# Patient Record
Sex: Male | Born: 1940 | Race: White | Hispanic: No | Marital: Married | Smoking: Former smoker
Health system: Southern US, Community
[De-identification: ages and names within clinical notes are randomized; demographics above are authoritative.]

## PROBLEM LIST (undated history)

## (undated) DIAGNOSIS — E785 Hyperlipidemia, unspecified: Secondary | ICD-10-CM

## (undated) DIAGNOSIS — E039 Hypothyroidism, unspecified: Secondary | ICD-10-CM

## (undated) DIAGNOSIS — J189 Pneumonia, unspecified organism: Secondary | ICD-10-CM

## (undated) DIAGNOSIS — D472 Monoclonal gammopathy: Secondary | ICD-10-CM

## (undated) DIAGNOSIS — R0600 Dyspnea, unspecified: Secondary | ICD-10-CM

## (undated) DIAGNOSIS — K219 Gastro-esophageal reflux disease without esophagitis: Secondary | ICD-10-CM

## (undated) DIAGNOSIS — I10 Essential (primary) hypertension: Secondary | ICD-10-CM

## (undated) DIAGNOSIS — N189 Chronic kidney disease, unspecified: Secondary | ICD-10-CM

## (undated) DIAGNOSIS — I2510 Atherosclerotic heart disease of native coronary artery without angina pectoris: Secondary | ICD-10-CM

## (undated) DIAGNOSIS — I739 Peripheral vascular disease, unspecified: Secondary | ICD-10-CM

## (undated) DIAGNOSIS — K635 Polyp of colon: Secondary | ICD-10-CM

## (undated) DIAGNOSIS — D649 Anemia, unspecified: Secondary | ICD-10-CM

## (undated) DIAGNOSIS — C801 Malignant (primary) neoplasm, unspecified: Secondary | ICD-10-CM

## (undated) DIAGNOSIS — U071 COVID-19: Secondary | ICD-10-CM

## (undated) DIAGNOSIS — I209 Angina pectoris, unspecified: Secondary | ICD-10-CM

## (undated) DIAGNOSIS — M1990 Unspecified osteoarthritis, unspecified site: Secondary | ICD-10-CM

## (undated) HISTORY — DX: Anemia, unspecified: D64.9

## (undated) HISTORY — DX: Peripheral vascular disease, unspecified: I73.9

## (undated) HISTORY — DX: Essential (primary) hypertension: I10

## (undated) HISTORY — DX: Hypercalcemia: E83.52

## (undated) HISTORY — DX: Polyp of colon: K63.5

## (undated) HISTORY — DX: Hyperlipidemia, unspecified: E78.5

## (undated) HISTORY — DX: Unspecified osteoarthritis, unspecified site: M19.90

## (undated) HISTORY — PX: VASCULAR SURGERY: SHX849

## (undated) HISTORY — DX: COVID-19: U07.1

## (undated) HISTORY — PX: BYPASS GRAFT: SHX909

## (undated) HISTORY — DX: Chronic kidney disease, unspecified: N18.9

## (undated) HISTORY — DX: Monoclonal gammopathy: D47.2

## (undated) HISTORY — PX: OTHER SURGICAL HISTORY: SHX169

## (undated) SURGERY — Surgical Case
Anesthesia: *Unknown

---

## 2004-07-13 ENCOUNTER — Ambulatory Visit: Payer: Self-pay

## 2006-03-29 ENCOUNTER — Ambulatory Visit: Payer: Self-pay | Admitting: Gastroenterology

## 2006-05-09 ENCOUNTER — Ambulatory Visit: Payer: Self-pay | Admitting: Gastroenterology

## 2006-06-18 ENCOUNTER — Ambulatory Visit (HOSPITAL_COMMUNITY): Admission: RE | Admit: 2006-06-18 | Discharge: 2006-06-18 | Payer: Self-pay | Admitting: *Deleted

## 2006-07-31 ENCOUNTER — Inpatient Hospital Stay: Payer: Self-pay | Admitting: Vascular Surgery

## 2006-12-25 ENCOUNTER — Inpatient Hospital Stay: Payer: Self-pay | Admitting: Vascular Surgery

## 2007-06-19 ENCOUNTER — Ambulatory Visit: Payer: Self-pay | Admitting: Gastroenterology

## 2007-08-20 ENCOUNTER — Ambulatory Visit: Payer: Self-pay | Admitting: Vascular Surgery

## 2007-11-26 ENCOUNTER — Inpatient Hospital Stay: Payer: Self-pay | Admitting: Vascular Surgery

## 2008-03-18 ENCOUNTER — Ambulatory Visit: Payer: Self-pay | Admitting: Vascular Surgery

## 2008-03-25 ENCOUNTER — Inpatient Hospital Stay: Payer: Self-pay | Admitting: Vascular Surgery

## 2008-05-12 DIAGNOSIS — R351 Nocturia: Secondary | ICD-10-CM | POA: Insufficient documentation

## 2008-05-12 DIAGNOSIS — R3911 Hesitancy of micturition: Secondary | ICD-10-CM | POA: Insufficient documentation

## 2008-05-20 ENCOUNTER — Ambulatory Visit: Payer: Self-pay | Admitting: Gastroenterology

## 2008-05-25 ENCOUNTER — Observation Stay: Payer: Self-pay | Admitting: Gastroenterology

## 2008-06-17 ENCOUNTER — Ambulatory Visit: Payer: Self-pay | Admitting: Gastroenterology

## 2009-06-25 ENCOUNTER — Ambulatory Visit: Payer: Self-pay | Admitting: Family Medicine

## 2009-09-20 ENCOUNTER — Ambulatory Visit: Payer: Self-pay | Admitting: Gastroenterology

## 2010-01-02 ENCOUNTER — Ambulatory Visit: Payer: Self-pay | Admitting: Internal Medicine

## 2010-01-11 ENCOUNTER — Ambulatory Visit: Payer: Self-pay | Admitting: Internal Medicine

## 2010-02-02 ENCOUNTER — Ambulatory Visit: Payer: Self-pay | Admitting: Internal Medicine

## 2010-02-14 DIAGNOSIS — R6882 Decreased libido: Secondary | ICD-10-CM | POA: Insufficient documentation

## 2010-09-09 ENCOUNTER — Ambulatory Visit: Payer: BLUE CROSS/BLUE SHIELD

## 2011-01-26 ENCOUNTER — Ambulatory Visit (INDEPENDENT_AMBULATORY_CARE_PROVIDER_SITE_OTHER): Payer: Medicare Other | Admitting: Family Medicine

## 2011-01-26 ENCOUNTER — Encounter: Payer: Self-pay | Admitting: Family Medicine

## 2011-01-26 VITALS — BP 148/74 | HR 74 | Temp 98.2°F | Ht 68.0 in | Wt 155.0 lb

## 2011-01-26 DIAGNOSIS — E7800 Pure hypercholesterolemia, unspecified: Secondary | ICD-10-CM

## 2011-01-26 DIAGNOSIS — I739 Peripheral vascular disease, unspecified: Secondary | ICD-10-CM

## 2011-01-26 DIAGNOSIS — I10 Essential (primary) hypertension: Secondary | ICD-10-CM

## 2011-01-26 DIAGNOSIS — Z95828 Presence of other vascular implants and grafts: Secondary | ICD-10-CM

## 2011-01-26 DIAGNOSIS — D472 Monoclonal gammopathy: Secondary | ICD-10-CM

## 2011-01-26 DIAGNOSIS — Z9889 Other specified postprocedural states: Secondary | ICD-10-CM

## 2011-01-26 DIAGNOSIS — M549 Dorsalgia, unspecified: Secondary | ICD-10-CM

## 2011-01-26 MED ORDER — TRAMADOL HCL 50 MG PO TABS: 50.0000 mg | ORAL_TABLET | Freq: Three times a day (TID) | ORAL | Status: DC | PRN

## 2011-01-26 MED ORDER — FENOFIBRATE 160 MG PO TABS: 160.0000 mg | ORAL_TABLET | Freq: Every day | ORAL | Status: DC

## 2011-01-26 MED ORDER — LISINOPRIL 40 MG PO TABS: 40.0000 mg | ORAL_TABLET | Freq: Every day | ORAL | Status: DC

## 2011-01-26 MED ORDER — AMLODIPINE BESYLATE 5 MG PO TABS: 5.0000 mg | ORAL_TABLET | Freq: Every day | ORAL | Status: DC

## 2011-01-26 MED ORDER — CLOPIDOGREL BISULFATE 75 MG PO TABS: 75.0000 mg | ORAL_TABLET | Freq: Every day | ORAL | Status: DC

## 2011-01-26 NOTE — Patient Instructions (Signed)
Don't change your meds.  I sent your prescriptions to the pharmacy.  Ask [place] about your previous vaccines.   We'll request your other records.  Schedule a fasting lab visit in the near future.  When you get your labs done, you can get your results through our phone system.  Follow the instructions on the blue card. Take care.

## 2011-01-27 ENCOUNTER — Encounter: Payer: Self-pay | Admitting: Family Medicine

## 2011-01-27 NOTE — Assessment & Plan Note (Signed)
As above.

## 2011-01-27 NOTE — Assessment & Plan Note (Signed)
Stable with tramadol.

## 2011-01-27 NOTE — Progress Notes (Signed)
New pt to est care.    Hypertension:    Using medication without problems or lightheadedness: yes Chest pain with exertion:no Edema:minimal, stable and unchanged Short of breath:no  PVD s/p stent and bypass.  No claudication.  No ulceration.  Minimal edema, chronic.   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  S/p PT with improvement.    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  Thought to be insignificant.   Renal function followed by [facility].   Requesting records from mult clinics.   Meds, vitals, and allergies reviewed.   PMH and SH reviewed  ROS: See HPI.  Otherwise negative.    GEN: nad, alert and oriented HEENT: mucous membranes moist NECK: supple w/o LA CV: rrr. PULM: ctab, no inc wob ABD: soft, +bs EXT: trace edema SKIN: no acute rash

## 2011-01-27 NOTE — Assessment & Plan Note (Signed)
No skin breakdown and 1+ DP pulses.  Requesting records.

## 2011-01-27 NOTE — Assessment & Plan Note (Signed)
Per [facility] clinic.  

## 2011-01-27 NOTE — Assessment & Plan Note (Signed)
Requesting records. Return for labs.  BP okay for now.

## 2011-01-27 NOTE — Assessment & Plan Note (Signed)
Requesting records. Return for labs.

## 2011-02-01 ENCOUNTER — Other Ambulatory Visit (INDEPENDENT_AMBULATORY_CARE_PROVIDER_SITE_OTHER): Payer: Medicare Other | Admitting: Family Medicine

## 2011-02-01 ENCOUNTER — Encounter: Payer: Self-pay | Admitting: Family Medicine

## 2011-02-01 DIAGNOSIS — I10 Essential (primary) hypertension: Secondary | ICD-10-CM

## 2011-02-01 LAB — COMPREHENSIVE METABOLIC PANEL
ALT: 9 U/L (ref 0–53)
AST: 16 U/L (ref 0–37)
Albumin: 3.9 g/dL (ref 3.5–5.2)
BUN: 28 mg/dL — ABNORMAL HIGH (ref 6–23)
Calcium: 9.6 mg/dL (ref 8.4–10.5)
Chloride: 107 mEq/L (ref 96–112)
Potassium: 4.8 mEq/L (ref 3.5–5.1)
Sodium: 137 mEq/L (ref 135–145)
Total Protein: 6.5 g/dL (ref 6.0–8.3)

## 2011-02-01 LAB — LIPID PANEL
LDL Cholesterol: 111 mg/dL — ABNORMAL HIGH (ref 0–99)
Total CHOL/HDL Ratio: 3

## 2011-02-13 ENCOUNTER — Encounter: Payer: Self-pay | Admitting: Family Medicine

## 2011-02-13 DIAGNOSIS — K269 Duodenal ulcer, unspecified as acute or chronic, without hemorrhage or perforation: Secondary | ICD-10-CM | POA: Insufficient documentation

## 2011-02-13 DIAGNOSIS — N529 Male erectile dysfunction, unspecified: Secondary | ICD-10-CM | POA: Insufficient documentation

## 2011-02-13 DIAGNOSIS — M1990 Unspecified osteoarthritis, unspecified site: Secondary | ICD-10-CM | POA: Insufficient documentation

## 2011-02-13 DIAGNOSIS — K5790 Diverticulosis of intestine, part unspecified, without perforation or abscess without bleeding: Secondary | ICD-10-CM | POA: Insufficient documentation

## 2011-02-13 DIAGNOSIS — N183 Chronic kidney disease, stage 3 unspecified: Secondary | ICD-10-CM | POA: Insufficient documentation

## 2011-02-13 DIAGNOSIS — I6529 Occlusion and stenosis of unspecified carotid artery: Secondary | ICD-10-CM | POA: Insufficient documentation

## 2011-03-09 ENCOUNTER — Encounter: Payer: Self-pay | Admitting: Family Medicine

## 2011-03-15 DIAGNOSIS — R350 Frequency of micturition: Secondary | ICD-10-CM | POA: Insufficient documentation

## 2011-04-26 ENCOUNTER — Encounter: Payer: Self-pay | Admitting: Family Medicine

## 2011-04-26 ENCOUNTER — Ambulatory Visit (INDEPENDENT_AMBULATORY_CARE_PROVIDER_SITE_OTHER): Payer: Medicare Other | Admitting: Family Medicine

## 2011-04-26 VITALS — BP 126/60 | HR 84 | Temp 98.1°F | Wt 154.0 lb

## 2011-04-26 DIAGNOSIS — I10 Essential (primary) hypertension: Secondary | ICD-10-CM

## 2011-04-26 DIAGNOSIS — E7800 Pure hypercholesterolemia, unspecified: Secondary | ICD-10-CM

## 2011-04-26 DIAGNOSIS — J069 Acute upper respiratory infection, unspecified: Secondary | ICD-10-CM | POA: Insufficient documentation

## 2011-04-26 MED ORDER — BENZONATATE 200 MG PO CAPS: 200.0000 mg | ORAL_CAPSULE | Freq: Three times a day (TID) | ORAL | Status: AC | PRN

## 2011-04-26 NOTE — Assessment & Plan Note (Signed)
Likely viral and benign exam.  Nontoxic.  Supportive tx and f/u prn.  ddx d/w pt.

## 2011-04-26 NOTE — Assessment & Plan Note (Signed)
Controlled , no change in meds 

## 2011-04-26 NOTE — Progress Notes (Signed)
Hypertension:    Using medication without problems or lightheadedness: yes Chest pain with exertion:no Edema:no Short of breath:no  CKD per renal.  Recent labs noted.   Occ claudication in R calf.  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  He can walk up to , sometimes less. Pain resolved with rest and then he can restart.  No recent change in sx.   URI sx last few days.  Occ aches and cough, but no fevers.  Didn't sleep well last night.  Sx started recently.  Mild throat irritation and tickle in throat.    Meds, vitals, and allergies reviewed.   PMH and SH reviewed  ROS: See HPI.  Otherwise negative.    GEN: nad, alert and oriented HEENT: mucous membranes moist, tm w/o erythema, nasal exam w/o erythema, clear discharge noted,  OP with cobblestoning NECK: supple w/o LA CV: rrr.   PULM: ctab, no inc wob EXT: no edema SKIN: no acute rash

## 2011-04-26 NOTE — Patient Instructions (Signed)
Don't change your regular meds for now.  Drink plenty of fluids, take tylenol as needed, and gargle with warm salt water for your throat.  This should gradually improve.  Take care.  Let us know if you have other concerns.  You can take tessalon for the cough.   30 min visit in 12/12 with labs ahead of time.

## 2011-04-28 ENCOUNTER — Encounter: Payer: Self-pay | Admitting: Family Medicine

## 2011-04-28 DIAGNOSIS — N400 Enlarged prostate without lower urinary tract symptoms: Secondary | ICD-10-CM | POA: Insufficient documentation

## 2011-04-28 DIAGNOSIS — N138 Other obstructive and reflux uropathy: Secondary | ICD-10-CM | POA: Insufficient documentation

## 2011-08-09 ENCOUNTER — Other Ambulatory Visit (INDEPENDENT_AMBULATORY_CARE_PROVIDER_SITE_OTHER): Payer: Medicare Other

## 2011-08-09 DIAGNOSIS — E7800 Pure hypercholesterolemia, unspecified: Secondary | ICD-10-CM

## 2011-08-09 LAB — HEPATIC FUNCTION PANEL
AST: 17 U/L (ref 0–37)
Albumin: 4.2 g/dL (ref 3.5–5.2)
Alkaline Phosphatase: 25 U/L — ABNORMAL LOW (ref 39–117)
Bilirubin, Direct: 0 mg/dL (ref 0.0–0.3)
Total Protein: 7 g/dL (ref 6.0–8.3)

## 2011-08-09 LAB — LIPID PANEL
Cholesterol: 199 mg/dL (ref 0–200)
LDL Cholesterol: 134 mg/dL — ABNORMAL HIGH (ref 0–99)
Triglycerides: 114 mg/dL (ref 0.0–149.0)

## 2011-08-14 ENCOUNTER — Encounter: Payer: Self-pay | Admitting: Family Medicine

## 2011-08-14 ENCOUNTER — Ambulatory Visit (INDEPENDENT_AMBULATORY_CARE_PROVIDER_SITE_OTHER): Payer: Medicare Other | Admitting: Family Medicine

## 2011-08-14 DIAGNOSIS — I10 Essential (primary) hypertension: Secondary | ICD-10-CM

## 2011-08-14 DIAGNOSIS — I739 Peripheral vascular disease, unspecified: Secondary | ICD-10-CM

## 2011-08-14 DIAGNOSIS — E7800 Pure hypercholesterolemia, unspecified: Secondary | ICD-10-CM

## 2011-08-14 NOTE — Assessment & Plan Note (Signed)
Continue current meds.  We talked about options.  He doesn't want to start statin yet.  LDL need addressing.  Will send him to nutrition and then recheck LDL with next next of labs, ie 2013.  He agrees.  He hasn't had adequate diet/lifestyle trial.

## 2011-08-14 NOTE — Progress Notes (Signed)
Elevated Cholesterol: Using medications without problems:yes Muscle aches: no, except as below Diet compliance: no Exercise: yes, some Other complaints:no His wife doesn't cook and he's eating out.  We talked about diet.   Hypertension:    Using medication without problems or lightheadedness: yes Chest pain with exertion:no Edema:no Short of breath:no Average home BPs: similar to today  PVD with L leg bypass.  We talked about labs today.  Not smoking.  Minimal R calf pain/fatigue with prolonged walking.  Has had f/u with vascular clinic.  No skin breakdown in the legs.   Meds, vitals, and allergies reviewed.   PMH and SH reviewed  ROS: See HPI.  Otherwise negative.    GEN: nad, alert and oriented HEENT: mucous membranes moist NECK: supple w/o LA CV: rrr. PULM: ctab, no inc wob ABD: soft, +bs EXT: no edema SKIN: no acute rash 1+ DP pulse on the L foot, slightly decreased on the R compared to L but intact No skin breakdown, no ulceration on feet

## 2011-08-14 NOTE — Assessment & Plan Note (Signed)
D/w pt about PVD in terms of lipid, BP, tobacco and he understood.  Work on Patent attorney.

## 2011-08-14 NOTE — Assessment & Plan Note (Signed)
Continue current meds 

## 2011-08-14 NOTE — Patient Instructions (Signed)
See Shirlee Limerick about your referral before you leave today.  Check with your insurance before you go to the nutrition visit.   Don't change your meds.  I want to recheck your labs in 6 months at a physical.   If you get a cut/blister or other changes on your feet, then you need to notify a doctor.   Take care.

## 2011-08-23 ENCOUNTER — Ambulatory Visit: Payer: BLUE CROSS/BLUE SHIELD | Admitting: Family Medicine

## 2011-09-05 ENCOUNTER — Ambulatory Visit: Payer: Self-pay | Admitting: Family Medicine

## 2011-09-05 DIAGNOSIS — Z713 Dietary counseling and surveillance: Secondary | ICD-10-CM | POA: Diagnosis not present

## 2011-09-05 DIAGNOSIS — E785 Hyperlipidemia, unspecified: Secondary | ICD-10-CM | POA: Diagnosis not present

## 2011-09-05 DIAGNOSIS — Z7182 Exercise counseling: Secondary | ICD-10-CM | POA: Diagnosis not present

## 2011-10-02 DIAGNOSIS — I10 Essential (primary) hypertension: Secondary | ICD-10-CM | POA: Diagnosis not present

## 2011-10-02 DIAGNOSIS — N183 Chronic kidney disease, stage 3 unspecified: Secondary | ICD-10-CM | POA: Diagnosis not present

## 2011-10-06 ENCOUNTER — Ambulatory Visit: Payer: Self-pay | Admitting: Family Medicine

## 2011-10-09 DIAGNOSIS — N183 Chronic kidney disease, stage 3 unspecified: Secondary | ICD-10-CM | POA: Diagnosis not present

## 2011-10-27 DIAGNOSIS — E785 Hyperlipidemia, unspecified: Secondary | ICD-10-CM | POA: Diagnosis not present

## 2011-10-27 DIAGNOSIS — I701 Atherosclerosis of renal artery: Secondary | ICD-10-CM | POA: Diagnosis not present

## 2011-10-27 DIAGNOSIS — I10 Essential (primary) hypertension: Secondary | ICD-10-CM | POA: Diagnosis not present

## 2011-10-27 DIAGNOSIS — I70219 Atherosclerosis of native arteries of extremities with intermittent claudication, unspecified extremity: Secondary | ICD-10-CM | POA: Diagnosis not present

## 2011-10-30 ENCOUNTER — Telehealth: Payer: Self-pay | Admitting: *Deleted

## 2011-10-30 NOTE — Telephone Encounter (Signed)
Ryan Pittman  [phone].  Patient's cell number.  He is going to see UC today because of his BP being so high but would like to establish with you if possible at the next available time.

## 2011-10-30 NOTE — Telephone Encounter (Signed)
Mr. Dillenbeck is a patient of yours now from [facility]. Mr. Kurtenbach wife called saying that their son has HBP and because he has heard his Dad "sing your praises", he would really like to see you as a NP.  It is the understanding that you are no longer taking NP's at this time.  Since this is a family member of one of your patients, would you be willing to make an exception?

## 2011-10-31 NOTE — Telephone Encounter (Signed)
LMOVM of Mom's cell phone.

## 2011-10-31 NOTE — Telephone Encounter (Signed)
Yes, please.  Get him a OV whenever possible.  Thanks.

## 2012-02-06 ENCOUNTER — Other Ambulatory Visit: Payer: Self-pay | Admitting: *Deleted

## 2012-02-06 MED ORDER — CLOPIDOGREL BISULFATE 75 MG PO TABS: 75.0000 mg | ORAL_TABLET | Freq: Every day | ORAL | Status: DC

## 2012-02-09 DIAGNOSIS — N183 Chronic kidney disease, stage 3 unspecified: Secondary | ICD-10-CM | POA: Diagnosis not present

## 2012-02-09 DIAGNOSIS — N039 Chronic nephritic syndrome with unspecified morphologic changes: Secondary | ICD-10-CM | POA: Diagnosis not present

## 2012-02-09 DIAGNOSIS — D631 Anemia in chronic kidney disease: Secondary | ICD-10-CM | POA: Diagnosis not present

## 2012-02-09 DIAGNOSIS — I10 Essential (primary) hypertension: Secondary | ICD-10-CM | POA: Diagnosis not present

## 2012-02-09 DIAGNOSIS — D472 Monoclonal gammopathy: Secondary | ICD-10-CM | POA: Diagnosis not present

## 2012-02-12 ENCOUNTER — Encounter: Payer: Self-pay | Admitting: Family Medicine

## 2012-02-19 ENCOUNTER — Other Ambulatory Visit: Payer: Self-pay | Admitting: Family Medicine

## 2012-02-27 ENCOUNTER — Other Ambulatory Visit: Payer: Self-pay | Admitting: Family Medicine

## 2012-02-27 DIAGNOSIS — Z85828 Personal history of other malignant neoplasm of skin: Secondary | ICD-10-CM | POA: Diagnosis not present

## 2012-02-27 DIAGNOSIS — D239 Other benign neoplasm of skin, unspecified: Secondary | ICD-10-CM | POA: Diagnosis not present

## 2012-03-01 ENCOUNTER — Other Ambulatory Visit: Payer: Self-pay | Admitting: Family Medicine

## 2012-03-01 DIAGNOSIS — I10 Essential (primary) hypertension: Secondary | ICD-10-CM

## 2012-03-04 ENCOUNTER — Other Ambulatory Visit: Payer: Self-pay | Admitting: Family Medicine

## 2012-03-04 NOTE — Telephone Encounter (Signed)
Electronic refill request.  Please advise. 

## 2012-03-04 NOTE — Telephone Encounter (Signed)
Sent!

## 2012-03-05 ENCOUNTER — Other Ambulatory Visit (INDEPENDENT_AMBULATORY_CARE_PROVIDER_SITE_OTHER): Payer: Medicare Other

## 2012-03-05 DIAGNOSIS — I10 Essential (primary) hypertension: Secondary | ICD-10-CM

## 2012-03-05 LAB — LIPID PANEL
Cholesterol: 215 mg/dL — ABNORMAL HIGH (ref 0–200)
HDL: 53.6 mg/dL (ref >39.00)
Triglycerides: 85 mg/dL (ref 0.0–149.0)

## 2012-03-05 LAB — LDL CHOLESTEROL, DIRECT: Direct LDL: 130.1 mg/dL

## 2012-03-05 LAB — COMPREHENSIVE METABOLIC PANEL
Alkaline Phosphatase: 26 U/L — ABNORMAL LOW (ref 39–117)
BUN: 25 mg/dL — ABNORMAL HIGH (ref 6–23)
Creatinine, Ser: 1.4 mg/dL (ref 0.4–1.5)
GFR: 53.24 mL/min — ABNORMAL LOW (ref >60.00)
Glucose, Bld: 88 mg/dL (ref 70–99)
Sodium: 143 mEq/L (ref 135–145)
Total Bilirubin: 0.6 mg/dL (ref 0.3–1.2)
Total Protein: 6.9 g/dL (ref 6.0–8.3)

## 2012-03-12 ENCOUNTER — Encounter: Payer: Self-pay | Admitting: Family Medicine

## 2012-03-12 ENCOUNTER — Ambulatory Visit (INDEPENDENT_AMBULATORY_CARE_PROVIDER_SITE_OTHER): Payer: Medicare Other | Admitting: Family Medicine

## 2012-03-12 VITALS — BP 126/66 | HR 68 | Temp 98.5°F | Ht 65.75 in | Wt 146.8 lb

## 2012-03-12 DIAGNOSIS — Z0000 Encounter for general adult medical examination without abnormal findings: Secondary | ICD-10-CM | POA: Diagnosis not present

## 2012-03-12 DIAGNOSIS — E7800 Pure hypercholesterolemia, unspecified: Secondary | ICD-10-CM | POA: Diagnosis not present

## 2012-03-12 DIAGNOSIS — Z23 Encounter for immunization: Secondary | ICD-10-CM

## 2012-03-12 DIAGNOSIS — I10 Essential (primary) hypertension: Secondary | ICD-10-CM | POA: Diagnosis not present

## 2012-03-12 DIAGNOSIS — I739 Peripheral vascular disease, unspecified: Secondary | ICD-10-CM | POA: Diagnosis not present

## 2012-03-12 MED ORDER — TRAMADOL HCL 50 MG PO TABS: ORAL_TABLET | ORAL | Status: DC

## 2012-03-12 MED ORDER — LISINOPRIL 40 MG PO TABS: 40.0000 mg | ORAL_TABLET | Freq: Every day | ORAL | Status: DC

## 2012-03-12 MED ORDER — CLOPIDOGREL BISULFATE 75 MG PO TABS: 75.0000 mg | ORAL_TABLET | Freq: Every day | ORAL | Status: DC

## 2012-03-12 MED ORDER — AMLODIPINE BESYLATE 5 MG PO TABS: 5.0000 mg | ORAL_TABLET | Freq: Every day | ORAL | Status: DC

## 2012-03-12 MED ORDER — FENOFIBRATE 160 MG PO TABS: 160.0000 mg | ORAL_TABLET | Freq: Every day | ORAL | Status: DC

## 2012-03-12 NOTE — Assessment & Plan Note (Signed)
D/w pt, continue current meds, continue exercise and diet.  

## 2012-03-12 NOTE — Addendum Note (Signed)
Addended by: Sydell Axon C on: 03/12/2012 11:29 AM   Modules accepted: Orders

## 2012-03-12 NOTE — Assessment & Plan Note (Signed)
Dec in claudication, inc in walking distance.  D/w pt, continue current meds, continue exercise and diet.  D/w pt about cessation of tobacco.

## 2012-03-12 NOTE — Assessment & Plan Note (Signed)
D/w pt, continue current meds, continue exercise and diet.

## 2012-03-12 NOTE — Progress Notes (Signed)
I have personally reviewed the Medicare Annual Wellness questionnaire and have noted 1. The patient's medical and social history 2. Their use of alcohol, tobacco or illicit drugs 3. Their current medications and supplements 4. The patient's functional ability including ADL's, fall risks, home safety risks and hearing or visual             impairment. 5. Diet and physical activities 6. Evidence for depression or mood disorders  The patients weight, height, BMI have been recorded in the chart and visual acuity is per eye clinic.  I have made referrals, counseling and provided education to the patient based review of the above and I have provided the pt with a written personalized care plan for preventive services.  See scanned forms.  Routine anticipatory guidance given to patient.  See health maintenance. Tetanus 2013 Flu yearly Shingles 2011 PNA 2011 Colonoscopy 2011 Prostate cancer screening per uro.   Advance directive- he'll check on this.  He thinks he has one.    Hypertension:    Using medication without problems or lightheadedness: yes Chest pain with exertion:no Edema:no Short of breath:no  Elevated Cholesterol: Using medications without problems:yes Muscle aches: no Diet compliance:yes Exercise:yes  PVD.  Still with occ tobacco use.  No leg pain.   Improved claudication- his walking distance is much improved.    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  No FCNAVD.  No rash.    PMH and SH reviewed  Meds, vitals, and allergies reviewed.   ROS: See HPI.  Otherwise negative.    GEN: nad, alert and oriented HEENT: mucous membranes moist NECK: supple w/o LA CV: rrr. PULM: ctab, no inc wob ABD: soft, +bs EXT: no edema, 1+ DP pulses SKIN: no acute rash

## 2012-03-12 NOTE — Patient Instructions (Addendum)
Don't change your meds.  Take care.  Glad to see you.  I would cut out all tobacco use.  I would get a flu shot each fall.

## 2012-04-25 ENCOUNTER — Telehealth: Payer: Self-pay

## 2012-04-25 DIAGNOSIS — M79606 Pain in leg, unspecified: Secondary | ICD-10-CM

## 2012-04-25 NOTE — Telephone Encounter (Signed)
Pt had been seeing Dr Kathi Ludwig about knee pain; no longer practicing. Pt needs referral to [facility] pain clinic Dr Eino Farber for right knee pain.Please advise.

## 2012-04-25 NOTE — Telephone Encounter (Signed)
Referral placed.  I believe that would be Dr. Laban Emperor.

## 2012-04-26 DIAGNOSIS — H2510 Age-related nuclear cataract, unspecified eye: Secondary | ICD-10-CM | POA: Diagnosis not present

## 2012-04-29 ENCOUNTER — Telehealth: Payer: Self-pay | Admitting: *Deleted

## 2012-04-29 NOTE — Telephone Encounter (Signed)
Error

## 2012-04-30 DIAGNOSIS — N401 Enlarged prostate with lower urinary tract symptoms: Secondary | ICD-10-CM | POA: Diagnosis not present

## 2012-04-30 DIAGNOSIS — N138 Other obstructive and reflux uropathy: Secondary | ICD-10-CM | POA: Diagnosis not present

## 2012-04-30 DIAGNOSIS — R351 Nocturia: Secondary | ICD-10-CM | POA: Diagnosis not present

## 2012-04-30 DIAGNOSIS — R350 Frequency of micturition: Secondary | ICD-10-CM | POA: Diagnosis not present

## 2012-05-07 ENCOUNTER — Encounter: Payer: Self-pay | Admitting: Family Medicine

## 2012-05-07 ENCOUNTER — Ambulatory Visit (INDEPENDENT_AMBULATORY_CARE_PROVIDER_SITE_OTHER): Payer: Medicare Other | Admitting: Family Medicine

## 2012-05-07 ENCOUNTER — Telehealth: Payer: Self-pay

## 2012-05-07 VITALS — BP 120/60 | HR 84 | Temp 99.0°F | Wt 149.0 lb

## 2012-05-07 DIAGNOSIS — M1990 Unspecified osteoarthritis, unspecified site: Secondary | ICD-10-CM

## 2012-05-07 MED ORDER — HYDROCODONE-ACETAMINOPHEN 5-325 MG PO TABS: 1.0000 | ORAL_TABLET | Freq: Every evening | ORAL | Status: AC | PRN

## 2012-05-07 NOTE — Telephone Encounter (Signed)
[employer] called status of form; was faxed late 05/03/12.

## 2012-05-07 NOTE — Progress Notes (Signed)
His R knee is continuing to hurt.  Popping with walking and pain at night (with repositioning in the bed).  Prev injected, last injection was months ago.  He's had at least 2, likely 3 injections in his R knee.  He wanted to cancel the pain clinic eval; discussed and he'll cancel this.  H/o GIB on nsaids.  No h/o sig constipation.    Recently with cough and congestion.  No fevers.  Some sputum.    Meds, vitals, and allergies reviewed.   ROS: See HPI.  Otherwise, noncontributory.  GEN: nad, alert and oriented HEENT: mucous membranes moist, tm w/o erythema, nasal exam w/o erythema, clear discharge noted,  OP with cobblestoning NECK: supple w/o LA CV: rrr.   PULM: ctab, no inc wob EXT: no edema SKIN: no acute rash R knee with medial joint line pain but no erythema or effusion. No clicking or locking on ROM.  No laxity on testing.

## 2012-05-07 NOTE — Telephone Encounter (Signed)
pts daughter,Kim left v/m pt in extreme pain and having difficulty walking due to knee pain. Pt has not heard from referral to pain clinic and needs to see someone today; pt is willing to go to [facility] if necessary.Please advise.

## 2012-05-07 NOTE — Telephone Encounter (Signed)
This note was for another pt. Please ignore.

## 2012-05-07 NOTE — Patient Instructions (Addendum)
Go see Shirlee Limerick and get the phone number to call and cancel the pain clinic visit.  Take the vicodin at night.  It can make you drowsy.  If you have any constipation, start taking a stool softener or laxative with it.  Take care.  If not improved, then we'll set you up with ortho.

## 2012-05-07 NOTE — Telephone Encounter (Signed)
Will see pt today.  

## 2012-05-07 NOTE — Telephone Encounter (Signed)
Ryan Pittman advised to send to PCP; Ryan Pittman has spoken previously with pt and explained can take weeks to get appt with pain clinic.Please advise.

## 2012-05-08 NOTE — Assessment & Plan Note (Signed)
With medial knee pain.  D/w pt about options.  Avoid nsaids.  Night pain is bothersome, disrupts sleep.  Use vicodin with sedation/constipation caution at night.  If worsening, we'll set up ortho eval.  He agrees.  Also with benign URI exam that should self resolve,  Likely viral.  Discussed.

## 2012-05-10 DIAGNOSIS — I70229 Atherosclerosis of native arteries of extremities with rest pain, unspecified extremity: Secondary | ICD-10-CM | POA: Diagnosis not present

## 2012-05-10 DIAGNOSIS — I10 Essential (primary) hypertension: Secondary | ICD-10-CM | POA: Diagnosis not present

## 2012-05-10 DIAGNOSIS — I70219 Atherosclerosis of native arteries of extremities with intermittent claudication, unspecified extremity: Secondary | ICD-10-CM | POA: Diagnosis not present

## 2012-05-10 DIAGNOSIS — I701 Atherosclerosis of renal artery: Secondary | ICD-10-CM | POA: Diagnosis not present

## 2012-05-31 DIAGNOSIS — I10 Essential (primary) hypertension: Secondary | ICD-10-CM | POA: Diagnosis not present

## 2012-05-31 DIAGNOSIS — N039 Chronic nephritic syndrome with unspecified morphologic changes: Secondary | ICD-10-CM | POA: Diagnosis not present

## 2012-05-31 DIAGNOSIS — D472 Monoclonal gammopathy: Secondary | ICD-10-CM | POA: Diagnosis not present

## 2012-05-31 DIAGNOSIS — N183 Chronic kidney disease, stage 3 unspecified: Secondary | ICD-10-CM | POA: Diagnosis not present

## 2012-05-31 DIAGNOSIS — D631 Anemia in chronic kidney disease: Secondary | ICD-10-CM | POA: Diagnosis not present

## 2012-06-03 ENCOUNTER — Encounter: Payer: Self-pay | Admitting: Family Medicine

## 2012-06-11 DIAGNOSIS — Z23 Encounter for immunization: Secondary | ICD-10-CM | POA: Diagnosis not present

## 2012-06-12 DIAGNOSIS — N529 Male erectile dysfunction, unspecified: Secondary | ICD-10-CM | POA: Diagnosis not present

## 2012-07-09 ENCOUNTER — Ambulatory Visit: Payer: Self-pay | Admitting: Nephrology

## 2012-08-13 DIAGNOSIS — D472 Monoclonal gammopathy: Secondary | ICD-10-CM | POA: Diagnosis not present

## 2012-10-09 DIAGNOSIS — Z86010 Personal history of colonic polyps: Secondary | ICD-10-CM | POA: Diagnosis not present

## 2012-11-14 DIAGNOSIS — I10 Essential (primary) hypertension: Secondary | ICD-10-CM | POA: Diagnosis not present

## 2012-11-14 DIAGNOSIS — D631 Anemia in chronic kidney disease: Secondary | ICD-10-CM | POA: Diagnosis not present

## 2012-11-14 DIAGNOSIS — N183 Chronic kidney disease, stage 3 unspecified: Secondary | ICD-10-CM | POA: Diagnosis not present

## 2012-11-26 ENCOUNTER — Ambulatory Visit (INDEPENDENT_AMBULATORY_CARE_PROVIDER_SITE_OTHER): Payer: Medicare Other | Admitting: Family Medicine

## 2012-11-26 ENCOUNTER — Encounter: Payer: Self-pay | Admitting: Family Medicine

## 2012-11-26 VITALS — BP 130/60 | HR 96 | Temp 98.8°F | Wt 156.0 lb

## 2012-11-26 DIAGNOSIS — E7800 Pure hypercholesterolemia, unspecified: Secondary | ICD-10-CM | POA: Diagnosis not present

## 2012-11-26 DIAGNOSIS — J069 Acute upper respiratory infection, unspecified: Secondary | ICD-10-CM

## 2012-11-26 MED ORDER — PRAVASTATIN SODIUM 20 MG PO TABS: 20.0000 mg | ORAL_TABLET | Freq: Every day | ORAL | Status: DC

## 2012-11-26 NOTE — Patient Instructions (Addendum)
If the cough gets worse, if you have a lot more sputum, or if you get short of breath, then let me know.  When the cold gets better, add on pravastatin 20mg  a day.  If you aches, cut back to 10mg  a day.  If you can tolerate it, then we'll need to schedule repeat fasting cholesterol testing.

## 2012-11-26 NOTE — Progress Notes (Signed)
Started about 1 week ago with nasal congestion and sneezing.  Taking OTC mucinex w/o any relief.  No fevers.  Some cough, mild.  occ sputum, less now than prev.  B ear pain.  No facial pain.  Overall he thought he was getting better but didn't feel great this AM.  "I think I'm better than I was."  No ST.  Not SOB. No BLE edema.  Mult sick contacts at work.    HLD.  We talked about options.  He would like to try a different statin.  This is reasonable.  See plan.    Meds, vitals, and allergies reviewed.   ROS: See HPI.  Otherwise, noncontributory.  GEN: nad, alert and oriented HEENT: mucous membranes moist, tm w/o erythema, nasal exam w/o erythema, clear discharge noted,  OP with cobblestoning, sinuses not ttp. NECK: supple w/o LA CV: rrr.   PULM: ctab, no inc wob EXT: no edema SKIN: no acute rash

## 2012-11-27 DIAGNOSIS — J069 Acute upper respiratory infection, unspecified: Secondary | ICD-10-CM | POA: Insufficient documentation

## 2012-11-27 NOTE — Assessment & Plan Note (Signed)
Likely viral, nontoxic, fu prn.  Supportive tx.

## 2012-11-27 NOTE — Assessment & Plan Note (Signed)
Will attempt to add on prava.  If aches, but back to 10mg , it still with aches the stop.  If tolerated, he'll notify and we'll set up repeat lipids.

## 2012-11-28 ENCOUNTER — Encounter: Payer: Self-pay | Admitting: Family Medicine

## 2012-11-28 LAB — CHOLESTEROL, TOTAL
Creat: 1.62
Glucose: 92
HDL: 57 mg/dL (ref 35–70)
Triglycerides: 130

## 2012-12-09 ENCOUNTER — Encounter: Payer: Self-pay | Admitting: Family Medicine

## 2012-12-24 DIAGNOSIS — I70219 Atherosclerosis of native arteries of extremities with intermittent claudication, unspecified extremity: Secondary | ICD-10-CM | POA: Diagnosis not present

## 2012-12-24 DIAGNOSIS — I6529 Occlusion and stenosis of unspecified carotid artery: Secondary | ICD-10-CM | POA: Diagnosis not present

## 2012-12-24 DIAGNOSIS — I701 Atherosclerosis of renal artery: Secondary | ICD-10-CM | POA: Diagnosis not present

## 2012-12-24 DIAGNOSIS — I10 Essential (primary) hypertension: Secondary | ICD-10-CM | POA: Diagnosis not present

## 2013-01-02 ENCOUNTER — Encounter: Payer: Self-pay | Admitting: Family Medicine

## 2013-03-06 DIAGNOSIS — I10 Essential (primary) hypertension: Secondary | ICD-10-CM | POA: Diagnosis not present

## 2013-03-06 DIAGNOSIS — N183 Chronic kidney disease, stage 3 unspecified: Secondary | ICD-10-CM | POA: Diagnosis not present

## 2013-03-17 DIAGNOSIS — Z85828 Personal history of other malignant neoplasm of skin: Secondary | ICD-10-CM | POA: Diagnosis not present

## 2013-03-17 DIAGNOSIS — D235 Other benign neoplasm of skin of trunk: Secondary | ICD-10-CM | POA: Diagnosis not present

## 2013-03-17 DIAGNOSIS — L570 Actinic keratosis: Secondary | ICD-10-CM | POA: Diagnosis not present

## 2013-03-28 ENCOUNTER — Other Ambulatory Visit: Payer: Self-pay | Admitting: *Deleted

## 2013-03-28 MED ORDER — FENOFIBRATE 160 MG PO TABS: 160.0000 mg | ORAL_TABLET | Freq: Every day | ORAL | Status: DC

## 2013-03-28 MED ORDER — LISINOPRIL 40 MG PO TABS: 40.0000 mg | ORAL_TABLET | Freq: Every day | ORAL | Status: DC

## 2013-03-28 NOTE — Telephone Encounter (Signed)
Last filled 02/06/13

## 2013-03-30 MED ORDER — TRAMADOL HCL 50 MG PO TABS: ORAL_TABLET | ORAL | Status: DC

## 2013-03-30 NOTE — Telephone Encounter (Signed)
Sent, schedule a physical.  Thanks.

## 2013-03-30 NOTE — Telephone Encounter (Signed)
Correction, please call in tramadol and schedule a physical.  Thanks.

## 2013-03-31 NOTE — Telephone Encounter (Signed)
Medication phoned to pharmacy. Left detailed message on voicemail to schedule CPE. 

## 2013-04-02 ENCOUNTER — Other Ambulatory Visit: Payer: Self-pay | Admitting: Family Medicine

## 2013-04-02 ENCOUNTER — Other Ambulatory Visit (INDEPENDENT_AMBULATORY_CARE_PROVIDER_SITE_OTHER): Payer: Medicare Other

## 2013-04-02 DIAGNOSIS — E7800 Pure hypercholesterolemia, unspecified: Secondary | ICD-10-CM

## 2013-04-02 LAB — COMPREHENSIVE METABOLIC PANEL
ALT: 11 U/L (ref 0–53)
CO2: 25 mEq/L (ref 19–32)
Creatinine, Ser: 1.3 mg/dL (ref 0.4–1.5)
GFR: 57.35 mL/min — ABNORMAL LOW (ref >60.00)
Total Bilirubin: 0.7 mg/dL (ref 0.3–1.2)

## 2013-04-02 LAB — LIPID PANEL
Cholesterol: 216 mg/dL — ABNORMAL HIGH (ref 0–200)
HDL: 44 mg/dL (ref >39.00)
VLDL: 21.6 mg/dL (ref 0.0–40.0)

## 2013-04-08 ENCOUNTER — Encounter: Payer: Medicare Other | Admitting: Family Medicine

## 2013-04-18 ENCOUNTER — Other Ambulatory Visit: Payer: Self-pay | Admitting: Family Medicine

## 2013-04-23 DIAGNOSIS — N401 Enlarged prostate with lower urinary tract symptoms: Secondary | ICD-10-CM | POA: Diagnosis not present

## 2013-04-23 DIAGNOSIS — R351 Nocturia: Secondary | ICD-10-CM | POA: Diagnosis not present

## 2013-04-23 DIAGNOSIS — N138 Other obstructive and reflux uropathy: Secondary | ICD-10-CM | POA: Diagnosis not present

## 2013-04-23 DIAGNOSIS — N529 Male erectile dysfunction, unspecified: Secondary | ICD-10-CM | POA: Diagnosis not present

## 2013-04-28 DIAGNOSIS — H2510 Age-related nuclear cataract, unspecified eye: Secondary | ICD-10-CM | POA: Diagnosis not present

## 2013-04-29 DIAGNOSIS — H5340 Unspecified visual field defects: Secondary | ICD-10-CM | POA: Diagnosis not present

## 2013-04-29 DIAGNOSIS — H02839 Dermatochalasis of unspecified eye, unspecified eyelid: Secondary | ICD-10-CM | POA: Diagnosis not present

## 2013-05-07 ENCOUNTER — Other Ambulatory Visit: Payer: Self-pay | Admitting: Family Medicine

## 2013-05-27 DIAGNOSIS — M79609 Pain in unspecified limb: Secondary | ICD-10-CM | POA: Diagnosis not present

## 2013-05-27 DIAGNOSIS — I70219 Atherosclerosis of native arteries of extremities with intermittent claudication, unspecified extremity: Secondary | ICD-10-CM | POA: Diagnosis not present

## 2013-05-27 DIAGNOSIS — I739 Peripheral vascular disease, unspecified: Secondary | ICD-10-CM | POA: Diagnosis not present

## 2013-05-27 DIAGNOSIS — I701 Atherosclerosis of renal artery: Secondary | ICD-10-CM | POA: Diagnosis not present

## 2013-05-29 DIAGNOSIS — N138 Other obstructive and reflux uropathy: Secondary | ICD-10-CM | POA: Diagnosis not present

## 2013-05-29 DIAGNOSIS — N401 Enlarged prostate with lower urinary tract symptoms: Secondary | ICD-10-CM | POA: Diagnosis not present

## 2013-06-03 DIAGNOSIS — H02419 Mechanical ptosis of unspecified eyelid: Secondary | ICD-10-CM | POA: Diagnosis not present

## 2013-06-17 ENCOUNTER — Encounter: Payer: Self-pay | Admitting: Family Medicine

## 2013-06-17 ENCOUNTER — Ambulatory Visit (INDEPENDENT_AMBULATORY_CARE_PROVIDER_SITE_OTHER): Payer: Medicare Other | Admitting: Family Medicine

## 2013-06-17 VITALS — BP 128/70 | HR 68 | Temp 97.8°F | Ht 65.0 in | Wt 151.0 lb

## 2013-06-17 DIAGNOSIS — Z0000 Encounter for general adult medical examination without abnormal findings: Secondary | ICD-10-CM | POA: Diagnosis not present

## 2013-06-17 DIAGNOSIS — E7800 Pure hypercholesterolemia, unspecified: Secondary | ICD-10-CM

## 2013-06-17 DIAGNOSIS — I10 Essential (primary) hypertension: Secondary | ICD-10-CM

## 2013-06-17 DIAGNOSIS — G4700 Insomnia, unspecified: Secondary | ICD-10-CM

## 2013-06-17 DIAGNOSIS — Z23 Encounter for immunization: Secondary | ICD-10-CM | POA: Diagnosis not present

## 2013-06-17 NOTE — Progress Notes (Signed)
I have personally reviewed the Medicare Annual Wellness questionnaire and have noted 1. The patient's medical and social history 2. Their use of alcohol, tobacco or illicit drugs 3. Their current medications and supplements 4. The patient's functional ability including ADL's, fall risks, home safety risks and hearing or visual             impairment. 5. Diet and physical activities 6. Evidence for depression or mood disorders  The patients weight, height, BMI have been recorded in the chart and visual acuity is per eye clinic.  I have made referrals, counseling and provided education to the patient based review of the above and I have provided the pt with a written personalized care plan for preventive services.  See scanned forms.  Routine anticipatory guidance given to patient.  See health maintenance. Flu to be done at pharmacy Shingles 2013 PNA 2011 Tetanus 2013 Colon 2011 per patient report.  Prostate cancer screening per uro Advance directive encouraged.  Wife designated.  Cognitive function addressed- see scanned forms- and if abnormal then additional documentation follows.   Occ insomnia. Not OTC meds tired.  Caffeine- in AM, 3-4 cups of coffee.  occ in evening.  No sodas.    Elevated Cholesterol: Using medications without problems:t Muscle aches: n Diet compliance:y Exercise:y  Hypertension:    Using medication without problems or lightheadedness: y Chest pain with exertion:n Edema:n Short of breath:n  PMH and SH reviewed  Meds, vitals, and allergies reviewed.   ROS: See HPI.  Otherwise negative.    GEN: nad, alert and oriented HEENT: mucous membranes moist NECK: supple w/o LA CV: rrr. PULM: ctab, no inc wob ABD: soft, +bs EXT: no edema SKIN: no acute rash

## 2013-06-17 NOTE — Patient Instructions (Signed)
Try to cut back on coffee.  If you still have trouble sleeping, try OTC unisom or melatonin.   If not better, then let me know.  Keep taking the pravastatin.  If you have more aches, then let me know.  Glad to see you.  Take care.  Get a flu shot at the pharmacy.

## 2013-06-18 DIAGNOSIS — G4700 Insomnia, unspecified: Secondary | ICD-10-CM | POA: Insufficient documentation

## 2013-06-18 NOTE — Assessment & Plan Note (Signed)
No OTC meds tried yet. Caffeine- in AM, 3-4 cups of coffee. Occ in evening. No sodas. Discussed cutting back caffeine, then trying OTC meds.  Call back as needed.

## 2013-06-18 NOTE — Assessment & Plan Note (Signed)
Labs d/w pt.  Likely on max dose of tolerated statin, continue to work on diet/exercise/weight.

## 2013-06-18 NOTE — Assessment & Plan Note (Signed)
Controlled, continue as is.  

## 2013-06-18 NOTE — Assessment & Plan Note (Signed)
See scanned forms.  Routine anticipatory guidance given to patient.  See health maintenance. Flu to be done at pharmacy Shingles 2013 PNA 2011 Tetanus 2013 Colon 2011 per patient report.  Prostate cancer screening per uro Advance directive encouraged.  Wife designated.  Cognitive function addressed- see scanned forms- and if abnormal then additional documentation follows.

## 2013-06-27 DIAGNOSIS — H5340 Unspecified visual field defects: Secondary | ICD-10-CM | POA: Diagnosis not present

## 2013-06-27 DIAGNOSIS — H02839 Dermatochalasis of unspecified eye, unspecified eyelid: Secondary | ICD-10-CM | POA: Diagnosis not present

## 2013-07-22 DIAGNOSIS — M19049 Primary osteoarthritis, unspecified hand: Secondary | ICD-10-CM | POA: Diagnosis not present

## 2013-07-22 DIAGNOSIS — D472 Monoclonal gammopathy: Secondary | ICD-10-CM | POA: Diagnosis not present

## 2013-07-22 DIAGNOSIS — Z79899 Other long term (current) drug therapy: Secondary | ICD-10-CM | POA: Diagnosis not present

## 2013-07-22 DIAGNOSIS — M479 Spondylosis, unspecified: Secondary | ICD-10-CM | POA: Diagnosis not present

## 2013-07-24 DIAGNOSIS — H2510 Age-related nuclear cataract, unspecified eye: Secondary | ICD-10-CM | POA: Diagnosis not present

## 2013-08-05 ENCOUNTER — Other Ambulatory Visit: Payer: Self-pay | Admitting: Family Medicine

## 2013-08-20 DIAGNOSIS — I10 Essential (primary) hypertension: Secondary | ICD-10-CM | POA: Diagnosis not present

## 2013-08-20 DIAGNOSIS — N183 Chronic kidney disease, stage 3 unspecified: Secondary | ICD-10-CM | POA: Diagnosis not present

## 2013-10-29 ENCOUNTER — Other Ambulatory Visit: Payer: Self-pay | Admitting: Family Medicine

## 2013-10-29 NOTE — Telephone Encounter (Signed)
Last filled 09/17/13

## 2013-10-30 MED ORDER — TRAMADOL HCL 50 MG PO TABS: ORAL_TABLET | ORAL | Status: DC

## 2013-10-30 NOTE — Telephone Encounter (Signed)
Please call in.  Thanks.   

## 2013-10-31 NOTE — Telephone Encounter (Signed)
Medication phoned to pharmacy.  

## 2013-11-03 ENCOUNTER — Other Ambulatory Visit: Payer: Self-pay | Admitting: *Deleted

## 2013-11-03 MED ORDER — TRAMADOL HCL 50 MG PO TABS: ORAL_TABLET | ORAL | Status: DC

## 2013-11-03 NOTE — Telephone Encounter (Signed)
Ok to refill 

## 2013-11-03 NOTE — Telephone Encounter (Signed)
Please call in.  Thanks.   

## 2013-11-04 NOTE — Telephone Encounter (Signed)
Rx called to pharmacy as instructed. 

## 2013-11-14 ENCOUNTER — Encounter: Payer: Self-pay | Admitting: Family Medicine

## 2013-11-14 ENCOUNTER — Ambulatory Visit (INDEPENDENT_AMBULATORY_CARE_PROVIDER_SITE_OTHER): Payer: Medicare Other | Admitting: Family Medicine

## 2013-11-14 VITALS — BP 138/62 | HR 80 | Temp 98.2°F | Wt 153.0 lb

## 2013-11-14 DIAGNOSIS — G2581 Restless legs syndrome: Secondary | ICD-10-CM | POA: Diagnosis not present

## 2013-11-14 DIAGNOSIS — M549 Dorsalgia, unspecified: Secondary | ICD-10-CM

## 2013-11-14 DIAGNOSIS — Z862 Personal history of diseases of the blood and blood-forming organs and certain disorders involving the immune mechanism: Secondary | ICD-10-CM

## 2013-11-14 DIAGNOSIS — E7800 Pure hypercholesterolemia, unspecified: Secondary | ICD-10-CM | POA: Diagnosis not present

## 2013-11-14 DIAGNOSIS — G4700 Insomnia, unspecified: Secondary | ICD-10-CM | POA: Diagnosis not present

## 2013-11-14 LAB — CBC WITH DIFFERENTIAL/PLATELET
BASOS ABS: 0 10*3/uL (ref 0.0–0.1)
Basophils Relative: 0.4 % (ref 0.0–3.0)
EOS ABS: 0.1 10*3/uL (ref 0.0–0.7)
Eosinophils Relative: 0.8 % (ref 0.0–5.0)
HCT: 35.7 % — ABNORMAL LOW (ref 39.0–52.0)
Hemoglobin: 11.9 g/dL — ABNORMAL LOW (ref 13.0–17.0)
LYMPHS PCT: 19.5 % (ref 12.0–46.0)
Lymphs Abs: 2 10*3/uL (ref 0.7–4.0)
MCHC: 33.4 g/dL (ref 30.0–36.0)
MCV: 91.9 fl (ref 78.0–100.0)
Monocytes Absolute: 0.9 10*3/uL (ref 0.1–1.0)
Monocytes Relative: 8.7 % (ref 3.0–12.0)
NEUTROS PCT: 70.6 % (ref 43.0–77.0)
Neutro Abs: 7.3 10*3/uL (ref 1.4–7.7)
PLATELETS: 294 10*3/uL (ref 150.0–400.0)
RBC: 3.89 Mil/uL — ABNORMAL LOW (ref 4.22–5.81)
RDW: 14.9 % — AB (ref 11.5–14.6)
WBC: 10.4 10*3/uL (ref 4.5–10.5)

## 2013-11-14 LAB — COMPREHENSIVE METABOLIC PANEL
ALK PHOS: 27 U/L — AB (ref 39–117)
ALT: 12 U/L (ref 0–53)
AST: 13 U/L (ref 0–37)
Albumin: 4.3 g/dL (ref 3.5–5.2)
BILIRUBIN TOTAL: 0.5 mg/dL (ref 0.3–1.2)
BUN: 24 mg/dL — AB (ref 6–23)
CO2: 26 mEq/L (ref 19–32)
Calcium: 9.5 mg/dL (ref 8.4–10.5)
Chloride: 105 mEq/L (ref 96–112)
Creatinine, Ser: 1.3 mg/dL (ref 0.4–1.5)
GFR: 57.76 mL/min — ABNORMAL LOW (ref >60.00)
GLUCOSE: 89 mg/dL (ref 70–99)
Potassium: 4.4 mEq/L (ref 3.5–5.1)
SODIUM: 137 meq/L (ref 135–145)
TOTAL PROTEIN: 7.1 g/dL (ref 6.0–8.3)

## 2013-11-14 LAB — FERRITIN: Ferritin: 78.7 ng/mL (ref 22.0–322.0)

## 2013-11-14 LAB — LIPID PANEL
CHOLESTEROL: 206 mg/dL — AB (ref 0–200)
HDL: 44.3 mg/dL (ref >39.00)
LDL CALC: 133 mg/dL — AB (ref 0–99)
TRIGLYCERIDES: 142 mg/dL (ref 0.0–149.0)
Total CHOL/HDL Ratio: 5
VLDL: 28.4 mg/dL (ref 0.0–40.0)

## 2013-11-14 LAB — TSH: TSH: 1.43 u[IU]/mL (ref 0.35–5.50)

## 2013-11-14 MED ORDER — HYDROCODONE-ACETAMINOPHEN 5-325 MG PO TABS: 1.0000 | ORAL_TABLET | Freq: Four times a day (QID) | ORAL | Status: DC | PRN

## 2013-11-14 NOTE — Progress Notes (Signed)
Pre visit review using our clinic review tool, if applicable. No additional management support is needed unless otherwise documented below in the visit note.  Back pain worse after consecutive golf rounds.  Tramadol didn't help.  Had used vicodin rarely w/o ADE.  Discussed.  Reasonable to continue prn.  Pain is usually near the B SI joints.   Trouble sleeping, even if tired, legs get jumpy.  No leg pain. This isn't new, worse over the last few years.    Elevated Cholesterol: Using medications without problems:yes Muscle aches: no, other than his back Diet compliance: yes, discussed.  Exercise: limited this winter, he's working on this.   Meds, vitals, and allergies reviewed.   ROS: See HPI.  Otherwise, noncontributory.  GEN: nad, alert and oriented HEENT: mucous membranes moist NECK: supple w/o LA CV: rrr.  PULM: ctab, no inc wob ABD: soft, +bs EXT: no edema SKIN: no acute rash Back not currently ttp, SI joints not ttp

## 2013-11-14 NOTE — Patient Instructions (Signed)
Go to the lab on the way out.  We'll contact you with your lab report.  We'll go from there.  Take care.  Keep stretching your back and use the hydrocodone only if needed.  It can make you drowsy.   Glad to see you.

## 2013-11-16 ENCOUNTER — Encounter: Payer: Self-pay | Admitting: Family Medicine

## 2013-11-16 ENCOUNTER — Other Ambulatory Visit: Payer: Self-pay | Admitting: Family Medicine

## 2013-11-16 DIAGNOSIS — G2581 Restless legs syndrome: Secondary | ICD-10-CM | POA: Insufficient documentation

## 2013-11-16 MED ORDER — ROPINIROLE HCL 0.25 MG PO TABS: 0.2500 mg | ORAL_TABLET | Freq: Every day | ORAL | Status: DC

## 2013-11-16 NOTE — Assessment & Plan Note (Signed)
Is worse after consecutive golf rounds. Tramadol didn't help. Had used vicodin rarely w/o ADE. Discussed. Reasonable to continue prn.

## 2013-11-16 NOTE — Assessment & Plan Note (Signed)
Likely, see notes on labs.

## 2013-11-16 NOTE — Assessment & Plan Note (Signed)
See notes on labs. 

## 2013-11-17 ENCOUNTER — Encounter: Payer: Self-pay | Admitting: *Deleted

## 2013-11-28 DIAGNOSIS — M79609 Pain in unspecified limb: Secondary | ICD-10-CM | POA: Diagnosis not present

## 2013-11-28 DIAGNOSIS — I10 Essential (primary) hypertension: Secondary | ICD-10-CM | POA: Diagnosis not present

## 2013-11-28 DIAGNOSIS — I701 Atherosclerosis of renal artery: Secondary | ICD-10-CM | POA: Diagnosis not present

## 2013-11-28 DIAGNOSIS — I70219 Atherosclerosis of native arteries of extremities with intermittent claudication, unspecified extremity: Secondary | ICD-10-CM | POA: Diagnosis not present

## 2013-12-22 DIAGNOSIS — N039 Chronic nephritic syndrome with unspecified morphologic changes: Secondary | ICD-10-CM | POA: Diagnosis not present

## 2013-12-22 DIAGNOSIS — I10 Essential (primary) hypertension: Secondary | ICD-10-CM | POA: Diagnosis not present

## 2013-12-22 DIAGNOSIS — N182 Chronic kidney disease, stage 2 (mild): Secondary | ICD-10-CM | POA: Diagnosis not present

## 2013-12-22 DIAGNOSIS — D631 Anemia in chronic kidney disease: Secondary | ICD-10-CM | POA: Diagnosis not present

## 2014-01-03 ENCOUNTER — Other Ambulatory Visit: Payer: Self-pay | Admitting: Family Medicine

## 2014-01-05 DIAGNOSIS — I10 Essential (primary) hypertension: Secondary | ICD-10-CM | POA: Diagnosis not present

## 2014-01-05 DIAGNOSIS — I70219 Atherosclerosis of native arteries of extremities with intermittent claudication, unspecified extremity: Secondary | ICD-10-CM | POA: Diagnosis not present

## 2014-01-05 DIAGNOSIS — I6529 Occlusion and stenosis of unspecified carotid artery: Secondary | ICD-10-CM | POA: Diagnosis not present

## 2014-01-05 DIAGNOSIS — M549 Dorsalgia, unspecified: Secondary | ICD-10-CM | POA: Diagnosis not present

## 2014-01-13 DIAGNOSIS — I70219 Atherosclerosis of native arteries of extremities with intermittent claudication, unspecified extremity: Secondary | ICD-10-CM | POA: Diagnosis not present

## 2014-01-13 DIAGNOSIS — I6529 Occlusion and stenosis of unspecified carotid artery: Secondary | ICD-10-CM | POA: Diagnosis not present

## 2014-01-13 DIAGNOSIS — M79609 Pain in unspecified limb: Secondary | ICD-10-CM | POA: Diagnosis not present

## 2014-02-01 DIAGNOSIS — J00 Acute nasopharyngitis [common cold]: Secondary | ICD-10-CM | POA: Diagnosis not present

## 2014-02-01 DIAGNOSIS — J209 Acute bronchitis, unspecified: Secondary | ICD-10-CM | POA: Diagnosis not present

## 2014-02-01 DIAGNOSIS — R059 Cough, unspecified: Secondary | ICD-10-CM | POA: Diagnosis not present

## 2014-02-01 DIAGNOSIS — R05 Cough: Secondary | ICD-10-CM | POA: Diagnosis not present

## 2014-03-11 DIAGNOSIS — Z85828 Personal history of other malignant neoplasm of skin: Secondary | ICD-10-CM | POA: Diagnosis not present

## 2014-03-11 DIAGNOSIS — D235 Other benign neoplasm of skin of trunk: Secondary | ICD-10-CM | POA: Diagnosis not present

## 2014-03-11 DIAGNOSIS — R209 Unspecified disturbances of skin sensation: Secondary | ICD-10-CM | POA: Diagnosis not present

## 2014-04-07 DIAGNOSIS — Z23 Encounter for immunization: Secondary | ICD-10-CM | POA: Diagnosis not present

## 2014-04-08 DIAGNOSIS — M542 Cervicalgia: Secondary | ICD-10-CM | POA: Diagnosis not present

## 2014-04-08 DIAGNOSIS — M5030 Other cervical disc degeneration, unspecified cervical region: Secondary | ICD-10-CM | POA: Diagnosis not present

## 2014-04-08 DIAGNOSIS — M47812 Spondylosis without myelopathy or radiculopathy, cervical region: Secondary | ICD-10-CM | POA: Diagnosis not present

## 2014-04-08 DIAGNOSIS — M159 Polyosteoarthritis, unspecified: Secondary | ICD-10-CM | POA: Diagnosis not present

## 2014-04-10 ENCOUNTER — Other Ambulatory Visit: Payer: Self-pay | Admitting: Family Medicine

## 2014-04-22 DIAGNOSIS — M5030 Other cervical disc degeneration, unspecified cervical region: Secondary | ICD-10-CM | POA: Diagnosis not present

## 2014-04-22 DIAGNOSIS — M542 Cervicalgia: Secondary | ICD-10-CM | POA: Diagnosis not present

## 2014-04-22 DIAGNOSIS — R0602 Shortness of breath: Secondary | ICD-10-CM | POA: Insufficient documentation

## 2014-04-22 DIAGNOSIS — R252 Cramp and spasm: Secondary | ICD-10-CM | POA: Insufficient documentation

## 2014-04-22 DIAGNOSIS — R918 Other nonspecific abnormal finding of lung field: Secondary | ICD-10-CM | POA: Diagnosis not present

## 2014-04-22 DIAGNOSIS — M62838 Other muscle spasm: Secondary | ICD-10-CM | POA: Diagnosis not present

## 2014-04-27 ENCOUNTER — Ambulatory Visit (INDEPENDENT_AMBULATORY_CARE_PROVIDER_SITE_OTHER): Payer: Medicare Other | Admitting: Family Medicine

## 2014-04-27 ENCOUNTER — Encounter: Payer: Self-pay | Admitting: Family Medicine

## 2014-04-27 VITALS — BP 130/64 | HR 79 | Temp 98.1°F | Wt 154.8 lb

## 2014-04-27 DIAGNOSIS — R9389 Abnormal findings on diagnostic imaging of other specified body structures: Secondary | ICD-10-CM | POA: Diagnosis not present

## 2014-04-27 NOTE — Progress Notes (Signed)
Pre visit review using our clinic review tool, if applicable. No additional management support is needed unless otherwise documented below in the visit note.  He had seen Dr. Dossie Der about aches in his upper back and neck.  That is some better now.  His neck ROM is better after injection.  Not from Dr. Dossie Der reviewed.   He denied SOB today but it was listed on his chart from Dr. Dossie Der.  Report but not the films reviewed (Reticulonodular opacities at the lung bases, greater on the left. This may reflect an infectious and/or inflammatory process).  H/o possible PNA a few months ago, seen at [facility].  Not SOB now.  No FCNAVD.  No cough.  No sputum.  Former smoker, quit now.  Weight stable overall, up slightly.    SH noted, former smoker  Meds, vitals, and allergies reviewed.   ROS: See HPI.  Otherwise, noncontributory.  GEN: nad, alert and oriented HEENT: mucous membranes moist NECK: supple w/o LA CV: rrr.  no murmur PULM: ctab, no inc wob SKIN: no acute rash

## 2014-04-27 NOTE — Patient Instructions (Signed)
Ryan Pittman will call about your referral for the CAT scan.  We'll go from there.  Take care.

## 2014-04-27 NOTE — Assessment & Plan Note (Addendum)
New problem.  Needs CT.  Normal exam, former smoker, no sx currently.  Will set up CT chest.  D/w pt.  Unclear if old scar tissue vs o/w.  No sign of active process based on exam.

## 2014-04-28 ENCOUNTER — Ambulatory Visit (INDEPENDENT_AMBULATORY_CARE_PROVIDER_SITE_OTHER)
Admission: RE | Admit: 2014-04-28 | Discharge: 2014-04-28 | Disposition: A | Discharge status: Home / Self Care | Payer: Medicare Other | Source: Ambulatory Visit | Attending: Family Medicine | Admitting: Family Medicine

## 2014-04-28 ENCOUNTER — Other Ambulatory Visit: Payer: Self-pay | Admitting: Family Medicine

## 2014-04-28 DIAGNOSIS — R9389 Abnormal findings on diagnostic imaging of other specified body structures: Secondary | ICD-10-CM

## 2014-04-28 DIAGNOSIS — J438 Other emphysema: Secondary | ICD-10-CM | POA: Diagnosis not present

## 2014-04-28 DIAGNOSIS — R911 Solitary pulmonary nodule: Secondary | ICD-10-CM | POA: Diagnosis not present

## 2014-04-28 NOTE — Telephone Encounter (Signed)
Electronic refill request. Not on patient's current meds list but is on historical list.  Please advise.

## 2014-04-29 ENCOUNTER — Other Ambulatory Visit: Payer: Self-pay | Admitting: Family Medicine

## 2014-04-29 DIAGNOSIS — R9389 Abnormal findings on diagnostic imaging of other specified body structures: Secondary | ICD-10-CM

## 2014-04-29 DIAGNOSIS — E041 Nontoxic single thyroid nodule: Secondary | ICD-10-CM | POA: Insufficient documentation

## 2014-04-29 NOTE — Telephone Encounter (Signed)
Please verify this was requested by patient.  I thought it wasn't helpful prev, based on prev conversations from patient.  Thanks.

## 2014-04-29 NOTE — Telephone Encounter (Signed)
Please verify this was requested by patient. I thought it wasn't helpful prev, based on prev conversations from patient. Thanks.

## 2014-04-29 NOTE — Telephone Encounter (Signed)
Spoke to patient and was advised that he did request the refill. Patient stated that he has been taking 1 to 3 per day and is not sure that it is working or not. Patient stated that he was talking about the medication he was given for his legs jumping at night not helping. Patient stated that if you do not want to refill it he will talk with you about it later.

## 2014-04-29 NOTE — Telephone Encounter (Signed)
Received refill request electronically. Last office visit 04/27/14, last refill 03/12/14. Is it okay to refill medication?

## 2014-04-29 NOTE — Telephone Encounter (Signed)
Please call in the tramadol.   He was on a low dose of requip.  If that didn't help, we can try the higher dose.   Let me know. Thanks.

## 2014-04-30 ENCOUNTER — Other Ambulatory Visit: Payer: Self-pay | Admitting: *Deleted

## 2014-04-30 MED ORDER — ROPINIROLE HCL 0.5 MG PO TABS: 0.5000 mg | ORAL_TABLET | Freq: Every day | ORAL | Status: DC

## 2014-04-30 NOTE — Telephone Encounter (Signed)
Sent. Start with 0.5mg , then increase to 1mg  after 1 week if needed. Thanks.

## 2014-04-30 NOTE — Telephone Encounter (Signed)
Medication phoned to pharmacy.  Patient states he would be willing to try a higher dose of Requip.  [place].

## 2014-05-05 ENCOUNTER — Ambulatory Visit
Admission: RE | Admit: 2014-05-05 | Discharge: 2014-05-05 | Disposition: A | Discharge status: Home / Self Care | Payer: Medicare Other | Source: Ambulatory Visit | Attending: Family Medicine | Admitting: Family Medicine

## 2014-05-05 DIAGNOSIS — E041 Nontoxic single thyroid nodule: Secondary | ICD-10-CM | POA: Diagnosis not present

## 2014-05-06 ENCOUNTER — Other Ambulatory Visit: Payer: Self-pay | Admitting: Family Medicine

## 2014-05-06 DIAGNOSIS — E041 Nontoxic single thyroid nodule: Secondary | ICD-10-CM

## 2014-05-13 ENCOUNTER — Ambulatory Visit (INDEPENDENT_AMBULATORY_CARE_PROVIDER_SITE_OTHER): Payer: Medicare Other | Admitting: Endocrinology

## 2014-05-13 ENCOUNTER — Encounter: Payer: Self-pay | Admitting: Endocrinology

## 2014-05-13 VITALS — BP 124/58 | HR 81 | Temp 98.7°F | Resp 16 | Wt 155.0 lb

## 2014-05-13 DIAGNOSIS — E041 Nontoxic single thyroid nodule: Secondary | ICD-10-CM

## 2014-05-13 NOTE — Progress Notes (Signed)
Reason for  Visit-  Ryan Pittman is a 73 y.o.-year-old male, referred by his PCP,  Tonia Ghent, MD,  for evaluation for Left thyroid nodule.  HPI-  The patient reports being diagnosed with thyroid nodule since Sept 2015. This was an incidental finding based on his CT chest done for evaluation of abnormal CXR.  his thyroid ultrasound was done Sept, 1st, 2015  at [facility].  It showed left sided thyroid nodule measuring 1.6 x 1.2 x 1.3 cm isoechoic, predominantly solid in left mid pole. It has a hypoechoic peripheral halo, hard to discern at places. Also mentioned is a 28mm hypoechoic nodule in left lower pole. Right lobe is without thyroid nodules. No abnormal lymph nodes.    he denies a family history of thyroid disorders in his relatives, but is not sure whether his sister has thyroid problems. The patient denies any family history of thyroid cancer or personal history of XRT to his neck area.    The patient denies prior thyroid dysfunction. I reviewed patient's thyroid tests: Lab Results  Component Value Date   TSH 1.43 11/14/2013       Patient denies feeling nodules in neck, hoarseness, dysphagia/odynophagia, SOB with lying down.  Review of systems: [ x  ] complains of    [  ] denies [  ] weight gain  [  ] constipation  [ x ] fatigue  [  ] dry skin  [  ] cold intolerance  [  ] weight loss, [  ] tremors [  ] palpitations [  ] diarrhea [  ] increased anxiety [  ] muscle weakness [  ] heat intolerance    I have reviewed the patient's past medical history, family and social history, surgical history, medications and allergies.  Independently reviewed his recent thyroid US images on PACS. Findings summarized in relevant sections of the note.  I reviewed his chart and he also has a history of PVD s/p surgery for which is on long term plavix.  Past Medical History  Diagnosis Date  . Hypertension   . Chronic kidney disease   . Hyperlipidemia   . Colon polyps   . Anemia     due to GIB,  s/p transfusion  . Hypercalcemia     h/o, resolved as of 2012, prev due to high amount of calcium intake  . PVD (peripheral vascular disease)     L leg bypass, R leg stented  . IgG gammopathy     stable as of 2012 per [facility]  . Arthritis     back pain, much worse after consecutive golf rounds   Past Surgical History  Procedure Laterality Date  . Bypass graft      L leg   History   Social History  . Marital Status: Married    Spouse Name: N/A    Number of Children: N/A  . Years of Education: N/A   Occupational History  . Not on file.   Social History Main Topics  . Smoking status: Former Smoker    Quit date: 09/04/2008  . Smokeless tobacco: Current User    Types: Chew     Comment: Occasionally chews  . Alcohol Use: Yes     Comment: Occasionally  . Drug Use: No  . Sexual Activity: Not on file   Other Topics Concern  . Not on file   Social History Narrative   Married 50+ years, 2 kids   [place] to  play golf   Current Outpatient Prescriptions on File Prior to Visit  Medication Sig Dispense Refill  . amLODipine (NORVASC) 5 MG tablet TAKE 1 TABLET EVERY DAY  90 tablet  3  . clopidogrel (PLAVIX) 75 MG tablet TAKE 1 TABLET EVERY DAY  90 tablet  1  . Coenzyme Q10 (CO Q 10) 100 MG CAPS Take 1 capsule by mouth daily.        . fenofibrate 160 MG tablet Take 1 tablet (160 mg total) by mouth daily.  90 tablet  3  . Ferrous Sulfate (IRON) 28 MG TABS Take 1 tablet by mouth daily.        [name] lisinopril (PRINIVIL,ZESTRIL) 40 MG tablet TAKE 1 TABLET EVERY DAY  90 tablet  0  . pravastatin (PRAVACHOL) 20 MG tablet TAKE 1 TABLET EVERY DAY  90 tablet  3  . rOPINIRole (REQUIP) 0.5 MG tablet Take 1-2 tablets (0.5-1 mg total) by mouth at bedtime.  60 tablet  3  . traMADol (ULTRAM) 50 MG tablet Take 1 tablet (50 mg total) by mouth 3 (three) times daily as needed (for pain).  100 tablet  1   No current facility-administered medications on file prior to visit.    Allergies  Allergen Reactions  . Penicillins Itching  . Aspirin     H/o GI bleed  . Celebrex [Celecoxib]     GI bleed  . Lipitor [Atorvastatin]     myalgias   Family History  Problem Relation Age of Onset  . Diabetes Mother   . Stroke Father      Review of Systems: [x]  complains of  [  ] denies General:   [  ] Recent weight change [  ] Fatigue  [  ] Loss of appetite Eyes: [  ]  Vision Difficulty [  ]  Eye pain ENT: [  ]  Hearing difficulty [  ]  Difficulty Swallowing CVS: [  ] Chest pain [  ]  Palpitations/Irregular Heart beat [  ]  Shortness of breath lying flat [  ] Swelling of legs Resp: [  ] Frequent Cough [  ] Shortness of Breath  [  ]  Wheezing GI: [ x ] Heartburn  [  ] Nausea or Vomiting  [  ] Diarrhea [  ] Constipation  [  ] Abdominal Pain GU: [  ]  Polyuria  [  ]  nocturia Bones/joints:  [  ]  Muscle aches  [ x ] Joint Pain  [  ] Bone pain Skin/Hair/Nails: [  ]  Rash  [  ] New stretch marks [  ]  Itching [  ] Hair loss [  ]  Excessive hair growth Reproduction: [ x ] Low sexual desire , [  ]  Women: Menstrual cycle problems [  ]  Women: Breast Discharge [  ] Men: Difficulty with erections [  ]  Men: Enlarged Breasts CNS: [  ] Frequent Headaches [  ] Blurry vision [  ] Tremors [  ] Seizures [  ] Loss of consciousness [  ] Localized weakness Endocrine: [  ]  Excess thirst [  ]  Feeling excessively hot [  ]  Feeling excessively cold Heme: [ x ]  Easy bruising [  ]  Enlarged glands or lumps in neck Allergy: [  ]  Food allergies [  ] Environmental allergies  Physical Exam- BP 124/58  Pulse 81  Temp(Src) 98.7 F (37.1 C) (Oral)  Resp 16  Wt 155 lb (70.308 kg)  SpO2 98% Wt Readings from Last 3 Encounters:  05/13/14 155 lb (70.308 kg)  04/27/14 154 lb 12 oz (70.194 kg)  11/14/13 153 lb (69.4 kg)    HEENT: Barren/AT, EOMI, no icterus, no proptosis, no chemosis, no mild lid lag, no retraction, eyes close completely Neck: thyroid gland - smooth, no palpable nodules,  non-tender, no erythema, no tracheal deviation; negative Pemberton's sign; no lympahadenopathy; no bruits Lungs: good air entry, clear bilaterally Heart: S1&S2 normal, regular rate & rhythm; no murmurs, rubs or gallops Abd: soft, NT, ND, no HSM, +BS Ext: no tremor in hands bilaterally, no edema, 2+ DP/PT pulses, good muscle mass Neuro: normal gait, 2+ reflexes bilaterally, normal 5/5 strength, no proximal myopathy  Derm: no pretibial myxoedema/skin dryness  ASSESSMENT- 1. Left thyroid nodule - thyroid U/S Sept 2015: Right thyroid lobe 3.7 x 1.7 x 2cm, without nodules Left thyroid lobe- 4.3 x 1.9 x 1.6 cm, solitary 1.6 x 1.2 x 1.3cm nodule as described above. Another 50mm nodule in left lower pole.  Isthmus 0.3 cm without nodules No Lymphadenopathy  PLAN:  Problem List Items Addressed This Visit     Endocrine   Thyroid nodule - Primary     Reviewed Thyroid US images with the patient.  We discussed that thyroid nodules are common in the population and carry a 5-15% risk of thyroid cancer. he doesn't have the risk factors for thyroid cancer and hence is at standard risk for thyroid cancer. There is no noninvasive test to differentiate between benign and malignant nodules. We discussed about thyroid nodule FNA and possible pathology reports and follow up based on these results.  I recommend FNA of the left dominant nodule given the recent thyroid Ultrasound findings. The patient is agreeable to the plan and will be set up for FNA of the left thyroid nodule. Will check with radiology/PCP regd whether he would have to hold plavix per their protocol.  TSH normal in 11/2013.  RTC 1 year. Sooner if pathology is abnormal.     Relevant Orders      US Thyroid Biopsy     Ryan Pittman [facility] 05/13/2014 12:12 PM

## 2014-05-13 NOTE — Progress Notes (Signed)
Pre visit review using our clinic review tool, if applicable. No additional management support is needed unless otherwise documented below in the visit note. 

## 2014-05-13 NOTE — Patient Instructions (Signed)
Pt to be scheduled for a FNA of his left sided nodule.  May/maynot need to hold the plavix depending on radiology instructions.  Will check with them.   Please return in 1 year.

## 2014-05-13 NOTE — Assessment & Plan Note (Signed)
Reviewed Thyroid US images with the patient.  We discussed that thyroid nodules are common in the population and carry a 5-15% risk of thyroid cancer. he doesn't have the risk factors for thyroid cancer and hence is at standard risk for thyroid cancer. There is no noninvasive test to differentiate between benign and malignant nodules. We discussed about thyroid nodule FNA and possible pathology reports and follow up based on these results.  I recommend FNA of the left dominant nodule given the recent thyroid Ultrasound findings. The patient is agreeable to the plan and will be set up for FNA of the left thyroid nodule. Will check with radiology/PCP regd whether he would have to hold plavix per their protocol.  TSH normal in 11/2013.  RTC 1 year. Sooner if pathology is abnormal.

## 2014-05-20 ENCOUNTER — Other Ambulatory Visit (HOSPITAL_COMMUNITY)
Admission: RE | Admit: 2014-05-20 | Discharge: 2014-05-20 | Disposition: A | Discharge status: Home / Self Care | Payer: Medicare Other | Source: Ambulatory Visit | Attending: Interventional Radiology | Admitting: Interventional Radiology

## 2014-05-20 ENCOUNTER — Ambulatory Visit
Admission: RE | Admit: 2014-05-20 | Discharge: 2014-05-20 | Disposition: A | Discharge status: Home / Self Care | Payer: Medicare Other | Source: Ambulatory Visit | Attending: Endocrinology | Admitting: Endocrinology

## 2014-05-20 DIAGNOSIS — E079 Disorder of thyroid, unspecified: Secondary | ICD-10-CM | POA: Insufficient documentation

## 2014-05-20 DIAGNOSIS — D449 Neoplasm of uncertain behavior of unspecified endocrine gland: Secondary | ICD-10-CM | POA: Diagnosis not present

## 2014-05-20 DIAGNOSIS — E041 Nontoxic single thyroid nodule: Secondary | ICD-10-CM | POA: Diagnosis not present

## 2014-05-25 ENCOUNTER — Telehealth: Payer: Self-pay | Admitting: Endocrinology

## 2014-05-25 DIAGNOSIS — E041 Nontoxic single thyroid nodule: Secondary | ICD-10-CM

## 2014-05-25 NOTE — Telephone Encounter (Signed)
Reviewed recent thyroid nodule FNA results from [facility], done on the left mid pole nodule-  Adequacy Reason Satisfactory For Evaluation. Diagnosis THYROID, FINE NEEDLE ASPIRATION, LMP (SPECIMEN 1 OF 1, COLLECTED ON 05/20/14): ATYPIA. (BETHESDA CLASS III). FOLLICULAR LESION OF UNDETERMINED SIGNIFICANCE. SEE COMMENT. Specimen Clinical Information Followup thyroid nodules seen on CT scan, Solitary lmp mostly solid nodule 1.6 x 1.2 x 1.3cm Source Thyroid, Fine Needle Aspiration, LMP, (Specimen 1 of 1, collected on 05/20/2014) Comment Comment: There is nuclear overlap with microfollicules and rare nuclear grooves. The differential includes a low grade neoplasm.  Discussed results with the patient and his wife over the phone today [phone]). Explained that results are atypical and would recommend repeat FNA using molecular analysis in 3 months time. Risk of malignancy upto 30%. Will ask office to schedule follow up with me after FNA to go over those results in 3 months.

## 2014-05-26 ENCOUNTER — Encounter: Payer: Self-pay | Admitting: *Deleted

## 2014-05-26 NOTE — Telephone Encounter (Signed)
Noted, app help of all involved.  

## 2014-05-27 NOTE — Telephone Encounter (Signed)
Mailed unread my chart message to patient

## 2014-05-27 NOTE — Telephone Encounter (Signed)
Mailed unread message to pt  

## 2014-06-01 ENCOUNTER — Encounter: Payer: Self-pay | Admitting: Family Medicine

## 2014-06-01 ENCOUNTER — Ambulatory Visit (INDEPENDENT_AMBULATORY_CARE_PROVIDER_SITE_OTHER): Payer: Medicare Other | Admitting: Family Medicine

## 2014-06-01 VITALS — BP 136/62 | HR 65 | Temp 98.4°F | Wt 155.5 lb

## 2014-06-01 DIAGNOSIS — E041 Nontoxic single thyroid nodule: Secondary | ICD-10-CM

## 2014-06-01 DIAGNOSIS — G4700 Insomnia, unspecified: Secondary | ICD-10-CM | POA: Diagnosis not present

## 2014-06-01 MED ORDER — TRAZODONE HCL 50 MG PO TABS: 25.0000 mg | ORAL_TABLET | Freq: Every evening | ORAL | Status: DC | PRN

## 2014-06-01 NOTE — Patient Instructions (Signed)
Try taking trazodone at night for sleep and I'll check with endocrine in the meantime about the timing of your follow up biopsy.  Take care.

## 2014-06-01 NOTE — Progress Notes (Signed)
Pre visit review using our clinic review tool, if applicable. No additional management support is needed unless otherwise documented below in the visit note.  To recap- thyroid lesion noted, inconclusive bx, endo proposed 3 month f/u bx.  Patient had questions about that.  He would prefer to get the repeat done sooner and asked what I thought.  Still w/o neck sx. No complications from the prev bx.   Insomnia and anxiety.  He's had a lot of medical testing done and that is likely affecting him. Trouble getting to sleep, asking about options.  Had tried OTC meds prev.    Meds, vitals, and allergies reviewed.   ROS: See HPI.  Otherwise, noncontributory.  nad ncat Neck supple, no LA rrr ctab Affect wnl, speech wnl

## 2014-06-02 NOTE — Assessment & Plan Note (Signed)
He'll try trazodone and notify me prn.  He agrees.

## 2014-06-02 NOTE — Assessment & Plan Note (Addendum)
I'll check with endo about the minimum time suggested between bx.  He greatly appreciated endo help, as do I, and he would like to just get his over with as soon as possible.

## 2014-06-03 ENCOUNTER — Telehealth: Payer: Self-pay | Admitting: Family Medicine

## 2014-06-03 NOTE — Telephone Encounter (Signed)
Please call pt.  I talked with endo.  Generally, the recommendation is to wait atleast 2-3 months before doing a repeat FNA. Doing it on the earlier side may still continue to give an atypical result from the healing of the first FNA. Another atypical result would lead to surgery. There is still a good chance that the repeat FNA with molecular analysis can come back normal, in which case he would just need a repeat ultrasound in 61m-1year.   I think it is reasonable to wait for the 3 months period.  I wish I had a better/immediate option, but I think it is prudent to wait on the 3 months.  I don't think that would be a case of "letting it go too long."   Thanks.

## 2014-06-03 NOTE — Telephone Encounter (Signed)
Patient notified as instructed by telephone. Patient stated that he would go along with whatever Dr. Damita Dunnings thinks.

## 2014-06-04 DIAGNOSIS — Z23 Encounter for immunization: Secondary | ICD-10-CM | POA: Diagnosis not present

## 2014-06-05 DIAGNOSIS — I70219 Atherosclerosis of native arteries of extremities with intermittent claudication, unspecified extremity: Secondary | ICD-10-CM | POA: Diagnosis not present

## 2014-06-05 DIAGNOSIS — I10 Essential (primary) hypertension: Secondary | ICD-10-CM | POA: Diagnosis not present

## 2014-06-05 DIAGNOSIS — E785 Hyperlipidemia, unspecified: Secondary | ICD-10-CM | POA: Diagnosis not present

## 2014-06-05 DIAGNOSIS — I701 Atherosclerosis of renal artery: Secondary | ICD-10-CM | POA: Diagnosis not present

## 2014-06-10 DIAGNOSIS — H2513 Age-related nuclear cataract, bilateral: Secondary | ICD-10-CM | POA: Diagnosis not present

## 2014-07-02 DIAGNOSIS — Z86010 Personal history of colonic polyps: Secondary | ICD-10-CM | POA: Diagnosis not present

## 2014-07-06 DIAGNOSIS — N401 Enlarged prostate with lower urinary tract symptoms: Secondary | ICD-10-CM | POA: Diagnosis not present

## 2014-07-11 ENCOUNTER — Other Ambulatory Visit: Payer: Self-pay | Admitting: Family Medicine

## 2014-07-16 ENCOUNTER — Telehealth: Payer: Self-pay | Admitting: Family Medicine

## 2014-07-16 NOTE — Telephone Encounter (Signed)
Ryan Pittman and [name] came in wondering if his Thyroid Biopsy can be sooner than December. He's been having headaches recently and isn't sure if his thyroid is the cause. He went to another physician regarding the ha's and they told him they can't treat him for that until his thyroid is taken care of. Please call the pt or his wife. Shayaan ph# [phone]   [name] (wife) ph# [phone] Thank you.

## 2014-07-16 NOTE — Telephone Encounter (Signed)
Spoke to wife. She mentioned that PT didn't want to go ahead with exercises for his neck till thyroid issue was resolved. They believe his HAs are coming from that.  I don't see an objection to going ahead with PT, and asked the patient to check with PCP regd this as well ( to clear any objections from his side). Waiting till mid December would be ideal. Pathologists are setting up for the ability to do molecular analysis with Veracyte at [facility] and this should be possible by nov end, as far I know. Thus dec mid would be good timing from that perspective.

## 2014-07-16 NOTE — Telephone Encounter (Signed)
lmom to call back- tried pt's number

## 2014-07-21 DIAGNOSIS — D472 Monoclonal gammopathy: Secondary | ICD-10-CM | POA: Diagnosis not present

## 2014-07-21 DIAGNOSIS — I129 Hypertensive chronic kidney disease with stage 1 through stage 4 chronic kidney disease, or unspecified chronic kidney disease: Secondary | ICD-10-CM | POA: Diagnosis not present

## 2014-07-21 DIAGNOSIS — R5383 Other fatigue: Secondary | ICD-10-CM | POA: Diagnosis not present

## 2014-07-21 DIAGNOSIS — N182 Chronic kidney disease, stage 2 (mild): Secondary | ICD-10-CM | POA: Diagnosis not present

## 2014-07-21 DIAGNOSIS — M545 Low back pain: Secondary | ICD-10-CM | POA: Diagnosis not present

## 2014-07-21 DIAGNOSIS — D649 Anemia, unspecified: Secondary | ICD-10-CM | POA: Diagnosis not present

## 2014-07-21 DIAGNOSIS — D72828 Other elevated white blood cell count: Secondary | ICD-10-CM | POA: Diagnosis not present

## 2014-07-22 DIAGNOSIS — D72829 Elevated white blood cell count, unspecified: Secondary | ICD-10-CM | POA: Insufficient documentation

## 2014-08-12 ENCOUNTER — Other Ambulatory Visit: Payer: Self-pay | Admitting: Family Medicine

## 2014-08-12 ENCOUNTER — Other Ambulatory Visit: Payer: Self-pay | Admitting: Endocrinology

## 2014-08-13 ENCOUNTER — Telehealth: Payer: Self-pay | Admitting: Emergency Medicine

## 2014-08-13 NOTE — Telephone Encounter (Signed)
lmovm for pt to make him aware to stop Plavix on 08-14-14, but to remain on his 81mg  ASA per [facility]

## 2014-08-19 ENCOUNTER — Ambulatory Visit
Admission: RE | Admit: 2014-08-19 | Discharge: 2014-08-19 | Disposition: A | Discharge status: Home / Self Care | Payer: Medicare Other | Source: Ambulatory Visit | Attending: Endocrinology | Admitting: Endocrinology

## 2014-08-19 ENCOUNTER — Other Ambulatory Visit (HOSPITAL_COMMUNITY)
Admission: RE | Admit: 2014-08-19 | Discharge: 2014-08-19 | Disposition: A | Discharge status: Home / Self Care | Payer: Medicare Other | Source: Ambulatory Visit | Attending: Interventional Radiology | Admitting: Interventional Radiology

## 2014-08-19 DIAGNOSIS — E041 Nontoxic single thyroid nodule: Secondary | ICD-10-CM

## 2014-08-25 ENCOUNTER — Telehealth: Payer: Self-pay

## 2014-08-25 NOTE — Telephone Encounter (Signed)
Patient's wife called requesting results from the FNA that was done 08/19/14. I do not see a result note regarding this and no report has been faxed to our office. I do see a report in Epic from 08/19/14. Can you please review and advise me on what to tell the patient? thanks

## 2014-08-26 ENCOUNTER — Telehealth: Payer: Self-pay | Admitting: Family Medicine

## 2014-08-26 NOTE — Telephone Encounter (Addendum)
Please call pt.  I talked to Dr. Cruzita Lederer today at ~1:30.  She stated that she had already tried to contact the patient and would call him again today to go over results and plan.

## 2014-08-26 NOTE — Telephone Encounter (Signed)
pts wife calls to ck on biopsy report; advised [name] pts wife (DPR signed to speak with pts wife) that Dr Damita Dunnings is waiting on cb from endocrine office. [name] request cb [phone] when has results.

## 2014-08-26 NOTE — Telephone Encounter (Signed)
Patient's wife called and said Dr.Phadke's office did a biopsy a week ago.  Patient's wife said they have tried to call for the results, but they're getting no response and was told that Dr. Howell Rucks will be out of the office until the end of the year.  Patient's wife said patient is very upset waiting for the results. Patient's wife is asking if you can give them the results.

## 2014-08-26 NOTE — Telephone Encounter (Signed)
Patient notified as instructed by telephone. Was advised by patient that this has already been taken care and he has the results.

## 2014-08-26 NOTE — Telephone Encounter (Signed)
Call placed to endocrine clinic.  Awaiting call back.

## 2014-08-27 NOTE — Telephone Encounter (Signed)
Dr. Cruzita Lederer spoke with patient yesterday to advise him of the results per phone note from 08/26/14 by Dr. Damita Dunnings.

## 2014-08-30 NOTE — Telephone Encounter (Signed)
Please could you call [facility] pathology and get the [facility] report on his FNA. I don't see that in his chart.  thanks

## 2014-08-31 ENCOUNTER — Telehealth: Payer: Self-pay | Admitting: Internal Medicine

## 2014-08-31 NOTE — Telephone Encounter (Signed)
Called to request the afirma report. Awaiting fax at this time.

## 2014-08-31 NOTE — Telephone Encounter (Signed)
I already talked to the pt on Thursday - we are waiting for molecular markers - I promised them I would call them by Wednesday. I keep looking to see if the markers are back.

## 2014-08-31 NOTE — Telephone Encounter (Signed)
Please read below and advise.

## 2014-08-31 NOTE — Telephone Encounter (Signed)
Patients daughter called regarding his thyroid biopsy  A letter was sent from dr. Cruzita Lederer as Dr. Howell Rucks is out of the [place]   Can you please advise the patients daughter   Call back: [phone]   Thank you

## 2014-08-31 NOTE — Telephone Encounter (Signed)
Called the pt's daughter. She wanted to be sure records are sent to [facility]. Pt to see surgeon at [facility]. Advised her that the [facility] would be sure that all records are sent to the surgeon at [facility] once the results come in. She understood.

## 2014-08-31 NOTE — Telephone Encounter (Signed)
Medical records could not find a record of the [facility] report but this is what she sent me.  Technique: Pre-procedural ultrasound scanning demonstrated unchanged appearance of dominant mixed echogenic partially cystic, predominantly solid approximately 1.6 cm nodule with the mid aspect of the left lobe of the thyroid.  The procedure was planned. The neck was prepped in the usual sterile fashion, and a sterile drape was applied covering the operative field. A timeout was performed prior to the initiation of the procedure. A local anesthesia was provided with 1% lidocaine.  Initially, under direct ultrasound guidance, 2 FNA biopsies were performed of the dominant left-sided thyroid nodule with a 27 gauge needle and placed in the Afirma solution.  Next under direct ultrasound guidance, 4 FNA biopsies were performed of the dominant left-sided thyroid nodule with a a 25 gauge needle. The samples were prepared and submitted to pathology.  Limited post procedural scanning was negative for hematoma or addition complications=. Dressings were placed The patient tolerated the above procedures well without immediate postprocedural complications.  Impression: Technically successful ultrasound guided fine needle aspiration of dominant left sided thyroid nodule with additional samples. Placed in the Afirma solution for potential additional confirmatory testing.

## 2014-09-02 DIAGNOSIS — J069 Acute upper respiratory infection, unspecified: Secondary | ICD-10-CM | POA: Diagnosis not present

## 2014-09-02 NOTE — Telephone Encounter (Signed)
Medical records will not know about this. Could you talk to the path tech/Lab manager at [facility] pathology to ask about when the result will be back. thanks

## 2014-09-03 NOTE — Telephone Encounter (Signed)
Called pt and explained results >> suspicious for PTC, no MTC. Advised to go ahead with visit with surgery. We will call daughter, Kim, to ask for fax nr to send records. 

## 2014-09-03 NOTE — Telephone Encounter (Signed)
Great! Ryan Pittman, can you please fax me the results, too? [phone]. Thank you! CG

## 2014-09-03 NOTE — Telephone Encounter (Signed)
Result Details  Nodule A : Size 1.6cm Location: Middle Left  Afirma GEC Result: Suspicious  Afirma MTC Result: Negative  Afirma MTC comments: A gene expression signature for medullary thyroid carcinoma (MTC) was not identified. A negative Afirma MTC result does not signifiently change the risk of malignancy (ROM) of the Afirma GEC suspicious result.

## 2014-09-03 NOTE — Telephone Encounter (Signed)
Fax has been sent

## 2014-09-03 NOTE — Telephone Encounter (Signed)
Spoke to [place] from [facility] pathology. She stated that the [facility] report just came in today and it usually takes about a week for the report to be scanned into the chart to view. Roselyn Reef stated that she could fax the report to me today. Awaiting fax at this time. Dr. Cruzita Lederer has been in contact with patient and his daughter this week as well.

## 2014-09-04 HISTORY — PX: THYROIDECTOMY, PARTIAL: SHX18

## 2014-09-05 NOTE — Telephone Encounter (Signed)
Thank you Ryan Pittman and Dr Cruzita Lederer for getting the results and explaining them to the patient.Thanks for setting up surgery consult. Please schedule follow up appt with me in about 2 weeks post surgery. I will talk to the patient when I get back.

## 2014-09-07 ENCOUNTER — Encounter (HOSPITAL_COMMUNITY): Payer: Self-pay

## 2014-09-07 DIAGNOSIS — E041 Nontoxic single thyroid nodule: Secondary | ICD-10-CM | POA: Diagnosis not present

## 2014-09-07 DIAGNOSIS — R05 Cough: Secondary | ICD-10-CM | POA: Diagnosis not present

## 2014-09-07 DIAGNOSIS — R011 Cardiac murmur, unspecified: Secondary | ICD-10-CM | POA: Diagnosis not present

## 2014-09-08 DIAGNOSIS — E041 Nontoxic single thyroid nodule: Secondary | ICD-10-CM | POA: Diagnosis not present

## 2014-09-09 DIAGNOSIS — Z01810 Encounter for preprocedural cardiovascular examination: Secondary | ICD-10-CM | POA: Diagnosis not present

## 2014-09-09 DIAGNOSIS — I10 Essential (primary) hypertension: Secondary | ICD-10-CM | POA: Diagnosis not present

## 2014-09-09 DIAGNOSIS — I739 Peripheral vascular disease, unspecified: Secondary | ICD-10-CM | POA: Diagnosis not present

## 2014-09-09 DIAGNOSIS — E785 Hyperlipidemia, unspecified: Secondary | ICD-10-CM | POA: Diagnosis not present

## 2014-09-09 DIAGNOSIS — N182 Chronic kidney disease, stage 2 (mild): Secondary | ICD-10-CM | POA: Diagnosis not present

## 2014-09-09 DIAGNOSIS — R011 Cardiac murmur, unspecified: Secondary | ICD-10-CM | POA: Diagnosis not present

## 2014-09-10 DIAGNOSIS — Z8639 Personal history of other endocrine, nutritional and metabolic disease: Secondary | ICD-10-CM | POA: Diagnosis not present

## 2014-09-10 DIAGNOSIS — R05 Cough: Secondary | ICD-10-CM | POA: Diagnosis not present

## 2014-09-10 DIAGNOSIS — J069 Acute upper respiratory infection, unspecified: Secondary | ICD-10-CM | POA: Diagnosis not present

## 2014-09-14 NOTE — Telephone Encounter (Signed)
Spoke to patient to inquire about his surgery date and appointment with Dr. Howell Rucks. Patient stated that he thinks his surgery date is Feb. 25, 2016. He does not want to schedule his follow-up appointment with Dr. Howell Rucks yet. Patient stated he would like to schedule his appointment with Dr. Howell Rucks closer to his surgery date.

## 2014-09-15 NOTE — Telephone Encounter (Signed)
Spoke to the patient today- he didn't have any more questions. Saw an ENT and other physicians at [place] center. Will call for their records.

## 2014-09-16 ENCOUNTER — Telehealth: Payer: Self-pay | Admitting: Family Medicine

## 2014-09-16 NOTE — Telephone Encounter (Signed)
Please confirm who the email was suppose to be from.

## 2014-09-16 NOTE — Telephone Encounter (Signed)
Pt came in today asking if dr Damita Dunnings has read an email from dr Damita Dunnings.  The email was from pt's ent specialist asking to change cholesterol medication to another type.  Also pt is wanting to know if lisinopril can cause cough and congestion, as pt has read that information online.

## 2014-09-22 NOTE — Telephone Encounter (Signed)
Called pt to find out name of ENT practice and doctor. Pt doesn't know name off hand, but will bring in the name tomorrow 09/23/14

## 2014-09-23 NOTE — Telephone Encounter (Signed)
Please see if he can bring in a copy of the email, so we are all looking at the same thing.  Thanks.

## 2014-09-23 NOTE — Telephone Encounter (Signed)
Patient said [facility] will email a copy of patient's medications today.

## 2014-09-23 NOTE — Telephone Encounter (Signed)
Spoke to patient and was advised that he is trying to get a copy of the e-mail from the ENT and will bring it to Dr. Damita Dunnings.

## 2014-09-24 NOTE — Telephone Encounter (Signed)
Thanks

## 2014-09-27 ENCOUNTER — Telehealth: Payer: Self-pay | Admitting: Family Medicine

## 2014-09-27 NOTE — Telephone Encounter (Signed)
Call pt.  Note reviewed from [facility].  They suggest more aggressive tx of his lipids.  When he is done with his appointments at [facility], we can get him in here to discuss options.  Schedule when possible, when convenient for him. Thanks.

## 2014-09-28 NOTE — Telephone Encounter (Signed)
Patient advised. Patient says the MD at [facility] didn't like the cholesterol medicine that he was on but he never tested his blood.  Patient will make appt here as soon as it is convenient.

## 2014-10-27 DIAGNOSIS — I739 Peripheral vascular disease, unspecified: Secondary | ICD-10-CM | POA: Diagnosis not present

## 2014-10-27 DIAGNOSIS — N182 Chronic kidney disease, stage 2 (mild): Secondary | ICD-10-CM | POA: Diagnosis not present

## 2014-10-27 DIAGNOSIS — I10 Essential (primary) hypertension: Secondary | ICD-10-CM | POA: Diagnosis not present

## 2014-10-27 DIAGNOSIS — D472 Monoclonal gammopathy: Secondary | ICD-10-CM | POA: Diagnosis not present

## 2014-10-27 DIAGNOSIS — E041 Nontoxic single thyroid nodule: Secondary | ICD-10-CM | POA: Diagnosis not present

## 2014-10-29 DIAGNOSIS — E782 Mixed hyperlipidemia: Secondary | ICD-10-CM | POA: Diagnosis not present

## 2014-10-29 DIAGNOSIS — C73 Malignant neoplasm of thyroid gland: Secondary | ICD-10-CM | POA: Diagnosis not present

## 2014-10-29 DIAGNOSIS — Z87891 Personal history of nicotine dependence: Secondary | ICD-10-CM | POA: Diagnosis not present

## 2014-10-29 DIAGNOSIS — I739 Peripheral vascular disease, unspecified: Secondary | ICD-10-CM | POA: Diagnosis not present

## 2014-10-29 DIAGNOSIS — M545 Low back pain: Secondary | ICD-10-CM | POA: Diagnosis not present

## 2014-10-29 DIAGNOSIS — D472 Monoclonal gammopathy: Secondary | ICD-10-CM | POA: Diagnosis not present

## 2014-10-29 DIAGNOSIS — N189 Chronic kidney disease, unspecified: Secondary | ICD-10-CM | POA: Diagnosis not present

## 2014-10-29 DIAGNOSIS — E041 Nontoxic single thyroid nodule: Secondary | ICD-10-CM | POA: Diagnosis not present

## 2014-10-29 DIAGNOSIS — I129 Hypertensive chronic kidney disease with stage 1 through stage 4 chronic kidney disease, or unspecified chronic kidney disease: Secondary | ICD-10-CM | POA: Diagnosis not present

## 2014-11-04 ENCOUNTER — Encounter: Payer: Self-pay | Admitting: Family Medicine

## 2014-11-04 ENCOUNTER — Ambulatory Visit (INDEPENDENT_AMBULATORY_CARE_PROVIDER_SITE_OTHER): Payer: Medicare Other | Admitting: Family Medicine

## 2014-11-04 VITALS — BP 128/60 | HR 86 | Temp 98.8°F | Wt 156.5 lb

## 2014-11-04 DIAGNOSIS — E041 Nontoxic single thyroid nodule: Secondary | ICD-10-CM

## 2014-11-04 DIAGNOSIS — I10 Essential (primary) hypertension: Secondary | ICD-10-CM

## 2014-11-04 DIAGNOSIS — E7800 Pure hypercholesterolemia, unspecified: Secondary | ICD-10-CM

## 2014-11-04 MED ORDER — FENOFIBRATE 160 MG PO TABS: 160.0000 mg | ORAL_TABLET | Freq: Every day | ORAL | Status: DC

## 2014-11-04 MED ORDER — LOSARTAN POTASSIUM 100 MG PO TABS: 100.0000 mg | ORAL_TABLET | Freq: Every day | ORAL | Status: DC

## 2014-11-04 MED ORDER — AMLODIPINE BESYLATE 5 MG PO TABS: 5.0000 mg | ORAL_TABLET | Freq: Every day | ORAL | Status: DC

## 2014-11-04 NOTE — Progress Notes (Signed)
Pre visit review using our clinic review tool, if applicable. No additional management support is needed unless otherwise documented below in the visit note.  Partial thyroidectomy done at [facility] about 2 weeks ago.  Has f/u re: path and post op pending.  BP was elevated at [facility], improved now. Elevation was likely anxiety related, given the surgery.  Has a dry cough, likely ACE cough since he feels well o/w and wanted to talk about that today. No CP, SOB, BLE edema.    HLD.  Had seen cards at [facility] and rec for more intensive statin treatment.  Agreed to recheck lipids and then consider inc in pravastatin.    PMH and SH reviewed  ROS: See HPI, otherwise noncontributory.  Meds, vitals, and allergies reviewed.   GEN: nad, alert and oriented HEENT: mucous membranes moist NECK: supple w/o LA, thyroid surgery incision dressed.   CV: rrr. PULM: ctab, no inc wob ABD: soft, +bs EXT: no edema SKIN: no acute rash

## 2014-11-04 NOTE — Patient Instructions (Signed)
Go to the lab on the way out.  We'll contact you with your lab report. Restart fenofibrate for your triglycerides.   We'll make a decision about your pravastatin dose when I see you labs.  Stop lisinopril for now and change to losartan.  That should get rid of the cough.  If not better, then let me know.  If your BP stays up after the change, then let me know about that too.  Take care. Glad to see you.

## 2014-11-05 LAB — LDL CHOLESTEROL, DIRECT: LDL DIRECT: 135 mg/dL

## 2014-11-06 ENCOUNTER — Telehealth: Payer: Self-pay | Admitting: Family Medicine

## 2014-11-06 MED ORDER — PRAVASTATIN SODIUM 40 MG PO TABS: 40.0000 mg | ORAL_TABLET | Freq: Every day | ORAL | Status: DC

## 2014-11-06 NOTE — Telephone Encounter (Signed)
emmi emailed °

## 2014-11-06 NOTE — Assessment & Plan Note (Signed)
Per [facility].  Await path report.

## 2014-11-06 NOTE — Assessment & Plan Note (Signed)
See labs, reasonable to inc statin and recheck in about 2 months.  rx sent.

## 2014-11-06 NOTE — Assessment & Plan Note (Addendum)
Has a dry cough, likely ACE cough since he feels well o/w.  Change to losartan.  BP at goal today.  He'll notify me if BP not controlled on new med or if cough continues.  Prev [facility] notes from Dr. Edwin Dada reviewed re: BP and lipids. >25 minutes spent in face to face time with patient, >50% spent in counselling or coordination of care.

## 2014-11-09 DIAGNOSIS — Z09 Encounter for follow-up examination after completed treatment for conditions other than malignant neoplasm: Secondary | ICD-10-CM | POA: Diagnosis not present

## 2014-11-09 DIAGNOSIS — E041 Nontoxic single thyroid nodule: Secondary | ICD-10-CM | POA: Diagnosis not present

## 2014-11-09 DIAGNOSIS — C73 Malignant neoplasm of thyroid gland: Secondary | ICD-10-CM | POA: Diagnosis not present

## 2014-11-12 DIAGNOSIS — C73 Malignant neoplasm of thyroid gland: Secondary | ICD-10-CM | POA: Insufficient documentation

## 2014-12-03 DIAGNOSIS — C73 Malignant neoplasm of thyroid gland: Secondary | ICD-10-CM | POA: Diagnosis not present

## 2014-12-11 DIAGNOSIS — I10 Essential (primary) hypertension: Secondary | ICD-10-CM | POA: Diagnosis not present

## 2014-12-11 DIAGNOSIS — E785 Hyperlipidemia, unspecified: Secondary | ICD-10-CM | POA: Diagnosis not present

## 2014-12-11 DIAGNOSIS — I701 Atherosclerosis of renal artery: Secondary | ICD-10-CM | POA: Diagnosis not present

## 2014-12-11 DIAGNOSIS — I739 Peripheral vascular disease, unspecified: Secondary | ICD-10-CM | POA: Diagnosis not present

## 2014-12-11 DIAGNOSIS — I70213 Atherosclerosis of native arteries of extremities with intermittent claudication, bilateral legs: Secondary | ICD-10-CM | POA: Diagnosis not present

## 2014-12-18 ENCOUNTER — Other Ambulatory Visit (INDEPENDENT_AMBULATORY_CARE_PROVIDER_SITE_OTHER): Payer: Medicare Other

## 2014-12-18 DIAGNOSIS — E7800 Pure hypercholesterolemia, unspecified: Secondary | ICD-10-CM

## 2014-12-18 LAB — LIPID PANEL
CHOLESTEROL: 180 mg/dL (ref 0–200)
HDL: 49 mg/dL (ref >39.00)
LDL Cholesterol: 97 mg/dL (ref 0–99)
NonHDL: 131
TRIGLYCERIDES: 172 mg/dL — AB (ref 0.0–149.0)
Total CHOL/HDL Ratio: 4
VLDL: 34.4 mg/dL (ref 0.0–40.0)

## 2014-12-21 ENCOUNTER — Encounter: Payer: Self-pay | Admitting: *Deleted

## 2014-12-28 DIAGNOSIS — E890 Postprocedural hypothyroidism: Secondary | ICD-10-CM | POA: Diagnosis not present

## 2014-12-28 DIAGNOSIS — C73 Malignant neoplasm of thyroid gland: Secondary | ICD-10-CM | POA: Diagnosis not present

## 2014-12-28 DIAGNOSIS — Z79899 Other long term (current) drug therapy: Secondary | ICD-10-CM | POA: Diagnosis not present

## 2015-01-07 DIAGNOSIS — C73 Malignant neoplasm of thyroid gland: Secondary | ICD-10-CM | POA: Diagnosis not present

## 2015-01-07 DIAGNOSIS — E890 Postprocedural hypothyroidism: Secondary | ICD-10-CM | POA: Insufficient documentation

## 2015-01-14 DIAGNOSIS — C73 Malignant neoplasm of thyroid gland: Secondary | ICD-10-CM | POA: Diagnosis not present

## 2015-01-14 DIAGNOSIS — E890 Postprocedural hypothyroidism: Secondary | ICD-10-CM | POA: Diagnosis not present

## 2015-02-02 ENCOUNTER — Emergency Department
Admission: EM | Admit: 2015-02-02 | Discharge: 2015-02-02 | Discharge status: Against Medical Advice | Payer: Medicare Other | Attending: Emergency Medicine | Admitting: Emergency Medicine

## 2015-02-02 DIAGNOSIS — K088 Other specified disorders of teeth and supporting structures: Secondary | ICD-10-CM | POA: Diagnosis not present

## 2015-02-02 DIAGNOSIS — Z87891 Personal history of nicotine dependence: Secondary | ICD-10-CM | POA: Diagnosis not present

## 2015-02-02 DIAGNOSIS — N189 Chronic kidney disease, unspecified: Secondary | ICD-10-CM | POA: Insufficient documentation

## 2015-02-02 DIAGNOSIS — I129 Hypertensive chronic kidney disease with stage 1 through stage 4 chronic kidney disease, or unspecified chronic kidney disease: Secondary | ICD-10-CM | POA: Insufficient documentation

## 2015-02-02 NOTE — ED Notes (Signed)
Pt here with bleeding from his mouth. Pt states that he woke up tonight with excessive bleeding, pt states that this has happened before.  Pt is on blood thinner, Bleeding has stopped at this time

## 2015-02-10 ENCOUNTER — Other Ambulatory Visit: Payer: Self-pay | Admitting: Family Medicine

## 2015-02-18 ENCOUNTER — Other Ambulatory Visit: Payer: Self-pay | Admitting: Family Medicine

## 2015-02-18 NOTE — Telephone Encounter (Signed)
Electronic refill request. Last Filled:    60 tablet 3 RF on 04/30/2014  Please advise.

## 2015-02-19 NOTE — Telephone Encounter (Signed)
Sent. Thanks.   

## 2015-03-02 DIAGNOSIS — I10 Essential (primary) hypertension: Secondary | ICD-10-CM | POA: Diagnosis not present

## 2015-03-02 DIAGNOSIS — I739 Peripheral vascular disease, unspecified: Secondary | ICD-10-CM | POA: Diagnosis not present

## 2015-03-02 DIAGNOSIS — I701 Atherosclerosis of renal artery: Secondary | ICD-10-CM | POA: Diagnosis not present

## 2015-03-02 DIAGNOSIS — E785 Hyperlipidemia, unspecified: Secondary | ICD-10-CM | POA: Diagnosis not present

## 2015-03-02 DIAGNOSIS — I70213 Atherosclerosis of native arteries of extremities with intermittent claudication, bilateral legs: Secondary | ICD-10-CM | POA: Diagnosis not present

## 2015-03-02 DIAGNOSIS — I6529 Occlusion and stenosis of unspecified carotid artery: Secondary | ICD-10-CM | POA: Diagnosis not present

## 2015-03-10 DIAGNOSIS — D2239 Melanocytic nevi of other parts of face: Secondary | ICD-10-CM | POA: Diagnosis not present

## 2015-03-10 DIAGNOSIS — L570 Actinic keratosis: Secondary | ICD-10-CM | POA: Diagnosis not present

## 2015-03-10 DIAGNOSIS — D2262 Melanocytic nevi of left upper limb, including shoulder: Secondary | ICD-10-CM | POA: Diagnosis not present

## 2015-03-10 DIAGNOSIS — Z85828 Personal history of other malignant neoplasm of skin: Secondary | ICD-10-CM | POA: Diagnosis not present

## 2015-03-10 DIAGNOSIS — X32XXXA Exposure to sunlight, initial encounter: Secondary | ICD-10-CM | POA: Diagnosis not present

## 2015-03-10 DIAGNOSIS — D2271 Melanocytic nevi of right lower limb, including hip: Secondary | ICD-10-CM | POA: Diagnosis not present

## 2015-03-15 DIAGNOSIS — E890 Postprocedural hypothyroidism: Secondary | ICD-10-CM | POA: Diagnosis not present

## 2015-04-01 DIAGNOSIS — N182 Chronic kidney disease, stage 2 (mild): Secondary | ICD-10-CM | POA: Diagnosis not present

## 2015-04-01 DIAGNOSIS — I10 Essential (primary) hypertension: Secondary | ICD-10-CM | POA: Diagnosis not present

## 2015-04-02 DIAGNOSIS — Z86010 Personal history of colonic polyps: Secondary | ICD-10-CM | POA: Diagnosis not present

## 2015-05-04 ENCOUNTER — Encounter: Payer: Self-pay | Admitting: *Deleted

## 2015-05-04 ENCOUNTER — Ambulatory Visit: Payer: Medicare Other | Admitting: Anesthesiology

## 2015-05-04 ENCOUNTER — Ambulatory Visit
Admission: RE | Admit: 2015-05-04 | Discharge: 2015-05-04 | Disposition: A | Discharge status: Home / Self Care | Payer: Medicare Other | Source: Ambulatory Visit | Attending: Gastroenterology | Admitting: Gastroenterology

## 2015-05-04 ENCOUNTER — Encounter
Admission: RE | Disposition: A | Discharge status: Home / Self Care | Payer: Self-pay | Source: Ambulatory Visit | Attending: Gastroenterology

## 2015-05-04 DIAGNOSIS — E785 Hyperlipidemia, unspecified: Secondary | ICD-10-CM | POA: Diagnosis not present

## 2015-05-04 DIAGNOSIS — K5790 Diverticulosis of intestine, part unspecified, without perforation or abscess without bleeding: Secondary | ICD-10-CM | POA: Diagnosis not present

## 2015-05-04 DIAGNOSIS — N189 Chronic kidney disease, unspecified: Secondary | ICD-10-CM | POA: Insufficient documentation

## 2015-05-04 DIAGNOSIS — Z886 Allergy status to analgesic agent status: Secondary | ICD-10-CM | POA: Insufficient documentation

## 2015-05-04 DIAGNOSIS — Z79899 Other long term (current) drug therapy: Secondary | ICD-10-CM | POA: Insufficient documentation

## 2015-05-04 DIAGNOSIS — K5730 Diverticulosis of large intestine without perforation or abscess without bleeding: Secondary | ICD-10-CM | POA: Insufficient documentation

## 2015-05-04 DIAGNOSIS — D123 Benign neoplasm of transverse colon: Secondary | ICD-10-CM | POA: Insufficient documentation

## 2015-05-04 DIAGNOSIS — I129 Hypertensive chronic kidney disease with stage 1 through stage 4 chronic kidney disease, or unspecified chronic kidney disease: Secondary | ICD-10-CM | POA: Diagnosis not present

## 2015-05-04 DIAGNOSIS — D125 Benign neoplasm of sigmoid colon: Secondary | ICD-10-CM | POA: Diagnosis not present

## 2015-05-04 DIAGNOSIS — I739 Peripheral vascular disease, unspecified: Secondary | ICD-10-CM | POA: Diagnosis not present

## 2015-05-04 DIAGNOSIS — D122 Benign neoplasm of ascending colon: Secondary | ICD-10-CM | POA: Insufficient documentation

## 2015-05-04 DIAGNOSIS — K635 Polyp of colon: Secondary | ICD-10-CM | POA: Diagnosis not present

## 2015-05-04 DIAGNOSIS — Z880 Allergy status to penicillin: Secondary | ICD-10-CM | POA: Insufficient documentation

## 2015-05-04 DIAGNOSIS — D120 Benign neoplasm of cecum: Secondary | ICD-10-CM | POA: Insufficient documentation

## 2015-05-04 DIAGNOSIS — Z86010 Personal history of colonic polyps: Secondary | ICD-10-CM | POA: Diagnosis not present

## 2015-05-04 HISTORY — PX: COLONOSCOPY WITH PROPOFOL: SHX5780

## 2015-05-04 LAB — HM COLONOSCOPY

## 2015-05-04 SURGERY — COLONOSCOPY WITH PROPOFOL
Anesthesia: General

## 2015-05-04 MED ORDER — LIDOCAINE HCL (CARDIAC) 20 MG/ML IV SOLN
INTRAVENOUS | Status: DC | PRN
  Administered 2015-05-04: 20 mg via INTRAVENOUS

## 2015-05-04 MED ORDER — GLYCOPYRROLATE 0.2 MG/ML IJ SOLN
INTRAMUSCULAR | Status: DC | PRN
  Administered 2015-05-04: 0.1 mg via INTRAVENOUS

## 2015-05-04 MED ORDER — SODIUM CHLORIDE 0.9 % IV SOLN
INTRAVENOUS | Status: DC
  Administered 2015-05-04: 15:00:00 via INTRAVENOUS

## 2015-05-04 MED ORDER — SODIUM CHLORIDE 0.9 % IV SOLN
INTRAVENOUS | Status: DC
  Administered 2015-05-04: 1000 mL via INTRAVENOUS

## 2015-05-04 MED ORDER — PROPOFOL INFUSION 10 MG/ML OPTIME
INTRAVENOUS | Status: DC | PRN
  Administered 2015-05-04: 150 ug/kg/min via INTRAVENOUS

## 2015-05-04 NOTE — Op Note (Signed)
[facility] Gastroenterology Patient Name: Ryan Pittman Procedure Date: 05/04/2015 2:47 PM MRN: [id number] Account #: [id number] Date of Birth: 27-Sep-1940 Admit Type: Outpatient Age: 74 Room: [facility] ENDO ROOM 3 Gender: Male Note Status: Finalized Procedure:         Colonoscopy Indications:       Personal history of colonic polyps Providers:         Lollie Sails, MD Referring MD:      Elveria Rising. Damita Dunnings (Referring MD) Medicines:         Monitored Anesthesia Care Complications:     No immediate complications. Procedure:         Pre-Anesthesia Assessment:                    - ASA Grade Assessment: III - A patient with severe                     systemic disease.                    After obtaining informed consent, the colonoscope was                     passed under direct vision. Throughout the procedure, the                     patient's blood pressure, pulse, and oxygen saturations                     were monitored continuously. The Colonoscope was                     introduced through the anus and advanced to the the cecum,                     identified by appendiceal orifice and ileocecal valve. The                     colonoscopy was performed without difficulty. The patient                     tolerated the procedure well. The quality of the bowel                     preparation was fair. Findings:      Two sessile polyps were found in the sigmoid colon. The polyps were 1 to       2 mm in size. These polyps were removed with a cold biopsy forceps.       Resection and retrieval were complete.      A 3 mm polyp was found in the proximal transverse colon. The polyp was       flat. The polyp was removed with a cold biopsy forceps. Resection and       retrieval were complete.      Three sessile polyps were found in the cecum. The polyps were 2 to 4 mm       in size. These polyps were removed with a cold biopsy forceps. Resection       and retrieval were  complete.      A 3 mm polyp was found at the ileocecal valve. The polyp was sessile.       The polyp was removed with a cold biopsy forceps. Resection and       retrieval were  complete.      A 3 mm polyp was found in the proximal ascending colon. The polyp was       flat. The polyp was removed with a cold biopsy forceps. Resection and       retrieval were complete.      Multiple small to mediuim-mouthed diverticula were found in the sigmoid       colon, in the descending colon, in the transverse colon, in the       ascending colon and in the cecum. Impression:        - Two 1 to 2 mm polyps in the sigmoid colon. Resected and                     retrieved.                    - One 3 mm polyp in the proximal transverse colon.                     Resected and retrieved.                    - Three 2 to 4 mm polyps in the cecum. Resected and                     retrieved.                    - One 3 mm polyp at the ileocecal valve. Resected and                     retrieved.                    - One 3 mm polyp in the proximal ascending colon. Resected                     and retrieved.                    - Diverticulosis in the sigmoid colon, in the descending                     colon, in the transverse colon, in the ascending colon and                     in the cecum. Recommendation:    - Discharge patient to home.                    - Await pathology results.                    - Telephone GI clinic for pathology results in 1 week. Procedure Code(s): --- Professional ---                    [phone], Colonoscopy, flexible; with biopsy, single or                     multiple Diagnosis Code(s): --- Professional ---                    211.3, Benign neoplasm of colon                    V12.72, Personal history of colonic polyps  562.10, Diverticulosis of colon (without mention of                     hemorrhage) CPT copyright 2014 American Medical Association. All rights  reserved. The codes documented in this report are preliminary and upon coder review may  be revised to meet current compliance requirements. Lollie Sails, MD 05/04/2015 3:35:44 PM This report has been signed electronically. Number of Addenda: 0 Note Initiated On: 05/04/2015 2:47 PM Scope Withdrawal Time: 0 hours 17 minutes 55 seconds  Total Procedure Duration: 0 hours 31 minutes 43 seconds       [facility]

## 2015-05-04 NOTE — H&P (Signed)
Outpatient short stay form Pre-procedure 05/04/2015 2:48 PM Ryan Sails MD  Primary Physician: Dr. Elsie Stain  Reason for visit:  Colonoscopy  History of present illness:  Patient is a 74 year old male with a personal history of colon polyps. His last colonoscopy was in 2011. Tolerated his prep well. Does not take any aspirin products. He does take Plavix but stopped that 5-6 days ago.    Current facility-administered medications:  .  0.9 %  sodium chloride infusion, , Intravenous, Continuous, Ryan Sails, MD .  0.9 %  sodium chloride infusion, , Intravenous, Continuous, Ryan Sails, MD, Last Rate: 20 mL/hr at 05/04/15 1356, 1,000 mL at 05/04/15 1356  Prescriptions prior to admission  Medication Sig Dispense Refill Last Dose  . amLODipine (NORVASC) 5 MG tablet Take 1 tablet (5 mg total) by mouth daily. 90 tablet 3 05/03/2015 at Unknown time  . clopidogrel (PLAVIX) 75 MG tablet TAKE 1 TABLET EVERY DAY 90 tablet 1 Past Week at Unknown time  . Coenzyme Q10 (CO Q 10) 100 MG CAPS Take 1 capsule by mouth daily.     05/03/2015 at Unknown time  . losartan (COZAAR) 100 MG tablet Take 1 tablet (100 mg total) by mouth daily. 90 tablet 3 05/03/2015 at Unknown time  . pravastatin (PRAVACHOL) 40 MG tablet Take 1 tablet (40 mg total) by mouth daily. 90 tablet 3 05/03/2015 at Unknown time  . fenofibrate 160 MG tablet Take 1 tablet (160 mg total) by mouth daily. (Patient not taking: Reported on 05/04/2015)   Not Taking at Unknown time  . Ferrous Sulfate (IRON) 28 MG TABS Take 1 tablet by mouth daily.     Taking  . rOPINIRole (REQUIP) 0.25 MG tablet TAKE ONE OR TWO TABLETS AT BEDTIME (Patient not taking: Reported on 05/04/2015) 30 tablet 5 Not Taking at Unknown time  . rOPINIRole (REQUIP) 0.5 MG tablet Take 1-2 tablets (0.5-1 mg total) by mouth at bedtime. (Patient not taking: Reported on 05/04/2015) 60 tablet 3 Not Taking at Unknown time  . traZODone (DESYREL) 50 MG tablet Take 0.5-1 tablets  (25-50 mg total) by mouth at bedtime as needed for sleep. (Patient not taking: Reported on 05/04/2015) 30 tablet 3 Not Taking at Unknown time     Allergies  Allergen Reactions  . Penicillins Itching  . Ace Inhibitors Other (See Comments)    cough  . Aspirin     H/o GI bleed  . Celebrex [Celecoxib]     GI bleed  . Lipitor [Atorvastatin]     myalgias     Past Medical History  Diagnosis Date  . Hypertension   . Chronic kidney disease   . Hyperlipidemia   . Colon polyps   . Anemia     due to GIB, s/p transfusion  . Hypercalcemia     h/o, resolved as of 2012, prev due to high amount of calcium intake  . PVD (peripheral vascular disease)     L leg bypass, R leg stented  . IgG gammopathy     stable as of 2012 per [facility]  . Arthritis     back pain, much worse after consecutive golf rounds    Review of systems:      Physical Exam    Heart and lungs: Regular rate and rhythm without rub or gallop, lungs are bilaterally clear    HEENT: Normocephalic atraumatic eyes are anicteric    Other:     Pertinant exam for procedure: Soft nontender nondistended bowel sounds positive  normoactive    Planned proceedures: Colonoscopy and indicated procedures. I have discussed the risks benefits and complications of procedures to include not limited to bleeding, infection, perforation and the risk of sedation and the patient wishes to proceed.    Ryan Sails, MD Gastroenterology 05/04/2015  2:48 PM

## 2015-05-04 NOTE — Transfer of Care (Signed)
Immediate Anesthesia Transfer of Care Note  Patient: Ryan Pittman  Procedure(s) Performed: Procedure(s): COLONOSCOPY WITH PROPOFOL (N/A)  Patient Location: PACU  Anesthesia Type:General  Level of Consciousness: sedated  Airway & Oxygen Therapy: Patient Spontanous Breathing and Patient connected to face mask oxygen  Post-op Assessment: Report given to RN and Post -op Vital signs reviewed and stable  Post vital signs: Reviewed and stable  Last Vitals:  Filed Vitals:   05/04/15 1337  BP: 144/60  Pulse: 74  Temp: 36.4 C  Resp: 18    Complications: No apparent anesthesia complications

## 2015-05-04 NOTE — Anesthesia Preprocedure Evaluation (Addendum)
Anesthesia Evaluation  Patient identified by MRN, date of birth, ID band Patient awake    Reviewed: Allergy & Precautions, NPO status   Airway Mallampati: II       Dental no notable dental hx.    Pulmonary former smoker,    + decreased breath sounds      Cardiovascular hypertension, Pt. on medications + Peripheral Vascular Disease Normal cardiovascular exam    Neuro/Psych    GI/Hepatic Neg liver ROS, PUD,   Endo/Other  negative endocrine ROS  Renal/GU CRFRenal disease     Musculoskeletal  (+) Arthritis -,   Abdominal Normal abdominal exam  (+)   Peds negative pediatric ROS (+)  Hematology   Anesthesia Other Findings   Reproductive/Obstetrics                            Anesthesia Physical Anesthesia Plan  ASA: II  Anesthesia Plan: General   Post-op Pain Management:    Induction: Intravenous  Airway Management Planned: Nasal Cannula  Additional Equipment:   Intra-op Plan:   Post-operative Plan:   Informed Consent: I have reviewed the patients History and Physical, chart, labs and discussed the procedure including the risks, benefits and alternatives for the proposed anesthesia with the patient or authorized representative who has indicated his/her understanding and acceptance.     Plan Discussed with: CRNA  Anesthesia Plan Comments:         Anesthesia Quick Evaluation

## 2015-05-05 ENCOUNTER — Encounter: Payer: Self-pay | Admitting: Gastroenterology

## 2015-05-05 NOTE — Anesthesia Postprocedure Evaluation (Signed)
  Anesthesia Post-op Note  Patient: Ryan Pittman  Procedure(s) Performed: Procedure(s): COLONOSCOPY WITH PROPOFOL (N/A)  Anesthesia type:General  Patient location: PACU  Post pain: Pain level controlled  Post assessment: Post-op Vital signs reviewed, Patient's Cardiovascular Status Stable, Respiratory Function Stable, Patent Airway and No signs of Nausea or vomiting  Post vital signs: Reviewed and stable  Last Vitals:  Filed Vitals:   05/04/15 1610  BP: 145/55  Pulse: 54  Temp:   Resp: 23    Level of consciousness: awake, alert  and patient cooperative  Complications: No apparent anesthesia complications

## 2015-05-07 LAB — SURGICAL PATHOLOGY

## 2015-05-13 ENCOUNTER — Encounter: Payer: Self-pay | Admitting: Family Medicine

## 2015-05-24 DIAGNOSIS — E890 Postprocedural hypothyroidism: Secondary | ICD-10-CM | POA: Diagnosis not present

## 2015-05-24 DIAGNOSIS — Z79899 Other long term (current) drug therapy: Secondary | ICD-10-CM | POA: Diagnosis not present

## 2015-05-24 DIAGNOSIS — C73 Malignant neoplasm of thyroid gland: Secondary | ICD-10-CM | POA: Diagnosis not present

## 2015-06-01 ENCOUNTER — Telehealth: Payer: Self-pay | Admitting: *Deleted

## 2015-06-01 NOTE — Telephone Encounter (Signed)
Okay to wait until tomorrow if not having uncontrolled bleeding.  Can use a wet cotton ball tucked along the gumline if needed, but don't sleep with it in.  Don't floss in the meantime.  Thanks.

## 2015-06-01 NOTE — Telephone Encounter (Signed)
Patient's wife, [name], called with concerns of bleeding from his gums.  It seems to only happen at night and occasionally during meals.  Patient is on Plavix.  He has been to the ED in the past for this when during one episode he was not able to control bleeding.  He is not actively bleeding at this time.  Patient does not wish to go to urgent care or ED.  Are you okay with waiting until tomorrow for ov here?  Please advise.

## 2015-06-01 NOTE — Telephone Encounter (Signed)
I left message on both patient's home and wife's cell number notifying patient of Dr. Josefine Class comments. Thanks!

## 2015-06-02 ENCOUNTER — Encounter: Payer: Self-pay | Admitting: Family Medicine

## 2015-06-02 ENCOUNTER — Ambulatory Visit (INDEPENDENT_AMBULATORY_CARE_PROVIDER_SITE_OTHER): Payer: Medicare Other | Admitting: Family Medicine

## 2015-06-02 ENCOUNTER — Telehealth: Payer: Self-pay | Admitting: Family Medicine

## 2015-06-02 VITALS — BP 114/50 | HR 75 | Temp 97.8°F | Ht 65.0 in | Wt 156.5 lb

## 2015-06-02 DIAGNOSIS — K055 Other periodontal diseases: Secondary | ICD-10-CM

## 2015-06-02 DIAGNOSIS — R5383 Other fatigue: Secondary | ICD-10-CM

## 2015-06-02 DIAGNOSIS — Z7901 Long term (current) use of anticoagulants: Secondary | ICD-10-CM

## 2015-06-02 DIAGNOSIS — K068 Other specified disorders of gingiva and edentulous alveolar ridge: Secondary | ICD-10-CM

## 2015-06-02 DIAGNOSIS — I10 Essential (primary) hypertension: Secondary | ICD-10-CM | POA: Diagnosis not present

## 2015-06-02 LAB — CBC WITH DIFFERENTIAL/PLATELET
BASOS ABS: 0.1 10*3/uL (ref 0.0–0.1)
Basophils Relative: 0.6 % (ref 0.0–3.0)
Eosinophils Absolute: 0 10*3/uL (ref 0.0–0.7)
Eosinophils Relative: 0 % (ref 0.0–5.0)
HCT: 37.3 % — ABNORMAL LOW (ref 39.0–52.0)
Hemoglobin: 12.6 g/dL — ABNORMAL LOW (ref 13.0–17.0)
LYMPHS ABS: 2.2 10*3/uL (ref 0.7–4.0)
Lymphocytes Relative: 17.7 % (ref 12.0–46.0)
MCHC: 33.6 g/dL (ref 30.0–36.0)
MCV: 92.8 fl (ref 78.0–100.0)
MONO ABS: 1.1 10*3/uL — AB (ref 0.1–1.0)
Monocytes Relative: 8.5 % (ref 3.0–12.0)
NEUTROS PCT: 73.2 % (ref 43.0–77.0)
Neutro Abs: 9.2 10*3/uL — ABNORMAL HIGH (ref 1.4–7.7)
PLATELETS: 258 10*3/uL (ref 150.0–400.0)
RBC: 4.02 Mil/uL — AB (ref 4.22–5.81)
RDW: 14.8 % (ref 11.5–15.5)
WBC: 12.6 10*3/uL — ABNORMAL HIGH (ref 4.0–10.5)

## 2015-06-02 MED ORDER — TRAZODONE HCL 100 MG PO TABS: 100.0000 mg | ORAL_TABLET | Freq: Every evening | ORAL | Status: DC | PRN

## 2015-06-02 NOTE — Telephone Encounter (Signed)
Thanks

## 2015-06-02 NOTE — Telephone Encounter (Signed)
Patient Name: RODRICKUS MIN  DOB: 1941/06/23    Initial Comment Caller states husband is having bleeding from the gums, wants to see if he can get blood work checked and possibly move his appt up   Nurse Assessment  Nurse: Facilities manager, RN, Water quality scientist Date/Time (Eastern Time): 06/02/2015 9:21:17 AM  Confirm and document reason for call. If symptomatic, describe symptoms. ---Caller states husband is having bleeding from the gums off and on for the last 3-4 months., wants to see if he can get blood work checked and possibly move his appt up. He is on plavix. He is feeling weak and tired. He has been to the dentist multiple times, but the were not able to find anything.  Has the patient traveled out of the country within the last 30 days? ---Not Applicable  Does the patient require triage? ---Yes  Related visit to physician within the last 2 weeks? ---No  Does the PT have any chronic conditions? (i.e. diabetes, asthma, etc.) ---Yes  List chronic conditions. ---HTN, kidney disease, anemia     Guidelines    Guideline Title Affirmed Question Affirmed Notes  Mouth Symptoms Bleeding from mouth is a chronic symptom (recurrent or ongoing AND present > 4 weeks)    Final Disposition User   See PCP When Office is Open (within 3 days) [place], RN, Water quality scientist    Comments  Appt made for 10:15 am this morning with Dr. Edilia Bo.   Disagree/Comply: Comply

## 2015-06-02 NOTE — Telephone Encounter (Signed)
Patient seen today by Dr. Lorelei Pont.

## 2015-06-02 NOTE — Progress Notes (Signed)
Dr. Frederico Hamman T. Copland, MD, [facility] [place], [address] Phone: [phone] Fax: [phone]  06/02/2015  Patient: Ryan Pittman, MRN: [id number], DOB: 08-29-1941, 74 y.o.  Primary Physician:  Elsie Stain, MD  Chief Complaint: Gums Bleeding  Subjective:   Ryan Pittman is a 74 y.o. very pleasant male patient who presents with the following:  Still working Seen dentist and Dr. Lucky Cowboy He is had some intermittent gum bleeding, and he is seen his dentist a few times.  There appears to be another on his teeth.  On Plavix for PVD   Has an endocrinologist - on 75 micrograms   Past Medical History, Surgical History, Social History, Family History, Problem List, Medications, and Allergies have been reviewed and updated if relevant.  Patient Active Problem List   Diagnosis Date Noted  . Thyroid nodule 04/29/2014  . Abnormal CXR 04/27/2014  . RLS (restless legs syndrome) 11/16/2013  . Insomnia 06/18/2013  . Medicare annual wellness visit, subsequent 03/12/2012  . HTN (hypertension) 08/14/2011  . BPH (benign prostatic hyperplasia) 04/28/2011  . CKD (chronic kidney disease) stage 3, GFR 30-59 ml/min 02/13/2011  . OA (osteoarthritis) 02/13/2011  . Carotid stenosis 02/13/2011  . ED (erectile dysfunction) 02/13/2011  . Duodenal ulcer 02/13/2011  . Diverticulosis 02/13/2011  . PVD (peripheral vascular disease) 01/26/2011  . S/P bypass graft of extremity 01/26/2011  . Pure hypercholesterolemia 01/26/2011  . Back pain 01/26/2011  . 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    Past Medical History  Diagnosis Date  . Hypertension   . Chronic kidney disease   . Hyperlipidemia   . Colon polyps   . Anemia     due to GIB, s/p transfusion  . Hypercalcemia     h/o, resolved as of 2012, prev due to high amount of calcium intake  . PVD (peripheral vascular disease)     L leg bypass, R leg stented  . IgG gammopathy     stable as  of 2012 per [facility]  . Arthritis     back pain, much worse after consecutive golf rounds    Past Surgical History  Procedure Laterality Date  . Bypass graft      L leg  . Thyroidectomy, partial  2016  . Colonoscopy with propofol N/A 05/04/2015    Procedure: COLONOSCOPY WITH PROPOFOL;  Surgeon: Lollie Sails, MD;  Location: [facility] ENDOSCOPY;  Service: Endoscopy;  Laterality: N/A;    Social History   Social History  . Marital Status: Married    Spouse Name: N/A  . Number of Children: N/A  . Years of Education: N/A   Occupational History  . Not on file.   Social History Main Topics  . Smoking status: Former Smoker    Quit date: 09/04/2008  . Smokeless tobacco: Former Systems developer    Types: Chew  . Alcohol Use: 0.0 oz/week    0 Standard drinks or equivalent per week     Comment: Occasionally  . Drug Use: No  . Sexual Activity: Not on file   Other Topics Concern  . Not on file   Social History Narrative   Married 50+ years, 2 kids   [place] to play golf    Family History  Problem Relation Age of Onset  . Diabetes Mother   . Stroke Father     Allergies  Allergen Reactions  . Penicillins Itching  . Ace Inhibitors Other (See Comments)  cough  . Aspirin     H/o GI bleed  . Celebrex [Celecoxib]     GI bleed  . Lipitor [Atorvastatin]     myalgias    Medication list reviewed and updated in full in [place].   GEN: No acute illnesses, no fevers, chills. GI: No n/v/d, eating normally Pulm: No SOB Interactive and getting along well at home.  Otherwise, ROS is as per the HPI.  Objective:   BP 114/50 mmHg  Pulse 75  Temp(Src) 97.8 F (36.6 C) (Oral)  Ht 5\' 5"  (1.651 m)  Wt 156 lb 8 oz (70.988 kg)  BMI 26.04 kg/m2  GEN: WDWN, NAD, Non-toxic, A & O x 3 HEENT: Atraumatic, Normocephalic. Neck supple. No masses, No LAD. No bleeding.  Ears and Nose: No external deformity. CV: RRR, No M/G/R. No JVD. No thrill. No extra heart  sounds. PULM: CTA B, no wheezes, crackles, rhonchi. No retractions. No resp. distress. No accessory muscle use. EXTR: No c/c/e NEURO Normal gait.  PSYCH: Normally interactive. Conversant. Not depressed or anxious appearing.  Calm demeanor.   Laboratory and Imaging Data:  Assessment and Plan:   Gums, bleeding - Plan: CBC with Differential/Platelet, Protime-INR, CANCELED: Protime-INR  Chronic anticoagulation - Plan: CBC with Differential/Platelet, Protime-INR, CANCELED: Protime-INR  Other fatigue - Plan: CBC with Differential/Platelet, CANCELED: Protime-INR  Check labs to make sure he has not become anemic secondary to bleeding.  I'm not sure exactly what to do in terms of his gum bleeding.  Ultimately, I think that we would need to defer to his vascular surgeon in terms of anticoagulation.  Other anticoagulants would be potentially possible, but they also increase risk of bleeding, too  New Prescriptions   No medications on file   Orders Placed This Encounter  Procedures  . CBC with Differential/Platelet  . Protime-INR    Signed,  Frederico Hamman T. Copland, MD   Patient's Medications  New Prescriptions   No medications on file  Previous Medications   AMLODIPINE (NORVASC) 5 MG TABLET    Take 1 tablet (5 mg total) by mouth daily.   CLOPIDOGREL (PLAVIX) 75 MG TABLET    TAKE 1 TABLET EVERY DAY   COENZYME Q10 (CO Q 10) 100 MG CAPS    Take 1 capsule by mouth daily.     LOSARTAN (COZAAR) 100 MG TABLET    Take 1 tablet (100 mg total) by mouth daily.   PRAVASTATIN (PRAVACHOL) 40 MG TABLET    Take 1 tablet (40 mg total) by mouth daily.  Modified Medications   Modified Medication Previous Medication   TRAZODONE (DESYREL) 100 MG TABLET traZODone (DESYREL) 50 MG tablet      Take 1 tablet (100 mg total) by mouth at bedtime as needed for sleep.    Take 0.5-1 tablets (25-50 mg total) by mouth at bedtime as needed for sleep.  Discontinued Medications   FENOFIBRATE 160 MG TABLET    Take 1 tablet  (160 mg total) by mouth daily.   FERROUS SULFATE (IRON) 28 MG TABS    Take 1 tablet by mouth daily.     ROPINIROLE (REQUIP) 0.25 MG TABLET    TAKE ONE OR TWO TABLETS AT BEDTIME   ROPINIROLE (REQUIP) 0.5 MG TABLET    Take 1-2 tablets (0.5-1 mg total) by mouth at bedtime.

## 2015-06-02 NOTE — Progress Notes (Signed)
Pre visit review using our clinic review tool, if applicable. No additional management support is needed unless otherwise documented below in the visit note. 

## 2015-06-03 LAB — PROTIME-INR
INR: 0.91 (ref <1.50)
Prothrombin Time: 12.4 seconds (ref 11.6–15.2)

## 2015-06-08 DIAGNOSIS — H25093 Other age-related incipient cataract, bilateral: Secondary | ICD-10-CM | POA: Diagnosis not present

## 2015-06-21 DIAGNOSIS — Z23 Encounter for immunization: Secondary | ICD-10-CM | POA: Diagnosis not present

## 2015-06-25 DIAGNOSIS — I70213 Atherosclerosis of native arteries of extremities with intermittent claudication, bilateral legs: Secondary | ICD-10-CM | POA: Diagnosis not present

## 2015-06-25 DIAGNOSIS — I10 Essential (primary) hypertension: Secondary | ICD-10-CM | POA: Diagnosis not present

## 2015-06-25 DIAGNOSIS — I739 Peripheral vascular disease, unspecified: Secondary | ICD-10-CM | POA: Diagnosis not present

## 2015-06-25 DIAGNOSIS — E785 Hyperlipidemia, unspecified: Secondary | ICD-10-CM | POA: Diagnosis not present

## 2015-06-25 DIAGNOSIS — I701 Atherosclerosis of renal artery: Secondary | ICD-10-CM | POA: Diagnosis not present

## 2015-06-25 DIAGNOSIS — I6529 Occlusion and stenosis of unspecified carotid artery: Secondary | ICD-10-CM | POA: Diagnosis not present

## 2015-07-02 DIAGNOSIS — H18413 Arcus senilis, bilateral: Secondary | ICD-10-CM | POA: Diagnosis not present

## 2015-07-02 DIAGNOSIS — I10 Essential (primary) hypertension: Secondary | ICD-10-CM | POA: Diagnosis not present

## 2015-07-02 DIAGNOSIS — H2513 Age-related nuclear cataract, bilateral: Secondary | ICD-10-CM | POA: Diagnosis not present

## 2015-07-02 DIAGNOSIS — H02839 Dermatochalasis of unspecified eye, unspecified eyelid: Secondary | ICD-10-CM | POA: Diagnosis not present

## 2015-07-15 DIAGNOSIS — N182 Chronic kidney disease, stage 2 (mild): Secondary | ICD-10-CM | POA: Diagnosis not present

## 2015-07-15 DIAGNOSIS — I10 Essential (primary) hypertension: Secondary | ICD-10-CM | POA: Diagnosis not present

## 2015-07-26 DIAGNOSIS — Z6822 Body mass index (BMI) 22.0-22.9, adult: Secondary | ICD-10-CM | POA: Diagnosis not present

## 2015-07-26 DIAGNOSIS — N401 Enlarged prostate with lower urinary tract symptoms: Secondary | ICD-10-CM | POA: Diagnosis not present

## 2015-08-03 DIAGNOSIS — D472 Monoclonal gammopathy: Secondary | ICD-10-CM | POA: Diagnosis not present

## 2015-08-03 DIAGNOSIS — C73 Malignant neoplasm of thyroid gland: Secondary | ICD-10-CM | POA: Diagnosis not present

## 2015-08-03 DIAGNOSIS — D72829 Elevated white blood cell count, unspecified: Secondary | ICD-10-CM | POA: Diagnosis not present

## 2015-08-03 DIAGNOSIS — Z9889 Other specified postprocedural states: Secondary | ICD-10-CM | POA: Diagnosis not present

## 2015-08-03 DIAGNOSIS — N182 Chronic kidney disease, stage 2 (mild): Secondary | ICD-10-CM | POA: Diagnosis not present

## 2015-09-02 ENCOUNTER — Encounter: Payer: Self-pay | Admitting: *Deleted

## 2015-09-04 DIAGNOSIS — R0982 Postnasal drip: Secondary | ICD-10-CM | POA: Diagnosis not present

## 2015-09-04 DIAGNOSIS — J0100 Acute maxillary sinusitis, unspecified: Secondary | ICD-10-CM | POA: Diagnosis not present

## 2015-09-04 DIAGNOSIS — J0110 Acute frontal sinusitis, unspecified: Secondary | ICD-10-CM | POA: Diagnosis not present

## 2015-09-04 DIAGNOSIS — I10 Essential (primary) hypertension: Secondary | ICD-10-CM | POA: Diagnosis not present

## 2015-09-13 DIAGNOSIS — H2511 Age-related nuclear cataract, right eye: Secondary | ICD-10-CM | POA: Diagnosis not present

## 2015-09-13 DIAGNOSIS — H25811 Combined forms of age-related cataract, right eye: Secondary | ICD-10-CM | POA: Diagnosis not present

## 2015-09-13 DIAGNOSIS — Z961 Presence of intraocular lens: Secondary | ICD-10-CM | POA: Diagnosis not present

## 2015-09-14 DIAGNOSIS — H2512 Age-related nuclear cataract, left eye: Secondary | ICD-10-CM | POA: Diagnosis not present

## 2015-10-04 DIAGNOSIS — H25812 Combined forms of age-related cataract, left eye: Secondary | ICD-10-CM | POA: Diagnosis not present

## 2015-10-04 DIAGNOSIS — H2512 Age-related nuclear cataract, left eye: Secondary | ICD-10-CM | POA: Diagnosis not present

## 2015-10-04 DIAGNOSIS — Z961 Presence of intraocular lens: Secondary | ICD-10-CM | POA: Diagnosis not present

## 2015-10-29 DIAGNOSIS — E890 Postprocedural hypothyroidism: Secondary | ICD-10-CM | POA: Diagnosis not present

## 2015-10-29 DIAGNOSIS — E039 Hypothyroidism, unspecified: Secondary | ICD-10-CM | POA: Diagnosis not present

## 2015-10-29 DIAGNOSIS — C73 Malignant neoplasm of thyroid gland: Secondary | ICD-10-CM | POA: Diagnosis not present

## 2015-11-05 ENCOUNTER — Other Ambulatory Visit: Payer: Self-pay | Admitting: *Deleted

## 2015-11-05 MED ORDER — LOSARTAN POTASSIUM 100 MG PO TABS: 100.0000 mg | ORAL_TABLET | Freq: Every day | ORAL | Status: DC

## 2015-11-15 DIAGNOSIS — K1379 Other lesions of oral mucosa: Secondary | ICD-10-CM | POA: Diagnosis not present

## 2015-11-17 ENCOUNTER — Telehealth: Payer: Self-pay

## 2015-11-17 ENCOUNTER — Telehealth: Payer: Self-pay | Admitting: Family Medicine

## 2015-11-17 MED ORDER — AMLODIPINE BESYLATE 5 MG PO TABS: 5.0000 mg | ORAL_TABLET | Freq: Every day | ORAL | Status: DC

## 2015-11-17 NOTE — Telephone Encounter (Signed)
Pt wife Izora Gala called stating pt has not been seen since  09/09/2010 and was being seen by another provider.  Pt wife is requesting pt reestablish.  [employer].  Pt wife  is not sure of medications.  CB#[id number]

## 2015-11-17 NOTE — Telephone Encounter (Signed)
Please review

## 2015-11-17 NOTE — Telephone Encounter (Signed)
Pt request refill amlodipine 5 mg to [facility]; pt going out of town today until 11/23/15; pt scheduled appt with Dr Damita Dunnings on 11/24/15; pt advised refill done.

## 2015-11-18 NOTE — Telephone Encounter (Signed)
Advise wife Dr. Damita Dunnings refilled his Amlodipine and set up an appointment on 11-24-15 (as soon as Lael returns from his trip on 11-23-15). Should keep the follow up appointment with Dr. Damita Dunnings since he has a more recent relationship and information about him.

## 2015-11-18 NOTE — Telephone Encounter (Signed)
Patient advised as directed below. Patient verbalized understanding.  

## 2015-11-24 ENCOUNTER — Ambulatory Visit: Payer: Medicare Other | Admitting: Family Medicine

## 2015-11-26 DIAGNOSIS — E039 Hypothyroidism, unspecified: Secondary | ICD-10-CM | POA: Diagnosis not present

## 2015-11-26 DIAGNOSIS — C73 Malignant neoplasm of thyroid gland: Secondary | ICD-10-CM | POA: Diagnosis not present

## 2015-12-16 ENCOUNTER — Other Ambulatory Visit: Payer: Self-pay | Admitting: *Deleted

## 2015-12-16 MED ORDER — CLOPIDOGREL BISULFATE 75 MG PO TABS: 75.0000 mg | ORAL_TABLET | Freq: Every day | ORAL | Status: DC

## 2015-12-19 ENCOUNTER — Other Ambulatory Visit: Payer: Self-pay | Admitting: Family Medicine

## 2015-12-19 DIAGNOSIS — E785 Hyperlipidemia, unspecified: Secondary | ICD-10-CM

## 2015-12-19 DIAGNOSIS — D649 Anemia, unspecified: Secondary | ICD-10-CM

## 2015-12-24 ENCOUNTER — Other Ambulatory Visit (INDEPENDENT_AMBULATORY_CARE_PROVIDER_SITE_OTHER): Payer: Medicare Other

## 2015-12-24 ENCOUNTER — Ambulatory Visit (INDEPENDENT_AMBULATORY_CARE_PROVIDER_SITE_OTHER): Payer: Medicare Other

## 2015-12-24 VITALS — BP 112/54 | HR 69 | Temp 97.7°F | Ht 65.0 in | Wt 152.8 lb

## 2015-12-24 DIAGNOSIS — D649 Anemia, unspecified: Secondary | ICD-10-CM

## 2015-12-24 DIAGNOSIS — Z0000 Encounter for general adult medical examination without abnormal findings: Secondary | ICD-10-CM

## 2015-12-24 DIAGNOSIS — E785 Hyperlipidemia, unspecified: Secondary | ICD-10-CM

## 2015-12-24 LAB — CBC WITH DIFFERENTIAL/PLATELET
BASOS ABS: 0.1 10*3/uL (ref 0.0–0.1)
Basophils Relative: 0.7 % (ref 0.0–3.0)
EOS ABS: 0 10*3/uL (ref 0.0–0.7)
Eosinophils Relative: 0 % (ref 0.0–5.0)
HEMATOCRIT: 35.6 % — AB (ref 39.0–52.0)
Hemoglobin: 12 g/dL — ABNORMAL LOW (ref 13.0–17.0)
LYMPHS PCT: 20.6 % (ref 12.0–46.0)
Lymphs Abs: 2.3 10*3/uL (ref 0.7–4.0)
MCHC: 33.8 g/dL (ref 30.0–36.0)
MCV: 93.4 fl (ref 78.0–100.0)
MONOS PCT: 7.9 % (ref 3.0–12.0)
Monocytes Absolute: 0.9 10*3/uL (ref 0.1–1.0)
NEUTROS ABS: 7.8 10*3/uL — AB (ref 1.4–7.7)
NEUTROS PCT: 70.8 % (ref 43.0–77.0)
PLATELETS: 242 10*3/uL (ref 150.0–400.0)
RBC: 3.81 Mil/uL — AB (ref 4.22–5.81)
RDW: 14.2 % (ref 11.5–15.5)
WBC: 11 10*3/uL — ABNORMAL HIGH (ref 4.0–10.5)

## 2015-12-24 LAB — LIPID PANEL
Cholesterol: 177 mg/dL (ref 0–200)
HDL: 50.5 mg/dL (ref >39.00)
LDL Cholesterol: 95 mg/dL (ref 0–99)
NONHDL: 126.51
Total CHOL/HDL Ratio: 4
Triglycerides: 156 mg/dL — ABNORMAL HIGH (ref 0.0–149.0)
VLDL: 31.2 mg/dL (ref 0.0–40.0)

## 2015-12-24 LAB — COMPREHENSIVE METABOLIC PANEL
ALK PHOS: 34 U/L — AB (ref 39–117)
ALT: 16 U/L (ref 0–53)
AST: 16 U/L (ref 0–37)
Albumin: 4.1 g/dL (ref 3.5–5.2)
BILIRUBIN TOTAL: 0.5 mg/dL (ref 0.2–1.2)
BUN: 31 mg/dL — ABNORMAL HIGH (ref 6–23)
CALCIUM: 9.2 mg/dL (ref 8.4–10.5)
CO2: 26 meq/L (ref 19–32)
CREATININE: 1.2 mg/dL (ref 0.40–1.50)
Chloride: 104 mEq/L (ref 96–112)
GFR: 62.44 mL/min (ref >60.00)
GLUCOSE: 121 mg/dL — AB (ref 70–99)
Potassium: 3.9 mEq/L (ref 3.5–5.1)
Sodium: 137 mEq/L (ref 135–145)
TOTAL PROTEIN: 6.6 g/dL (ref 6.0–8.3)

## 2015-12-24 NOTE — Progress Notes (Signed)
Subjective:   Ryan Pittman is a 75 y.o. male who presents for Medicare Annual/Subsequent preventive examination.   Cardiac Risk Factors include: advanced age (>36men, >59 women);dyslipidemia;hypertension;male gender     Objective:    Vitals: BP 112/54 mmHg  Pulse 69  Temp(Src) 97.7 F (36.5 C) (Oral)  Ht 5\' 5"  (1.651 m)  Wt 152 lb 12 oz (69.287 kg)  BMI 25.42 kg/m2  SpO2 97%  Body mass index is 25.42 kg/(m^2).  Tobacco History  Smoking status  . Former Smoker  . Quit date: 09/04/2008  Smokeless tobacco  . Former Systems developer  . Types: Chew     Counseling given: No   Past Medical History  Diagnosis Date  . Hypertension   . Chronic kidney disease   . Hyperlipidemia   . Colon polyps   . Anemia     due to GIB, s/p transfusion  . Hypercalcemia     h/o, resolved as of 2012, prev due to high amount of calcium intake  . PVD (peripheral vascular disease) ([facility])     L leg bypass, R leg stented  . IgG gammopathy     stable as of 2012 per [facility]  . Arthritis     back pain, much worse after consecutive golf rounds   Past Surgical History  Procedure Laterality Date  . Bypass graft      L leg  . Thyroidectomy, partial  2016  . Colonoscopy with propofol N/A 05/04/2015    Procedure: COLONOSCOPY WITH PROPOFOL;  Surgeon: Lollie Sails, MD;  Location: [facility] ENDOSCOPY;  Service: Endoscopy;  Laterality: N/A;   Family History  Problem Relation Age of Onset  . Diabetes Mother   . Stroke Father    History  Sexual Activity  . Sexual Activity: Yes    Outpatient Encounter Prescriptions as of 12/24/2015  Medication Sig  . amLODipine (NORVASC) 5 MG tablet Take 1 tablet (5 mg total) by mouth daily.  . clopidogrel (PLAVIX) 75 MG tablet Take 1 tablet (75 mg total) by mouth daily.  . Coenzyme Q10 (CO Q 10) 100 MG CAPS Take 1 capsule by mouth daily.    [name] levothyroxine (SYNTHROID, LEVOTHROID) 100 MCG tablet Take 75 mcg by mouth daily before breakfast.  . losartan (COZAAR) 100 MG  tablet Take 1 tablet (100 mg total) by mouth daily.  . pravastatin (PRAVACHOL) 40 MG tablet Take 1 tablet (40 mg total) by mouth daily.  . traZODone (DESYREL) 100 MG tablet Take 1 tablet (100 mg total) by mouth at bedtime as needed for sleep.   No facility-administered encounter medications on file as of 12/24/2015.    Activities of Daily Living In your present state of health, do you have any difficulty performing the following activities: 12/24/2015  Hearing? N  Vision? N  Difficulty concentrating or making decisions? N  Walking or climbing stairs? N  Dressing or bathing? N  Doing errands, shopping? N  Preparing Food and eating ? N  Using the Toilet? N  In the past six months, have you accidently leaked urine? N  Do you have problems with loss of bowel control? N  Managing your Medications? N  Managing your Finances? N  Housekeeping or managing your Housekeeping? N    Patient Care Team: Tonia Ghent, MD as PCP - General (Family Medicine) Crista Curb. Gloriann Loan, MD as Consulting Physician (Ophthalmology) Algernon Huxley, MD as Referring Physician (Vascular Surgery) Dagoberto Ligas, MD as Referring Physician (Internal Medicine) Ronda Fairly. Stoiff, MD as Consulting  Physician (Urology) Macario Carls, MS, CCC-A as Consulting Physician (Audiology)   Assessment:     Hearing Screening   125Hz  250Hz  500Hz  1000Hz  2000Hz  4000Hz  8000Hz   Right ear:   0 0 40 40   Left ear:   0 0 40 0   Vision Screening Comments: Last eye exam in Feb 2017   Exercise Activities and Dietary recommendations Current Exercise Habits: Home exercise routine, Type of exercise: walking;Other - see comments (golf 4 hrs 1-2 days per week), Time (Minutes): 30, Frequency (Times/Week): 7, Weekly Exercise (Minutes/Week): 210, Intensity: Moderate  Goals    . Increase physical activity     Starting 12/24/2015, I will continue to golf for at least 4 hrs 1-2 days per week and limit intake of saturated fat.       Fall Risk Fall  Risk  12/24/2015  Falls in the past year? No   Depression Screen PHQ 2/9 Scores 12/24/2015  PHQ - 2 Score 0    Cognitive Testing MMSE - Mini Mental State Exam 12/24/2015  Orientation to time 5  Orientation to Place 5  Registration 3  Attention/ Calculation 0  Recall 3  Language- name 2 objects 0  Language- repeat 1  Language- follow 3 step command 3  Language- read & follow direction 0  Write a sentence 0  Copy design 0  Total score 20   PLEASE NOTE: A Mini-Cog screen was completed. Maximum score is 20. A value of 0 denotes this part of [place] MMSE was not completed.  Orientation to Time - Max 5 Orientation to Place - Max 5 Registration - Max 3 Recall - Max 3 Language Repeat - Max 1 Language Follow 3 Step Command - Max 3  Immunization History  Administered Date(s) Administered  . Influenza-Unspecified 06/18/2014  . Pneumococcal Conjugate-13 04/07/2014  . Pneumococcal Polysaccharide-23 04/28/2010  . Td 03/12/2012  . Zoster 06/07/2010   Screening Tests Health Maintenance  Topic Date Due  . INFLUENZA VACCINE  04/04/2016  . COLONOSCOPY  05/03/2018  . TETANUS/TDAP  03/12/2022  . ZOSTAVAX  Completed  . PNA vac Low Risk Adult  Completed      Plan:     I have personally reviewed and addressed the Medicare Annual Wellness questionnaire and have noted the following in the patient's chart:  A. Medical and social history B. Use of alcohol, tobacco or illicit drugs  C. Current medications and supplements D. Functional ability and status E.  Nutritional status F.  Physical activity G. Advance directives H. List of other physicians I.  Hospitalizations, surgeries, and ER visits in previous 12 months J.  [place] to include hearing, vision, cognitive, depression L. Referrals and appointments - none  In addition, I have reviewed and discussed with patient certain preventive protocols, quality metrics, and best practice recommendations. A written  personalized care plan for preventive services as well as general preventive health recommendations were provided to patient.  See attached scanned questionnaire for additional information.   Signed,   Lindell Noe, MHA, BS, LPN Health Advisor [phone]  I reviewed health advisor's note, was available for consultation on the day of service listed in this note, and agree with documentation and plan. Elsie Stain, MD.

## 2015-12-24 NOTE — Patient Instructions (Signed)
Mr. Kidder , Thank you for taking time to come for your Medicare Wellness Visit. I appreciate your ongoing commitment to your health goals. Please review the following plan we discussed and let me know if I can assist you in the future.   These are the goals we discussed: Goals    . Increase physical activity     Starting 12/24/2015, I will continue to golf for at least 4 hrs 1-2 days per week and limit intake of saturated fat.        This is a list of the screening recommended for you and due dates:  Health Maintenance  Topic Date Due  . Flu Shot  04/04/2016  . Colon Cancer Screening  05/03/2018  . Tetanus Vaccine  03/12/2022  . Shingles Vaccine  Completed  . Pneumonia vaccines  Completed   Preventive Care for Adults  A healthy lifestyle and preventive care can promote health and wellness. Preventive health guidelines for adults include the following key practices.  . A routine yearly physical is a good way to check with your health care provider about your health and preventive screening. It is a chance to share any concerns and updates on your health and to receive a thorough exam.  . Visit your dentist for a routine exam and preventive care every 6 months. Brush your teeth twice a day and floss once a day. Good oral hygiene prevents tooth decay and gum disease.  . The frequency of eye exams is based on your age, health, family medical history, use  of contact lenses, and other factors. Follow your health care provider's ecommendations for frequency of eye exams.  . Eat a healthy diet. Foods like vegetables, fruits, whole grains, low-fat dairy products, and lean protein foods contain the nutrients you need without too many calories. Decrease your intake of foods high in solid fats, added sugars, and salt. Eat the right amount of calories for you. Get information about a proper diet from your health care provider, if necessary.  . Regular physical exercise is one of the most  important things you can do for your health. Most adults should get at least 150 minutes of moderate-intensity exercise (any activity that increases your heart rate and causes you to sweat) each week. In addition, most adults need muscle-strengthening exercises on 2 or more days a week.  Silver Sneakers may be a benefit available to you. To determine eligibility, you may visit the website: www.silversneakers.com or contact program at 939-315-2433 Mon-Fri between 8AM-8PM.   . Maintain a healthy weight. The body mass index (BMI) is a screening tool to identify possible weight problems. It provides an estimate of body fat based on height and weight. Your health care provider can find your BMI and can help you achieve or maintain a healthy weight.   For adults 20 years and older: ? A BMI below 18.5 is considered underweight. ? A BMI of 18.5 to 24.9 is normal. ? A BMI of 25 to 29.9 is considered overweight. ? A BMI of 30 and above is considered obese.   . Maintain normal blood lipids and cholesterol levels by exercising and minimizing your intake of saturated fat. Eat a balanced diet with plenty of fruit and vegetables. Blood tests for lipids and cholesterol should begin at age 62 and be repeated every 5 years. If your lipid or cholesterol levels are high, you are over 50, or you are at high risk for heart disease, you may need your cholesterol levels checked  more frequently. Ongoing high lipid and cholesterol levels should be treated with medicines if diet and exercise are not working.  . If you smoke, find out from your health care provider how to quit. If you do not use tobacco, please do not start.  . If you choose to drink alcohol, please do not consume more than 2 drinks per day. One drink is considered to be 12 ounces (355 mL) of beer, 5 ounces (148 mL) of wine, or 1.5 ounces (44 mL) of liquor.  . If you are 12-72 years old, ask your health care provider if you should take aspirin to prevent  strokes.  . Use sunscreen. Apply sunscreen liberally and repeatedly throughout the day. You should seek shade when your shadow is shorter than you. Protect yourself by wearing long sleeves, pants, a wide-brimmed hat, and sunglasses year round, whenever you are outdoors.  . Once a month, do a whole body skin exam, using a mirror to look at the skin on your back. Tell your health care provider of new moles, moles that have irregular borders, moles that are larger than a pencil eraser, or moles that have changed in shape or color.

## 2015-12-24 NOTE — Progress Notes (Signed)
Pre visit review using our clinic review tool, if applicable. No additional management support is needed unless otherwise documented below in the visit note. 

## 2015-12-27 ENCOUNTER — Encounter: Payer: Self-pay | Admitting: Family Medicine

## 2015-12-27 ENCOUNTER — Ambulatory Visit (INDEPENDENT_AMBULATORY_CARE_PROVIDER_SITE_OTHER): Payer: Medicare Other | Admitting: Family Medicine

## 2015-12-27 VITALS — BP 138/54 | HR 74 | Temp 98.2°F | Ht 65.0 in | Wt 156.0 lb

## 2015-12-27 DIAGNOSIS — N400 Enlarged prostate without lower urinary tract symptoms: Secondary | ICD-10-CM

## 2015-12-27 DIAGNOSIS — E785 Hyperlipidemia, unspecified: Secondary | ICD-10-CM | POA: Diagnosis not present

## 2015-12-27 DIAGNOSIS — I10 Essential (primary) hypertension: Secondary | ICD-10-CM | POA: Diagnosis not present

## 2015-12-27 DIAGNOSIS — R739 Hyperglycemia, unspecified: Secondary | ICD-10-CM | POA: Diagnosis not present

## 2015-12-27 MED ORDER — TAMSULOSIN HCL 0.4 MG PO CAPS: 0.4000 mg | ORAL_CAPSULE | Freq: Every day | ORAL | Status: DC

## 2015-12-27 MED ORDER — PRAVASTATIN SODIUM 40 MG PO TABS: 40.0000 mg | ORAL_TABLET | Freq: Every day | ORAL | Status: DC

## 2015-12-27 MED ORDER — CLOPIDOGREL BISULFATE 75 MG PO TABS: 75.0000 mg | ORAL_TABLET | Freq: Every day | ORAL | Status: DC

## 2015-12-27 MED ORDER — LOSARTAN POTASSIUM 100 MG PO TABS: 100.0000 mg | ORAL_TABLET | Freq: Every day | ORAL | Status: DC

## 2015-12-27 MED ORDER — AMLODIPINE BESYLATE 5 MG PO TABS: 5.0000 mg | ORAL_TABLET | Freq: Every day | ORAL | Status: DC

## 2015-12-27 NOTE — Patient Instructions (Addendum)
More golf, less sweets.  Recheck labs in about 3 months, before a visit.  Don't change your regular meds but add on flomax.  See if that helps with urination, especially at night.  If you get lightheaded then stop the amlodipine and update me.  Take care.  Glad to see you.

## 2015-12-27 NOTE — Progress Notes (Signed)
Pre visit review using our clinic review tool, if applicable. No additional management support is needed unless otherwise documented below in the visit note.  Hypertension:    Using medication without problems or lightheadedness: yes Chest pain with exertion:no Edema: only after surgery on L leg, stable per patient.  Short of breath:no Labs d/w pt.    Elevated Cholesterol: Using medications without problems:yes Muscle aches: not from statin.   Diet compliance:yes, "Up and down."  Encourged.   Exercise:yes, he wants to play more golf.   Labs d/w pt.    Heme per [facility].  D/w pt.    Thyroid per [facility].   D/w pt.   "I feel bad in the morning, better after I get up."  He doesn't sleep well from LUTS and sx night, nocturia 3-4x/night.  Stream is slower then prev.  No pain with urination.  Sx going like this for several years.  He has seen Dr. Bernardo Heater with uro prev, last year.   Hyperglycemia, d/w pt.  FH DM2 noted.  "Everybody on my mother's side had it (DM2)."  Labs d/w pt.    Meds, vitals, and allergies reviewed.   PMH and SH reviewed  ROS: See HPI.  Otherwise negative.    GEN: nad, alert and oriented HEENT: mucous membranes moist NECK: supple w/o LA CV: rrr. PULM: ctab, no inc wob ABD: soft, +bs EXT: no edema on R leg, minimal on L leg, at baseline.  SKIN: no acute rash

## 2015-12-28 DIAGNOSIS — E785 Hyperlipidemia, unspecified: Secondary | ICD-10-CM | POA: Insufficient documentation

## 2015-12-28 DIAGNOSIS — R739 Hyperglycemia, unspecified: Secondary | ICD-10-CM | POA: Insufficient documentation

## 2015-12-28 NOTE — Assessment & Plan Note (Signed)
Reasonable control, continue as is.  No change in meds.  

## 2015-12-28 NOTE — Assessment & Plan Note (Signed)
Reasonable control, continue as is.  No change in meds.  >25 minutes spent in face to face time with patient, >50% spent in counselling or coordination of care

## 2015-12-28 NOTE — Assessment & Plan Note (Signed)
FH DM2 noted. "Everybody on my mother's side had it (DM2)." Labs d/w pt.  Work on diet and weight and recheck labs later in 2017.  He agrees.

## 2015-12-28 NOTE — Assessment & Plan Note (Signed)
More nocturia, d/w pt.  Add on flomax with routine cautions and he'll update me.  That may help his sleep.

## 2015-12-31 DIAGNOSIS — E785 Hyperlipidemia, unspecified: Secondary | ICD-10-CM | POA: Diagnosis not present

## 2015-12-31 DIAGNOSIS — I10 Essential (primary) hypertension: Secondary | ICD-10-CM | POA: Diagnosis not present

## 2015-12-31 DIAGNOSIS — I70219 Atherosclerosis of native arteries of extremities with intermittent claudication, unspecified extremity: Secondary | ICD-10-CM | POA: Diagnosis not present

## 2015-12-31 DIAGNOSIS — I70213 Atherosclerosis of native arteries of extremities with intermittent claudication, bilateral legs: Secondary | ICD-10-CM | POA: Diagnosis not present

## 2015-12-31 DIAGNOSIS — I6529 Occlusion and stenosis of unspecified carotid artery: Secondary | ICD-10-CM | POA: Diagnosis not present

## 2015-12-31 DIAGNOSIS — I739 Peripheral vascular disease, unspecified: Secondary | ICD-10-CM | POA: Diagnosis not present

## 2015-12-31 DIAGNOSIS — I701 Atherosclerosis of renal artery: Secondary | ICD-10-CM | POA: Diagnosis not present

## 2016-01-19 DIAGNOSIS — R809 Proteinuria, unspecified: Secondary | ICD-10-CM | POA: Diagnosis not present

## 2016-01-19 DIAGNOSIS — N182 Chronic kidney disease, stage 2 (mild): Secondary | ICD-10-CM | POA: Diagnosis not present

## 2016-01-19 DIAGNOSIS — I10 Essential (primary) hypertension: Secondary | ICD-10-CM | POA: Diagnosis not present

## 2016-03-17 ENCOUNTER — Other Ambulatory Visit (INDEPENDENT_AMBULATORY_CARE_PROVIDER_SITE_OTHER): Payer: Medicare Other

## 2016-03-17 DIAGNOSIS — R739 Hyperglycemia, unspecified: Secondary | ICD-10-CM | POA: Diagnosis not present

## 2016-03-17 LAB — HEMOGLOBIN A1C: Hgb A1c MFr Bld: 5.8 % (ref 4.6–6.5)

## 2016-03-17 LAB — GLUCOSE, RANDOM: GLUCOSE: 104 mg/dL — AB (ref 70–99)

## 2016-03-22 ENCOUNTER — Other Ambulatory Visit: Payer: Medicare Other

## 2016-03-27 ENCOUNTER — Ambulatory Visit (INDEPENDENT_AMBULATORY_CARE_PROVIDER_SITE_OTHER): Payer: Medicare Other | Admitting: Family Medicine

## 2016-03-27 ENCOUNTER — Encounter: Payer: Self-pay | Admitting: Family Medicine

## 2016-03-27 DIAGNOSIS — R739 Hyperglycemia, unspecified: Secondary | ICD-10-CM | POA: Diagnosis not present

## 2016-03-27 DIAGNOSIS — G4700 Insomnia, unspecified: Secondary | ICD-10-CM

## 2016-03-27 DIAGNOSIS — I10 Essential (primary) hypertension: Secondary | ICD-10-CM

## 2016-03-27 MED ORDER — AMLODIPINE BESYLATE 5 MG PO TABS: 2.5000 mg | ORAL_TABLET | Freq: Every day | ORAL | Status: DC

## 2016-03-27 MED ORDER — TRAZODONE HCL 100 MG PO TABS: 100.0000 mg | ORAL_TABLET | Freq: Every evening | ORAL | 12 refills | Status: DC | PRN

## 2016-03-27 NOTE — Patient Instructions (Signed)
Recheck in about 6 months.  We can do labs at the visit, if needed.  Cut back on the sweets and potatoes.  Cut the amlodipine back to 1/2 tab a day, down to 2.5mg  a day.  That should help with being lightheaded.  Take care.  Glad to see you.  Update me as needed.

## 2016-03-27 NOTE — Progress Notes (Signed)
Pre visit review using our clinic review tool, if applicable. No additional management support is needed unless otherwise documented below in the visit note.  Hypertension:    Using medication without problems or lightheadedness:  Occ lightheaded.  Chest pain with exertion:no Edema:occ joint swelling from OA at baseline.   Short of breath:no  Sugar elevation d/w pt.  Cutting back on some sweets.  D/w pt about cutting back on some carbs.  A1c controlled at <6.   Insomnia.  Needs refill on trazodone.  It helps.  No ADE on med.  Doesn't sleep well w/o med, occ.    PMH and SH reviewed  ROS: Per HPI unless specifically indicated in ROS section   Meds, vitals, and allergies reviewed.   GEN: nad, alert and oriented HEENT: mucous membranes moist NECK: supple w/o LA CV: rrr. PULM: ctab, no inc wob ABD: soft, +bs EXT: no edema SKIN: no acute rash

## 2016-03-28 ENCOUNTER — Encounter: Payer: Self-pay | Admitting: Family Medicine

## 2016-03-28 NOTE — Assessment & Plan Note (Signed)
Dec amlodpine to 2.5mg  a day and he'll update me as needed.  He agrees.

## 2016-03-28 NOTE — Assessment & Plan Note (Signed)
D/w pt about cutting back on some carbs.  A1c controlled at <6. D/w pt.

## 2016-03-28 NOTE — Assessment & Plan Note (Signed)
Needs refill on trazodone.  It helps.  No ADE on med.  Doesn't sleep well w/o med, occ.   continue med.

## 2016-04-10 DIAGNOSIS — E785 Hyperlipidemia, unspecified: Secondary | ICD-10-CM | POA: Diagnosis not present

## 2016-04-10 DIAGNOSIS — I739 Peripheral vascular disease, unspecified: Secondary | ICD-10-CM | POA: Diagnosis not present

## 2016-04-10 DIAGNOSIS — I6523 Occlusion and stenosis of bilateral carotid arteries: Secondary | ICD-10-CM | POA: Diagnosis not present

## 2016-04-10 DIAGNOSIS — I70219 Atherosclerosis of native arteries of extremities with intermittent claudication, unspecified extremity: Secondary | ICD-10-CM | POA: Diagnosis not present

## 2016-04-10 DIAGNOSIS — I70213 Atherosclerosis of native arteries of extremities with intermittent claudication, bilateral legs: Secondary | ICD-10-CM | POA: Diagnosis not present

## 2016-04-10 DIAGNOSIS — I701 Atherosclerosis of renal artery: Secondary | ICD-10-CM | POA: Diagnosis not present

## 2016-04-10 DIAGNOSIS — I10 Essential (primary) hypertension: Secondary | ICD-10-CM | POA: Diagnosis not present

## 2016-04-18 DIAGNOSIS — C44311 Basal cell carcinoma of skin of nose: Secondary | ICD-10-CM | POA: Diagnosis not present

## 2016-04-18 DIAGNOSIS — D485 Neoplasm of uncertain behavior of skin: Secondary | ICD-10-CM | POA: Diagnosis not present

## 2016-04-18 DIAGNOSIS — Z85828 Personal history of other malignant neoplasm of skin: Secondary | ICD-10-CM | POA: Diagnosis not present

## 2016-04-18 DIAGNOSIS — Z08 Encounter for follow-up examination after completed treatment for malignant neoplasm: Secondary | ICD-10-CM | POA: Diagnosis not present

## 2016-04-18 DIAGNOSIS — X32XXXA Exposure to sunlight, initial encounter: Secondary | ICD-10-CM | POA: Diagnosis not present

## 2016-04-18 DIAGNOSIS — L570 Actinic keratosis: Secondary | ICD-10-CM | POA: Diagnosis not present

## 2016-04-18 DIAGNOSIS — L728 Other follicular cysts of the skin and subcutaneous tissue: Secondary | ICD-10-CM | POA: Diagnosis not present

## 2016-05-16 DIAGNOSIS — Z79899 Other long term (current) drug therapy: Secondary | ICD-10-CM | POA: Diagnosis not present

## 2016-05-16 DIAGNOSIS — E890 Postprocedural hypothyroidism: Secondary | ICD-10-CM | POA: Diagnosis not present

## 2016-05-16 DIAGNOSIS — C73 Malignant neoplasm of thyroid gland: Secondary | ICD-10-CM | POA: Diagnosis not present

## 2016-05-19 DIAGNOSIS — Z23 Encounter for immunization: Secondary | ICD-10-CM | POA: Diagnosis not present

## 2016-06-06 DIAGNOSIS — L814 Other melanin hyperpigmentation: Secondary | ICD-10-CM | POA: Diagnosis not present

## 2016-06-06 DIAGNOSIS — L578 Other skin changes due to chronic exposure to nonionizing radiation: Secondary | ICD-10-CM | POA: Diagnosis not present

## 2016-06-06 DIAGNOSIS — L908 Other atrophic disorders of skin: Secondary | ICD-10-CM | POA: Diagnosis not present

## 2016-06-06 DIAGNOSIS — C44310 Basal cell carcinoma of skin of unspecified parts of face: Secondary | ICD-10-CM | POA: Insufficient documentation

## 2016-06-06 DIAGNOSIS — C44311 Basal cell carcinoma of skin of nose: Secondary | ICD-10-CM | POA: Diagnosis not present

## 2016-07-03 ENCOUNTER — Other Ambulatory Visit (INDEPENDENT_AMBULATORY_CARE_PROVIDER_SITE_OTHER): Payer: Self-pay | Admitting: Vascular Surgery

## 2016-07-03 DIAGNOSIS — I701 Atherosclerosis of renal artery: Secondary | ICD-10-CM

## 2016-07-03 DIAGNOSIS — I70313 Atherosclerosis of unspecified type of bypass graft(s) of the extremities with intermittent claudication, bilateral legs: Secondary | ICD-10-CM

## 2016-07-04 ENCOUNTER — Ambulatory Visit (INDEPENDENT_AMBULATORY_CARE_PROVIDER_SITE_OTHER): Payer: Medicare Other

## 2016-07-04 ENCOUNTER — Encounter (INDEPENDENT_AMBULATORY_CARE_PROVIDER_SITE_OTHER): Payer: Self-pay | Admitting: Vascular Surgery

## 2016-07-04 ENCOUNTER — Ambulatory Visit (INDEPENDENT_AMBULATORY_CARE_PROVIDER_SITE_OTHER): Payer: Medicare Other | Admitting: Vascular Surgery

## 2016-07-04 VITALS — BP 125/57 | HR 59 | Resp 16 | Ht 67.0 in | Wt 158.0 lb

## 2016-07-04 DIAGNOSIS — I701 Atherosclerosis of renal artery: Secondary | ICD-10-CM

## 2016-07-04 DIAGNOSIS — I70313 Atherosclerosis of unspecified type of bypass graft(s) of the extremities with intermittent claudication, bilateral legs: Secondary | ICD-10-CM

## 2016-07-04 DIAGNOSIS — I6523 Occlusion and stenosis of bilateral carotid arteries: Secondary | ICD-10-CM | POA: Diagnosis not present

## 2016-07-04 DIAGNOSIS — I150 Renovascular hypertension: Secondary | ICD-10-CM | POA: Diagnosis not present

## 2016-07-04 DIAGNOSIS — N183 Chronic kidney disease, stage 3 unspecified: Secondary | ICD-10-CM

## 2016-07-04 DIAGNOSIS — I10 Essential (primary) hypertension: Secondary | ICD-10-CM

## 2016-07-04 DIAGNOSIS — I739 Peripheral vascular disease, unspecified: Secondary | ICD-10-CM | POA: Diagnosis not present

## 2016-07-04 DIAGNOSIS — E785 Hyperlipidemia, unspecified: Secondary | ICD-10-CM | POA: Diagnosis not present

## 2016-07-04 NOTE — Assessment & Plan Note (Signed)
He is currently not having lifestyle limiting claudication, ischemic rest pain, or ulceration. His ABIs today remains normal at 1.1 on the right 0.94 on the left with good wave forms. Duplex shows a patent stent in the right SFA, a patent left femoral to popliteal bypass, and mildly elevated velocities in the common femoral artery bilaterally and proximal SFA on the right. His has not worsened from his previous studies. No intervention currently required. Recheck in 6 months.

## 2016-07-04 NOTE — Progress Notes (Signed)
MRN : [id number]  Ryan Pittman is a 75 y.o. (1941/07/10) male who presents with chief complaint of  Chief Complaint  Patient presents with  . Re-evaluation    6 month follow up  .  History of Present Illness: Patient returns today in follow up of Multiple vascular issues. He is doing well without specific complaints today. He reports that his blood pressure control remains very good. His renal function is stable as far as he knows. Duplex today shows borderline greater than 60% stenosis bilaterally with the left being slightly more than the right but neither being particularly high grade. He also follows up for his known peripheral arterial disease. He is currently not having lifestyle limiting claudication, ischemic rest pain, or ulceration. His ABIs today remains normal at 1.1 on the right 0.94 on the left with good wave forms. Duplex shows a patent stent in the right SFA, a patent left femoral to popliteal bypass, and mildly elevated velocities in the common femoral artery bilaterally and proximal SFA on the right. His has not worsened from his previous studies.  Current Outpatient Prescriptions  Medication Sig Dispense Refill  . amLODipine (NORVASC) 5 MG tablet Take 0.5 tablets (2.5 mg total) by mouth daily.    . clopidogrel (PLAVIX) 75 MG tablet Take 1 tablet (75 mg total) by mouth daily. 90 tablet 3  . Coenzyme Q10 (CO Q 10) 100 MG CAPS Take 1 capsule by mouth daily.      [name] HYDROcodone-acetaminophen (NORCO/VICODIN) 5-325 MG tablet Take by mouth.    . levothyroxine (SYNTHROID, LEVOTHROID) 100 MCG tablet Take 75 mcg by mouth daily before breakfast.    . losartan (COZAAR) 100 MG tablet Take 1 tablet (100 mg total) by mouth daily. 90 tablet 3  . pravastatin (PRAVACHOL) 40 MG tablet Take 1 tablet (40 mg total) by mouth daily. 90 tablet 3  . tamsulosin (FLOMAX) 0.4 MG CAPS capsule Take 1 capsule (0.4 mg total) by mouth daily. 90 capsule 3  . traZODone (DESYREL) 100 MG tablet Take 1  tablet (100 mg total) by mouth at bedtime as needed for sleep. 30 tablet 12  . vitamin E 400 UNIT capsule Take by mouth.     No current facility-administered medications for this visit.     Past Medical History:  Diagnosis Date  . Anemia    due to GIB, s/p transfusion  . Arthritis    back pain, much worse after consecutive golf rounds  . Chronic kidney disease   . Colon polyps   . Hypercalcemia    h/o, resolved as of 2012, prev due to high amount of calcium intake  . Hyperlipidemia   . Hypertension   . IgG gammopathy    stable as of 2012 per [facility]  . PVD (peripheral vascular disease) ([place])    L leg bypass, R leg stented    Past Surgical History:  Procedure Laterality Date  . BYPASS GRAFT     L leg  . COLONOSCOPY WITH PROPOFOL N/A 05/04/2015   Procedure: COLONOSCOPY WITH PROPOFOL;  Surgeon: Lollie Sails, MD;  Location: [facility] ENDOSCOPY;  Service: Endoscopy;  Laterality: N/A;  . THYROIDECTOMY, PARTIAL  2016    Social History Social History  Substance Use Topics  . Smoking status: Former Smoker    Quit date: 09/04/2008  . Smokeless tobacco: Former Systems developer    Types: Chew  . Alcohol use 0.0 oz/week     Comment: Occasionally     Family History Family History  Problem Relation Age of Onset  . Diabetes Mother   . Stroke Father   . Colon cancer Neg Hx   . Prostate cancer Neg Hx     Allergies  Allergen Reactions  . Penicillins Itching  . Ace Inhibitors Other (See Comments)    cough  . Aspirin     H/o GI bleed  . Celebrex [Celecoxib]     GI bleed  . Lipitor [Atorvastatin]     myalgias     REVIEW OF SYSTEMS (Negative unless checked)  Constitutional: [] Weight loss  [] Fever  [] Chills Cardiac: [] Chest pain   [] Chest pressure   [] Palpitations   [] Shortness of breath when laying flat   [] Shortness of breath at rest   [] Shortness of breath with exertion. Vascular:  [x] Pain in legs with walking   [] Pain in legs at rest   [] Pain in legs when laying flat    [] Claudication   [] Pain in feet when walking  [] Pain in feet at rest  [] Pain in feet when laying flat   [] History of DVT   [] Phlebitis   [] Swelling in legs   [] Varicose veins   [] Non-healing ulcers Pulmonary:   [] Uses home oxygen   [] Productive cough   [] Hemoptysis   [] Wheeze  [] COPD   [] Asthma Neurologic:  [] Dizziness  [] Blackouts   [] Seizures   [] History of stroke   [] History of TIA  [] Aphasia   [] Temporary blindness   [] Dysphagia   [] Weakness or numbness in arms   [] Weakness or numbness in legs Musculoskeletal:  [] Arthritis   [] Joint swelling   [] Joint pain   [] Low back pain Hematologic:  [] Easy bruising  [] Easy bleeding   [] Hypercoagulable state   [] Anemic   Gastrointestinal:  [] Blood in stool   [] Vomiting blood  [] Gastroesophageal reflux/heartburn   [] Abdominal pain Genitourinary:  [x] Chronic kidney disease   [] Difficult urination  [] Frequent urination  [] Burning with urination   [] Hematuria Skin:  [] Rashes   [] Ulcers   [] Wounds Psychological:  [] History of anxiety   []  History of major depression.  Physical Examination  BP (!) 125/57 (BP Location: Right Arm)   Pulse (!) 59   Resp 16   Ht 5\' 7"  (1.702 m)   Wt 158 lb (71.7 kg)   BMI 24.75 kg/m  Gen:  WD/WN, NAD Head: [place]/AT, No temporalis wasting. Ear/Nose/Throat: Hearing grossly intact, nares w/o erythema or drainage, trachea midline Eyes: Conjunctiva clear. Sclera non-icteric Neck: Supple.  No JVD.  Pulmonary:  Good air movement, no use of accessory muscles.  Cardiac: RRR, normal S1, S2 Vascular:  Vessel Right Left  Radial Palpable Palpable  Ulnar Palpable Palpable  Brachial Palpable Palpable  Carotid Palpable, without bruit Palpable, without bruit  Aorta Not palpable N/A  Femoral Palpable Palpable  Popliteal Palpable Not Palpable  PT Palpable Palpable  DP 1+ Palpable 1+ Palpable   Gastrointestinal: soft, non-tender/non-distended. No guarding/reflex.  Musculoskeletal: M/S 5/5 throughout.  No deformity or atrophy. no  edema. Neurologic: Sensation grossly intact in extremities.  Symmetrical.  Speech is fluent.  Psychiatric: Judgment intact, Mood & affect appropriate for pt's clinical situation. Dermatologic: No rashes or ulcers noted.  No cellulitis or open wounds. Lymph : No Cervical, Axillary, or Inguinal lymphadenopathy.      Labs No results found for this or any previous visit (from the past 2160 hour(s)).  Radiology No results found.   Assessment/Plan  Carotid stenosis Checked earlier this year and stable. To be checked next year.  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 Pressure was stable. No intervention currently be planned. Plan to recheck in 6 months.  PVD (peripheral vascular disease) He is currently not having lifestyle limiting claudication, ischemic rest pain, or ulceration. His ABIs today remains normal at 1.1 on the right 0.94 on the left with good wave forms. Duplex shows a patent stent in the right SFA, a patent left femoral to popliteal bypass, and mildly elevated velocities in the common femoral artery bilaterally and proximal SFA on the right. His has not worsened from his previous studies. No intervention currently required. Recheck in 6 months.    Leotis Pain, MD  07/04/2016 11:10 AM    This note was created with Dragon medical transcription system.  Any errors from dictation are purely unintentional

## 2016-07-04 NOTE — Assessment & Plan Note (Signed)
Duplex today shows borderline greater than 60% stenosis bilaterally with the left being slightly more than the right but neither being particularly high grade. Pressure was stable. No intervention currently be planned. Plan to recheck in 6 months.

## 2016-07-04 NOTE — Assessment & Plan Note (Signed)
BP stable at this point

## 2016-07-04 NOTE — Assessment & Plan Note (Signed)
Checked earlier this year and stable. To be checked next year.

## 2016-07-04 NOTE — Assessment & Plan Note (Signed)
lipid control important in reducing the progression of atherosclerotic disease. Continue statin therapy  

## 2016-07-19 DIAGNOSIS — I10 Essential (primary) hypertension: Secondary | ICD-10-CM | POA: Diagnosis not present

## 2016-07-19 DIAGNOSIS — R809 Proteinuria, unspecified: Secondary | ICD-10-CM | POA: Diagnosis not present

## 2016-07-19 DIAGNOSIS — N182 Chronic kidney disease, stage 2 (mild): Secondary | ICD-10-CM | POA: Diagnosis not present

## 2016-08-02 DIAGNOSIS — D472 Monoclonal gammopathy: Secondary | ICD-10-CM | POA: Diagnosis not present

## 2016-08-02 DIAGNOSIS — D72829 Elevated white blood cell count, unspecified: Secondary | ICD-10-CM | POA: Diagnosis not present

## 2016-08-08 DIAGNOSIS — Z6822 Body mass index (BMI) 22.0-22.9, adult: Secondary | ICD-10-CM | POA: Diagnosis not present

## 2016-08-08 DIAGNOSIS — N401 Enlarged prostate with lower urinary tract symptoms: Secondary | ICD-10-CM | POA: Diagnosis not present

## 2016-09-06 ENCOUNTER — Encounter: Payer: Self-pay | Admitting: Family Medicine

## 2016-09-06 ENCOUNTER — Ambulatory Visit (INDEPENDENT_AMBULATORY_CARE_PROVIDER_SITE_OTHER): Payer: Medicare Other | Admitting: Family Medicine

## 2016-09-06 VITALS — BP 100/60 | HR 76 | Temp 98.3°F | Ht 65.0 in | Wt 162.2 lb

## 2016-09-06 DIAGNOSIS — J208 Acute bronchitis due to other specified organisms: Secondary | ICD-10-CM

## 2016-09-06 DIAGNOSIS — R05 Cough: Secondary | ICD-10-CM | POA: Diagnosis not present

## 2016-09-06 DIAGNOSIS — R059 Cough, unspecified: Secondary | ICD-10-CM

## 2016-09-06 MED ORDER — DOXYCYCLINE HYCLATE 100 MG PO TABS: 100.0000 mg | ORAL_TABLET | Freq: Two times a day (BID) | ORAL | 0 refills | Status: AC

## 2016-09-06 MED ORDER — GUAIFENESIN-CODEINE 100-10 MG/5ML PO SYRP: 5.0000 mL | ORAL_SOLUTION | Freq: Every evening | ORAL | 0 refills | Status: DC | PRN

## 2016-09-06 NOTE — Progress Notes (Signed)
Dr. Frederico Hamman T. Haidynn Almendarez, MD, [facility] [place], [address] Phone: [phone] Fax: [phone]  09/06/2016  Patient: Ryan Pittman, MRN: [id number], DOB: [birth year], 76 y.o.  Primary Physician:  Elsie Stain, MD   Chief Complaint  Patient presents with  . Cough    makes chest hurt  . Nasal Congestion   Subjective:   Ryan Pittman is a 76 y.o. very pleasant male patient who presents with the following:  10 days - chest hurts and waking up 5 or 6 times a night. Nothing will come up.  Chest is hurting pretty bad with coughing.  Some muscle aching Mild nasal congestion Sx at least 1 1/2 weeks.  Trouble getting up in the morning.   Past Medical History, Surgical History, Social History, Family History, Problem List, Medications, and Allergies have been reviewed and updated if relevant.  Patient Active Problem List   Diagnosis Date Noted  . 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 Renal artery stenosis ([place]) 07/04/2016  . Hyperglycemia 12/28/2015  . Hyperlipidemia 12/28/2015  . Thyroid nodule 04/29/2014  . RLS (restless legs syndrome) 11/16/2013  . Insomnia 06/18/2013  . Medicare annual wellness visit, subsequent 03/12/2012  . HTN (hypertension) 08/14/2011  . BPH (benign prostatic hyperplasia) 04/28/2011  . CKD (chronic kidney disease) stage 3, GFR 30-59 ml/min 02/13/2011  . OA (osteoarthritis) 02/13/2011  . Carotid stenosis 02/13/2011  . ED (erectile dysfunction) 02/13/2011  . Duodenal ulcer 02/13/2011  . Diverticulosis 02/13/2011  . PVD (peripheral vascular disease) ([place]) 01/26/2011  . S/P bypass graft of extremity 01/26/2011  . Pure hypercholesterolemia 01/26/2011  . Back pain 01/26/2011  . 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 Anemia    due to GIB, s/p transfusion  . Arthritis    back pain, much worse after consecutive golf rounds  . Chronic kidney disease     . Colon polyps   . Hypercalcemia    h/o, resolved as of 2012, prev due to high amount of calcium intake  . Hyperlipidemia   . Hypertension   . IgG gammopathy    stable as of 2012 per [facility]  . PVD (peripheral vascular disease) ([place])    L leg bypass, R leg stented    Past Surgical History:  Procedure Laterality Date  . BYPASS GRAFT     L leg  . COLONOSCOPY WITH PROPOFOL N/A 05/04/2015   Procedure: COLONOSCOPY WITH PROPOFOL;  Surgeon: Lollie Sails, MD;  Location: [facility] ENDOSCOPY;  Service: Endoscopy;  Laterality: N/A;  . THYROIDECTOMY, PARTIAL  2016    Social History   Social History  . Marital status: Married    Spouse name: N/A  . Number of children: N/A  . Years of education: N/A   Occupational History  . Not on file.   Social History Main Topics  . Smoking status: Former Smoker    Quit date: 09/04/2008  . Smokeless tobacco: Former Systems developer    Types: Chew  . Alcohol use 0.0 oz/week     Comment: Occasionally  . Drug use: No  . Sexual activity: Yes   Other Topics Concern  . Not on file   Social History Narrative   Married 50+ years, 2 kids   [place] to play golf    Family History  Problem Relation Age of Onset  . Diabetes Mother   . Stroke Father   . Colon cancer  Neg Hx   . Prostate cancer Neg Hx     Allergies  Allergen Reactions  . Penicillins Itching  . Ace Inhibitors Other (See Comments)    cough  . Aspirin     H/o GI bleed  . Celebrex [Celecoxib]     GI bleed  . Lipitor [Atorvastatin]     myalgias    Medication list reviewed and updated in full in [facility].  ROS: GEN: Acute illness details above GI: Tolerating PO intake GU: maintaining adequate hydration and urination Pulm: No SOB Interactive and getting along well at home.  Otherwise, ROS is as per the HPI.  Objective:   BP 100/60   Pulse 76   Temp 98.3 F (36.8 C) (Oral)   Ht 5\' 5"  (1.651 m)   Wt 162 lb 4 oz (73.6 kg)   SpO2 95%   BMI 27.00 kg/m     GEN: A and O x 3. WDWN. NAD.    ENT: Nose clear, ext NML.  No LAD.  No JVD.  TM's clear. Oropharynx clear.  PULM: Normal WOB, no distress. No crackles, wheezes, rare rhonchi. CV: RRR, no M/G/R, No rubs, No JVD.   EXT: warm and well-perfused, No c/c/e. PSYCH: Pleasant and conversant.    Laboratory and Imaging Data:  Assessment and Plan:   Acute bronchitis due to other specified organisms  Cough  Given length of time, coverage for atypicals is reasonable Supportive care  Follow-up: No Follow-up on file.  Meds ordered this encounter  Medications  . guaiFENesin-codeine (ROBITUSSIN AC) 100-10 MG/5ML syrup    Sig: Take 5 mLs by mouth at bedtime as needed for cough.    Dispense:  180 mL    Refill:  0  . doxycycline (VIBRA-TABS) 100 MG tablet    Sig: Take 1 tablet (100 mg total) by mouth 2 (two) times daily.    Dispense:  20 tablet    Refill:  0   Medications Discontinued During This Encounter  Medication Reason  . HYDROcodone-acetaminophen (NORCO/VICODIN) 5-325 MG tablet Completed Course   No orders of the defined types were placed in this encounter.   Signed,  Maud Deed. Leighla Chestnutt, MD   Allergies as of 09/06/2016      Reactions   Penicillins Itching   Ace Inhibitors Other (See Comments)   cough   Aspirin    H/o GI bleed   Celebrex [celecoxib]    GI bleed   Lipitor [atorvastatin]    myalgias      Medication List       Accurate as of 09/06/16 10:27 AM. Always use your most recent med list.          amLODipine 5 MG tablet Commonly known as:  NORVASC Take 0.5 tablets (2.5 mg total) by mouth daily.   clopidogrel 75 MG tablet Commonly known as:  PLAVIX Take 1 tablet (75 mg total) by mouth daily.   Co Q 10 100 MG Caps Take 1 capsule by mouth daily.   doxycycline 100 MG tablet Commonly known as:  VIBRA-TABS Take 1 tablet (100 mg total) by mouth 2 (two) times daily.   guaiFENesin-codeine 100-10 MG/5ML syrup Commonly known as:  ROBITUSSIN AC Take 5 mLs  by mouth at bedtime as needed for cough.   levothyroxine 100 MCG tablet Commonly known as:  SYNTHROID, LEVOTHROID Take 75 mcg by mouth daily before breakfast.   losartan 100 MG tablet Commonly known as:  COZAAR Take 1 tablet (100 mg total) by mouth daily.  pravastatin 40 MG tablet Commonly known as:  PRAVACHOL Take 1 tablet (40 mg total) by mouth daily.   tamsulosin 0.4 MG Caps capsule Commonly known as:  FLOMAX Take 1 capsule (0.4 mg total) by mouth daily.   traZODone 100 MG tablet Commonly known as:  DESYREL Take 1 tablet (100 mg total) by mouth at bedtime as needed for sleep.   vitamin E 400 UNIT capsule Take by mouth.

## 2016-09-06 NOTE — Progress Notes (Signed)
Pre visit review using our clinic review tool, if applicable. No additional management support is needed unless otherwise documented below in the visit note. 

## 2016-09-25 DIAGNOSIS — Z85828 Personal history of other malignant neoplasm of skin: Secondary | ICD-10-CM | POA: Diagnosis not present

## 2016-09-25 DIAGNOSIS — X32XXXA Exposure to sunlight, initial encounter: Secondary | ICD-10-CM | POA: Diagnosis not present

## 2016-09-25 DIAGNOSIS — D225 Melanocytic nevi of trunk: Secondary | ICD-10-CM | POA: Diagnosis not present

## 2016-09-25 DIAGNOSIS — L570 Actinic keratosis: Secondary | ICD-10-CM | POA: Diagnosis not present

## 2016-09-25 DIAGNOSIS — Z08 Encounter for follow-up examination after completed treatment for malignant neoplasm: Secondary | ICD-10-CM | POA: Diagnosis not present

## 2016-09-27 ENCOUNTER — Ambulatory Visit (INDEPENDENT_AMBULATORY_CARE_PROVIDER_SITE_OTHER): Payer: Medicare Other | Admitting: Family Medicine

## 2016-09-27 ENCOUNTER — Encounter: Payer: Self-pay | Admitting: Family Medicine

## 2016-09-27 VITALS — BP 124/64 | HR 68 | Temp 99.0°F | Wt 159.0 lb

## 2016-09-27 DIAGNOSIS — R739 Hyperglycemia, unspecified: Secondary | ICD-10-CM | POA: Diagnosis not present

## 2016-09-27 DIAGNOSIS — I10 Essential (primary) hypertension: Secondary | ICD-10-CM

## 2016-09-27 DIAGNOSIS — R05 Cough: Secondary | ICD-10-CM

## 2016-09-27 DIAGNOSIS — R059 Cough, unspecified: Secondary | ICD-10-CM

## 2016-09-27 LAB — HEMOGLOBIN A1C: Hgb A1c MFr Bld: 5.6 % (ref 4.6–6.5)

## 2016-09-27 NOTE — Assessment & Plan Note (Signed)
Recheck A1c today, see AVS. He agrees.

## 2016-09-27 NOTE — Progress Notes (Signed)
Still with cough, some sputum.  Better but not resolved.  Still a little hoarse.  No fevers.  Still with some chest congestion and chest is still sore to touch, mildly, possibly from coughing.    F/u for hyperglycemia.  Due for f/u A1c.  He has been cutting back on sweets in the meantime.    Hypertension:    Using medication without problems or lightheadedness:  Yes, improved with lower dose of CCB Chest pain with exertion: no Edema:no Short of breath:no Other issues: on lower dose of CCB now.   Meds, vitals, and allergies reviewed.   ROS: Per HPI unless specifically indicated in ROS section   GEN: nad, alert and oriented HEENT: mucous membranes moist NECK: supple w/o LA CV: rrr, sternum minimally ttp  PULM: ctab, no inc wob, no wheeze.  ABD: soft, +bs EXT: no edema

## 2016-09-27 NOTE — Assessment & Plan Note (Signed)
Controlled, continue as is.  He agrees.

## 2016-09-27 NOTE — Patient Instructions (Signed)
Go to the lab on the way out.  We'll contact you with your lab report. Ease back on candy.  Recheck in about 6 months at a physical.  Take care.  Glad to see you.

## 2016-09-27 NOTE — Assessment & Plan Note (Signed)
Slowly improving, likely post infecitious cough.  Should slowly resolve, d/w pt.  Chest wall likely sore from coughing.  Update me as needed.

## 2016-09-27 NOTE — Progress Notes (Signed)
Pre visit review using our clinic review tool, if applicable. No additional management support is needed unless otherwise documented below in the visit note. 

## 2016-11-15 DIAGNOSIS — Z79899 Other long term (current) drug therapy: Secondary | ICD-10-CM | POA: Diagnosis not present

## 2016-11-15 DIAGNOSIS — C73 Malignant neoplasm of thyroid gland: Secondary | ICD-10-CM | POA: Diagnosis not present

## 2016-11-15 DIAGNOSIS — E890 Postprocedural hypothyroidism: Secondary | ICD-10-CM | POA: Diagnosis not present

## 2016-12-22 ENCOUNTER — Encounter (INDEPENDENT_AMBULATORY_CARE_PROVIDER_SITE_OTHER): Payer: Medicare Other

## 2016-12-22 ENCOUNTER — Ambulatory Visit (INDEPENDENT_AMBULATORY_CARE_PROVIDER_SITE_OTHER): Payer: Medicare Other | Admitting: Vascular Surgery

## 2016-12-27 ENCOUNTER — Other Ambulatory Visit: Payer: Self-pay | Admitting: Family Medicine

## 2016-12-27 NOTE — Telephone Encounter (Signed)
Received refill electronically Last refill 12/27/15 #90/3 Last office visit 09/27/16 Upcoming appointment scheduled 03/29/17 See allergy/contraindication

## 2016-12-28 NOTE — Telephone Encounter (Signed)
Sent. Thanks.   

## 2017-01-09 ENCOUNTER — Other Ambulatory Visit (INDEPENDENT_AMBULATORY_CARE_PROVIDER_SITE_OTHER): Payer: Medicare Other

## 2017-01-09 ENCOUNTER — Ambulatory Visit (INDEPENDENT_AMBULATORY_CARE_PROVIDER_SITE_OTHER): Payer: Medicare Other | Admitting: Vascular Surgery

## 2017-01-09 ENCOUNTER — Encounter (INDEPENDENT_AMBULATORY_CARE_PROVIDER_SITE_OTHER): Payer: Self-pay | Admitting: Vascular Surgery

## 2017-01-09 VITALS — BP 181/71 | HR 55 | Resp 16 | Ht 67.0 in | Wt 155.0 lb

## 2017-01-09 DIAGNOSIS — E785 Hyperlipidemia, unspecified: Secondary | ICD-10-CM | POA: Diagnosis not present

## 2017-01-09 DIAGNOSIS — I701 Atherosclerosis of renal artery: Secondary | ICD-10-CM

## 2017-01-09 DIAGNOSIS — I739 Peripheral vascular disease, unspecified: Secondary | ICD-10-CM

## 2017-01-09 DIAGNOSIS — I10 Essential (primary) hypertension: Secondary | ICD-10-CM | POA: Diagnosis not present

## 2017-01-09 DIAGNOSIS — N183 Chronic kidney disease, stage 3 (moderate): Secondary | ICD-10-CM | POA: Diagnosis not present

## 2017-01-09 DIAGNOSIS — R809 Proteinuria, unspecified: Secondary | ICD-10-CM | POA: Diagnosis not present

## 2017-01-09 NOTE — Progress Notes (Signed)
Subjective:    Patient ID: Ryan Pittman, male    DOB: [birth year], 76 y.o.   MRN: [id number] Chief Complaint  Patient presents with  . Re-evaluation    6 month ultrasound folow up   Patient presents for a six month follow up for renal and PAD issues. He is without complaint. BP is elevated today however patient informs his BP is not usually elevated. Denies any issues with renal function. He denies any issues with claudication, rest pain or ulceration to the lower extremity. The patient underwent an ABI which showed Right ABI: 0.98 and Left 0.95 with triphasic tibials. Renal artery stenosis <60% bilaterally.    Review of Systems  Constitutional: Negative.   HENT: Negative.   Eyes: Negative.   Respiratory: Negative.   Cardiovascular: Negative.   Gastrointestinal: Negative.   Endocrine: Negative.   Genitourinary: Negative.   Musculoskeletal: Negative.   Skin: Negative.   Allergic/Immunologic: Negative.   Neurological: Negative.   Hematological: Negative.   Psychiatric/Behavioral: Negative.       Objective:   Physical Exam  Constitutional: He is oriented to person, place, and time. He appears well-developed and well-nourished.  HENT:  Head: Normocephalic and atraumatic.  Eyes: Conjunctivae are normal. Pupils are equal, round, and reactive to light.  Neck: Normal range of motion.  Cardiovascular: Normal rate, regular rhythm, normal heart sounds and intact distal pulses.   Pulses:      Radial pulses are 2+ on the right side, and 2+ on the left side.       Dorsalis pedis pulses are 1+ on the right side, and 1+ on the left side.       Posterior tibial pulses are 1+ on the right side, and 1+ on the left side.  Pulmonary/Chest: Effort normal.  Musculoskeletal: Normal range of motion. He exhibits no edema.  Neurological: He is alert and oriented to person, place, and time.  Skin: Skin is warm and dry.  Psychiatric: He has a normal mood and affect. His behavior is normal.  Judgment and thought content normal.  Vitals reviewed.  BP (!) 181/71 (BP Location: Right Arm)   Pulse (!) 55   Resp 16   Ht 5\' 7"  (1.702 m)   Wt 155 lb (70.3 kg)   BMI 24.28 kg/m   Past Medical History:  Diagnosis Date  . Anemia    due to GIB, s/p transfusion  . Arthritis    back pain, much worse after consecutive golf rounds  . Chronic kidney disease   . Colon polyps   . Hypercalcemia    h/o, resolved as of 2012, prev due to high amount of calcium intake  . Hyperlipidemia   . Hypertension   . IgG gammopathy    stable as of 2012 per [facility]  . PVD (peripheral vascular disease) ([facility])    L leg bypass, R leg stented   Social History   Social History  . Marital status: Married    Spouse name: N/A  . Number of children: N/A  . Years of education: N/A   Occupational History  . Not on file.   Social History Main Topics  . Smoking status: Former Smoker    Quit date: 09/04/2008  . Smokeless tobacco: Former Systems developer    Types: Chew  . Alcohol use 0.0 oz/week     Comment: Occasionally  . Drug use: No  . Sexual activity: Yes   Other Topics Concern  . Not on file   Social History Narrative  Married 50+ years, 2 kids   [place] to play golf   Past Surgical History:  Procedure Laterality Date  . BYPASS GRAFT     L leg  . COLONOSCOPY WITH PROPOFOL N/A 05/04/2015   Procedure: COLONOSCOPY WITH PROPOFOL;  Surgeon: Lollie Sails, MD;  Location: [facility] ENDOSCOPY;  Service: Endoscopy;  Laterality: N/A;  . THYROIDECTOMY, PARTIAL  2016   Family History  Problem Relation Age of Onset  . Diabetes Mother   . Stroke Father   . Colon cancer Neg Hx   . Prostate cancer Neg Hx    Allergies  Allergen Reactions  . Penicillins Itching  . Ace Inhibitors Other (See Comments)    cough  . Aspirin     H/o GI bleed  . Celebrex [Celecoxib]     GI bleed  . Lipitor [Atorvastatin]     myalgias      Assessment & Plan:  Patient presents for a six month follow up for  renal and PAD issues. He is without complaint. BP is elevated today however patient informs his BP is not usually elevated. Denies any issues with renal function. He denies any issues with claudication, rest pain or ulceration to the lower extremity. The patient underwent an ABI which showed Right ABI: 0.98 and Left 0.95 with triphasic tibials. Renal artery stenosis <60% bilaterally.   1. PVD (peripheral vascular disease) ([facility]) - Stable Stable ABI. Asymptomatic. No intervention indicated at this time.  I have discussed with the patient at length the risk factors for and pathogenesis of atherosclerotic disease and encouraged a healthy diet, regular exercise regimen and blood pressure / glucose control.  The patient was encouraged to call the office in the interim if he experiences any claudication like symptoms, rest pain or ulcers to his feet / toes. Follow up in six months.  - VAS [place] ABI WITH/WO TBI; Future  2. Renal artery stenosis ([facility]) - Stable <60% stenosis on duplex. Elevated  BP today however patient states not usually this high. Denies any changes in renal function. No intervention indicated at this time. I have discussed with the patient at length the risk factors for and pathogenesis of atherosclerotic disease and encouraged a healthy diet, regular exercise regimen and blood pressure / glucose control.  Patient was instructed to contact our office in the interim with problems such as increasing / uncontrollable hypertension or changes in kidney function. The patient expresses their understanding. Follow up six months  - VAS US RENAL ARTERY DUPLEX; Future  3. Hyperlipidemia, unspecified hyperlipidemia type - Stable Encouraged good control as its slows the progression of atherosclerotic disease  Current Outpatient Prescriptions on File Prior to Visit  Medication Sig Dispense Refill  . amLODipine (NORVASC) 5 MG tablet Take 0.5 tablets (2.5 mg total) by mouth daily.    .  clopidogrel (PLAVIX) 75 MG tablet Take 1 tablet (75 mg total) by mouth daily. 90 tablet 3  . Coenzyme Q10 (CO Q 10) 100 MG CAPS Take 1 capsule by mouth daily.      [name] levothyroxine (SYNTHROID, LEVOTHROID) 100 MCG tablet Take 75 mcg by mouth daily before breakfast.    . losartan (COZAAR) 100 MG tablet TAKE 1 TABLET EVERY DAY 90 tablet 1  . pravastatin (PRAVACHOL) 40 MG tablet Take 1 tablet (40 mg total) by mouth daily. 90 tablet 3  . tamsulosin (FLOMAX) 0.4 MG CAPS capsule Take 1 capsule (0.4 mg total) by mouth daily. 90 capsule 3  . traZODone (  DESYREL) 100 MG tablet Take 1 tablet (100 mg total) by mouth at bedtime as needed for sleep. 30 tablet 12  . vitamin E 400 UNIT capsule Take by mouth.     No current facility-administered medications on file prior to visit.     There are no Patient Instructions on file for this visit. No Follow-up on file.   Darbi Chandran A Parvin Stetzer, PA-C

## 2017-02-05 ENCOUNTER — Other Ambulatory Visit (INDEPENDENT_AMBULATORY_CARE_PROVIDER_SITE_OTHER): Payer: Self-pay | Admitting: Vascular Surgery

## 2017-02-05 DIAGNOSIS — I6523 Occlusion and stenosis of bilateral carotid arteries: Secondary | ICD-10-CM

## 2017-02-06 ENCOUNTER — Encounter (INDEPENDENT_AMBULATORY_CARE_PROVIDER_SITE_OTHER): Payer: Self-pay | Admitting: Vascular Surgery

## 2017-02-06 ENCOUNTER — Ambulatory Visit (INDEPENDENT_AMBULATORY_CARE_PROVIDER_SITE_OTHER): Payer: Medicare Other | Admitting: Vascular Surgery

## 2017-02-06 ENCOUNTER — Other Ambulatory Visit (INDEPENDENT_AMBULATORY_CARE_PROVIDER_SITE_OTHER): Payer: Medicare Other

## 2017-02-06 ENCOUNTER — Ambulatory Visit (INDEPENDENT_AMBULATORY_CARE_PROVIDER_SITE_OTHER): Payer: Medicare Other

## 2017-02-06 VITALS — BP 138/68 | HR 59 | Resp 16 | Ht 67.0 in | Wt 156.0 lb

## 2017-02-06 DIAGNOSIS — I701 Atherosclerosis of renal artery: Secondary | ICD-10-CM | POA: Diagnosis not present

## 2017-02-06 DIAGNOSIS — E785 Hyperlipidemia, unspecified: Secondary | ICD-10-CM | POA: Diagnosis not present

## 2017-02-06 DIAGNOSIS — I739 Peripheral vascular disease, unspecified: Secondary | ICD-10-CM

## 2017-02-06 DIAGNOSIS — I6523 Occlusion and stenosis of bilateral carotid arteries: Secondary | ICD-10-CM | POA: Diagnosis not present

## 2017-02-06 NOTE — Progress Notes (Signed)
Subjective:    Patient ID: Ryan Pittman, male    DOB: [birth year], 76 y.o.   MRN: [id number] Chief Complaint  Patient presents with  . Carotid    1 year follow up ultrasound   Patient presents for 6 month follow-up. The patient presents to review vascular studies. The patient is without complaint today. Patient underwent a bilateral carotid artery duplex exam which was notable for 1-39% left internal carotid artery stenosis and right 40-59% right internal carotid artery stenosis. This is unchanged from the previous exam on 04/10/2016. The patient underwent an ABI. ABI was notable for 0.82 on the right and 0.81 on the left. This is a decrease when compared to ABI on 07/04/2016 which were right 1.16 and left 0.97 the patient underwent a bilateral lower extremity arterial duplex exam which is noted for increasing elevated velocity is throughout the right lower extremity. Left lower extremity has a patent graft. The patient denies any bilateral induction claudication and rest pain or ulcerations to his leg. He continues to be symptom free. He remains active and works daily.   Review of Systems  Constitutional: Negative.   HENT: Negative.   Eyes: Negative.   Respiratory: Negative.   Cardiovascular: Negative.   Gastrointestinal: Negative.   Endocrine: Negative.   Genitourinary: Negative.   Musculoskeletal: Negative.   Skin: Negative.   Allergic/Immunologic: Negative.   Neurological: Negative.   Hematological: Negative.   Psychiatric/Behavioral: Negative.       Objective:   Physical Exam  Constitutional: He is oriented to person, place, and time. He appears well-developed and well-nourished. No distress.  HENT:  Head: Normocephalic and atraumatic.  Eyes: Conjunctivae are normal. Pupils are equal, round, and reactive to light.  Neck: Normal range of motion.  No carotid bruits noted bilaterally  Cardiovascular: Normal rate, regular rhythm, normal heart sounds and intact distal pulses.    Pulses:      Radial pulses are 2+ on the right side, and 2+ on the left side.       Dorsalis pedis pulses are 1+ on the right side, and 1+ on the left side.       Posterior tibial pulses are 1+ on the right side, and 1+ on the left side.  Pulmonary/Chest: Effort normal.  Musculoskeletal: Normal range of motion. He exhibits no edema.  Neurological: He is alert and oriented to person, place, and time.  Skin: Skin is warm and dry. He is not diaphoretic.  Psychiatric: He has a normal mood and affect. His behavior is normal. Judgment and thought content normal.  Vitals reviewed.   BP 138/68   Pulse (!) 59   Resp 16   Ht 5\' 7"  (1.702 m)   Wt 156 lb (70.8 kg)   BMI 24.43 kg/m   Past Medical History:  Diagnosis Date  . Anemia    due to GIB, s/p transfusion  . Arthritis    back pain, much worse after consecutive golf rounds  . Chronic kidney disease   . Colon polyps   . Hypercalcemia    h/o, resolved as of 2012, prev due to high amount of calcium intake  . Hyperlipidemia   . Hypertension   . IgG gammopathy    stable as of 2012 per [facility]  . PVD (peripheral vascular disease) ([facility])    L leg bypass, R leg stented    Social History   Social History  . Marital status: Married    Spouse name: N/A  . Number of children:  N/A  . Years of education: N/A   Occupational History  . Not on file.   Social History Main Topics  . Smoking status: Former Smoker    Quit date: 09/04/2008  . Smokeless tobacco: Former Systems developer    Types: Chew  . Alcohol use 0.0 oz/week     Comment: Occasionally  . Drug use: No  . Sexual activity: Yes   Other Topics Concern  . Not on file   Social History Narrative   Married 50+ years, 2 kids   Runs [employer]   Likes to play golf    Past Surgical History:  Procedure Laterality Date  . BYPASS GRAFT     L leg  . COLONOSCOPY WITH PROPOFOL N/A 05/04/2015   Procedure: COLONOSCOPY WITH PROPOFOL;  Surgeon: Lollie Sails, MD;  [name]  ENDOSCOPY;  Service: Endoscopy;  Laterality: N/A;  . THYROIDECTOMY, PARTIAL  2016    Family History  Problem Relation Age of Onset  . Diabetes Mother   . Stroke Father   . Colon cancer Neg Hx   . Prostate cancer Neg Hx     Allergies  Allergen Reactions  . Penicillins Itching  . Ace Inhibitors Other (See Comments)    cough  . Aspirin     H/o GI bleed  . Celebrex [Celecoxib]     GI bleed  . Lipitor [Atorvastatin]     myalgias       Assessment & Plan:  Patient presents for 6 month follow-up. The patient presents to review vascular studies. The patient is without complaint today. Patient underwent a bilateral carotid artery duplex exam which was notable for 1-39% left internal carotid artery stenosis and right 40-59% right internal carotid artery stenosis. This is unchanged from the previous exam on 04/10/2016. The patient underwent an ABI. ABI was notable for 0.82 on the right and 0.81 on the left. This is a decrease when compared to ABI on 07/04/2016 which were right 1.16 and left 0.97 the patient underwent a bilateral lower extremity arterial duplex exam which is noted for increasing elevated velocity is throughout the right lower extremity. Left lower extremity has a patent graft. The patient denies any bilateral induction claudication and rest pain or ulcerations to his leg. He continues to be symptom free. He remains active and works daily.  1. Bilateral carotid artery stenosis- Stable Studies reviewed with patient. Patient asymptomatic with stable duplex.  No intervention at this time. Patient to return in one year for surveillance carotid duplex. Patient to continue medical optimization with Plavix and dyslipidemia medication. Patient to remain abstinent of tobacco use. I have discussed with the patient at length the risk factors for and pathogenesis of atherosclerotic disease and encouraged a healthy diet, regular exercise regimen and blood pressure / glucose control.    Patient was instructed to contact our office in the interim with problems such as arm / leg weakness or numbness, speech / swallowing difficulty or temporary monocular blindness. The patient expresses their understanding.  - VAS US CAROTID; Future  2. PVD (peripheral vascular disease) ([place]) - Stable Studies reviewed with patient. Patient is asymptomatic at the time There has been a decrease in the patient's ABIs.  There has been an increase in right lower extremity velocities on duplex. Patent left bypass. No intervention indicated at this time however will will bring patient back in 6 months for close follow-up. Patient to continue medical optimization with Plavix and dyslipidemia medication. Patient to remain abstinent of tobacco use. I  have discussed with the patient at length the risk factors for and pathogenesis of atherosclerotic disease and encouraged a healthy diet, regular exercise regimen and blood pressure / glucose control.  The patient was encouraged to call the office in the interim if he experiences any claudication like symptoms, rest pain or ulcers to his feet / toes.  - VAS [place] ABI WITH/WO TBI; Future - VAS [place] LOWER EXTREMITY ARTERIAL DUPLEX; Future  3. Hyperlipidemia, unspecified hyperlipidemia type - Stable Encouraged good control as its slows the progression of atherosclerotic disease  Current Outpatient Prescriptions on File Prior to Visit  Medication Sig Dispense Refill  . amLODipine (NORVASC) 5 MG tablet Take 0.5 tablets (2.5 mg total) by mouth daily.    . clopidogrel (PLAVIX) 75 MG tablet Take 1 tablet (75 mg total) by mouth daily. 90 tablet 3  . Coenzyme Q10 (CO Q 10) 100 MG CAPS Take 1 capsule by mouth daily.      [name] levothyroxine (SYNTHROID, LEVOTHROID) 100 MCG tablet Take 75 mcg by mouth daily before breakfast.    . losartan (COZAAR) 100 MG tablet TAKE 1 TABLET EVERY DAY 90 tablet 1  . pravastatin (PRAVACHOL) 40 MG tablet Take 1 tablet (40 mg total) by mouth  daily. 90 tablet 3  . tamsulosin (FLOMAX) 0.4 MG CAPS capsule Take 1 capsule (0.4 mg total) by mouth daily. (Patient taking differently: Take 0.4 mg by mouth daily. ) 90 capsule 3  . traZODone (DESYREL) 100 MG tablet Take 1 tablet (100 mg total) by mouth at bedtime as needed for sleep. 30 tablet 12  . vitamin E 400 UNIT capsule Take by mouth.     No current facility-administered medications on file prior to visit.     There are no Patient Instructions on file for this visit. No Follow-up on file.   KIMBERLY A STEGMAYER, PA-C

## 2017-03-02 ENCOUNTER — Other Ambulatory Visit: Payer: Self-pay | Admitting: Family Medicine

## 2017-03-09 NOTE — Progress Notes (Signed)
Subjective:   Ryan Pittman is a 76 y.o. male who presents for Medicare Annual/Subsequent preventive examination.  Review of Systems:  No ROS.  Medicare Wellness Visit. Additional risk factors are reflected in the social history.  Cardiac Risk Factors include: advanced age (>80men, >57 women);hypertension;male gender;dyslipidemia     Objective:    Vitals: BP (!) 150/64   Pulse 80   Ht 5\' 5"  (1.651 m)   Wt 157 lb 4 oz (71.3 kg)   SpO2 98%   BMI 26.17 kg/m   Body mass index is 26.17 kg/m.  Tobacco History  Smoking Status  . Former Smoker  . Quit date: 09/04/2008  Smokeless Tobacco  . Former Systems developer  . Types: Chew     Counseling given: Not Answered   Past Medical History:  Diagnosis Date  . Anemia    due to GIB, s/p transfusion  . Arthritis    back pain, much worse after consecutive golf rounds  . Chronic kidney disease   . Colon polyps   . Hypercalcemia    h/o, resolved as of 2012, prev due to high amount of calcium intake  . Hyperlipidemia   . Hypertension   . IgG gammopathy    stable as of 2012 per [facility]  . PVD (peripheral vascular disease) ([place])    L leg bypass, R leg stented   Past Surgical History:  Procedure Laterality Date  . BYPASS GRAFT     L leg  . COLONOSCOPY WITH PROPOFOL N/A 05/04/2015   Procedure: COLONOSCOPY WITH PROPOFOL;  Surgeon: Lollie Sails, MD;  Location: [facility] ENDOSCOPY;  Service: Endoscopy;  Laterality: N/A;  . THYROIDECTOMY, PARTIAL  2016   Family History  Problem Relation Age of Onset  . Diabetes Mother   . Stroke Father   . Colon cancer Neg Hx   . Prostate cancer Neg Hx    History  Sexual Activity  . Sexual activity: Yes    Outpatient Encounter Prescriptions as of 03/22/2017  Medication Sig  . amLODipine (NORVASC) 5 MG tablet Take 0.5 tablets (2.5 mg total) by mouth daily.  [name] amLODipine (NORVASC) 5 MG tablet TAKE 1 TABLET EVERY DAY  . clopidogrel (PLAVIX) 75 MG tablet Take 1 tablet (75 mg total) by mouth daily.  .  Coenzyme Q10 (CO Q 10) 100 MG CAPS Take 1 capsule by mouth daily.    [name] levothyroxine (SYNTHROID, LEVOTHROID) 100 MCG tablet Take 75 mcg by mouth daily before breakfast.  . losartan (COZAAR) 100 MG tablet TAKE 1 TABLET EVERY DAY  . pravastatin (PRAVACHOL) 40 MG tablet TAKE 1 TABLET EVERY DAY  . traZODone (DESYREL) 100 MG tablet Take 1 tablet (100 mg total) by mouth at bedtime as needed for sleep.  . vitamin E 400 UNIT capsule Take by mouth.  . tamsulosin (FLOMAX) 0.4 MG CAPS capsule Take 1 capsule (0.4 mg total) by mouth daily. (Patient not taking: Reported on 03/22/2017)   No facility-administered encounter medications on file as of 03/22/2017.     Activities of Daily Living In your present state of health, do you have any difficulty performing the following activities: 03/22/2017  Hearing? N  Vision? N  Difficulty concentrating or making decisions? N  Walking or climbing stairs? N  Dressing or bathing? N  Doing errands, shopping? N  Preparing Food and eating ? N  Using the Toilet? N  In the past six months, have you accidently leaked urine? N  Do you have problems with loss of bowel control? N  Managing your Medications? N  Managing your Finances? N  Housekeeping or managing your Housekeeping? N  Some recent data might be hidden    Patient Care Team: Tonia Ghent, MD as PCP - General (Family Medicine) Lorelee Cover., MD as Consulting Physician (Ophthalmology) Lucky Cowboy, Erskine Squibb, MD as Referring Physician (Vascular Surgery) Dagoberto Ligas, MD as Referring Physician (Internal Medicine) Ronda Fairly. Elenor Quinones, MD as Consulting Physician (Urology) Macario Carls, [place], [place] as Consulting Physician (Audiology) Lanier Clam, MD (Endocrinology)   Assessment:    Physical assessment deferred to PCP.  Exercise Activities and Dietary recommendations Current Exercise Habits: Home exercise routine, Type of exercise: Other - see comments (Golfing), Time (Minutes): > 60, Frequency (Times/Week):  1, Weekly Exercise (Minutes/Week): 0  Goals    . Increase physical activity (pt-stated)          Starting 03/22/2017, I will continue to golf for at least 4 hrs 1-2 days per week and limit intake of saturated fat.       Fall Risk Fall Risk  03/22/2017 12/24/2015  Falls in the past year? No No   Depression Screen PHQ 2/9 Scores 03/22/2017 12/24/2015  PHQ - 2 Score 0 0    Cognitive Function PLEASE NOTE: A Mini-Cog screen was completed. Maximum score is 20. A value of 0 denotes this part of [place] MMSE was not completed or the patient failed this part of the Mini-Cog screening.   Mini-Cog Screening Orientation to Time - Max 5 pts Orientation to Place - Max 5 pts Registration - Max 3 pts Recall - Max 3 pts Language Repeat - Max 1 pts Language Follow 3 Step Command - Max 3 pts      Mini-Cog - 03/22/17 0853    Normal clock drawing test? no   How many words correct? 1      MMSE - Mini Mental State Exam 03/22/2017 12/24/2015  Orientation to time 5 5  Orientation to Place 5 5  Registration 3 3  Attention/ Calculation 0 0  Recall 3 3  Language- name 2 objects 0 0  Language- repeat 1 1  Language- follow 3 step command 3 3  Language- read & follow direction 0 0  Write a sentence 0 0  Copy design 0 0  Total score 20 20        Immunization History  Administered Date(s) Administered  . Influenza, High Dose Seasonal PF 05/19/2016  . Influenza-Unspecified 06/18/2014  . Pneumococcal Conjugate-13 04/07/2014  . Pneumococcal Polysaccharide-23 04/28/2010  . Td 03/12/2012  . Zoster 06/07/2010   Screening Tests Health Maintenance  Topic Date Due  . INFLUENZA VACCINE  04/04/2017  . COLONOSCOPY  05/03/2018  . TETANUS/TDAP  03/12/2022  . PNA vac Low Risk Adult  Completed      Plan:    Follow-up w/ PCP as scheduled.  I have personally reviewed and noted the following in the patient's chart:   . Medical and social history . Use of alcohol, tobacco or illicit drugs   . Current medications and supplements . Functional ability and status . Nutritional status . Physical activity . Advanced directives . List of other physicians . Vitals . Screenings to include cognitive, depression, and falls . Referrals and appointments  In addition, I have reviewed and discussed with patient certain preventive protocols, quality metrics, and best practice recommendations. A written personalized care plan for preventive services as well as general preventive health recommendations were provided to patient.     Dorrene German, RN  03/22/2017   

## 2017-03-09 NOTE — Progress Notes (Signed)
PCP notes:   Health maintenance: No gaps identified.   Abnormal screenings:  Hearing-failed. Has hearing aids but does not wear them because they're uncomfortable.  Cognitive-failed clock drawing, 1/3 recall.  Patient concerns: None.   Nurse concerns: BP-has not taken medications today.   Next PCP appt: 03/29/2017 @ 2:15pm

## 2017-03-19 ENCOUNTER — Other Ambulatory Visit: Payer: Self-pay | Admitting: Family Medicine

## 2017-03-19 DIAGNOSIS — E7800 Pure hypercholesterolemia, unspecified: Secondary | ICD-10-CM

## 2017-03-22 ENCOUNTER — Other Ambulatory Visit (INDEPENDENT_AMBULATORY_CARE_PROVIDER_SITE_OTHER): Payer: Medicare Other

## 2017-03-22 ENCOUNTER — Ambulatory Visit (INDEPENDENT_AMBULATORY_CARE_PROVIDER_SITE_OTHER): Payer: Medicare Other

## 2017-03-22 VITALS — BP 150/64 | HR 80 | Ht 65.0 in | Wt 157.2 lb

## 2017-03-22 DIAGNOSIS — E7800 Pure hypercholesterolemia, unspecified: Secondary | ICD-10-CM | POA: Diagnosis not present

## 2017-03-22 DIAGNOSIS — Z0000 Encounter for general adult medical examination without abnormal findings: Secondary | ICD-10-CM | POA: Diagnosis not present

## 2017-03-22 LAB — COMPREHENSIVE METABOLIC PANEL
ALK PHOS: 43 U/L (ref 39–117)
ALT: 11 U/L (ref 0–53)
AST: 13 U/L (ref 0–37)
Albumin: 4 g/dL (ref 3.5–5.2)
BUN: 24 mg/dL — ABNORMAL HIGH (ref 6–23)
CALCIUM: 9.5 mg/dL (ref 8.4–10.5)
CO2: 25 meq/L (ref 19–32)
Chloride: 106 mEq/L (ref 96–112)
Creatinine, Ser: 0.97 mg/dL (ref 0.40–1.50)
GFR: 79.55 mL/min (ref >60.00)
GLUCOSE: 111 mg/dL — AB (ref 70–99)
POTASSIUM: 4 meq/L (ref 3.5–5.1)
Sodium: 138 mEq/L (ref 135–145)
TOTAL PROTEIN: 6.7 g/dL (ref 6.0–8.3)
Total Bilirubin: 0.6 mg/dL (ref 0.2–1.2)

## 2017-03-22 LAB — LIPID PANEL
Cholesterol: 215 mg/dL — ABNORMAL HIGH (ref 0–200)
HDL: 53.4 mg/dL (ref >39.00)
NonHDL: 161.73
TRIGLYCERIDES: 230 mg/dL — AB (ref 0.0–149.0)
Total CHOL/HDL Ratio: 4
VLDL: 46 mg/dL — AB (ref 0.0–40.0)

## 2017-03-22 LAB — LDL CHOLESTEROL, DIRECT: LDL DIRECT: 115 mg/dL

## 2017-03-22 NOTE — Progress Notes (Signed)
Medical screening examination/treatment/procedure(s) were performed by registered nurse and as non- physician practitioner I was immediately available for consultation/collaboration.  BAITY, REGINA, NP   

## 2017-03-22 NOTE — Patient Instructions (Addendum)
Ryan Pittman , Thank you for taking time to come for your Medicare Wellness Visit. I appreciate your ongoing commitment to your health goals. Please review the following plan we discussed and let me know if I can assist you in the future.   These are the goals we discussed: Goals    . Increase physical activity (pt-stated)          Starting 03/22/2017, I will continue to golf for at least 4 hrs 1-2 days per week and limit intake of saturated fat.        This is a list of the screening recommended for you and due dates:  Health Maintenance  Topic Date Due  . Flu Shot  04/04/2017  . Colon Cancer Screening  05/03/2018  . Tetanus Vaccine  03/12/2022  . Pneumonia vaccines  Completed   Preventive Care for Adults  A healthy lifestyle and preventive care can promote health and wellness. Preventive health guidelines for adults include the following key practices.  . A routine yearly physical is a good way to check with your health care provider about your health and preventive screening. It is a chance to share any concerns and updates on your health and to receive a thorough exam.  . Visit your dentist for a routine exam and preventive care every 6 months. Brush your teeth twice a day and floss once a day. Good oral hygiene prevents tooth decay and gum disease.  . The frequency of eye exams is based on your age, health, family medical history, use  of contact lenses, and other factors. Follow your health care provider's ecommendations for frequency of eye exams.  . Eat a healthy diet. Foods like vegetables, fruits, whole grains, low-fat dairy products, and lean protein foods contain the nutrients you need without too many calories. Decrease your intake of foods high in solid fats, added sugars, and salt. Eat the right amount of calories for you. Get information about a proper diet from your health care provider, if necessary.  . Regular physical exercise is one of the most important things you  can do for your health. Most adults should get at least 150 minutes of moderate-intensity exercise (any activity that increases your heart rate and causes you to sweat) each week. In addition, most adults need muscle-strengthening exercises on 2 or more days a week.  Silver Sneakers may be a benefit available to you. To determine eligibility, you may visit the website: www.silversneakers.com or contact program at [phone] Mon-Fri between 8AM-8PM.   . Maintain a healthy weight. The body mass index (BMI) is a screening tool to identify possible weight problems. It provides an estimate of body fat based on height and weight. Your health care provider can find your BMI and can help you achieve or maintain a healthy weight.   For adults 20 years and older: ? A BMI below 18.5 is considered underweight. ? A BMI of 18.5 to 24.9 is normal. ? A BMI of 25 to 29.9 is considered overweight. ? A BMI of 30 and above is considered obese.   . Maintain normal blood lipids and cholesterol levels by exercising and minimizing your intake of saturated fat. Eat a balanced diet with plenty of fruit and vegetables. Blood tests for lipids and cholesterol should begin at age 11 and be repeated every 5 years. If your lipid or cholesterol levels are high, you are over 50, or you are at high risk for heart disease, you may need your cholesterol levels checked  more frequently. Ongoing high lipid and cholesterol levels should be treated with medicines if diet and exercise are not working.  . If you smoke, find out from your health care provider how to quit. If you do not use tobacco, please do not start.  . If you choose to drink alcohol, please do not consume more than 2 drinks per day. One drink is considered to be 12 ounces (355 mL) of beer, 5 ounces (148 mL) of wine, or 1.5 ounces (44 mL) of liquor.  . If you are 52-37 years old, ask your health care provider if you should take aspirin to prevent strokes.  . Use  sunscreen. Apply sunscreen liberally and repeatedly throughout the day. You should seek shade when your shadow is shorter than you. Protect yourself by wearing long sleeves, pants, a wide-brimmed hat, and sunglasses year round, whenever you are outdoors.  . Once a month, do a whole body skin exam, using a mirror to look at the skin on your back. Tell your health care provider of new moles, moles that have irregular borders, moles that are larger than a pencil eraser, or moles that have changed in shape or color.

## 2017-03-29 ENCOUNTER — Ambulatory Visit (INDEPENDENT_AMBULATORY_CARE_PROVIDER_SITE_OTHER): Payer: Medicare Other | Admitting: Family Medicine

## 2017-03-29 ENCOUNTER — Encounter: Payer: Self-pay | Admitting: Family Medicine

## 2017-03-29 VITALS — BP 126/52 | HR 80 | Temp 98.5°F | Ht 65.0 in | Wt 157.2 lb

## 2017-03-29 DIAGNOSIS — R739 Hyperglycemia, unspecified: Secondary | ICD-10-CM | POA: Diagnosis not present

## 2017-03-29 DIAGNOSIS — N400 Enlarged prostate without lower urinary tract symptoms: Secondary | ICD-10-CM | POA: Diagnosis not present

## 2017-03-29 DIAGNOSIS — G4700 Insomnia, unspecified: Secondary | ICD-10-CM | POA: Diagnosis not present

## 2017-03-29 DIAGNOSIS — I701 Atherosclerosis of renal artery: Secondary | ICD-10-CM | POA: Diagnosis not present

## 2017-03-29 DIAGNOSIS — I10 Essential (primary) hypertension: Secondary | ICD-10-CM | POA: Diagnosis not present

## 2017-03-29 DIAGNOSIS — D472 Monoclonal gammopathy: Secondary | ICD-10-CM

## 2017-03-29 DIAGNOSIS — E039 Hypothyroidism, unspecified: Secondary | ICD-10-CM

## 2017-03-29 DIAGNOSIS — E785 Hyperlipidemia, unspecified: Secondary | ICD-10-CM | POA: Diagnosis not present

## 2017-03-29 DIAGNOSIS — Z7189 Other specified counseling: Secondary | ICD-10-CM

## 2017-03-29 MED ORDER — LOSARTAN POTASSIUM 100 MG PO TABS: 100.0000 mg | ORAL_TABLET | Freq: Every day | ORAL | 3 refills | Status: DC

## 2017-03-29 MED ORDER — PRAVASTATIN SODIUM 40 MG PO TABS: 40.0000 mg | ORAL_TABLET | Freq: Every day | ORAL | 3 refills | Status: DC

## 2017-03-29 MED ORDER — TRAZODONE HCL 100 MG PO TABS: 50.0000 mg | ORAL_TABLET | Freq: Every evening | ORAL | 12 refills | Status: DC | PRN

## 2017-03-29 MED ORDER — CLOPIDOGREL BISULFATE 75 MG PO TABS: 75.0000 mg | ORAL_TABLET | Freq: Every day | ORAL | 3 refills | Status: DC

## 2017-03-29 MED ORDER — AMLODIPINE BESYLATE 5 MG PO TABS: 5.0000 mg | ORAL_TABLET | Freq: Every day | ORAL | 3 refills | Status: DC

## 2017-03-29 NOTE — Progress Notes (Signed)
Hearing screen-failed. Has hearing aids but does not wear them because they're uncomfortable. D/w pt.  He defers, so I'll defer.    Cognitive-failed clock drawing, 1/3 recall- d/w pt.  Can read a watch today.  He still runs own business.  3/3 recall today.  He doesn't have red flag events.    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  He has yearly f/u.  I'll defer.  He agrees.    Hypertension:    Using medication without problems or lightheadedness: yes Chest pain with exertion:no Edema:no Short of breath:no BP controlled today.   Elevated Cholesterol: Using medications without problems:yes Muscle aches: no Diet compliance: "Up and down"  Encouraged.  Exercise: "Up and down"  Encouraged.  Labs d/w pt.    Hypothyroidism.  No ADE on med.  Compliant.  No neck mass.  No dysphagia.   He has f/u with endo re: h/o thyroid cancer w/o evidence of recurrence. I'll defer.  He agrees.   Insomnia- trazodone helps.  Able to take as needed, not nightly. No ADE.    Advance directive d/w pt.  Wife designated if patient were incapacitated.    LUTS.  He started flomax prev but stopped in the meatnime.  LUTS are not bothersome enough to need restart of med.  Some nocturia, he tolerates.    Meds, vitals, and allergies reviewed.   PMH and SH reviewed  ROS: Per HPI unless specifically indicated in ROS section   GEN: nad, alert and oriented HEENT: mucous membranes moist NECK: supple w/o LA CV: rrr. PULM: ctab, no inc wob ABD: soft, +bs EXT: no edema SKIN: no acute rash

## 2017-03-29 NOTE — Patient Instructions (Signed)
Don't change your meds for now.  Try sugar free candy.  Keep working on your weight.  Take care.  Glad to see you.  Update me as needed.

## 2017-03-30 ENCOUNTER — Other Ambulatory Visit: Payer: Self-pay

## 2017-03-30 DIAGNOSIS — Z7189 Other specified counseling: Secondary | ICD-10-CM | POA: Insufficient documentation

## 2017-03-30 DIAGNOSIS — E039 Hypothyroidism, unspecified: Secondary | ICD-10-CM | POA: Insufficient documentation

## 2017-03-30 NOTE — Assessment & Plan Note (Signed)
Per [facility] clinic.

## 2017-03-30 NOTE — Assessment & Plan Note (Addendum)
Not controlled due to TG elevation, d/w pt about diet and exercise, continue as is with statin.  He agrees.  Labs d/w pt.

## 2017-03-30 NOTE — Assessment & Plan Note (Signed)
LUTS are not bothersome enough to need restart of flomax.  Some nocturia, he tolerates.

## 2017-03-30 NOTE — Assessment & Plan Note (Signed)
Controlled, continue prn trazodone.

## 2017-03-30 NOTE — Assessment & Plan Note (Addendum)
Controlled, d/w pt about diet and exercise, continue as is.  He agrees.  Labs d/w pt.

## 2017-03-30 NOTE — Assessment & Plan Note (Signed)
Advance directive d/w pt.  Wife designated if patient were incapacitated.   

## 2017-03-30 NOTE — Assessment & Plan Note (Signed)
dw pt about cutting carbs, labs d/w pt.

## 2017-04-06 ENCOUNTER — Encounter (INDEPENDENT_AMBULATORY_CARE_PROVIDER_SITE_OTHER): Payer: Medicare Other

## 2017-04-06 ENCOUNTER — Ambulatory Visit (INDEPENDENT_AMBULATORY_CARE_PROVIDER_SITE_OTHER): Payer: Medicare Other | Admitting: Vascular Surgery

## 2017-04-10 ENCOUNTER — Encounter (INDEPENDENT_AMBULATORY_CARE_PROVIDER_SITE_OTHER): Payer: Medicare Other

## 2017-04-10 ENCOUNTER — Ambulatory Visit (INDEPENDENT_AMBULATORY_CARE_PROVIDER_SITE_OTHER): Payer: Medicare Other | Admitting: Vascular Surgery

## 2017-06-04 DIAGNOSIS — E890 Postprocedural hypothyroidism: Secondary | ICD-10-CM | POA: Diagnosis not present

## 2017-06-04 DIAGNOSIS — C73 Malignant neoplasm of thyroid gland: Secondary | ICD-10-CM | POA: Diagnosis not present

## 2017-07-02 DIAGNOSIS — Z23 Encounter for immunization: Secondary | ICD-10-CM | POA: Diagnosis not present

## 2017-07-10 DIAGNOSIS — N182 Chronic kidney disease, stage 2 (mild): Secondary | ICD-10-CM | POA: Diagnosis not present

## 2017-07-10 DIAGNOSIS — I10 Essential (primary) hypertension: Secondary | ICD-10-CM | POA: Diagnosis not present

## 2017-07-10 DIAGNOSIS — R809 Proteinuria, unspecified: Secondary | ICD-10-CM | POA: Diagnosis not present

## 2017-07-20 ENCOUNTER — Ambulatory Visit (INDEPENDENT_AMBULATORY_CARE_PROVIDER_SITE_OTHER): Payer: Medicare Other | Admitting: Vascular Surgery

## 2017-07-20 ENCOUNTER — Ambulatory Visit (INDEPENDENT_AMBULATORY_CARE_PROVIDER_SITE_OTHER): Payer: Medicare Other

## 2017-07-20 ENCOUNTER — Encounter (INDEPENDENT_AMBULATORY_CARE_PROVIDER_SITE_OTHER): Payer: Self-pay

## 2017-07-20 DIAGNOSIS — I701 Atherosclerosis of renal artery: Secondary | ICD-10-CM

## 2017-07-20 DIAGNOSIS — I739 Peripheral vascular disease, unspecified: Secondary | ICD-10-CM

## 2017-07-31 ENCOUNTER — Encounter (INDEPENDENT_AMBULATORY_CARE_PROVIDER_SITE_OTHER): Payer: Self-pay

## 2017-08-01 DIAGNOSIS — D2261 Melanocytic nevi of right upper limb, including shoulder: Secondary | ICD-10-CM | POA: Diagnosis not present

## 2017-08-01 DIAGNOSIS — D2272 Melanocytic nevi of left lower limb, including hip: Secondary | ICD-10-CM | POA: Diagnosis not present

## 2017-08-01 DIAGNOSIS — Z85828 Personal history of other malignant neoplasm of skin: Secondary | ICD-10-CM | POA: Diagnosis not present

## 2017-08-01 DIAGNOSIS — D225 Melanocytic nevi of trunk: Secondary | ICD-10-CM | POA: Diagnosis not present

## 2017-08-01 DIAGNOSIS — D472 Monoclonal gammopathy: Secondary | ICD-10-CM | POA: Diagnosis not present

## 2017-08-01 DIAGNOSIS — L570 Actinic keratosis: Secondary | ICD-10-CM | POA: Diagnosis not present

## 2017-08-01 DIAGNOSIS — X32XXXA Exposure to sunlight, initial encounter: Secondary | ICD-10-CM | POA: Diagnosis not present

## 2017-08-08 ENCOUNTER — Ambulatory Visit (INDEPENDENT_AMBULATORY_CARE_PROVIDER_SITE_OTHER): Payer: Medicare Other | Admitting: Vascular Surgery

## 2017-08-08 ENCOUNTER — Ambulatory Visit (INDEPENDENT_AMBULATORY_CARE_PROVIDER_SITE_OTHER): Payer: Medicare Other

## 2017-08-08 ENCOUNTER — Encounter (INDEPENDENT_AMBULATORY_CARE_PROVIDER_SITE_OTHER): Payer: Self-pay

## 2017-08-09 ENCOUNTER — Ambulatory Visit (INDEPENDENT_AMBULATORY_CARE_PROVIDER_SITE_OTHER): Payer: Medicare Other | Admitting: Urology

## 2017-08-09 ENCOUNTER — Encounter: Payer: Self-pay | Admitting: Urology

## 2017-08-09 VITALS — BP 154/66 | HR 83 | Ht 67.0 in | Wt 150.0 lb

## 2017-08-09 DIAGNOSIS — N401 Enlarged prostate with lower urinary tract symptoms: Secondary | ICD-10-CM | POA: Diagnosis not present

## 2017-08-09 NOTE — Progress Notes (Signed)
08/09/2017 10:21 AM   [name] 1940-11-20 [id number]  Referring provider: Tonia Ghent, MD [address] [place], [place] [address]  Chief Complaint  Patient presents with  . Benign Prostatic Hypertrophy    1year    HPI: 77 y.o. male presents for annual follow-up.  I last saw him at [facility] on 08/08/2016.  He has mild lower urinary tract symptoms which are not bothersome however comes for annual DRE.  He has no bothersome lower urinary tract symptoms.  He has nocturia x1-2.  Denies dysuria or gross hematuria.  Denies flank, abdominal, pelvic or scrotal pain.  He is on no medications for his voiding symptoms.   PMH: Past Medical History:  Diagnosis Date  . Anemia    due to GIB, s/p transfusion  . Arthritis    back pain, much worse after consecutive golf rounds  . Chronic kidney disease   . Colon polyps   . Hypercalcemia    h/o, resolved as of 2012, prev due to high amount of calcium intake  . Hyperlipidemia   . Hypertension   . IgG gammopathy    stable as of 2012 per [facility]  . PVD (peripheral vascular disease) ([place])    L leg bypass, R leg stented    Surgical History: Past Surgical History:  Procedure Laterality Date  . BYPASS GRAFT     L leg  . COLONOSCOPY WITH PROPOFOL N/A 05/04/2015   Procedure: COLONOSCOPY WITH PROPOFOL;  Surgeon: Lollie Sails, MD;  Location: [facility] ENDOSCOPY;  Service: Endoscopy;  Laterality: N/A;  . THYROIDECTOMY, PARTIAL  2016    Home Medications:  Allergies as of 08/09/2017      Reactions   Penicillins Itching   Ace Inhibitors Cough   Aspirin    H/o GI bleed   Celebrex [celecoxib]    GI bleed   Lipitor [atorvastatin]    myalgias      Medication List        Accurate as of 08/09/17 10:21 AM. Always use your most recent med list.          amLODipine 5 MG tablet Commonly known as:  NORVASC Take 1 tablet (5 mg total) by mouth daily.   clopidogrel 75 MG tablet Commonly known as:  PLAVIX Take 1 tablet (75 mg  total) by mouth daily.   levothyroxine 100 MCG tablet Commonly known as:  SYNTHROID, LEVOTHROID Take 75 mcg by mouth daily before breakfast.   losartan 100 MG tablet Commonly known as:  COZAAR Take 1 tablet (100 mg total) by mouth daily.   pravastatin 40 MG tablet Commonly known as:  PRAVACHOL Take 1 tablet (40 mg total) by mouth daily.   vitamin E 400 UNIT capsule Take by mouth.       Allergies:  Allergies  Allergen Reactions  . Penicillins Itching  . Ace Inhibitors Cough  . Aspirin     H/o GI bleed  . Celebrex [Celecoxib]     GI bleed  . Lipitor [Atorvastatin]     myalgias    Family History: Family History  Problem Relation Age of Onset  . Diabetes Mother   . Stroke Father   . Colon cancer Neg Hx   . Prostate cancer Neg Hx     Social History:  reports that he quit smoking about 8 years ago. He has quit using smokeless tobacco. His smokeless tobacco use included chew. He reports that he drinks alcohol. He reports that he does not use drugs.  ROS: UROLOGY Frequent Urination?: No Hard to postpone urination?: No Burning/pain with urination?: No Get up at night to urinate?: Yes Leakage of urine?: Yes Urine stream starts and stops?: No Trouble starting stream?: No Do you have to strain to urinate?: No Blood in urine?: No Urinary tract infection?: No Sexually transmitted disease?: No Injury to kidneys or bladder?: No Painful intercourse?: No Weak stream?: No Erection problems?: No Penile pain?: No  Gastrointestinal Nausea?: No Vomiting?: No Indigestion/heartburn?: Yes Diarrhea?: No Constipation?: No  Constitutional Fever: No Night sweats?: No Weight loss?: No Fatigue?: No  Skin Skin rash/lesions?: No Itching?: No  Eyes Blurred vision?: No Double vision?: No  Ears/Nose/Throat Sore throat?: No Sinus problems?: Yes  Hematologic/Lymphatic Swollen glands?: No Easy bruising?: No  Cardiovascular Leg swelling?: Yes Chest pain?:  No  Respiratory Cough?: No Shortness of breath?: No  Endocrine Excessive thirst?: No  Musculoskeletal Back pain?: Yes Joint pain?: No  Neurological Headaches?: No Dizziness?: No  Psychologic Depression?: No Anxiety?: No  Physical Exam: BP (!) 154/66   Pulse 83   Ht 5\' 7"  (1.702 m)   Wt 150 lb (68 kg)   BMI 23.49 kg/m   Constitutional:  Alert and oriented, No acute distress. HEENT: [place] AT, moist mucus membranes.  Trachea midline, no masses. Cardiovascular: No clubbing, cyanosis, or edema. Respiratory: Normal respiratory effort, no increased work of breathing. GI: Abdomen is soft, nontender, nondistended, no abdominal masses GU: No CVA tenderness.  Prostate 50 g, smooth without nodules Skin: No rashes, bruises or suspicious lesions. Lymph: No cervical or inguinal adenopathy. Neurologic: Grossly intact, no focal deficits, moving all 4 extremities. Psychiatric: Normal mood and affect.  Laboratory Data: Lab Results  Component Value Date   WBC 11.0 (H) 12/24/2015   HGB 12.0 (L) 12/24/2015   HCT 35.6 (L) 12/24/2015   MCV 93.4 12/24/2015   PLT 242.0 12/24/2015    Lab Results  Component Value Date   CREATININE 0.97 03/22/2017    Lab Results  Component Value Date   HGBA1C 5.6 09/27/2016     Assessment & Plan:    1. Benign prostatic hyperplasia with lower urinary tract symptoms, symptom details unspecified Stable lower urinary tract symptoms which are not bothersome.  Benign DRE.  He desired to continue annual follow-up.  Return in about 1 year (around 08/09/2018) for Recheck.   Abbie Sons, [place] [address], [place], [place] [address] [phone]

## 2018-01-14 DIAGNOSIS — I10 Essential (primary) hypertension: Secondary | ICD-10-CM | POA: Diagnosis not present

## 2018-01-14 DIAGNOSIS — R809 Proteinuria, unspecified: Secondary | ICD-10-CM | POA: Diagnosis not present

## 2018-01-14 DIAGNOSIS — N182 Chronic kidney disease, stage 2 (mild): Secondary | ICD-10-CM | POA: Diagnosis not present

## 2018-01-29 ENCOUNTER — Telehealth (INDEPENDENT_AMBULATORY_CARE_PROVIDER_SITE_OTHER): Payer: Self-pay | Admitting: Vascular Surgery

## 2018-01-29 NOTE — Telephone Encounter (Signed)
Patient said that since he had a dental  procedure he still has bleeding from his gums. He said he woke up one morning and his pillow had blood all over it. Wants to know if he is taking to much blood thinner. He can be reached at his home/mobile number.

## 2018-01-30 NOTE — Telephone Encounter (Signed)
Called the patient back to ask him what's going on with his gums bleeding? He states that he had a procedure done and that the bleeding started then, only at night mostly. He tried calling the dentist back to see if it was something that they could do, they told him to contact us concerning his Plavix and they told him that his problem had to be coming from the blood thinner. This patient has since gone to two different dentist offices to see if they could help. He's upset that he has paid out more than $4000 going to the dentist specialties. He still feels that no one has gotten to the bottom of his issue.

## 2018-01-30 NOTE — Telephone Encounter (Signed)
If the patient's medication list is actually correct that means he is taking Plavix.  There is no way for me to tell if he is taking "too much" Plavix.  Should be taking 75 mg once daily.  The patient should touch base with his dentist and see how his dentist would like to handle his bleeding further.

## 2018-02-06 ENCOUNTER — Ambulatory Visit (INDEPENDENT_AMBULATORY_CARE_PROVIDER_SITE_OTHER): Payer: Medicare Other | Admitting: Vascular Surgery

## 2018-02-06 ENCOUNTER — Encounter (INDEPENDENT_AMBULATORY_CARE_PROVIDER_SITE_OTHER): Payer: Medicare Other

## 2018-02-06 ENCOUNTER — Other Ambulatory Visit (INDEPENDENT_AMBULATORY_CARE_PROVIDER_SITE_OTHER): Payer: Self-pay

## 2018-02-06 DIAGNOSIS — I6523 Occlusion and stenosis of bilateral carotid arteries: Secondary | ICD-10-CM

## 2018-02-13 ENCOUNTER — Ambulatory Visit
Admission: RE | Admit: 2018-02-13 | Discharge: 2018-02-13 | Disposition: A | Discharge status: Home / Self Care | Payer: Medicare Other | Source: Ambulatory Visit | Attending: Vascular Surgery | Admitting: Vascular Surgery

## 2018-02-13 DIAGNOSIS — I6529 Occlusion and stenosis of unspecified carotid artery: Secondary | ICD-10-CM | POA: Diagnosis present

## 2018-02-13 DIAGNOSIS — I6523 Occlusion and stenosis of bilateral carotid arteries: Secondary | ICD-10-CM | POA: Diagnosis not present

## 2018-02-20 ENCOUNTER — Encounter (INDEPENDENT_AMBULATORY_CARE_PROVIDER_SITE_OTHER): Payer: Medicare Other

## 2018-02-20 ENCOUNTER — Encounter (INDEPENDENT_AMBULATORY_CARE_PROVIDER_SITE_OTHER): Payer: Self-pay | Admitting: Vascular Surgery

## 2018-02-20 ENCOUNTER — Ambulatory Visit (INDEPENDENT_AMBULATORY_CARE_PROVIDER_SITE_OTHER): Payer: Medicare Other | Admitting: Vascular Surgery

## 2018-02-20 VITALS — BP 136/65 | HR 66 | Ht 67.0 in | Wt 155.8 lb

## 2018-02-20 DIAGNOSIS — E785 Hyperlipidemia, unspecified: Secondary | ICD-10-CM | POA: Diagnosis not present

## 2018-02-20 DIAGNOSIS — I10 Essential (primary) hypertension: Secondary | ICD-10-CM | POA: Diagnosis not present

## 2018-02-20 DIAGNOSIS — I6523 Occlusion and stenosis of bilateral carotid arteries: Secondary | ICD-10-CM | POA: Diagnosis not present

## 2018-02-20 NOTE — Progress Notes (Addendum)
Subjective:    Patient ID: Ryan Pittman, male    DOB: [birth year], 77 y.o.   MRN: [id number] Chief Complaint  Patient presents with  . Follow-up    one year   Patient presents for a yearly carotid artery stenosis follow-up.  The patient underwent a carotid duplex which was conducted at [facility] radiology department which was notable for "Mild bilateral carotid atherosclerosis. No hemodynamically significant ICA stenosis. Degree of narrowing less than 50% bilaterally by ultrasound criteria. Patent antegrade vertebral flow bilaterally."  The patient has been experiencing bleeding from his gums.  The patient notes that he has been to 2 different dentist to say there is "nothing wrong with his gums" and is "probably the Plavix". The patient denies experiencing Amaurosis Fugax, TIA like symptoms or focal motor deficits.  The patient denies any shortness of breath or chest pain.  The patient denies any fever, nausea vomiting.  Review of Systems  Constitutional: Negative.   HENT: Negative.   Eyes: Negative.   Respiratory: Negative.   Cardiovascular:       Carotid artery stenosis  Gastrointestinal: Negative.   Endocrine: Negative.   Genitourinary: Negative.   Musculoskeletal: Negative.   Skin: Negative.   Allergic/Immunologic: Negative.   Neurological: Negative.   Hematological: Negative.   Psychiatric/Behavioral: Negative.       Objective:   Physical Exam  Constitutional: He is oriented to person, place, and time. He appears well-developed and well-nourished. No distress.  HENT:  Head: Normocephalic and atraumatic.  Right Ear: External ear normal.  Left Ear: External ear normal.  Eyes: Pupils are equal, round, and reactive to light. Conjunctivae and EOM are normal.  Neck: Normal range of motion.  No carotid bruits noted on exam  Cardiovascular: Normal rate, regular rhythm, normal heart sounds and intact distal pulses.  Pulses:      Radial pulses are 2+  on the right side, and 2+ on the left side.  Pulmonary/Chest: Effort normal and breath sounds normal.  Musculoskeletal: Normal range of motion. He exhibits no edema.  Neurological: He is alert and oriented to person, place, and time.  Skin: Skin is warm and dry. He is not diaphoretic.  Psychiatric: He has a normal mood and affect. His behavior is normal. Judgment and thought content normal.  Vitals reviewed.  BP 136/65 (BP Location: Right Arm, Patient Position: Sitting)   Pulse 66   Ht 5\' 7"  (1.702 m)   Wt 155 lb 12.8 oz (70.7 kg)   BMI 24.40 kg/m   Past Medical History:  Diagnosis Date  . Anemia    due to GIB, s/p transfusion  . Arthritis    back pain, much worse after consecutive golf rounds  . Chronic kidney disease   . Colon polyps   . Hypercalcemia    h/o, resolved as of 2012, prev due to high amount of calcium intake  . Hyperlipidemia   . Hypertension   . IgG gammopathy    stable as of 2012 per [facility]  . PVD (peripheral vascular disease) ([facility])    L leg bypass, R leg stented   Social History   Socioeconomic History  . Marital status: Married    Spouse name: Not on file  . Number of children: Not on file  . Years of education: Not on file  . Highest education level: Not on file  Occupational History  . Not on file  Social Needs  . Financial resource strain: Not on file  . Food  insecurity:    Worry: Not on file    Inability: Not on file  . Transportation needs:    Medical: Not on file    Non-medical: Not on file  Tobacco Use  . Smoking status: Former Smoker    Last attempt to quit: 09/04/2008    Years since quitting: 9.4  . Smokeless tobacco: Former Systems developer    Types: Chew  Substance and Sexual Activity  . Alcohol use: Yes    Alcohol/week: 0.0 oz    Comment: Occasionally  . Drug use: No  . Sexual activity: Yes  Lifestyle  . Physical activity:    Days per week: Not on file    Minutes per session: Not on file  . Stress: Not on file  Relationships  . Social  connections:    Talks on phone: Not on file    Gets together: Not on file    Attends religious service: Not on file    Active member of club or organization: Not on file    Attends meetings of clubs or organizations: Not on file    Relationship status: Not on file  . Intimate partner violence:    Fear of current or ex partner: Not on file    Emotionally abused: Not on file    Physically abused: Not on file    Forced sexual activity: Not on file  Other Topics Concern  . Not on file  Social History Narrative   Married 50+ years, 2 kids   Runs [employer]   Likes to play golf   Past Surgical History:  Procedure Laterality Date  . BYPASS GRAFT     L leg  . COLONOSCOPY WITH PROPOFOL N/A 05/04/2015   Procedure: COLONOSCOPY WITH PROPOFOL;  Surgeon: Lollie Sails, MD;  Location: [facility] ENDOSCOPY;  Service: Endoscopy;  Laterality: N/A;  . THYROIDECTOMY, PARTIAL  2016   Family History  Problem Relation Age of Onset  . Diabetes Mother   . Stroke Father   . Colon cancer Neg Hx   . Prostate cancer Neg Hx    Allergies  Allergen Reactions  . Penicillins Itching  . Ace Inhibitors Cough  . Aspirin     H/o GI bleed  . Celebrex [Celecoxib]     GI bleed  . Lipitor [Atorvastatin]     myalgias      Assessment & Plan:  Patient presents for a yearly carotid artery stenosis follow-up.  The patient underwent a carotid duplex which was conducted at [facility] radiology department which was notable for "Mild bilateral carotid atherosclerosis. No hemodynamically significant ICA stenosis. Degree of narrowing less than 50% bilaterally by ultrasound criteria. Patent antegrade vertebral flow bilaterally."  The patient has been experiencing bleeding from his gums.  The patient notes that he has been to 2 different dentist to say there is "nothing wrong with his gums" and is "probably the Plavix". The patient denies experiencing Amaurosis Fugax, TIA like symptoms or focal  motor deficits.  The patient denies any shortness of breath or chest pain.  The patient denies any fever, nausea vomiting.  1. Bilateral carotid artery stenosis - Stable Since the patient is experiencing bleeding from his gums which two  different his dentist say are not common disease related I have recommended the patient stop the Plavix and replace it by an enteric-coated 81 mg aspirin daily.  If the patient experiences any abdominal pain, nausea vomiting bright red blood or dark-colored stools he is to stop taking the  aspirin immediately and call our office.  He expresses understanding. Studies reviewed with patient. Patient asymptomatic with stable duplex.  No intervention at this time.  Patient to return in one for surveillance carotid duplex.  I recommended a 2-year follow-up due to the patient's lack of hemodynamically significant stenosis however the patient insisted on undergoing a duplex yearly. Patient to continue medical optimization with ASA and dyslipidemia medication. Patient to remain abstinent of tobacco use. I have discussed with the patient at length the risk factors for and pathogenesis of atherosclerotic disease and encouraged a healthy diet, regular exercise regimen and blood pressure / glucose control.  Patient was instructed to contact our office in the interim with problems such as arm / leg weakness or numbness, speech / swallowing difficulty or temporary monocular blindness. The patient expresses their understanding.   - VAS US CAROTID; Future  2. Hyperlipidemia, unspecified hyperlipidemia type - Stable Encouraged good control as its slows the progression of atherosclerotic disease  3. Essential hypertension - Stable Encouraged good control as its slows the progression of atherosclerotic disease  Current Outpatient Medications on File Prior to Visit  Medication Sig Dispense Refill  . amLODipine (NORVASC) 5 MG tablet Take 1 tablet (5 mg total) by mouth daily. 90  tablet 3  . clopidogrel (PLAVIX) 75 MG tablet Take 1 tablet (75 mg total) by mouth daily. 90 tablet 3  . levothyroxine (SYNTHROID, LEVOTHROID) 100 MCG tablet Take 75 mcg by mouth daily before breakfast.    . losartan (COZAAR) 100 MG tablet Take 1 tablet (100 mg total) by mouth daily. 90 tablet 3  . pravastatin (PRAVACHOL) 40 MG tablet Take 1 tablet (40 mg total) by mouth daily. 90 tablet 3  . vitamin E 400 UNIT capsule Take by mouth.     No current facility-administered medications on file prior to visit.    There are no Patient Instructions on file for this visit. No follow-ups on file.  KIMBERLY A STEGMAYER, PA-C

## 2018-03-15 ENCOUNTER — Other Ambulatory Visit: Payer: Self-pay | Admitting: *Deleted

## 2018-03-15 MED ORDER — LOSARTAN POTASSIUM 100 MG PO TABS: 100.0000 mg | ORAL_TABLET | Freq: Every day | ORAL | 3 refills | Status: DC

## 2018-03-27 ENCOUNTER — Ambulatory Visit (INDEPENDENT_AMBULATORY_CARE_PROVIDER_SITE_OTHER): Payer: Medicare Other

## 2018-03-27 VITALS — BP 140/50 | HR 65 | Temp 97.6°F | Ht 65.0 in | Wt 154.5 lb

## 2018-03-27 DIAGNOSIS — E785 Hyperlipidemia, unspecified: Secondary | ICD-10-CM

## 2018-03-27 DIAGNOSIS — Z0000 Encounter for general adult medical examination without abnormal findings: Secondary | ICD-10-CM

## 2018-03-27 DIAGNOSIS — R739 Hyperglycemia, unspecified: Secondary | ICD-10-CM | POA: Diagnosis not present

## 2018-03-27 DIAGNOSIS — I10 Essential (primary) hypertension: Secondary | ICD-10-CM | POA: Diagnosis not present

## 2018-03-27 LAB — CBC WITH DIFFERENTIAL/PLATELET
BASOS ABS: 0.1 10*3/uL (ref 0.0–0.1)
Basophils Relative: 1 % (ref 0.0–3.0)
EOS ABS: 0.2 10*3/uL (ref 0.0–0.7)
Eosinophils Relative: 2.1 % (ref 0.0–5.0)
HEMATOCRIT: 40.1 % (ref 39.0–52.0)
Hemoglobin: 13.7 g/dL (ref 13.0–17.0)
LYMPHS PCT: 16.7 % (ref 12.0–46.0)
Lymphs Abs: 1.7 10*3/uL (ref 0.7–4.0)
MCHC: 34.1 g/dL (ref 30.0–36.0)
MCV: 92.8 fl (ref 78.0–100.0)
MONOS PCT: 7.8 % (ref 3.0–12.0)
Monocytes Absolute: 0.8 10*3/uL (ref 0.1–1.0)
NEUTROS PCT: 72.4 % (ref 43.0–77.0)
Neutro Abs: 7.4 10*3/uL (ref 1.4–7.7)
Platelets: 257 10*3/uL (ref 150.0–400.0)
RBC: 4.32 Mil/uL (ref 4.22–5.81)
RDW: 15.2 % (ref 11.5–15.5)
WBC: 10.3 10*3/uL (ref 4.0–10.5)

## 2018-03-27 LAB — LIPID PANEL
CHOLESTEROL: 203 mg/dL — AB (ref 0–200)
HDL: 55.9 mg/dL (ref >39.00)
LDL Cholesterol: 108 mg/dL — ABNORMAL HIGH (ref 0–99)
NonHDL: 146.64
TRIGLYCERIDES: 195 mg/dL — AB (ref 0.0–149.0)
Total CHOL/HDL Ratio: 4
VLDL: 39 mg/dL (ref 0.0–40.0)

## 2018-03-27 LAB — COMPREHENSIVE METABOLIC PANEL
ALBUMIN: 4.3 g/dL (ref 3.5–5.2)
ALT: 15 U/L (ref 0–53)
AST: 14 U/L (ref 0–37)
Alkaline Phosphatase: 38 U/L — ABNORMAL LOW (ref 39–117)
BILIRUBIN TOTAL: 0.5 mg/dL (ref 0.2–1.2)
BUN: 16 mg/dL (ref 6–23)
CALCIUM: 9.6 mg/dL (ref 8.4–10.5)
CO2: 28 meq/L (ref 19–32)
CREATININE: 1.06 mg/dL (ref 0.40–1.50)
Chloride: 103 mEq/L (ref 96–112)
GFR: 71.62 mL/min (ref >60.00)
Glucose, Bld: 117 mg/dL — ABNORMAL HIGH (ref 70–99)
Potassium: 4.3 mEq/L (ref 3.5–5.1)
Sodium: 139 mEq/L (ref 135–145)
Total Protein: 7.3 g/dL (ref 6.0–8.3)

## 2018-03-27 LAB — LDL CHOLESTEROL, DIRECT: Direct LDL: 112 mg/dL

## 2018-03-27 LAB — HEMOGLOBIN A1C: HEMOGLOBIN A1C: 5.7 % (ref 4.6–6.5)

## 2018-03-27 NOTE — Patient Instructions (Addendum)
Ryan Pittman , Thank you for taking time to come for your Medicare Wellness Visit. I appreciate your ongoing commitment to your health goals. Please review the following plan we discussed and let me know if I can assist you in the future.   These are the goals we discussed: Goals      Patient Stated   . Increase physical activity (pt-stated)     Starting 03/27/2018, I will continue to golf for at least 4 hrs 1-2 days per week and limit intake of saturated fat.        This is a list of the screening recommended for you and due dates:  Health Maintenance  Topic Date Due  . Flu Shot  04/04/2018  . Colon Cancer Screening  05/03/2018  . Tetanus Vaccine  03/12/2022  . Pneumonia vaccines  Completed   Preventive Care for Adults  A healthy lifestyle and preventive care can promote health and wellness. Preventive health guidelines for adults include the following key practices.  . A routine yearly physical is a good way to check with your health care provider about your health and preventive screening. It is a chance to share any concerns and updates on your health and to receive a thorough exam.  . Visit your dentist for a routine exam and preventive care every 6 months. Brush your teeth twice a day and floss once a day. Good oral hygiene prevents tooth decay and gum disease.  . The frequency of eye exams is based on your age, health, family medical history, use  of contact lenses, and other factors. Follow your health care provider's recommendations for frequency of eye exams.  . Eat a healthy diet. Foods like vegetables, fruits, whole grains, low-fat dairy products, and lean protein foods contain the nutrients you need without too many calories. Decrease your intake of foods high in solid fats, added sugars, and salt. Eat the right amount of calories for you. Get information about a proper diet from your health care provider, if necessary.  . Regular physical exercise is one of the most  important things you can do for your health. Most adults should get at least 150 minutes of moderate-intensity exercise (any activity that increases your heart rate and causes you to sweat) each week. In addition, most adults need muscle-strengthening exercises on 2 or more days a week.  Silver Sneakers may be a benefit available to you. To determine eligibility, you may visit the website: www.silversneakers.com or contact program at (234)205-8606 Mon-Fri between 8AM-8PM.   . Maintain a healthy weight. The body mass index (BMI) is a screening tool to identify possible weight problems. It provides an estimate of body fat based on height and weight. Your health care provider can find your BMI and can help you achieve or maintain a healthy weight.   For adults 20 years and older: ? A BMI below 18.5 is considered underweight. ? A BMI of 18.5 to 24.9 is normal. ? A BMI of 25 to 29.9 is considered overweight. ? A BMI of 30 and above is considered obese.   . Maintain normal blood lipids and cholesterol levels by exercising and minimizing your intake of saturated fat. Eat a balanced diet with plenty of fruit and vegetables. Blood tests for lipids and cholesterol should begin at age 55 and be repeated every 5 years. If your lipid or cholesterol levels are high, you are over 50, or you are at high risk for heart disease, you may need your cholesterol levels  checked more frequently. Ongoing high lipid and cholesterol levels should be treated with medicines if diet and exercise are not working.  . If you smoke, find out from your health care provider how to quit. If you do not use tobacco, please do not start.  . If you choose to drink alcohol, please do not consume more than 2 drinks per day. One drink is considered to be 12 ounces (355 mL) of beer, 5 ounces (148 mL) of wine, or 1.5 ounces (44 mL) of liquor.  . If you are 67-8 years old, ask your health care provider if you should take aspirin to prevent  strokes.  . Use sunscreen. Apply sunscreen liberally and repeatedly throughout the day. You should seek shade when your shadow is shorter than you. Protect yourself by wearing long sleeves, pants, a wide-brimmed hat, and sunglasses year round, whenever you are outdoors.  . Once a month, do a whole body skin exam, using a mirror to look at the skin on your back. Tell your health care provider of new moles, moles that have irregular borders, moles that are larger than a pencil eraser, or moles that have changed in shape or color.

## 2018-03-28 ENCOUNTER — Ambulatory Visit: Payer: Medicare Other

## 2018-03-28 NOTE — Progress Notes (Addendum)
PCP notes:   Health maintenance:  No gaps identified.   Abnormal screenings:   None  Patient concerns:   None  Nurse concerns:  None  Next PCP appt:   04/02/18 @ 0845  I reviewed health advisor's note, was available for consultation, and agree with documentation and plan. Loura Pardon MD

## 2018-03-28 NOTE — Progress Notes (Signed)
Subjective:   Ryan Pittman is a 77 y.o. male who presents for Medicare Annual/Subsequent preventive examination.  Review of Systems:  N/A Cardiac Risk Factors include: advanced age (>83men, >21 women);male gender;hypertension;dyslipidemia     Objective:    Vitals: BP (!) 140/50 (BP Location: Right Arm, Patient Position: Sitting, Cuff Size: Normal)   Pulse 65   Temp 97.6 F (36.4 C) (Oral)   Ht 5\' 5"  (1.651 m) Comment: no shoes  Wt 154 lb 8 oz (70.1 kg)   SpO2 97%   BMI 25.71 kg/m   Body mass index is 25.71 kg/m.  Advanced Directives 03/27/2018 03/22/2017 01/09/2017 07/04/2016 12/24/2015  Does Patient Have a Medical Advance Directive? No Yes No No No  Does patient want to make changes to medical advance directive? - No - Patient declined - - -  Would patient like information on creating a medical advance directive? No - Patient declined - - - Yes - Scientist, clinical (histocompatibility and immunogenetics) given    Tobacco Social History   Tobacco Use  Smoking Status Former Smoker  . Last attempt to quit: 09/04/2008  . Years since quitting: 9.5  Smokeless Tobacco Former Systems developer  . Types: Chew     Counseling given: No   Clinical Intake:  Pre-visit preparation completed: Yes  Pain : No/denies pain Pain Score: 0-No pain     Nutritional Status: BMI 25 -29 Overweight Nutritional Risks: None Diabetes: No  How often do you need to have someone help you when you read instructions, pamphlets, or other written materials from your doctor or pharmacy?: 1 - Never What is the last grade level you completed in school?: 12th grade  Interpreter Needed?: No  Comments: pt lives with spouse Information entered by :: LPinson, LPN  Past Medical History:  Diagnosis Date  . Anemia    due to GIB, s/p transfusion  . Arthritis    back pain, much worse after consecutive golf rounds  . Chronic kidney disease   . Colon polyps   . Hypercalcemia    h/o, resolved as of 2012, prev due to high amount of calcium intake    . Hyperlipidemia   . Hypertension   . IgG gammopathy    stable as of 2012 per [facility]  . PVD (peripheral vascular disease) ([place])    L leg bypass, R leg stented   Past Surgical History:  Procedure Laterality Date  . BYPASS GRAFT     L leg  . COLONOSCOPY WITH PROPOFOL N/A 05/04/2015   Procedure: COLONOSCOPY WITH PROPOFOL;  Surgeon: Lollie Sails, MD;  Location: [facility] ENDOSCOPY;  Service: Endoscopy;  Laterality: N/A;  . THYROIDECTOMY, PARTIAL  2016   Family History  Problem Relation Age of Onset  . Diabetes Mother   . Stroke Father   . Colon cancer Neg Hx   . Prostate cancer Neg Hx    Social History   Socioeconomic History  . Marital status: Married    Spouse name: Not on file  . Number of children: Not on file  . Years of education: Not on file  . Highest education level: Not on file  Occupational History  . Not on file  Social Needs  . Financial resource strain: Not on file  . Food insecurity:    Worry: Not on file    Inability: Not on file  . Transportation needs:    Medical: Not on file    Non-medical: Not on file  Tobacco Use  . Smoking status: Former Smoker  Last attempt to quit: 09/04/2008    Years since quitting: 9.5  . Smokeless tobacco: Former Systems developer    Types: Chew  Substance and Sexual Activity  . Alcohol use: Yes    Alcohol/week: 0.0 oz    Comment: Occasionally  . Drug use: No  . Sexual activity: Yes  Lifestyle  . Physical activity:    Days per week: Not on file    Minutes per session: Not on file  . Stress: Not on file  Relationships  . Social connections:    Talks on phone: Not on file    Gets together: Not on file    Attends religious service: Not on file    Active member of club or organization: Not on file    Attends meetings of clubs or organizations: Not on file    Relationship status: Not on file  Other Topics Concern  . Not on file  Social History Narrative   Married 50+ years, 2 kids   [place] to play golf     Outpatient Encounter Medications as of 03/27/2018  Medication Sig  . amLODipine (NORVASC) 5 MG tablet Take 1 tablet (5 mg total) by mouth daily.  . clopidogrel (PLAVIX) 75 MG tablet Take by mouth.  . levothyroxine (SYNTHROID, LEVOTHROID) 100 MCG tablet Take 75 mcg by mouth daily before breakfast.  . losartan (COZAAR) 100 MG tablet Take 1 tablet (100 mg total) by mouth daily.  . pravastatin (PRAVACHOL) 40 MG tablet Take 1 tablet (40 mg total) by mouth daily.  . traZODone (DESYREL) 100 MG tablet Take by mouth.  . vitamin E 400 UNIT capsule Take by mouth.   No facility-administered encounter medications on file as of 03/27/2018.     Activities of Daily Living In your present state of health, do you have any difficulty performing the following activities: 03/27/2018  Hearing? Y  Vision? N  Difficulty concentrating or making decisions? N  Walking or climbing stairs? N  Dressing or bathing? N  Doing errands, shopping? N  Preparing Food and eating ? N  Using the Toilet? N  In the past six months, have you accidently leaked urine? N  Do you have problems with loss of bowel control? N  Managing your Medications? N  Managing your Finances? N  Housekeeping or managing your Housekeeping? N  Some recent data might be hidden    Patient Care Team: Tonia Ghent, MD as PCP - General (Family Medicine) Lorelee Cover., MD as Consulting Physician (Ophthalmology) Lucky Cowboy, Erskine Squibb, MD as Referring Physician (Vascular Surgery) Dagoberto Ligas, MD as Referring Physician (Internal Medicine) Ronda Fairly. Stoiff, MD as Consulting Physician (Urology) Macario Carls, [place], CCC-A as Consulting Physician (Audiology) Lanier Clam, MD (Endocrinology)   Assessment:   This is a routine wellness examination for [name].  Hearing Screening Comments: Bilateral hearing aids Vision Screening Comments: Vision exam in Jan 2019   Exercise Activities and Dietary recommendations Current Exercise Habits: The  patient does not participate in regular exercise at present(golf 1-3 hours weekly), Exercise limited by: None identified  Goals      Patient Stated   . Increase physical activity (pt-stated)     Starting 03/27/2018, I will continue to golf for at least 4 hrs 1-2 days per week and limit intake of saturated fat.        Fall Risk Fall Risk  03/27/2018 03/30/2017 03/22/2017 12/24/2015  Falls in the past year? No No No No  Comment - Emmi Telephone  Survey: data to providers prior to load - -   Depression Screen PHQ 2/9 Scores 03/27/2018 03/22/2017 12/24/2015  PHQ - 2 Score 0 0 0  PHQ- 9 Score 0 - -    Cognitive Function MMSE - Mini Mental State Exam 03/27/2018 03/22/2017 12/24/2015  Orientation to time 5 5 5   Orientation to Place 5 5 5   Registration 3 3 3   Attention/ Calculation 0 0 0  Recall 3 3 3   Language- name 2 objects 0 0 0  Language- repeat 1 1 1   Language- follow 3 step command 3 3 3   Language- read & follow direction 0 0 0  Write a sentence 0 0 0  Copy design 0 0 0  Total score 20 20 20      PLEASE NOTE: A Mini-Cog screen was completed. Maximum score is 20. A value of 0 denotes this part of [place] MMSE was not completed or the patient failed this part of the Mini-Cog screening.   Mini-Cog Screening Orientation to Time - Max 5 pts Orientation to Place - Max 5 pts Registration - Max 3 pts Recall - Max 3 pts Language Repeat - Max 1 pts Language Follow 3 Step Command - Max 3 pts     Immunization History  Administered Date(s) Administered  . Influenza, High Dose Seasonal PF 05/19/2016  . Influenza-Unspecified 06/18/2014, 07/02/2017  . Pneumococcal Conjugate-13 04/07/2014  . Pneumococcal Polysaccharide-23 04/28/2010  . Td 03/12/2012  . Zoster 06/07/2010   Screening Tests Health Maintenance  Topic Date Due  . INFLUENZA VACCINE  04/04/2018  . COLONOSCOPY  05/03/2018  . TETANUS/TDAP  03/12/2022  . PNA vac Low Risk Adult  Completed      Plan:   I have personally  reviewed, addressed, and noted the following in the patient's chart:  A. Medical and social history B. Use of alcohol, tobacco or illicit drugs  C. Current medications and supplements D. Functional ability and status E.  Nutritional status F.  Physical activity G. Advance directives H. List of other physicians I.  Hospitalizations, surgeries, and ER visits in previous 12 months J.  [place] to include hearing, vision, cognitive, depression L. Referrals and appointments - none  In addition, I have reviewed and discussed with patient certain preventive protocols, quality metrics, and best practice recommendations. A written personalized care plan for preventive services as well as general preventive health recommendations were provided to patient.  See attached scanned questionnaire for additional information.   Signed,   Lindell Noe, MHA, BS, LPN Health Coach

## 2018-03-28 NOTE — Progress Notes (Signed)
Addendum created due to encounter being closed prior to documentation.

## 2018-04-02 ENCOUNTER — Ambulatory Visit (INDEPENDENT_AMBULATORY_CARE_PROVIDER_SITE_OTHER)
Admission: RE | Admit: 2018-04-02 | Discharge: 2018-04-02 | Disposition: A | Discharge status: Home / Self Care | Payer: Medicare Other | Source: Ambulatory Visit | Attending: Family Medicine | Admitting: Family Medicine

## 2018-04-02 ENCOUNTER — Ambulatory Visit (INDEPENDENT_AMBULATORY_CARE_PROVIDER_SITE_OTHER): Payer: Medicare Other | Admitting: Family Medicine

## 2018-04-02 ENCOUNTER — Encounter: Payer: Self-pay | Admitting: Family Medicine

## 2018-04-02 VITALS — BP 140/60 | HR 79 | Temp 97.9°F | Ht 65.0 in | Wt 154.5 lb

## 2018-04-02 DIAGNOSIS — N183 Chronic kidney disease, stage 3 unspecified: Secondary | ICD-10-CM

## 2018-04-02 DIAGNOSIS — I10 Essential (primary) hypertension: Secondary | ICD-10-CM

## 2018-04-02 DIAGNOSIS — R0789 Other chest pain: Secondary | ICD-10-CM

## 2018-04-02 DIAGNOSIS — E039 Hypothyroidism, unspecified: Secondary | ICD-10-CM

## 2018-04-02 DIAGNOSIS — I6523 Occlusion and stenosis of bilateral carotid arteries: Secondary | ICD-10-CM

## 2018-04-02 DIAGNOSIS — E785 Hyperlipidemia, unspecified: Secondary | ICD-10-CM | POA: Diagnosis not present

## 2018-04-02 DIAGNOSIS — G2581 Restless legs syndrome: Secondary | ICD-10-CM | POA: Diagnosis not present

## 2018-04-02 DIAGNOSIS — Z0000 Encounter for general adult medical examination without abnormal findings: Secondary | ICD-10-CM

## 2018-04-02 DIAGNOSIS — Z7189 Other specified counseling: Secondary | ICD-10-CM

## 2018-04-02 DIAGNOSIS — D472 Monoclonal gammopathy: Secondary | ICD-10-CM

## 2018-04-02 DIAGNOSIS — C73 Malignant neoplasm of thyroid gland: Secondary | ICD-10-CM

## 2018-04-02 DIAGNOSIS — R079 Chest pain, unspecified: Secondary | ICD-10-CM | POA: Diagnosis not present

## 2018-04-02 LAB — TSH: TSH: 1.86 u[IU]/mL (ref 0.35–4.50)

## 2018-04-02 MED ORDER — AMLODIPINE BESYLATE 2.5 MG PO TABS: 2.5000 mg | ORAL_TABLET | Freq: Every day | ORAL | 3 refills | Status: DC

## 2018-04-02 MED ORDER — PRAVASTATIN SODIUM 40 MG PO TABS
40.0000 mg | ORAL_TABLET | Freq: Every day | ORAL | 3 refills | Status: DC
Start: 2018-04-02 — End: 2019-06-10

## 2018-04-02 NOTE — Progress Notes (Signed)
Flu to be done at pharmacy or in the clinic.   Shingles 2013 PNA 2011 Tetanus 2013 Colon 2011.  D/w pt about possibly stopping screening at age 77.  I'll defer to GI.   Prostate cancer screening per uro Advance directive encouraged.  Wife designated if patient were incapacitated.    CKD.  Has seen renal, d/w pt.    Hypertension:    Using medication without problems or lightheadedness: occ lightheaded.  He occ has to skip a day and alternate ARB and CCB.   Chest pain with exertion: not clearly exertional, not worse, but he has some difficult to quantify or describe discomfort but not at time of OV.  Edema: occ LLE edema since prev surgery at baseline.   Short of breath:no His BP has usually 120s/60s at home.  He likely has some white coat effect.   Still on plavix w/o known bleeding.  No ADE on med. He has h/o PVD.    Elevated Cholesterol: Using medications without problems: yes Muscle aches: no Diet compliance: encouraged.  "I try to do good." Exercise: mainly walking at work, ~10,000 steps at work.  Some walking at home.    Hypothyroidism.  Per [facility].  I'll defer.  He agrees.    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  He agrees.  He has yearly f/u.    Insomnia.  Taking trazodone prn, it helps with sleep but may not much with RLS sx.  "It's hard to say".  He has some occ RLS sx at night.  It occ keeps him from sleeping.  Not every night but intermittent for years.    He is still working, as is his wife.  "If I wasn't working, then I wouldn't know what to do."    PMH and SH reviewed  Meds, vitals, and allergies reviewed.   ROS: Per HPI unless specifically indicated in ROS section   GEN: nad, alert and oriented HEENT: mucous membranes moist NECK: supple w/o LA CV: rrr. PULM: ctab, no inc wob ABD: soft, +bs EXT: no edema on R leg, but trace LLL edema at baseline.  SKIN: no acute rash

## 2018-04-02 NOTE — Patient Instructions (Addendum)
Go to the lab on the way out.  We'll contact you with your lab and xray report.  Cut the amlodipine back to 2.5mg  a day and see if the lightheadedness gets better.  We will call about your referral.  Rosaria Ferries or Azalee Course will call you if you don't see one of them on the way out.  Take care.  Glad to see you.

## 2018-04-04 DIAGNOSIS — R0789 Other chest pain: Secondary | ICD-10-CM | POA: Insufficient documentation

## 2018-04-04 DIAGNOSIS — Z0000 Encounter for general adult medical examination without abnormal findings: Secondary | ICD-10-CM | POA: Insufficient documentation

## 2018-04-04 NOTE — Assessment & Plan Note (Signed)
History of.  Per [facility] clinic.  I will defer.  He agrees.

## 2018-04-04 NOTE — Assessment & Plan Note (Signed)
Unclear source.  Check TSH.  See notes on labs.

## 2018-04-04 NOTE — Assessment & Plan Note (Signed)
Advance directive encouraged.  Wife designated if patient were incapacitated.

## 2018-04-04 NOTE — Assessment & Plan Note (Addendum)
EKG without acute changes.  He is difficult to quantify atypical chest pain. Still on plavix w/o known bleeding.  No ADE on med. He has h/o PVD.  Refer to cardiology.  Routine cautions given.  He agrees. >25 minutes spent in face to face time with patient, >50% spent in counselling or coordination of care.

## 2018-04-04 NOTE — Assessment & Plan Note (Signed)
Flu to be done at pharmacy or in the clinic.   Shingles 2013 PNA 2011 Tetanus 2013 Colon 2011.  D/w pt about possibly stopping screening at age 77.  I'll defer to GI.   Prostate cancer screening per uro Advance directive encouraged.  Wife designated if patient were incapacitated.

## 2018-04-04 NOTE — Assessment & Plan Note (Signed)
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  He agrees.  He has yearly f/u.

## 2018-04-04 NOTE — Assessment & Plan Note (Signed)
Occasionally lightheaded on home checks.  Discussed with patient about decreasing amlodipine to 2.5 mg a day.  Continue checking blood pressure at home and update me as needed.  He likely has whitecoat component.

## 2018-04-04 NOTE — Assessment & Plan Note (Signed)
Continue statin.  Continue work on diet and exercise.  He agrees. 

## 2018-04-04 NOTE — Assessment & Plan Note (Signed)
He is seen in the renal clinic previously.  I will defer.  He agrees.

## 2018-04-09 ENCOUNTER — Encounter: Payer: Self-pay | Admitting: Cardiovascular Disease

## 2018-04-09 ENCOUNTER — Ambulatory Visit (INDEPENDENT_AMBULATORY_CARE_PROVIDER_SITE_OTHER): Payer: Medicare Other | Admitting: Cardiovascular Disease

## 2018-04-09 VITALS — BP 144/56 | HR 72 | Ht 66.0 in | Wt 153.0 lb

## 2018-04-09 DIAGNOSIS — R0789 Other chest pain: Secondary | ICD-10-CM | POA: Diagnosis not present

## 2018-04-09 DIAGNOSIS — I200 Unstable angina: Secondary | ICD-10-CM

## 2018-04-09 NOTE — Assessment & Plan Note (Signed)
History of atypical chest pain beginning several months ago occurring on a monthly basis lasting for minutes at a time not necessarily brought on by exertion.  We will get a routine GXT to further evaluate.

## 2018-04-09 NOTE — Patient Instructions (Signed)
Medication Instructions:  Your physician recommends that you continue on your current medications as directed. Please refer to the Current Medication list given to you today.   Labwork: none  Testing/Procedures: Your physician has requested that you have an exercise tolerance test. For further information please visit HugeFiesta.[email or web address]. Please also follow instruction sheet, as given.    Follow-Up: Follow up with Dr. Gwenlyn Found as needed.   Any Other Special Instructions Will Be Listed Below (If Applicable).     If you need a refill on your cardiac medications before your next appointment, please call your pharmacy.

## 2018-04-09 NOTE — Assessment & Plan Note (Signed)
History of PAD status post unsuccessful left lower extremity endovascular revascularization with subsequent femoropopliteal bypass grafting by Dr. Lucky Cowboy.  He continues to have some claudication in his right calf.

## 2018-04-09 NOTE — Assessment & Plan Note (Signed)
History of essential hypertension her blood pressure measured at 144/56.  He is on amlodipine and losartan.  Continue current meds at current dosing.

## 2018-04-09 NOTE — Assessment & Plan Note (Signed)
History of hyperlipidemia on statin therapy. 

## 2018-04-09 NOTE — Progress Notes (Signed)
04/09/2018 [name]   Apr 10, 1941  [id number]  Primary Physician Tonia Ghent, MD Primary Cardiologist: [name] MD [name], [name]  HPI:  Ryan Pittman is a 77 y.o. mildly overweight married Caucasian male father of 2, grandfather for grandchildren who is accompanied by his wife Ryan Pittman today.  He is referred by Dr. Elsie Stain for cardiovascular evaluation because of chest pain.  His risk factors include 50 pack years of tobacco abuse having quit 8 years ago as well as treated hypertension and hyperlipidemia.  There is no family history.  He is never had a heart attack or stroke.  He does complain of some chest pain that began several months ago occurring on a monthly basis lasting minutes at a time.  He does have a history of PAD status post left femoropopliteal bypass grafting several years ago by Dr. Lucky Cowboy in [place].   Current Meds  Medication Sig  . amLODipine (NORVASC) 2.5 MG tablet Take 1 tablet (2.5 mg total) by mouth daily.  . clopidogrel (PLAVIX) 75 MG tablet Take by mouth.  . levothyroxine (SYNTHROID, LEVOTHROID) 100 MCG tablet Take 75 mcg by mouth daily before breakfast.  . losartan (COZAAR) 100 MG tablet Take 1 tablet (100 mg total) by mouth daily.  . Omega-3 Fatty Acids (FISH OIL OMEGA-3 PO) Take 1,400 Units by mouth daily.  . pravastatin (PRAVACHOL) 40 MG tablet Take 1 tablet (40 mg total) by mouth daily.  . traZODone (DESYREL) 100 MG tablet Take 100 mg by mouth as needed.   . vitamin E 400 UNIT capsule Take by mouth.     Allergies  Allergen Reactions  . Penicillins Itching  . Ace Inhibitors Cough  . Aspirin     H/o GI bleed  . Celebrex [Celecoxib]     GI bleed  . Lipitor [Atorvastatin]     myalgias    Social History   Socioeconomic History  . Marital status: Married    Spouse name: Not on file  . Number of children: Not on file  . Years of education: Not on file  . Highest education level: Not on file  Occupational  History  . Not on file  Social Needs  . Financial resource strain: Not on file  . Food insecurity:    Worry: Not on file    Inability: Not on file  . Transportation needs:    Medical: Not on file    Non-medical: Not on file  Tobacco Use  . Smoking status: Former Smoker    Last attempt to quit: 09/04/2008    Years since quitting: 9.6  . Smokeless tobacco: Former Systems developer    Types: Chew  Substance and Sexual Activity  . Alcohol use: Yes    Alcohol/week: 0.0 oz    Comment: Occasionally  . Drug use: No  . Sexual activity: Yes  Lifestyle  . Physical activity:    Days per week: Not on file    Minutes per session: Not on file  . Stress: Not on file  Relationships  . Social connections:    Talks on phone: Not on file    Gets together: Not on file    Attends religious service: Not on file    Active member of club or organization: Not on file    Attends meetings of clubs or organizations: Not on file    Relationship status: Not on file  . Intimate partner violence:    Fear of current or ex partner:  Not on file    Emotionally abused: Not on file    Physically abused: Not on file    Forced sexual activity: Not on file  Other Topics Concern  . Not on file  Social History Narrative   Married 50+ years, 2 kids   Runs [employer]   Likes to play golf     Review of Systems: General: negative for chills, fever, night sweats or weight changes.  Cardiovascular: negative for chest pain, dyspnea on exertion, edema, orthopnea, palpitations, paroxysmal nocturnal dyspnea or shortness of breath Dermatological: negative for rash Respiratory: negative for cough or wheezing Urologic: negative for hematuria Abdominal: negative for nausea, vomiting, diarrhea, bright red blood per rectum, melena, or hematemesis Neurologic: negative for visual changes, syncope, or dizziness All other systems reviewed and are otherwise negative except as noted above.    Blood pressure (!) 144/56, pulse 72,  height 5\' 6"  (1.676 m), weight 153 lb (69.4 kg).  General appearance: alert and no distress Neck: no adenopathy, no JVD, supple, symmetrical, trachea midline, thyroid not enlarged, symmetric, no tenderness/mass/nodules and Soft right carotid bruit Lungs: clear to auscultation bilaterally Heart: regular rate and rhythm, S1, S2 normal, no murmur, click, rub or gallop Extremities: extremities normal, atraumatic, no cyanosis or edema Pulses: 2+ and symmetric Skin: Skin color, texture, turgor normal. No rashes or lesions Neurologic: Alert and oriented X 3, normal strength and tone. Normal symmetric reflexes. Normal coordination and gait  EKG not performed today  ASSESSMENT AND PLAN:   PVD (peripheral vascular disease) (HCC) History of PAD status post unsuccessful left lower extremity endovascular revascularization with subsequent femoropopliteal bypass grafting by Dr. Lucky Cowboy.  He continues to have some claudication in his right calf.  HTN (hypertension) History of essential hypertension her blood pressure measured at 144/56.  He is on amlodipine and losartan.  Continue current meds at current dosing.  Hyperlipidemia History of hyperlipidemia on statin therapy.  Atypical chest pain History of atypical chest pain beginning several months ago occurring on a monthly basis lasting for minutes at a time not necessarily brought on by exertion.  We will get a routine GXT to further evaluate.      [name] MD FACP,FACC,FAHA, [facility] 04/09/2018 9:14 AM

## 2018-04-12 ENCOUNTER — Telehealth (HOSPITAL_COMMUNITY): Payer: Self-pay

## 2018-04-12 NOTE — Telephone Encounter (Signed)
Encounter complete. 

## 2018-04-16 ENCOUNTER — Telehealth (HOSPITAL_COMMUNITY): Payer: Self-pay

## 2018-04-16 NOTE — Telephone Encounter (Signed)
Encounter complete. 

## 2018-04-17 ENCOUNTER — Encounter (HOSPITAL_COMMUNITY): Payer: Medicare Other

## 2018-04-17 ENCOUNTER — Inpatient Hospital Stay (HOSPITAL_COMMUNITY): Admission: RE | Admit: 2018-04-17 | Payer: Medicare Other | Source: Ambulatory Visit

## 2018-04-17 ENCOUNTER — Ambulatory Visit (HOSPITAL_COMMUNITY)
Admission: RE | Admit: 2018-04-17 | Discharge: 2018-04-17 | Disposition: A | Discharge status: Home / Self Care | Payer: Medicare Other | Source: Ambulatory Visit | Attending: Cardiology | Admitting: Cardiology

## 2018-04-17 DIAGNOSIS — I200 Unstable angina: Secondary | ICD-10-CM | POA: Insufficient documentation

## 2018-04-18 LAB — EXERCISE TOLERANCE TEST
CHL CUP MPHR: 141 {beats}/min
CHL RATE OF PERCEIVED EXERTION: 18
CSEPEDS: 7 s
CSEPEW: 7 METS
CSEPHR: 92 %
CSEPPHR: 131 {beats}/min
Exercise duration (min): 5 min
Rest HR: 67 {beats}/min

## 2018-04-23 ENCOUNTER — Other Ambulatory Visit: Payer: Self-pay | Admitting: *Deleted

## 2018-04-23 DIAGNOSIS — R072 Precordial pain: Secondary | ICD-10-CM

## 2018-04-23 DIAGNOSIS — R9439 Abnormal result of other cardiovascular function study: Secondary | ICD-10-CM

## 2018-05-27 DIAGNOSIS — Z01812 Encounter for preprocedural laboratory examination: Secondary | ICD-10-CM

## 2018-05-28 DIAGNOSIS — Z01812 Encounter for preprocedural laboratory examination: Secondary | ICD-10-CM | POA: Diagnosis not present

## 2018-05-29 LAB — BASIC METABOLIC PANEL WITH GFR
BUN/Creatinine Ratio: 19 (ref 10–24)
BUN: 19 mg/dL (ref 8–27)
CO2: 23 mmol/L (ref 20–29)
Calcium: 9.7 mg/dL (ref 8.6–10.2)
Chloride: 99 mmol/L (ref 96–106)
Creatinine, Ser: 0.98 mg/dL (ref 0.76–1.27)
GFR calc Af Amer: 84 mL/min/{1.73_m2}
GFR calc non Af Amer: 73 mL/min/{1.73_m2}
Glucose: 138 mg/dL — ABNORMAL HIGH (ref 65–99)
Potassium: 4.3 mmol/L (ref 3.5–5.2)
Sodium: 140 mmol/L (ref 134–144)

## 2018-05-30 ENCOUNTER — Ambulatory Visit (HOSPITAL_COMMUNITY): Admission: RE | Admit: 2018-05-30 | Payer: Medicare Other | Source: Ambulatory Visit

## 2018-05-30 ENCOUNTER — Ambulatory Visit (HOSPITAL_COMMUNITY)
Admission: RE | Admit: 2018-05-30 | Discharge: 2018-05-30 | Disposition: A | Discharge status: Home / Self Care | Payer: Medicare Other | Source: Ambulatory Visit | Attending: Cardiovascular Disease | Admitting: Cardiovascular Disease

## 2018-05-30 ENCOUNTER — Encounter (HOSPITAL_COMMUNITY): Payer: Self-pay

## 2018-05-30 DIAGNOSIS — I700 Atherosclerosis of aorta: Secondary | ICD-10-CM | POA: Diagnosis not present

## 2018-05-30 DIAGNOSIS — R072 Precordial pain: Secondary | ICD-10-CM

## 2018-05-30 DIAGNOSIS — R9439 Abnormal result of other cardiovascular function study: Secondary | ICD-10-CM

## 2018-05-30 DIAGNOSIS — R9430 Abnormal result of cardiovascular function study, unspecified: Secondary | ICD-10-CM | POA: Diagnosis not present

## 2018-05-30 MED ORDER — METOPROLOL TARTRATE 5 MG/5ML IV SOLN
INTRAVENOUS | Status: AC
  Filled 2018-05-30: qty 5

## 2018-05-30 MED ORDER — NITROGLYCERIN 0.4 MG SL SUBL
SUBLINGUAL_TABLET | SUBLINGUAL | Status: AC
  Administered 2018-05-30: 0.8 mg
  Filled 2018-05-30: qty 2

## 2018-05-30 MED ORDER — NITROGLYCERIN 0.4 MG SL SUBL
0.8000 mg | SUBLINGUAL_TABLET | SUBLINGUAL | Status: DC | PRN
  Filled 2018-05-30: qty 25

## 2018-05-30 MED ORDER — METOPROLOL TARTRATE 5 MG/5ML IV SOLN
5.0000 mg | INTRAVENOUS | Status: DC | PRN
  Administered 2018-05-30: 5 mg via INTRAVENOUS
  Filled 2018-05-30 (×2): qty 5

## 2018-05-30 MED ORDER — IOPAMIDOL (ISOVUE-370) INJECTION 76%
100.0000 mL | Freq: Once | INTRAVENOUS | Status: AC | PRN
  Administered 2018-05-30: 80 mL via INTRAVENOUS

## 2018-06-06 NOTE — Telephone Encounter (Signed)
Spoke with the pt and he is anxious about his CT results and he has many little trips planned over the next few weeks to [facility] for [place].. I made him a return Ov with Dr. Gwenlyn Found for 06/28/18.. I advised him to hold off on golf until we speak with Dr. Gwenlyn Found. Pt verbalized understanding and agrees.

## 2018-06-10 DIAGNOSIS — R221 Localized swelling, mass and lump, neck: Secondary | ICD-10-CM | POA: Diagnosis not present

## 2018-06-10 DIAGNOSIS — C73 Malignant neoplasm of thyroid gland: Secondary | ICD-10-CM | POA: Diagnosis not present

## 2018-06-11 ENCOUNTER — Encounter: Payer: Self-pay | Admitting: Cardiovascular Disease

## 2018-06-11 ENCOUNTER — Ambulatory Visit (INDEPENDENT_AMBULATORY_CARE_PROVIDER_SITE_OTHER): Payer: Medicare Other | Admitting: Cardiovascular Disease

## 2018-06-11 DIAGNOSIS — R0789 Other chest pain: Secondary | ICD-10-CM

## 2018-06-11 DIAGNOSIS — I200 Unstable angina: Secondary | ICD-10-CM

## 2018-06-11 DIAGNOSIS — Z95820 Peripheral vascular angioplasty status with implants and grafts: Secondary | ICD-10-CM

## 2018-06-11 DIAGNOSIS — I739 Peripheral vascular disease, unspecified: Secondary | ICD-10-CM

## 2018-06-11 NOTE — Patient Instructions (Addendum)
Medication Instructions:  Your physician recommends that you continue on your current medications as directed. Please refer to the Current Medication list given to you today.  If you need a refill on your cardiac medications before your next appointment, please call your pharmacy.   Lab work: Your physician recommends that you return for lab work in: 06/13/2018  If you have labs (blood work) drawn today and your tests are completely normal, you will receive your results only by: [name] MyChart Message (if you have MyChart) OR . A paper copy in the mail If you have any lab test that is abnormal or we need to change your treatment, we will call you to review the results.  Testing/Procedures: Your physician has requested that you have a cardiac catheterization. Cardiac catheterization is used to diagnose and/or treat various heart conditions. Doctors may recommend this procedure for a number of different reasons. The most common reason is to evaluate chest pain. Chest pain can be a symptom of coronary artery disease (CAD), and cardiac catheterization can show whether plaque is narrowing or blocking your heart's arteries. This procedure is also used to evaluate the valves, as well as measure the blood flow and oxygen levels in different parts of your heart. For further information please visit HugeFiesta.tn. Please follow instruction sheet, as given.    Follow-Up: At [facility], you and your health needs are our priority.  As part of our continuing mission to provide you with exceptional heart care, we have created designated Provider Care Teams.  These Care Teams include your primary Cardiologist (physician) and Advanced Practice Providers (APPs -  Physician Assistants and Nurse Practitioners) who all work together to provide you with the care you need, when you need it. You will need a follow up appointment in 2-4 weeks from 06/24/2018.  Please call our office 2 months in advance to schedule  this appointment.  You may see Dr. Gwenlyn Found.  POST CATH FOLLOW-UP or one of the following Advanced Practice Providers on your designated Care Team:   Kerin Ransom, PA-C Roby Lofts, [place] . Sande Rives, PA-C  Any Other Special Instructions Will Be Listed Below (If Applicable).

## 2018-06-11 NOTE — Assessment & Plan Note (Signed)
History of atypical chest pain with recent positive GXT and coronary CTA revealing a coronary calcium score of 2800 with heavy calcification in all 3 coronary arteries.  On this, I recommended that we proceed with outpatient radial diagnostic coronary angiography to define his anatomy.  I have reviewed the risks, indications, and alternatives to cardiac catheterization, possible angioplasty, and stenting with the patient. Risks include but are not limited to bleeding, infection, vascular injury, stroke, myocardial infection, arrhythmia, kidney injury, radiation-related injury in the case of prolonged fluoroscopy use, emergency cardiac surgery, and death. The patient understands the risks of serious complication is 1-2 in 0263 with diagnostic cardiac cath and 1-2% or less with angioplasty/stenting.

## 2018-06-11 NOTE — Progress Notes (Signed)
06/11/2018 [name]   31-Mar-1941  [id number]  Primary Physician Tonia Ghent, MD Primary Cardiologist: [name] MD [name], [name]  HPI:  Ryan Pittman is a 77 y.o.  mildly overweight married Caucasian male father of 2, grandfather for grandchildren who is accompanied by his wife Ryan Pittman today.  He is referred by Dr. Elsie Stain for cardiovascular evaluation because of chest pain.    I last saw him in the office 04/09/2018.  His risk factors include 50 pack years of tobacco abuse having quit 8 years ago as well as treated hypertension and hyperlipidemia.  There is no family history.  He is never had a heart attack or stroke.  He does complain of some chest pain that began several months ago occurring on a monthly basis lasting minutes at a time.  He does have a history of PAD status post left femoropopliteal bypass grafting several years ago by Dr. Lucky Cowboy in [place].  I performed routine exercise treadmill testing on him 04/17/2018 which was significantly positive.  This led to a coronary CTA on 05/30/2018 revealing a coronary calcium score of 2858 with heavy calcification in all 3 coronary arteries.  Current Meds  Medication Sig  . amLODipine (NORVASC) 2.5 MG tablet Take 1 tablet (2.5 mg total) by mouth daily.  . clopidogrel (PLAVIX) 75 MG tablet Take by mouth.  . levothyroxine (SYNTHROID, LEVOTHROID) 100 MCG tablet Take 75 mcg by mouth daily before breakfast.  . losartan (COZAAR) 100 MG tablet Take 1 tablet (100 mg total) by mouth daily.  . Omega-3 Fatty Acids (FISH OIL OMEGA-3 PO) Take 1,400 Units by mouth daily.  . pravastatin (PRAVACHOL) 40 MG tablet Take 1 tablet (40 mg total) by mouth daily.  . traZODone (DESYREL) 100 MG tablet Take 100 mg by mouth as needed.      Allergies  Allergen Reactions  . Penicillins Itching  . Ace Inhibitors Cough  . Aspirin     H/o GI bleed  . Celebrex [Celecoxib]     GI bleed  . Lipitor [Atorvastatin]     myalgias     Social History   Socioeconomic History  . Marital status: Married    Spouse name: Not on file  . Number of children: Not on file  . Years of education: Not on file  . Highest education level: Not on file  Occupational History  . Not on file  Social Needs  . Financial resource strain: Not on file  . Food insecurity:    Worry: Not on file    Inability: Not on file  . Transportation needs:    Medical: Not on file    Non-medical: Not on file  Tobacco Use  . Smoking status: Former Smoker    Last attempt to quit: 09/04/2008    Years since quitting: 9.7  . Smokeless tobacco: Former Systems developer    Types: Chew  Substance and Sexual Activity  . Alcohol use: Yes    Alcohol/week: 0.0 standard drinks    Comment: Occasionally  . Drug use: No  . Sexual activity: Yes  Lifestyle  . Physical activity:    Days per week: Not on file    Minutes per session: Not on file  . Stress: Not on file  Relationships  . Social connections:    Talks on phone: Not on file    Gets together: Not on file    Attends religious service: Not on file    Active member of  club or organization: Not on file    Attends meetings of clubs or organizations: Not on file    Relationship status: Not on file  . Intimate partner violence:    Fear of current or ex partner: Not on file    Emotionally abused: Not on file    Physically abused: Not on file    Forced sexual activity: Not on file  Other Topics Concern  . Not on file  Social History Narrative   Married 50+ years, 2 kids   Runs [employer]   Likes to play golf     Review of Systems: General: negative for chills, fever, night sweats or weight changes.  Cardiovascular: negative for chest pain, dyspnea on exertion, edema, orthopnea, palpitations, paroxysmal nocturnal dyspnea or shortness of breath Dermatological: negative for rash Respiratory: negative for cough or wheezing Urologic: negative for hematuria Abdominal: negative for nausea, vomiting,  diarrhea, bright red blood per rectum, melena, or hematemesis Neurologic: negative for visual changes, syncope, or dizziness All other systems reviewed and are otherwise negative except as noted above.    Blood pressure (!) 147/81, pulse 87, height 5\' 7"  (1.702 m), weight 154 lb 6.4 oz (70 kg), SpO2 94 %.  General appearance: alert and no distress Neck: no adenopathy, no JVD, supple, symmetrical, trachea midline, thyroid not enlarged, symmetric, no tenderness/mass/nodules and Soft right carotid bruit Lungs: clear to auscultation bilaterally Heart: normal apical impulse Extremities: extremities normal, atraumatic, no cyanosis or edema Pulses: 2+ and symmetric Skin: Skin color, texture, turgor normal. No rashes or lesions Neurologic: Alert and oriented X 3, normal strength and tone. Normal symmetric reflexes. Normal coordination and gait  EKG not performed today  ASSESSMENT AND PLAN:   Atypical chest pain History of atypical chest pain with recent positive GXT and coronary CTA revealing a coronary calcium score of 2800 with heavy calcification in all 3 coronary arteries.  On this, I recommended that we proceed with outpatient radial diagnostic coronary angiography to define his anatomy.  I have reviewed the risks, indications, and alternatives to cardiac catheterization, possible angioplasty, and stenting with the patient. Risks include but are not limited to bleeding, infection, vascular injury, stroke, myocardial infection, arrhythmia, kidney injury, radiation-related injury in the case of prolonged fluoroscopy use, emergency cardiac surgery, and death. The patient understands the risks of serious complication is 1-2 in 3354 with diagnostic cardiac cath and 1-2% or less with angioplasty/stenting.       [name] MD FACP,FACC,FAHA, [facility] 06/11/2018 8:25 AM

## 2018-06-11 NOTE — H&P (View-Only) (Signed)
06/11/2018 [name]   1940-12-07  [id number]  Primary Physician Tonia Ghent, MD Primary Cardiologist: [name] MD [name], [name]  HPI:  Ryan Pittman is a 77 y.o.  mildly overweight married Caucasian male father of 2, grandfather for grandchildren who is accompanied by [name] today.  He is referred by Dr. Elsie Stain for cardiovascular evaluation because of chest pain.    I last saw him in the office 04/09/2018.  His risk factors include 50 pack years of tobacco abuse having quit 8 years ago as well as treated hypertension and hyperlipidemia.  There is no family history.  He is never had a heart attack or stroke.  He does complain of some chest pain that began several months ago occurring on a monthly basis lasting minutes at a time.  He does have a history of PAD status post left femoropopliteal bypass grafting several years ago by Dr. Lucky Cowboy in [place].  I performed routine exercise treadmill testing on him 04/17/2018 which was significantly positive.  This led to a coronary CTA on 05/30/2018 revealing a coronary calcium score of 2858 with heavy calcification in all 3 coronary arteries.  Current Meds  Medication Sig  . amLODipine (NORVASC) 2.5 MG tablet Take 1 tablet (2.5 mg total) by mouth daily.  . clopidogrel (PLAVIX) 75 MG tablet Take by mouth.  . levothyroxine (SYNTHROID, LEVOTHROID) 100 MCG tablet Take 75 mcg by mouth daily before breakfast.  . losartan (COZAAR) 100 MG tablet Take 1 tablet (100 mg total) by mouth daily.  . Omega-3 Fatty Acids (FISH OIL OMEGA-3 PO) Take 1,400 Units by mouth daily.  . pravastatin (PRAVACHOL) 40 MG tablet Take 1 tablet (40 mg total) by mouth daily.  . traZODone (DESYREL) 100 MG tablet Take 100 mg by mouth as needed.      Allergies  Allergen Reactions  . Penicillins Itching  . Ace Inhibitors Cough  . Aspirin     H/o GI bleed  . Celebrex [Celecoxib]     GI bleed  . Lipitor [Atorvastatin]     myalgias     Social History   Socioeconomic History  . Marital status: Married    Spouse name: Not on file  . Number of children: Not on file  . Years of education: Not on file  . Highest education level: Not on file  Occupational History  . Not on file  Social Needs  . Financial resource strain: Not on file  . Food insecurity:    Worry: Not on file    Inability: Not on file  . Transportation needs:    Medical: Not on file    Non-medical: Not on file  Tobacco Use  . Smoking status: Former Smoker    Last attempt to quit: 09/04/2008    Years since quitting: 9.7  . Smokeless tobacco: Former Systems developer    Types: Chew  Substance and Sexual Activity  . Alcohol use: Yes    Alcohol/week: 0.0 standard drinks    Comment: Occasionally  . Drug use: No  . Sexual activity: Yes  Lifestyle  . Physical activity:    Days per week: Not on file    Minutes per session: Not on file  . Stress: Not on file  Relationships  . Social connections:    Talks on phone: Not on file    Gets together: Not on file    Attends religious service: Not on file    Active member of  club or organization: Not on file    Attends meetings of clubs or organizations: Not on file    Relationship status: Not on file  . Intimate partner violence:    Fear of current or ex partner: Not on file    Emotionally abused: Not on file    Physically abused: Not on file    Forced sexual activity: Not on file  Other Topics Concern  . Not on file  Social History Narrative   Married 50+ years, 2 kids   Runs [employer]   Likes to play golf     Review of Systems: General: negative for chills, fever, night sweats or weight changes.  Cardiovascular: negative for chest pain, dyspnea on exertion, edema, orthopnea, palpitations, paroxysmal nocturnal dyspnea or shortness of breath Dermatological: negative for rash Respiratory: negative for cough or wheezing Urologic: negative for hematuria Abdominal: negative for nausea, vomiting,  diarrhea, bright red blood per rectum, melena, or hematemesis Neurologic: negative for visual changes, syncope, or dizziness All other systems reviewed and are otherwise negative except as noted above.    Blood pressure (!) 147/81, pulse 87, height 5\' 7"  (1.702 m), weight 154 lb 6.4 oz (70 kg), SpO2 94 %.  General appearance: alert and no distress Neck: no adenopathy, no JVD, supple, symmetrical, trachea midline, thyroid not enlarged, symmetric, no tenderness/mass/nodules and Soft right carotid bruit Lungs: clear to auscultation bilaterally Heart: normal apical impulse Extremities: extremities normal, atraumatic, no cyanosis or edema Pulses: 2+ and symmetric Skin: Skin color, texture, turgor normal. No rashes or lesions Neurologic: Alert and oriented X 3, normal strength and tone. Normal symmetric reflexes. Normal coordination and gait  EKG not performed today  ASSESSMENT AND PLAN:   Atypical chest pain History of atypical chest pain with recent positive GXT and coronary CTA revealing a coronary calcium score of 2800 with heavy calcification in all 3 coronary arteries.  On this, I recommended that we proceed with outpatient radial diagnostic coronary angiography to define his anatomy.  I have reviewed the risks, indications, and alternatives to cardiac catheterization, possible angioplasty, and stenting with the patient. Risks include but are not limited to bleeding, infection, vascular injury, stroke, myocardial infection, arrhythmia, kidney injury, radiation-related injury in the case of prolonged fluoroscopy use, emergency cardiac surgery, and death. The patient understands the risks of serious complication is 1-2 in 4801 with diagnostic cardiac cath and 1-2% or less with angioplasty/stenting.       [name] MD FACP,FACC,FAHA, [facility] 06/11/2018 8:25 AM

## 2018-06-11 NOTE — Addendum Note (Signed)
Addended by: Newt Minion on: 06/11/2018 10:06 AM   Modules accepted: Orders, SmartSet

## 2018-06-12 ENCOUNTER — Telehealth: Payer: Self-pay

## 2018-06-12 DIAGNOSIS — E039 Hypothyroidism, unspecified: Secondary | ICD-10-CM | POA: Diagnosis not present

## 2018-06-12 DIAGNOSIS — Z23 Encounter for immunization: Secondary | ICD-10-CM | POA: Diagnosis not present

## 2018-06-12 DIAGNOSIS — R0789 Other chest pain: Secondary | ICD-10-CM | POA: Diagnosis not present

## 2018-06-12 DIAGNOSIS — I701 Atherosclerosis of renal artery: Secondary | ICD-10-CM

## 2018-06-12 DIAGNOSIS — I150 Renovascular hypertension: Secondary | ICD-10-CM

## 2018-06-12 NOTE — Telephone Encounter (Addendum)
Pt in today to get labs for pre procedure stated wants to get dopplers done at [facility] and not with [facility]. Pt and wife aware to call [facility] to cancel current appts and new appts setup at [facility] for Nov. For dopplers

## 2018-06-13 LAB — CBC WITH DIFFERENTIAL/PLATELET
BASOS ABS: 0.1 10*3/uL (ref 0.0–0.2)
BASOS: 1 %
EOS (ABSOLUTE): 0.4 10*3/uL (ref 0.0–0.4)
Eos: 4 %
Hematocrit: 40.1 % (ref 37.5–51.0)
Hemoglobin: 13.9 g/dL (ref 13.0–17.7)
IMMATURE GRANS (ABS): 0.2 10*3/uL — AB (ref 0.0–0.1)
Immature Granulocytes: 2 %
LYMPHS: 18 %
Lymphocytes Absolute: 2 10*3/uL (ref 0.7–3.1)
MCH: 32.3 pg (ref 26.6–33.0)
MCHC: 34.7 g/dL (ref 31.5–35.7)
MCV: 93 fL (ref 79–97)
MONOS ABS: 1 10*3/uL — AB (ref 0.1–0.9)
Monocytes: 9 %
NEUTROS ABS: 7.5 10*3/uL — AB (ref 1.4–7.0)
NEUTROS PCT: 66 %
PLATELETS: 224 10*3/uL (ref 150–450)
RBC: 4.31 x10E6/uL (ref 4.14–5.80)
RDW: 15.1 % (ref 12.3–15.4)
WBC: 11.1 10*3/uL — ABNORMAL HIGH (ref 3.4–10.8)

## 2018-06-13 LAB — BASIC METABOLIC PANEL
BUN / CREAT RATIO: 14 (ref 10–24)
BUN: 14 mg/dL (ref 8–27)
CHLORIDE: 98 mmol/L (ref 96–106)
CO2: 23 mmol/L (ref 20–29)
Calcium: 10.2 mg/dL (ref 8.6–10.2)
Creatinine, Ser: 1.01 mg/dL (ref 0.76–1.27)
GFR calc non Af Amer: 70 mL/min/{1.73_m2} (ref >59)
GFR, EST AFRICAN AMERICAN: 81 mL/min/{1.73_m2} (ref >59)
GLUCOSE: 95 mg/dL (ref 65–99)
Potassium: 4.9 mmol/L (ref 3.5–5.2)
Sodium: 138 mmol/L (ref 134–144)

## 2018-06-13 LAB — TSH: TSH: 2.43 u[IU]/mL (ref 0.450–4.500)

## 2018-06-17 DIAGNOSIS — E890 Postprocedural hypothyroidism: Secondary | ICD-10-CM | POA: Diagnosis not present

## 2018-06-17 DIAGNOSIS — C73 Malignant neoplasm of thyroid gland: Secondary | ICD-10-CM | POA: Diagnosis not present

## 2018-06-24 ENCOUNTER — Ambulatory Visit (HOSPITAL_COMMUNITY)
Admission: RE | Admit: 2018-06-24 | Discharge: 2018-06-24 | Disposition: A | Discharge status: Home / Self Care | Payer: Medicare Other | Source: Ambulatory Visit | Attending: Cardiovascular Disease | Admitting: Cardiovascular Disease

## 2018-06-24 ENCOUNTER — Encounter (HOSPITAL_COMMUNITY)
Admission: RE | Disposition: A | Discharge status: Home / Self Care | Payer: Self-pay | Source: Ambulatory Visit | Attending: Cardiovascular Disease

## 2018-06-24 ENCOUNTER — Telehealth: Payer: Self-pay | Admitting: Family Medicine

## 2018-06-24 ENCOUNTER — Encounter (HOSPITAL_COMMUNITY): Payer: Self-pay | Admitting: Cardiovascular Disease

## 2018-06-24 ENCOUNTER — Other Ambulatory Visit: Payer: Self-pay

## 2018-06-24 DIAGNOSIS — I2584 Coronary atherosclerosis due to calcified coronary lesion: Secondary | ICD-10-CM | POA: Diagnosis not present

## 2018-06-24 DIAGNOSIS — I10 Essential (primary) hypertension: Secondary | ICD-10-CM | POA: Diagnosis not present

## 2018-06-24 DIAGNOSIS — I25119 Atherosclerotic heart disease of native coronary artery with unspecified angina pectoris: Secondary | ICD-10-CM | POA: Diagnosis not present

## 2018-06-24 DIAGNOSIS — Z7902 Long term (current) use of antithrombotics/antiplatelets: Secondary | ICD-10-CM | POA: Diagnosis not present

## 2018-06-24 DIAGNOSIS — Z87891 Personal history of nicotine dependence: Secondary | ICD-10-CM | POA: Insufficient documentation

## 2018-06-24 DIAGNOSIS — Z880 Allergy status to penicillin: Secondary | ICD-10-CM | POA: Insufficient documentation

## 2018-06-24 DIAGNOSIS — E663 Overweight: Secondary | ICD-10-CM | POA: Insufficient documentation

## 2018-06-24 DIAGNOSIS — E785 Hyperlipidemia, unspecified: Secondary | ICD-10-CM | POA: Insufficient documentation

## 2018-06-24 DIAGNOSIS — I209 Angina pectoris, unspecified: Secondary | ICD-10-CM | POA: Diagnosis present

## 2018-06-24 DIAGNOSIS — Z6823 Body mass index (BMI) 23.0-23.9, adult: Secondary | ICD-10-CM | POA: Insufficient documentation

## 2018-06-24 DIAGNOSIS — R0789 Other chest pain: Secondary | ICD-10-CM

## 2018-06-24 HISTORY — PX: LEFT HEART CATH AND CORONARY ANGIOGRAPHY: CATH118249

## 2018-06-24 HISTORY — PX: CARDIAC CATHETERIZATION: SHX172

## 2018-06-24 SURGERY — LEFT HEART CATH AND CORONARY ANGIOGRAPHY
Anesthesia: LOCAL

## 2018-06-24 MED ORDER — SODIUM CHLORIDE 0.9% FLUSH: 3.0000 mL | INTRAVENOUS | Status: DC | PRN

## 2018-06-24 MED ORDER — LIDOCAINE HCL (PF) 1 % IJ SOLN
INTRAMUSCULAR | Status: DC | PRN
  Administered 2018-06-24: 2 mL

## 2018-06-24 MED ORDER — MIDAZOLAM HCL 2 MG/2ML IJ SOLN
INTRAMUSCULAR | Status: DC | PRN
  Administered 2018-06-24: 1 mg via INTRAVENOUS

## 2018-06-24 MED ORDER — SODIUM CHLORIDE 0.9 % WEIGHT BASED INFUSION
3.0000 mL/kg/h | INTRAVENOUS | Status: AC
  Administered 2018-06-24: 3 mL/kg/h via INTRAVENOUS

## 2018-06-24 MED ORDER — SODIUM CHLORIDE 0.9% FLUSH: 3.0000 mL | Freq: Two times a day (BID) | INTRAVENOUS | Status: DC

## 2018-06-24 MED ORDER — HEPARIN (PORCINE) IN NACL 1000-0.9 UT/500ML-% IV SOLN
INTRAVENOUS | Status: AC
  Filled 2018-06-24: qty 1000

## 2018-06-24 MED ORDER — VERAPAMIL HCL 2.5 MG/ML IV SOLN
INTRA_ARTERIAL | Status: DC | PRN
  Administered 2018-06-24: 8 mL via INTRA_ARTERIAL

## 2018-06-24 MED ORDER — ONDANSETRON HCL 4 MG/2ML IJ SOLN: 4.0000 mg | Freq: Four times a day (QID) | INTRAMUSCULAR | Status: DC | PRN

## 2018-06-24 MED ORDER — NITROGLYCERIN 1 MG/10 ML FOR IR/CATH LAB
INTRA_ARTERIAL | Status: AC
  Filled 2018-06-24: qty 10

## 2018-06-24 MED ORDER — LIDOCAINE HCL (PF) 1 % IJ SOLN
INTRAMUSCULAR | Status: AC
  Filled 2018-06-24: qty 30

## 2018-06-24 MED ORDER — HEPARIN (PORCINE) IN NACL 1000-0.9 UT/500ML-% IV SOLN
INTRAVENOUS | Status: DC | PRN
  Administered 2018-06-24 (×2): 500 mL

## 2018-06-24 MED ORDER — ACETAMINOPHEN 325 MG PO TABS: 650.0000 mg | ORAL_TABLET | ORAL | Status: DC | PRN

## 2018-06-24 MED ORDER — FENTANYL CITRATE (PF) 100 MCG/2ML IJ SOLN
INTRAMUSCULAR | Status: DC | PRN
  Administered 2018-06-24: 25 ug via INTRAVENOUS

## 2018-06-24 MED ORDER — FENTANYL CITRATE (PF) 100 MCG/2ML IJ SOLN
INTRAMUSCULAR | Status: AC
  Filled 2018-06-24: qty 2

## 2018-06-24 MED ORDER — SODIUM CHLORIDE 0.9 % IV SOLN: 250.0000 mL | INTRAVENOUS | Status: DC | PRN

## 2018-06-24 MED ORDER — IOHEXOL 350 MG/ML SOLN
INTRAVENOUS | Status: DC | PRN
  Administered 2018-06-24: 85 mL via INTRACARDIAC

## 2018-06-24 MED ORDER — MIDAZOLAM HCL 2 MG/2ML IJ SOLN
INTRAMUSCULAR | Status: AC
  Filled 2018-06-24: qty 2

## 2018-06-24 MED ORDER — SODIUM CHLORIDE 0.9 % WEIGHT BASED INFUSION: 1.0000 mL/kg/h | INTRAVENOUS | Status: DC

## 2018-06-24 MED ORDER — VERAPAMIL HCL 2.5 MG/ML IV SOLN
INTRAVENOUS | Status: AC
  Filled 2018-06-24: qty 2

## 2018-06-24 MED ORDER — MORPHINE SULFATE (PF) 10 MG/ML IV SOLN: 2.0000 mg | INTRAVENOUS | Status: DC | PRN

## 2018-06-24 MED ORDER — SODIUM CHLORIDE 0.9 % IV SOLN: INTRAVENOUS | Status: AC

## 2018-06-24 MED ORDER — HEPARIN SODIUM (PORCINE) 1000 UNIT/ML IJ SOLN
INTRAMUSCULAR | Status: DC | PRN
  Administered 2018-06-24: 3500 [IU] via INTRAVENOUS

## 2018-06-24 MED ORDER — HYDROXYZINE HCL 10 MG PO TABS: 5.0000 mg | ORAL_TABLET | Freq: Three times a day (TID) | ORAL | 0 refills | Status: DC | PRN

## 2018-06-24 SURGICAL SUPPLY — 12 items
CATH INFINITI 5FR ANG PIGTAIL (CATHETERS) ×1 IMPLANT
CATH OPTITORQUE TIG 4.0 5F (CATHETERS) ×1 IMPLANT
DEVICE RAD COMP TR BAND LRG (VASCULAR PRODUCTS) ×1 IMPLANT
GLIDESHEATH SLEND A-KIT 6F 22G (SHEATH) ×1 IMPLANT
GUIDEWIRE INQWIRE 1.5J.035X260 (WIRE) IMPLANT
INQWIRE 1.5J .035X260CM (WIRE) ×2
KIT HEART LEFT (KITS) ×2 IMPLANT
PACK CARDIAC CATHETERIZATION (CUSTOM PROCEDURE TRAY) ×2 IMPLANT
SYR MEDRAD MARK V 150ML (SYRINGE) ×2 IMPLANT
TRANSDUCER W/STOPCOCK (MISCELLANEOUS) ×2 IMPLANT
TUBING CIL FLEX 10 FLL-RA (TUBING) ×2 IMPLANT
WIRE HI TORQ VERSACORE-J 145CM (WIRE) ×1 IMPLANT

## 2018-06-24 NOTE — Discharge Instructions (Signed)

## 2018-06-24 NOTE — Telephone Encounter (Signed)
Pt's wife came in office and stated pt is having open heart surgery next week and pt need some medication for his nerves to get him over until his his surgery.  Pt use [employer]

## 2018-06-24 NOTE — Progress Notes (Signed)
Aspirin not given allergy noted. Pt states he was told never to take even a baby aspirin.

## 2018-06-24 NOTE — Telephone Encounter (Signed)
Try taking hydroxyzine in the meantime.  Sedation caution.  Update me as needed.  I wish him the best.  I am glad he has seen cardiology.

## 2018-06-24 NOTE — Interval H&P Note (Signed)
Cath Lab Visit (complete for each Cath Lab visit)  Clinical Evaluation Leading to the Procedure:   ACS: No.  Non-ACS:    Anginal Classification: CCS II  Anti-ischemic medical therapy: Minimal Therapy (1 class of medications)  Non-Invasive Test Results: Intermediate-risk stress test findings: cardiac mortality 1-3%/year  Prior CABG: No previous CABG      History and Physical Interval Note:  06/24/2018 10:37 AM  Ryan Pittman  has presented today for surgery, with the diagnosis of abnormal gxt  The various methods of treatment have been discussed with the patient and family. After consideration of risks, benefits and other options for treatment, the patient has consented to  Procedure(s): LEFT HEART CATH AND CORONARY ANGIOGRAPHY (N/A) as a surgical intervention .  The patient's history has been reviewed, patient examined, no change in status, stable for surgery.  I have reviewed the patient's chart and labs.  Questions were answered to the patient's satisfaction.     Ryan Pittman

## 2018-06-24 NOTE — Telephone Encounter (Signed)
Patient advised.

## 2018-06-28 ENCOUNTER — Ambulatory Visit: Payer: Medicare Other | Admitting: Cardiovascular Disease

## 2018-07-03 ENCOUNTER — Encounter: Payer: Self-pay | Admitting: Surgery

## 2018-07-03 ENCOUNTER — Other Ambulatory Visit: Payer: Self-pay | Admitting: *Deleted

## 2018-07-03 ENCOUNTER — Institutional Professional Consult (permissible substitution) (INDEPENDENT_AMBULATORY_CARE_PROVIDER_SITE_OTHER): Payer: Medicare Other | Admitting: Surgery

## 2018-07-03 ENCOUNTER — Other Ambulatory Visit: Payer: Self-pay

## 2018-07-03 ENCOUNTER — Encounter: Payer: Self-pay | Admitting: *Deleted

## 2018-07-03 VITALS — BP 148/70 | HR 65 | Resp 16 | Ht 67.0 in | Wt 155.0 lb

## 2018-07-03 DIAGNOSIS — I200 Unstable angina: Secondary | ICD-10-CM

## 2018-07-03 DIAGNOSIS — I2510 Atherosclerotic heart disease of native coronary artery without angina pectoris: Secondary | ICD-10-CM | POA: Diagnosis not present

## 2018-07-03 NOTE — Progress Notes (Signed)
Cardiothoracic Surgery Consultation  PCP is Tonia Ghent, MD Referring Provider is [name], MD  Chief Complaint  Patient presents with  . Coronary Artery Disease    eval for CABG...CATH 10/21, COR CTA 9/26, XERCISE TOLERANCE 8/14, CAROTIDS 6/12    HPI:  The patient is a 77 year old gentleman with a history of hypertension, hyperlipidemia, peripheral vascular disease status post left femoral-popliteal bypass, carotid artery disease, renal artery stenosis with chronic kidney disease, anemia due to GI bleeding, and IgG gammopathy followed at [facility].  He is followed by Dr. Elsie Stain and recently reported having some intermittent chest pain over the previous several months that lasted for only a few minutes at a time.  This was not associated with any particular activity.  He is also noted a significant decrease in his exertional tolerance.  His wife, son, and daughter with him today and said that he frequently comes home during the day and takes a nap which she had never done before.  He was referred to Dr. Alvester Chou and underwent an exercise treadmill test on 04/17/2018 which was markedly positive showing significant 2 mm ST depression in leads V4 and V5 with 1 mm of ST depression in the inferior leads concerning for ischemia.  These changes resolved quickly in recovery.  He underwent a gated cardiac CT with calcium scoring on 05/30/2018 which showed a coronary calcium score of 2858 placing him in the 90th percentile for his age and gender.  There is heavy calcification in all 3 major coronary arteries.  He subsequently underwent cardiac catheterization on 06/24/2018.  This showed a 90% ostial left circumflex stenosis.  There is 80% stenosis at the ostium of the second marginal branch.  The LAD had 60 to 70% proximal to mid vessel stenosis.  The right coronary artery had 80% mid vessel stenosis over a long area.  Left ventricular ejection fraction was 55 to 65%.  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  He is a previous smoker with a 50-pack-year history but quit about 8 years ago.  He has no family history of heart disease. Past Medical History:  Diagnosis Date  . Anemia    due to GIB, s/p transfusion  . Arthritis    back pain, much worse after consecutive golf rounds  . Chronic kidney disease   . Colon polyps   . Hypercalcemia    h/o, resolved as of 2012, prev due to high amount of calcium intake  . Hyperlipidemia   . Hypertension   . IgG gammopathy    stable as of 2012 per [facility]  . PVD (peripheral vascular disease) ([place])    L leg bypass, R leg stented    Past Surgical History:  Procedure Laterality Date  . BYPASS GRAFT     L leg  . COLONOSCOPY WITH PROPOFOL N/A 05/04/2015   Procedure: COLONOSCOPY WITH PROPOFOL;  Surgeon: Lollie Sails, MD;  Location: [facility] ENDOSCOPY;  Service: Endoscopy;  Laterality: N/A;  . LEFT HEART CATH AND CORONARY ANGIOGRAPHY N/A 06/24/2018   Procedure: LEFT HEART CATH AND CORONARY ANGIOGRAPHY;  Surgeon: [name], MD;  Location: [place] CV LAB;  Service: Cardiovascular;  Laterality: N/A;  . THYROIDECTOMY, PARTIAL  2016    Family History  Problem Relation Age of Onset  . Diabetes Mother   . Stroke Father   . Colon cancer Neg Hx   . Prostate cancer Neg Hx     Social History Social History  Tobacco Use  . Smoking status: Former Smoker    Types: Cigarettes    Last attempt to quit: 09/04/2008    Years since quitting: 9.8  . Smokeless tobacco: Former Systems developer    Types: Chew  Substance Use Topics  . Alcohol use: Yes    Alcohol/week: 0.0 standard drinks    Comment: Occasionally  . Drug use: No    Current Outpatient Medications  Medication Sig Dispense Refill  . amLODipine (NORVASC) 2.5 MG tablet Take 1 tablet (2.5 mg total) by mouth daily. 90 tablet 3  . Artificial Tear Ointment (DRY EYES OP) Place 1-2 drops into both eyes daily as needed (for dry eyes).    . Ascorbic Acid (VITAMIN C  PO) Take 1 tablet by mouth daily.    . hydrOXYzine (ATARAX/VISTARIL) 10 MG tablet Take 0.5-1 tablets (5-10 mg total) by mouth 3 (three) times daily as needed for anxiety. 30 tablet 0  . levothyroxine (SYNTHROID, LEVOTHROID) 75 MCG tablet Take 75 mcg by mouth daily before breakfast.     . losartan (COZAAR) 100 MG tablet Take 1 tablet (100 mg total) by mouth daily. 90 tablet 3  . Omega-3 Fatty Acids (FISH OIL OMEGA-3 PO) Take 1 capsule by mouth daily.     . pravastatin (PRAVACHOL) 40 MG tablet Take 1 tablet (40 mg total) by mouth daily. 90 tablet 3  . traZODone (DESYREL) 100 MG tablet Take 50 mg by mouth at bedtime as needed for sleep.     . clopidogrel (PLAVIX) 75 MG tablet Take 75 mg by mouth daily.      No current facility-administered medications for this visit.     Allergies  Allergen Reactions  . Penicillins Itching and Other (See Comments)    Has patient had a PCN reaction causing immediate rash, facial/tongue/throat swelling, SOB or lightheadedness with hypotension: Yes Has patient had a PCN reaction causing severe rash involving mucus membranes or skin necrosis: No Has patient had a PCN reaction that required hospitalization: No Has patient had a PCN reaction occurring within the last 10 years: No If all of the above answers are "NO", then may proceed with Cephalosporin use.   . Ace Inhibitors Cough  . Aspirin Other (See Comments)    H/o GI bleed  . Celebrex [Celecoxib] Other (See Comments)    GI bleed  . Lipitor [Atorvastatin] Other (See Comments)    myalgias    Review of Systems  Constitutional: Positive for activity change and fatigue.  HENT: Negative.   Eyes: Negative.   Respiratory: Positive for shortness of breath.   Cardiovascular: Positive for chest pain. Negative for leg swelling.  Gastrointestinal: Negative.   Endocrine: Negative.   Genitourinary: Negative.   Musculoskeletal: Negative.   Allergic/Immunologic: Negative.   Neurological: Negative.     Hematological: Negative.   Psychiatric/Behavioral: Negative.     BP (!) 148/70 (BP Location: Right Arm, Patient Position: Sitting, Cuff Size: Large)   Pulse 65   Resp 16   Ht 5\' 7"  (1.702 m)   Wt 155 lb (70.3 kg)   SpO2 99% Comment: ON RA  BMI 24.28 kg/m  Physical Exam  Constitutional: He is oriented to person, place, and time. He appears well-developed and well-nourished. No distress.  HENT:  Head: Normocephalic and atraumatic.  Mouth/Throat: Oropharynx is clear and moist.  Eyes: Pupils are equal, round, and reactive to light. Conjunctivae and EOM are normal.  Neck: Normal range of motion. Neck supple. No JVD present. No thyromegaly present.  Cardiovascular: Normal rate,  regular rhythm and normal heart sounds.  No murmur heard. Pulmonary/Chest: Effort normal and breath sounds normal. No respiratory distress.  Abdominal: Soft. Bowel sounds are normal. He exhibits no distension. There is no tenderness.  Musculoskeletal: Normal range of motion. He exhibits no edema.  Multiple left leg incisions over the course of the greater saphenous vein from his prior femoropopliteal bypass.  Lymphadenopathy:    He has no cervical adenopathy.  Neurological: He is alert and oriented to person, place, and time.  Skin: Skin is warm and dry.  Psychiatric: He has a normal mood and affect.     Diagnostic Tests:  Physicians   Panel Physicians Referring Physician Case Authorizing Physician  [name], MD (Primary)    Procedures   LEFT HEART CATH AND CORONARY ANGIOGRAPHY  Conclusion     Ost Cx to Prox Cx lesion is 90% stenosed.  Prox LAD to Mid LAD lesion is 60% stenosed.  Ost 2nd Mrg lesion is 80% stenosed.  Prox RCA to Mid RCA lesion is 80% stenosed.  The left ventricular systolic function is normal.  LV end diastolic pressure is normal.  The left ventricular ejection fraction is 55-65% by visual estimate.   TADAO EMIG is a 77 y.o. male     [id number] LOCATION:  FACILITY: [facility]  PHYSICIAN: [name], M.D. 05-26-1941   DATE OF PROCEDURE:  06/24/2018  DATE OF DISCHARGE:     CARDIAC CATHETERIZATION     History obtained from chart review.Yonathan L Beltonis a 61 [employer] overweight married Caucasian male father of 2, grandfather for grandchildren who is accompanied by [name] today. He is referred by Dr. Elsie Stain for cardiovascular evaluation because of chest pain.I last saw him in the office 04/09/2018. His risk factors include 50 pack years of tobacco abuse having quit 8 years ago as well as treated hypertension and hyperlipidemia. There is no family history. He is never had a heart attack or stroke. He does complain of some chest pain that began several months ago occurring on a monthly basis lasting minutes at a time. He does have a history of PAD status post left femoropopliteal bypass grafting several years ago by Dr. Sherron Flemings.  I performed routine exercise treadmill testing on him 04/17/2018 which was significantly positive. This led to a coronary CTA on 05/30/2018 revealing a coronary calcium score of 2858 with heavy calcification in all 3 coronary arteries.   IMPRESSION: Mr. Markus has a coronary calcium score of over 2000, severe highly calcified three-vessel CAD with a positive GXT, chest pain and moderate segmental disease in the proximal to mid LAD, ostial circumflex and mid dominant RCA with normal LV function.  I believe the best therapy would be coronary artery bypass grafting.  The sheath was removed and a TR band was placed on the right wrist to achieve patent hemostasis.  The patient left the lab in stable condition.  He will be discharged as an outpatient today and will be seen by TCT S as an outpatient for consideration of bypass grafting.  [name]. MD, [facility] 06/24/2018 11:16 AM        Indications   Angina pectoris ([place]) [I20.9 (ICD-10-CM)]   Procedural Details/Technique   Technical Details PROCEDURE DESCRIPTION:   The patient was brought to the second floor [facility] cath lab in the postabsorptive state. He was premedicated with IV Versed and fentanyl. His right wristwas prepped and shaved in usual sterile fashion. Xylocaine 1% was used  for local  anesthesia. A 6 French sheath was inserted into the right radial artery using standard Seldinger technique. The patient received 3500 units of paren intravenously. A 5 [place] TIG catheter and pigtail catheter were used for selective coronary angiography and left ventriculography respectively. Isovue dye was used for the entirety of the case. Retrograde aorta, left ventricular and pullback pressures were recorded. Radial cocktail was administered via the SideArm sheath.   Estimated blood loss <50 mL.  During this procedure the patient was administered the following to achieve and maintain moderate conscious sedation: Versed 1 mg, Fentanyl 25 mcg, while the patient's heart rate, blood pressure, and oxygen saturation were continuously monitored. The period of conscious sedation was 17 minutes, of which I was present face-to-face 100% of this time.  Coronary Findings   Diagnostic  Dominance: Right  Left Anterior Descending  Prox LAD to Mid LAD lesion 60% stenosed  Prox LAD to Mid LAD lesion is 60% stenosed.  Left Circumflex  Ost Cx to Prox Cx lesion 90% stenosed  Ost Cx to Prox Cx lesion is 90% stenosed.  Second Obtuse Marginal Branch  Ost 2nd Mrg lesion 80% stenosed  Ost 2nd Mrg lesion is 80% stenosed.  Right Coronary Artery  Prox RCA to Mid RCA lesion 80% stenosed  Prox RCA to Mid RCA lesion is 80% stenosed. The lesion is calcified.  Intervention   No interventions have been documented.  Wall Motion   Resting       All segments of the heart are normal.          Left Heart   Left Ventricle The left ventricular size is normal. The left ventricular systolic  function is normal. LV end diastolic pressure is normal. The left ventricular ejection fraction is 55-65% by visual estimate. No regional wall motion abnormalities.  Coronary Diagrams   Diagnostic Diagram       Implants    No implant documentation for this case.  MERGE Images   Show images for CARDIAC CATHETERIZATION   Link to Procedure Log   Procedure Log    Hemo Data    Most Recent Value  AO Systolic Pressure 433 mmHg  AO Diastolic Pressure 46 mmHg  AO Mean 67 mmHg  LV Systolic Pressure 295 mmHg  LV Diastolic Pressure 4 mmHg  LV EDP 8 mmHg  AOp Systolic Pressure 188 mmHg  AOp Diastolic Pressure 54 mmHg  AOp Mean Pressure 88 mmHg  LVp Systolic Pressure 416 mmHg  LVp Diastolic Pressure 0 mmHg  LVp EDP Pressure 9 mmHg    Impression:  This 77 year old gentleman has severe three-vessel coronary disease with intermittent episodes of chest discomfort that are not associated with any particular activity as well as exertional fatigue and shortness of breath.  He had a markedly abnormal exercise tolerance test.  I agree that coronary bypass graft surgery is the best treatment to prevent further ischemia and infarction and improve his quality of life.  I reviewed the cardiac catheterization images with the patient and his family and answered all their questions.  He has had a previous left femoropopliteal bypass with reverse saphenous vein and we will need to scan the saphenous vein in his right leg to be sure that it is present and patent. I discussed the operative procedure with the patient and family including alternatives, benefits and risks; including but not limited to bleeding, blood transfusion, infection, stroke, myocardial infarction, graft failure, heart block requiring a permanent pacemaker, organ dysfunction, and death.  [name] understands and  agrees to proceed.    Plan:  Coronary bypass graft surgery on Thursday, 07/11/2018.   I spent 60 minutes performing  this consultation and > 50% of this time was spent face to face counseling and coordinating the care of this patient's severe multivessel coronary disease.  Gaye Pollack, MD [facility] [phone]

## 2018-07-08 ENCOUNTER — Ambulatory Visit: Payer: Medicare Other | Admitting: Cardiology

## 2018-07-09 NOTE — Pre-Procedure Instructions (Signed)
Ryan Pittman  07/09/2018     Your procedure is scheduled on Thursday, November 7.  Report to [facility] Admitting at 5:30 AM                   Your surgery or procedure is scheduled for 7:30 A.M.   Call this number if you have problems the morning of surgery: [phone]  This is the number for the Pre- Surgical Desk.    Remember:  Do not eat or drink after midnight.   Take these medicines the morning of surgery with A SIP OF WATER: amLODipine (NORVASC) levothyroxine (SYNTHROID, LEVOTHROID)   pravastatin (PRAVACHOL)   May take hydrOXYzine (ATARAX/VISTARIL)  if needed.  clopidogrel (PLAVIX)  As instructed by Dr Cyndia Bent        STOP taking Aspirin Products (Goody Powder, Excedrin Migraine), Ibuprofen (Advil), Naproxen (Aleve), Vitamins and Herbal Products (ie Fish Oil)   [place]- Preparing For Surgery  Before surgery, you can play an important role. Because skin is not sterile, your skin needs to be as free of germs as possible. You can reduce the number of germs on your skin by washing with CHG (chlorahexidine gluconate) Soap before surgery.  CHG is an antiseptic cleaner which kills germs and bonds with the skin to continue killing germs even after washing.    Oral Hygiene is also important to reduce your risk of infection.  Remember - BRUSH YOUR TEETH THE MORNING OF SURGERY WITH YOUR REGULAR TOOTHPASTE  Please do not use if you have an allergy to CHG or antibacterial soaps. If your skin becomes reddened/irritated stop using the CHG.  Do not shave (including legs and underarms) for at least 48 hours prior to first CHG shower. It is OK to shave your face.  Please follow these instructions carefully.   1. Shower the NIGHT BEFORE SURGERY and the MORNING OF SURGERY with CHG.   2. If you chose to wash your hair, wash your hair first as usual with your normal shampoo.  3. After you shampoo,Wash your face and private area with the soap you use at home, then rinse  your hair and body thoroughly to remove the shampoo and soap. 4.  5. Use CHG as you would any other liquid soap. You can apply CHG directly to the skin and wash gently with a scrungie or a clean washcloth.   Apply the CHG Soap to your body ONLY FROM THE NECK DOWN.  Do not use on open wounds or open sores. Avoid contact with your eyes, ears, mouth and genitals (private parts). 6. Wash thoroughly, paying special attention to the area where your surgery will be performed.  7. Thoroughly rinse your body with warm water from the neck down.  8. DO NOT shower/wash with your normal soap after using and rinsing off the CHG Soap.  9. Pat yourself dry with a CLEAN TOWEL.  10. Wear CLEAN PAJAMAS to bed the night before surgery, wear comfortable clothes the morning of surgery  11. Place CLEAN SHEETS on your bed the night of your first shower and DO NOT SLEEP WITH PETS.   Day of Surgery: Shower as above  Do not apply any deodorants/lotions, powders or colognes.  Please wear clean clothes to the hospital/surgery center.   Remember to brush your teeth WITH YOUR REGULAR TOOTHPASTE.  Do not wear jewelry, make-up or nail polish..  Do not shave 48 hours prior to surgery.  Men may shave face and neck.  Do not bring valuables to the hospital.  [facility] is not responsible for any belongings or valuables.  Contacts, dentures or bridgework may not be worn into surgery.  Leave your suitcase in the car.  After surgery it may be brought to your room.  For patients admitted to the hospital, discharge time will be determined by your treatment team.  Please read over the following fact sheets that you were given:Pain Booklet, Patient Instructions for Mupirocin Application, Incentive Spirometry, Surgical Site Infections.

## 2018-07-10 ENCOUNTER — Encounter (HOSPITAL_COMMUNITY): Payer: Self-pay

## 2018-07-10 ENCOUNTER — Encounter (HOSPITAL_COMMUNITY)
Admission: RE | Admit: 2018-07-10 | Discharge: 2018-07-10 | Disposition: A | Discharge status: Home / Self Care | Payer: Medicare Other | Source: Ambulatory Visit | Attending: Surgery | Admitting: Surgery

## 2018-07-10 ENCOUNTER — Ambulatory Visit (HOSPITAL_COMMUNITY): Payer: Medicare Other

## 2018-07-10 ENCOUNTER — Ambulatory Visit (HOSPITAL_BASED_OUTPATIENT_CLINIC_OR_DEPARTMENT_OTHER)
Admission: RE | Admit: 2018-07-10 | Discharge: 2018-07-10 | Disposition: A | Discharge status: Home / Self Care | Payer: Medicare Other | Source: Ambulatory Visit | Attending: Surgery | Admitting: Surgery

## 2018-07-10 ENCOUNTER — Ambulatory Visit (HOSPITAL_COMMUNITY)
Admission: RE | Admit: 2018-07-10 | Discharge: 2018-07-10 | Disposition: A | Discharge status: Home / Self Care | Payer: Medicare Other | Source: Ambulatory Visit | Attending: Surgery | Admitting: Surgery

## 2018-07-10 DIAGNOSIS — I2510 Atherosclerotic heart disease of native coronary artery without angina pectoris: Secondary | ICD-10-CM

## 2018-07-10 DIAGNOSIS — Z01818 Encounter for other preprocedural examination: Secondary | ICD-10-CM | POA: Diagnosis not present

## 2018-07-10 DIAGNOSIS — I209 Angina pectoris, unspecified: Secondary | ICD-10-CM

## 2018-07-10 HISTORY — DX: Gastro-esophageal reflux disease without esophagitis: K21.9

## 2018-07-10 HISTORY — DX: Angina pectoris, unspecified: I20.9

## 2018-07-10 HISTORY — DX: Malignant (primary) neoplasm, unspecified: C80.1

## 2018-07-10 HISTORY — DX: Atherosclerotic heart disease of native coronary artery without angina pectoris: I25.10

## 2018-07-10 HISTORY — DX: Dyspnea, unspecified: R06.00

## 2018-07-10 HISTORY — DX: Pneumonia, unspecified organism: J18.9

## 2018-07-10 LAB — PULMONARY FUNCTION TEST
DL/VA % pred: 77 %
DL/VA: 3.41 ml/min/mmHg/L
DLCO COR: 19.52 ml/min/mmHg
DLCO cor % pred: 68 %
DLCO unc % pred: 64 %
DLCO unc: 18.25 ml/min/mmHg
FEF 25-75 Post: 1.82 L/sec
FEF 25-75 Pre: 1.6 L/sec
FEF2575-%CHANGE-POST: 14 %
FEF2575-%PRED-PRE: 91 %
FEF2575-%Pred-Post: 104 %
FEV1-%Change-Post: 3 %
FEV1-%PRED-PRE: 85 %
FEV1-%Pred-Post: 88 %
FEV1-Post: 2.25 L
FEV1-Pre: 2.17 L
FEV1FVC-%CHANGE-POST: 2 %
FEV1FVC-%Pred-Pre: 102 %
FEV6-%Change-Post: 3 %
FEV6-%PRED-PRE: 87 %
FEV6-%Pred-Post: 89 %
FEV6-PRE: 2.89 L
FEV6-Post: 2.98 L
FEV6FVC-%Change-Post: 1 %
FEV6FVC-%PRED-PRE: 105 %
FEV6FVC-%Pred-Post: 106 %
FVC-%Change-Post: 1 %
FVC-%PRED-POST: 83 %
FVC-%PRED-PRE: 82 %
FVC-POST: 3 L
FVC-PRE: 2.96 L
POST FEV6/FVC RATIO: 99 %
Post FEV1/FVC ratio: 75 %
Pre FEV1/FVC ratio: 73 %
Pre FEV6/FVC Ratio: 98 %
RV % PRED: 149 %
RV: 3.7 L
TLC % pred: 105 %
TLC: 6.8 L

## 2018-07-10 LAB — URINALYSIS, ROUTINE W REFLEX MICROSCOPIC
Bilirubin Urine: NEGATIVE
Glucose, UA: NEGATIVE mg/dL
Hgb urine dipstick: NEGATIVE
Ketones, ur: NEGATIVE mg/dL
LEUKOCYTES UA: NEGATIVE
Nitrite: NEGATIVE
PROTEIN: NEGATIVE mg/dL
Specific Gravity, Urine: 1.008 (ref 1.005–1.030)
pH: 7 (ref 5.0–8.0)

## 2018-07-10 LAB — COMPREHENSIVE METABOLIC PANEL
ALT: 20 U/L (ref 0–44)
AST: 19 U/L (ref 15–41)
Albumin: 3.5 g/dL (ref 3.5–5.0)
Alkaline Phosphatase: 35 U/L — ABNORMAL LOW (ref 38–126)
Anion gap: 10 (ref 5–15)
BUN: 26 mg/dL — AB (ref 8–23)
CO2: 18 mmol/L — ABNORMAL LOW (ref 22–32)
CREATININE: 1.27 mg/dL — AB (ref 0.61–1.24)
Calcium: 8.8 mg/dL — ABNORMAL LOW (ref 8.9–10.3)
Chloride: 108 mmol/L (ref 98–111)
GFR calc Af Amer: >60 mL/min (ref >60)
GFR, EST NON AFRICAN AMERICAN: 52 mL/min — AB (ref >60)
Glucose, Bld: 97 mg/dL (ref 70–99)
POTASSIUM: 4.2 mmol/L (ref 3.5–5.1)
Sodium: 136 mmol/L (ref 135–145)
TOTAL PROTEIN: 6.4 g/dL — AB (ref 6.5–8.1)
Total Bilirubin: 0.6 mg/dL (ref 0.3–1.2)

## 2018-07-10 LAB — CBC
HCT: 38 % — ABNORMAL LOW (ref 39.0–52.0)
Hemoglobin: 12.5 g/dL — ABNORMAL LOW (ref 13.0–17.0)
MCH: 31.1 pg (ref 26.0–34.0)
MCHC: 32.9 g/dL (ref 30.0–36.0)
MCV: 94.5 fL (ref 80.0–100.0)
NRBC: 0 % (ref 0.0–0.2)
Platelets: 233 10*3/uL (ref 150–400)
RBC: 4.02 MIL/uL — ABNORMAL LOW (ref 4.22–5.81)
RDW: 14.1 % (ref 11.5–15.5)
WBC: 13.2 10*3/uL — ABNORMAL HIGH (ref 4.0–10.5)

## 2018-07-10 LAB — HEMOGLOBIN A1C
HEMOGLOBIN A1C: 5.7 % — AB (ref 4.8–5.6)
Mean Plasma Glucose: 116.89 mg/dL

## 2018-07-10 LAB — BLOOD GAS, ARTERIAL
ACID-BASE DEFICIT: 2 mmol/L (ref 0.0–2.0)
BICARBONATE: 22 mmol/L (ref 20.0–28.0)
Drawn by: 470591
O2 Saturation: 98 %
PATIENT TEMPERATURE: 98.6
PO2 ART: 110 mmHg — AB (ref 83.0–108.0)
pCO2 arterial: 35.7 mmHg (ref 32.0–48.0)
pH, Arterial: 7.407 (ref 7.350–7.450)

## 2018-07-10 LAB — SURGICAL PCR SCREEN
MRSA, PCR: NEGATIVE
STAPHYLOCOCCUS AUREUS: NEGATIVE

## 2018-07-10 LAB — APTT: aPTT: 34 seconds (ref 24–36)

## 2018-07-10 LAB — PROTIME-INR
INR: 0.95
Prothrombin Time: 12.6 seconds (ref 11.4–15.2)

## 2018-07-10 MED ORDER — ALBUTEROL SULFATE (2.5 MG/3ML) 0.083% IN NEBU
2.5000 mg | INHALATION_SOLUTION | Freq: Once | RESPIRATORY_TRACT | Status: AC
  Administered 2018-07-10: 2.5 mg via RESPIRATORY_TRACT

## 2018-07-10 MED ORDER — PHENYLEPHRINE HCL-NACL 20-0.9 MG/250ML-% IV SOLN
30.0000 ug/min | INTRAVENOUS | Status: AC
  Administered 2018-07-11: 20 ug/min via INTRAVENOUS
  Filled 2018-07-10: qty 250

## 2018-07-10 MED ORDER — LEVOFLOXACIN IN D5W 500 MG/100ML IV SOLN
500.0000 mg | INTRAVENOUS | Status: AC
  Administered 2018-07-11: 500 mg via INTRAVENOUS
  Filled 2018-07-10: qty 100

## 2018-07-10 MED ORDER — EPINEPHRINE PF 1 MG/ML IJ SOLN
0.0000 ug/min | INTRAVENOUS | Status: DC
  Filled 2018-07-10: qty 4

## 2018-07-10 MED ORDER — MILRINONE LACTATE IN DEXTROSE 20-5 MG/100ML-% IV SOLN
0.3000 ug/kg/min | INTRAVENOUS | Status: DC
  Filled 2018-07-10: qty 100

## 2018-07-10 MED ORDER — TRANEXAMIC ACID (OHS) BOLUS VIA INFUSION
15.0000 mg/kg | INTRAVENOUS | Status: AC
  Administered 2018-07-11: 1063.5 mg via INTRAVENOUS
  Filled 2018-07-10: qty 1064

## 2018-07-10 MED ORDER — POTASSIUM CHLORIDE 2 MEQ/ML IV SOLN
80.0000 meq | INTRAVENOUS | Status: DC
  Filled 2018-07-10: qty 40

## 2018-07-10 MED ORDER — MAGNESIUM SULFATE 50 % IJ SOLN
40.0000 meq | INTRAMUSCULAR | Status: DC
  Filled 2018-07-10: qty 9.85

## 2018-07-10 MED ORDER — PLASMA-LYTE 148 IV SOLN
INTRAVENOUS | Status: AC
  Administered 2018-07-11: 500 mL
  Filled 2018-07-10: qty 2.5

## 2018-07-10 MED ORDER — DOPAMINE-DEXTROSE 3.2-5 MG/ML-% IV SOLN
0.0000 ug/kg/min | INTRAVENOUS | Status: DC
  Filled 2018-07-10: qty 250

## 2018-07-10 MED ORDER — NITROGLYCERIN IN D5W 200-5 MCG/ML-% IV SOLN
2.0000 ug/min | INTRAVENOUS | Status: AC
  Administered 2018-07-11: 16.6 ug/min via INTRAVENOUS
  Filled 2018-07-10: qty 250

## 2018-07-10 MED ORDER — INSULIN REGULAR(HUMAN) IN NACL 100-0.9 UT/100ML-% IV SOLN
INTRAVENOUS | Status: AC
  Administered 2018-07-11: .8 [IU]/h via INTRAVENOUS
  Filled 2018-07-10: qty 100

## 2018-07-10 MED ORDER — VANCOMYCIN HCL 10 G IV SOLR
1250.0000 mg | INTRAVENOUS | Status: AC
  Administered 2018-07-11: 1250 mg via INTRAVENOUS
  Filled 2018-07-10: qty 1250

## 2018-07-10 MED ORDER — TRANEXAMIC ACID 1000 MG/10ML IV SOLN
1.5000 mg/kg/h | INTRAVENOUS | Status: AC
  Administered 2018-07-11: 1.5 mg/kg/h via INTRAVENOUS
  Filled 2018-07-10: qty 25

## 2018-07-10 MED ORDER — NOREPINEPHRINE 4 MG/250ML-% IV SOLN
0.0000 ug/min | INTRAVENOUS | Status: DC
  Filled 2018-07-10: qty 250

## 2018-07-10 MED ORDER — TRANEXAMIC ACID (OHS) PUMP PRIME SOLUTION
2.0000 mg/kg | INTRAVENOUS | Status: DC
  Filled 2018-07-10: qty 1.42

## 2018-07-10 MED ORDER — SODIUM CHLORIDE 0.9 % IV SOLN
INTRAVENOUS | Status: DC
  Filled 2018-07-10: qty 30

## 2018-07-10 MED ORDER — DEXMEDETOMIDINE HCL IN NACL 400 MCG/100ML IV SOLN
0.1000 ug/kg/h | INTRAVENOUS | Status: AC
  Administered 2018-07-11: 0.7 ug/kg/h via INTRAVENOUS
  Filled 2018-07-10: qty 100

## 2018-07-10 NOTE — Progress Notes (Signed)
PCP: Dr. Elsie Stain @ [facility] Cardiologist: Dr. Gwenlyn Found

## 2018-07-10 NOTE — H&P (Signed)
[address]       [place] [address]             [phone]      Cardiothoracic Surgery Admission History and Physical   PCP is Tonia Ghent, MD  Referring Provider is [name], MD      Chief Complaint  Patient presents with  . Coronary Artery Disease       HPI:  The patient is a 77 year old gentleman with a history of hypertension, hyperlipidemia, peripheral vascular disease status post left femoral-popliteal bypass, carotid artery disease, renal artery stenosis with chronic kidney disease, anemia due to GI bleeding, and IgG gammopathy followed at [facility]. He is followed by Dr. Elsie Stain and recently reported having some intermittent chest pain over the previous several months that lasted for only a few minutes at a time. This was not associated with any particular activity. He is also noted a significant decrease in his exertional tolerance. His wife, son, and daughter with him today and said that he frequently comes home during the day and takes a nap which she had never done before. He was referred to Dr. Alvester Chou and underwent an exercise treadmill test on 04/17/2018 which was markedly positive showing significant 2 mm ST depression in leads V4 and V5 with 1 mm of ST depression in the inferior leads concerning for ischemia. These changes resolved quickly in recovery. He underwent a gated cardiac CT with calcium scoring on 05/30/2018 which showed a coronary calcium score of 2858 placing him in the 90th percentile for his age and gender. There is heavy calcification in all 3 major coronary arteries. He subsequently underwent cardiac catheterization on 06/24/2018. This showed a 90% ostial left circumflex stenosis. There is 80% stenosis at the ostium of the second marginal branch. The LAD had 60 to 70% proximal to mid vessel stenosis. The right coronary artery had 80% mid vessel stenosis over a long area. Left ventricular ejection fraction was 55 to 65%.  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 He is a previous smoker with a 50-pack-year history but quit about 8 years ago. He has no family history of heart disease.       Past Medical History:  Diagnosis Date  . Anemia    due to GIB, s/p transfusion  . Arthritis    back pain, much worse after consecutive golf rounds  . Chronic kidney disease   . Colon polyps   . Hypercalcemia    h/o, resolved as of 2012, prev due to high amount of calcium intake  . Hyperlipidemia   . Hypertension   . IgG gammopathy    stable as of 2012 per [facility]  . PVD (peripheral vascular disease) ([place])    L leg bypass, R leg stented        Past Surgical History:  Procedure Laterality Date  . BYPASS GRAFT     L leg  . COLONOSCOPY WITH PROPOFOL N/A 05/04/2015   Procedure: COLONOSCOPY WITH PROPOFOL; Surgeon: Lollie Sails, MD; Location: [facility] ENDOSCOPY; Service: Endoscopy; Laterality: N/A;  . LEFT HEART CATH AND CORONARY ANGIOGRAPHY N/A 06/24/2018   Procedure: LEFT HEART CATH AND CORONARY ANGIOGRAPHY; Surgeon: [name], MD; Location: [facility]; Service: Cardiovascular; Laterality: N/A;  . THYROIDECTOMY, PARTIAL  2016        Family History  Problem Relation Age of Onset  . Diabetes Mother   . Stroke Father   .  Colon cancer Neg Hx   . Prostate cancer Neg Hx    Social History  Social History        Tobacco Use  . Smoking status: Former Smoker    Types: Cigarettes    Last attempt to quit: 09/04/2008    Years since quitting: 9.8  . Smokeless tobacco: Former Systems developer    Types: Chew  Substance Use Topics  . Alcohol use: Yes    Alcohol/week: 0.0 standard drinks    Comment: Occasionally  . Drug use: No         Current Outpatient Medications  Medication Sig Dispense Refill  . amLODipine (NORVASC) 2.5 MG tablet Take 1 tablet (2.5 mg total) by mouth daily. 90 tablet 3  . Artificial Tear Ointment (DRY EYES OP) Place 1-2 drops into both eyes daily as needed (for dry  eyes).    . Ascorbic Acid (VITAMIN C PO) Take 1 tablet by mouth daily.    . hydrOXYzine (ATARAX/VISTARIL) 10 MG tablet Take 0.5-1 tablets (5-10 mg total) by mouth 3 (three) times daily as needed for anxiety. 30 tablet 0  . levothyroxine (SYNTHROID, LEVOTHROID) 75 MCG tablet Take 75 mcg by mouth daily before breakfast.     . losartan (COZAAR) 100 MG tablet Take 1 tablet (100 mg total) by mouth daily. 90 tablet 3  . Omega-3 Fatty Acids (FISH OIL OMEGA-3 PO) Take 1 capsule by mouth daily.     . pravastatin (PRAVACHOL) 40 MG tablet Take 1 tablet (40 mg total) by mouth daily. 90 tablet 3  . traZODone (DESYREL) 100 MG tablet Take 50 mg by mouth at bedtime as needed for sleep.     . clopidogrel (PLAVIX) 75 MG tablet Take 75 mg by mouth daily.      No current facility-administered medications for this visit.         Allergies  Allergen Reactions  . Penicillins Itching and Other (See Comments)    Has patient had a PCN reaction causing immediate rash, facial/tongue/throat swelling, SOB or lightheadedness with hypotension: Yes  Has patient had a PCN reaction causing severe rash involving mucus membranes or skin necrosis: No  Has patient had a PCN reaction that required hospitalization: No  Has patient had a PCN reaction occurring within the last 10 years: No  If all of the above answers are "NO", then may proceed with Cephalosporin use.   . Ace Inhibitors Cough  . Aspirin Other (See Comments)    H/o GI bleed  . Celebrex [Celecoxib] Other (See Comments)    GI bleed  . Lipitor [Atorvastatin] Other (See Comments)    myalgias   Review of Systems   Constitutional: Positive for activity change and fatigue.  HENT: Negative.  Eyes: Negative.  Respiratory: Positive for shortness of breath.  Cardiovascular: Positive for chest pain. Negative for leg swelling.  Gastrointestinal: Negative.  Endocrine: Negative.  Genitourinary: Negative.  Musculoskeletal: Negative.  Allergic/Immunologic: Negative.    Neurological: Negative.  Hematological: Negative.  Psychiatric/Behavioral: Negative.    BP (!) 148/70 (BP Location: Right Arm, Patient Position: Sitting, Cuff Size: Large)  Pulse 65  Resp 16  Ht 5\' 7"  (1.702 m)  Wt 155 lb (70.3 kg)  SpO2 99% Comment: ON RA  BMI 24.28 kg/m  Physical Exam  Constitutional: He is oriented to person, place, and time. He appears well-developed and well-nourished. No distress.  HENT:  Head: Normocephalic and atraumatic.  Mouth/Throat: Oropharynx is clear and moist.  Eyes: Pupils are equal, round, and reactive to light.  Conjunctivae and EOM are normal.  Neck: Normal range of motion. Neck supple. No JVD present. No thyromegaly present.  Cardiovascular: Normal rate, regular rhythm and normal heart sounds.  No murmur heard.  Pulmonary/Chest: Effort normal and breath sounds normal. No respiratory distress.  Abdominal: Soft. Bowel sounds are normal. He exhibits no distension. There is no tenderness.  Musculoskeletal: Normal range of motion. He exhibits no edema.  Multiple left leg incisions over the course of the greater saphenous vein from his prior femoropopliteal bypass.  Lymphadenopathy:  He has no cervical adenopathy.  Neurological: He is alert and oriented to person, place, and time.  Skin: Skin is warm and dry.  Psychiatric: He has a normal mood and affect.   Diagnostic Tests:  Physicians  Panel Physicians Referring Physician Case Authorizing Physician  [name], MD (Primary)    Procedures  LEFT HEART CATH AND CORONARY ANGIOGRAPHY  Conclusion  Ost Cx to Prox Cx lesion is 90% stenosed.  Prox LAD to Mid LAD lesion is 60% stenosed.  Ost 2nd Mrg lesion is 80% stenosed.  Prox RCA to Mid RCA lesion is 80% stenosed.  The left ventricular systolic function is normal.  LV end diastolic pressure is normal.  The left ventricular ejection fraction is 55-65% by visual estimate. DEEJAY KOPPELMAN is a 77 y.o. male  [id number]  LOCATION: FACILITY:  [facility]  PHYSICIAN: [name], M.D.  09-24-1940  DATE OF PROCEDURE: 06/24/2018  DATE OF DISCHARGE:   CARDIAC CATHETERIZATION  History obtained from chart review.Ryan Pittman is a 77 y.o. mildly overweight married Caucasian male father of 2, grandfather for grandchildren who is accompanied by [name] today. He is referred by Dr. Elsie Stain for cardiovascular evaluation because of chest pain. I last saw him in the office 04/09/2018. His risk factors include 50 pack years of tobacco abuse having quit 8 years ago as well as treated hypertension and hyperlipidemia. There is no family history. He is never had a heart attack or stroke. He does complain of some chest pain that began several months ago occurring on a monthly basis lasting minutes at a time. He does have a history of PAD status post left femoropopliteal bypass grafting several years ago by Dr. Lucky Cowboy in [place].  I performed routine exercise treadmill testing on him 04/17/2018 which was significantly positive. This led to a coronary CTA on 05/30/2018 revealing a coronary calcium score of 2858 with heavy calcification in all 3 coronary arteries.  IMPRESSION: Mr. Vu has a coronary calcium score of over 2000, severe highly calcified three-vessel CAD with a positive GXT, chest pain and moderate segmental disease in the proximal to mid LAD, ostial circumflex and mid dominant RCA with normal LV function. I believe the best therapy would be coronary artery bypass grafting. The sheath was removed and a TR band was placed on the right wrist to achieve patent hemostasis. The patient left the lab in stable condition. He will be discharged as an outpatient today and will be seen by TCT S as an outpatient for consideration of bypass grafting.  [name]. MD, [facility]  06/24/2018  11:16 AM   Indications  Angina pectoris ([place]) [I20.9 (ICD-10-CM)]  Procedural Details/Technique  Technical Details PROCEDURE DESCRIPTION:   The patient was  brought to the second floor [facility] cath lab in the postabsorptive state. He was premedicated with IV Versed and fentanyl. His right wristwas prepped and shaved in usual sterile fashion. Xylocaine 1% was used  for local anesthesia.  A 6 French sheath was inserted into the right radial artery using standard Seldinger technique. The patient received 3500 units of paren intravenously. A 5 [place] TIG catheter and pigtail catheter were used for selective coronary angiography and left ventriculography respectively. Isovue dye was used for the entirety of the case. Retrograde aorta, left ventricular and pullback pressures were recorded. Radial cocktail was administered via the SideArm sheath.   Estimated blood loss <50 mL.  During this procedure the patient was administered the following to achieve and maintain moderate conscious sedation: Versed 1 mg, Fentanyl 25 mcg, while the patient's heart rate, blood pressure, and oxygen saturation were continuously monitored. The period of conscious sedation was 17 minutes, of which I was present face-to-face 100% of this time.  Coronary Findings  Diagnostic  Dominance: Right  Left Anterior Descending  Prox LAD to Mid LAD lesion 60% stenosed  Prox LAD to Mid LAD lesion is 60% stenosed.  Left Circumflex  Ost Cx to Prox Cx lesion 90% stenosed  Ost Cx to Prox Cx lesion is 90% stenosed.  Second Obtuse Marginal Branch  Ost 2nd Mrg lesion 80% stenosed  Ost 2nd Mrg lesion is 80% stenosed.  Right Coronary Artery  Prox RCA to Mid RCA lesion 80% stenosed  Prox RCA to Mid RCA lesion is 80% stenosed. The lesion is calcified.  Intervention  No interventions have been documented.  Wall Motion        Resting         All segments of the heart are normal.        Left Heart  Left Ventricle The left ventricular size is normal. The left ventricular systolic function is normal. LV end diastolic pressure is normal. The left ventricular ejection fraction is 55-65%  by visual estimate. No regional wall motion abnormalities.  Coronary Diagrams  Diagnostic Diagram     Implants     No implant documentation for this case.  MERGE Images  Link to Procedure Log   Show images for CARDIAC CATHETERIZATION Procedure Log  Hemo Data   Most Recent Value  AO Systolic Pressure 443 mmHg  AO Diastolic Pressure 46 mmHg  AO Mean 67 mmHg  LV Systolic Pressure 154 mmHg  LV Diastolic Pressure 4 mmHg  LV EDP 8 mmHg  AOp Systolic Pressure 008 mmHg  AOp Diastolic Pressure 54 mmHg  AOp Mean Pressure 88 mmHg  LVp Systolic Pressure 676 mmHg  LVp Diastolic Pressure 0 mmHg  LVp EDP Pressure 9 mmHg   Impression:   This 77 year old gentleman has severe three-vessel coronary disease with intermittent episodes of chest discomfort that are not associated with any particular activity as well as exertional fatigue and shortness of breath. He had a markedly abnormal exercise tolerance test. I agree that coronary bypass graft surgery is the best treatment to prevent further ischemia and infarction and improve his quality of life. I reviewed the cardiac catheterization images with the patient and his family and answered all their questions. He has had a previous left femoropopliteal bypass with reverse saphenous vein and we will need to scan the saphenous vein in his right leg to be sure that it is present and patent. I discussed the operative procedure with the patient and family including alternatives, benefits and risks; including but not limited to bleeding, blood transfusion, infection, stroke, myocardial infarction, graft failure, heart block requiring a permanent pacemaker, organ dysfunction, and death. [name] understands and agrees to proceed.   Plan:   Coronary bypass graft surgery  Gaye Pollack, MD  [facility]  [phone]

## 2018-07-10 NOTE — Progress Notes (Addendum)
Pre cabg evaluation  Carotid:Carotid duplex 02/13/18 no significant stenosis.    Right ABI: Resting right ankle-brachial index indicates moderate right lower extremity arterial disease.  Left ABI: Resting left ankle-brachial index indicates mild left lower extremity arterial disease.    Bilateral Upper Extremity: Doppler waveforms remain within normal limits with compression bilaterally for the radial arteries. Doppler waveforms remain within normal limits with compression bilaterally for the ulnar arteries.     Landry Mellow, RDMS, RVT

## 2018-07-10 NOTE — Progress Notes (Signed)
LE vein mapping     Landry Mellow, RDMS, RVT

## 2018-07-10 NOTE — Anesthesia Preprocedure Evaluation (Addendum)
Anesthesia Evaluation  Patient identified by MRN, date of birth, ID band Patient awake    Reviewed: Allergy & Precautions, NPO status , Patient's Chart, lab work & pertinent test results  History of Anesthesia Complications Negative for: history of anesthetic complications  Airway Mallampati: III  TM Distance: >3 FB Neck ROM: Full    Dental  (+) Dental Advisory Given, Teeth Intact   Pulmonary former smoker,    breath sounds clear to auscultation       Cardiovascular hypertension, Pt. on medications + angina with exertion + CAD and + Peripheral Vascular Disease   Rhythm:Regular Rate:Normal   '19 Cath - Ost Cx to Prox Cx lesion is 90% stenosed. Prox LAD to Mid LAD lesion is 60% stenosed. Ost 2nd Mrg lesion is 80% stenosed. Prox RCA to Mid RCA lesion is 80% stenosed. The left ventricular systolic function is normal. LV end diastolic pressure is normal. The LVEF is 55-65% by visual estimate.  '19 ETT - Blood pressure demonstrated a hypertensive response to exercise. ST segment depression of 2 mm was noted during stress in the V4 and V5 leads, and returning to baseline after less than 1 minute of recovery.  1. Hypertensive BP response.  2. There was significant 2 mm ST depression in leads V4 and V5, 1 mm ST depression in inferior leads.  This is concerning for ischemia but resolved quickly in recovery.   '19 Carotid US - Mild bilateral carotid atherosclerosis. No hemodynamically significant ICA stenosis. Degree of narrowing less than 50% bilaterally by ultrasound criteria.   Neuro/Psych negative neurological ROS  negative psych ROS   GI/Hepatic Neg liver ROS, PUD, GERD  Controlled,  Endo/Other  Hypothyroidism   Renal/GU CRFRenal disease Renal artery stenosis     BPH     Musculoskeletal  (+) Arthritis ,   Abdominal   Peds  Hematology  (+) anemia ,  IgG gammopathy    Anesthesia Other Findings   Reproductive/Obstetrics                            Anesthesia Physical Anesthesia Plan  ASA: IV  Anesthesia Plan: General   Post-op Pain Management:    Induction: Intravenous  PONV Risk Score and Plan: 3 and Treatment may vary due to age or medical condition, Ondansetron and Midazolam  Airway Management Planned: Oral ETT  Additional Equipment: Arterial line, CVP, PA Cath, TEE and Ultrasound Guidance Line Placement  Intra-op Plan:   Post-operative Plan: Post-operative intubation/ventilation  Informed Consent: I have reviewed the patients History and Physical, chart, labs and discussed the procedure including the risks, benefits and alternatives for the proposed anesthesia with the patient or authorized representative who has indicated his/her understanding and acceptance.   Dental advisory given  Plan Discussed with: CRNA and Anesthesiologist  Anesthesia Plan Comments:        Anesthesia Quick Evaluation

## 2018-07-11 ENCOUNTER — Other Ambulatory Visit: Payer: Self-pay

## 2018-07-11 ENCOUNTER — Inpatient Hospital Stay (HOSPITAL_COMMUNITY): Payer: Medicare Other | Admitting: Certified Registered Nurse Anesthetist

## 2018-07-11 ENCOUNTER — Inpatient Hospital Stay (HOSPITAL_COMMUNITY): Payer: Medicare Other

## 2018-07-11 ENCOUNTER — Inpatient Hospital Stay (HOSPITAL_COMMUNITY)
Admission: RE | Disposition: A | Discharge status: Home / Self Care | Payer: Self-pay | Source: Home / Self Care | Attending: Surgery

## 2018-07-11 ENCOUNTER — Encounter (HOSPITAL_COMMUNITY): Payer: Self-pay

## 2018-07-11 ENCOUNTER — Inpatient Hospital Stay (HOSPITAL_COMMUNITY)
Admission: RE | Admit: 2018-07-11 | Discharge: 2018-07-18 | DRG: 236 | Disposition: A | Discharge status: Home / Self Care | Payer: Medicare Other | Attending: Surgery | Admitting: Surgery

## 2018-07-11 DIAGNOSIS — Z951 Presence of aortocoronary bypass graft: Secondary | ICD-10-CM | POA: Diagnosis not present

## 2018-07-11 DIAGNOSIS — D72829 Elevated white blood cell count, unspecified: Secondary | ICD-10-CM | POA: Diagnosis present

## 2018-07-11 DIAGNOSIS — Z888 Allergy status to other drugs, medicaments and biological substances status: Secondary | ICD-10-CM

## 2018-07-11 DIAGNOSIS — K567 Ileus, unspecified: Secondary | ICD-10-CM | POA: Diagnosis not present

## 2018-07-11 DIAGNOSIS — E890 Postprocedural hypothyroidism: Secondary | ICD-10-CM | POA: Diagnosis present

## 2018-07-11 DIAGNOSIS — J9811 Atelectasis: Secondary | ICD-10-CM | POA: Diagnosis not present

## 2018-07-11 DIAGNOSIS — Z4682 Encounter for fitting and adjustment of non-vascular catheter: Secondary | ICD-10-CM | POA: Diagnosis not present

## 2018-07-11 DIAGNOSIS — F419 Anxiety disorder, unspecified: Secondary | ICD-10-CM | POA: Diagnosis present

## 2018-07-11 DIAGNOSIS — R140 Abdominal distension (gaseous): Secondary | ICD-10-CM | POA: Diagnosis not present

## 2018-07-11 DIAGNOSIS — D62 Acute posthemorrhagic anemia: Secondary | ICD-10-CM | POA: Diagnosis not present

## 2018-07-11 DIAGNOSIS — I701 Atherosclerosis of renal artery: Secondary | ICD-10-CM | POA: Diagnosis present

## 2018-07-11 DIAGNOSIS — I4891 Unspecified atrial fibrillation: Secondary | ICD-10-CM | POA: Diagnosis not present

## 2018-07-11 DIAGNOSIS — K219 Gastro-esophageal reflux disease without esophagitis: Secondary | ICD-10-CM | POA: Diagnosis present

## 2018-07-11 DIAGNOSIS — E785 Hyperlipidemia, unspecified: Secondary | ICD-10-CM | POA: Diagnosis present

## 2018-07-11 DIAGNOSIS — I25110 Atherosclerotic heart disease of native coronary artery with unstable angina pectoris: Secondary | ICD-10-CM | POA: Diagnosis not present

## 2018-07-11 DIAGNOSIS — Z95828 Presence of other vascular implants and grafts: Secondary | ICD-10-CM | POA: Diagnosis not present

## 2018-07-11 DIAGNOSIS — I081 Rheumatic disorders of both mitral and tricuspid valves: Secondary | ICD-10-CM | POA: Diagnosis not present

## 2018-07-11 DIAGNOSIS — D472 Monoclonal gammopathy: Secondary | ICD-10-CM | POA: Diagnosis present

## 2018-07-11 DIAGNOSIS — Z880 Allergy status to penicillin: Secondary | ICD-10-CM | POA: Diagnosis not present

## 2018-07-11 DIAGNOSIS — N183 Chronic kidney disease, stage 3 (moderate): Secondary | ICD-10-CM | POA: Diagnosis present

## 2018-07-11 DIAGNOSIS — Z886 Allergy status to analgesic agent status: Secondary | ICD-10-CM | POA: Diagnosis not present

## 2018-07-11 DIAGNOSIS — Z79899 Other long term (current) drug therapy: Secondary | ICD-10-CM | POA: Diagnosis not present

## 2018-07-11 DIAGNOSIS — I739 Peripheral vascular disease, unspecified: Secondary | ICD-10-CM | POA: Diagnosis present

## 2018-07-11 DIAGNOSIS — J90 Pleural effusion, not elsewhere classified: Secondary | ICD-10-CM | POA: Diagnosis not present

## 2018-07-11 DIAGNOSIS — Z79890 Hormone replacement therapy (postmenopausal): Secondary | ICD-10-CM | POA: Diagnosis not present

## 2018-07-11 DIAGNOSIS — I10 Essential (primary) hypertension: Secondary | ICD-10-CM | POA: Diagnosis not present

## 2018-07-11 DIAGNOSIS — Z7902 Long term (current) use of antithrombotics/antiplatelets: Secondary | ICD-10-CM | POA: Diagnosis not present

## 2018-07-11 DIAGNOSIS — E8770 Fluid overload, unspecified: Secondary | ICD-10-CM | POA: Diagnosis not present

## 2018-07-11 DIAGNOSIS — I97190 Other postprocedural cardiac functional disturbances following cardiac surgery: Secondary | ICD-10-CM | POA: Diagnosis not present

## 2018-07-11 DIAGNOSIS — I25119 Atherosclerotic heart disease of native coronary artery with unspecified angina pectoris: Principal | ICD-10-CM | POA: Diagnosis present

## 2018-07-11 DIAGNOSIS — I129 Hypertensive chronic kidney disease with stage 1 through stage 4 chronic kidney disease, or unspecified chronic kidney disease: Secondary | ICD-10-CM | POA: Diagnosis present

## 2018-07-11 DIAGNOSIS — Z87891 Personal history of nicotine dependence: Secondary | ICD-10-CM

## 2018-07-11 DIAGNOSIS — D150 Benign neoplasm of thymus: Secondary | ICD-10-CM | POA: Diagnosis not present

## 2018-07-11 DIAGNOSIS — I2510 Atherosclerotic heart disease of native coronary artery without angina pectoris: Secondary | ICD-10-CM

## 2018-07-11 DIAGNOSIS — D649 Anemia, unspecified: Secondary | ICD-10-CM | POA: Diagnosis not present

## 2018-07-11 DIAGNOSIS — R918 Other nonspecific abnormal finding of lung field: Secondary | ICD-10-CM | POA: Diagnosis not present

## 2018-07-11 DIAGNOSIS — E039 Hypothyroidism, unspecified: Secondary | ICD-10-CM | POA: Diagnosis not present

## 2018-07-11 HISTORY — PX: TEE WITHOUT CARDIOVERSION: SHX5443

## 2018-07-11 HISTORY — PX: CORONARY ARTERY BYPASS GRAFT: SHX141

## 2018-07-11 LAB — CBC
HEMATOCRIT: 27.5 % — AB (ref 39.0–52.0)
HEMOGLOBIN: 8.8 g/dL — AB (ref 13.0–17.0)
MCH: 30.7 pg (ref 26.0–34.0)
MCHC: 32 g/dL (ref 30.0–36.0)
MCV: 95.8 fL (ref 80.0–100.0)
Platelets: 155 10*3/uL (ref 150–400)
RBC: 2.87 MIL/uL — ABNORMAL LOW (ref 4.22–5.81)
RDW: 14.2 % (ref 11.5–15.5)
WBC: 17.8 10*3/uL — AB (ref 4.0–10.5)
nRBC: 0 % (ref 0.0–0.2)

## 2018-07-11 LAB — CBC WITH DIFFERENTIAL/PLATELET
ABS IMMATURE GRANULOCYTES: 0.39 10*3/uL — AB (ref 0.00–0.07)
BASOS ABS: 0.1 10*3/uL (ref 0.0–0.1)
Basophils Relative: 0 %
EOS ABS: 0.1 10*3/uL (ref 0.0–0.5)
Eosinophils Relative: 0 %
HCT: 27.8 % — ABNORMAL LOW (ref 39.0–52.0)
HEMOGLOBIN: 8.8 g/dL — AB (ref 13.0–17.0)
IMMATURE GRANULOCYTES: 2 %
LYMPHS ABS: 0.9 10*3/uL (ref 0.7–4.0)
LYMPHS PCT: 5 %
MCH: 30.4 pg (ref 26.0–34.0)
MCHC: 31.7 g/dL (ref 30.0–36.0)
MCV: 96.2 fL (ref 80.0–100.0)
Monocytes Absolute: 1 10*3/uL (ref 0.1–1.0)
Monocytes Relative: 5 %
NEUTROS ABS: 15.7 10*3/uL — AB (ref 1.7–7.7)
NEUTROS PCT: 88 %
NRBC: 0 % (ref 0.0–0.2)
Platelets: 146 10*3/uL — ABNORMAL LOW (ref 150–400)
RBC: 2.89 MIL/uL — ABNORMAL LOW (ref 4.22–5.81)
RDW: 14.2 % (ref 11.5–15.5)
WBC: 18.1 10*3/uL — ABNORMAL HIGH (ref 4.0–10.5)

## 2018-07-11 LAB — ABO/RH: ABO/RH(D): A NEG

## 2018-07-11 LAB — POCT I-STAT, CHEM 8
BUN: 23 mg/dL (ref 8–23)
BUN: 19 mg/dL (ref 8–23)
BUN: 21 mg/dL (ref 8–23)
BUN: 21 mg/dL (ref 8–23)
BUN: 21 mg/dL (ref 8–23)
CALCIUM ION: 1.29 mmol/L (ref 1.15–1.40)
CALCIUM ION: 1.26 mmol/L (ref 1.15–1.40)
CALCIUM ION: 1.12 mmol/L — AB (ref 1.15–1.40)
CHLORIDE: 107 mmol/L (ref 98–111)
CREATININE: 0.9 mg/dL (ref 0.61–1.24)
CREATININE: 0.9 mg/dL (ref 0.61–1.24)
Calcium, Ion: 1.03 mmol/L — ABNORMAL LOW (ref 1.15–1.40)
Calcium, Ion: 1.1 mmol/L — ABNORMAL LOW (ref 1.15–1.40)
Chloride: 107 mmol/L (ref 98–111)
Chloride: 106 mmol/L (ref 98–111)
Chloride: 105 mmol/L (ref 98–111)
Chloride: 105 mmol/L (ref 98–111)
Creatinine, Ser: 0.8 mg/dL (ref 0.61–1.24)
Creatinine, Ser: 0.7 mg/dL (ref 0.61–1.24)
Creatinine, Ser: 1 mg/dL (ref 0.61–1.24)
Glucose, Bld: 98 mg/dL (ref 70–99)
Glucose, Bld: 100 mg/dL — ABNORMAL HIGH (ref 70–99)
Glucose, Bld: 94 mg/dL (ref 70–99)
Glucose, Bld: 156 mg/dL — ABNORMAL HIGH (ref 70–99)
Glucose, Bld: 162 mg/dL — ABNORMAL HIGH (ref 70–99)
HCT: 31 % — ABNORMAL LOW (ref 39.0–52.0)
HCT: 29 % — ABNORMAL LOW (ref 39.0–52.0)
HCT: 20 % — ABNORMAL LOW (ref 39.0–52.0)
HCT: 21 % — ABNORMAL LOW (ref 39.0–52.0)
HEMATOCRIT: 19 % — AB (ref 39.0–52.0)
HEMOGLOBIN: 10.5 g/dL — AB (ref 13.0–17.0)
HEMOGLOBIN: 6.5 g/dL — AB (ref 13.0–17.0)
HEMOGLOBIN: 7.1 g/dL — AB (ref 13.0–17.0)
Hemoglobin: 9.9 g/dL — ABNORMAL LOW (ref 13.0–17.0)
Hemoglobin: 6.8 g/dL — CL (ref 13.0–17.0)
POTASSIUM: 4.4 mmol/L (ref 3.5–5.1)
POTASSIUM: 5.2 mmol/L — AB (ref 3.5–5.1)
Potassium: 4.6 mmol/L (ref 3.5–5.1)
Potassium: 5.8 mmol/L — ABNORMAL HIGH (ref 3.5–5.1)
Potassium: 4.8 mmol/L (ref 3.5–5.1)
SODIUM: 141 mmol/L (ref 135–145)
SODIUM: 138 mmol/L (ref 135–145)
SODIUM: 140 mmol/L (ref 135–145)
Sodium: 136 mmol/L (ref 135–145)
Sodium: 138 mmol/L (ref 135–145)
TCO2: 27 mmol/L (ref 22–32)
TCO2: 25 mmol/L (ref 22–32)
TCO2: 29 mmol/L (ref 22–32)
TCO2: 24 mmol/L (ref 22–32)
TCO2: 27 mmol/L (ref 22–32)

## 2018-07-11 LAB — GLUCOSE, CAPILLARY
GLUCOSE-CAPILLARY: 78 mg/dL (ref 70–99)
GLUCOSE-CAPILLARY: 122 mg/dL — AB (ref 70–99)
Glucose-Capillary: 130 mg/dL — ABNORMAL HIGH (ref 70–99)
Glucose-Capillary: 73 mg/dL (ref 70–99)

## 2018-07-11 LAB — POCT I-STAT 3, ART BLOOD GAS (G3+)
ACID-BASE DEFICIT: 2 mmol/L (ref 0.0–2.0)
BICARBONATE: 25.1 mmol/L (ref 20.0–28.0)
Bicarbonate: 25.6 mmol/L (ref 20.0–28.0)
Bicarbonate: 23.3 mmol/L (ref 20.0–28.0)
O2 SAT: 100 %
O2 SAT: 100 %
O2 Saturation: 100 %
PCO2 ART: 43 mmHg (ref 32.0–48.0)
PO2 ART: 457 mmHg — AB (ref 83.0–108.0)
TCO2: 27 mmol/L (ref 22–32)
TCO2: 25 mmol/L (ref 22–32)
TCO2: 26 mmol/L (ref 22–32)
pCO2 arterial: 40.9 mmHg (ref 32.0–48.0)
pCO2 arterial: 42.7 mmHg (ref 32.0–48.0)
pH, Arterial: 7.383 (ref 7.350–7.450)
pH, Arterial: 7.364 (ref 7.350–7.450)
pH, Arterial: 7.377 (ref 7.350–7.450)
pO2, Arterial: 220 mmHg — ABNORMAL HIGH (ref 83.0–108.0)
pO2, Arterial: 420 mmHg — ABNORMAL HIGH (ref 83.0–108.0)

## 2018-07-11 LAB — MAGNESIUM: Magnesium: 3.3 mg/dL — ABNORMAL HIGH (ref 1.7–2.4)

## 2018-07-11 LAB — APTT: APTT: 32 s (ref 24–36)

## 2018-07-11 LAB — CREATININE, SERUM
Creatinine, Ser: 1.02 mg/dL (ref 0.61–1.24)
GFR calc Af Amer: >60 mL/min (ref >60)
GFR calc non Af Amer: >60 mL/min (ref >60)

## 2018-07-11 LAB — PREPARE RBC (CROSSMATCH)

## 2018-07-11 LAB — PROTIME-INR
INR: 1.27
Prothrombin Time: 15.7 seconds — ABNORMAL HIGH (ref 11.4–15.2)

## 2018-07-11 LAB — PLATELET COUNT: Platelets: 168 10*3/uL (ref 150–400)

## 2018-07-11 SURGERY — CORONARY ARTERY BYPASS GRAFTING (CABG)
Anesthesia: General | Site: Chest

## 2018-07-11 MED ORDER — MIDAZOLAM HCL 5 MG/5ML IJ SOLN
INTRAMUSCULAR | Status: DC | PRN
  Administered 2018-07-11: 3 mg via INTRAVENOUS
  Administered 2018-07-11 (×3): 1 mg via INTRAVENOUS
  Administered 2018-07-11 (×2): 2 mg via INTRAVENOUS

## 2018-07-11 MED ORDER — EPHEDRINE 5 MG/ML INJ
INTRAVENOUS | Status: AC
  Filled 2018-07-11: qty 10

## 2018-07-11 MED ORDER — PHENYLEPHRINE HCL-NACL 20-0.9 MG/250ML-% IV SOLN
0.0000 ug/min | INTRAVENOUS | Status: DC
  Administered 2018-07-11: 15 ug/min via INTRAVENOUS

## 2018-07-11 MED ORDER — ONDANSETRON HCL 4 MG/2ML IJ SOLN
4.0000 mg | Freq: Four times a day (QID) | INTRAMUSCULAR | Status: DC | PRN
  Administered 2018-07-13: 4 mg via INTRAVENOUS
  Filled 2018-07-11: qty 2

## 2018-07-11 MED ORDER — SODIUM CHLORIDE 0.45 % IV SOLN: INTRAVENOUS | Status: DC | PRN

## 2018-07-11 MED ORDER — HEPARIN SODIUM (PORCINE) 1000 UNIT/ML IJ SOLN
INTRAMUSCULAR | Status: AC
  Filled 2018-07-11: qty 1

## 2018-07-11 MED ORDER — THROMBIN 5000 UNITS EX SOLR
CUTANEOUS | Status: AC
  Filled 2018-07-11: qty 10000

## 2018-07-11 MED ORDER — ALBUMIN HUMAN 5 % IV SOLN
INTRAVENOUS | Status: DC | PRN
  Administered 2018-07-11: 12:00:00 via INTRAVENOUS

## 2018-07-11 MED ORDER — PHENYLEPHRINE HCL 10 MG/ML IJ SOLN
INTRAMUSCULAR | Status: AC
  Filled 2018-07-11: qty 2

## 2018-07-11 MED ORDER — LIDOCAINE 2% (20 MG/ML) 5 ML SYRINGE
INTRAMUSCULAR | Status: AC
  Filled 2018-07-11: qty 5

## 2018-07-11 MED ORDER — METOPROLOL TARTRATE 5 MG/5ML IV SOLN
2.5000 mg | INTRAVENOUS | Status: DC | PRN
  Administered 2018-07-14 (×3): 5 mg via INTRAVENOUS
  Filled 2018-07-11 (×3): qty 5

## 2018-07-11 MED ORDER — LACTATED RINGERS IV SOLN: 500.0000 mL | Freq: Once | INTRAVENOUS | Status: DC | PRN

## 2018-07-11 MED ORDER — PROPOFOL 10 MG/ML IV BOLUS
INTRAVENOUS | Status: DC | PRN
  Administered 2018-07-11: 20 mg via INTRAVENOUS

## 2018-07-11 MED ORDER — INSULIN REGULAR BOLUS VIA INFUSION
0.0000 [IU] | Freq: Three times a day (TID) | INTRAVENOUS | Status: DC
  Filled 2018-07-11: qty 10

## 2018-07-11 MED ORDER — PHENYLEPHRINE 40 MCG/ML (10ML) SYRINGE FOR IV PUSH (FOR BLOOD PRESSURE SUPPORT)
PREFILLED_SYRINGE | INTRAVENOUS | Status: AC
  Filled 2018-07-11: qty 10

## 2018-07-11 MED ORDER — INSULIN ASPART 100 UNIT/ML ~~LOC~~ SOLN
0.0000 [IU] | SUBCUTANEOUS | Status: DC
  Administered 2018-07-11 (×2): 2 [IU] via SUBCUTANEOUS

## 2018-07-11 MED ORDER — SODIUM CHLORIDE 0.9 % IV SOLN
INTRAVENOUS | Status: DC
  Administered 2018-07-11: 10 mL/h via INTRAVENOUS

## 2018-07-11 MED ORDER — THROMBIN 5000 UNITS EX SOLR
CUTANEOUS | Status: DC | PRN
  Administered 2018-07-11 (×2): 5000 [IU] via TOPICAL

## 2018-07-11 MED ORDER — HEPARIN SODIUM (PORCINE) 1000 UNIT/ML IJ SOLN
INTRAMUSCULAR | Status: AC
  Filled 2018-07-11: qty 2

## 2018-07-11 MED ORDER — PANTOPRAZOLE SODIUM 40 MG PO TBEC
40.0000 mg | DELAYED_RELEASE_TABLET | Freq: Every day | ORAL | Status: DC
  Administered 2018-07-13 – 2018-07-16 (×4): 40 mg via ORAL
  Filled 2018-07-11 (×4): qty 1

## 2018-07-11 MED ORDER — ARTIFICIAL TEARS OPHTHALMIC OINT
TOPICAL_OINTMENT | OPHTHALMIC | Status: AC
  Filled 2018-07-11: qty 3.5

## 2018-07-11 MED ORDER — BISACODYL 10 MG RE SUPP: 10.0000 mg | Freq: Every day | RECTAL | Status: DC

## 2018-07-11 MED ORDER — GLYCOPYRROLATE PF 0.2 MG/ML IJ SOSY
PREFILLED_SYRINGE | INTRAMUSCULAR | Status: DC | PRN
  Administered 2018-07-11: .2 mg via INTRAVENOUS

## 2018-07-11 MED ORDER — MAGNESIUM SULFATE 4 GM/100ML IV SOLN
4.0000 g | Freq: Once | INTRAVENOUS | Status: AC
  Administered 2018-07-11: 4 g via INTRAVENOUS
  Filled 2018-07-11: qty 100

## 2018-07-11 MED ORDER — ROCURONIUM BROMIDE 50 MG/5ML IV SOSY
PREFILLED_SYRINGE | INTRAVENOUS | Status: AC
  Filled 2018-07-11: qty 20

## 2018-07-11 MED ORDER — 0.9 % SODIUM CHLORIDE (POUR BTL) OPTIME
TOPICAL | Status: DC | PRN
  Administered 2018-07-11: 6000 mL

## 2018-07-11 MED ORDER — LACTATED RINGERS IV SOLN
INTRAVENOUS | Status: DC | PRN
  Administered 2018-07-11 (×4): via INTRAVENOUS

## 2018-07-11 MED ORDER — TRAMADOL HCL 50 MG PO TABS: 50.0000 mg | ORAL_TABLET | ORAL | Status: DC | PRN

## 2018-07-11 MED ORDER — HEMOSTATIC AGENTS (NO CHARGE) OPTIME
TOPICAL | Status: DC | PRN
  Administered 2018-07-11: 1 via TOPICAL

## 2018-07-11 MED ORDER — SODIUM CHLORIDE 0.9 % IV SOLN
INTRAVENOUS | Status: DC | PRN
  Administered 2018-07-11: 12:00:00 via INTRAVENOUS

## 2018-07-11 MED ORDER — SODIUM CHLORIDE 0.9% FLUSH
3.0000 mL | Freq: Two times a day (BID) | INTRAVENOUS | Status: DC
  Administered 2018-07-12 – 2018-07-16 (×8): 3 mL via INTRAVENOUS

## 2018-07-11 MED ORDER — INSULIN REGULAR(HUMAN) IN NACL 100-0.9 UT/100ML-% IV SOLN
INTRAVENOUS | Status: DC
  Administered 2018-07-11: 0.9 [IU]/h via INTRAVENOUS

## 2018-07-11 MED ORDER — FENTANYL CITRATE (PF) 250 MCG/5ML IJ SOLN
INTRAMUSCULAR | Status: AC
  Filled 2018-07-11: qty 30

## 2018-07-11 MED ORDER — METOPROLOL TARTRATE 12.5 MG HALF TABLET
12.5000 mg | ORAL_TABLET | Freq: Once | ORAL | Status: DC
  Filled 2018-07-11: qty 1

## 2018-07-11 MED ORDER — MIDAZOLAM HCL 2 MG/2ML IJ SOLN: 2.0000 mg | INTRAMUSCULAR | Status: DC | PRN

## 2018-07-11 MED ORDER — SODIUM CHLORIDE 0.9% FLUSH: 3.0000 mL | INTRAVENOUS | Status: DC | PRN

## 2018-07-11 MED ORDER — HEPARIN SODIUM (PORCINE) 1000 UNIT/ML IJ SOLN
INTRAMUSCULAR | Status: DC | PRN
  Administered 2018-07-11: 24000 [IU] via INTRAVENOUS

## 2018-07-11 MED ORDER — ALBUMIN HUMAN 5 % IV SOLN
250.0000 mL | INTRAVENOUS | Status: AC | PRN
  Administered 2018-07-11 (×4): 12.5 g via INTRAVENOUS
  Filled 2018-07-11: qty 500

## 2018-07-11 MED ORDER — METOPROLOL TARTRATE 25 MG/10 ML ORAL SUSPENSION: 12.5000 mg | Freq: Two times a day (BID) | ORAL | Status: DC

## 2018-07-11 MED ORDER — SODIUM CHLORIDE (PF) 0.9 % IJ SOLN
OROMUCOSAL | Status: DC | PRN
  Administered 2018-07-11 (×2): 4 mL via TOPICAL

## 2018-07-11 MED ORDER — MORPHINE SULFATE (PF) 2 MG/ML IV SOLN
1.0000 mg | INTRAVENOUS | Status: AC | PRN
  Administered 2018-07-11 (×2): 2 mg via INTRAVENOUS
  Administered 2018-07-11: 4 mg via INTRAVENOUS
  Administered 2018-07-11: 2 mg via INTRAVENOUS
  Filled 2018-07-11: qty 1
  Filled 2018-07-11: qty 2
  Filled 2018-07-11 (×2): qty 1

## 2018-07-11 MED ORDER — FENTANYL CITRATE (PF) 250 MCG/5ML IJ SOLN
INTRAMUSCULAR | Status: DC | PRN
  Administered 2018-07-11: 250 ug via INTRAVENOUS
  Administered 2018-07-11: 150 ug via INTRAVENOUS
  Administered 2018-07-11: 200 ug via INTRAVENOUS
  Administered 2018-07-11: 100 ug via INTRAVENOUS
  Administered 2018-07-11: 50 ug via INTRAVENOUS
  Administered 2018-07-11: 100 ug via INTRAVENOUS
  Administered 2018-07-11: 150 ug via INTRAVENOUS
  Administered 2018-07-11: 50 ug via INTRAVENOUS
  Administered 2018-07-11 (×2): 100 ug via INTRAVENOUS

## 2018-07-11 MED ORDER — METOPROLOL TARTRATE 12.5 MG HALF TABLET
12.5000 mg | ORAL_TABLET | Freq: Two times a day (BID) | ORAL | Status: DC
  Administered 2018-07-12 – 2018-07-14 (×5): 12.5 mg via ORAL
  Filled 2018-07-11 (×5): qty 1

## 2018-07-11 MED ORDER — PROPOFOL 10 MG/ML IV BOLUS
INTRAVENOUS | Status: AC
  Filled 2018-07-11: qty 20

## 2018-07-11 MED ORDER — DEXMEDETOMIDINE HCL IN NACL 200 MCG/50ML IV SOLN
0.0000 ug/kg/h | INTRAVENOUS | Status: DC
  Administered 2018-07-11: 0.6 ug/kg/h via INTRAVENOUS

## 2018-07-11 MED ORDER — ACETAMINOPHEN 160 MG/5ML PO SOLN: 650.0000 mg | Freq: Once | ORAL | Status: AC

## 2018-07-11 MED ORDER — DOCUSATE SODIUM 100 MG PO CAPS
200.0000 mg | ORAL_CAPSULE | Freq: Every day | ORAL | Status: DC
  Administered 2018-07-12 – 2018-07-16 (×5): 200 mg via ORAL
  Filled 2018-07-11 (×5): qty 2

## 2018-07-11 MED ORDER — CHLORHEXIDINE GLUCONATE 0.12 % MT SOLN
15.0000 mL | Freq: Once | OROMUCOSAL | Status: AC
  Administered 2018-07-11: 15 mL via OROMUCOSAL
  Filled 2018-07-11: qty 15

## 2018-07-11 MED ORDER — MIDAZOLAM HCL (PF) 10 MG/2ML IJ SOLN
INTRAMUSCULAR | Status: AC
  Filled 2018-07-11: qty 2

## 2018-07-11 MED ORDER — CHLORHEXIDINE GLUCONATE 0.12 % MT SOLN
15.0000 mL | OROMUCOSAL | Status: AC
  Administered 2018-07-11: 15 mL via OROMUCOSAL

## 2018-07-11 MED ORDER — ACETAMINOPHEN 160 MG/5ML PO SOLN: 1000.0000 mg | Freq: Four times a day (QID) | ORAL | Status: DC

## 2018-07-11 MED ORDER — POTASSIUM CHLORIDE 10 MEQ/50ML IV SOLN: 10.0000 meq | INTRAVENOUS | Status: AC

## 2018-07-11 MED ORDER — PROTAMINE SULFATE 10 MG/ML IV SOLN
INTRAVENOUS | Status: DC | PRN
  Administered 2018-07-11: 240 mg via INTRAVENOUS

## 2018-07-11 MED ORDER — VANCOMYCIN HCL IN DEXTROSE 1-5 GM/200ML-% IV SOLN
1000.0000 mg | Freq: Once | INTRAVENOUS | Status: AC
  Administered 2018-07-11: 1000 mg via INTRAVENOUS
  Filled 2018-07-11: qty 200

## 2018-07-11 MED ORDER — NITROGLYCERIN IN D5W 200-5 MCG/ML-% IV SOLN
0.0000 ug/min | INTRAVENOUS | Status: DC
  Administered 2018-07-11: 0 ug/min via INTRAVENOUS

## 2018-07-11 MED ORDER — OXYCODONE HCL 5 MG PO TABS
5.0000 mg | ORAL_TABLET | ORAL | Status: DC | PRN
  Administered 2018-07-11 – 2018-07-12 (×2): 10 mg via ORAL
  Administered 2018-07-12 – 2018-07-13 (×8): 5 mg via ORAL
  Filled 2018-07-11 (×3): qty 1
  Filled 2018-07-11: qty 2
  Filled 2018-07-11 (×3): qty 1
  Filled 2018-07-11: qty 2
  Filled 2018-07-11 (×2): qty 1

## 2018-07-11 MED ORDER — SODIUM CHLORIDE 0.9 % IV SOLN: 250.0000 mL | INTRAVENOUS | Status: DC

## 2018-07-11 MED ORDER — LACTATED RINGERS IV SOLN
INTRAVENOUS | Status: DC
  Administered 2018-07-11: 20 mL/h via INTRAVENOUS

## 2018-07-11 MED ORDER — BISACODYL 5 MG PO TBEC
10.0000 mg | DELAYED_RELEASE_TABLET | Freq: Every day | ORAL | Status: DC
  Administered 2018-07-12 – 2018-07-14 (×3): 10 mg via ORAL
  Filled 2018-07-11 (×3): qty 2

## 2018-07-11 MED ORDER — MORPHINE SULFATE (PF) 2 MG/ML IV SOLN
2.0000 mg | INTRAVENOUS | Status: DC | PRN
  Administered 2018-07-12 (×2): 2 mg via INTRAVENOUS
  Filled 2018-07-11 (×2): qty 1

## 2018-07-11 MED ORDER — ACETAMINOPHEN 500 MG PO TABS
1000.0000 mg | ORAL_TABLET | Freq: Four times a day (QID) | ORAL | Status: DC
  Administered 2018-07-11 – 2018-07-16 (×15): 1000 mg via ORAL
  Filled 2018-07-11 (×15): qty 2

## 2018-07-11 MED ORDER — FAMOTIDINE IN NACL 20-0.9 MG/50ML-% IV SOLN
20.0000 mg | Freq: Two times a day (BID) | INTRAVENOUS | Status: AC
  Administered 2018-07-11: 20 mg via INTRAVENOUS

## 2018-07-11 MED ORDER — CHLORHEXIDINE GLUCONATE 4 % EX LIQD: 30.0000 mL | CUTANEOUS | Status: DC

## 2018-07-11 MED ORDER — ROCURONIUM BROMIDE 10 MG/ML (PF) SYRINGE
PREFILLED_SYRINGE | INTRAVENOUS | Status: DC | PRN
  Administered 2018-07-11 (×4): 50 mg via INTRAVENOUS

## 2018-07-11 MED ORDER — LEVOFLOXACIN IN D5W 750 MG/150ML IV SOLN
750.0000 mg | INTRAVENOUS | Status: AC
  Administered 2018-07-12: 750 mg via INTRAVENOUS
  Filled 2018-07-11: qty 150

## 2018-07-11 MED ORDER — LACTATED RINGERS IV SOLN: INTRAVENOUS | Status: DC

## 2018-07-11 MED ORDER — ACETAMINOPHEN 650 MG RE SUPP
650.0000 mg | Freq: Once | RECTAL | Status: AC
  Administered 2018-07-11: 650 mg via RECTAL

## 2018-07-11 MED ORDER — ARTIFICIAL TEARS OPHTHALMIC OINT
TOPICAL_OINTMENT | OPHTHALMIC | Status: DC | PRN
  Administered 2018-07-11: 1 via OPHTHALMIC

## 2018-07-11 MED ORDER — PROTAMINE SULFATE 10 MG/ML IV SOLN
INTRAVENOUS | Status: AC
  Filled 2018-07-11: qty 25

## 2018-07-11 MED ORDER — SUCCINYLCHOLINE CHLORIDE 200 MG/10ML IV SOSY
PREFILLED_SYRINGE | INTRAVENOUS | Status: AC
  Filled 2018-07-11: qty 10

## 2018-07-11 MED ORDER — PRAVASTATIN SODIUM 40 MG PO TABS
40.0000 mg | ORAL_TABLET | Freq: Every day | ORAL | Status: DC
  Administered 2018-07-12 – 2018-07-18 (×7): 40 mg via ORAL
  Filled 2018-07-11 (×8): qty 1

## 2018-07-11 MED ORDER — GLYCOPYRROLATE PF 0.2 MG/ML IJ SOSY
PREFILLED_SYRINGE | INTRAMUSCULAR | Status: AC
  Filled 2018-07-11: qty 1

## 2018-07-11 SURGICAL SUPPLY — 107 items
BAG DECANTER FOR FLEXI CONT (MISCELLANEOUS) ×3 IMPLANT
BANDAGE ACE 4X5 VEL STRL LF (GAUZE/BANDAGES/DRESSINGS) ×3 IMPLANT
BANDAGE ACE 6X5 VEL STRL LF (GAUZE/BANDAGES/DRESSINGS) ×3 IMPLANT
BANDAGE ELASTIC 4 VELCRO ST LF (GAUZE/BANDAGES/DRESSINGS) ×2 IMPLANT
BANDAGE ELASTIC 6 VELCRO ST LF (GAUZE/BANDAGES/DRESSINGS) ×1 IMPLANT
BASKET HEART (ORDER IN 25'S) (MISCELLANEOUS) ×1
BASKET HEART (ORDER IN 25S) (MISCELLANEOUS) ×2 IMPLANT
BLADE STERNUM SYSTEM 6 (BLADE) ×3 IMPLANT
BNDG GAUZE ELAST 4 BULKY (GAUZE/BANDAGES/DRESSINGS) ×3 IMPLANT
CANISTER SUCT 3000ML PPV (MISCELLANEOUS) ×3 IMPLANT
CATH ROBINSON RED A/P 18FR (CATHETERS) ×6 IMPLANT
CATH THORACIC 28FR (CATHETERS) ×3 IMPLANT
CATH THORACIC 36FR (CATHETERS) ×3 IMPLANT
CATH THORACIC 36FR RT ANG (CATHETERS) ×3 IMPLANT
CLIP VESOCCLUDE MED 24/CT (CLIP) IMPLANT
CLIP VESOCCLUDE SM WIDE 24/CT (CLIP) ×3 IMPLANT
CONT SPEC 4OZ CLIKSEAL STRL BL (MISCELLANEOUS) ×3 IMPLANT
COVER WAND RF STERILE (DRAPES) ×3 IMPLANT
CRADLE DONUT ADULT HEAD (MISCELLANEOUS) ×3 IMPLANT
DRAPE CARDIOVASCULAR INCISE (DRAPES) ×3
DRAPE SLUSH/WARMER DISC (DRAPES) ×3 IMPLANT
DRAPE SRG 135X102X78XABS (DRAPES) ×2 IMPLANT
DRSG COVADERM 4X14 (GAUZE/BANDAGES/DRESSINGS) ×3 IMPLANT
ELECT CAUTERY BLADE 6.4 (BLADE) ×3 IMPLANT
ELECT REM PT RETURN 9FT ADLT (ELECTROSURGICAL) ×6
ELECTRODE REM PT RTRN 9FT ADLT (ELECTROSURGICAL) ×4 IMPLANT
FELT TEFLON 1X6 (MISCELLANEOUS) ×3 IMPLANT
GAUZE SPONGE 4X4 12PLY STRL (GAUZE/BANDAGES/DRESSINGS) ×6 IMPLANT
GLOVE BIO SURGEON STRL SZ 6 (GLOVE) IMPLANT
GLOVE BIO SURGEON STRL SZ 6.5 (GLOVE) ×5 IMPLANT
GLOVE BIO SURGEON STRL SZ7 (GLOVE) IMPLANT
GLOVE BIO SURGEON STRL SZ7.5 (GLOVE) IMPLANT
GLOVE BIOGEL PI IND STRL 6 (GLOVE) IMPLANT
GLOVE BIOGEL PI IND STRL 6.5 (GLOVE) IMPLANT
GLOVE BIOGEL PI IND STRL 7.0 (GLOVE) IMPLANT
GLOVE BIOGEL PI IND STRL 8 (GLOVE) IMPLANT
GLOVE BIOGEL PI INDICATOR 6 (GLOVE)
GLOVE BIOGEL PI INDICATOR 6.5 (GLOVE)
GLOVE BIOGEL PI INDICATOR 7.0 (GLOVE)
GLOVE BIOGEL PI INDICATOR 8 (GLOVE) ×3
GLOVE ECLIPSE 8.0 STRL XLNG CF (GLOVE) ×8 IMPLANT
GLOVE EUDERMIC 7 POWDERFREE (GLOVE) ×6 IMPLANT
GLOVE ORTHO TXT STRL SZ7.5 (GLOVE) IMPLANT
GLOVE SURG SS PI 6.0 STRL IVOR (GLOVE) ×6 IMPLANT
GOWN STRL REUS W/ TWL LRG LVL3 (GOWN DISPOSABLE) ×9 IMPLANT
GOWN STRL REUS W/ TWL XL LVL3 (GOWN DISPOSABLE) ×3 IMPLANT
GOWN STRL REUS W/TWL LRG LVL3 (GOWN DISPOSABLE) ×15
GOWN STRL REUS W/TWL XL LVL3 (GOWN DISPOSABLE) ×6
HEMOSTAT POWDER SURGIFOAM 1G (HEMOSTASIS) ×8 IMPLANT
HEMOSTAT SURGICEL 2X14 (HEMOSTASIS) ×3 IMPLANT
INSERT FOGARTY 61MM (MISCELLANEOUS) IMPLANT
INSERT FOGARTY XLG (MISCELLANEOUS) IMPLANT
KIT BASIN OR (CUSTOM PROCEDURE TRAY) ×3 IMPLANT
KIT CATH CPB BARTLE (MISCELLANEOUS) ×3 IMPLANT
KIT SUCTION CATH 14FR (SUCTIONS) ×3 IMPLANT
KIT TURNOVER KIT B (KITS) ×3 IMPLANT
KIT VASOVIEW HEMOPRO 2 VH 4000 (KITS) IMPLANT
KIT VASOVIEW HEMOPRO VH 3000 (KITS) ×1 IMPLANT
MARKER SKIN DUAL TIP RULER LAB (MISCELLANEOUS) ×3 IMPLANT
NS IRRIG 1000ML POUR BTL (IV SOLUTION) ×16 IMPLANT
PACK E OPEN HEART (SUTURE) ×3 IMPLANT
PACK OPEN HEART (CUSTOM PROCEDURE TRAY) ×3 IMPLANT
PAD ARMBOARD 7.5X6 YLW CONV (MISCELLANEOUS) ×6 IMPLANT
PAD ELECT DEFIB RADIOL ZOLL (MISCELLANEOUS) ×3 IMPLANT
PENCIL BUTTON HOLSTER BLD 10FT (ELECTRODE) ×3 IMPLANT
PUNCH AORTIC ROTATE 4.0MM (MISCELLANEOUS) IMPLANT
PUNCH AORTIC ROTATE 4.5MM 8IN (MISCELLANEOUS) ×3 IMPLANT
PUNCH AORTIC ROTATE 5MM 8IN (MISCELLANEOUS) IMPLANT
SENSOR MYOCARDIAL TEMP (MISCELLANEOUS) ×1 IMPLANT
SOLUTION ANTI FOG 6CC (MISCELLANEOUS) ×3 IMPLANT
SPONGE INTESTINAL PEANUT (DISPOSABLE) IMPLANT
SPONGE LAP 18X18 X RAY DECT (DISPOSABLE) IMPLANT
SPONGE LAP 4X18 RFD (DISPOSABLE) ×3 IMPLANT
SUT BONE WAX W31G (SUTURE) ×3 IMPLANT
SUT MNCRL AB 4-0 PS2 18 (SUTURE) IMPLANT
SUT PROLENE 3 0 SH DA (SUTURE) IMPLANT
SUT PROLENE 3 0 SH1 36 (SUTURE) ×3 IMPLANT
SUT PROLENE 4 0 RB 1 (SUTURE)
SUT PROLENE 4 0 SH DA (SUTURE) IMPLANT
SUT PROLENE 4-0 RB1 .5 CRCL 36 (SUTURE) IMPLANT
SUT PROLENE 5 0 C 1 36 (SUTURE) IMPLANT
SUT PROLENE 6 0 C 1 30 (SUTURE) IMPLANT
SUT PROLENE 7 0 BV 1 (SUTURE) IMPLANT
SUT PROLENE 7 0 BV1 MDA (SUTURE) ×4 IMPLANT
SUT PROLENE 8 0 BV175 6 (SUTURE) IMPLANT
SUT SILK  1 MH (SUTURE)
SUT SILK 1 MH (SUTURE) IMPLANT
SUT STEEL STERNAL CCS#1 18IN (SUTURE) IMPLANT
SUT STEEL SZ 6 DBL 3X14 BALL (SUTURE) ×3 IMPLANT
SUT VIC AB 1 CTX 36 (SUTURE) ×6
SUT VIC AB 1 CTX36XBRD ANBCTR (SUTURE) ×4 IMPLANT
SUT VIC AB 2-0 CT1 27 (SUTURE)
SUT VIC AB 2-0 CT1 TAPERPNT 27 (SUTURE) IMPLANT
SUT VIC AB 2-0 CTX 27 (SUTURE) IMPLANT
SUT VIC AB 3-0 SH 27 (SUTURE)
SUT VIC AB 3-0 SH 27X BRD (SUTURE) IMPLANT
SUT VIC AB 3-0 X1 27 (SUTURE) IMPLANT
SUT VICRYL 4-0 PS2 18IN ABS (SUTURE) IMPLANT
SYSTEM SAHARA CHEST DRAIN ATS (WOUND CARE) ×3 IMPLANT
TAPE CLOTH SURG 4X10 WHT LF (GAUZE/BANDAGES/DRESSINGS) ×1 IMPLANT
TAPE PAPER 2X10 WHT MICROPORE (GAUZE/BANDAGES/DRESSINGS) ×1 IMPLANT
TOWEL GREEN STERILE (TOWEL DISPOSABLE) ×3 IMPLANT
TOWEL GREEN STERILE FF (TOWEL DISPOSABLE) ×3 IMPLANT
TRAY FOLEY SLVR 16FR TEMP STAT (SET/KITS/TRAYS/PACK) ×3 IMPLANT
TUBING INSUFFLATION (TUBING) ×3 IMPLANT
UNDERPAD 30X30 (UNDERPADS AND DIAPERS) ×3 IMPLANT
WATER STERILE IRR 1000ML POUR (IV SOLUTION) ×6 IMPLANT

## 2018-07-11 NOTE — Progress Notes (Signed)
NIF -20, VC 0.9L x2

## 2018-07-11 NOTE — Anesthesia Procedure Notes (Signed)
Arterial Line Insertion Start/End11/03/2018 7:05 AM, 07/11/2018 7:15 AM Performed by: Lowella Dell, CRNA, CRNA  Patient location: Pre-op. Preanesthetic checklist: patient identified, IV checked, site marked, risks and benefits discussed, surgical consent, monitors and equipment checked, pre-op evaluation, timeout performed and anesthesia consent Lidocaine 1% used for infiltration and patient sedated Left, radial was placed Catheter size: 20 G Hand hygiene performed  and maximum sterile barriers used  Allen's test indicative of satisfactory collateral circulation Attempts: 2 Procedure performed without using ultrasound guided technique. Following insertion, Biopatch and dressing applied. Post procedure assessment: normal  Patient tolerated the procedure well with no immediate complications. Additional procedure comments: Pre-CABG doppler not resulted this am, discussed with Fransisco Beau. Ok to put Aline on either side per Dr Cyndia Bent. [name]

## 2018-07-11 NOTE — Procedures (Signed)
Extubation Procedure Note  Patient Details:   Name: FLYNT BREEZE DOB: [birth year] MRN: [id number]   Airway Documentation:  Airway 8 mm (Active)  Secured at (cm) 21 cm 07/11/2018  5:00 PM  Measured From Lips 07/11/2018  5:00 PM  Secured Location Right 07/11/2018  5:00 PM  Secured By Pink Tape 07/11/2018  5:00 PM  Cuff Pressure (cm H2O) 28 cm H2O 07/11/2018  1:05 PM  Site Condition Dry 07/11/2018  1:05 PM   Vent end date: (not recorded) Vent end time: (not recorded)   Evaluation  O2 sats: stable throughout Complications: No apparent complications Patient did tolerate procedure well. Bilateral Breath Sounds: Clear   Yes  RT extubated patient to 4L nasal canula per 2H wean protocol. Patient oriented to time and place with good productive cough.  Dimple Nanas 07/11/2018, 5:54 PM

## 2018-07-11 NOTE — Transfer of Care (Signed)
Immediate Anesthesia Transfer of Care Note  Patient: Ryan Pittman  Procedure(s) Performed: CORONARY ARTERY BYPASS GRAFTING (CABG) x three, using left internal mammary artery and right leg greater saphenous vein harvested endoscopically (N/A Chest) TRANSESOPHAGEAL ECHOCARDIOGRAM (TEE) (N/A )  Patient Location: SICU  Anesthesia Type:General  Level of Consciousness: sedated and Patient remains intubated per anesthesia plan  Airway & Oxygen Therapy: Patient remains intubated per anesthesia plan and Patient placed on Ventilator (see vital sign flow sheet for setting)  Post-op Assessment: Report given to RN  Post vital signs: Reviewed and stable  Last Vitals:  BP 91/44 PAP 19/10 SpO2 99% on .50FiO2 HR 80 (DDD paced) RR -- see vent flowsheet for settings  Last Pain:  Vitals:   07/11/18 0603  TempSrc:   PainSc: 0-No pain         Complications: No apparent anesthesia complications

## 2018-07-11 NOTE — Progress Notes (Signed)
  Echocardiogram 2D Echocardiogram has been performed.  Ryan Pittman 07/11/2018, 9:11 AM

## 2018-07-11 NOTE — Interval H&P Note (Signed)
History and Physical Interval Note:  07/11/2018 5:54 AM  Ryan Pittman  has presented today for surgery, with the diagnosis of CAD  The various methods of treatment have been discussed with the patient and family. After consideration of risks, benefits and other options for treatment, the patient has consented to  Procedure(s): CORONARY ARTERY BYPASS GRAFTING (CABG) (N/A) TRANSESOPHAGEAL ECHOCARDIOGRAM (TEE) (N/A) as a surgical intervention .  The patient's history has been reviewed, patient examined, no change in status, stable for surgery.  I have reviewed the patient's chart and labs.  Questions were answered to the patient's satisfaction.     Gaye Pollack

## 2018-07-11 NOTE — Anesthesia Procedure Notes (Signed)
Central Venous Catheter Insertion Performed by: Nolon Nations, MD, anesthesiologist Start/End11/03/2018 7:10 AM, 07/11/2018 7:15 AM Patient location: Pre-op. Preanesthetic checklist: patient identified, IV checked, site marked, risks and benefits discussed, surgical consent, monitors and equipment checked, pre-op evaluation, timeout performed and anesthesia consent Hand hygiene performed  and maximum sterile barriers used  PA cath was placed.Swan type:thermodilution PA Cath depth:45 Procedure performed without using ultrasound guided technique. Attempts: 1 Patient tolerated the procedure well with no immediate complications.

## 2018-07-11 NOTE — Op Note (Signed)
CARDIOVASCULAR SURGERY OPERATIVE NOTE  07/11/2018  Surgeon:  Gaye Pollack, MD  First Assistant: Nicholes Rough,  PA-C   Preoperative Diagnosis:  Severe multi-vessel coronary artery disease   Postoperative Diagnosis:  Same   Procedure:  1. Median Sternotomy 2. Extracorporeal circulation 3.   Coronary artery bypass grafting x 5   Left internal mammary graft to the LAD  Sequential SVR to OM1, OM2, and OM3  SVG to distal RCA  4.   Endoscopic vein harvest from the right leg   Anesthesia:  General Endotracheal   Clinical History/Surgical Indication:  The patient is a 77 year old gentleman with a history of hypertension, hyperlipidemia, peripheral vascular disease status post left femoral-popliteal bypass, carotid artery disease, renal artery stenosis with chronic kidney disease, anemia due to GI bleeding, and IgG gammopathy followed at [facility]. He is followed by Dr. Elsie Stain and recently reported having some intermittent chest pain over the previous several months that lasted for only a few minutes at a time. This was not associated with any particular activity. He is also noted a significant decrease in his exertional tolerance. His wife, son, and daughter with him today and said that he frequently comes home during the day and takes a nap which she had never done before. He was referred to Dr. Alvester Chou and underwent an exercise treadmill test on 04/17/2018 which was markedly positive showing significant 2 mm ST depression in leads V4 and V5 with 1 mm of ST depression in the inferior leads concerning for ischemia. These changes resolved quickly in recovery. He underwent a gated cardiac CT with calcium scoring on 05/30/2018 which showed a coronary calcium score of 2858 placing him in the 90th percentile for his age and gender. There is heavy calcification in all 3 major coronary arteries. He  subsequently underwent cardiac catheterization on 06/24/2018. This showed a 90% ostial left circumflex stenosis. There is 80% stenosis at the ostium of the second marginal branch. The LAD had 60 to 70% proximal to mid vessel stenosis. The right coronary artery had 80% mid vessel stenosis over a long area. Left ventricular ejection fraction was 55 to 65%.   He has severe three-vessel coronary disease with intermittent episodes of chest discomfort that are not associated with any particular activity as well as exertional fatigue and shortness of breath. He had a markedly abnormal exercise tolerance test. I agree that coronary bypass graft surgery is the best treatment to prevent further ischemia and infarction and improve his quality of life. I reviewed the cardiac catheterization images with the patient and his family and answered all their questions. I discussed the operative procedure with the patient and family including alternatives, benefits and risks; including but not limited to bleeding, blood transfusion, infection, stroke, myocardial infarction, graft failure, heart block requiring a permanent pacemaker, organ dysfunction, and death. Ryan Pittman understands and agrees to proceed   Preparation:  The patient was seen in the preoperative holding area and the correct patient, correct operation were confirmed with the patient after reviewing the medical record and catheterization. The consent was signed by me. Preoperative antibiotics were given. A pulmonary arterial line and radial arterial line were placed by the anesthesia team. The patient was taken back to the operating room and positioned supine on the operating room table. After being placed under general endotracheal anesthesia by the anesthesia team a foley catheter was placed. The neck, chest, abdomen, and both legs were prepped with betadine soap and solution and draped in the usual sterile  manner. A surgical time-out was taken and the  correct patient and operative procedure were confirmed with the nursing and anesthesia staff.  TEE: performed by Dr. Renold Don:  This showed normal LV systolic function. There was trivial MR, no AS or AI.  Cardiopulmonary Bypass:  A median sternotomy was performed. There was a large lymph node present in the anterior mediastinum next to the brachiocephalic vein and this was removed and sent to pathology for permanent examination. The pericardium was opened in the midline. Right ventricular function appeared normal. The ascending aorta was of normal size and had palpable plaque along the right lateral wall of the ascending aorta. There were no contraindications to aortic cannulation or cross-clamping. The patient was fully systemically heparinized and the ACT was maintained > 400 sec. The proximal aortic arch was cannulated with a 20 F aortic cannula for arterial inflow. Venous cannulation was performed via the right atrial appendage using a two-staged venous cannula. An antegrade cardioplegia/vent cannula was inserted into the mid-ascending aorta. Aortic occlusion was performed with a single cross-clamp. Systemic cooling to 32 degrees Centigrade and topical cooling of the heart with iced saline were used. Hyperkalemic antegrade cold blood cardioplegia was used to induce diastolic arrest and was then given at about 20 minute intervals throughout the period of arrest to maintain myocardial temperature at or below 10 degrees centigrade. A temperature probe was inserted into the interventricular septum and an insulating pad was placed in the pericardium.   Left internal mammary harvest:  The left side of the sternum was retracted using the Rultract retractor. The left internal mammary artery was harvested as a pedicle graft. All side branches were clipped. It was a medium-sized vessel of good quality with excellent blood flow. It was ligated distally and divided. It was sprayed with topical papaverine  solution to prevent vasospasm.   Endoscopic vein harvest:  The right greater saphenous vein was harvested endoscopically through a 2 cm incision medial to the right knee. It was harvested from the upper thigh to below the knee. It was a medium-sized vein of good quality. The side branches were all ligated with 4-0 silk ties.    Coronary arteries:  The coronary arteries were examined.   LAD:  Large vessel with no distal disease.  LCX:  OM1 was medium caliber with no distal disease. OM2/OM3 were two sub-branches of a large OM and both were medium caliber vessels with no distal disease.  RCA:  Diffusely diseased but large and soft just before the origin of the PDA.   Grafts:  1. LIMA to the LAD: 2.5 mm. It was sewn end to side using 8-0 prolene continuous suture. 2. SVG to distal RCA:  3.5 mm. It was sewn end to side using 7-0 prolene continuous suture. 3. Sequential SVG to OM1:  1.6 mm. It was sewn sequential side to side using 7-0 prolene continuous suture. 4. Sequential SVG to OM2:  1.6 mm. It was sewn sequential side to side using 7-0 prolene continuous suture. 5.   Sequential SVG to OM3:  1.6 mm. It was sewn sequential end to side using 7-0 prolene continuous suture.  The proximal vein graft anastomoses were performed to the mid-ascending aorta using continuous 6-0 prolene suture. Graft markers were placed around the proximal anastomoses.   Completion:  The patient was rewarmed to 37 degrees Centigrade. The clamp was removed from the LIMA pedicle and there was rapid warming of the septum and return of ventricular fibrillation. The crossclamp was removed  with a time of 86 minutes. There was spontaneous return of sinus rhythm. The distal and proximal anastomoses were checked for hemostasis. The position of the grafts was satisfactory. Two temporary epicardial pacing wires were placed on the right atrium and two on the right ventricle. The patient was weaned from CPB without  difficulty on no inotropes. CPB time was 102 minutes. Cardiac output was 6 LPM. TEE showed normal LV systolic function with trivial MR, no AI. Heparin was fully reversed with protamine and the aortic and venous cannulas removed. Hemostasis was achieved. Mediastinal and left pleural drainage tubes were placed. The sternum was closed with double #6 stainless steel wires. The fascia was closed with continuous # 1 vicryl suture. The subcutaneous tissue was closed with 2-0 vicryl continuous suture. The skin was closed with 3-0 vicryl subcuticular suture. All sponge, needle, and instrument counts were reported correct at the end of the case. Dry sterile dressings were placed over the incisions and around the chest tubes which were connected to pleurevac suction. The patient was then transported to the surgical intensive care unit in stable condition.

## 2018-07-11 NOTE — OR Nursing (Signed)
12:30 - 20 minute call to SICU charge nurse

## 2018-07-11 NOTE — Anesthesia Procedure Notes (Signed)
Procedure Name: Intubation Date/Time: 07/11/2018 7:46 AM Performed by: Lowella Dell, CRNA Pre-anesthesia Checklist: Patient identified, Emergency Drugs available, Suction available and Patient being monitored Patient Re-evaluated:Patient Re-evaluated prior to induction Oxygen Delivery Method: Circle System Utilized Preoxygenation: Pre-oxygenation with 100% oxygen Induction Type: IV induction Ventilation: Mask ventilation without difficulty Laryngoscope Size: Mac and 4 Grade View: Grade I Tube type: Oral Tube size: 8.0 mm Number of attempts: 1 Airway Equipment and Method: Stylet Placement Confirmation: ETT inserted through vocal cords under direct vision,  positive ETCO2 and breath sounds checked- equal and bilateral Secured at: 21 cm Tube secured with: Tape Dental Injury: Teeth and Oropharynx as per pre-operative assessment

## 2018-07-11 NOTE — Progress Notes (Signed)
CTS PM Rounds  Stable after CABG, off drips Extubated Labs ok

## 2018-07-11 NOTE — Anesthesia Procedure Notes (Signed)
Central Venous Catheter Insertion Performed by: Nolon Nations, MD, anesthesiologist Start/End11/03/2018 6:55 AM, 07/11/2018 7:10 AM Patient location: Pre-op. Preanesthetic checklist: patient identified, IV checked, site marked, risks and benefits discussed, surgical consent, monitors and equipment checked, pre-op evaluation, timeout performed and anesthesia consent Position: Trendelenburg Lidocaine 1% used for infiltration and patient sedated Hand hygiene performed , maximum sterile barriers used  and Seldinger technique used Catheter size: 8.5 Fr Total catheter length 10. Central line was placed.Sheath introducer Swan type:thermodilution PA Cath depth:45 Procedure performed using ultrasound guided technique. Ultrasound Notes:anatomy identified, needle tip was noted to be adjacent to the nerve/plexus identified, no ultrasound evidence of intravascular and/or intraneural injection and image(s) printed for medical record Attempts: 1 Following insertion, line sutured, dressing applied and Biopatch. Post procedure assessment: blood return through all ports, free fluid flow and no air  Patient tolerated the procedure well with no immediate complications.

## 2018-07-11 NOTE — Brief Op Note (Signed)
07/11/2018  1:12 PM  PATIENT:  Ryan Pittman  77 y.o. male  PRE-OPERATIVE DIAGNOSIS:  CAD  POST-OPERATIVE DIAGNOSIS:  CAD  PROCEDURE:  Procedure(s): CORONARY ARTERY BYPASS GRAFTING (CABG) x three, using left internal mammary artery and right leg greater saphenous vein harvested endoscopically (N/A) TRANSESOPHAGEAL ECHOCARDIOGRAM (TEE) (N/A)  LIMA to LAD SVG to OM1, OM2, and OM3 SVG to RCA  SURGEON:  Surgeon(s) and Role:    * Bartle, Fernande Boyden, MD - Primary  PHYSICIAN ASSISTANT: Nicholes Rough, PA-C   ANESTHESIA:   general  EBL:  800 mL   BLOOD ADMINISTERED:none  DRAINS: routine   LOCAL MEDICATIONS USED:  NONE  SPECIMEN:  No Specimen  DISPOSITION OF SPECIMEN:  N/A  COUNTS:  YES  DICTATION: .Dragon Dictation  PLAN OF CARE: Admit to inpatient   PATIENT DISPOSITION:  ICU - intubated and hemodynamically stable.   Delay start of Pharmacological VTE agent (>24hrs) due to surgical blood loss or risk of bleeding: yes

## 2018-07-12 ENCOUNTER — Inpatient Hospital Stay (HOSPITAL_COMMUNITY): Payer: Medicare Other

## 2018-07-12 ENCOUNTER — Encounter (HOSPITAL_COMMUNITY): Payer: Self-pay | Admitting: Surgery

## 2018-07-12 LAB — BASIC METABOLIC PANEL
Anion gap: 4 — ABNORMAL LOW (ref 5–15)
BUN: 17 mg/dL (ref 8–23)
CALCIUM: 8.3 mg/dL — AB (ref 8.9–10.3)
CO2: 22 mmol/L (ref 22–32)
CREATININE: 1.08 mg/dL (ref 0.61–1.24)
Chloride: 111 mmol/L (ref 98–111)
GFR calc Af Amer: >60 mL/min (ref >60)
GFR calc non Af Amer: >60 mL/min (ref >60)
GLUCOSE: 121 mg/dL — AB (ref 70–99)
Potassium: 4.4 mmol/L (ref 3.5–5.1)
Sodium: 137 mmol/L (ref 135–145)

## 2018-07-12 LAB — GLUCOSE, CAPILLARY
GLUCOSE-CAPILLARY: 90 mg/dL (ref 70–99)
GLUCOSE-CAPILLARY: 104 mg/dL — AB (ref 70–99)
GLUCOSE-CAPILLARY: 111 mg/dL — AB (ref 70–99)
Glucose-Capillary: 103 mg/dL — ABNORMAL HIGH (ref 70–99)
Glucose-Capillary: 113 mg/dL — ABNORMAL HIGH (ref 70–99)
Glucose-Capillary: 128 mg/dL — ABNORMAL HIGH (ref 70–99)

## 2018-07-12 LAB — POCT I-STAT, CHEM 8
BUN: 15 mg/dL (ref 8–23)
BUN: 18 mg/dL (ref 8–23)
Calcium, Ion: 1.24 mmol/L (ref 1.15–1.40)
Calcium, Ion: 1.2 mmol/L (ref 1.15–1.40)
Chloride: 113 mmol/L — ABNORMAL HIGH (ref 98–111)
Chloride: 102 mmol/L (ref 98–111)
Creatinine, Ser: 0.8 mg/dL (ref 0.61–1.24)
Creatinine, Ser: 1.3 mg/dL — ABNORMAL HIGH (ref 0.61–1.24)
GLUCOSE: 133 mg/dL — AB (ref 70–99)
GLUCOSE: 136 mg/dL — AB (ref 70–99)
HCT: 22 % — ABNORMAL LOW (ref 39.0–52.0)
HCT: 24 % — ABNORMAL LOW (ref 39.0–52.0)
HEMOGLOBIN: 8.2 g/dL — AB (ref 13.0–17.0)
Hemoglobin: 7.5 g/dL — ABNORMAL LOW (ref 13.0–17.0)
POTASSIUM: 4.1 mmol/L (ref 3.5–5.1)
Potassium: 4.1 mmol/L (ref 3.5–5.1)
Sodium: 139 mmol/L (ref 135–145)
Sodium: 136 mmol/L (ref 135–145)
TCO2: 19 mmol/L — ABNORMAL LOW (ref 22–32)
TCO2: 25 mmol/L (ref 22–32)

## 2018-07-12 LAB — CBC
HCT: 25 % — ABNORMAL LOW (ref 39.0–52.0)
HCT: 26.9 % — ABNORMAL LOW (ref 39.0–52.0)
Hemoglobin: 8.1 g/dL — ABNORMAL LOW (ref 13.0–17.0)
Hemoglobin: 8.7 g/dL — ABNORMAL LOW (ref 13.0–17.0)
MCH: 30.9 pg (ref 26.0–34.0)
MCH: 31.1 pg (ref 26.0–34.0)
MCHC: 32.4 g/dL (ref 30.0–36.0)
MCHC: 32.3 g/dL (ref 30.0–36.0)
MCV: 95.4 fL (ref 80.0–100.0)
MCV: 96.1 fL (ref 80.0–100.0)
NRBC: 0 % (ref 0.0–0.2)
PLATELETS: 136 10*3/uL — AB (ref 150–400)
Platelets: 141 10*3/uL — ABNORMAL LOW (ref 150–400)
RBC: 2.62 MIL/uL — ABNORMAL LOW (ref 4.22–5.81)
RBC: 2.8 MIL/uL — AB (ref 4.22–5.81)
RDW: 14.4 % (ref 11.5–15.5)
RDW: 14.6 % (ref 11.5–15.5)
WBC: 16.2 10*3/uL — ABNORMAL HIGH (ref 4.0–10.5)
WBC: 17 10*3/uL — ABNORMAL HIGH (ref 4.0–10.5)
nRBC: 0 % (ref 0.0–0.2)

## 2018-07-12 LAB — POCT I-STAT 3, ART BLOOD GAS (G3+)
ACID-BASE DEFICIT: 6 mmol/L — AB (ref 0.0–2.0)
Acid-base deficit: 6 mmol/L — ABNORMAL HIGH (ref 0.0–2.0)
Acid-base deficit: 8 mmol/L — ABNORMAL HIGH (ref 0.0–2.0)
BICARBONATE: 18.6 mmol/L — AB (ref 20.0–28.0)
BICARBONATE: 19 mmol/L — AB (ref 20.0–28.0)
Bicarbonate: 17.3 mmol/L — ABNORMAL LOW (ref 20.0–28.0)
O2 SAT: 98 %
O2 SAT: 99 %
O2 Saturation: 97 %
PCO2 ART: 31 mmHg — AB (ref 32.0–48.0)
PH ART: 7.377 (ref 7.350–7.450)
Patient temperature: 36.5
Patient temperature: 37
TCO2: 20 mmol/L — AB (ref 22–32)
TCO2: 20 mmol/L — ABNORMAL LOW (ref 22–32)
TCO2: 18 mmol/L — ABNORMAL LOW (ref 22–32)
pCO2 arterial: 35.5 mmHg (ref 32.0–48.0)
pCO2 arterial: 31.9 mmHg — ABNORMAL LOW (ref 32.0–48.0)
pH, Arterial: 7.335 — ABNORMAL LOW (ref 7.350–7.450)
pH, Arterial: 7.342 — ABNORMAL LOW (ref 7.350–7.450)
pO2, Arterial: 107 mmHg (ref 83.0–108.0)
pO2, Arterial: 144 mmHg — ABNORMAL HIGH (ref 83.0–108.0)
pO2, Arterial: 98 mmHg (ref 83.0–108.0)

## 2018-07-12 LAB — MAGNESIUM
Magnesium: 2.5 mg/dL — ABNORMAL HIGH (ref 1.7–2.4)
Magnesium: 2.1 mg/dL (ref 1.7–2.4)

## 2018-07-12 LAB — HEMOGLOBIN AND HEMATOCRIT, BLOOD
HCT: 22.2 % — ABNORMAL LOW (ref 39.0–52.0)
Hemoglobin: 7.3 g/dL — ABNORMAL LOW (ref 13.0–17.0)

## 2018-07-12 LAB — POCT I-STAT 4, (NA,K, GLUC, HGB,HCT)
Glucose, Bld: 107 mg/dL — ABNORMAL HIGH (ref 70–99)
HCT: 25 % — ABNORMAL LOW (ref 39.0–52.0)
Hemoglobin: 8.5 g/dL — ABNORMAL LOW (ref 13.0–17.0)
POTASSIUM: 4.5 mmol/L (ref 3.5–5.1)
Sodium: 140 mmol/L (ref 135–145)

## 2018-07-12 LAB — CREATININE, SERUM
Creatinine, Ser: 1.26 mg/dL — ABNORMAL HIGH (ref 0.61–1.24)
GFR calc Af Amer: >60 mL/min (ref >60)
GFR calc non Af Amer: 52 mL/min — ABNORMAL LOW (ref >60)

## 2018-07-12 MED ORDER — FUROSEMIDE 10 MG/ML IJ SOLN
40.0000 mg | Freq: Two times a day (BID) | INTRAMUSCULAR | Status: AC
  Administered 2018-07-12 (×2): 40 mg via INTRAVENOUS
  Filled 2018-07-12 (×2): qty 4

## 2018-07-12 MED ORDER — AMIODARONE LOAD VIA INFUSION
150.0000 mg | Freq: Once | INTRAVENOUS | Status: AC
  Administered 2018-07-12: 150 mg via INTRAVENOUS

## 2018-07-12 MED ORDER — DIPHENHYDRAMINE HCL 25 MG PO CAPS
25.0000 mg | ORAL_CAPSULE | Freq: Four times a day (QID) | ORAL | Status: DC | PRN
  Administered 2018-07-12 – 2018-07-14 (×3): 25 mg via ORAL
  Filled 2018-07-12 (×3): qty 1

## 2018-07-12 MED ORDER — AMIODARONE HCL IN DEXTROSE 360-4.14 MG/200ML-% IV SOLN
30.0000 mg/h | INTRAVENOUS | Status: DC
  Administered 2018-07-13 – 2018-07-16 (×8): 30 mg/h via INTRAVENOUS
  Filled 2018-07-12 (×9): qty 200

## 2018-07-12 MED ORDER — POTASSIUM CHLORIDE CRYS ER 20 MEQ PO TBCR
20.0000 meq | EXTENDED_RELEASE_TABLET | Freq: Every day | ORAL | Status: AC
  Administered 2018-07-12 – 2018-07-13 (×2): 20 meq via ORAL
  Filled 2018-07-12 (×2): qty 1

## 2018-07-12 MED ORDER — AMIODARONE HCL IN DEXTROSE 360-4.14 MG/200ML-% IV SOLN
60.0000 mg/h | INTRAVENOUS | Status: AC
  Administered 2018-07-12 (×2): 60 mg/h via INTRAVENOUS
  Filled 2018-07-12: qty 200

## 2018-07-12 MED ORDER — TRAMADOL HCL 50 MG PO TABS
50.0000 mg | ORAL_TABLET | ORAL | Status: DC | PRN
  Administered 2018-07-12 – 2018-07-15 (×3): 50 mg via ORAL
  Filled 2018-07-12 (×3): qty 1

## 2018-07-12 MED ORDER — AMIODARONE HCL IN DEXTROSE 360-4.14 MG/200ML-% IV SOLN
INTRAVENOUS | Status: AC
  Filled 2018-07-12: qty 200

## 2018-07-12 MED ORDER — FE FUMARATE-B12-VIT C-FA-IFC PO CAPS
1.0000 | ORAL_CAPSULE | Freq: Two times a day (BID) | ORAL | Status: DC
  Administered 2018-07-12 – 2018-07-18 (×12): 1 via ORAL
  Filled 2018-07-12 (×12): qty 1

## 2018-07-12 MED FILL — Sodium Bicarbonate IV Soln 8.4%: INTRAVENOUS | Qty: 50 | Status: AC

## 2018-07-12 MED FILL — Lidocaine HCl(Cardiac) IV PF Soln Pref Syr 100 MG/5ML (2%): INTRAVENOUS | Qty: 5 | Status: AC

## 2018-07-12 MED FILL — Sodium Chloride IV Soln 0.9%: INTRAVENOUS | Qty: 3000 | Status: AC

## 2018-07-12 MED FILL — Heparin Sodium (Porcine) Inj 1000 Unit/ML: INTRAMUSCULAR | Qty: 10 | Status: AC

## 2018-07-12 MED FILL — Heparin Sodium (Porcine) Inj 1000 Unit/ML: INTRAMUSCULAR | Qty: 30 | Status: AC

## 2018-07-12 MED FILL — Potassium Chloride Inj 2 mEq/ML: INTRAVENOUS | Qty: 40 | Status: AC

## 2018-07-12 MED FILL — Electrolyte-R (PH 7.4) Solution: INTRAVENOUS | Qty: 3000 | Status: AC

## 2018-07-12 MED FILL — Magnesium Sulfate Inj 50%: INTRAMUSCULAR | Qty: 10 | Status: AC

## 2018-07-12 MED FILL — Mannitol IV Soln 20%: INTRAVENOUS | Qty: 500 | Status: AC

## 2018-07-12 NOTE — Progress Notes (Signed)
      [address]       [place] [address]             [phone]      POD # 1 CABG x 5  Up in chair Went into rapid atrial fib this afternoon Now rate controlled on amiodarone infusion BP 104/65   Pulse (!) 113   Temp 98.5 F (36.9 C) (Oral)   Resp 19   Ht 5' 7.5" (1.715 m)   Wt 73.6 kg   SpO2 98%   BMI 25.04 kg/m   Intake/Output Summary (Last 24 hours) at 07/12/2018 1808 Last data filed at 07/12/2018 1700 Gross per 24 hour  Intake 1687.03 ml  Output 2360 ml  Net -672.97 ml   K= 4.1, creatinine 1.3 Hct= 24  Steven C. Roxan Hockey, MD [facility] [phone]

## 2018-07-12 NOTE — Discharge Instructions (Signed)

## 2018-07-12 NOTE — Discharge Summary (Signed)
Physician Discharge Summary  Patient ID: Ryan Pittman MRN: [id number] DOB/AGE: June 17, 1941 77 y.o.  Admit date: 07/11/2018 Discharge date: 07/18/2018  Admission Diagnoses: Patient Active Problem List   Diagnosis Date Noted  . CAD (coronary artery disease) 07/11/2018    Discharge Diagnoses:  Active Problems:   CAD (coronary artery disease)   Discharged Condition: good  HPI:   The patient is a 77 year old gentleman with a history of hypertension, hyperlipidemia, peripheral vascular disease status post left femoral-popliteal bypass, carotid artery disease, renal artery stenosis with chronic kidney disease, anemia due to GI bleeding, and IgG gammopathy followed at [facility]. He is followed by Dr. Elsie Stain and recently reported having some intermittent chest pain over the previous several months that lasted for only a few minutes at a time. This was not associated with any particular activity. He is also noted a significant decrease in his exertional tolerance. His wife, son, and daughter with him today and said that he frequently comes home during the day and takes a nap which she had never done before. He was referred to Dr. Alvester Chou and underwent an exercise treadmill test on 04/17/2018 which was markedly positive showing significant 2 mm ST depression in leads V4 and V5 with 1 mm of ST depression in the inferior leads concerning for ischemia. These changes resolved quickly in recovery. He underwent a gated cardiac CT with calcium scoring on 05/30/2018 which showed a coronary calcium score of 2858 placing him in the 90th percentile for his age and gender. There is heavy calcification in all 3 major coronary arteries. He subsequently underwent cardiac catheterization on 06/24/2018. This showed a 90% ostial left circumflex stenosis. There is 80% stenosis at the ostium of the second marginal branch. The LAD had 60 to 70% proximal to mid vessel stenosis. The right coronary artery had 80% mid vessel  stenosis over a long area. Left ventricular ejection fraction was 55 to 65%.  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 He is a previous smoker with a 50-pack-year history but quit about 8 years ago. He has no family history of heart disease.   Hospital Course:   On 07/11/2018 Mr. Paulsen underwent a coronary artery bypass grafting x5 with Dr. Cyndia Bent.  He tolerated the procedure well and transferred to the surgical ICU.  He was extubated timely manner.  Postop day 1 he remained hemodynamically stable and in normal sinus rhythm.  We continue low-dose Lopressor for blood pressure and heart rate control.  We initiated a diuretic regimen since his weight was up about 10 pounds.  We discontinued his chest tubes and lines.  He did have some expected acute blood loss anemia and we initiated iron.  He continued to progress. POD 2 he went into rapid atrial fibrillation but converted to normal sinus rhythm with IV amiodarone.  He does have some nausea and vomiting due to the amiodarone.  We added Reglan due to his distended and hypoactive bowel sounds.  Postop day 3 he was back in atrial fib with RVR we administered an amiodarone bolus and increase his metoprolol.  His creatinine increased to 1.7.  We resumed his Synthroid.  He did have some leukocytosis which may be a SIRS reaction to CPB.  We added digoxin for rate control.  His postop ileus was improving and his bowels began to move.  We held off on diuresis for his volume overload due to his ileus.  We added Tussionex to help with his cough.  He converted back to normal sinus rhythm on amiodarone and digoxin.  We resumed diuresis since his creatinine came back down to baseline.  He was stable to transfer to 4E for continued care.   Consults: None  Significant Diagnostic Studies:   CLINICAL DATA:  Chest tube.  Post CABG.  EXAM: PORTABLE CHEST 1 VIEW  COMPARISON:  07/11/2018  FINDINGS: Mediastinal drain, 2 left-sided chest  tubes and right IJ Swan-Ganz catheter unchanged. Lungs are adequately inflated with persistent left base opacification likely small effusion with atelectasis. No pneumothorax. Cardiomediastinal silhouette and remainder of the exam is unchanged.  IMPRESSION: Stable left base opacification likely small effusion with atelectasis.  Tubes and lines as described.   Electronically Signed   By: Marin Olp M.D.   On: 07/12/2018 08:46  Treatments:   CARDIOVASCULAR SURGERY OPERATIVE NOTE  07/11/2018  Surgeon:  Gaye Pollack, MD  First Assistant: Nicholes Rough,  PA-C   Preoperative Diagnosis:  Severe multi-vessel coronary artery disease   Postoperative Diagnosis:  Same   Procedure:  1. Median Sternotomy 2. Extracorporeal circulation 3.   Coronary artery bypass grafting x 5   Left internal mammary graft to the LAD  Sequential SVR to OM1, OM2, and OM3  SVG to distal RCA  4.   Endoscopic vein harvest from the right leg   Anesthesia:  General Endotracheal  Discharge Exam: Blood pressure (!) 146/66, pulse 81, temperature 98.7 F (37.1 C), temperature source Oral, resp. rate (!) 25, height 5' 7.5" (1.715 m), weight 69.6 kg, SpO2 96 %.   General appearance: alert, cooperative and no distress Heart: regular rate and rhythm, S1, S2 normal, no murmur, click, rub or gallop Lungs: clear to auscultation bilaterally Abdomen: soft, non-tender; bowel sounds normal; no masses,  no organomegaly Extremities: extremities normal, atraumatic, no cyanosis or edema Wound: clean and dry  Disposition: Discharge disposition: 01-Home or Self Care        Allergies as of 07/18/2018      Reactions   Penicillins Itching, Other (See Comments)   PATIENT HAS HAD A PCN REACTION WITH IMMEDIATE RASH, FACIAL/TONGUE/THROAT SWELLING, SOB, OR LIGHTHEADEDNESS WITH HYPOTENSION:  #  #  YES  #  #  Has patient had a PCN reaction causing severe rash involving mucus membranes or  skin necrosis: No Has patient had a PCN reaction that required hospitalization: No Has patient had a PCN reaction occurring within the last 10 years: No If all of the above answers are "NO", then may proceed with Cephalosporin use.   Ace Inhibitors Cough   Aspirin Other (See Comments)   H/o GI bleed   Celebrex [celecoxib] Other (See Comments)   GI bleed   Lipitor [atorvastatin] Other (See Comments)   myalgias      Medication List    STOP taking these medications   amLODipine 2.5 MG tablet Commonly known as:  NORVASC   CoQ-10 100 MG Caps   losartan 100 MG tablet Commonly known as:  COZAAR     TAKE these medications   acetaminophen 325 MG tablet Commonly known as:  TYLENOL Take 2 tablets (650 mg total) by mouth every 6 (six) hours as needed for mild pain.   amiodarone 200 MG tablet Commonly known as:  PACERONE Take 1 tablet (200 mg total) by mouth 2 (two) times daily.   digoxin 0.125 MG tablet Commonly known as:  LANOXIN Take 1 tablet (0.125 mg total) by mouth daily.   FISH OIL OMEGA-3 PO Take 1 capsule by mouth daily.  hydrOXYzine 10 MG tablet Commonly known as:  ATARAX/VISTARIL Take 0.5-1 tablets (5-10 mg total) by mouth 3 (three) times daily as needed for anxiety.   levothyroxine 75 MCG tablet Commonly known as:  SYNTHROID, LEVOTHROID Take 75 mcg by mouth daily before breakfast. Takes 1.5 tabs every Thursday per pt   metoprolol tartrate 25 MG tablet Commonly known as:  LOPRESSOR Take 0.5 tablets (12.5 mg total) by mouth 2 (two) times daily.   oxyCODONE 5 MG immediate release tablet Commonly known as:  Oxy IR/ROXICODONE Take 1 tablet (5 mg total) by mouth every 6 (six) hours as needed for severe pain.   PLAVIX 75 MG tablet Generic drug:  clopidogrel Take 75 mg by mouth daily.   pravastatin 40 MG tablet Commonly known as:  PRAVACHOL Take 1 tablet (40 mg total) by mouth daily.   traZODone 100 MG tablet Commonly known as:  DESYREL Take 50 mg by mouth  at bedtime as needed for sleep.   VISINE 0.05 % ophthalmic solution Generic drug:  tetrahydrozoline Place 1-2 drops into both eyes daily as needed (for dry eyes).   vitamin C 500 MG tablet Commonly known as:  ASCORBIC ACID Take 500 mg by mouth daily.      Follow-up Information    Tonia Ghent, MD. Call in 1 day(s).   Specialty:  Family Medicine Contact information: [place] [address] [phone]        Gaye Pollack, MD Follow up.   Specialty:  Cardiothoracic Surgery Why:  Your routine follow-up appointment is on 08/19/2018 at 1pm. Please arrive at 12:30pm for a chest xray located at [facility] which is on the first floor of our building. Contact information: [address]  [place] [address] [phone]        Erlene Quan, PA-C Follow up.   Specialties:  Cardiology, Radiology Why:  Kerin Ransom, [place] 12/3 @9 :30 am ([facility])  Contact information: [place] [address] [phone]        nursing suture removal appointment Follow up.   Why:  Your suture removal appointment is on 07/25/2018 at 10:00AM Contact information: Dr. Vivi Martens office         The patient has been discharged on:   1.Beta Blocker:  Yes [  yes ]                              No   [   ]                              If No, reason:  2.Ace Inhibitor/ARB: Yes [   ]                                     No  [ no   ]                                     If No, reason: allergy  3.Statin:   Yes [ yes  ]                  No  [   ]  If No, reason:  4.Ecasa:  Yes  [   ]                  No   [ no  ]                  If No, reason: allergy   Signed: [name] 07/18/2018, 7:57 AM

## 2018-07-12 NOTE — Anesthesia Postprocedure Evaluation (Signed)
Anesthesia Post Note  Patient: Ryan Pittman  Procedure(s) Performed: CORONARY ARTERY BYPASS GRAFTING (CABG) x three, using left internal mammary artery and right leg greater saphenous vein harvested endoscopically (N/A Chest) TRANSESOPHAGEAL ECHOCARDIOGRAM (TEE) (N/A )     Patient location during evaluation: ICU Anesthesia Type: General Level of consciousness: awake and alert Pain management: pain level controlled Vital Signs Assessment: post-procedure vital signs reviewed and stable Respiratory status: spontaneous breathing, nonlabored ventilation, respiratory function stable and patient connected to nasal cannula oxygen Cardiovascular status: blood pressure returned to baseline and stable Postop Assessment: no apparent nausea or vomiting Anesthetic complications: no Comments: Extubated POD#0, off pressors, doing well.    Last Vitals:  Vitals:   07/12/18 0600 07/12/18 0700  BP: (!) 110/52 (!) 103/48  Pulse: 85 81  Resp: 20 18  Temp: 37.4 C 37.4 C  SpO2: 98% 98%    Last Pain:  Vitals:   07/12/18 0430  TempSrc:   PainSc: [place]

## 2018-07-12 NOTE — Progress Notes (Signed)
1 Day Post-Op Procedure(s) (LRB): CORONARY ARTERY BYPASS GRAFTING (CABG) x three, using left internal mammary artery and right leg greater saphenous vein harvested endoscopically (N/A) TRANSESOPHAGEAL ECHOCARDIOGRAM (TEE) (N/A) Subjective: No complaints   Objective: Vital signs in last 24 hours: Temp:  [94.6 F (34.8 C)-99.3 F (37.4 C)] 99.3 F (37.4 C) (11/08 0700) Pulse Rate:  [79-95] 81 (11/08 0700) Cardiac Rhythm: Normal sinus rhythm (11/08 0400) Resp:  [0-32] 18 (11/08 0700) BP: (94-119)/(48-67) 103/48 (11/08 0700) SpO2:  [96 %-100 %] 98 % (11/08 0700) Arterial Line BP: (113-155)/(37-65) 124/40 (11/08 0700) FiO2 (%):  [40 %-50 %] 40 % (11/07 1714) Weight:  [73.6 kg] 73.6 kg (11/08 0500)  Hemodynamic parameters for last 24 hours: PAP: (19-36)/(9-21) 24/15 CO:  [3.3 L/min-7.2 L/min] 6.8 L/min CI:  [1.8 L/min/m2-4 L/min/m2] 3.8 L/min/m2  Intake/Output from previous day: 11/07 0701 - 11/08 0700 In: 4129.6 [P.O.:200; I.V.:2983.8; Blood:360; IV Piggyback:585.8] Out: 4240 [Urine:2760; Emesis/NG output:10; Blood:800; Chest Tube:670] Intake/Output this shift: No intake/output data recorded.  General appearance: alert and cooperative Neurologic: intact Heart: regular rate and rhythm, S1, S2 normal, no murmur, click, rub or gallop Lungs: clear to auscultation bilaterally Extremities: edema mild Wound: dressings dry  Lab Results: Recent Labs    07/11/18 1848 07/11/18 1907 07/12/18 0422  WBC 18.1*  --  16.2*  HGB 8.8* 7.5* 8.1*  HCT 27.8* 22.0* 25.0*  PLT 146*  --  136*   BMET:  Recent Labs    07/10/18 1141  07/11/18 1907 07/12/18 0422  NA 136   < > 139 137  K 4.2   < > 4.1 4.4  CL 108   < > 113* 111  CO2 18*  --   --  22  GLUCOSE 97   < > 133* 121*  BUN 26*   < > 15 17  CREATININE 1.27*   < > 0.80 1.08  CALCIUM 8.8*  --   --  8.3*   < > = values in this interval not displayed.    PT/INR:  Recent Labs    07/11/18 1316  LABPROT 15.7*  INR 1.27    ABG    Component Value Date/Time   PHART 7.342 (L) 07/11/2018 1902   HCO3 17.3 (L) 07/11/2018 1902   TCO2 19 (L) 07/11/2018 1907   ACIDBASEDEF 8.0 (H) 07/11/2018 1902   O2SAT 97.0 07/11/2018 1902   CBG (last 3)  Recent Labs    07/11/18 1804 07/11/18 2331 07/12/18 0331  GLUCAP 122* 130* 104*   CXR: left basilar atelectasis  ECG: sinus, early repol  Assessment/Plan: S/P Procedure(s) (LRB): CORONARY ARTERY BYPASS GRAFTING (CABG) x three, using left internal mammary artery and right leg greater saphenous vein harvested endoscopically (N/A) TRANSESOPHAGEAL ECHOCARDIOGRAM (TEE) (N/A) POD 1 Hemodynamically stable in sinus rhythm. Continue low dose Lopressor. Normal LVEF. Mobilize Diuresis: wt up 10 lbs Expected acute postop blood loss anemia: preop Hgb 12.5. Will start iron and follow. d/c tubes/lines Continue foley due to diuresing patient and patient in ICU See progression orders   LOS: 1 day    Gaye Pollack 07/12/2018

## 2018-07-13 ENCOUNTER — Inpatient Hospital Stay (HOSPITAL_COMMUNITY): Payer: Medicare Other

## 2018-07-13 LAB — BASIC METABOLIC PANEL
ANION GAP: 8 (ref 5–15)
BUN: 18 mg/dL (ref 8–23)
CALCIUM: 8.2 mg/dL — AB (ref 8.9–10.3)
CO2: 23 mmol/L (ref 22–32)
Chloride: 103 mmol/L (ref 98–111)
Creatinine, Ser: 1.34 mg/dL — ABNORMAL HIGH (ref 0.61–1.24)
GFR calc Af Amer: 56 mL/min — ABNORMAL LOW (ref >60)
GFR, EST NON AFRICAN AMERICAN: 49 mL/min — AB (ref >60)
GLUCOSE: 137 mg/dL — AB (ref 70–99)
Potassium: 4 mmol/L (ref 3.5–5.1)
SODIUM: 134 mmol/L — AB (ref 135–145)

## 2018-07-13 LAB — CBC
HCT: 29.8 % — ABNORMAL LOW (ref 39.0–52.0)
Hemoglobin: 9.8 g/dL — ABNORMAL LOW (ref 13.0–17.0)
MCH: 31.5 pg (ref 26.0–34.0)
MCHC: 32.9 g/dL (ref 30.0–36.0)
MCV: 95.8 fL (ref 80.0–100.0)
PLATELETS: 143 10*3/uL — AB (ref 150–400)
RBC: 3.11 MIL/uL — AB (ref 4.22–5.81)
RDW: 14.8 % (ref 11.5–15.5)
WBC: 17 10*3/uL — AB (ref 4.0–10.5)
nRBC: 0 % (ref 0.0–0.2)

## 2018-07-13 LAB — GLUCOSE, CAPILLARY: Glucose-Capillary: 125 mg/dL — ABNORMAL HIGH (ref 70–99)

## 2018-07-13 MED ORDER — METOCLOPRAMIDE HCL 5 MG/ML IJ SOLN
10.0000 mg | Freq: Four times a day (QID) | INTRAMUSCULAR | Status: AC
  Administered 2018-07-13 – 2018-07-15 (×8): 10 mg via INTRAVENOUS
  Filled 2018-07-13 (×8): qty 2

## 2018-07-13 NOTE — Progress Notes (Signed)
      [address]       [place] [address]             [phone]      POD # 2  No new issues BP (!) 143/65   Pulse 76   Temp 98.1 F (36.7 C) (Oral)   Resp 17   Ht 5' 7.5" (1.715 m)   Wt 74.9 kg   SpO2 98%   BMI 25.48 kg/m   Intake/Output Summary (Last 24 hours) at 07/13/2018 1801 Last data filed at 07/13/2018 1700 Gross per 24 hour  Intake 1354.12 ml  Output 1230 ml  Net 124.12 ml   Ambulating well  Remo Lipps C. Roxan Hockey, MD [facility] [phone]

## 2018-07-13 NOTE — Progress Notes (Signed)
2 Days Post-Op Procedure(s) (LRB): CORONARY ARTERY BYPASS GRAFTING (CABG) x three, using left internal mammary artery and right leg greater saphenous vein harvested endoscopically (N/A) TRANSESOPHAGEAL ECHOCARDIOGRAM (TEE) (N/A) Subjective: C/o abdominal distention, nausea  Objective: Vital signs in last 24 hours: Temp:  [97.7 F (36.5 C)-99.1 F (37.3 C)] 97.7 F (36.5 C) (11/09 0749) Pulse Rate:  [79-133] 120 (11/09 0700) Cardiac Rhythm: Atrial fibrillation (11/09 0500) Resp:  [13-26] 13 (11/09 0700) BP: (80-121)/(47-77) 90/60 (11/09 0700) SpO2:  [91 %-98 %] 94 % (11/09 0700) Weight:  [74.9 kg] 74.9 kg (11/09 0600)  Hemodynamic parameters for last 24 hours:    Intake/Output from previous day: 11/08 0701 - 11/09 0700 In: 1606.4 [P.O.:440; I.V.:968.4; IV Piggyback:198] Out: 2185 [Urine:1455; Chest Tube:730] Intake/Output this shift: No intake/output data recorded.  General appearance: alert, cooperative and mild distress Neurologic: intact Heart: regular rate and rhythm Lungs: diminished breath sounds bibasilar Abdomen: distended, hypoactive BS, nontender  Lab Results: Recent Labs    07/12/18 1617 07/12/18 1623 07/13/18 0446  WBC 17.0*  --  17.0*  HGB 8.7* 8.2* 9.8*  HCT 26.9* 24.0* 29.8*  PLT 141*  --  143*   BMET:  Recent Labs    07/12/18 0422  07/12/18 1623 07/13/18 0446  NA 137  --  136 134*  K 4.4  --  4.1 4.0  CL 111  --  102 103  CO2 22  --   --  23  GLUCOSE 121*  --  136* 137*  BUN 17  --  18 18  CREATININE 1.08   < > 1.30* 1.34*  CALCIUM 8.3*  --   --  8.2*   < > = values in this interval not displayed.    PT/INR:  Recent Labs    07/11/18 1316  LABPROT 15.7*  INR 1.27   ABG    Component Value Date/Time   PHART 7.342 (L) 07/11/2018 1902   HCO3 17.3 (L) 07/11/2018 1902   TCO2 25 07/12/2018 1623   ACIDBASEDEF 8.0 (H) 07/11/2018 1902   O2SAT 97.0 07/11/2018 1902   CBG (last 3)  Recent Labs    07/12/18 0801 07/12/18 1148  07/12/18 1549  GLUCAP 113* 111* 128*    Assessment/Plan: S/P Procedure(s) (LRB): CORONARY ARTERY BYPASS GRAFTING (CABG) x three, using left internal mammary artery and right leg greater saphenous vein harvested endoscopically (N/A) TRANSESOPHAGEAL ECHOCARDIOGRAM (TEE) (N/A) -  CV- rapid atrial fib yesterday, converted to SR with IV amiodarone  Continue amiodarone IV today due to GI issues RESP- IS for basilar atelectasis RENAL- creatinine mildly elevated, stable   Will hold off on additional diuresis until GI improves GI- nausea, distended with hypoactive BS c/w ileus  Will add Reglan SCD for DVT prophylaxis Ambulate as tolerated   LOS: 2 days    Ryan Pittman 07/13/2018

## 2018-07-14 ENCOUNTER — Inpatient Hospital Stay (HOSPITAL_COMMUNITY): Payer: Medicare Other

## 2018-07-14 LAB — COOXEMETRY PANEL
Carboxyhemoglobin: 1 % (ref 0.5–1.5)
Methemoglobin: 1.7 % — ABNORMAL HIGH (ref 0.0–1.5)
O2 Saturation: 56.3 %
Total hemoglobin: 10.5 g/dL — ABNORMAL LOW (ref 12.0–16.0)

## 2018-07-14 LAB — COMPREHENSIVE METABOLIC PANEL
ALBUMIN: 2.9 g/dL — AB (ref 3.5–5.0)
ALT: 20 U/L (ref 0–44)
AST: 23 U/L (ref 15–41)
Alkaline Phosphatase: 40 U/L (ref 38–126)
Anion gap: 7 (ref 5–15)
BILIRUBIN TOTAL: 0.8 mg/dL (ref 0.3–1.2)
BUN: 24 mg/dL — ABNORMAL HIGH (ref 8–23)
CO2: 24 mmol/L (ref 22–32)
CREATININE: 1.71 mg/dL — AB (ref 0.61–1.24)
Calcium: 8.5 mg/dL — ABNORMAL LOW (ref 8.9–10.3)
Chloride: 104 mmol/L (ref 98–111)
GFR calc non Af Amer: 36 mL/min — ABNORMAL LOW (ref >60)
GFR, EST AFRICAN AMERICAN: 42 mL/min — AB (ref >60)
GLUCOSE: 142 mg/dL — AB (ref 70–99)
Potassium: 4.2 mmol/L (ref 3.5–5.1)
SODIUM: 135 mmol/L (ref 135–145)
TOTAL PROTEIN: 5.8 g/dL — AB (ref 6.5–8.1)

## 2018-07-14 LAB — CBC
HEMATOCRIT: 28.4 % — AB (ref 39.0–52.0)
HEMOGLOBIN: 8.8 g/dL — AB (ref 13.0–17.0)
MCH: 30 pg (ref 26.0–34.0)
MCHC: 31 g/dL (ref 30.0–36.0)
MCV: 96.9 fL (ref 80.0–100.0)
Platelets: 184 10*3/uL (ref 150–400)
RBC: 2.93 MIL/uL — AB (ref 4.22–5.81)
RDW: 14.7 % (ref 11.5–15.5)
WBC: 19.8 10*3/uL — ABNORMAL HIGH (ref 4.0–10.5)
nRBC: 0 % (ref 0.0–0.2)

## 2018-07-14 MED ORDER — METOPROLOL TARTRATE 25 MG/10 ML ORAL SUSPENSION: 25.0000 mg | Freq: Two times a day (BID) | ORAL | Status: DC

## 2018-07-14 MED ORDER — SODIUM CHLORIDE 0.9 % IV SOLN
INTRAVENOUS | Status: AC
  Administered 2018-07-14: 19:00:00 via INTRAVENOUS

## 2018-07-14 MED ORDER — METOPROLOL TARTRATE 5 MG/5ML IV SOLN: 5.0000 mg | Freq: Four times a day (QID) | INTRAVENOUS | Status: DC

## 2018-07-14 MED ORDER — LEVOTHYROXINE SODIUM 75 MCG PO TABS
37.5000 ug | ORAL_TABLET | ORAL | Status: DC
  Administered 2018-07-18: 37.5 ug via ORAL
  Filled 2018-07-14: qty 1

## 2018-07-14 MED ORDER — TRAZODONE HCL 50 MG PO TABS
50.0000 mg | ORAL_TABLET | Freq: Every evening | ORAL | Status: DC | PRN
  Administered 2018-07-14 – 2018-07-17 (×4): 50 mg via ORAL
  Filled 2018-07-14 (×4): qty 1

## 2018-07-14 MED ORDER — LEVOTHYROXINE SODIUM 75 MCG PO TABS
75.0000 ug | ORAL_TABLET | Freq: Every day | ORAL | Status: DC
  Administered 2018-07-15 – 2018-07-17 (×3): 75 ug via ORAL
  Filled 2018-07-14 (×4): qty 1

## 2018-07-14 MED ORDER — NAPHAZOLINE-GLYCERIN 0.012-0.2 % OP SOLN
1.0000 [drp] | Freq: Four times a day (QID) | OPHTHALMIC | Status: DC | PRN
  Filled 2018-07-14: qty 15

## 2018-07-14 MED ORDER — AMIODARONE IV BOLUS ONLY 150 MG/100ML
150.0000 mg | Freq: Once | INTRAVENOUS | Status: AC
  Administered 2018-07-14: 150 mg via INTRAVENOUS

## 2018-07-14 MED ORDER — HYDROXYZINE HCL 10 MG PO TABS
5.0000 mg | ORAL_TABLET | Freq: Three times a day (TID) | ORAL | Status: DC | PRN
  Administered 2018-07-14: 10 mg via ORAL
  Filled 2018-07-14 (×2): qty 1

## 2018-07-14 MED ORDER — METOPROLOL TARTRATE 5 MG/5ML IV SOLN
5.0000 mg | Freq: Four times a day (QID) | INTRAVENOUS | Status: DC
  Administered 2018-07-14 – 2018-07-15 (×2): 5 mg via INTRAVENOUS
  Filled 2018-07-14 (×2): qty 5

## 2018-07-14 MED ORDER — HYDROXYZINE HCL 10 MG PO TABS: 5.0000 mg | ORAL_TABLET | Freq: Three times a day (TID) | ORAL | Status: DC | PRN

## 2018-07-14 MED ORDER — METOPROLOL TARTRATE 25 MG PO TABS: 25.0000 mg | ORAL_TABLET | Freq: Two times a day (BID) | ORAL | Status: DC

## 2018-07-14 NOTE — Progress Notes (Signed)
3 Days Post-Op Procedure(s) (LRB): CORONARY ARTERY BYPASS GRAFTING (CABG) x three, using left internal mammary artery and right leg greater saphenous vein harvested endoscopically (N/A) TRANSESOPHAGEAL ECHOCARDIOGRAM (TEE) (N/A) Subjective: C/o abdominal distention, anxiety  Objective: Vital signs in last 24 hours: Temp:  [98.1 F (36.7 C)-98.6 F (37 C)] 98.3 F (36.8 C) (11/10 0738) Pulse Rate:  [69-128] 127 (11/10 0900) Cardiac Rhythm: Atrial fibrillation (11/10 0800) Resp:  [9-23] 19 (11/10 0900) BP: (94-153)/(48-106) 135/84 (11/10 0900) SpO2:  [94 %-100 %] 97 % (11/10 0900) Weight:  [75.2 kg] 75.2 kg (11/10 0300)  Hemodynamic parameters for last 24 hours:    Intake/Output from previous day: 11/09 0701 - 11/10 0700 In: 1319.9 [P.O.:440; I.V.:879.9] Out: 1090 [Urine:1090] Intake/Output this shift: Total I/O In: 73.3 [I.V.:73.3] Out: -   General appearance: alert, cooperative and no distress Neurologic: intact Heart: irregularly irregular rhythm Lungs: diminished breath sounds bibasilar Abdomen: distended, tympanitic, minimal BS, nontender  Lab Results: Recent Labs    07/13/18 0446 07/14/18 0346  WBC 17.0* 19.8*  HGB 9.8* 8.8*  HCT 29.8* 28.4*  PLT 143* 184   BMET:  Recent Labs    07/13/18 0446 07/14/18 0346  NA 134* 135  K 4.0 4.2  CL 103 104  CO2 23 24  GLUCOSE 137* 142*  BUN 18 24*  CREATININE 1.34* 1.71*  CALCIUM 8.2* 8.5*    PT/INR:  Recent Labs    07/11/18 1316  LABPROT 15.7*  INR 1.27   ABG    Component Value Date/Time   PHART 7.342 (L) 07/11/2018 1902   HCO3 17.3 (L) 07/11/2018 1902   TCO2 25 07/12/2018 1623   ACIDBASEDEF 8.0 (H) 07/11/2018 1902   O2SAT 97.0 07/11/2018 1902   CBG (last 3)  Recent Labs    07/12/18 1148 07/12/18 1549 07/13/18 2132  GLUCAP 111* 128* 125*    Assessment/Plan: S/P Procedure(s) (LRB): CORONARY ARTERY BYPASS GRAFTING (CABG) x three, using left internal mammary artery and right leg greater  saphenous vein harvested endoscopically (N/A) TRANSESOPHAGEAL ECHOCARDIOGRAM (TEE) (N/A) -CV- back in AF with RVR- amiodarone bolus, increase metoprolol  Check co-ox RESP_ continue IS RENAL- creatinine up to 1.7 in setting of ileus ENDO- CBG well controlled  Resume synthroid GI- abdomen distended, no pain or tenderness- c/w ileus  Continue Reglan, check KUB Leukocytosis- WBC elevated, no fevers, may be a SIRS reaction to CPB, follow  LOS: 3 days    Melrose Nakayama 07/14/2018

## 2018-07-14 NOTE — Progress Notes (Signed)
      [address]       [place] [address]             [phone]      Feels a little better this evening Says he has had some flatus and feels less distended BP 124/64   Pulse (!) 122   Temp 98.4 F (36.9 C) (Oral)   Resp (!) 24   Ht 5' 7.5" (1.715 m)   Wt 75.2 kg   SpO2 99%   BMI 25.58 kg/m   Intake/Output Summary (Last 24 hours) at 07/14/2018 1811 Last data filed at 07/14/2018 1600 Gross per 24 hour  Intake 820.88 ml  Output 1215 ml  Net -394.12 ml  KUB showed ileus Does have some BS this evening Still in atrial fib with RVR Will give another bolus of IV amio, change lopressor to IV as it is likely not being absorbed well 500 ml bolus of saline  Remo Lipps C. Roxan Hockey, MD [facility] [phone]

## 2018-07-14 NOTE — Progress Notes (Signed)
Results of abd xray noted and called to Dr. Roxan Hockey.  Dr. Roxan Hockey also aware of pt c/o abdominal discomfort with afib 120's noted on bedside EKG.  Will continue to closely monitor.

## 2018-07-14 NOTE — Progress Notes (Signed)
May have clear liquids per Dr. Roxan Hockey.

## 2018-07-14 NOTE — Plan of Care (Signed)
  Problem: Education: Goal: Will demonstrate proper wound care and an understanding of methods to prevent future damage Outcome: Progressing Goal: Knowledge of disease or condition will improve Outcome: Progressing Goal: Knowledge of the prescribed therapeutic regimen will improve Outcome: Progressing   Problem: Respiratory: Goal: Respiratory status will improve Outcome: Progressing   Problem: Skin Integrity: Goal: Wound healing without signs and symptoms of infection Outcome: Progressing Goal: Risk for impaired skin integrity will decrease Outcome: Progressing   Problem: Urinary Elimination: Goal: Ability to achieve and maintain adequate renal perfusion and functioning will improve Outcome: Progressing   Problem: Cardiac: Goal: Will achieve and/or maintain hemodynamic stability Outcome: Not Progressing Note:  Patient remaining in atrial fib with a heart rate in the 110s to 120s. Physician aware.

## 2018-07-15 ENCOUNTER — Inpatient Hospital Stay (HOSPITAL_COMMUNITY): Payer: Medicare Other

## 2018-07-15 LAB — COMPREHENSIVE METABOLIC PANEL
ALT: 16 U/L (ref 0–44)
AST: 17 U/L (ref 15–41)
Albumin: 2.5 g/dL — ABNORMAL LOW (ref 3.5–5.0)
Alkaline Phosphatase: 38 U/L (ref 38–126)
Anion gap: 5 (ref 5–15)
BUN: 20 mg/dL (ref 8–23)
CHLORIDE: 108 mmol/L (ref 98–111)
CO2: 25 mmol/L (ref 22–32)
Calcium: 8.1 mg/dL — ABNORMAL LOW (ref 8.9–10.3)
Creatinine, Ser: 1.39 mg/dL — ABNORMAL HIGH (ref 0.61–1.24)
GFR calc Af Amer: 54 mL/min — ABNORMAL LOW (ref >60)
GFR, EST NON AFRICAN AMERICAN: 47 mL/min — AB (ref >60)
Glucose, Bld: 135 mg/dL — ABNORMAL HIGH (ref 70–99)
Potassium: 3.5 mmol/L (ref 3.5–5.1)
SODIUM: 138 mmol/L (ref 135–145)
Total Bilirubin: 0.7 mg/dL (ref 0.3–1.2)
Total Protein: 5.2 g/dL — ABNORMAL LOW (ref 6.5–8.1)

## 2018-07-15 LAB — CBC
HCT: 27.2 % — ABNORMAL LOW (ref 39.0–52.0)
Hemoglobin: 8.5 g/dL — ABNORMAL LOW (ref 13.0–17.0)
MCH: 30.5 pg (ref 26.0–34.0)
MCHC: 31.3 g/dL (ref 30.0–36.0)
MCV: 97.5 fL (ref 80.0–100.0)
NRBC: 0 % (ref 0.0–0.2)
PLATELETS: 226 10*3/uL (ref 150–400)
RBC: 2.79 MIL/uL — ABNORMAL LOW (ref 4.22–5.81)
RDW: 14.7 % (ref 11.5–15.5)
WBC: 18.7 10*3/uL — ABNORMAL HIGH (ref 4.0–10.5)

## 2018-07-15 MED ORDER — DIGOXIN 125 MCG PO TABS
0.1250 mg | ORAL_TABLET | Freq: Every day | ORAL | Status: DC
  Administered 2018-07-16: 0.125 mg via ORAL
  Filled 2018-07-15: qty 1

## 2018-07-15 MED ORDER — DIGOXIN 0.25 MG/ML IJ SOLN
0.2500 mg | Freq: Once | INTRAMUSCULAR | Status: AC
  Administered 2018-07-15: 0.25 mg via INTRAVENOUS
  Filled 2018-07-15: qty 2

## 2018-07-15 MED ORDER — DIGOXIN 0.25 MG/ML IJ SOLN
0.5000 mg | Freq: Once | INTRAMUSCULAR | Status: AC
  Administered 2018-07-15: 0.5 mg via INTRAVENOUS
  Filled 2018-07-15: qty 2

## 2018-07-15 MED ORDER — ENOXAPARIN SODIUM 40 MG/0.4ML ~~LOC~~ SOLN
40.0000 mg | SUBCUTANEOUS | Status: DC
  Administered 2018-07-15 – 2018-07-17 (×3): 40 mg via SUBCUTANEOUS
  Filled 2018-07-15 (×4): qty 0.4

## 2018-07-15 MED ORDER — CLOPIDOGREL BISULFATE 75 MG PO TABS
75.0000 mg | ORAL_TABLET | Freq: Every day | ORAL | Status: DC
  Administered 2018-07-15 – 2018-07-18 (×4): 75 mg via ORAL
  Filled 2018-07-15 (×4): qty 1

## 2018-07-15 MED ORDER — HYDROCOD POLST-CPM POLST ER 10-8 MG/5ML PO SUER
5.0000 mL | Freq: Two times a day (BID) | ORAL | Status: DC | PRN
  Administered 2018-07-15 (×2): 5 mL via ORAL
  Filled 2018-07-15 (×2): qty 5

## 2018-07-15 MED ORDER — POTASSIUM CHLORIDE 10 MEQ/50ML IV SOLN
10.0000 meq | INTRAVENOUS | Status: AC | PRN
  Administered 2018-07-15 (×3): 10 meq via INTRAVENOUS
  Filled 2018-07-15 (×3): qty 50

## 2018-07-15 NOTE — Progress Notes (Addendum)
Patient ID: TRESTON COKER, male   DOB: 1941-01-15, 77 y.o.   MRN: [id number] EVENING ROUNDS NOTE :     [address]       [place] [address]             [phone]                 4 Days Post-Op Procedure(s) (LRB): CORONARY ARTERY BYPASS GRAFTING (CABG) x three, using left internal mammary artery and right leg greater saphenous vein harvested endoscopically (N/A) TRANSESOPHAGEAL ECHOCARDIOGRAM (TEE) (N/A)  Total Length of Stay:  LOS: 4 days  BP 116/61   Pulse 88   Temp 98.2 F (36.8 C) (Oral)   Resp 20   Ht 5' 7.5" (1.715 m)   Wt 73.7 kg   SpO2 94%   BMI 25.07 kg/m   .Intake/Output      11/10 0701 - 11/11 0700 11/11 0701 - 11/12 0700   P.O. 100 240   I.V. (mL/kg) 578.2 (7.8) 196 (2.7)   IV Piggyback  129.5   Total Intake(mL/kg) 678.2 (9.2) 565.5 (7.7)   Urine (mL/kg/hr) 1350 (0.8) 125 (0.1)   Stool 0 0   Total Output 1350 125   Net -671.8 +440.5        Urine Occurrence 1 x 3 x   Stool Occurrence 1 x 1 x     . amiodarone 30 mg/hr (07/15/18 1800)  . lactated ringers    . lactated ringers 20 mL/hr at 07/14/18 1600     Lab Results  Component Value Date   WBC 18.7 (H) 07/15/2018   HGB 8.5 (L) 07/15/2018   HCT 27.2 (L) 07/15/2018   PLT 226 07/15/2018   GLUCOSE 135 (H) 07/15/2018   CHOL 203 (H) 03/27/2018   TRIG 195.0 (H) 03/27/2018   HDL 55.90 03/27/2018   LDLDIRECT 112.0 03/27/2018   LDLCALC 108 (H) 03/27/2018   ALT 16 07/15/2018   AST 17 07/15/2018   NA 138 07/15/2018   K 3.5 07/15/2018   CL 108 07/15/2018   CREATININE 1.39 (H) 07/15/2018   BUN 20 07/15/2018   CO2 25 07/15/2018   TSH 2.430 06/12/2018   INR 1.27 07/11/2018   HGBA1C 5.7 (H) 07/10/2018    Stable day Converted to sinus ileus better  Grace Isaac MD  Beeper [phone] Office [phone] 07/15/2018 6:29 PM

## 2018-07-15 NOTE — Progress Notes (Addendum)
4 Days Post-Op Procedure(s) (LRB): CORONARY ARTERY BYPASS GRAFTING (CABG) x three, using left internal mammary artery and right leg greater saphenous vein harvested endoscopically (N/A) TRANSESOPHAGEAL ECHOCARDIOGRAM (TEE) (N/A) Subjective: Complains of cough. Did not sleep much  Passing flatus and had several loose BM's overnight.  Objective: Vital signs in last 24 hours: Temp:  [97.2 F (36.2 C)-98.5 F (36.9 C)] 98.2 F (36.8 C) (11/11 0400) Pulse Rate:  [107-131] 125 (11/11 0630) Cardiac Rhythm: Atrial fibrillation (11/11 0400) Resp:  [13-31] 19 (11/11 0630) BP: (80-152)/(52-89) 101/68 (11/11 0630) SpO2:  [93 %-99 %] 94 % (11/11 0630) Weight:  [73.7 kg] 73.7 kg (11/11 0500)  Hemodynamic parameters for last 24 hours:    Intake/Output from previous day: 11/10 0701 - 11/11 0700 In: 678.2 [P.O.:100; I.V.:578.2] Out: 1350 [Urine:1350] Intake/Output this shift: No intake/output data recorded.  General appearance: alert and cooperative Neurologic: intact Heart: irregularly irregular rhythm Lungs: diminished breath sounds bibasilar Abdomen: soft, non-tender; bowel sounds normal; mildly distended Extremities: extremities normal, atraumatic, no cyanosis or edema Wound: incision ok  Lab Results: Recent Labs    07/14/18 0346 07/15/18 0251  WBC 19.8* 18.7*  HGB 8.8* 8.5*  HCT 28.4* 27.2*  PLT 184 226   BMET:  Recent Labs    07/14/18 0346 07/15/18 0251  NA 135 138  K 4.2 3.5  CL 104 108  CO2 24 25  GLUCOSE 142* 135*  BUN 24* 20  CREATININE 1.71* 1.39*  CALCIUM 8.5* 8.1*    PT/INR: No results for input(s): LABPROT, INR in the last 72 hours. ABG    Component Value Date/Time   PHART 7.342 (L) 07/11/2018 1902   HCO3 17.3 (L) 07/11/2018 1902   TCO2 25 07/12/2018 1623   ACIDBASEDEF 8.0 (H) 07/11/2018 1902   O2SAT 56.3 07/14/2018 1012   CBG (last 3)  Recent Labs    07/12/18 1148 07/12/18 1549 07/13/18 2132  GLUCAP 111* 128* 125*   CLINICAL DATA:   Follow-up coronary bypass grafting  EXAM: PORTABLE CHEST 1 VIEW  COMPARISON:  07/14/2018  FINDINGS: Cardiac shadow is within normal limits. Postsurgical changes are noted. The right jugular sheath is again noted and stable. Mild bibasilar atelectatic changes are again noted. No pneumothorax is seen. No acute bony abnormality is noted.  IMPRESSION: No acute abnormality noted.   Electronically Signed   By: Inez Catalina M.D.   On: 07/15/2018 07:28  Assessment/Plan: S/P Procedure(s) (LRB): CORONARY ARTERY BYPASS GRAFTING (CABG) x three, using left internal mammary artery and right leg greater saphenous vein harvested endoscopically (N/A) TRANSESOPHAGEAL ECHOCARDIOGRAM (TEE) (N/A)  POD 4  Remains in atrial fib with RVR 120-140 on IV amio. Lopressor does not really slow it down much and BP runs low normal which limits options. Will add digoxin for rate control. Ideally would like to put on Coumadin if he is staying in atrial fib but he has a history of GIB with anemia.  Postop ileus: improving. Bowels moving now and has active BS. Continue clear liquids and advance as tolerated.  Volume excess: wt is 10 lbs over preop but will hold off on diuresis. Volume is probably in gut from ileus.  Leukocytosis: may be due to ileus, inflammatory. Will follow.   Tussionex to help with cough. He does have some bibasilar atelectasis and small effusions on CXR.   Continue IS, ambulation.   LOS: 4 days    Gaye Pollack 07/15/2018

## 2018-07-16 LAB — CBC
HEMATOCRIT: 24.5 % — AB (ref 39.0–52.0)
HEMOGLOBIN: 8.1 g/dL — AB (ref 13.0–17.0)
MCH: 32 pg (ref 26.0–34.0)
MCHC: 33.1 g/dL (ref 30.0–36.0)
MCV: 96.8 fL (ref 80.0–100.0)
Platelets: 221 10*3/uL (ref 150–400)
RBC: 2.53 MIL/uL — ABNORMAL LOW (ref 4.22–5.81)
RDW: 14.9 % (ref 11.5–15.5)
WBC: 13.6 10*3/uL — AB (ref 4.0–10.5)
nRBC: 6 % — ABNORMAL HIGH (ref 0.0–0.2)

## 2018-07-16 LAB — BASIC METABOLIC PANEL
Anion gap: 7 (ref 5–15)
BUN: 14 mg/dL (ref 8–23)
CHLORIDE: 107 mmol/L (ref 98–111)
CO2: 24 mmol/L (ref 22–32)
Calcium: 8 mg/dL — ABNORMAL LOW (ref 8.9–10.3)
Creatinine, Ser: 1.19 mg/dL (ref 0.61–1.24)
GFR calc Af Amer: >60 mL/min (ref >60)
GFR calc non Af Amer: 56 mL/min — ABNORMAL LOW (ref >60)
GLUCOSE: 124 mg/dL — AB (ref 70–99)
POTASSIUM: 3.5 mmol/L (ref 3.5–5.1)
Sodium: 138 mmol/L (ref 135–145)

## 2018-07-16 MED ORDER — TRAMADOL HCL 50 MG PO TABS: 50.0000 mg | ORAL_TABLET | ORAL | Status: DC | PRN

## 2018-07-16 MED ORDER — PANTOPRAZOLE SODIUM 40 MG PO TBEC
40.0000 mg | DELAYED_RELEASE_TABLET | Freq: Every day | ORAL | Status: DC
  Administered 2018-07-17 – 2018-07-18 (×2): 40 mg via ORAL
  Filled 2018-07-16 (×2): qty 1

## 2018-07-16 MED ORDER — SODIUM CHLORIDE 0.9 % IV SOLN: 250.0000 mL | INTRAVENOUS | Status: DC | PRN

## 2018-07-16 MED ORDER — OXYCODONE HCL 5 MG PO TABS: 5.0000 mg | ORAL_TABLET | ORAL | Status: DC | PRN

## 2018-07-16 MED ORDER — ONDANSETRON HCL 4 MG/2ML IJ SOLN: 4.0000 mg | Freq: Four times a day (QID) | INTRAMUSCULAR | Status: DC | PRN

## 2018-07-16 MED ORDER — MOVING RIGHT ALONG BOOK
Freq: Once | Status: AC
  Administered 2018-07-16: 17:00:00
  Filled 2018-07-16 (×2): qty 1

## 2018-07-16 MED ORDER — POTASSIUM CHLORIDE 10 MEQ/50ML IV SOLN
10.0000 meq | INTRAVENOUS | Status: AC
  Administered 2018-07-16 (×3): 10 meq via INTRAVENOUS
  Filled 2018-07-16 (×3): qty 50

## 2018-07-16 MED ORDER — FUROSEMIDE 40 MG PO TABS
40.0000 mg | ORAL_TABLET | Freq: Every day | ORAL | Status: AC
  Administered 2018-07-16 – 2018-07-18 (×3): 40 mg via ORAL
  Filled 2018-07-16 (×3): qty 1

## 2018-07-16 MED ORDER — METOPROLOL TARTRATE 12.5 MG HALF TABLET
12.5000 mg | ORAL_TABLET | Freq: Two times a day (BID) | ORAL | Status: DC
  Administered 2018-07-16 – 2018-07-18 (×5): 12.5 mg via ORAL
  Filled 2018-07-16 (×5): qty 1

## 2018-07-16 MED ORDER — GUAIFENESIN ER 600 MG PO TB12
600.0000 mg | ORAL_TABLET | Freq: Two times a day (BID) | ORAL | Status: DC
  Administered 2018-07-16 – 2018-07-18 (×5): 600 mg via ORAL
  Filled 2018-07-16 (×5): qty 1

## 2018-07-16 MED ORDER — AMIODARONE HCL 200 MG PO TABS
400.0000 mg | ORAL_TABLET | Freq: Two times a day (BID) | ORAL | Status: DC
  Administered 2018-07-16 – 2018-07-18 (×5): 400 mg via ORAL
  Filled 2018-07-16 (×5): qty 2

## 2018-07-16 MED ORDER — POTASSIUM CHLORIDE CRYS ER 20 MEQ PO TBCR
20.0000 meq | EXTENDED_RELEASE_TABLET | Freq: Two times a day (BID) | ORAL | Status: DC
  Administered 2018-07-16 – 2018-07-18 (×5): 20 meq via ORAL
  Filled 2018-07-16 (×5): qty 1

## 2018-07-16 MED ORDER — SODIUM CHLORIDE 0.9% FLUSH: 3.0000 mL | INTRAVENOUS | Status: DC | PRN

## 2018-07-16 MED ORDER — ACETAMINOPHEN 325 MG PO TABS: 650.0000 mg | ORAL_TABLET | Freq: Four times a day (QID) | ORAL | Status: DC | PRN

## 2018-07-16 MED ORDER — SODIUM CHLORIDE 0.9% FLUSH
3.0000 mL | Freq: Two times a day (BID) | INTRAVENOUS | Status: DC
  Administered 2018-07-16 – 2018-07-18 (×4): 3 mL via INTRAVENOUS

## 2018-07-16 MED ORDER — ONDANSETRON HCL 4 MG PO TABS
4.0000 mg | ORAL_TABLET | Freq: Four times a day (QID) | ORAL | Status: DC | PRN
  Administered 2018-07-18: 4 mg via ORAL
  Filled 2018-07-16: qty 1

## 2018-07-16 NOTE — Progress Notes (Signed)
5 Days Post-Op Procedure(s) (LRB): CORONARY ARTERY BYPASS GRAFTING (CABG) x three, using left internal mammary artery and right leg greater saphenous vein harvested endoscopically (N/A) TRANSESOPHAGEAL ECHOCARDIOGRAM (TEE) (N/A) Subjective: No complaints  Objective: Vital signs in last 24 hours: Temp:  [98.2 F (36.8 C)-98.8 F (37.1 C)] 98.3 F (36.8 C) (11/12 0402) Pulse Rate:  [81-129] 88 (11/12 0700) Cardiac Rhythm: Normal sinus rhythm (11/12 0400) Resp:  [11-26] 16 (11/12 0700) BP: (95-152)/(43-92) 134/62 (11/12 0700) SpO2:  [92 %-98 %] 95 % (11/12 0700) Weight:  [73.3 kg] 73.3 kg (11/12 0500)  Hemodynamic parameters for last 24 hours:    Intake/Output from previous day: 11/11 0701 - 11/12 0700 In: 1004.9 [P.O.:480; I.V.:395.4; IV Piggyback:129.5] Out: 1600 [Urine:1600] Intake/Output this shift: No intake/output data recorded.  General appearance: alert and cooperative Neurologic: intact Heart: regular rate and rhythm, S1, S2 normal, no murmur, click, rub or gallop Lungs: clear to auscultation bilaterally Extremities: extremities normal, atraumatic, no cyanosis or edema Wound: incisions ok  Lab Results: Recent Labs    07/15/18 0251 07/16/18 0522  WBC 18.7* 13.6*  HGB 8.5* 8.1*  HCT 27.2* 24.5*  PLT 226 221   BMET:  Recent Labs    07/15/18 0251 07/16/18 0522  NA 138 138  K 3.5 3.5  CL 108 107  CO2 25 24  GLUCOSE 135* 124*  BUN 20 14  CREATININE 1.39* 1.19  CALCIUM 8.1* 8.0*    PT/INR: No results for input(s): LABPROT, INR in the last 72 hours. ABG    Component Value Date/Time   PHART 7.342 (L) 07/11/2018 1902   HCO3 17.3 (L) 07/11/2018 1902   TCO2 25 07/12/2018 1623   ACIDBASEDEF 8.0 (H) 07/11/2018 1902   O2SAT 56.3 07/14/2018 1012   CBG (last 3)  Recent Labs    07/13/18 2132  GLUCAP 125*   ECG: sinus, QTc 466 ms  Assessment/Plan: S/P Procedure(s) (LRB): CORONARY ARTERY BYPASS GRAFTING (CABG) x three, using left internal mammary  artery and right leg greater saphenous vein harvested endoscopically (N/A) TRANSESOPHAGEAL ECHOCARDIOGRAM (TEE) (N/A)  POD 5 Hemodynamically stable  Postop atrial fibrillation: back in sinus on amio and digoxin. Will switch amio to po and continue digoxin. Check dig level tomorrow. BP is better today so will add low dose Lopressor.  Volume excess: wt is about 10 lbs over preop so will resume diuresis since creat back down.   Continue IS, ambulation  Transfer to 4E.   LOS: 5 days    Ryan Pittman 07/16/2018

## 2018-07-17 LAB — CBC
HCT: 24.6 % — ABNORMAL LOW (ref 39.0–52.0)
Hemoglobin: 8 g/dL — ABNORMAL LOW (ref 13.0–17.0)
MCH: 30.7 pg (ref 26.0–34.0)
MCHC: 32.5 g/dL (ref 30.0–36.0)
MCV: 94.3 fL (ref 80.0–100.0)
NRBC: 0.1 % (ref 0.0–0.2)
PLATELETS: 284 10*3/uL (ref 150–400)
RBC: 2.61 MIL/uL — AB (ref 4.22–5.81)
RDW: 14.6 % (ref 11.5–15.5)
WBC: 14.4 10*3/uL — ABNORMAL HIGH (ref 4.0–10.5)

## 2018-07-17 LAB — BASIC METABOLIC PANEL
ANION GAP: 6 (ref 5–15)
BUN: 17 mg/dL (ref 8–23)
CALCIUM: 8.7 mg/dL — AB (ref 8.9–10.3)
CO2: 25 mmol/L (ref 22–32)
Chloride: 106 mmol/L (ref 98–111)
Creatinine, Ser: 1.22 mg/dL (ref 0.61–1.24)
GFR calc non Af Amer: 55 mL/min — ABNORMAL LOW (ref >60)
Glucose, Bld: 113 mg/dL — ABNORMAL HIGH (ref 70–99)
Potassium: 4.3 mmol/L (ref 3.5–5.1)
Sodium: 137 mmol/L (ref 135–145)

## 2018-07-17 LAB — DIGOXIN LEVEL: Digoxin Level: 0.7 ng/mL — ABNORMAL LOW (ref 0.8–2.0)

## 2018-07-17 LAB — PATHOLOGIST SMEAR REVIEW

## 2018-07-17 MED ORDER — DIGOXIN 125 MCG PO TABS
0.2500 mg | ORAL_TABLET | Freq: Every day | ORAL | Status: DC
  Administered 2018-07-17 – 2018-07-18 (×2): 0.25 mg via ORAL
  Filled 2018-07-17 (×2): qty 2

## 2018-07-17 NOTE — Progress Notes (Signed)
CARDIAC REHAB PHASE I   PRE:  Rate/Rhythm: 90 SR  BP:  Sitting: 101/47      SaO2: 96 RA  MODE:  Ambulation: 500 ft 102 peak HR  POST:  Rate/Rhythm: 90 SR  BP:  Sitting: 146/58    SaO2: 97 RA   Pt ambulated 594ft in hallway standby assist with front wheel walker. Pt denied pain or SOB. Pt helped into bed, call bell and phone within reach. Encouraged pt to walk two more times today. Will continue to follow.  [phone] Rufina Falco, RN BSN 07/17/2018 10:24 AM

## 2018-07-17 NOTE — Progress Notes (Signed)
Removed epicardial wires per order. 4 intact.  Pt tolerated procedure well.  Pt instructed to remain on bedrest for one hour.  Frequent vitals will be taken and documented. Pt resting with call bell within reach. Payton Emerald, RN

## 2018-07-17 NOTE — Care Management Important Message (Signed)
Important Message  Patient Details  Name: Ryan Pittman MRN: [id number] Date of Birth: 14-Feb-1941   Medicare Important Message Given:  Yes    [name] 07/17/2018, 4:14 PM

## 2018-07-17 NOTE — Progress Notes (Addendum)
[address]       [place] [address]             [phone]      6 Days Post-Op Procedure(s) (LRB): CORONARY ARTERY BYPASS GRAFTING (CABG) x three, using left internal mammary artery and right leg greater saphenous vein harvested endoscopically (N/A) TRANSESOPHAGEAL ECHOCARDIOGRAM (TEE) (N/A) Subjective: Feels pretty well, some minor cough with clear sputum  Objective: Vital signs in last 24 hours: Temp:  [97.6 F (36.4 C)-99.3 F (37.4 C)] 99.3 F (37.4 C) (11/13 0410) Pulse Rate:  [73-101] 82 (11/13 0410) Cardiac Rhythm: Normal sinus rhythm (11/13 0701) Resp:  [14-22] 18 (11/13 0410) BP: (100-137)/(49-63) 136/54 (11/13 0410) SpO2:  [94 %-97 %] 96 % (11/13 0410) Weight:  [71.7 kg] 71.7 kg (11/13 0412)  Hemodynamic parameters for last 24 hours:    Intake/Output from previous day: 11/12 0701 - 11/13 0700 In: 524.2 [P.O.:322; I.V.:53.2; IV Piggyback:149.1] Out: 150 [Urine:150] Intake/Output this shift: No intake/output data recorded.  General appearance: alert, cooperative and no distress Heart: regular rate and rhythm Lungs: minor upper air ronchi, improves with cough Abdomen: soft, non tender Extremities: + LE edema Wound: incis healing well  Lab Results: Recent Labs    07/16/18 0522 07/17/18 0326  WBC 13.6* 14.4*  HGB 8.1* 8.0*  HCT 24.5* 24.6*  PLT 221 284   BMET:  Recent Labs    07/16/18 0522 07/17/18 0326  NA 138 137  K 3.5 4.3  CL 107 106  CO2 24 25  GLUCOSE 124* 113*  BUN 14 17  CREATININE 1.19 1.22  CALCIUM 8.0* 8.7*    PT/INR: No results for input(s): LABPROT, INR in the last 72 hours. ABG    Component Value Date/Time   PHART 7.342 (L) 07/11/2018 1902   HCO3 17.3 (L) 07/11/2018 1902   TCO2 25 07/12/2018 1623   ACIDBASEDEF 8.0 (H) 07/11/2018 1902   O2SAT 56.3 07/14/2018 1012   CBG (last 3)  No results for input(s): GLUCAP in the last 72 hours.  Meds Scheduled Meds: . amiodarone  400 mg Oral BID  .  clopidogrel  75 mg Oral Daily  . digoxin  0.125 mg Oral Daily  . enoxaparin (LOVENOX) injection  40 mg Subcutaneous Q24H  . ferrous [id number] C-folic acid  1 capsule Oral BID WC  . furosemide  40 mg Oral Daily  . guaiFENesin  600 mg Oral BID  . [START ON 07/18/2018] levothyroxine  37.5 mcg Oral Q Thu  . levothyroxine  75 mcg Oral QAC breakfast  . metoprolol tartrate  12.5 mg Oral BID  . pantoprazole  40 mg Oral QAC breakfast  . potassium chloride  20 mEq Oral BID  . pravastatin  40 mg Oral Daily  . sodium chloride flush  3 mL Intravenous Q12H   Continuous Infusions: . sodium chloride     PRN Meds:.sodium chloride, acetaminophen, chlorpheniramine-HYDROcodone, hydrOXYzine, naphazoline-glycerin, ondansetron **OR** ondansetron (ZOFRAN) IV, oxyCODONE, sodium chloride flush, traMADol, traZODone  Xrays No results found.  Assessment/Plan: S/P Procedure(s) (LRB): CORONARY ARTERY BYPASS GRAFTING (CABG) x three, using left internal mammary artery and right leg greater saphenous vein harvested endoscopically (N/A) TRANSESOPHAGEAL ECHOCARDIOGRAM (TEE) (N/A)  1 conts to progress well 2 maintaining sinus rhythm, dig level 0.7, will increase to .25 daily. Also on amio/metoprolol/ Most recent TSH 2.4 on 06/12/18 on supplement. Will need monitoring and hopefully can decrease amio dose soon(discharge), QTc 466 on yesterday 12 lead 3 mild volume overload, cont current diuretics  dosing. BUN/Creat in normal range with slightly reduced GFR(55) 4 leukocytosis fairly stable , no fevers- may have mild bronchitis- cont routine pulm toilet for now 5 ABL anemia stable, not at transfusion threshold and ambulating well. 6 BS control is good- HGA1C 5.7- monitor and cont nutritional management 7 d/c epw's today 8 poss home in am 9 cont routine cardiac rehab  LOS: 6 days    Ryan Pittman [facility] 07/17/2018 Pager [phone]   Chart reviewed, patient examined, agree with above. He feels  well Maintaining sinus rhythm. Would send home on amio 200 bid and digoxin 0.125. He can go home in am if no changes.

## 2018-07-18 ENCOUNTER — Ambulatory Visit (HOSPITAL_COMMUNITY): Payer: Medicare Other | Attending: Cardiology

## 2018-07-18 ENCOUNTER — Other Ambulatory Visit (HOSPITAL_COMMUNITY): Payer: Self-pay

## 2018-07-18 ENCOUNTER — Ambulatory Visit (HOSPITAL_COMMUNITY)
Admission: RE | Admit: 2018-07-18 | Payer: Medicare Other | Source: Ambulatory Visit | Attending: Cardiovascular Disease | Admitting: Cardiovascular Disease

## 2018-07-18 ENCOUNTER — Encounter (HOSPITAL_COMMUNITY): Payer: Medicare Other

## 2018-07-18 MED ORDER — OXYCODONE HCL 5 MG PO TABS: 5.0000 mg | ORAL_TABLET | Freq: Four times a day (QID) | ORAL | 0 refills | Status: DC | PRN

## 2018-07-18 MED ORDER — ACETAMINOPHEN 325 MG PO TABS: 650.0000 mg | ORAL_TABLET | Freq: Four times a day (QID) | ORAL | Status: DC | PRN

## 2018-07-18 MED ORDER — DIGOXIN 125 MCG PO TABS: 0.1250 mg | ORAL_TABLET | Freq: Every day | ORAL | 1 refills | Status: DC

## 2018-07-18 MED ORDER — METOPROLOL TARTRATE 25 MG PO TABS: 12.5000 mg | ORAL_TABLET | Freq: Two times a day (BID) | ORAL | 1 refills | Status: DC

## 2018-07-18 MED ORDER — AMIODARONE HCL 200 MG PO TABS: 200.0000 mg | ORAL_TABLET | Freq: Two times a day (BID) | ORAL | 1 refills | Status: DC

## 2018-07-18 NOTE — Care Management Note (Signed)
Case Management Note Marvetta Gibbons RN, BSN Transitions of Care Unit 4E- RN Case Manager [phone]  Patient Details  Name: Ryan Pittman MRN: [id number] Date of Birth: 08-14-1941  Subjective/Objective:  Pt admitted with CAD s/p CABGx3                  Action/Plan: PTA pt lived at home with wife, plan to return home no CM needs noted for transition home.   Expected Discharge Date:  07/18/18               Expected Discharge Plan:  Home/Self Care  In-House Referral:  NA  Discharge planning Services  CM Consult  Post Acute Care Choice:    Choice offered to:     DME Arranged:    DME Agency:     HH Arranged:    HH Agency:     Status of Service:  Completed, signed off  If discussed at [facility] of Stay Meetings, dates discussed:    Discharge Disposition: home/self care   Additional Comments:  Dawayne Patricia, RN 07/18/2018, 2:55 PM

## 2018-07-18 NOTE — Progress Notes (Signed)
      [address]       [place] [address]             [phone]      7 Days Post-Op Procedure(s) (LRB): CORONARY ARTERY BYPASS GRAFTING (CABG) x three, using left internal mammary artery and right leg greater saphenous vein harvested endoscopically (N/A) TRANSESOPHAGEAL ECHOCARDIOGRAM (TEE) (N/A) Subjective: Feels good this morning and is ready for home  Objective: Vital signs in last 24 hours: Temp:  [98.3 F (36.8 C)-98.7 F (37.1 C)] 98.7 F (37.1 C) (11/14 0422) Pulse Rate:  [74-83] 81 (11/13 1947) Cardiac Rhythm: Normal sinus rhythm (11/14 0422) Resp:  [16-26] 25 (11/13 1947) BP: (130-159)/(56-69) 146/66 (11/13 1947) SpO2:  [96 %] 96 % (11/13 1947) Weight:  [69.6 kg] 69.6 kg (11/14 0615)     Intake/Output from previous day: 11/13 0701 - 11/14 0700 In: 240 [P.O.:240] Out: 300 [Urine:300] Intake/Output this shift: No intake/output data recorded.  General appearance: alert, cooperative and no distress Heart: regular rate and rhythm, S1, S2 normal, no murmur, click, rub or gallop Lungs: clear to auscultation bilaterally Abdomen: soft, non-tender; bowel sounds normal; no masses,  no organomegaly Extremities: extremities normal, atraumatic, no cyanosis or edema Wound: clean and dry  Lab Results: Recent Labs    07/16/18 0522 07/17/18 0326  WBC 13.6* 14.4*  HGB 8.1* 8.0*  HCT 24.5* 24.6*  PLT 221 284   BMET:  Recent Labs    07/16/18 0522 07/17/18 0326  NA 138 137  K 3.5 4.3  CL 107 106  CO2 24 25  GLUCOSE 124* 113*  BUN 14 17  CREATININE 1.19 1.22  CALCIUM 8.0* 8.7*    PT/INR: No results for input(s): LABPROT, INR in the last 72 hours. ABG    Component Value Date/Time   PHART 7.342 (L) 07/11/2018 1902   HCO3 17.3 (L) 07/11/2018 1902   TCO2 25 07/12/2018 1623   ACIDBASEDEF 8.0 (H) 07/11/2018 1902   O2SAT 56.3 07/14/2018 1012   CBG (last 3)  No results for input(s): GLUCAP in the last 72 hours.  Assessment/Plan: S/P  Procedure(s) (LRB): CORONARY ARTERY BYPASS GRAFTING (CABG) x three, using left internal mammary artery and right leg greater saphenous vein harvested endoscopically (N/A) TRANSESOPHAGEAL ECHOCARDIOGRAM (TEE) (N/A)  1. CV- NSR, BP is well controlled. Continue Amio and Dig 2. Pulm-tolerating room air with excellent oxygen saturation. 3. Renal-creatinine 1.22, stable. Electrolytes okay 4. H and H 8.0/24.6, expected acute blood loss anemia 5. Endo-blood glucose well controlled 6. WBC 14.4 today, continue incentive spirometer  Plan: home today      LOS: 7 days    [name] 07/18/2018

## 2018-07-18 NOTE — Progress Notes (Signed)
CARDIAC REHAB PHASE I   Offered to walk with pt, pt declining stating he did not sleep well and is nauseous. D/c ed completed with pt and wife. Pt instructed to shower and monitor incisions daily. Encouraged continued IS use. Reinforced importance of sternal precautions. Pt given in-the-tube and heart healthy diet. Reviewed restrictions and exercise guidelines. Will refer to CRP II .   [id number] Rufina Falco, RN BSN 07/18/2018 9:59 AM

## 2018-07-18 NOTE — Progress Notes (Signed)
D/c instructions given to pt an wife. Wound care and sternal precautions reviewed. Prescriptions given. IV's removed, clean and intact. Wife to escort home.  Clyde Canterbury, RN

## 2018-07-19 ENCOUNTER — Telehealth: Payer: Self-pay

## 2018-07-19 LAB — BPAM RBC
BLOOD PRODUCT EXPIRATION DATE: 201912052359
Blood Product Expiration Date: 201912052359
ISSUE DATE / TIME: 201911071132
ISSUE DATE / TIME: 201911071132
UNIT TYPE AND RH: 600
Unit Type and Rh: 600

## 2018-07-19 LAB — TYPE AND SCREEN
ABO/RH(D): A NEG
Antibody Screen: NEGATIVE
UNIT DIVISION: 0
Unit division: 0

## 2018-07-19 NOTE — Telephone Encounter (Signed)
Patient discharged from [facility] after 7 day hospitalization. Admitting dx: CAD. S/P CABG x5.  Attempted to reach patient. Attempt unsuccessful. Left message on home phone. Per discharge summary, patient is scheduled for f/u with Cardiology.

## 2018-07-21 NOTE — Telephone Encounter (Signed)
Thanks for calling patient.  Per EMR, he has f/u with cards and vascular surgery pending.

## 2018-07-23 ENCOUNTER — Ambulatory Visit (INDEPENDENT_AMBULATORY_CARE_PROVIDER_SITE_OTHER): Payer: Medicare Other | Admitting: Vascular Surgery

## 2018-07-23 ENCOUNTER — Encounter (INDEPENDENT_AMBULATORY_CARE_PROVIDER_SITE_OTHER): Payer: Medicare Other

## 2018-07-24 NOTE — Telephone Encounter (Signed)
Per EMR, he has f/u pending for tomorrow with cardiothoracic surgery.

## 2018-07-25 ENCOUNTER — Ambulatory Visit (INDEPENDENT_AMBULATORY_CARE_PROVIDER_SITE_OTHER): Payer: Self-pay

## 2018-07-25 DIAGNOSIS — Z4802 Encounter for removal of sutures: Secondary | ICD-10-CM

## 2018-08-05 ENCOUNTER — Ambulatory Visit (HOSPITAL_COMMUNITY)
Admission: RE | Admit: 2018-08-05 | Discharge: 2018-08-05 | Disposition: A | Discharge status: Home / Self Care | Payer: Medicare Other | Source: Ambulatory Visit | Attending: Cardiology | Admitting: Cardiology

## 2018-08-05 ENCOUNTER — Ambulatory Visit (HOSPITAL_BASED_OUTPATIENT_CLINIC_OR_DEPARTMENT_OTHER)
Admission: RE | Admit: 2018-08-05 | Discharge: 2018-08-05 | Disposition: A | Discharge status: Home / Self Care | Payer: Medicare Other | Source: Ambulatory Visit | Attending: Cardiovascular Disease | Admitting: Cardiovascular Disease

## 2018-08-05 DIAGNOSIS — I150 Renovascular hypertension: Secondary | ICD-10-CM | POA: Insufficient documentation

## 2018-08-05 DIAGNOSIS — I701 Atherosclerosis of renal artery: Secondary | ICD-10-CM

## 2018-08-05 DIAGNOSIS — Z95820 Peripheral vascular angioplasty status with implants and grafts: Secondary | ICD-10-CM

## 2018-08-05 DIAGNOSIS — I739 Peripheral vascular disease, unspecified: Secondary | ICD-10-CM

## 2018-08-06 ENCOUNTER — Ambulatory Visit (INDEPENDENT_AMBULATORY_CARE_PROVIDER_SITE_OTHER): Payer: Medicare Other | Admitting: Cardiology

## 2018-08-06 ENCOUNTER — Encounter: Payer: Self-pay | Admitting: Cardiology

## 2018-08-06 VITALS — BP 124/46 | HR 49 | Ht 67.0 in | Wt 152.0 lb

## 2018-08-06 DIAGNOSIS — I480 Paroxysmal atrial fibrillation: Secondary | ICD-10-CM

## 2018-08-06 DIAGNOSIS — Z951 Presence of aortocoronary bypass graft: Secondary | ICD-10-CM | POA: Diagnosis not present

## 2018-08-06 DIAGNOSIS — I200 Unstable angina: Secondary | ICD-10-CM

## 2018-08-06 DIAGNOSIS — N183 Chronic kidney disease, stage 3 unspecified: Secondary | ICD-10-CM

## 2018-08-06 DIAGNOSIS — I701 Atherosclerosis of renal artery: Secondary | ICD-10-CM | POA: Diagnosis not present

## 2018-08-06 LAB — CBC
Hematocrit: 30 % — ABNORMAL LOW (ref 37.5–51.0)
Hemoglobin: 9.9 g/dL — ABNORMAL LOW (ref 13.0–17.7)
MCH: 30.4 pg (ref 26.6–33.0)
MCHC: 33 g/dL (ref 31.5–35.7)
MCV: 92 fL (ref 79–97)
Platelets: 265 10*3/uL (ref 150–450)
RBC: 3.26 x10E6/uL — ABNORMAL LOW (ref 4.14–5.80)
RDW: 14.2 % (ref 12.3–15.4)
WBC: 13 10*3/uL — ABNORMAL HIGH (ref 3.4–10.8)

## 2018-08-06 LAB — BASIC METABOLIC PANEL
BUN/Creatinine Ratio: 16 (ref 10–24)
BUN: 25 mg/dL (ref 8–27)
CO2: 23 mmol/L (ref 20–29)
Calcium: 9.3 mg/dL (ref 8.6–10.2)
Chloride: 105 mmol/L (ref 96–106)
Creatinine, Ser: 1.53 mg/dL — ABNORMAL HIGH (ref 0.76–1.27)
GFR calc Af Amer: 49 mL/min/{1.73_m2} — ABNORMAL LOW (ref >59)
GFR calc non Af Amer: 43 mL/min/{1.73_m2} — ABNORMAL LOW (ref >59)
Glucose: 88 mg/dL (ref 65–99)
Potassium: 5.2 mmol/L (ref 3.5–5.2)
Sodium: 140 mmol/L (ref 134–144)

## 2018-08-06 MED ORDER — AMIODARONE HCL 200 MG PO TABS: 200.0000 mg | ORAL_TABLET | Freq: Every day | ORAL | 1 refills | Status: DC

## 2018-08-06 NOTE — Assessment & Plan Note (Addendum)
Check BMP and CBC today

## 2018-08-06 NOTE — Assessment & Plan Note (Signed)
CABG x5 07/11/18- LIMA-LAD, SVG-OM1-OM2-OM3, SVG-RCA

## 2018-08-06 NOTE — Assessment & Plan Note (Signed)
Post CABG- Amiodarone- not anticoagulated Bradycardic today, Amiodarone decreased, Lanoxin stopped

## 2018-08-06 NOTE — Progress Notes (Signed)
08/06/2018 Ryan Pittman   [birth year]  [id number]  Primary Physician Tonia Ghent, MD Primary Cardiologist: Dr Gwenlyn Found  HPI:  Pleasant 77 y.o.male referred to Dr Gwenlyn Found 04/09/2018 by Dr. Elsie Stain for cardiovascular evaluation because of chest pain. His risk factors include 50 pack years of tobacco abuse having quit 8 years ago as well as treated hypertension and hyperlipidemia. There is no family history. He is never had a heart attack or stroke.  He does have a history of PAD status post left femoropopliteal bypass grafting several years ago by Dr. Sherron Flemings.  Routine exercise treadmill testing on him 04/17/2018 was significantly positive.  This led to a coronary CTA on 05/30/2018 revealing a coronary calcium score of 2858 with heavy calcification in all 3 coronary arteries. The pt admitted for diagnostic catheterization 06/11/2018.  This revealed three-vessel coronary disease.  He was seen in consult by Dr. Caffie Pinto and underwent bypass grafting x5 with an LIMA to the LAD, sequential SVG to the OM1 and OM 2 and OM 3, and SVG to the distal RCA.  His LV function was normal.  He did have postoperative PAF and required amiodarone Lanoxin and metoprolol.  He is in the office today for follow-up.  He is done pretty well since discharge although he admits he is significantly fatigued.  His heart rate is 48 in sinus.  He has not had syncope.  Is able a little trouble sleeping but I told him this was not unusual.  He has not had tachycardia.    Current Outpatient Medications  Medication Sig Dispense Refill  . acetaminophen (TYLENOL) 325 MG tablet Take 2 tablets (650 mg total) by mouth every 6 (six) hours as needed for mild pain.    [name] amiodarone (PACERONE) 200 MG tablet Take 1 tablet (200 mg total) by mouth daily. 60 tablet 1  . clopidogrel (PLAVIX) 75 MG tablet Take 75 mg by mouth daily.     . hydrOXYzine (ATARAX/VISTARIL) 10 MG tablet Take 0.5-1 tablets (5-10 mg total) by mouth 3  (three) times daily as needed for anxiety. 30 tablet 0  . levothyroxine (SYNTHROID, LEVOTHROID) 75 MCG tablet Take 75 mcg by mouth daily before breakfast. Takes 1.5 tabs every Thursday per pt    . metoprolol tartrate (LOPRESSOR) 25 MG tablet Take 0.5 tablets (12.5 mg total) by mouth 2 (two) times daily. 30 tablet 1  . Omega-3 Fatty Acids (FISH OIL OMEGA-3 PO) Take 1 capsule by mouth daily.     [name] oxyCODONE (OXY IR/ROXICODONE) 5 MG immediate release tablet Take 1 tablet (5 mg total) by mouth every 6 (six) hours as needed for severe pain. 30 tablet 0  . pravastatin (PRAVACHOL) 40 MG tablet Take 1 tablet (40 mg total) by mouth daily. 90 tablet 3  . tetrahydrozoline (VISINE) 0.05 % ophthalmic solution Place 1-2 drops into both eyes daily as needed (for dry eyes).    . traZODone (DESYREL) 100 MG tablet Take 50 mg by mouth at bedtime as needed for sleep.     . vitamin C (ASCORBIC ACID) 500 MG tablet Take 500 mg by mouth daily.     No current facility-administered medications for this visit.     Allergies  Allergen Reactions  . Penicillins Itching and Other (See Comments)    PATIENT HAS HAD A PCN REACTION WITH IMMEDIATE RASH, FACIAL/TONGUE/THROAT SWELLING, SOB, OR LIGHTHEADEDNESS WITH HYPOTENSION:  #  #  YES  #  #  Has patient had a PCN reaction causing severe  rash involving mucus membranes or skin necrosis: No Has patient had a PCN reaction that required hospitalization: No Has patient had a PCN reaction occurring within the last 10 years: No If all of the above answers are "NO", then may proceed with Cephalosporin use.   . Ace Inhibitors Cough  . Aspirin Other (See Comments)    H/o GI bleed  . Celebrex [Celecoxib] Other (See Comments)    GI bleed  . Lipitor [Atorvastatin] Other (See Comments)    myalgias    Past Medical History:  Diagnosis Date  . Anemia    due to GIB, s/p transfusion  . Anginal pain ([place])   . Arthritis    back pain, much worse after consecutive golf rounds  . Cancer  ([place])    thyroid  . Chronic kidney disease   . Colon polyps   . Coronary artery disease   . Dyspnea    walking up a hill  . GERD (gastroesophageal reflux disease)   . Hypercalcemia    h/o, resolved as of 2012, prev due to high amount of calcium intake  . Hyperlipidemia   . Hypertension   . IgG gammopathy    stable as of 2012 per [facility]  . Pneumonia    as a child  . PVD (peripheral vascular disease) ([facility])    L leg bypass, R leg stented    Social History   Socioeconomic History  . Marital status: Married    Spouse name: Not on file  . Number of children: Not on file  . Years of education: Not on file  . Highest education level: Not on file  Occupational History  . Not on file  Social Needs  . Financial resource strain: Not on file  . Food insecurity:    Worry: Not on file    Inability: Not on file  . Transportation needs:    Medical: Not on file    Non-medical: Not on file  Tobacco Use  . Smoking status: Former Smoker    Types: Cigarettes    Last attempt to quit: 09/04/2008    Years since quitting: 9.9  . Smokeless tobacco: Former Systems developer    Types: Chew  Substance and Sexual Activity  . Alcohol use: Yes    Alcohol/week: 0.0 standard drinks    Comment: Occasionally  . Drug use: No  . Sexual activity: Yes  Lifestyle  . Physical activity:    Days per week: Not on file    Minutes per session: Not on file  . Stress: Not on file  Relationships  . Social connections:    Talks on phone: Not on file    Gets together: Not on file    Attends religious service: Not on file    Active member of club or organization: Not on file    Attends meetings of clubs or organizations: Not on file    Relationship status: Not on file  . Intimate partner violence:    Fear of current or ex partner: Not on file    Emotionally abused: Not on file    Physically abused: Not on file    Forced sexual activity: Not on file  Other Topics Concern  . Not on file  Social History Narrative    Married 50+ years, 2 kids   [place] to play golf     Family History  Problem Relation Age of Onset  . Diabetes Mother   . Stroke Father   . Colon  cancer Neg Hx   . Prostate cancer Neg Hx      Review of Systems: General: negative for chills, fever, night sweats or weight changes.  Cardiovascular: negative for chest pain, dyspnea on exertion, edema, orthopnea, palpitations, paroxysmal nocturnal dyspnea or shortness of breath Dermatological: negative for rash Respiratory: negative for cough or wheezing Urologic: negative for hematuria Abdominal: negative for nausea, vomiting, diarrhea, bright red blood per rectum, melena, or hematemesis Neurologic: negative for visual changes, syncope, or dizziness All other systems reviewed and are otherwise negative except as noted above.    Blood pressure (!) 124/46, pulse (!) 49, height 5\' 7"  (1.702 m), weight 152 lb (68.9 kg), SpO2 96 %.  General appearance: alert, cooperative and no distress Lungs: clear to auscultation bilaterally Heart: regular rate and rhythm Extremities: no edema Skin: Skin color, texture, turgor normal. No rashes or lesions Neurologic: Grossly normal  EKG NSR, SB-48  ASSESSMENT AND PLAN:   CABG x5 CABG x5 07/11/18- LIMA-LAD, SVG-OM1-OM2-OM3, SVG-RCA  PAF post CABG Post CABG- Amiodarone- not anticoagulated Bradycardic today, Amiodarone decreased, Lanoxin stopped  CRI-3 Check BMP and CBC today   PLAN  Check labs, decrease Amiodarone ad stop Lanoxin.  Follow-up with Dr. Gwenlyn Found in 2 months, if he is holding sinus rhythm then we may be able to stop his amiodarone altogether.  Ryan Ransom PA-C 08/06/2018 10:20 AM

## 2018-08-06 NOTE — Patient Instructions (Addendum)
Medication Instructions:  STOP Lanoxin  DECREASE Amiodarone to One tablet once a day If you need a refill on your cardiac medications before your next appointment, please call your pharmacy.   Lab work: Your physician recommends that you return for lab work in: [facility], BMET If you have labs (blood work) drawn today and your tests are completely normal, you will receive your results only by: [name] MyChart Message (if you have MyChart) OR . A paper copy in the mail If you have any lab test that is abnormal or we need to change your treatment, we will call you to review the results.  Testing/Procedures: NONE   Follow-Up: At [facility], you and your health needs are our priority.  As part of our continuing mission to provide you with exceptional heart care, we have created designated Provider Care Teams.  These Care Teams include your primary Cardiologist (physician) and Advanced Practice Providers (APPs -  Physician Assistants and Nurse Practitioners) who all work together to provide you with the care you need, when you need it. . Your physician recommends that you schedule a follow-up appointment in: 2 MONTHS WITH DR Gwenlyn Found  Any Other Special Instructions Will Be Listed Below (If Applicable).

## 2018-08-07 ENCOUNTER — Encounter: Payer: Self-pay | Admitting: Family Medicine

## 2018-08-07 ENCOUNTER — Ambulatory Visit (INDEPENDENT_AMBULATORY_CARE_PROVIDER_SITE_OTHER): Payer: Medicare Other | Admitting: Family Medicine

## 2018-08-07 VITALS — BP 100/52 | HR 54 | Temp 98.4°F | Ht 67.0 in | Wt 151.5 lb

## 2018-08-07 DIAGNOSIS — I739 Peripheral vascular disease, unspecified: Secondary | ICD-10-CM

## 2018-08-07 DIAGNOSIS — G2581 Restless legs syndrome: Secondary | ICD-10-CM

## 2018-08-07 DIAGNOSIS — I2510 Atherosclerotic heart disease of native coronary artery without angina pectoris: Secondary | ICD-10-CM

## 2018-08-07 DIAGNOSIS — N183 Chronic kidney disease, stage 3 unspecified: Secondary | ICD-10-CM

## 2018-08-07 DIAGNOSIS — I200 Unstable angina: Secondary | ICD-10-CM

## 2018-08-07 DIAGNOSIS — C73 Malignant neoplasm of thyroid gland: Secondary | ICD-10-CM

## 2018-08-07 DIAGNOSIS — D472 Monoclonal gammopathy: Secondary | ICD-10-CM

## 2018-08-07 NOTE — Progress Notes (Signed)
Recent studies:  Summary: Right: 50-74% stenosis noted in the proximal SFA at ostium and distal to the ostium. Possible short segment occlusion in the mid stent. 30-49% stenosis noted in the BK popliteal artery.  Left: 30-49% stenosis noted in the proximal and mid CFA. Two vessel run-off via ATA and peroneal artery. Occluded PTA, s/p angioplasty.  Left Graft(s): Patent left distal CFA-BK popliteal artery bypass graft without restenosis.  ======================================== Summary: Right: Resting right ankle-brachial index indicates moderate right lower extremity arterial disease, high end range. The right toe-brachial index is abnormal.  Left: Resting left ankle-brachial index indicates mild left lower extremity arterial disease. The left toe-brachial index is abnormal.  ========================================  Largest Aortic Diameter: 2.3 cm  Renal:  Right: Normal size right kidney. Normal cortical thickness of right    kidney. Abnormal right Resistive Index. RRV flow present.    Essentially stable 1-59% stenosis of the right renal artery.    Velocities in the right renal artery are elevated and    essentially stable compared to the prior exam. Left: Normal size of left kidney. Normal cortical thickness of the    left kidney. Abnormal left Resistive Index. Now evidence of a    > 60% stenosis in the left renal artery. LRV flow present.    Velocities in the left renal artery are elevated and have    increased compared to the prior exam. Mesenteric: Normal Celiac artery findings. 70 to 99% stenosis in the superior mesenteric artery.  Patent IVC. 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 ========================================  We talked about his inpatient course.  He had some atypical chest symptoms earlier in the summer and  we referred him to cardiology.  That led to further testing and he ended up needing a 5 vessel bypass.  Since discharge from the hospital he has been doing well.  He saw cardiology yesterday.  Digoxin was held and his amiodarone dose was decreased. He has some R calf pain with walking, that predates the surgery.   He is occ lightheaded but that may get better now that he is recently stop digoxin and is on a lower dose of amiodarone.  No CP but he is a little sore at the sternotomy.    Anemia improved but not resolved. He has RLS, predates the surgery. Cr elevated some yesterday, he hadn't been drinking as much fluid.  D/w pt about inc water intake.    He doesn't have abd pain with eating but has h/o trouble swallowing some meats, "getting hung" in the mid esophagus/chest.  No recent sx.  D/w pt about options and routine cautions.  He will update me if his symptoms return or get worse.  No symptoms now.  We can refer to GI if needed.  Likely reasonable to defer since he does not have symptoms at this point and given his recent cardiac events.  PMH and SH reviewed  ROS: Per HPI unless specifically indicated in ROS section   Meds, vitals, and allergies reviewed.   GEN: nad, alert and oriented HEENT: mucous membranes moist NECK: supple w/o LA CV: rrr PULM: ctab, no inc wob ABD: soft, +bs EXT: no edema SKIN: no acute rash Midline sternotomy scar appears to be healing well.

## 2018-08-07 NOTE — Patient Instructions (Addendum)
If you have more trouble swallowing we need to set you up with the GI clinic.   I'll await the notes from cardiology and renal and surgery.  Take care.  Glad to see you.  Drink enough water to keep your urine clear.   Update me as needed.

## 2018-08-08 ENCOUNTER — Ambulatory Visit: Payer: Medicare Other | Admitting: Urology

## 2018-08-08 DIAGNOSIS — I2510 Atherosclerotic heart disease of native coronary artery without angina pectoris: Secondary | ICD-10-CM | POA: Insufficient documentation

## 2018-08-08 NOTE — Assessment & Plan Note (Signed)
He has follow-up at [facility] pending and I will defer.  Discussed.  I appreciate the help of all involved.

## 2018-08-08 NOTE — Assessment & Plan Note (Signed)
He has renal follow-up pending.  I will defer for now.  I do appreciate the help of all involved.  Discussed with him about adequate fluid intake.  He had recent mild creatinine elevation noted on labs per cardiology.  I did not change his medication as I am awaiting cardiology input on those labs.  I appreciate the help of all involved.

## 2018-08-08 NOTE — Assessment & Plan Note (Signed)
I will to defer treatment until we have his other more recent issues addressed.  He agrees.

## 2018-08-08 NOTE — Assessment & Plan Note (Signed)
History of. He has follow-up at [facility] pending and I will defer.  Discussed.  I appreciate the help of all involved.

## 2018-08-08 NOTE — Assessment & Plan Note (Signed)
I will await cardiology input on the studies described above in the note.  He does not have rest pain in the extremities.  He does not have pain with eating.  Routine cautions given about his celiac findings.  I will await cardiology input and patient agreed with that.  Continue current medications for risk factor reduction.

## 2018-08-08 NOTE — Assessment & Plan Note (Signed)
I appreciate the help of all involved.  No change in medications at this point.  I think his fatigue will improve now that he is off digoxin, on the lower dose of amiodarone, and since his hemoglobin is improving in the meantime.  Recent labs discussed with patient.  No change in meds at this point.  I will await the follow-up notes from his consulting doctors.  I greatly appreciate the help of all involved.  Inpatient course discussed with patient and all questions answered to the best of my ability. >25 minutes spent in face to face time with patient, >50% spent in counselling or coordination of care.

## 2018-08-12 DIAGNOSIS — N179 Acute kidney failure, unspecified: Secondary | ICD-10-CM | POA: Diagnosis not present

## 2018-08-12 DIAGNOSIS — I701 Atherosclerosis of renal artery: Secondary | ICD-10-CM | POA: Diagnosis not present

## 2018-08-12 DIAGNOSIS — N182 Chronic kidney disease, stage 2 (mild): Secondary | ICD-10-CM | POA: Diagnosis not present

## 2018-08-12 DIAGNOSIS — I10 Essential (primary) hypertension: Secondary | ICD-10-CM | POA: Diagnosis not present

## 2018-08-15 ENCOUNTER — Other Ambulatory Visit: Payer: Self-pay | Admitting: Physician Assistant

## 2018-08-15 ENCOUNTER — Encounter (HOSPITAL_COMMUNITY): Payer: Self-pay

## 2018-08-16 ENCOUNTER — Other Ambulatory Visit: Payer: Self-pay | Admitting: Surgery

## 2018-08-16 DIAGNOSIS — I2510 Atherosclerotic heart disease of native coronary artery without angina pectoris: Secondary | ICD-10-CM

## 2018-08-17 ENCOUNTER — Other Ambulatory Visit: Payer: Self-pay | Admitting: Physician Assistant

## 2018-08-19 ENCOUNTER — Ambulatory Visit
Admission: RE | Admit: 2018-08-19 | Discharge: 2018-08-19 | Disposition: A | Discharge status: Home / Self Care | Payer: Medicare Other | Source: Ambulatory Visit | Attending: Surgery | Admitting: Surgery

## 2018-08-19 ENCOUNTER — Ambulatory Visit (INDEPENDENT_AMBULATORY_CARE_PROVIDER_SITE_OTHER): Payer: Self-pay | Admitting: Physician Assistant

## 2018-08-19 VITALS — BP 128/47 | HR 51 | Resp 16 | Ht 67.0 in | Wt 151.0 lb

## 2018-08-19 DIAGNOSIS — E785 Hyperlipidemia, unspecified: Secondary | ICD-10-CM

## 2018-08-19 DIAGNOSIS — Z952 Presence of prosthetic heart valve: Secondary | ICD-10-CM | POA: Diagnosis not present

## 2018-08-19 DIAGNOSIS — R918 Other nonspecific abnormal finding of lung field: Secondary | ICD-10-CM | POA: Diagnosis not present

## 2018-08-19 DIAGNOSIS — Z951 Presence of aortocoronary bypass graft: Secondary | ICD-10-CM

## 2018-08-19 DIAGNOSIS — Z8679 Personal history of other diseases of the circulatory system: Secondary | ICD-10-CM

## 2018-08-19 DIAGNOSIS — I2510 Atherosclerotic heart disease of native coronary artery without angina pectoris: Secondary | ICD-10-CM

## 2018-08-19 NOTE — Progress Notes (Signed)
  HPI: Patient returns for routine postoperative follow-up having undergone CABGx5 on 07/11/18. The patient's early postoperative recovery while in the hospital was notable for rapid atrial fibrillation managed with amiodarone resulting in successful conversion to SR.  He otherwise made a progressive and uneventful recovery and was discharged to home.  Since hospital discharge, the patient reports continued progress. He denies pain, shortness of breath, or swelling.   He has experienced some bradycardia since discharge and as result his amiodarone was adjusted by his cardiologist.     Current Outpatient Medications  Medication Sig Dispense Refill  . acetaminophen (TYLENOL) 325 MG tablet Take 2 tablets (650 mg total) by mouth every 6 (six) hours as needed for mild pain.    [name] amiodarone (PACERONE) 200 MG tablet Take 1 tablet (200 mg total) by mouth daily. 60 tablet 1  . clopidogrel (PLAVIX) 75 MG tablet Take 75 mg by mouth daily.     . hydrOXYzine (ATARAX/VISTARIL) 10 MG tablet Take 0.5-1 tablets (5-10 mg total) by mouth 3 (three) times daily as needed for anxiety. 30 tablet 0  . levothyroxine (SYNTHROID, LEVOTHROID) 75 MCG tablet Take 75 mcg by mouth daily before breakfast. Takes 1.5 tabs every Thursday per pt    . metoprolol tartrate (LOPRESSOR) 25 MG tablet Take 0.5 tablets (12.5 mg total) by mouth 2 (two) times daily. 30 tablet 1  . Omega-3 Fatty Acids (FISH OIL OMEGA-3 PO) Take 1 capsule by mouth daily.     . pravastatin (PRAVACHOL) 40 MG tablet Take 1 tablet (40 mg total) by mouth daily. 90 tablet 3  . tetrahydrozoline (VISINE) 0.05 % ophthalmic solution Place 1-2 drops into both eyes daily as needed (for dry eyes).    . traZODone (DESYREL) 100 MG tablet Take 50 mg by mouth at bedtime as needed for sleep.     . vitamin C (ASCORBIC ACID) 500 MG tablet Take 500 mg by mouth daily.     No current facility-administered medications for this visit.     Physical Exam: Heart- RRR Chest-breath  sounds CTA, sternotomy incision is healing well with no evidence of complication.  The sternum is stable EXTs--Trace pre-tibial edema.  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 Medications reviewed and no changes are necessary from CT surgery standpoint.  Sternal and driving precautions reviewed.  Post-op atrial fibrillation--continues to maintain NSR on amiodarone and low-dose metoprolol Antiarrhythmics will be managed by his cardiologist.   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  Message sent to Dr. Alvester Chou regarding follow-up of these findings per patient's request.     Antony Odea, PA-C [facility] [phone]

## 2018-08-19 NOTE — Patient Instructions (Signed)
May resume driving with supervision.  Sternal precautions reviewed.  No change in medications.

## 2018-08-20 DIAGNOSIS — Z08 Encounter for follow-up examination after completed treatment for malignant neoplasm: Secondary | ICD-10-CM | POA: Diagnosis not present

## 2018-08-20 DIAGNOSIS — Z85828 Personal history of other malignant neoplasm of skin: Secondary | ICD-10-CM | POA: Diagnosis not present

## 2018-08-20 DIAGNOSIS — D2261 Melanocytic nevi of right upper limb, including shoulder: Secondary | ICD-10-CM | POA: Diagnosis not present

## 2018-08-20 DIAGNOSIS — L720 Epidermal cyst: Secondary | ICD-10-CM | POA: Diagnosis not present

## 2018-08-20 DIAGNOSIS — D225 Melanocytic nevi of trunk: Secondary | ICD-10-CM | POA: Diagnosis not present

## 2018-08-20 DIAGNOSIS — D2262 Melanocytic nevi of left upper limb, including shoulder: Secondary | ICD-10-CM | POA: Diagnosis not present

## 2018-08-20 DIAGNOSIS — D2272 Melanocytic nevi of left lower limb, including hip: Secondary | ICD-10-CM | POA: Diagnosis not present

## 2018-08-20 DIAGNOSIS — D2271 Melanocytic nevi of right lower limb, including hip: Secondary | ICD-10-CM | POA: Diagnosis not present

## 2018-08-20 DIAGNOSIS — D472 Monoclonal gammopathy: Secondary | ICD-10-CM | POA: Diagnosis not present

## 2018-08-30 ENCOUNTER — Other Ambulatory Visit: Payer: Self-pay | Admitting: *Deleted

## 2018-08-30 MED ORDER — TRAMADOL HCL 50 MG PO TABS: 50.0000 mg | ORAL_TABLET | Freq: Four times a day (QID) | ORAL | 0 refills | Status: DC | PRN

## 2018-08-30 MED ORDER — CLOPIDOGREL BISULFATE 75 MG PO TABS: 75.0000 mg | ORAL_TABLET | Freq: Every day | ORAL | 0 refills | Status: DC

## 2018-08-30 NOTE — Telephone Encounter (Signed)
Will fill #15 tab tramadol and 1 month supply of plavix, then PCP can decide whether or not to continue.

## 2018-08-30 NOTE — Telephone Encounter (Signed)
Faxed refill request.  Tramadol Medication is not on the patient's current meds list and historically has not been prescribed since 2016.  Phoned the patient who states that he has not filled a prescription in probably a couple of years and that he doesn't use it often but is about out of his supply and likes to keep it on hand.  Faxed refill request. Clopidogrel Last office visit:   08/07/18 Last Filled:   03/29/17    #90     3 RF Patient states he thinks this may have been prescribed by the vein specialist at [facility] but would like it to be efilled by Dr. Damita Dunnings.  Please advise.

## 2018-09-04 DIAGNOSIS — I499 Cardiac arrhythmia, unspecified: Secondary | ICD-10-CM

## 2018-09-04 HISTORY — DX: Cardiac arrhythmia, unspecified: I49.9

## 2018-09-04 NOTE — Telephone Encounter (Signed)
Have him update me as needed about the future tramadol need.  We can always ask for cards input on plavix if needed.  Thanks.

## 2018-09-05 ENCOUNTER — Other Ambulatory Visit: Payer: Self-pay | Admitting: Family Medicine

## 2018-09-05 ENCOUNTER — Other Ambulatory Visit: Payer: Self-pay | Admitting: Cardiovascular Disease

## 2018-09-05 ENCOUNTER — Other Ambulatory Visit: Payer: Self-pay | Admitting: Physician Assistant

## 2018-09-05 MED ORDER — METOPROLOL TARTRATE 25 MG PO TABS: 12.5000 mg | ORAL_TABLET | Freq: Two times a day (BID) | ORAL | 10 refills | Status: DC

## 2018-09-05 NOTE — Telephone Encounter (Signed)
Electronic refill request. Trazodone Last office visit:   08/07/18 Last Filled:   #30   12 RF  On 03/29/17 Please advise.

## 2018-09-05 NOTE — Telephone Encounter (Signed)
°*  STAT* If patient is at the pharmacy, call can be transferred to refill team.   1. Which medications need to be refilled? (please list name of each medication and dose if known) need a new prescription for his Metoprolol  2. Which pharmacy/location (including street and city if local pharmacy) is medication to be sent to?[facility]-[phone]  3. Do they need a 30 day or 90 day supply? [place]

## 2018-09-05 NOTE — Telephone Encounter (Signed)
Left detailed message on voicemail.  

## 2018-09-06 NOTE — Telephone Encounter (Signed)
Sent. Thanks.   

## 2018-09-16 ENCOUNTER — Ambulatory Visit (INDEPENDENT_AMBULATORY_CARE_PROVIDER_SITE_OTHER): Payer: Medicare Other | Admitting: Urology

## 2018-09-16 ENCOUNTER — Encounter: Payer: Self-pay | Admitting: Urology

## 2018-09-16 VITALS — BP 137/56 | HR 50 | Ht 67.0 in | Wt 156.0 lb

## 2018-09-16 DIAGNOSIS — N401 Enlarged prostate with lower urinary tract symptoms: Secondary | ICD-10-CM

## 2018-09-16 LAB — BLADDER SCAN AMB NON-IMAGING

## 2018-09-16 MED ORDER — TAMSULOSIN HCL 0.4 MG PO CAPS: 0.4000 mg | ORAL_CAPSULE | Freq: Every day | ORAL | 0 refills | Status: DC

## 2018-09-16 NOTE — Progress Notes (Signed)
09/16/2018 11:30 AM   [name] [birth year] [id number]  Referring provider: Tonia Ghent, MD [address] [place], [place] [address]  Chief Complaint  Patient presents with  . Benign Prostatic Hypertrophy    HPI: 78 year old male presents for annual follow-up of BPH.  He has previously complained of mild lower urinary tract symptoms.  Since his visit last year he has noted worsening voiding symptoms consisting of urinary frequency, intermittency, weak stream and sensation of incomplete emptying.  IPSS completed today was 20/3.  Denies dysuria, gross hematuria or flank/abdominal/pelvic/scrotal pain.   PMH: Past Medical History:  Diagnosis Date  . Anemia    due to GIB, s/p transfusion  . Anginal pain ([place])   . Arthritis    back pain, much worse after consecutive golf rounds  . Cancer ([place])    thyroid  . Chronic kidney disease   . Colon polyps   . Coronary artery disease   . Dyspnea    walking up a hill  . GERD (gastroesophageal reflux disease)   . Hypercalcemia    h/o, resolved as of 2012, prev due to high amount of calcium intake  . Hyperlipidemia   . Hypertension   . IgG gammopathy    stable as of 2012 per [facility]  . Pneumonia    as a child  . PVD (peripheral vascular disease) ([place])    L leg bypass, R leg stented    Surgical History: Past Surgical History:  Procedure Laterality Date  .  dental implant left bottom-did have bleeding     . BYPASS GRAFT     L leg  . COLONOSCOPY WITH PROPOFOL N/A 05/04/2015   Procedure: COLONOSCOPY WITH PROPOFOL;  Surgeon: Lollie Sails, MD;  Location: [facility] ENDOSCOPY;  Service: Endoscopy;  Laterality: N/A;  . CORONARY ARTERY BYPASS GRAFT N/A 07/11/2018   Procedure: CORONARY ARTERY BYPASS GRAFTING (CABG) x three, using left internal mammary artery and right leg greater saphenous vein harvested endoscopically;  Surgeon: Gaye Pollack, MD;  Location: [facility];  Service: Open Heart Surgery;  Laterality: N/A;  . LEFT  HEART CATH AND CORONARY ANGIOGRAPHY N/A 06/24/2018   Procedure: LEFT HEART CATH AND CORONARY ANGIOGRAPHY;  Surgeon: [name], MD;  Location: [facility];  Service: Cardiovascular;  Laterality: N/A;  . TEE WITHOUT CARDIOVERSION N/A 07/11/2018   Procedure: TRANSESOPHAGEAL ECHOCARDIOGRAM (TEE);  Surgeon: Gaye Pollack, MD;  Location: [facility];  Service: Open Heart Surgery;  Laterality: N/A;  . THYROIDECTOMY, PARTIAL  2016    Home Medications:  Allergies as of 09/16/2018      Reactions   Penicillins Itching, Other (See Comments)   PATIENT HAS HAD A PCN REACTION WITH IMMEDIATE RASH, FACIAL/TONGUE/THROAT SWELLING, SOB, OR LIGHTHEADEDNESS WITH HYPOTENSION:  #  #  YES  #  #  Has patient had a PCN reaction causing severe rash involving mucus membranes or skin necrosis: No Has patient had a PCN reaction that required hospitalization: No Has patient had a PCN reaction occurring within the last 10 years: No If all of the above answers are "NO", then may proceed with Cephalosporin use.   Ace Inhibitors Cough   Aspirin Other (See Comments)   H/o GI bleed   Celebrex [celecoxib] Other (See Comments)   GI bleed   Lipitor [atorvastatin] Other (See Comments)   myalgias      Medication List       Accurate as of September 16, 2018 11:30 AM. Always use your most  recent med list.        amiodarone 200 MG tablet Commonly known as:  PACERONE Take 1 tablet (200 mg total) by mouth daily.   clopidogrel 75 MG tablet Commonly known as:  PLAVIX Take 1 tablet (75 mg total) by mouth daily.   Co-Enzyme Q-10 100 MG Caps Take by mouth.   digoxin 0.125 MG tablet Commonly known as:  LANOXIN   FISH OIL OMEGA-3 PO Take 1 capsule by mouth daily.   hydrOXYzine 10 MG tablet Commonly known as:  ATARAX/VISTARIL Take 0.5-1 tablets (5-10 mg total) by mouth 3 (three) times daily as needed for anxiety.   levothyroxine 75 MCG tablet Commonly known as:  SYNTHROID, LEVOTHROID Take 75 mcg by mouth daily  before breakfast. Takes 1.5 tabs every Thursday per pt   metoprolol tartrate 25 MG tablet Commonly known as:  LOPRESSOR Take 0.5 tablets (12.5 mg total) by mouth 2 (two) times daily.   pravastatin 40 MG tablet Commonly known as:  PRAVACHOL Take 1 tablet (40 mg total) by mouth daily.   traMADol 50 MG tablet Commonly known as:  ULTRAM Take 1 tablet (50 mg total) by mouth every 6 (six) hours as needed.   traZODone 100 MG tablet Commonly known as:  DESYREL TAKE 1/2 TO 1 TABLET BY MOUTH AT BEDTIMEAS NEEDED FOR SLEEP   VISINE 0.05 % ophthalmic solution Generic drug:  tetrahydrozoline Place 1-2 drops into both eyes daily as needed (for dry eyes).   vitamin C 500 MG tablet Commonly known as:  ASCORBIC ACID Take 500 mg by mouth daily.       Allergies:  Allergies  Allergen Reactions  . Penicillins Itching and Other (See Comments)    PATIENT HAS HAD A PCN REACTION WITH IMMEDIATE RASH, FACIAL/TONGUE/THROAT SWELLING, SOB, OR LIGHTHEADEDNESS WITH HYPOTENSION:  #  #  YES  #  #  Has patient had a PCN reaction causing severe rash involving mucus membranes or skin necrosis: No Has patient had a PCN reaction that required hospitalization: No Has patient had a PCN reaction occurring within the last 10 years: No If all of the above answers are "NO", then may proceed with Cephalosporin use.   . Ace Inhibitors Cough  . Aspirin Other (See Comments)    H/o GI bleed  . Celebrex [Celecoxib] Other (See Comments)    GI bleed  . Lipitor [Atorvastatin] Other (See Comments)    myalgias    Family History: Family History  Problem Relation Age of Onset  . Diabetes Mother   . Stroke Father   . Colon cancer Neg Hx   . Prostate cancer Neg Hx     Social History:  reports that he quit smoking about 10 years ago. His smoking use included cigarettes. He has quit using smokeless tobacco.  His smokeless tobacco use included chew. He reports current alcohol use. He reports that he does not use  drugs.  ROS: UROLOGY Frequent Urination?: No Hard to postpone urination?: No Burning/pain with urination?: No Get up at night to urinate?: No Leakage of urine?: No Urine stream starts and stops?: No Trouble starting stream?: Yes Do you have to strain to urinate?: No Blood in urine?: No Urinary tract infection?: No Sexually transmitted disease?: No Injury to kidneys or bladder?: No Painful intercourse?: No Weak stream?: No Erection problems?: No Penile pain?: No  Gastrointestinal Nausea?: No Vomiting?: No Indigestion/heartburn?: No Diarrhea?: No Constipation?: No  Constitutional Fever: No Night sweats?: No Weight loss?: No Fatigue?: No  Skin Skin rash/lesions?:  No Itching?: No  Eyes Blurred vision?: No Double vision?: No  Ears/Nose/Throat Sore throat?: No Sinus problems?: No  Hematologic/Lymphatic Swollen glands?: No Easy bruising?: Yes  Cardiovascular Leg swelling?: Yes Chest pain?: No  Respiratory Cough?: No Shortness of breath?: No  Endocrine Excessive thirst?: No  Musculoskeletal Back pain?: No Joint pain?: Yes  Neurological Headaches?: No Dizziness?: No  Psychologic Depression?: No Anxiety?: No  Physical Exam: BP (!) 137/56 (BP Location: Left Arm, Patient Position: Sitting, Cuff Size: Normal)   Pulse (!) 50   Ht 5\' 7"  (1.702 m)   Wt 156 lb (70.8 kg)   BMI 24.43 kg/m   Constitutional:  Alert and oriented, No acute distress. HEENT: [place] AT, moist mucus membranes.  Trachea midline, no masses. Cardiovascular: No clubbing, cyanosis, or edema. Respiratory: Normal respiratory effort, no increased work of breathing. GI: Abdomen is soft, nontender, nondistended, no abdominal masses GU: No CVA tenderness.  Prostate 50 g, smooth without nodules Lymph: No cervical or inguinal lymphadenopathy. Skin: No rashes, bruises or suspicious lesions. Neurologic: Grossly intact, no focal deficits, moving all 4 extremities. Psychiatric: Normal mood  and affect.   Assessment & Plan:    1. Benign prostatic hyperplasia with lower urinary tract symptoms, symptom details unspecified In the last 12 months his voiding symptoms are now bothersome.  PVR by bladder scan today was 119 mL.  He was interested in a trial of medical management and Rx tamsulosin was sent to his pharmacy.  He will call back in 1 month regarding efficacy.  Return in about 1 year (around 09/17/2019) for [place].   Abbie Sons, [place] [address], [place], [place] [address] [phone]

## 2018-09-23 ENCOUNTER — Encounter: Payer: Self-pay | Admitting: *Deleted

## 2018-09-23 ENCOUNTER — Encounter: Payer: Medicare Other | Attending: Cardiovascular Disease | Admitting: *Deleted

## 2018-09-23 VITALS — Ht 66.1 in | Wt 154.4 lb

## 2018-09-23 DIAGNOSIS — Z951 Presence of aortocoronary bypass graft: Secondary | ICD-10-CM | POA: Diagnosis not present

## 2018-09-23 NOTE — Progress Notes (Signed)
Cardiac Individual Treatment Plan  Patient Details  Name: Ryan Pittman MRN: [id number] Date of Birth: Jan 10, 1941 Referring Provider:     Cardiac Rehab from 09/23/2018 in [facility] Cardiac and Pulmonary Rehab  Referring Provider  [name] MD      Initial Encounter Date:    Cardiac Rehab from 09/23/2018 in [facility] Cardiac and Pulmonary Rehab  Date  09/23/18      Visit Diagnosis: S/P CABG x 3  Patient's Home Medications on Admission:  Current Outpatient Medications:  .  amiodarone (PACERONE) 200 MG tablet, Take 1 tablet (200 mg total) by mouth daily., Disp: 60 tablet, Rfl: 1 .  clopidogrel (PLAVIX) 75 MG tablet, Take 1 tablet (75 mg total) by mouth daily., Disp: 30 tablet, Rfl: 0 .  Co-Enzyme Q-10 100 MG CAPS, Take by mouth., Disp: , Rfl:  .  digoxin (LANOXIN) 0.125 MG tablet, , Disp: , Rfl:  .  hydrOXYzine (ATARAX/VISTARIL) 10 MG tablet, Take 0.5-1 tablets (5-10 mg total) by mouth 3 (three) times daily as needed for anxiety., Disp: 30 tablet, Rfl: 0 .  levothyroxine (SYNTHROID, LEVOTHROID) 75 MCG tablet, Take 75 mcg by mouth daily before breakfast. Takes 1.5 tabs every Thursday per pt, Disp: , Rfl:  .  metoprolol tartrate (LOPRESSOR) 25 MG tablet, Take 0.5 tablets (12.5 mg total) by mouth 2 (two) times daily., Disp: 30 tablet, Rfl: 10 .  Omega-3 Fatty Acids (FISH OIL OMEGA-3 PO), Take 1 capsule by mouth daily. , Disp: , Rfl:  .  pravastatin (PRAVACHOL) 40 MG tablet, Take 1 tablet (40 mg total) by mouth daily., Disp: 90 tablet, Rfl: 3 .  tamsulosin (FLOMAX) 0.4 MG CAPS capsule, Take 1 capsule (0.4 mg total) by mouth daily., Disp: 30 capsule, Rfl: 0 .  traZODone (DESYREL) 100 MG tablet, TAKE 1/2 TO 1 TABLET BY MOUTH AT BEDTIMEAS NEEDED FOR SLEEP, Disp: 30 tablet, Rfl: 12 .  vitamin C (ASCORBIC ACID) 500 MG tablet, Take 500 mg by mouth daily., Disp: , Rfl:  .  tetrahydrozoline (VISINE) 0.05 % ophthalmic solution, Place 1-2 drops into both eyes daily as needed (for dry eyes)., Disp: ,  Rfl:  .  traMADol (ULTRAM) 50 MG tablet, Take 1 tablet (50 mg total) by mouth every 6 (six) hours as needed. (Patient not taking: Reported on 09/23/2018), Disp: 15 tablet, Rfl: 0  Past Medical History: Past Medical History:  Diagnosis Date  . Anemia    due to GIB, s/p transfusion  . Anginal pain ([place])   . Arthritis    back pain, much worse after consecutive golf rounds  . Cancer ([place])    thyroid  . Chronic kidney disease   . Colon polyps   . Coronary artery disease   . Dyspnea    walking up a hill  . GERD (gastroesophageal reflux disease)   . Hypercalcemia    h/o, resolved as of 2012, prev due to high amount of calcium intake  . Hyperlipidemia   . Hypertension   . IgG gammopathy    stable as of 2012 per [facility]  . Pneumonia    as a child  . PVD (peripheral vascular disease) ([place])    L leg bypass, R leg stented    Tobacco Use: Social History   Tobacco Use  Smoking Status Former Smoker  . Types: Cigarettes  . Last attempt to quit: 09/04/2008  . Years since quitting: 10.0  Smokeless Tobacco Former Systems developer  . Types: Chew  Tobacco Comment   HAs quit tobacco products 2009  Labs: Recent Review Flowsheet Data    Labs for ITP Cardiac and Pulmonary Rehab Latest Ref Rng & Units 07/11/2018 07/11/2018 07/11/2018 07/12/2018 07/14/2018   Cholestrol 0 - 200 mg/dL - - - - -   LDLCALC 0 - 99 mg/dL - - - - -   LDLDIRECT mg/dL - - - - -   HDL >39.00 mg/dL - - - - -   Trlycerides 0.0 - 149.0 mg/dL - - - - -   Hemoglobin A1c 4.8 - 5.6 % - - - - -   PHART 7.350 - 7.450 7.335(L) 7.342(L) - - -   PCO2ART 32.0 - 48.0 mmHg 35.5 31.9(L) - - -   HCO3 20.0 - 28.0 mmol/L 19.0(L) 17.3(L) - - -   TCO2 22 - 32 mmol/L 20(L) 18(L) 19(L) 25 -   ACIDBASEDEF 0.0 - 2.0 mmol/L 6.0(H) 8.0(H) - - -   O2SAT % 99.0 97.0 - - 56.3       Exercise Target Goals: Exercise Program Goal: Individual exercise prescription set using results from initial 6 min walk test and THRR while considering  patient's activity  barriers and safety.   Exercise Prescription Goal: Initial exercise prescription builds to 30-45 minutes a day of aerobic activity, 2-3 days per week.  Home exercise guidelines will be given to patient during program as part of exercise prescription that the participant will acknowledge.  Activity Barriers & Risk Stratification: Activity Barriers & Cardiac Risk Stratification - 09/23/18 1235      Activity Barriers & Cardiac Risk Stratification   Activity Barriers  Arthritis;Back Problems;Joint Problems   Arthritis in hands/finger and back. Hand joints not flexible because of arthritis   Cardiac Risk Stratification  High       6 Minute Walk: 6 Minute Walk    Row Name 09/23/18 1511         6 Minute Walk   Phase  Initial     Distance  1204 feet     Walk Time  6 minutes     # of Rest Breaks  0     MPH  2.28     METS  1.82     RPE  13     VO2 Peak  6.38     Symptoms  Yes (comment)     Comments  PAD  Claudication Pain Score 4/5     Resting HR  52 bpm     Resting BP  126/72     Resting Oxygen Saturation   96 %     Exercise Oxygen Saturation  during 6 min walk  99 %     Max Ex. HR  84 bpm     Max Ex. BP  154/84     2 Minute Post BP  142/64        Oxygen Initial Assessment:   Oxygen Re-Evaluation:   Oxygen Discharge (Final Oxygen Re-Evaluation):   Initial Exercise Prescription: Initial Exercise Prescription - 09/23/18 1500      Date of Initial Exercise RX and Referring Provider   Date  09/23/18    Referring Provider  [name] MD      Treadmill   MPH  2    Grade  0.5    Minutes  15    METs  2.67      Bike   Level  0.3    Minutes  15    METs  1.8      T5 Nustep   Level  1  SPM  80    Minutes  15    METs  2.5      Prescription Details   Frequency (times per week)  2    Duration  Progress to 30 minutes of continuous aerobic without signs/symptoms of physical distress      Intensity   THRR 40-80% of Max Heartrate  88-123    Ratings of  Perceived Exertion  11-13    Perceived Dyspnea  0-4      Progression   Progression  Continue to progress workloads to maintain intensity without signs/symptoms of physical distress.      Resistance Training   Training Prescription  Yes    Weight  3 lbs    Reps  10-15       Perform Capillary Blood Glucose checks as needed.  Exercise Prescription Changes: Exercise Prescription Changes    Row Name 09/23/18 1200             Response to Exercise   Blood Pressure (Admit)  126/72       Blood Pressure (Exercise)  154/84       Blood Pressure (Exit)  142/64       Heart Rate (Admit)  52 bpm       Heart Rate (Exercise)  84 bpm       Heart Rate (Exit)  54 bpm       Oxygen Saturation (Admit)  96 %       Oxygen Saturation (Exercise)  99 %       Rating of Perceived Exertion (Exercise)  13       Symptoms  CPS 4/5       Comments  walk test results          Exercise Comments:   Exercise Goals and Review: Exercise Goals    Row Name 09/23/18 1517             Exercise Goals   Increase Physical Activity  Yes       Intervention  Provide advice, education, support and counseling about physical activity/exercise needs.;Develop an individualized exercise prescription for aerobic and resistive training based on initial evaluation findings, risk stratification, comorbidities and participant's personal goals.       Expected Outcomes  Short Term: Attend rehab on a regular basis to increase amount of physical activity.;Long Term: Add in home exercise to make exercise part of routine and to increase amount of physical activity.;Long Term: Exercising regularly at least 3-5 days a week.       Increase Strength and Stamina  Yes       Intervention  Provide advice, education, support and counseling about physical activity/exercise needs.;Develop an individualized exercise prescription for aerobic and resistive training based on initial evaluation findings, risk stratification, comorbidities and  participant's personal goals.       Expected Outcomes  Short Term: Increase workloads from initial exercise prescription for resistance, speed, and METs.;Short Term: Perform resistance training exercises routinely during rehab and add in resistance training at home;Long Term: Improve cardiorespiratory fitness, muscular endurance and strength as measured by increased METs and functional capacity (6MWT)       Able to understand and use rate of perceived exertion (RPE) scale  Yes       Intervention  Provide education and explanation on how to use RPE scale       Expected Outcomes  Short Term: Able to use RPE daily in rehab to express subjective intensity level;Long Term:  Able to use RPE  to guide intensity level when exercising independently       Knowledge and understanding of Target Heart Rate Range (THRR)  Yes       Intervention  Provide education and explanation of THRR including how the numbers were predicted and where they are located for reference       Expected Outcomes  Short Term: Able to state/look up THRR;Short Term: Able to use daily as guideline for intensity in rehab;Long Term: Able to use THRR to govern intensity when exercising independently       Able to check pulse independently  Yes       Intervention  Provide education and demonstration on how to check pulse in carotid and radial arteries.;Review the importance of being able to check your own pulse for safety during independent exercise       Expected Outcomes  Short Term: Able to explain why pulse checking is important during independent exercise;Long Term: Able to check pulse independently and accurately       Understanding of Exercise Prescription  Yes       Intervention  Provide education, explanation, and written materials on patient's individual exercise prescription       Expected Outcomes  Short Term: Able to explain program exercise prescription;Long Term: Able to explain home exercise prescription to exercise independently           Exercise Goals Re-Evaluation :   Discharge Exercise Prescription (Final Exercise Prescription Changes): Exercise Prescription Changes - 09/23/18 1200      Response to Exercise   Blood Pressure (Admit)  126/72    Blood Pressure (Exercise)  154/84    Blood Pressure (Exit)  142/64    Heart Rate (Admit)  52 bpm    Heart Rate (Exercise)  84 bpm    Heart Rate (Exit)  54 bpm    Oxygen Saturation (Admit)  96 %    Oxygen Saturation (Exercise)  99 %    Rating of Perceived Exertion (Exercise)  13    Symptoms  CPS 4/5    Comments  walk test results       Nutrition:  Target Goals: Understanding of nutrition guidelines, daily intake of sodium '1500mg'$ , cholesterol '200mg'$ , calories 30% from fat and 7% or less from saturated fats, daily to have 5 or more servings of fruits and vegetables.  Biometrics: Pre Biometrics - 09/23/18 1517      Pre Biometrics   Height  5' 6.1" (1.679 m)    Weight  154 lb 6.4 oz (70 kg)    Waist Circumference  37.5 inches    Hip Circumference  36.5 inches    Waist to Hip Ratio  1.03 %    BMI (Calculated)  24.84    Single Leg Stand  8.01 seconds        Nutrition Therapy Plan and Nutrition Goals: Nutrition Therapy & Goals - 09/23/18 1240      Intervention Plan   Intervention  Prescribe, educate and counsel regarding individualized specific dietary modifications aiming towards targeted core components such as weight, hypertension, lipid management, diabetes, heart failure and other comorbidities.    Expected Outcomes  Short Term Goal: Understand basic principles of dietary content, such as calories, fat, sodium, cholesterol and nutrients.;Short Term Goal: A plan has been developed with personal nutrition goals set during dietitian appointment.;Long Term Goal: Adherence to prescribed nutrition plan.       Nutrition Assessments: Nutrition Assessments - 09/23/18 1240      MEDFICTS Scores   Pre  Score  12       Nutrition Goals  Re-Evaluation:   Nutrition Goals Discharge (Final Nutrition Goals Re-Evaluation):   Psychosocial: Target Goals: Acknowledge presence or absence of significant depression and/or stress, maximize coping skills, provide positive support system. Participant is able to verbalize types and ability to use techniques and skills needed for reducing stress and depression.   Initial Review & Psychosocial Screening: Initial Psych Review & Screening - 09/23/18 1238      Initial Review   Current issues with  None Identified   Does have restless leg syndrome. Some anxiety with it.     Family Dynamics   Good Support System?  Yes   spouse     Barriers   Psychosocial barriers to participate in program  There are no identifiable barriers or psychosocial needs.;The patient should benefit from training in stress management and relaxation.      Screening Interventions   Interventions  Encouraged to exercise;To provide support and resources with identified psychosocial needs;Provide feedback about the scores to participant    Expected Outcomes  Short Term goal: Utilizing psychosocial counselor, staff and physician to assist with identification of specific Stressors or current issues interfering with healing process. Setting desired goal for each stressor or current issue identified.;Long Term Goal: Stressors or current issues are controlled or eliminated.;Short Term goal: Identification and review with participant of any Quality of Life or Depression concerns found by scoring the questionnaire.;Long Term goal: The participant improves quality of Life and PHQ9 Scores as seen by post scores and/or verbalization of changes       Quality of Life Scores:  Quality of Life - 09/23/18 1239      Quality of Life   Select  Quality of Life      Quality of Life Scores   Health/Function Pre  28.2 %    Socioeconomic Pre  30 %    Psych/Spiritual Pre  30 %    Family Pre  27.6 %    GLOBAL Pre  28.85 %      Scores  of 19 and below usually indicate a poorer quality of life in these areas.  A difference of  2-3 points is a clinically meaningful difference.  A difference of 2-3 points in the total score of the Quality of Life Index has been associated with significant improvement in overall quality of life, self-image, physical symptoms, and general health in studies assessing change in quality of life.  PHQ-9: Recent Review Flowsheet Data    Depression screen [facility] 2/9 09/23/2018 03/27/2018 03/22/2017 12/24/2015   Decreased Interest 0 0 0 0   Down, Depressed, Hopeless 0 0 0 0   PHQ - 2 Score 0 0 0 0   Altered sleeping 0 0 - -   Tired, decreased energy 1 0 - -   Change in appetite 0 0 - -   Feeling bad or failure about yourself  0 0 - -   Trouble concentrating 0 0 - -   Moving slowly or fidgety/restless 0 0 - -   Suicidal thoughts 0 0 - -   PHQ-9 Score 1 0 - -   Difficult doing work/chores Somewhat difficult Not difficult at all - -     Interpretation of Total Score  Total Score Depression Severity:  1-4 = Minimal depression, 5-9 = Mild depression, 10-14 = Moderate depression, 15-19 = Moderately severe depression, 20-27 = Severe depression   Psychosocial Evaluation and Intervention:   Psychosocial Re-Evaluation:   Psychosocial  Discharge (Final Psychosocial Re-Evaluation):   Vocational Rehabilitation: Provide vocational rehab assistance to qualifying candidates.   Vocational Rehab Evaluation & Intervention: Vocational Rehab - 09/23/18 1241      Initial Vocational Rehab Evaluation & Intervention   Assessment shows need for Vocational Rehabilitation  No       Education: Education Goals: Education classes will be provided on a variety of topics geared toward better understanding of heart health and risk factor modification. Participant will state understanding/return demonstration of topics presented as noted by education test scores.  Learning Barriers/Preferences: Learning  Barriers/Preferences - 09/23/18 1240      Learning Barriers/Preferences   Learning Barriers  None    Learning Preferences  Individual Instruction       Education Topics:  AED/CPR: - Group verbal and written instruction with the use of models to demonstrate the basic use of the AED with the basic ABC's of resuscitation.   General Nutrition Guidelines/Fats and Fiber: -Group instruction provided by verbal, written material, models and posters to present the general guidelines for heart healthy nutrition. Gives an explanation and review of dietary fats and fiber.   Controlling Sodium/Reading Food Labels: -Group verbal and written material supporting the discussion of sodium use in heart healthy nutrition. Review and explanation with models, verbal and written materials for utilization of the food label.   Exercise Physiology & General Exercise Guidelines: - Group verbal and written instruction with models to review the exercise physiology of the cardiovascular system and associated critical values. Provides general exercise guidelines with specific guidelines to those with heart or lung disease.    Aerobic Exercise & Resistance Training: - Gives group verbal and written instruction on the various components of exercise. Focuses on aerobic and resistive training programs and the benefits of this training and how to safely progress through these programs..   Flexibility, Balance, Mind/Body Relaxation: Provides group verbal/written instruction on the benefits of flexibility and balance training, including mind/body exercise modes such as yoga, pilates and tai chi.  Demonstration and skill practice provided.   Stress and Anxiety: - Provides group verbal and written instruction about the health risks of elevated stress and causes of high stress.  Discuss the correlation between heart/lung disease and anxiety and treatment options. Review healthy ways to manage with stress and  anxiety.   Depression: - Provides group verbal and written instruction on the correlation between heart/lung disease and depressed mood, treatment options, and the stigmas associated with seeking treatment.   Anatomy & Physiology of the Heart: - Group verbal and written instruction and models provide basic cardiac anatomy and physiology, with the coronary electrical and arterial systems. Review of Valvular disease and Heart Failure   Cardiac Procedures: - Group verbal and written instruction to review commonly prescribed medications for heart disease. Reviews the medication, class of the drug, and side effects. Includes the steps to properly store meds and maintain the prescription regimen. (beta blockers and nitrates)   Cardiac Medications I: - Group verbal and written instruction to review commonly prescribed medications for heart disease. Reviews the medication, class of the drug, and side effects. Includes the steps to properly store meds and maintain the prescription regimen.   Cardiac Medications II: -Group verbal and written instruction to review commonly prescribed medications for heart disease. Reviews the medication, class of the drug, and side effects. (all other drug classes)    Go Sex-Intimacy & Heart Disease, Get SMART - Goal Setting: - Group verbal and written instruction through game format to discuss  heart disease and the return to sexual intimacy. Provides group verbal and written material to discuss and apply goal setting through the application of the S.M.A.R.T. Method.   Other Matters of the Heart: - Provides group verbal, written materials and models to describe Stable Angina and Peripheral Artery. Includes description of the disease process and treatment options available to the cardiac patient.   Exercise & Equipment Safety: - Individual verbal instruction and demonstration of equipment use and safety with use of the equipment.   Cardiac Rehab from 09/23/2018  in [facility] Cardiac and Pulmonary Rehab  Date  09/23/18  Educator  [facility]  Instruction Review Code  1- Verbalizes Understanding      Infection Prevention: - Provides verbal and written material to individual with discussion of infection control including proper hand washing and proper equipment cleaning during exercise session.   Cardiac Rehab from 09/23/2018 in [facility] Cardiac and Pulmonary Rehab  Date  09/23/18  Educator  [facility]  Instruction Review Code  1- Verbalizes Understanding      Falls Prevention: - Provides verbal and written material to individual with discussion of falls prevention and safety.   Cardiac Rehab from 09/23/2018 in [facility] Cardiac and Pulmonary Rehab  Date  09/23/18  Educator  [facility]  Instruction Review Code  1- Verbalizes Understanding      Diabetes: - Individual verbal and written instruction to review signs/symptoms of diabetes, desired ranges of glucose level fasting, after meals and with exercise. Acknowledge that pre and post exercise glucose checks will be done for 3 sessions at entry of program.   Know Your Numbers and Risk Factors: -Group verbal and written instruction about important numbers in your health.  Discussion of what are risk factors and how they play a role in the disease process.  Review of Cholesterol, Blood Pressure, Diabetes, and BMI and the role they play in your overall health.   Sleep Hygiene: -Provides group verbal and written instruction about how sleep can affect your health.  Define sleep hygiene, discuss sleep cycles and impact of sleep habits. Review good sleep hygiene tips.    Other: -Provides group and verbal instruction on various topics (see comments)   Knowledge Questionnaire Score: Knowledge Questionnaire Score - 09/23/18 1241      Knowledge Questionnaire Score   Pre Score  20/26   Reviewed correct responses with Mykle today. He verbalized understanding of the responses and had no further questions today.       Core  Components/Risk Factors/Patient Goals at Admission: Personal Goals and Risk Factors at Admission - 09/23/18 1241      Core Components/Risk Factors/Patient Goals on Admission   Hypertension  Yes    Intervention  Provide education on lifestyle modifcations including regular physical activity/exercise, weight management, moderate sodium restriction and increased consumption of fresh fruit, vegetables, and low fat dairy, alcohol moderation, and smoking cessation.;Monitor prescription use compliance.    Expected Outcomes  Short Term: Continued assessment and intervention until BP is < 140/69m HG in hypertensive participants. < 130/858mHG in hypertensive participants with diabetes, heart failure or chronic kidney disease.;Long Term: Maintenance of blood pressure at goal levels.    Lipids  Yes    Intervention  Provide education and support for participant on nutrition & aerobic/resistive exercise along with prescribed medications to achieve LDL '70mg'$ , HDL >'40mg'$ .    Expected Outcomes  Short Term: Participant states understanding of desired cholesterol values and is compliant with medications prescribed. Participant is following exercise prescription and nutrition guidelines.;Long Term: Cholesterol controlled  with medications as prescribed, with individualized exercise RX and with personalized nutrition plan. Value goals: LDL < 66m, HDL > 40 mg.       Core Components/Risk Factors/Patient Goals Review:    Core Components/Risk Factors/Patient Goals at Discharge (Final Review):    ITP Comments: ITP Comments    Row Name 09/23/18 1224           ITP Comments  Medical review completed today. ITP sent to Dr MLoleta Chancefor review,changes as needed and signature. [facility]          Comments: Initial ITP

## 2018-09-23 NOTE — Patient Instructions (Signed)
Patient Instructions  Patient Details  Name: Ryan Pittman MRN: [id number] Date of Birth: 07-15-1941 Referring Provider:  [name], MD  Below are your personal goals for exercise, nutrition, and risk factors. Our goal is to help you stay on track towards obtaining and maintaining these goals. We will be discussing your progress on these goals with you throughout the program.  Initial Exercise Prescription: Initial Exercise Prescription - 09/23/18 1500      Date of Initial Exercise RX and Referring Provider   Date  09/23/18    Referring Provider  [name] MD      Treadmill   MPH  2    Grade  0.5    Minutes  15    METs  2.67      Bike   Level  0.3    Minutes  15    METs  1.8      T5 Nustep   Level  1    SPM  80    Minutes  15    METs  2.5      Prescription Details   Frequency (times per week)  2    Duration  Progress to 30 minutes of continuous aerobic without signs/symptoms of physical distress      Intensity   THRR 40-80% of Max Heartrate  88-123    Ratings of Perceived Exertion  11-13    Perceived Dyspnea  0-4      Progression   Progression  Continue to progress workloads to maintain intensity without signs/symptoms of physical distress.      Resistance Training   Training Prescription  Yes    Weight  3 lbs    Reps  10-15       Exercise Goals: Frequency: Be able to perform aerobic exercise two to three times per week in program working toward 2-5 days per week of home exercise.  Intensity: Work with a perceived exertion of 11 (fairly light) - 15 (hard) while following your exercise prescription.  We will make changes to your prescription with you as you progress through the program.   Duration: Be able to do 30 to 45 minutes of continuous aerobic exercise in addition to a 5 minute warm-up and a 5 minute cool-down routine.   Nutrition Goals: Your personal nutrition goals will be established when you do your nutrition analysis with the  dietician.  The following are general nutrition guidelines to follow: Cholesterol < 200mg /day Sodium < 1500mg /day Fiber: Men over 50 yrs - 30 grams per day  Personal Goals: Personal Goals and Risk Factors at Admission - 09/23/18 1241      Core Components/Risk Factors/Patient Goals on Admission   Hypertension  Yes    Intervention  Provide education on lifestyle modifcations including regular physical activity/exercise, weight management, moderate sodium restriction and increased consumption of fresh fruit, vegetables, and low fat dairy, alcohol moderation, and smoking cessation.;Monitor prescription use compliance.    Expected Outcomes  Short Term: Continued assessment and intervention until BP is < 140/81mm HG in hypertensive participants. < 130/83mm HG in hypertensive participants with diabetes, heart failure or chronic kidney disease.;Long Term: Maintenance of blood pressure at goal levels.    Lipids  Yes    Intervention  Provide education and support for participant on nutrition & aerobic/resistive exercise along with prescribed medications to achieve LDL 70mg , HDL >40mg .    Expected Outcomes  Short Term: Participant states understanding of desired cholesterol values and is compliant with medications prescribed. Participant is  following exercise prescription and nutrition guidelines.;Long Term: Cholesterol controlled with medications as prescribed, with individualized exercise RX and with personalized nutrition plan. Value goals: LDL < 70mg , HDL > 40 mg.       Tobacco Use Initial Evaluation: Social History   Tobacco Use  Smoking Status Former Smoker  . Types: Cigarettes  . Last attempt to quit: 09/04/2008  . Years since quitting: 10.0  Smokeless Tobacco Former Systems developer  . Types: Chew  Tobacco Comment   HAs quit tobacco products 2009    Exercise Goals and Review: Exercise Goals    Row Name 09/23/18 1517             Exercise Goals   Increase Physical Activity  Yes        Intervention  Provide advice, education, support and counseling about physical activity/exercise needs.;Develop an individualized exercise prescription for aerobic and resistive training based on initial evaluation findings, risk stratification, comorbidities and participant's personal goals.       Expected Outcomes  Short Term: Attend rehab on a regular basis to increase amount of physical activity.;Long Term: Add in home exercise to make exercise part of routine and to increase amount of physical activity.;Long Term: Exercising regularly at least 3-5 days a week.       Increase Strength and Stamina  Yes       Intervention  Provide advice, education, support and counseling about physical activity/exercise needs.;Develop an individualized exercise prescription for aerobic and resistive training based on initial evaluation findings, risk stratification, comorbidities and participant's personal goals.       Expected Outcomes  Short Term: Increase workloads from initial exercise prescription for resistance, speed, and METs.;Short Term: Perform resistance training exercises routinely during rehab and add in resistance training at home;Long Term: Improve cardiorespiratory fitness, muscular endurance and strength as measured by increased METs and functional capacity (6MWT)       Able to understand and use rate of perceived exertion (RPE) scale  Yes       Intervention  Provide education and explanation on how to use RPE scale       Expected Outcomes  Short Term: Able to use RPE daily in rehab to express subjective intensity level;Long Term:  Able to use RPE to guide intensity level when exercising independently       Knowledge and understanding of Target Heart Rate Range (THRR)  Yes       Intervention  Provide education and explanation of THRR including how the numbers were predicted and where they are located for reference       Expected Outcomes  Short Term: Able to state/look up THRR;Short Term: Able to use  daily as guideline for intensity in rehab;Long Term: Able to use THRR to govern intensity when exercising independently       Able to check pulse independently  Yes       Intervention  Provide education and demonstration on how to check pulse in carotid and radial arteries.;Review the importance of being able to check your own pulse for safety during independent exercise       Expected Outcomes  Short Term: Able to explain why pulse checking is important during independent exercise;Long Term: Able to check pulse independently and accurately       Understanding of Exercise Prescription  Yes       Intervention  Provide education, explanation, and written materials on patient's individual exercise prescription       Expected Outcomes  Short Term: Able to  explain program exercise prescription;Long Term: Able to explain home exercise prescription to exercise independently          Copy of goals given to participant.

## 2018-09-23 NOTE — Progress Notes (Signed)
Daily Session Note  Patient Details  Name: Ryan Pittman MRN: [id number] Date of Birth: 10/04/1940 Referring Provider:     Cardiac Rehab from 09/23/2018 in [facility] Cardiac and Pul[facility] Rehab  Referring Provider  [name] MD      Encounter Date: 09/23/2018  Check In: Session Check In - 09/23/18 1225      Check-In   Supervising physician immediately available to respond to emergencies  See telemetry face sheet for immediately available ER MD    Location  [facility]-Cardiac & Pulmonary Rehab    Staff Present  Heath Lark, RN, BSN, CCRP;Yenty Bloch [place], MA, RCEP, CCRP, Exercise Physiologist;Meredith Sherryll Burger, RN BSN    VAD Patient?  No    PAD/SET Patient?  No      Pain Assessment   Currently in Pain?  No/denies        Exercise Prescription Changes - 09/23/18 1200      Response to Exercise   Blood Pressure (Admit)  126/72    Blood Pressure (Exercise)  154/84    Blood Pressure (Exit)  142/64    Heart Rate (Admit)  52 bpm    Heart Rate (Exercise)  84 bpm    Heart Rate (Exit)  54 bpm    Oxygen Saturation (Admit)  96 %    Oxygen Saturation (Exercise)  99 %    Rating of Perceived Exertion (Exercise)  13    Symptoms  CPS 4/5    Comments  walk test results       Social History   Tobacco Use  Smoking Status Former Smoker  . Types: Cigarettes  . Last attempt to quit: 09/04/2008  . Years since quitting: 10.0  Smokeless Tobacco Former Systems developer  . Types: Chew  Tobacco Comment   HAs quit tobacco products 2009    Goals Met:  Exercise tolerated well Personal goals reviewed No report of cardiac concerns or symptoms Strength training completed today  Goals Unmet:  Not Applicable  Comments: Medical evaluation and walk test completed   Dr. Emily Filbert is Medical Director for [place] and [facility].

## 2018-10-01 ENCOUNTER — Encounter: Payer: Medicare Other | Admitting: *Deleted

## 2018-10-01 DIAGNOSIS — Z951 Presence of aortocoronary bypass graft: Secondary | ICD-10-CM | POA: Diagnosis not present

## 2018-10-01 NOTE — Progress Notes (Signed)
Daily Session Note  Patient Details  Name: Ryan Pittman MRN: [id number] Date of Birth: November 15, 1940 Referring Provider:     Cardiac Rehab from 09/23/2018 in [facility] Cardiac and Pulmonary Rehab  Referring Provider  [name] MD      Encounter Date: 10/01/2018  Check In: Session Check In - 10/01/18 0930      Check-In   Supervising physician immediately available to respond to emergencies  See telemetry face sheet for immediately available ER MD    Location  [facility]-Cardiac & Pulmonary Rehab    Staff Present  Jasper Loser BS, Exercise Physiologist;Tomasita Beevers Luan Pulling, MA, RCEP, CCRP, Exercise Physiologist;Amanda Oletta Darter, BA, ACSM CEP, Exercise Physiologist;Krista Frederico Hamman, RN BSN    Medication changes reported      No    Fall or balance concerns reported     No    Warm-up and Cool-down  Performed as group-led Higher education careers adviser Performed  Yes    VAD Patient?  No    PAD/SET Patient?  No      Pain Assessment   Currently in Pain?  No/denies          Social History   Tobacco Use  Smoking Status Former Smoker  . Types: Cigarettes  . Last attempt to quit: 09/04/2008  . Years since quitting: 10.0  Smokeless Tobacco Former Systems developer  . Types: Chew  Tobacco Comment   HAs quit tobacco products 2009    Goals Met:  Exercise tolerated well Personal goals reviewed No report of cardiac concerns or symptoms Strength training completed today  Goals Unmet:  Not Applicable  Comments:First full day of exercise!  Patient was oriented to gym and equipment including functions, settings, policies, and procedures.  Patient's individual exercise prescription and treatment plan were reviewed.  All starting workloads were established based on the results of the 6 minute walk test done at initial orientation visit.  The plan for exercise progression was also introduced and progression will be customized based on patient's performance and goals.     Dr. Emily Filbert is Medical  Director for [place] and [facility].

## 2018-10-02 DIAGNOSIS — Z951 Presence of aortocoronary bypass graft: Secondary | ICD-10-CM

## 2018-10-02 NOTE — Progress Notes (Signed)
Cardiac Individual Treatment Plan  Patient Details  Name: Ryan Pittman MRN: [id number] Date of Birth: 19-May-1941 Referring Provider:     Cardiac Rehab from 09/23/2018 in [facility] Cardiac and Pulmonary Rehab  Referring Provider  [name] MD      Initial Encounter Date:    Cardiac Rehab from 09/23/2018 in [facility] Cardiac and Pulmonary Rehab  Date  09/23/18      Visit Diagnosis: S/P CABG x 3  Patient's Home Medications on Admission:  Current Outpatient Medications:  .  amiodarone (PACERONE) 200 MG tablet, Take 1 tablet (200 mg total) by mouth daily., Disp: 60 tablet, Rfl: 1 .  clopidogrel (PLAVIX) 75 MG tablet, Take 1 tablet (75 mg total) by mouth daily., Disp: 30 tablet, Rfl: 0 .  Co-Enzyme Q-10 100 MG CAPS, Take by mouth., Disp: , Rfl:  .  digoxin (LANOXIN) 0.125 MG tablet, , Disp: , Rfl:  .  hydrOXYzine (ATARAX/VISTARIL) 10 MG tablet, Take 0.5-1 tablets (5-10 mg total) by mouth 3 (three) times daily as needed for anxiety., Disp: 30 tablet, Rfl: 0 .  levothyroxine (SYNTHROID, LEVOTHROID) 75 MCG tablet, Take 75 mcg by mouth daily before breakfast. Takes 1.5 tabs every Thursday per pt, Disp: , Rfl:  .  metoprolol tartrate (LOPRESSOR) 25 MG tablet, Take 0.5 tablets (12.5 mg total) by mouth 2 (two) times daily., Disp: 30 tablet, Rfl: 10 .  Omega-3 Fatty Acids (FISH OIL OMEGA-3 PO), Take 1 capsule by mouth daily. , Disp: , Rfl:  .  pravastatin (PRAVACHOL) 40 MG tablet, Take 1 tablet (40 mg total) by mouth daily., Disp: 90 tablet, Rfl: 3 .  tamsulosin (FLOMAX) 0.4 MG CAPS capsule, Take 1 capsule (0.4 mg total) by mouth daily., Disp: 30 capsule, Rfl: 0 .  tetrahydrozoline (VISINE) 0.05 % ophthalmic solution, Place 1-2 drops into both eyes daily as needed (for dry eyes)., Disp: , Rfl:  .  traMADol (ULTRAM) 50 MG tablet, Take 1 tablet (50 mg total) by mouth every 6 (six) hours as needed. (Patient not taking: Reported on 09/23/2018), Disp: 15 tablet, Rfl: 0 .  traZODone (DESYREL) 100 MG  tablet, TAKE 1/2 TO 1 TABLET BY MOUTH AT BEDTIMEAS NEEDED FOR SLEEP, Disp: 30 tablet, Rfl: 12 .  vitamin C (ASCORBIC ACID) 500 MG tablet, Take 500 mg by mouth daily., Disp: , Rfl:   Past Medical History: Past Medical History:  Diagnosis Date  . Anemia    due to GIB, s/p transfusion  . Anginal pain ([place])   . Arthritis    back pain, much worse after consecutive golf rounds  . Cancer ([place])    thyroid  . Chronic kidney disease   . Colon polyps   . Coronary artery disease   . Dyspnea    walking up a hill  . GERD (gastroesophageal reflux disease)   . Hypercalcemia    h/o, resolved as of 2012, prev due to high amount of calcium intake  . Hyperlipidemia   . Hypertension   . IgG gammopathy    stable as of 2012 per [facility]  . Pneumonia    as a child  . PVD (peripheral vascular disease) ([place])    L leg bypass, R leg stented    Tobacco Use: Social History   Tobacco Use  Smoking Status Former Smoker  . Types: Cigarettes  . Last attempt to quit: 09/04/2008  . Years since quitting: 10.0  Smokeless Tobacco Former Systems developer  . Types: Chew  Tobacco Comment   HAs quit tobacco products 2009  Labs: Recent Review Flowsheet Data    Labs for ITP Cardiac and Pulmonary Rehab Latest Ref Rng & Units 07/11/2018 07/11/2018 07/11/2018 07/12/2018 07/14/2018   Cholestrol 0 - 200 mg/dL - - - - -   LDLCALC 0 - 99 mg/dL - - - - -   LDLDIRECT mg/dL - - - - -   HDL >39.00 mg/dL - - - - -   Trlycerides 0.0 - 149.0 mg/dL - - - - -   Hemoglobin A1c 4.8 - 5.6 % - - - - -   PHART 7.350 - 7.450 7.335(L) 7.342(L) - - -   PCO2ART 32.0 - 48.0 mmHg 35.5 31.9(L) - - -   HCO3 20.0 - 28.0 mmol/L 19.0(L) 17.3(L) - - -   TCO2 22 - 32 mmol/L 20(L) 18(L) 19(L) 25 -   ACIDBASEDEF 0.0 - 2.0 mmol/L 6.0(H) 8.0(H) - - -   O2SAT % 99.0 97.0 - - 56.3       Exercise Target Goals: Exercise Program Goal: Individual exercise prescription set using results from initial 6 min walk test and THRR while considering  patient's  activity barriers and safety.   Exercise Prescription Goal: Initial exercise prescription builds to 30-45 minutes a day of aerobic activity, 2-3 days per week.  Home exercise guidelines will be given to patient during program as part of exercise prescription that the participant will acknowledge.  Activity Barriers & Risk Stratification: Activity Barriers & Cardiac Risk Stratification - 09/23/18 1235      Activity Barriers & Cardiac Risk Stratification   Activity Barriers  Arthritis;Back Problems;Joint Problems   Arthritis in hands/finger and back. Hand joints not flexible because of arthritis   Cardiac Risk Stratification  High       6 Minute Walk: 6 Minute Walk    Row Name 09/23/18 1511         6 Minute Walk   Phase  Initial     Distance  1204 feet     Walk Time  6 minutes     # of Rest Breaks  0     MPH  2.28     METS  1.82     RPE  13     VO2 Peak  6.38     Symptoms  Yes (comment)     Comments  PAD  Claudication Pain Score 4/5     Resting HR  52 bpm     Resting BP  126/72     Resting Oxygen Saturation   96 %     Exercise Oxygen Saturation  during 6 min walk  99 %     Max Ex. HR  84 bpm     Max Ex. BP  154/84     2 Minute Post BP  142/64        Oxygen Initial Assessment:   Oxygen Re-Evaluation:   Oxygen Discharge (Final Oxygen Re-Evaluation):   Initial Exercise Prescription: Initial Exercise Prescription - 09/23/18 1500      Date of Initial Exercise RX and Referring Provider   Date  09/23/18    Referring Provider  [name] MD      Treadmill   MPH  2    Grade  0.5    Minutes  15    METs  2.67      Bike   Level  0.3    Minutes  15    METs  1.8      T5 Nustep   Level  1  SPM  80    Minutes  15    METs  2.5      Prescription Details   Frequency (times per week)  2    Duration  Progress to 30 minutes of continuous aerobic without signs/symptoms of physical distress      Intensity   THRR 40-80% of Max Heartrate  88-123    Ratings  of Perceived Exertion  11-13    Perceived Dyspnea  0-4      Progression   Progression  Continue to progress workloads to maintain intensity without signs/symptoms of physical distress.      Resistance Training   Training Prescription  Yes    Weight  3 lbs    Reps  10-15       Perform Capillary Blood Glucose checks as needed.  Exercise Prescription Changes: Exercise Prescription Changes    Row Name 09/23/18 1200             Response to Exercise   Blood Pressure (Admit)  126/72       Blood Pressure (Exercise)  154/84       Blood Pressure (Exit)  142/64       Heart Rate (Admit)  52 bpm       Heart Rate (Exercise)  84 bpm       Heart Rate (Exit)  54 bpm       Oxygen Saturation (Admit)  96 %       Oxygen Saturation (Exercise)  99 %       Rating of Perceived Exertion (Exercise)  13       Symptoms  CPS 4/5       Comments  walk test results          Exercise Comments: Exercise Comments    Row Name 10/01/18 0932           Exercise Comments  :First full day of exercise!  Patient was oriented to gym and equipment including functions, settings, policies, and procedures.  Patient's individual exercise prescription and treatment plan were reviewed.  All starting workloads were established based on the results of the 6 minute walk test done at initial orientation visit.  The plan for exercise progression was also introduced and progression will be customized based on patient's performance and goals.          Exercise Goals and Review: Exercise Goals    Row Name 09/23/18 1517             Exercise Goals   Increase Physical Activity  Yes       Intervention  Provide advice, education, support and counseling about physical activity/exercise needs.;Develop an individualized exercise prescription for aerobic and resistive training based on initial evaluation findings, risk stratification, comorbidities and participant's personal goals.       Expected Outcomes  Short Term: Attend  rehab on a regular basis to increase amount of physical activity.;Long Term: Add in home exercise to make exercise part of routine and to increase amount of physical activity.;Long Term: Exercising regularly at least 3-5 days a week.       Increase Strength and Stamina  Yes       Intervention  Provide advice, education, support and counseling about physical activity/exercise needs.;Develop an individualized exercise prescription for aerobic and resistive training based on initial evaluation findings, risk stratification, comorbidities and participant's personal goals.       Expected Outcomes  Short Term: Increase workloads from initial exercise prescription for resistance, speed, and  METs.;Short Term: Perform resistance training exercises routinely during rehab and add in resistance training at home;Long Term: Improve cardiorespiratory fitness, muscular endurance and strength as measured by increased METs and functional capacity (6MWT)       Able to understand and use rate of perceived exertion (RPE) scale  Yes       Intervention  Provide education and explanation on how to use RPE scale       Expected Outcomes  Short Term: Able to use RPE daily in rehab to express subjective intensity level;Long Term:  Able to use RPE to guide intensity level when exercising independently       Knowledge and understanding of Target Heart Rate Range (THRR)  Yes       Intervention  Provide education and explanation of THRR including how the numbers were predicted and where they are located for reference       Expected Outcomes  Short Term: Able to state/look up THRR;Short Term: Able to use daily as guideline for intensity in rehab;Long Term: Able to use THRR to govern intensity when exercising independently       Able to check pulse independently  Yes       Intervention  Provide education and demonstration on how to check pulse in carotid and radial arteries.;Review the importance of being able to check your own pulse for  safety during independent exercise       Expected Outcomes  Short Term: Able to explain why pulse checking is important during independent exercise;Long Term: Able to check pulse independently and accurately       Understanding of Exercise Prescription  Yes       Intervention  Provide education, explanation, and written materials on patient's individual exercise prescription       Expected Outcomes  Short Term: Able to explain program exercise prescription;Long Term: Able to explain home exercise prescription to exercise independently          Exercise Goals Re-Evaluation : Exercise Goals Re-Evaluation    Row Name 10/01/18 0932             Exercise Goal Re-Evaluation   Exercise Goals Review  Increase Physical Activity;Increase Strength and Stamina;Able to understand and use rate of perceived exertion (RPE) scale;Knowledge and understanding of Target Heart Rate Range (THRR);Understanding of Exercise Prescription       Comments  Reviewed RPE scale, THR and program prescription with pt today.  Pt voiced understanding and was given a copy of goals to take home.        Expected Outcomes  Short: Use RPE daily to regulate intensity. Long: Follow program prescription in THR.          Discharge Exercise Prescription (Final Exercise Prescription Changes): Exercise Prescription Changes - 09/23/18 1200      Response to Exercise   Blood Pressure (Admit)  126/72    Blood Pressure (Exercise)  154/84    Blood Pressure (Exit)  142/64    Heart Rate (Admit)  52 bpm    Heart Rate (Exercise)  84 bpm    Heart Rate (Exit)  54 bpm    Oxygen Saturation (Admit)  96 %    Oxygen Saturation (Exercise)  99 %    Rating of Perceived Exertion (Exercise)  13    Symptoms  CPS 4/5    Comments  walk test results       Nutrition:  Target Goals: Understanding of nutrition guidelines, daily intake of sodium <1513m, cholesterol <2096m calories 30% from  fat and 7% or less from saturated fats, daily to have 5 or  more servings of fruits and vegetables.  Biometrics: Pre Biometrics - 09/23/18 1517      Pre Biometrics   Height  5' 6.1" (1.679 m)    Weight  154 lb 6.4 oz (70 kg)    Waist Circumference  37.5 inches    Hip Circumference  36.5 inches    Waist to Hip Ratio  1.03 %    BMI (Calculated)  24.84    Single Leg Stand  8.01 seconds        Nutrition Therapy Plan and Nutrition Goals: Nutrition Therapy & Goals - 09/23/18 1240      Intervention Plan   Intervention  Prescribe, educate and counsel regarding individualized specific dietary modifications aiming towards targeted core components such as weight, hypertension, lipid management, diabetes, heart failure and other comorbidities.    Expected Outcomes  Short Term Goal: Understand basic principles of dietary content, such as calories, fat, sodium, cholesterol and nutrients.;Short Term Goal: A plan has been developed with personal nutrition goals set during dietitian appointment.;Long Term Goal: Adherence to prescribed nutrition plan.       Nutrition Assessments: Nutrition Assessments - 09/23/18 1240      MEDFICTS Scores   Pre Score  12       Nutrition Goals Re-Evaluation:   Nutrition Goals Discharge (Final Nutrition Goals Re-Evaluation):   Psychosocial: Target Goals: Acknowledge presence or absence of significant depression and/or stress, maximize coping skills, provide positive support system. Participant is able to verbalize types and ability to use techniques and skills needed for reducing stress and depression.   Initial Review & Psychosocial Screening: Initial Psych Review & Screening - 09/23/18 1238      Initial Review   Current issues with  None Identified   Does have restless leg syndrome. Some anxiety with it.     Family Dynamics   Good Support System?  Yes   spouse     Barriers   Psychosocial barriers to participate in program  There are no identifiable barriers or psychosocial needs.;The patient should benefit  from training in stress management and relaxation.      Screening Interventions   Interventions  Encouraged to exercise;To provide support and resources with identified psychosocial needs;Provide feedback about the scores to participant    Expected Outcomes  Short Term goal: Utilizing psychosocial counselor, staff and physician to assist with identification of specific Stressors or current issues interfering with healing process. Setting desired goal for each stressor or current issue identified.;Long Term Goal: Stressors or current issues are controlled or eliminated.;Short Term goal: Identification and review with participant of any Quality of Life or Depression concerns found by scoring the questionnaire.;Long Term goal: The participant improves quality of Life and PHQ9 Scores as seen by post scores and/or verbalization of changes       Quality of Life Scores:  Quality of Life - 09/23/18 1239      Quality of Life   Select  Quality of Life      Quality of Life Scores   Health/Function Pre  28.2 %    Socioeconomic Pre  30 %    Psych/Spiritual Pre  30 %    Family Pre  27.6 %    GLOBAL Pre  28.85 %      Scores of 19 and below usually indicate a poorer quality of life in these areas.  A difference of  2-3 points is a clinically meaningful difference.  A difference of 2-3 points in the total score of the Quality of Life Index has been associated with significant improvement in overall quality of life, self-image, physical symptoms, and general health in studies assessing change in quality of life.  PHQ-9: Recent Review Flowsheet Data    Depression screen [facility] 2/9 09/23/2018 03/27/2018 03/22/2017 12/24/2015   Decreased Interest 0 0 0 0   Down, Depressed, Hopeless 0 0 0 0   PHQ - 2 Score 0 0 0 0   Altered sleeping 0 0 - -   Tired, decreased energy 1 0 - -   Change in appetite 0 0 - -   Feeling bad or failure about yourself  0 0 - -   Trouble concentrating 0 0 - -   Moving slowly or  fidgety/restless 0 0 - -   Suicidal thoughts 0 0 - -   PHQ-9 Score 1 0 - -   Difficult doing work/chores Somewhat difficult Not difficult at all - -     Interpretation of Total Score  Total Score Depression Severity:  1-4 = Minimal depression, 5-9 = Mild depression, 10-14 = Moderate depression, 15-19 = Moderately severe depression, 20-27 = Severe depression   Psychosocial Evaluation and Intervention:   Psychosocial Re-Evaluation:   Psychosocial Discharge (Final Psychosocial Re-Evaluation):   Vocational Rehabilitation: Provide vocational rehab assistance to qualifying candidates.   Vocational Rehab Evaluation & Intervention: Vocational Rehab - 09/23/18 1241      Initial Vocational Rehab Evaluation & Intervention   Assessment shows need for Vocational Rehabilitation  No       Education: Education Goals: Education classes will be provided on a variety of topics geared toward better understanding of heart health and risk factor modification. Participant will state understanding/return demonstration of topics presented as noted by education test scores.  Learning Barriers/Preferences: Learning Barriers/Preferences - 09/23/18 1240      Learning Barriers/Preferences   Learning Barriers  None    Learning Preferences  Individual Instruction       Education Topics:  AED/CPR: - Group verbal and written instruction with the use of models to demonstrate the basic use of the AED with the basic ABC's of resuscitation.   General Nutrition Guidelines/Fats and Fiber: -Group instruction provided by verbal, written material, models and posters to present the general guidelines for heart healthy nutrition. Gives an explanation and review of dietary fats and fiber.   Controlling Sodium/Reading Food Labels: -Group verbal and written material supporting the discussion of sodium use in heart healthy nutrition. Review and explanation with models, verbal and written materials for  utilization of the food label.   Exercise Physiology & General Exercise Guidelines: - Group verbal and written instruction with models to review the exercise physiology of the cardiovascular system and associated critical values. Provides general exercise guidelines with specific guidelines to those with heart or lung disease.    Aerobic Exercise & Resistance Training: - Gives group verbal and written instruction on the various components of exercise. Focuses on aerobic and resistive training programs and the benefits of this training and how to safely progress through these programs..   Flexibility, Balance, Mind/Body Relaxation: Provides group verbal/written instruction on the benefits of flexibility and balance training, including mind/body exercise modes such as yoga, pilates and tai chi.  Demonstration and skill practice provided.   Cardiac Rehab from 10/01/2018 in [facility] Cardiac and Pulmonary Rehab  Date  10/01/18  Educator  AS  Instruction Review Code  1- Verbalizes Understanding  Stress and Anxiety: - Provides group verbal and written instruction about the health risks of elevated stress and causes of high stress.  Discuss the correlation between heart/lung disease and anxiety and treatment options. Review healthy ways to manage with stress and anxiety.   Depression: - Provides group verbal and written instruction on the correlation between heart/lung disease and depressed mood, treatment options, and the stigmas associated with seeking treatment.   Anatomy & Physiology of the Heart: - Group verbal and written instruction and models provide basic cardiac anatomy and physiology, with the coronary electrical and arterial systems. Review of Valvular disease and Heart Failure   Cardiac Procedures: - Group verbal and written instruction to review commonly prescribed medications for heart disease. Reviews the medication, class of the drug, and side effects. Includes the steps to  properly store meds and maintain the prescription regimen. (beta blockers and nitrates)   Cardiac Medications I: - Group verbal and written instruction to review commonly prescribed medications for heart disease. Reviews the medication, class of the drug, and side effects. Includes the steps to properly store meds and maintain the prescription regimen.   Cardiac Medications II: -Group verbal and written instruction to review commonly prescribed medications for heart disease. Reviews the medication, class of the drug, and side effects. (all other drug classes)    Go Sex-Intimacy & Heart Disease, Get SMART - Goal Setting: - Group verbal and written instruction through game format to discuss heart disease and the return to sexual intimacy. Provides group verbal and written material to discuss and apply goal setting through the application of the S.M.A.R.T. Method.   Other Matters of the Heart: - Provides group verbal, written materials and models to describe Stable Angina and Peripheral Artery. Includes description of the disease process and treatment options available to the cardiac patient.   Exercise & Equipment Safety: - Individual verbal instruction and demonstration of equipment use and safety with use of the equipment.   Cardiac Rehab from 10/01/2018 in [facility] Cardiac and Pulmonary Rehab  Date  09/23/18  Educator  [facility]  Instruction Review Code  1- Verbalizes Understanding      Infection Prevention: - Provides verbal and written material to individual with discussion of infection control including proper hand washing and proper equipment cleaning during exercise session.   Cardiac Rehab from 10/01/2018 in [facility] Cardiac and Pulmonary Rehab  Date  09/23/18  Educator  [facility]  Instruction Review Code  1- Verbalizes Understanding      Falls Prevention: - Provides verbal and written material to individual with discussion of falls prevention and safety.   Cardiac Rehab from 10/01/2018 in [facility]  Cardiac and Pulmonary Rehab  Date  09/23/18  Educator  [facility]  Instruction Review Code  1- Verbalizes Understanding      Diabetes: - Individual verbal and written instruction to review signs/symptoms of diabetes, desired ranges of glucose level fasting, after meals and with exercise. Acknowledge that pre and post exercise glucose checks will be done for 3 sessions at entry of program.   Know Your Numbers and Risk Factors: -Group verbal and written instruction about important numbers in your health.  Discussion of what are risk factors and how they play a role in the disease process.  Review of Cholesterol, Blood Pressure, Diabetes, and BMI and the role they play in your overall health.   Sleep Hygiene: -Provides group verbal and written instruction about how sleep can affect your health.  Define sleep hygiene, discuss sleep cycles and impact of sleep  habits. Review good sleep hygiene tips.    Other: -Provides group and verbal instruction on various topics (see comments)   Knowledge Questionnaire Score: Knowledge Questionnaire Score - 09/23/18 1241      Knowledge Questionnaire Score   Pre Score  20/26   Reviewed correct responses with Demarquez today. He verbalized understanding of the responses and had no further questions today.       Core Components/Risk Factors/Patient Goals at Admission: Personal Goals and Risk Factors at Admission - 09/23/18 1241      Core Components/Risk Factors/Patient Goals on Admission   Hypertension  Yes    Intervention  Provide education on lifestyle modifcations including regular physical activity/exercise, weight management, moderate sodium restriction and increased consumption of fresh fruit, vegetables, and low fat dairy, alcohol moderation, and smoking cessation.;Monitor prescription use compliance.    Expected Outcomes  Short Term: Continued assessment and intervention until BP is < 140/60m HG in hypertensive participants. < 130/831mHG in  hypertensive participants with diabetes, heart failure or chronic kidney disease.;Long Term: Maintenance of blood pressure at goal levels.    Lipids  Yes    Intervention  Provide education and support for participant on nutrition & aerobic/resistive exercise along with prescribed medications to achieve LDL <7076mHDL >67m68m  Expected Outcomes  Short Term: Participant states understanding of desired cholesterol values and is compliant with medications prescribed. Participant is following exercise prescription and nutrition guidelines.;Long Term: Cholesterol controlled with medications as prescribed, with individualized exercise RX and with personalized nutrition plan. Value goals: LDL < 70mg17mL > 40 mg.       Core Components/Risk Factors/Patient Goals Review:    Core Components/Risk Factors/Patient Goals at Discharge (Final Review):    ITP Comments: ITP Comments    Row Name 09/23/18 1224 10/02/18 0940         ITP Comments  Medical review completed today. ITP sent to Dr M MilLoleta Chancereview,changes as needed and signature. Documentation of diagnosis [facility] found [facility]  30 Day Review. Continue with ITP unless directed changes per Medical Director review.         Comments: 30 day review

## 2018-10-03 ENCOUNTER — Encounter: Payer: Medicare Other | Admitting: *Deleted

## 2018-10-03 ENCOUNTER — Other Ambulatory Visit: Payer: Self-pay | Admitting: Family Medicine

## 2018-10-03 DIAGNOSIS — Z951 Presence of aortocoronary bypass graft: Secondary | ICD-10-CM

## 2018-10-03 NOTE — Progress Notes (Signed)
Daily Session Note  Patient Details  Name: Ryan Pittman MRN: [id number] Date of Birth: 01-09-1941 Referring Provider:     Cardiac Rehab from 09/23/2018 in [facility] Cardiac and Pulmonary Rehab  Referring Provider  [name] MD      Encounter Date: 10/03/2018  Check In: Session Check In - 10/03/18 0920      Check-In   Supervising physician immediately available to respond to emergencies  See telemetry face sheet for immediately available ER MD    Location  [facility]-Cardiac & Pulmonary Rehab    Staff Present  Jasper Loser BS, Exercise Physiologist;Carroll Enterkin, RN, Levie Heritage, MA, RCEP, CCRP, Exercise Physiologist    Medication changes reported      No    Fall or balance concerns reported     No    Warm-up and Cool-down  Performed as group-led instruction    Resistance Training Performed  Yes    VAD Patient?  No    PAD/SET Patient?  No      Pain Assessment   Currently in Pain?  No/denies          Social History   Tobacco Use  Smoking Status Former Smoker  . Types: Cigarettes  . Last attempt to quit: 09/04/2008  . Years since quitting: 10.0  Smokeless Tobacco Former Systems developer  . Types: Chew  Tobacco Comment   HAs quit tobacco products 2009    Goals Met:  Independence with exercise equipment Exercise tolerated well No report of cardiac concerns or symptoms Strength training completed today  Goals Unmet:  Not Applicable  Comments: Pt able to follow exercise prescription today without complaint.  Will continue to monitor for progression.    Dr. Emily Filbert is Medical Director for [place] and [facility].

## 2018-10-03 NOTE — Telephone Encounter (Signed)
Plavix Last filled:  08/30/18, #30/0 Last OV:  07/19/18, f/u Next OV:  04/07/19, CPE

## 2018-10-04 NOTE — Telephone Encounter (Signed)
Sent. Thanks.   

## 2018-10-08 ENCOUNTER — Encounter: Payer: Self-pay | Admitting: Cardiovascular Disease

## 2018-10-08 ENCOUNTER — Ambulatory Visit (INDEPENDENT_AMBULATORY_CARE_PROVIDER_SITE_OTHER): Payer: Medicare Other | Admitting: Cardiovascular Disease

## 2018-10-08 DIAGNOSIS — I480 Paroxysmal atrial fibrillation: Secondary | ICD-10-CM | POA: Diagnosis not present

## 2018-10-08 DIAGNOSIS — I739 Peripheral vascular disease, unspecified: Secondary | ICD-10-CM

## 2018-10-08 DIAGNOSIS — E782 Mixed hyperlipidemia: Secondary | ICD-10-CM

## 2018-10-08 DIAGNOSIS — I150 Renovascular hypertension: Secondary | ICD-10-CM

## 2018-10-08 DIAGNOSIS — Z951 Presence of aortocoronary bypass graft: Secondary | ICD-10-CM | POA: Diagnosis not present

## 2018-10-08 DIAGNOSIS — I10 Essential (primary) hypertension: Secondary | ICD-10-CM

## 2018-10-08 NOTE — Assessment & Plan Note (Signed)
History of paroxysmal atrial fibrillation in the perioperative.  Maintaining sinus rhythm on low-dose amiodarone.  We will stop his amiodarone.

## 2018-10-08 NOTE — Assessment & Plan Note (Signed)
History of essential hypertension with blood pressure measured today at 136/56.  He is on metoprolol.  Continue current meds at current dosing.

## 2018-10-08 NOTE — Assessment & Plan Note (Signed)
History of moderate bilateral renal artery stenosis by recent duplex ultrasound with normal dimensions of both kidneys.  His blood pressure is under good control on 1 antihypertensive medication.  Continue to follow this on annual basis.

## 2018-10-08 NOTE — Progress Notes (Signed)
10/08/2018 [name]   1941/07/02  [id number]  Primary Physician Ryan Ghent, MD Primary Cardiologist: Ryan Harp MD Ryan Pittman, [name]  HPI:  Ryan Pittman is a 78 y.o.  mildly overweight married Caucasian male father of 2, grandfather for grandchildren who is accompanied by his wife Ryan Pittman today.  He is referred by Dr. Elsie Pittman for cardiovascular evaluation because of chest pain.  I last saw him in the office 04/09/2018. His risk factors include 50 pack years of tobacco abuse having quit 8 years ago as well as treated hypertension and hyperlipidemia.  There is no family history.  He is never had a heart attack or stroke.  He does complain of some chest pain that began several months ago occurring on a monthly basis lasting minutes at a time.  He does have a history of PAD status post left femoropopliteal bypass grafting several years ago by Dr. Lucky Pittman in [place].  He had a positive stress test and subsequent coronary CTA on 05/24/2018 revealing a high calcium score of 2858 with three-vessel calcification.  He ultimately underwent cardiac catheterization by myself 06/24/2018 revealing three-vessel disease with preserved LV function.  He ultimately underwent CABG x5 by Dr. Cyndia Pittman 07/11/2018 and has done well since.  He is participating cardiac rehab.  He denies chest pain, shortness of breath or claudication.  Current Meds  Medication Sig  . amiodarone (PACERONE) 200 MG tablet Take 1 tablet (200 mg total) by mouth daily.  . clopidogrel (PLAVIX) 75 MG tablet TAKE ONE TABLET EVERY DAY  . Co-Enzyme Q-10 100 MG CAPS Take by mouth.  . digoxin (LANOXIN) 0.125 MG tablet   . levothyroxine (SYNTHROID, LEVOTHROID) 75 MCG tablet Take 75 mcg by mouth daily before breakfast. Takes 1.5 tabs every Thursday per pt  . metoprolol tartrate (LOPRESSOR) 25 MG tablet Take 0.5 tablets (12.5 mg total) by mouth 2 (two) times daily.  . Omega-3 Fatty Acids (FISH OIL OMEGA-3 PO) Take 1 capsule  by mouth daily.   . pravastatin (PRAVACHOL) 40 MG tablet Take 1 tablet (40 mg total) by mouth daily.  . tamsulosin (FLOMAX) 0.4 MG CAPS capsule Take 1 capsule (0.4 mg total) by mouth daily.  . traZODone (DESYREL) 100 MG tablet TAKE 1/2 TO 1 TABLET BY MOUTH AT BEDTIMEAS NEEDED FOR SLEEP  . vitamin C (ASCORBIC ACID) 500 MG tablet Take 500 mg by mouth daily.  . [DISCONTINUED] hydrOXYzine (ATARAX/VISTARIL) 10 MG tablet Take 0.5-1 tablets (5-10 mg total) by mouth 3 (three) times daily as needed for anxiety.  . [DISCONTINUED] tetrahydrozoline (VISINE) 0.05 % ophthalmic solution Place 1-2 drops into both eyes daily as needed (for dry eyes).  . [DISCONTINUED] traMADol (ULTRAM) 50 MG tablet Take 1 tablet (50 mg total) by mouth every 6 (six) hours as needed.     Allergies  Allergen Reactions  . Penicillins Itching and Other (See Comments)    PATIENT HAS HAD A PCN REACTION WITH IMMEDIATE RASH, FACIAL/TONGUE/THROAT SWELLING, SOB, OR LIGHTHEADEDNESS WITH HYPOTENSION:  #  #  YES  #  #  Has patient had a PCN reaction causing severe rash involving mucus membranes or skin necrosis: No Has patient had a PCN reaction that required hospitalization: No Has patient had a PCN reaction occurring within the last 10 years: No If all of the above answers are "NO", then may proceed with Cephalosporin use.   . Ace Inhibitors Cough  . Aspirin Other (See Comments)    H/o GI bleed  .  Celebrex [Celecoxib] Other (See Comments)    GI bleed  . Lipitor [Atorvastatin] Other (See Comments)    myalgias    Social History   Socioeconomic History  . Marital status: Married    Spouse name: Not on file  . Number of children: Not on file  . Years of education: Not on file  . Highest education level: Not on file  Occupational History  . Not on file  Social Needs  . Financial resource strain: Not on file  . Food insecurity:    Worry: Not on file    Inability: Not on file  . Transportation needs:    Medical: Not on file      Non-medical: Not on file  Tobacco Use  . Smoking status: Former Smoker    Types: Cigarettes    Last attempt to quit: 09/04/2008    Years since quitting: 10.0  . Smokeless tobacco: Former Systems developer    Types: Chew  . Tobacco comment: HAs quit tobacco products 2009  Substance and Sexual Activity  . Alcohol use: Yes    Alcohol/week: 0.0 standard drinks    Comment: Occasionally  . Drug use: No  . Sexual activity: Yes  Lifestyle  . Physical activity:    Days per week: Not on file    Minutes per session: Not on file  . Stress: Not on file  Relationships  . Social connections:    Talks on phone: Not on file    Gets together: Not on file    Attends religious service: Not on file    Active member of club or organization: Not on file    Attends meetings of clubs or organizations: Not on file    Relationship status: Not on file  . Intimate partner violence:    Fear of current or ex partner: Not on file    Emotionally abused: Not on file    Physically abused: Not on file    Forced sexual activity: Not on file  Other Topics Concern  . Not on file  Social History Narrative   Married 50+ years, 2 kids   Runs [place] [place] [employer]   Likes to play golf     Review of Systems: General: negative for chills, fever, night sweats or weight changes.  Cardiovascular: negative for chest pain, dyspnea on exertion, edema, orthopnea, palpitations, paroxysmal nocturnal dyspnea or shortness of breath Dermatological: negative for rash Respiratory: negative for cough or wheezing Urologic: negative for hematuria Abdominal: negative for nausea, vomiting, diarrhea, bright red blood per rectum, melena, or hematemesis Neurologic: negative for visual changes, syncope, or dizziness All other systems reviewed and are otherwise negative except as noted above.    Blood pressure (!) 136/56, pulse (!) 55, height 5\' 7"  (1.702 m), weight 155 lb (70.3 kg).  General appearance: alert and no distress Neck: no adenopathy,  no carotid bruit, no JVD, supple, symmetrical, trachea midline and thyroid not enlarged, symmetric, no tenderness/mass/nodules Lungs: clear to auscultation bilaterally Heart: regular rate and rhythm, S1, S2 normal, no murmur, click, rub or gallop Extremities: extremities normal, atraumatic, no cyanosis or edema Pulses: 2+ and symmetric Skin: Skin color, texture, turgor normal. No rashes or lesions Neurologic: Alert and oriented X 3, normal strength and tone. Normal symmetric reflexes. Normal coordination and gait  EKG not performed today  ASSESSMENT AND PLAN:   PVD (peripheral vascular disease) ([place]) History of peripheral arterial disease status post left femoropopliteal bypass grafting by Dr. Lucky Pittman in [place].  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  He denies claudication.  HTN (hypertension) History of essential hypertension with blood pressure measured today at 136/56.  He is on metoprolol.  Continue current meds at current dosing.  Hyperlipidemia History of hyperlipidemia on statin therapy.  We will recheck a lipid and liver profile.  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  His blood pressure is under good control on 1 antihypertensive medication.  Continue to follow this on annual basis.  PAF (paroxysmal atrial fibrillation) (HCC) History of paroxysmal atrial fibrillation in the perioperative.  Maintaining sinus rhythm on low-dose amiodarone.  We will stop his amiodarone.  Hx of CABG History of CAD status post CABG x5 by Dr. Cyndia Pittman 07/11/2018 after cardiac catheterization performed by myself 06/24/2018 revealed three-vessel disease with preserved LV function.  He is participating cardiac rehab.  He is asymptomatic.      Ryan Harp MD FACP,FACC,FAHA, [facility] 10/08/2018 10:24 AM

## 2018-10-08 NOTE — Assessment & Plan Note (Signed)
History of hyperlipidemia on statin therapy. We will recheck a lipid and liver profile 

## 2018-10-08 NOTE — Assessment & Plan Note (Signed)
History of peripheral arterial disease status post left femoropopliteal bypass grafting by Dr. Lucky Cowboy in [place].  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  He denies claudication.

## 2018-10-08 NOTE — Patient Instructions (Signed)
Medication Instructions:  STOP TAKING YOUR AMIODARONE. If you need a refill on your cardiac medications before your next appointment, please call your pharmacy.   Lab work: Your physician recommends that you return for lab work in: 1-2 [place]  If you have labs (blood work) drawn today and your tests are completely normal, you will receive your results only by: [name] MyChart Message (if you have MyChart) OR . A paper copy in the mail If you have any lab test that is abnormal or we need to change your treatment, we will call you to review the results.  Testing/Procedures: Your physician has requested that you have a renal artery duplex. During this test, an ultrasound is used to evaluate blood flow to the kidneys. Allow one hour for this exam. Do not eat after midnight the day before and avoid carbonated beverages. Take your medications as you usually do.  FEBRUARY 2021   Follow-Up: At [facility], you and your health needs are our priority.  As part of our continuing mission to provide you with exceptional heart care, we have created designated Provider Care Teams.  These Care Teams include your primary Cardiologist (physician) and Advanced Practice Providers (APPs -  Physician Assistants and Nurse Practitioners) who all work together to provide you with the care you need, when you need it. . You will need a follow up appointment in [place], [place] AND [address].  Please call our office 2 months in advance to schedule this appointment.  You may see Dr. Gwenlyn Found or one of the following Advanced Practice Providers on your designated Care Team:   . Kerin Ransom, [place] . Almyra Deforest, PA-C . Fabian Sharp, PA-C . Jory Sims, DNP . Rosaria Ferries, PA-C . Roby Lofts, PA-C . Sande Rives, PA-C

## 2018-10-08 NOTE — Assessment & Plan Note (Signed)
History of CAD status post CABG x5 by Dr. Cyndia Bent 07/11/2018 after cardiac catheterization performed by myself 06/24/2018 revealed three-vessel disease with preserved LV function.  He is participating cardiac rehab.  He is asymptomatic.

## 2018-10-10 ENCOUNTER — Encounter: Payer: Medicare Other | Attending: Cardiovascular Disease | Admitting: *Deleted

## 2018-10-10 DIAGNOSIS — Z951 Presence of aortocoronary bypass graft: Secondary | ICD-10-CM | POA: Insufficient documentation

## 2018-10-10 NOTE — Progress Notes (Signed)
Daily Session Note  Patient Details  Name: PURVIS SIDLE MRN: [id number] Date of Birth: August 02, 1941 Referring Provider:     Cardiac Rehab from 09/23/2018 in [facility] Cardiac and Pulmonary Rehab  Referring Provider  [name] MD      Encounter Date: 10/10/2018  Check In: Session Check In - 10/10/18 0858      Check-In   Supervising physician immediately available to respond to emergencies  See telemetry face sheet for immediately available ER MD    Location  [facility]-Cardiac & Pulmonary Rehab    Staff Present  Jasper Loser BS, Exercise Physiologist;Carroll Enterkin, RN, Levie Heritage, MA, RCEP, CCRP, Exercise Physiologist    Medication changes reported      No    Fall or balance concerns reported     No    Warm-up and Cool-down  Performed as group-led instruction    Resistance Training Performed  Yes    VAD Patient?  No    PAD/SET Patient?  No      Pain Assessment   Currently in Pain?  No/denies          Social History   Tobacco Use  Smoking Status Former Smoker  . Types: Cigarettes  . Last attempt to quit: 09/04/2008  . Years since quitting: 10.1  Smokeless Tobacco Former Systems developer  . Types: Chew  Tobacco Comment   HAs quit tobacco products 2009    Goals Met:  Independence with exercise equipment Exercise tolerated well Personal goals reviewed No report of cardiac concerns or symptoms Strength training completed today  Goals Unmet:  Not Applicable  Comments: Pt able to follow exercise prescription today without complaint.  Will continue to monitor for progression.    Dr. Emily Filbert is Medical Director for [place] and [facility].

## 2018-10-15 ENCOUNTER — Encounter: Payer: Medicare Other | Admitting: *Deleted

## 2018-10-15 DIAGNOSIS — Z951 Presence of aortocoronary bypass graft: Secondary | ICD-10-CM

## 2018-10-15 NOTE — Progress Notes (Signed)
Daily Session Note  Patient Details  Name: Ryan Pittman MRN: [id number] Date of Birth: 23-Feb-1941 Referring Provider:     Cardiac Rehab from 09/23/2018 in [facility] Cardiac and Pulmonary Rehab  Referring Provider  [name] MD      Encounter Date: 10/15/2018  Check In: Session Check In - 10/15/18 0910      Check-In   Supervising physician immediately available to respond to emergencies  See telemetry face sheet for immediately available ER MD    Location  [facility]-Cardiac & Pulmonary Rehab    Staff Present  Heath Lark, RN, BSN, CCRP;Jeanna Durrell BS, Exercise Physiologist;Jessica [place], MA, RCEP, CCRP, Exercise Physiologist    Medication changes reported      No    Fall or balance concerns reported     No    Warm-up and Cool-down  Performed as group-led instruction    Resistance Training Performed  Yes    VAD Patient?  No    PAD/SET Patient?  No      Pain Assessment   Currently in Pain?  No/denies          Social History   Tobacco Use  Smoking Status Former Smoker  . Types: Cigarettes  . Last attempt to quit: 09/04/2008  . Years since quitting: 10.1  Smokeless Tobacco Former Systems developer  . Types: Chew  Tobacco Comment   HAs quit tobacco products 2009    Goals Met:  Independence with exercise equipment Exercise tolerated well No report of cardiac concerns or symptoms Strength training completed today  Goals Unmet:  Not Applicable  Comments: Pt able to follow exercise prescription today without complaint.  Will continue to monitor for progression.    Dr. Emily Filbert is Medical Director for [place] and [facility].

## 2018-10-17 DIAGNOSIS — Z951 Presence of aortocoronary bypass graft: Secondary | ICD-10-CM | POA: Diagnosis not present

## 2018-10-17 NOTE — Progress Notes (Signed)
Daily Session Note  Patient Details  Name: Ryan Pittman MRN: [id number] Date of Birth: 1941/01/17 Referring Provider:     Cardiac Rehab from 09/23/2018 in [facility] Cardiac and Pulmonary Rehab  Referring Provider  [name] MD      Encounter Date: 10/17/2018  Check In: Session Check In - 10/17/18 0915      Check-In   Supervising physician immediately available to respond to emergencies  See telemetry face sheet for immediately available ER MD    Location  [facility]-Cardiac & Pulmonary Rehab    Staff Present  Gerlene Burdock, RN, BSN;Banita Lehn BS, Exercise Physiologist;Jessica Luan [place]ing, MA, RCEP, CCRP, Exercise Physiologist    Medication changes reported      No    Fall or balance concerns reported     No    Warm-up and Cool-down  Performed as group-led instruction    Resistance Training Performed  Yes    VAD Patient?  No    PAD/SET Patient?  No      Pain Assessment   Currently in Pain?  No/denies          Social History   Tobacco Use  Smoking Status Former Smoker  . Types: Cigarettes  . Last attempt to quit: 09/04/2008  . Years since quitting: 10.1  Smokeless Tobacco Former Systems developer  . Types: Chew  Tobacco Comment   HAs quit tobacco products 2009    Goals Met:  Independence with exercise equipment Exercise tolerated well No report of cardiac concerns or symptoms Strength training completed today  Goals Unmet:  Not Applicable  Comments: Pt able to follow exercise prescription today without complaint.  Will continue to monitor for progression.    Dr. Emily Filbert is Medical Director for [place] and [facility].

## 2018-10-22 DIAGNOSIS — Z951 Presence of aortocoronary bypass graft: Secondary | ICD-10-CM | POA: Diagnosis not present

## 2018-10-22 NOTE — Progress Notes (Signed)
Daily Session Note  Patient Details  Name: SHED NIXON MRN: [id number] Date of Birth: 1941/03/01 Referring Provider:     Cardiac Rehab from 09/23/2018 in [facility] Cardiac and Pulmonary Rehab  Referring Provider  [name] MD      Encounter Date: 10/22/2018  Check In: Session Check In - 10/22/18 0939      Check-In   Supervising physician immediately available to respond to emergencies  See telemetry face sheet for immediately available ER MD    Location  [facility]-Cardiac [facility] Pulmonary Rehab    Staff Present  Heath Lark, RN, BSN, CCRP;Takelia Urieta BS, Exercise Physiologist;Amanda Sommer, BA, ACSM CEP, Exercise Physiologist    Medication changes reported      No    Fall or balance concerns reported     No    Tobacco Cessation  No Change    Warm-up and Cool-down  Performed as group-led instruction    Resistance Training Performed  Yes    VAD Patient?  No    PAD/SET Patient?  No      Pain Assessment   Currently in Pain?  No/denies    Multiple Pain Sites  No          Social History   Tobacco Use  Smoking Status Former Smoker  . Types: Cigarettes  . Last attempt to quit: 09/04/2008  . Years since quitting: 10.1  Smokeless Tobacco Former Systems developer  . Types: Chew  Tobacco Comment   HAs quit tobacco products 2009    Goals Met:  Independence with exercise equipment Exercise tolerated well No report of cardiac concerns or symptoms Strength training completed today  Goals Unmet:  Not Applicable  Comments: Pt able to follow exercise prescription today without complaint.  Will continue to monitor for progression.    Dr. Emily Filbert is Medical Director for [place] and [facility].

## 2018-10-23 DIAGNOSIS — E782 Mixed hyperlipidemia: Secondary | ICD-10-CM | POA: Diagnosis not present

## 2018-10-24 DIAGNOSIS — Z951 Presence of aortocoronary bypass graft: Secondary | ICD-10-CM | POA: Diagnosis not present

## 2018-10-24 LAB — HEPATIC FUNCTION PANEL
ALBUMIN: 4.2 g/dL (ref 3.7–4.7)
ALT: 15 IU/L (ref 0–44)
AST: 19 IU/L (ref 0–40)
Alkaline Phosphatase: 51 IU/L (ref 39–117)
Bilirubin Total: 0.3 mg/dL (ref 0.0–1.2)
Bilirubin, Direct: 0.08 mg/dL (ref 0.00–0.40)
Total Protein: 6.6 g/dL (ref 6.0–8.5)

## 2018-10-24 LAB — LIPID PANEL
Chol/HDL Ratio: 3 ratio (ref 0.0–5.0)
Cholesterol, Total: 215 mg/dL — ABNORMAL HIGH (ref 100–199)
HDL: 71 mg/dL (ref >39)
LDL Calculated: 126 mg/dL — ABNORMAL HIGH (ref 0–99)
Triglycerides: 91 mg/dL (ref 0–149)
VLDL CHOLESTEROL CAL: 18 mg/dL (ref 5–40)

## 2018-10-24 NOTE — Progress Notes (Signed)
Daily Session Note  Patient Details  Name: Ryan Pittman MRN: [id number] Date of Birth: 04/29/1941 Referring Provider:     Cardiac Rehab from 09/23/2018 in [facility] Cardiac and Pulmonary Rehab  Referring Provider  [name] MD      Encounter Date: 10/24/2018  Check In: Session Check In - 10/24/18 0905      Check-In   Supervising physician immediately available to respond to emergencies  See telemetry face sheet for immediately available ER MD    Location  [facility]-Cardiac [facility] Pulmonary Rehab    Staff Present  Gerlene Burdock, RN, BSN;Harleigh Civello BS, Exercise Physiologist;Jessica [place]nell, MA, RCEP, CCRP, Exercise Physiologist    Medication changes reported      No    Fall or balance concerns reported     No    Tobacco Cessation  No Change    Warm-up and Cool-down  Performed as group-led instruction    Resistance Training Performed  Yes    VAD Patient?  No    PAD/SET Patient?  No      Pain Assessment   Currently in Pain?  No/denies    Multiple Pain Sites  No          Social History   Tobacco Use  Smoking Status Former Smoker  . Types: Cigarettes  . Last attempt to quit: 09/04/2008  . Years since quitting: 10.1  Smokeless Tobacco Former Systems developer  . Types: Chew  Tobacco Comment   HAs quit tobacco products 2009    Goals Met:  Independence with exercise equipment Exercise tolerated well No report of cardiac concerns or symptoms Strength training completed today  Goals Unmet:  Not Applicable  Comments: Pt able to follow exercise prescription today without complaint.  Will continue to monitor for progression.  Reviewed home exercise with pt today.  Pt plans to walk and use weights at home for exercise.  Reviewed THR, pulse, RPE, sign and symptoms, NTG use, and when to call 911 or MD.  Also discussed weather considerations and indoor options.  Pt voiced understanding.    Dr. Emily Filbert is Medical Director for [place] and [facility].

## 2018-10-29 ENCOUNTER — Encounter: Payer: Medicare Other | Admitting: *Deleted

## 2018-10-29 DIAGNOSIS — Z951 Presence of aortocoronary bypass graft: Secondary | ICD-10-CM | POA: Diagnosis not present

## 2018-10-29 NOTE — Progress Notes (Signed)
Daily Session Note  Patient Details  Name: Ryan Pittman MRN: [id number] Date of Birth: [birth year] Referring Provider:     Cardiac Rehab from 09/23/2018 in [facility] Cardiac and Pulmonary Rehab  Referring Provider  [name] MD      Encounter Date: 10/29/2018  Check In: Session Check In - 10/29/18 0910      Check-In   Supervising physician immediately available to respond to emergencies  See telemetry face sheet for immediately available ER MD    Location  [facility]Cardiac [facility]monary Rehab    Staff Present  Heath Lark, RN, BSN, CCRP;Caylin Raby [place], MA, RCEP, CCRP, Exercise Physiologist;Joseph [employer], [place], ACSM CEP, Exercise Physiologist    Medication changes reported      No    Fall or balance concerns reported     No    Warm-up and Cool-down  Performed as group-led instruction    Resistance Training Performed  Yes    VAD Patient?  No    PAD/SET Patient?  No      Pain Assessment   Currently in Pain?  No/denies          Social History   Tobacco Use  Smoking Status Former Smoker  . Types: Cigarettes  . Last attempt to quit: 09/04/2008  . Years since quitting: 10.1  Smokeless Tobacco Former Systems developer  . Types: Chew  Tobacco Comment   HAs quit tobacco products 2009    Goals Met:  Independence with exercise equipment Exercise tolerated well No report of cardiac concerns or symptoms Strength training completed today  Goals Unmet:  Not Applicable  Comments: Pt able to follow exercise prescription today without complaint.  Will continue to monitor for progression.    Dr. Emily Filbert is Medical Director for [place] and [facility].

## 2018-10-30 ENCOUNTER — Encounter: Payer: Self-pay | Admitting: *Deleted

## 2018-10-30 DIAGNOSIS — Z951 Presence of aortocoronary bypass graft: Secondary | ICD-10-CM

## 2018-10-30 NOTE — Progress Notes (Signed)
Cardiac Individual Treatment Plan  Patient Details  Name: Ryan Pittman MRN: [id number] Date of Birth: May 14, 1941 Referring Provider:     Cardiac Rehab from 09/23/2018 in [facility] Cardiac and Pulmonary Rehab  Referring Provider  [name] MD      Initial Encounter Date:    Cardiac Rehab from 09/23/2018 in [facility] Cardiac and Pulmonary Rehab  Date  09/23/18      Visit Diagnosis: S/P CABG x 3  Patient's Home Medications on Admission:  Current Outpatient Medications:  .  clopidogrel (PLAVIX) 75 MG tablet, TAKE ONE TABLET EVERY DAY, Disp: 90 tablet, Rfl: 3 .  Co-Enzyme Q-10 100 MG CAPS, Take by mouth., Disp: , Rfl:  .  digoxin (LANOXIN) 0.125 MG tablet, , Disp: , Rfl:  .  levothyroxine (SYNTHROID, LEVOTHROID) 75 MCG tablet, Take 75 mcg by mouth daily before breakfast. Takes 1.5 tabs every Thursday per pt, Disp: , Rfl:  .  metoprolol tartrate (LOPRESSOR) 25 MG tablet, Take 0.5 tablets (12.5 mg total) by mouth 2 (two) times daily., Disp: 30 tablet, Rfl: 10 .  Omega-3 Fatty Acids (FISH OIL OMEGA-3 PO), Take 1 capsule by mouth daily. , Disp: , Rfl:  .  pravastatin (PRAVACHOL) 40 MG tablet, Take 1 tablet (40 mg total) by mouth daily., Disp: 90 tablet, Rfl: 3 .  tamsulosin (FLOMAX) 0.4 MG CAPS capsule, Take 1 capsule (0.4 mg total) by mouth daily., Disp: 30 capsule, Rfl: 0 .  traZODone (DESYREL) 100 MG tablet, TAKE 1/2 TO 1 TABLET BY MOUTH AT BEDTIMEAS NEEDED FOR SLEEP, Disp: 30 tablet, Rfl: 12 .  vitamin C (ASCORBIC ACID) 500 MG tablet, Take 500 mg by mouth daily., Disp: , Rfl:   Past Medical History: Past Medical History:  Diagnosis Date  . Anemia    due to GIB, s/p transfusion  . Anginal pain ([place])   . Arthritis    back pain, much worse after consecutive golf rounds  . Cancer ([place])    thyroid  . Chronic kidney disease   . Colon polyps   . Coronary artery disease   . Dyspnea    walking up a hill  . GERD (gastroesophageal reflux disease)   . Hypercalcemia    h/o, resolved as  of 2012, prev due to high amount of calcium intake  . Hyperlipidemia   . Hypertension   . IgG gammopathy    stable as of 2012 per [facility]  . Pneumonia    as a child  . PVD (peripheral vascular disease) ([place])    L leg bypass, R leg stented    Tobacco Use: Social History   Tobacco Use  Smoking Status Former Smoker  . Types: Cigarettes  . Last attempt to quit: 09/04/2008  . Years since quitting: 10.1  Smokeless Tobacco Former Systems developer  . Types: Chew  Tobacco Comment   HAs quit tobacco products 2009    Labs: Recent Review Flowsheet Data    Labs for ITP Cardiac and Pulmonary Rehab Latest Ref Rng & Units 07/11/2018 07/11/2018 07/12/2018 07/14/2018 10/23/2018   Cholestrol 100 - 199 mg/dL - - - - 215(H)   LDLCALC 0 - 99 mg/dL - - - - 126(H)   LDLDIRECT mg/dL - - - - -   HDL >39 mg/dL - - - - 71   Trlycerides 0 - 149 mg/dL - - - - 91   Hemoglobin A1c 4.8 - 5.6 % - - - - -   PHART 7.350 - 7.450 7.342(L) - - - -   PCO2ART  32.0 - 48.0 mmHg 31.9(L) - - - -   HCO3 20.0 - 28.0 mmol/L 17.3(L) - - - -   TCO2 22 - 32 mmol/L 18(L) 19(L) 25 - -   ACIDBASEDEF 0.0 - 2.0 mmol/L 8.0(H) - - - -   O2SAT % 97.0 - - 56.3 -       Exercise Target Goals: Exercise Program Goal: Individual exercise prescription set using results from initial 6 min walk test and THRR while considering  patient's activity barriers and safety.   Exercise Prescription Goal: Initial exercise prescription builds to 30-45 minutes a day of aerobic activity, 2-3 days per week.  Home exercise guidelines will be given to patient during program as part of exercise prescription that the participant will acknowledge.  Activity Barriers & Risk Stratification: Activity Barriers & Cardiac Risk Stratification - 09/23/18 1235      Activity Barriers & Cardiac Risk Stratification   Activity Barriers  Arthritis;Back Problems;Joint Problems   Arthritis in hands/finger and back. Hand joints not flexible because of arthritis   Cardiac Risk  Stratification  High       6 Minute Walk: 6 Minute Walk    Row Name 09/23/18 1511         6 Minute Walk   Phase  Initial     Distance  1204 feet     Walk Time  6 minutes     # of Rest Breaks  0     MPH  2.28     METS  1.82     RPE  13     VO2 Peak  6.38     Symptoms  Yes (comment)     Comments  PAD  Claudication Pain Score 4/5     Resting HR  52 bpm     Resting BP  126/72     Resting Oxygen Saturation   96 %     Exercise Oxygen Saturation  during 6 min walk  99 %     Max Ex. HR  84 bpm     Max Ex. BP  154/84     2 Minute Post BP  142/64        Oxygen Initial Assessment:   Oxygen Re-Evaluation:   Oxygen Discharge (Final Oxygen Re-Evaluation):   Initial Exercise Prescription: Initial Exercise Prescription - 09/23/18 1500      Date of Initial Exercise RX and Referring Provider   Date  09/23/18    Referring Provider  [name] MD      Treadmill   MPH  2    Grade  0.5    Minutes  15    METs  2.67      Bike   Level  0.3    Minutes  15    METs  1.8      T5 Nustep   Level  1    SPM  80    Minutes  15    METs  2.5      Prescription Details   Frequency (times per week)  2    Duration  Progress to 30 minutes of continuous aerobic without signs/symptoms of physical distress      Intensity   THRR 40-80% of Max Heartrate  88-123    Ratings of Perceived Exertion  11-13    Perceived Dyspnea  0-4      Progression   Progression  Continue to progress workloads to maintain intensity without signs/symptoms of physical distress.  Resistance Training   Training Prescription  Yes    Weight  3 lbs    Reps  10-15       Perform Capillary Blood Glucose checks as needed.  Exercise Prescription Changes: Exercise Prescription Changes    Row Name 09/23/18 1200 10/08/18 1300 10/23/18 0900 10/24/18 1000       Response to Exercise   Blood Pressure (Admit)  126/72  106/60  126/60  -    Blood Pressure (Exercise)  154/84  150/78  132/62  -    Blood  Pressure (Exit)  142/64  128/60  142/62  -    Heart Rate (Admit)  52 bpm  60 bpm  74 bpm  -    Heart Rate (Exercise)  84 bpm  116 bpm  109 bpm  -    Heart Rate (Exit)  54 bpm  56 bpm  66 bpm  -    Oxygen Saturation (Admit)  96 %  -  -  -    Oxygen Saturation (Exercise)  99 %  -  -  -    Rating of Perceived Exertion (Exercise)  _0 -    Symptoms  CPS 4/5  none  none  -    Comments  walk test results  -  -  -    Duration  -  Continue with 30 min of aerobic exercise without signs/symptoms of physical distress.  Continue with 30 min of aerobic exercise without signs/symptoms of physical distress.  -    Intensity  -  THRR unchanged  THRR unchanged  -      Progression   Progression  -  Continue to progress workloads to maintain intensity without signs/symptoms of physical distress.  Continue to progress workloads to maintain intensity without signs/symptoms of physical distress.  -    Average METs  -  1.78  3.12  -      Resistance Training   Training Prescription  -  Yes  Yes  -    Weight  -  3 lbs  4 lbs  -    Reps  -  10-15  10-15  -      Interval Training   Interval Training  -  No  No  -      Treadmill   MPH  -  1  2  -    Grade  -  0  0.5  -    Minutes  -  15  15  -    METs  -  1.77  2.67  -      Bike   Level  -  -  1  -    Minutes  -  -  35  -    METs  -  -  3.57  -      T5 Nustep   Level  -  1  -  -    Minutes  -  15  -  -    METs  -  1.8  -  -      Home Exercise Plan   Plans to continue exercise at  -  -  -  Home (comment) walking    Frequency  -  -  -  Add 3 additional days to program exercise sessions.    Initial Home Exercises Provided  -  -  -  10/24/18       Exercise Comments: Exercise Comments  Row Name 10/01/18 0932           Exercise Comments  :First full day of exercise!  Patient was oriented to gym and equipment including functions, settings, policies, and procedures.  Patient's individual exercise prescription and treatment plan were  reviewed.  All starting workloads were established based on the results of the 6 minute walk test done at initial orientation visit.  The plan for exercise progression was also introduced and progression will be customized based on patient's performance and goals.          Exercise Goals and Review: Exercise Goals    Row Name 09/23/18 1517             Exercise Goals   Increase Physical Activity  Yes       Intervention  Provide advice, education, support and counseling about physical activity/exercise needs.;Develop an individualized exercise prescription for aerobic and resistive training based on initial evaluation findings, risk stratification, comorbidities and participant's personal goals.       Expected Outcomes  Short Term: Attend rehab on a regular basis to increase amount of physical activity.;Long Term: Add in home exercise to make exercise part of routine and to increase amount of physical activity.;Long Term: Exercising regularly at least 3-5 days a week.       Increase Strength and Stamina  Yes       Intervention  Provide advice, education, support and counseling about physical activity/exercise needs.;Develop an individualized exercise prescription for aerobic and resistive training based on initial evaluation findings, risk stratification, comorbidities and participant's personal goals.       Expected Outcomes  Short Term: Increase workloads from initial exercise prescription for resistance, speed, and METs.;Short Term: Perform resistance training exercises routinely during rehab and add in resistance training at home;Long Term: Improve cardiorespiratory fitness, muscular endurance and strength as measured by increased METs and functional capacity (6MWT)       Able to understand and use rate of perceived exertion (RPE) scale  Yes       Intervention  Provide education and explanation on how to use RPE scale       Expected Outcomes  Short Term: Able to use RPE daily in rehab to  express subjective intensity level;Long Term:  Able to use RPE to guide intensity level when exercising independently       Knowledge and understanding of Target Heart Rate Range (THRR)  Yes       Intervention  Provide education and explanation of THRR including how the numbers were predicted and where they are located for reference       Expected Outcomes  Short Term: Able to state/look up THRR;Short Term: Able to use daily as guideline for intensity in rehab;Long Term: Able to use THRR to govern intensity when exercising independently       Able to check pulse independently  Yes       Intervention  Provide education and demonstration on how to check pulse in carotid and radial arteries.;Review the importance of being able to check your own pulse for safety during independent exercise       Expected Outcomes  Short Term: Able to explain why pulse checking is important during independent exercise;Long Term: Able to check pulse independently and accurately       Understanding of Exercise Prescription  Yes       Intervention  Provide education, explanation, and written materials on patient's individual exercise prescription       Expected Outcomes  Short Term: Able to explain program exercise prescription;Long Term: Able to explain home exercise prescription to exercise independently          Exercise Goals Re-Evaluation : Exercise Goals Re-Evaluation    Row Name 10/01/18 0932 10/08/18 1337 10/23/18 0919 10/24/18 1059       Exercise Goal Re-Evaluation   Exercise Goals Review  Increase Physical Activity;Increase Strength and Stamina;Able to understand and use rate of perceived exertion (RPE) scale;Knowledge and understanding of Target Heart Rate Range (THRR);Understanding of Exercise Prescription  Increase Physical Activity;Increase Strength and Stamina;Understanding of Exercise Prescription  Increase Physical Activity;Increase Strength and Stamina;Understanding of Exercise Prescription  Increase  Physical Activity;Increase Strength and Stamina;Understanding of Exercise Prescription;Able to understand and use rate of perceived exertion (RPE) scale;Knowledge and understanding of Target Heart Rate Range (THRR);Able to check pulse independently    Comments  Reviewed RPE scale, THR and program prescription with pt today.  Pt voiced understanding and was given a copy of goals to take home.   Navid has completed his first two full days of exericse. He has done well so far.  We will continue to montior his progress.   Elio has been doing well in rehab.  He is up to 2.0 mph on treadmill.  We will continue to monitor his progress.  Reviewed home exercise with pt today.  Pt plans to walk and use weights at home for exercise.  Reviewed THR, pulse, RPE, sign and symptoms, NTG use, and when to call 911 or MD.  Also discussed weather considerations and indoor options.  Pt voiced understanding.    Expected Outcomes  Short: Use RPE daily to regulate intensity. Long: Follow program prescription in THR.  Short: Continue to attend class regularly.  Long: Continue to follow program prescription.   Short: Increase workloads on seated equipment.  Long:  Continue to build strength and stamina  Short: Start to add in exercise at home.  Long: Continue to build strength and stamina.        Discharge Exercise Prescription (Final Exercise Prescription Changes): Exercise Prescription Changes - 10/24/18 1000      Home Exercise Plan   Plans to continue exercise at  Home (comment)   walking   Frequency  Add 3 additional days to program exercise sessions.    Initial Home Exercises Provided  10/24/18       Nutrition:  Target Goals: Understanding of nutrition guidelines, daily intake of sodium <1570m, cholesterol <2078m calories 30% from fat and 7% or less from saturated fats, daily to have 5 or more servings of fruits and vegetables.  Biometrics: Pre Biometrics - 09/23/18 1517      Pre Biometrics   Height  5'  6.1" (1.679 m)    Weight  154 lb 6.4 oz (70 kg)    Waist Circumference  37.5 inches    Hip Circumference  36.5 inches    Waist to Hip Ratio  1.03 %    BMI (Calculated)  24.84    Single Leg Stand  8.01 seconds        Nutrition Therapy Plan and Nutrition Goals: Nutrition Therapy & Goals - 09/23/18 1240      Intervention Plan   Intervention  Prescribe, educate and counsel regarding individualized specific dietary modifications aiming towards targeted core components such as weight, hypertension, lipid management, diabetes, heart failure and other comorbidities.    Expected Outcomes  Short Term Goal: Understand basic principles of dietary content, such as calories, fat, sodium, cholesterol and nutrients.;Short Term  Goal: A plan has been developed with personal nutrition goals set during dietitian appointment.;Long Term Goal: Adherence to prescribed nutrition plan.       Nutrition Assessments: Nutrition Assessments - 09/23/18 1240      MEDFICTS Scores   Pre Score  12       Nutrition Goals Re-Evaluation: Nutrition Goals Re-Evaluation    Row Name 10/10/18 1019             Goals   Current Weight  151 lb (68.5 kg)       Nutrition Goal  Eat healthier       Comment  Patient has been eating less red meats and fried foods. He drinks plenty of water throughout the day. Henry will try not to overeat when he skips a meal and do better on portion control.       Expected Outcome  Short: decrease portion size. Long: lose a few pounds and maintain weight.          Nutrition Goals Discharge (Final Nutrition Goals Re-Evaluation): Nutrition Goals Re-Evaluation - 10/10/18 1019      Goals   Current Weight  151 lb (68.5 kg)    Nutrition Goal  Eat healthier    Comment  Patient has been eating less red meats and fried foods. He drinks plenty of water throughout the day. Lucia will try not to overeat when he skips a meal and do better on portion control.    Expected Outcome  Short: decrease  portion size. Long: lose a few pounds and maintain weight.       Psychosocial: Target Goals: Acknowledge presence or absence of significant depression and/or stress, maximize coping skills, provide positive support system. Participant is able to verbalize types and ability to use techniques and skills needed for reducing stress and depression.   Initial Review & Psychosocial Screening: Initial Psych Review & Screening - 09/23/18 1238      Initial Review   Current issues with  None Identified   Does have restless leg syndrome. Some anxiety with it.     Family Dynamics   Good Support System?  Yes   spouse     Barriers   Psychosocial barriers to participate in program  There are no identifiable barriers or psychosocial needs.;The patient should benefit from training in stress management and relaxation.      Screening Interventions   Interventions  Encouraged to exercise;To provide support and resources with identified psychosocial needs;Provide feedback about the scores to participant    Expected Outcomes  Short Term goal: Utilizing psychosocial counselor, staff and physician to assist with identification of specific Stressors or current issues interfering with healing process. Setting desired goal for each stressor or current issue identified.;Long Term Goal: Stressors or current issues are controlled or eliminated.;Short Term goal: Identification and review with participant of any Quality of Life or Depression concerns found by scoring the questionnaire.;Long Term goal: The participant improves quality of Life and PHQ9 Scores as seen by post scores and/or verbalization of changes       Quality of Life Scores:  Quality of Life - 09/23/18 1239      Quality of Life   Select  Quality of Life      Quality of Life Scores   Health/Function Pre  28.2 %    Socioeconomic Pre  30 %    Psych/Spiritual Pre  30 %    Family Pre  27.6 %    GLOBAL Pre  28.85 %  Scores of 19 and below  usually indicate a poorer quality of life in these areas.  A difference of  2-3 points is a clinically meaningful difference.  A difference of 2-3 points in the total score of the Quality of Life Index has been associated with significant improvement in overall quality of life, self-image, physical symptoms, and general health in studies assessing change in quality of life.  PHQ-9: Recent Review Flowsheet Data    Depression screen [facility] 2/9 09/23/2018 03/27/2018 03/22/2017 12/24/2015   Decreased Interest 0 0 0 0   Down, Depressed, Hopeless 0 0 0 0   PHQ - 2 Score 0 0 0 0   Altered sleeping 0 0 - -   Tired, decreased energy 1 0 - -   Change in appetite 0 0 - -   Feeling bad or failure about yourself  0 0 - -   Trouble concentrating 0 0 - -   Moving slowly or fidgety/restless 0 0 - -   Suicidal thoughts 0 0 - -   PHQ-9 Score 1 0 - -   Difficult doing work/chores Somewhat difficult Not difficult at all - -     Interpretation of Total Score  Total Score Depression Severity:  1-4 = Minimal depression, 5-9 = Mild depression, 10-14 = Moderate depression, 15-19 = Moderately severe depression, 20-27 = Severe depression   Psychosocial Evaluation and Intervention: Psychosocial Evaluation - 10/17/18 1457      Psychosocial Evaluation & Interventions   Interventions  Stress management education;Encouraged to exercise with the program and follow exercise prescription    Comments  Counselor met with Mr. Langdon Delfino Lovett) today for initial psychosocial evaluation.  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  He has a strong suport system with a spouse, son and daughter locally and active involvement in his local church.  Davidmichael reports trouble getting  to sleep but typically sleeps 7-8 hours.  He has a good appetite.  Constant denies a history of depression or anxiety or any current symptoms and is typically in a positive mood.  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  He has goals to just "get through this program" and plans to continue exercising at home.  Staff will follow with him.     Expected Outcomes  Short:  Dillon will exercise for his health and as a positive coping strategy for stress.  Long:  Yannis will develop positive  lifestyle habits for his health and mental health.     Continue Psychosocial Services   Follow up required by staff       Psychosocial Re-Evaluation: Psychosocial Re-Evaluation    Gu-Win Name 10/10/18 1015             Psychosocial Re-Evaluation   Current issues with  Current Stress Concerns       Comments  Ladislav has a business at [employer]. Taxes are coming around and is stressful. He had a bypass and feels positive about his outcome.        Expected Outcomes  Short: Attend [facility] regularly. Long: maintain a positive outlook on his health.       Interventions  Encouraged to attend Cardiac Rehabilitation for the exercise;Stress management education       Continue Psychosocial Services   Follow up required by counselor          Psychosocial Discharge (Final Psychosocial  Re-Evaluation): Psychosocial Re-Evaluation - 10/10/18 1015      Psychosocial Re-Evaluation   Current issues with  Current Stress Concerns    Comments  Burdell has a business at [employer]. Taxes are coming around and is stressful. He had a bypass and feels positive about his outcome.     Expected Outcomes  Short: Attend [facility] regularly. Long: maintain a positive outlook on his health.    Interventions  Encouraged to attend Cardiac Rehabilitation for the exercise;Stress management education    Continue Psychosocial Services   Follow up required by counselor       Vocational Rehabilitation: Provide vocational rehab assistance to qualifying candidates.   Vocational Rehab Evaluation & Intervention: Vocational Rehab - 09/23/18 1241      Initial Vocational Rehab Evaluation & Intervention   Assessment shows  need for Vocational Rehabilitation  No       Education: Education Goals: Education classes will be provided on a variety of topics geared toward better understanding of heart health and risk factor modification. Participant will state understanding/return demonstration of topics presented as noted by education test scores.  Learning Barriers/Preferences: Learning Barriers/Preferences - 09/23/18 1240      Learning Barriers/Preferences   Learning Barriers  None    Learning Preferences  Individual Instruction       Education Topics:  AED/CPR: - Group verbal and written instruction with the use of models to demonstrate the basic use of the AED with the basic ABC's of resuscitation.   Cardiac Rehab from 10/29/2018 in [facility] Cardiac and Pulmonary Rehab  Date  10/29/18  Educator  SB  Instruction Review Code  1- Verbalizes Understanding      General Nutrition Guidelines/Fats and Fiber: -Group instruction provided by verbal, written material, models and posters to present the general guidelines for heart healthy nutrition. Gives an explanation and review of dietary fats and fiber.   Controlling Sodium/Reading Food Labels: -Group verbal and written material supporting the discussion of sodium use in heart healthy nutrition. Review and explanation with models, verbal and written materials for utilization of the food label.   Exercise Physiology & General Exercise Guidelines: - Group verbal and written instruction with models to review the exercise physiology of the cardiovascular system and associated critical values. Provides general exercise guidelines with specific guidelines to those with heart or lung disease.    Aerobic Exercise & Resistance Training: - Gives group verbal and written instruction on the various components of exercise. Focuses on aerobic and resistive training programs and the benefits of this training and how to safely progress through these  programs..   Flexibility, Balance, Mind/Body Relaxation: Provides group verbal/written instruction on the benefits of flexibility and balance training, including mind/body exercise modes such as yoga, pilates and tai chi.  Demonstration and skill practice provided.   Cardiac Rehab from 10/29/2018 in [facility] Cardiac and Pulmonary Rehab  Date  10/01/18  Educator  AS  Instruction Review Code  1- Verbalizes Understanding      Stress and Anxiety: - Provides group verbal and written instruction about the health risks of elevated stress and causes of high stress.  Discuss the correlation between heart/lung disease and anxiety and treatment options. Review healthy ways to manage with stress and anxiety.   Cardiac Rehab from 10/29/2018 in [facility] Cardiac and Pulmonary Rehab  Date  10/22/18  Educator  [facility]  Instruction Review Code  1- Verbalizes Understanding      Depression: - Provides group verbal and written instruction on the correlation between  heart/lung disease and depressed mood, treatment options, and the stigmas associated with seeking treatment.   Anatomy & Physiology of the Heart: - Group verbal and written instruction and models provide basic cardiac anatomy and physiology, with the coronary electrical and arterial systems. Review of Valvular disease and Heart Failure   Cardiac Rehab from 10/29/2018 in [facility] Cardiac and Pulmonary Rehab  Date  10/10/18  Educator  CE  Instruction Review Code  1- Verbalizes Understanding      Cardiac Procedures: - Group verbal and written instruction to review commonly prescribed medications for heart disease. Reviews the medication, class of the drug, and side effects. Includes the steps to properly store meds and maintain the prescription regimen. (beta blockers and nitrates)   Cardiac Rehab from 10/29/2018 in [facility] Cardiac and Pulmonary Rehab  Date  10/24/18  Educator  CE  Instruction Review Code  1- Verbalizes Understanding      Cardiac Medications  I: - Group verbal and written instruction to review commonly prescribed medications for heart disease. Reviews the medication, class of the drug, and side effects. Includes the steps to properly store meds and maintain the prescription regimen.   Cardiac Rehab from 10/29/2018 in [facility] Cardiac and Pulmonary Rehab  Date  10/15/18  Educator  SB  Instruction Review Code  1- Verbalizes Understanding      Cardiac Medications II: -Group verbal and written instruction to review commonly prescribed medications for heart disease. Reviews the medication, class of the drug, and side effects. (all other drug classes)   Cardiac Rehab from 10/29/2018 in [facility] Cardiac and Pulmonary Rehab  Date  10/03/18  Educator  CE  Instruction Review Code  1- Verbalizes Understanding       Go Sex-Intimacy & Heart Disease, Get SMART - Goal Setting: - Group verbal and written instruction through game format to discuss heart disease and the return to sexual intimacy. Provides group verbal and written material to discuss and apply goal setting through the application of the S.M.A.R.T. Method.   Cardiac Rehab from 10/29/2018 in [facility] Cardiac and Pulmonary Rehab  Date  10/24/18  Educator  CE  Instruction Review Code  1- Verbalizes Understanding      Other Matters of the Heart: - Provides group verbal, written materials and models to describe Stable Angina and Peripheral Artery. Includes description of the disease process and treatment options available to the cardiac patient.   Cardiac Rehab from 10/29/2018 in [facility] Cardiac and Pulmonary Rehab  Date  10/10/18  Educator  CE  Instruction Review Code  1- Verbalizes Understanding      Exercise & Equipment Safety: - Individual verbal instruction and demonstration of equipment use and safety with use of the equipment.   Cardiac Rehab from 10/29/2018 in [facility] Cardiac and Pulmonary Rehab  Date  09/23/18  Educator  [facility]  Instruction Review Code  1- Verbalizes Understanding       Infection Prevention: - Provides verbal and written material to individual with discussion of infection control including proper hand washing and proper equipment cleaning during exercise session.   Cardiac Rehab from 10/29/2018 in [facility] Cardiac and Pulmonary Rehab  Date  09/23/18  Educator  [facility]  Instruction Review Code  1- Verbalizes Understanding      Falls Prevention: - Provides verbal and written material to individual with discussion of falls prevention and safety.   Cardiac Rehab from 10/29/2018 in [facility] Cardiac and Pulmonary Rehab  Date  09/23/18  Educator  [facility]  Instruction Review Code  1- [employer]  Understanding      Diabetes: - Individual verbal and written instruction to review signs/symptoms of diabetes, desired ranges of glucose level fasting, after meals and with exercise. Acknowledge that pre and post exercise glucose checks will be done for 3 sessions at entry of program.   Know Your Numbers and Risk Factors: -Group verbal and written instruction about important numbers in your health.  Discussion of what are risk factors and how they play a role in the disease process.  Review of Cholesterol, Blood Pressure, Diabetes, and BMI and the role they play in your overall health.   Cardiac Rehab from 10/29/2018 in [facility] Cardiac and Pulmonary Rehab  Date  10/03/18  Educator  CE  Instruction Review Code  1- Verbalizes Understanding      Sleep Hygiene: -Provides group verbal and written instruction about how sleep can affect your health.  Define sleep hygiene, discuss sleep cycles and impact of sleep habits. Review good sleep hygiene tips.    Other: -Provides group and verbal instruction on various topics (see comments)   Knowledge Questionnaire Score: Knowledge Questionnaire Score - 09/23/18 1241      Knowledge Questionnaire Score   Pre Score  20/26   Reviewed correct responses with Torsten today. He verbalized understanding of the responses and had no further  questions today.       Core Components/Risk Factors/Patient Goals at Admission: Personal Goals and Risk Factors at Admission - 09/23/18 1241      Core Components/Risk Factors/Patient Goals on Admission   Hypertension  Yes    Intervention  Provide education on lifestyle modifcations including regular physical activity/exercise, weight management, moderate sodium restriction and increased consumption of fresh fruit, vegetables, and low fat dairy, alcohol moderation, and smoking cessation.;Monitor prescription use compliance.    Expected Outcomes  Short Term: Continued assessment and intervention until BP is < 140/89m HG in hypertensive participants. < 130/851mHG in hypertensive participants with diabetes, heart failure or chronic kidney disease.;Long Term: Maintenance of blood pressure at goal levels.    Lipids  Yes    Intervention  Provide education and support for participant on nutrition & aerobic/resistive exercise along with prescribed medications to achieve LDL <7066mHDL >45m57m  Expected Outcomes  Short Term: Participant states understanding of desired cholesterol values and is compliant with medications prescribed. Participant is following exercise prescription and nutrition guidelines.;Long Term: Cholesterol controlled with medications as prescribed, with individualized exercise RX and with personalized nutrition plan. Value goals: LDL < 70mg21mL > 40 mg.       Core Components/Risk Factors/Patient Goals Review:  Goals and Risk Factor Review    Row Name 10/10/18 1024             Core Components/Risk Factors/Patient Goals Review   Personal Goals Review  Lipids;Hypertension       Review  Patient states that he has not had his cholesterol checked since his surgery and is getting it checked next week. He will report the values back when he finds out. He is eating better and hopes that his values are good. His blood pressure ranges from the 130's to higher 50's.        Expected  Outcomes  Short: get lipids checked. Long: maintain lipids within normal range.          Core Components/Risk Factors/Patient Goals at Discharge (Final Review):  Goals and Risk Factor Review - 10/10/18 1024      Core Components/Risk Factors/Patient Goals Review   Personal Goals Review  Lipids;Hypertension    Review  Patient states that he has not had his cholesterol checked since his surgery and is getting it checked next week. He will report the values back when he finds out. He is eating better and hopes that his values are good. His blood pressure ranges from the 130's to higher 50's.     Expected Outcomes  Short: get lipids checked. Long: maintain lipids within normal range.       ITP Comments: ITP Comments    Row Name 09/23/18 1224 10/02/18 0940 10/30/18 0601       ITP Comments  Medical review completed today. ITP sent to Dr Loleta Chance for review,changes as needed and signature. Documentation of diagnosis can be found in [facility] 07/11/2018  30 Day Review. Continue with ITP unless directed changes per Medical Director review.  30 Day Review. Continue with ITP unless directed changes per Medical Director review.        Comments:

## 2018-10-31 DIAGNOSIS — Z951 Presence of aortocoronary bypass graft: Secondary | ICD-10-CM | POA: Diagnosis not present

## 2018-10-31 NOTE — Progress Notes (Signed)
Daily Session Note  Patient Details  Name: Ryan Pittman MRN: [id number] Date of Birth: December 27, 1940 Referring Provider:     Cardiac Rehab from 09/23/2018 in [facility] Cardiac and Pulmonary Rehab  Referring Provider  [name] MD      Encounter Date: 10/31/2018  Check In: Session Check In - 10/31/18 0915      Check-In   Supervising physician immediately available to respond to emergencies  See telemetry face sheet for immediately available ER MD    Location  [facility]-Cardiac & Pulmonary Rehab    Staff Present  Justin Mend RCP,RRT,BSRT;Jessica Luan Pulling, MA, RCEP, CCRP, Exercise Physiologist;Carroll Enterkin, RN, BSN;Jeanna Durrell BS, Exercise Physiologist    Medication changes reported      No    Fall or balance concerns reported     No    Warm-up and Cool-down  Performed as group-led instruction    Resistance Training Performed  Yes    VAD Patient?  No    PAD/SET Patient?  No      Pain Assessment   Currently in Pain?  No/denies          Social History   Tobacco Use  Smoking Status Former Smoker  . Types: Cigarettes  . Last attempt to quit: 09/04/2008  . Years since quitting: 10.1  Smokeless Tobacco Former Systems developer  . Types: Chew  Tobacco Comment   HAs quit tobacco products 2009    Goals Met:  Independence with exercise equipment Exercise tolerated well No report of cardiac concerns or symptoms Strength training completed today  Goals Unmet:  Not Applicable  Comments: Pt able to follow exercise prescription today without complaint.  Will continue to monitor for progression.    Dr. Emily Filbert is Medical Director for [place] and [facility].

## 2018-11-04 ENCOUNTER — Telehealth: Payer: Self-pay

## 2018-11-04 NOTE — Telephone Encounter (Signed)
Ryan Pittman [facility] signed) they are returning from beach later this afternoon after 3:30 (tentative timing) offered appt today at 4pm with Glenda Chroman FNP but not positive about return to town time and also prefers to see Dr Damita Dunnings.  Pt has prod cough with dark phlegm,? Fever, pt feels warm;No S/T, wheezing or SOB different from what pt normally has. Ryan Pittman scheduled appt with Dr Damita Dunnings on 11/05/18 at 9:30. If pt condition changes or worsens prior to appt and if it during the day Ryan Lindon will call [facility] otherwise pt would go to [facility] if needed. FYI to Dr Damita Dunnings.

## 2018-11-04 NOTE — Telephone Encounter (Signed)
Noted. Thanks.

## 2018-11-05 ENCOUNTER — Encounter: Payer: Self-pay | Admitting: Family Medicine

## 2018-11-05 ENCOUNTER — Encounter: Payer: Medicare Other | Attending: Cardiovascular Disease

## 2018-11-05 ENCOUNTER — Ambulatory Visit (INDEPENDENT_AMBULATORY_CARE_PROVIDER_SITE_OTHER): Payer: Medicare Other | Admitting: Family Medicine

## 2018-11-05 DIAGNOSIS — B349 Viral infection, unspecified: Secondary | ICD-10-CM

## 2018-11-05 DIAGNOSIS — Z951 Presence of aortocoronary bypass graft: Secondary | ICD-10-CM | POA: Insufficient documentation

## 2018-11-05 NOTE — Patient Instructions (Signed)
Likely a benign viral illness that should resolve.  Rest and fluids.  Take plain mucinex with plenty of water, not mucinex-D.  Back to cardiac rehab when cough is better.  Update me as needed.  Take care.  Glad to see you.

## 2018-11-05 NOTE — Progress Notes (Signed)
Sx started about 2-3 days ago.  Cough, some sputum, a little discolored.  Rhinorrhea.  No vomiting, no diarrhea.  No fevers.  Not SOB.  He can still get a deep breath.  Some occ wheeze noted.  No facial pain, no ST, no ear pain.  He feels better today.    Had been in cardiac rehab.  Discussed with patient about gradual return to cardiac rehab when he is feeling back to baseline.  Meds, vitals, and allergies reviewed.   ROS: Per HPI unless specifically indicated in ROS section   GEN: nad, alert and oriented HEENT: mucous membranes moist, tm w/o erythema, L SOM noted, nasal exam w/o erythema, clear discharge noted,  OP with cobblestoning NECK: supple w/o LA CV: rrr.   PULM: ctab, no inc wob EXT: no edema SKIN: no acute rash

## 2018-11-06 DIAGNOSIS — B349 Viral infection, unspecified: Secondary | ICD-10-CM | POA: Insufficient documentation

## 2018-11-06 NOTE — Assessment & Plan Note (Signed)
Likely viral syndrome, but likely not flu.  He feels better today.   Rest and fluids.  Take plain mucinex with plenty of water, not mucinex-D.  Back to cardiac rehab when cough is better.  Should continue to improve.  Update me as needed.  He agrees.  Okay for outpatient follow-up.

## 2018-11-12 ENCOUNTER — Encounter: Payer: Medicare Other | Admitting: *Deleted

## 2018-11-12 DIAGNOSIS — Z951 Presence of aortocoronary bypass graft: Secondary | ICD-10-CM

## 2018-11-12 NOTE — Progress Notes (Signed)
Daily Session Note  Patient Details  Name: IVAAN LIDDY MRN: [id number] Date of Birth: [birth year] Referring Provider:     Cardiac Rehab from 09/23/2018 in [facility] Cardiac and Pulmonary Rehab  Referring Provider  [name] MD      Encounter Date: 11/12/2018  Check In: Session Check In - 11/12/18 0915      Check-In   Supervising physician immediately available to respond to emergencies  See telemetry face sheet for immediately available ER MD    Location  [facility]-Cardiac & Pulmonary Rehab    Staff Present  Heath Lark, RN, BSN, CCRP;Meredith Sherryll Burger, RN BSN;Jeanna Durrell BS, Exercise Physiologist;Layne Dilauro [place], MA, RCEP, CCRP, Exercise Physiologist;Joseph Tessie Fass RCP,RRT,BSRT    Medication changes reported      No    Fall or balance concerns reported     No    Warm-up and Cool-down  Performed as group-led instruction    Resistance Training Performed  Yes    VAD Patient?  No    PAD/SET Patient?  No      Pain Assessment   Currently in Pain?  No/denies          Social History   Tobacco Use  Smoking Status Former Smoker  . Types: Cigarettes  . Last attempt to quit: 09/04/2008  . Years since quitting: 10.1  Smokeless Tobacco Former Systems developer  . Types: Chew  Tobacco Comment   HAs quit tobacco products 2009    Goals Met:  Independence with exercise equipment Exercise tolerated well Personal goals reviewed No report of cardiac concerns or symptoms Strength training completed today  Goals Unmet:  Not Applicable  Comments: Pt able to follow exercise prescription today without complaint.  Will continue to monitor for progression.    Dr. Emily Filbert is Medical Director for [place] and [facility].

## 2018-11-14 ENCOUNTER — Other Ambulatory Visit: Payer: Self-pay

## 2018-11-14 DIAGNOSIS — N183 Chronic kidney disease, stage 3 (moderate): Secondary | ICD-10-CM | POA: Diagnosis not present

## 2018-11-14 DIAGNOSIS — I10 Essential (primary) hypertension: Secondary | ICD-10-CM | POA: Diagnosis not present

## 2018-11-14 DIAGNOSIS — I701 Atherosclerosis of renal artery: Secondary | ICD-10-CM | POA: Diagnosis not present

## 2018-11-14 DIAGNOSIS — Z951 Presence of aortocoronary bypass graft: Secondary | ICD-10-CM

## 2018-11-14 DIAGNOSIS — D631 Anemia in chronic kidney disease: Secondary | ICD-10-CM | POA: Diagnosis not present

## 2018-11-14 NOTE — Progress Notes (Signed)
Daily Session Note  Patient Details  Name: Ryan Pittman MRN: [id number] Date of Birth: [birth year] Referring Provider:     Cardiac Rehab from 09/23/2018 in [facility] Cardiac and Pulmonary Rehab  Referring Provider  [name] MD      Encounter Date: 11/14/2018  Check In: Session Check In - 11/14/18 1227      Check-In   Supervising physician immediately available to respond to emergencies  See telemetry face sheet for immediately available ER MD    Location  [facility]-Cardiac & Pulmonary Rehab    Staff Present  Justin Mend Lorre Nick, MA, RCEP, CCRP, Exercise Physiologist;Susanne Bice, RN, BSN, CCRP    Medication changes reported      No    Fall or balance concerns reported     No    Warm-up and Cool-down  Performed as group-led instruction    Resistance Training Performed  Yes    VAD Patient?  No    PAD/SET Patient?  No      Pain Assessment   Currently in Pain?  No/denies          Social History   Tobacco Use  Smoking Status Former Smoker  . Types: Cigarettes  . Last attempt to quit: 09/04/2008  . Years since quitting: 10.2  Smokeless Tobacco Former Systems developer  . Types: Chew  Tobacco Comment   HAs quit tobacco products 2009    Goals Met:  Independence with exercise equipment Exercise tolerated well No report of cardiac concerns or symptoms Strength training completed today  Goals Unmet:  Not Applicable  Comments: Pt able to follow exercise prescription today without complaint.  Will continue to monitor for progression.    Dr. Emily Filbert is Medical Director for [place] [facility].

## 2018-11-19 ENCOUNTER — Ambulatory Visit (INDEPENDENT_AMBULATORY_CARE_PROVIDER_SITE_OTHER): Payer: Medicare Other | Admitting: Pharmacist Clinician (PhC)/ Clinical Pharmacy Specialist

## 2018-11-19 ENCOUNTER — Other Ambulatory Visit: Payer: Self-pay

## 2018-11-19 DIAGNOSIS — E782 Mixed hyperlipidemia: Secondary | ICD-10-CM | POA: Diagnosis not present

## 2018-11-19 NOTE — Patient Instructions (Signed)
We will start paperwork to get Repatha approved by your insurance company.  Evolocumab injection What is this medicine? EVOLOCUMAB (e voe LOK ue mab) is known as a PCSK9 inhibitor. It is used to lower the level of cholesterol in the blood. It may be used alone or in combination with other cholesterol-lowering drugs. This drug may also be used to reduce the risk of heart attack, stroke, and certain types of heart surgery in patients with heart disease. This medicine may be used for other purposes; ask your health care provider or pharmacist if you have questions. COMMON BRAND NAME(S): Repatha What should I tell my health care provider before I take this medicine? They need to know if you have any of these conditions: -an unusual or allergic reaction to evolocumab, other medicines, foods, dyes, or preservatives -pregnant or trying to get pregnant -breast-feeding How should I use this medicine? This medicine is for injection under the skin. You will be taught how to prepare and give this medicine. Use exactly as directed. Take your medicine at regular intervals. Do not take your medicine more often than directed. It is important that you put your used needles and syringes in a special sharps container. Do not put them in a trash can. If you do not have a sharps container, call your pharmacist or health care provider to get one. Talk to your pediatrician regarding the use of this medicine in children. While this drug may be prescribed for children as young as 13 years for selected conditions, precautions do apply. Overdosage: If you think you have taken too much of this medicine contact a poison control center or emergency room at once. NOTE: This medicine is only for you. Do not share this medicine with others. What if I miss a dose? If you miss a dose, take it as soon as you can if there are more than 7 days until the next scheduled dose, or skip the missed dose and take the next dose according to  your original schedule. Do not take double or extra doses. What may interact with this medicine? Interactions are not expected. This list may not describe all possible interactions. Give your health care provider a list of all the medicines, herbs, non-prescription drugs, or dietary supplements you use. Also tell them if you smoke, drink alcohol, or use illegal drugs. Some items may interact with your medicine. What should I watch for while using this medicine? You may need blood work while you are taking this medicine. What side effects may I notice from receiving this medicine? Side effects that you should report to your doctor or health care professional as soon as possible: -allergic reactions like skin rash, itching or hives, swelling of the face, lips, or tongue -signs and symptoms of high blood sugar such as dizziness; dry mouth; dry skin; fruity breath; nausea; stomach pain; increased hunger or thirst; increased urination -signs and symptoms of infection like fever or chills; cough; sore throat; pain or trouble passing urine Side effects that usually do not require medical attention (report to your doctor or health care professional if they continue or are bothersome): -diarrhea -nausea -muscle pain -pain, redness, or irritation at site where injected This list may not describe all possible side effects. Call your doctor for medical advice about side effects. You may report side effects to FDA at 1-800-FDA-1088. Where should I keep my medicine? Keep out of the reach of children. You will be instructed on how to store this medicine. Throw away  any unused medicine after the expiration date on the label. NOTE: This sheet is a summary. It may not cover all possible information. If you have questions about this medicine, talk to your doctor, pharmacist, or health care provider.  2019 Elsevier/Gold Standard (2017-04-02 13:31:00)

## 2018-11-19 NOTE — Progress Notes (Signed)
11/19/2018 [name] [birth year] [id number]   HPI:  Ryan Pittman is a 78 y.o. male patient of Dr Gwenlyn Found, who presents today for a lipid clinic evaluation.  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  He is here today to discuss options for treating his hyperlipidemia.  He was intolerant to atorvastatin 80 mg, as it caused myalgias.  His physician switched him to pravastatin 40 mg.  He has been able to tolerate this somewhat.  It is causing some pains in his lower legs, but for now he is willing to continue, in light of his recent bypass surgery.    Current Medications:  pravastain 40 mg - some pain in back on calf  Cholesterol Goals: LDL < 70   Intolerant/previously tried:  Atorvastatin 80 mg caused myalgias  Family history:   Does not know parents history - mother died at 70, father at 80  Siblings without significant heart disease  1 son with hyperlipidemia, hypertension,  1 daughter with hypertension  Diet: admits to enjoying eating out - trying to avoid fried foods and country ham.  Believes he has done well since surgery last November; generally does not eat many white foods; enjoys vegetables most days  Exercise:  Cardiac rehab three times weekly  Labs:   10/2018:  TC 215, TG 91, HDL 71, LDL 126  Current Outpatient Medications  Medication Sig Dispense Refill  . amLODipine (NORVASC) 2.5 MG tablet Take 2.5 mg by mouth daily.    . clopidogrel (PLAVIX) 75 MG tablet TAKE ONE TABLET EVERY DAY 90 tablet 3  . Co-Enzyme Q-10 100 MG CAPS Take by mouth.    . levothyroxine (SYNTHROID, LEVOTHROID) 75 MCG tablet Take 75 mcg by mouth daily before breakfast. Takes 1.5 tabs every Thursday per pt    . metoprolol tartrate (LOPRESSOR) 25 MG tablet Take 0.5 tablets (12.5 mg total) by mouth 2 (two) times daily.  30 tablet 10  . Omega-3 Fatty Acids (FISH OIL OMEGA-3 PO) Take 1 capsule by mouth daily.     . pravastatin (PRAVACHOL) 40 MG tablet Take 1 tablet (40 mg total) by mouth daily. 90 tablet 3  . tamsulosin (FLOMAX) 0.4 MG CAPS capsule Take 1 capsule (0.4 mg total) by mouth daily. 30 capsule 0  . traZODone (DESYREL) 100 MG tablet TAKE 1/2 TO 1 TABLET BY MOUTH AT BEDTIMEAS NEEDED FOR SLEEP 30 tablet 12  . vitamin C (ASCORBIC ACID) 500 MG tablet Take 500 mg by mouth daily.     No current facility-administered medications for this visit.     Allergies  Allergen Reactions  . Penicillins Itching and Other (See Comments)    PATIENT HAS HAD A PCN REACTION WITH IMMEDIATE RASH, FACIAL/TONGUE/THROAT SWELLING, SOB, OR LIGHTHEADEDNESS WITH HYPOTENSION:  #  #  YES  #  #  Has patient had a PCN reaction causing severe rash involving mucus membranes or skin necrosis: No Has patient had a PCN reaction that required hospitalization: No Has patient had a PCN reaction occurring within the last 10 years: No If all of the above answers are "NO", then may proceed with Cephalosporin use.   . Ace Inhibitors Cough  . Aspirin Other (See Comments)    H/o GI bleed  . Celebrex [Celecoxib] Other (See Comments)    GI bleed  . Lipitor [Atorvastatin] Other (See Comments)  myalgias    Past Medical History:  Diagnosis Date  . Anemia    due to GIB, s/p transfusion  . Anginal pain ([place])   . Arthritis    back pain, much worse after consecutive golf rounds  . Cancer ([place])    thyroid  . Chronic kidney disease   . Colon polyps   . Coronary artery disease   . Dyspnea    walking up a hill  . GERD (gastroesophageal reflux disease)   . Hypercalcemia    h/o, resolved as of 2012, prev due to high amount of calcium intake  . Hyperlipidemia   . Hypertension   . IgG gammopathy    stable as of 2012 per [facility]  . Pneumonia    as a child  . PVD (peripheral vascular disease) ([facility])    L leg bypass, R leg stented    Blood  pressure (!) 110/42, pulse 80, resp. rate 15, height 5\' 7"  (1.702 m), weight 154 lb 12.8 oz (70.2 kg), SpO2 96 %.   No problem-specific Assessment & Plan notes found for this encounter.   Tommy Medal PharmD CPP [facility]

## 2018-11-20 ENCOUNTER — Other Ambulatory Visit: Payer: Self-pay | Admitting: *Deleted

## 2018-11-20 ENCOUNTER — Encounter: Payer: Self-pay | Admitting: Pharmacist Clinician (PhC)/ Clinical Pharmacy Specialist

## 2018-11-20 MED ORDER — TAMSULOSIN HCL 0.4 MG PO CAPS: 0.4000 mg | ORAL_CAPSULE | Freq: Every day | ORAL | 11 refills | Status: DC

## 2018-11-20 NOTE — Assessment & Plan Note (Signed)
Patient with ASCVD and elevated LDL.  Reviewed options for cholesterol management including Repatha and Nexletol.  Patient willing to try Repatha at this time.  Will start the paperwork to get that approved thru his insurance company.

## 2018-11-27 ENCOUNTER — Encounter: Payer: Self-pay | Admitting: *Deleted

## 2018-11-27 DIAGNOSIS — Z951 Presence of aortocoronary bypass graft: Secondary | ICD-10-CM

## 2018-11-27 NOTE — Progress Notes (Signed)
Cardiac Individual Treatment Plan  Patient Details  Name: Ryan Pittman MRN: [id number] Date of Birth: 1940-09-08 Referring Provider:     Cardiac Rehab from 09/23/2018 in [facility] Cardiac and Pulmonary Rehab  Referring Provider  [name] MD      Initial Encounter Date:    Cardiac Rehab from 09/23/2018 in [facility] Cardiac and Pulmonary Rehab  Date  09/23/18      Visit Diagnosis: S/P CABG x 3  Patient's Home Medications on Admission:  Current Outpatient Medications:  .  amLODipine (NORVASC) 2.5 MG tablet, Take 2.5 mg by mouth daily., Disp: , Rfl:  .  clopidogrel (PLAVIX) 75 MG tablet, TAKE ONE TABLET EVERY DAY, Disp: 90 tablet, Rfl: 3 .  Co-Enzyme Q-10 100 MG CAPS, Take by mouth., Disp: , Rfl:  .  levothyroxine (SYNTHROID, LEVOTHROID) 75 MCG tablet, Take 75 mcg by mouth daily before breakfast. Takes 1.5 tabs every Thursday per pt, Disp: , Rfl:  .  metoprolol tartrate (LOPRESSOR) 25 MG tablet, Take 0.5 tablets (12.5 mg total) by mouth 2 (two) times daily., Disp: 30 tablet, Rfl: 10 .  Omega-3 Fatty Acids (FISH OIL OMEGA-3 PO), Take 1 capsule by mouth daily. , Disp: , Rfl:  .  pravastatin (PRAVACHOL) 40 MG tablet, Take 1 tablet (40 mg total) by mouth daily., Disp: 90 tablet, Rfl: 3 .  tamsulosin (FLOMAX) 0.4 MG CAPS capsule, Take 1 capsule (0.4 mg total) by mouth daily., Disp: 30 capsule, Rfl: 11 .  traZODone (DESYREL) 100 MG tablet, TAKE 1/2 TO 1 TABLET BY MOUTH AT BEDTIMEAS NEEDED FOR SLEEP, Disp: 30 tablet, Rfl: 12 .  vitamin C (ASCORBIC ACID) 500 MG tablet, Take 500 mg by mouth daily., Disp: , Rfl:   Past Medical History: Past Medical History:  Diagnosis Date  . Anemia    due to GIB, s/p transfusion  . Anginal pain ([place])   . Arthritis    back pain, much worse after consecutive golf rounds  . Cancer ([place])    thyroid  . Chronic kidney disease   . Colon polyps   . Coronary artery disease   . Dyspnea    walking up a hill  . GERD (gastroesophageal reflux disease)   .  Hypercalcemia    h/o, resolved as of 2012, prev due to high amount of calcium intake  . Hyperlipidemia   . Hypertension   . IgG gammopathy    stable as of 2012 per [facility]  . Pneumonia    as a child  . PVD (peripheral vascular disease) ([place])    L leg bypass, R leg stented    Tobacco Use: Social History   Tobacco Use  Smoking Status Former Smoker  . Types: Cigarettes  . Last attempt to quit: 09/04/2008  . Years since quitting: 10.2  Smokeless Tobacco Former Systems developer  . Types: Chew  Tobacco Comment   HAs quit tobacco products 2009    Labs: Recent Review Flowsheet Data    Labs for ITP Cardiac and Pulmonary Rehab Latest Ref Rng & Units 07/11/2018 07/11/2018 07/12/2018 07/14/2018 10/23/2018   Cholestrol 100 - 199 mg/dL - - - - 215(H)   LDLCALC 0 - 99 mg/dL - - - - 126(H)   LDLDIRECT mg/dL - - - - -   HDL >39 mg/dL - - - - 71   Trlycerides 0 - 149 mg/dL - - - - 91   Hemoglobin A1c 4.8 - 5.6 % - - - - -   PHART 7.350 - 7.450 7.342(L) - - - -  PCO2ART 32.0 - 48.0 mmHg 31.9(L) - - - -   HCO3 20.0 - 28.0 mmol/L 17.3(L) - - - -   TCO2 22 - 32 mmol/L 18(L) 19(L) 25 - -   ACIDBASEDEF 0.0 - 2.0 mmol/L 8.0(H) - - - -   O2SAT % 97.0 - - 56.3 -       Exercise Target Goals: Exercise Program Goal: Individual exercise prescription set using results from initial 6 min walk test and THRR while considering  patient's activity barriers and safety.   Exercise Prescription Goal: Initial exercise prescription builds to 30-45 minutes a day of aerobic activity, 2-3 days per week.  Home exercise guidelines will be given to patient during program as part of exercise prescription that the participant will acknowledge.  Activity Barriers & Risk Stratification: Activity Barriers & Cardiac Risk Stratification - 09/23/18 1235      Activity Barriers & Cardiac Risk Stratification   Activity Barriers  Arthritis;Back Problems;Joint Problems   Arthritis in hands/finger and back. Hand joints not flexible because  of arthritis   Cardiac Risk Stratification  High       6 Minute Walk: 6 Minute Walk    Row Name 09/23/18 1511         6 Minute Walk   Phase  Initial     Distance  1204 feet     Walk Time  6 minutes     # of Rest Breaks  0     MPH  2.28     METS  1.82     RPE  13     VO2 Peak  6.38     Symptoms  Yes (comment)     Comments  PAD  Claudication Pain Score 4/5     Resting HR  52 bpm     Resting BP  126/72     Resting Oxygen Saturation   96 %     Exercise Oxygen Saturation  during 6 min walk  99 %     Max Ex. HR  84 bpm     Max Ex. BP  154/84     2 Minute Post BP  142/64        Oxygen Initial Assessment:   Oxygen Re-Evaluation:   Oxygen Discharge (Final Oxygen Re-Evaluation):   Initial Exercise Prescription: Initial Exercise Prescription - 09/23/18 1500      Date of Initial Exercise RX and Referring Provider   Date  09/23/18    Referring Provider  [name] MD      Treadmill   MPH  2    Grade  0.5    Minutes  15    METs  2.67      Bike   Level  0.3    Minutes  15    METs  1.8      T5 Nustep   Level  1    SPM  80    Minutes  15    METs  2.5      Prescription Details   Frequency (times per week)  2    Duration  Progress to 30 minutes of continuous aerobic without signs/symptoms of physical distress      Intensity   THRR 40-80% of Max Heartrate  88-123    Ratings of Perceived Exertion  11-13    Perceived Dyspnea  0-4      Progression   Progression  Continue to progress workloads to maintain intensity without signs/symptoms of physical distress.  Resistance Training   Training Prescription  Yes    Weight  3 lbs    Reps  10-15       Perform Capillary Blood Glucose checks as needed.  Exercise Prescription Changes: Exercise Prescription Changes    Row Name 09/23/18 1200 10/08/18 1300 10/23/18 0900 10/24/18 1000 11/06/18 1500     Response to Exercise   Blood Pressure (Admit)  126/72  106/60  126/60  -  154/60   Blood Pressure  (Exercise)  154/84  150/78  132/62  -  156/60   Blood Pressure (Exit)  142/64  128/60  142/62  -  132/62   Heart Rate (Admit)  52 bpm  60 bpm  74 bpm  -  71 bpm   Heart Rate (Exercise)  84 bpm  116 bpm  109 bpm  -  89 bpm   Heart Rate (Exit)  54 bpm  56 bpm  66 bpm  -  61 bpm   Oxygen Saturation (Admit)  96 %  -  -  -  -   Oxygen Saturation (Exercise)  99 %  -  -  -  -   Rating of Perceived Exertion (Exercise)  '13  13  12  ' -  15   Symptoms  CPS 4/5  none  none  -  none   Comments  walk test results  -  -  -  -   Duration  -  Continue with 30 min of aerobic exercise without signs/symptoms of physical distress.  Continue with 30 min of aerobic exercise without signs/symptoms of physical distress.  -  Continue with 30 min of aerobic exercise without signs/symptoms of physical distress.   Intensity  -  THRR unchanged  THRR unchanged  -  THRR unchanged     Progression   Progression  -  Continue to progress workloads to maintain intensity without signs/symptoms of physical distress.  Continue to progress workloads to maintain intensity without signs/symptoms of physical distress.  -  Continue to progress workloads to maintain intensity without signs/symptoms of physical distress.   Average METs  -  1.78  3.12  -  2.56     Resistance Training   Training Prescription  -  Yes  Yes  -  Yes   Weight  -  3 lbs  4 lbs  -  5 lbs   Reps  -  10-15  10-15  -  10-15     Interval Training   Interval Training  -  No  No  -  No     Treadmill   MPH  -  1  2  -  2   Grade  -  0  0.5  -  0.5   Minutes  -  15  15  -  15   METs  -  1.77  2.67  -  2.67     Bike   Level  -  -  1  -  1   Watts  -  -  -  -  25   Minutes  -  -  35  -  15   METs  -  -  3.57  -  3.12     T5 Nustep   Level  -  1  -  -  1   Minutes  -  15  -  -  15   METs  -  1.8  -  -  1.9  Home Exercise Plan   Plans to continue exercise at  -  -  -  Home (comment) walking  Home (comment) walking   Frequency  -  -  -  Add 3 additional  days to program exercise sessions.  Add 3 additional days to program exercise sessions.   Initial Home Exercises Provided  -  -  -  10/24/18  10/24/18   Row Name 11/20/18 0900             Response to Exercise   Blood Pressure (Admit)  126/64       Blood Pressure (Exercise)  136/70       Blood Pressure (Exit)  104/60       Heart Rate (Admit)  58 bpm       Heart Rate (Exercise)  78 bpm       Heart Rate (Exit)  53 bpm       Rating of Perceived Exertion (Exercise)  14       Symptoms  none       Duration  Continue with 30 min of aerobic exercise without signs/symptoms of physical distress.       Intensity  THRR unchanged         Progression   Progression  Continue to progress workloads to maintain intensity without signs/symptoms of physical distress.       Average METs  2.64         Resistance Training   Training Prescription  Yes       Weight  5 lbs       Reps  10-15         Interval Training   Interval Training  No         Treadmill   MPH  2       Grade  0.5       Minutes  15       METs  2.67         Bike   Level  1       Watts  32       Minutes  15       METs  3.64         T5 Nustep   Level  1       Minutes  15       METs  1.9         Home Exercise Plan   Plans to continue exercise at  Home (comment) walking       Frequency  Add 3 additional days to program exercise sessions.       Initial Home Exercises Provided  10/24/18          Exercise Comments: Exercise Comments    Row Name 10/01/18 0932           Exercise Comments  :First full day of exercise!  Patient was oriented to gym and equipment including functions, settings, policies, and procedures.  Patient's individual exercise prescription and treatment plan were reviewed.  All starting workloads were established based on the results of the 6 minute walk test done at initial orientation visit.  The plan for exercise progression was also introduced and progression will be customized based on patient's  performance and goals.          Exercise Goals and Review: Exercise Goals    Row Name 09/23/18 1517             Exercise Goals   Increase  Physical Activity  Yes       Intervention  Provide advice, education, support and counseling about physical activity/exercise needs.;Develop an individualized exercise prescription for aerobic and resistive training based on initial evaluation findings, risk stratification, comorbidities and participant's personal goals.       Expected Outcomes  Short Term: Attend rehab on a regular basis to increase amount of physical activity.;Long Term: Add in home exercise to make exercise part of routine and to increase amount of physical activity.;Long Term: Exercising regularly at least 3-5 days a week.       Increase Strength and Stamina  Yes       Intervention  Provide advice, education, support and counseling about physical activity/exercise needs.;Develop an individualized exercise prescription for aerobic and resistive training based on initial evaluation findings, risk stratification, comorbidities and participant's personal goals.       Expected Outcomes  Short Term: Increase workloads from initial exercise prescription for resistance, speed, and METs.;Short Term: Perform resistance training exercises routinely during rehab and add in resistance training at home;Long Term: Improve cardiorespiratory fitness, muscular endurance and strength as measured by increased METs and functional capacity (6MWT)       Able to understand and use rate of perceived exertion (RPE) scale  Yes       Intervention  Provide education and explanation on how to use RPE scale       Expected Outcomes  Short Term: Able to use RPE daily in rehab to express subjective intensity level;Long Term:  Able to use RPE to guide intensity level when exercising independently       Knowledge and understanding of Target Heart Rate Range (THRR)  Yes       Intervention  Provide education and explanation  of THRR including how the numbers were predicted and where they are located for reference       Expected Outcomes  Short Term: Able to state/look up THRR;Short Term: Able to use daily as guideline for intensity in rehab;Long Term: Able to use THRR to govern intensity when exercising independently       Able to check pulse independently  Yes       Intervention  Provide education and demonstration on how to check pulse in carotid and radial arteries.;Review the importance of being able to check your own pulse for safety during independent exercise       Expected Outcomes  Short Term: Able to explain why pulse checking is important during independent exercise;Long Term: Able to check pulse independently and accurately       Understanding of Exercise Prescription  Yes       Intervention  Provide education, explanation, and written materials on patient's individual exercise prescription       Expected Outcomes  Short Term: Able to explain program exercise prescription;Long Term: Able to explain home exercise prescription to exercise independently          Exercise Goals Re-Evaluation : Exercise Goals Re-Evaluation    Row Name 10/01/18 0932 10/08/18 1337 10/23/18 0919 10/24/18 1059 11/06/18 1519     Exercise Goal Re-Evaluation   Exercise Goals Review  Increase Physical Activity;Increase Strength and Stamina;Able to understand and use rate of perceived exertion (RPE) scale;Knowledge and understanding of Target Heart Rate Range (THRR);Understanding of Exercise Prescription  Increase Physical Activity;Increase Strength and Stamina;Understanding of Exercise Prescription  Increase Physical Activity;Increase Strength and Stamina;Understanding of Exercise Prescription  Increase Physical Activity;Increase Strength and Stamina;Understanding of Exercise Prescription;Able to understand and use rate of perceived  exertion (RPE) scale;Knowledge and understanding of Target Heart Rate Range (THRR);Able to check pulse  independently  Increase Physical Activity;Increase Strength and Stamina;Understanding of Exercise Prescription   Comments  Reviewed RPE scale, THR and program prescription with pt today.  Pt voiced understanding and was given a copy of goals to take home.   Ryan Pittman has completed his first two full days of exericse. He has done well so far.  We will continue to montior his progress.   Ryan Pittman has been doing well in rehab.  He is up to 2.0 mph on treadmill.  We will continue to monitor his progress.  Reviewed home exercise with pt today.  Pt plans to walk and use weights at home for exercise.  Reviewed THR, pulse, RPE, sign and symptoms, NTG use, and when to call 911 or MD.  Also discussed weather considerations and indoor options.  Pt voiced understanding.  Ryan Pittman continues to do well in rehab.  He is now using 5 lbs handweights.  We will continue to encourage him to increase his workloads and monitor his progress.    Expected Outcomes  Short: Use RPE daily to regulate intensity. Long: Follow program prescription in THR.  Short: Continue to attend class regularly.  Long: Continue to follow program prescription.   Short: Increase workloads on seated equipment.  Long:  Continue to build strength and stamina  Short: Start to add in exercise at home.  Long: Continue to build strength and stamina.   Short: Increase workloads.  Long: Continue to build stamina.    Ryan Pittman Name 11/12/18 0950 11/20/18 0948           Exercise Goal Re-Evaluation   Exercise Goals Review  Increase Physical Activity;Increase Strength and Stamina;Understanding of Exercise Prescription  Increase Physical Activity;Increase Strength and Stamina;Understanding of Exercise Prescription      Comments  Ryan Pittman has been doing well in rehab. He is getting in his home exercise for 30 min at home about 4-5 times a week.  He is starting to get back his strength and stamina.  Ryan Pittman has been doing well in rehab.  He is getting 32 watts on the bike.  He  will be walking and using videos at home.  We will continue to monitor his progress.       Expected Outcomes  Short: Continue to increase workloads.  Long: Continue to build stamina.   Short: Continue to walk at home.  Long: Continue to build strength and stamina.          Discharge Exercise Prescription (Final Exercise Prescription Changes): Exercise Prescription Changes - 11/20/18 0900      Response to Exercise   Blood Pressure (Admit)  126/64    Blood Pressure (Exercise)  136/70    Blood Pressure (Exit)  104/60    Heart Rate (Admit)  58 bpm    Heart Rate (Exercise)  78 bpm    Heart Rate (Exit)  53 bpm    Rating of Perceived Exertion (Exercise)  14    Symptoms  none    Duration  Continue with 30 min of aerobic exercise without signs/symptoms of physical distress.    Intensity  THRR unchanged      Progression   Progression  Continue to progress workloads to maintain intensity without signs/symptoms of physical distress.    Average METs  2.64      Resistance Training   Training Prescription  Yes    Weight  5 lbs    Reps  10-15  Interval Training   Interval Training  No      Treadmill   MPH  2    Grade  0.5    Minutes  15    METs  2.67      Bike   Level  1    Watts  32    Minutes  15    METs  3.64      T5 Nustep   Level  1    Minutes  15    METs  1.9      Home Exercise Plan   Plans to continue exercise at  Home (comment)   walking   Frequency  Add 3 additional days to program exercise sessions.    Initial Home Exercises Provided  10/24/18       Nutrition:  Target Goals: Understanding of nutrition guidelines, daily intake of sodium <1581m, cholesterol <2060m calories 30% from fat and 7% or less from saturated fats, daily to have 5 or more servings of fruits and vegetables.  Biometrics: Pre Biometrics - 09/23/18 1517      Pre Biometrics   Height  5' 6.1" (1.679 m)    Weight  154 lb 6.4 oz (70 kg)    Waist Circumference  37.5 inches    Hip  Circumference  36.5 inches    Waist to Hip Ratio  1.03 %    BMI (Calculated)  24.84    Single Leg Stand  8.01 seconds        Nutrition Therapy Plan and Nutrition Goals: Nutrition Therapy & Goals - 11/20/18 1209      Nutrition Therapy   Diet  Heart Healthy Low Sodium 1400-1600kcal (MSJ 70kg, 170cm, 77yo) BMI:24, waist: 37.5 inches (<40)    Protein (specify units)  55-60 grams    Fiber  30 grams   30-38g   Whole Grain Foods  3 servings   pt eats packet of oatmeal each morning and tries to eat whole grain bread   Saturated Fats  12 max. grams   <12-16g (<7% of kcal)   Fruits and Vegetables  5 servings/day   patient tries to eat things like lima beans, black eyed peas, turnip greens, bananas, blueberries   Sodium  1.5 grams   150069m   Personal Nutrition Goals   Nutrition Goal  ST: Continue to make healthy changes such as incorporating F/V, whole grains, lean meats, etc. LT: lower cholesterol (patient reports Dr. told him his cholesterol was getting high and he needs to work on it (11/19/18) pt reports that he is now on more medication to lower cholesterol)    Comments  Patient reports eating more grilled chicken and salmon and less pork and country ham (which he loves). Pt reports that he does not want to lose weight (his weight is WNL). Pt reports coninuing with exercise at home. Discussed MyPlate with patient and his kcal and protein goals; pt seems to be doing well with goals set by team.       Intervention Plan   Intervention  Prescribe, educate and counsel regarding individualized specific dietary modifications aiming towards targeted core components such as weight, hypertension, lipid management, diabetes, heart failure and other comorbidities.    Expected Outcomes  Short Term Goal: A plan has been developed with personal nutrition goals set during dietitian appointment.;Long Term Goal: Adherence to prescribed nutrition plan.       Nutrition Assessments: Nutrition Assessments -  09/23/18 1240      MEDFICTS Scores  Pre Score  12       Nutrition Goals Re-Evaluation: Nutrition Goals Re-Evaluation    Row Name 10/10/18 1019 11/12/18 0953           Goals   Current Weight  151 lb (68.5 kg)  -      Nutrition Goal  Eat healthier  Heart Health Diet      Comment  Patient has been eating less red meats and fried foods. He drinks plenty of water throughout the day. Ryan Pittman will try not to overeat when he skips a meal and do better on portion control.  Ryan Pittman has been eating better.  He has avoided his country ham but misses it.  We talked about having it on occasion.  He continues to work on portion control.  He continues to get plenty of water and tries for more fruits and vegetables.       Expected Outcome  Short: decrease portion size. Long: lose a few pounds and maintain weight.  Short: Continue to work on portion size.  Long: Continue to work on weight loss.          Nutrition Goals Discharge (Final Nutrition Goals Re-Evaluation): Nutrition Goals Re-Evaluation - 11/12/18 0953      Goals   Nutrition Goal  Heart Health Diet    Comment  Ryan Pittman has been eating better.  He has avoided his country ham but misses it.  We talked about having it on occasion.  He continues to work on portion control.  He continues to get plenty of water and tries for more fruits and vegetables.     Expected Outcome  Short: Continue to work on portion size.  Long: Continue to work on weight loss.        Psychosocial: Target Goals: Acknowledge presence or absence of significant depression and/or stress, maximize coping skills, provide positive support system. Participant is able to verbalize types and ability to use techniques and skills needed for reducing stress and depression.   Initial Review & Psychosocial Screening: Initial Psych Review & Screening - 09/23/18 1238      Initial Review   Current issues with  None Identified   Does have restless leg syndrome. Some anxiety with it.      Family Dynamics   Good Support System?  Yes   spouse     Barriers   Psychosocial barriers to participate in program  There are no identifiable barriers or psychosocial needs.;The patient should benefit from training in stress management and relaxation.      Screening Interventions   Interventions  Encouraged to exercise;To provide support and resources with identified psychosocial needs;Provide feedback about the scores to participant    Expected Outcomes  Short Term goal: Utilizing psychosocial counselor, staff and physician to assist with identification of specific Stressors or current issues interfering with healing process. Setting desired goal for each stressor or current issue identified.;Long Term Goal: Stressors or current issues are controlled or eliminated.;Short Term goal: Identification and review with participant of any Quality of Life or Depression concerns found by scoring the questionnaire.;Long Term goal: The participant improves quality of Life and PHQ9 Scores as seen by post scores and/or verbalization of changes       Quality of Life Scores:  Quality of Life - 09/23/18 1239      Quality of Life   Select  Quality of Life      Quality of Life Scores   Health/Function Pre  28.2 %    Socioeconomic  Pre  30 %    Psych/Spiritual Pre  30 %    Family Pre  27.6 %    GLOBAL Pre  28.85 %      Scores of 19 and below usually indicate a poorer quality of life in these areas.  A difference of  2-3 points is a clinically meaningful difference.  A difference of 2-3 points in the total score of the Quality of Life Index has been associated with significant improvement in overall quality of life, self-image, physical symptoms, and general health in studies assessing change in quality of life.  PHQ-9: Recent Review Flowsheet Data    Depression screen [facility] 2/9 09/23/2018 03/27/2018 03/22/2017 12/24/2015   Decreased Interest 0 0 0 0   Down, Depressed, Hopeless 0 0 0 0   PHQ - 2 Score  0 0 0 0   Altered sleeping 0 0 - -   Tired, decreased energy 1 0 - -   Change in appetite 0 0 - -   Feeling bad or failure about yourself  0 0 - -   Trouble concentrating 0 0 - -   Moving slowly or fidgety/restless 0 0 - -   Suicidal thoughts 0 0 - -   PHQ-9 Score 1 0 - -   Difficult doing work/chores Somewhat difficult Not difficult at all - -     Interpretation of Total Score  Total Score Depression Severity:  1-4 = Minimal depression, 5-9 = Mild depression, 10-14 = Moderate depression, 15-19 = Moderately severe depression, 20-27 = Severe depression   Psychosocial Evaluation and Intervention: Psychosocial Evaluation - 10/17/18 1457      Psychosocial Evaluation & Interventions   Interventions  Stress management education;Encouraged to exercise with the program and follow exercise prescription    Comments  Counselor met with [name]) today for initial psychosocial evaluation.  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  He has a strong suport system with a spouse, son and daughter locally and active involvement in his local church.  Ryan Pittman reports trouble getting  to sleep but typically sleeps 7-8 hours.  He has a good appetite.  Ryan Pittman denies a history of depression or anxiety or any current symptoms and is typically in a positive mood.  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  He has goals to just "get through this program" and plans to continue exercising at home.  Staff will follow with him.     Expected Outcomes  Short:  Bernardino will exercise for his health and as a positive coping strategy for stress.  Long:  Bao will develop positive  lifestyle habits for his health and mental health.     Continue Psychosocial Services   Follow up required by staff       Psychosocial Re-Evaluation: Psychosocial Re-Evaluation    [place] Name 10/10/18 1015 11/12/18 0951           Psychosocial Re-Evaluation    Current issues with  Current Stress Concerns  Current Stress Concerns      Comments  Bralynn has a business at [employer]. Taxes are coming around and is stressful. He had a bypass and feels positive about his outcome.   Haadi is doing well in rehab.  He has gotten his taxes done and feeling a little better.  He uses golf and exercise as stress relief.  He is sleeping  good.  He has enjoyed getting away from work regularly for exercise.       Expected Outcomes  Short: Attend [facility] regularly. Long: maintain a positive outlook on his health.  Short: Continue to get in exercise for stress relief.  Long: Continue with postive outlook.       Interventions  Encouraged to attend Cardiac Rehabilitation for the exercise;Stress management education  Encouraged to attend Cardiac Rehabilitation for the exercise;Stress management education      Continue Psychosocial Services   Follow up required by counselor  Follow up required by staff         Psychosocial Discharge (Final Psychosocial Re-Evaluation): Psychosocial Re-Evaluation - 11/12/18 0951      Psychosocial Re-Evaluation   Current issues with  Current Stress Concerns    Comments  Ryan Pittman is doing well in rehab.  He has gotten his taxes done and feeling a little better.  He uses golf and exercise as stress relief.  He is sleeping good.  He has enjoyed getting away from work regularly for exercise.     Expected Outcomes  Short: Continue to get in exercise for stress relief.  Long: Continue with postive outlook.     Interventions  Encouraged to attend Cardiac Rehabilitation for the exercise;Stress management education    Continue Psychosocial Services   Follow up required by staff       Vocational Rehabilitation: Provide vocational rehab assistance to qualifying candidates.   Vocational Rehab Evaluation & Intervention: Vocational Rehab - 09/23/18 1241      Initial Vocational Rehab Evaluation & Intervention   Assessment shows need for  Vocational Rehabilitation  No       Education: Education Goals: Education classes will be provided on a variety of topics geared toward better understanding of heart health and risk factor modification. Participant will state understanding/return demonstration of topics presented as noted by education test scores.  Learning Barriers/Preferences: Learning Barriers/Preferences - 09/23/18 1240      Learning Barriers/Preferences   Learning Barriers  None    Learning Preferences  Individual Instruction       Education Topics:  AED/CPR: - Group verbal and written instruction with the use of models to demonstrate the basic use of the AED with the basic ABC's of resuscitation.   Cardiac Rehab from 11/14/2018 in [facility] Cardiac and Pulmonary Rehab  Date  10/29/18  Educator  SB  Instruction Review Code  1- Verbalizes Understanding      General Nutrition Guidelines/Fats and Fiber: -Group instruction provided by verbal, written material, models and posters to present the general guidelines for heart healthy nutrition. Gives an explanation and review of dietary fats and fiber.   Cardiac Rehab from 11/14/2018 in [facility] Cardiac and Pulmonary Rehab  Date  11/12/18  Educator  [facility]  Instruction Review Code  1- Verbalizes Understanding      Controlling Sodium/Reading Food Labels: -Group verbal and written material supporting the discussion of sodium use in heart healthy nutrition. Review and explanation with models, verbal and written materials for utilization of the food label.   Cardiac Rehab from 11/14/2018 in [facility] Cardiac and Pulmonary Rehab  Date  11/14/18  Educator  [facility]  Instruction Review Code  1- Verbalizes Understanding      Exercise Physiology & General Exercise Guidelines: - Group verbal and written instruction with models to review the exercise physiology of the cardiovascular system and associated critical values. Provides general exercise guidelines with specific guidelines to those  with heart or lung disease.  Aerobic Exercise & Resistance Training: - Gives group verbal and written instruction on the various components of exercise. Focuses on aerobic and resistive training programs and the benefits of this training and how to safely progress through these programs..   Flexibility, Balance, Mind/Body Relaxation: Provides group verbal/written instruction on the benefits of flexibility and balance training, including mind/body exercise modes such as yoga, pilates and tai chi.  Demonstration and skill practice provided.   Cardiac Rehab from 11/14/2018 in [facility] Cardiac and Pulmonary Rehab  Date  10/01/18  Educator  AS  Instruction Review Code  1- Verbalizes Understanding      Stress and Anxiety: - Provides group verbal and written instruction about the health risks of elevated stress and causes of high stress.  Discuss the correlation between heart/lung disease and anxiety and treatment options. Review healthy ways to manage with stress and anxiety.   Cardiac Rehab from 11/14/2018 in [facility] Cardiac and Pulmonary Rehab  Date  10/22/18  Educator  [facility]  Instruction Review Code  1- Verbalizes Understanding      Depression: - Provides group verbal and written instruction on the correlation between heart/lung disease and depressed mood, treatment options, and the stigmas associated with seeking treatment.   Anatomy & Physiology of the Heart: - Group verbal and written instruction and models provide basic cardiac anatomy and physiology, with the coronary electrical and arterial systems. Review of Valvular disease and Heart Failure   Cardiac Rehab from 11/14/2018 in [facility] Cardiac and Pulmonary Rehab  Date  10/10/18  Educator  CE  Instruction Review Code  1- Verbalizes Understanding      Cardiac Procedures: - Group verbal and written instruction to review commonly prescribed medications for heart disease. Reviews the medication, class of the drug, and side effects. Includes  the steps to properly store meds and maintain the prescription regimen. (beta blockers and nitrates)   Cardiac Rehab from 11/14/2018 in [facility] Cardiac and Pulmonary Rehab  Date  10/24/18  Educator  CE  Instruction Review Code  1- Verbalizes Understanding      Cardiac Medications I: - Group verbal and written instruction to review commonly prescribed medications for heart disease. Reviews the medication, class of the drug, and side effects. Includes the steps to properly store meds and maintain the prescription regimen.   Cardiac Rehab from 11/14/2018 in [facility] Cardiac and Pulmonary Rehab  Date  10/15/18  Educator  SB  Instruction Review Code  1- Verbalizes Understanding      Cardiac Medications II: -Group verbal and written instruction to review commonly prescribed medications for heart disease. Reviews the medication, class of the drug, and side effects. (all other drug classes)   Cardiac Rehab from 11/14/2018 in [facility] Cardiac and Pulmonary Rehab  Date  10/03/18  Educator  CE  Instruction Review Code  1- Verbalizes Understanding       Go Sex-Intimacy & Heart Disease, Get SMART - Goal Setting: - Group verbal and written instruction through game format to discuss heart disease and the return to sexual intimacy. Provides group verbal and written material to discuss and apply goal setting through the application of the S.M.A.R.T. Method.   Cardiac Rehab from 11/14/2018 in [facility] Cardiac and Pulmonary Rehab  Date  10/24/18  Educator  CE  Instruction Review Code  1- Verbalizes Understanding      Other Matters of the Heart: - Provides group verbal, written materials and models to describe Stable Angina and Peripheral Artery. Includes description of the disease process and treatment options  available to the cardiac patient.   Cardiac Rehab from 11/14/2018 in [facility] Cardiac and Pulmonary Rehab  Date  10/10/18  Educator  CE  Instruction Review Code  1- Verbalizes Understanding      Exercise &  Equipment Safety: - Individual verbal instruction and demonstration of equipment use and safety with use of the equipment.   Cardiac Rehab from 11/14/2018 in [facility] Cardiac and Pulmonary Rehab  Date  09/23/18  Educator  [facility]  Instruction Review Code  1- Verbalizes Understanding      Infection Prevention: - Provides verbal and written material to individual with discussion of infection control including proper hand washing and proper equipment cleaning during exercise session.   Cardiac Rehab from 11/14/2018 in [facility] Cardiac and Pulmonary Rehab  Date  09/23/18  Educator  [facility]  Instruction Review Code  1- Verbalizes Understanding      Falls Prevention: - Provides verbal and written material to individual with discussion of falls prevention and safety.   Cardiac Rehab from 11/14/2018 in [facility] Cardiac and Pulmonary Rehab  Date  09/23/18  Educator  [facility]  Instruction Review Code  1- Verbalizes Understanding      Diabetes: - Individual verbal and written instruction to review signs/symptoms of diabetes, desired ranges of glucose level fasting, after meals and with exercise. Acknowledge that pre and post exercise glucose checks will be done for 3 sessions at entry of program.   Know Your Numbers and Risk Factors: -Group verbal and written instruction about important numbers in your health.  Discussion of what are risk factors and how they play a role in the disease process.  Review of Cholesterol, Blood Pressure, Diabetes, and BMI and the role they play in your overall health.   Cardiac Rehab from 11/14/2018 in [facility] Cardiac and Pulmonary Rehab  Date  10/03/18  Educator  CE  Instruction Review Code  1- Verbalizes Understanding      Sleep Hygiene: -Provides group verbal and written instruction about how sleep can affect your health.  Define sleep hygiene, discuss sleep cycles and impact of sleep habits. Review good sleep hygiene tips.    Other: -Provides group and verbal instruction on various  topics (see comments)   Knowledge Questionnaire Score: Knowledge Questionnaire Score - 09/23/18 1241      Knowledge Questionnaire Score   Pre Score  20/26   Reviewed correct responses with Dennis today. He verbalized understanding of the responses and had no further questions today.       Core Components/Risk Factors/Patient Goals at Admission: Personal Goals and Risk Factors at Admission - 09/23/18 1241      Core Components/Risk Factors/Patient Goals on Admission   Hypertension  Yes    Intervention  Provide education on lifestyle modifcations including regular physical activity/exercise, weight management, moderate sodium restriction and increased consumption of fresh fruit, vegetables, and low fat dairy, alcohol moderation, and smoking cessation.;Monitor prescription use compliance.    Expected Outcomes  Short Term: Continued assessment and intervention until BP is < 140/53m HG in hypertensive participants. < 130/843mHG in hypertensive participants with diabetes, heart failure or chronic kidney disease.;Long Term: Maintenance of blood pressure at goal levels.    Lipids  Yes    Intervention  Provide education and support for participant on nutrition & aerobic/resistive exercise along with prescribed medications to achieve LDL <7058mHDL >61m28m  Expected Outcomes  Short Term: Participant states understanding of desired cholesterol values and is compliant with medications prescribed. Participant is following exercise prescription and nutrition  guidelines.;Long Term: Cholesterol controlled with medications as prescribed, with individualized exercise RX and with personalized nutrition plan. Value goals: LDL < 21m, HDL > 40 mg.       Core Components/Risk Factors/Patient Goals Review:  Goals and Risk Factor Review    Row Name 10/10/18 1024 11/12/18 0954           Core Components/Risk Factors/Patient Goals Review   Personal Goals Review  Lipids;Hypertension   Lipids;Hypertension;Weight Management/Obesity      Review  Patient states that he has not had his cholesterol checked since his surgery and is getting it checked next week. He will report the values back when he finds out. He is eating better and hopes that his values are good. His blood pressure ranges from the 130's to higher 50's.   RAdantehas been doing well in rehab.  His weight has been staying steady and down a little. He is doing well with his blood pressures and plans to see doctor soon and talk to him about his pressures.   He is doing well on his medications.        Expected Outcomes  Short: get lipids checked. Long: maintain lipids within normal range.  Short: Talk to doctor about medication and pressures. Long: Continue to monitor risk factors.          Core Components/Risk Factors/Patient Goals at Discharge (Final Review):  Goals and Risk Factor Review - 11/12/18 0954      Core Components/Risk Factors/Patient Goals Review   Personal Goals Review  Lipids;Hypertension;Weight Management/Obesity    Review  Ryan Pittman been doing well in rehab.  His weight has been staying steady and down a little. He is doing well with his blood pressures and plans to see doctor soon and talk to him about his pressures.   He is doing well on his medications.      Expected Outcomes  Short: Talk to doctor about medication and pressures. Long: Continue to monitor risk factors.        ITP Comments: ITP Comments    Row Name 09/23/18 1224 10/02/18 0940 10/30/18 0601 11/20/18 0948 11/27/18 1210   ITP Comments  Medical review completed today. ITP sent to Dr MLoleta Chancefor review,changes as needed and signature. [facility]  30 Day Review. Continue with ITP unless directed changes per Medical Director review.  30 Day Review. Continue with ITP unless directed changes per Medical Director review.  Our program is currently closed due to COVID-19.  We are communicating  with patient via phone calls and emails.    30 day review. Continue with ITP unless directed changes by Medical Director chart review.      Comments:

## 2018-12-05 ENCOUNTER — Encounter: Payer: Self-pay | Admitting: *Deleted

## 2018-12-05 DIAGNOSIS — Z951 Presence of aortocoronary bypass graft: Secondary | ICD-10-CM

## 2018-12-23 ENCOUNTER — Other Ambulatory Visit: Payer: Self-pay | Admitting: Cardiology

## 2018-12-25 ENCOUNTER — Telehealth: Payer: Self-pay | Admitting: *Deleted

## 2018-12-25 ENCOUNTER — Encounter: Payer: Self-pay | Admitting: *Deleted

## 2018-12-25 DIAGNOSIS — Z951 Presence of aortocoronary bypass graft: Secondary | ICD-10-CM

## 2018-12-25 NOTE — Telephone Encounter (Signed)
Called to check up pt at home.  He is doing well and exercising regularly!!

## 2019-01-16 ENCOUNTER — Encounter: Payer: Self-pay | Admitting: *Deleted

## 2019-01-16 DIAGNOSIS — Z951 Presence of aortocoronary bypass graft: Secondary | ICD-10-CM

## 2019-02-03 ENCOUNTER — Telehealth: Payer: Self-pay | Admitting: Cardiovascular Disease

## 2019-02-03 NOTE — Telephone Encounter (Signed)
Mychart, no smartphone, consent (verbal), pre reg complete 02/03/19 AF

## 2019-02-04 ENCOUNTER — Telehealth: Payer: Medicare Other | Admitting: Cardiovascular Disease

## 2019-02-04 ENCOUNTER — Telehealth: Payer: Self-pay | Admitting: *Deleted

## 2019-02-04 NOTE — Progress Notes (Unsigned)
{Choose 1 Note Type (Telehealth Visit or Telephone Visit):[phone]}   Date:  02/04/2019   ID:  [name], DOB Sep 18, 1940, MRN [id number]  {Patient Location:[phone]::"Home"} {Provider Location:[id number]::"Home"}  PCP:  Tonia Ghent, MD  Cardiologist:  No primary care provider on file. *** Electrophysiologist:  None   Evaluation Performed:  {Choose Visit Type:[phone]::"Follow-Up Visit"}  Chief Complaint:  ***  History of Present Illness:    Ryan Pittman is a 78 y.o.  mildly overweight married Caucasian male father of 2, grandfather for grandchildren who is accompanied by his wife Ryan Pittman today. He is referred by Dr. Elsie Stain for cardiovascular evaluation because of chest pain.  I last saw him in the office 10/08/2018.His risk factors include 50 pack years of tobacco abuse having quit 8 years ago as well as treated hypertension and hyperlipidemia. There is no family history. He is never had a heart attack or stroke. He does complain of some chest pain that began several months ago occurring on a monthly basis lasting minutes at a time. He does have a history of PAD status post left femoropopliteal bypass grafting several years ago by Dr. Sherron Flemings.  He had a positive stress test and subsequent coronary CTA on 05/24/2018 revealing a high calcium score of 2858 with three-vessel calcification.  He ultimately underwent cardiac catheterization by myself 06/24/2018 revealing three-vessel disease with preserved LV function.  He ultimately underwent CABG x5 by Dr. Cyndia Bent 07/11/2018 and has done well since.  He is participating cardiac rehab.  He denies chest pain, shortness of breath or claudication.  The patient {does/does not:200015} have symptoms concerning for COVID-19 infection (fever, chills, cough, or new shortness of breath).    Past Medical History:  Diagnosis Date  . Anemia    due to GIB, s/p transfusion  . Anginal pain ([place])   . Arthritis    back  pain, much worse after consecutive golf rounds  . Cancer ([place])    thyroid  . Chronic kidney disease   . Colon polyps   . Coronary artery disease   . Dyspnea    walking up a hill  . GERD (gastroesophageal reflux disease)   . Hypercalcemia    h/o, resolved as of 2012, prev due to high amount of calcium intake  . Hyperlipidemia   . Hypertension   . IgG gammopathy    stable as of 2012 per [facility]  . Pneumonia    as a child  . PVD (peripheral vascular disease) ([place])    L leg bypass, R leg stented   Past Surgical History:  Procedure Laterality Date  .  dental implant left bottom-did have bleeding     . BYPASS GRAFT     L leg  . COLONOSCOPY WITH PROPOFOL N/A 05/04/2015   Procedure: COLONOSCOPY WITH PROPOFOL;  Surgeon: Lollie Sails, MD;  Location: [facility] ENDOSCOPY;  Service: Endoscopy;  Laterality: N/A;  . CORONARY ARTERY BYPASS GRAFT N/A 07/11/2018   Procedure: CORONARY ARTERY BYPASS GRAFTING (CABG) x three, using left internal mammary artery and right leg greater saphenous vein harvested endoscopically;  Surgeon: Gaye Pollack, MD;  Location: [place] OR;  Service: Open Heart Surgery;  Laterality: N/A;  . LEFT HEART CATH AND CORONARY ANGIOGRAPHY N/A 06/24/2018   Procedure: LEFT HEART CATH AND CORONARY ANGIOGRAPHY;  Surgeon: [name], MD;  Location: [place] [facility] CV LAB;  Service: Cardiovascular;  Laterality: N/A;  . TEE WITHOUT CARDIOVERSION N/A 07/11/2018   Procedure: TRANSESOPHAGEAL ECHOCARDIOGRAM (TEE);  Surgeon: Gaye Pollack,  MD;  Location: [facility] OR;  Service: Open Heart Surgery;  Laterality: N/A;  . THYROIDECTOMY, PARTIAL  2016     No outpatient medications have been marked as taking for the 02/04/19 encounter (Appointment) with [name], MD.     Allergies:   Penicillins; Ace inhibitors; Aspirin; Celebrex [celecoxib]; and Lipitor [atorvastatin]   Social History   Tobacco Use  . Smoking status: Former Smoker    Types: Cigarettes    Last attempt to quit: 09/04/2008     Years since quitting: 10.4  . Smokeless tobacco: Former Systems developer    Types: Chew  . Tobacco comment: HAs quit tobacco products 2009  Substance Use Topics  . Alcohol use: Yes    Alcohol/week: 0.0 standard drinks    Comment: Occasionally  . Drug use: No     Family Hx: The patient's family history includes Diabetes in his mother; Stroke in his father. There is no history of Colon cancer or Prostate cancer.  ROS:   Please see the history of present illness.    *** All other systems reviewed and are negative.   Prior CV studies:   The following studies were reviewed today:  ***  Labs/Other Tests and Data Reviewed:    EKG:  {EKG/Telemetry Strips [phone]}  Recent Labs: 06/12/2018: TSH 2.430 07/12/2018: Magnesium 2.1 08/06/2018: BUN 25; Creatinine, Ser 1.53; Hemoglobin 9.9; Platelets 265; Potassium 5.2; Sodium 140 10/23/2018: ALT 15   Recent Lipid Panel Lab Results  Component Value Date/Time   CHOL 215 (H) 10/23/2018 08:53 AM   CHOL 227 11/15/2012   TRIG 91 10/23/2018 08:53 AM   TRIG 130 11/15/2012   HDL 71 10/23/2018 08:53 AM   CHOLHDL 3.0 10/23/2018 08:53 AM   CHOLHDL 4 03/27/2018 10:35 AM   LDLCALC 126 (H) 10/23/2018 08:53 AM   LDLDIRECT 112.0 03/27/2018 10:35 AM    Wt Readings from Last 3 Encounters:  11/19/18 154 lb 12.8 oz (70.2 kg)  11/05/18 155 lb 1 oz (70.3 kg)  10/08/18 155 lb (70.3 kg)     Objective:    Vital Signs:  There were no vitals taken for this visit.   {[facility] Virtual Exam (Optional):[phone]::"VITAL SIGNS:  reviewed"}  ASSESSMENT & PLAN:    1. ***  COVID-19 Education: The signs and symptoms of COVID-19 were discussed with the patient and how to seek care for testing (follow up with PCP or arrange E-visit).  ***The importance of social distancing was discussed today.  Time:   Today, I have spent *** minutes with the patient with telehealth technology discussing the above problems.     Medication Adjustments/Labs and Tests  Ordered: Current medicines are reviewed at length with the patient today.  Concerns regarding medicines are outlined above.   Tests Ordered: No orders of the defined types were placed in this encounter.   Medication Changes: No orders of the defined types were placed in this encounter.   Disposition:  Follow up {follow up:15908}  Signed, [name], MD  02/04/2019 10:12 AM    [place]

## 2019-02-04 NOTE — Telephone Encounter (Signed)
LVM for patient to call back so I can get him prepared for his virtual visit with Dr. Gwenlyn Found.

## 2019-02-04 NOTE — Telephone Encounter (Signed)
Patient missed his appointment for today with Dr. Gwenlyn Found and finally called me back to get that appointment rescheduled to Friday February 14, 2019 at 10:45 AM.

## 2019-02-07 ENCOUNTER — Encounter: Payer: Self-pay | Admitting: *Deleted

## 2019-02-07 ENCOUNTER — Telehealth: Payer: Medicare Other | Admitting: Cardiovascular Disease

## 2019-02-07 DIAGNOSIS — Z951 Presence of aortocoronary bypass graft: Secondary | ICD-10-CM

## 2019-02-14 ENCOUNTER — Other Ambulatory Visit: Payer: Self-pay

## 2019-02-14 ENCOUNTER — Encounter: Payer: Self-pay | Admitting: Cardiovascular Disease

## 2019-02-14 ENCOUNTER — Ambulatory Visit (INDEPENDENT_AMBULATORY_CARE_PROVIDER_SITE_OTHER): Payer: Medicare Other | Admitting: Cardiovascular Disease

## 2019-02-14 DIAGNOSIS — I10 Essential (primary) hypertension: Secondary | ICD-10-CM | POA: Diagnosis not present

## 2019-02-14 DIAGNOSIS — I701 Atherosclerosis of renal artery: Secondary | ICD-10-CM | POA: Diagnosis not present

## 2019-02-14 DIAGNOSIS — Z951 Presence of aortocoronary bypass graft: Secondary | ICD-10-CM

## 2019-02-14 DIAGNOSIS — I739 Peripheral vascular disease, unspecified: Secondary | ICD-10-CM | POA: Diagnosis not present

## 2019-02-14 DIAGNOSIS — I6523 Occlusion and stenosis of bilateral carotid arteries: Secondary | ICD-10-CM

## 2019-02-14 DIAGNOSIS — E782 Mixed hyperlipidemia: Secondary | ICD-10-CM

## 2019-02-14 NOTE — Progress Notes (Signed)
02/14/2019 [name]   Apr 11, 1941  [id number]  Primary Physician Tonia Ghent, MD Primary Cardiologist: [name] MD [name], [name]  HPI:  Ryan Pittman is a 78 y.o.   mildly overweight married Caucasian male father of 2, grandfather for grandchildren who is accompanied by [name] today. He is referred by Dr. Elsie Stain for cardiovascular evaluation because of chest pain.  I last saw him in the office 10/08/2018.His risk factors include 50 pack years of tobacco abuse having quit 8 years ago as well as treated hypertension and hyperlipidemia. There is no family history. He is never had a heart attack or stroke. He does complain of some chest pain that began several months ago occurring on a monthly basis lasting minutes at a time. He does have a history of PAD status post left femoropopliteal bypass grafting several years ago by Dr. Sherron Flemings.  He had a positive stress test and subsequent coronary CTA on 05/24/2018 revealing a high calcium score of 2858 with three-vessel calcification.  He ultimately underwent cardiac catheterization by myself 06/24/2018 revealing three-vessel disease with preserved LV function.  He ultimately underwent CABG x5 by Dr. Cyndia Bent 07/11/2018 and has done well since.  He participated in cardiac rehab.    Since I saw him in the office 4 months ago he is done well.  He denies chest pain or shortness of breath.  He does have some restless leg syndrome.  His cholesterol is elevated despite being on pravastatin and we will look into the possibility of Repatha.  He also complains of some mild right calf claudication which is been chronic.  Dopplers performed in December of last year did show a patent left femoropopliteal bypass graft with the possibility of an occluded distal right common femoral artery.    Current Meds  Medication Sig  . amLODipine (NORVASC) 2.5 MG tablet Take 2.5 mg by mouth daily.  . clopidogrel  (PLAVIX) 75 MG tablet TAKE ONE TABLET EVERY DAY  . Co-Enzyme Q-10 100 MG CAPS Take by mouth.  . levothyroxine (SYNTHROID, LEVOTHROID) 75 MCG tablet Take 75 mcg by mouth daily before breakfast. Takes 1.5 tabs every Thursday per pt  . metoprolol tartrate (LOPRESSOR) 25 MG tablet Take 0.5 tablets (12.5 mg total) by mouth 2 (two) times daily.  . Omega-3 Fatty Acids (FISH OIL OMEGA-3 PO) Take 1 capsule by mouth daily.   . pravastatin (PRAVACHOL) 40 MG tablet Take 1 tablet (40 mg total) by mouth daily.  . tamsulosin (FLOMAX) 0.4 MG CAPS capsule Take 1 capsule (0.4 mg total) by mouth daily.  . traZODone (DESYREL) 100 MG tablet TAKE 1/2 TO 1 TABLET BY MOUTH AT BEDTIMEAS NEEDED FOR SLEEP  . vitamin C (ASCORBIC ACID) 500 MG tablet Take 500 mg by mouth daily.     Allergies  Allergen Reactions  . Penicillins Itching and Other (See Comments)    PATIENT HAS HAD A PCN REACTION WITH IMMEDIATE RASH, FACIAL/TONGUE/THROAT SWELLING, SOB, OR LIGHTHEADEDNESS WITH HYPOTENSION:  #  #  YES  #  #  Has patient had a PCN reaction causing severe rash involving mucus membranes or skin necrosis: No Has patient had a PCN reaction that required hospitalization: No Has patient had a PCN reaction occurring within the last 10 years: No If all of the above answers are "NO", then may proceed with Cephalosporin use.   . Ace Inhibitors Cough  . Aspirin Other (See Comments)    H/o GI bleed  .  Celebrex [Celecoxib] Other (See Comments)    GI bleed  . Lipitor [Atorvastatin] Other (See Comments)    myalgias    Social History   Socioeconomic History  . Marital status: Married    Spouse name: Not on file  . Number of children: Not on file  . Years of education: Not on file  . Highest education level: Not on file  Occupational History  . Not on file  Social Needs  . Financial resource strain: Not on file  . Food insecurity    Worry: Not on file    Inability: Not on file  . Transportation needs    Medical: Not on file     Non-medical: Not on file  Tobacco Use  . Smoking status: Former Smoker    Types: Cigarettes    Quit date: 09/04/2008    Years since quitting: 10.4  . Smokeless tobacco: Former Systems developer    Types: Chew  . Tobacco comment: HAs quit tobacco products 2009  Substance and Sexual Activity  . Alcohol use: Yes    Alcohol/week: 0.0 standard drinks    Comment: Occasionally  . Drug use: No  . Sexual activity: Yes  Lifestyle  . Physical activity    Days per week: Not on file    Minutes per session: Not on file  . Stress: Not on file  Relationships  . Social Herbalist on phone: Not on file    Gets together: Not on file    Attends religious service: Not on file    Active member of club or organization: Not on file    Attends meetings of clubs or organizations: Not on file    Relationship status: Not on file  . Intimate partner violence    Fear of current or ex partner: Not on file    Emotionally abused: Not on file    Physically abused: Not on file    Forced sexual activity: Not on file  Other Topics Concern  . Not on file  Social History Narrative   Married 50+ years, 2 kids   Runs [employer]   Likes to play golf     Review of Systems: General: negative for chills, fever, night sweats or weight changes.  Cardiovascular: negative for chest pain, dyspnea on exertion, edema, orthopnea, palpitations, paroxysmal nocturnal dyspnea or shortness of breath Dermatological: negative for rash Respiratory: negative for cough or wheezing Urologic: negative for hematuria Abdominal: negative for nausea, vomiting, diarrhea, bright red blood per rectum, melena, or hematemesis Neurologic: negative for visual changes, syncope, or dizziness All other systems reviewed and are otherwise negative except as noted above.    Blood pressure (!) 124/56, pulse 69, temperature 98.2 F (36.8 C), height 5\' 7"  (1.702 m), weight 150 lb (68 kg).  General appearance: alert and no distress Neck: no  adenopathy, no carotid bruit, no JVD, supple, symmetrical, trachea midline and thyroid not enlarged, symmetric, no tenderness/mass/nodules Lungs: clear to auscultation bilaterally Heart: regular rate and rhythm, S1, S2 normal, no murmur, click, rub or gallop Extremities: extremities normal, atraumatic, no cyanosis or edema Pulses: 2+ and symmetric Skin: Skin color, texture, turgor normal. No rashes or lesions Neurologic: Alert and oriented X 3, normal strength and tone. Normal symmetric reflexes. Normal coordination and gait  EKG not performed today  ASSESSMENT AND PLAN:   PVD (peripheral vascular disease) ([place]) History of PAD status post remote left femoropopliteal bypass grafting by Dr. do in [place].  He does have chronic right calf  claudication claudication with Dopplers performed 08/05/2018 revealing a right ABI 0.78 and a left 2.9.  His left femoropopliteal bypass graft appeared patent.  He did have possible occlusion of the distal common femoral on the right as well as the posterior tibial artery.  While this is mildly lifestyle limiting I think at this time we will continue to follow him conservatively.  Carotid stenosis Mild bilateral ICA stenosis by duplex ultrasound 02/10/2018.  HTN (hypertension) History of essential hypertension with blood pressure measured today at 124/56.  He is on amlodipine, metoprolol.  Hyperlipidemia History of hyperlipidemia on pravastatin with lipid profile performed 10/23/2018 revealing total cholesterol 215, LDL 126 and HDL 71.  He is a candidate for Repatha which we will pursue  Renal artery stenosis ([facility]) History of moderate bilateral renal artery stenosis by duplex ultrasound performed 08/05/2018.  Hx of CABG History of CAD status post CABG x5 by Dr. Cyndia Bent 07/11/2018 after cardiac catheterization performed by myself 06/24/2018 revealed three-vessel disease.  He did participate in cardiac rehab and is doing well.      [name] MD  FACP,FACC,FAHA, [facility] 02/14/2019 11:42 AM

## 2019-02-14 NOTE — Assessment & Plan Note (Signed)
History of moderate bilateral renal artery stenosis by duplex ultrasound performed 08/05/2018.

## 2019-02-14 NOTE — Assessment & Plan Note (Signed)
History of PAD status post remote left femoropopliteal bypass grafting by Dr. do in [place].  He does have chronic right calf claudication claudication with Dopplers performed 08/05/2018 revealing a right ABI 0.78 and a left 2.9.  His left femoropopliteal bypass graft appeared patent.  He did have possible occlusion of the distal common femoral on the right as well as the posterior tibial artery.  While this is mildly lifestyle limiting I think at this time we will continue to follow him conservatively.

## 2019-02-14 NOTE — Assessment & Plan Note (Signed)
History of hyperlipidemia on pravastatin with lipid profile performed 10/23/2018 revealing total cholesterol 215, LDL 126 and HDL 71.  He is a candidate for Repatha which we will pursue

## 2019-02-14 NOTE — Patient Instructions (Signed)
Medication Instructions:  Your physician recommends that you continue on your current medications as directed. Please refer to the Current Medication list given to you today.  If you need a refill on your cardiac medications before your next appointment, please call your pharmacy.   Lab work: NONE If you have labs (blood work) drawn today and your tests are completely normal, you will receive your results only by: [name] MyChart Message (if you have MyChart) OR . A paper copy in the mail If you have any lab test that is abnormal or we need to change your treatment, we will call you to review the results.  Testing/Procedures: NONE  Follow-Up: At [facility], you and your health needs are our priority.  As part of our continuing mission to provide you with exceptional heart care, we have created designated Provider Care Teams.  These Care Teams include your primary Cardiologist (physician) and Advanced Practice Providers (APPs -  Physician Assistants and Nurse Practitioners) who all work together to provide you with the care you need, when you need it. You will need a follow up appointment in 6 months WITH DR. Gwenlyn Found.  Please call our office 2 months in advance to schedule this appointment.     Any Other Special Instructions Will Be Listed Below (If Applicable). YOU WILL BE CONTACTED BY A CLINICAL PHARMACIST TO DISCUSS CANDIDACY FOR REPATHA, A MEDICATION THAT LOWERS "BAD" CHOLESTEROL.

## 2019-02-14 NOTE — Assessment & Plan Note (Signed)
History of CAD status post CABG x5 by Dr. Cyndia Bent 07/11/2018 after cardiac catheterization performed by myself 06/24/2018 revealed three-vessel disease.  He did participate in cardiac rehab and is doing well.

## 2019-02-14 NOTE — Assessment & Plan Note (Signed)
Mild bilateral ICA stenosis by duplex ultrasound 02/10/2018.

## 2019-02-14 NOTE — Assessment & Plan Note (Signed)
History of essential hypertension with blood pressure measured today at 124/56.  He is on amlodipine, metoprolol.

## 2019-02-17 ENCOUNTER — Telehealth: Payer: Self-pay | Admitting: Pharmacist Clinician (PhC)/ Clinical Pharmacy Specialist

## 2019-02-17 MED ORDER — REPATHA SURECLICK 140 MG/ML ~~LOC~~ SOAJ: 140.0000 mg | SUBCUTANEOUS | 12 refills | Status: DC

## 2019-02-17 NOTE — Telephone Encounter (Signed)
PA for Repatha 140 mg approved to 02/14/20

## 2019-02-18 ENCOUNTER — Other Ambulatory Visit: Payer: Self-pay | Admitting: Cardiology

## 2019-02-20 ENCOUNTER — Telehealth: Payer: Medicare Other | Admitting: Cardiovascular Disease

## 2019-02-20 DIAGNOSIS — Z951 Presence of aortocoronary bypass graft: Secondary | ICD-10-CM

## 2019-02-24 ENCOUNTER — Other Ambulatory Visit: Payer: Self-pay | Admitting: Cardiology

## 2019-03-19 ENCOUNTER — Encounter: Payer: Self-pay | Admitting: *Deleted

## 2019-03-19 DIAGNOSIS — Z951 Presence of aortocoronary bypass graft: Secondary | ICD-10-CM

## 2019-03-19 NOTE — Progress Notes (Signed)
Cardiac Individual Treatment Plan  Patient Details  Name: Ryan Pittman MRN: [id number] Date of Birth: 1941-06-29 Referring Provider:     Cardiac Rehab from 09/23/2018 in [facility] Cardiac and Pulmonary Rehab  Referring Provider  [name] MD      Initial Encounter Date:    Cardiac Rehab from 09/23/2018 in [facility] Cardiac and Pulmonary Rehab  Date  09/23/18      Visit Diagnosis: S/P CABG x 3  Patient's Home Medications on Admission:  Current Outpatient Medications:  .  amiodarone (PACERONE) 200 MG tablet, TAKE 1 TABLET BY MOUTH DAILY, Disp: 90 tablet, Rfl: 3 .  amLODipine (NORVASC) 2.5 MG tablet, Take 2.5 mg by mouth daily., Disp: , Rfl:  .  clopidogrel (PLAVIX) 75 MG tablet, TAKE ONE TABLET EVERY DAY, Disp: 90 tablet, Rfl: 3 .  Co-Enzyme Q-10 100 MG CAPS, Take by mouth., Disp: , Rfl:  .  Evolocumab (REPATHA SURECLICK) 841 MG/ML SOAJ, Inject 140 mg into the skin every 14 (fourteen) days., Disp: 2 pen, Rfl: 12 .  levothyroxine (SYNTHROID, LEVOTHROID) 75 MCG tablet, Take 75 mcg by mouth daily before breakfast. Takes 1.5 tabs every Thursday per pt, Disp: , Rfl:  .  metoprolol tartrate (LOPRESSOR) 25 MG tablet, Take 0.5 tablets (12.5 mg total) by mouth 2 (two) times daily., Disp: 30 tablet, Rfl: 10 .  Omega-3 Fatty Acids (FISH OIL OMEGA-3 PO), Take 1 capsule by mouth daily. , Disp: , Rfl:  .  pravastatin (PRAVACHOL) 40 MG tablet, Take 1 tablet (40 mg total) by mouth daily., Disp: 90 tablet, Rfl: 3 .  tamsulosin (FLOMAX) 0.4 MG CAPS capsule, Take 1 capsule (0.4 mg total) by mouth daily., Disp: 30 capsule, Rfl: 11 .  traZODone (DESYREL) 100 MG tablet, TAKE 1/2 TO 1 TABLET BY MOUTH AT BEDTIMEAS NEEDED FOR SLEEP, Disp: 30 tablet, Rfl: 12 .  vitamin C (ASCORBIC ACID) 500 MG tablet, Take 500 mg by mouth daily., Disp: , Rfl:   Past Medical History: Past Medical History:  Diagnosis Date  . Anemia    due to GIB, s/p transfusion  . Anginal pain ([place])   . Arthritis    back pain, much worse  after consecutive golf rounds  . Cancer ([place])    thyroid  . Chronic kidney disease   . Colon polyps   . Coronary artery disease   . Dyspnea    walking up a hill  . GERD (gastroesophageal reflux disease)   . Hypercalcemia    h/o, resolved as of 2012, prev due to high amount of calcium intake  . Hyperlipidemia   . Hypertension   . IgG gammopathy    stable as of 2012 per [facility]  . Pneumonia    as a child  . PVD (peripheral vascular disease) ([place])    L leg bypass, R leg stented    Tobacco Use: Social History   Tobacco Use  Smoking Status Former Smoker  . Types: Cigarettes  . Quit date: 09/04/2008  . Years since quitting: 10.5  Smokeless Tobacco Former Systems developer  . Types: Chew  Tobacco Comment   HAs quit tobacco products 2009    Labs: Recent Review Flowsheet Data    Labs for ITP Cardiac and Pulmonary Rehab Latest Ref Rng & Units 07/11/2018 07/11/2018 07/12/2018 07/14/2018 10/23/2018   Cholestrol 100 - 199 mg/dL - - - - 215(H)   LDLCALC 0 - 99 mg/dL - - - - 126(H)   LDLDIRECT mg/dL - - - - -   HDL >39 mg/dL - - - -  71   Trlycerides 0 - 149 mg/dL - - - - 91   Hemoglobin A1c 4.8 - 5.6 % - - - - -   PHART 7.350 - 7.450 7.342(L) - - - -   PCO2ART 32.0 - 48.0 mmHg 31.9(L) - - - -   HCO3 20.0 - 28.0 mmol/L 17.3(L) - - - -   TCO2 22 - 32 mmol/L 18(L) 19(L) 25 - -   ACIDBASEDEF 0.0 - 2.0 mmol/L 8.0(H) - - - -   O2SAT % 97.0 - - 56.3 -       Exercise Target Goals: Exercise Program Goal: Individual exercise prescription set using results from initial 6 min walk test and THRR while considering  patient's activity barriers and safety.   Exercise Prescription Goal: Initial exercise prescription builds to 30-45 minutes a day of aerobic activity, 2-3 days per week.  Home exercise guidelines will be given to patient during program as part of exercise prescription that the participant will acknowledge.  Activity Barriers & Risk Stratification: Activity Barriers & Cardiac Risk Stratification  - 09/23/18 1235      Activity Barriers & Cardiac Risk Stratification   Activity Barriers  Arthritis;Back Problems;Joint Problems   Arthritis in hands/finger and back. Hand joints not flexible because of arthritis   Cardiac Risk Stratification  High       6 Minute Walk: 6 Minute Walk    Row Name 09/23/18 1511         6 Minute Walk   Phase  Initial     Distance  1204 feet     Walk Time  6 minutes     # of Rest Breaks  0     MPH  2.28     METS  1.82     RPE  13     VO2 Peak  6.38     Symptoms  Yes (comment)     Comments  PAD  Claudication Pain Score 4/5     Resting HR  52 bpm     Resting BP  126/72     Resting Oxygen Saturation   96 %     Exercise Oxygen Saturation  during 6 min walk  99 %     Max Ex. HR  84 bpm     Max Ex. BP  154/84     2 Minute Post BP  142/64        Oxygen Initial Assessment:   Oxygen Re-Evaluation:   Oxygen Discharge (Final Oxygen Re-Evaluation):   Initial Exercise Prescription: Initial Exercise Prescription - 09/23/18 1500      Date of Initial Exercise RX and Referring Provider   Date  09/23/18    Referring Provider  [name] MD      Treadmill   MPH  2    Grade  0.5    Minutes  15    METs  2.67      Bike   Level  0.3    Minutes  15    METs  1.8      T5 Nustep   Level  1    SPM  80    Minutes  15    METs  2.5      Prescription Details   Frequency (times per week)  2    Duration  Progress to 30 minutes of continuous aerobic without signs/symptoms of physical distress      Intensity   THRR 40-80% of Max Heartrate  88-123  Ratings of Perceived Exertion  11-13    Perceived Dyspnea  0-4      Progression   Progression  Continue to progress workloads to maintain intensity without signs/symptoms of physical distress.      Resistance Training   Training Prescription  Yes    Weight  3 lbs    Reps  10-15       Perform Capillary Blood Glucose checks as needed.  Exercise Prescription Changes: Exercise  Prescription Changes    Row Name 09/23/18 1200 10/08/18 1300 10/23/18 0900 10/24/18 1000 11/06/18 1500     Response to Exercise   Blood Pressure (Admit)  126/72  106/60  126/60  -  154/60   Blood Pressure (Exercise)  154/84  150/78  132/62  -  156/60   Blood Pressure (Exit)  142/64  128/60  142/62  -  132/62   Heart Rate (Admit)  52 bpm  60 bpm  74 bpm  -  71 bpm   Heart Rate (Exercise)  84 bpm  116 bpm  109 bpm  -  89 bpm   Heart Rate (Exit)  54 bpm  56 bpm  66 bpm  -  61 bpm   Oxygen Saturation (Admit)  96 %  -  -  -  -   Oxygen Saturation (Exercise)  99 %  -  -  -  -   Rating of Perceived Exertion (Exercise)  '13  13  12  ' -  15   Symptoms  CPS 4/5  none  none  -  none   Comments  walk test results  -  -  -  -   Duration  -  Continue with 30 min of aerobic exercise without signs/symptoms of physical distress.  Continue with 30 min of aerobic exercise without signs/symptoms of physical distress.  -  Continue with 30 min of aerobic exercise without signs/symptoms of physical distress.   Intensity  -  THRR unchanged  THRR unchanged  -  THRR unchanged     Progression   Progression  -  Continue to progress workloads to maintain intensity without signs/symptoms of physical distress.  Continue to progress workloads to maintain intensity without signs/symptoms of physical distress.  -  Continue to progress workloads to maintain intensity without signs/symptoms of physical distress.   Average METs  -  1.78  3.12  -  2.56     Resistance Training   Training Prescription  -  Yes  Yes  -  Yes   Weight  -  3 lbs  4 lbs  -  5 lbs   Reps  -  10-15  10-15  -  10-15     Interval Training   Interval Training  -  No  No  -  No     Treadmill   MPH  -  1  2  -  2   Grade  -  0  0.5  -  0.5   Minutes  -  15  15  -  15   METs  -  1.77  2.67  -  2.67     Bike   Level  -  -  1  -  1   Watts  -  -  -  -  25   Minutes  -  -  35  -  15   METs  -  -  3.57  -  3.12     T5 Nustep  Level  -  1  -  -  1    Minutes  -  15  -  -  15   METs  -  1.8  -  -  1.9     Home Exercise Plan   Plans to continue exercise at  -  -  -  Home (comment) walking  Home (comment) walking   Frequency  -  -  -  Add 3 additional days to program exercise sessions.  Add 3 additional days to program exercise sessions.   Initial Home Exercises Provided  -  -  -  10/24/18  10/24/18   Row Name 11/20/18 0900             Response to Exercise   Blood Pressure (Admit)  126/64       Blood Pressure (Exercise)  136/70       Blood Pressure (Exit)  104/60       Heart Rate (Admit)  58 bpm       Heart Rate (Exercise)  78 bpm       Heart Rate (Exit)  53 bpm       Rating of Perceived Exertion (Exercise)  14       Symptoms  none       Duration  Continue with 30 min of aerobic exercise without signs/symptoms of physical distress.       Intensity  THRR unchanged         Progression   Progression  Continue to progress workloads to maintain intensity without signs/symptoms of physical distress.       Average METs  2.64         Resistance Training   Training Prescription  Yes       Weight  5 lbs       Reps  10-15         Interval Training   Interval Training  No         Treadmill   MPH  2       Grade  0.5       Minutes  15       METs  2.67         Bike   Level  1       Watts  32       Minutes  15       METs  3.64         T5 Nustep   Level  1       Minutes  15       METs  1.9         Home Exercise Plan   Plans to continue exercise at  Home (comment) walking       Frequency  Add 3 additional days to program exercise sessions.       Initial Home Exercises Provided  10/24/18          Exercise Comments: Exercise Comments    Row Name 10/01/18 0932           Exercise Comments  :First full day of exercise!  Patient was oriented to gym and equipment including functions, settings, policies, and procedures.  Patient's individual exercise prescription and treatment plan were reviewed.  All starting workloads  were established based on the results of the 6 minute walk test done at initial orientation visit.  The plan for exercise progression was also introduced and progression will be customized based on patient's performance  and goals.          Exercise Goals and Review: Exercise Goals    Row Name 09/23/18 1517             Exercise Goals   Increase Physical Activity  Yes       Intervention  Provide advice, education, support and counseling about physical activity/exercise needs.;Develop an individualized exercise prescription for aerobic and resistive training based on initial evaluation findings, risk stratification, comorbidities and participant's personal goals.       Expected Outcomes  Short Term: Attend rehab on a regular basis to increase amount of physical activity.;Long Term: Add in home exercise to make exercise part of routine and to increase amount of physical activity.;Long Term: Exercising regularly at least 3-5 days a week.       Increase Strength and Stamina  Yes       Intervention  Provide advice, education, support and counseling about physical activity/exercise needs.;Develop an individualized exercise prescription for aerobic and resistive training based on initial evaluation findings, risk stratification, comorbidities and participant's personal goals.       Expected Outcomes  Short Term: Increase workloads from initial exercise prescription for resistance, speed, and METs.;Short Term: Perform resistance training exercises routinely during rehab and add in resistance training at home;Long Term: Improve cardiorespiratory fitness, muscular endurance and strength as measured by increased METs and functional capacity (6MWT)       Able to understand and use rate of perceived exertion (RPE) scale  Yes       Intervention  Provide education and explanation on how to use RPE scale       Expected Outcomes  Short Term: Able to use RPE daily in rehab to express subjective intensity level;Long  Term:  Able to use RPE to guide intensity level when exercising independently       Knowledge and understanding of Target Heart Rate Range (THRR)  Yes       Intervention  Provide education and explanation of THRR including how the numbers were predicted and where they are located for reference       Expected Outcomes  Short Term: Able to state/look up THRR;Short Term: Able to use daily as guideline for intensity in rehab;Long Term: Able to use THRR to govern intensity when exercising independently       Able to check pulse independently  Yes       Intervention  Provide education and demonstration on how to check pulse in carotid and radial arteries.;Review the importance of being able to check your own pulse for safety during independent exercise       Expected Outcomes  Short Term: Able to explain why pulse checking is important during independent exercise;Long Term: Able to check pulse independently and accurately       Understanding of Exercise Prescription  Yes       Intervention  Provide education, explanation, and written materials on patient's individual exercise prescription       Expected Outcomes  Short Term: Able to explain program exercise prescription;Long Term: Able to explain home exercise prescription to exercise independently          Exercise Goals Re-Evaluation : Exercise Goals Re-Evaluation    Row Name 10/01/18 0932 10/08/18 1337 10/23/18 0919 10/24/18 1059 11/06/18 1519     Exercise Goal Re-Evaluation   Exercise Goals Review  Increase Physical Activity;Increase Strength and Stamina;Able to understand and use rate of perceived exertion (RPE) scale;Knowledge and understanding of Target Heart Rate Range (  THRR);Understanding of Exercise Prescription  Increase Physical Activity;Increase Strength and Stamina;Understanding of Exercise Prescription  Increase Physical Activity;Increase Strength and Stamina;Understanding of Exercise Prescription  Increase Physical Activity;Increase  Strength and Stamina;Understanding of Exercise Prescription;Able to understand and use rate of perceived exertion (RPE) scale;Knowledge and understanding of Target Heart Rate Range (THRR);Able to check pulse independently  Increase Physical Activity;Increase Strength and Stamina;Understanding of Exercise Prescription   Comments  Reviewed RPE scale, THR and program prescription with pt today.  Pt voiced understanding and was given a copy of goals to take home.   Ryan Pittman has completed his first two full days of exericse. He has done well so far.  We will continue to montior his progress.   Ryan Pittman has been doing well in rehab.  He is up to 2.0 mph on treadmill.  We will continue to monitor his progress.  Reviewed home exercise with pt today.  Pt plans to walk and use weights at home for exercise.  Reviewed THR, pulse, RPE, sign and symptoms, NTG use, and when to call 911 or MD.  Also discussed weather considerations and indoor options.  Pt voiced understanding.  Ariyan continues to do well in rehab.  He is now using 5 lbs handweights.  We will continue to encourage him to increase his workloads and monitor his progress.    Expected Outcomes  Short: Use RPE daily to regulate intensity. Long: Follow program prescription in THR.  Short: Continue to attend class regularly.  Long: Continue to follow program prescription.   Short: Increase workloads on seated equipment.  Long:  Continue to build strength and stamina  Short: Start to add in exercise at home.  Long: Continue to build strength and stamina.   Short: Increase workloads.  Long: Continue to build stamina.    [place] Name 11/12/18 4782 11/20/18 0948 12/05/18 0923 12/25/18 1322 01/16/19 1154     Exercise Goal Re-Evaluation   Exercise Goals Review  Increase Physical Activity;Increase Strength and Stamina;Understanding of Exercise Prescription  Increase Physical Activity;Increase Strength and Stamina;Understanding of Exercise Prescription  Increase Physical  Activity;Increase Strength and Stamina;Understanding of Exercise Prescription  Increase Physical Activity;Increase Strength and Stamina;Understanding of Exercise Prescription  Increase Physical Activity;Increase Strength and Stamina;Understanding of Exercise Prescription   Comments  Ryan Pittman has been doing well in rehab. He is getting in his home exercise for 30 min at home about 4-5 times a week.  He is starting to get back his strength and stamina.  Ryan Pittman has been doing well in rehab.  He is getting 32 watts on the bike.  He will be walking and using videos at home.  We will continue to monitor his progress.   Ryan Pittman has been doing well at home.  He is getting in some walking, but not as much as he should.  He also tries to do his stairs a few times a day to keep his legs strong.    Ryan Pittman continues to do well at home.  He is getting in his exercise and walking some.  He knows he should do more, but is happy where he is.   Ryan Pittman continues to walk some.  He was headed to beach when we talked and usually walks more when he is down there.    Expected Outcomes  Short: Continue to increase workloads.  Long: Continue to build stamina.   Short: Continue to walk at home.  Long: Continue to build strength and stamina.   Short: Continue to walk at home and try to go  more often.  Long: Continue to rebuild stamina.   Short: Continue to try to get in a little more exercise. Long: Continue to rebuild activity levles.   Short: Try to walk more.  Long: Continue to increase activity.    [place] Name 02/07/19 1426             Exercise Goal Re-Evaluation   Exercise Goals Review  Improve claudication pain tolerance and improve walking ability       Comments  Ryan Pittman has been staying busy with work.  It helps him to stay active by going up and down the stairs.  He has not been doing much actual walking but will try.        Expected Outcomes  Short: Get back into walking.  Long: Continue to be active.            Discharge Exercise Prescription (Final Exercise Prescription Changes): Exercise Prescription Changes - 11/20/18 0900      Response to Exercise   Blood Pressure (Admit)  126/64    Blood Pressure (Exercise)  136/70    Blood Pressure (Exit)  104/60    Heart Rate (Admit)  58 bpm    Heart Rate (Exercise)  78 bpm    Heart Rate (Exit)  53 bpm    Rating of Perceived Exertion (Exercise)  14    Symptoms  none    Duration  Continue with 30 min of aerobic exercise without signs/symptoms of physical distress.    Intensity  THRR unchanged      Progression   Progression  Continue to progress workloads to maintain intensity without signs/symptoms of physical distress.    Average METs  2.64      Resistance Training   Training Prescription  Yes    Weight  5 lbs    Reps  10-15      Interval Training   Interval Training  No      Treadmill   MPH  2    Grade  0.5    Minutes  15    METs  2.67      Bike   Level  1    Watts  32    Minutes  15    METs  3.64      T5 Nustep   Level  1    Minutes  15    METs  1.9      Home Exercise Plan   Plans to continue exercise at  Home (comment)   walking   Frequency  Add 3 additional days to program exercise sessions.    Initial Home Exercises Provided  10/24/18       Nutrition:  Target Goals: Understanding of nutrition guidelines, daily intake of sodium <1574m, cholesterol <2053m calories 30% from fat and 7% or less from saturated fats, daily to have 5 or more servings of fruits and vegetables.  Biometrics: Pre Biometrics - 09/23/18 1517      Pre Biometrics   Height  5' 6.1" (1.679 m)    Weight  154 lb 6.4 oz (70 kg)    Waist Circumference  37.5 inches    Hip Circumference  36.5 inches    Waist to Hip Ratio  1.03 %    BMI (Calculated)  24.84    Single Leg Stand  8.01 seconds        Nutrition Therapy Plan and Nutrition Goals: Nutrition Therapy & Goals - 11/20/18 1209      Nutrition Therapy   Diet  Heart Healthy Low Sodium  1400-1600kcal (MSJ 70kg, 170cm, 77yo) BMI:24, waist: 37.5 inches (<40)    Protein (specify units)  55-60 grams    Fiber  30 grams   30-38g   Whole Grain Foods  3 servings   pt eats packet of oatmeal each morning and tries to eat whole grain bread   Saturated Fats  12 max. grams   <12-16g (<7% of kcal)   Fruits and Vegetables  5 servings/day   patient tries to eat things like lima beans, black eyed peas, turnip greens, bananas, blueberries   Sodium  1.5 grams   1537m     Personal Nutrition Goals   Nutrition Goal  ST: Continue to make healthy changes such as incorporating F/V, whole grains, lean meats, etc. LT: lower cholesterol (patient reports Dr. told him his cholesterol was getting high and he needs to work on it (11/19/18) pt reports that he is now on more medication to lower cholesterol)    Comments  Patient reports eating more grilled chicken and salmon and less pork and country ham (which he loves). Pt reports that he does not want to lose weight (his weight is WNL). Pt reports coninuing with exercise at home. Discussed MyPlate with patient and his kcal and protein goals; pt seems to be doing well with goals set by team.       Intervention Plan   Intervention  Prescribe, educate and counsel regarding individualized specific dietary modifications aiming towards targeted core components such as weight, hypertension, lipid management, diabetes, heart failure and other comorbidities.    Expected Outcomes  Short Term Goal: A plan has been developed with personal nutrition goals set during dietitian appointment.;Long Term Goal: Adherence to prescribed nutrition plan.       Nutrition Assessments: Nutrition Assessments - 09/23/18 1240      MEDFICTS Scores   Pre Score  12       Nutrition Goals Re-Evaluation: Nutrition Goals Re-Evaluation    Row Name 10/10/18 1019 11/12/18 0953 02/20/19 1115         Goals   Current Weight  151 lb (68.5 kg)  -  -     Nutrition Goal  Eat healthier   Heart Health Diet  ST: increase F/V intake, choose lean protein and whole grains LT: lower cholesterol     Comment  Patient has been eating less red meats and fried foods. He drinks plenty of water throughout the day. Ryan Pittman will try not to overeat when he skips a meal and do better on portion control.  Ryan Pittman has been eating better.  He has avoided his country ham but misses it.  We talked about having it on occasion.  He continues to work on portion control.  He continues to get plenty of water and tries for more fruits and vegetables.   pt reports that he is going to get medication to help lower cholesterol (just came into pharmacy). Pt didn't seem interested in making changes or additional goals. He reported that since we last spoke he didn't make any changes, but he reports it's because everything is closed. He will continue to try to reach goals set. based on evaluation, do not believ pt is motivated/ready to make any change. Will try again when speak to him next.     Expected Outcome  Short: decrease portion size. Long: lose a few pounds and maintain weight.  Short: Continue to work on portion size.  Long: Continue to work on weight loss.  Lower cholesterol and make changes        Nutrition Goals Discharge (Final Nutrition Goals Re-Evaluation): Nutrition Goals Re-Evaluation - 02/20/19 1115      Goals   Nutrition Goal  ST: increase F/V intake, choose lean protein and whole grains LT: lower cholesterol    Comment  pt reports that he is going to get medication to help lower cholesterol (just came into pharmacy). Pt didn't seem interested in making changes or additional goals. He reported that since we last spoke he didn't make any changes, but he reports it's because everything is closed. He will continue to try to reach goals set. based on evaluation, do not believ pt is motivated/ready to make any change. Will try again when speak to him next.    Expected Outcome  Lower cholesterol and make  changes       Psychosocial: Target Goals: Acknowledge presence or absence of significant depression and/or stress, maximize coping skills, provide positive support system. Participant is able to verbalize types and ability to use techniques and skills needed for reducing stress and depression.   Initial Review & Psychosocial Screening: Initial Psych Review & Screening - 09/23/18 1238      Initial Review   Current issues with  None Identified   Does have restless leg syndrome. Some anxiety with it.     Family Dynamics   Good Support System?  Yes   spouse     Barriers   Psychosocial barriers to participate in program  There are no identifiable barriers or psychosocial needs.;The patient should benefit from training in stress management and relaxation.      Screening Interventions   Interventions  Encouraged to exercise;To provide support and resources with identified psychosocial needs;Provide feedback about the scores to participant    Expected Outcomes  Short Term goal: Utilizing psychosocial counselor, staff and physician to assist with identification of specific Stressors or current issues interfering with healing process. Setting desired goal for each stressor or current issue identified.;Long Term Goal: Stressors or current issues are controlled or eliminated.;Short Term goal: Identification and review with participant of any Quality of Life or Depression concerns found by scoring the questionnaire.;Long Term goal: The participant improves quality of Life and PHQ9 Scores as seen by post scores and/or verbalization of changes       Quality of Life Scores:  Quality of Life - 09/23/18 1239      Quality of Life   Select  Quality of Life      Quality of Life Scores   Health/Function Pre  28.2 %    Socioeconomic Pre  30 %    Psych/Spiritual Pre  30 %    Family Pre  27.6 %    GLOBAL Pre  28.85 %      Scores of 19 and below usually indicate a poorer quality of life in these  areas.  A difference of  2-3 points is a clinically meaningful difference.  A difference of 2-3 points in the total score of the Quality of Life Index has been associated with significant improvement in overall quality of life, self-image, physical symptoms, and general health in studies assessing change in quality of life.  PHQ-9: Recent Review Flowsheet Data    Depression screen [facility] 2/9 09/23/2018 03/27/2018 03/22/2017 12/24/2015   Decreased Interest 0 0 0 0   Down, Depressed, Hopeless 0 0 0 0   PHQ - 2 Score 0 0 0 0   Altered sleeping 0 0 - -  Tired, decreased energy 1 0 - -   Change in appetite 0 0 - -   Feeling bad or failure about yourself  0 0 - -   Trouble concentrating 0 0 - -   Moving slowly or fidgety/restless 0 0 - -   Suicidal thoughts 0 0 - -   PHQ-9 Score 1 0 - -   Difficult doing work/chores Somewhat difficult Not difficult at all - -     Interpretation of Total Score  Total Score Depression Severity:  1-4 = Minimal depression, 5-9 = Mild depression, 10-14 = Moderate depression, 15-19 = Moderately severe depression, 20-27 = Severe depression   Psychosocial Evaluation and Intervention: Psychosocial Evaluation - 10/17/18 1457      Psychosocial Evaluation & Interventions   Interventions  Stress management education;Encouraged to exercise with the program and follow exercise prescription    Comments  Counselor met with Ryan Pittman) today for initial psychosocial evaluation.  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  He has a strong suport system with a spouse, son and daughter locally and active involvement in his local church.  Ramaj reports trouble getting  to sleep but typically sleeps 7-8 hours.  He has a good appetite.  Carson denies a history of depression or anxiety or any current symptoms and is typically in a positive mood.  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  He has goals to just "get  through this program" and plans to continue exercising at home.  Staff will follow with him.     Expected Outcomes  Short:  Ryan Pittman will exercise for his health and as a positive coping strategy for stress.  Long:  Ryan Pittman will develop positive  lifestyle habits for his health and mental health.     Continue Psychosocial Services   Follow up required by staff       Psychosocial Re-Evaluation: Psychosocial Re-Evaluation    Row Name 10/10/18 1015 11/12/18 0951 12/05/18 0927 12/25/18 1330 01/16/19 1155     Psychosocial Re-Evaluation   Current issues with  Current Stress Concerns  Current Stress Concerns  Current Stress Concerns  Current Stress Concerns  Current Stress Concerns   Comments  Eean has a business at [employer]. Taxes are coming around and is stressful. He had a bypass and feels positive about his outcome.   Ryan Pittman is doing well in rehab.  He has gotten his taxes done and feeling a little better.  He uses golf and exercise as stress relief.  He is sleeping good.  He has enjoyed getting away from work regularly for exercise.   Ryan Pittman is doing well at home.  He is trying to stay postivie with everything going on. 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  He is not getting enough exercise but is trying to stay active.   Ryan Pittman is doing well at home. 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  He is pleased to be able to continue to do business.  He is sleeping well and in a good place mentally.   Ryan Pittman was on his way to the beach.  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     Expected Outcomes  Short: Attend [facility] regularly. Long: maintain a positive outlook on his health.  Short: Continue to get in exercise for stress relief.  Long: Continue with postive outlook.   Short: Continue to maintain business as able.   Long: Continue to exercise for stress management.   Short: Continue to run business safely.  Long: Contnue to cope positively!  Short: Continue to run business safely.  Long: Contnue to cope positively!   Interventions  Encouraged to attend Cardiac Rehabilitation for the exercise;Stress management education  Encouraged to attend Cardiac Rehabilitation for the exercise;Stress management education  Encouraged to attend Cardiac Rehabilitation for the exercise;Stress management education  Encouraged to attend Cardiac Rehabilitation for the exercise;Stress management education  -   Continue Psychosocial Services   Follow up required by counselor  Follow up required by staff  Follow up required by staff  Follow up required by staff  -   Ryan Pittman Name 02/07/19 1427             Psychosocial Re-Evaluation   Current issues with  Current Stress Concerns       Comments  Ryan Pittman is staying busy and working.  They are limiting their customers and only allowing them in certain areas.  He is still considering the app at this point.        Expected Outcomes  Short: Continue to be safe working. Long: Continue to stay positive.        Interventions  Encouraged to attend Cardiac Rehabilitation for the exercise;Stress management education       Continue Psychosocial Services   Follow up required by staff          Psychosocial Discharge (Final Psychosocial Re-Evaluation): Psychosocial Re-Evaluation - 02/07/19 1427      Psychosocial Re-Evaluation   Current issues with  Current Stress Concerns    Comments  Ryan Pittman is staying busy and working.  They are limiting their customers and only allowing them in certain areas.  He is still considering the app at this point.     Expected Outcomes  Short: Continue to be safe working. Long: Continue to stay positive.     Interventions  Encouraged to attend Cardiac Rehabilitation for the exercise;Stress management education    Continue Psychosocial Services   Follow up required by  staff       Vocational Rehabilitation: Provide vocational rehab assistance to qualifying candidates.   Vocational Rehab Evaluation & Intervention: Vocational Rehab - 09/23/18 1241      Initial Vocational Rehab Evaluation & Intervention   Assessment shows need for Vocational Rehabilitation  No       Education: Education Goals: Education classes will be provided on a variety of topics geared toward better understanding of heart health and risk factor modification. Participant will state understanding/return demonstration of topics presented as noted by education test scores.  Learning Barriers/Preferences: Learning Barriers/Preferences - 09/23/18 1240      Learning Barriers/Preferences   Learning Barriers  None    Learning Preferences  Individual Instruction       Education Topics:  AED/CPR: - Group verbal and written instruction with the use of models to demonstrate the basic use of the AED with the basic ABC's of resuscitation.   Cardiac Rehab from 11/14/2018 in [facility] Cardiac and Pulmonary Rehab  Date  10/29/18  Educator  SB  Instruction Review Code  1- Verbalizes Understanding      General Nutrition Guidelines/Fats and Fiber: -Group instruction provided by verbal, written material, models and posters to present the  general guidelines for heart healthy nutrition. Gives an explanation and review of dietary fats and fiber.   Cardiac Rehab from 11/14/2018 in [facility] Cardiac and Pulmonary Rehab  Date  11/12/18  Educator  [facility]  Instruction Review Code  1- Verbalizes Understanding      Controlling Sodium/Reading Food Labels: -Group verbal and written material supporting the discussion of sodium use in heart healthy nutrition. Review and explanation with models, verbal and written materials for utilization of the food label.   Cardiac Rehab from 11/14/2018 in [facility] Cardiac and Pulmonary Rehab  Date  11/14/18  Educator  [facility]  Instruction Review Code  1- Verbalizes Understanding       Exercise Physiology & General Exercise Guidelines: - Group verbal and written instruction with models to review the exercise physiology of the cardiovascular system and associated critical values. Provides general exercise guidelines with specific guidelines to those with heart or lung disease.    Aerobic Exercise & Resistance Training: - Gives group verbal and written instruction on the various components of exercise. Focuses on aerobic and resistive training programs and the benefits of this training and how to safely progress through these programs..   Flexibility, Balance, Mind/Body Relaxation: Provides group verbal/written instruction on the benefits of flexibility and balance training, including mind/body exercise modes such as yoga, pilates and tai chi.  Demonstration and skill practice provided.   Cardiac Rehab from 11/14/2018 in [facility] Cardiac and Pulmonary Rehab  Date  10/01/18  Educator  AS  Instruction Review Code  1- Verbalizes Understanding      Stress and Anxiety: - Provides group verbal and written instruction about the health risks of elevated stress and causes of high stress.  Discuss the correlation between heart/lung disease and anxiety and treatment options. Review healthy ways to manage with stress and anxiety.   Cardiac Rehab from 11/14/2018 in [facility] Cardiac and Pulmonary Rehab  Date  10/22/18  Educator  [facility]  Instruction Review Code  1- Verbalizes Understanding      Depression: - Provides group verbal and written instruction on the correlation between heart/lung disease and depressed mood, treatment options, and the stigmas associated with seeking treatment.   Anatomy & Physiology of the Heart: - Group verbal and written instruction and models provide basic cardiac anatomy and physiology, with the coronary electrical and arterial systems. Review of Valvular disease and Heart Failure   Cardiac Rehab from 11/14/2018 in [facility] Cardiac and Pulmonary Rehab  Date   10/10/18  Educator  CE  Instruction Review Code  1- Verbalizes Understanding      Cardiac Procedures: - Group verbal and written instruction to review commonly prescribed medications for heart disease. Reviews the medication, class of the drug, and side effects. Includes the steps to properly store meds and maintain the prescription regimen. (beta blockers and nitrates)   Cardiac Rehab from 11/14/2018 in [facility] Cardiac and Pulmonary Rehab  Date  10/24/18  Educator  CE  Instruction Review Code  1- Verbalizes Understanding      Cardiac Medications I: - Group verbal and written instruction to review commonly prescribed medications for heart disease. Reviews the medication, class of the drug, and side effects. Includes the steps to properly store meds and maintain the prescription regimen.   Cardiac Rehab from 11/14/2018 in [facility] Cardiac and Pulmonary Rehab  Date  10/15/18  Educator  SB  Instruction Review Code  1- Verbalizes Understanding      Cardiac Medications II: -Group verbal and written instruction to review commonly prescribed medications  for heart disease. Reviews the medication, class of the drug, and side effects. (all other drug classes)   Cardiac Rehab from 11/14/2018 in [facility] Cardiac and Pulmonary Rehab  Date  10/03/18  Educator  CE  Instruction Review Code  1- Verbalizes Understanding       Go Sex-Intimacy & Heart Disease, Get SMART - Goal Setting: - Group verbal and written instruction through game format to discuss heart disease and the return to sexual intimacy. Provides group verbal and written material to discuss and apply goal setting through the application of the S.M.A.R.T. Method.   Cardiac Rehab from 11/14/2018 in [facility] Cardiac and Pulmonary Rehab  Date  10/24/18  Educator  CE  Instruction Review Code  1- Verbalizes Understanding      Other Matters of the Heart: - Provides group verbal, written materials and models to describe Stable Angina and Peripheral  Artery. Includes description of the disease process and treatment options available to the cardiac patient.   Cardiac Rehab from 11/14/2018 in [facility] Cardiac and Pulmonary Rehab  Date  10/10/18  Educator  CE  Instruction Review Code  1- Verbalizes Understanding      Exercise & Equipment Safety: - Individual verbal instruction and demonstration of equipment use and safety with use of the equipment.   Cardiac Rehab from 11/14/2018 in [facility] Cardiac and Pulmonary Rehab  Date  09/23/18  Educator  [facility]  Instruction Review Code  1- Verbalizes Understanding      Infection Prevention: - Provides verbal and written material to individual with discussion of infection control including proper hand washing and proper equipment cleaning during exercise session.   Cardiac Rehab from 11/14/2018 in [facility] Cardiac and Pulmonary Rehab  Date  09/23/18  Educator  [facility]  Instruction Review Code  1- Verbalizes Understanding      Falls Prevention: - Provides verbal and written material to individual with discussion of falls prevention and safety.   Cardiac Rehab from 11/14/2018 in [facility] Cardiac and Pulmonary Rehab  Date  09/23/18  Educator  [facility]  Instruction Review Code  1- Verbalizes Understanding      Diabetes: - Individual verbal and written instruction to review signs/symptoms of diabetes, desired ranges of glucose level fasting, after meals and with exercise. Acknowledge that pre and post exercise glucose checks will be done for 3 sessions at entry of program.   Know Your Numbers and Risk Factors: -Group verbal and written instruction about important numbers in your health.  Discussion of what are risk factors and how they play a role in the disease process.  Review of Cholesterol, Blood Pressure, Diabetes, and BMI and the role they play in your overall health.   Cardiac Rehab from 11/14/2018 in [facility] Cardiac and Pulmonary Rehab  Date  10/03/18  Educator  CE  Instruction Review Code  1- Verbalizes  Understanding      Sleep Hygiene: -Provides group verbal and written instruction about how sleep can affect your health.  Define sleep hygiene, discuss sleep cycles and impact of sleep habits. Review good sleep hygiene tips.    Other: -Provides group and verbal instruction on various topics (see comments)   Knowledge Questionnaire Score: Knowledge Questionnaire Score - 09/23/18 1241      Knowledge Questionnaire Score   Pre Score  20/26   Reviewed correct responses with Ryan Pittman today. He verbalized understanding of the responses and had no further questions today.       Core Components/Risk Factors/Patient Goals at Admission: Personal Goals and Risk Factors at  Admission - 09/23/18 1241      Core Components/Risk Factors/Patient Goals on Admission   Hypertension  Yes    Intervention  Provide education on lifestyle modifcations including regular physical activity/exercise, weight management, moderate sodium restriction and increased consumption of fresh fruit, vegetables, and low fat dairy, alcohol moderation, and smoking cessation.;Monitor prescription use compliance.    Expected Outcomes  Short Term: Continued assessment and intervention until BP is < 140/12m HG in hypertensive participants. < 130/88mHG in hypertensive participants with diabetes, heart failure or chronic kidney disease.;Long Term: Maintenance of blood pressure at goal levels.    Lipids  Yes    Intervention  Provide education and support for participant on nutrition & aerobic/resistive exercise along with prescribed medications to achieve LDL <707mHDL >81m75m  Expected Outcomes  Short Term: Participant states understanding of desired cholesterol values and is compliant with medications prescribed. Participant is following exercise prescription and nutrition guidelines.;Long Term: Cholesterol controlled with medications as prescribed, with individualized exercise RX and with personalized nutrition plan. Value goals:  LDL < 70mg2mL > 40 mg.       Core Components/Risk Factors/Patient Goals Review:  Goals and Risk Factor Review    Row Name 10/10/18 1024 11/12/18 0954 12/05/18 0926 12/25/18 1326 01/16/19 1155     Core Components/Risk Factors/Patient Goals Review   Personal Goals Review  Lipids;Hypertension  Lipids;Hypertension;Weight Management/Obesity  Hypertension;Weight Management/Obesity  Hypertension;Weight Management/Obesity  Hypertension;Weight Management/Obesity   Review  Patient states that he has not had his cholesterol checked since his surgery and is getting it checked next week. He will report the values back when he finds out. He is eating better and hopes that his values are good. His blood pressure ranges from the 130's to higher 50's.   RichaBraetonbeen doing well in rehab.  His weight has been staying steady and down a little. He is doing well with his blood pressures and plans to see doctor soon and talk to him about his pressures.   He is doing well on his medications.    RichaBanyanbeen doing well at home.  His weight continues to stay pretty steady.  He continues to check his blood pressures daily and has been getting good numbers.   Ryan Pittman well.  He is still maintaining his weight.  He continues to check his pressures and they have been between 120-130s/50-60s.    Ryan Pittman continues to maintain and says his numbers are still looking good!   Expected Outcomes  Short: get lipids checked. Long: maintain lipids within normal range.  Short: Talk to doctor about medication and pressures. Long: Continue to monitor risk factors.   Short: Continue to keep weight down.  Long: Continue to monitor risk factors.   Short: Continue to work on weight loss. Long: Continue to montior risk factors.   Short: Continue to work on weight loss. Long: Continue to montior risk factors.       Core Components/Risk Factors/Patient Goals at Discharge (Final Review):  Goals and Risk Factor Review - 01/16/19 1155       Core Components/Risk Factors/Patient Goals Review   Personal Goals Review  Hypertension;Weight Management/Obesity    Review  RichaRiderinues to maintain and says his numbers are still looking good!    Expected Outcomes  Short: Continue to work on weight loss. Long: Continue to montior risk factors.        ITP Comments: ITP Comments    Row Name 09/23/18 1224 10/02/18 0940  10/30/18 0601 11/20/18 0948 11/27/18 1210   ITP Comments  Medical review completed today. ITP sent to Dr Loleta Chance for review,changes as needed and signature. Documentation of diagnosis can be found in [facility] 07/11/2018  30 Day Review. Continue with ITP unless directed changes per Medical Director review.  30 Day Review. Continue with ITP unless directed changes per Medical Director review.  Our program is currently closed due to COVID-19.  We are communicating with patient via phone calls and emails.    30 day review. Continue with ITP unless directed changes by Medical Director chart review.      Comments:

## 2019-04-01 DIAGNOSIS — I10 Essential (primary) hypertension: Secondary | ICD-10-CM | POA: Diagnosis not present

## 2019-04-01 DIAGNOSIS — I701 Atherosclerosis of renal artery: Secondary | ICD-10-CM | POA: Diagnosis not present

## 2019-04-01 DIAGNOSIS — N183 Chronic kidney disease, stage 3 (moderate): Secondary | ICD-10-CM | POA: Diagnosis not present

## 2019-04-01 DIAGNOSIS — D631 Anemia in chronic kidney disease: Secondary | ICD-10-CM | POA: Diagnosis not present

## 2019-04-01 DIAGNOSIS — R809 Proteinuria, unspecified: Secondary | ICD-10-CM | POA: Diagnosis not present

## 2019-04-03 ENCOUNTER — Ambulatory Visit: Payer: Medicare Other

## 2019-04-04 ENCOUNTER — Ambulatory Visit (INDEPENDENT_AMBULATORY_CARE_PROVIDER_SITE_OTHER): Payer: Medicare Other

## 2019-04-04 VITALS — Ht 67.0 in | Wt 150.0 lb

## 2019-04-04 DIAGNOSIS — Z0000 Encounter for general adult medical examination without abnormal findings: Secondary | ICD-10-CM | POA: Diagnosis not present

## 2019-04-04 NOTE — Progress Notes (Signed)
PCP notes:  Health Maintenance:  Colonoscopy due.  Abnormal Screenings:  Mini-Cog: 15/22  Patient concerns:  None  Nurse concerns:  None  Next PCP appt.: 04/25/2019 at 12:00

## 2019-04-04 NOTE — Progress Notes (Addendum)
Subjective:   Ryan Pittman is a 78 y.o. male who presents for Medicare Annual/Subsequent preventive examination.  This visit type was conducted due to national recommendations for restrictions regarding the COVID-19 Pandemic (e.g. social distancing). This format is felt to be most appropriate for this patient at this time. All issues noted in this document were discussed and addressed. No physical exam was performed (except for noted visual exam findings with Video Visits). This patient, Ryan Pittman, has given permission to perform this visit via telephone. Vital signs may be absent or patient reported.  Patient location:  At home  Nurse location:  At home     Review of Systems:  n/a Cardiac Risk Factors include: advanced age (>45men, >39 women);male gender;dyslipidemia     Objective:    Vitals: Ht 5\' 7"  (1.702 m) Comment: per patient  Wt 150 lb (68 kg) Comment: per patient  BMI 23.49 kg/m   Body mass index is 23.49 kg/m.  Advanced Directives 04/04/2019 09/23/2018 07/12/2018 07/10/2018 06/24/2018 03/27/2018 03/22/2017  Does Patient Have a Medical Advance Directive? No No No No No No Yes  Does patient want to make changes to medical advance directive? - - - - - - No - Patient declined  Would patient like information on creating a medical advance directive? - No - Patient declined No - Patient declined No - Patient declined No - Patient declined No - Patient declined -    Tobacco Social History   Tobacco Use  Smoking Status Former Smoker  . Types: Cigarettes  . Quit date: 09/04/2008  . Years since quitting: 10.5  Smokeless Tobacco Former Systems developer  . Types: Chew  Tobacco Comment   HAs quit tobacco products 2009     Counseling given: Not Answered Comment: HAs quit tobacco products 2009   Clinical Intake:  Pre-visit preparation completed: Yes  Pain : No/denies pain     Nutritional Status: BMI of 19-24  Normal Nutritional Risks: None Diabetes: No  How often do  you need to have someone help you when you read instructions, pamphlets, or other written materials from your doctor or pharmacy?: 1 - Never What is the last grade level you completed in school?: 12th grade  Interpreter Needed?: No  Information entered by :: NAllen LPN  Past Medical History:  Diagnosis Date  . Anemia    due to GIB, s/p transfusion  . Anginal pain ([place])   . Arthritis    back pain, much worse after consecutive golf rounds  . Cancer ([place])    thyroid  . Chronic kidney disease   . Colon polyps   . Coronary artery disease   . Dyspnea    walking up a hill  . GERD (gastroesophageal reflux disease)   . Hypercalcemia    h/o, resolved as of 2012, prev due to high amount of calcium intake  . Hyperlipidemia   . Hypertension   . IgG gammopathy    stable as of 2012 per [facility]  . Pneumonia    as a child  . PVD (peripheral vascular disease) ([place])    L leg bypass, R leg stented   Past Surgical History:  Procedure Laterality Date  .  dental implant left bottom-did have bleeding     . BYPASS GRAFT     L leg  . COLONOSCOPY WITH PROPOFOL N/A 05/04/2015   Procedure: COLONOSCOPY WITH PROPOFOL;  Surgeon: Lollie Sails, MD;  Location: [facility] ENDOSCOPY;  Service: Endoscopy;  Laterality: N/A;  . CORONARY ARTERY  BYPASS GRAFT N/A 07/11/2018   Procedure: CORONARY ARTERY BYPASS GRAFTING (CABG) x three, using left internal mammary artery and right leg greater saphenous vein harvested endoscopically;  Surgeon: Gaye Pollack, MD;  Location: [facility] OR;  Service: Open Heart Surgery;  Laterality: N/A;  . LEFT HEART CATH AND CORONARY ANGIOGRAPHY N/A 06/24/2018   Procedure: LEFT HEART CATH AND CORONARY ANGIOGRAPHY;  Surgeon: [name], MD;  Location: [facility];  Service: Cardiovascular;  Laterality: N/A;  . TEE WITHOUT CARDIOVERSION N/A 07/11/2018   Procedure: TRANSESOPHAGEAL ECHOCARDIOGRAM (TEE);  Surgeon: Gaye Pollack, MD;  Location: [facility];  Service: Open Heart Surgery;   Laterality: N/A;  . THYROIDECTOMY, PARTIAL  2016   Family History  Problem Relation Age of Onset  . Diabetes Mother   . Stroke Father   . Colon cancer Neg Hx   . Prostate cancer Neg Hx    Social History   Socioeconomic History  . Marital status: Married    Spouse name: Not on file  . Number of children: Not on file  . Years of education: Not on file  . Highest education level: Not on file  Occupational History  . Not on file  Social Needs  . Financial resource strain: Not hard at all  . Food insecurity    Worry: Never true    Inability: Never true  . Transportation needs    Medical: No    Non-medical: No  Tobacco Use  . Smoking status: Former Smoker    Types: Cigarettes    Quit date: 09/04/2008    Years since quitting: 10.5  . Smokeless tobacco: Former Systems developer    Types: Chew  . Tobacco comment: HAs quit tobacco products 2009  Substance and Sexual Activity  . Alcohol use: Not Currently    Alcohol/week: 0.0 standard drinks  . Drug use: No  . Sexual activity: Yes  Lifestyle  . Physical activity    Days per week: 7 days    Minutes per session: 10 min  . Stress: Not at all  Relationships  . Social Herbalist on phone: Not on file    Gets together: Not on file    Attends religious service: Not on file    Active member of club or organization: Not on file    Attends meetings of clubs or organizations: Not on file    Relationship status: Not on file  Other Topics Concern  . Not on file  Social History Narrative   Married 50+ years, 2 kids   [place] to play golf    Outpatient Encounter Medications as of 04/04/2019  Medication Sig  . amiodarone (PACERONE) 200 MG tablet TAKE 1 TABLET BY MOUTH DAILY  . amLODipine (NORVASC) 2.5 MG tablet Take 2.5 mg by mouth daily.  . clopidogrel (PLAVIX) 75 MG tablet TAKE ONE TABLET EVERY DAY  . Co-Enzyme Q-10 100 MG CAPS Take by mouth.  . Evolocumab (REPATHA SURECLICK) 474 MG/ML SOAJ Inject 140 mg into  the skin every 14 (fourteen) days.  [name] levothyroxine (SYNTHROID, LEVOTHROID) 75 MCG tablet Take 75 mcg by mouth daily before breakfast. Takes 1.5 tabs every Thursday per pt  . metoprolol tartrate (LOPRESSOR) 25 MG tablet Take 0.5 tablets (12.5 mg total) by mouth 2 (two) times daily.  . pravastatin (PRAVACHOL) 40 MG tablet Take 1 tablet (40 mg total) by mouth daily.  . tamsulosin (FLOMAX) 0.4 MG CAPS capsule Take 1 capsule (0.4 mg total) by  mouth daily.  . traZODone (DESYREL) 100 MG tablet TAKE 1/2 TO 1 TABLET BY MOUTH AT BEDTIMEAS NEEDED FOR SLEEP  . Omega-3 Fatty Acids (FISH OIL OMEGA-3 PO) Take 1 capsule by mouth daily.   . vitamin C (ASCORBIC ACID) 500 MG tablet Take 500 mg by mouth daily.   No facility-administered encounter medications on file as of 04/04/2019.     Activities of Daily Living In your present state of health, do you have any difficulty performing the following activities: 04/04/2019 07/12/2018  Hearing? N -  [place]? N -  Difficulty concentrating or making decisions? N -  Walking or climbing stairs? N -  Comment - -  Dressing or bathing? N -  Doing errands, shopping? N N  Preparing Food and eating ? N -  Using the Toilet? N -  In the past six months, have you accidently leaked urine? N -  Do you have problems with loss of bowel control? N -  Managing your Medications? N -  Managing your Finances? N -  Housekeeping or managing your Housekeeping? N -  Some recent data might be hidden    Patient Care Team: Tonia Ghent, MD as PCP - General (Family Medicine) Lorelee Cover., MD as Consulting Physician (Ophthalmology) Lucky Cowboy, Erskine Squibb, MD as Referring Physician (Vascular Surgery) Dagoberto Ligas, MD as Referring Physician (Internal Medicine) Ronda Fairly. [place], MD as Consulting Physician (Urology) Macario Carls, [place], CCC-A as Consulting Physician (Audiology) Lanier Clam, MD (Endocrinology)   Assessment:   This is a routine wellness examination for  [place].  Exercise Activities and Dietary recommendations Current Exercise Habits: Home exercise routine, Type of exercise: stretching;strength training/weights, Time (Minutes): 15, Frequency (Times/Week): 7, Weekly Exercise (Minutes/Week): 105  Goals    . Increase physical activity (pt-stated)     Starting 03/27/2018, I will continue to golf for at least 4 hrs 1-2 days per week and limit intake of saturated fat.     . Patient Stated     04/04/2019, no goals       Fall Risk Fall Risk  04/04/2019 03/27/2018 03/30/2017 03/22/2017 12/24/2015  Falls in the past year? 0 No No No No  Comment - - Emmi Telephone Survey: data to providers prior to load - -  Risk for fall due to : Medication side effect - - - -  Follow up Falls evaluation completed;Falls prevention discussed - - - -   Is the patient's home free of loose throw rugs in walkways, pet beds, electrical cords, etc?   yes      Grab bars in the bathroom? yes      Handrails on the stairs?   yes      Adequate lighting?   yes  Timed Get Up and Go Performed: n/a  Depression Screen PHQ 2/9 Scores 04/04/2019 09/23/2018 03/27/2018 03/22/2017  PHQ - 2 Score 0 0 0 0  PHQ- 9 Score 0 1 0 -    Cognitive Function MMSE - Mini Mental State Exam 04/04/2019 03/27/2018 03/22/2017 12/24/2015  Orientation to time 4 5 5 5   Orientation to Place 5 5 5 5   Registration 3 3 3 3   Attention/ Calculation 0 0 0 0  Recall 3 3 3 3   Language- name 2 objects 0 0 0 0  Language- repeat 0 1 1 1   Language- follow 3 step command 0 3 3 3   Language- read & follow direction 0 0 0 0  Write a sentence 0 0 0 0  Copy design 0 0 0 0  Total score 15 20 20 20    Mini Cog  Mini-Cog screen was completed. Maximum score is 22. A value of 0 denotes this part of the MMSE was not completed or the patient failed this part of the Mini-Cog screening.       Immunization History  Administered Date(s) Administered  . Influenza, High Dose Seasonal PF 05/19/2016  . Influenza-Unspecified  06/18/2014, 07/02/2017, 06/12/2018  . Pneumococcal Conjugate-13 04/07/2014  . Pneumococcal Polysaccharide-23 04/28/2010  . Td 03/12/2012  . Zoster 06/07/2010    Qualifies for Shingles Vaccine? yes  Screening Tests Health Maintenance  Topic Date Due  . COLONOSCOPY  05/03/2018  . INFLUENZA VACCINE  04/05/2019  . TETANUS/TDAP  03/12/2022  . PNA vac Low Risk Adult  Completed   Cancer Screenings: Lung: Low Dose CT Chest recommended if Age 52-80 years, 30 pack-year currently smoking OR have quit w/in 15years. Patient does not qualify. Colorectal: due  Additional Screenings:  Hepatitis C Screening:n/a      Plan:   Patient has no goals set for this year.  I have personally reviewed and noted the following in the patient's chart:   . Medical and social history . Use of alcohol, tobacco or illicit drugs  . Current medications and supplements . Functional ability and status . Nutritional status . Physical activity . Advanced directives . List of other physicians . Hospitalizations, surgeries, and ER visits in previous 12 months . Vitals . Screenings to include cognitive, depression, and falls . Referrals and appointments  In addition, I have reviewed and discussed with patient certain preventive protocols, quality metrics, and best practice recommendations. A written personalized care plan for preventive services as well as general preventive health recommendations were provided to patient.     Kellie Simmering, LPN  [id number]/76/[id number]

## 2019-04-04 NOTE — Patient Instructions (Signed)
[name] , Thank you for taking time to come for your Medicare Wellness Visit. I appreciate your ongoing commitment to your health goals. Please review the following plan we discussed and let me know if I can assist you in the future.   Screening recommendations/referrals: Colonoscopy: due Recommended yearly ophthalmology/optometry visit for glaucoma screening and checkup Recommended yearly dental visit for hygiene and checkup  Vaccinations: Influenza vaccine: 06/2018 Pneumococcal vaccine: 04/2014 Tdap vaccine: 03/2012 Shingles vaccine: discussed    Advanced directives: Advance directive discussed with you today.   Conditions/risks identified: HTN  Next appointment: 04/25/2019 at 12:00  Preventive Care 80 Years and Older, Male Preventive care refers to lifestyle choices and visits with your health care provider that can promote health and wellness. What does preventive care include?  A yearly physical exam. This is also called an annual well check.  Dental exams once or twice a year.  Routine eye exams. Ask your health care provider how often you should have your eyes checked.  Personal lifestyle choices, including:  Daily care of your teeth and gums.  Regular physical activity.  Eating a healthy diet.  Avoiding tobacco and drug use.  Limiting alcohol use.  Practicing safe sex.  Taking low doses of aspirin every day.  Taking vitamin and mineral supplements as recommended by your health care provider. What happens during an annual well check? The services and screenings done by your health care provider during your annual well check will depend on your age, overall health, lifestyle risk factors, and family history of disease. Counseling  Your health care provider may ask you questions about your:  Alcohol use.  Tobacco use.  Drug use.  Emotional well-being.  Home and relationship well-being.  Sexual activity.  Eating habits.  History of falls.   Memory and ability to understand (cognition).  Work and work Statistician. Screening  You may have the following tests or measurements:  Height, weight, and BMI.  Blood pressure.  Lipid and cholesterol levels. These may be checked every 5 years, or more frequently if you are over 60 years old.  Skin check.  Lung cancer screening. You may have this screening every year starting at age 42 if you have a 30-pack-year history of smoking and currently smoke or have quit within the past 15 years.  Fecal occult blood test (FOBT) of the stool. You may have this test every year starting at age 3.  Flexible sigmoidoscopy or colonoscopy. You may have a sigmoidoscopy every 5 years or a colonoscopy every 10 years starting at age 21.  Prostate cancer screening. Recommendations will vary depending on your family history and other risks.  Hepatitis C blood test.  Hepatitis B blood test.  Sexually transmitted disease (STD) testing.  Diabetes screening. This is done by checking your blood sugar (glucose) after you have not eaten for a while (fasting). You may have this done every 1-3 years.  Abdominal aortic aneurysm (AAA) screening. You may need this if you are a current or former smoker.  Osteoporosis. You may be screened starting at age 56 if you are at high risk. Talk with your health care provider about your test results, treatment options, and if necessary, the need for more tests. Vaccines  Your health care provider may recommend certain vaccines, such as:  Influenza vaccine. This is recommended every year.  Tetanus, diphtheria, and acellular pertussis (Tdap, Td) vaccine. You may need a Td booster every 10 years.  Zoster vaccine. You may need this after age 78.  Pneumococcal 13-valent conjugate (PCV13) vaccine. One dose is recommended after age 21.  Pneumococcal polysaccharide (PPSV23) vaccine. One dose is recommended after age 71. Talk to your health care provider about which  screenings and vaccines you need and how often you need them. This information is not intended to replace advice given to you by your health care provider. Make sure you discuss any questions you have with your health care provider. Document Released: 09/17/2015 Document Revised: 05/10/2016 Document Reviewed: 06/22/2015 Elsevier Interactive Patient Education  2017 [place] Prevention in the Home Falls can cause injuries. They can happen to people of all ages. There are many things you can do to make your home safe and to help prevent falls. What can I do on the outside of my home?  Regularly fix the edges of walkways and driveways and fix any cracks.  Remove anything that might make you trip as you walk through a door, such as a raised step or threshold.  Trim any bushes or trees on the path to your home.  Use bright outdoor lighting.  Clear any walking paths of anything that might make someone trip, such as rocks or tools.  Regularly check to see if handrails are loose or broken. Make sure that both sides of any steps have handrails.  Any raised decks and porches should have guardrails on the edges.  Have any leaves, snow, or ice cleared regularly.  Use sand or salt on walking paths during winter.  Clean up any spills in your garage right away. This includes oil or grease spills. What can I do in the bathroom?  Use night lights.  Install grab bars by the toilet and in the tub and shower. Do not use towel bars as grab bars.  Use non-skid mats or decals in the tub or shower.  If you need to sit down in the shower, use a plastic, non-slip stool.  Keep the floor dry. Clean up any water that spills on the floor as soon as it happens.  Remove soap buildup in the tub or shower regularly.  Attach bath mats securely with double-sided non-slip rug tape.  Do not have throw rugs and other things on the floor that can make you trip. What can I do in the bedroom?  Use  night lights.  Make sure that you have a light by your bed that is easy to reach.  Do not use any sheets or blankets that are too big for your bed. They should not hang down onto the floor.  Have a firm chair that has side arms. You can use this for support while you get dressed.  Do not have throw rugs and other things on the floor that can make you trip. What can I do in the kitchen?  Clean up any spills right away.  Avoid walking on wet floors.  Keep items that you use a lot in easy-to-reach places.  If you need to reach something above you, use a strong step stool that has a grab bar.  Keep electrical cords out of the way.  Do not use floor polish or wax that makes floors slippery. If you must use wax, use non-skid floor wax.  Do not have throw rugs and other things on the floor that can make you trip. What can I do with my stairs?  Do not leave any items on the stairs.  Make sure that there are handrails on both sides of the stairs and use them.  Fix handrails that are broken or loose. Make sure that handrails are as long as the stairways.  Check any carpeting to make sure that it is firmly attached to the stairs. Fix any carpet that is loose or worn.  Avoid having throw rugs at the top or bottom of the stairs. If you do have throw rugs, attach them to the floor with carpet tape.  Make sure that you have a light switch at the top of the stairs and the bottom of the stairs. If you do not have them, ask someone to add them for you. What else can I do to help prevent falls?  Wear shoes that:  Do not have high heels.  Have rubber bottoms.  Are comfortable and fit you well.  Are closed at the toe. Do not wear sandals.  If you use a stepladder:  Make sure that it is fully opened. Do not climb a closed stepladder.  Make sure that both sides of the stepladder are locked into place.  Ask someone to hold it for you, if possible.  Clearly mark and make sure that you  can see:  Any grab bars or handrails.  First and last steps.  Where the edge of each step is.  Use tools that help you move around (mobility aids) if they are needed. These include:  Canes.  Walkers.  Scooters.  Crutches.  Turn on the lights when you go into a dark area. Replace any light bulbs as soon as they burn out.  Set up your furniture so you have a clear path. Avoid moving your furniture around.  If any of your floors are uneven, fix them.  If there are any pets around you, be aware of where they are.  Review your medicines with your doctor. Some medicines can make you feel dizzy. This can increase your chance of falling. Ask your doctor what other things that you can do to help prevent falls. This information is not intended to replace advice given to you by your health care provider. Make sure you discuss any questions you have with your health care provider. Document Released: 06/17/2009 Document Revised: 01/27/2016 Document Reviewed: 09/25/2014 Elsevier Interactive Patient Education  2017 [employer].

## 2019-04-07 ENCOUNTER — Encounter: Payer: Medicare Other | Admitting: Family Medicine

## 2019-04-17 ENCOUNTER — Other Ambulatory Visit: Payer: Self-pay | Admitting: Family Medicine

## 2019-04-17 ENCOUNTER — Other Ambulatory Visit (INDEPENDENT_AMBULATORY_CARE_PROVIDER_SITE_OTHER): Payer: Medicare Other

## 2019-04-17 DIAGNOSIS — I10 Essential (primary) hypertension: Secondary | ICD-10-CM | POA: Diagnosis not present

## 2019-04-17 LAB — LIPID PANEL
Cholesterol: 218 mg/dL — ABNORMAL HIGH (ref 0–200)
HDL: 64 mg/dL (ref >39.00)
LDL Cholesterol: 118 mg/dL — ABNORMAL HIGH (ref 0–99)
NonHDL: 154.02
Total CHOL/HDL Ratio: 3
Triglycerides: 181 mg/dL — ABNORMAL HIGH (ref 0.0–149.0)
VLDL: 36.2 mg/dL (ref 0.0–40.0)

## 2019-04-17 LAB — COMPREHENSIVE METABOLIC PANEL
ALT: 13 U/L (ref 0–53)
AST: 15 U/L (ref 0–37)
Albumin: 4 g/dL (ref 3.5–5.2)
Alkaline Phosphatase: 38 U/L — ABNORMAL LOW (ref 39–117)
BUN: 34 mg/dL — ABNORMAL HIGH (ref 6–23)
CO2: 23 mEq/L (ref 19–32)
Calcium: 8.9 mg/dL (ref 8.4–10.5)
Chloride: 108 mEq/L (ref 96–112)
Creatinine, Ser: 1.3 mg/dL (ref 0.40–1.50)
GFR: 53.1 mL/min — ABNORMAL LOW (ref >60.00)
Glucose, Bld: 96 mg/dL (ref 70–99)
Potassium: 4.4 mEq/L (ref 3.5–5.1)
Sodium: 140 mEq/L (ref 135–145)
Total Bilirubin: 0.4 mg/dL (ref 0.2–1.2)
Total Protein: 6.2 g/dL (ref 6.0–8.3)

## 2019-04-17 LAB — TSH: TSH: 4.81 u[IU]/mL — ABNORMAL HIGH (ref 0.35–4.50)

## 2019-04-18 ENCOUNTER — Other Ambulatory Visit: Payer: Medicare Other

## 2019-04-25 ENCOUNTER — Encounter: Payer: Self-pay | Admitting: Family Medicine

## 2019-04-25 ENCOUNTER — Ambulatory Visit (INDEPENDENT_AMBULATORY_CARE_PROVIDER_SITE_OTHER): Payer: Medicare Other | Admitting: Family Medicine

## 2019-04-25 ENCOUNTER — Other Ambulatory Visit: Payer: Self-pay

## 2019-04-25 ENCOUNTER — Other Ambulatory Visit: Payer: Self-pay | Admitting: Family Medicine

## 2019-04-25 VITALS — BP 134/58 | HR 50 | Temp 97.9°F | Ht 67.0 in | Wt 152.5 lb

## 2019-04-25 DIAGNOSIS — I701 Atherosclerosis of renal artery: Secondary | ICD-10-CM

## 2019-04-25 DIAGNOSIS — E039 Hypothyroidism, unspecified: Secondary | ICD-10-CM | POA: Diagnosis not present

## 2019-04-25 DIAGNOSIS — D472 Monoclonal gammopathy: Secondary | ICD-10-CM

## 2019-04-25 DIAGNOSIS — G4700 Insomnia, unspecified: Secondary | ICD-10-CM

## 2019-04-25 DIAGNOSIS — Z7189 Other specified counseling: Secondary | ICD-10-CM

## 2019-04-25 DIAGNOSIS — E7800 Pure hypercholesterolemia, unspecified: Secondary | ICD-10-CM | POA: Diagnosis not present

## 2019-04-25 DIAGNOSIS — I739 Peripheral vascular disease, unspecified: Secondary | ICD-10-CM

## 2019-04-25 DIAGNOSIS — Z862 Personal history of diseases of the blood and blood-forming organs and certain disorders involving the immune mechanism: Secondary | ICD-10-CM | POA: Diagnosis not present

## 2019-04-25 DIAGNOSIS — Z0000 Encounter for general adult medical examination without abnormal findings: Secondary | ICD-10-CM

## 2019-04-25 DIAGNOSIS — I10 Essential (primary) hypertension: Secondary | ICD-10-CM

## 2019-04-25 DIAGNOSIS — G2581 Restless legs syndrome: Secondary | ICD-10-CM

## 2019-04-25 LAB — CBC WITH DIFFERENTIAL/PLATELET
Basophils Absolute: 0.1 10*3/uL (ref 0.0–0.1)
Basophils Relative: 0.6 % (ref 0.0–3.0)
Eosinophils Absolute: 0.1 10*3/uL (ref 0.0–0.7)
Eosinophils Relative: 0.8 % (ref 0.0–5.0)
HCT: 34.3 % — ABNORMAL LOW (ref 39.0–52.0)
Hemoglobin: 11.4 g/dL — ABNORMAL LOW (ref 13.0–17.0)
Lymphocytes Relative: 11.3 % — ABNORMAL LOW (ref 12.0–46.0)
Lymphs Abs: 1.4 10*3/uL (ref 0.7–4.0)
MCHC: 33.4 g/dL (ref 30.0–36.0)
MCV: 95.3 fl (ref 78.0–100.0)
Monocytes Absolute: 1 10*3/uL (ref 0.1–1.0)
Monocytes Relative: 7.9 % (ref 3.0–12.0)
Neutro Abs: 10.1 10*3/uL — ABNORMAL HIGH (ref 1.4–7.7)
Neutrophils Relative %: 79.4 % — ABNORMAL HIGH (ref 43.0–77.0)
Platelets: 219 10*3/uL (ref 150.0–400.0)
RBC: 3.6 Mil/uL — ABNORMAL LOW (ref 4.22–5.81)
RDW: 14.9 % (ref 11.5–15.5)
WBC: 12.6 10*3/uL — ABNORMAL HIGH (ref 4.0–10.5)

## 2019-04-25 LAB — IRON: Iron: 80 ug/dL (ref 42–165)

## 2019-04-25 MED ORDER — AMLODIPINE BESYLATE 2.5 MG PO TABS: ORAL_TABLET | ORAL | Status: DC

## 2019-04-25 NOTE — Patient Instructions (Addendum)
Check with your insurance to see if they will cover the shingrix shot. I would get a flu shot each fall.    Stop amlodipine for 1 week and see if you are less lightheaded and if you feel better.  Let me know how it goes.   Your thyroid test was nearly normal and it doesn't make sense to change your med at this point.  Recheck TSH in 6 months.   Go to the lab on the way out.  We'll contact you with your lab report. Take care.  Glad to see you.

## 2019-04-25 NOTE — Progress Notes (Signed)
He has some feeling of B leg weakness with prolonged walking.  He is still getting 5-6K steps a day at baseline.  He has some R calf pain with walking.   He is seen Dr. Gwenlyn Found about this previously but he was unsure if he needed to see Dr. Lucky Cowboy.  I told him I will check on this.  Recall recheck today.  Can do math.  3/3 attention, 3/3 recall.  He is still working his store and still managing all of that.    Elevated Cholesterol: Using medications without problems: yes Muscle aches:  See above, likely not from statin.  Diet compliance: yes Exercise:yes Recently started on repatha.   Still on pravastatin.   He cut back on fatty meats.    Hypothyroidism.   Still on replacement.  No neck mass.  No dysphagia.  Labs d/w pt.   Hypertension:    Using medication without problems or lightheadedness: occ lightheaded, d/w pt about options.   Chest pain with exertion: no Edema:no Short of breath:no  Insomnia.  Has used trazodone in the meantime but he was still having trouble sleeping.  He has the feeling of jumping in his legs, ie a restless feeling in his legs.  This only happens at night but has been worse in the last 2-3 years.    PSA and colon screening not due now given age.   Advance directive encouraged. Wife designated if patient were incapacitated.   He'll get a flu shot at the pharmacy.   Shingles d/w pt.   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  I'll defer.  He agrees.  Meds, vitals, and allergies reviewed.   PMH and SH reviewed  ROS: Per HPI unless specifically indicated in ROS section   GEN: nad, alert and oriented HEENT: mucous membranes moist NECK: supple w/o LA CV: rrr. PULM: ctab, no inc wob ABD: soft, +bs EXT: no edema SKIN: no acute rash Intact and palpable but decreased dorsalis pedis pulse in the right foot, compared to radial pulse.

## 2019-04-27 ENCOUNTER — Telehealth: Payer: Self-pay | Admitting: Family Medicine

## 2019-04-27 NOTE — Assessment & Plan Note (Signed)
PSA and colon screening not due now given age.   Advance directive encouraged. Wife designated if patient were incapacitated.   He'll get a flu shot at the pharmacy.   Shingles d/w pt.

## 2019-04-27 NOTE — Assessment & Plan Note (Signed)
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  I'll defer.  He agrees.

## 2019-04-27 NOTE — Assessment & Plan Note (Addendum)
We need to check his labs before we proceed with treatment.  Rationale discussed with patient.  Restless leg syndrome discussed with patient in general.  See notes on labs.  >25 minutes spent in face to face time with patient, >50% spent in counselling or coordination of care.

## 2019-04-27 NOTE — Assessment & Plan Note (Signed)
Advance directive encouraged. Wife designated if patient were incapacitated.

## 2019-04-27 NOTE — Assessment & Plan Note (Signed)
He has some feeling of B leg weakness with prolonged walking.  He is still getting 5-6K steps a day at baseline.  He has some R calf pain with walking.   He is seen Dr. Gwenlyn Found about this previously but he was unsure if he needed to see Dr. Lucky Cowboy.  I told him I will check on this.  See follow-up notes.

## 2019-04-27 NOTE — Telephone Encounter (Signed)
I saw the patient recently but it was unclear if you wanted him to see Dr. Lucky Cowboy.  I need your input.  Thanks.

## 2019-04-27 NOTE — Assessment & Plan Note (Signed)
See restless leg syndrome discussion.

## 2019-04-27 NOTE — Assessment & Plan Note (Signed)
Still on replacement.  No neck mass.  No dysphagia.  Labs d/w pt.

## 2019-04-27 NOTE — Assessment & Plan Note (Signed)
No change in meds at this point.  He agrees with plan.

## 2019-04-27 NOTE — Assessment & Plan Note (Signed)
Recently started on repatha.   Still on pravastatin.   He cut back on fatty meats.   He has not yet had time for medications to have effect.  Labs discussed with patient.

## 2019-04-28 NOTE — Telephone Encounter (Signed)
Ryan Pittman, thanks for reaching out regarding [name].  I can certainly continue to follow him noninvasively and clinically regarding his PAD and should he get to the point where he needs intervention, since he is previously had bypass grafting I can refer him back to the vascular surgeon.

## 2019-04-29 NOTE — Telephone Encounter (Signed)
Left detailed message on voicemail. DPR 

## 2019-04-29 NOTE — Telephone Encounter (Signed)
See below.  I appreciate input from Dr. Gwenlyn Found.  Please notify patient.  Thanks.

## 2019-05-01 ENCOUNTER — Other Ambulatory Visit: Payer: Self-pay | Admitting: Family Medicine

## 2019-05-01 MED ORDER — ROPINIROLE HCL 0.25 MG PO TABS: 0.2500 mg | ORAL_TABLET | Freq: Every day | ORAL | 1 refills | Status: DC

## 2019-05-06 DIAGNOSIS — D692 Other nonthrombocytopenic purpura: Secondary | ICD-10-CM | POA: Diagnosis not present

## 2019-05-14 ENCOUNTER — Encounter: Payer: Self-pay | Admitting: *Deleted

## 2019-05-14 DIAGNOSIS — Z951 Presence of aortocoronary bypass graft: Secondary | ICD-10-CM

## 2019-05-14 NOTE — Progress Notes (Signed)
Discharge Progress Report  Patient Details  Name: Ryan Pittman MRN: [id number] Date of Birth: November 20, 1940 Referring Provider:     Cardiac Rehab from 09/23/2018 in [facility]  Referring Provider  Ryan Burow MD       Number of Visits: 23  Reason for Discharge:  Early Exit:  Lack of attendance  Smoking History:  Social History   Tobacco Use  Smoking Status Former Smoker  . Types: Cigarettes  . Quit date: 09/04/2008  . Years since quitting: 10.6  Smokeless Tobacco Former Systems developer  . Types: Chew  Tobacco Comment   HAs quit tobacco products 2009    Diagnosis:  S/P CABG x 3  ADL UCSD:   Initial Exercise Prescription:   Discharge Exercise Prescription (Final Exercise Prescription Changes): Exercise Prescription Changes - 11/20/18 0900      Response to Exercise   Blood Pressure (Admit)  126/64    Blood Pressure (Exercise)  136/70    Blood Pressure (Exit)  104/60    Heart Rate (Admit)  58 bpm    Heart Rate (Exercise)  78 bpm    Heart Rate (Exit)  53 bpm    Rating of Perceived Exertion (Exercise)  14    Symptoms  none    Duration  Continue with 30 min of aerobic exercise without signs/symptoms of physical distress.    Intensity  THRR unchanged      Progression   Progression  Continue to progress workloads to maintain intensity without signs/symptoms of physical distress.    Average METs  2.64      Resistance Training   Training Prescription  Yes    Weight  5 lbs    Reps  10-15      Interval Training   Interval Training  No      Treadmill   MPH  2    Grade  0.5    Minutes  15    METs  2.67      Bike   Level  1    Watts  32    Minutes  15    METs  3.64      T5 Nustep   Level  1    Minutes  15    METs  1.9      Home Exercise Plan   Plans to continue exercise at  Home (comment)   walking   Frequency  Add 3 additional days to program exercise sessions.    Initial Home Exercises Provided  10/24/18       Functional  Capacity:   Psychological, QOL, Others - Outcomes: PHQ 2/9: Depression screen [facility] 2/9 04/25/2019 04/04/2019 09/23/2018 03/27/2018 03/22/2017  Decreased Interest 0 0 0 0 0  Down, Depressed, Hopeless 0 0 0 0 0  PHQ - 2 Score 0 0 0 0 0  Altered sleeping - 0 0 0 -  Tired, decreased energy - 0 1 0 -  Change in appetite - 0 0 0 -  Feeling bad or failure about yourself  - 0 0 0 -  Trouble concentrating - 0 0 0 -  Moving slowly or fidgety/restless - 0 0 0 -  Suicidal thoughts - 0 0 0 -  PHQ-9 Score - 0 1 0 -  Difficult doing work/chores - - Somewhat difficult Not difficult at all -    Quality of Life:   Personal Goals: Goals established at orientation with interventions provided to work toward goal.    Personal Goals Discharge: Goals  and Risk Factor Review    Row Name 12/05/18 [id number] 12/25/18 1326 01/16/19 1155         Core Components/Risk Factors/Patient Goals Review   Personal Goals Review  Hypertension;Weight Management/Obesity  Hypertension;Weight Management/Obesity  Hypertension;Weight Management/Obesity     Review  Ryan Pittman has been doing well at home.  His weight continues to stay pretty steady.  He continues to check his blood pressures daily and has been getting good numbers.   Ryan Pittman is doing well.  He is still maintaining his weight.  He continues to check his pressures and they have been between 120-130s/50-60s.    Ryan Pittman continues to maintain and says his numbers are still looking good!     Expected Outcomes  Short: Continue to keep weight down.  Long: Continue to monitor risk factors.   Short: Continue to work on weight loss. Long: Continue to montior risk factors.   Short: Continue to work on weight loss. Long: Continue to montior risk factors.         Exercise Goals and Review:   Exercise Goals Re-Evaluation: Exercise Goals Re-Evaluation    Row Name 11/20/18 0948 12/05/18 0923 12/25/18 1322 01/16/19 1154 02/07/19 1426     Exercise Goal Re-Evaluation   Exercise Goals  Review  Increase Physical Activity;Increase Strength and Stamina;Understanding of Exercise Prescription  Increase Physical Activity;Increase Strength and Stamina;Understanding of Exercise Prescription  Increase Physical Activity;Increase Strength and Stamina;Understanding of Exercise Prescription  Increase Physical Activity;Increase Strength and Stamina;Understanding of Exercise Prescription  Improve claudication pain tolerance and improve walking ability   Comments  Ryan Pittman has been doing well in rehab.  He is getting 32 watts on the bike.  He will be walking and using videos at home.  We will continue to monitor his progress.   Ryan Pittman has been doing well at home.  He is getting in some walking, but not as much as he should.  He also tries to do his stairs a few times a day to keep his legs strong.    Ryan Pittman continues to do well at home.  He is getting in his exercise and walking some.  He knows he should do more, but is happy where he is.   Ryan Pittman continues to walk some.  He was headed to beach when we talked and usually walks more when he is down there.   Ryan Pittman has been staying busy with work.  It helps him to stay active by going up and down the stairs.  He has not been doing much actual walking but will try.    Expected Outcomes  Short: Continue to walk at home.  Long: Continue to build strength and stamina.   Short: Continue to walk at home and try to go more often.  Long: Continue to rebuild stamina.   Short: Continue to try to get in a little more exercise. Long: Continue to rebuild activity levles.   Short: Try to walk more.  Long: Continue to increase activity.   Short: Get back into walking.  Long: Continue to be active.       Nutrition & Weight - Outcomes:    Nutrition: Nutrition Therapy & Goals - 11/20/18 1209      Nutrition Therapy   Diet  Heart Healthy Low Sodium 1400-1600kcal (MSJ 70kg, 170cm, 77yo) BMI:24, waist: 37.5 inches (<40)    Protein (specify units)  55-60 grams    Fiber   30 grams   30-38g   Whole Grain Foods  3 servings  pt eats packet of oatmeal each morning and tries to eat whole grain bread   Saturated Fats  12 max. grams   <12-16g (<7% of kcal)   Fruits and Vegetables  5 servings/day   patient tries to eat things like lima beans, black eyed peas, turnip greens, bananas, blueberries   Sodium  1.5 grams   1500mg      Personal Nutrition Goals   Nutrition Goal  ST: Continue to make healthy changes such as incorporating F/V, whole grains, lean meats, etc. LT: lower cholesterol (patient reports Dr. told him his cholesterol was getting high and he needs to work on it (11/19/18) pt reports that he is now on more medication to lower cholesterol)    Comments  Patient reports eating more grilled chicken and salmon and less pork and country ham (which he loves). Pt reports that he does not want to lose weight (his weight is WNL). Pt reports coninuing with exercise at home. Discussed MyPlate with patient and his kcal and protein goals; pt seems to be doing well with goals set by team.       Intervention Plan   Intervention  Prescribe, educate and counsel regarding individualized specific dietary modifications aiming towards targeted core components such as weight, hypertension, lipid management, diabetes, heart failure and other comorbidities.    Expected Outcomes  Short Term Goal: A plan has been developed with personal nutrition goals set during dietitian appointment.;Long Term Goal: Adherence to prescribed nutrition plan.       Nutrition Discharge:   Education Questionnaire Score:   Goals reviewed with patient; copy given to patient.

## 2019-05-14 NOTE — Progress Notes (Signed)
Cardiac Individual Treatment Plan  Patient Details  Name: Ryan Pittman MRN: [id number] Date of Birth: Sep 12, 1940 Referring Provider:     Cardiac Rehab from 09/23/2018 in [facility] Cardiac and Pulmonary Rehab  Referring Provider  [name] MD      Initial Encounter Date:    Cardiac Rehab from 09/23/2018 in [facility] Cardiac and Pulmonary Rehab  Date  09/23/18      Visit Diagnosis: S/P CABG x 3  Patient's Home Medications on Admission:  Current Outpatient Medications:  .  amiodarone (PACERONE) 200 MG tablet, TAKE 1 TABLET BY MOUTH DAILY, Disp: 90 tablet, Rfl: 3 .  amLODipine (NORVASC) 2.5 MG tablet, TAKE ONE TABLET BY MOUTH EVERY DAY, Disp: 90 tablet, Rfl: 1 .  clopidogrel (PLAVIX) 75 MG tablet, TAKE ONE TABLET EVERY DAY, Disp: 90 tablet, Rfl: 3 .  Co-Enzyme Q-10 100 MG CAPS, Take by mouth., Disp: , Rfl:  .  Evolocumab (REPATHA SURECLICK) 537 MG/ML SOAJ, Inject 140 mg into the skin every 14 (fourteen) days., Disp: 2 pen, Rfl: 12 .  levothyroxine (SYNTHROID, LEVOTHROID) 75 MCG tablet, Take 75 mcg by mouth daily before breakfast. Takes 1.5 tabs every Thursday per pt, Disp: , Rfl:  .  metoprolol tartrate (LOPRESSOR) 25 MG tablet, Take 0.5 tablets (12.5 mg total) by mouth 2 (two) times daily., Disp: 30 tablet, Rfl: 10 .  Omega-3 Fatty Acids (FISH OIL OMEGA-3 PO), Take 1 capsule by mouth daily. , Disp: , Rfl:  .  pravastatin (PRAVACHOL) 40 MG tablet, Take 1 tablet (40 mg total) by mouth daily., Disp: 90 tablet, Rfl: 3 .  rOPINIRole (REQUIP) 0.25 MG tablet, Take 1-2 tablets (0.25-0.5 mg total) by mouth at bedtime., Disp: 60 tablet, Rfl: 1 .  tamsulosin (FLOMAX) 0.4 MG CAPS capsule, Take 1 capsule (0.4 mg total) by mouth daily., Disp: 30 capsule, Rfl: 11 .  vitamin C (ASCORBIC ACID) 500 MG tablet, Take 500 mg by mouth daily., Disp: , Rfl:   Past Medical History: Past Medical History:  Diagnosis Date  . Anemia    due to GIB, s/p transfusion  . Anginal pain ([place])   . Arthritis    back  pain, much worse after consecutive golf rounds  . Cancer ([place])    thyroid  . Chronic kidney disease   . Colon polyps   . Coronary artery disease   . Dyspnea    walking up a hill  . GERD (gastroesophageal reflux disease)   . Hypercalcemia    h/o, resolved as of 2012, prev due to high amount of calcium intake  . Hyperlipidemia   . Hypertension   . IgG gammopathy    stable as of 2012 per [facility]  . Pneumonia    as a child  . PVD (peripheral vascular disease) ([place])    L leg bypass, R leg stented    Tobacco Use: Social History   Tobacco Use  Smoking Status Former Smoker  . Types: Cigarettes  . Quit date: 09/04/2008  . Years since quitting: 10.6  Smokeless Tobacco Former Systems developer  . Types: Chew  Tobacco Comment   HAs quit tobacco products 2009    Labs: Recent Review Flowsheet Data    Labs for ITP Cardiac and Pulmonary Rehab Latest Ref Rng & Units 07/11/2018 07/12/2018 07/14/2018 10/23/2018 04/17/2019   Cholestrol 0 - 200 mg/dL - - - 215(H) 218(H)   LDLCALC 0 - 99 mg/dL - - - 126(H) 118(H)   LDLDIRECT mg/dL - - - - -   HDL >39.00 mg/dL - - -  71 64.00   Trlycerides 0.0 - 149.0 mg/dL - - - 91 181.0(H)   Hemoglobin A1c 4.8 - 5.6 % - - - - -   PHART 7.350 - 7.450 - - - - -   PCO2ART 32.0 - 48.0 mmHg - - - - -   HCO3 20.0 - 28.0 mmol/L - - - - -   TCO2 22 - 32 mmol/L 19(L) 25 - - -   ACIDBASEDEF 0.0 - 2.0 mmol/L - - - - -   O2SAT % - - 56.3 - -       Exercise Target Goals: Exercise Program Goal: Individual exercise prescription set using results from initial 6 min walk test and THRR while considering  patient's activity barriers and safety.   Exercise Prescription Goal: Initial exercise prescription builds to 30-45 minutes a day of aerobic activity, 2-3 days per week.  Home exercise guidelines will be given to patient during program as part of exercise prescription that the participant will acknowledge.  Activity Barriers & Risk Stratification:   6 Minute Walk:   Oxygen  Initial Assessment:   Oxygen Re-Evaluation:   Oxygen Discharge (Final Oxygen Re-Evaluation):   Initial Exercise Prescription:   Perform Capillary Blood Glucose checks as needed.  Exercise Prescription Changes: Exercise Prescription Changes    Row Name 11/20/18 0900             Response to Exercise   Blood Pressure (Admit)  126/64       Blood Pressure (Exercise)  136/70       Blood Pressure (Exit)  104/60       Heart Rate (Admit)  58 bpm       Heart Rate (Exercise)  78 bpm       Heart Rate (Exit)  53 bpm       Rating of Perceived Exertion (Exercise)  14       Symptoms  none       Duration  Continue with 30 min of aerobic exercise without signs/symptoms of physical distress.       Intensity  THRR unchanged         Progression   Progression  Continue to progress workloads to maintain intensity without signs/symptoms of physical distress.       Average METs  2.64         Resistance Training   Training Prescription  Yes       Weight  5 lbs       Reps  10-15         Interval Training   Interval Training  No         Treadmill   MPH  2       Grade  0.5       Minutes  15       METs  2.67         Bike   Level  1       Watts  32       Minutes  15       METs  3.64         T5 Nustep   Level  1       Minutes  15       METs  1.9         Home Exercise Plan   Plans to continue exercise at  Home (comment) walking       Frequency  Add 3 additional days to program exercise sessions.  Initial Home Exercises Provided  10/24/18          Exercise Comments:   Exercise Goals and Review:   Exercise Goals Re-Evaluation : Exercise Goals Re-Evaluation    Row Name 11/20/18 9326 12/05/18 0923 12/25/18 1322 01/16/19 1154 02/07/19 1426     Exercise Goal Re-Evaluation   Exercise Goals Review  Increase Physical Activity;Increase Strength and Stamina;Understanding of Exercise Prescription  Increase Physical Activity;Increase Strength and Stamina;Understanding of  Exercise Prescription  Increase Physical Activity;Increase Strength and Stamina;Understanding of Exercise Prescription  Increase Physical Activity;Increase Strength and Stamina;Understanding of Exercise Prescription  Improve claudication pain tolerance and improve walking ability   Comments  Ryan Pittman has been doing well in rehab.  He is getting 32 watts on the bike.  He will be walking and using videos at home.  We will continue to monitor his progress.   Ryan Pittman has been doing well at home.  He is getting in some walking, but not as much as he should.  He also tries to do his stairs a few times a day to keep his legs strong.    Ryan Pittman continues to do well at home.  He is getting in his exercise and walking some.  He knows he should do more, but is happy where he is.   Ryan Pittman continues to walk some.  He was headed to beach when we talked and usually walks more when he is down there.   Ryan Pittman has been staying busy with work.  It helps him to stay active by going up and down the stairs.  He has not been doing much actual walking but will try.    Expected Outcomes  Short: Continue to walk at home.  Long: Continue to build strength and stamina.   Short: Continue to walk at home and try to go more often.  Long: Continue to rebuild stamina.   Short: Continue to try to get in a little more exercise. Long: Continue to rebuild activity levles.   Short: Try to walk more.  Long: Continue to increase activity.   Short: Get back into walking.  Long: Continue to be active.       Discharge Exercise Prescription (Final Exercise Prescription Changes): Exercise Prescription Changes - 11/20/18 0900      Response to Exercise   Blood Pressure (Admit)  126/64    Blood Pressure (Exercise)  136/70    Blood Pressure (Exit)  104/60    Heart Rate (Admit)  58 bpm    Heart Rate (Exercise)  78 bpm    Heart Rate (Exit)  53 bpm    Rating of Perceived Exertion (Exercise)  14    Symptoms  none    Duration  Continue with 30 min of  aerobic exercise without signs/symptoms of physical distress.    Intensity  THRR unchanged      Progression   Progression  Continue to progress workloads to maintain intensity without signs/symptoms of physical distress.    Average METs  2.64      Resistance Training   Training Prescription  Yes    Weight  5 lbs    Reps  10-15      Interval Training   Interval Training  No      Treadmill   MPH  2    Grade  0.5    Minutes  15    METs  2.67      Bike   Level  1    Watts  32  Minutes  15    METs  3.64      T5 Nustep   Level  1    Minutes  15    METs  1.9      Home Exercise Plan   Plans to continue exercise at  Home (comment)   walking   Frequency  Add 3 additional days to program exercise sessions.    Initial Home Exercises Provided  10/24/18       Nutrition:  Target Goals: Understanding of nutrition guidelines, daily intake of sodium <1524m, cholesterol <2059m calories 30% from fat and 7% or less from saturated fats, daily to have 5 or more servings of fruits and vegetables.  Biometrics:    Nutrition Therapy Plan and Nutrition Goals: Nutrition Therapy & Goals - 11/20/18 1209      Nutrition Therapy   Diet  Heart Healthy Low Sodium 1400-1600kcal (MSJ 70kg, 170cm, 77yo) BMI:24, waist: 37.5 inches (<40)    Protein (specify units)  55-60 grams    Fiber  30 grams   30-38g   Whole Grain Foods  3 servings   pt eats packet of oatmeal each morning and tries to eat whole grain bread   Saturated Fats  12 max. grams   <12-16g (<7% of kcal)   Fruits and Vegetables  5 servings/day   patient tries to eat things like lima beans, black eyed peas, turnip greens, bananas, blueberries   Sodium  1.5 grams   150044m   Personal Nutrition Goals   Nutrition Goal  ST: Continue to make healthy changes such as incorporating F/V, whole grains, lean meats, etc. LT: lower cholesterol (patient reports Dr. told him his cholesterol was getting high and he needs to work on it  (11/19/18) pt reports that he is now on more medication to lower cholesterol)    Comments  Patient reports eating more grilled chicken and salmon and less pork and country ham (which he loves). Pt reports that he does not want to lose weight (his weight is WNL). Pt reports coninuing with exercise at home. Discussed MyPlate with patient and his kcal and protein goals; pt seems to be doing well with goals set by team.       Intervention Plan   Intervention  Prescribe, educate and counsel regarding individualized specific dietary modifications aiming towards targeted core components such as weight, hypertension, lipid management, diabetes, heart failure and other comorbidities.    Expected Outcomes  Short Term Goal: A plan has been developed with personal nutrition goals set during dietitian appointment.;Long Term Goal: Adherence to prescribed nutrition plan.       Nutrition Assessments:   Nutrition Goals Re-Evaluation: Nutrition Goals Re-Evaluation    [place] 02/20/19 1115             Goals   Nutrition Goal  ST: increase F/V intake, choose lean protein and whole grains LT: lower cholesterol       Comment  pt reports that he is going to get medication to help lower cholesterol (just came into pharmacy). Pt didn't seem interested in making changes or additional goals. He reported that since we last spoke he didn't make any changes, but he reports it's because everything is closed. He will continue to try to reach goals set. based on evaluation, do not believ pt is motivated/ready to make any change. Will try again when speak to him next.       Expected Outcome  Lower cholesterol and make changes  Nutrition Goals Discharge (Final Nutrition Goals Re-Evaluation): Nutrition Goals Re-Evaluation - 02/20/19 1115      Goals   Nutrition Goal  ST: increase F/V intake, choose lean protein and whole grains LT: lower cholesterol    Comment  pt reports that he is going to get medication to help  lower cholesterol (just came into pharmacy). Pt didn't seem interested in making changes or additional goals. He reported that since we last spoke he didn't make any changes, but he reports it's because everything is closed. He will continue to try to reach goals set. based on evaluation, do not believ pt is motivated/ready to make any change. Will try again when speak to him next.    Expected Outcome  Lower cholesterol and make changes       Psychosocial: Target Goals: Acknowledge presence or absence of significant depression and/or stress, maximize coping skills, provide positive support system. Participant is able to verbalize types and ability to use techniques and skills needed for reducing stress and depression.   Initial Review & Psychosocial Screening:   Quality of Life Scores:   Scores of 19 and below usually indicate a poorer quality of life in these areas.  A difference of  2-3 points is a clinically meaningful difference.  A difference of 2-3 points in the total score of the Quality of Life Index has been associated with significant improvement in overall quality of life, self-image, physical symptoms, and general health in studies assessing change in quality of life.  PHQ-9: Recent Review Flowsheet Data    Depression screen [facility] 2/9 04/25/2019 04/04/2019 09/23/2018 03/27/2018 03/22/2017   Decreased Interest 0 0 0 0 0   Down, Depressed, Hopeless 0 0 0 0 0   PHQ - 2 Score 0 0 0 0 0   Altered sleeping - 0 0 0 -   Tired, decreased energy - 0 1 0 -   Change in appetite - 0 0 0 -   Feeling bad or failure about yourself  - 0 0 0 -   Trouble concentrating - 0 0 0 -   Moving slowly or fidgety/restless - 0 0 0 -   Suicidal thoughts - 0 0 0 -   PHQ-9 Score - 0 1 0 -   Difficult doing work/chores - - Somewhat difficult Not difficult at all -     Interpretation of Total Score  Total Score Depression Severity:  1-4 = Minimal depression, 5-9 = Mild depression, 10-14 = Moderate depression,  15-19 = Moderately severe depression, 20-27 = Severe depression   Psychosocial Evaluation and Intervention:   Psychosocial Re-Evaluation: Psychosocial Re-Evaluation    Row Name 12/05/18 0960 12/25/18 1330 01/16/19 1155 02/07/19 1427       Psychosocial Re-Evaluation   Current issues with  Current Stress Concerns  Current Stress Concerns  Current Stress Concerns  Current Stress Concerns    Comments  Ryan Pittman is doing well at home.  He is trying to stay postivie with everything going on. 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  He is not getting enough exercise but is trying to stay active.   Ryan Pittman is doing well at home. 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  He is pleased to be able to continue to do business.  He is sleeping well and in a good place mentally.   Ryan Pittman was on  his way to the beach.  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    Ryan Pittman is staying busy and working.  They are limiting their customers and only allowing them in certain areas.  He is still considering the app at this point.     Expected Outcomes  Short: Continue to maintain business as able.  Long: Continue to exercise for stress management.   Short: Continue to run business safely.  Long: Contnue to cope positively!  Short: Continue to run business safely.  Long: Contnue to cope positively!  Short: Continue to be safe working. Long: Continue to stay positive.     Interventions  Encouraged to attend Cardiac Rehabilitation for the exercise;Stress management education  Encouraged to attend Cardiac Rehabilitation for the exercise;Stress management education  -  Encouraged to attend Cardiac Rehabilitation for the exercise;Stress management education    Continue Psychosocial Services   Follow up required by staff  Follow up required by staff  -  Follow up  required by staff       Psychosocial Discharge (Final Psychosocial Re-Evaluation): Psychosocial Re-Evaluation - 02/07/19 1427      Psychosocial Re-Evaluation   Current issues with  Current Stress Concerns    Comments  Ryan Pittman is staying busy and working.  They are limiting their customers and only allowing them in certain areas.  He is still considering the app at this point.     Expected Outcomes  Short: Continue to be safe working. Long: Continue to stay positive.     Interventions  Encouraged to attend Cardiac Rehabilitation for the exercise;Stress management education    Continue Psychosocial Services   Follow up required by staff       Vocational Rehabilitation: Provide vocational rehab assistance to qualifying candidates.   Vocational Rehab Evaluation & Intervention:   Education: Education Goals: Education classes will be provided on a variety of topics geared toward better understanding of heart health and risk factor modification. Participant will state understanding/return demonstration of topics presented as noted by education test scores.  Learning Barriers/Preferences:   Education Topics:  AED/CPR: - Group verbal and written instruction with the use of models to demonstrate the basic use of the AED with the basic ABC's of resuscitation.   Cardiac Rehab from 11/14/2018 in [facility] Cardiac and Pulmonary Rehab  Date  10/29/18  Educator  SB  Instruction Review Code  1- Verbalizes Understanding      General Nutrition Guidelines/Fats and Fiber: -Group instruction provided by verbal, written material, models and posters to present the general guidelines for heart healthy nutrition. Gives an explanation and review of dietary fats and fiber.   Cardiac Rehab from 11/14/2018 in [facility] Cardiac and Pulmonary Rehab  Date  11/12/18  Educator  [facility]  Instruction Review Code  1- Verbalizes Understanding      Controlling Sodium/Reading Food Labels: -Group verbal and written material  supporting the discussion of sodium use in heart healthy nutrition. Review and explanation with models, verbal and written materials for utilization of the food label.   Cardiac Rehab from 11/14/2018 in [facility] Cardiac and Pulmonary Rehab  Date  11/14/18  Educator  [facility]  Instruction Review Code  1- Verbalizes Understanding      Exercise Physiology & General Exercise Guidelines: - Group verbal and written instruction with models to review the exercise physiology of the cardiovascular system and associated critical values. Provides general exercise guidelines with specific guidelines to those with heart or  lung disease.    Aerobic Exercise & Resistance Training: - Gives group verbal and written instruction on the various components of exercise. Focuses on aerobic and resistive training programs and the benefits of this training and how to safely progress through these programs..   Flexibility, Balance, Mind/Body Relaxation: Provides group verbal/written instruction on the benefits of flexibility and balance training, including mind/body exercise modes such as yoga, pilates and tai chi.  Demonstration and skill practice provided.   Cardiac Rehab from 11/14/2018 in [facility] Cardiac and Pulmonary Rehab  Date  10/01/18  Educator  AS  Instruction Review Code  1- Verbalizes Understanding      Stress and Anxiety: - Provides group verbal and written instruction about the health risks of elevated stress and causes of high stress.  Discuss the correlation between heart/lung disease and anxiety and treatment options. Review healthy ways to manage with stress and anxiety.   Cardiac Rehab from 11/14/2018 in [facility] Cardiac and Pulmonary Rehab  Date  10/22/18  Educator  [facility]  Instruction Review Code  1- Verbalizes Understanding      Depression: - Provides group verbal and written instruction on the correlation between heart/lung disease and depressed mood, treatment options, and the stigmas associated with seeking  treatment.   Anatomy & Physiology of the Heart: - Group verbal and written instruction and models provide basic cardiac anatomy and physiology, with the coronary electrical and arterial systems. Review of Valvular disease and Heart Failure   Cardiac Rehab from 11/14/2018 in [facility] Cardiac and Pulmonary Rehab  Date  10/10/18  Educator  CE  Instruction Review Code  1- Verbalizes Understanding      Cardiac Procedures: - Group verbal and written instruction to review commonly prescribed medications for heart disease. Reviews the medication, class of the drug, and side effects. Includes the steps to properly store meds and maintain the prescription regimen. (beta blockers and nitrates)   Cardiac Rehab from 11/14/2018 in [facility] Cardiac and Pulmonary Rehab  Date  10/24/18  Educator  CE  Instruction Review Code  1- Verbalizes Understanding      Cardiac Medications I: - Group verbal and written instruction to review commonly prescribed medications for heart disease. Reviews the medication, class of the drug, and side effects. Includes the steps to properly store meds and maintain the prescription regimen.   Cardiac Rehab from 11/14/2018 in [facility] Cardiac and Pulmonary Rehab  Date  10/15/18  Educator  SB  Instruction Review Code  1- Verbalizes Understanding      Cardiac Medications II: -Group verbal and written instruction to review commonly prescribed medications for heart disease. Reviews the medication, class of the drug, and side effects. (all other drug classes)   Cardiac Rehab from 11/14/2018 in [facility] Cardiac and Pulmonary Rehab  Date  10/03/18  Educator  CE  Instruction Review Code  1- Verbalizes Understanding       Go Sex-Intimacy & Heart Disease, Get SMART - Goal Setting: - Group verbal and written instruction through game format to discuss heart disease and the return to sexual intimacy. Provides group verbal and written material to discuss and apply goal setting through the application  of the S.M.A.R.T. Method.   Cardiac Rehab from 11/14/2018 in [facility] Cardiac and Pulmonary Rehab  Date  10/24/18  Educator  CE  Instruction Review Code  1- Verbalizes Understanding      Other Matters of the Heart: - Provides group verbal, written materials and models to describe Stable Angina and Peripheral Artery. Includes description of  the disease process and treatment options available to the cardiac patient.   Cardiac Rehab from 11/14/2018 in [facility] Cardiac and Pulmonary Rehab  Date  10/10/18  Educator  CE  Instruction Review Code  1- Verbalizes Understanding      Exercise & Equipment Safety: - Individual verbal instruction and demonstration of equipment use and safety with use of the equipment.   Cardiac Rehab from 11/14/2018 in [facility] Cardiac and Pulmonary Rehab  Date  09/23/18  Educator  [facility]  Instruction Review Code  1- Verbalizes Understanding      Infection Prevention: - Provides verbal and written material to individual with discussion of infection control including proper hand washing and proper equipment cleaning during exercise session.   Cardiac Rehab from 11/14/2018 in [facility] Cardiac and Pulmonary Rehab  Date  09/23/18  Educator  [facility]  Instruction Review Code  1- Verbalizes Understanding      Falls Prevention: - Provides verbal and written material to individual with discussion of falls prevention and safety.   Cardiac Rehab from 11/14/2018 in [facility] Cardiac and Pulmonary Rehab  Date  09/23/18  Educator  [facility]  Instruction Review Code  1- Verbalizes Understanding      Diabetes: - Individual verbal and written instruction to review signs/symptoms of diabetes, desired ranges of glucose level fasting, after meals and with exercise. Acknowledge that pre and post exercise glucose checks will be done for 3 sessions at entry of program.   Know Your Numbers and Risk Factors: -Group verbal and written instruction about important numbers in your health.  Discussion of what are risk  factors and how they play a role in the disease process.  Review of Cholesterol, Blood Pressure, Diabetes, and BMI and the role they play in your overall health.   Cardiac Rehab from 11/14/2018 in [facility] Cardiac and Pulmonary Rehab  Date  10/03/18  Educator  CE  Instruction Review Code  1- Verbalizes Understanding      Sleep Hygiene: -Provides group verbal and written instruction about how sleep can affect your health.  Define sleep hygiene, discuss sleep cycles and impact of sleep habits. Review good sleep hygiene tips.    Other: -Provides group and verbal instruction on various topics (see comments)   Knowledge Questionnaire Score:   Core Components/Risk Factors/Patient Goals at Admission:   Core Components/Risk Factors/Patient Goals Review:  Goals and Risk Factor Review    Row Name 12/05/18 0926 12/25/18 1326 01/16/19 1155         Core Components/Risk Factors/Patient Goals Review   Personal Goals Review  Hypertension;Weight Management/Obesity  Hypertension;Weight Management/Obesity  Hypertension;Weight Management/Obesity     Review  Ryan Pittman has been doing well at home.  His weight continues to stay pretty steady.  He continues to check his blood pressures daily and has been getting good numbers.   Ryan Pittman is doing well.  He is still maintaining his weight.  He continues to check his pressures and they have been between 120-130s/50-60s.    Ryan Pittman continues to maintain and says his numbers are still looking good!     Expected Outcomes  Short: Continue to keep weight down.  Long: Continue to monitor risk factors.   Short: Continue to work on weight loss. Long: Continue to montior risk factors.   Short: Continue to work on weight loss. Long: Continue to montior risk factors.         Core Components/Risk Factors/Patient Goals at Discharge (Final Review):  Goals and Risk Factor Review - 01/16/19 1155  Core Components/Risk Factors/Patient Goals Review   Personal Goals Review   Hypertension;Weight Management/Obesity    Review  Ryan Pittman continues to maintain and says his numbers are still looking good!    Expected Outcomes  Short: Continue to work on weight loss. Long: Continue to montior risk factors.        ITP Comments: ITP Comments    Row Name 11/20/18 0948 11/27/18 1210 03/19/19 1112 05/14/19 1428     ITP Comments  Our program is currently closed due to COVID-19.  We are communicating with patient via phone calls and emails.    30 day review. Continue with ITP unless directed changes by Medical Director chart review.  30 day review cycle restarting  after being closed since March 16 because of  Covid 19 pandemic. Program opened to patients on July 6. Not all have returned. ITP updated and sent to Medical Director for review,changes as needed and signature  Pt never returned once we reopened after the COVID-19 closure.  Discharge ITP and  summary created and sent for review.       Comments: Discharge ITP

## 2019-05-19 ENCOUNTER — Other Ambulatory Visit: Payer: Self-pay | Admitting: Family Medicine

## 2019-05-19 DIAGNOSIS — Z23 Encounter for immunization: Secondary | ICD-10-CM | POA: Diagnosis not present

## 2019-06-10 ENCOUNTER — Other Ambulatory Visit: Payer: Self-pay | Admitting: Family Medicine

## 2019-06-16 DIAGNOSIS — Z23 Encounter for immunization: Secondary | ICD-10-CM | POA: Diagnosis not present

## 2019-06-16 DIAGNOSIS — E890 Postprocedural hypothyroidism: Secondary | ICD-10-CM | POA: Diagnosis not present

## 2019-06-16 DIAGNOSIS — C73 Malignant neoplasm of thyroid gland: Secondary | ICD-10-CM | POA: Diagnosis not present

## 2019-06-19 ENCOUNTER — Other Ambulatory Visit: Payer: Self-pay

## 2019-06-19 ENCOUNTER — Ambulatory Visit (INDEPENDENT_AMBULATORY_CARE_PROVIDER_SITE_OTHER): Payer: Medicare Other | Admitting: Cardiovascular Disease

## 2019-06-19 ENCOUNTER — Encounter: Payer: Self-pay | Admitting: Cardiovascular Disease

## 2019-06-19 DIAGNOSIS — I150 Renovascular hypertension: Secondary | ICD-10-CM

## 2019-06-19 DIAGNOSIS — I739 Peripheral vascular disease, unspecified: Secondary | ICD-10-CM | POA: Diagnosis not present

## 2019-06-19 DIAGNOSIS — I701 Atherosclerosis of renal artery: Secondary | ICD-10-CM | POA: Diagnosis not present

## 2019-06-19 DIAGNOSIS — E782 Mixed hyperlipidemia: Secondary | ICD-10-CM

## 2019-06-19 DIAGNOSIS — I10 Essential (primary) hypertension: Secondary | ICD-10-CM

## 2019-06-19 NOTE — H&P (View-Only) (Signed)
06/19/2019 [name]   1941/04/04  [id number]  Primary Physician Tonia Ghent, MD Primary Cardiologist: [name] MD [name], [name]  HPI:  Ryan Pittman is a 78 y.o.  mildly overweight married Caucasian male father of 2, grandfather for grandchildren .[name] He is referred by Dr. Elsie Stain for cardiovascular evaluation because of chest pain.I last saw him in the office  02/14/2019.His risk factors include 50 pack years of tobacco abuse having quit 8 years ago as well as treated hypertension and hyperlipidemia. There is no family history. He is never had a heart attack or stroke. He does complain of some chest pain that began several months ago occurring on a monthly basis lasting minutes at a time. He does have a history of PAD status post left femoropopliteal bypass grafting several years ago by Dr. Sherron Flemings.  He had a positive stress test and subsequent coronary CTA on 05/24/2018 revealing a high calcium score of 2858 with three-vessel calcification. He ultimately underwent cardiac catheterization by myself 06/24/2018 revealing three-vessel disease with preserved LV function. He ultimately underwent CABG x5 by Dr. Cyndia Bent 07/11/2018 and has done well since. He participated in cardiac rehab.   Since I saw him in the office 3 months ago he was begun on Repatha we will recheck his lipid liver profile.  He denies chest pain or shortness of breath.  He continues to complain of lifestyle limiting right calf claudication with lower extremity Dopplers performed 08/05/2018 revealing right ABI 0.78 and left 2.90.  A patent left femoropopliteal bypass graft and short segment occlusion distal right common femoral artery and high-grade disease in the proximal right SFA.  He wishes to proceed with angiography and potential endovascular therapy.    Current Meds  Medication Sig  . amiodarone (PACERONE) 200 MG tablet TAKE 1 TABLET BY MOUTH DAILY  . amLODipine  (NORVASC) 2.5 MG tablet TAKE ONE TABLET BY MOUTH EVERY DAY  . clopidogrel (PLAVIX) 75 MG tablet TAKE ONE TABLET EVERY DAY  . Co-Enzyme Q-10 100 MG CAPS Take by mouth.  . Evolocumab (REPATHA SURECLICK) XX123456 MG/ML SOAJ Inject 140 mg into the skin every 14 (fourteen) days.  [name] levothyroxine (SYNTHROID, LEVOTHROID) 75 MCG tablet Take 75 mcg by mouth daily before breakfast. Takes 1.5 tabs every Thursday per pt  . losartan (COZAAR) 100 MG tablet TAKE ONE TABLET EVERY DAY  . metoprolol tartrate (LOPRESSOR) 25 MG tablet Take 0.5 tablets (12.5 mg total) by mouth 2 (two) times daily.  . Omega-3 Fatty Acids (FISH OIL OMEGA-3 PO) Take 1 capsule by mouth daily.   . pravastatin (PRAVACHOL) 40 MG tablet TAKE ONE TABLET BY MOUTH EVERY DAY  . rOPINIRole (REQUIP) 0.25 MG tablet Take 1-2 tablets (0.25-0.5 mg total) by mouth at bedtime.  . tamsulosin (FLOMAX) 0.4 MG CAPS capsule Take 1 capsule (0.4 mg total) by mouth daily.  . vitamin C (ASCORBIC ACID) 500 MG tablet Take 500 mg by mouth daily.     Allergies  Allergen Reactions  . Penicillins Itching and Other (See Comments)    PATIENT HAS HAD A PCN REACTION WITH IMMEDIATE RASH, FACIAL/TONGUE/THROAT SWELLING, SOB, OR LIGHTHEADEDNESS WITH HYPOTENSION:  #  #  YES  #  #  Has patient had a PCN reaction causing severe rash involving mucus membranes or skin necrosis: No Has patient had a PCN reaction that required hospitalization: No Has patient had a PCN reaction occurring within the last 10 years: No If all of the above  answers are "NO", then may proceed with Cephalosporin use.   . Ace Inhibitors Cough  . Aspirin Other (See Comments)    H/o GI bleed  . Celebrex [Celecoxib] Other (See Comments)    GI bleed  . Lipitor [Atorvastatin] Other (See Comments)    myalgias    Social History   Socioeconomic History  . Marital status: Married    Spouse name: Not on file  . Number of children: Not on file  . Years of education: Not on file  . Highest education level:  Not on file  Occupational History  . Not on file  Social Needs  . Financial resource strain: Not hard at all  . Food insecurity    Worry: Never true    Inability: Never true  . Transportation needs    Medical: No    Non-medical: No  Tobacco Use  . Smoking status: Former Smoker    Types: Cigarettes    Quit date: 09/04/2008    Years since quitting: 10.7  . Smokeless tobacco: Former Systems developer    Types: Chew  . Tobacco comment: HAs quit tobacco products 2009  Substance and Sexual Activity  . Alcohol use: Not Currently    Alcohol/week: 0.0 standard drinks  . Drug use: No  . Sexual activity: Yes  Lifestyle  . Physical activity    Days per week: 7 days    Minutes per session: 10 min  . Stress: Not at all  Relationships  . Social Herbalist on phone: Not on file    Gets together: Not on file    Attends religious service: Not on file    Active member of club or organization: Not on file    Attends meetings of clubs or organizations: Not on file    Relationship status: Not on file  . Intimate partner violence    Fear of current or ex partner: No    Emotionally abused: No    Physically abused: No    Forced sexual activity: No  Other Topics Concern  . Not on file  Social History Narrative   Married 50+ years, 2 kids   Runs [employer]   Likes to play golf     Review of Systems: General: negative for chills, fever, night sweats or weight changes.  Cardiovascular: negative for chest pain, dyspnea on exertion, edema, orthopnea, palpitations, paroxysmal nocturnal dyspnea or shortness of breath Dermatological: negative for rash Respiratory: negative for cough or wheezing Urologic: negative for hematuria Abdominal: negative for nausea, vomiting, diarrhea, bright red blood per rectum, melena, or hematemesis Neurologic: negative for visual changes, syncope, or dizziness All other systems reviewed and are otherwise negative except as noted above.    Blood pressure (!)  146/70, pulse (!) 53, temperature (!) 97.3 F (36.3 C), temperature source Temporal, height 5\' 6"  (1.676 m), weight 154 lb (69.9 kg), SpO2 96 %.  General appearance: alert and no distress Neck: no adenopathy, no JVD, supple, symmetrical, trachea midline, thyroid not enlarged, symmetric, no tenderness/mass/nodules and Soft bilateral carotid bruits Lungs: clear to auscultation bilaterally Heart: regular rate and rhythm, S1, S2 normal, no murmur, click, rub or gallop Extremities: extremities normal, atraumatic, no cyanosis or edema Pulses: 1+ right femoral, 2+ left femoral with bilateral bruits Skin: Skin color, texture, turgor normal. No rashes or lesions Neurologic: Alert and oriented X 3, normal strength and tone. Normal symmetric reflexes. Normal coordination and gait  EKG sinus bradycardia 53 without ST or T wave changes.  Personally  reviewed this EKG.  ASSESSMENT AND PLAN:   PVD (peripheral vascular disease) ([place]) History of PAD status post left femoropopliteal bypass grafting by Dr. Lucky Cowboy in [place] several years ago.  He does complain of right lower extremity claudication which is lifestyle limiting.  He had lower extremity arterial Doppler studies performed in our office 08/05/2018 revealing right ABI 0.78 and a left 0.9.  His femoropopliteal bypass graft is open.  He appeared to have a short segment occlusion of the distal right common femoral artery and high-grade proximal right SFA stenosis.  He wishes to proceed with angiography and potential intervention.  HTN (hypertension) History of essential hypertension with blood pressure measured today at 146/70.  He is on amlodipine losartan and metoprolol.  Hyperlipidemia History of hyperlipidemia on pravastatin and Repatha.  We will recheck a lipid liver profile.  Renovascular hypertension History of bilateral renal artery stenosis by renal duplex imaging 08/05/2018 with renal aortic ratios of 5.26 on the right and 6.79 on the left and  normal renal dimensions.      [name] MD FACP,FACC,FAHA, [facility] 06/19/2019 11:28 AM

## 2019-06-19 NOTE — Assessment & Plan Note (Signed)
History of hyperlipidemia on pravastatin and Repatha.  We will recheck a lipid liver profile.

## 2019-06-19 NOTE — Assessment & Plan Note (Signed)
History of PAD status post left femoropopliteal bypass grafting by Dr. Lucky Cowboy in [place] several years ago.  He does complain of right lower extremity claudication which is lifestyle limiting.  He had lower extremity arterial Doppler studies performed in our office 08/05/2018 revealing right ABI 0.78 and a left 0.9.  His femoropopliteal bypass graft is open.  He appeared to have a short segment occlusion of the distal right common femoral artery and high-grade proximal right SFA stenosis.  He wishes to proceed with angiography and potential intervention.

## 2019-06-19 NOTE — Progress Notes (Signed)
06/19/2019 [name]   [birth year]  [id number]  Primary Physician Tonia Ghent, MD Primary Cardiologist: [name] MD [name], [name]  HPI:  Ryan Pittman is a 78 y.o.  mildly overweight married Caucasian male father of 2, grandfather for grandchildren .[name] He is referred by Dr. Elsie Stain for cardiovascular evaluation because of chest pain.I last saw him in the office  02/14/2019.His risk factors include 50 pack years of tobacco abuse having quit 8 years ago as well as treated hypertension and hyperlipidemia. There is no family history. He is never had a heart attack or stroke. He does complain of some chest pain that began several months ago occurring on a monthly basis lasting minutes at a time. He does have a history of PAD status post left femoropopliteal bypass grafting several years ago by Dr. Sherron Flemings.  He had a positive stress test and subsequent coronary CTA on 05/24/2018 revealing a high calcium score of 2858 with three-vessel calcification. He ultimately underwent cardiac catheterization by myself 06/24/2018 revealing three-vessel disease with preserved LV function. He ultimately underwent CABG x5 by Dr. Cyndia Bent 07/11/2018 and has done well since. He participated in cardiac rehab.   Since I saw him in the office 3 months ago he was begun on Repatha we will recheck his lipid liver profile.  He denies chest pain or shortness of breath.  He continues to complain of lifestyle limiting right calf claudication with lower extremity Dopplers performed 08/05/2018 revealing right ABI 0.78 and left 2.90.  A patent left femoropopliteal bypass graft and short segment occlusion distal right common femoral artery and high-grade disease in the proximal right SFA.  He wishes to proceed with angiography and potential endovascular therapy.    Current Meds  Medication Sig  . amiodarone (PACERONE) 200 MG tablet TAKE 1 TABLET BY MOUTH DAILY  . amLODipine  (NORVASC) 2.5 MG tablet TAKE ONE TABLET BY MOUTH EVERY DAY  . clopidogrel (PLAVIX) 75 MG tablet TAKE ONE TABLET EVERY DAY  . Co-Enzyme Q-10 100 MG CAPS Take by mouth.  . Evolocumab (REPATHA SURECLICK) [id number] MG/ML SOAJ Inject 140 mg into the skin every 14 (fourteen) days.  [name] levothyroxine (SYNTHROID, LEVOTHROID) 75 MCG tablet Take 75 mcg by mouth daily before breakfast. Takes 1.5 tabs every Thursday per pt  . losartan (COZAAR) 100 MG tablet TAKE ONE TABLET EVERY DAY  . metoprolol tartrate (LOPRESSOR) 25 MG tablet Take 0.5 tablets (12.5 mg total) by mouth 2 (two) times daily.  . Omega-3 Fatty Acids (FISH OIL OMEGA-3 PO) Take 1 capsule by mouth daily.   . pravastatin (PRAVACHOL) 40 MG tablet TAKE ONE TABLET BY MOUTH EVERY DAY  . rOPINIRole (REQUIP) 0.25 MG tablet Take 1-2 tablets (0.25-0.5 mg total) by mouth at bedtime.  . tamsulosin (FLOMAX) 0.4 MG CAPS capsule Take 1 capsule (0.4 mg total) by mouth daily.  . vitamin C (ASCORBIC ACID) 500 MG tablet Take 500 mg by mouth daily.     Allergies  Allergen Reactions  . Penicillins Itching and Other (See Comments)    PATIENT HAS HAD A PCN REACTION WITH IMMEDIATE RASH, FACIAL/TONGUE/THROAT SWELLING, SOB, OR LIGHTHEADEDNESS WITH HYPOTENSION:  #  #  YES  #  #  Has patient had a PCN reaction causing severe rash involving mucus membranes or skin necrosis: No Has patient had a PCN reaction that required hospitalization: No Has patient had a PCN reaction occurring within the last 10 years: No If all of the above  answers are "NO", then may proceed with Cephalosporin use.   . Ace Inhibitors Cough  . Aspirin Other (See Comments)    H/o GI bleed  . Celebrex [Celecoxib] Other (See Comments)    GI bleed  . Lipitor [Atorvastatin] Other (See Comments)    myalgias    Social History   Socioeconomic History  . Marital status: Married    Spouse name: Not on file  . Number of children: Not on file  . Years of education: Not on file  . Highest education level:  Not on file  Occupational History  . Not on file  Social Needs  . Financial resource strain: Not hard at all  . Food insecurity    Worry: Never true    Inability: Never true  . Transportation needs    Medical: No    Non-medical: No  Tobacco Use  . Smoking status: Former Smoker    Types: Cigarettes    Quit date: 09/04/2008    Years since quitting: 10.7  . Smokeless tobacco: Former Systems developer    Types: Chew  . Tobacco comment: HAs quit tobacco products 2009  Substance and Sexual Activity  . Alcohol use: Not Currently    Alcohol/week: 0.0 standard drinks  . Drug use: No  . Sexual activity: Yes  Lifestyle  . Physical activity    Days per week: 7 days    Minutes per session: 10 min  . Stress: Not at all  Relationships  . Social Herbalist on phone: Not on file    Gets together: Not on file    Attends religious service: Not on file    Active member of club or organization: Not on file    Attends meetings of clubs or organizations: Not on file    Relationship status: Not on file  . Intimate partner violence    Fear of current or ex partner: No    Emotionally abused: No    Physically abused: No    Forced sexual activity: No  Other Topics Concern  . Not on file  Social History Narrative   Married 50+ years, 2 kids   Runs [employer]   Likes to play golf     Review of Systems: General: negative for chills, fever, night sweats or weight changes.  Cardiovascular: negative for chest pain, dyspnea on exertion, edema, orthopnea, palpitations, paroxysmal nocturnal dyspnea or shortness of breath Dermatological: negative for rash Respiratory: negative for cough or wheezing Urologic: negative for hematuria Abdominal: negative for nausea, vomiting, diarrhea, bright red blood per rectum, melena, or hematemesis Neurologic: negative for visual changes, syncope, or dizziness All other systems reviewed and are otherwise negative except as noted above.    Blood pressure (!)  146/70, pulse (!) 53, temperature (!) 97.3 F (36.3 C), temperature source Temporal, height 5\' 6"  (1.676 m), weight 154 lb (69.9 kg), SpO2 96 %.  General appearance: alert and no distress Neck: no adenopathy, no JVD, supple, symmetrical, trachea midline, thyroid not enlarged, symmetric, no tenderness/mass/nodules and Soft bilateral carotid bruits Lungs: clear to auscultation bilaterally Heart: regular rate and rhythm, S1, S2 normal, no murmur, click, rub or gallop Extremities: extremities normal, atraumatic, no cyanosis or edema Pulses: 1+ right femoral, 2+ left femoral with bilateral bruits Skin: Skin color, texture, turgor normal. No rashes or lesions Neurologic: Alert and oriented X 3, normal strength and tone. Normal symmetric reflexes. Normal coordination and gait  EKG sinus bradycardia 53 without ST or T wave changes.  Personally  reviewed this EKG.  ASSESSMENT AND PLAN:   PVD (peripheral vascular disease) ([place]) History of PAD status post left femoropopliteal bypass grafting by Dr. Lucky Cowboy in [place] several years ago.  He does complain of right lower extremity claudication which is lifestyle limiting.  He had lower extremity arterial Doppler studies performed in our office 08/05/2018 revealing right ABI 0.78 and a left 0.9.  His femoropopliteal bypass graft is open.  He appeared to have a short segment occlusion of the distal right common femoral artery and high-grade proximal right SFA stenosis.  He wishes to proceed with angiography and potential intervention.  HTN (hypertension) History of essential hypertension with blood pressure measured today at 146/70.  He is on amlodipine losartan and metoprolol.  Hyperlipidemia History of hyperlipidemia on pravastatin and Repatha.  We will recheck a lipid liver profile.  Renovascular hypertension History of bilateral renal artery stenosis by renal duplex imaging 08/05/2018 with renal aortic ratios of 5.26 on the right and 6.79 on the left and  normal renal dimensions.      [name] MD FACP,FACC,FAHA, [facility] 06/19/2019 11:28 AM

## 2019-06-19 NOTE — Patient Instructions (Addendum)
[place] [address] Dept: [id number] Loc: [place]  06/19/2019  You are scheduled for a Peripheral Angiogram on Thursday, October 22 with Dr. [name].  1. Please arrive at the [facility] (Main Entrance A) at [facility]: [address] [place], [place] [address] at 7:30 AM (This time is two hours before your procedure to ensure your preparation). Free valet parking service is available.   Special note: Every effort is made to have your procedure done on time. Please understand that emergencies sometimes delay scheduled procedures.  2. Diet: Do not eat solid foods after midnight.  The patient may have clear liquids until 5am upon the day of the procedure.  3. Labs:  You will need to have blood drawn today: FOR YOUR BASIC METABOLIC PANEL, COMPLETE BLOOD COUNT LAB WORK. NO APPOINTMENT IS NEEDED. YOU MUST HAVE THIS LAB WORK DONE BEFORE GOING TO GET YOUR COVID-19 TEST DONE BECAUSE YOU WILL NEED TO SELF-ISOLATE AFTER THE COVID-19 TEST UNTIL THE DAY OF YOUR [place].  You will need a COVID-!9 test on 06/23/2019. Your appointment is at 11:00am. Go to: [facility] Pre Admission Testing [facility]) [address], [place], [place] [address] FOR YOUR COVID-19 TEST. YOU MUST HAVE YOUR COVID-19 TEST COMPLETED 4 DAYS PRIOR TO YOUR UPCOMING PROCEDURE/TEST. YOU WILL ALSO NEED TO SELF-ISOLATE  AFTER THE COVID-19 TEST UNTIL THE DAY OF YOUR PROCEDURE/TEST. PLEASE BRING YOUR I.D. AND YOUR INSURANCE CARD(S) WITH YOU.   4. Medication instructions in preparation for your procedure:  On the morning of your procedure, take your Plavix/Clopidogrel and any morning medicines NOT listed above.  You may use sips of water.  5. Plan for one night stay--bring personal belongings. 6. Bring a current list of your medications and  current insurance cards. 7. You MUST have a responsible person to drive you home. 8. Someone MUST be with you the first 24 hours after you arrive home or your discharge will be delayed. 9. Please wear clothes that are easy to get on and off and wear slip-on shoes.  ______________________________________________________________________ AFTER PROCEDURE  Testing/Procedures:  Your physician has requested that you have an aorta/iliac duplex. During this test, an ultrasound is used to evaluate blood flow to the aorta and iliac arteries. Allow one hour for this exam. Do not eat after midnight the day before and avoid carbonated beverages. DUE AFTER YOUR PROCEDURE. TO BE SCHEDULED  Your physician has requested that you have an ankle brachial index (ABI). During this test an ultrasound and blood pressure cuff are used to evaluate the arteries that supply the arms and legs with blood. Allow thirty minutes for this exam. There are no restrictions or special instructions. DUE AFTER YOUR PROCEDURE. TO BE SCHEDULED   Follow-Up: At [facility], you and your health needs are our priority.  As part of our continuing mission to provide you with exceptional heart care, we have created designated Provider Care Teams.  These Care Teams include your primary Cardiologist (physician) and Advanced Practice Providers (APPs -  Physician Assistants and Nurse Practitioners) who all work together to provide you with the care you need, when you need it. [name] You will need a follow up appointment with Dr. [name] after your ultrasounds. To be scheduled.     Additional Information:   Your physician recommends that you return for lab work within 1 week:  LIPID AND LIVER PANELS. You may also go to the following [employer] locations in [place], [place]:  Address: [address] [place], [place] [address]  Address: [address] [place], [place], [place] [address]

## 2019-06-19 NOTE — Assessment & Plan Note (Signed)
History of bilateral renal artery stenosis by renal duplex imaging 08/05/2018 with renal aortic ratios of 5.26 on the right and 6.79 on the left and normal renal dimensions.

## 2019-06-19 NOTE — Assessment & Plan Note (Signed)
History of essential hypertension with blood pressure measured today at 146/70.  He is on amlodipine losartan and metoprolol.

## 2019-06-20 LAB — CBC
Hematocrit: 31 % — ABNORMAL LOW (ref 37.5–51.0)
Hemoglobin: 10.5 g/dL — ABNORMAL LOW (ref 13.0–17.7)
MCH: 31.2 pg (ref 26.6–33.0)
MCHC: 33.9 g/dL (ref 31.5–35.7)
MCV: 92 fL (ref 79–97)
Platelets: 265 10*3/uL (ref 150–450)
RBC: 3.37 x10E6/uL — ABNORMAL LOW (ref 4.14–5.80)
RDW: 14 % (ref 11.6–15.4)
WBC: 10.9 10*3/uL — ABNORMAL HIGH (ref 3.4–10.8)

## 2019-06-20 LAB — BASIC METABOLIC PANEL
BUN/Creatinine Ratio: 15 (ref 10–24)
BUN: 18 mg/dL (ref 8–27)
CO2: 23 mmol/L (ref 20–29)
Calcium: 9.3 mg/dL (ref 8.6–10.2)
Chloride: 105 mmol/L (ref 96–106)
Creatinine, Ser: 1.18 mg/dL (ref 0.76–1.27)
GFR calc Af Amer: 67 mL/min/{1.73_m2} (ref >59)
GFR calc non Af Amer: 58 mL/min/{1.73_m2} — ABNORMAL LOW (ref >59)
Glucose: 86 mg/dL (ref 65–99)
Potassium: 5 mmol/L (ref 3.5–5.2)
Sodium: 141 mmol/L (ref 134–144)

## 2019-06-23 ENCOUNTER — Other Ambulatory Visit: Payer: Self-pay

## 2019-06-23 ENCOUNTER — Inpatient Hospital Stay: Admission: RE | Admit: 2019-06-23 | Payer: Medicare Other | Source: Ambulatory Visit

## 2019-06-23 ENCOUNTER — Other Ambulatory Visit
Admission: RE | Admit: 2019-06-23 | Discharge: 2019-06-23 | Disposition: A | Discharge status: Home / Self Care | Payer: Medicare Other | Source: Ambulatory Visit | Attending: Cardiovascular Disease | Admitting: Cardiovascular Disease

## 2019-06-23 DIAGNOSIS — Z01812 Encounter for preprocedural laboratory examination: Secondary | ICD-10-CM | POA: Insufficient documentation

## 2019-06-23 DIAGNOSIS — Z20828 Contact with and (suspected) exposure to other viral communicable diseases: Secondary | ICD-10-CM | POA: Diagnosis not present

## 2019-06-24 LAB — SARS CORONAVIRUS 2 (TAT 6-24 HRS): SARS Coronavirus 2: NEGATIVE

## 2019-06-25 ENCOUNTER — Telehealth: Payer: Self-pay | Admitting: Cardiovascular Disease

## 2019-06-25 ENCOUNTER — Telehealth: Payer: Self-pay | Admitting: *Deleted

## 2019-06-25 NOTE — Telephone Encounter (Signed)
Spoke with pt wife, the patient usually takes his plavix in the evening and they were told to take it in the morning prior to the procedure, they are confused about what to do. Patient instructed to take his plavix tonight as usual and none tomorrow morning.

## 2019-06-25 NOTE — Telephone Encounter (Addendum)
Pt contacted pre-catheterization scheduled at [facility] for: Thursday June 26, 2019 9:30 AM Verified arrival time and place: [facility]) at: 7:30 AM   No solid food after midnight prior to cath, clear liquids until 5 AM day of procedure. Contrast allergy: no  Hold: Losartan-day before and day of procedure-GFR 58  AM meds can be  taken pre-cath with sip of water including: Plavix 75 mg   Confirmed patient has responsible adult to drive home post procedure and observe 24 hours after arriving home:   Currently, due to Covid-19 pandemic, only one support person will be allowed with patient. Must be the same support person for that patient's entire stay, will be screened and required to wear a mask. They will be asked to wait in the waiting room for the duration of the patient's stay.  Patients are required to wear a mask when they enter the hospital.      COVID-19 Pre-Screening Questions:  . In the past 7 to 10 days have you had a cough,  shortness of breath, headache, congestion, fever (100 or greater) body aches, chills, sore throat, or sudden loss of taste or sense of smell? . Have you been around anyone with known Covid 19. . Have you been around anyone who is awaiting Covid 19 test results in the past 7 to 10 days? . Have you been around anyone who has been exposed to Covid 19, or has mentioned symptoms of Covid 19 within the past 7 to 10 days?  I left detailed message on home phone number answering machine (DPR) with procedure instructions, asked pt to call our office if he had any new symptoms related to Covid-19 screening questions.

## 2019-06-25 NOTE — Telephone Encounter (Signed)
Left message to call back   Unable to locate who may have called.  Looks like pt spoke with Desiree Lucy, RN about procedure for tomorrow.

## 2019-06-25 NOTE — Telephone Encounter (Signed)
Follow Up:     Pt is returning a call  From today, pt does not know who called him.[name]

## 2019-06-26 ENCOUNTER — Other Ambulatory Visit: Payer: Self-pay

## 2019-06-26 ENCOUNTER — Ambulatory Visit (HOSPITAL_COMMUNITY)
Admission: RE | Admit: 2019-06-26 | Discharge: 2019-06-26 | Disposition: A | Discharge status: Home / Self Care | Payer: Medicare Other | Attending: Cardiovascular Disease | Admitting: Cardiovascular Disease

## 2019-06-26 ENCOUNTER — Encounter (HOSPITAL_COMMUNITY)
Admission: RE | Disposition: A | Discharge status: Home / Self Care | Payer: Self-pay | Source: Home / Self Care | Attending: Cardiovascular Disease

## 2019-06-26 DIAGNOSIS — Z79899 Other long term (current) drug therapy: Secondary | ICD-10-CM | POA: Insufficient documentation

## 2019-06-26 DIAGNOSIS — Z886 Allergy status to analgesic agent status: Secondary | ICD-10-CM | POA: Diagnosis not present

## 2019-06-26 DIAGNOSIS — Z87891 Personal history of nicotine dependence: Secondary | ICD-10-CM | POA: Insufficient documentation

## 2019-06-26 DIAGNOSIS — Z880 Allergy status to penicillin: Secondary | ICD-10-CM | POA: Insufficient documentation

## 2019-06-26 DIAGNOSIS — I70213 Atherosclerosis of native arteries of extremities with intermittent claudication, bilateral legs: Secondary | ICD-10-CM | POA: Diagnosis not present

## 2019-06-26 DIAGNOSIS — Z888 Allergy status to other drugs, medicaments and biological substances status: Secondary | ICD-10-CM | POA: Insufficient documentation

## 2019-06-26 DIAGNOSIS — I10 Essential (primary) hypertension: Secondary | ICD-10-CM | POA: Diagnosis not present

## 2019-06-26 DIAGNOSIS — Z79890 Hormone replacement therapy (postmenopausal): Secondary | ICD-10-CM | POA: Diagnosis not present

## 2019-06-26 DIAGNOSIS — E785 Hyperlipidemia, unspecified: Secondary | ICD-10-CM | POA: Insufficient documentation

## 2019-06-26 DIAGNOSIS — I70211 Atherosclerosis of native arteries of extremities with intermittent claudication, right leg: Secondary | ICD-10-CM | POA: Diagnosis not present

## 2019-06-26 DIAGNOSIS — Z951 Presence of aortocoronary bypass graft: Secondary | ICD-10-CM | POA: Insufficient documentation

## 2019-06-26 DIAGNOSIS — I701 Atherosclerosis of renal artery: Secondary | ICD-10-CM | POA: Insufficient documentation

## 2019-06-26 DIAGNOSIS — Z7902 Long term (current) use of antithrombotics/antiplatelets: Secondary | ICD-10-CM | POA: Diagnosis not present

## 2019-06-26 DIAGNOSIS — I150 Renovascular hypertension: Secondary | ICD-10-CM | POA: Insufficient documentation

## 2019-06-26 DIAGNOSIS — I739 Peripheral vascular disease, unspecified: Secondary | ICD-10-CM | POA: Diagnosis present

## 2019-06-26 HISTORY — PX: ABDOMINAL AORTOGRAM W/LOWER EXTREMITY: CATH118223

## 2019-06-26 SURGERY — ABDOMINAL AORTOGRAM W/LOWER EXTREMITY
Anesthesia: LOCAL | Laterality: Bilateral

## 2019-06-26 MED ORDER — HEPARIN (PORCINE) IN NACL 1000-0.9 UT/500ML-% IV SOLN
INTRAVENOUS | Status: DC | PRN
  Administered 2019-06-26 (×2): 500 mL

## 2019-06-26 MED ORDER — SODIUM CHLORIDE 0.9 % WEIGHT BASED INFUSION: 1.0000 mL/kg/h | INTRAVENOUS | Status: DC

## 2019-06-26 MED ORDER — SODIUM CHLORIDE 0.9 % IV SOLN: INTRAVENOUS | Status: AC

## 2019-06-26 MED ORDER — SODIUM CHLORIDE 0.9% FLUSH: 3.0000 mL | INTRAVENOUS | Status: DC | PRN

## 2019-06-26 MED ORDER — SODIUM CHLORIDE 0.9% FLUSH: 3.0000 mL | Freq: Two times a day (BID) | INTRAVENOUS | Status: DC

## 2019-06-26 MED ORDER — HYDRALAZINE HCL 20 MG/ML IJ SOLN
5.0000 mg | INTRAMUSCULAR | Status: AC | PRN
  Administered 2019-06-26 (×2): 5 mg via INTRAVENOUS

## 2019-06-26 MED ORDER — MIDAZOLAM HCL 2 MG/2ML IJ SOLN
INTRAMUSCULAR | Status: AC
  Filled 2019-06-26: qty 2

## 2019-06-26 MED ORDER — HEPARIN (PORCINE) IN NACL 1000-0.9 UT/500ML-% IV SOLN
INTRAVENOUS | Status: AC
  Filled 2019-06-26: qty 1000

## 2019-06-26 MED ORDER — SODIUM CHLORIDE 0.9 % IV SOLN: 250.0000 mL | INTRAVENOUS | Status: DC | PRN

## 2019-06-26 MED ORDER — LIDOCAINE HCL (PF) 1 % IJ SOLN
INTRAMUSCULAR | Status: DC | PRN
  Administered 2019-06-26: 15 mL via INTRADERMAL
  Administered 2019-06-26: 25 mL via INTRADERMAL

## 2019-06-26 MED ORDER — ACETAMINOPHEN 325 MG PO TABS: 650.0000 mg | ORAL_TABLET | ORAL | Status: DC | PRN

## 2019-06-26 MED ORDER — IODIXANOL 320 MG/ML IV SOLN
INTRAVENOUS | Status: DC | PRN
  Administered 2019-06-26: 125 mL via INTRA_ARTERIAL

## 2019-06-26 MED ORDER — MORPHINE SULFATE (PF) 2 MG/ML IV SOLN: 2.0000 mg | INTRAVENOUS | Status: DC | PRN

## 2019-06-26 MED ORDER — HYDRALAZINE HCL 20 MG/ML IJ SOLN
INTRAMUSCULAR | Status: AC
  Filled 2019-06-26: qty 1

## 2019-06-26 MED ORDER — FENTANYL CITRATE (PF) 100 MCG/2ML IJ SOLN
INTRAMUSCULAR | Status: DC | PRN
  Administered 2019-06-26: 25 ug via INTRAVENOUS

## 2019-06-26 MED ORDER — MIDAZOLAM HCL 2 MG/2ML IJ SOLN
INTRAMUSCULAR | Status: DC | PRN
  Administered 2019-06-26: 1 mg via INTRAVENOUS

## 2019-06-26 MED ORDER — LABETALOL HCL 5 MG/ML IV SOLN: 10.0000 mg | INTRAVENOUS | Status: DC | PRN

## 2019-06-26 MED ORDER — ONDANSETRON HCL 4 MG/2ML IJ SOLN: 4.0000 mg | Freq: Four times a day (QID) | INTRAMUSCULAR | Status: DC | PRN

## 2019-06-26 MED ORDER — CLOPIDOGREL BISULFATE 75 MG PO TABS
75.0000 mg | ORAL_TABLET | ORAL | Status: AC
  Administered 2019-06-26: 75 mg via ORAL
  Filled 2019-06-26: qty 1

## 2019-06-26 MED ORDER — LIDOCAINE HCL (PF) 1 % IJ SOLN
INTRAMUSCULAR | Status: AC
  Filled 2019-06-26: qty 30

## 2019-06-26 MED ORDER — FENTANYL CITRATE (PF) 100 MCG/2ML IJ SOLN
INTRAMUSCULAR | Status: AC
  Filled 2019-06-26: qty 2

## 2019-06-26 MED ORDER — SODIUM CHLORIDE 0.9 % WEIGHT BASED INFUSION
3.0000 mL/kg/h | INTRAVENOUS | Status: AC
  Administered 2019-06-26: 3 mL/kg/h via INTRAVENOUS

## 2019-06-26 SURGICAL SUPPLY — 12 items
CATH ANGIO 5F BER2 65CM (CATHETERS) ×2 IMPLANT
CATH ANGIO 5F PIGTAIL 65CM (CATHETERS) ×2 IMPLANT
CATH CROSS OVER TEMPO 5F (CATHETERS) ×2 IMPLANT
CATH STRAIGHT 5FR 65CM (CATHETERS) ×2 IMPLANT
GUIDEWIRE ANGLED .035X150CM (WIRE) ×1 IMPLANT
KIT PV (KITS) ×2 IMPLANT
SHEATH PINNACLE 5F 10CM (SHEATH) ×1 IMPLANT
SHEATH PROBE COVER 6X72 (BAG) ×1 IMPLANT
SYR MEDRAD MARK 7 150ML (SYRINGE) ×2 IMPLANT
TRANSDUCER W/STOPCOCK (MISCELLANEOUS) ×2 IMPLANT
TRAY PV CATH (CUSTOM PROCEDURE TRAY) ×2 IMPLANT
WIRE HITORQ VERSACORE ST 145CM (WIRE) ×4 IMPLANT

## 2019-06-26 NOTE — Progress Notes (Signed)
No bleeding or swelling noted after ambulation 

## 2019-06-26 NOTE — Interval H&P Note (Signed)
History and Physical Interval Note:  06/26/2019 9:05 AM  Ryan Pittman  has presented today for surgery, with the diagnosis of PVD.  The various methods of treatment have been discussed with the patient and family. After consideration of risks, benefits and other options for treatment, the patient has consented to  Procedure(s): ABDOMINAL AORTOGRAM W/LOWER EXTREMITY (Bilateral) as a surgical intervention.  The patient's history has been reviewed, patient examined, no change in status, stable for surgery.  I have reviewed the patient's chart and labs.  Questions were answered to the patient's satisfaction.     [name]

## 2019-06-26 NOTE — Progress Notes (Signed)
30fr sheath aspirated and removed from left femoral artery. Manual pressure applied for 20 minutes. Site level 0, no S+S of hematoma. Tegaderm dressing applied, bedrest instructions given.   Bilateral dp and pt pulses present with doppler.   Left pt was very faint with doppler.  Bedrest begins at 10:45:00

## 2019-06-26 NOTE — Discharge Instructions (Signed)
Femoral Site Care °This sheet gives you information about how to care for yourself after your procedure. Your health care provider may also give you more specific instructions. If you have problems or questions, contact your health care provider. °What can I expect after the procedure? °After the procedure, it is common to have: °· Bruising that usually fades within 1-2 weeks. °· Tenderness at the site. °Follow these instructions at home: °Wound care °· Follow instructions from your health care provider about how to take care of your insertion site. Make sure you: °? Wash your hands with soap and water before you change your bandage (dressing). If soap and water are not available, use hand sanitizer. °? Change your dressing as told by your health care provider. °? Leave stitches (sutures), skin glue, or adhesive strips in place. These skin closures may need to stay in place for 2 weeks or longer. If adhesive strip edges start to loosen and curl up, you may trim the loose edges. Do not remove adhesive strips completely unless your health care provider tells you to do that. °· Do not take baths, swim, or use a hot tub until your health care provider approves. °· You may shower 24-48 hours after the procedure or as told by your health care provider. °? Gently wash the site with plain soap and water. °? Pat the area dry with a clean towel. °? Do not rub the site. This may cause bleeding. °· Do not apply powder or lotion to the site. Keep the site clean and dry. °· Check your femoral site every day for signs of infection. Check for: °? Redness, swelling, or pain. °? Fluid or blood. °? Warmth. °? Pus or a bad smell. °Activity °· For the first 2-3 days after your procedure, or as long as directed: °? Avoid climbing stairs as much as possible. °? Do not squat. °· Do not lift anything that is heavier than 10 lb (4.5 kg), or the limit that you are told, until your health care provider says that it is safe. °· Rest as  directed. °? Avoid sitting for a long time without moving. Get up to take short walks every 1-2 hours. °· Do not drive for 24 hours if you were given a medicine to help you relax (sedative). °General instructions °· Take over-the-counter and prescription medicines only as told by your health care provider. °· Keep all follow-up visits as told by your health care provider. This is important. °Contact a health care provider if you have: °· A fever or chills. °· You have redness, swelling, or pain around your insertion site. °Get help right away if: °· The catheter insertion area swells very fast. °· You pass out. °· You suddenly start to sweat or your skin gets clammy. °· The catheter insertion area is bleeding, and the bleeding does not stop when you hold steady pressure on the area. °· The area near or just beyond the catheter insertion site becomes pale, cool, tingly, or numb. °These symptoms may represent a serious problem that is an emergency. Do not wait to see if the symptoms will go away. Get medical help right away. Call your local emergency services (911 in the [place] Do not drive yourself to the hospital. °Summary °· After the procedure, it is common to have bruising that usually fades within 1-2 weeks. °· Check your femoral site every day for signs of infection. °· Do not lift anything that is heavier than 10 lb (4.5 kg), or the   limit that you are told, until your health care provider says that it is safe. °This information is not intended to replace advice given to you by your health care provider. Make sure you discuss any questions you have with your health care provider. °Document Released: 04/24/2014 Document Revised: 09/03/2017 Document Reviewed: 09/03/2017 °Elsevier Patient Education © 2020 [employer] ° °

## 2019-06-27 ENCOUNTER — Encounter (HOSPITAL_COMMUNITY): Payer: Self-pay | Admitting: Cardiovascular Disease

## 2019-06-30 ENCOUNTER — Other Ambulatory Visit: Payer: Self-pay

## 2019-06-30 ENCOUNTER — Telehealth: Payer: Self-pay | Admitting: Family Medicine

## 2019-06-30 ENCOUNTER — Encounter (HOSPITAL_COMMUNITY): Payer: Self-pay | Admitting: Cardiovascular Disease

## 2019-06-30 ENCOUNTER — Ambulatory Visit (INDEPENDENT_AMBULATORY_CARE_PROVIDER_SITE_OTHER): Payer: Medicare Other | Admitting: Family Medicine

## 2019-06-30 DIAGNOSIS — M25579 Pain in unspecified ankle and joints of unspecified foot: Secondary | ICD-10-CM

## 2019-06-30 DIAGNOSIS — Z20828 Contact with and (suspected) exposure to other viral communicable diseases: Secondary | ICD-10-CM | POA: Diagnosis not present

## 2019-06-30 DIAGNOSIS — I739 Peripheral vascular disease, unspecified: Secondary | ICD-10-CM

## 2019-06-30 DIAGNOSIS — Z20822 Contact with and (suspected) exposure to covid-19: Secondary | ICD-10-CM

## 2019-06-30 DIAGNOSIS — I701 Atherosclerosis of renal artery: Secondary | ICD-10-CM

## 2019-06-30 NOTE — Telephone Encounter (Signed)
Patient advised.

## 2019-06-30 NOTE — Telephone Encounter (Addendum)
Please call pt and help him and his wife get set up for covid testing.  Thanks.

## 2019-06-30 NOTE — Progress Notes (Signed)
Virtual visit completed through WebEx or similar program Patient location: home  Provider location: Financial controller at [facility], office   Pandemic considerations d/w pt.   Limitations and rationale for visit method d/w patient.  Patient agreed to proceed.   CC: heel pain.   HPI:  Requip didn't help his nighttime sx.  Off med now.  L foot pain.  No pain walking.  No dorsal or med/lat malleoli pain.  Pain along the L achilles.  He doesn't have pain walking.  No L calf cramping but he has R leg cramping with walking.  He had vascular eval with Dr. Gwenlyn Found.    He has a cough with recent cold sx.  Wife has a cold. Not SOB.  No fevers.  He was checked for covid last week and that was neg.    No L ankle redness or bruising per patient report.  No trauma.    Meds and allergies reviewed.   ROS: Per HPI unless specifically indicated in ROS section   NAD Speech wnl  A/P:  Likely Achilles pain. Try heel wedge.  Can try icing for 5 minutes at a time.  Anatomy discussed with patient.  Rationale for using heel wedge discussed with patient.  He will update me as needed.  He'll f/u with vascular as planned.  The complaint above does not sound typical for claudication.  Supportive tx for cough in the meantime.  Reasonable to get repeat covid testing.  See notes on labs.

## 2019-07-01 LAB — NOVEL CORONAVIRUS, NAA: SARS-CoV-2, NAA: DETECTED — AB

## 2019-07-02 ENCOUNTER — Telehealth: Payer: Self-pay | Admitting: Cardiovascular Disease

## 2019-07-02 ENCOUNTER — Encounter: Payer: Self-pay | Admitting: Family Medicine

## 2019-07-02 ENCOUNTER — Telehealth: Payer: Self-pay

## 2019-07-02 DIAGNOSIS — M25579 Pain in unspecified ankle and joints of unspecified foot: Secondary | ICD-10-CM | POA: Insufficient documentation

## 2019-07-02 NOTE — Assessment & Plan Note (Signed)
Likely Achilles pain. Try heel wedge.  Can try icing for 5 minutes at a time.  Anatomy discussed with patient.  Rationale for using heel wedge discussed with patient.  He will update me as needed.  He'll f/u with vascular as planned.  The complaint above does not sound typical for claudication.  Supportive tx for cough in the meantime.  Reasonable to get repeat covid testing.  See notes on labs.

## 2019-07-02 NOTE — Telephone Encounter (Signed)
I need orders placed so I can schedule this patient's dopplers, please.

## 2019-07-02 NOTE — Telephone Encounter (Signed)
Spoke with pt wife who states pt tested positive for COVID-19 after he 'started feeling bad on Saturday afternoon'. She states pt was instructed to self-isolate for 10 days after positive result. She states she is also positive for COVID-19. Pt wife states pt not able to present for lab work due to results. She states that pt was advised against retest for COVID-19 because pt was asymptomatic at the time of test and was told it was not necessary. Advised that pt and wife isolate for 10-14 days and pt can present for lab work on the day of his f/u appt with Dr. Gwenlyn Found on 11/24 either before or after appt. Advised that she or pt call a few days in advance of appt and update office on pt status.   She requested that Dr. Gwenlyn Found call the pt to discuss the results of his 10/22 PV procedure. Advised her that this can be discussed during patient's appt 11/24 but will route to Dr. Gwenlyn Found per request to see if this is an option. Pt wife verbalized understanding

## 2019-07-02 NOTE — Telephone Encounter (Signed)
Noted.  Thanks.  Please add patient to the follow-up list.

## 2019-07-02 NOTE — Telephone Encounter (Signed)
Pt's wife called but gave phone to pt; pt notified 07/02/19 that he tested positive for covid; see result notes;today pt has dry cough that seems to be getting better, and has some fatigue. No travel or known exposure to covid. Pt's wife has tested positive for covid also. Pt has been self quarantining since 06/30/19. Ed precautions given and pt voiced understanding. Pt will cb if needed. FYI to Dr Damita Dunnings.

## 2019-07-02 NOTE — Telephone Encounter (Signed)
  Wife is calling because the patient is positive for covid and will not be able to do his lab work right now. She also would like to know if Dr Gwenlyn Found could call them to explain the procedure that the patient is having.

## 2019-07-03 NOTE — Telephone Encounter (Signed)
Done

## 2019-07-03 NOTE — Telephone Encounter (Signed)
Good morning.  Patient had dopplers scheduled on 07/10/19 that were cancelled because he was referred for surgery at the time of his angiogram.  I had Ryan Pittman review his chart and the patient does not need to have dopplers done prior to his visit

## 2019-07-07 MED ORDER — BENZONATATE 200 MG PO CAPS: 200.0000 mg | ORAL_CAPSULE | Freq: Three times a day (TID) | ORAL | 1 refills | Status: DC | PRN

## 2019-07-07 NOTE — Telephone Encounter (Addendum)
Mrs Starr [facility] signed) has been 10 days and all symptoms are better except the cough and Mrs Kundinger ask for med for cough. I spoke with Mr. Ouellette and pt continues with dry cough and pt has been taking OTC Robitussin; pt said cough is not too bad during the day but when lays down at night the cough makes it hard for pt to sleep. Pt slept in recliner last night and was able to sleep with head being elevated. Pt still has some SOB when moving around but is better than it was. No fever, no CP,H/A or dizziness. Pt request cb after reviewed by Dr Damita Dunnings. No temp is 97.8. ED precautions given. [facility].

## 2019-07-07 NOTE — Telephone Encounter (Signed)
Can discussed the procedure during the office visit on November 24

## 2019-07-07 NOTE — Telephone Encounter (Signed)
Would try taking tessalon, rx sent.  Update [place] as needed.  Glad he is feeling better.  Thanks.

## 2019-07-07 NOTE — Telephone Encounter (Signed)
Patient advised.

## 2019-07-07 NOTE — Addendum Note (Signed)
Addended by: Tonia Ghent on: 07/07/2019 10:26 AM   Modules accepted: Orders

## 2019-07-09 ENCOUNTER — Encounter (HOSPITAL_COMMUNITY): Payer: Self-pay | Admitting: Emergency Medicine

## 2019-07-09 ENCOUNTER — Inpatient Hospital Stay (HOSPITAL_COMMUNITY)
Admission: EM | Admit: 2019-07-09 | Discharge: 2019-07-19 | DRG: 177 | Disposition: A | Discharge status: Home / Self Care | Payer: Medicare Other | Attending: Internal Medicine | Admitting: Internal Medicine

## 2019-07-09 ENCOUNTER — Emergency Department (HOSPITAL_COMMUNITY): Payer: Medicare Other

## 2019-07-09 ENCOUNTER — Other Ambulatory Visit: Payer: Self-pay

## 2019-07-09 DIAGNOSIS — D472 Monoclonal gammopathy: Secondary | ICD-10-CM | POA: Diagnosis present

## 2019-07-09 DIAGNOSIS — I10 Essential (primary) hypertension: Secondary | ICD-10-CM | POA: Diagnosis present

## 2019-07-09 DIAGNOSIS — Z8701 Personal history of pneumonia (recurrent): Secondary | ICD-10-CM

## 2019-07-09 DIAGNOSIS — Z880 Allergy status to penicillin: Secondary | ICD-10-CM

## 2019-07-09 DIAGNOSIS — R7303 Prediabetes: Secondary | ICD-10-CM | POA: Diagnosis present

## 2019-07-09 DIAGNOSIS — I2510 Atherosclerotic heart disease of native coronary artery without angina pectoris: Secondary | ICD-10-CM | POA: Diagnosis present

## 2019-07-09 DIAGNOSIS — E890 Postprocedural hypothyroidism: Secondary | ICD-10-CM | POA: Diagnosis present

## 2019-07-09 DIAGNOSIS — Z833 Family history of diabetes mellitus: Secondary | ICD-10-CM

## 2019-07-09 DIAGNOSIS — J1289 Other viral pneumonia: Secondary | ICD-10-CM | POA: Diagnosis not present

## 2019-07-09 DIAGNOSIS — N1831 Chronic kidney disease, stage 3a: Secondary | ICD-10-CM | POA: Diagnosis not present

## 2019-07-09 DIAGNOSIS — R197 Diarrhea, unspecified: Secondary | ICD-10-CM | POA: Diagnosis present

## 2019-07-09 DIAGNOSIS — R0602 Shortness of breath: Secondary | ICD-10-CM

## 2019-07-09 DIAGNOSIS — M1990 Unspecified osteoarthritis, unspecified site: Secondary | ICD-10-CM | POA: Diagnosis present

## 2019-07-09 DIAGNOSIS — R05 Cough: Secondary | ICD-10-CM | POA: Diagnosis not present

## 2019-07-09 DIAGNOSIS — N179 Acute kidney failure, unspecified: Secondary | ICD-10-CM | POA: Diagnosis present

## 2019-07-09 DIAGNOSIS — Z886 Allergy status to analgesic agent status: Secondary | ICD-10-CM

## 2019-07-09 DIAGNOSIS — E871 Hypo-osmolality and hyponatremia: Secondary | ICD-10-CM | POA: Diagnosis present

## 2019-07-09 DIAGNOSIS — Z79890 Hormone replacement therapy (postmenopausal): Secondary | ICD-10-CM

## 2019-07-09 DIAGNOSIS — Z888 Allergy status to other drugs, medicaments and biological substances status: Secondary | ICD-10-CM

## 2019-07-09 DIAGNOSIS — I480 Paroxysmal atrial fibrillation: Secondary | ICD-10-CM | POA: Diagnosis present

## 2019-07-09 DIAGNOSIS — Z951 Presence of aortocoronary bypass graft: Secondary | ICD-10-CM

## 2019-07-09 DIAGNOSIS — E872 Acidosis: Secondary | ICD-10-CM | POA: Diagnosis not present

## 2019-07-09 DIAGNOSIS — K219 Gastro-esophageal reflux disease without esophagitis: Secondary | ICD-10-CM | POA: Diagnosis present

## 2019-07-09 DIAGNOSIS — U071 COVID-19: Secondary | ICD-10-CM | POA: Diagnosis not present

## 2019-07-09 DIAGNOSIS — D649 Anemia, unspecified: Secondary | ICD-10-CM | POA: Diagnosis not present

## 2019-07-09 DIAGNOSIS — J9601 Acute respiratory failure with hypoxia: Secondary | ICD-10-CM | POA: Diagnosis present

## 2019-07-09 DIAGNOSIS — Z79899 Other long term (current) drug therapy: Secondary | ICD-10-CM

## 2019-07-09 DIAGNOSIS — I739 Peripheral vascular disease, unspecified: Secondary | ICD-10-CM | POA: Diagnosis present

## 2019-07-09 DIAGNOSIS — N183 Chronic kidney disease, stage 3 unspecified: Secondary | ICD-10-CM | POA: Diagnosis present

## 2019-07-09 DIAGNOSIS — Z7902 Long term (current) use of antithrombotics/antiplatelets: Secondary | ICD-10-CM

## 2019-07-09 DIAGNOSIS — G2581 Restless legs syndrome: Secondary | ICD-10-CM | POA: Diagnosis present

## 2019-07-09 DIAGNOSIS — R079 Chest pain, unspecified: Secondary | ICD-10-CM | POA: Diagnosis not present

## 2019-07-09 DIAGNOSIS — E039 Hypothyroidism, unspecified: Secondary | ICD-10-CM | POA: Diagnosis present

## 2019-07-09 DIAGNOSIS — I129 Hypertensive chronic kidney disease with stage 1 through stage 4 chronic kidney disease, or unspecified chronic kidney disease: Secondary | ICD-10-CM | POA: Diagnosis present

## 2019-07-09 DIAGNOSIS — J1282 Pneumonia due to coronavirus disease 2019: Secondary | ICD-10-CM | POA: Diagnosis present

## 2019-07-09 DIAGNOSIS — E785 Hyperlipidemia, unspecified: Secondary | ICD-10-CM | POA: Diagnosis present

## 2019-07-09 LAB — COMPREHENSIVE METABOLIC PANEL
ALT: 34 U/L (ref 0–44)
AST: 37 U/L (ref 15–41)
Albumin: 2.6 g/dL — ABNORMAL LOW (ref 3.5–5.0)
Alkaline Phosphatase: 39 U/L (ref 38–126)
Anion gap: 12 (ref 5–15)
BUN: 33 mg/dL — ABNORMAL HIGH (ref 8–23)
CO2: 19 mmol/L — ABNORMAL LOW (ref 22–32)
Calcium: 8.4 mg/dL — ABNORMAL LOW (ref 8.9–10.3)
Chloride: 106 mmol/L (ref 98–111)
Creatinine, Ser: 1.91 mg/dL — ABNORMAL HIGH (ref 0.61–1.24)
GFR calc Af Amer: 38 mL/min — ABNORMAL LOW (ref >60)
GFR calc non Af Amer: 32 mL/min — ABNORMAL LOW (ref >60)
Glucose, Bld: 141 mg/dL — ABNORMAL HIGH (ref 70–99)
Potassium: 4.9 mmol/L (ref 3.5–5.1)
Sodium: 137 mmol/L (ref 135–145)
Total Bilirubin: 0.5 mg/dL (ref 0.3–1.2)
Total Protein: 6.1 g/dL — ABNORMAL LOW (ref 6.5–8.1)

## 2019-07-09 LAB — CBC WITH DIFFERENTIAL/PLATELET
Abs Immature Granulocytes: 0.14 10*3/uL — ABNORMAL HIGH (ref 0.00–0.07)
Basophils Absolute: 0 10*3/uL (ref 0.0–0.1)
Basophils Relative: 0 %
Eosinophils Absolute: 0 10*3/uL (ref 0.0–0.5)
Eosinophils Relative: 0 %
HCT: 29.2 % — ABNORMAL LOW (ref 39.0–52.0)
Hemoglobin: 9.1 g/dL — ABNORMAL LOW (ref 13.0–17.0)
Immature Granulocytes: 1 %
Lymphocytes Relative: 3 %
Lymphs Abs: 0.4 10*3/uL — ABNORMAL LOW (ref 0.7–4.0)
MCH: 29.7 pg (ref 26.0–34.0)
MCHC: 31.2 g/dL (ref 30.0–36.0)
MCV: 95.4 fL (ref 80.0–100.0)
Monocytes Absolute: 0.3 10*3/uL (ref 0.1–1.0)
Monocytes Relative: 2 %
Neutro Abs: 15.1 10*3/uL — ABNORMAL HIGH (ref 1.7–7.7)
Neutrophils Relative %: 94 %
Platelets: 246 10*3/uL (ref 150–400)
RBC: 3.06 MIL/uL — ABNORMAL LOW (ref 4.22–5.81)
RDW: 14.7 % (ref 11.5–15.5)
WBC: 16 10*3/uL — ABNORMAL HIGH (ref 4.0–10.5)
nRBC: 0 % (ref 0.0–0.2)

## 2019-07-09 MED ORDER — ALBUTEROL SULFATE HFA 108 (90 BASE) MCG/ACT IN AERS
8.0000 | INHALATION_SPRAY | Freq: Once | RESPIRATORY_TRACT | Status: AC
  Administered 2019-07-09: 23:00:00 8 via RESPIRATORY_TRACT
  Filled 2019-07-09: qty 6.7

## 2019-07-09 MED ORDER — PANTOPRAZOLE SODIUM 40 MG PO TBEC: 40.0000 mg | DELAYED_RELEASE_TABLET | Freq: Once | ORAL | Status: DC

## 2019-07-09 MED ORDER — PANTOPRAZOLE SODIUM 40 MG IV SOLR
40.0000 mg | Freq: Two times a day (BID) | INTRAVENOUS | Status: DC
  Administered 2019-07-09 – 2019-07-13 (×8): 40 mg via INTRAVENOUS
  Filled 2019-07-09 (×9): qty 40

## 2019-07-09 MED ORDER — ACETAMINOPHEN 325 MG PO TABS
650.0000 mg | ORAL_TABLET | Freq: Four times a day (QID) | ORAL | Status: DC | PRN
  Administered 2019-07-09: 23:00:00 650 mg via ORAL
  Filled 2019-07-09: qty 2

## 2019-07-09 MED ORDER — ACETAMINOPHEN 325 MG PO TABS
650.0000 mg | ORAL_TABLET | Freq: Once | ORAL | Status: AC
  Administered 2019-07-09: 20:00:00 650 mg via ORAL
  Filled 2019-07-09: qty 2

## 2019-07-09 NOTE — ED Notes (Signed)
#(  [phone]  **Patient's wife requesting to be contacted at number above once decision regarding admission/discharge is made. Wife is waiting in the car because her and patient live out of town.

## 2019-07-09 NOTE — Telephone Encounter (Signed)
Noted.  Agreed.  Thanks. 

## 2019-07-09 NOTE — ED Notes (Signed)
GranddaughterBenjie Karvonen [phone] would like an update

## 2019-07-09 NOTE — ED Notes (Signed)
Patient oxygen saturations maintained around 95%-96% while ambulating.

## 2019-07-09 NOTE — H&P (Signed)
History and Physical    Ryan Pittman [id number] DOB: May 13, 1941 DOA: 07/09/2019  PCP: Tonia Ghent, MD   Patient coming from: Home   Chief Complaint: Cough, loose stools    HPI: Ryan Pittman is a 78 y.o. male with medical history significant for coronary artery disease status-post CABG, HTN, hypothyroidism, and CKD III, diagnosed with COVID-19 on 10/26 and presenting with worsening cough.  Patient reports he is diagnosed with COVID-19 on 06/30/2019, and has had progressively worsening cough despite using Tessalon at home, denies any chest pain, does not feel very short of breath, and has not noted any lower extremity swelling or tenderness.  Patient reports loose stools for the past couple days without any abdominal pain, nausea, or vomiting.  Reports that the stool has been very dark but wonders if it is due to his use of Pepto-Bismol.  ED Course: Upon arrival to the ED, patient is found to be febrile to 39.3 C, saturating mid-90's on room air, mildly tachypneic, and with stable BP.  EKG features sinus rhythm and chest x-ray is notable for mild bilateral lower lobe infiltration.  Chemistry panel features creatinine 1.91, up from 1.18 last month.  CBC notable for leukocytosis to 16,000 and a normocytic anemia with hemoglobin 9.1, down from 10.5 last month.  Patient was treated with acetaminophen, albuterol, and Protonix in the ED.  Review of Systems:  All other systems reviewed and apart from HPI, are negative.  Past Medical History:  Diagnosis Date  . Anemia    due to GIB, s/p transfusion  . Anginal pain ([place])   . Arthritis    back pain, much worse after consecutive golf rounds  . Cancer ([place])    thyroid  . Chronic kidney disease   . Colon polyps   . Coronary artery disease   . Dyspnea    walking up a hill  . GERD (gastroesophageal reflux disease)   . Hypercalcemia    h/o, resolved as of 2012, prev due to high amount of calcium intake  . Hyperlipidemia   .  Hypertension   . IgG gammopathy    stable as of 2012 per [facility]  . Pneumonia    as a child  . PVD (peripheral vascular disease) ([place])    L leg bypass, R leg stented    Past Surgical History:  Procedure Laterality Date  .  dental implant left bottom-did have bleeding     . ABDOMINAL AORTOGRAM W/LOWER EXTREMITY Bilateral 06/26/2019   Procedure: ABDOMINAL AORTOGRAM W/LOWER EXTREMITY;  Surgeon: [name], MD;  Location: [facility];  Service: Cardiovascular;  Laterality: Bilateral;  . BYPASS GRAFT     L leg  . COLONOSCOPY WITH PROPOFOL N/A 05/04/2015   Procedure: COLONOSCOPY WITH PROPOFOL;  Surgeon: Lollie Sails, MD;  Location: [facility];  Service: Endoscopy;  Laterality: N/A;  . CORONARY ARTERY BYPASS GRAFT N/A 07/11/2018   Procedure: CORONARY ARTERY BYPASS GRAFTING (CABG) x three, using left internal mammary artery and right leg greater saphenous vein harvested endoscopically;  Surgeon: Gaye Pollack, MD;  Location: [place] OR;  Service: Open Heart Surgery;  Laterality: N/A;  . LEFT HEART CATH AND CORONARY ANGIOGRAPHY N/A 06/24/2018   Procedure: LEFT HEART CATH AND CORONARY ANGIOGRAPHY;  Surgeon: [name], MD;  Location: [facility];  Service: Cardiovascular;  Laterality: N/A;  . TEE WITHOUT CARDIOVERSION N/A 07/11/2018   Procedure: TRANSESOPHAGEAL ECHOCARDIOGRAM (TEE);  Surgeon: Gaye Pollack, MD;  Location: [place];  Service: Open Heart Surgery;  Laterality: N/A;  . THYROIDECTOMY, PARTIAL  2016     reports that he quit smoking about 10 years ago. His smoking use included cigarettes. He has quit using smokeless tobacco.  His smokeless tobacco use included chew. He reports previous alcohol use. He reports that he does not use drugs.  Allergies  Allergen Reactions  . Penicillins Itching and Other (See Comments)    PATIENT HAS HAD A PCN REACTION WITH IMMEDIATE RASH, FACIAL/TONGUE/THROAT SWELLING, SOB, OR LIGHTHEADEDNESS WITH HYPOTENSION:  #  #  YES  #  #   Has patient had a PCN reaction causing severe rash involving mucus membranes or skin necrosis: No Has patient had a PCN reaction that required hospitalization: No Has patient had a PCN reaction occurring within the last 10 years: No If all of the above answers are "NO", then may proceed with Cephalosporin use.   . Ace Inhibitors Cough  . Aspirin Other (See Comments)    H/o GI bleed  . Celebrex [Celecoxib] Other (See Comments)    GI bleed  . Lipitor [Atorvastatin] Other (See Comments)    myalgias    Family History  Problem Relation Age of Onset  . Diabetes Mother   . Stroke Father   . Colon cancer Neg Hx   . Prostate cancer Neg Hx      Prior to Admission medications   Medication Sig Start Date End Date Taking? Authorizing Provider  amiodarone (PACERONE) 200 MG tablet TAKE 1 TABLET BY MOUTH DAILY Patient taking differently: Take 200 mg by mouth daily.  02/24/19   [name], MD  amLODipine (NORVASC) 2.5 MG tablet TAKE ONE TABLET BY MOUTH EVERY DAY Patient taking differently: Take 2.5 mg by mouth daily. TAKE ONE TABLET BY MOUTH EVERY DAY 04/25/19   Tonia Ghent, MD  benzonatate (TESSALON) 200 MG capsule Take 1 capsule (200 mg total) by mouth 3 (three) times daily as needed for cough. 07/07/19   Tonia Ghent, MD  clopidogrel (PLAVIX) 75 MG tablet TAKE ONE TABLET EVERY DAY Patient taking differently: Take 75 mg by mouth at bedtime.  10/04/18   Tonia Ghent, MD  Co-Enzyme Q-10 100 MG CAPS Take 100 mg by mouth daily.     [provider]  Evolocumab (REPATHA SURECLICK) [id number] MG/ML SOAJ Inject 140 mg into the skin every 14 (fourteen) days. 02/17/19   [name], MD  levothyroxine (SYNTHROID, LEVOTHROID) 75 MCG tablet Take 75 mcg by mouth daily before breakfast. EXCEPT: Takes 112.5 mg  every Thursday per pt    [provider]  losartan (COZAAR) 100 MG tablet TAKE ONE TABLET EVERY DAY 05/20/19   Tonia Ghent, MD  metoprolol tartrate (LOPRESSOR) 25 MG  tablet Take 0.5 tablets (12.5 mg total) by mouth 2 (two) times daily. 09/05/18   [name], MD  Omega-3 Fatty Acids (FISH OIL OMEGA-3 PO) Take 1,400 Units by mouth daily.     [provider]  pravastatin (PRAVACHOL) 40 MG tablet TAKE ONE TABLET BY MOUTH EVERY DAY 06/10/19   Tonia Ghent, MD  tamsulosin (FLOMAX) 0.4 MG CAPS capsule Take 1 capsule (0.4 mg total) by mouth daily. 11/20/18   Stoioff, Ronda Fairly, MD  traZODone (DESYREL) 50 MG tablet Take 50 mg by mouth at bedtime as needed for sleep.    [provider]  vitamin C (ASCORBIC ACID) 500 MG tablet Take 500 mg by mouth daily.    [provider]  Physical Exam: Vitals:   07/09/19 1608 07/09/19 1920 07/09/19 2130 07/09/19 2324  BP: (!) 127/40 (!) 151/62 (!) 127/46 (!) 127/46  Pulse: 64 80 66 77  Resp: 16 20 (!) 22 20  Temp: 99.5 F (37.5 C) (!) 102.7 F (39.3 C)    TempSrc: Oral Oral    SpO2: 95% 95% 94% 98%    Constitutional: NAD, calm  Eyes: PERTLA, lids and conjunctivae normal ENMT: Mucous membranes are moist. Posterior pharynx clear of any exudate or lesions.   Neck: normal, supple, no masses, no thyromegaly Respiratory: no wheezing, no crackles. Mild tachypnea. Speaking full sentances. No accessory muscle use.  Cardiovascular: S1 & S2 heard, regular rate and rhythm. Mild lower leg swelling bilaterally.   Abdomen: No distension, no tenderness, soft. Bowel sounds active.  Musculoskeletal: no clubbing / cyanosis. No joint deformity upper and lower extremities.    Skin: no significant rashes, lesions, ulcers. Warm, dry, well-perfused. Neurologic: No facial asymmetry. Sensation intact. Moving all extremities.  Psychiatric: Alert and oriented to person, place, and situation. Very pleasant, cooperative.    Labs on Admission: I have personally reviewed following labs and imaging studies  CBC: Recent Labs  Lab 07/09/19 1616  WBC 16.0*  NEUTROABS 15.1*  HGB 9.1*  HCT 29.2*  MCV 95.4  PLT  0000000   Basic Metabolic Panel: Recent Labs  Lab 07/09/19 1616  NA 137  K 4.9  CL 106  CO2 19*  GLUCOSE 141*  BUN 33*  CREATININE 1.91*  CALCIUM 8.4*   GFR: Estimated Creatinine Clearance: 28.8 mL/min (A) (by C-G formula based on SCr of 1.91 mg/dL (H)). Liver Function Tests: Recent Labs  Lab 07/09/19 1616  AST 37  ALT 34  ALKPHOS 39  BILITOT 0.5  PROT 6.1*  ALBUMIN 2.6*   No results for input(s): LIPASE, AMYLASE in the last 168 hours. No results for input(s): AMMONIA in the last 168 hours. Coagulation Profile: No results for input(s): INR, PROTIME in the last 168 hours. Cardiac Enzymes: No results for input(s): CKTOTAL, CKMB, CKMBINDEX, TROPONINI in the last 168 hours. BNP (last 3 results) No results for input(s): PROBNP in the last 8760 hours. HbA1C: No results for input(s): HGBA1C in the last 72 hours. CBG: No results for input(s): GLUCAP in the last 168 hours. Lipid Profile: No results for input(s): CHOL, HDL, LDLCALC, TRIG, CHOLHDL, LDLDIRECT in the last 72 hours. Thyroid Function Tests: No results for input(s): TSH, T4TOTAL, FREET4, T3FREE, THYROIDAB in the last 72 hours. Anemia Panel: No results for input(s): VITAMINB12, FOLATE, FERRITIN, TIBC, IRON, RETICCTPCT in the last 72 hours. Urine analysis:    Component Value Date/Time   COLORURINE STRAW (A) 07/10/2018 1142   APPEARANCEUR CLEAR 07/10/2018 1142   LABSPEC 1.008 07/10/2018 1142   PHURINE 7.0 07/10/2018 1142   GLUCOSEU NEGATIVE 07/10/2018 1142   HGBUR NEGATIVE 07/10/2018 1142   BILIRUBINUR NEGATIVE 07/10/2018 1142   KETONESUR NEGATIVE 07/10/2018 1142   PROTEINUR NEGATIVE 07/10/2018 1142   NITRITE NEGATIVE 07/10/2018 1142   LEUKOCYTESUR NEGATIVE 07/10/2018 1142   Sepsis Labs: @LABRCNTIP (procalcitonin:4,lacticidven:4) ) Recent Results (from the past 240 hour(s))  Novel Coronavirus, NAA ([facility])     Status: Abnormal   Collection Time: 06/30/19 11:00 AM   Specimen: Nasopharyngeal(NP) swabs in  vial transport medium   NASOPHARYNGE  TESTING  Result Value Ref Range Status   SARS-CoV-2, NAA Detected (A) Not Detected Final    Comment: This nucleic acid amplification test was developed and its performance characteristics determined by [employer]. Nucleic  acid amplification tests include PCR and TMA. This test has not been FDA cleared or approved. This test has been authorized by FDA under an Emergency Use Authorization (EUA). This test is only authorized for the duration of time the declaration that circumstances exist justifying the authorization of the emergency use of in vitro diagnostic tests for detection of SARS-CoV-2 virus and/or diagnosis of COVID-19 infection under section 564(b)(1) of the Act, 21 U.S.C. PT:[id number]) (1), unless the authorization is terminated or revoked sooner. When diagnostic testing is negative, the possibility of a false negative result should be considered in the context of a patient's recent exposures and the presence of clinical signs and symptoms consistent with COVID-19. An individual without symptoms of COVID-19 and who is not shedding SARS-CoV-2 virus would  expect to have a negative (not detected) result in this assay.      Radiological Exams on Admission: Dg Chest Portable 1 View  Result Date: 07/09/2019 CLINICAL DATA:  Pt was tested postitive for Covid at [facility] about a week ago. He presents today because he states "I can't stop coughing". Cough is non productive, he denies fever, chest pains, only dyspnea after cough. EXAM: PORTABLE CHEST 1 VIEW COMPARISON:  Stable cardiomediastinal contours status post median sternotomy and CABG. Heart size is normal. FINDINGS: The heart size and mediastinal contours are within normal limits. Both lungs are clear. The visualized skeletal structures are unremarkable. There are mild infiltrates in the bilateral lower lobes suspicious for bronchopneumonia. No pneumothorax or large pleural  effusion. No acute finding in the visualized skeleton. IMPRESSION: Mild bilateral lower lobe infiltrates suspicious for bronchopneumonia. Recommend follow-up radiograph in 3-4 weeks to ensure resolution. Electronically Signed   By: Audie Pinto M.D.   On: 07/09/2019 18:32    EKG: Independently reviewed. Sinus rhythm, non-specific ST-T abnormality.   Assessment/Plan   1. Acute kidney injury superimposed on CKD III  - Presents with worsening cough after being diagnosed with COVID-19 a week ago, has had loose stools and decreased appetite, and is found to have SCr of 1.91, up from an apparent baseline of 1.2  - Hold losartan, renally-dose medications, provide gentle IVF hydration, repeat chem panel in am    2. COVID-19 pneumonia  - Presents with worsening cough after being diagnosed with COVID-19 on 10/26 (results in [place]) - He is febrile, mildly tachypneic, with positive CXR findings, not requiring supplemental O2  - There is leukocytosis, possible superimposed bacterial involvement  - Culture blood and sputum, check procalcitonin and consider antibiotics, check/trend inflammatory markers, continue supportive care, consider steroids if hypoxia develops    3. Normocytic anemia; MGUS; ?melena  - Hgb is 9.1 on admission, down from 10.5 three weeks earlier  - Patient reports dark stools recently but notes he is using pepto bismol and denies abdominal pain, indigestion, or nausea  - Hold Plavix initially, check FOBT, use IV Protonix q12h for now, trend H&H   4. CAD  - No anginal complaints  - Hold Plavix initially as above, continue beta-blocker and statin    5. Hypertension  - BP at goal  - Hold losartan in light of AKI, continue Lopressor and Norvasc as tolerated    6. Hypothyroidism  - Status-post thyroidectomy  - Continue Synthroid   7. Paroxysmal atrial fibrillation  - Patient had rapid atrial fibrillation while still in hospital after his CABG, has been in SR on amiodarone and  is not anticoagulated pta  - Continue amiodarone     PPE: CAPR, gown, gloves  DVT prophylaxis: SCD's  Code Status: Full  Family Communication: Discussed with patient  Consults called: None  Admission status: Observation     Vianne Bulls, MD [facility] Pager [phone]  If 7PM-7AM, please contact night-coverage [email or web address] Password [facility]  07/09/2019, 11:56 PM

## 2019-07-09 NOTE — ED Notes (Signed)
Ryan Pittman(Wife#[phone]) called/would like a call back on Pt's status/waiting outside in car.

## 2019-07-09 NOTE — ED Triage Notes (Signed)
Pt was tested postitive for Covid at [facility] about a week ago. He presents today because he states "I can't stop coughing". Cough is non productive, he denies fever, chest pains, only dyspnea after cough. Taking Tessalon [place] with no relief.

## 2019-07-09 NOTE — ED Provider Notes (Signed)
[facility] EMERGENCY DEPARTMENT Provider Note   CSN: [id number] Arrival date & time: 07/09/19  1541     History   Chief Complaint No chief complaint on file.   HPI Ryan Pittman is a 78 y.o. male.      Cough Cough characteristics:  Dry Sputum characteristics:  Nondescript Severity:  Severe Onset quality:  Gradual Duration:  7 days Timing:  Constant Progression:  Worsening Chronicity:  New Context comment:  Patient was diagnosed with COVID-19 and since that time he has had persistent, worsening cough refractory to treatment with Tessalon Perles Relieved by:  Nothing Worsened by:  Activity Ineffective treatments: Tessalon Perle. Associated symptoms: chills, fever, headaches and shortness of breath   Associated symptoms: no chest pain, no diaphoresis, no ear pain, no rash and no sore throat     Past Medical History:  Diagnosis Date  . Anemia    due to GIB, s/p transfusion  . Anginal pain ([place])   . Arthritis    back pain, much worse after consecutive golf rounds  . Cancer ([place])    thyroid  . Chronic kidney disease   . Colon polyps   . Coronary artery disease   . Dyspnea    walking up a hill  . GERD (gastroesophageal reflux disease)   . Hypercalcemia    h/o, resolved as of 2012, prev due to high amount of calcium intake  . Hyperlipidemia   . Hypertension   . IgG gammopathy    stable as of 2012 per [facility]  . Pneumonia    as a child  . PVD (peripheral vascular disease) ([facility])    L leg bypass, R leg stented    Patient Active Problem List   Diagnosis Date Noted  . Acute renal failure superimposed on stage 3 chronic kidney disease ([place]) 07/09/2019  . Pneumonia due to COVID-19 virus 07/09/2019  . Normocytic anemia 07/09/2019  . Ankle pain 07/02/2019  . Viral syndrome 11/06/2018  . CAD (coronary artery disease) 08/08/2018  . PAF (paroxysmal atrial fibrillation) ([place]) 08/06/2018  . Hx of CABG 07/11/2018  . Health care maintenance  04/04/2018  . Atypical chest pain 04/04/2018  . Hypothyroidism 03/30/2017  . Advance care planning 03/30/2017  . 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 Renal artery stenosis ([place]) 07/04/2016  . 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 Hyperglycemia 12/28/2015  . Hyperlipidemia 12/28/2015  . Thyroid cancer ([place]) 11/12/2014  . Shortness of breath 04/22/2014  . Spasm 04/22/2014  . Other cervical disc degeneration, unspecified cervical region 04/08/2014  . RLS (restless legs syndrome) 11/16/2013  . Insomnia 06/18/2013  . Medicare annual wellness visit, subsequent 03/12/2012  . HTN (hypertension) 08/14/2011  . BPH (benign prostatic hyperplasia) 04/28/2011  . CKD (chronic kidney disease) stage 3, GFR 30-59 ml/min 02/13/2011  . OA (osteoarthritis) 02/13/2011  . Carotid stenosis 02/13/2011  . ED (erectile dysfunction) 02/13/2011  . Duodenal ulcer 02/13/2011  . Diverticulosis 02/13/2011  . PVD (peripheral vascular disease) ([place]) 01/26/2011  . S/P bypass graft of extremity 01/26/2011  . Back pain 01/26/2011  . 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 Reduced libido 02/14/2010  . Nocturia 05/12/2008  . Urinary hesitancy 05/12/2008    Past Surgical History:  Procedure Laterality Date  .  dental implant left bottom-did have bleeding     . ABDOMINAL AORTOGRAM W/LOWER EXTREMITY Bilateral 06/26/2019   Procedure: ABDOMINAL AORTOGRAM W/LOWER EXTREMITY;  Surgeon: [name], MD;  Location: [facility] CV LAB;  Service: Cardiovascular;  Laterality: Bilateral;  .  BYPASS GRAFT     L leg  . COLONOSCOPY WITH PROPOFOL N/A 05/04/2015   Procedure: COLONOSCOPY WITH PROPOFOL;  Surgeon: Lollie Sails, MD;  Location: [facility] ENDOSCOPY;  Service: Endoscopy;  Laterality: N/A;  . CORONARY ARTERY BYPASS GRAFT N/A 07/11/2018   Procedure: CORONARY ARTERY BYPASS GRAFTING (CABG) x three, using left internal mammary artery and right leg greater saphenous vein harvested endoscopically;  Surgeon: Gaye Pollack,  MD;  Location: [facility] OR;  Service: Open Heart Surgery;  Laterality: N/A;  . LEFT HEART CATH AND CORONARY ANGIOGRAPHY N/A 06/24/2018   Procedure: LEFT HEART CATH AND CORONARY ANGIOGRAPHY;  Surgeon: [name], MD;  Location: [facility] CV LAB;  Service: Cardiovascular;  Laterality: N/A;  . TEE WITHOUT CARDIOVERSION N/A 07/11/2018   Procedure: TRANSESOPHAGEAL ECHOCARDIOGRAM (TEE);  Surgeon: Gaye Pollack, MD;  Location: [facility];  Service: Open Heart Surgery;  Laterality: N/A;  . THYROIDECTOMY, PARTIAL  2016        Home Medications    Prior to Admission medications   Medication Sig Start Date End Date Taking? Authorizing Provider  amiodarone (PACERONE) 200 MG tablet TAKE 1 TABLET BY MOUTH DAILY Patient taking differently: Take 200 mg by mouth daily.  02/24/19   [name], MD  amLODipine (NORVASC) 2.5 MG tablet TAKE ONE TABLET BY MOUTH EVERY DAY Patient taking differently: Take 2.5 mg by mouth daily. TAKE ONE TABLET BY MOUTH EVERY DAY 04/25/19   Tonia Ghent, MD  benzonatate (TESSALON) 200 MG capsule Take 1 capsule (200 mg total) by mouth 3 (three) times daily as needed for cough. 07/07/19   Tonia Ghent, MD  clopidogrel (PLAVIX) 75 MG tablet TAKE ONE TABLET EVERY DAY Patient taking differently: Take 75 mg by mouth at bedtime.  10/04/18   Tonia Ghent, MD  Co-Enzyme Q-10 100 MG CAPS Take 100 mg by mouth daily.     [provider]  Evolocumab (REPATHA SURECLICK) [id number] MG/ML SOAJ Inject 140 mg into the skin every 14 (fourteen) days. 02/17/19   [name], MD  levothyroxine (SYNTHROID, LEVOTHROID) 75 MCG tablet Take 75 mcg by mouth daily before breakfast. EXCEPT: Takes 112.5 mg  every Thursday per pt    [provider]  losartan (COZAAR) 100 MG tablet TAKE ONE TABLET EVERY DAY 05/20/19   Tonia Ghent, MD  metoprolol tartrate (LOPRESSOR) 25 MG tablet Take 0.5 tablets (12.5 mg total) by mouth 2 (two) times daily. 09/05/18   [name], MD  Omega-3  Fatty Acids (FISH OIL OMEGA-3 PO) Take 1,400 Units by mouth daily.     [provider]  pravastatin (PRAVACHOL) 40 MG tablet TAKE ONE TABLET BY MOUTH EVERY DAY 06/10/19   Tonia Ghent, MD  tamsulosin (FLOMAX) 0.4 MG CAPS capsule Take 1 capsule (0.4 mg total) by mouth daily. 11/20/18   Stoioff, Ronda Fairly, MD  traZODone (DESYREL) 50 MG tablet Take 50 mg by mouth at bedtime as needed for sleep.    [provider]  vitamin C (ASCORBIC ACID) 500 MG tablet Take 500 mg by mouth daily.    [provider]    Family History Family History  Problem Relation Age of Onset  . Diabetes Mother   . Stroke Father   . Colon cancer Neg Hx   . Prostate cancer Neg Hx     Social History Social History   Tobacco Use  . Smoking status: Former Smoker    Types: Cigarettes    Quit date:  09/04/2008    Years since quitting: 10.8  . Smokeless tobacco: Former Systems developer    Types: Chew  . Tobacco comment: HAs quit tobacco products 2009  Substance Use Topics  . Alcohol use: Not Currently    Alcohol/week: 0.0 standard drinks  . Drug use: No     Allergies   Penicillins, Ace inhibitors, Aspirin, Celebrex [celecoxib], and Lipitor [atorvastatin]   Review of Systems Review of Systems  Constitutional: Positive for chills, fatigue and fever. Negative for diaphoresis.  HENT: Negative for ear pain and sore throat.   Eyes: Negative for pain and visual disturbance.  Respiratory: Positive for cough and shortness of breath.   Cardiovascular: Negative for chest pain and palpitations.  Gastrointestinal: Positive for abdominal pain, blood in stool and diarrhea. Negative for abdominal distention and vomiting.  Genitourinary: Negative for dysuria and hematuria.  Musculoskeletal: Negative for arthralgias and back pain.  Skin: Negative for color change and rash.  Neurological: Positive for headaches. Negative for seizures and syncope.  All other systems reviewed and are negative.    Physical Exam  Updated Vital Signs BP (!) 127/46 (BP Location: Left Arm)   Pulse 77   Temp (!) 101.5 F (38.6 C) (Oral)   Resp 20   SpO2 98%   Physical Exam Vitals signs and nursing note reviewed.  Constitutional:      Appearance: He is well-developed. He is ill-appearing.  HENT:     Head: Normocephalic and atraumatic.  Eyes:     Conjunctiva/sclera: Conjunctivae normal.  Neck:     Musculoskeletal: Neck supple.  Cardiovascular:     Rate and Rhythm: Normal rate and regular rhythm.     Heart sounds: No murmur.  Pulmonary:     Breath sounds: Normal breath sounds.     Comments: Tachypnea at 25 breaths/min at the time of my initial evaluation. Abdominal:     Palpations: Abdomen is soft.     Tenderness: There is no abdominal tenderness.  Genitourinary:    Comments: I recommended patient undergo rectal exam for his recent history of dark stools however he declined.  Risks and benefits were discussed however patient deferred this exam. Skin:    General: Skin is warm and dry.     Capillary Refill: Capillary refill takes less than 2 seconds.  Neurological:     General: No focal deficit present.     Mental Status: He is alert.  Psychiatric:        Mood and Affect: Mood normal.      ED Treatments / Results  Labs (all labs ordered are listed, but only abnormal results are displayed) Labs Reviewed  CBC WITH DIFFERENTIAL/PLATELET - Abnormal; Notable for the following components:      Result Value   WBC 16.0 (*)    RBC 3.06 (*)    Hemoglobin 9.1 (*)    HCT 29.2 (*)    Neutro Abs 15.1 (*)    Lymphs Abs 0.4 (*)    Abs Immature Granulocytes 0.14 (*)    All other components within normal limits  COMPREHENSIVE METABOLIC PANEL - Abnormal; Notable for the following components:   CO2 19 (*)    Glucose, Bld 141 (*)    BUN 33 (*)    Creatinine, Ser 1.91 (*)    Calcium 8.4 (*)    Total Protein 6.1 (*)    Albumin 2.6 (*)    GFR calc non Af Amer 32 (*)    GFR calc Af Amer 38 (*)    All  other  components within normal limits  CULTURE, BLOOD (ROUTINE X 2)  CULTURE, BLOOD (ROUTINE X 2)  EXPECTORATED SPUTUM ASSESSMENT W REFEX TO RESP CULTURE  URINALYSIS, COMPLETE (UACMP) WITH MICROSCOPIC  SODIUM, URINE, RANDOM  CREATININE, URINE, RANDOM  PROCALCITONIN    EKG None  Radiology Dg Chest Portable 1 View  Result Date: 07/09/2019 CLINICAL DATA:  Pt was tested postitive for Covid at [facility] about a week ago. He presents today because he states "I can't stop coughing". Cough is non productive, he denies fever, chest pains, only dyspnea after cough. EXAM: PORTABLE CHEST 1 VIEW COMPARISON:  Stable cardiomediastinal contours status post median sternotomy and CABG. Heart size is normal. FINDINGS: The heart size and mediastinal contours are within normal limits. Both lungs are clear. The visualized skeletal structures are unremarkable. There are mild infiltrates in the bilateral lower lobes suspicious for bronchopneumonia. No pneumothorax or large pleural effusion. No acute finding in the visualized skeleton. IMPRESSION: Mild bilateral lower lobe infiltrates suspicious for bronchopneumonia. Recommend follow-up radiograph in 3-4 weeks to ensure resolution. Electronically Signed   By: Audie Pinto M.D.   On: 07/09/2019 18:32    Procedures Procedures (including critical care time)  Medications Ordered in ED Medications  acetaminophen (TYLENOL) tablet 650 mg (650 mg Oral Given 07/09/19 2316)  pantoprazole (PROTONIX) injection 40 mg (40 mg Intravenous Given 07/09/19 2357)  traZODone (DESYREL) tablet 50 mg (has no administration in time range)  0.9 %  sodium chloride infusion (has no administration in time range)  guaiFENesin-dextromethorphan (ROBITUSSIN DM) 100-10 MG/5ML syrup 10 mL (has no administration in time range)  albuterol (VENTOLIN HFA) 108 (90 Base) MCG/ACT inhaler 2 puff (has no administration in time range)  acetaminophen (TYLENOL) tablet 650 mg (has no administration in  time range)  HYDROcodone-acetaminophen (NORCO/VICODIN) 5-325 MG per tablet 1 tablet (has no administration in time range)  ondansetron (ZOFRAN) tablet 4 mg (has no administration in time range)    Or  ondansetron (ZOFRAN) injection 4 mg (has no administration in time range)  acetaminophen (TYLENOL) tablet 650 mg (650 mg Oral Given by Other 07/09/19 1932)  albuterol (VENTOLIN HFA) 108 (90 Base) MCG/ACT inhaler 8 puff (8 puffs Inhalation Given 07/09/19 2317)     Initial Impression / Assessment and Plan / ED Course  I have reviewed the triage vital signs and the nursing notes.  Pertinent labs & imaging results that were available during my care of the patient were reviewed by me and considered in my medical decision making (see chart for details).        Patient is an 78 year old male with history and physical exam as above presents emergency department for evaluation of persistent cough in the setting of COVID-19 which was diagnosed 1 week ago.  Patient is febrile here to 102.7 and tachypneic with respiratory rate in the low 20s.  He reports that he is also been having some dark stools and diarrhea for the past 3 days.  Patient has a history of GI bleeding.  Labs obtained in the emergency department demonstrated elevated creatinine at 1.91, hemoglobin that is down 1.4 g from most recent level at 9.1.  Chest x-ray demonstrates patchy bilateral infiltrates consistent with COVID-19 on ED physician interpretation.  No clear evidence of concomitant bacterial infection and patient has a dry, nonproductive cough currently.  I do not believe that he requires initiation of IV antibiotics at this time and this is likely related to his COVID-19.  Due to the dark stools with a  drop in his hemoglobin, diarrhea with AKI, as well as Covid with tachypnea patient will be admitted to internal medicine service for further work-up and management.  He was initiated on Protonix for GI bleed and type and screen was  obtained.  Patient awaiting admission at the time of my shift ending.  Care was overseen by my attending physician Dr. Sabra Heck.  Final Clinical Impressions(s) / ED Diagnoses   Final diagnoses:  COVID-19 virus infection    ED Discharge Orders    None       Romona Curls, MD 07/10/19 Alyson Ingles    Noemi Chapel, MD 07/10/19 1215

## 2019-07-09 NOTE — Telephone Encounter (Signed)
Mrs [facility]ooqi [facility] signed) left v/m that pts cough was getting worse; pt feeling congested in chest and fever at night not during the day. Wants different cough med instead of pills for cough. I spoke with pts wife and the pt. Non prod cough but pt feels chest congestion; pt had fever last night 101; pt took tylenol and fever went down and today temp has been 97.8 and 98. Today pt has had chills,pts hands are shaking; pt having dry coughing episodes more often today. Pt has SOB and weakness after coughing episodes.pt is not eating due to nausea and pt having watery diarrhea that is black. Pt did take pepto bismol last night but does not remember if stool was black prior to taking pepto bismol. Pt has mostly lost his taste. Pt has runny nose. Pt is still self quarantining. Pt and his wife advised pt should go to ED for eval and possible xray. Pt wanteed to know if could wait until tomorrow and I advised pt he should go today because after his coughing episodes he was very SOB and could here SOB when pt tried to talk. Mrs Calkin said she would either take pt to ED or call 911. FYI to Dr Damita Dunnings.

## 2019-07-10 ENCOUNTER — Inpatient Hospital Stay (HOSPITAL_COMMUNITY): Admission: RE | Admit: 2019-07-10 | Payer: Medicare Other | Source: Ambulatory Visit

## 2019-07-10 ENCOUNTER — Other Ambulatory Visit: Payer: Self-pay

## 2019-07-10 DIAGNOSIS — N1831 Chronic kidney disease, stage 3a: Secondary | ICD-10-CM

## 2019-07-10 DIAGNOSIS — Z79890 Hormone replacement therapy (postmenopausal): Secondary | ICD-10-CM | POA: Diagnosis not present

## 2019-07-10 DIAGNOSIS — K219 Gastro-esophageal reflux disease without esophagitis: Secondary | ICD-10-CM | POA: Diagnosis present

## 2019-07-10 DIAGNOSIS — E785 Hyperlipidemia, unspecified: Secondary | ICD-10-CM | POA: Diagnosis present

## 2019-07-10 DIAGNOSIS — N179 Acute kidney failure, unspecified: Secondary | ICD-10-CM

## 2019-07-10 DIAGNOSIS — M1990 Unspecified osteoarthritis, unspecified site: Secondary | ICD-10-CM | POA: Diagnosis present

## 2019-07-10 DIAGNOSIS — Z8701 Personal history of pneumonia (recurrent): Secondary | ICD-10-CM | POA: Diagnosis not present

## 2019-07-10 DIAGNOSIS — I2510 Atherosclerotic heart disease of native coronary artery without angina pectoris: Secondary | ICD-10-CM

## 2019-07-10 DIAGNOSIS — R279 Unspecified lack of coordination: Secondary | ICD-10-CM | POA: Diagnosis not present

## 2019-07-10 DIAGNOSIS — R0602 Shortness of breath: Secondary | ICD-10-CM | POA: Diagnosis not present

## 2019-07-10 DIAGNOSIS — Z7902 Long term (current) use of antithrombotics/antiplatelets: Secondary | ICD-10-CM | POA: Diagnosis not present

## 2019-07-10 DIAGNOSIS — J1289 Other viral pneumonia: Secondary | ICD-10-CM | POA: Diagnosis present

## 2019-07-10 DIAGNOSIS — Z743 Need for continuous supervision: Secondary | ICD-10-CM | POA: Diagnosis not present

## 2019-07-10 DIAGNOSIS — U071 COVID-19: Secondary | ICD-10-CM | POA: Diagnosis present

## 2019-07-10 DIAGNOSIS — D649 Anemia, unspecified: Secondary | ICD-10-CM | POA: Diagnosis present

## 2019-07-10 DIAGNOSIS — E871 Hypo-osmolality and hyponatremia: Secondary | ICD-10-CM | POA: Diagnosis present

## 2019-07-10 DIAGNOSIS — N183 Chronic kidney disease, stage 3 unspecified: Secondary | ICD-10-CM | POA: Diagnosis present

## 2019-07-10 DIAGNOSIS — R197 Diarrhea, unspecified: Secondary | ICD-10-CM | POA: Diagnosis present

## 2019-07-10 DIAGNOSIS — R0689 Other abnormalities of breathing: Secondary | ICD-10-CM | POA: Diagnosis not present

## 2019-07-10 DIAGNOSIS — I739 Peripheral vascular disease, unspecified: Secondary | ICD-10-CM | POA: Diagnosis present

## 2019-07-10 DIAGNOSIS — E890 Postprocedural hypothyroidism: Secondary | ICD-10-CM | POA: Diagnosis present

## 2019-07-10 DIAGNOSIS — Z79899 Other long term (current) drug therapy: Secondary | ICD-10-CM | POA: Diagnosis not present

## 2019-07-10 DIAGNOSIS — J9601 Acute respiratory failure with hypoxia: Secondary | ICD-10-CM | POA: Diagnosis present

## 2019-07-10 DIAGNOSIS — E872 Acidosis: Secondary | ICD-10-CM | POA: Diagnosis present

## 2019-07-10 DIAGNOSIS — D472 Monoclonal gammopathy: Secondary | ICD-10-CM | POA: Diagnosis present

## 2019-07-10 DIAGNOSIS — G2581 Restless legs syndrome: Secondary | ICD-10-CM | POA: Diagnosis present

## 2019-07-10 DIAGNOSIS — I129 Hypertensive chronic kidney disease with stage 1 through stage 4 chronic kidney disease, or unspecified chronic kidney disease: Secondary | ICD-10-CM | POA: Diagnosis present

## 2019-07-10 DIAGNOSIS — R7303 Prediabetes: Secondary | ICD-10-CM | POA: Diagnosis present

## 2019-07-10 DIAGNOSIS — R52 Pain, unspecified: Secondary | ICD-10-CM | POA: Diagnosis not present

## 2019-07-10 DIAGNOSIS — I10 Essential (primary) hypertension: Secondary | ICD-10-CM | POA: Diagnosis not present

## 2019-07-10 DIAGNOSIS — I480 Paroxysmal atrial fibrillation: Secondary | ICD-10-CM | POA: Diagnosis present

## 2019-07-10 LAB — URINALYSIS, COMPLETE (UACMP) WITH MICROSCOPIC
Bacteria, UA: NONE SEEN
Bilirubin Urine: NEGATIVE
Glucose, UA: NEGATIVE mg/dL
Hgb urine dipstick: NEGATIVE
Ketones, ur: NEGATIVE mg/dL
Leukocytes,Ua: NEGATIVE
Nitrite: NEGATIVE
Protein, ur: >=300 mg/dL — AB
Specific Gravity, Urine: 1.026 (ref 1.005–1.030)
pH: 5 (ref 5.0–8.0)

## 2019-07-10 LAB — CBC WITH DIFFERENTIAL/PLATELET
Abs Immature Granulocytes: 0.24 10*3/uL — ABNORMAL HIGH (ref 0.00–0.07)
Basophils Absolute: 0 10*3/uL (ref 0.0–0.1)
Basophils Relative: 0 %
Eosinophils Absolute: 0 10*3/uL (ref 0.0–0.5)
Eosinophils Relative: 0 %
HCT: 27.7 % — ABNORMAL LOW (ref 39.0–52.0)
Hemoglobin: 8.9 g/dL — ABNORMAL LOW (ref 13.0–17.0)
Immature Granulocytes: 1 %
Lymphocytes Relative: 3 %
Lymphs Abs: 0.6 10*3/uL — ABNORMAL LOW (ref 0.7–4.0)
MCH: 30.1 pg (ref 26.0–34.0)
MCHC: 32.1 g/dL (ref 30.0–36.0)
MCV: 93.6 fL (ref 80.0–100.0)
Monocytes Absolute: 0.4 10*3/uL (ref 0.1–1.0)
Monocytes Relative: 2 %
Neutro Abs: 17.6 10*3/uL — ABNORMAL HIGH (ref 1.7–7.7)
Neutrophils Relative %: 94 %
Platelets: 257 10*3/uL (ref 150–400)
RBC: 2.96 MIL/uL — ABNORMAL LOW (ref 4.22–5.81)
RDW: 14.7 % (ref 11.5–15.5)
WBC: 18.8 10*3/uL — ABNORMAL HIGH (ref 4.0–10.5)
nRBC: 0 % (ref 0.0–0.2)

## 2019-07-10 LAB — COMPREHENSIVE METABOLIC PANEL
ALT: 35 U/L (ref 0–44)
ALT: 35 U/L (ref 0–44)
AST: 42 U/L — ABNORMAL HIGH (ref 15–41)
AST: 48 U/L — ABNORMAL HIGH (ref 15–41)
Albumin: 2.8 g/dL — ABNORMAL LOW (ref 3.5–5.0)
Albumin: 2.6 g/dL — ABNORMAL LOW (ref 3.5–5.0)
Alkaline Phosphatase: 39 U/L (ref 38–126)
Alkaline Phosphatase: 41 U/L (ref 38–126)
Anion gap: 10 (ref 5–15)
Anion gap: 11 (ref 5–15)
BUN: 39 mg/dL — ABNORMAL HIGH (ref 8–23)
BUN: 43 mg/dL — ABNORMAL HIGH (ref 8–23)
CO2: 20 mmol/L — ABNORMAL LOW (ref 22–32)
CO2: 16 mmol/L — ABNORMAL LOW (ref 22–32)
Calcium: 8.4 mg/dL — ABNORMAL LOW (ref 8.9–10.3)
Calcium: 7.8 mg/dL — ABNORMAL LOW (ref 8.9–10.3)
Chloride: 108 mmol/L (ref 98–111)
Chloride: 110 mmol/L (ref 98–111)
Creatinine, Ser: 2.03 mg/dL — ABNORMAL HIGH (ref 0.61–1.24)
Creatinine, Ser: 1.6 mg/dL — ABNORMAL HIGH (ref 0.61–1.24)
GFR calc Af Amer: 35 mL/min — ABNORMAL LOW (ref >60)
GFR calc Af Amer: 46 mL/min — ABNORMAL LOW (ref >60)
GFR calc non Af Amer: 30 mL/min — ABNORMAL LOW (ref >60)
GFR calc non Af Amer: 40 mL/min — ABNORMAL LOW (ref >60)
Glucose, Bld: 133 mg/dL — ABNORMAL HIGH (ref 70–99)
Glucose, Bld: 189 mg/dL — ABNORMAL HIGH (ref 70–99)
Potassium: 4.4 mmol/L (ref 3.5–5.1)
Potassium: 4.7 mmol/L (ref 3.5–5.1)
Sodium: 138 mmol/L (ref 135–145)
Sodium: 137 mmol/L (ref 135–145)
Total Bilirubin: 0.6 mg/dL (ref 0.3–1.2)
Total Bilirubin: 0.9 mg/dL (ref 0.3–1.2)
Total Protein: 6.2 g/dL — ABNORMAL LOW (ref 6.5–8.1)
Total Protein: 5.5 g/dL — ABNORMAL LOW (ref 6.5–8.1)

## 2019-07-10 LAB — D-DIMER, QUANTITATIVE: D-Dimer, Quant: 0.87 ug/mL-FEU — ABNORMAL HIGH (ref 0.00–0.50)

## 2019-07-10 LAB — GLUCOSE, CAPILLARY
Glucose-Capillary: 156 mg/dL — ABNORMAL HIGH (ref 70–99)
Glucose-Capillary: 161 mg/dL — ABNORMAL HIGH (ref 70–99)

## 2019-07-10 LAB — HEMOGLOBIN AND HEMATOCRIT, BLOOD
HCT: 28.2 % — ABNORMAL LOW (ref 39.0–52.0)
Hemoglobin: 9 g/dL — ABNORMAL LOW (ref 13.0–17.0)

## 2019-07-10 LAB — MRSA PCR SCREENING: MRSA by PCR: NEGATIVE

## 2019-07-10 LAB — FERRITIN: Ferritin: 304 ng/mL (ref 24–336)

## 2019-07-10 LAB — SODIUM, URINE, RANDOM: Sodium, Ur: <10 mmol/L

## 2019-07-10 LAB — C-REACTIVE PROTEIN: CRP: 33.2 mg/dL — ABNORMAL HIGH (ref <1.0)

## 2019-07-10 LAB — CREATININE, URINE, RANDOM: Creatinine, Urine: 350.03 mg/dL

## 2019-07-10 LAB — PROCALCITONIN: Procalcitonin: 0.59 ng/mL

## 2019-07-10 LAB — PHOSPHORUS: Phosphorus: 3.7 mg/dL (ref 2.5–4.6)

## 2019-07-10 LAB — MAGNESIUM: Magnesium: 2.2 mg/dL (ref 1.7–2.4)

## 2019-07-10 MED ORDER — TAMSULOSIN HCL 0.4 MG PO CAPS
0.4000 mg | ORAL_CAPSULE | Freq: Every day | ORAL | Status: DC
  Administered 2019-07-10 – 2019-07-19 (×10): 0.4 mg via ORAL
  Filled 2019-07-10 (×10): qty 1

## 2019-07-10 MED ORDER — LEVOFLOXACIN IN D5W 750 MG/150ML IV SOLN
750.0000 mg | INTRAVENOUS | Status: AC
  Administered 2019-07-10 – 2019-07-14 (×3): 750 mg via INTRAVENOUS
  Filled 2019-07-10 (×5): qty 150

## 2019-07-10 MED ORDER — AMIODARONE HCL 100 MG PO TABS
200.0000 mg | ORAL_TABLET | Freq: Every day | ORAL | Status: DC
  Administered 2019-07-10: 200 mg via ORAL
  Filled 2019-07-10: qty 1

## 2019-07-10 MED ORDER — METOPROLOL TARTRATE 25 MG PO TABS
12.5000 mg | ORAL_TABLET | Freq: Two times a day (BID) | ORAL | Status: DC
  Administered 2019-07-10 – 2019-07-19 (×13): 12.5 mg via ORAL
  Filled 2019-07-10 (×18): qty 1

## 2019-07-10 MED ORDER — SODIUM CHLORIDE 0.9% FLUSH
3.0000 mL | Freq: Two times a day (BID) | INTRAVENOUS | Status: DC
  Administered 2019-07-10 – 2019-07-19 (×20): 3 mL via INTRAVENOUS

## 2019-07-10 MED ORDER — LORAZEPAM 1 MG PO TABS
0.0000 mg | ORAL_TABLET | Freq: Two times a day (BID) | ORAL | Status: AC
  Administered 2019-07-14: 1 mg via ORAL
  Filled 2019-07-10: qty 1

## 2019-07-10 MED ORDER — ONDANSETRON HCL 4 MG PO TABS: 4.0000 mg | ORAL_TABLET | Freq: Four times a day (QID) | ORAL | Status: DC | PRN

## 2019-07-10 MED ORDER — AMLODIPINE BESYLATE 5 MG PO TABS
2.5000 mg | ORAL_TABLET | Freq: Every day | ORAL | Status: DC
  Administered 2019-07-11 – 2019-07-19 (×9): 2.5 mg via ORAL
  Filled 2019-07-10 (×9): qty 1

## 2019-07-10 MED ORDER — LORAZEPAM 2 MG/ML IJ SOLN: 1.0000 mg | INTRAMUSCULAR | Status: DC | PRN

## 2019-07-10 MED ORDER — ADULT MULTIVITAMIN W/MINERALS CH
1.0000 | ORAL_TABLET | Freq: Every day | ORAL | Status: DC
  Administered 2019-07-10 – 2019-07-19 (×10): 1 via ORAL
  Filled 2019-07-10 (×11): qty 1

## 2019-07-10 MED ORDER — ADULT MULTIVITAMIN W/MINERALS CH: 1.0000 | ORAL_TABLET | Freq: Every day | ORAL | Status: DC

## 2019-07-10 MED ORDER — ACETAMINOPHEN 325 MG PO TABS
650.0000 mg | ORAL_TABLET | Freq: Four times a day (QID) | ORAL | Status: DC | PRN
  Administered 2019-07-10 – 2019-07-18 (×8): 650 mg via ORAL
  Filled 2019-07-10 (×8): qty 2

## 2019-07-10 MED ORDER — VITAMIN B-1 100 MG PO TABS
100.0000 mg | ORAL_TABLET | Freq: Every day | ORAL | Status: DC
  Administered 2019-07-10 – 2019-07-19 (×10): 100 mg via ORAL
  Filled 2019-07-10 (×11): qty 1

## 2019-07-10 MED ORDER — ONDANSETRON HCL 4 MG/2ML IJ SOLN: 4.0000 mg | Freq: Four times a day (QID) | INTRAMUSCULAR | Status: DC | PRN

## 2019-07-10 MED ORDER — AMIODARONE HCL 100 MG PO TABS: 200.0000 mg | ORAL_TABLET | Freq: Every day | ORAL | Status: DC

## 2019-07-10 MED ORDER — TRAZODONE HCL 50 MG PO TABS
50.0000 mg | ORAL_TABLET | Freq: Every evening | ORAL | Status: DC | PRN
  Administered 2019-07-11 – 2019-07-18 (×7): 50 mg via ORAL
  Filled 2019-07-10 (×7): qty 1

## 2019-07-10 MED ORDER — THIAMINE HCL 100 MG/ML IJ SOLN: 100.0000 mg | Freq: Every day | INTRAMUSCULAR | Status: DC

## 2019-07-10 MED ORDER — SODIUM CHLORIDE 0.9 % IV SOLN
INTRAVENOUS | Status: AC
  Administered 2019-07-10: 04:00:00 via INTRAVENOUS

## 2019-07-10 MED ORDER — GUAIFENESIN-DM 100-10 MG/5ML PO SYRP
10.0000 mL | ORAL_SOLUTION | ORAL | Status: DC | PRN
  Administered 2019-07-11 – 2019-07-13 (×7): 10 mL via ORAL
  Filled 2019-07-10 (×7): qty 10

## 2019-07-10 MED ORDER — AMLODIPINE BESYLATE 5 MG PO TABS
2.5000 mg | ORAL_TABLET | Freq: Every day | ORAL | Status: DC
  Administered 2019-07-10: 10:00:00 2.5 mg via ORAL
  Filled 2019-07-10: qty 1

## 2019-07-10 MED ORDER — FOLIC ACID 1 MG PO TABS: 1.0000 mg | ORAL_TABLET | Freq: Every day | ORAL | Status: DC

## 2019-07-10 MED ORDER — FOLIC ACID 1 MG PO TABS
1.0000 mg | ORAL_TABLET | Freq: Every day | ORAL | Status: DC
  Administered 2019-07-10 – 2019-07-19 (×10): 1 mg via ORAL
  Filled 2019-07-10 (×11): qty 1

## 2019-07-10 MED ORDER — HYDROCOD POLST-CPM POLST ER 10-8 MG/5ML PO SUER
5.0000 mL | Freq: Two times a day (BID) | ORAL | Status: DC | PRN
  Administered 2019-07-10 – 2019-07-19 (×10): 5 mL via ORAL
  Filled 2019-07-10 (×11): qty 5

## 2019-07-10 MED ORDER — SODIUM CHLORIDE 0.9 % IV SOLN
100.0000 mg | INTRAVENOUS | Status: AC
  Administered 2019-07-11 – 2019-07-14 (×3): 100 mg via INTRAVENOUS
  Filled 2019-07-10 (×4): qty 20

## 2019-07-10 MED ORDER — SODIUM CHLORIDE 0.9 % IV SOLN
200.0000 mg | Freq: Once | INTRAVENOUS | Status: AC
  Administered 2019-07-10: 18:00:00 200 mg via INTRAVENOUS
  Filled 2019-07-10: qty 40

## 2019-07-10 MED ORDER — LACTATED RINGERS IV SOLN
INTRAVENOUS | Status: AC
  Administered 2019-07-10 – 2019-07-12 (×4): via INTRAVENOUS

## 2019-07-10 MED ORDER — AMIODARONE HCL 100 MG PO TABS
200.0000 mg | ORAL_TABLET | Freq: Every day | ORAL | Status: DC
  Administered 2019-07-11 – 2019-07-19 (×9): 200 mg via ORAL
  Filled 2019-07-10 (×9): qty 2

## 2019-07-10 MED ORDER — VITAMIN B-1 100 MG PO TABS: 100.0000 mg | ORAL_TABLET | Freq: Every day | ORAL | Status: DC

## 2019-07-10 MED ORDER — HYDROCODONE-ACETAMINOPHEN 5-325 MG PO TABS: 1.0000 | ORAL_TABLET | ORAL | Status: DC | PRN

## 2019-07-10 MED ORDER — THIAMINE HCL 100 MG/ML IJ SOLN
100.0000 mg | Freq: Every day | INTRAMUSCULAR | Status: DC
  Filled 2019-07-10: qty 1
  Filled 2019-07-10 (×6): qty 2

## 2019-07-10 MED ORDER — LORAZEPAM 1 MG PO TABS
0.0000 mg | ORAL_TABLET | Freq: Four times a day (QID) | ORAL | Status: AC
  Administered 2019-07-10: 1 mg via ORAL
  Filled 2019-07-10: qty 1

## 2019-07-10 MED ORDER — ALBUTEROL SULFATE HFA 108 (90 BASE) MCG/ACT IN AERS
2.0000 | INHALATION_SPRAY | Freq: Four times a day (QID) | RESPIRATORY_TRACT | Status: DC | PRN
  Administered 2019-07-12 – 2019-07-19 (×2): 2 via RESPIRATORY_TRACT
  Filled 2019-07-10: qty 6.7

## 2019-07-10 MED ORDER — DEXAMETHASONE SODIUM PHOSPHATE 10 MG/ML IJ SOLN
6.0000 mg | INTRAMUSCULAR | Status: DC
  Administered 2019-07-10: 6 mg via INTRAVENOUS
  Filled 2019-07-10: qty 1

## 2019-07-10 MED ORDER — LEVOTHYROXINE SODIUM 75 MCG PO TABS
75.0000 ug | ORAL_TABLET | Freq: Every day | ORAL | Status: DC
  Administered 2019-07-10: 08:00:00 75 ug via ORAL
  Filled 2019-07-10: qty 1

## 2019-07-10 MED ORDER — PRAVASTATIN SODIUM 40 MG PO TABS
40.0000 mg | ORAL_TABLET | Freq: Every day | ORAL | Status: DC
  Administered 2019-07-10 – 2019-07-18 (×9): 40 mg via ORAL
  Filled 2019-07-10 (×10): qty 1

## 2019-07-10 MED ORDER — LEVOTHYROXINE SODIUM 75 MCG PO TABS
75.0000 ug | ORAL_TABLET | Freq: Every day | ORAL | Status: DC
  Administered 2019-07-11 – 2019-07-19 (×9): 75 ug via ORAL
  Filled 2019-07-10 (×9): qty 1

## 2019-07-10 MED ORDER — LORAZEPAM 1 MG PO TABS: 1.0000 mg | ORAL_TABLET | ORAL | Status: DC | PRN

## 2019-07-10 NOTE — Telephone Encounter (Addendum)
Mrs Sogge said that pt was admitted to hospital at [facility] due to covid and dehydration. Mrs Shangraw is crying because she does not know what is going on with pt and she wants to know if she will be able to stay with pt at the hospital. I spoke with Louie Casa at [facility] and he said pt is admitted to room 9160 at [facility] and pts phone # in room is [phone] and I spoke with Seth Bake at [facility] [phone] and she said I could transfer Mrs Stavropoulos to her and she would try and answer her questions. Mrs Westermann voiced understanding; Mrs Ramsour has stopped crying now and I gave Mrs Bodley pts room # [phone] and the direct phone # toMr Pettie's room [phone]. Done. FYI to Dr Damita Dunnings.

## 2019-07-10 NOTE — Telephone Encounter (Signed)
Please check with wife tomorrow.  I tried to call her at home and patient at the hospital to check on them both but I couldn't get through to either.  I did talk to the hospital clerk and asked her to pass a message along that I called about him.  I thank all involved.

## 2019-07-10 NOTE — ED Notes (Signed)
Attempted to call report x 2. No answer. 

## 2019-07-10 NOTE — Progress Notes (Signed)
Pharmacy Antibiotic Note  Ryan Pittman is a 78 y.o. male admitted on 07/09/2019 with COVID-19 pneumonia.  Pharmacy has been consulted for Remdesivir and Levaquin dosing. CRP 33.2, PCT 0.59, WBC 18.8, Tm 102.7  PCN allergy: immediate rash, facial/tongue/throat swelling, SOB, or lightheadedness with hypotension.  Unable to reach patient for further allergy clarification.  No history of PCN or Ceph use in [facility] records.  ALT: 35 CXR: Mild bilateral lower lobe infiltrates suspicious for bronchopneumonia SpO2: 90-94% on room air  Plan: Levaquin 750 mg IV q48h for 5 days. Remdesivir 200 mg once followed by 100 mg daily x 4 days.   Height: 5\' 7"  (170.2 cm) Weight: 150 lb (68 kg) IBW/kg (Calculated) : 66.1  Temp (24hrs), Avg:100.9 F (38.3 C), Min:99.2 F (37.3 C), Max:102.7 F (39.3 C)  Recent Labs  Lab 07/09/19 1616 07/10/19 0242  WBC 16.0* 18.8*  CREATININE 1.91* 2.03*    Estimated Creatinine Clearance: 27.1 mL/min (A) (by C-G formula based on SCr of 2.03 mg/dL (H)).    Allergies  Allergen Reactions  . Penicillins Itching and Other (See Comments)    PATIENT HAS HAD A PCN REACTION WITH IMMEDIATE RASH, FACIAL/TONGUE/THROAT SWELLING, SOB, OR LIGHTHEADEDNESS WITH HYPOTENSION:  #  #  YES  #  #  Has patient had a PCN reaction causing severe rash involving mucus membranes or skin necrosis: No Has patient had a PCN reaction that required hospitalization: No Has patient had a PCN reaction occurring within the last 10 years: No If all of the above answers are "NO", then may proceed with Cephalosporin use.   . Ace Inhibitors Cough  . Aspirin Other (See Comments)    H/o GI bleed  . Celebrex [Celecoxib] Other (See Comments)    GI bleed  . Lipitor [Atorvastatin] Other (See Comments)    myalgias    Antimicrobials this admission: 11/5 Remdesivir >> 11/9 11/5 Levaquin >> 11/9  Microbiology results: 11/5 BCx:   Thank you for allowing pharmacy to be a part of this patient's  care.  Gretta Arab PharmD, BCPS Clinical pharmacist phone 7am- 5pm: [phone] 07/10/2019 3:18 PM

## 2019-07-10 NOTE — ED Notes (Signed)
Attempted to call report. No answer.

## 2019-07-10 NOTE — ED Notes (Signed)
ED TO INPATIENT HANDOFF REPORT  ED Nurse Name and Phone #: [phone]   S Name/Age/Gender Ryan Pittman 78 y.o. male Room/Bed: 023C/023C  Code Status   Code Status: Prior  Home/SNF/Other Home Patient oriented to: self, place, time and situation Is this baseline? Yes   Triage Complete: Triage complete  Chief Complaint COVID positive cough  Triage Note Pt was tested postitive for Covid at [facility] about a week ago. He presents today because he states "I can't stop coughing". Cough is non productive, he denies fever, chest pains, only dyspnea after cough. Taking Tessalon Pearls with no relief.    Allergies Allergies  Allergen Reactions  . Penicillins Itching and Other (See Comments)    PATIENT HAS HAD A PCN REACTION WITH IMMEDIATE RASH, FACIAL/TONGUE/THROAT SWELLING, SOB, OR LIGHTHEADEDNESS WITH HYPOTENSION:  #  #  YES  #  #  Has patient had a PCN reaction causing severe rash involving mucus membranes or skin necrosis: No Has patient had a PCN reaction that required hospitalization: No Has patient had a PCN reaction occurring within the last 10 years: No If all of the above answers are "NO", then may proceed with Cephalosporin use.   . Ace Inhibitors Cough  . Aspirin Other (See Comments)    H/o GI bleed  . Celebrex [Celecoxib] Other (See Comments)    GI bleed  . Lipitor [Atorvastatin] Other (See Comments)    myalgias    Level of Care/Admitting Diagnosis ED Disposition    ED Disposition Condition [place] [facility] Area: [place] [[id number]]  Level of Care: Telemetry [5]  Covid Evaluation: Confirmed COVID Positive  [facility]: [id number]  Admitting Physician: Vianne Bulls [id number]  Attending Physician: Vianne Bulls [id number]  PT Class (Do Not Modify): Observation [104]  PT Acc Code (Do Not Modify): Observation [10022]       B Medical/Surgery History Past Medical History:  Diagnosis Date  .  Anemia    due to GIB, s/p transfusion  . Anginal pain ([place])   . Arthritis    back pain, much worse after consecutive golf rounds  . Cancer ([place])    thyroid  . Chronic kidney disease   . Colon polyps   . Coronary artery disease   . Dyspnea    walking up a hill  . GERD (gastroesophageal reflux disease)   . Hypercalcemia    h/o, resolved as of 2012, prev due to high amount of calcium intake  . Hyperlipidemia   . Hypertension   . IgG gammopathy    stable as of 2012 per [facility]  . Pneumonia    as a child  . PVD (peripheral vascular disease) ([place])    L leg bypass, R leg stented   Past Surgical History:  Procedure Laterality Date  .  dental implant left bottom-did have bleeding     . ABDOMINAL AORTOGRAM W/LOWER EXTREMITY Bilateral 06/26/2019   Procedure: ABDOMINAL AORTOGRAM W/LOWER EXTREMITY;  Surgeon: [name], MD;  Location: [place] [facility];  Service: Cardiovascular;  Laterality: Bilateral;  . BYPASS GRAFT     L leg  . COLONOSCOPY WITH PROPOFOL N/A 05/04/2015   Procedure: COLONOSCOPY WITH PROPOFOL;  Surgeon: Lollie Sails, MD;  Location: [facility] ENDOSCOPY;  Service: Endoscopy;  Laterality: N/A;  . CORONARY ARTERY BYPASS GRAFT N/A 07/11/2018   Procedure: CORONARY ARTERY BYPASS GRAFTING (CABG) x three, using left internal mammary artery and right leg greater saphenous vein harvested  endoscopically;  Surgeon: Gaye Pollack, MD;  Location: [facility] OR;  Service: Open Heart Surgery;  Laterality: N/A;  . LEFT HEART CATH AND CORONARY ANGIOGRAPHY N/A 06/24/2018   Procedure: LEFT HEART CATH AND CORONARY ANGIOGRAPHY;  Surgeon: [name], MD;  Location: [place] [facility];  Service: Cardiovascular;  Laterality: N/A;  . TEE WITHOUT CARDIOVERSION N/A 07/11/2018   Procedure: TRANSESOPHAGEAL ECHOCARDIOGRAM (TEE);  Surgeon: Gaye Pollack, MD;  Location: [place];  Service: Open Heart Surgery;  Laterality: N/A;  . THYROIDECTOMY, PARTIAL  2016     A IV Location/Drains/Wounds Patient  Lines/Drains/Airways Status   Active Line/Drains/Airways    Name:   Placement date:   Placement time:   Site:   Days:   Incision (Closed) 07/11/18 Chest Other (Comment)   07/11/18    0957     364   Incision (Closed) 07/11/18 Leg Right   07/11/18    0957     364          Intake/Output Last 24 hours No intake or output data in the 24 hours ending 07/10/19 0109  Labs/Imaging Results for orders placed or performed during the hospital encounter of 07/09/19 (from the past 48 hour(s))  CBC with Differential     Status: Abnormal   Collection Time: 07/09/19  4:16 PM  Result Value Ref Range   WBC 16.0 (H) 4.0 - 10.5 K/uL   RBC 3.06 (L) 4.22 - 5.81 MIL/uL   Hemoglobin 9.1 (L) 13.0 - 17.0 g/dL   HCT 29.2 (L) 39.0 - 52.0 %   MCV 95.4 80.0 - 100.0 fL   MCH 29.7 26.0 - 34.0 pg   MCHC 31.2 30.0 - 36.0 g/dL   RDW 14.7 11.5 - 15.5 %   Platelets 246 150 - 400 K/uL   nRBC 0.0 0.0 - 0.2 %   Neutrophils Relative % 94 %   Neutro Abs 15.1 (H) 1.7 - 7.7 K/uL   Lymphocytes Relative 3 %   Lymphs Abs 0.4 (L) 0.7 - 4.0 K/uL   Monocytes Relative 2 %   Monocytes Absolute 0.3 0.1 - 1.0 K/uL   Eosinophils Relative 0 %   Eosinophils Absolute 0.0 0.0 - 0.5 K/uL   Basophils Relative 0 %   Basophils Absolute 0.0 0.0 - 0.1 K/uL   Immature Granulocytes 1 %   Abs Immature Granulocytes 0.14 (H) 0.00 - 0.07 K/uL    Comment: Performed at [facility], [address] [place], [place] [address]  Comprehensive metabolic panel     Status: Abnormal   Collection Time: 07/09/19  4:16 PM  Result Value Ref Range   Sodium 137 135 - 145 mmol/L   Potassium 4.9 3.5 - 5.1 mmol/L   Chloride 106 98 - 111 mmol/L   CO2 19 (L) 22 - 32 mmol/L   Glucose, Bld 141 (H) 70 - 99 mg/dL   BUN 33 (H) 8 - 23 mg/dL   Creatinine, Ser 1.91 (H) 0.61 - 1.24 mg/dL   Calcium 8.4 (L) 8.9 - 10.3 mg/dL   Total Protein 6.1 (L) 6.5 - 8.1 g/dL   Albumin 2.6 (L) 3.5 - 5.0 g/dL   AST 37 15 - 41 U/L   ALT 34 0 - 44 U/L   Alkaline Phosphatase  39 38 - 126 U/L   Total Bilirubin 0.5 0.3 - 1.2 mg/dL   GFR calc non Af Amer 32 (L) >60 mL/min   GFR calc Af Amer 38 (L) >60 mL/min   Anion  gap 12 5 - 15    Comment: Performed at [facility], [place] [address] [place], [place] [address]   Dg Chest Portable 1 View  Result Date: 07/09/2019 CLINICAL DATA:  Pt was tested postitive for Covid at [facility] about a week ago. He presents today because he states "I can't stop coughing". Cough is non productive, he denies fever, chest pains, only dyspnea after cough. EXAM: PORTABLE CHEST 1 VIEW COMPARISON:  Stable cardiomediastinal contours status post median sternotomy and CABG. Heart size is normal. FINDINGS: The heart size and mediastinal contours are within normal limits. Both lungs are clear. The visualized skeletal structures are unremarkable. There are mild infiltrates in the bilateral lower lobes suspicious for bronchopneumonia. No pneumothorax or large pleural effusion. No acute finding in the visualized skeleton. IMPRESSION: Mild bilateral lower lobe infiltrates suspicious for bronchopneumonia. Recommend follow-up radiograph in 3-4 weeks to ensure resolution. Electronically Signed   By: Audie Pinto M.D.   On: 07/09/2019 18:32    Pending Labs Unresulted Labs (From admission, onward)    Start     Ordered   07/10/19 0014  Procalcitonin  Once,   STAT     07/10/19 0014   07/10/19 0014  Culture, blood (Routine X 2) w Reflex to ID Panel  BLOOD CULTURE X 2,   R (with STAT occurrences)     07/10/19 0014   07/10/19 0014  Culture, sputum-assessment  Once,   R     07/10/19 0014   07/09/19 2248  Sodium, urine, random  Once,   STAT     07/09/19 2247   07/09/19 2248  Creatinine, urine, random  Once,   STAT     07/09/19 2247   07/09/19 2247  Urinalysis, Complete w Microscopic  Once,   STAT     07/09/19 2247   Unscheduled  Occult blood card to lab, stool RN will collect  As needed,   R    Question:  Specimen to be collected by:  Answer:   RN will collect   07/10/19 0024   Signed and Held  ABO/Rh  Once,   R     Signed and Held   Signed and Held  CBC with Differential/Platelet  Daily,   R     Signed and Held   Signed and Held  Comprehensive metabolic panel  Daily,   R     Signed and Held   Signed and Held  C-reactive protein  Daily,   R     Signed and Held   Signed and Held  D-dimer, quantitative (not at [facility])  Daily,   R     Signed and Held   Signed and Held  Ferritin  Daily,   R     Signed and Held   Signed and Held  Magnesium  Daily,   R     Signed and Held   Signed and Held  Phosphorus  Daily,   R     Signed and Held          Vitals/Pain Today's Vitals   07/09/19 2130 07/09/19 2156 07/09/19 2324 07/09/19 2357  BP: (!) 127/46  (!) 127/46   Pulse: 66  77   Resp: (!) 22  20   Temp:    (!) 101.5 F (38.6 C)  TempSrc:    Oral  SpO2: 94%  98%   PainSc:  0-No pain      Isolation Precautions No active isolations  Medications Medications  pantoprazole (  PROTONIX) injection 40 mg (40 mg Intravenous Given 07/09/19 2357)  traZODone (DESYREL) tablet 50 mg (has no administration in time range)  0.9 %  sodium chloride infusion (has no administration in time range)  guaiFENesin-dextromethorphan (ROBITUSSIN DM) 100-10 MG/5ML syrup 10 mL (has no administration in time range)  albuterol (VENTOLIN HFA) 108 (90 Base) MCG/ACT inhaler 2 puff (has no administration in time range)  acetaminophen (TYLENOL) tablet 650 mg (has no administration in time range)  HYDROcodone-acetaminophen (NORCO/VICODIN) 5-325 MG per tablet 1 tablet (has no administration in time range)  ondansetron (ZOFRAN) tablet 4 mg (has no administration in time range)    Or  ondansetron (ZOFRAN) injection 4 mg (has no administration in time range)  acetaminophen (TYLENOL) tablet 650 mg (650 mg Oral Given by Other 07/09/19 1932)  albuterol (VENTOLIN HFA) 108 (90 Base) MCG/ACT inhaler 8 puff (8 puffs Inhalation Given 07/09/19 2317)    Mobility walks Low  fall risk   Focused Assessments Pulmonary Assessment Handoff:  Lung sounds:   O2 Device: Room Air        R Recommendations: See Admitting Provider Note  Report given to:   Additional Notes:

## 2019-07-10 NOTE — Progress Notes (Addendum)
Seen on arrival to [facility]. 78 yo with hx CABG, HTN, hypothyroidism, CKD presenting with cough and found to have COVID 19. C/o diarrhea on my evaluation, otherwise appeared comfortable Diarrhea apparently black, though pt and wife note he'd recently taken pepto bismol.  Febrile, normotensive, mildly tachypneic, normal hr  NAD Unlabored breathing RRR Abdomen s/nt/nd No lee  Labs notable for AKI with creatinine 2.03, mild NAGMA, mildly elevated AST CRP elevated, procalcitonin, and WBC elevated.  D dimer 0.87. UA without evidence of infection  Shirl [place]&P 1.Acute kidney injury superimposed on CKD III -Presented with increased cough with recent diagnosis of covid -Pt since noted increased loose stools with decreased PO intake with resultant Cr up to 1.9 from baseline of 1.2 -ARB on hold -Continuing with IVF hydration -Cr this AM up to 2.03 although pt reports feeling improved -Would continue IVF and repeat bmet in AM  2.COVID-19 pneumonia -Presents with worsening cough after being diagnosed with COVID-19 on 10/26 -Pt noted to be ttachypneic, with positive CXR findings, not requiring supplemental O2- though fluctuating, with some in lower 90's on RA - start remdesivir - with lack of hypoxia requiring O2, will hold off on additional steroids - given elevated procalcitonin, CRP, and WBC will start abx (unfortunately, pt with penicillin allergy, will use levaquin)  COVID-19 Labs  Recent Labs    07/10/19 0242  DDIMER 0.87*  FERRITIN 304  CRP 33.2*    Lab Results  Component Value Date   SARSCOV2NAA Detected ([place]) 06/30/2019   [place] NEGATIVE 06/23/2019   3.Normocytic anemia; MGUS; ?melena -Hgb is 9.1 on admission, down from 10.5 three weeks earlier - Hb stable, follow -Patient reports dark stools recently but notes he is using pepto bismol and denies abdominal pain, indigestion, or nausea -Holding Plavix at this time - Follow up on FOBT result. If negative, would  resume Plavix - Continue on IV Protonix q12h for now, trend H/H  4.CAD -Denies chest pain at this time -Given concerns of dark stools, holding Plavix per above, continue beta-blockerand statin -Would resume Plavix if stools are neg for blood  5.Hypertension -BP at goal -Holding losartan in light of AKI, continue Lopressor and Norvasc as tolerated  6.Hypothyroidism -Status-post thyroidectomy -Continue Synthroidas tolerated  7.Paroxysmal atrial fibrillation -Patient had rapid atrial fibrillation while still in hospital after his CABG, has been in SR on amiodarone and is not anticoagulated pta -Continueamiodarone as tolerated  Discussed POC  With pt wife.

## 2019-07-10 NOTE — Progress Notes (Signed)
PROGRESS NOTE    Ryan Pittman  [id number] DOB: 09-29-1940 DOA: 07/09/2019 PCP: Tonia Ghent, MD    Brief Narrative:  78 y.o. male with medical history significant for coronary artery disease status-post CABG, HTN, hypothyroidism, and CKD III, diagnosed with COVID-19 on 10/26 and presenting with worsening cough.  Patient reports he is diagnosed with COVID-19 on 06/30/2019, and has had progressively worsening cough despite using Tessalon at home, denies any chest pain, does not feel very short of breath, and has not noted any lower extremity swelling or tenderness.  Patient reports loose stools for the past couple days without any abdominal pain, nausea, or vomiting.  Reports that the stool has been very dark but wonders if it is due to his use of Pepto-Bismol.  ED Course: Upon arrival to the ED, patient is found to be febrile to 39.3 C, saturating mid-90's on room air, mildly tachypneic, and with stable BP.  EKG features sinus rhythm and chest x-ray is notable for mild bilateral lower lobe infiltration.  Chemistry panel features creatinine 1.91, up from 1.18 last month.  CBC notable for leukocytosis to 16,000 and a normocytic anemia with hemoglobin 9.1, down from 10.5 last month.  Patient was treated with acetaminophen, albuterol, and Protonix in the ED.  Assessment & Plan:   Principal Problem:   Acute renal failure superimposed on stage 3 chronic kidney disease (HCC) Active Problems:   HTN (hypertension)   Hypothyroidism   CAD (coronary artery disease)   Pneumonia due to COVID-19 virus   Normocytic anemia   COVID-19 virus infection   1. Acute kidney injury superimposed on CKD III  - Presented with increased cough with recent diagnosis of covid -Pt since noted increased loose stools with decreased PO intake with resultant Cr up to 1.9 from baseline of 1.2 -ARB on hold -Continuing with IVF hydration -Cr this AM up to 2.03 although pt reports feeling improved -Would  continue IVF and repeat bmet in AM  2. COVID-19 pneumonia  - Presents with worsening cough after being diagnosed with COVID-19 on 10/26 - Pt noted to be ttachypneic, with positive CXR findings, not requiring supplemental O2  - Will start pt on dexamethasone, as pt is not hypoxemic, am holding off on remdesivir  3. Normocytic anemia; MGUS; ?melena  - Hgb is 9.1 on admission, down from 10.5 three weeks earlier  - Patient reports dark stools recently but notes he is using pepto bismol and denies abdominal pain, indigestion, or nausea  - Holding Plavix at this time -Follow up on FOBT result. If negative, would resume Plavix -Continue on IV Protonix q12h for now, repeat cbc in AM  4. CAD  - Denies chest pain at this time - Given concerns of dark stools, holding Plavix per above, continue beta-blocker and statin  -Would resume Plavix if stools are neg for blood   5. Hypertension  - BP at goal  - Holding losartan in light of AKI, continue Lopressor and Norvasc as tolerated    6. Hypothyroidism  - Status-post thyroidectomy  - Continue Synthroid as tolerated  7. Paroxysmal atrial fibrillation  - Patient had rapid atrial fibrillation while still in hospital after his CABG, has been in SR on amiodarone and is not anticoagulated pta  - Continue amiodarone as tolerated  DVT prophylaxis: SCD's Code Status: Full Family Communication: Pt in room, family not at bedside Disposition Plan: Uncertain at this time  Consultants:     Procedures:     Antimicrobials: Anti-infectives (From admission,  onward)   None       Subjective: Reports feeling much better today  Objective: Vitals:   07/10/19 1000 07/10/19 1053 07/10/19 1100 07/10/19 1200  BP: (!) 145/47  (!) 146/54 (!) 158/55  Pulse: 73  63 73  Resp: (!) 24  17 (!) 30  Temp:  99.8 F (37.7 C)    TempSrc:  Oral    SpO2: 94%  94% 93%  Weight:      Height:        Intake/Output Summary (Last 24 hours) at 07/10/2019  1423 Last data filed at 07/10/2019 1000 Gross per 24 hour  Intake 582 ml  Output -  Net 582 ml   Filed Weights   07/10/19 0743  Weight: 68 kg    Examination: General exam: Appears calm and comfortable  Respiratory system: Clear to auscultation. Respiratory effort normal. Cardiovascular system: S1 & S2 heard, RRR Gastrointestinal system: Abdomen is nondistended, soft and nontender. No organomegaly or masses felt. Normal bowel sounds heard. Central nervous system: Alert and oriented. No focal neurological deficits. Extremities: Symmetric 5 x 5 power. Skin: No rashes, lesions Psychiatry: Judgement and insight appear normal. Mood & affect appropriate.   Data Reviewed: I have personally reviewed following labs and imaging studies  CBC: Recent Labs  Lab 07/09/19 1616 07/10/19 0242  WBC 16.0* 18.8*  NEUTROABS 15.1* 17.6*  HGB 9.1* 8.9*  HCT 29.2* 27.7*  MCV 95.4 93.6  PLT 246 [id number]   Basic Metabolic Panel: Recent Labs  Lab 07/09/19 1616 07/10/19 0242  NA 137 138  K 4.9 4.4  CL 106 108  CO2 19* 20*  GLUCOSE 141* 133*  BUN 33* 39*  CREATININE 1.91* 2.03*  CALCIUM 8.4* 8.4*  MG  --  2.2  PHOS  --  3.7   GFR: Estimated Creatinine Clearance: 27.1 mL/min (A) (by C-G formula based on SCr of 2.03 mg/dL (H)). Liver Function Tests: Recent Labs  Lab 07/09/19 1616 07/10/19 0242  AST 37 42*  ALT 34 35  ALKPHOS 39 39  BILITOT 0.5 0.6  PROT 6.1* 6.2*  ALBUMIN 2.6* 2.8*   No results for input(s): LIPASE, AMYLASE in the last 168 hours. No results for input(s): AMMONIA in the last 168 hours. Coagulation Profile: No results for input(s): INR, PROTIME in the last 168 hours. Cardiac Enzymes: No results for input(s): CKTOTAL, CKMB, CKMBINDEX, TROPONINI in the last 168 hours. BNP (last 3 results) No results for input(s): PROBNP in the last 8760 hours. HbA1C: No results for input(s): HGBA1C in the last 72 hours. CBG: No results for input(s): GLUCAP in the last 168 hours.  Lipid Profile: No results for input(s): CHOL, HDL, LDLCALC, TRIG, CHOLHDL, LDLDIRECT in the last 72 hours. Thyroid Function Tests: No results for input(s): TSH, T4TOTAL, FREET4, T3FREE, THYROIDAB in the last 72 hours. Anemia Panel: Recent Labs    07/10/19 0242  FERRITIN 304   Sepsis Labs: Recent Labs  Lab 07/10/19 0242  PROCALCITON 0.59    No results found for this or any previous visit (from the past 240 hour(s)).   Radiology Studies: Dg Chest Portable 1 View  Result Date: 07/09/2019 CLINICAL DATA:  Pt was tested postitive for Covid at [facility] about a week ago. He presents today because he states "I can't stop coughing". Cough is non productive, he denies fever, chest pains, only dyspnea after cough. EXAM: PORTABLE CHEST 1 VIEW COMPARISON:  Stable cardiomediastinal contours status post median sternotomy and CABG. Heart size is normal. FINDINGS: The heart  size and mediastinal contours are within normal limits. Both lungs are clear. The visualized skeletal structures are unremarkable. There are mild infiltrates in the bilateral lower lobes suspicious for bronchopneumonia. No pneumothorax or large pleural effusion. No acute finding in the visualized skeleton. IMPRESSION: Mild bilateral lower lobe infiltrates suspicious for bronchopneumonia. Recommend follow-up radiograph in 3-4 weeks to ensure resolution. Electronically Signed   By: Audie Pinto M.D.   On: 07/09/2019 18:32    Scheduled Meds: . amiodarone  200 mg Oral Daily  . amLODipine  2.5 mg Oral Daily  . levothyroxine  75 mcg Oral QAC breakfast  . metoprolol tartrate  12.5 mg Oral BID  . pantoprazole (PROTONIX) IV  40 mg Intravenous Q12H  . pravastatin  40 mg Oral q1800  . sodium chloride flush  3 mL Intravenous Q12H  . tamsulosin  0.4 mg Oral Daily   Continuous Infusions: . lactated ringers       LOS: 0 days   Marylu Lund, MD [facility] Pager On Amion  If 7PM-7AM, please contact night-coverage  07/10/2019, 2:23 PM

## 2019-07-10 NOTE — Progress Notes (Addendum)
patient triggering yellow on MEWS care of increased RR. This is after coughing spells. Patient on room air with sats of 93. PRN med given for coughing spells. Will continue to monitor. Patient also started on CIWA monitoring.

## 2019-07-10 NOTE — ED Notes (Signed)
Wife updated on phone. MD paged regarding wifes request for update

## 2019-07-10 NOTE — ED Notes (Signed)
Pt 1st on PTAR list for transport to [place]

## 2019-07-11 LAB — IRON AND TIBC
Iron: 20 ug/dL — ABNORMAL LOW (ref 45–182)
Saturation Ratios: 11 % — ABNORMAL LOW (ref 17.9–39.5)
TIBC: 189 ug/dL — ABNORMAL LOW (ref 250–450)
UIBC: 169 ug/dL

## 2019-07-11 LAB — COMPREHENSIVE METABOLIC PANEL
ALT: 32 U/L (ref 0–44)
AST: 46 U/L — ABNORMAL HIGH (ref 15–41)
Albumin: 2.6 g/dL — ABNORMAL LOW (ref 3.5–5.0)
Alkaline Phosphatase: 36 U/L — ABNORMAL LOW (ref 38–126)
Anion gap: 11 (ref 5–15)
BUN: 41 mg/dL — ABNORMAL HIGH (ref 8–23)
CO2: 20 mmol/L — ABNORMAL LOW (ref 22–32)
Calcium: 8.1 mg/dL — ABNORMAL LOW (ref 8.9–10.3)
Chloride: 107 mmol/L (ref 98–111)
Creatinine, Ser: 1.51 mg/dL — ABNORMAL HIGH (ref 0.61–1.24)
GFR calc Af Amer: 50 mL/min — ABNORMAL LOW (ref >60)
GFR calc non Af Amer: 43 mL/min — ABNORMAL LOW (ref >60)
Glucose, Bld: 153 mg/dL — ABNORMAL HIGH (ref 70–99)
Potassium: 5 mmol/L (ref 3.5–5.1)
Sodium: 138 mmol/L (ref 135–145)
Total Bilirubin: 0.7 mg/dL (ref 0.3–1.2)
Total Protein: 5.9 g/dL — ABNORMAL LOW (ref 6.5–8.1)

## 2019-07-11 LAB — CBC WITH DIFFERENTIAL/PLATELET
Abs Immature Granulocytes: 0.17 10*3/uL — ABNORMAL HIGH (ref 0.00–0.07)
Basophils Absolute: 0 10*3/uL (ref 0.0–0.1)
Basophils Relative: 0 %
Eosinophils Absolute: 0 10*3/uL (ref 0.0–0.5)
Eosinophils Relative: 0 %
HCT: 25.8 % — ABNORMAL LOW (ref 39.0–52.0)
Hemoglobin: 8.2 g/dL — ABNORMAL LOW (ref 13.0–17.0)
Immature Granulocytes: 1 %
Lymphocytes Relative: 3 %
Lymphs Abs: 0.5 10*3/uL — ABNORMAL LOW (ref 0.7–4.0)
MCH: 30 pg (ref 26.0–34.0)
MCHC: 31.8 g/dL (ref 30.0–36.0)
MCV: 94.5 fL (ref 80.0–100.0)
Monocytes Absolute: 0.2 10*3/uL (ref 0.1–1.0)
Monocytes Relative: 1 %
Neutro Abs: 15.2 10*3/uL — ABNORMAL HIGH (ref 1.7–7.7)
Neutrophils Relative %: 95 %
Platelets: 249 10*3/uL (ref 150–400)
RBC: 2.73 MIL/uL — ABNORMAL LOW (ref 4.22–5.81)
RDW: 15.1 % (ref 11.5–15.5)
WBC: 16.1 10*3/uL — ABNORMAL HIGH (ref 4.0–10.5)
nRBC: 0 % (ref 0.0–0.2)

## 2019-07-11 LAB — D-DIMER, QUANTITATIVE: D-Dimer, Quant: 0.98 ug/mL-FEU — ABNORMAL HIGH (ref 0.00–0.50)

## 2019-07-11 LAB — TYPE AND SCREEN
ABO/RH(D): A NEG
Antibody Screen: NEGATIVE

## 2019-07-11 LAB — FERRITIN: Ferritin: 365 ng/mL — ABNORMAL HIGH (ref 24–336)

## 2019-07-11 LAB — PHOSPHORUS: Phosphorus: 3.3 mg/dL (ref 2.5–4.6)

## 2019-07-11 LAB — HEMOGLOBIN AND HEMATOCRIT, BLOOD
HCT: 27.4 % — ABNORMAL LOW (ref 39.0–52.0)
Hemoglobin: 8.7 g/dL — ABNORMAL LOW (ref 13.0–17.0)

## 2019-07-11 LAB — VITAMIN B12: Vitamin B-12: 799 pg/mL (ref 180–914)

## 2019-07-11 LAB — PROCALCITONIN: Procalcitonin: 0.56 ng/mL

## 2019-07-11 LAB — C-REACTIVE PROTEIN: CRP: 35.2 mg/dL — ABNORMAL HIGH (ref <1.0)

## 2019-07-11 LAB — MAGNESIUM: Magnesium: 2.3 mg/dL (ref 1.7–2.4)

## 2019-07-11 LAB — FOLATE: Folate: 18.4 ng/mL (ref >5.9)

## 2019-07-11 MED ORDER — DEXAMETHASONE SODIUM PHOSPHATE 10 MG/ML IJ SOLN
6.0000 mg | Freq: Every day | INTRAMUSCULAR | Status: DC
  Administered 2019-07-11 – 2019-07-12 (×2): 6 mg via INTRAVENOUS
  Filled 2019-07-11 (×2): qty 1

## 2019-07-11 NOTE — Telephone Encounter (Addendum)
Wife says she spoke with the patient last night around 8:30 but was supposed to have gotten a call from the MD after rounds this morning at 7 AM but hasn't heard anything.  Wife does know that he has pneumonia.  I suggested that maybe she could call the nurses station to try to get an update from them.  She confirmed that she had that phone number and would call.

## 2019-07-11 NOTE — Telephone Encounter (Signed)
Received note from after hours [employer] that wife-[name] called back. Please call back at [phone]. Thank you

## 2019-07-11 NOTE — Progress Notes (Signed)
Spoke with patient's daughter and updated her to the pt's condition and progress. I then allowed the pt to speak directly with his daughter after confirming that she would update the pt's wife and that she had no other questions.

## 2019-07-11 NOTE — Progress Notes (Addendum)
PROGRESS NOTE    Ryan Pittman  [id number] DOB: [birth year] DOA: 07/09/2019 PCP: Tonia Ghent, MD   Brief Narrative:  78 y.o.malewith medical history significant forcoronary artery diseasestatus-post CABG, HTN, hypothyroidism, and CKD III, diagnosed with COVID-19 on 10/26 and presenting with worsening cough.Patient reports he is diagnosed with COVID-19 on 06/30/2019, and has had progressively worsening cough despite using Tessalon at home, denies any chest pain, does not feel very short of breath, and has not noted any lower extremity swelling or tenderness. Patient reports loose stools for the past couple days without any abdominal pain, nausea, or vomiting. Reports that the stool has been very dark but wonders if it is due to his use of Pepto-Bismol.  ED Course:Upon arrival to the ED, patient is found to be febrile to 39.3 C, saturating mid-90's on room air, mildly tachypneic, and with stable BP.EKG features sinus rhythm and chest x-ray is notable for mild bilateral lower lobe infiltration. Chemistry panel features creatinine 1.91, up from 1.18 last month. CBC notable for leukocytosis to 16,000 and Uchenna Rappaport normocytic anemia with hemoglobin 9.1, down from 10.5 last month. Patient was treated with acetaminophen, albuterol, and Protonix in the ED.  Assessment & Plan:   Principal Problem:   Acute renal failure superimposed on stage 3 chronic kidney disease (HCC) Active Problems:   HTN (hypertension)   Hypothyroidism   CAD (coronary artery disease)   Pneumonia due to COVID-19 virus   Normocytic anemia   COVID-19 virus infection  1.Acute kidney injury superimposed on CKD III -Presented with increased cough with recent diagnosis of covid -Pt since noted increased loose stools with decreased PO intake with resultant Cr up to 1.9 from baseline of 1.2 - improved to 1.51 today -ARB on hold -Continuing with IVF hydration  2.COVID-19 pneumonia Possible Bacterial Pneumonia   Diarrhea likely 2/2 COVID 19 Infection -Presents with worsening cough after being diagnosed with COVID-19 on 10/26 -Pt noted to be ttachypneic, with positive CXR findings, not requiring supplemental O2 - O2 trending lower today in the high 80's while I'm at bedside - Remdesivir (day 2) - dexamethasone - elevated CRP and procalcitonin and WBC suggest possible bacterial pneumonia - continue levaquin - Daily labs (CBC, CMP, CRP, d dimer, ferritin) - diarrhea has improved - CXR needs to be followed to resolution - rads recommended f/u in 3-4 weeks  COVID-19 Labs  Recent Labs    07/10/19 0242 07/11/19 0034  DDIMER 0.87* 0.98*  FERRITIN 304 365*  CRP 33.2* 35.2*    Lab Results  Component Value Date   SARSCOV2NAA Detected (Gabriell Casimir) 06/30/2019   [place] NEGATIVE 06/23/2019    3.Normocytic anemia; MGUS; ?melena -Hb down to 8.2 today - suspect in part dilutional - Still awaiting hemoccult, though lack of diarrhea/stools makes me less suspicious of active bleed at this time - possible that dark stools related to pepto bismol  - may need GI c/s pending hemoccult or if Hb continues trending down - iron panel, b12, folate - maintain active type and screen -HoldingPlavix at this time - Follow up onFOBT result. If negative, would resume Plavix - Continue onIV Protonix q12h for now,trend H/H  4.CAD -Denies chest pain at this time -Given concerns of dark stools, holdingPlavix per above, continue beta-blockerand statin - Would resume Plavix if stools are neg for blood  5.Hypertension -BP appropriate -Holdinglosartan in light of AKI, continue Lopressor and Norvasc as tolerated  6.Hypothyroidism -Status-post thyroidectomy -Continue Synthroidas tolerated  7.Paroxysmal atrial fibrillation -Patient had rapid atrial fibrillation while  still in hospital after his CABG, has been in [place] on amiodarone and is not anticoagulated pta  -Continueamiodaroneas tolerated  DVT prophylaxis: SCD Code Status: full  Family Communication:none at bedside -> called pt wife ~5:40.  She was upset that she hadn't heard anyone yesterday evening (I had spoken to her earlier in the day).  I discussed that I typically try to call in the afternoon after I've had Riverlyn Kizziah chance to see all pts.  I would certainly call earlier if change in status.  She expressed understanding and appropriate concern for her husband.  Discussed POC.  Disposition Plan: pending further improvement   Consultants:   none  Procedures:   none  Antimicrobials:  Anti-infectives (From admission, onward)   Start     Dose/Rate Route Frequency Ordered Stop   07/11/19 1600  remdesivir 100 mg in sodium chloride 0.9 % 250 mL IVPB     100 mg 500 mL/hr over 30 Minutes Intravenous Every 24 hours 07/10/19 1519 07/15/19 1559   07/10/19 1600  levofloxacin (LEVAQUIN) IVPB 750 mg     750 mg 100 mL/hr over 90 Minutes Intravenous Every 48 hours 07/10/19 1519     07/10/19 1600  remdesivir 200 mg in sodium chloride 0.9 % 250 mL IVPB     200 mg 500 mL/hr over 30 Minutes Intravenous Once 07/10/19 1519 07/10/19 1830     Subjective: No diarrhea today, last episode yesterday morning No CP, denies SOB, O2 sats 88% on RA at rest  Objective: Vitals:   07/11/19 0930 07/11/19 1000 07/11/19 1100 07/11/19 1300  BP: 134/61 (!) 137/56 (!) 120/50   Pulse: 65 61 60   Resp:  (!) 27 (!) 24   Temp:    98.4 F (36.9 C)  TempSrc:    Oral  SpO2:  93% 92%   Weight:      Height:        Intake/Output Summary (Last 24 hours) at 07/11/2019 1544 Last data filed at 07/11/2019 1300 Gross per 24 hour  Intake 3413.63 ml  Output 1075 ml  Net 2338.63 ml   Filed Weights   07/10/19 0743 07/11/19 0432  Weight: 68 kg 68.2 kg    Examination:  General exam: Appears calm and comfortable  Respiratory system: No increased WOB, comfortable Cardiovascular system: RRR Gastrointestinal system:  Abdomen is nondistended, soft and nontender.  Central nervous system: Alert and oriented. No focal neurological deficits. Extremities: no LEE Skin: No rashes, lesions or ulcers Psychiatry: Judgement and insight appear normal. Mood & affect appropriate.     Data Reviewed: I have personally reviewed following labs and imaging studies  CBC: Recent Labs  Lab 07/09/19 1616 07/10/19 0242 07/10/19 1426 07/11/19 0034  WBC 16.0* 18.8*  --  16.1*  NEUTROABS 15.1* 17.6*  --  15.2*  HGB 9.1* 8.9* 9.0* 8.2*  HCT 29.2* 27.7* 28.2* 25.8*  MCV 95.4 93.6  --  94.5  PLT 246 257  --  0000000   Basic Metabolic Panel: Recent Labs  Lab 07/09/19 1616 07/10/19 0242 07/10/19 1426 07/11/19 0034  NA 137 138 137 138  K 4.9 4.4 4.7 5.0  CL 106 108 110 107  CO2 19* 20* 16* 20*  GLUCOSE 141* 133* 189* 153*  BUN 33* 39* 43* 41*  CREATININE 1.91* 2.03* 1.60* 1.51*  CALCIUM 8.4* 8.4* 7.8* 8.1*  MG  --  2.2  --  2.3  PHOS  --  3.7  --  3.3   GFR: Estimated Creatinine Clearance: 36.5 mL/min (  Pike Sc[place]le[place]) (by C-G formula based on SCr of 1.51 mg/dL (H)). Liver Function Tests: Recent Labs  Lab 07/09/19 1616 07/10/19 0242 07/10/19 1426 07/11/19 0034  AST 37 42* 48* 46*  ALT 34 35 35 32  ALKPHOS 39 39 41 36*  BILITOT 0.5 0.6 0.9 0.7  PROT 6.1* 6.2* 5.5* 5.9*  ALBUMIN 2.6* 2.8* 2.6* 2.6*   No results for input(s): LIPASE, AMYLASE in the last 168 hours. No results for input(s): AMMONIA in the last 168 hours. Coagulation Profile: No results for input(s): INR, PROTIME in the last 168 hours. Cardiac Enzymes: No results for input(s): CKTOTAL, CKMB, CKMBINDEX, TROPONINI in the last 168 hours. BNP (last 3 results) No results for input(s): PROBNP in the last 8760 hours. HbA1C: No results for input(s): HGBA1C in the last 72 hours. CBG: Recent Labs  Lab 07/10/19 1547 07/10/19 2051  GLUCAP 156* 161*   Lipid Profile: No results for input(s): CHOL, HDL, LDLCALC, TRIG, CHOLHDL, LDLDIRECT in the last 72 hours.  Thyroid Function Tests: No results for input(s): TSH, T4TOTAL, FREET4, T3FREE, THYROIDAB in the last 72 hours. Anemia Panel: Recent Labs    07/10/19 0242 07/11/19 0034  FERRITIN 304 365*   Sepsis Labs: Recent Labs  Lab 07/10/19 0242 07/11/19 0848  PROCALCITON 0.59 0.56    Recent Results (from the past 240 hour(s))  Culture, blood (Routine X 2) w Reflex to ID Panel     Status: None (Preliminary result)   Collection Time: 07/10/19  2:43 AM   Specimen: BLOOD  Result Value Ref Range Status   Specimen Description BLOOD LEFT ANTECUBITAL  Final   Special Requests   Final    BOTTLES DRAWN AEROBIC AND ANAEROBIC Blood Culture results may not be optimal due to an inadequate volume of blood received in culture bottles   Culture   Final    NO GROWTH 1 DAY Performed at [facility], [place] [address] [place], [place] [address]    Report Status PENDING  Incomplete  Culture, blood (Routine X 2) w Reflex to ID Panel     Status: None (Preliminary result)   Collection Time: 07/10/19  2:44 AM   Specimen: BLOOD  Result Value Ref Range Status   Specimen Description BLOOD LEFT ANTECUBITAL  Final   Special Requests   Final    BOTTLES DRAWN AEROBIC AND ANAEROBIC Blood Culture results may not be optimal due to an inadequate volume of blood received in culture bottles   Culture   Final    NO GROWTH 1 DAY Performed at [facility], [place] [address], [place], [place] [address]    Report Status PENDING  Incomplete  MRSA PCR Screening     Status: None   Collection Time: 07/10/19  8:00 PM   Specimen: Nasopharyngeal  Result Value Ref Range Status   MRSA by PCR NEGATIVE NEGATIVE Final    Comment:        The GeneXpert MRSA Assay (FDA approved for NASAL specimens only), is one component of [facility] comprehensive MRSA colonization surveillance program. It is not intended to diagnose MRSA infection nor to guide or monitor treatment for MRSA infections. Performed at [facility], [place] [address], [place], [place] [address]          Radiology Studies: Dg Chest Portable 1 View  Result Date: 07/09/2019 CLINICAL DATA:  Pt was tested postitive [facility] Covid at [facility] about Margan Elias week ago. He presents today because he states "I can't stop coughing". Cough  is non productive, he denies fever, chest pains, only dyspnea after cough. EXAM: PORTABLE CHEST 1 VIEW COMPARISON:  Stable cardiomediastinal contours status post median sternotomy and CABG. Heart size is normal. FINDINGS: The heart size and mediastinal contours are within normal limits. Both lungs are clear. The visualized skeletal structures are unremarkable. There are mild infiltrates in the bilateral lower lobes suspicious for bronchopneumonia. No pneumothorax or large pleural effusion. No acute finding in the visualized skeleton. IMPRESSION: Mild bilateral lower lobe infiltrates suspicious for bronchopneumonia. Recommend follow-up radiograph in 3-4 weeks to ensure resolution. Electronically Signed   By: Audie Pinto M.D.   On: 07/09/2019 18:32        Scheduled Meds: . amiodarone  200 mg Oral Daily  . amLODipine  2.5 mg Oral Daily  . dexamethasone (DECADRON) injection  6 mg Intravenous Daily  . folic acid  1 mg Oral Daily  . levothyroxine  75 mcg Oral QAC breakfast  . LORazepam  0-4 mg Oral Q6H   Followed by  . [START ON 07/12/2019] LORazepam  0-4 mg Oral Q12H  . metoprolol tartrate  12.5 mg Oral BID  . multivitamin with minerals  1 tablet Oral Daily  . pantoprazole (PROTONIX) IV  40 mg Intravenous Q12H  . pravastatin  40 mg Oral q1800  . sodium chloride flush  3 mL Intravenous Q12H  . tamsulosin  0.4 mg Oral Daily  . thiamine  100 mg Oral Daily   Or  . thiamine  100 mg Intravenous Daily   Continuous Infusions: . lactated ringers 100 mL/hr at 07/11/19 0600  . levofloxacin (LEVAQUIN) IV 750 mg (07/10/19 1546)  . remdesivir 100 mg in NS 250 mL       LOS: 1 day    Time spent: over 30  min    Fayrene Helper, MD [facility] Pager AMION  If 7PM-7AM, please contact night-coverage [email or web address] Password [facility] 07/11/2019, 3:44 PM

## 2019-07-11 NOTE — Plan of Care (Signed)
  Problem: Education: Goal: Knowledge of risk factors and measures for prevention of condition will improve Outcome: Progressing   Problem: Coping: Goal: Psychosocial and spiritual needs will be supported Outcome: Progressing   Problem: Respiratory: Goal: Will maintain a patent airway Outcome: Progressing Goal: Complications related to the disease process, condition or treatment will be avoided or minimized Outcome: Progressing   Problem: Education: Goal: Knowledge of disease and its progression will improve Outcome: Progressing   Problem: Health Behavior/Discharge Planning: Goal: Ability to manage health-related needs will improve Outcome: Progressing   Problem: Clinical Measurements: Goal: Complications related to the disease process or treatment will be avoided or minimized Outcome: Progressing   Problem: Activity: Goal: Activity intolerance will improve Outcome: Progressing   Problem: Fluid Volume: Goal: Fluid volume balance will be maintained or improved Outcome: Progressing   Problem: Nutritional: Goal: Ability to make appropriate dietary choices will improve Outcome: Progressing   Problem: Respiratory: Goal: Respiratory symptoms related to disease process will be avoided Outcome: Progressing   Problem: Self-Concept: Goal: Body image disturbance will be avoided or minimized Outcome: Progressing   Problem: Urinary Elimination: Goal: Progression of disease will be identified and treated Outcome: Progressing

## 2019-07-11 NOTE — Telephone Encounter (Signed)
Noted. Thanks. I'll await the inpatient notes.

## 2019-07-12 ENCOUNTER — Inpatient Hospital Stay (HOSPITAL_COMMUNITY): Payer: Medicare Other

## 2019-07-12 LAB — COMPREHENSIVE METABOLIC PANEL
ALT: 37 U/L (ref 0–44)
AST: 44 U/L — ABNORMAL HIGH (ref 15–41)
Albumin: 2.3 g/dL — ABNORMAL LOW (ref 3.5–5.0)
Alkaline Phosphatase: 36 U/L — ABNORMAL LOW (ref 38–126)
Anion gap: 10 (ref 5–15)
BUN: 50 mg/dL — ABNORMAL HIGH (ref 8–23)
CO2: 19 mmol/L — ABNORMAL LOW (ref 22–32)
Calcium: 8.3 mg/dL — ABNORMAL LOW (ref 8.9–10.3)
Chloride: 109 mmol/L (ref 98–111)
Creatinine, Ser: 1.24 mg/dL (ref 0.61–1.24)
GFR calc Af Amer: >60 mL/min (ref >60)
GFR calc non Af Amer: 55 mL/min — ABNORMAL LOW (ref >60)
Glucose, Bld: 151 mg/dL — ABNORMAL HIGH (ref 70–99)
Potassium: 4.9 mmol/L (ref 3.5–5.1)
Sodium: 138 mmol/L (ref 135–145)
Total Bilirubin: 0.2 mg/dL — ABNORMAL LOW (ref 0.3–1.2)
Total Protein: 5.5 g/dL — ABNORMAL LOW (ref 6.5–8.1)

## 2019-07-12 LAB — PROCALCITONIN: Procalcitonin: 0.38 ng/mL

## 2019-07-12 LAB — CBC WITH DIFFERENTIAL/PLATELET
Abs Immature Granulocytes: 0.44 10*3/uL — ABNORMAL HIGH (ref 0.00–0.07)
Basophils Absolute: 0 10*3/uL (ref 0.0–0.1)
Basophils Relative: 0 %
Eosinophils Absolute: 0 10*3/uL (ref 0.0–0.5)
Eosinophils Relative: 0 %
HCT: 26.5 % — ABNORMAL LOW (ref 39.0–52.0)
Hemoglobin: 8.4 g/dL — ABNORMAL LOW (ref 13.0–17.0)
Immature Granulocytes: 2 %
Lymphocytes Relative: 3 %
Lymphs Abs: 0.6 10*3/uL — ABNORMAL LOW (ref 0.7–4.0)
MCH: 28.9 pg (ref 26.0–34.0)
MCHC: 31.7 g/dL (ref 30.0–36.0)
MCV: 91.1 fL (ref 80.0–100.0)
Monocytes Absolute: 0.5 10*3/uL (ref 0.1–1.0)
Monocytes Relative: 3 %
Neutro Abs: 16.7 10*3/uL — ABNORMAL HIGH (ref 1.7–7.7)
Neutrophils Relative %: 92 %
Platelets: 298 10*3/uL (ref 150–400)
RBC: 2.91 MIL/uL — ABNORMAL LOW (ref 4.22–5.81)
RDW: 14.8 % (ref 11.5–15.5)
WBC: 18.3 10*3/uL — ABNORMAL HIGH (ref 4.0–10.5)
nRBC: 0 % (ref 0.0–0.2)

## 2019-07-12 LAB — MAGNESIUM: Magnesium: 2 mg/dL (ref 1.7–2.4)

## 2019-07-12 LAB — PHOSPHORUS: Phosphorus: 4 mg/dL (ref 2.5–4.6)

## 2019-07-12 LAB — D-DIMER, QUANTITATIVE: D-Dimer, Quant: 1.53 ug/mL-FEU — ABNORMAL HIGH (ref 0.00–0.50)

## 2019-07-12 LAB — FERRITIN: Ferritin: 410 ng/mL — ABNORMAL HIGH (ref 24–336)

## 2019-07-12 LAB — OCCULT BLOOD X 1 CARD TO LAB, STOOL: Fecal Occult Bld: NEGATIVE

## 2019-07-12 LAB — ABO/RH: ABO/RH(D): A NEG

## 2019-07-12 LAB — C-REACTIVE PROTEIN: CRP: 19.2 mg/dL — ABNORMAL HIGH (ref <1.0)

## 2019-07-12 MED ORDER — CLOPIDOGREL BISULFATE 75 MG PO TABS: 75.0000 mg | ORAL_TABLET | Freq: Every day | ORAL | Status: DC

## 2019-07-12 MED ORDER — LACTATED RINGERS IV SOLN
INTRAVENOUS | Status: DC
  Administered 2019-07-12: 19:00:00 via INTRAVENOUS

## 2019-07-12 NOTE — Progress Notes (Signed)
PROGRESS NOTE    Ryan Pittman  [id number] DOB: 03/20/1941 DOA: 07/09/2019 PCP: Tonia Ghent, MD   Brief Narrative:  78 y.o.malewith medical history significant forcoronary artery diseasestatus-post CABG, HTN, hypothyroidism, and CKD III, diagnosed with COVID-19 on 10/26 and presenting with worsening cough.Patient reports he is diagnosed with COVID-19 on 06/30/2019, and has had progressively worsening cough despite using Tessalon at home, denies any chest pain, does not feel very short of breath, and has not noted any lower extremity swelling or tenderness. Patient reports loose stools for the past couple days without any abdominal pain, nausea, or vomiting. Reports that the stool has been very dark but wonders if it is due to his use of Pepto-Bismol.  ED Course:Upon arrival to the ED, patient is found to be febrile to 39.3 C, saturating mid-90's on room air, mildly tachypneic, and with stable BP.EKG features sinus rhythm and chest x-ray is notable for mild bilateral lower lobe infiltration. Chemistry panel features creatinine 1.91, up from 1.18 last month. CBC notable for leukocytosis to 16,000 and Dominique Calvey normocytic anemia with hemoglobin 9.1, down from 10.5 last month. Patient was treated with acetaminophen, albuterol, and Protonix in the ED.  Assessment & Plan:   Principal Problem:   Acute renal failure superimposed on stage 3 chronic kidney disease (HCC) Active Problems:   HTN (hypertension)   Hypothyroidism   CAD (coronary artery disease)   Pneumonia due to COVID-19 virus   Normocytic anemia   COVID-19 virus infection  1.Acute kidney injury superimposed on CKD III -Presented with increased cough with recent diagnosis of covid -Pt since noted increased loose stools with decreased PO intake with resultant Cr up to 1.9 from baseline of 1.2 - improved to 1.24 today -ARB on hold -Continuing with IVF hydration  2.COVID-19 pneumonia Possible Bacterial Pneumonia   Diarrhea likely 2/2 COVID 19 Infection -Presents with worsening cough after being diagnosed with COVID-19 on 10/26 -Pt noted to be ttachypneic, with positive CXR findings, not requiring supplemental O2 - O2 needs increased today -> became hypoxic while taking Shandrika Ambers sponge bath per nursing -> placed on 6 L -> I was able to wean to 2 L - given worsening O2 requirement, I discussed with pt possibility of convalescent plasma if things were to worsen again.  We discussed the intervention and consent.  He has consent at his bedside.  As I was able to wean to 2 L, I don't think we need this now and he also wasn't sure about it yet.  His wife wants to know if we think this is necessary before we would do any intervention like this. - Remdesivir (day 3) - dexamethasone - elevated CRP and procalcitonin and WBC suggest possible bacterial pneumonia - continue levaquin  - Daily labs (CBC, CMP, CRP, d dimer, ferritin) - improving, d dimer up slightly - diarrhea has improved - CXR with bilateral consolidative opacities - rads recommended f/u in 3-4 weeks  COVID-19 Labs  Recent Labs    07/10/19 0242 07/11/19 0034 07/12/19 0451  DDIMER 0.87* 0.98* 1.53*  FERRITIN 304 365* 410*  CRP 33.2* 35.2* 19.2*    Lab Results  Component Value Date   SARSCOV2NAA Detected (Cristella Stiver) 06/30/2019   [place] NEGATIVE 06/23/2019    3.Normocytic anemia; MGUS; ?melena -Hb down to 8.2 today - suspect in part dilutional - Still awaiting hemoccult, though lack of diarrhea/stools makes me less suspicious of active bleed at this time - possible that dark stools related to pepto bismol  - no BM  to collect  - may need GI c/s pending hemoccult or if Hb continues trending down - iron panel, b12, folate -> iron deficiency anemia - consider iron infusion prior to d/c -> needs outpatient workup for this (consider inpatient GI c/s if positive guiac) - maintain active type and screen -HoldingPlavix at this time - Follow up  onFOBT result (pending collection). Will perform rectal for hemoccult prior to starting DVT ppx or plavix. - Continue onIV Protonix q12h for now,trend H/H  4.CAD -Denies chest pain at this time -Given concerns of dark stools, holdingPlavix per above, continue beta-blockerand statin - Would resume Plavix if stools are neg for blood  5.Hypertension -BP appropriate -Holdinglosartan in light of AKI, continue Lopressor and Norvasc as tolerated(some bradycardia, continue to monitor, holding parameters for metop)  6.Hypothyroidism -Status-post thyroidectomy -Continue Synthroidas tolerated  7.Paroxysmal atrial fibrillation -Patient had rapid atrial fibrillation while still in hospital after his CABG, has been in SR on amiodarone and is not anticoagulated pta -Continueamiodaroneas tolerated  DVT prophylaxis: SCD Code Status: full  Family Communication: discussed with wife 11/7 Disposition Plan: pending further improvement   Consultants:   none  Procedures:   none  Antimicrobials:  Anti-infectives (From admission, onward)   Start     Dose/Rate Route Frequency Ordered Stop   07/11/19 1600  remdesivir 100 mg in sodium chloride 0.9 % 250 mL IVPB     100 mg 500 mL/hr over 30 Minutes Intravenous Every 24 hours 07/10/19 1519 07/15/19 1559   07/10/19 1600  levofloxacin (LEVAQUIN) IVPB 750 mg     750 mg 100 mL/hr over 90 Minutes Intravenous Every 48 hours 07/10/19 1519     07/10/19 1600  remdesivir 200 mg in sodium chloride 0.9 % 250 mL IVPB     200 mg 500 mL/hr over 30 Minutes Intravenous Once 07/10/19 1519 07/10/19 1830     Subjective: Feels comfortable SOB is ok  Objective: Vitals:   07/12/19 1236 07/12/19 1247 07/12/19 1250 07/12/19 1531  BP:    139/64  Pulse: (!) 58 66 61 (!) 54  Resp: 19 (!) 24 16 18   Temp:    97.7 F (36.5 C)  TempSrc:    Oral  SpO2: 92% (!) 86% (!) 87% 90%  Weight:      Height:        Intake/Output Summary  (Last 24 hours) at 07/12/2019 1538 Last data filed at 07/12/2019 1000 Gross per 24 hour  Intake 1956.03 ml  Output 1325 ml  Net 631.03 ml   Filed Weights   07/10/19 0743 07/11/19 0432 07/12/19 0500  Weight: 68 kg 68.2 kg 68 kg    Examination:  General: No acute distress. Cardiovascular: RRR Lungs: unlabored, comfortable Abdomen: Soft, nontender, nondistended Neurological: Alert and oriented 3. Moves all extremities 4. Cranial nerves II through XII grossly intact. Skin: Warm and dry. No rashes or lesions. Extremities: No clubbing or cyanosis. No edema.   Data Reviewed: I have personally reviewed following labs and imaging studies  CBC: Recent Labs  Lab 07/09/19 1616 07/10/19 0242 07/10/19 1426 07/11/19 0034 07/11/19 1416 07/12/19 0451  WBC 16.0* 18.8*  --  16.1*  --  18.3*  NEUTROABS 15.1* 17.6*  --  15.2*  --  16.7*  HGB 9.1* 8.9* 9.0* 8.2* 8.7* 8.4*  HCT 29.2* 27.7* 28.2* 25.8* 27.4* 26.5*  MCV 95.4 93.6  --  94.5  --  91.1  PLT 246 257  --  249  --  [id number]   Basic Metabolic Panel: Recent  Labs  Lab 07/09/19 1616 07/10/19 0242 07/10/19 1426 07/11/19 0034 07/12/19 0451  NA 137 138 137 138 138  K 4.9 4.4 4.7 5.0 4.9  CL 106 108 110 107 109  CO2 19* 20* 16* 20* 19*  GLUCOSE 141* 133* 189* 153* 151*  BUN 33* 39* 43* 41* 50*  CREATININE 1.91* 2.03* 1.60* 1.51* 1.24  CALCIUM 8.4* 8.4* 7.8* 8.1* 8.3*  MG  --  2.2  --  2.3 2.0  PHOS  --  3.7  --  3.3 4.0   GFR: Estimated Creatinine Clearance: 44.4 mL/min (by C-G formula based on SCr of 1.24 mg/dL). Liver Function Tests: Recent Labs  Lab 07/09/19 1616 07/10/19 0242 07/10/19 1426 07/11/19 0034 07/12/19 0451  AST 37 42* 48* 46* 44*  ALT 34 35 35 32 37  ALKPHOS 39 39 41 36* 36*  BILITOT 0.5 0.6 0.9 0.7 0.2*  PROT 6.1* 6.2* 5.5* 5.9* 5.5*  ALBUMIN 2.6* 2.8* 2.6* 2.6* 2.3*   No results for input(s): LIPASE, AMYLASE in the last 168 hours. No results for input(s): AMMONIA in the last 168 hours. Coagulation  Profile: No results for input(s): INR, PROTIME in the last 168 hours. Cardiac Enzymes: No results for input(s): CKTOTAL, CKMB, CKMBINDEX, TROPONINI in the last 168 hours. BNP (last 3 results) No results for input(s): PROBNP in the last 8760 hours. HbA1C: No results for input(s): HGBA1C in the last 72 hours. CBG: Recent Labs  Lab 07/10/19 1547 07/10/19 2051  GLUCAP 156* 161*   Lipid Profile: No results for input(s): CHOL, HDL, LDLCALC, TRIG, CHOLHDL, LDLDIRECT in the last 72 hours. Thyroid Function Tests: No results for input(s): TSH, T4TOTAL, FREET4, T3FREE, THYROIDAB in the last 72 hours. Anemia Panel: Recent Labs    07/11/19 0034 07/11/19 1745 07/12/19 0451  VITAMINB12  --  799  --   FOLATE  --  18.4  --   FERRITIN 365*  --  410*  TIBC  --  189*  --   IRON  --  20*  --    Sepsis Labs: Recent Labs  Lab 07/10/19 0242 07/11/19 0848 07/12/19 0451  PROCALCITON 0.59 0.56 0.38    Recent Results (from the past 240 hour(s))  Culture, blood (Routine X 2) w Reflex to ID Panel     Status: None (Preliminary result)   Collection Time: 07/10/19  2:43 AM   Specimen: BLOOD  Result Value Ref Range Status   Specimen Description BLOOD LEFT ANTECUBITAL  Final   Special Requests   Final    BOTTLES DRAWN AEROBIC AND ANAEROBIC Blood Culture results may not be optimal due to an inadequate volume of blood received in culture bottles   Culture   Final    NO GROWTH 2 DAYS Performed at [facility] [address], [place], [place] [address]    Report Status PENDING  Incomplete  Culture, blood (Routine X 2) w Reflex to ID Panel     Status: None (Preliminary result)   Collection Time: 07/10/19  2:44 AM   Specimen: BLOOD  Result Value Ref Range Status   Specimen Description BLOOD LEFT ANTECUBITAL  Final   Special Requests   Final    BOTTLES DRAWN AEROBIC AND ANAEROBIC Blood Culture results may not be optimal due to an inadequate volume of blood received in culture bottles    Culture   Final    NO GROWTH 2 DAYS Performed at [facility], [place] [address], [place],  [address]  Report Status PENDING  Incomplete  MRSA PCR Screening     Status: None   Collection Time: 07/10/19  8:00 PM   Specimen: Nasopharyngeal  Result Value Ref Range Status   MRSA by PCR NEGATIVE NEGATIVE Final    Comment:        The GeneXpert MRSA Assay (FDA approved for NASAL specimens only), is one component of Myrella Fahs comprehensive MRSA colonization surveillance program. It is not intended to diagnose MRSA infection nor to guide or monitor treatment for MRSA infections. Performed at [facility], [place] [address] [place], [place] [address]          Radiology Studies: Dg Chest Port 1 View  Result Date: 07/12/2019 CLINICAL DATA:  Patient with shortness of breath. EXAM: PORTABLE CHEST 1 VIEW COMPARISON:  Chest radiograph 07/12/2019 FINDINGS: Monitoring leads overlie the patient. Stable cardiac and mediastinal contours status post median sternotomy. Similar-appearing bilateral peripheral consolidative opacities. No pleural effusion or pneumothorax. IMPRESSION: Similar-appearing bilateral consolidative opacities. Electronically Signed   By: Lovey Newcomer M.D.   On: 07/12/2019 15:15   Dg Chest Port 1 View  Result Date: 07/12/2019 CLINICAL DATA:  COVID positive.  Shortness of breath. EXAM: PORTABLE CHEST 1 VIEW COMPARISON:  Chest radiograph 07/09/2019 FINDINGS: Monitoring leads overlie the patient. Stable cardiac and mediastinal contours status post median sternotomy. Similar-appearing bilateral predominately mid lower lung peripheral consolidative opacities. No pleural effusion or pneumothorax. IMPRESSION: Similar-appearing peripheral mid lower lung consolidative opacities which may represent atypical/viral pneumonia. Electronically Signed   By: Lovey Newcomer M.D.   On: 07/12/2019 10:07        Scheduled Meds: . amiodarone  200 mg Oral Daily  . amLODipine  2.5  mg Oral Daily  . dexamethasone (DECADRON) injection  6 mg Intravenous Daily  . folic acid  1 mg Oral Daily  . levothyroxine  75 mcg Oral QAC breakfast  . LORazepam  0-4 mg Oral Q6H   Followed by  . LORazepam  0-4 mg Oral Q12H  . metoprolol tartrate  12.5 mg Oral BID  . multivitamin with minerals  1 tablet Oral Daily  . pantoprazole (PROTONIX) IV  40 mg Intravenous Q12H  . pravastatin  40 mg Oral q1800  . sodium chloride flush  3 mL Intravenous Q12H  . tamsulosin  0.4 mg Oral Daily  . thiamine  100 mg Oral Daily   Or  . thiamine  100 mg Intravenous Daily   Continuous Infusions: . levofloxacin (LEVAQUIN) IV 750 mg (07/10/19 1546)  . remdesivir 100 mg in NS 250 mL 100 mg (07/11/19 1654)     LOS: 2 days    Time spent: over 33 min    Fayrene Helper, MD [facility] Pager AMION  If 7PM-7AM, please contact night-coverage [email or web address] Password [facility] 07/12/2019, 3:38 PM

## 2019-07-12 NOTE — TOC Initial Note (Addendum)
Transition of Care [facility]) - Initial/Assessment Note    Patient Details  Name: Ryan Pittman MRN: [id number] Date of Birth: [birth year]  Transition of Care [facility]) CM/SW Contact:    Ninfa Meeker, RN Phone Number: [phone] (working remotely) 07/12/2019, 10:34 AM  Clinical Narrative:  Patient is a 78 yr old male who came to ED  with worsening cough after being diagnosed with COVID-19 on 10/26 , lives home with wife. Patient admitted and transferred to [facility] for further treatment. Patient is receiving Remdesivir, and IV decadron, on RA. CM will continue to monitor.                     Patient Goals and CMS Choice        Expected Discharge Plan and Services : to be determined                                                Prior Living Arrangements/Services                       Activities of Daily Living Home Assistive Devices/Equipment: None ADL Screening (condition at time of admission) Patient's cognitive ability adequate to safely complete daily activities?: Yes Is the patient deaf or have difficulty hearing?: No Does the patient have difficulty seeing, even when wearing glasses/contacts?: No Does the patient have difficulty concentrating, remembering, or making decisions?: No Patient able to express need for assistance with ADLs?: Yes Does the patient have difficulty dressing or bathing?: No Independently performs ADLs?: Yes (appropriate for developmental age) Does the patient have difficulty walking or climbing stairs?: No Weakness of Legs: None Weakness of Arms/Hands: None  Permission Sought/Granted                  Emotional Assessment              Admission diagnosis:  COVID-19 virus infection [U07.1] Patient Active Problem List   Diagnosis Date Noted  . COVID-19 virus infection   . Acute renal failure superimposed on stage 3 chronic kidney disease ([place]) 07/09/2019  . Pneumonia due to COVID-19 virus 07/09/2019  .  Normocytic anemia 07/09/2019  . Ankle pain 07/02/2019  . Viral syndrome 11/06/2018  . CAD (coronary artery disease) 08/08/2018  . PAF (paroxysmal atrial fibrillation) ([place]) 08/06/2018  . Hx of CABG 07/11/2018  . Health care maintenance 04/04/2018  . Atypical chest pain 04/04/2018  . Hypothyroidism 03/30/2017  . Advance care planning 03/30/2017  . 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 Renal artery stenosis ([place]) 07/04/2016  . 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 Hyperglycemia 12/28/2015  . Hyperlipidemia 12/28/2015  . Thyroid cancer ([place]) 11/12/2014  . Shortness of breath 04/22/2014  . Spasm 04/22/2014  . Other cervical disc degeneration, unspecified cervical region 04/08/2014  . RLS (restless legs syndrome) 11/16/2013  . Insomnia 06/18/2013  . Medicare annual wellness visit, subsequent 03/12/2012  . HTN (hypertension) 08/14/2011  . BPH (benign prostatic hyperplasia) 04/28/2011  . CKD (chronic kidney disease) stage 3, GFR 30-59 ml/min 02/13/2011  . OA (osteoarthritis) 02/13/2011  . Carotid stenosis 02/13/2011  . ED (erectile dysfunction) 02/13/2011  . Duodenal ulcer 02/13/2011  . Diverticulosis 02/13/2011  . PVD (peripheral vascular disease) ([place]) 01/26/2011  . S/P bypass graft of extremity 01/26/2011  . Back pain 01/26/2011  . 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  Reduced libido 02/14/2010  . Nocturia 05/12/2008  . Urinary hesitancy 05/12/2008   PCP:  Tonia Ghent, MD Pharmacy:   [facility], [place] [address] Phone: [phone] Fax: [phone]  [place], [place] [address] Phone: [phone] Fax: [phone]     Social Determinants of Health (SDOH) Interventions    Readmission Risk Interventions No flowsheet data found.

## 2019-07-13 ENCOUNTER — Inpatient Hospital Stay (HOSPITAL_COMMUNITY): Payer: Medicare Other

## 2019-07-13 LAB — CBC WITH DIFFERENTIAL/PLATELET
Abs Immature Granulocytes: 0.88 10*3/uL — ABNORMAL HIGH (ref 0.00–0.07)
Basophils Absolute: 0.1 10*3/uL (ref 0.0–0.1)
Basophils Relative: 0 %
Eosinophils Absolute: 0 10*3/uL (ref 0.0–0.5)
Eosinophils Relative: 0 %
HCT: 27.5 % — ABNORMAL LOW (ref 39.0–52.0)
Hemoglobin: 8.8 g/dL — ABNORMAL LOW (ref 13.0–17.0)
Immature Granulocytes: 5 %
Lymphocytes Relative: 3 %
Lymphs Abs: 0.6 10*3/uL — ABNORMAL LOW (ref 0.7–4.0)
MCH: 29.6 pg (ref 26.0–34.0)
MCHC: 32 g/dL (ref 30.0–36.0)
MCV: 92.6 fL (ref 80.0–100.0)
Monocytes Absolute: 1.1 10*3/uL — ABNORMAL HIGH (ref 0.1–1.0)
Monocytes Relative: 5 %
Neutro Abs: 17 10*3/uL — ABNORMAL HIGH (ref 1.7–7.7)
Neutrophils Relative %: 87 %
Platelets: 330 10*3/uL (ref 150–400)
RBC: 2.97 MIL/uL — ABNORMAL LOW (ref 4.22–5.81)
RDW: 15.4 % (ref 11.5–15.5)
WBC: 19.6 10*3/uL — ABNORMAL HIGH (ref 4.0–10.5)
nRBC: 0 % (ref 0.0–0.2)

## 2019-07-13 LAB — COMPREHENSIVE METABOLIC PANEL
ALT: 37 U/L (ref 0–44)
AST: 35 U/L (ref 15–41)
Albumin: 2.3 g/dL — ABNORMAL LOW (ref 3.5–5.0)
Alkaline Phosphatase: 43 U/L (ref 38–126)
Anion gap: 9 (ref 5–15)
BUN: 49 mg/dL — ABNORMAL HIGH (ref 8–23)
CO2: 19 mmol/L — ABNORMAL LOW (ref 22–32)
Calcium: 7.9 mg/dL — ABNORMAL LOW (ref 8.9–10.3)
Chloride: 108 mmol/L (ref 98–111)
Creatinine, Ser: 1.16 mg/dL (ref 0.61–1.24)
GFR calc Af Amer: >60 mL/min (ref >60)
GFR calc non Af Amer: 59 mL/min — ABNORMAL LOW (ref >60)
Glucose, Bld: 141 mg/dL — ABNORMAL HIGH (ref 70–99)
Potassium: 5.4 mmol/L — ABNORMAL HIGH (ref 3.5–5.1)
Sodium: 136 mmol/L (ref 135–145)
Total Bilirubin: 0.6 mg/dL (ref 0.3–1.2)
Total Protein: 5.2 g/dL — ABNORMAL LOW (ref 6.5–8.1)

## 2019-07-13 LAB — MAGNESIUM: Magnesium: 1.8 mg/dL (ref 1.7–2.4)

## 2019-07-13 LAB — PROCALCITONIN: Procalcitonin: 0.22 ng/mL

## 2019-07-13 LAB — D-DIMER, QUANTITATIVE: D-Dimer, Quant: 2.74 ug/mL-FEU — ABNORMAL HIGH (ref 0.00–0.50)

## 2019-07-13 LAB — BRAIN NATRIURETIC PEPTIDE: B Natriuretic Peptide: 419.9 pg/mL — ABNORMAL HIGH (ref 0.0–100.0)

## 2019-07-13 LAB — C-REACTIVE PROTEIN: CRP: 13.9 mg/dL — ABNORMAL HIGH (ref <1.0)

## 2019-07-13 LAB — PHOSPHORUS: Phosphorus: 4 mg/dL (ref 2.5–4.6)

## 2019-07-13 LAB — FERRITIN: Ferritin: 337 ng/mL — ABNORMAL HIGH (ref 24–336)

## 2019-07-13 MED ORDER — SODIUM CHLORIDE 0.9% IV SOLUTION: Freq: Once | INTRAVENOUS | Status: DC

## 2019-07-13 MED ORDER — PANTOPRAZOLE SODIUM 40 MG PO TBEC
40.0000 mg | DELAYED_RELEASE_TABLET | Freq: Every day | ORAL | Status: DC
  Administered 2019-07-14 – 2019-07-19 (×6): 40 mg via ORAL
  Filled 2019-07-13 (×6): qty 1

## 2019-07-13 MED ORDER — CLOPIDOGREL BISULFATE 75 MG PO TABS
75.0000 mg | ORAL_TABLET | Freq: Every day | ORAL | Status: DC
  Administered 2019-07-13 – 2019-07-18 (×6): 75 mg via ORAL
  Filled 2019-07-13 (×6): qty 1

## 2019-07-13 MED ORDER — ENOXAPARIN SODIUM 40 MG/0.4ML ~~LOC~~ SOLN
40.0000 mg | SUBCUTANEOUS | Status: DC
  Administered 2019-07-13: 09:00:00 40 mg via SUBCUTANEOUS
  Filled 2019-07-13: qty 0.4

## 2019-07-13 MED ORDER — FUROSEMIDE 10 MG/ML IJ SOLN
40.0000 mg | Freq: Once | INTRAMUSCULAR | Status: AC
  Administered 2019-07-13: 40 mg via INTRAVENOUS
  Filled 2019-07-13: qty 4

## 2019-07-13 MED ORDER — ENOXAPARIN SODIUM 40 MG/0.4ML ~~LOC~~ SOLN
40.0000 mg | Freq: Two times a day (BID) | SUBCUTANEOUS | Status: DC
  Administered 2019-07-13 – 2019-07-16 (×6): 40 mg via SUBCUTANEOUS
  Filled 2019-07-13 (×6): qty 0.4

## 2019-07-13 MED ORDER — ORAL CARE MOUTH RINSE
15.0000 mL | Freq: Two times a day (BID) | OROMUCOSAL | Status: DC
  Administered 2019-07-13 – 2019-07-19 (×11): 15 mL via OROMUCOSAL

## 2019-07-13 MED ORDER — METHYLPREDNISOLONE SODIUM SUCC 40 MG IJ SOLR
40.0000 mg | Freq: Two times a day (BID) | INTRAMUSCULAR | Status: DC
  Administered 2019-07-13 – 2019-07-17 (×9): 40 mg via INTRAVENOUS
  Filled 2019-07-13 (×9): qty 1

## 2019-07-13 NOTE — Progress Notes (Signed)
Pharmacy Antibiotic Note  Ryan Pittman is a 78 y.o. male admitted on 07/09/2019 with COVID-19 pneumonia.  Pharmacy folllowing for Remdesivir and Levaquin dosing.  PCN allergy: immediate rash, facial/tongue/throat swelling, SOB, or lightheadedness with hypotension.  Unable to reach patient for further allergy clarification.  No history of PCN or Ceph use in [facility] records.  Plan: Levaquin 750 mg IV q48h for 5 days -ends 11/9 Remdesivir by 100 mg daily x 4 days -ends 11/9   Height: 5\' 7"  (170.2 cm) Weight: 158 lb 8.2 oz (71.9 kg) IBW/kg (Calculated) : 66.1  Temp (24hrs), Avg:97.9 F (36.6 C), Min:97.7 F (36.5 C), Max:98.1 F (36.7 C)  Recent Labs  Lab 07/09/19 1616 07/10/19 0242 07/10/19 1426 07/11/19 0034 07/12/19 0451 07/13/19 0512  WBC 16.0* 18.8*  --  16.1* 18.3* 19.6*  CREATININE 1.91* 2.03* 1.60* 1.51* 1.24 1.16    Estimated Creatinine Clearance: 47.5 mL/min (by C-G formula based on SCr of 1.16 mg/dL).    Allergies  Allergen Reactions  . Penicillins Itching and Other (See Comments)    PATIENT HAS HAD A PCN REACTION WITH IMMEDIATE RASH, FACIAL/TONGUE/THROAT SWELLING, SOB, OR LIGHTHEADEDNESS WITH HYPOTENSION:  #  #  YES  #  #  Has patient had a PCN reaction causing severe rash involving mucus membranes or skin necrosis: No Has patient had a PCN reaction that required hospitalization: No Has patient had a PCN reaction occurring within the last 10 years: No If all of the above answers are "NO", then may proceed with Cephalosporin use.   . Ace Inhibitors Cough  . Aspirin Other (See Comments)    H/o GI bleed  . Celebrex [Celecoxib] Other (See Comments)    GI bleed  . Lipitor [Atorvastatin] Other (See Comments)    myalgias    Antimicrobials this admission: 11/5 Remdesivir >> 11/9 11/5 Levaquin >> 11/9  Microbiology results: 11/5 BCx: ngtd 11/5 MRSA PCR: neg  Thank you for allowing pharmacy to be a part of this patient's care.  Sherlon Handing, PharmD, BCPS CGV  Clinical pharmacist phone [phone] 07/13/2019 11:53 AM

## 2019-07-13 NOTE — Progress Notes (Addendum)
PROGRESS NOTE    Ryan Pittman  [id number] DOB: 08-27-1941 DOA: 07/09/2019 PCP: Tonia Ghent, MD   Brief Narrative:  78 y.o.malewith medical history significant forcoronary artery diseasestatus-post CABG, HTN, hypothyroidism, and CKD III, diagnosed with COVID-19 on 10/26 and presenting with worsening cough.Patient reports he is diagnosed with COVID-19 on 06/30/2019, and has had progressively worsening cough despite using Tessalon at home, denies any chest pain, does not feel very short of breath, and has not noted any lower extremity swelling or tenderness. Patient reports loose stools for the past couple days without any abdominal pain, nausea, or vomiting. Reports that the stool has been very dark but wonders if it is due to his use of Pepto-Bismol.  ED Course:Upon arrival to the ED, patient is found to be febrile to 39.3 C, saturating mid-90's on room air, mildly tachypneic, and with stable BP.EKG features sinus rhythm and chest x-ray is notable for mild bilateral lower lobe infiltration. Chemistry panel features creatinine 1.91, up from 1.18 last month. CBC notable for leukocytosis to 16,000 and a normocytic anemia with hemoglobin 9.1, down from 10.5 last month. Patient was treated with acetaminophen, albuterol, and Protonix in the ED.  Assessment & Plan:   Principal Problem:   Acute renal failure superimposed on stage 3 chronic kidney disease (HCC) Active Problems:   HTN (hypertension)   Hypothyroidism   CAD (coronary artery disease)   Pneumonia due to COVID-19 virus   Normocytic anemia   COVID-19 virus infection  2.COVID-19 pneumonia Possible Bacterial Pneumonia  Diarrhea likely 2/2 COVID 19 Infection -Presents with worsening cough after being diagnosed with COVID-19 on 10/26 -Pt noted to be ttachypneic, with positive CXR findings, not requiring supplemental O2 - O2 needs increased again today -> sats in low 90's, high 80's on 15 L [place] while eating -  given worsening O2 requirement, I discussed convalescent plasma again with pt and his wife.  They were agreeable to this.  Discussed risk/benefits/reviewed consent with pt/wife. - Given concern for possible bacterial pneumonia at presentation with PCT 0.56, probably not great candidate for actemra. - follow BNP (elevated) -> trial of lasix as well - Remdesivir (day 4) - dexamethasone -> methylprednisone high dose - high dose DVT ppx given worsening  - discussed with nursing to encourage proning as able - elevated CRP and procalcitonin and WBC suggest possible bacterial pneumonia - continue levaquin  - Daily labs (CBC, CMP, CRP, d dimer, ferritin) - CRP and ferritin downtrending, d dimer up - diarrhea has improved - CXR with bilateral consolidative opacities - rads recommended f/u in 3-4 weeks  COVID-19 Labs  Recent Labs    07/11/19 0034 07/12/19 0451 07/13/19 0512  DDIMER 0.98* 1.53* 2.74*  FERRITIN 365* 410* 337*  CRP 35.2* 19.2* 13.9*    Lab Results  Component Value Date   SARSCOV2NAA Detected (A) 06/30/2019   [place] NEGATIVE 06/23/2019    1.Acute kidney injury superimposed on CKD III -Presented with increased cough with recent diagnosis of covid -Pt since noted increased loose stools with decreased PO intake with resultant Cr up to 1.9 from baseline of 1.2 - improved to 1.16 today -ARB on hold -Follow with diuresis  3.Normocytic anemia; MGUS; ?melena -Hb down to 8.2 today - suspect in part dilutional - Still awaiting hemoccult, though lack of diarrhea/stools makes me less suspicious of active bleed at this time - possible that dark stools related to pepto bismol  - Rectal exam with brown stool, negative hemoccult, nursing at bedside - may need  GI c/s pending hemoccult or if Hb continues trending down - iron panel, b12, folate -> iron deficiency anemia - consider iron infusion prior to d/c -> needs outpatient workup for this (consider inpatient GI c/s if  positive guiac) - maintain active type and screen -resume plavix - PO PPI  4.CAD -Denies chest pain at this time -Given concerns of dark stools, holdingPlavix per above, continue beta-blockerand statin - Would resume Plavix if stools are neg for blood  5.Hypertension -BP appropriate -Holdinglosartan in light of AKI, continue Lopressor and Norvasc as tolerated(some bradycardia, continue to monitor, holding parameters for metop)  6.Hypothyroidism -Status-post thyroidectomy -Continue Synthroidas tolerated  7.Paroxysmal atrial fibrillation -Patient had rapid atrial fibrillation while still in hospital after his CABG, has been in SR on amiodarone and is not anticoagulated pta -Continueamiodaroneas tolerated  DVT prophylaxis: SCD Code Status: full  Family Communication: discussed with wife 11/8 Disposition Plan: pending further improvement   Consultants:   none  Procedures:   none  Antimicrobials:  Anti-infectives (From admission, onward)   Start     Dose/Rate Route Frequency Ordered Stop   07/11/19 1600  remdesivir 100 mg in sodium chloride 0.9 % 250 mL IVPB     100 mg 500 mL/hr over 30 Minutes Intravenous Every 24 hours 07/10/19 1519 07/15/19 1559   07/10/19 1600  levofloxacin (LEVAQUIN) IVPB 750 mg     750 mg 100 mL/hr over 90 Minutes Intravenous Every 48 hours 07/10/19 1519 07/14/19 2359   07/10/19 1600  remdesivir 200 mg in sodium chloride 0.9 % 250 mL IVPB     200 mg 500 mL/hr over 30 Minutes Intravenous Once 07/10/19 1519 07/10/19 1830     Subjective: Comfortable.  Denies any obvious complaints.  Objective: Vitals:   07/13/19 0436 07/13/19 0700 07/13/19 0800 07/13/19 0909  BP:  (!) 160/55 (!) 161/52   Pulse:  (!) 47 (!) 48 60  Resp:  (!) 21 (!) 24   Temp:      TempSrc:      SpO2:  (!) 85% (!) 88%   Weight: 71.9 kg     Height:        Intake/Output Summary (Last 24 hours) at 07/13/2019 1205 Last data filed at 07/13/2019  0900 Gross per 24 hour  Intake 1566.04 ml  Output 1950 ml  Net -383.96 ml   Filed Weights   07/11/19 0432 07/12/19 0500 07/13/19 0436  Weight: 68.2 kg 68 kg 71.9 kg    Examination:  General: No acute distress. Cardiovascular: Heart sounds show a regular rate, and rhythm Lungs: distant lung sounds on isolation stethoscope, no obvious adventitious lung sounds Abdomen: Soft, nontender, nondistended Neurological: Alert and oriented 3. Moves all extremities 4 . Cranial nerves II through XII grossly intact. Skin: Warm and dry. No rashes or lesions. Extremities: No clubbing or cyanosis. No edema.    Data Reviewed: I have personally reviewed following labs and imaging studies  CBC: Recent Labs  Lab 07/09/19 1616 07/10/19 0242 07/10/19 1426 07/11/19 0034 07/11/19 1416 07/12/19 0451 07/13/19 0512  WBC 16.0* 18.8*  --  16.1*  --  18.3* 19.6*  NEUTROABS 15.1* 17.6*  --  15.2*  --  16.7* 17.0*  HGB 9.1* 8.9* 9.0* 8.2* 8.7* 8.4* 8.8*  HCT 29.2* 27.7* 28.2* 25.8* 27.4* 26.5* 27.5*  MCV 95.4 93.6  --  94.5  --  91.1 92.6  PLT 246 257  --  249  --  298 [id number]   Basic Metabolic Panel: Recent Labs  Lab 07/10/19  [id number] 07/10/19 1426 07/11/19 0034 07/12/19 0451 07/13/19 0512  NA 138 137 138 138 136  K 4.4 4.7 5.0 4.9 5.4*  CL 108 110 107 109 108  CO2 20* 16* 20* 19* 19*  GLUCOSE 133* 189* 153* 151* 141*  BUN 39* 43* 41* 50* 49*  CREATININE 2.03* 1.60* 1.51* 1.24 1.16  CALCIUM 8.4* 7.8* 8.1* 8.3* 7.9*  MG 2.2  --  2.3 2.0 1.8  PHOS 3.7  --  3.3 4.0 4.0   GFR: Estimated Creatinine Clearance: 47.5 mL/min (by C-G formula based on SCr of 1.16 mg/dL). Liver Function Tests: Recent Labs  Lab 07/10/19 0242 07/10/19 1426 07/11/19 0034 07/12/19 0451 07/13/19 0512  AST 42* 48* 46* 44* 35  ALT 35 35 32 37 37  ALKPHOS 39 41 36* 36* 43  BILITOT 0.6 0.9 0.7 0.2* 0.6  PROT 6.2* 5.5* 5.9* 5.5* 5.2*  ALBUMIN 2.8* 2.6* 2.6* 2.3* 2.3*   No results for input(s): LIPASE, AMYLASE in the  last 168 hours. No results for input(s): AMMONIA in the last 168 hours. Coagulation Profile: No results for input(s): INR, PROTIME in the last 168 hours. Cardiac Enzymes: No results for input(s): CKTOTAL, CKMB, CKMBINDEX, TROPONINI in the last 168 hours. BNP (last 3 results) No results for input(s): PROBNP in the last 8760 hours. HbA1C: No results for input(s): HGBA1C in the last 72 hours. CBG: Recent Labs  Lab 07/10/19 1547 07/10/19 2051  GLUCAP 156* 161*   Lipid Profile: No results for input(s): CHOL, HDL, LDLCALC, TRIG, CHOLHDL, LDLDIRECT in the last 72 hours. Thyroid Function Tests: No results for input(s): TSH, T4TOTAL, FREET4, T3FREE, THYROIDAB in the last 72 hours. Anemia Panel: Recent Labs    07/11/19 1745 07/12/19 0451 07/13/19 0512  VITAMINB12 799  --   --   FOLATE 18.4  --   --   FERRITIN  --  410* 337*  TIBC 189*  --   --   IRON 20*  --   --    Sepsis Labs: Recent Labs  Lab 07/10/19 0242 07/11/19 0848 07/12/19 0451 07/13/19 0512  PROCALCITON 0.59 0.56 0.38 0.22    Recent Results (from the past 240 hour(s))  Culture, blood (Routine X 2) w Reflex to ID Panel     Status: None (Preliminary result)   Collection Time: 07/10/19  2:43 AM   Specimen: BLOOD  Result Value Ref Range Status   Specimen Description BLOOD LEFT ANTECUBITAL  Final   Special Requests   Final    BOTTLES DRAWN AEROBIC AND ANAEROBIC Blood Culture results may not be optimal due to an inadequate volume of blood received in culture bottles   Culture   Final    NO GROWTH 3 DAYS Performed at [facility], [place] [address], [place], [place] [address]    Report Status PENDING  Incomplete  Culture, blood (Routine X 2) w Reflex to ID Panel     Status: None (Preliminary result)   Collection Time: 07/10/19  2:44 AM   Specimen: BLOOD  Result Value Ref Range Status   Specimen Description BLOOD LEFT ANTECUBITAL  Final   Special Requests   Final    BOTTLES DRAWN AEROBIC AND ANAEROBIC Blood  Culture results may not be optimal due to an inadequate volume of blood received in culture bottles   Culture   Final    NO GROWTH 3 DAYS Performed at [facility], [place] [address] [place], [place] [address]    Report Status PENDING  Incomplete  MRSA PCR Screening     Status: None   Collection Time: 07/10/19  8:00 PM   Specimen: Nasopharyngeal  Result Value Ref Range Status   MRSA by PCR NEGATIVE NEGATIVE Final    Comment:        The GeneXpert MRSA Assay (FDA approved for NASAL specimens only), is one component of a comprehensive MRSA colonization surveillance program. It is not intended to diagnose MRSA infection nor to guide or monitor treatment for MRSA infections. Performed at [facility], [place] [address] [place], [place] [address]          Radiology Studies: Dg Chest Port 1 View  Result Date: 07/12/2019 CLINICAL DATA:  Patient with shortness of breath. EXAM: PORTABLE CHEST 1 VIEW COMPARISON:  Chest radiograph 07/12/2019 FINDINGS: Monitoring leads overlie the patient. Stable cardiac and mediastinal contours status post median sternotomy. Similar-appearing bilateral peripheral consolidative opacities. No pleural effusion or pneumothorax. IMPRESSION: Similar-appearing bilateral consolidative opacities. Electronically Signed   By: Lovey Newcomer M.D.   On: 07/12/2019 15:15   Dg Chest Port 1 View  Result Date: 07/12/2019 CLINICAL DATA:  COVID positive.  Shortness of breath. EXAM: PORTABLE CHEST 1 VIEW COMPARISON:  Chest radiograph 07/09/2019 FINDINGS: Monitoring leads overlie the patient. Stable cardiac and mediastinal contours status post median sternotomy. Similar-appearing bilateral predominately mid lower lung peripheral consolidative opacities. No pleural effusion or pneumothorax. IMPRESSION: Similar-appearing peripheral mid lower lung consolidative opacities which may represent atypical/viral pneumonia. Electronically Signed   By: Lovey Newcomer M.D.    On: 07/12/2019 10:07        Scheduled Meds: . sodium chloride   Intravenous Once  . amiodarone  200 mg Oral Daily  . amLODipine  2.5 mg Oral Daily  . enoxaparin (LOVENOX) injection  40 mg Subcutaneous Q12H  . folic acid  1 mg Oral Daily  . furosemide  40 mg Intravenous Once  . levothyroxine  75 mcg Oral QAC breakfast  . LORazepam  0-4 mg Oral Q12H  . mouth rinse  15 mL Mouth Rinse BID  . methylPREDNISolone (SOLU-MEDROL) injection  40 mg Intravenous Q12H  . metoprolol tartrate  12.5 mg Oral BID  . multivitamin with minerals  1 tablet Oral Daily  . pantoprazole (PROTONIX) IV  40 mg Intravenous Q12H  . pravastatin  40 mg Oral q1800  . sodium chloride flush  3 mL Intravenous Q12H  . tamsulosin  0.4 mg Oral Daily  . thiamine  100 mg Oral Daily   Or  . thiamine  100 mg Intravenous Daily   Continuous Infusions: . levofloxacin (LEVAQUIN) IV 750 mg (07/12/19 1852)  . remdesivir 100 mg in NS 250 mL 100 mg (07/12/19 1849)     LOS: 3 days    Time spent: over 30 min 40 min critical care time with worsening AHRF   Fayrene Helper, MD [facility] Pager AMION  If 7PM-7AM, please contact night-coverage [email or web address] Password [email or web address] 07/13/2019, 12:05 PM

## 2019-07-13 NOTE — Progress Notes (Signed)
Pt found on nasal cannula @ 8L. O2 sats low 80s, NAD, pt denies increased WOB. Equipment changed to HFNC with humidification and placed on 15L. MD aware.

## 2019-07-14 ENCOUNTER — Telehealth: Payer: Self-pay | Admitting: *Deleted

## 2019-07-14 LAB — CBC WITH DIFFERENTIAL/PLATELET
Abs Immature Granulocytes: 1.34 10*3/uL — ABNORMAL HIGH (ref 0.00–0.07)
Basophils Absolute: 0.1 10*3/uL (ref 0.0–0.1)
Basophils Relative: 1 %
Eosinophils Absolute: 0 10*3/uL (ref 0.0–0.5)
Eosinophils Relative: 0 %
HCT: 28 % — ABNORMAL LOW (ref 39.0–52.0)
Hemoglobin: 9.1 g/dL — ABNORMAL LOW (ref 13.0–17.0)
Immature Granulocytes: 6 %
Lymphocytes Relative: 3 %
Lymphs Abs: 0.6 10*3/uL — ABNORMAL LOW (ref 0.7–4.0)
MCH: 29.4 pg (ref 26.0–34.0)
MCHC: 32.5 g/dL (ref 30.0–36.0)
MCV: 90.6 fL (ref 80.0–100.0)
Monocytes Absolute: 1.1 10*3/uL — ABNORMAL HIGH (ref 0.1–1.0)
Monocytes Relative: 5 %
Neutro Abs: 18.5 10*3/uL — ABNORMAL HIGH (ref 1.7–7.7)
Neutrophils Relative %: 85 %
Platelets: 354 10*3/uL (ref 150–400)
RBC: 3.09 MIL/uL — ABNORMAL LOW (ref 4.22–5.81)
RDW: 15.3 % (ref 11.5–15.5)
WBC: 21.7 10*3/uL — ABNORMAL HIGH (ref 4.0–10.5)
nRBC: 0.1 % (ref 0.0–0.2)

## 2019-07-14 LAB — HEMOGLOBIN A1C
Hgb A1c MFr Bld: 6.1 % — ABNORMAL HIGH (ref 4.8–5.6)
Mean Plasma Glucose: 128.37 mg/dL

## 2019-07-14 LAB — BPAM FFP
Blood Product Expiration Date: 202011091327
ISSUE DATE / TIME: 202011081329
Unit Type and Rh: 6200

## 2019-07-14 LAB — COMPREHENSIVE METABOLIC PANEL
ALT: 36 U/L (ref 0–44)
AST: 27 U/L (ref 15–41)
Albumin: 2.1 g/dL — ABNORMAL LOW (ref 3.5–5.0)
Alkaline Phosphatase: 42 U/L (ref 38–126)
Anion gap: 11 (ref 5–15)
BUN: 54 mg/dL — ABNORMAL HIGH (ref 8–23)
CO2: 20 mmol/L — ABNORMAL LOW (ref 22–32)
Calcium: 8.1 mg/dL — ABNORMAL LOW (ref 8.9–10.3)
Chloride: 106 mmol/L (ref 98–111)
Creatinine, Ser: 1.22 mg/dL (ref 0.61–1.24)
GFR calc Af Amer: >60 mL/min (ref >60)
GFR calc non Af Amer: 56 mL/min — ABNORMAL LOW (ref >60)
Glucose, Bld: 190 mg/dL — ABNORMAL HIGH (ref 70–99)
Potassium: 4.6 mmol/L (ref 3.5–5.1)
Sodium: 137 mmol/L (ref 135–145)
Total Bilirubin: 0.5 mg/dL (ref 0.3–1.2)
Total Protein: 5.2 g/dL — ABNORMAL LOW (ref 6.5–8.1)

## 2019-07-14 LAB — PREPARE FRESH FROZEN PLASMA: Unit division: 0

## 2019-07-14 LAB — C-REACTIVE PROTEIN: CRP: 10.6 mg/dL — ABNORMAL HIGH (ref <1.0)

## 2019-07-14 LAB — GLUCOSE, CAPILLARY: Glucose-Capillary: 276 mg/dL — ABNORMAL HIGH (ref 70–99)

## 2019-07-14 LAB — BRAIN NATRIURETIC PEPTIDE: B Natriuretic Peptide: 208.7 pg/mL — ABNORMAL HIGH (ref 0.0–100.0)

## 2019-07-14 LAB — D-DIMER, QUANTITATIVE: D-Dimer, Quant: 1.61 ug/mL-FEU — ABNORMAL HIGH (ref 0.00–0.50)

## 2019-07-14 LAB — PHOSPHORUS: Phosphorus: 4.6 mg/dL (ref 2.5–4.6)

## 2019-07-14 LAB — MAGNESIUM: Magnesium: 1.8 mg/dL (ref 1.7–2.4)

## 2019-07-14 LAB — FERRITIN: Ferritin: 285 ng/mL (ref 24–336)

## 2019-07-14 MED ORDER — INSULIN ASPART 100 UNIT/ML ~~LOC~~ SOLN
0.0000 [IU] | Freq: Every day | SUBCUTANEOUS | Status: DC
  Administered 2019-07-14: 22:00:00 3 [IU] via SUBCUTANEOUS
  Administered 2019-07-15: 22:00:00 2 [IU] via SUBCUTANEOUS
  Administered 2019-07-17: 22:00:00 3 [IU] via SUBCUTANEOUS

## 2019-07-14 MED ORDER — SODIUM CHLORIDE 0.9% IV SOLUTION
Freq: Once | INTRAVENOUS | Status: AC
  Administered 2019-07-15: 02:00:00 via INTRAVENOUS

## 2019-07-14 MED ORDER — FUROSEMIDE 10 MG/ML IJ SOLN
40.0000 mg | Freq: Four times a day (QID) | INTRAMUSCULAR | Status: AC
  Administered 2019-07-14 (×2): 40 mg via INTRAVENOUS
  Filled 2019-07-14 (×2): qty 4

## 2019-07-14 MED ORDER — INSULIN ASPART 100 UNIT/ML ~~LOC~~ SOLN
0.0000 [IU] | Freq: Three times a day (TID) | SUBCUTANEOUS | Status: DC
  Administered 2019-07-15: 10:00:00 2 [IU] via SUBCUTANEOUS
  Administered 2019-07-15 (×2): 3 [IU] via SUBCUTANEOUS
  Administered 2019-07-16: 5 [IU] via SUBCUTANEOUS
  Administered 2019-07-16: 13:00:00 7 [IU] via SUBCUTANEOUS
  Administered 2019-07-16: 2 [IU] via SUBCUTANEOUS
  Administered 2019-07-17: 9 [IU] via SUBCUTANEOUS
  Administered 2019-07-17: 09:00:00 2 [IU] via SUBCUTANEOUS
  Administered 2019-07-17: 7 [IU] via SUBCUTANEOUS
  Administered 2019-07-18: 5 [IU] via SUBCUTANEOUS

## 2019-07-14 NOTE — Progress Notes (Addendum)
PROGRESS NOTE    Ryan Pittman  [id number] DOB: 1940-11-10 DOA: 07/09/2019 PCP: Tonia Ghent, MD   Brief Narrative:  78 y.o.malewith medical history significant forcoronary artery diseasestatus-post CABG, HTN, hypothyroidism, and CKD III, diagnosed with COVID-19 on 10/26 and presenting with worsening cough.Patient reports he is diagnosed with COVID-19 on 06/30/2019, and has had progressively worsening cough despite using Tessalon at home, denies any chest pain, does not feel very short of breath, and has not noted any lower extremity swelling or tenderness. Patient reports loose stools for the past couple days without any abdominal pain, nausea, or vomiting. Reports that the stool has been very dark but wonders if it is due to his use of Pepto-Bismol.  ED Course:Upon arrival to the ED, patient is found to be febrile to 39.3 C, saturating mid-90's on room air, mildly tachypneic, and with stable BP.EKG features sinus rhythm and chest x-ray is notable for mild bilateral lower lobe infiltration. Chemistry panel features creatinine 1.91, up from 1.18 last month. CBC notable for leukocytosis to 16,000 and Ryan Pittman normocytic anemia with hemoglobin 9.1, down from 10.5 last month. Patient was treated with acetaminophen, albuterol, and Protonix in the ED.  Assessment & Plan:   Principal Problem:   Acute renal failure superimposed on stage 3 chronic kidney disease (HCC) Active Problems:   HTN (hypertension)   Hypothyroidism   CAD (coronary artery disease)   Pneumonia due to COVID-19 virus   Normocytic anemia   COVID-19 virus infection  2.COVID-19 pneumonia Possible Bacterial Pneumonia  Diarrhea likely 2/2 COVID 19 Infection -Presents with worsening cough after being diagnosed with COVID-19 on 10/26 -Pt noted to be ttachypneic, with positive CXR findings, not requiring supplemental O2 - Comfortable on 10 L today - CXR 11/8 with diffuse bilateral interstitial and patchy  alveolar airspace opacities involving the upper and lower lungs - convalescent plasma given 11/8 (only received partial treatment as lost IV)  - repeat convalescent plasma on 11/9 given partial treatment on 11/8 and pt still with significant O2 requirement (discussed with pt and wife) - Given concern for possible bacterial pneumonia at presentation with PCT 0.56, probably not great candidate for actemra. - follow BNP (elevated) -> repeat lasix today - Remdesivir  - dexamethasone -> methylprednisone high dose - high dose DVT ppx given worsening  - discussed with nursing to encourage proning as able - elevated CRP and procalcitonin and WBC suggest possible bacterial pneumonia - continue levaquin (last dose today) - Daily labs (CBC, CMP, CRP, d dimer, ferritin) - improving - diarrhea has improved - CXR with bilateral consolidative opacities - rads recommended f/u in 3-4 weeks  COVID-19 Labs  Recent Labs    07/12/19 0451 07/13/19 0512 07/14/19 0338  DDIMER 1.53* 2.74* 1.61*  FERRITIN 410* 337* 285  CRP 19.2* 13.9* 10.6*    Lab Results  Component Value Date   SARSCOV2NAA Detected (Takara Sermons) 06/30/2019   [place] NEGATIVE 06/23/2019    1.Acute kidney injury superimposed on CKD III -Presented with increased cough with recent diagnosis of covid -Pt since noted increased loose stools with decreased PO intake with resultant Cr up to 1.9 from baseline of 1.2 - improved to 1.22 today -ARB on hold -Follow with diuresis  3.Normocytic anemia; MGUS; ?melena -Hb down to 8.2 today - suspect in part dilutional - Still awaiting hemoccult, though lack of diarrhea/stools makes me less suspicious of active bleed at this time - possible that dark stools related to pepto bismol  - Rectal exam with brown stool, negative  hemoccult, nursing at bedside during exam - may need GI c/s pending hemoccult or if Hb continues trending down - iron panel, b12, folate -> iron deficiency anemia - consider iron  infusion prior to d/c -> needs outpatient workup for this (consider inpatient GI c/s if positive guiac) - maintain active type and screen -resume plavix - PO PPI  4.CAD -Denies chest pain at this time -Given concerns of dark stools, holdingPlavix per above, continue beta-blockerand statin - Would resume Plavix if stools are neg for blood  5.Hypertension -BP appropriate -Holdinglosartan in light of AKI, continue Lopressor and Norvasc as tolerated(some bradycardia, continue to monitor, holding parameters for metop)  6.Hypothyroidism -Status-post thyroidectomy -Continue Synthroidas tolerated  7.Paroxysmal atrial fibrillation -Patient had rapid atrial fibrillation while still in hospital after his CABG, has been in SR on amiodarone and is not anticoagulated pta -Continueamiodaroneas tolerated  # Hyperglycemia: A1c 5.7 in 2019, repeat at this time.  Start sensitive SSI and follow closely.  DVT prophylaxis: SCD Code Status: full  Family Communication: discussed with wife 11/8 Disposition Plan: pending further improvement   Consultants:   none  Procedures:   none  Antimicrobials:  Anti-infectives (From admission, onward)   Start     Dose/Rate Route Frequency Ordered Stop   07/11/19 1600  remdesivir 100 mg in sodium chloride 0.9 % 250 mL IVPB     100 mg 500 mL/hr over 30 Minutes Intravenous Every 24 hours 07/10/19 1519 07/15/19 1559   07/10/19 1600  levofloxacin (LEVAQUIN) IVPB 750 mg     750 mg 100 mL/hr over 90 Minutes Intravenous Every 48 hours 07/10/19 1519 07/14/19 2359   07/10/19 1600  remdesivir 200 mg in sodium chloride 0.9 % 250 mL IVPB     200 mg 500 mL/hr over 30 Minutes Intravenous Once 07/10/19 1519 07/10/19 1830     Subjective: Comfortable No complaints  Objective: Vitals:   07/14/19 1300 07/14/19 1315 07/14/19 1458 07/14/19 1610  BP: 125/65   (!) 131/53  Pulse: (!) 56   (!) 57  Resp: 19  (!) 22 (!) 22  Temp:   97.9  F (36.6 C)   TempSrc:   Oral   SpO2: (!) 87% (!) 88% 90% 96%  Weight:      Height:        Intake/Output Summary (Last 24 hours) at 07/14/2019 1625 Last data filed at 07/14/2019 1500 Gross per 24 hour  Intake 0 ml  Output 1950 ml  Net -1950 ml   Filed Weights   07/11/19 0432 07/12/19 0500 07/13/19 0436  Weight: 68.2 kg 68 kg 71.9 kg    Examination:  General: No acute distress. Cardiovascular: Heart sounds show Uthman Mroczkowski regular rate, and rhythm.  Lungs: Distant breath sounds, no clear adventitious breath sounds with isolation scope, unlabored breathing Abdomen: Soft, nontender, nondistended  Neurological: Alert and oriented 3. Moves all extremities 4 . Cranial nerves II through XII grossly intact. Skin: Warm and dry. No rashes or lesions. Extremities: No clubbing or cyanosis. No edema.   Data Reviewed: I have personally reviewed following labs and imaging studies  CBC: Recent Labs  Lab 07/10/19 0242  07/11/19 0034 07/11/19 1416 07/12/19 0451 07/13/19 0512 07/14/19 0338  WBC 18.8*  --  16.1*  --  18.3* 19.6* 21.7*  NEUTROABS 17.6*  --  15.2*  --  16.7* 17.0* 18.5*  HGB 8.9*   < > 8.2* 8.7* 8.4* 8.8* 9.1*  HCT 27.7*   < > 25.8* 27.4* 26.5* 27.5* 28.0*  MCV 93.6  --  94.5  --  91.1 92.6 90.6  PLT 257  --  249  --  298 330 354   < > = values in this interval not displayed.   Basic Metabolic Panel: Recent Labs  Lab 07/10/19 0242 07/10/19 1426 07/11/19 0034 07/12/19 0451 07/13/19 0512 07/14/19 0338  NA 138 137 138 138 136 137  K 4.4 4.7 5.0 4.9 5.4* 4.6  CL 108 110 107 109 108 106  CO2 20* 16* 20* 19* 19* 20*  GLUCOSE 133* 189* 153* 151* 141* 190*  BUN 39* 43* 41* 50* 49* 54*  CREATININE 2.03* 1.60* 1.51* 1.24 1.16 1.22  CALCIUM 8.4* 7.8* 8.1* 8.3* 7.9* 8.1*  MG 2.2  --  2.3 2.0 1.8 1.8  PHOS 3.7  --  3.3 4.0 4.0 4.6   GFR: Estimated Creatinine Clearance: 45.2 mL/min (by C-G formula based on SCr of 1.22 mg/dL). Liver Function Tests: Recent Labs  Lab 07/10/19  1426 07/11/19 0034 07/12/19 0451 07/13/19 0512 07/14/19 0338  AST 48* 46* 44* 35 27  ALT 35 32 37 37 36  ALKPHOS 41 36* 36* 43 42  BILITOT 0.9 0.7 0.2* 0.6 0.5  PROT 5.5* 5.9* 5.5* 5.2* 5.2*  ALBUMIN 2.6* 2.6* 2.3* 2.3* 2.1*   No results for input(s): LIPASE, AMYLASE in the last 168 hours. No results for input(s): AMMONIA in the last 168 hours. Coagulation Profile: No results for input(s): INR, PROTIME in the last 168 hours. Cardiac Enzymes: No results for input(s): CKTOTAL, CKMB, CKMBINDEX, TROPONINI in the last 168 hours. BNP (last 3 results) No results for input(s): PROBNP in the last 8760 hours. HbA1C: No results for input(s): HGBA1C in the last 72 hours. CBG: Recent Labs  Lab 07/10/19 1547 07/10/19 2051  GLUCAP 156* 161*   Lipid Profile: No results for input(s): CHOL, HDL, LDLCALC, TRIG, CHOLHDL, LDLDIRECT in the last 72 hours. Thyroid Function Tests: No results for input(s): TSH, T4TOTAL, FREET4, T3FREE, THYROIDAB in the last 72 hours. Anemia Panel: Recent Labs    07/11/19 1745  07/13/19 0512 07/14/19 0338  VITAMINB12 799  --   --   --   FOLATE 18.4  --   --   --   FERRITIN  --    < > 337* 285  TIBC 189*  --   --   --   IRON 20*  --   --   --    < > = values in this interval not displayed.   Sepsis Labs: Recent Labs  Lab 07/10/19 0242 07/11/19 0848 07/12/19 0451 07/13/19 0512  PROCALCITON 0.59 0.56 0.38 0.22    Recent Results (from the past 240 hour(s))  Culture, blood (Routine X 2) w Reflex to ID Panel     Status: None (Preliminary result)   Collection Time: 07/10/19  2:43 AM   Specimen: BLOOD  Result Value Ref Range Status   Specimen Description BLOOD LEFT ANTECUBITAL  Final   Special Requests   Final    BOTTLES DRAWN AEROBIC AND ANAEROBIC Blood Culture results may not be optimal due to an inadequate volume of blood received in culture bottles   Culture   Final    NO GROWTH 4 DAYS Performed at [facility], [place] [address],  [place], [place] [address]    Report Status PENDING  Incomplete  Culture, blood (Routine X 2) w Reflex to ID Panel     Status: None (Preliminary result)   Collection Time: 07/10/19  2:44 AM   Specimen: BLOOD  Result Value Ref Range Status   Specimen Description BLOOD LEFT ANTECUBITAL  Final   Special Requests   Final    BOTTLES DRAWN AEROBIC AND ANAEROBIC Blood Culture results may not be optimal due to an inadequate volume of blood received in culture bottles   Culture   Final    NO GROWTH 4 DAYS Performed at [facility] [address], [place], [place] [address]    Report Status PENDING  Incomplete  MRSA PCR Screening     Status: None   Collection Time: 07/10/19  8:00 PM   Specimen: Nasopharyngeal  Result Value Ref Range Status   MRSA by PCR NEGATIVE NEGATIVE Final    Comment:        The GeneXpert MRSA Assay (FDA approved for NASAL specimens only), is one component of [facility] comprehensive MRSA colonization surveillance program. It is not intended to diagnose MRSA infection nor to guide or monitor treatment for MRSA infections. Performed at [facility], [place] [address] [place], [place] [address]          Radiology Studies: Dg Chest Port 1 View  Result Date: 07/13/2019 CLINICAL DATA:  Shortness of breath EXAM: PORTABLE CHEST 1 VIEW COMPARISON:  07/12/2019 FINDINGS: Diffuse bilateral interstitial and patchy alveolar airspace opacities involving the upper and lower lungs similar in appearance to the prior examination consistent with multilobar pneumonia including atypical viral pneumonia such as COVID-19. No pleural effusion or pneumothorax. Stable cardiomediastinal silhouette. Prior median sternotomy. No acute osseous abnormality. IMPRESSION: Diffuse bilateral interstitial and patchy alveolar airspace opacities involving the upper and lower lungs similar in appearance to the prior examination consistent with multilobar pneumonia including atypical viral  pneumonia such as COVID-19. Electronically Signed   By: Kathreen Devoid   On: 07/13/2019 14:07        Scheduled Meds: . sodium chloride   Intravenous Once  . amiodarone  200 mg Oral Daily  . amLODipine  2.5 mg Oral Daily  . clopidogrel  75 mg Oral QHS  . enoxaparin (LOVENOX) injection  40 mg Subcutaneous Q12H  . folic acid  1 mg Oral Daily  . furosemide  40 mg Intravenous Q6H  . levothyroxine  75 mcg Oral QAC breakfast  . LORazepam  0-4 mg Oral Q12H  . mouth rinse  15 mL Mouth Rinse BID  . methylPREDNISolone (SOLU-MEDROL) injection  40 mg Intravenous Q12H  . metoprolol tartrate  12.5 mg Oral BID  . multivitamin with minerals  1 tablet Oral Daily  . pantoprazole  40 mg Oral Daily  . pravastatin  40 mg Oral q1800  . sodium chloride flush  3 mL Intravenous Q12H  . tamsulosin  0.4 mg Oral Daily  . thiamine  100 mg Oral Daily   Or  . thiamine  100 mg Intravenous Daily   Continuous Infusions: . levofloxacin (LEVAQUIN) IV 750 mg (07/14/19 1519)  . remdesivir 100 mg in NS 250 mL Stopped (07/13/19 1730)     LOS: 4 days    Time spent: over 30 min   Fayrene Helper, MD [facility] Pager AMION  If 7PM-7AM, please contact night-coverage [email or web address] Password [facility] 07/14/2019, 4:25 PM

## 2019-07-14 NOTE — Telephone Encounter (Signed)
Patient's wife called wanting some advice. Mrs. Yurkovich stated that she is not complaining about [facility], but feels that her husband needs more personal help than he is getting. Mrs. Sergi stated that she knows that her husband's beeper went off for a long time the other day because the blood plasma tube slipped out of his vein and no one responded to the beep for a long time. Mrs. Trebing knows that Dr, Damita Dunnings does not work at the hospital, but was wondering if he thought that she should request a private duty nurse to be with her husband if the hospital would allow it? Patient's wife requested that Dr. Damita Dunnings call her to discuss some issues when he gets a minute.

## 2019-07-15 ENCOUNTER — Ambulatory Visit: Payer: Medicare Other | Admitting: Cardiovascular Disease

## 2019-07-15 LAB — CULTURE, BLOOD (ROUTINE X 2)
Culture: NO GROWTH
Culture: NO GROWTH

## 2019-07-15 LAB — CBC WITH DIFFERENTIAL/PLATELET
Abs Immature Granulocytes: 2.4 10*3/uL — ABNORMAL HIGH (ref 0.00–0.07)
Basophils Absolute: 0 10*3/uL (ref 0.0–0.1)
Basophils Relative: 0 %
Eosinophils Absolute: 0 10*3/uL (ref 0.0–0.5)
Eosinophils Relative: 0 %
HCT: 27.8 % — ABNORMAL LOW (ref 39.0–52.0)
Hemoglobin: 9.1 g/dL — ABNORMAL LOW (ref 13.0–17.0)
Immature Granulocytes: 10 %
Lymphocytes Relative: 7 %
Lymphs Abs: 1.7 10*3/uL (ref 0.7–4.0)
MCH: 29.4 pg (ref 26.0–34.0)
MCHC: 32.7 g/dL (ref 30.0–36.0)
MCV: 89.7 fL (ref 80.0–100.0)
Monocytes Absolute: 0.3 10*3/uL (ref 0.1–1.0)
Monocytes Relative: 1 %
Neutro Abs: 19.9 10*3/uL — ABNORMAL HIGH (ref 1.7–7.7)
Neutrophils Relative %: 82 %
Platelets: 390 10*3/uL (ref 150–400)
RBC: 3.1 MIL/uL — ABNORMAL LOW (ref 4.22–5.81)
RDW: 15.5 % (ref 11.5–15.5)
WBC: 24.4 10*3/uL — ABNORMAL HIGH (ref 4.0–10.5)
nRBC: 0.1 % (ref 0.0–0.2)

## 2019-07-15 LAB — GLUCOSE, CAPILLARY
Glucose-Capillary: 166 mg/dL — ABNORMAL HIGH (ref 70–99)
Glucose-Capillary: 226 mg/dL — ABNORMAL HIGH (ref 70–99)
Glucose-Capillary: 205 mg/dL — ABNORMAL HIGH (ref 70–99)
Glucose-Capillary: 214 mg/dL — ABNORMAL HIGH (ref 70–99)

## 2019-07-15 LAB — COMPREHENSIVE METABOLIC PANEL
ALT: 35 U/L (ref 0–44)
AST: 29 U/L (ref 15–41)
Albumin: 2.4 g/dL — ABNORMAL LOW (ref 3.5–5.0)
Alkaline Phosphatase: 42 U/L (ref 38–126)
Anion gap: 11 (ref 5–15)
BUN: 52 mg/dL — ABNORMAL HIGH (ref 8–23)
CO2: 25 mmol/L (ref 22–32)
Calcium: 8.4 mg/dL — ABNORMAL LOW (ref 8.9–10.3)
Chloride: 102 mmol/L (ref 98–111)
Creatinine, Ser: 1.28 mg/dL — ABNORMAL HIGH (ref 0.61–1.24)
GFR calc Af Amer: >60 mL/min (ref >60)
GFR calc non Af Amer: 53 mL/min — ABNORMAL LOW (ref >60)
Glucose, Bld: 182 mg/dL — ABNORMAL HIGH (ref 70–99)
Potassium: 4.4 mmol/L (ref 3.5–5.1)
Sodium: 138 mmol/L (ref 135–145)
Total Bilirubin: 0.6 mg/dL (ref 0.3–1.2)
Total Protein: 5.5 g/dL — ABNORMAL LOW (ref 6.5–8.1)

## 2019-07-15 LAB — FERRITIN: Ferritin: 247 ng/mL (ref 24–336)

## 2019-07-15 LAB — C-REACTIVE PROTEIN: CRP: 6.7 mg/dL — ABNORMAL HIGH (ref <1.0)

## 2019-07-15 LAB — BRAIN NATRIURETIC PEPTIDE: B Natriuretic Peptide: 146.9 pg/mL — ABNORMAL HIGH (ref 0.0–100.0)

## 2019-07-15 LAB — D-DIMER, QUANTITATIVE: D-Dimer, Quant: 1.4 ug/mL-FEU — ABNORMAL HIGH (ref 0.00–0.50)

## 2019-07-15 LAB — MAGNESIUM: Magnesium: 1.8 mg/dL (ref 1.7–2.4)

## 2019-07-15 MED ORDER — POLYETHYLENE GLYCOL 3350 17 G PO PACK
17.0000 g | PACK | Freq: Two times a day (BID) | ORAL | Status: DC
  Administered 2019-07-15 – 2019-07-19 (×5): 17 g via ORAL
  Filled 2019-07-15 (×7): qty 1

## 2019-07-15 MED ORDER — SODIUM CHLORIDE 0.9 % IV SOLN
100.0000 mg | Freq: Once | INTRAVENOUS | Status: AC
  Administered 2019-07-15: 11:00:00 100 mg via INTRAVENOUS
  Filled 2019-07-15: qty 20

## 2019-07-15 MED ORDER — FUROSEMIDE 10 MG/ML IJ SOLN
40.0000 mg | Freq: Four times a day (QID) | INTRAMUSCULAR | Status: AC
  Administered 2019-07-15 (×2): 40 mg via INTRAVENOUS
  Filled 2019-07-15 (×2): qty 4

## 2019-07-15 NOTE — TOC Progression Note (Signed)
Transition of Care [facility]) - Progression Note    Patient Details  Name: Ryan Pittman MRN: [id number] Date of Birth: Mar 19, 1941  [facility]) CM/SW Contact  Joaquin Courts, RN Phone Number: 07/15/2019, 10:18 AM  Clinical Narrative:    CM following for potential discharge needs. PT evaluation pending.    Expected Discharge Plan: Home/Self Care(pending PT/OT eval) Barriers to Discharge: Continued Medical Work up  Expected Discharge Plan and Services Expected Discharge Plan: Home/Self Care(pending PT/OT eval)   Discharge Planning Services: CM Consult   Living arrangements for the past 2 months: Single Family Home                                       Social Determinants of Health (SDOH) Interventions    Readmission Risk Interventions Readmission Risk Prevention Plan 07/15/2019  Transportation Screening Complete  Medication Review Press photographer) Complete  Some recent data might be hidden

## 2019-07-15 NOTE — Telephone Encounter (Signed)
Late entry.  I called his wife.  We talked about his situation and her situation.  My understanding is the inpatient team is doing the best they can to care for all of the patients, her husband included.  I do not think it makes sense to try to get an extra private duty nurse because of the current restrictions for admission to [facility] due to [place]- this would apply for both staff and patients.  She has been in contact with the inpatient team and also with her husband via phone, as well as the phone connection will allow.  I think this is the best option for now.  Admittedly this is a stressful situation for both the patient and Ms. Sass.  She acknowledged that and said she felt better after talking to me.  She will update me as needed.  I appreciate the help of all involved.

## 2019-07-15 NOTE — Evaluation (Signed)
Physical Therapy Evaluation Patient Details Name: Ryan Pittman MRN: [id number] DOB: 1941/02/21 Today's Date: 07/15/2019   History of Present Illness  78 y/o male w/ hx of PVD, PNA, HTN, HLD, GERD, dyspnea, CAD s/p CABG, CKD, cancer, arthritis, anemia. Dx w/ COVID 10/26  Clinical Impression   Pt admitted with above diagnosis. PTA was very independent and working in [employer]. Pt currently with functional limitations due to the deficits listed below (see PT Problem List).Pt is on 10L/min via HFNC stats fluctuate betweenlow to high 80s. Pt is needing SBA and cues to complete most functional mobility this am.  Pt will benefit from skilled PT to increase their independence and safety with mobility to allow discharge to the venue listed below.       Follow Up Recommendations No PT follow up    Equipment Recommendations  None recommended by PT    Recommendations for Other Services OT consult     Precautions / Restrictions Precautions Precautions: Fall Restrictions Weight Bearing Restrictions: No      Mobility  Bed Mobility Overal bed mobility: Needs Assistance Bed Mobility: Supine to Sit;Sit to Supine     Supine to sit: Supervision Sit to supine: Supervision      Transfers Overall transfer level: Needs assistance   Transfers: Sit to/from Stand Sit to Stand: Supervision            Ambulation/Gait Ambulation/Gait assistance: Supervision Gait Distance (Feet): 88 Feet Assistive device: None Gait Pattern/deviations: Step-through pattern        Stairs            Wheelchair Mobility    Modified Rankin (Stroke Patients Only)       Balance Overall balance assessment: Needs assistance Sitting-balance support: Feet unsupported Sitting balance-Leahy Scale: Good     Standing balance support: During functional activity Standing balance-Leahy Scale: Fair                               Pertinent Vitals/Pain      Home Living  Family/patient expects to be discharged to:: Private residence Living Arrangements: Spouse/significant other Available Help at Discharge: Family Type of Home: House                Prior Function Level of Independence: Independent               Hand Dominance        Extremity/Trunk Assessment   Upper Extremity Assessment Upper Extremity Assessment: Defer to OT evaluation    Lower Extremity Assessment Lower Extremity Assessment: Generalized weakness       Communication   Communication: No difficulties  Cognition Arousal/Alertness: Awake/alert Behavior During Therapy: WFL for tasks assessed/performed Overall Cognitive Status: Within Functional Limits for tasks assessed                                        General Comments      Exercises Other Exercises Other Exercises: initiated IS and flutter valve   Assessment/Plan    PT Assessment Patient needs continued PT services  PT Problem List Decreased strength;Decreased activity tolerance;Decreased balance;Decreased mobility;Decreased coordination;Decreased safety awareness       PT Treatment Interventions Stair training;Functional mobility training;Therapeutic activities;Therapeutic exercise;Balance training;Neuromuscular re-education;Patient/family education    PT Goals (Current goals can be found in the Care Plan section)  Acute Rehab PT Goals  Time For Goal Achievement: 07/29/19 Potential to Achieve Goals: Good    Frequency Min 3X/week   Barriers to discharge        Co-evaluation               AM-PAC PT "6 Clicks" Mobility  Outcome Measure Help needed turning from your back to your side while in a flat bed without using bedrails?: A Little Help needed moving from lying on your back to sitting on the side of a flat bed without using bedrails?: A Little Help needed moving to and from a bed to a chair (including a wheelchair)?: A Little Help needed standing up from a chair  using your arms (e.g., wheelchair or bedside chair)?: A Little Help needed to walk in hospital room?: A Little Help needed climbing 3-5 steps with a railing? : A Little 6 Click Score: 18    End of Session Equipment Utilized During Treatment: Oxygen Activity Tolerance: Patient tolerated treatment well Patient left: in chair;with call bell/phone within reach   PT Visit Diagnosis: Other abnormalities of gait and mobility (R26.89);Muscle weakness (generalized) (M62.81)    Time: [id number] PT Time Calculation (min) (ACUTE ONLY): 36 min   Charges:   PT Evaluation $PT Eval Moderate Complexity: 1 Mod PT Treatments $Therapeutic Exercise: 8-22 mins $Therapeutic Activity: 8-22 mins        Horald Chestnut, PT   Delford Field 07/15/2019, 10:54 AM

## 2019-07-15 NOTE — Progress Notes (Addendum)
PROGRESS NOTE    Ryan Pittman  [id number] DOB: Jun 30, 1941 DOA: 07/09/2019 PCP: Tonia Ghent, MD   Brief Narrative:  78 y.o.malewith medical history significant forcoronary artery diseasestatus-post CABG, HTN, hypothyroidism, and CKD III, diagnosed with COVID-19 on 10/26 and presenting with worsening cough.Patient reports he is diagnosed with COVID-19 on 06/30/2019, and has had progressively worsening cough despite using Tessalon at home, denies any chest pain, does not feel very short of breath, and has not noted any lower extremity swelling or tenderness. Patient reports loose stools for the past couple days without any abdominal pain, nausea, or vomiting. Reports that the stool has been very dark but wonders if it is due to his use of Pepto-Bismol.  ED Course:Upon arrival to the ED, patient is found to be febrile to 39.3 C, saturating mid-90's on room air, mildly tachypneic, and with stable BP.EKG features sinus rhythm and chest x-ray is notable for mild bilateral lower lobe infiltration. Chemistry panel features creatinine 1.91, up from 1.18 last month. CBC notable for leukocytosis to 16,000 and Alisha Burgo normocytic anemia with hemoglobin 9.1, down from 10.5 last month. Patient was treated with acetaminophen, albuterol, and Protonix in the ED.  He was admitted for COVID 19 pneumonia.  He was treated with steroids and remdesivir as well as antibiotics for possible bacterial pneumonia as well.  He was initially on RA and did not require supplemental O2 until 11/7.  He rapidly worsened on 11/8 and was given convalescent plasma on 11/8 which was repeated on 11/9.  Currently receiving lasix for diuresis and continued steroids.  Continues on 10 L [place].    Assessment & Plan:   Principal Problem:   Acute renal failure superimposed on stage 3 chronic kidney disease (HCC) Active Problems:   HTN (hypertension)   Hypothyroidism   CAD (coronary artery disease)   Pneumonia due to COVID-19  virus   Normocytic anemia   COVID-19 virus infection  2.COVID-19 pneumonia Possible Bacterial Pneumonia  Diarrhea likely 2/2 COVID 19 Infection -Presents with worsening cough after being diagnosed with COVID-19 on 10/26 -Pt noted to be ttachypneic, with positive CXR findings, not requiring supplemental O2 - Continues to be comfortable, but remains hypoxic requiring 10 L today - CXR 11/8 with diffuse bilateral interstitial and patchy alveolar airspace opacities involving the upper and lower lungs - convalescent plasma given 11/8 (only received partial treatment as lost IV)  - repeat convalescent plasma on 11/9 given partial treatment on 11/8 and pt still with significant O2 requirement (discussed with pt and wife) - Given concern for possible bacterial pneumonia at presentation with PCT 0.56 and high WBC count, probably not great candidate for actemra.   - follow BNP (elevated) -> continue lasix x2 doses today  - Remdesivir  - dexamethasone -> methylprednisone high dose - high dose DVT ppx given critical illness on 10 L [place] - discussed with nursing to encourage proning as able - elevated CRP and procalcitonin and WBC suggest possible bacterial pneumonia - continue levaquin (last dose 11/9) - Daily labs (CBC, CMP, CRP, d dimer, ferritin) - improving daily - diarrhea has improved - CXR with bilateral consolidative opacities - rads recommended f/u in 3-4 weeks  COVID-19 Labs  Recent Labs    07/13/19 0512 07/14/19 0338 07/15/19 0609  DDIMER 2.74* 1.61* 1.40*  FERRITIN 337* 285 247  CRP 13.9* 10.6* 6.7*    Lab Results  Component Value Date   SARSCOV2NAA Detected ([facility]ron Brakebill) 06/30/2019   [place] NEGATIVE 06/23/2019    1.Acute kidney  injury superimposed on CKD III -Presented with increased cough with recent diagnosis of covid -Pt since noted increased loose stools with decreased PO intake with resultant Cr up to 1.9 from baseline of 1.2 - to 1.28 today - follow with diuresis  -ARB on hold  3.Normocytic anemia; MGUS; ?melena -H/H stable - Still awaiting hemoccult, though lack of diarrhea/stools makes me less suspicious of active bleed at this time - suspect that dark stools related to pepto bismol  - Rectal exam with brown stool, negative hemoccult, nursing at bedside during exam - may need GI c/s pending hemoccult or if Hb continues trending down - iron panel, b12, folate -> iron deficiency anemia - consider iron infusion prior to d/c -> needs outpatient workup for this (consider inpatient GI c/s if positive guiac) - maintain active type and screen -resume plavix - PO PPI  4.CAD -Denies chest pain at this time -Given concerns of dark stools, resume plavix per above, continue beta-blockerand statin  5.Hypertension -BP appropriate -Holdinglosartan in light of AKI, continue Lopressor and Norvasc as tolerated(some bradycardia, continue to monitor, holding parameters for metop)  6.Hypothyroidism -Status-post thyroidectomy -Continue Synthroidas tolerated  7.Paroxysmal atrial fibrillation -Patient had rapid atrial fibrillation while still in hospital after his CABG, has been in SR on amiodarone and is not anticoagulated pta -Continueamiodaroneas tolerated  # Hyperglycemia  Prediabetes: A1c 6.1.  Start sensitive SSI and follow closely.  DVT prophylaxis: SCD Code Status: full  Family Communication: discussed with wife 11/10 Disposition Plan: pending further improvement   Consultants:   none  Procedures:   none  Antimicrobials:  Anti-infectives (From admission, onward)   Start     Dose/Rate Route Frequency Ordered Stop   07/15/19 0830  remdesivir 100 mg in sodium chloride 0.9 % 250 mL IVPB     100 mg 500 mL/hr over 30 Minutes Intravenous  Once 07/15/19 0717 07/15/19 1153   07/11/19 1600  remdesivir 100 mg in sodium chloride 0.9 % 250 mL IVPB     100 mg 500 mL/hr over 30 Minutes Intravenous Every 24 hours  07/10/19 1519 07/15/19 1559   07/10/19 1600  levofloxacin (LEVAQUIN) IVPB 750 mg     750 mg 100 mL/hr over 90 Minutes Intravenous Every 48 hours 07/10/19 1519 07/14/19 1649   07/10/19 1600  remdesivir 200 mg in sodium chloride 0.9 % 250 mL IVPB     200 mg 500 mL/hr over 30 Minutes Intravenous Once 07/10/19 1519 07/10/19 1830     Subjective: Denies complaints Notes SOB with exertion No other complaints  Objective: Vitals:   07/15/19 0500 07/15/19 0730 07/15/19 1214 07/15/19 1518  BP:  (!) 143/59 135/61 (!) 144/59  Pulse:  (!) 51 (!) 59 66  Resp:  17 18 20   Temp:  98.7 F (37.1 C) 98 F (36.7 C) 98 F (36.7 C)  TempSrc:  Oral Oral Oral  SpO2:  90% 95% 93%  Weight: 68.2 kg     Height:        Intake/Output Summary (Last 24 hours) at 07/15/2019 1631 Last data filed at 07/15/2019 1500 Gross per 24 hour  Intake 2525.01 ml  Output 2900 ml  Net -374.99 ml   Filed Weights   07/12/19 0500 07/13/19 0436 07/15/19 0500  Weight: 68 kg 71.9 kg 68.2 kg    Examination:  General: No acute distress. Cardiovascular: RRR Lungs: lungs clear to auscultation, no adventitious lung sounds appreciated with isolation scope Abdomen: Soft, nontender, nondistended Neurological: Alert and oriented 3. Moves all extremities 4.  Cranial nerves II through XII grossly intact. Skin: Warm and dry. No rashes or lesions. Extremities: No clubbing or cyanosis. No LEE, but he does have 1+ sacral edema.    Data Reviewed: I have personally reviewed following labs and imaging studies  CBC: Recent Labs  Lab 07/11/19 0034 07/11/19 1416 07/12/19 0451 07/13/19 0512 07/14/19 0338 07/15/19 0609  WBC 16.1*  --  18.3* 19.6* 21.7* 24.4*  NEUTROABS 15.2*  --  16.7* 17.0* 18.5* 19.9*  HGB 8.2* 8.7* 8.4* 8.8* 9.1* 9.1*  HCT 25.8* 27.4* 26.5* 27.5* 28.0* 27.8*  MCV 94.5  --  91.1 92.6 90.6 89.7  PLT 249  --  298 330 354 [id number]   Basic Metabolic Panel: Recent Labs  Lab 07/10/19 0242  07/11/19 0034  07/12/19 0451 07/13/19 0512 07/14/19 0338 07/15/19 0609  NA 138   < > 138 138 136 137 138  K 4.4   < > 5.0 4.9 5.4* 4.6 4.4  CL 108   < > 107 109 108 106 102  CO2 20*   < > 20* 19* 19* 20* 25  GLUCOSE 133*   < > 153* 151* 141* 190* 182*  BUN 39*   < > 41* 50* 49* 54* 52*  CREATININE 2.03*   < > 1.51* 1.24 1.16 1.22 1.28*  CALCIUM 8.4*   < > 8.1* 8.3* 7.9* 8.1* 8.4*  MG 2.2  --  2.3 2.0 1.8 1.8 1.8  PHOS 3.7  --  3.3 4.0 4.0 4.6  --    < > = values in this interval not displayed.   GFR: Estimated Creatinine Clearance: 43 mL/min (Juliocesar Blasius) (by C-G formula based on SCr of 1.28 mg/dL (H)). Liver Function Tests: Recent Labs  Lab 07/11/19 0034 07/12/19 0451 07/13/19 0512 07/14/19 0338 07/15/19 0609  AST 46* 44* 35 27 29  ALT 32 37 37 36 35  ALKPHOS 36* 36* 43 42 42  BILITOT 0.7 0.2* 0.6 0.5 0.6  PROT 5.9* 5.5* 5.2* 5.2* 5.5*  ALBUMIN 2.6* 2.3* 2.3* 2.1* 2.4*   No results for input(s): LIPASE, AMYLASE in the last 168 hours. No results for input(s): AMMONIA in the last 168 hours. Coagulation Profile: No results for input(s): INR, PROTIME in the last 168 hours. Cardiac Enzymes: No results for input(s): CKTOTAL, CKMB, CKMBINDEX, TROPONINI in the last 168 hours. BNP (last 3 results) No results for input(s): PROBNP in the last 8760 hours. HbA1C: Recent Labs    07/14/19 0830  HGBA1C 6.1*   CBG: Recent Labs  Lab 07/10/19 1547 07/10/19 2051 07/14/19 1955 07/15/19 0727 07/15/19 1110  GLUCAP 156* 161* 276* 166* 226*   Lipid Profile: No results for input(s): CHOL, HDL, LDLCALC, TRIG, CHOLHDL, LDLDIRECT in the last 72 hours. Thyroid Function Tests: No results for input(s): TSH, T4TOTAL, FREET4, T3FREE, THYROIDAB in the last 72 hours. Anemia Panel: Recent Labs    07/14/19 0338 07/15/19 0609  FERRITIN 285 247   Sepsis Labs: Recent Labs  Lab 07/10/19 0242 07/11/19 0848 07/12/19 0451 07/13/19 0512  PROCALCITON 0.59 0.56 0.38 0.22    Recent Results (from the past 240  hour(s))  Culture, blood (Routine X 2) w Reflex to ID Panel     Status: None   Collection Time: 07/10/19  2:43 AM   Specimen: BLOOD  Result Value Ref Range Status   Specimen Description BLOOD LEFT ANTECUBITAL  Final   Special Requests   Final    BOTTLES DRAWN AEROBIC AND ANAEROBIC Blood Culture results may not be optimal due  to an inadequate volume of blood received in culture bottles   Culture   Final    NO GROWTH 5 DAYS Performed at [facility], [place] [address], [place], [place] [address]    Report Status 07/15/2019 FINAL  Final  Culture, blood (Routine X 2) w Reflex to ID Panel     Status: None   Collection Time: 07/10/19  2:44 AM   Specimen: BLOOD  Result Value Ref Range Status   Specimen Description BLOOD LEFT ANTECUBITAL  Final   Special Requests   Final    BOTTLES DRAWN AEROBIC AND ANAEROBIC Blood Culture results may not be optimal due to an inadequate volume of blood received in culture bottles   Culture   Final    NO GROWTH 5 DAYS Performed at [facility], [place] [address], [place], [place] [address]    Report Status 07/15/2019 FINAL  Final  MRSA PCR Screening     Status: None   Collection Time: 07/10/19  8:00 PM   Specimen: Nasopharyngeal  Result Value Ref Range Status   MRSA by PCR NEGATIVE NEGATIVE Final    Comment:        The GeneXpert MRSA Assay (FDA approved for NASAL specimens only), is one component of Milly Goggins comprehensive MRSA colonization surveillance program. It is not intended to diagnose MRSA infection nor to guide or monitor treatment for MRSA infections. Performed at [facility], [place] [address] [place], [place] [address]          Radiology Studies: No results found.      Scheduled Meds: . sodium chloride   Intravenous Once  . amiodarone  200 mg Oral Daily  . amLODipine  2.5 mg Oral Daily  . clopidogrel  75 mg Oral QHS  . enoxaparin (LOVENOX) injection  40 mg Subcutaneous Q12H  . folic acid  1 mg Oral Daily   . insulin aspart  0-5 Units Subcutaneous QHS  . insulin aspart  0-9 Units Subcutaneous TID WC  . levothyroxine  75 mcg Oral QAC breakfast  . mouth rinse  15 mL Mouth Rinse BID  . methylPREDNISolone (SOLU-MEDROL) injection  40 mg Intravenous Q12H  . metoprolol tartrate  12.5 mg Oral BID  . multivitamin with minerals  1 tablet Oral Daily  . pantoprazole  40 mg Oral Daily  . polyethylene glycol  17 g Oral BID  . pravastatin  40 mg Oral q1800  . sodium chloride flush  3 mL Intravenous Q12H  . tamsulosin  0.4 mg Oral Daily  . thiamine  100 mg Oral Daily   Or  . thiamine  100 mg Intravenous Daily   Continuous Infusions:    LOS: 5 days    Time spent: over 30 min   Fayrene Helper, MD [facility] Pager [email or web address]  If 7PM-7AM, please contact night-coverage [email or web address] Password [email or web address] 07/15/2019, 4:31 PM

## 2019-07-16 DIAGNOSIS — J1289 Other viral pneumonia: Secondary | ICD-10-CM

## 2019-07-16 DIAGNOSIS — J9601 Acute respiratory failure with hypoxia: Secondary | ICD-10-CM

## 2019-07-16 DIAGNOSIS — D649 Anemia, unspecified: Secondary | ICD-10-CM

## 2019-07-16 DIAGNOSIS — U071 COVID-19: Principal | ICD-10-CM

## 2019-07-16 LAB — CBC WITH DIFFERENTIAL/PLATELET
Abs Immature Granulocytes: 2.1 10*3/uL — ABNORMAL HIGH (ref 0.00–0.07)
Band Neutrophils: 2 %
Basophils Absolute: 0 10*3/uL (ref 0.0–0.1)
Basophils Relative: 0 %
Eosinophils Absolute: 0 10*3/uL (ref 0.0–0.5)
Eosinophils Relative: 0 %
HCT: 28.2 % — ABNORMAL LOW (ref 39.0–52.0)
Hemoglobin: 9.1 g/dL — ABNORMAL LOW (ref 13.0–17.0)
Lymphocytes Relative: 3 %
Lymphs Abs: 0.8 10*3/uL (ref 0.7–4.0)
MCH: 29.5 pg (ref 26.0–34.0)
MCHC: 32.3 g/dL (ref 30.0–36.0)
MCV: 91.6 fL (ref 80.0–100.0)
Metamyelocytes Relative: 3 %
Monocytes Absolute: 0.5 10*3/uL (ref 0.1–1.0)
Monocytes Relative: 2 %
Myelocytes: 5 %
Neutro Abs: 22.8 10*3/uL — ABNORMAL HIGH (ref 1.7–7.7)
Neutrophils Relative %: 85 %
Platelets: 403 10*3/uL — ABNORMAL HIGH (ref 150–400)
RBC: 3.08 MIL/uL — ABNORMAL LOW (ref 4.22–5.81)
RDW: 15.7 % — ABNORMAL HIGH (ref 11.5–15.5)
WBC: 26.2 10*3/uL — ABNORMAL HIGH (ref 4.0–10.5)
nRBC: 0.1 % (ref 0.0–0.2)

## 2019-07-16 LAB — COMPREHENSIVE METABOLIC PANEL
ALT: 39 U/L (ref 0–44)
AST: 30 U/L (ref 15–41)
Albumin: 2.4 g/dL — ABNORMAL LOW (ref 3.5–5.0)
Alkaline Phosphatase: 43 U/L (ref 38–126)
Anion gap: 11 (ref 5–15)
BUN: 67 mg/dL — ABNORMAL HIGH (ref 8–23)
CO2: 26 mmol/L (ref 22–32)
Calcium: 8.3 mg/dL — ABNORMAL LOW (ref 8.9–10.3)
Chloride: 101 mmol/L (ref 98–111)
Creatinine, Ser: 1.52 mg/dL — ABNORMAL HIGH (ref 0.61–1.24)
GFR calc Af Amer: 49 mL/min — ABNORMAL LOW (ref >60)
GFR calc non Af Amer: 43 mL/min — ABNORMAL LOW (ref >60)
Glucose, Bld: 148 mg/dL — ABNORMAL HIGH (ref 70–99)
Potassium: 4.8 mmol/L (ref 3.5–5.1)
Sodium: 138 mmol/L (ref 135–145)
Total Bilirubin: 0.8 mg/dL (ref 0.3–1.2)
Total Protein: 5.6 g/dL — ABNORMAL LOW (ref 6.5–8.1)

## 2019-07-16 LAB — C-REACTIVE PROTEIN: CRP: 5.2 mg/dL — ABNORMAL HIGH (ref <1.0)

## 2019-07-16 LAB — GLUCOSE, CAPILLARY
Glucose-Capillary: 176 mg/dL — ABNORMAL HIGH (ref 70–99)
Glucose-Capillary: 320 mg/dL — ABNORMAL HIGH (ref 70–99)
Glucose-Capillary: 132 mg/dL — ABNORMAL HIGH (ref 70–99)

## 2019-07-16 LAB — BRAIN NATRIURETIC PEPTIDE: B Natriuretic Peptide: 105.2 pg/mL — ABNORMAL HIGH (ref 0.0–100.0)

## 2019-07-16 LAB — BPAM FFP
Blood Product Expiration Date: 202011102355
ISSUE DATE / TIME: 202011100024
Unit Type and Rh: 600

## 2019-07-16 LAB — PREPARE FRESH FROZEN PLASMA: Unit division: 0

## 2019-07-16 LAB — MAGNESIUM: Magnesium: 1.9 mg/dL (ref 1.7–2.4)

## 2019-07-16 LAB — FERRITIN: Ferritin: 287 ng/mL (ref 24–336)

## 2019-07-16 LAB — D-DIMER, QUANTITATIVE: D-Dimer, Quant: 1.51 ug/mL-FEU — ABNORMAL HIGH (ref 0.00–0.50)

## 2019-07-16 MED ORDER — ENOXAPARIN SODIUM 40 MG/0.4ML ~~LOC~~ SOLN
40.0000 mg | SUBCUTANEOUS | Status: DC
  Administered 2019-07-17 – 2019-07-19 (×3): 40 mg via SUBCUTANEOUS
  Filled 2019-07-16 (×3): qty 0.4

## 2019-07-16 NOTE — Progress Notes (Signed)
His ddimer has remained consistently <5. Ok to change his lovenox to 40mg  SQ qday per Dr. Curly Rim.  Onnie Boer, PharmD, BCIDP, AAHIVP, CPP Infectious Disease Pharmacist 07/16/2019 1:24 PM

## 2019-07-16 NOTE — Progress Notes (Signed)
Physical Therapy Treatment Patient Details Name: Ryan Pittman MRN: [id number] DOB: 23-Aug-1941 Today's Date: 07/16/2019    History of Present Illness 78 y/o male w/ hx of PVD, PNA, HTN, HLD, GERD, dyspnea, CAD s/p CABG, CKD, cancer, arthritis, anemia. Dx w/ COVID 10/26    PT Comments    Pt is making some progress with mobility, he reports has been attempting to lay prone but does better with sidelying. He is needing SBA with most mobility this pm, able to ambulate up to 112ft in room, pt remains on 6L/min via HFNC and desat to min of 83% w/ cues for pursed lip breathing able to recover very fast.    Follow Up Recommendations  No PT follow up     Equipment Recommendations  None recommended by PT    Recommendations for Other Services       Precautions / Restrictions Precautions Precautions: Fall Restrictions Weight Bearing Restrictions: No    Mobility  Bed Mobility Overal bed mobility: Needs Assistance                Transfers Overall transfer level: Needs assistance   Transfers: Sit to/from Stand Sit to Stand: Supervision            Ambulation/Gait Ambulation/Gait assistance: Supervision Gait Distance (Feet): 120 Feet Assistive device: None Gait Pattern/deviations: Step-through pattern;Wide base of support     General Gait Details: very slow states is apprehensive of tripping over lines etc   Stairs             Wheelchair Mobility    Modified Rankin (Stroke Patients Only)       Balance Overall balance assessment: Needs assistance Sitting-balance support: Feet unsupported Sitting balance-Leahy Scale: Good     Standing balance support: During functional activity Standing balance-Leahy Scale: Fair                              Cognition Arousal/Alertness: Awake/alert Behavior During Therapy: WFL for tasks assessed/performed Overall Cognitive Status: Within Functional Limits for tasks assessed                                 General Comments: Pt found sitting in recliner has just finished eating lunch and is agreeable to tx at this time      Exercises      General Comments General comments (skin integrity, edema, etc.): Pt on 6L/min via HFNC completed incentive spirometer and flutter valve, also ambulated approx 1 x 48ft and 1 x 143ft with SBA, cues and line management. noted lowest desat to 83% but with cues for pursed lip breathing was able to recover quite rapidly.      Pertinent Vitals/Pain Pain Assessment: Faces Faces Pain Scale: Hurts little more Pain Location: w/ coughing Pain Intervention(s): Limited activity within patient's tolerance    Home Living                      Prior Function            PT Goals (current goals can now be found in the care plan section) Acute Rehab PT Goals Time For Goal Achievement: 07/29/19 Potential to Achieve Goals: Good Progress towards PT goals: Progressing toward goals    Frequency    Min 3X/week      PT Plan Current plan remains appropriate    Co-evaluation  AM-PAC PT "6 Clicks" Mobility   Outcome Measure  Help needed turning from your back to your side while in a flat bed without using bedrails?: A Little Help needed moving from lying on your back to sitting on the side of a flat bed without using bedrails?: A Little Help needed moving to and from a bed to a chair (including a wheelchair)?: A Little Help needed standing up from a chair using your arms (e.g., wheelchair or bedside chair)?: A Little Help needed to walk in hospital room?: A Little Help needed climbing 3-5 steps with a railing? : A Little 6 Click Score: 18    End of Session Equipment Utilized During Treatment: Oxygen Activity Tolerance: Treatment limited secondary to medical complications (Comment);Patient tolerated treatment well Patient left: in chair;with call bell/phone within reach   PT Visit Diagnosis: Other abnormalities  of gait and mobility (R26.89);Muscle weakness (generalized) (M62.81)     Time: [id number] PT Time Calculation (min) (ACUTE ONLY): 16 min  Charges:  $Therapeutic Activity: 8-22 mins                     Horald Chestnut, PT    Delford Field 07/16/2019, 3:50 PM

## 2019-07-16 NOTE — Progress Notes (Signed)
Pt reports he did not get any dinner and that he has asked/called 5 times.    I checked his trash: breakfast and lunch tray in trash can.  I offered patient choice of 4 different meals.  Pt declined.

## 2019-07-16 NOTE — Progress Notes (Signed)
Inpatient Diabetes Program Recommendations  AACE/ADA: New Consensus Statement on Inpatient Glycemic Control   Target Ranges:  Prepandial:   less than 140 mg/dL      Peak postprandial:   less than 180 mg/dL (1-2 hours)      Critically ill patients:  140 - 180 mg/dL   Results for Ryan Pittman, EASTIN (MRN [id number]) as of 07/16/2019 13:56  Ref. Range 07/15/2019 07:27 07/15/2019 11:10 07/15/2019 16:56 07/15/2019 20:58 07/16/2019 07:51 07/16/2019 12:36  Glucose-Capillary Latest Ref Range: 70 - 99 mg/dL 166 (H) 226 (H) 205 (H) 214 (H) 176 (H) 320 (H)   Review of Glycemic Control  Diabetes history: No Outpatient Diabetes medications: NA Current orders for Inpatient glycemic control: Novolog 0-9 units TID with meals, Novolog 0-5 units QHS; Solumedrol 40 mg Q12H  Inpatient Diabetes Program Recommendations:   Insulin-Meal Coverage: If steroids are continued, please consider ordering Novolog 4 units TID with meals for meal coverage if patient eats at least 50% of meals.  Thanks, Barnie Alderman, RN, MSN, CDE Diabetes Coordinator Inpatient Diabetes Program [phone] (Team Pager from 8am to 5pm)

## 2019-07-16 NOTE — Telephone Encounter (Signed)
Spoke with pt wife who states she thought she contacted office to cancel 11/24 appt because pt admitted at [facility] d/t pneumonia and positive for COVID-19. She states when pt is discharged he may not be up for coming in for 11/24 appt. Attempted to advise of option for virtual appt or rescheduling of OV. Pt wife stated that [place] was on the other line and she would call back.

## 2019-07-16 NOTE — Progress Notes (Signed)
PROGRESS NOTE  Ryan Pittman [id number] DOB: [birth year] DOA: 07/09/2019  PCP: Tonia Ghent, MD  Brief History/Interval Summary: 78 year old Caucasian male with past medical history of coronary artery disease status post CABG, essential hypertension, hypothyroidism, chronic kidney disease stage III was diagnosed with COVID-19 on 10/26.  Presented with worsening shortness of breath and cough.  Was noted to be saturating in the mid 90s initially.  However he worsened on 11/8.  He remains hospitalized at the [facility].   Reason for Visit: Pneumonia due to COVID-19.  Acute respiratory failure with hypoxia  Consultants: None  Procedures: None  Antibiotics: Anti-infectives (From admission, onward)   Start     Dose/Rate Route Frequency Ordered Stop   07/15/19 0830  remdesivir 100 mg in sodium chloride 0.9 % 250 mL IVPB     100 mg 500 mL/hr over 30 Minutes Intravenous  Once 07/15/19 0717 07/16/19 0740   07/11/19 1600  remdesivir 100 mg in sodium chloride 0.9 % 250 mL IVPB     100 mg 500 mL/hr over 30 Minutes Intravenous Every 24 hours 07/10/19 1519 07/15/19 1559   07/10/19 1600  levofloxacin (LEVAQUIN) IVPB 750 mg     750 mg 100 mL/hr over 90 Minutes Intravenous Every 48 hours 07/10/19 1519 07/14/19 1649   07/10/19 1600  remdesivir 200 mg in sodium chloride 0.9 % 250 mL IVPB     200 mg 500 mL/hr over 30 Minutes Intravenous Once 07/10/19 1519 07/10/19 1830      Subjective/Interval History: Patient states that he is feeling slightly better this morning.  Still gets short of breath with minimal exertion.  Occasional cough with clear expectoration.  No chest pain.  No nausea vomiting.   Assessment/Plan:  Acute Hypoxic Resp. Failure/Pneumonia due to COVID-19  COVID-19 Labs  Recent Labs    07/14/19 0338 07/15/19 0609 07/16/19 0225  DDIMER 1.61* 1.40* 1.51*  FERRITIN 285 247 287  CRP 10.6* 6.7* 5.2*    Lab Results  Component Value Date   SARSCOV2NAA  Detected (A) 06/30/2019   [place] NEGATIVE 06/23/2019     Fever: Remains afebrile Oxygen requirements: Currently on 8 L HFNC.  Saturating in the early 90s Antibacterials: Has been on levofloxacin Remdesivir: Completed course Steroids: Solu-Medrol 40 mg twice a day Diuretics: Not on scheduled diuretics Actemra: Did not receive Convalescent plasma: Had IV issues with the first unit on 11/8.  So he was given additional unit on 11/9.   DVT Prophylaxis:  Lovenox 40 mg every 12 hours  Patient still remains tenuous from a respiratory standpoint although he appears to be improving slowly.  His oxygen requirements have gone down today to 8 L.  He has received remdesivir, steroids and convalescent plasma.  He was not given Actemra due to concern for secondary bacterial infection for which he was placed on antibiotics.  He appears to have completed course of levofloxacin.  His procalcitonin was noted to be elevated.  Patient's inflammatory markers have improved.  CRP is down to 5.2.  Ferritin remains normal at 287.  D-dimer 1.51.  Continue prone positioning, incentive spirometry and mobilization.  Leukocytosis is likely due to steroids.  Acute kidney injury superimposed on chronic kidney disease stage III Baseline creatinine is around 1.3-1.5.  He did have a creatinine of 2 during the course of this hospitalization.  Has improved subsequently.  Continue to monitor urine output.  ARB is on hold.  Normocytic anemia Hemoglobin is stable.  No evidence of overt bleeding.  There  was concern for dark-colored stool however he was noted to be on Pepto-Bismol.  Hemoglobin has been stable.  No concern for GI bleed at this time.  Continue PPI.  Coronary artery disease status post CABG No chest pain.  Seems to be stable.  Continue to monitor.  Essential hypertension Continue to monitor closely.  Continue current medications including metoprolol and amlodipine.  Losartan is on hold due to renal insufficiency.   Hypothyroidism Status post thyroidectomy.  Continue levothyroxine.  History of paroxysmal atrial fibrillation Noted to be on amiodarone.  Not anticoagulated.  Apparently this occurred after his CABG.  Hyperglycemia/prediabetes HbA1c 6.1.  Continue SSI.   DVT Prophylaxis: Lovenox PUD Prophylaxis: On Protonix Code Status: Full code Family Communication: We will discuss with family Disposition Plan: Continue current management for now.  Mobilization.   Medications:  Scheduled: . sodium chloride   Intravenous Once  . amiodarone  200 mg Oral Daily  . amLODipine  2.5 mg Oral Daily  . clopidogrel  75 mg Oral QHS  . enoxaparin (LOVENOX) injection  40 mg Subcutaneous Q12H  . folic acid  1 mg Oral Daily  . insulin aspart  0-5 Units Subcutaneous QHS  . insulin aspart  0-9 Units Subcutaneous TID WC  . levothyroxine  75 mcg Oral QAC breakfast  . mouth rinse  15 mL Mouth Rinse BID  . methylPREDNISolone (SOLU-MEDROL) injection  40 mg Intravenous Q12H  . metoprolol tartrate  12.5 mg Oral BID  . multivitamin with minerals  1 tablet Oral Daily  . pantoprazole  40 mg Oral Daily  . polyethylene glycol  17 g Oral BID  . pravastatin  40 mg Oral q1800  . sodium chloride flush  3 mL Intravenous Q12H  . tamsulosin  0.4 mg Oral Daily  . thiamine  100 mg Oral Daily   Or  . thiamine  100 mg Intravenous Daily   Continuous:  [id number], albuterol, chlorpheniramine-HYDROcodone, guaiFENesin-dextromethorphan, HYDROcodone-acetaminophen, ondansetron **OR** ondansetron (ZOFRAN) IV, traZODone   Objective:  Vital Signs  Vitals:   07/15/19 1518 07/16/19 0600 07/16/19 0800 07/16/19 1000  BP: (!) 144/59 (!) 126/56 (!) 123/52 114/89  Pulse: 66  71 67  Resp: 20  18 19   Temp: 98 F (36.7 C) 97.9 F (36.6 C) 98.2 F (36.8 C)   TempSrc: Oral Oral Oral   SpO2: 93%  94% 93%  Weight:      Height:        Intake/Output Summary (Last 24 hours) at 07/16/2019 1018 Last data filed at 07/16/2019  0959 Gross per 24 hour  Intake 1400 ml  Output 2150 ml  Net -750 ml   Filed Weights   07/12/19 0500 07/13/19 0436 07/15/19 0500  Weight: 68 kg 71.9 kg 68.2 kg    General appearance: Awake alert.  In no distress Resp: Tachypneic at rest.  Coarse breath sound bilaterally with crackles at the bases bilaterally.  No wheezing or rhonchi. Cardio: S1-S2 is normal regular.  No S3-S4.  No rubs murmurs or bruit GI: Abdomen is soft.  Nontender nondistended.  Bowel sounds are present normal.  No masses organomegaly Extremities: No edema.  Full range of motion of lower extremities. Neurologic: Alert and oriented x3.  No focal neurological deficits.    Lab Results:  Data Reviewed: I have personally reviewed following labs and imaging studies  CBC: Recent Labs  Lab 07/12/19 0451 07/13/19 0512 07/14/19 0338 07/15/19 0609 07/16/19 0225  WBC 18.3* 19.6* 21.7* 24.4* 26.2*  NEUTROABS 16.7* 17.0* 18.5* 19.9* 22.8*  HGB 8.4* 8.8* 9.1* 9.1* 9.1*  HCT 26.5* 27.5* 28.0* 27.8* 28.2*  MCV 91.1 92.6 90.6 89.7 91.6  PLT 298 330 354 390 403*    Basic Metabolic Panel: Recent Labs  Lab 07/10/19 0242  07/11/19 0034 07/12/19 0451 07/13/19 0512 07/14/19 0338 07/15/19 0609 07/16/19 0225  NA 138   < > 138 138 136 137 138 138  K 4.4   < > 5.0 4.9 5.4* 4.6 4.4 4.8  CL 108   < > 107 109 108 106 102 101  CO2 20*   < > 20* 19* 19* 20* 25 26  GLUCOSE 133*   < > 153* 151* 141* 190* 182* 148*  BUN 39*   < > 41* 50* 49* 54* 52* 67*  CREATININE 2.03*   < > 1.51* 1.24 1.16 1.22 1.28* 1.52*  CALCIUM 8.4*   < > 8.1* 8.3* 7.9* 8.1* 8.4* 8.3*  MG 2.2  --  2.3 2.0 1.8 1.8 1.8 1.9  PHOS 3.7  --  3.3 4.0 4.0 4.6  --   --    < > = values in this interval not displayed.    GFR: Estimated Creatinine Clearance: 36.2 mL/min (A) (by C-G formula based on SCr of 1.52 mg/dL (H)).  Liver Function Tests: Recent Labs  Lab 07/12/19 0451 07/13/19 0512 07/14/19 0338 07/15/19 0609 07/16/19 0225  AST 44* 35 27 29  30   ALT 37 37 36 35 39  ALKPHOS 36* 43 42 42 43  BILITOT 0.2* 0.6 0.5 0.6 0.8  PROT 5.5* 5.2* 5.2* 5.5* 5.6*  ALBUMIN 2.3* 2.3* 2.1* 2.4* 2.4*    HbA1C: Recent Labs    07/14/19 0830  HGBA1C 6.1*    CBG: Recent Labs  Lab 07/15/19 0727 07/15/19 1110 07/15/19 1656 07/15/19 2058 07/16/19 0751  GLUCAP 166* 226* 205* 214* 176*    Anemia Panel: Recent Labs    07/15/19 0609 07/16/19 0225  FERRITIN 247 287    Recent Results (from the past 240 hour(s))  Culture, blood (Routine X 2) w Reflex to ID Panel     Status: None   Collection Time: 07/10/19  2:43 AM   Specimen: BLOOD  Result Value Ref Range Status   Specimen Description BLOOD LEFT ANTECUBITAL  Final   Special Requests   Final    BOTTLES DRAWN AEROBIC AND ANAEROBIC Blood Culture results may not be optimal due to an inadequate volume of blood received in culture bottles   Culture   Final    NO GROWTH 5 DAYS Performed at [facility], [place] [address] [place], [place] [address]    Report Status 07/15/2019 FINAL  Final  Culture, blood (Routine X 2) w Reflex to ID Panel     Status: None   Collection Time: 07/10/19  2:44 AM   Specimen: BLOOD  Result Value Ref Range Status   Specimen Description BLOOD LEFT ANTECUBITAL  Final   Special Requests   Final    BOTTLES DRAWN AEROBIC AND ANAEROBIC Blood Culture results may not be optimal due to an inadequate volume of blood received in culture bottles   Culture   Final    NO GROWTH 5 DAYS Performed at [facility], [place] [address], [place], [place] [address]    Report Status 07/15/2019 FINAL  Final  MRSA PCR Screening     Status: None   Collection Time: 07/10/19  8:00 PM   Specimen: Nasopharyngeal  Result Value Ref Range Status   MRSA by PCR  NEGATIVE NEGATIVE Final    Comment:        The GeneXpert MRSA Assay (FDA approved for NASAL specimens only), is one component of a comprehensive MRSA colonization surveillance program. It is not intended to diagnose  MRSA infection nor to guide or monitor treatment for MRSA infections. Performed at [facility], [place] [address] [place], [place] [address]       Radiology Studies: No results found.     LOS: 6 days   Donnette Macmullen [employer] on [email or web address]  07/16/2019, 10:18 AM

## 2019-07-16 NOTE — Progress Notes (Signed)
   07/16/19 1527  Family/Significant Other Communication  Family/Significant Other Update Called;Updated;Other (Comment) [name], wife / [phone])

## 2019-07-17 DIAGNOSIS — I10 Essential (primary) hypertension: Secondary | ICD-10-CM

## 2019-07-17 LAB — COMPREHENSIVE METABOLIC PANEL
ALT: 39 U/L (ref 0–44)
AST: 30 U/L (ref 15–41)
Albumin: 2.2 g/dL — ABNORMAL LOW (ref 3.5–5.0)
Alkaline Phosphatase: 39 U/L (ref 38–126)
Anion gap: 11 (ref 5–15)
BUN: 78 mg/dL — ABNORMAL HIGH (ref 8–23)
CO2: 24 mmol/L (ref 22–32)
Calcium: 7.7 mg/dL — ABNORMAL LOW (ref 8.9–10.3)
Chloride: 95 mmol/L — ABNORMAL LOW (ref 98–111)
Creatinine, Ser: 1.56 mg/dL — ABNORMAL HIGH (ref 0.61–1.24)
GFR calc Af Amer: 48 mL/min — ABNORMAL LOW (ref >60)
GFR calc non Af Amer: 41 mL/min — ABNORMAL LOW (ref >60)
Glucose, Bld: 114 mg/dL — ABNORMAL HIGH (ref 70–99)
Potassium: 4.3 mmol/L (ref 3.5–5.1)
Sodium: 130 mmol/L — ABNORMAL LOW (ref 135–145)
Total Bilirubin: 0.6 mg/dL (ref 0.3–1.2)
Total Protein: 5.2 g/dL — ABNORMAL LOW (ref 6.5–8.1)

## 2019-07-17 LAB — CBC
HCT: 27.7 % — ABNORMAL LOW (ref 39.0–52.0)
Hemoglobin: 9.2 g/dL — ABNORMAL LOW (ref 13.0–17.0)
MCH: 30.2 pg (ref 26.0–34.0)
MCHC: 33.2 g/dL (ref 30.0–36.0)
MCV: 90.8 fL (ref 80.0–100.0)
Platelets: 404 10*3/uL — ABNORMAL HIGH (ref 150–400)
RBC: 3.05 MIL/uL — ABNORMAL LOW (ref 4.22–5.81)
RDW: 15.8 % — ABNORMAL HIGH (ref 11.5–15.5)
WBC: 26.5 10*3/uL — ABNORMAL HIGH (ref 4.0–10.5)
nRBC: 0.1 % (ref 0.0–0.2)

## 2019-07-17 LAB — GLUCOSE, CAPILLARY
Glucose-Capillary: 282 mg/dL — ABNORMAL HIGH (ref 70–99)
Glucose-Capillary: 153 mg/dL — ABNORMAL HIGH (ref 70–99)
Glucose-Capillary: 366 mg/dL — ABNORMAL HIGH (ref 70–99)
Glucose-Capillary: 314 mg/dL — ABNORMAL HIGH (ref 70–99)
Glucose-Capillary: 264 mg/dL — ABNORMAL HIGH (ref 70–99)

## 2019-07-17 LAB — D-DIMER, QUANTITATIVE: D-Dimer, Quant: 1.39 ug/mL-FEU — ABNORMAL HIGH (ref 0.00–0.50)

## 2019-07-17 LAB — C-REACTIVE PROTEIN: CRP: 3.5 mg/dL — ABNORMAL HIGH (ref <1.0)

## 2019-07-17 MED ORDER — METHYLPREDNISOLONE SODIUM SUCC 40 MG IJ SOLR
40.0000 mg | INTRAMUSCULAR | Status: DC
  Administered 2019-07-18: 40 mg via INTRAVENOUS
  Filled 2019-07-17: qty 1

## 2019-07-17 NOTE — Progress Notes (Signed)
Pt sodium level of 130 noted to Dr. [place] Pink. No s/s of hyponatremia.

## 2019-07-17 NOTE — Progress Notes (Signed)
   07/17/19 1800  Family/Significant Other Communication  Family/Significant Other Update Called;Updated;Other (Comment) [name], wife / [phone])

## 2019-07-17 NOTE — Progress Notes (Signed)
PROGRESS NOTE  Ryan Pittman [id number] DOB: 12-08-1940 DOA: 07/09/2019  PCP: Tonia Ghent, MD  Brief History/Interval Summary: 78 year old Caucasian male with past medical history of coronary artery disease status post CABG, essential hypertension, hypothyroidism, chronic kidney disease stage III was diagnosed with COVID-19 on 10/26.  Presented with worsening shortness of breath and cough.  Was noted to be saturating in the mid 90s initially.  However he worsened on 11/8.  He remains hospitalized at the [facility].   Reason for Visit: Pneumonia due to COVID-19.  Acute respiratory failure with hypoxia  Consultants: None  Procedures: None  Antibiotics: Anti-infectives (From admission, onward)   Start     Dose/Rate Route Frequency Ordered Stop   07/15/19 0830  remdesivir 100 mg in sodium chloride 0.9 % 250 mL IVPB     100 mg 500 mL/hr over 30 Minutes Intravenous  Once 07/15/19 0717 07/16/19 0740   07/11/19 1600  remdesivir 100 mg in sodium chloride 0.9 % 250 mL IVPB     100 mg 500 mL/hr over 30 Minutes Intravenous Every 24 hours 07/10/19 1519 07/15/19 1559   07/10/19 1600  levofloxacin (LEVAQUIN) IVPB 750 mg     750 mg 100 mL/hr over 90 Minutes Intravenous Every 48 hours 07/10/19 1519 07/14/19 1649   07/10/19 1600  remdesivir 200 mg in sodium chloride 0.9 % 250 mL IVPB     200 mg 500 mL/hr over 30 Minutes Intravenous Once 07/10/19 1519 07/10/19 1830      Subjective/Interval History: Patient states that he is feeling better.  Shortness of breath is improving.  Cough is getting better.  No chest pain.  No nausea vomiting.   Assessment/Plan:  Acute Hypoxic Resp. Failure/Pneumonia due to COVID-19  COVID-19 Labs  Recent Labs    07/15/19 0609 07/16/19 0225 07/17/19 0005  DDIMER 1.40* 1.51* 1.39*  FERRITIN 247 287  --   CRP 6.7* 5.2* 3.5*    Lab Results  Component Value Date   SARSCOV2NAA Detected (A) 06/30/2019   SARSCOV2NAA NEGATIVE 06/23/2019     Fever: Remains afebrile Oxygen requirements: Still on high flow nasal cannula.  Noted to be on 3 L at this morning.  Saturating in the early 90s.   Antibacterials: Has been on levofloxacin Remdesivir: Completed course Steroids: Solu-Medrol 40 mg twice a day.  We will start tapering steroids Diuretics: Not on scheduled diuretics Actemra: Did not receive Convalescent plasma: Had IV issues with the first unit on 11/8.  So he was given additional unit on 11/9.   DVT Prophylaxis:  Lovenox 40 mg every 12 hours  Patient seems to be improving from a respiratory standpoint.  His oxygen requirements have improved over the last 24 to 48 hours.  He has completed course of remdesivir.  He has received convalescent plasma.  Was not given Actemra due to elevated procalcitonin.  It also appears that he has completed course of levofloxacin.  Patient's inflammatory markers have improved.  CRP is down to 3.5.  D-dimer 1.39.  Could start tapering steroids.  Continue incentive spirometry mobilization.  Leukocytosis is most likely due to steroids.  Acute kidney injury superimposed on chronic kidney disease stage III/hyponatremia Baseline creatinine is around 1.3-1.5.  He did have a creatinine of 2 during the course of this hospitalization.  Pain is stable for the most part.  Monitor urine output.  Sodium noted to be low today.  Reason is not entirely clear.  ARB is on hold.  Recheck labs tomorrow.  Normocytic anemia Hemoglobin is stable.  No evidence of overt bleeding.  There was concern for dark-colored stool however he was noted to be on Pepto-Bismol.  Hemoglobin has been stable.  No concern for GI bleed at this time.  Continue PPI.  Coronary artery disease status post CABG No chest pain.  Seems to be stable.  Continue to monitor.  Essential hypertension Blood pressure is reasonably well controlled.  Continue metoprolol and amlodipine.  Losartan is on hold.    Hypothyroidism Status post thyroidectomy.   Continue levothyroxine.  History of paroxysmal atrial fibrillation Noted to be on amiodarone.  Not anticoagulated.  Apparently this occurred after his CABG.  Hyperglycemia/prediabetes HbA1c 6.1.  Continue SSI.  History of alcohol use Noted to be on CIWA protocol.  No evidence for withdrawal symptoms currently.   DVT Prophylaxis: Lovenox PUD Prophylaxis: On Protonix Code Status: Full code Family Communication: We will discuss with family Disposition Plan: Continue current management for now.  Mobilization.   Medications:  Scheduled: . sodium chloride   Intravenous Once  . amiodarone  200 mg Oral Daily  . amLODipine  2.5 mg Oral Daily  . clopidogrel  75 mg Oral QHS  . enoxaparin (LOVENOX) injection  40 mg Subcutaneous Q24H  . folic acid  1 mg Oral Daily  . insulin aspart  0-5 Units Subcutaneous QHS  . insulin aspart  0-9 Units Subcutaneous TID WC  . levothyroxine  75 mcg Oral QAC breakfast  . mouth rinse  15 mL Mouth Rinse BID  . methylPREDNISolone (SOLU-MEDROL) injection  40 mg Intravenous Q12H  . metoprolol tartrate  12.5 mg Oral BID  . multivitamin with minerals  1 tablet Oral Daily  . pantoprazole  40 mg Oral Daily  . polyethylene glycol  17 g Oral BID  . pravastatin  40 mg Oral q1800  . sodium chloride flush  3 mL Intravenous Q12H  . tamsulosin  0.4 mg Oral Daily  . thiamine  100 mg Oral Daily   Or  . thiamine  100 mg Intravenous Daily   Continuous:  [id number], albuterol, chlorpheniramine-HYDROcodone, guaiFENesin-dextromethorphan, HYDROcodone-acetaminophen, ondansetron **OR** ondansetron (ZOFRAN) IV, traZODone   Objective:  Vital Signs  Vitals:   07/17/19 0300 07/17/19 0500 07/17/19 0832 07/17/19 0837  BP: (!) 111/57  (!) 137/58   Pulse: (!) 55  (!) 56   Resp: 14  16   Temp: 97.8 F (36.6 C)  98.7 F (37.1 C)   TempSrc: Oral  Oral   SpO2: 94%  (!) 88% 95%  Weight:  67.7 kg    Height:        Intake/Output Summary (Last 24 hours) at  07/17/2019 0952 Last data filed at 07/17/2019 0835 Gross per 24 hour  Intake 1680 ml  Output 600 ml  Net 1080 ml   Filed Weights   07/13/19 0436 07/15/19 0500 07/17/19 0500  Weight: 71.9 kg 68.2 kg 67.7 kg    General appearance: Awake alert.  In no distress Resp: Less tachypneic today compared to yesterday.  Much more comfortable.  Coarse breath sounds bilaterally.  Crackles at the bases.  No wheezing or rhonchi.   Cardio: S1-S2 is normal regular.  No S3-S4.  No rubs murmurs or bruit GI: Abdomen is soft.  Nontender nondistended.  Bowel sounds are present normal.  No masses organomegaly Extremities: No edema.  Full range of motion of lower extremities. Neurologic: Alert and oriented x3.  No focal neurological deficits.      Lab Results:  Data Reviewed: I  have personally reviewed following labs and imaging studies  CBC: Recent Labs  Lab 07/12/19 0451 07/13/19 0512 07/14/19 0338 07/15/19 0609 07/16/19 0225 07/17/19 0005  WBC 18.3* 19.6* 21.7* 24.4* 26.2* 26.5*  NEUTROABS 16.7* 17.0* 18.5* 19.9* 22.8*  --   HGB 8.4* 8.8* 9.1* 9.1* 9.1* 9.2*  HCT 26.5* 27.5* 28.0* 27.8* 28.2* 27.7*  MCV 91.1 92.6 90.6 89.7 91.6 90.8  PLT 298 330 354 390 403* 404*    Basic Metabolic Panel: Recent Labs  Lab 07/11/19 0034 07/12/19 0451 07/13/19 0512 07/14/19 0338 07/15/19 0609 07/16/19 0225 07/17/19 0005  NA 138 138 136 137 138 138 130*  K 5.0 4.9 5.4* 4.6 4.4 4.8 4.3  CL 107 109 108 106 102 101 95*  CO2 20* 19* 19* 20* 25 26 24   GLUCOSE 153* 151* 141* 190* 182* 148* 114*  BUN 41* 50* 49* 54* 52* 67* 78*  CREATININE 1.51* 1.24 1.16 1.22 1.28* 1.52* 1.56*  CALCIUM 8.1* 8.3* 7.9* 8.1* 8.4* 8.3* 7.7*  MG 2.3 2.0 1.8 1.8 1.8 1.9  --   PHOS 3.3 4.0 4.0 4.6  --   --   --     GFR: Estimated Creatinine Clearance: 35.3 mL/min (A) (by C-G formula based on SCr of 1.56 mg/dL (H)).  Liver Function Tests: Recent Labs  Lab 07/13/19 0512 07/14/19 0338 07/15/19 0609 07/16/19 0225  07/17/19 0005  AST 35 27 29 30 30   ALT 37 36 35 39 39  ALKPHOS 43 42 42 43 39  BILITOT 0.6 0.5 0.6 0.8 0.6  PROT 5.2* 5.2* 5.5* 5.6* 5.2*  ALBUMIN 2.3* 2.1* 2.4* 2.4* 2.2*    HbA1C: No results for input(s): HGBA1C in the last 72 hours.  CBG: Recent Labs  Lab 07/15/19 2058 07/16/19 0751 07/16/19 1236 07/16/19 2246 07/17/19 0833  GLUCAP 214* 176* 320* 132* 153*    Anemia Panel: Recent Labs    07/15/19 0609 07/16/19 0225  FERRITIN 247 287    Recent Results (from the past 240 hour(s))  Culture, blood (Routine X 2) w Reflex to ID Panel     Status: None   Collection Time: 07/10/19  2:43 AM   Specimen: BLOOD  Result Value Ref Range Status   Specimen Description BLOOD LEFT ANTECUBITAL  Final   Special Requests   Final    BOTTLES DRAWN AEROBIC AND ANAEROBIC Blood Culture results may not be optimal due to an inadequate volume of blood received in culture bottles   Culture   Final    NO GROWTH 5 DAYS Performed at [facility], [place] [address], [place], [place] [address]    Report Status 07/15/2019 FINAL  Final  Culture, blood (Routine X 2) w Reflex to ID Panel     Status: None   Collection Time: 07/10/19  2:44 AM   Specimen: BLOOD  Result Value Ref Range Status   Specimen Description BLOOD LEFT ANTECUBITAL  Final   Special Requests   Final    BOTTLES DRAWN AEROBIC AND ANAEROBIC Blood Culture results may not be optimal due to an inadequate volume of blood received in culture bottles   Culture   Final    NO GROWTH 5 DAYS Performed at [facility], [place] [address] [place], [place] [address]    Report Status 07/15/2019 FINAL  Final  MRSA PCR Screening     Status: None   Collection Time: 07/10/19  8:00 PM   Specimen: Nasopharyngeal  Result Value Ref Range Status   MRSA by  PCR NEGATIVE NEGATIVE Final    Comment:        The GeneXpert MRSA Assay (FDA approved for NASAL specimens only), is one component of a comprehensive MRSA colonization surveillance  program. It is not intended to diagnose MRSA infection nor to guide or monitor treatment for MRSA infections. Performed at [facility], [place] [address] [place], [place] [address]       Radiology Studies: No results found.     LOS: 7 days   Oval Moralez [employer] on [email or web address]  07/17/2019, 9:52 AM

## 2019-07-18 LAB — GLUCOSE, CAPILLARY
Glucose-Capillary: 83 mg/dL (ref 70–99)
Glucose-Capillary: 284 mg/dL — ABNORMAL HIGH (ref 70–99)
Glucose-Capillary: 281 mg/dL — ABNORMAL HIGH (ref 70–99)
Glucose-Capillary: 324 mg/dL — ABNORMAL HIGH (ref 70–99)
Glucose-Capillary: 543 mg/dL (ref 70–99)

## 2019-07-18 LAB — COMPREHENSIVE METABOLIC PANEL
ALT: 40 U/L (ref 0–44)
AST: 21 U/L (ref 15–41)
Albumin: 2.1 g/dL — ABNORMAL LOW (ref 3.5–5.0)
Alkaline Phosphatase: 36 U/L — ABNORMAL LOW (ref 38–126)
Anion gap: 9 (ref 5–15)
BUN: 83 mg/dL — ABNORMAL HIGH (ref 8–23)
CO2: 23 mmol/L (ref 22–32)
Calcium: 8 mg/dL — ABNORMAL LOW (ref 8.9–10.3)
Chloride: 104 mmol/L (ref 98–111)
Creatinine, Ser: 1.55 mg/dL — ABNORMAL HIGH (ref 0.61–1.24)
GFR calc Af Amer: 48 mL/min — ABNORMAL LOW (ref >60)
GFR calc non Af Amer: 42 mL/min — ABNORMAL LOW (ref >60)
Glucose, Bld: 110 mg/dL — ABNORMAL HIGH (ref 70–99)
Potassium: 4.9 mmol/L (ref 3.5–5.1)
Sodium: 136 mmol/L (ref 135–145)
Total Bilirubin: 0.4 mg/dL (ref 0.3–1.2)
Total Protein: 4.9 g/dL — ABNORMAL LOW (ref 6.5–8.1)

## 2019-07-18 LAB — CBC
HCT: 28.1 % — ABNORMAL LOW (ref 39.0–52.0)
Hemoglobin: 9.1 g/dL — ABNORMAL LOW (ref 13.0–17.0)
MCH: 29.4 pg (ref 26.0–34.0)
MCHC: 32.4 g/dL (ref 30.0–36.0)
MCV: 90.9 fL (ref 80.0–100.0)
Platelets: 382 10*3/uL (ref 150–400)
RBC: 3.09 MIL/uL — ABNORMAL LOW (ref 4.22–5.81)
RDW: 15.9 % — ABNORMAL HIGH (ref 11.5–15.5)
WBC: 25.4 10*3/uL — ABNORMAL HIGH (ref 4.0–10.5)
nRBC: 0.1 % (ref 0.0–0.2)

## 2019-07-18 LAB — D-DIMER, QUANTITATIVE: D-Dimer, Quant: 1.1 ug/mL-FEU — ABNORMAL HIGH (ref 0.00–0.50)

## 2019-07-18 LAB — C-REACTIVE PROTEIN: CRP: 2.1 mg/dL — ABNORMAL HIGH (ref <1.0)

## 2019-07-18 MED ORDER — INSULIN REGULAR(HUMAN) IN NACL 100-0.9 UT/100ML-% IV SOLN
INTRAVENOUS | Status: DC
  Filled 2019-07-18: qty 100

## 2019-07-18 MED ORDER — DEXTROSE 50 % IV SOLN: 25.0000 mL | INTRAVENOUS | Status: DC | PRN

## 2019-07-18 MED ORDER — INSULIN ASPART 100 UNIT/ML ~~LOC~~ SOLN
0.0000 [IU] | Freq: Three times a day (TID) | SUBCUTANEOUS | Status: DC
  Administered 2019-07-18: 8 [IU] via SUBCUTANEOUS

## 2019-07-18 MED ORDER — PREDNISONE 20 MG PO TABS
40.0000 mg | ORAL_TABLET | Freq: Every day | ORAL | Status: DC
  Administered 2019-07-19: 08:00:00 40 mg via ORAL
  Filled 2019-07-18: qty 2

## 2019-07-18 MED ORDER — INSULIN REGULAR BOLUS VIA INFUSION
0.0000 [IU] | Freq: Three times a day (TID) | INTRAVENOUS | Status: DC
  Filled 2019-07-18: qty 10

## 2019-07-18 MED ORDER — INSULIN ASPART 100 UNIT/ML ~~LOC~~ SOLN: 0.0000 [IU] | Freq: Every day | SUBCUTANEOUS | Status: DC

## 2019-07-18 MED ORDER — DEXTROSE-NACL 5-0.45 % IV SOLN: INTRAVENOUS | Status: DC

## 2019-07-18 MED ORDER — SODIUM CHLORIDE 0.9 % IV SOLN: INTRAVENOUS | Status: DC

## 2019-07-18 NOTE — Progress Notes (Addendum)
PROGRESS NOTE  Ryan Pittman [id number] DOB: [birth year] DOA: 07/09/2019  PCP: Tonia Ghent, MD  Brief History/Interval Summary: 78 year old Caucasian male with past medical history of coronary artery disease status post CABG, essential hypertension, hypothyroidism, chronic kidney disease stage III was diagnosed with COVID-19 on 10/26.  Presented with worsening shortness of breath and cough.  Was noted to be saturating in the mid 90s initially.  However he worsened on 11/8.  He remains hospitalized at the [facility].   Reason for Visit: Pneumonia due to COVID-19.  Acute respiratory failure with hypoxia  Consultants: None  Procedures: None  Antibiotics: Anti-infectives (From admission, onward)   Start     Dose/Rate Route Frequency Ordered Stop   07/15/19 0830  remdesivir 100 mg in sodium chloride 0.9 % 250 mL IVPB     100 mg 500 mL/hr over 30 Minutes Intravenous  Once 07/15/19 0717 07/16/19 0740   07/11/19 1600  remdesivir 100 mg in sodium chloride 0.9 % 250 mL IVPB     100 mg 500 mL/hr over 30 Minutes Intravenous Every 24 hours 07/10/19 1519 07/15/19 1559   07/10/19 1600  levofloxacin (LEVAQUIN) IVPB 750 mg     750 mg 100 mL/hr over 90 Minutes Intravenous Every 48 hours 07/10/19 1519 07/14/19 1649   07/10/19 1600  remdesivir 200 mg in sodium chloride 0.9 % 250 mL IVPB     200 mg 500 mL/hr over 30 Minutes Intravenous Once 07/10/19 1519 07/10/19 1830      Subjective/Interval History: States that he is feeling better.  Shortness of breath is improved.  Cough is better.  No chest pain.  No nausea vomiting.  Has been out of bed to chair.   Assessment/Plan:  Acute Hypoxic Resp. Failure/Pneumonia due to COVID-19  COVID-19 Labs  Recent Labs    07/16/19 0225 07/17/19 0005 07/18/19 0124  DDIMER 1.51* 1.39* 1.10*  FERRITIN 287  --   --   CRP 5.2* 3.5* 2.1*    Lab Results  Component Value Date   SARSCOV2NAA Detected (A) 06/30/2019   SARSCOV2NAA  NEGATIVE 06/23/2019     Fever: Remains afebrile Oxygen requirements: High flow nasal cannula.  3 L/min.  Saturating in the early 90s.    Antibacterials: Has been on levofloxacin Remdesivir: Completed course Steroids: Solu-Medrol 40 mg once a day Diuretics: Not on scheduled diuretics Actemra: Did not receive Convalescent plasma: Had IV issues with the first unit on 11/8.  So he was given additional unit on 11/9.   DVT Prophylaxis:  Lovenox 40 mg every 12 hours  Patient seems to be improving from a respiratory standpoint.  His oxygen requirements have been decreasing.  He has completed course of remdesivir.  He has received a convalescent plasma.  He is completed course of antibiotics.  CRP is down to 2.1.  D-dimer 1.10.  Tapering steroids.  Continue incentive spirometry and mobilization.  Leukocytosis is most likely due to steroids.  Anticipate discharge in 24 to 48 hours.  Acute kidney injury superimposed on chronic kidney disease stage III/hyponatremia Baseline creatinine is around 1.3-1.5.  He did have a creatinine of 2 during the course of this hospitalization.  Creatinine is stable for the most part.  Monitor urine output.  Sodium is normal today.  Yesterday's level was likely spurious.  ARB on hold.     Normocytic anemia Hemoglobin is stable.  No evidence of overt bleeding.  There was concern for dark-colored stool however he was noted to be on Pepto-Bismol.  Hemoglobin has been stable.  No concern for GI bleed at this time.  Continue PPI.  Coronary artery disease status post CABG No chest pain.  Seems to be stable.  Continue to monitor.  Essential hypertension Blood Pressure is reasonably well controlled..  Continue metoprolol and amlodipine.  Losartan is on hold.    Hypothyroidism Status post thyroidectomy.  Continue levothyroxine.  History of paroxysmal atrial fibrillation Noted to be on amiodarone.  Not anticoagulated.  Apparently this occurred after his CABG.   Hyperglycemia/prediabetes HbA1c 6.1.  Continue SSI.  History of alcohol use Noted to be on CIWA protocol.  No evidence for withdrawal symptoms currently.   DVT Prophylaxis: Lovenox PUD Prophylaxis: On Protonix Code Status: Full code Family Communication: Discussed with patient.  Wife being updated on a daily basis. Disposition Plan: Continue to wean down oxygen.  Anticipate discharge tomorrow or the day after.   Medications:  Scheduled: . sodium chloride   Intravenous Once  . amiodarone  200 mg Oral Daily  . amLODipine  2.5 mg Oral Daily  . clopidogrel  75 mg Oral QHS  . enoxaparin (LOVENOX) injection  40 mg Subcutaneous Q24H  . folic acid  1 mg Oral Daily  . insulin aspart  0-5 Units Subcutaneous QHS  . insulin aspart  0-9 Units Subcutaneous TID WC  . levothyroxine  75 mcg Oral QAC breakfast  . mouth rinse  15 mL Mouth Rinse BID  . methylPREDNISolone (SOLU-MEDROL) injection  40 mg Intravenous Q24H  . metoprolol tartrate  12.5 mg Oral BID  . multivitamin with minerals  1 tablet Oral Daily  . pantoprazole  40 mg Oral Daily  . polyethylene glycol  17 g Oral BID  . pravastatin  40 mg Oral q1800  . sodium chloride flush  3 mL Intravenous Q12H  . tamsulosin  0.4 mg Oral Daily  . thiamine  100 mg Oral Daily   Or  . thiamine  100 mg Intravenous Daily   Continuous:  [id number], albuterol, chlorpheniramine-HYDROcodone, guaiFENesin-dextromethorphan, HYDROcodone-acetaminophen, ondansetron **OR** ondansetron (ZOFRAN) IV, traZODone   Objective:  Vital Signs  Vitals:   07/18/19 0448 07/18/19 0500 07/18/19 0638 07/18/19 0848  BP: (!) 146/58   (!) 123/49  Pulse: (!) 54 (!) 58  (!) 58  Resp: 14 15  (!) 21  Temp: (!) 97.3 F (36.3 C)  98.5 F (36.9 C) 98.1 F (36.7 C)  TempSrc: Axillary  Oral Oral  SpO2: 96% 96%  94%  Weight:  66 kg    Height:        Intake/Output Summary (Last 24 hours) at 07/18/2019 1023 Last data filed at 07/18/2019 0600 Gross per 24 hour   Intake 600 ml  Output 1150 ml  Net -550 ml   Filed Weights   07/15/19 0500 07/17/19 0500 07/18/19 0500  Weight: 68.2 kg 67.7 kg 66 kg    General appearance: Awake alert.  In no distress Resp: Improved aeration.  Normal effort.  Few crackles at the bases.  No wheezing or rhonchi. Cardio: S1-S2 is normal regular.  No S3-S4.  No rubs murmurs or bruit GI: Abdomen is soft.  Nontender nondistended.  Bowel sounds are present normal.  No masses organomegaly Extremities: No edema.  Full range of motion of lower extremities. Neurologic: Alert and oriented x3.  No focal neurological deficits.     Lab Results:  Data Reviewed: I have personally reviewed following labs and imaging studies  CBC: Recent Labs  Lab 07/12/19 0451 07/13/19 [id number] 07/14/19 0338 07/15/19 [id number]  07/16/19 0225 07/17/19 0005 07/18/19 0124  WBC 18.3* 19.6* 21.7* 24.4* 26.2* 26.5* 25.4*  NEUTROABS 16.7* 17.0* 18.5* 19.9* 22.8*  --   --   HGB 8.4* 8.8* 9.1* 9.1* 9.1* 9.2* 9.1*  HCT 26.5* 27.5* 28.0* 27.8* 28.2* 27.7* 28.1*  MCV 91.1 92.6 90.6 89.7 91.6 90.8 90.9  PLT 298 330 354 390 403* 404* [id number]    Basic Metabolic Panel: Recent Labs  Lab 07/12/19 0451 07/13/19 0512 07/14/19 0338 07/15/19 0609 07/16/19 0225 07/17/19 0005 07/18/19 0124  NA 138 136 137 138 138 130* 136  K 4.9 5.4* 4.6 4.4 4.8 4.3 4.9  CL 109 108 106 102 101 95* 104  CO2 19* 19* 20* 25 26 24 23   GLUCOSE 151* 141* 190* 182* 148* 114* 110*  BUN 50* 49* 54* 52* 67* 78* 83*  CREATININE 1.24 1.16 1.22 1.28* 1.52* 1.56* 1.55*  CALCIUM 8.3* 7.9* 8.1* 8.4* 8.3* 7.7* 8.0*  MG 2.0 1.8 1.8 1.8 1.9  --   --   PHOS 4.0 4.0 4.6  --   --   --   --     GFR: Estimated Creatinine Clearance: 35.5 mL/min (A) (by C-G formula based on SCr of 1.55 mg/dL (H)).  Liver Function Tests: Recent Labs  Lab 07/14/19 0338 07/15/19 0609 07/16/19 0225 07/17/19 0005 07/18/19 0124  AST 27 29 30 30 21   ALT 36 35 39 39 40  ALKPHOS 42 42 43 39 36*  BILITOT 0.5 0.6  0.8 0.6 0.4  PROT 5.2* 5.5* 5.6* 5.2* 4.9*  ALBUMIN 2.1* 2.4* 2.4* 2.2* 2.1*    CBG: Recent Labs  Lab 07/17/19 0833 07/17/19 1120 07/17/19 1701 07/17/19 1951 07/18/19 0850  GLUCAP 153* 366* 314* 264* 83    Anemia Panel: Recent Labs    07/16/19 0225  FERRITIN 287    Recent Results (from the past 240 hour(s))  Culture, blood (Routine X 2) w Reflex to ID Panel     Status: None   Collection Time: 07/10/19  2:43 AM   Specimen: BLOOD  Result Value Ref Range Status   Specimen Description BLOOD LEFT ANTECUBITAL  Final   Special Requests   Final    BOTTLES DRAWN AEROBIC AND ANAEROBIC Blood Culture results may not be optimal due to an inadequate volume of blood received in culture bottles   Culture   Final    NO GROWTH 5 DAYS Performed at [facility], [place] [address], [place], [place] [address]    Report Status 07/15/2019 FINAL  Final  Culture, blood (Routine X 2) w Reflex to ID Panel     Status: None   Collection Time: 07/10/19  2:44 AM   Specimen: BLOOD  Result Value Ref Range Status   Specimen Description BLOOD LEFT ANTECUBITAL  Final   Special Requests   Final    BOTTLES DRAWN AEROBIC AND ANAEROBIC Blood Culture results may not be optimal due to an inadequate volume of blood received in culture bottles   Culture   Final    NO GROWTH 5 DAYS Performed at [facility], [place] [address], [place], [place] [address]    Report Status 07/15/2019 FINAL  Final  MRSA PCR Screening     Status: None   Collection Time: 07/10/19  8:00 PM   Specimen: Nasopharyngeal  Result Value Ref Range Status   MRSA by PCR NEGATIVE NEGATIVE Final    Comment:        The GeneXpert MRSA Assay (FDA approved for NASAL  specimens only), is one component of a comprehensive MRSA colonization surveillance program. It is not intended to diagnose MRSA infection nor to guide or monitor treatment for MRSA infections. Performed at [facility], [place] [address],  [place],  [address]       Radiology Studies: No results found.     LOS: 8 days   Onesty Clair [employer] on [email or web address]  07/18/2019, 10:23 AM

## 2019-07-18 NOTE — Discharge Planning (Signed)
Patient Spo2 remains low 90s on 3L [place] at rest. Ambulating in the hall on 3LNC  - SPO2 fell to 84%. Ambulating in the hall on 4LNC  - Spo2 90%  or better If discharged home, currently, patient will need O2 at home.

## 2019-07-18 NOTE — Progress Notes (Signed)
Results for KEDARIUS, VERBA (MRN [id number]) as of 07/18/2019 15:06  Ref. Range 07/17/2019 11:20 07/17/2019 17:01 07/17/2019 19:51 07/18/2019 08:50 07/18/2019 12:15  Glucose-Capillary Latest Ref Range: 70 - 99 mg/dL 366 (H) 314 (H) 264 (H) 83 284 (H)  Noted that postprandial CBG are greater than 200 mg/dl.   Recommend increasing Novolog correction scale to MODERATE (0-15 units) TID & HS scale. May need to consider adding Novolog 3 units TID as meal coverage if postprandials continue to be elevated.   Harvel Ricks RN BSN CDE Diabetes Coordinator Pager: [phone]  8am-5pm

## 2019-07-19 LAB — COMPREHENSIVE METABOLIC PANEL
ALT: 37 U/L (ref 0–44)
AST: 22 U/L (ref 15–41)
Albumin: 2.1 g/dL — ABNORMAL LOW (ref 3.5–5.0)
Alkaline Phosphatase: 38 U/L (ref 38–126)
Anion gap: 8 (ref 5–15)
BUN: 77 mg/dL — ABNORMAL HIGH (ref 8–23)
CO2: 25 mmol/L (ref 22–32)
Calcium: 8.1 mg/dL — ABNORMAL LOW (ref 8.9–10.3)
Chloride: 104 mmol/L (ref 98–111)
Creatinine, Ser: 1.56 mg/dL — ABNORMAL HIGH (ref 0.61–1.24)
GFR calc Af Amer: 48 mL/min — ABNORMAL LOW (ref >60)
GFR calc non Af Amer: 41 mL/min — ABNORMAL LOW (ref >60)
Glucose, Bld: 134 mg/dL — ABNORMAL HIGH (ref 70–99)
Potassium: 5.1 mmol/L (ref 3.5–5.1)
Sodium: 137 mmol/L (ref 135–145)
Total Bilirubin: 0.7 mg/dL (ref 0.3–1.2)
Total Protein: 4.8 g/dL — ABNORMAL LOW (ref 6.5–8.1)

## 2019-07-19 LAB — GLUCOSE, CAPILLARY
Glucose-Capillary: 121 mg/dL — ABNORMAL HIGH (ref 70–99)
Glucose-Capillary: 140 mg/dL — ABNORMAL HIGH (ref 70–99)
Glucose-Capillary: 174 mg/dL — ABNORMAL HIGH (ref 70–99)

## 2019-07-19 MED ORDER — PREDNISONE 20 MG PO TABS: ORAL_TABLET | ORAL | 0 refills | Status: DC

## 2019-07-19 MED ORDER — INSULIN ASPART 100 UNIT/ML ~~LOC~~ SOLN
0.0000 [IU] | Freq: Three times a day (TID) | SUBCUTANEOUS | Status: DC
  Administered 2019-07-19: 3 [IU] via SUBCUTANEOUS
  Administered 2019-07-19: 2 [IU] via SUBCUTANEOUS

## 2019-07-19 MED ORDER — POLYETHYLENE GLYCOL 3350 17 G PO PACK: 17.0000 g | PACK | Freq: Every day | ORAL | 0 refills | Status: DC

## 2019-07-19 MED ORDER — INSULIN ASPART 100 UNIT/ML ~~LOC~~ SOLN: 0.0000 [IU] | Freq: Every day | SUBCUTANEOUS | Status: DC

## 2019-07-19 MED ORDER — MULTI-VITAMIN/MINERALS PO TABS: 1.0000 | ORAL_TABLET | Freq: Every day | ORAL | 2 refills | Status: DC

## 2019-07-19 MED ORDER — PANTOPRAZOLE SODIUM 40 MG PO TBEC: 40.0000 mg | DELAYED_RELEASE_TABLET | Freq: Every day | ORAL | 0 refills | Status: DC

## 2019-07-19 NOTE — Discharge Instructions (Signed)

## 2019-07-19 NOTE — TOC Progression Note (Signed)
Transition of Care [facility]) - Progression Note    Patient Details  Name: Ryan Pittman MRN: [id number] Date of Birth: 01-06-1941  [facility]) CM/SW Contact  Loletha Grayer Beverely Pace, RN Phone Number: [phone] (working remotely) 07/19/2019, 9:03 AM  Clinical Narrative:   Case manager following for oxygen needs at discharge. Patient now requiring 3L . Will arrange when ordered.     Expected Discharge Plan: Home/Self Care(pending PT/OT eval) Barriers to Discharge: Continued Medical Work up  Expected Discharge Plan and Services Expected Discharge Plan: Home/Self Care(pending PT/OT eval)   Discharge Planning Services: CM Consult   Living arrangements for the past 2 months: Single Family Home                                       Social Determinants of Health (SDOH) Interventions    Readmission Risk Interventions Readmission Risk Prevention Plan 07/15/2019  Transportation Screening Complete  Medication Review Press photographer) Complete  Some recent data might be hidden

## 2019-07-19 NOTE — Progress Notes (Signed)
SATURATION QUALIFICATIONS: (This note is used to comply with regulatory documentation for home oxygen)  Patient Saturations on Room Air at Rest = 98%  Patient Saturations on Room Air while Ambulating = 87-98%  Patient Saturations on 2 Liters of oxygen while Ambulating = 93- 98%  Please briefly explain why patient needs home oxygen: will benefit from home 02 to maintain therapeutic 02 saturations while at home in order to increase safety and also independence with all functional mob/activities.   Horald Chestnut, PT

## 2019-07-19 NOTE — Progress Notes (Signed)
Physical Therapy Treatment Patient Details Name: Ryan Pittman MRN: [id number] DOB: 04/11/1941 Today's Date: 07/19/2019    History of Present Illness 78 y/o male w/ hx of PVD, PNA, HTN, HLD, GERD, dyspnea, CAD s/p CABG, CKD, cancer, arthritis, anemia. Dx w/ COVID 10/26    PT Comments    Pt did extremely well with tx today, was able to complete all bed mob with mod I (w/ increased time to complete and use of bed rail), was able to complete transfers with SBA and ambulate approx 327ft with SBA and vcs, ambulated on room air with sats ranging from 87-98% and on 2L/min with sats ranging from 93-98%. reinforced pursed lip breathing, exercises with incentive spirometer and also flutter valve.     Follow Up Recommendations  No PT follow up     Equipment Recommendations  None recommended by PT    Recommendations for Other Services       Precautions / Restrictions Precautions Precautions: Fall Restrictions Weight Bearing Restrictions: No    Mobility  Bed Mobility Overal bed mobility: Modified Independent Bed Mobility: Rolling;Supine to Sit;Sit to Supine Rolling: Modified independent (Device/Increase time)   Supine to sit: Modified independent (Device/Increase time) Sit to supine: Modified independent (Device/Increase time)      Transfers Overall transfer level: Needs assistance Equipment used: None Transfers: Sit to/from [employer] Sit to Stand: Modified independent (Device/Increase time) Stand pivot transfers: Supervision          Ambulation/Gait Ambulation/Gait assistance: Supervision Gait Distance (Feet): 300 Feet Assistive device: None Gait Pattern/deviations: Step-through pattern;Wide base of support Gait velocity: very slow cadenced   General Gait Details: slow paced, states floor feels slick   Stairs             Wheelchair Mobility    Modified Rankin (Stroke Patients Only)       Balance Overall balance assessment: Needs  assistance Sitting-balance support: Feet unsupported Sitting balance-Leahy Scale: Good   Postural control: Other (comment)(none noted) Standing balance support: During functional activity Standing balance-Leahy Scale: Good Standing balance comment: shifts weight with ambulation, makes turns no Lobs noted                             Cognition Arousal/Alertness: Awake/alert Behavior During Therapy: WFL for tasks assessed/performed Overall Cognitive Status: Within Functional Limits for tasks assessed                                        Exercises Other Exercises Other Exercises: IS x 5 pulls 1210ml Other Exercises: flutter valve  x 5 reps with vcs Other Exercises: sit<>stand using BUE for support x 10    General Comments General comments (skin integrity, edema, etc.): Pt on 3L/min via [place] at therapist arrival in room but is complaining that he is having a difficult time keeping canula in nose hence he is on room air and sats in 90s to high 90s at rest. supine to sit EOB on room air desats to 88% but with deep breathing (needs reinforcement of pursed lip breathing) able to recover to 90s. ambulated initial 154ft wi/ SBA and cues on room air and sats min 87% but max 98%, ambulated back to room 139ft on 2L/min and min sats noted to be 93% with max 98% during ambulation. Worked on Chiropodist, pt needs verbal cues to complete this and  also flutter valve, completed x 5 resps each. Also completed sit<>stand at EOB x 10 on 3L/min with sats remaining in 90s.      Pertinent Vitals/Pain Pain Assessment: No/denies pain    Home Living                      Prior Function            PT Goals (current goals can now be found in the care plan section) Acute Rehab PT Goals Patient Stated Goal: wants to go home today Time For Goal Achievement: 07/29/19 Potential to Achieve Goals: Good Progress towards PT goals: Progressing toward goals     Frequency    Min 3X/week      PT Plan Current plan remains appropriate    Co-evaluation              AM-PAC PT "6 Clicks" Mobility   Outcome Measure  Help needed turning from your back to your side while in a flat bed without using bedrails?: None Help needed moving from lying on your back to sitting on the side of a flat bed without using bedrails?: A Little Help needed moving to and from a bed to a chair (including a wheelchair)?: A Little Help needed standing up from a chair using your arms (e.g., wheelchair or bedside chair)?: A Little Help needed to walk in hospital room?: A Little Help needed climbing 3-5 steps with a railing? : A Little 6 Click Score: 19    End of Session Equipment Utilized During Treatment: Oxygen Activity Tolerance: Patient tolerated treatment well Patient left: in bed;with call bell/phone within reach Nurse Communication: Mobility status PT Visit Diagnosis: Other abnormalities of gait and mobility (R26.89);Muscle weakness (generalized) (M62.81)     Time: [id number] PT Time Calculation (min) (ACUTE ONLY): 25 min  Charges:  $Gait Training: 8-22 mins $Therapeutic Exercise: 8-22 mins                    Horald Chestnut, PT    Delford Field 07/19/2019, 12:21 PM

## 2019-07-19 NOTE — Discharge Planning (Signed)
Patient IV removed.  RN assessment and VS revealed stability for DC to home with O2 (from [place]).  Concentrater has been deliver home prior to DC.  Talked about importance of remaining in self isolation for additional 2 weeks beyond DC.  Also discussed watching for s/sx of increased diff breathing/SOB, weakness or uncontrolled temp.  Informed of suggested FU with PCP Scripts sent to pharm, per patient choice.  Also left hospital with O2 tank.  DC contract signed and placed on chart.  Once ready, will be wheeled to front and family transporting home via car.

## 2019-07-19 NOTE — TOC Transition Note (Signed)
Transition of Care [facility]) - CM/SW Discharge Note   Patient Details  Name: Ryan Pittman MRN: [id number] Date of Birth: 1940-11-07  [facility]) CM/SW Contact:  Ninfa Meeker, RN Phone Number: 07/19/2019, 11:49 AM   Clinical Narrative:  Case manager spoke with patient's wife concerning her husband's need for oxygen. Referral was called to [facility] with [employer]. Requested that concentrator and tanks be delivered as soon as possible, wife is very anxious to get her husband home. Will contact AC at [facility] to have tank delivered to patient's room for transport home. No further needs identified.     Final next level of care: Home/Self Care Barriers to Discharge: No Barriers Identified   Patient Goals and CMS Choice Patient states their goals for this hospitalization and ongoing recovery are:: to get better   Choice offered to / list presented to : Spouse  Discharge Placement                       Discharge Plan and Services   Discharge Planning Services: CM Consult Post Acute Care Choice: Durable Medical Equipment          DME Arranged: Oxygen DME Agency: [place] Date DME Agency Contacted: 07/19/19 Time DME Agency Contacted: [id number] Representative spoke with at DME Agency: Jeneen Rinks HH Arranged: NA [place] Agency: NA        Social Determinants of Health ([place]) Interventions     Readmission Risk Interventions Readmission Risk Prevention Plan 07/15/2019  Transportation Screening Complete  Medication Review Press photographer) Complete  Some recent data might be hidden

## 2019-07-19 NOTE — Progress Notes (Signed)
Assumed care of patient from [place], [place], Pt A&Ox3, resting in bed, no resp distress, on 3 liters [place], productive cough noted, tussionex given per [facility], took CBG- was 140, paged MD on call regarding Insulin drip orders, received call back and order was d/c'ed, call bell in reach, vss, will continue to monitor.

## 2019-07-19 NOTE — Plan of Care (Signed)
Spoke with wife about current POC and patient status.  Understands that patient should be coming home today, but will probably need O2 (even at rest) - for now.  Explained that we would be setting up oxygen delivered to the house and we wouldn't be able to DC till it arrives.  No further question at this time.

## 2019-07-19 NOTE — Discharge Summary (Signed)
[facility]  Physician Discharge Summary   Patient ID: Ryan Pittman MRN: [id number] DOB/AGE: 05/29/1941 78 y.o.  Admit date: 07/09/2019 Discharge date: 07/20/2019  PCP: Tonia Ghent, MD  DISCHARGE DIAGNOSES:  Acute respiratory failure with hypoxia Pneumonia due to COVID-19 Chronic kidney disease stage III Acute kidney injury, resolved Normocytic anemia Coronary artery disease status post CABG Essential hypertension Hypothyroidism Paroxysmal atrial fibrillation Prediabetes   RECOMMENDATIONS FOR OUTPATIENT FOLLOW UP: 1. Patient being discharged with home oxygen 2. Patient asked to not take his ARB 3. Outpatient monitoring of prediabetes    Home Health: None Equipment/Devices: Home oxygen  CODE STATUS: Full code  DISCHARGE CONDITION: fair  Diet recommendation: As before  INITIAL HISTORY: 78 year old Caucasian male with past medical history of coronary artery disease status post CABG, essential hypertension, hypothyroidism, chronic kidney disease stage III was diagnosed with COVID-19 on 10/26.  Presented with worsening shortness of breath and cough.  Was noted to be saturating in the mid 90s initially.  However he worsened on 11/8.  He remains hospitalized at the [facility].   HOSPITAL COURSE:   Acute Hypoxic Resp. Failure/Pneumonia due to COVID-19 Patient was hospitalized.  He was placed on levofloxacin as well as remdesivir.  He was given steroids.  Patient slowly started improving.  His oxygen requirements improved.  He saturating in the early 90s on 3 L of oxygen.  He will be discharged with home oxygen.  Steroid taper.  His inflammatory markers have improved.  D-dimer is down to 1.10.  Patient feels much better.  Wants to go home.  He has ambulated without difficulty.  Acute kidney injury superimposed on chronic kidney disease stage III/hyponatremia Baseline creatinine is around 1.3-1.5.  He did have a creatinine of 2 during the course of  this hospitalization.  Creatinine is stable for the most part.    ARB on hold.   Normocytic anemia Hemoglobin is stable.  No evidence of overt bleeding.  There was concern for dark-colored stool however he was noted to be on Pepto-Bismol.  Hemoglobin has been stable.  No concern for GI bleed at this time.    PPI for 30 days  Coronary artery disease status post CABG Stable  Essential hypertension Blood Pressure is reasonably well controlled..  Continue metoprolol and amlodipine.  Losartan is on hold.    Hypothyroidism Status post thyroidectomy.  Continue levothyroxine.  History of paroxysmal atrial fibrillation Noted to be on amiodarone.  Not anticoagulated.  Apparently this occurred after his CABG.  Hyperglycemia/prediabetes HbA1c 6.1.  Monitoring.  History of alcohol use Noted to be on CIWA protocol.  No evidence for withdrawal symptoms currently.  Overall stable.  Okay for discharge home.   PERTINENT LABS:  The results of significant diagnostics from this hospitalization (including imaging, microbiology, ancillary and laboratory) are listed below for reference.    Microbiology: Recent Results (from the past 240 hour(s))  MRSA PCR Screening     Status: None   Collection Time: 07/10/19  8:00 PM   Specimen: Nasopharyngeal  Result Value Ref Range Status   MRSA by PCR NEGATIVE NEGATIVE Final    Comment:        The GeneXpert MRSA Assay (FDA approved for NASAL specimens only), is one component of a comprehensive MRSA colonization surveillance program. It is not intended to diagnose MRSA infection nor to guide or monitor treatment for MRSA infections. Performed at [facility], [place] [address], [place],  [address]      Labs:  COVID-19 Labs  Recent Labs    07/18/19 0124  DDIMER 1.10*  CRP 2.1*    Lab Results  Component Value Date   SARSCOV2NAA Detected (A) 06/30/2019   [place] NEGATIVE 06/23/2019      Basic Metabolic  Panel: Recent Labs  Lab 07/14/19 0338 07/15/19 0609 07/16/19 0225 07/17/19 0005 07/18/19 0124 07/19/19 0100  NA 137 138 138 130* 136 137  K 4.6 4.4 4.8 4.3 4.9 5.1  CL 106 102 101 95* 104 104  CO2 20* 25 26 24 23 25   GLUCOSE 190* 182* 148* 114* 110* 134*  BUN 54* 52* 67* 78* 83* 77*  CREATININE 1.22 1.28* 1.52* 1.56* 1.55* 1.56*  CALCIUM 8.1* 8.4* 8.3* 7.7* 8.0* 8.1*  MG 1.8 1.8 1.9  --   --   --   PHOS 4.6  --   --   --   --   --    Liver Function Tests: Recent Labs  Lab 07/15/19 0609 07/16/19 0225 07/17/19 0005 07/18/19 0124 07/19/19 0100  AST 29 30 30 21 22   ALT 35 39 39 40 37  ALKPHOS 42 43 39 36* 38  BILITOT 0.6 0.8 0.6 0.4 0.7  PROT 5.5* 5.6* 5.2* 4.9* 4.8*  ALBUMIN 2.4* 2.4* 2.2* 2.1* 2.1*   CBC: Recent Labs  Lab 07/14/19 0338 07/15/19 0609 07/16/19 0225 07/17/19 0005 07/18/19 0124  WBC 21.7* 24.4* 26.2* 26.5* 25.4*  NEUTROABS 18.5* 19.9* 22.8*  --   --   HGB 9.1* 9.1* 9.1* 9.2* 9.1*  HCT 28.0* 27.8* 28.2* 27.7* 28.1*  MCV 90.6 89.7 91.6 90.8 90.9  PLT 354 390 403* 404* 382   BNP: BNP (last 3 results) Recent Labs    07/14/19 0830 07/15/19 0609 07/16/19 0225  BNP 208.7* 146.9* 105.2*    CBG: Recent Labs  Lab 07/18/19 1719 07/18/19 2108 07/19/19 0011 07/19/19 0817 07/19/19 1319  GLUCAP 324* 543* 140* 121* 174*     IMAGING STUDIES Dg Chest Port 1 View  Result Date: 07/13/2019 CLINICAL DATA:  Shortness of breath EXAM: PORTABLE CHEST 1 VIEW COMPARISON:  07/12/2019 FINDINGS: Diffuse bilateral interstitial and patchy alveolar airspace opacities involving the upper and lower lungs similar in appearance to the prior examination consistent with multilobar pneumonia including atypical viral pneumonia such as COVID-19. No pleural effusion or pneumothorax. Stable cardiomediastinal silhouette. Prior median sternotomy. No acute osseous abnormality. IMPRESSION: Diffuse bilateral interstitial and patchy alveolar airspace opacities involving the upper  and lower lungs similar in appearance to the prior examination consistent with multilobar pneumonia including atypical viral pneumonia such as COVID-19. Electronically Signed   By: Kathreen Devoid   On: 07/13/2019 14:07   Dg Chest Port 1 View  Result Date: 07/12/2019 CLINICAL DATA:  Patient with shortness of breath. EXAM: PORTABLE CHEST 1 VIEW COMPARISON:  Chest radiograph 07/12/2019 FINDINGS: Monitoring leads overlie the patient. Stable cardiac and mediastinal contours status post median sternotomy. Similar-appearing bilateral peripheral consolidative opacities. No pleural effusion or pneumothorax. IMPRESSION: Similar-appearing bilateral consolidative opacities. Electronically Signed   By: Lovey Newcomer M.D.   On: 07/12/2019 15:15   Dg Chest Port 1 View  Result Date: 07/12/2019 CLINICAL DATA:  COVID positive.  Shortness of breath. EXAM: PORTABLE CHEST 1 VIEW COMPARISON:  Chest radiograph 07/09/2019 FINDINGS: Monitoring leads overlie the patient. Stable cardiac and mediastinal contours status post median sternotomy. Similar-appearing bilateral predominately mid lower lung peripheral consolidative opacities. No pleural effusion or pneumothorax. IMPRESSION: Similar-appearing peripheral mid lower lung consolidative opacities which may represent atypical/viral pneumonia. Electronically Signed  By: Lovey Newcomer M.D.   On: 07/12/2019 10:07   Dg Chest Portable 1 View  Result Date: 07/09/2019 CLINICAL DATA:  Pt was tested postitive for Covid at [facility] about a week ago. He presents today because he states "I can't stop coughing". Cough is non productive, he denies fever, chest pains, only dyspnea after cough. EXAM: PORTABLE CHEST 1 VIEW COMPARISON:  Stable cardiomediastinal contours status post median sternotomy and CABG. Heart size is normal. FINDINGS: The heart size and mediastinal contours are within normal limits. Both lungs are clear. The visualized skeletal structures are unremarkable. There are mild  infiltrates in the bilateral lower lobes suspicious for bronchopneumonia. No pneumothorax or large pleural effusion. No acute finding in the visualized skeleton. IMPRESSION: Mild bilateral lower lobe infiltrates suspicious for bronchopneumonia. Recommend follow-up radiograph in 3-4 weeks to ensure resolution. Electronically Signed   By: Audie Pinto M.D.   On: 07/09/2019 18:32    DISCHARGE EXAMINATION: Vitals:   07/19/19 0424 07/19/19 0500 07/19/19 0600 07/19/19 0804  BP:    105/61  Pulse:  (!) 54 (!) 54 (!) 58  Resp:  11 10 18   Temp: 97.8 F (36.6 C)   97.9 F (36.6 C)  TempSrc: Oral   Oral  SpO2:  96% 97% 92%  Weight: 65.7 kg     Height:       General appearance: Awake alert.  In no distress Resp: Improved aeration.  Few crackles at the bases.  No wheezing or rhonchi. Cardio: S1-S2 is normal regular.  No S3-S4.  No rubs murmurs or bruit GI: Abdomen is soft.  Nontender nondistended.  Bowel sounds are present normal.  No masses organomegaly Extremities: No edema.  Full range of motion of lower extremities. Neurologic: Alert and oriented x3.  No focal neurological deficits.    DISPOSITION: Home  Discharge Instructions    MyChart COVID-19 home monitoring program   Complete by: Jul 19, 2019    Is the patient willing to use the [place] for home monitoring?: Yes   Temperature monitoring   Complete by: Jul 19, 2019    After how many days would you like to receive a notification of this patient's flowsheet entries?: 1   Call MD for:  difficulty breathing, headache or visual disturbances   Complete by: As directed    Call MD for:  extreme fatigue   Complete by: As directed    Call MD for:  persistant dizziness or light-headedness   Complete by: As directed    Call MD for:  persistant nausea and vomiting   Complete by: As directed    Call MD for:  severe uncontrolled pain   Complete by: As directed    Call MD for:  temperature >100.4   Complete by: As directed     Diet - low sodium heart healthy   Complete by: As directed    Discharge instructions   Complete by: As directed    Please use your oxygen around-the-clock.  Follow-up with your doctor.  COVID 19 INSTRUCTIONS  - You are felt to be stable enough to no longer require inpatient monitoring, testing, and treatment, though you will need to follow the recommendations below: - Based on the CDC's non-test criteria for ending self-isolation: You may not return to work/leave the home until at least 21 days since symptom onset AND 3 days without a fever (without taking tylenol, ibuprofen, etc.) AND have improvement in respiratory symptoms. - Do not take NSAID medications (including, but  not limited to, ibuprofen, advil, motrin, naproxen, aleve, goody's powder, etc.) - Follow up with your doctor in the next week via telehealth or seek medical attention right away if your symptoms get WORSE.  - Consider donating plasma after you have recovered (either 14 days after a negative test or 28 days after symptoms have completely resolved) because your antibodies to this virus may be helpful to give to others with life-threatening infections. Please go to the website www.[email or web address] if you would like to consider volunteering for plasma donation.    Directions for you at home:  Wear a facemask You should wear a facemask that covers your nose and mouth when you are in the same room with other people and when you visit a healthcare provider. People who live with or visit you should also wear a facemask while they are in the same room with you.  Separate yourself from other people in your home As much as possible, you should stay in a different room from other people in your home. Also, you should use a separate bathroom, if available.  Avoid sharing household items You should not share dishes, drinking glasses, cups, eating utensils, towels, bedding, or other items with other people in your home. After using these  items, you should wash them thoroughly with soap and water.  Cover your coughs and sneezes Cover your mouth and nose with a tissue when you cough or sneeze, or you can cough or sneeze into your sleeve. Throw used tissues in a lined trash can, and immediately wash your hands with soap and water for at least 20 seconds or use an alcohol-based hand rub.  Wash your [facility] your hands often and thoroughly with soap and water for at least 20 seconds. You can use an alcohol-based hand sanitizer if soap and water are not available and if your hands are not visibly dirty. Avoid touching your eyes, nose, and mouth with unwashed hands.  Directions for those who live with, or provide care at home for you:  Limit the number of people who have contact with the patient If possible, have only one caregiver for the patient. Other household members should stay in another home or place of residence. If this is not possible, they should stay in another room, or be separated from the patient as much as possible. Use a separate bathroom, if available. Restrict visitors who do not have an essential need to be in the home.  Ensure good ventilation Make sure that shared spaces in the home have good air flow, such as from an air conditioner or an opened window, weather permitting.  Wash your hands often Wash your hands often and thoroughly with soap and water for at least 20 seconds. You can use an alcohol based hand sanitizer if soap and water are not available and if your hands are not visibly dirty. Avoid touching your eyes, nose, and mouth with unwashed hands. Use disposable paper towels to dry your hands. If not available, use dedicated cloth towels and replace them when they become wet.  Wear a facemask and gloves Wear a disposable facemask at all times in the room and gloves when you touch or have contact with the patients blood, body fluids, and/or secretions or excretions, such as sweat, saliva,  sputum, nasal mucus, vomit, urine, or feces.  Ensure the mask fits over your nose and mouth tightly, and do not touch it during use. Throw out disposable facemasks and gloves after using them. Do not reuse.  Wash your hands immediately after removing your facemask and gloves. If your personal clothing becomes contaminated, carefully remove clothing and launder. Wash your hands after handling contaminated clothing. Place all used disposable facemasks, gloves, and other waste in a lined container before disposing them with other household waste. Remove gloves and wash your hands immediately after handling these items.  Do not share dishes, glasses, or other household items with the patient Avoid sharing household items. You should not share dishes, drinking glasses, cups, eating utensils, towels, bedding, or other items with a patient who is confirmed to have, or being evaluated for, COVID-19 infection. After the person uses these items, you should wash them thoroughly with soap and water.  Wash laundry thoroughly Immediately remove and wash clothes or bedding that have blood, body fluids, and/or secretions or excretions, such as sweat, saliva, sputum, nasal mucus, vomit, urine, or feces, on them. Wear gloves when handling laundry from the patient. Read and follow directions on labels of laundry or clothing items and detergent. In general, wash and dry with the warmest temperatures recommended on the label.  Clean all areas the individual has used often Clean all touchable surfaces, such as counters, tabletops, doorknobs, bathroom fixtures, toilets, phones, keyboards, tablets, and bedside tables, every day. Also, clean any surfaces that may have blood, body fluids, and/or secretions or excretions on them. Wear gloves when cleaning surfaces the patient has come in contact with. Use a diluted bleach solution (e.g., dilute bleach with 1 part bleach and 10 parts water) or a household disinfectant with a  label that says EPA-registered for coronaviruses. To make a bleach solution at home, add 1 tablespoon of bleach to 1 quart (4 cups) of water. For a larger supply, add  cup of bleach to 1 gallon (16 cups) of water. Read labels of cleaning products and follow recommendations provided on product labels. Labels contain instructions for safe and effective use of the cleaning product including precautions you should take when applying the product, such as wearing gloves or eye protection and making sure you have good ventilation during use of the product. Remove gloves and wash hands immediately after cleaning.  Monitor yourself for signs and symptoms of illness Caregivers and household members are considered close contacts, should monitor their health, and will be asked to limit movement outside of the home to the extent possible. Follow the monitoring steps for close contacts listed on the symptom monitoring form.   If you have additional questions, contact your local health department or call the epidemiologist on call at [phone] (available 24/7). This guidance is subject to change. For the most up-to-date guidance from [facility], please refer to their website: [email or web address].pl   You were cared for by a hospitalist during your hospital stay. If you have any questions about your discharge medications or the care you received while you were in the hospital after you are discharged, you can call the unit and asked to speak with the hospitalist on call if the hospitalist that took care of you is not available. Once you are discharged, your primary care physician will handle any further medical issues. Please note that NO REFILLS for any discharge medications will be authorized once you are discharged, as it is imperative that you return to your primary care physician (or establish a relationship with a primary care physician if you do not have one)  for your aftercare needs so that they can reassess your need for medications and monitor your lab values. If you do not  have a primary care physician, you can call [phone] for a physician referral.   Increase activity slowly   Complete by: As directed         Allergies as of 07/19/2019      Reactions   Penicillins Itching, Other (See Comments)   PATIENT HAS HAD A PCN REACTION WITH IMMEDIATE RASH, FACIAL/TONGUE/THROAT SWELLING, SOB, OR LIGHTHEADEDNESS WITH HYPOTENSION:  #  #  YES  #  #  Has patient had a PCN reaction causing severe rash involving mucus membranes or skin necrosis: No Has patient had a PCN reaction that required hospitalization: No Has patient had a PCN reaction occurring within the last 10 years: No If all of the above answers are "NO", then may proceed with Cephalosporin use.   Ace Inhibitors Cough   Aspirin Other (See Comments)   H/o GI bleed   Celebrex [celecoxib] Other (See Comments)   GI bleed   Lipitor [atorvastatin] Other (See Comments)   myalgias      Medication List    STOP taking these medications   losartan 100 MG tablet Commonly known as: COZAAR     TAKE these medications   acetaminophen 500 MG tablet Commonly known as: TYLENOL Take 1,000 mg by mouth every 6 (six) hours as needed for mild pain.   amiodarone 200 MG tablet Commonly known as: PACERONE TAKE 1 TABLET BY MOUTH DAILY   amLODipine 2.5 MG tablet Commonly known as: NORVASC TAKE ONE TABLET BY MOUTH EVERY DAY What changed:   how much to take  how to take this  when to take this   benzonatate 200 MG capsule Commonly known as: TESSALON Take 1 capsule (200 mg total) by mouth 3 (three) times daily as needed for cough.   clopidogrel 75 MG tablet Commonly known as: PLAVIX TAKE ONE TABLET EVERY DAY What changed: when to take this   Co-Enzyme Q-10 100 MG Caps Take 100 mg by mouth daily.   FISH OIL OMEGA-3 PO Take 1,400 Units by mouth daily.   levothyroxine 75 MCG  tablet Commonly known as: SYNTHROID Take 75 mcg by mouth daily before breakfast. EXCEPT: Takes 112.5 mg  every Thursday per pt   metoprolol tartrate 25 MG tablet Commonly known as: LOPRESSOR Take 0.5 tablets (12.5 mg total) by mouth 2 (two) times daily.   multivitamin with minerals tablet Take 1 tablet by mouth daily.   pantoprazole 40 MG tablet Commonly known as: PROTONIX Take 1 tablet (40 mg total) by mouth daily.   polyethylene glycol 17 g packet Commonly known as: MIRALAX / GLYCOLAX Take 17 g by mouth daily.   pravastatin 40 MG tablet Commonly known as: PRAVACHOL TAKE ONE TABLET BY MOUTH EVERY DAY   predniSONE 20 MG tablet Commonly known as: DELTASONE Take 3 tablets once daily for 3 days followed by 2 tablets once daily for 3 days followed by 1 tablet once daily for 3 days and then stop   Repatha SureClick [id number] MG/ML So[facility] Generic drug: Evolocumab Inject 140 mg into the skin every 14 (fourteen) days.   tamsulosin 0.4 MG Caps capsule Commonly known as: FLOMAX Take 1 capsule (0.4 mg total) by mouth daily.   traZODone 100 MG tablet Commonly known as: DESYREL Take 50-100 mg by mouth at bedtime as needed for sleep.   vitamin C 500 MG tablet Commonly known as: ASCORBIC ACID Take 500 mg by mouth daily.   Vitamin D 50 MCG (2000 UT) tablet Take 2,000 Units by mouth daily.  Follow-up Information    Tonia Ghent, MD. Schedule an appointment as soon as possible for a visit in 1 week(s).   Specialty: Family Medicine Contact information: [place] [address] [phone]           TOTAL DISCHARGE TIME: 61 minutes  [place]  [facility] Pager on [email or web address]  07/20/2019, 2:42 PM

## 2019-07-21 ENCOUNTER — Telehealth: Payer: Self-pay

## 2019-07-21 NOTE — Telephone Encounter (Signed)
Mrs Macmillan(DPR signed) said that pt was discharged from [facility] on 07/19/19. pts pharmacy is not open on weekends and just got his meds from discharge this morning. pts wife said no one went over d/c instructions with her or the pt. Reviewed entire med list with pts wife and pt started prednisone this morning; Mrs Doucet said pt was supposed to limit Na and since the pantoprazole Na 40 mg pts wants to wait and talk with Dr Damita Dunnings at 11/17 11:30 AM HFU. Also pt was told by [facility] not to take any thing with minerals in it and so she has not given pt multivitamin with minerals until speaks with Dr Damita Dunnings.pt is to quarantine until 07/28/19. Pt has prod cough with clear phlegm;pt is not coughing a lot and cough and taste ability is better now. pts wife is wearing a mask and gloves and trying as much as possible to social distance. UC & ED precautions given and mrs Zapanta voiced understanding.and if Mrs Heuberger thinks of any more questions prior to virtual appt on 11/17 she wll cb.

## 2019-07-21 NOTE — Telephone Encounter (Signed)
Would take pantoprazole, okay to start in the meantime.   Would be okay to take MVI especially given his h/o anemia.  Will be glad to talk with them at f/u.  I am glad he is out of the hospital.  Thanks.

## 2019-07-21 NOTE — Telephone Encounter (Signed)
Wife advised. 

## 2019-07-22 ENCOUNTER — Telehealth: Payer: Self-pay | Admitting: *Deleted

## 2019-07-22 ENCOUNTER — Ambulatory Visit (INDEPENDENT_AMBULATORY_CARE_PROVIDER_SITE_OTHER): Payer: Medicare Other | Admitting: Family Medicine

## 2019-07-22 DIAGNOSIS — D649 Anemia, unspecified: Secondary | ICD-10-CM

## 2019-07-22 DIAGNOSIS — U071 COVID-19: Secondary | ICD-10-CM | POA: Diagnosis not present

## 2019-07-22 DIAGNOSIS — Z862 Personal history of diseases of the blood and blood-forming organs and certain disorders involving the immune mechanism: Secondary | ICD-10-CM

## 2019-07-22 DIAGNOSIS — I701 Atherosclerosis of renal artery: Secondary | ICD-10-CM | POA: Diagnosis not present

## 2019-07-22 DIAGNOSIS — I10 Essential (primary) hypertension: Secondary | ICD-10-CM

## 2019-07-22 DIAGNOSIS — N179 Acute kidney failure, unspecified: Secondary | ICD-10-CM

## 2019-07-22 DIAGNOSIS — N1831 Chronic kidney disease, stage 3a: Secondary | ICD-10-CM

## 2019-07-22 LAB — GLUCOSE, CAPILLARY: Glucose-Capillary: 156 mg/dL — ABNORMAL HIGH (ref 70–99)

## 2019-07-22 NOTE — Telephone Encounter (Signed)
I would try vaseline or could try neosporin.  We talked about this but there was a lot to cover in the conversation.  Thanks.

## 2019-07-22 NOTE — Progress Notes (Signed)
Virtual visit completed through WebEx or similar program Patient location: home  Provider location: Financial controller at [facility], office   Pandemic considerations d/w pt.   Limitations and rationale for visit method d/w patient.  Patient agreed to proceed.   CC: Follow-up Covid.  HPI:  =========================== Admit date: 07/09/2019 Discharge date: 07/20/2019  PCP: Tonia Ghent, MD  DISCHARGE DIAGNOSES:  Acute respiratory failure with hypoxia Pneumonia due to COVID-19 Chronic kidney disease stage III Acute kidney injury, resolved Normocytic anemia Coronary artery disease status post CABG Essential hypertension Hypothyroidism Paroxysmal atrial fibrillation Prediabetes   RECOMMENDATIONS FOR OUTPATIENT FOLLOW UP: 1. Patient being discharged with home oxygen 2. Patient asked to not take his ARB 3. Outpatient monitoring of prediabetes  Home Health: None Equipment/Devices: Home oxygen  CODE STATUS: Full code  DISCHARGE CONDITION: fair  Diet recommendation: As before  INITIAL HISTORY: 78 year old Caucasian male with past medical history of coronary artery disease status post CABG, essential hypertension, hypothyroidism, chronic kidney disease stage III was diagnosed with COVID-19 on 10/26. Presented with worsening shortness of breath and cough. Was noted to be saturating in the mid 90s initially. However he worsened on 11/8. He remains hospitalized at the [facility].   HOSPITAL COURSE:   Acute Hypoxic Resp. Failure/Pneumonia due to COVID-19 Patient was hospitalized.  He was placed on levofloxacin as well as remdesivir.  He was given steroids.  Patient slowly started improving.  His oxygen requirements improved.  He saturating in the early 90s on 3 L of oxygen.  He will be discharged with home oxygen.  Steroid taper.  His inflammatory markers have improved.  D-dimer is down to 1.10.  Patient feels much better.  Wants to go home.  He has ambulated  without difficulty.  Acute kidney injury superimposed on chronic kidney disease stage III/hyponatremia Baseline creatinine is around 1.3-1.5. He did have a creatinine of 2 during the course of this hospitalization. Creatinine is stable for the most part.   ARB on hold.  Normocytic anemia Hemoglobin is stable. No evidence of overt bleeding. There was concern for dark-colored stool however he was noted to be on Pepto-Bismol. Hemoglobin has been stable. No concern for GI bleed at this time.   PPI for 30 days  Coronary artery disease status post CABG Stable  Essential hypertension BloodPressure is reasonably well controlled.. Continue metoprolol and amlodipine. Losartan is on hold.   Hypothyroidism Status post thyroidectomy. Continue levothyroxine.  History of paroxysmal atrial fibrillation Noted to be on amiodarone. Not anticoagulated. Apparently this occurred after his CABG.  Hyperglycemia/prediabetes HbA1c 6.1.  Monitoring. ========================= Inpatient course discussed with patient.  He was treated with steroids and oxygen and the routine inpatient Covid protocol based on his situation.  He improved enough to the point where he can be discharged home.  The above was discussed with patient.  Since he has been home, pulse ox 97-98% on recent checks, still on 3L O2 [place].   Goal pulse ox ~95%.  We talked about gradual trial of O2 taper when at rest to see if he can tolerate 2 L at rest and still maintain his pulse ox around 95% or higher  His breathing is clearly better.  He is walking and his balance is better.   He hasn't gone to work yet, d/w pt.    He is on PPI and prednisone.    He has some mild BLE edema.  D/w pt about leg elevation.    He had penile irritation after catheter use.  Can use neosporin if needed.  He is better in the meantime.  Discussed.  Discussed getting follow-up labs regarding his sugar anemia and creatinine.  He is still off  losartan.  Med list discussed.  Rationale for treatment discussed.  Past medical history and social history reviewed.  Meds and allergies reviewed.   ROS: Per HPI unless specifically indicated in ROS section   NAD Speech wnl  A/P: Acute Hypoxic Resp. Failure/Pneumonia due to COVID-19 He clearly feels better.  See above about attempted slow wean of oxygen down to 2 L at rest.  He will update me as needed.  He will finish his steroid taper.  Acute kidney injury superimposed on chronic kidney disease stage III/hyponatremia Recheck creatinine pending for next week.  ARB on hold.  Normocytic anemia Continue PPI.  Follow-up labs pending.  Coronary artery disease status post CABG Not having chest pain.  Essential hypertension Continue metoprolol and amlodipine. Losartan is on hold.   Hypothyroidism Continue levothyroxine.  History of paroxysmal atrial fibrillation No change in meds at this point.  Hyperglycemia/prediabetes We can recheck periodically.  He agrees.

## 2019-07-22 NOTE — Telephone Encounter (Signed)
Patient's wife called stating that he had a telephone visit today with Dr. Damita Dunnings. Patient's wife stated that they mentioned a problem with his penis bleeding, but got off track talking about other issues. Patient's wife stated that while he was in the hospital they put some tape on his penis to hold the urinal in place. Mrs. Broccoli stated that some of the skin on his penis was pulled off when they pulled the tape off. . Ms. Gupta wants to know if Dr, Damita Dunnings can either recommend something over the counter for this or send a prescription to the pharmacy?   Mrs. Kayton stated that the area had been bleeding some, but after his phone visit this morning it was bleeding more. Mrs. Elrick stated that the area does not look infected, but just irritated where the skin was pulled off.  [facility]

## 2019-07-22 NOTE — Telephone Encounter (Signed)
Wife advised. 

## 2019-07-23 ENCOUNTER — Encounter: Payer: Self-pay | Admitting: Family Medicine

## 2019-07-23 ENCOUNTER — Telehealth: Payer: Self-pay

## 2019-07-23 NOTE — Assessment & Plan Note (Signed)
Continue PPI.  Follow-up labs pending.

## 2019-07-23 NOTE — Telephone Encounter (Signed)
If it is going to go better for the patient to have blood drawn in the car, then I support that.  Please get him scheduled for the 24th and please give them the instructions to call when they arrive and not come in the building.  Thanks.

## 2019-07-23 NOTE — Telephone Encounter (Signed)
Ryan Pittman said she did not want to miss a call but has not gotten call about lab appt on 07/29/19. I looked at virtual visit on 07/22/19 and Dr Damita Dunnings had at end of visit for pt to reck labs 07/29/19 (CBC/iron/cmet). I spoke with Terri and Tamera in lab and they do not feel comfortable making that decision of whether to schedule pt at [facility]. Pt had positive test on 06/30/19; 07/09/19 admitted to hospital and pt had virtual appt on 07/22/19. Ryan Pittman said she was told by hospital doctor and Dr Damita Dunnings that pt could come out of quarantine on 07/28/19. Ryan Pittman is tied up in meeting and will send note to Dr Damita Dunnings to respond; Ryan Pittman said cb on 07/24/19 or 07/25/19 would be fine..Please advise.

## 2019-07-23 NOTE — Assessment & Plan Note (Signed)
  Recheck creatinine pending for next week.  ARB on hold.

## 2019-07-23 NOTE — Telephone Encounter (Signed)
We would rather go to the car to protect the clinic/staff/patients on the chance he may still be positive.  Just have them call when they arrive please and we will go to them in the back lot

## 2019-07-23 NOTE — Telephone Encounter (Signed)
I was going to talk to Ryan Pittman about scheduling this appointment.  Given that his covid test was 06/30/2019, scheduling next week (07/29/2019) should be okay based on the current guidelines.  I routed this in the meantime for their input.    For clarification, I would be okay with patient coming into the clinic to see me as of 07/29/2019, but he may only need a lab visit.  Thanks.

## 2019-07-23 NOTE — Assessment & Plan Note (Signed)
He clearly feels better.  See above about attempted slow wean of oxygen down to 2 L at rest.  He will update me as needed.  He will finish his steroid taper.

## 2019-07-23 NOTE — Assessment & Plan Note (Signed)
Continue metoprolol and amlodipine. Losartan is on hold.   >25 minutes spent in face to face time with patient, >50% spent in counselling or coordination of care

## 2019-07-24 NOTE — Telephone Encounter (Signed)
done

## 2019-07-24 NOTE — Telephone Encounter (Signed)
Thanks for calling. I just pulled this message up.

## 2019-07-25 ENCOUNTER — Telehealth: Payer: Self-pay

## 2019-07-25 NOTE — Telephone Encounter (Signed)
Patient's wife, [name], called states patient has been using 2 liters of oxygen since Monday 07/21/2019. He wears it all the time except when he is using the bathroom, eating or showering. When he does take it off and leaves it off for longest time about 1 1/2 hours to do things mentioned above his O2 set stays at 95-97%. This morning patient has not had his Oxygen on since 8 am and still does not have it on now and his O2 is 97% still. The reason he has not used oxygen this morning because his wife went out to find something to clean the nasal canula with from any bacteria that maybe accumulated on it from touching things and she just returned.   So the question is should patient no longer use the oxygen, use at night only? What are your recommendations? Patient is not SOB and no cough. [name] states the doctor at the hospital told them that patient may need oxygen for 3 days only or maybe weeks, it will be 1 week tomorrow since been home.

## 2019-07-25 NOTE — Telephone Encounter (Signed)
3 situations: exertion, rest, sleep.   I would use O2 2-3L per [place] with exertion for now.  Goal sats ~95%.  If he is doing well at rest with RA and sats at ~95%, then continue off O2 at rest.  Can restart if needed.   I would still sleep on 2L O2 for now.    Please have him update me next week.   Thanks.

## 2019-07-25 NOTE — Telephone Encounter (Signed)
Wife advised. 

## 2019-07-29 ENCOUNTER — Other Ambulatory Visit: Payer: Self-pay

## 2019-07-29 ENCOUNTER — Other Ambulatory Visit (INDEPENDENT_AMBULATORY_CARE_PROVIDER_SITE_OTHER): Payer: Medicare Other

## 2019-07-29 ENCOUNTER — Ambulatory Visit: Payer: Medicare Other | Admitting: Cardiovascular Disease

## 2019-07-29 ENCOUNTER — Telehealth: Payer: Self-pay

## 2019-07-29 DIAGNOSIS — Z862 Personal history of diseases of the blood and blood-forming organs and certain disorders involving the immune mechanism: Secondary | ICD-10-CM

## 2019-07-29 LAB — COMPREHENSIVE METABOLIC PANEL
ALT: 19 U/L (ref 0–53)
AST: 12 U/L (ref 0–37)
Albumin: 2.6 g/dL — ABNORMAL LOW (ref 3.5–5.2)
Alkaline Phosphatase: 39 U/L (ref 39–117)
BUN: 23 mg/dL (ref 6–23)
CO2: 27 mEq/L (ref 19–32)
Calcium: 8 mg/dL — ABNORMAL LOW (ref 8.4–10.5)
Chloride: 102 mEq/L (ref 96–112)
Creatinine, Ser: 1.1 mg/dL (ref 0.40–1.50)
GFR: 64.35 mL/min (ref >60.00)
Glucose, Bld: 161 mg/dL — ABNORMAL HIGH (ref 70–99)
Potassium: 4.7 mEq/L (ref 3.5–5.1)
Sodium: 135 mEq/L (ref 135–145)
Total Bilirubin: 0.3 mg/dL (ref 0.2–1.2)
Total Protein: 4.8 g/dL — ABNORMAL LOW (ref 6.0–8.3)

## 2019-07-29 LAB — CBC WITH DIFFERENTIAL/PLATELET
Basophils Absolute: 0 10*3/uL (ref 0.0–0.1)
Basophils Relative: 0.1 % (ref 0.0–3.0)
Eosinophils Absolute: 0.2 10*3/uL (ref 0.0–0.7)
Eosinophils Relative: 0.9 % (ref 0.0–5.0)
HCT: 31 % — ABNORMAL LOW (ref 39.0–52.0)
Hemoglobin: 10.1 g/dL — ABNORMAL LOW (ref 13.0–17.0)
Lymphocytes Relative: 7.2 % — ABNORMAL LOW (ref 12.0–46.0)
Lymphs Abs: 1.4 10*3/uL (ref 0.7–4.0)
MCHC: 32.7 g/dL (ref 30.0–36.0)
MCV: 93.5 fl (ref 78.0–100.0)
Monocytes Absolute: 0.9 10*3/uL (ref 0.1–1.0)
Monocytes Relative: 4.7 % (ref 3.0–12.0)
Neutro Abs: 17 10*3/uL — ABNORMAL HIGH (ref 1.4–7.7)
Neutrophils Relative %: 87.1 % — ABNORMAL HIGH (ref 43.0–77.0)
Platelets: 172 10*3/uL (ref 150.0–400.0)
RBC: 3.32 Mil/uL — ABNORMAL LOW (ref 4.22–5.81)
RDW: 17.9 % — ABNORMAL HIGH (ref 11.5–15.5)
WBC: 19.5 10*3/uL (ref 4.0–10.5)

## 2019-07-29 LAB — IRON: Iron: 48 ug/dL (ref 42–165)

## 2019-07-29 NOTE — Telephone Encounter (Signed)
Call pt.  His labs look better. His kidney function is better.  He is still anemic, but that is improved.  His WBC is still high, but closer to normal than prev.  If he is gradually feeling better, then I would recheck his labs in 1 week.  I put in the orders.  I wouldn't change his meds in the meantime.  Update me as needed in the meantime.  Thanks.   He needs a lab appointment, with labs to be done at the car.

## 2019-07-29 NOTE — Telephone Encounter (Signed)
Patient advised.  Lab appt scheduled.  

## 2019-07-29 NOTE — Telephone Encounter (Signed)
Santiago Glad from [facility] called with Critical Lab WBC 19.5. Spoke to Dr Damita Dunnings. Forwarded this note to him as well.

## 2019-08-04 DIAGNOSIS — I10 Essential (primary) hypertension: Secondary | ICD-10-CM | POA: Diagnosis not present

## 2019-08-04 DIAGNOSIS — N182 Chronic kidney disease, stage 2 (mild): Secondary | ICD-10-CM | POA: Diagnosis not present

## 2019-08-04 DIAGNOSIS — N179 Acute kidney failure, unspecified: Secondary | ICD-10-CM | POA: Diagnosis not present

## 2019-08-04 DIAGNOSIS — D631 Anemia in chronic kidney disease: Secondary | ICD-10-CM | POA: Diagnosis not present

## 2019-08-05 ENCOUNTER — Other Ambulatory Visit: Payer: Self-pay

## 2019-08-05 ENCOUNTER — Other Ambulatory Visit (INDEPENDENT_AMBULATORY_CARE_PROVIDER_SITE_OTHER): Payer: Medicare Other

## 2019-08-05 DIAGNOSIS — Z862 Personal history of diseases of the blood and blood-forming organs and certain disorders involving the immune mechanism: Secondary | ICD-10-CM | POA: Diagnosis not present

## 2019-08-05 LAB — CBC WITH DIFFERENTIAL/PLATELET
Basophils Absolute: 0.1 10*3/uL (ref 0.0–0.1)
Basophils Relative: 1.4 % (ref 0.0–3.0)
Eosinophils Absolute: 0.1 10*3/uL (ref 0.0–0.7)
Eosinophils Relative: 1.1 % (ref 0.0–5.0)
HCT: 27.9 % — ABNORMAL LOW (ref 39.0–52.0)
Hemoglobin: 9 g/dL — ABNORMAL LOW (ref 13.0–17.0)
Lymphocytes Relative: 5.9 % — ABNORMAL LOW (ref 12.0–46.0)
Lymphs Abs: 0.6 10*3/uL — ABNORMAL LOW (ref 0.7–4.0)
MCHC: 32.4 g/dL (ref 30.0–36.0)
MCV: 92.6 fl (ref 78.0–100.0)
Monocytes Absolute: 0.6 10*3/uL (ref 0.1–1.0)
Monocytes Relative: 5.3 % (ref 3.0–12.0)
Neutro Abs: 9.1 10*3/uL — ABNORMAL HIGH (ref 1.4–7.7)
Neutrophils Relative %: 86.3 % — ABNORMAL HIGH (ref 43.0–77.0)
Platelets: 213 10*3/uL (ref 150.0–400.0)
RBC: 3.01 Mil/uL — ABNORMAL LOW (ref 4.22–5.81)
RDW: 18.4 % — ABNORMAL HIGH (ref 11.5–15.5)
WBC: 10.5 10*3/uL (ref 4.0–10.5)

## 2019-08-05 LAB — BASIC METABOLIC PANEL
BUN: 16 mg/dL (ref 6–23)
CO2: 23 mEq/L (ref 19–32)
Calcium: 8 mg/dL — ABNORMAL LOW (ref 8.4–10.5)
Chloride: 106 mEq/L (ref 96–112)
Creatinine, Ser: 1.02 mg/dL (ref 0.40–1.50)
GFR: 70.2 mL/min (ref >60.00)
Glucose, Bld: 192 mg/dL — ABNORMAL HIGH (ref 70–99)
Potassium: 4 mEq/L (ref 3.5–5.1)
Sodium: 136 mEq/L (ref 135–145)

## 2019-08-05 LAB — IRON: Iron: 16 ug/dL — ABNORMAL LOW (ref 42–165)

## 2019-08-07 ENCOUNTER — Telehealth: Payer: Self-pay | Admitting: *Deleted

## 2019-08-07 ENCOUNTER — Telehealth: Payer: Self-pay

## 2019-08-07 DIAGNOSIS — Z862 Personal history of diseases of the blood and blood-forming organs and certain disorders involving the immune mechanism: Secondary | ICD-10-CM

## 2019-08-07 MED ORDER — FERROUS SULFATE 325 (65 FE) MG PO TABS: 325.0000 mg | ORAL_TABLET | Freq: Every day | ORAL | Status: DC

## 2019-08-07 NOTE — Telephone Encounter (Signed)
Patient's wife, [name], called stating that patient had an episode at 1 am this morning of uncontrollable shaking of his legs. At that time wife checked his vitals and they were: B/P 114/47, O2 90%, temp 97.7. At that time carol increased patient's Oxygen to 4 liters and kept it at that level through the night and now. Patient was able to calm down and legs stop shaking around 2:30 am or so. I spoke with [name] and patient at 8:47 am and his vitals then were: b/p 139/59, Pulse 78, O2 92%. Patient is feeling well and better and no more shaking episodes. No chest pain, chest tightness, numbness or tingling or slurred speech. Patient did feel about 2 days ago that he was getting a head cold maybe, had slight cough but so far this morning [name] only heard patient cough slightly 1 time. Advised I would let PCP know for any recommendations.  [name] also wanted to ask if having some potato chips with some salt helps bring b/p up when it drops? A friend of theirs told them that and wanted to know if that is accurate? Patient did have some chips when his b/p was low as mentioned above.

## 2019-08-07 NOTE — Telephone Encounter (Signed)
Wife advised. 

## 2019-08-07 NOTE — Telephone Encounter (Signed)
I would try the iron as that may address the underlying issue.   Rest/fluids/tessalon for the cough would be reasonable.  If fevers or more sputum then let me know.  If he needs refill on tessalon, then let me and and I'll send in.  Thanks.

## 2019-08-07 NOTE — Telephone Encounter (Signed)
[name] (wife) states that patient is saying that he took something for RLS in the past that did not help and wanted to know if there was anything else he could try.  I reiterated that the iron deficiency may have something to do with those symptoms but I would pose this question to Dr. Damita Dunnings.  Wife also states that the patient's cough had completely went away but that he is now starting to cough again and she questions if it might be a cold and asks if there is anything OTC that he could take for the cold symptoms.

## 2019-08-07 NOTE — Telephone Encounter (Signed)
Also per [name] patient went to see his nephrologist on 08/04/2019 and his lungs were clear per the provider.

## 2019-08-07 NOTE — Telephone Encounter (Signed)
See lab report.    His white count normalized but this HGB is still low.  His iron is low and that could exacerbate RLS sx.   His renal function was fine.   See if he has been taking iron in the meantime.  If so, then let me know.  If not, then restart ferrous sulfate 325mg  a day.  Let me know if his leg sx continue.  I want to recheck his iron and cbc in about 2 weeks.  I put in the order.  I would continue O2 to keep sats >92% but I don't think that was the main issue with the leg sx.   Thanks.

## 2019-08-11 ENCOUNTER — Emergency Department: Payer: Medicare Other

## 2019-08-11 ENCOUNTER — Encounter: Payer: Self-pay | Admitting: Emergency Medicine

## 2019-08-11 ENCOUNTER — Inpatient Hospital Stay
Admission: EM | Admit: 2019-08-11 | Discharge: 2019-08-28 | DRG: 196 | Disposition: A | Discharge status: Home Health | Payer: Medicare Other | Attending: Internal Medicine | Admitting: Internal Medicine

## 2019-08-11 ENCOUNTER — Other Ambulatory Visit: Payer: Self-pay

## 2019-08-11 ENCOUNTER — Telehealth: Payer: Self-pay | Admitting: *Deleted

## 2019-08-11 DIAGNOSIS — D649 Anemia, unspecified: Secondary | ICD-10-CM | POA: Diagnosis not present

## 2019-08-11 DIAGNOSIS — J189 Pneumonia, unspecified organism: Secondary | ICD-10-CM | POA: Diagnosis not present

## 2019-08-11 DIAGNOSIS — J069 Acute upper respiratory infection, unspecified: Secondary | ICD-10-CM | POA: Diagnosis present

## 2019-08-11 DIAGNOSIS — J984 Other disorders of lung: Secondary | ICD-10-CM | POA: Diagnosis not present

## 2019-08-11 DIAGNOSIS — N183 Chronic kidney disease, stage 3 unspecified: Secondary | ICD-10-CM | POA: Diagnosis present

## 2019-08-11 DIAGNOSIS — Z823 Family history of stroke: Secondary | ICD-10-CM

## 2019-08-11 DIAGNOSIS — Z87891 Personal history of nicotine dependence: Secondary | ICD-10-CM | POA: Diagnosis not present

## 2019-08-11 DIAGNOSIS — N401 Enlarged prostate with lower urinary tract symptoms: Secondary | ICD-10-CM | POA: Diagnosis present

## 2019-08-11 DIAGNOSIS — J9601 Acute respiratory failure with hypoxia: Secondary | ICD-10-CM | POA: Diagnosis not present

## 2019-08-11 DIAGNOSIS — I480 Paroxysmal atrial fibrillation: Secondary | ICD-10-CM | POA: Diagnosis present

## 2019-08-11 DIAGNOSIS — Z7902 Long term (current) use of antithrombotics/antiplatelets: Secondary | ICD-10-CM

## 2019-08-11 DIAGNOSIS — I129 Hypertensive chronic kidney disease with stage 1 through stage 4 chronic kidney disease, or unspecified chronic kidney disease: Secondary | ICD-10-CM | POA: Diagnosis present

## 2019-08-11 DIAGNOSIS — E890 Postprocedural hypothyroidism: Secondary | ICD-10-CM | POA: Diagnosis not present

## 2019-08-11 DIAGNOSIS — Z880 Allergy status to penicillin: Secondary | ICD-10-CM

## 2019-08-11 DIAGNOSIS — J1289 Other viral pneumonia: Secondary | ICD-10-CM | POA: Diagnosis present

## 2019-08-11 DIAGNOSIS — I10 Essential (primary) hypertension: Secondary | ICD-10-CM | POA: Diagnosis not present

## 2019-08-11 DIAGNOSIS — Z9981 Dependence on supplemental oxygen: Secondary | ICD-10-CM

## 2019-08-11 DIAGNOSIS — J9691 Respiratory failure, unspecified with hypoxia: Secondary | ICD-10-CM

## 2019-08-11 DIAGNOSIS — R0902 Hypoxemia: Secondary | ICD-10-CM

## 2019-08-11 DIAGNOSIS — I2510 Atherosclerotic heart disease of native coronary artery without angina pectoris: Secondary | ICD-10-CM | POA: Diagnosis not present

## 2019-08-11 DIAGNOSIS — Z85850 Personal history of malignant neoplasm of thyroid: Secondary | ICD-10-CM | POA: Diagnosis not present

## 2019-08-11 DIAGNOSIS — D509 Iron deficiency anemia, unspecified: Secondary | ICD-10-CM | POA: Diagnosis present

## 2019-08-11 DIAGNOSIS — Z833 Family history of diabetes mellitus: Secondary | ICD-10-CM

## 2019-08-11 DIAGNOSIS — N400 Enlarged prostate without lower urinary tract symptoms: Secondary | ICD-10-CM | POA: Diagnosis not present

## 2019-08-11 DIAGNOSIS — N189 Chronic kidney disease, unspecified: Secondary | ICD-10-CM

## 2019-08-11 DIAGNOSIS — N1831 Chronic kidney disease, stage 3a: Secondary | ICD-10-CM | POA: Diagnosis not present

## 2019-08-11 DIAGNOSIS — J9622 Acute and chronic respiratory failure with hypercapnia: Secondary | ICD-10-CM | POA: Diagnosis present

## 2019-08-11 DIAGNOSIS — N179 Acute kidney failure, unspecified: Secondary | ICD-10-CM | POA: Diagnosis present

## 2019-08-11 DIAGNOSIS — U071 COVID-19: Secondary | ICD-10-CM

## 2019-08-11 DIAGNOSIS — N138 Other obstructive and reflux uropathy: Secondary | ICD-10-CM

## 2019-08-11 DIAGNOSIS — E039 Hypothyroidism, unspecified: Secondary | ICD-10-CM | POA: Diagnosis present

## 2019-08-11 DIAGNOSIS — E875 Hyperkalemia: Secondary | ICD-10-CM | POA: Diagnosis present

## 2019-08-11 DIAGNOSIS — T462X5A Adverse effect of other antidysrhythmic drugs, initial encounter: Secondary | ICD-10-CM | POA: Diagnosis not present

## 2019-08-11 DIAGNOSIS — I739 Peripheral vascular disease, unspecified: Secondary | ICD-10-CM | POA: Diagnosis present

## 2019-08-11 DIAGNOSIS — J1282 Pneumonia due to coronavirus disease 2019: Secondary | ICD-10-CM | POA: Diagnosis present

## 2019-08-11 DIAGNOSIS — E785 Hyperlipidemia, unspecified: Secondary | ICD-10-CM | POA: Diagnosis present

## 2019-08-11 DIAGNOSIS — J9621 Acute and chronic respiratory failure with hypoxia: Secondary | ICD-10-CM | POA: Diagnosis not present

## 2019-08-11 DIAGNOSIS — I340 Nonrheumatic mitral (valve) insufficiency: Secondary | ICD-10-CM | POA: Diagnosis not present

## 2019-08-11 DIAGNOSIS — J810 Acute pulmonary edema: Secondary | ICD-10-CM | POA: Diagnosis present

## 2019-08-11 DIAGNOSIS — G2581 Restless legs syndrome: Secondary | ICD-10-CM | POA: Diagnosis present

## 2019-08-11 DIAGNOSIS — J8410 Pulmonary fibrosis, unspecified: Principal | ICD-10-CM | POA: Diagnosis present

## 2019-08-11 DIAGNOSIS — J9602 Acute respiratory failure with hypercapnia: Secondary | ICD-10-CM

## 2019-08-11 DIAGNOSIS — Z951 Presence of aortocoronary bypass graft: Secondary | ICD-10-CM

## 2019-08-11 DIAGNOSIS — B948 Sequelae of other specified infectious and parasitic diseases: Secondary | ICD-10-CM

## 2019-08-11 DIAGNOSIS — J9690 Respiratory failure, unspecified, unspecified whether with hypoxia or hypercapnia: Secondary | ICD-10-CM

## 2019-08-11 DIAGNOSIS — Z79890 Hormone replacement therapy (postmenopausal): Secondary | ICD-10-CM

## 2019-08-11 DIAGNOSIS — R0602 Shortness of breath: Secondary | ICD-10-CM | POA: Diagnosis not present

## 2019-08-11 DIAGNOSIS — K219 Gastro-esophageal reflux disease without esophagitis: Secondary | ICD-10-CM | POA: Diagnosis present

## 2019-08-11 LAB — COMPREHENSIVE METABOLIC PANEL
ALT: 12 U/L (ref 0–44)
AST: 27 U/L (ref 15–41)
Albumin: 1.9 g/dL — ABNORMAL LOW (ref 3.5–5.0)
Alkaline Phosphatase: 49 U/L (ref 38–126)
Anion gap: 8 (ref 5–15)
BUN: 21 mg/dL (ref 8–23)
CO2: 20 mmol/L — ABNORMAL LOW (ref 22–32)
Calcium: 7.5 mg/dL — ABNORMAL LOW (ref 8.9–10.3)
Chloride: 109 mmol/L (ref 98–111)
Creatinine, Ser: 1.03 mg/dL (ref 0.61–1.24)
GFR calc Af Amer: >60 mL/min (ref >60)
GFR calc non Af Amer: >60 mL/min (ref >60)
Glucose, Bld: 108 mg/dL — ABNORMAL HIGH (ref 70–99)
Potassium: 3.9 mmol/L (ref 3.5–5.1)
Sodium: 137 mmol/L (ref 135–145)
Total Bilirubin: 0.4 mg/dL (ref 0.3–1.2)
Total Protein: 5.2 g/dL — ABNORMAL LOW (ref 6.5–8.1)

## 2019-08-11 LAB — C-REACTIVE PROTEIN: CRP: 25.9 mg/dL — ABNORMAL HIGH (ref <1.0)

## 2019-08-11 LAB — PROCALCITONIN: Procalcitonin: 0.19 ng/mL

## 2019-08-11 LAB — CBC WITH DIFFERENTIAL/PLATELET
Abs Immature Granulocytes: 0.54 10*3/uL — ABNORMAL HIGH (ref 0.00–0.07)
Basophils Absolute: 0.1 10*3/uL (ref 0.0–0.1)
Basophils Relative: 0 %
Eosinophils Absolute: 0 10*3/uL (ref 0.0–0.5)
Eosinophils Relative: 0 %
HCT: 24 % — ABNORMAL LOW (ref 39.0–52.0)
Hemoglobin: 7.5 g/dL — ABNORMAL LOW (ref 13.0–17.0)
Immature Granulocytes: 4 %
Lymphocytes Relative: 7 %
Lymphs Abs: 1 10*3/uL (ref 0.7–4.0)
MCH: 28.6 pg (ref 26.0–34.0)
MCHC: 31.3 g/dL (ref 30.0–36.0)
MCV: 91.6 fL (ref 80.0–100.0)
Monocytes Absolute: 0.9 10*3/uL (ref 0.1–1.0)
Monocytes Relative: 6 %
Neutro Abs: 12.4 10*3/uL — ABNORMAL HIGH (ref 1.7–7.7)
Neutrophils Relative %: 83 %
Platelets: 435 10*3/uL — ABNORMAL HIGH (ref 150–400)
RBC: 2.62 MIL/uL — ABNORMAL LOW (ref 4.22–5.81)
RDW: 17.7 % — ABNORMAL HIGH (ref 11.5–15.5)
WBC: 14.8 10*3/uL — ABNORMAL HIGH (ref 4.0–10.5)
nRBC: 0.1 % (ref 0.0–0.2)

## 2019-08-11 LAB — SARS CORONAVIRUS 2 (TAT 6-24 HRS): SARS Coronavirus 2: NEGATIVE

## 2019-08-11 LAB — IRON AND TIBC
Iron: 17 ug/dL — ABNORMAL LOW (ref 45–182)
Saturation Ratios: 12 % — ABNORMAL LOW (ref 17.9–39.5)
TIBC: 145 ug/dL — ABNORMAL LOW (ref 250–450)
UIBC: 128 ug/dL

## 2019-08-11 LAB — RETICULOCYTES
Immature Retic Fract: 14.2 % (ref 2.3–15.9)
RBC.: 2.64 MIL/uL — ABNORMAL LOW (ref 4.22–5.81)
Retic Count, Absolute: 28.8 10*3/uL (ref 19.0–186.0)
Retic Ct Pct: 1.1 % (ref 0.4–3.1)

## 2019-08-11 LAB — VITAMIN B12: Vitamin B-12: 724 pg/mL (ref 180–914)

## 2019-08-11 LAB — FERRITIN: Ferritin: 178 ng/mL (ref 24–336)

## 2019-08-11 LAB — TRIGLYCERIDES: Triglycerides: 98 mg/dL (ref <150)

## 2019-08-11 LAB — LACTATE DEHYDROGENASE: LDH: 327 U/L — ABNORMAL HIGH (ref 98–192)

## 2019-08-11 LAB — BRAIN NATRIURETIC PEPTIDE: B Natriuretic Peptide: 304 pg/mL — ABNORMAL HIGH (ref 0.0–100.0)

## 2019-08-11 LAB — FIBRINOGEN: Fibrinogen: >750 mg/dL — ABNORMAL HIGH (ref 210–475)

## 2019-08-11 LAB — FIBRIN DERIVATIVES D-DIMER (ARMC ONLY): Fibrin derivatives D-dimer (ARMC): 1111.73 ng/mL (FEU) — ABNORMAL HIGH (ref 0.00–499.00)

## 2019-08-11 LAB — POC SARS CORONAVIRUS 2 AG: SARS Coronavirus 2 Ag: NEGATIVE

## 2019-08-11 LAB — TROPONIN I (HIGH SENSITIVITY)
Troponin I (High Sensitivity): 13 ng/L (ref <18)
Troponin I (High Sensitivity): 13 ng/L (ref <18)

## 2019-08-11 LAB — LACTIC ACID, PLASMA
Lactic Acid, Venous: 1.1 mmol/L (ref 0.5–1.9)
Lactic Acid, Venous: 1.1 mmol/L (ref 0.5–1.9)

## 2019-08-11 LAB — FOLATE: Folate: 27 ng/mL (ref >5.9)

## 2019-08-11 LAB — HEPATITIS B SURFACE ANTIGEN: Hepatitis B Surface Ag: NONREACTIVE

## 2019-08-11 MED ORDER — ASCORBIC ACID 500 MG PO TABS
500.0000 mg | ORAL_TABLET | Freq: Every day | ORAL | Status: DC
  Administered 2019-08-12 – 2019-08-28 (×17): 500 mg via ORAL
  Filled 2019-08-11 (×17): qty 1

## 2019-08-11 MED ORDER — FERROUS SULFATE 325 (65 FE) MG PO TABS
325.0000 mg | ORAL_TABLET | Freq: Every day | ORAL | Status: DC
  Administered 2019-08-12 – 2019-08-14 (×3): 325 mg via ORAL
  Filled 2019-08-11 (×4): qty 1

## 2019-08-11 MED ORDER — IOHEXOL 350 MG/ML SOLN
75.0000 mL | Freq: Once | INTRAVENOUS | Status: AC | PRN
  Administered 2019-08-11: 75 mL via INTRAVENOUS

## 2019-08-11 MED ORDER — SODIUM CHLORIDE 0.9 % IV SOLN
2.0000 g | Freq: Once | INTRAVENOUS | Status: AC
  Administered 2019-08-11: 2 g via INTRAVENOUS
  Filled 2019-08-11: qty 2

## 2019-08-11 MED ORDER — ONDANSETRON HCL 4 MG PO TABS: 4.0000 mg | ORAL_TABLET | Freq: Four times a day (QID) | ORAL | Status: DC | PRN

## 2019-08-11 MED ORDER — FUROSEMIDE 10 MG/ML IJ SOLN
20.0000 mg | Freq: Once | INTRAMUSCULAR | Status: AC
  Administered 2019-08-12: 20 mg via INTRAVENOUS
  Filled 2019-08-11: qty 4

## 2019-08-11 MED ORDER — VITAMIN D 25 MCG (1000 UNIT) PO TABS
2000.0000 [IU] | ORAL_TABLET | Freq: Every day | ORAL | Status: DC
  Administered 2019-08-12 – 2019-08-28 (×17): 2000 [IU] via ORAL
  Filled 2019-08-11 (×17): qty 2

## 2019-08-11 MED ORDER — AMLODIPINE BESYLATE 5 MG PO TABS
2.5000 mg | ORAL_TABLET | Freq: Every day | ORAL | Status: DC
  Administered 2019-08-12 – 2019-08-24 (×12): 2.5 mg via ORAL
  Filled 2019-08-11 (×18): qty 1

## 2019-08-11 MED ORDER — VANCOMYCIN HCL 10 G IV SOLR
1250.0000 mg | INTRAVENOUS | Status: DC
  Administered 2019-08-12 – 2019-08-14 (×3): 1250 mg via INTRAVENOUS
  Filled 2019-08-11 (×3): qty 1250

## 2019-08-11 MED ORDER — DM-GUAIFENESIN ER 30-600 MG PO TB12: 1.0000 | ORAL_TABLET | Freq: Two times a day (BID) | ORAL | Status: DC

## 2019-08-11 MED ORDER — ALBUTEROL SULFATE HFA 108 (90 BASE) MCG/ACT IN AERS
2.0000 | INHALATION_SPRAY | RESPIRATORY_TRACT | Status: DC | PRN
  Administered 2019-08-12: 2 via RESPIRATORY_TRACT
  Filled 2019-08-11: qty 6.7

## 2019-08-11 MED ORDER — SODIUM CHLORIDE 0.9 % IV SOLN
2.0000 g | Freq: Three times a day (TID) | INTRAVENOUS | Status: DC
  Administered 2019-08-11 – 2019-08-13 (×5): 2 g via INTRAVENOUS
  Filled 2019-08-11 (×7): qty 2

## 2019-08-11 MED ORDER — TRAZODONE HCL 50 MG PO TABS
50.0000 mg | ORAL_TABLET | Freq: Every evening | ORAL | Status: DC | PRN
  Administered 2019-08-12: 50 mg via ORAL
  Administered 2019-08-12 – 2019-08-27 (×9): 100 mg via ORAL
  Filled 2019-08-11: qty 1
  Filled 2019-08-11 (×9): qty 2

## 2019-08-11 MED ORDER — ONDANSETRON HCL 4 MG/2ML IJ SOLN: 4.0000 mg | Freq: Four times a day (QID) | INTRAMUSCULAR | Status: DC | PRN

## 2019-08-11 MED ORDER — METOPROLOL TARTRATE 25 MG PO TABS
12.5000 mg | ORAL_TABLET | Freq: Two times a day (BID) | ORAL | Status: DC
  Administered 2019-08-12 – 2019-08-26 (×28): 12.5 mg via ORAL
  Filled 2019-08-11 (×34): qty 1

## 2019-08-11 MED ORDER — VANCOMYCIN HCL IN DEXTROSE 1-5 GM/200ML-% IV SOLN
1000.0000 mg | Freq: Once | INTRAVENOUS | Status: AC
  Administered 2019-08-11: 1000 mg via INTRAVENOUS
  Filled 2019-08-11: qty 200

## 2019-08-11 MED ORDER — GUAIFENESIN 100 MG/5ML PO SOLN
5.0000 mL | Freq: Once | ORAL | Status: AC
  Administered 2019-08-11: 100 mg via ORAL
  Filled 2019-08-11: qty 5

## 2019-08-11 MED ORDER — ENOXAPARIN SODIUM 40 MG/0.4ML ~~LOC~~ SOLN
40.0000 mg | SUBCUTANEOUS | Status: DC
  Administered 2019-08-11 – 2019-08-12 (×2): 40 mg via SUBCUTANEOUS
  Filled 2019-08-11 (×2): qty 0.4

## 2019-08-11 MED ORDER — OMEGA-3-ACID ETHYL ESTERS 1 G PO CAPS
1.0000 | ORAL_CAPSULE | Freq: Every day | ORAL | Status: DC
  Administered 2019-08-12 – 2019-08-28 (×17): 1 g via ORAL
  Filled 2019-08-11 (×17): qty 1

## 2019-08-11 MED ORDER — CLOPIDOGREL BISULFATE 75 MG PO TABS
75.0000 mg | ORAL_TABLET | Freq: Every day | ORAL | Status: DC
  Administered 2019-08-12 – 2019-08-28 (×17): 75 mg via ORAL
  Filled 2019-08-11 (×17): qty 1

## 2019-08-11 MED ORDER — IPRATROPIUM BROMIDE HFA 17 MCG/ACT IN AERS
2.0000 | INHALATION_SPRAY | RESPIRATORY_TRACT | Status: DC
  Administered 2019-08-11 – 2019-08-13 (×6): 2 via RESPIRATORY_TRACT
  Filled 2019-08-11 (×2): qty 12.9

## 2019-08-11 MED ORDER — LEVOTHYROXINE SODIUM 50 MCG PO TABS
75.0000 ug | ORAL_TABLET | ORAL | Status: DC
  Administered 2019-08-12 – 2019-08-27 (×14): 75 ug via ORAL
  Filled 2019-08-11 (×5): qty 2
  Filled 2019-08-11: qty 1
  Filled 2019-08-11 (×3): qty 2
  Filled 2019-08-11: qty 1
  Filled 2019-08-11: qty 2

## 2019-08-11 MED ORDER — POLYETHYLENE GLYCOL 3350 17 G PO PACK
17.0000 g | PACK | Freq: Every day | ORAL | Status: DC
  Administered 2019-08-12 – 2019-08-27 (×15): 17 g via ORAL
  Filled 2019-08-11 (×16): qty 1

## 2019-08-11 MED ORDER — AMIODARONE HCL 200 MG PO TABS: 200.0000 mg | ORAL_TABLET | Freq: Every day | ORAL | Status: DC

## 2019-08-11 MED ORDER — PANTOPRAZOLE SODIUM 40 MG PO TBEC
40.0000 mg | DELAYED_RELEASE_TABLET | Freq: Every day | ORAL | Status: DC
  Administered 2019-08-12: 40 mg via ORAL
  Filled 2019-08-11: qty 1

## 2019-08-11 MED ORDER — PRAVASTATIN SODIUM 20 MG PO TABS
40.0000 mg | ORAL_TABLET | Freq: Every day | ORAL | Status: DC
  Administered 2019-08-12 – 2019-08-27 (×16): 40 mg via ORAL
  Filled 2019-08-11 (×4): qty 2
  Filled 2019-08-11 (×2): qty 1
  Filled 2019-08-11: qty 2
  Filled 2019-08-11 (×2): qty 1
  Filled 2019-08-11 (×5): qty 2
  Filled 2019-08-11: qty 1
  Filled 2019-08-11: qty 2
  Filled 2019-08-11 (×2): qty 1
  Filled 2019-08-11: qty 2
  Filled 2019-08-11: qty 1
  Filled 2019-08-11 (×2): qty 2
  Filled 2019-08-11 (×2): qty 1

## 2019-08-11 MED ORDER — ADULT MULTIVITAMIN W/MINERALS CH
1.0000 | ORAL_TABLET | Freq: Every day | ORAL | Status: DC
  Administered 2019-08-12 – 2019-08-28 (×17): 1 via ORAL
  Filled 2019-08-11 (×17): qty 1

## 2019-08-11 MED ORDER — TAMSULOSIN HCL 0.4 MG PO CAPS
0.4000 mg | ORAL_CAPSULE | Freq: Every day | ORAL | Status: DC
  Administered 2019-08-12 – 2019-08-28 (×17): 0.4 mg via ORAL
  Filled 2019-08-11 (×17): qty 1

## 2019-08-11 MED ORDER — CO-ENZYME Q-10 100 MG PO CAPS: 100.0000 mg | ORAL_CAPSULE | Freq: Every day | ORAL | Status: DC

## 2019-08-11 MED ORDER — ACETAMINOPHEN 325 MG PO TABS
650.0000 mg | ORAL_TABLET | Freq: Four times a day (QID) | ORAL | Status: DC | PRN
  Administered 2019-08-13 – 2019-08-24 (×6): 650 mg via ORAL
  Filled 2019-08-11 (×6): qty 2

## 2019-08-11 MED ORDER — METHYLPREDNISOLONE SODIUM SUCC 125 MG IJ SOLR
80.0000 mg | Freq: Three times a day (TID) | INTRAMUSCULAR | Status: DC
  Administered 2019-08-11 – 2019-08-12 (×3): 80 mg via INTRAVENOUS
  Filled 2019-08-11 (×3): qty 2

## 2019-08-11 NOTE — Consult Note (Signed)
CODE SEPSIS - PHARMACY COMMUNICATION  **Broad Spectrum Antibiotics should be administered within 1 hour of Sepsis diagnosis**  Time Code Sepsis Called/Page Received: 1037  Antibiotics Ordered: Vancomycin/Aztreonam  Time of 1st antibiotic administration: 1136  Additional action taken by pharmacy: None  If necessary, Name of Provider/Nurse Contacted: Camilla, PharmD, BCPS Clinical Pharmacist 08/11/2019 11:38 AM

## 2019-08-11 NOTE — Progress Notes (Addendum)
Pharmacy Antibiotic Note  Ryan Pittman is a 78 y.o. male admitted on 08/11/2019 with increasing SOB at rest. COVID positive approximately 4 weeks ago, treated with remdesivir 11/5 - 11/9. Pharmacy has been consulted for vancomycin and aztreonam dosing for pneumonia.  Plan: Vancomycin 1250 mg IV Q 24 hrs  Goal AUC 400-550 Expected AUC: 523 SCr used: 1.03  Aztreonam 2 g IV q8h (PCN allergy listed as immediate rash, swelling of tongue/face/throat)  MRSA PCR ordered to help guide antibiotic therapy.   Height: 5\' 7"  (170.2 cm) Weight: 150 lb (68 kg) IBW/kg (Calculated) : 66.1  Temp (24hrs), Avg:98.2 F (36.8 C), Min:98.2 F (36.8 C), Max:98.2 F (36.8 C)  Recent Labs  Lab 08/05/19 1047 08/11/19 1003 08/11/19 1004 08/11/19 1143  WBC 10.5  --  14.8*  --   CREATININE 1.02  --  1.03  --   LATICACIDVEN  --  1.1  --  1.1    Estimated Creatinine Clearance: 53.5 mL/min (by C-G formula based on SCr of 1.03 mg/dL).    Allergies  Allergen Reactions  . Penicillins Itching and Other (See Comments)    PATIENT HAS HAD A PCN REACTION WITH IMMEDIATE RASH, FACIAL/TONGUE/THROAT SWELLING, SOB, OR LIGHTHEADEDNESS WITH HYPOTENSION:  #  #  YES  #  #  Has patient had a PCN reaction causing severe rash involving mucus membranes or skin necrosis: No Has patient had a PCN reaction that required hospitalization: No Has patient had a PCN reaction occurring within the last 10 years: No If all of the above answers are "NO", then may proceed with Cephalosporin use.   . Ace Inhibitors Cough  . Aspirin Other (See Comments)    H/o GI bleed  . Celebrex [Celecoxib] Other (See Comments)    GI bleed  . Lipitor [Atorvastatin] Other (See Comments)    myalgias    Antimicrobials this admission: Vancomycin 12/7 >> Aztreonam 12/7 >>  Dose adjustments this admission: NA  Microbiology results: 12/7 BCx: pending 12/7 MRSA PCR: pending 12/7 COVID19: pending (recently treated with remdesivir  11/5-11/9)  Thank you for allowing pharmacy to be a part of this patient's care.  Tawnya Crook, PharmD 08/11/2019 5:03 PM

## 2019-08-11 NOTE — ED Notes (Signed)
Pt resting on stretcher alert and calm with HFNC in place. denies pain currently. Pt continues to have increased work of breathing. Provided for comfort and safety and will continue to assess.

## 2019-08-11 NOTE — Telephone Encounter (Signed)
Noted. Thanks.

## 2019-08-11 NOTE — Telephone Encounter (Signed)
Patient's wife called stating that she is concerned about her husband. Mrs. Pettersen stated that the nurse told her that they needed to keep his oxygen level above 92%. Patient's wife stated that they can not get his O2 higher than 85%.Mrs. Givins stated that she had the company to check his machine to make sure it is working correctly and it is.  Mrs. Osada stated that his O2 level drops to 70% when he coughs. Mrs. Sadek stated that he has stayed in his recliner the last two days because he can hardly stand up. Mrs. Alison stated that he has low iron and may need some infusions. After speaking to Dr. Damita Dunnings Mrs. Matto was advised that her husband needs to go back to the ER. Mrs. Marschke stated that she will take him back to [facility] ER.

## 2019-08-11 NOTE — Telephone Encounter (Signed)
Mrs Stuller left v/m that she did call 911 and pt was taken to [facility] ED instead of [facility] ED. Pt did not have covid but pt has pneumonia in both lungs and pt was admitted to [facility]. FYI to Dr Damita Dunnings.

## 2019-08-11 NOTE — ED Triage Notes (Signed)
Pt arrived from home via [facility] with increasing shortness of breath at rest. Reports that he tested positive for COVID-19 abut 4 weeks ago.Reports nonproductive cough and rhonchi in bilateral lobes.

## 2019-08-11 NOTE — Consult Note (Addendum)
Name: PLEZ HUNDLEY MRN: [id number] DOB: 1941-08-20    ADMISSION DATE:  08/11/2019 CONSULTATION DATE: 08/11/2019  REFERRING MD : Dr. Blaine Hamper   CHIEF COMPLAINT: Shortness of Breath   BRIEF PATIENT DESCRIPTION:  78 yo male discharged from [facility] 07/19/19 following treatment of COVID-19 currently admitted with acute hypoxic respiratory failure secondary to multifocal pneumonia vs. pneumonitis and pulmonary edema requiring HFNC   SIGNIFICANT EVENTS/STUDIES:  12/7: CTA Chest revealed no evidence of pulmonary embolism. Moderate bilateral multifocal airspace process with small amount of bilateral pleural fluid. Findings are likely due to multifocal pneumonia. Aortic Atherosclerosis (ICD10-I70.0) and Emphysema (ICD10-J43.9). Atherosclerotic coronary artery disease. Several hepatic cysts with the largest over the left lobe measuring 4 cm. 12/7: Pt admitted to the stepdown unit on HFNC   CULTURES: COVID-19 12/7>>negative  POC COVID-19 12/7>>negative  MRSA PCR 12/7>> Blood x2 12/7>>  ANTIMICROBIALS: Vancomycin 12/7>> Aztreonam 12/7>>  HISTORY OF PRESENT ILLNESS:   This is an 78 yo male with a PMH of PVD, Pneumonia, Former Tobacco Abuse, IgG Gammopathy, HLD, HTN, Hypercalcemia, Dyspnea, CAD, Colon Polyps, CKD,Thyroid Cancer, Arthritis, Anginal Pain, Anemia, and GI Bleed secondary to Celebrex.  He presented to [facility] ER via EMS on 12/7 with worsening shortness of breath at rest.  Pt discharged from [facility] 07/19/19 following treatment of COVID-19 (while hospitalized received remdesivir/levaquin/iv steroids).  Pt discharged on home O2 @3L , prednisone taper, vitamin C, and vitamin D.  During current ER visit COVID-19 negative, however CXR concerning for atypical infection.  CTA Chest negative for pulmonary embolism, but revealed multifocal pneumonia.  Lab results revealed CO2 20, BNP 304, crp 25.0, pct 0.19, wbc 14.8, hgb 7.5, fibrinogen >750, and d-dimer 1,111.73.  Due to  severe hypoxia pt placed on HFNC.  He received aztreonam, vancomycin, and solumedrol.  He was subsequently admitted to the stepdown unit per hospitalist team and PCCM consulted for additional management.  PAST MEDICAL HISTORY :   has a past medical history of Anemia, Anginal pain ([place]), Arthritis, Cancer ([place]), Chronic kidney disease, Colon polyps, Coronary artery disease, COVID-19, Dyspnea, GERD (gastroesophageal reflux disease), Hypercalcemia, Hyperlipidemia, Hypertension, IgG gammopathy, Pneumonia, and PVD (peripheral vascular disease) ([place]).  has a past surgical history that includes Bypass Graft; Thyroidectomy, partial (2016); Colonoscopy with propofol (N/A, 05/04/2015); LEFT HEART CATH AND CORONARY ANGIOGRAPHY (N/A, 06/24/2018);  dental implant left bottom-did have bleeding ; Coronary artery bypass graft (N/A, 07/11/2018); TEE without cardioversion (N/A, 07/11/2018); and ABDOMINAL AORTOGRAM W/LOWER EXTREMITY (Bilateral, 06/26/2019). Prior to Admission medications   Medication Sig Start Date End Date Taking? Authorizing Provider  acetaminophen (TYLENOL) 500 MG tablet Take 1,000 mg by mouth every 6 (six) hours as needed for mild pain.   Yes [provider]  amiodarone (PACERONE) 200 MG tablet TAKE 1 TABLET BY MOUTH DAILY Patient taking differently: Take 200 mg by mouth daily.  02/24/19  Yes [name], MD  amLODipine (NORVASC) 2.5 MG tablet TAKE ONE TABLET BY MOUTH EVERY DAY Patient taking differently: Take 2.5 mg by mouth daily.  04/25/19  Yes Tonia Ghent, MD  benzonatate (TESSALON) 200 MG capsule Take 1 capsule (200 mg total) by mouth 3 (three) times daily as needed for cough. 07/07/19  Yes Tonia Ghent, MD  Cholecalciferol (VITAMIN D) 50 MCG (2000 UT) tablet Take 2,000 Units by mouth daily.   Yes [provider]  clopidogrel (PLAVIX) 75 MG tablet TAKE ONE TABLET EVERY DAY Patient taking differently: Take 75 mg by mouth daily.  10/04/18  Yes Damita Dunnings,  Elveria Rising, MD    Co-Enzyme Q-10 100 MG CAPS Take 100 mg by mouth daily.    Yes [provider]  Evolocumab (REPATHA SURECLICK) [id number] MG/ML SOAJ Inject 140 mg into the skin every 14 (fourteen) days. 02/17/19  Yes [name], MD  ferrous sulfate 325 (65 FE) MG tablet Take 1 tablet (325 mg total) by mouth daily with breakfast. 08/07/19  Yes Tonia Ghent, MD  levothyroxine (SYNTHROID, LEVOTHROID) 75 MCG tablet Take 75 mcg by mouth See admin instructions. Take 1 tablet (31mcg) by mouth every Monday, Tuesday, Wednesday, Friday, Saturday and Sunday morning and take 1 tablets (112.28mcg) by mouth every Thursday morning   Yes [provider]  metoprolol tartrate (LOPRESSOR) 25 MG tablet Take 0.5 tablets (12.5 mg total) by mouth 2 (two) times daily. 09/05/18  Yes [name], MD  Multiple Vitamins-Minerals (MULTIVITAMIN WITH MINERALS) tablet Take 1 tablet by mouth daily. 07/19/19 07/18/20 Yes Bonnielee Haff, MD  Omega-3 Fatty Acids (FISH OIL OMEGA-3 PO) Take 1,400 Units by mouth daily.    Yes [provider]  pantoprazole (PROTONIX) 40 MG tablet Take 1 tablet (40 mg total) by mouth daily. 07/20/19 08/19/19 Yes Bonnielee Haff, MD  polyethylene glycol (MIRALAX / GLYCOLAX) 17 g packet Take 17 g by mouth daily. 07/19/19  Yes Bonnielee Haff, MD  pravastatin (PRAVACHOL) 40 MG tablet TAKE ONE TABLET BY MOUTH EVERY DAY Patient taking differently: Take 40 mg by mouth daily.  06/10/19  Yes Tonia Ghent, MD  tamsulosin (FLOMAX) 0.4 MG CAPS capsule Take 1 capsule (0.4 mg total) by mouth daily. 11/20/18  Yes Stoioff, Ronda Fairly, MD  traZODone (DESYREL) 100 MG tablet Take 50-100 mg by mouth at bedtime as needed for sleep.  06/30/19  Yes [provider]  vitamin C (ASCORBIC ACID) 500 MG tablet Take 500 mg by mouth daily.   Yes [provider]   Allergies  Allergen Reactions   Penicillins Itching and Other (See Comments)    PATIENT HAS HAD A PCN REACTION WITH IMMEDIATE RASH,  FACIAL/TONGUE/THROAT SWELLING, SOB, OR LIGHTHEADEDNESS WITH HYPOTENSION:  #  #  YES  #  #  Has patient had a PCN reaction causing severe rash involving mucus membranes or skin necrosis: No Has patient had a PCN reaction that required hospitalization: No Has patient had a PCN reaction occurring within the last 10 years: No If all of the above answers are "NO", then may proceed with Cephalosporin use.    Ace Inhibitors Cough   Aspirin Other (See Comments)    H/o GI bleed   Celebrex [Celecoxib] Other (See Comments)    GI bleed   Lipitor [Atorvastatin] Other (See Comments)    myalgias    FAMILY HISTORY:  family history includes Diabetes in his mother; Stroke in his father. SOCIAL HISTORY:  reports that he quit smoking about 10 years ago. His smoking use included cigarettes. He has quit using smokeless tobacco.  His smokeless tobacco use included chew. He reports previous alcohol use. He reports that he does not use drugs.  REVIEW OF SYSTEMS: Positives in BOLD  Constitutional: Negative for fever, chills, weight loss, malaise/fatigue and diaphoresis.  HENT: Negative for hearing loss, ear pain, nosebleeds, congestion, sore throat, neck pain, tinnitus and ear discharge.   Eyes: Negative for blurred vision, double vision, photophobia, pain, discharge and redness.  Respiratory: cough, hemoptysis, sputum production, shortness of breath, wheezing and stridor.   Cardiovascular: Negative for chest pain, palpitations, orthopnea, claudication, leg swelling and PND.  Gastrointestinal: Negative for heartburn, nausea, vomiting, abdominal pain, diarrhea, constipation, blood in stool and melena.  Genitourinary: Negative for dysuria, urgency, frequency, hematuria and flank pain.  Musculoskeletal: Negative for myalgias, back pain, joint pain and falls.  Skin: Negative for itching and rash.  Neurological: Negative for dizziness, tingling, tremors, sensory change, speech change, focal weakness, seizures,  loss of consciousness, weakness and headaches.  Endo/Heme/Allergies: Negative for environmental allergies and polydipsia. Does not bruise/bleed easily.  SUBJECTIVE:  States he needs something for sleep tonight due to anxiety.  States breathing has improved.    VITAL SIGNS: Temp:  [98.2 F (36.8 C)] 98.2 F (36.8 C) (12/07 1005) Pulse Rate:  [71-94] 94 (12/07 2001) Resp:  [16-30] 22 (12/07 2001) BP: (126-162)/(45-89) 138/89 (12/07 2001) SpO2:  [91 %-100 %] 91 % (12/07 2001) FiO2 (%):  [65 %] 65 % (12/07 1539) Weight:  [68 kg] 68 kg (12/07 0958)  PHYSICAL EXAMINATION: General: acutely ill appearing male, NAD on HFNC  Neuro: alert and oriented, follows commands  HEENT: supple, no JVD Cardiovascular: nsr, rrr, no R/G  Lungs: diminished throughout, even, non labored  Abdomen: +BS x4, soft, non tender, non distended  Musculoskeletal: normal bulk and tone, no edema  Skin: intact no rashes or lesions present   Recent Labs  Lab 08/05/19 1047 08/11/19 1004  NA 136 137  K 4.0 3.9  CL 106 109  CO2 23 20*  BUN 16 21  CREATININE 1.02 1.03  GLUCOSE 192* 108*   Recent Labs  Lab 08/05/19 1047 08/11/19 1004  HGB 9.0* 7.5*  HCT 27.9* 24.0*  WBC 10.5 14.8*  PLT 213.0 435*   Ct Angio Chest Pe W And/or Wo Contrast  Result Date: 08/11/2019 CLINICAL DATA:  Shortness of breath worsening. COVID-19 positive 4 weeks ago. Nonproductive cough. EXAM: CT ANGIOGRAPHY CHEST WITH CONTRAST TECHNIQUE: Multidetector CT imaging of the chest was performed using the standard protocol during bolus administration of intravenous contrast. Multiplanar CT image reconstructions and MIPs were obtained to evaluate the vascular anatomy. CONTRAST:  43mL OMNIPAQUE IOHEXOL 350 MG/ML SOLN COMPARISON:  Cardiac CT 05/30/2018 and noncontrast chest CT 04/28/2014 FINDINGS: Cardiovascular: Heart is normal size. Calcified plaque over the coronary arteries. Evidence of previous CABG. Mild calcified plaque over the thoracic  aorta which is otherwise normal in caliber. Pulmonary arterial system is adequately opacified without evidence of emboli. Remaining vascular structures are unremarkable. Mediastinum/Nodes: Approximate 1 cm subcarinal lymph node is present likely reactive. 1 cm right hilar lymph node likely reactive. Remaining mediastinal structures are unremarkable. Lungs/Pleura: Lungs are adequately inflated and demonstrate bilateral moderate hazy airspace process likely due to multifocal pneumonia. Small amount of bilateral pleural fluid. Paracentral emphysematous disease over the upper lobes. Airways are unremarkable. Upper Abdomen: Several liver cysts with the largest over the left lobe measuring 4 cm. Calcified plaque over the abdominal aorta. No acute findings. Musculoskeletal: No acute findings. Review of the MIP images confirms the above findings. IMPRESSION: 1.  No evidence of pulmonary embolism. 2. Moderate bilateral multifocal airspace process with small amount of bilateral pleural fluid. Findings are likely due to multifocal pneumonia. 3. Aortic Atherosclerosis (ICD10-I70.0) and Emphysema (ICD10-J43.9). Atherosclerotic coronary artery disease. 4. Several hepatic cysts with the largest over the left lobe measuring 4 cm. Electronically Signed   By: Marin Olp M.D.   On: 08/11/2019 13:24   Dg Chest Portable 1 View  Result Date: 08/11/2019 CLINICAL DATA:  Shortness of breath. COVID-19. EXAM: PORTABLE CHEST 1 VIEW COMPARISON:  July 13, 2019. FINDINGS: The  heart size and mediastinal contours are within normal limits. Status post coronary bypass graft. No pneumothorax or pleural effusion is noted. Increased diffuse reticular densities are noted throughout both lungs concerning for worsening atypical infection. The visualized skeletal structures are unremarkable. IMPRESSION: Increased diffuse reticular densities are noted throughout both lungs concerning for worsening atypical infection. Electronically Signed   By:  Marijo Conception M.D.   On: 08/11/2019 10:32    ASSESSMENT / PLAN:  Acute hypoxic respiratory failure secondary to multifocal pneumonia vs. pneumonitis and pulmonary edema Recent dx COVID-19 (discharged for [facility] 07/19/2019 repeat COVID-19 negative 08/11/2019) Supplemental O2 for dyspnea and/or hypoxia  Scheduled and prn bronchodilator therapy  IV steroids  20 mg iv lasix x1 dose  Trend WBC and monitor fever curve  Trend PCT Follow cultures  Continue vancomycin and aztreonam  Continue vitamin C and D Echo pending   HTN-controlled Hx: HLD, PVD, and CAD s/p CABG  Continuous telemetry monitoring   Continue outpatient amlodipine, clopidogrel, and pravastatin Will hold outpatient amiodarone for now   Chronic kidney disease stage III (baseline creatinine 1.3-1.5) Trend BMP  Replace electrolytes as indicated  Monitor UOP Avoid nephrotoxic medications   Acute on chronic anemia  VTE px: subq lovenox Trend CBC  Monitor for s/sx of bleeding and transfuse for hgb <7  GERD  Continue protonix    Marda Stalker, [place] Pager [phone] (please enter 7 digits) PCCM Consult Pager [phone] (please enter 7 digits)

## 2019-08-11 NOTE — ED Notes (Addendum)
Pt standby by assisted to the restroom, O2 taken off and with exertion pts O2 dropped to 58-78%. Pt was assisted back to bed and placed back onto high flow oxygen. Pt currently sitting in bed updating his family. NAD noted at this time. Pt reports feeling "okay" but states his breathing is where he's having most of his difficulties

## 2019-08-11 NOTE — ED Notes (Signed)
Pt given meal tray.

## 2019-08-11 NOTE — H&P (Signed)
History and Physical    Ryan Pittman [id number] DOB: 1941-08-07 DOA: 08/11/2019  Referring MD/NP/PA:   PCP: Tonia Ghent, MD   Patient coming from:  The patient is coming from home.  At baseline, pt is independent for most of ADL.        Chief Complaint: SOB  HPI: Ryan Pittman is a 78 y.o. male with medical history significant of hypertension, hyperlipidemia, GERD, hypothyroidism, PVD, CAD, CABG, anemia, CKD stage III, PAF, who presents with shortness of breath.  pt was recently hospitalized from 11/4-11/15 due to COVID-19 infection. Pt was treated with levofloxacin, steroid and remdesivir. He was discharged with home oxygen.  Patient states that he still have her shortness of breath which has worsened in the past several days.  He has dry cough, but no chest pain fever or chills.  Per report, patient has oxygen desaturation to 60% when off nasal cannula oxygen.  Patient does not have nausea vomiting, diarrhea, abdominal pain.  No symptoms of UTI or unilateral weakness.  ED Course: pt was found to have COVID-19 Ag negative, pending PCR test, WBC 14.8, lactic acid 1.1, troponin 13, electrolytes renal function okay, temperature 98.2, blood pressure 143/61, heart rate 78, RR 25, chest x-ray showed atypical infiltration.  CT angiogram is negative for PE, showed bilateral multifocal infiltration.  Patient is admitted to stepdown as inpatient.  PCCM, Dr. Patsey Berthold was consulted.  Review of Systems:   General: no fevers, chills, no body weight gain, has fatigue HEENT: no blurry vision, hearing changes or sore throat Respiratory: has dyspnea, coughing, no wheezing CV: no chest pain, no palpitations GI: no nausea, vomiting, abdominal pain, diarrhea, constipation GU: no dysuria, burning on urination, increased urinary frequency, hematuria  Ext: no leg edema Neuro: no unilateral weakness, numbness, or tingling, no vision change or hearing loss Skin: no rash, no skin tear. MSK: No  muscle spasm, no deformity, no limitation of range of movement in spin Heme: No easy bruising.  Travel history: No recent long distant travel.  Allergy:  Allergies  Allergen Reactions   Penicillins Itching and Other (See Comments)    PATIENT HAS HAD A PCN REACTION WITH IMMEDIATE RASH, FACIAL/TONGUE/THROAT SWELLING, SOB, OR LIGHTHEADEDNESS WITH HYPOTENSION:  #  #  YES  #  #  Has patient had a PCN reaction causing severe rash involving mucus membranes or skin necrosis: No Has patient had a PCN reaction that required hospitalization: No Has patient had a PCN reaction occurring within the last 10 years: No If all of the above answers are "NO", then may proceed with Cephalosporin use.    Ace Inhibitors Cough   Aspirin Other (See Comments)    H/o GI bleed   Celebrex [Celecoxib] Other (See Comments)    GI bleed   Lipitor [Atorvastatin] Other (See Comments)    myalgias    Past Medical History:  Diagnosis Date   Anemia    due to GIB, s/p transfusion   Anginal pain (HCC)    Arthritis    back pain, much worse after consecutive golf rounds   Cancer [facility])    thyroid   Chronic kidney disease    Colon polyps    Coronary artery disease    COVID-19    Dyspnea    walking up a hill   GERD (gastroesophageal reflux disease)    Hypercalcemia    h/o, resolved as of 2012, prev due to high amount of calcium intake   Hyperlipidemia    Hypertension  IgG gammopathy    stable as of 2012 per [facility]   Pneumonia    as a child   PVD (peripheral vascular disease) ([place])    L leg bypass, R leg stented    Past Surgical History:  Procedure Laterality Date    dental implant left bottom-did have bleeding      ABDOMINAL AORTOGRAM W/LOWER EXTREMITY Bilateral 06/26/2019   Procedure: ABDOMINAL AORTOGRAM W/LOWER EXTREMITY;  Surgeon: [name], MD;  Location: [facility];  Service: Cardiovascular;  Laterality: Bilateral;   BYPASS GRAFT     L leg   COLONOSCOPY WITH  PROPOFOL N/A 05/04/2015   Procedure: COLONOSCOPY WITH PROPOFOL;  Surgeon: Lollie Sails, MD;  Location[facility] ENDOSCOPY;  Service: Endoscopy;  Laterality: N/A;   CORONARY ARTERY BYPASS GRAFT N/A 07/11/2018   Procedure: CORONARY ARTERY BYPASS GRAFTING (CABG) x three, using left internal mammary artery and right leg greater saphenous vein harvested endoscopically;  Surgeon: Gaye Pollack, MD;  Location: [facility] OR;  Service: Open Heart Surgery;  Laterality: N/A;   LEFT HEART CATH AND CORONARY ANGIOGRAPHY N/A 06/24/2018   Procedure: LEFT HEART CATH AND CORONARY ANGIOGRAPHY;  Surgeon: [name], MD;  Location: [place] [facility];  Service: Cardiovascular;  Laterality: N/A;   TEE WITHOUT CARDIOVERSION N/A 07/11/2018   Procedure: TRANSESOPHAGEAL ECHOCARDIOGRAM (TEE);  Surgeon: Gaye Pollack, MD;  Location: [facility];  Service: Open Heart Surgery;  Laterality: N/A;   THYROIDECTOMY, PARTIAL  2016    Social History:  reports that he quit smoking about 10 years ago. His smoking use included cigarettes. He has quit using smokeless tobacco.  His smokeless tobacco use included chew. He reports previous alcohol use. He reports that he does not use drugs.  Family History:  Family History  Problem Relation Age of Onset   Diabetes Mother    Stroke Father    Colon cancer Neg Hx    Prostate cancer Neg Hx      Prior to Admission medications   Medication Sig Start Date End Date Taking? Authorizing Provider  acetaminophen (TYLENOL) 500 MG tablet Take 1,000 mg by mouth every 6 (six) hours as needed for mild pain.    [provider]  amiodarone (PACERONE) 200 MG tablet TAKE 1 TABLET BY MOUTH DAILY 02/24/19   [name], MD  amLODipine (NORVASC) 2.5 MG tablet TAKE ONE TABLET BY MOUTH EVERY DAY 04/25/19   Tonia Ghent, MD  benzonatate (TESSALON) 200 MG capsule Take 1 capsule (200 mg total) by mouth 3 (three) times daily as needed for cough. 07/07/19   Tonia Ghent, MD    Cholecalciferol (VITAMIN D) 50 MCG (2000 UT) tablet Take 2,000 Units by mouth daily.    [provider]  clopidogrel (PLAVIX) 75 MG tablet TAKE ONE TABLET EVERY DAY 10/04/18   Tonia Ghent, MD  Co-Enzyme Q-10 100 MG CAPS Take 100 mg by mouth daily.     [provider]  Evolocumab (REPATHA SURECLICK) [id number] MG/ML SOAJ Inject 140 mg into the skin every 14 (fourteen) days. 02/17/19   [name], MD  ferrous sulfate 325 (65 FE) MG tablet Take 1 tablet (325 mg total) by mouth daily with breakfast. 08/07/19   Tonia Ghent, MD  levothyroxine (SYNTHROID, LEVOTHROID) 75 MCG tablet Take 75 mcg by mouth daily before breakfast. EXCEPT: Takes 112.5 mg  every Thursday per pt    [provider]  metoprolol tartrate (LOPRESSOR) 25 MG tablet Take 0.5 tablets (12.5  mg total) by mouth 2 (two) times daily. 09/05/18   [name], MD  Multiple Vitamins-Minerals (MULTIVITAMIN WITH MINERALS) tablet Take 1 tablet by mouth daily. 07/19/19 07/18/20  Bonnielee Haff, MD  Omega-3 Fatty Acids (FISH OIL OMEGA-3 PO) Take 1,400 Units by mouth daily.     [provider]  pantoprazole (PROTONIX) 40 MG tablet Take 1 tablet (40 mg total) by mouth daily. 07/20/19 08/19/19  Bonnielee Haff, MD  polyethylene glycol (MIRALAX / GLYCOLAX) 17 g packet Take 17 g by mouth daily. 07/19/19   Bonnielee Haff, MD  pravastatin (PRAVACHOL) 40 MG tablet TAKE ONE TABLET BY MOUTH EVERY DAY Patient taking differently: Take 40 mg by mouth daily.  06/10/19   Tonia Ghent, MD  predniSONE (DELTASONE) 20 MG tablet Take 3 tablets once daily for 3 days followed by 2 tablets once daily for 3 days followed by 1 tablet once daily for 3 days and then stop 07/19/19   Bonnielee Haff, MD  tamsulosin (FLOMAX) 0.4 MG CAPS capsule Take 1 capsule (0.4 mg total) by mouth daily. 11/20/18   Stoioff, Ronda Fairly, MD  traZODone (DESYREL) 100 MG tablet Take 50-100 mg by mouth at bedtime as needed for sleep.  06/30/19   [provider]  vitamin C (ASCORBIC ACID) 500 MG tablet Take 500 mg by mouth daily.    [provider]    Physical Exam: Vitals:   08/11/19 1630 08/11/19 1700 08/11/19 1730 08/11/19 1830  BP: (!) 130/58 126/60 133/71 (!) 133/57  Pulse: 86 92 78 86  Resp: (!) 28 (!) 27 (!) 30 (!) 28  Temp:      TempSrc:      SpO2: 92% 92% 96% 91%  Weight:      Height:       General: Not in acute distress HEENT:       Eyes: PERRL, EOMI, no scleral icterus.       ENT: No discharge from the ears and nose, no pharynx injection, no tonsillar enlargement.        Neck: No JVD, no bruit, no mass felt. Heme: No neck lymph node enlargement. Cardiac: S1/S2, RRR, No murmurs, No gallops or rubs. Respiratory: has rhonchi bilaterally GI: Soft, nondistended, nontender, no rebound pain, no organomegaly, BS present. GU: No hematuria Ext: No pitting leg edema bilaterally. 2+DP/PT pulse bilaterally. Musculoskeletal: No joint deformities, No joint redness or warmth, no limitation of ROM in spin. Skin: No rashes.  Neuro: Alert, oriented X3, cranial nerves II-XII grossly intact, moves all extremities normally.  Psych: Patient is not psychotic, no suicidal or hemocidal ideation.  Labs on Admission: I have personally reviewed following labs and imaging studies  CBC: Recent Labs  Lab 08/05/19 1047 08/11/19 1004  WBC 10.5 14.8*  NEUTROABS 9.1* 12.4*  HGB 9.0* 7.5*  HCT 27.9* 24.0*  MCV 92.6 91.6  PLT 213.0 [id number]*   Basic Metabolic Panel: Recent Labs  Lab 08/05/19 1047 08/11/19 1004  NA 136 137  K 4.0 3.9  CL 106 109  CO2 23 20*  GLUCOSE 192* 108*  BUN 16 21  CREATININE 1.02 1.03  CALCIUM 8.0* 7.5*   GFR: Estimated Creatinine Clearance: 53.5 mL/min (by C-G formula based on SCr of 1.03 mg/dL). Liver Function Tests: Recent Labs  Lab 08/11/19 1004  AST 27  ALT 12  ALKPHOS 49  BILITOT 0.4  PROT 5.2*  ALBUMIN 1.9*   No results for input(s): LIPASE, AMYLASE in the last 168 hours. No  results for input(s):  AMMONIA in the last 168 hours. Coagulation Profile: No results for input(s): INR, PROTIME in the last 168 hours. Cardiac Enzymes: No results for input(s): CKTOTAL, CKMB, CKMBINDEX, TROPONINI in the last 168 hours. BNP (last 3 results) No results for input(s): PROBNP in the last 8760 hours. HbA1C: No results for input(s): HGBA1C in the last 72 hours. CBG: No results for input(s): GLUCAP in the last 168 hours. Lipid Profile: Recent Labs    08/11/19 1004  TRIG 98   Thyroid Function Tests: No results for input(s): TSH, T4TOTAL, FREET4, T3FREE, THYROIDAB in the last 72 hours. Anemia Panel: Recent Labs    08/11/19 1004  FERRITIN 178   Urine analysis:    Component Value Date/Time   COLORURINE YELLOW 07/10/2019 0007   APPEARANCEUR HAZY (A) 07/10/2019 0007   LABSPEC 1.026 07/10/2019 0007   PHURINE 5.0 07/10/2019 0007   GLUCOSEU NEGATIVE 07/10/2019 0007   HGBUR NEGATIVE 07/10/2019 0007   BILIRUBINUR NEGATIVE 07/10/2019 0007   KETONESUR NEGATIVE 07/10/2019 0007   PROTEINUR >=300 (A) 07/10/2019 0007   NITRITE NEGATIVE 07/10/2019 0007   LEUKOCYTESUR NEGATIVE 07/10/2019 0007   Sepsis Labs: @LABRCNTIP (procalcitonin:4,lacticidven:4) ) Recent Results (from the past 240 hour(s))  SARS CORONAVIRUS 2 (TAT 6-24 HRS) Nasopharyngeal Nasopharyngeal Swab     Status: None   Collection Time: 08/11/19 10:00 AM   Specimen: Nasopharyngeal Swab  Result Value Ref Range Status   SARS Coronavirus 2 NEGATIVE NEGATIVE Final    Comment: (NOTE) SARS-CoV-2 target nucleic acids are NOT DETECTED. The SARS-CoV-2 RNA is generally detectable in upper and lower respiratory specimens during the acute phase of infection. Negative results do not preclude SARS-CoV-2 infection, do not rule out co-infections with other pathogens, and should not be used as the sole basis for treatment or other patient management decisions. Negative results must be combined with clinical  observations, patient history, and epidemiological information. The expected result is Negative. Fact Sheet for Patients: SugarRoll.be Fact Sheet for Healthcare Providers: https://www.woods-mathews.com/ This test is not yet approved or cleared by the [place] FDA and  has been authorized for detection and/or diagnosis of SARS-CoV-2 by FDA under an Emergency Use Authorization (EUA). This EUA will remain  in effect (meaning this test can be used) for the duration of the COVID-19 declaration under Section 56 4(b)(1) of the Act, 21 U.S.C. section 360bbb-3(b)(1), unless the authorization is terminated or revoked sooner. Performed at [facility], [place] [address] [place], [place] [address]      Radiological Exams on Admission: Ct Angio Chest Pe W And/or Wo Contrast  Result Date: 08/11/2019 CLINICAL DATA:  Shortness of breath worsening. COVID-19 positive 4 weeks ago. Nonproductive cough. EXAM: CT ANGIOGRAPHY CHEST WITH CONTRAST TECHNIQUE: Multidetector CT imaging of the chest was performed using the standard protocol during bolus administration of intravenous contrast. Multiplanar CT image reconstructions and MIPs were obtained to evaluate the vascular anatomy. CONTRAST:  28mL OMNIPAQUE IOHEXOL 350 MG/ML SOLN COMPARISON:  Cardiac CT 05/30/2018 and noncontrast chest CT 04/28/2014 FINDINGS: Cardiovascular: Heart is normal size. Calcified plaque over the coronary arteries. Evidence of previous CABG. Mild calcified plaque over the thoracic aorta which is otherwise normal in caliber. Pulmonary arterial system is adequately opacified without evidence of emboli. Remaining vascular structures are unremarkable. Mediastinum/Nodes: Approximate 1 cm subcarinal lymph node is present likely reactive. 1 cm right hilar lymph node likely reactive. Remaining mediastinal structures are unremarkable. Lungs/Pleura: Lungs are adequately inflated and demonstrate bilateral  moderate hazy airspace process likely due to multifocal pneumonia. Small amount  of bilateral pleural fluid. Paracentral emphysematous disease over the upper lobes. Airways are unremarkable. Upper Abdomen: Several liver cysts with the largest over the left lobe measuring 4 cm. Calcified plaque over the abdominal aorta. No acute findings. Musculoskeletal: No acute findings. Review of the MIP images confirms the above findings. IMPRESSION: 1.  No evidence of pulmonary embolism. 2. Moderate bilateral multifocal airspace process with small amount of bilateral pleural fluid. Findings are likely due to multifocal pneumonia. 3. Aortic Atherosclerosis (ICD10-I70.0) and Emphysema (ICD10-J43.9). Atherosclerotic coronary artery disease. 4. Several hepatic cysts with the largest over the left lobe measuring 4 cm. Electronically Signed   By: Marin Olp M.D.   On: 08/11/2019 13:24   Dg Chest Portable 1 View  Result Date: 08/11/2019 CLINICAL DATA:  Shortness of breath. COVID-19. EXAM: PORTABLE CHEST 1 VIEW COMPARISON:  July 13, 2019. FINDINGS: The heart size and mediastinal contours are within normal limits. Status post coronary bypass graft. No pneumothorax or pleural effusion is noted. Increased diffuse reticular densities are noted throughout both lungs concerning for worsening atypical infection. The visualized skeletal structures are unremarkable. IMPRESSION: Increased diffuse reticular densities are noted throughout both lungs concerning for worsening atypical infection. Electronically Signed   By: Marijo Conception M.D.   On: 08/11/2019 10:32     EKG: Independently reviewed.  Sinus rhythm, QTC 466, low voltage, nonspecific T wave change  Assessment/Plan Principal Problem:   Multifocal pneumonia Active Problems:   CKD (chronic kidney disease) stage 3, GFR 30-59 ml/min   HTN (hypertension)   Hyperlipidemia   Hypothyroidism   PAF (paroxysmal atrial fibrillation) (HCC)   CAD (coronary artery disease)    Pneumonia due to COVID-19 virus   Normocytic anemia   Acute respiratory disease due to COVID-19 virus  Acute respiratory failure with hypoxia due to multifocal pneumonia and recent acute respiratory disease due to COVID-19 virus: some inflammatory marks are still elevated. Consulted PCCM, I talked to Dr. Patsey Berthold, she recommended to start solumedrol 80 mg tid and continue Abx.  -will admit to SDU as inpt -Solumedrol 80 mg tid - vanco and aztreonam -bronchodilators -PRN Mucinex for cough -f/u Blood culture -D-dimer, BNP,Trop, LFT (check CMP), CRP, LDH, Procalcitonin, ferritin, fibinogen, TG, Hep B SAg -Daily CRP, Ferritin, D-dimer, -Will ask the patient to maintain an awake prone position for 16+ hours a day, if possible, with a minimum of 2-3 hours at a time -f/u PPCM futher recommendation  CKD (chronic kidney disease) stage 3a, GFR 30-59 ml/min: stable. Cre 1.03 and BUN 21 -f/u by BMP  HTN (hypertension): -continue home meds.  Hyperlipidemia: -statin  Hypothyroidism: -synthroid  PAF (paroxysmal atrial fibrillation) ([place]): -metoprolol -amiodarone  Hx of CAD (coronary artery disease): -on plavix and statin  Normocytic anemia: Hgb 9.0 recently -->7.5 -check anemia panel -continue iron supplement    Inpatient status:  # Patient requires inpatient status due to high intensity of service, high risk for further deterioration and high frequency of surveillance required.  I certify that at the point of admission it is my clinical judgment that the patient will require inpatient hospital care spanning beyond 2 midnights from the point of admission.   This patient has multiple chronic comorbidities including hypertension, hyperlipidemia, GERD, hypothyroidism, PVD, CAD, CABG, anemia, CKD stage III, PAF, recently treated covid 19 infection  Now patient has presenting with Acute respiratory failure with hypoxia due to mulmultifocal pneumonia and recent acute respiratory disease  due to COVID-19 virus  The worrisome physical exam findings include rhonchi on lung auscultation.  The initial radiographic and laboratory data are worrisome because of multifocal infiltration on CTA  Current medical needs: please see my assessment and plan  Predictability of an adverse outcome (risk): pt has acute respiratory failure with hypoxia due to mulmultifocal pneumonia and recent acute respiratory disease due to COVID-19 virus. Pt is at high risk of deteriorating. Will need to be treated in hospital for at least for two days.       DVT ppx:  SQ Lovenox Code Status: Full code Family Communication: None at bed side. Disposition Plan:  Anticipate discharge back to previous home environment Consults called:  PCCM, Dr. Patsey Berthold Admission status:  SDU/inpation       Date of Service 08/11/2019    [facility]   If 7PM-7AM, please contact night-coverage [email or web address] Password [email or web address] 08/11/2019, 7:00 PM

## 2019-08-11 NOTE — ED Notes (Addendum)
This nurse assisted pt with using bedside urinal, pt is steady on feet. Pt standing by the bed increased dyspnea and labored breathing, SpO2 dropped from 96% to 90%. After lying in bed and taking a few deep breaths SpO2 returned to 96-97%.

## 2019-08-11 NOTE — Consult Note (Signed)
PHARMACY -  BRIEF ANTIBIOTIC NOTE   Pharmacy has received consult(s) for Aztreonam/Vancomycin from an ED provider.   The patient's profile has been reviewed for ht/wt/allergies/indication/available labs.    One time order(s) placed for Vancomycin IV 1000mg  x 1 and Aztreonam 2g x 1  Further antibiotics/pharmacy consults should be ordered by admitting physician if indicated.                       Thank you,  Lu Duffel, PharmD, BCPS Clinical Pharmacist 08/11/2019 10:45 AM

## 2019-08-11 NOTE — ED Notes (Signed)
Patient able to stand at bedside and use urinal. Increased respiratory rate and work of breathing noted.

## 2019-08-11 NOTE — ED Provider Notes (Addendum)
[facility] Emergency Department Provider Note  Time seen: 10:00 AM  I have reviewed the triage vital signs and the nursing notes.   HISTORY  Chief Complaint Shortness of Breath   HPI Ryan Pittman is a 79 y.o. male with a past medical history of anemia, angina, CKD, gastric reflux, hypertension, hyperlipidemia who tested positive for Covid approximately 4 weeks ago presents to the emergency department for worsening shortness of breath and cough.  According to the patient had been feeling better, has oxygen concentrator to be used as needed at home however over the past 3 to 4 days he has been using it around the clock, EMS states upon their arrival they took the patient off oxygen he dropped around 60% before they placed him on a nonrebreather mask and brought him to the emergency department.  Patient states some chest discomfort mostly with coughing.  No leg pain.  No recent fevers per patient.   Past Medical History:  Diagnosis Date  . Anemia    due to GIB, s/p transfusion  . Anginal pain ([place])   . Arthritis    back pain, much worse after consecutive golf rounds  . Cancer ([place])    thyroid  . Chronic kidney disease   . Colon polyps   . Coronary artery disease   . COVID-19   . Dyspnea    walking up a hill  . GERD (gastroesophageal reflux disease)   . Hypercalcemia    h/o, resolved as of 2012, prev due to high amount of calcium intake  . Hyperlipidemia   . Hypertension   . IgG gammopathy    stable as of 2012 per [facility]  . Pneumonia    as a child  . PVD (peripheral vascular disease) (H[facility])    L leg bypass, R leg stented    Patient Active Problem List   Diagnosis Date Noted  . COVID-19 virus infection   . Acute renal failure superimposed on stage 3 chronic kidney disease ([place]) 07/09/2019  . Pneumonia due to COVID-19 virus 07/09/2019  . Normocytic anemia 07/09/2019  . Ankle pain 07/02/2019  . Viral syndrome 11/06/2018  . CAD (coronary artery  disease) 08/08/2018  . PAF (paroxysmal atrial fibrillation) ([place]) 08/06/2018  . Hx of CABG 07/11/2018  . Health care maintenance 04/04/2018  . Atypical chest pain 04/04/2018  . Hypothyroidism 03/30/2017  . Advance care planning 03/30/2017  . 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 Renal artery stenosis ([place]) 07/04/2016  . 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 Hyperglycemia 12/28/2015  . Hyperlipidemia 12/28/2015  . Thyroid cancer ([place]) 11/12/2014  . Shortness of breath 04/22/2014  . Spasm 04/22/2014  . Other cervical disc degeneration, unspecified cervical region 04/08/2014  . RLS (restless legs syndrome) 11/16/2013  . Insomnia 06/18/2013  . Medicare annual wellness visit, subsequent 03/12/2012  . HTN (hypertension) 08/14/2011  . BPH (benign prostatic hyperplasia) 04/28/2011  . CKD (chronic kidney disease) stage 3, GFR 30-59 ml/min 02/13/2011  . OA (osteoarthritis) 02/13/2011  . Carotid stenosis 02/13/2011  . ED (erectile dysfunction) 02/13/2011  . Duodenal ulcer 02/13/2011  . Diverticulosis 02/13/2011  . PVD (peripheral vascular disease) ([place]) 01/26/2011  . S/P bypass graft of extremity 01/26/2011  . Back pain 01/26/2011  . 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 Reduced libido 02/14/2010  . Nocturia 05/12/2008  . Urinary hesitancy 05/12/2008    Past Surgical History:  Procedure Laterality Date  .  dental implant left bottom-did have bleeding     . ABDOMINAL AORTOGRAM  W/LOWER EXTREMITY Bilateral 06/26/2019   Procedure: ABDOMINAL AORTOGRAM W/LOWER EXTREMITY;  Surgeon: [name], MD;  Location: [facility];  Service: Cardiovascular;  Laterality: Bilateral;  . BYPASS GRAFT     L leg  . COLONOSCOPY WITH PROPOFOL N/A 05/04/2015   Procedure: COLONOSCOPY WITH PROPOFOL;  Surgeon: Lollie Sails, MD;  Location: [facility] ENDOSCOPY;  Service: Endoscopy;  Laterality: N/A;  . CORONARY ARTERY BYPASS GRAFT N/A 07/11/2018   Procedure: CORONARY ARTERY BYPASS GRAFTING (CABG) x  three, using left internal mammary artery and right leg greater saphenous vein harvested endoscopically;  Surgeon: Gaye Pollack, MD;  Location: [facility];  Service: Open Heart Surgery;  Laterality: N/A;  . LEFT HEART CATH AND CORONARY ANGIOGRAPHY N/A 06/24/2018   Procedure: LEFT HEART CATH AND CORONARY ANGIOGRAPHY;  Surgeon: [name], MD;  Location: [facility];  Service: Cardiovascular;  Laterality: N/A;  . TEE WITHOUT CARDIOVERSION N/A 07/11/2018   Procedure: TRANSESOPHAGEAL ECHOCARDIOGRAM (TEE);  Surgeon: Gaye Pollack, MD;  Location: [facility];  Service: Open Heart Surgery;  Laterality: N/A;  . THYROIDECTOMY, PARTIAL  2016    Prior to Admission medications   Medication Sig Start Date End Date Taking? Authorizing Provider  acetaminophen (TYLENOL) 500 MG tablet Take 1,000 mg by mouth every 6 (six) hours as needed for mild pain.    [provider]  amiodarone (PACERONE) 200 MG tablet TAKE 1 TABLET BY MOUTH DAILY 02/24/19   [name], MD  amLODipine (NORVASC) 2.5 MG tablet TAKE ONE TABLET BY MOUTH EVERY DAY 04/25/19   Tonia Ghent, MD  benzonatate (TESSALON) 200 MG capsule Take 1 capsule (200 mg total) by mouth 3 (three) times daily as needed for cough. 07/07/19   Tonia Ghent, MD  Cholecalciferol (VITAMIN D) 50 MCG (2000 UT) tablet Take 2,000 Units by mouth daily.    [provider]  clopidogrel (PLAVIX) 75 MG tablet TAKE ONE TABLET EVERY DAY 10/04/18   Tonia Ghent, MD  Co-Enzyme Q-10 100 MG CAPS Take 100 mg by mouth daily.     [provider]  Evolocumab (REPATHA SURECLICK) XX123456 MG/ML SOAJ Inject 140 mg into the skin every 14 (fourteen) days. 02/17/19   [name], MD  ferrous sulfate 325 (65 FE) MG tablet Take 1 tablet (325 mg total) by mouth daily with breakfast. 08/07/19   Tonia Ghent, MD  levothyroxine (SYNTHROID, LEVOTHROID) 75 MCG tablet Take 75 mcg by mouth daily before breakfast. EXCEPT: Takes 112.5 mg  every Thursday per  pt    [provider]  metoprolol tartrate (LOPRESSOR) 25 MG tablet Take 0.5 tablets (12.5 mg total) by mouth 2 (two) times daily. 09/05/18   [name], MD  Multiple Vitamins-Minerals (MULTIVITAMIN WITH MINERALS) tablet Take 1 tablet by mouth daily. 07/19/19 07/18/20  Bonnielee Haff, MD  Omega-3 Fatty Acids (FISH OIL OMEGA-3 PO) Take 1,400 Units by mouth daily.     [provider]  pantoprazole (PROTONIX) 40 MG tablet Take 1 tablet (40 mg total) by mouth daily. 07/20/19 08/19/19  Bonnielee Haff, MD  polyethylene glycol (MIRALAX / GLYCOLAX) 17 g packet Take 17 g by mouth daily. 07/19/19   Bonnielee Haff, MD  pravastatin (PRAVACHOL) 40 MG tablet TAKE ONE TABLET BY MOUTH EVERY DAY Patient taking differently: Take 40 mg by mouth daily.  06/10/19   Tonia Ghent, MD  predniSONE (DELTASONE) 20 MG tablet Take 3 tablets once daily for 3 days followed by 2 tablets once  daily for 3 days followed by 1 tablet once daily for 3 days and then stop 07/19/19   Bonnielee Haff, MD  tamsulosin (FLOMAX) 0.4 MG CAPS capsule Take 1 capsule (0.4 mg total) by mouth daily. 11/20/18   Stoioff, Ronda Fairly, MD  traZODone (DESYREL) 100 MG tablet Take 50-100 mg by mouth at bedtime as needed for sleep.  06/30/19   [provider]  vitamin C (ASCORBIC ACID) 500 MG tablet Take 500 mg by mouth daily.    [provider]    Allergies  Allergen Reactions  . Penicillins Itching and Other (See Comments)    PATIENT HAS HAD A PCN REACTION WITH IMMEDIATE RASH, FACIAL/TONGUE/THROAT SWELLING, SOB, OR LIGHTHEADEDNESS WITH HYPOTENSION:  #  #  YES  #  #  Has patient had a PCN reaction causing severe rash involving mucus membranes or skin necrosis: No Has patient had a PCN reaction that required hospitalization: No Has patient had a PCN reaction occurring within the last 10 years: No If all of the above answers are "NO", then may proceed with Cephalosporin use.   . Ace Inhibitors Cough  . Aspirin  Other (See Comments)    H/o GI bleed  . Celebrex [Celecoxib] Other (See Comments)    GI bleed  . Lipitor [Atorvastatin] Other (See Comments)    myalgias    Family History  Problem Relation Age of Onset  . Diabetes Mother   . Stroke Father   . Colon cancer Neg Hx   . Prostate cancer Neg Hx     Social History Social History   Tobacco Use  . Smoking status: Former Smoker    Types: Cigarettes    Quit date: 09/04/2008    Years since quitting: 10.9  . Smokeless tobacco: Former Systems developer    Types: Chew  . Tobacco comment: HAs quit tobacco products 2009  Substance Use Topics  . Alcohol use: Not Currently    Alcohol/week: 0.0 standard drinks  . Drug use: No    Review of Systems Constitutional: Negative for fever. Cardiovascular: Mild chest pain Respiratory: Positive for shortness of breath.  Positive for cough. Gastrointestinal: Negative for abdominal pain Musculoskeletal: Negative for musculoskeletal complaints Neurological: Negative for headache All other ROS negative  ____________________________________________   PHYSICAL EXAM:  VITAL SIGNS: ED Triage Vitals  Enc Vitals Group     BP 08/11/19 0955 (!) 152/64     Pulse Rate 08/11/19 0955 86     Resp 08/11/19 0955 16     Temp --      Temp src --      SpO2 08/11/19 0954 100 %     Weight 08/11/19 0958 150 lb (68 kg)     Height 08/11/19 0958 5\' 7"  (1.702 m)     Head Circumference --      Peak Flow --      Pain Score 08/11/19 0958 0     Pain Loc --      Pain Edu? --      Excl. in [place]? --    Constitutional: Alert and oriented. Well appearing and in no distress. Eyes: Normal exam ENT      Head: Normocephalic and atraumatic.      Mouth/Throat: Mucous membranes are moist. Cardiovascular: Normal rate, regular rhythm.  Respiratory: Normal respiratory effort without tachypnea nor retractions. Breath sounds are clear  Gastrointestinal: Soft and nontender. No distention.  Musculoskeletal: Nontender with normal range of  motion in all extremities. No lower extremity tenderness Neurologic:  Normal  speech and language. No gross focal neurologic deficits Skin:  Skin is warm, dry and intact.  Psychiatric: Mood and affect are normal  ____________________________________________    EKG  EKG viewed and interpreted by myself shows a normal sinus rhythm at 79 bpm with a narrow QRS, normal axis, normal intervals, no concerning ST changes.  ____________________________________________    RADIOLOGY  X-ray and CTA consistent with multifocal pneumonia  ____________________________________________   INITIAL IMPRESSION / ASSESSMENT AND PLAN / ED COURSE  Pertinent labs & imaging results that were available during my care of the patient were reviewed by me and considered in my medical decision making (see chart for details).   Patient presents to the emergency department for shortness of breath with a room air saturation around 60% per EMS currently satting around 100% on a nonrebreather.  Patient is tachypneic around 20 to 25 breaths/min during my evaluation.  Is able to answer questions appropriately and follow commands.  Does describe mild chest pain but only with coughing.  No leg pain.  We will proceed with labs, chest x-ray and continue to closely monitor.  We will likely require CT scan possibly CTA of the chest once lab results are known.  Patient's rapid Covid is negative.  Patient white blood cell count is 14,000 x-ray consistent multifocal pneumonia.  CTA negative for PE but shows multifocal pneumonia.  We will cover with HCAP antibiotics patient will be admitted for further treatment.  Discussed the patient with [facility], since the patient's last positive is greater than 21 days any tested negative today they will not be excepting the patient to [facility].  I discussed with our intensivist who is accepted the patient to [facility] but does not have any hospital/ICU beds.  We called [facility], [facility] is  also out of ICU beds.  I then spoke to our intensivist once again and they will accept locally and will try to find a bed for the patient.  [name] was evaluated in Emergency Department on 08/11/2019 for the symptoms described in the history of present illness. He was evaluated in the context of the global COVID-19 pandemic, which necessitated consideration that the patient might be at risk for infection with the SARS-CoV-2 virus that causes COVID-19. Institutional protocols and algorithms that pertain to the evaluation of patients at risk for COVID-19 are in a state of rapid change based on information released by regulatory bodies including the CDC and federal and state organizations. These policies and algorithms were followed during the patient's care in the ED.  CRITICAL CARE Performed by: Harvest Dark   Total critical care time: 45 minutes  Critical care time was exclusive of separately billable procedures and treating other patients.  Critical care was necessary to treat or prevent imminent or life-threatening deterioration.  Critical care was time spent personally by me on the following activities: development of treatment plan with patient and/or surrogate as well as nursing, discussions with consultants, evaluation of patient's response to treatment, examination of patient, obtaining history from patient or surrogate, ordering and performing treatments and interventions, ordering and review of laboratory studies, ordering and review of radiographic studies, pulse oximetry and re-evaluation of patient's condition.   ____________________________________________   FINAL CLINICAL IMPRESSION(S) / ED DIAGNOSES  Dyspnea Hypoxia Multifocal pneumonia   Harvest Dark, MD 08/11/19 1336    Harvest Dark, MD 08/11/19 1521

## 2019-08-12 ENCOUNTER — Inpatient Hospital Stay (HOSPITAL_COMMUNITY)
Admit: 2019-08-12 | Discharge: 2019-08-12 | Disposition: A | Discharge status: Home / Self Care | Payer: Medicare Other | Attending: Critical Care Medicine | Admitting: Critical Care Medicine

## 2019-08-12 DIAGNOSIS — I340 Nonrheumatic mitral (valve) insufficiency: Secondary | ICD-10-CM

## 2019-08-12 DIAGNOSIS — J9602 Acute respiratory failure with hypercapnia: Secondary | ICD-10-CM

## 2019-08-12 DIAGNOSIS — I2510 Atherosclerotic heart disease of native coronary artery without angina pectoris: Secondary | ICD-10-CM

## 2019-08-12 LAB — CBC WITH DIFFERENTIAL/PLATELET
Abs Immature Granulocytes: 0.56 10*3/uL — ABNORMAL HIGH (ref 0.00–0.07)
Basophils Absolute: 0 10*3/uL (ref 0.0–0.1)
Basophils Relative: 0 %
Eosinophils Absolute: 0 10*3/uL (ref 0.0–0.5)
Eosinophils Relative: 0 %
HCT: 22.6 % — ABNORMAL LOW (ref 39.0–52.0)
Hemoglobin: 7 g/dL — ABNORMAL LOW (ref 13.0–17.0)
Immature Granulocytes: 4 %
Lymphocytes Relative: 5 %
Lymphs Abs: 0.7 10*3/uL (ref 0.7–4.0)
MCH: 28.1 pg (ref 26.0–34.0)
MCHC: 31 g/dL (ref 30.0–36.0)
MCV: 90.8 fL (ref 80.0–100.0)
Monocytes Absolute: 0.4 10*3/uL (ref 0.1–1.0)
Monocytes Relative: 3 %
Neutro Abs: 12.1 10*3/uL — ABNORMAL HIGH (ref 1.7–7.7)
Neutrophils Relative %: 88 %
Platelets: 465 10*3/uL — ABNORMAL HIGH (ref 150–400)
RBC: 2.49 MIL/uL — ABNORMAL LOW (ref 4.22–5.81)
RDW: 17.5 % — ABNORMAL HIGH (ref 11.5–15.5)
WBC: 13.8 10*3/uL — ABNORMAL HIGH (ref 4.0–10.5)
nRBC: 0 % (ref 0.0–0.2)

## 2019-08-12 LAB — TROPONIN I (HIGH SENSITIVITY): Troponin I (High Sensitivity): 13 ng/L (ref <18)

## 2019-08-12 LAB — COMPREHENSIVE METABOLIC PANEL
ALT: 12 U/L (ref 0–44)
AST: 20 U/L (ref 15–41)
Albumin: 1.9 g/dL — ABNORMAL LOW (ref 3.5–5.0)
Alkaline Phosphatase: 53 U/L (ref 38–126)
Anion gap: 12 (ref 5–15)
BUN: 26 mg/dL — ABNORMAL HIGH (ref 8–23)
CO2: 19 mmol/L — ABNORMAL LOW (ref 22–32)
Calcium: 8 mg/dL — ABNORMAL LOW (ref 8.9–10.3)
Chloride: 110 mmol/L (ref 98–111)
Creatinine, Ser: 1.04 mg/dL (ref 0.61–1.24)
GFR calc Af Amer: >60 mL/min (ref >60)
GFR calc non Af Amer: >60 mL/min (ref >60)
Glucose, Bld: 211 mg/dL — ABNORMAL HIGH (ref 70–99)
Potassium: 4.1 mmol/L (ref 3.5–5.1)
Sodium: 141 mmol/L (ref 135–145)
Total Bilirubin: 0.6 mg/dL (ref 0.3–1.2)
Total Protein: 5.5 g/dL — ABNORMAL LOW (ref 6.5–8.1)

## 2019-08-12 LAB — GLUCOSE, CAPILLARY
Glucose-Capillary: 261 mg/dL — ABNORMAL HIGH (ref 70–99)
Glucose-Capillary: 179 mg/dL — ABNORMAL HIGH (ref 70–99)
Glucose-Capillary: 283 mg/dL — ABNORMAL HIGH (ref 70–99)
Glucose-Capillary: 225 mg/dL — ABNORMAL HIGH (ref 70–99)
Glucose-Capillary: 148 mg/dL — ABNORMAL HIGH (ref 70–99)

## 2019-08-12 LAB — FERRITIN: Ferritin: 223 ng/mL (ref 24–336)

## 2019-08-12 LAB — MRSA PCR SCREENING: MRSA by PCR: POSITIVE — AB

## 2019-08-12 LAB — ECHOCARDIOGRAM COMPLETE
Height: 67 in
Weight: 2400 oz

## 2019-08-12 LAB — FIBRIN DERIVATIVES D-DIMER (ARMC ONLY): Fibrin derivatives D-dimer (ARMC): 794.72 ng/mL (FEU) — ABNORMAL HIGH (ref 0.00–499.00)

## 2019-08-12 LAB — C-REACTIVE PROTEIN: CRP: 29.3 mg/dL — ABNORMAL HIGH (ref <1.0)

## 2019-08-12 LAB — ABO/RH: ABO/RH(D): A NEG

## 2019-08-12 LAB — TSH: TSH: 1.814 u[IU]/mL (ref 0.350–4.500)

## 2019-08-12 LAB — MAGNESIUM: Magnesium: 2.1 mg/dL (ref 1.7–2.4)

## 2019-08-12 MED ORDER — LEVOTHYROXINE SODIUM 25 MCG PO TABS
112.5000 ug | ORAL_TABLET | ORAL | Status: DC
  Administered 2019-08-14 – 2019-08-28 (×3): 112.5 ug via ORAL
  Filled 2019-08-12: qty 1
  Filled 2019-08-12 (×2): qty 1.5
  Filled 2019-08-12 (×3): qty 1

## 2019-08-12 MED ORDER — INSULIN ASPART 100 UNIT/ML ~~LOC~~ SOLN
0.0000 [IU] | Freq: Three times a day (TID) | SUBCUTANEOUS | Status: DC
  Administered 2019-08-12: 3 [IU] via SUBCUTANEOUS
  Administered 2019-08-12: 8 [IU] via SUBCUTANEOUS
  Administered 2019-08-12: 5 [IU] via SUBCUTANEOUS
  Administered 2019-08-13: 3 [IU] via SUBCUTANEOUS
  Administered 2019-08-13: 5 [IU] via SUBCUTANEOUS
  Administered 2019-08-13 – 2019-08-14 (×2): 3 [IU] via SUBCUTANEOUS
  Administered 2019-08-14: 2 [IU] via SUBCUTANEOUS
  Administered 2019-08-14: 8 [IU] via SUBCUTANEOUS
  Administered 2019-08-15: 3 [IU] via SUBCUTANEOUS
  Administered 2019-08-15 – 2019-08-16 (×2): 5 [IU] via SUBCUTANEOUS
  Administered 2019-08-17: 3 [IU] via SUBCUTANEOUS
  Administered 2019-08-18: 2 [IU] via SUBCUTANEOUS
  Administered 2019-08-19 (×2): 5 [IU] via SUBCUTANEOUS
  Administered 2019-08-20: 3 [IU] via SUBCUTANEOUS
  Administered 2019-08-20: 2 [IU] via SUBCUTANEOUS
  Administered 2019-08-21: 5 [IU] via SUBCUTANEOUS
  Administered 2019-08-21: 2 [IU] via SUBCUTANEOUS
  Administered 2019-08-22: 8 [IU] via SUBCUTANEOUS
  Administered 2019-08-23: 2 [IU] via SUBCUTANEOUS
  Administered 2019-08-23: 3 [IU] via SUBCUTANEOUS
  Administered 2019-08-24 (×2): 2 [IU] via SUBCUTANEOUS
  Administered 2019-08-24 – 2019-08-25 (×2): 3 [IU] via SUBCUTANEOUS
  Administered 2019-08-26: 5 [IU] via SUBCUTANEOUS
  Administered 2019-08-26: 2 [IU] via SUBCUTANEOUS
  Filled 2019-08-12 (×28): qty 1

## 2019-08-12 MED ORDER — GUAIFENESIN 100 MG/5ML PO SOLN
5.0000 mL | Freq: Once | ORAL | Status: AC
  Administered 2019-08-12: 100 mg via ORAL
  Filled 2019-08-12: qty 5

## 2019-08-12 MED ORDER — CHLORHEXIDINE GLUCONATE CLOTH 2 % EX PADS
6.0000 | MEDICATED_PAD | Freq: Every day | CUTANEOUS | Status: DC
  Administered 2019-08-13: 6 via TOPICAL

## 2019-08-12 MED ORDER — HYDROCOD POLST-CPM POLST ER 10-8 MG/5ML PO SUER
5.0000 mL | Freq: Two times a day (BID) | ORAL | Status: DC | PRN
  Administered 2019-08-12 – 2019-08-25 (×5): 5 mL via ORAL
  Filled 2019-08-12 (×5): qty 5

## 2019-08-12 MED ORDER — SODIUM CHLORIDE 0.9 % IV SOLN
510.0000 mg | Freq: Once | INTRAVENOUS | Status: AC
  Administered 2019-08-13: 510 mg via INTRAVENOUS
  Filled 2019-08-12: qty 17

## 2019-08-12 MED ORDER — METHYLPREDNISOLONE SODIUM SUCC 125 MG IJ SOLR
80.0000 mg | Freq: Two times a day (BID) | INTRAMUSCULAR | Status: DC
  Administered 2019-08-12: 80 mg via INTRAVENOUS
  Administered 2019-08-13: 62.5 mg via INTRAVENOUS
  Filled 2019-08-12 (×2): qty 2

## 2019-08-12 MED ORDER — INSULIN ASPART 100 UNIT/ML ~~LOC~~ SOLN
0.0000 [IU] | Freq: Every day | SUBCUTANEOUS | Status: DC
  Administered 2019-08-15 – 2019-08-16 (×2): 2 [IU] via SUBCUTANEOUS
  Filled 2019-08-12 (×3): qty 1

## 2019-08-12 NOTE — Care Plan (Signed)
After further assessment and discussion of patient's  status and medical condition during multidisciplinary rounds, the plan is outlined as below:   Continue to titrate FiO2 for sats of 88 to 92%  Decrease methylprednisolone to 80 mg IV twice a day  Continue to withhold amiodarone   Above discussed with Dr. Manuella Ghazi.   Renold Don, MD [place] [facility]

## 2019-08-12 NOTE — Progress Notes (Signed)
PHARMACIST - PHYSICIAN ORDER COMMUNICATION  CONCERNING: P&T Medication Policy on Herbal Medications  DESCRIPTION:  This patient's order for:  CO ENZYME Q-10  has been noted.  This product(s) is classified as an "herbal" or natural product. Due to a lack of definitive safety studies or FDA approval, nonstandard manufacturing practices, plus the potential risk of unknown drug-drug interactions while on inpatient medications, the Pharmacy and Therapeutics Committee does not permit the use of "herbal" or natural products of this type within [facility].   ACTION TAKEN: The pharmacy department is unable to verify this order at this time.   Please reevaluate patient's clinical condition at discharge and address if the herbal or natural product(s) should be resumed at that time.

## 2019-08-12 NOTE — Progress Notes (Signed)
Transported pt to ICU 2. Placed pt back on HFNC. Pt tol well at this time. Report given to ICU RT.

## 2019-08-12 NOTE — Telephone Encounter (Signed)
Noted. Thanks.  I will await the inpatient notes. App help of all involved.

## 2019-08-12 NOTE — Progress Notes (Signed)
*  PRELIMINARY RESULTS* Echocardiogram 2D Echocardiogram has been performed.  Ryan Pittman 08/12/2019, 3:00 PM

## 2019-08-12 NOTE — Progress Notes (Signed)
1         at [facility] NAME: Ryan Pittman    MR#:  [id number]  DATE OF BIRTH:  11-Feb-1941  SUBJECTIVE:  CHIEF COMPLAINT:   Chief Complaint  Patient presents with   Shortness of Breath  Short of breath, hemoglobin 7.0 REVIEW OF SYSTEMS:  Review of Systems  Constitutional: Positive for malaise/fatigue. Negative for diaphoresis, fever and weight loss.  HENT: Negative for ear discharge, ear pain, hearing loss, nosebleeds, sore throat and tinnitus.   Eyes: Negative for blurred vision and pain.  Respiratory: Positive for shortness of breath. Negative for cough, hemoptysis and wheezing.   Cardiovascular: Negative for chest pain, palpitations, orthopnea and leg swelling.  Gastrointestinal: Negative for abdominal pain, blood in stool, constipation, diarrhea, heartburn, nausea and vomiting.  Genitourinary: Negative for dysuria, frequency and urgency.  Musculoskeletal: Negative for back pain and myalgias.  Skin: Negative for itching and rash.  Neurological: Negative for dizziness, tingling, tremors, focal weakness, seizures, weakness and headaches.  Psychiatric/Behavioral: Negative for depression. The patient is not nervous/anxious.     DRUG ALLERGIES:   Allergies  Allergen Reactions   Penicillins Itching and Other (See Comments)    PATIENT HAS HAD A PCN REACTION WITH IMMEDIATE RASH, FACIAL/TONGUE/THROAT SWELLING, SOB, OR LIGHTHEADEDNESS WITH HYPOTENSION:  #  #  YES  #  #  Has patient had a PCN reaction causing severe rash involving mucus membranes or skin necrosis: No Has patient had a PCN reaction that required hospitalization: No Has patient had a PCN reaction occurring within the last 10 years: No If all of the above answers are "NO", then may proceed with Cephalosporin use.    Ace Inhibitors Cough   Aspirin Other (See Comments)    H/o GI bleed   Celebrex [Celecoxib] Other (See Comments)    GI bleed   Lipitor [Atorvastatin] Other (See  Comments)    myalgias   VITALS:  Blood pressure 112/64, pulse 62, temperature (!) 97.4 F (36.3 C), resp. rate 20, height 5\' 7"  (1.702 m), weight 68 kg, SpO2 94 %. PHYSICAL EXAMINATION:  Physical Exam HENT:     Head: Normocephalic and atraumatic.  Eyes:     Conjunctiva/sclera: Conjunctivae normal.     Pupils: Pupils are equal, round, and reactive to light.  Neck:     Musculoskeletal: Normal range of motion and neck supple.     Thyroid: No thyromegaly.     Trachea: No tracheal deviation.  Cardiovascular:     Rate and Rhythm: Normal rate and regular rhythm.     Heart sounds: Normal heart sounds.  Pulmonary:     Effort: Pulmonary effort is normal. No respiratory distress.     Breath sounds: Normal breath sounds. No wheezing.  Chest:     Chest wall: No tenderness.  Abdominal:     General: Bowel sounds are normal. There is no distension.     Palpations: Abdomen is soft.     Tenderness: There is no abdominal tenderness.  Musculoskeletal: Normal range of motion.  Skin:    General: Skin is warm and dry.     Findings: No rash.  Neurological:     Mental Status: He is alert and oriented to person, place, and time.     Cranial Nerves: No cranial nerve deficit.    LABORATORY PANEL:  Male CBC Recent Labs  Lab 08/12/19 0551  WBC 13.8*  HGB 7.0*  HCT 22.6*  PLT 465*   ------------------------------------------------------------------------------------------------------------------ Chemistries  Recent Labs  Lab 08/12/19 0551  NA 141  K 4.1  CL 110  CO2 19*  GLUCOSE 211*  BUN 26*  CREATININE 1.04  CALCIUM 8.0*  MG 2.1  AST 20  ALT 12  ALKPHOS 53  BILITOT 0.6   RADIOLOGY:  No results found. ASSESSMENT AND PLAN:   Acute respiratory failure with hypoxia due to multifocal pneumonia and recent acute respiratory disease due to COVID-19 virus:  Requiring high flow nasal cannula, 65% FiO2 - Continue to titrate FiO2 for sats of 88 to 92% -Continue solumedrol 80 mg bid  and Abx as ordered - vanco and aztreonam -bronchodilators -PRN Mucinex for cough -Negative blood culture -Daily CRP, Ferritin, D-dimer, -maintain an awake prone position for 16+ hours a day, if possible, with a minimum of 2-3 hours at a time -Appreciate PCCM input -concern for amiodarone toxicity based on the CT findings, holding amiodarone  CKD (chronic kidney disease) stage 3a, GFR 30-59 ml/min: stable.   At baseline  HTN (hypertension): -continue home meds.  Hyperlipidemia: -statin  Hypothyroidism: -synthroid, check TSH  PAF (paroxysmal atrial fibrillation) ([place]): -metoprolol -Hold amiodarone as per discussion with PCCM this could be amiodarone toxicity  Hx of CAD (coronary artery disease): -on plavix and statin  Iron deficiency anemia: Hgb 9.0 -->7.0 -We will transfuse 1 packed red blood cell if hemoglobin drops less than 7, no obvious bleeding -Iron level 17 -continue iron supplement, will order 1 dose of IV iron for tomorrow     All the records are reviewed and case discussed with Care Management/Social Worker. Management plans discussed with the patient, nursing and they are in agreement.  CODE STATUS: Full Code  TOTAL TIME TAKING CARE OF THIS PATIENT: 25 minutes.   More than 50% of the time was spent in counseling/coordination of care: YES  POSSIBLE D/C IN 3-4 DAYS, DEPENDING ON CLINICAL CONDITION.   Max Sane M.D on 08/12/2019 at 5:06 PM  Between 7am to 6pm - Pager - [phone]  After 6pm go to [email or web address] - password [email or web address]  [facility]   CC: Primary care physician; Tonia Ghent, MD  Note: This dictation was prepared with Dragon dictation along with smaller phrase technology. Any transcriptional errors that result from this process are unintentional.

## 2019-08-13 ENCOUNTER — Inpatient Hospital Stay: Payer: Medicare Other

## 2019-08-13 LAB — CBC WITH DIFFERENTIAL/PLATELET
Abs Immature Granulocytes: 1.18 10*3/uL — ABNORMAL HIGH (ref 0.00–0.07)
Basophils Absolute: 0 10*3/uL (ref 0.0–0.1)
Basophils Relative: 0 %
Eosinophils Absolute: 0 10*3/uL (ref 0.0–0.5)
Eosinophils Relative: 0 %
HCT: 19.9 % — ABNORMAL LOW (ref 39.0–52.0)
Hemoglobin: 6.7 g/dL — ABNORMAL LOW (ref 13.0–17.0)
Immature Granulocytes: 5 %
Lymphocytes Relative: 4 %
Lymphs Abs: 1 10*3/uL (ref 0.7–4.0)
MCH: 28.9 pg (ref 26.0–34.0)
MCHC: 33.7 g/dL (ref 30.0–36.0)
MCV: 85.8 fL (ref 80.0–100.0)
Monocytes Absolute: 0.8 10*3/uL (ref 0.1–1.0)
Monocytes Relative: 3 %
Neutro Abs: 22.3 10*3/uL — ABNORMAL HIGH (ref 1.7–7.7)
Neutrophils Relative %: 88 %
Platelets: 482 10*3/uL — ABNORMAL HIGH (ref 150–400)
RBC: 2.32 MIL/uL — ABNORMAL LOW (ref 4.22–5.81)
RDW: 17.7 % — ABNORMAL HIGH (ref 11.5–15.5)
WBC: 25.3 10*3/uL — ABNORMAL HIGH (ref 4.0–10.5)
nRBC: 0 % (ref 0.0–0.2)

## 2019-08-13 LAB — HEMOGLOBIN AND HEMATOCRIT, BLOOD
HCT: 28.1 % — ABNORMAL LOW (ref 39.0–52.0)
Hemoglobin: 9.3 g/dL — ABNORMAL LOW (ref 13.0–17.0)

## 2019-08-13 LAB — GLUCOSE, CAPILLARY
Glucose-Capillary: 159 mg/dL — ABNORMAL HIGH (ref 70–99)
Glucose-Capillary: 169 mg/dL — ABNORMAL HIGH (ref 70–99)
Glucose-Capillary: 212 mg/dL — ABNORMAL HIGH (ref 70–99)
Glucose-Capillary: 206 mg/dL — ABNORMAL HIGH (ref 70–99)
Glucose-Capillary: 140 mg/dL — ABNORMAL HIGH (ref 70–99)

## 2019-08-13 LAB — COMPREHENSIVE METABOLIC PANEL
ALT: 12 U/L (ref 0–44)
AST: 17 U/L (ref 15–41)
Albumin: 1.7 g/dL — ABNORMAL LOW (ref 3.5–5.0)
Alkaline Phosphatase: 51 U/L (ref 38–126)
Anion gap: 9 (ref 5–15)
BUN: 40 mg/dL — ABNORMAL HIGH (ref 8–23)
CO2: 21 mmol/L — ABNORMAL LOW (ref 22–32)
Calcium: 7.9 mg/dL — ABNORMAL LOW (ref 8.9–10.3)
Chloride: 109 mmol/L (ref 98–111)
Creatinine, Ser: 1.08 mg/dL (ref 0.61–1.24)
GFR calc Af Amer: >60 mL/min (ref >60)
GFR calc non Af Amer: >60 mL/min (ref >60)
Glucose, Bld: 174 mg/dL — ABNORMAL HIGH (ref 70–99)
Potassium: 4.2 mmol/L (ref 3.5–5.1)
Sodium: 139 mmol/L (ref 135–145)
Total Bilirubin: 0.4 mg/dL (ref 0.3–1.2)
Total Protein: 5.1 g/dL — ABNORMAL LOW (ref 6.5–8.1)

## 2019-08-13 LAB — FERRITIN: Ferritin: 262 ng/mL (ref 24–336)

## 2019-08-13 LAB — C-REACTIVE PROTEIN: CRP: 17.4 mg/dL — ABNORMAL HIGH (ref <1.0)

## 2019-08-13 LAB — PREPARE RBC (CROSSMATCH)

## 2019-08-13 LAB — MAGNESIUM: Magnesium: 2.2 mg/dL (ref 1.7–2.4)

## 2019-08-13 LAB — FIBRIN DERIVATIVES D-DIMER (ARMC ONLY): Fibrin derivatives D-dimer (ARMC): 531.55 ng/mL (FEU) — ABNORMAL HIGH (ref 0.00–499.00)

## 2019-08-13 LAB — PROCALCITONIN: Procalcitonin: <0.1 ng/mL

## 2019-08-13 MED ORDER — IPRATROPIUM-ALBUTEROL 20-100 MCG/ACT IN AERS
1.0000 | INHALATION_SPRAY | Freq: Four times a day (QID) | RESPIRATORY_TRACT | Status: DC
  Administered 2019-08-13 – 2019-08-14 (×4): 1 via RESPIRATORY_TRACT
  Filled 2019-08-13: qty 4

## 2019-08-13 MED ORDER — CHLORHEXIDINE GLUCONATE CLOTH 2 % EX PADS
6.0000 | MEDICATED_PAD | Freq: Every day | CUTANEOUS | Status: AC
  Administered 2019-08-14 – 2019-08-18 (×5): 6 via TOPICAL

## 2019-08-13 MED ORDER — LEVOFLOXACIN IN D5W 500 MG/100ML IV SOLN
500.0000 mg | INTRAVENOUS | Status: DC
  Administered 2019-08-13: 500 mg via INTRAVENOUS
  Filled 2019-08-13 (×2): qty 100

## 2019-08-13 MED ORDER — METHYLPREDNISOLONE SODIUM SUCC 40 MG IJ SOLR
40.0000 mg | Freq: Two times a day (BID) | INTRAMUSCULAR | Status: DC
  Administered 2019-08-13 – 2019-08-14 (×2): 40 mg via INTRAVENOUS
  Filled 2019-08-13 (×2): qty 1

## 2019-08-13 MED ORDER — MUPIROCIN 2 % EX OINT
1.0000 "application " | TOPICAL_OINTMENT | Freq: Two times a day (BID) | CUTANEOUS | Status: AC
  Administered 2019-08-13 – 2019-08-18 (×10): 1 via NASAL
  Filled 2019-08-13: qty 22

## 2019-08-13 MED ORDER — PANTOPRAZOLE SODIUM 40 MG IV SOLR
40.0000 mg | Freq: Two times a day (BID) | INTRAVENOUS | Status: DC
  Administered 2019-08-13 – 2019-08-15 (×5): 40 mg via INTRAVENOUS
  Filled 2019-08-13 (×5): qty 40

## 2019-08-13 MED ORDER — SODIUM CHLORIDE 0.9% IV SOLUTION
Freq: Once | INTRAVENOUS | Status: AC
  Administered 2019-08-13: 14:00:00 via INTRAVENOUS

## 2019-08-13 MED ORDER — FUROSEMIDE 10 MG/ML IJ SOLN
40.0000 mg | Freq: Once | INTRAMUSCULAR | Status: AC
  Administered 2019-08-13: 40 mg via INTRAVENOUS
  Filled 2019-08-13: qty 4

## 2019-08-13 NOTE — Progress Notes (Signed)
1        [facility] [place] NAME: Ryan Pittman    MR#:  [id number]  DATE OF BIRTH:  Apr 29, 1941  SUBJECTIVE:  CHIEF COMPLAINT:   Chief Complaint  Patient presents with  . Shortness of Breath  Short of breath, hemoglobin 6.7, wife at bedside REVIEW OF SYSTEMS:  Review of Systems  Constitutional: Positive for malaise/fatigue. Negative for diaphoresis, fever and weight loss.  HENT: Negative for ear discharge, ear pain, hearing loss, nosebleeds, sore throat and tinnitus.   Eyes: Negative for blurred vision and pain.  Respiratory: Positive for shortness of breath. Negative for cough, hemoptysis and wheezing.   Cardiovascular: Negative for chest pain, palpitations, orthopnea and leg swelling.  Gastrointestinal: Negative for abdominal pain, blood in stool, constipation, diarrhea, heartburn, nausea and vomiting.  Genitourinary: Negative for dysuria, frequency and urgency.  Musculoskeletal: Negative for back pain and myalgias.  Skin: Negative for itching and rash.  Neurological: Negative for dizziness, tingling, tremors, focal weakness, seizures, weakness and headaches.  Psychiatric/Behavioral: Negative for depression. The patient is not nervous/anxious.    DRUG ALLERGIES:   Allergies  Allergen Reactions  . Penicillins Itching and Other (See Comments)    PATIENT HAS HAD A PCN REACTION WITH IMMEDIATE RASH, FACIAL/TONGUE/THROAT SWELLING, SOB, OR LIGHTHEADEDNESS WITH HYPOTENSION:  #  #  YES  #  #  Has patient had a PCN reaction causing severe rash involving mucus membranes or skin necrosis: No Has patient had a PCN reaction that required hospitalization: No Has patient had a PCN reaction occurring within the last 10 years: No If all of the above answers are "NO", then may proceed with Cephalosporin use.   . Ace Inhibitors Cough  . Aspirin Other (See Comments)    H/o GI bleed  . Celebrex [Celecoxib] Other (See Comments)    GI bleed  . Lipitor [Atorvastatin]  Other (See Comments)    myalgias   VITALS:  Blood pressure 97/60, pulse 66, temperature 97.8 F (36.6 C), temperature source Oral, resp. rate (!) 29, height 5\' 7"  (1.702 m), weight 68 kg, SpO2 (!) 85 %. PHYSICAL EXAMINATION:  Physical Exam HENT:     Head: Normocephalic and atraumatic.  Eyes:     Conjunctiva/sclera: Conjunctivae normal.     Pupils: Pupils are equal, round, and reactive to light.  Neck:     Musculoskeletal: Normal range of motion and neck supple.     Thyroid: No thyromegaly.     Trachea: No tracheal deviation.  Cardiovascular:     Rate and Rhythm: Normal rate and regular rhythm.     Heart sounds: Normal heart sounds.  Pulmonary:     Effort: Pulmonary effort is normal. No respiratory distress.     Breath sounds: Normal breath sounds. No wheezing.  Chest:     Chest wall: No tenderness.  Abdominal:     General: Bowel sounds are normal. There is no distension.     Palpations: Abdomen is soft.     Tenderness: There is no abdominal tenderness.  Musculoskeletal: Normal range of motion.  Skin:    General: Skin is warm and dry.     Findings: No rash.  Neurological:     Mental Status: He is alert and oriented to person, place, and time.     Cranial Nerves: No cranial nerve deficit.    LABORATORY PANEL:  Male CBC Recent Labs  Lab 08/13/19 0414  WBC 25.3*  HGB 6.7*  HCT 19.9*  PLT 482*   ------------------------------------------------------------------------------------------------------------------  Chemistries  Recent Labs  Lab 08/13/19 0414  NA 139  K 4.2  CL 109  CO2 21*  GLUCOSE 174*  BUN 40*  CREATININE 1.08  CALCIUM 7.9*  MG 2.2  AST 17  ALT 12  ALKPHOS 51  BILITOT 0.4   RADIOLOGY:  Dg Chest Port 1 View  Result Date: 08/13/2019 CLINICAL DATA:  Respiratory failure EXAM: PORTABLE CHEST 1 VIEW COMPARISON:  08/11/2019 FINDINGS: Post sternotomy and CABG with stable cardiomediastinal contours. Diffuse interstitial and airspace opacification  bilaterally with areas of more confluent airspace process at the lung bases with similar appearance. No sign of acute bone finding. IMPRESSION: No significant change since prior radiograph, findings remain suspicious for diffuse atypical infection. Superimposed edema also possible. Electronically Signed   By: Zetta Bills M.D.   On: 08/13/2019 09:14   ASSESSMENT AND PLAN:   Acute respiratory failure with hypoxia due to multifocal pneumonia and recent acute respiratory disease due to COVID-19 virus:  Requiring high flow nasal cannula, - Continue to titrate FiO2 for sats of 88 to 92% -Taper down Solu-Medrol to 40 mg IV twice daily.  Change antibiotics to Levaquin aztreonam.  Continue vancomycin -bronchodilators -PRN Mucinex for cough -Negative blood culture -Daily CRP, Ferritin, D-dimer, -maintain an awake prone position for 16+ hours a day, if possible, with a minimum of 2-3 hours at a time -Appreciate PCCM input -concern for amiodarone toxicity based on the CT findings, holding amiodarone for now  CKD (chronic kidney disease) stage 3a, GFR 30-59 ml/min: stable.   At baseline  HTN (hypertension): -continue home meds.  Hyperlipidemia: -statin  Hypothyroidism: -synthroid, TSH in normal limits  PAF (paroxysmal atrial fibrillation) ([place]): -metoprolol -Hold amiodarone as per discussion with PCCM this could be amiodarone toxicity  Hx of CAD (coronary artery disease): -on plavix and statin  Iron deficiency anemia/acute on chronic anemia: Hgb 9.0 -->6.7 -1 packed red blood cell transfusion today -Iron level 17 -Status post IV iron      All the records are reviewed and case discussed with Care Management/Social Worker. Management plans discussed with the patient, nursing, wife at bedside and they are in agreement.  CODE STATUS: Full Code  TOTAL TIME TAKING CARE OF THIS PATIENT: 25 minutes.   More than 50% of the time was spent in counseling/coordination of care: YES   POSSIBLE D/C IN 3-4 DAYS, DEPENDING ON CLINICAL CONDITION.   Max Sane M.D on 08/13/2019 at 3:58 PM  Between 7am to 6pm - Pager - [phone]  After 6pm go to [email or web address] - password [email or web address]  [facility]   CC: Primary care physician; Tonia Ghent, MD  Note: This dictation was prepared with Dragon dictation along with smaller phrase technology. Any transcriptional errors that result from this process are unintentional.

## 2019-08-13 NOTE — Progress Notes (Signed)
Pharmacy Antibiotic Note  Ryan Pittman is a 78 y.o. male admitted on 08/11/2019 with increasing SOB at rest. COVID positive approximately 4 weeks ago, treated with remdesivir 11/5 - 11/9. Pharmacy has been consulted for vancomycin dosing for pneumonia. This is day #3 of IV antibiotics, leukocytosis has worsened, CRP trending down but still elevated, PCT now negative. Aztreonam was changed to levofloxacin this morning  Plan: Continue vancomycin 1250 mg IV Q 24 hrs  Goal AUC 400-550 Expected AUC: 523 SCr used: 1.08   Height: 5\' 7"  (170.2 cm) Weight: 150 lb (68 kg) IBW/kg (Calculated) : 66.1  Temp (24hrs), Avg:97.8 F (36.6 C), Min:97.1 F (36.2 C), Max:98.4 F (36.9 C)  Recent Labs  Lab 08/11/19 1003 08/11/19 1004 08/11/19 1143 08/12/19 0551 08/13/19 0414  WBC  --  14.8*  --  13.8* 25.3*  CREATININE  --  1.03  --  1.04 1.08  LATICACIDVEN 1.1  --  1.1  --   --     Estimated Creatinine Clearance: 51 mL/min (by C-G formula based on SCr of 1.08 mg/dL).    Allergies  Allergen Reactions  . Penicillins Itching and Other (See Comments)    PATIENT HAS HAD A PCN REACTION WITH IMMEDIATE RASH, FACIAL/TONGUE/THROAT SWELLING, SOB, OR LIGHTHEADEDNESS WITH HYPOTENSION:  #  #  YES  #  #  Has patient had a PCN reaction causing severe rash involving mucus membranes or skin necrosis: No Has patient had a PCN reaction that required hospitalization: No Has patient had a PCN reaction occurring within the last 10 years: No If all of the above answers are "NO", then may proceed with Cephalosporin use.   . Ace Inhibitors Cough  . Aspirin Other (See Comments)    H/o GI bleed  . Celebrex [Celecoxib] Other (See Comments)    GI bleed  . Lipitor [Atorvastatin] Other (See Comments)    myalgias    Antimicrobials this admission: Aztreonam 12/7 >>12/9 Vancomycin 12/7 >> Levofloxacin 12/9 >>  Microbiology results: 12/7 BCx: NGTD 12/7 MRSA PCR: positive 12/7 COVID19: negative (recently treated  with remdesivir 11/5-11/9)  Thank you for allowing pharmacy to be a part of this patient's care.  Dallie Piles, PharmD 08/13/2019 9:15 AM

## 2019-08-13 NOTE — Care Plan (Signed)
After further assessment and discussion of patient's  status and medical condition during multidisciplinary rounds, the plan is outlined as below:   Continue to titrate FiO2 for sats of 88 to 92%  Decrease methylprednisolone to 40 mg IV twice a day  Continue to withhold amiodarone  Do Fungitell assay  Change antibiotics to Levaquin (aztreonam can worsen anemia)  Transfuse 1 unit PRBCs   Above discussed with Dr. Manuella Ghazi.   Renold Don, MD [facility]

## 2019-08-14 LAB — CBC WITH DIFFERENTIAL/PLATELET
Abs Immature Granulocytes: 2.23 10*3/uL — ABNORMAL HIGH (ref 0.00–0.07)
Basophils Absolute: 0.1 10*3/uL (ref 0.0–0.1)
Basophils Relative: 0 %
Eosinophils Absolute: 0 10*3/uL (ref 0.0–0.5)
Eosinophils Relative: 0 %
HCT: 25.5 % — ABNORMAL LOW (ref 39.0–52.0)
Hemoglobin: 8.6 g/dL — ABNORMAL LOW (ref 13.0–17.0)
Immature Granulocytes: 8 %
Lymphocytes Relative: 4 %
Lymphs Abs: 1.1 10*3/uL (ref 0.7–4.0)
MCH: 28.7 pg (ref 26.0–34.0)
MCHC: 33.7 g/dL (ref 30.0–36.0)
MCV: 85 fL (ref 80.0–100.0)
Monocytes Absolute: 1.6 10*3/uL — ABNORMAL HIGH (ref 0.1–1.0)
Monocytes Relative: 5 %
Neutro Abs: 24.1 10*3/uL — ABNORMAL HIGH (ref 1.7–7.7)
Neutrophils Relative %: 83 %
Platelets: 556 10*3/uL — ABNORMAL HIGH (ref 150–400)
RBC: 3 MIL/uL — ABNORMAL LOW (ref 4.22–5.81)
RDW: 18.4 % — ABNORMAL HIGH (ref 11.5–15.5)
Smear Review: UNDETERMINED
WBC: 29.1 10*3/uL — ABNORMAL HIGH (ref 4.0–10.5)
nRBC: 0.1 % (ref 0.0–0.2)

## 2019-08-14 LAB — COMPREHENSIVE METABOLIC PANEL
ALT: 15 U/L (ref 0–44)
AST: 19 U/L (ref 15–41)
Albumin: 1.8 g/dL — ABNORMAL LOW (ref 3.5–5.0)
Alkaline Phosphatase: 55 U/L (ref 38–126)
Anion gap: 10 (ref 5–15)
BUN: 48 mg/dL — ABNORMAL HIGH (ref 8–23)
CO2: 21 mmol/L — ABNORMAL LOW (ref 22–32)
Calcium: 8.3 mg/dL — ABNORMAL LOW (ref 8.9–10.3)
Chloride: 109 mmol/L (ref 98–111)
Creatinine, Ser: 1.26 mg/dL — ABNORMAL HIGH (ref 0.61–1.24)
GFR calc Af Amer: >60 mL/min (ref >60)
GFR calc non Af Amer: 54 mL/min — ABNORMAL LOW (ref >60)
Glucose, Bld: 169 mg/dL — ABNORMAL HIGH (ref 70–99)
Potassium: 4.1 mmol/L (ref 3.5–5.1)
Sodium: 140 mmol/L (ref 135–145)
Total Bilirubin: 0.9 mg/dL (ref 0.3–1.2)
Total Protein: 5.1 g/dL — ABNORMAL LOW (ref 6.5–8.1)

## 2019-08-14 LAB — BPAM RBC
Blood Product Expiration Date: 202012172359
ISSUE DATE / TIME: 202012091352
Unit Type and Rh: 600

## 2019-08-14 LAB — TYPE AND SCREEN
ABO/RH(D): A NEG
Antibody Screen: NEGATIVE
Unit division: 0

## 2019-08-14 LAB — MAGNESIUM: Magnesium: 2.2 mg/dL (ref 1.7–2.4)

## 2019-08-14 LAB — C-REACTIVE PROTEIN: CRP: 8.8 mg/dL — ABNORMAL HIGH (ref <1.0)

## 2019-08-14 LAB — FERRITIN: Ferritin: 419 ng/mL — ABNORMAL HIGH (ref 24–336)

## 2019-08-14 LAB — PROCALCITONIN: Procalcitonin: <0.1 ng/mL

## 2019-08-14 LAB — FIBRIN DERIVATIVES D-DIMER (ARMC ONLY): Fibrin derivatives D-dimer (ARMC): 453.38 ng/mL (FEU) (ref 0.00–499.00)

## 2019-08-14 LAB — GLUCOSE, CAPILLARY
Glucose-Capillary: 157 mg/dL — ABNORMAL HIGH (ref 70–99)
Glucose-Capillary: 260 mg/dL — ABNORMAL HIGH (ref 70–99)
Glucose-Capillary: 126 mg/dL — ABNORMAL HIGH (ref 70–99)
Glucose-Capillary: 127 mg/dL — ABNORMAL HIGH (ref 70–99)

## 2019-08-14 MED ORDER — VANCOMYCIN HCL IN DEXTROSE 1-5 GM/200ML-% IV SOLN
1000.0000 mg | INTRAVENOUS | Status: DC
  Administered 2019-08-15: 1000 mg via INTRAVENOUS
  Filled 2019-08-14: qty 200

## 2019-08-14 MED ORDER — LEVOFLOXACIN IN D5W 250 MG/50ML IV SOLN
250.0000 mg | INTRAVENOUS | Status: DC
  Administered 2019-08-15: 250 mg via INTRAVENOUS
  Filled 2019-08-14: qty 50

## 2019-08-14 MED ORDER — IPRATROPIUM-ALBUTEROL 0.5-2.5 (3) MG/3ML IN SOLN
3.0000 mL | RESPIRATORY_TRACT | Status: DC
  Administered 2019-08-14 – 2019-08-22 (×49): 3 mL via RESPIRATORY_TRACT
  Filled 2019-08-14 (×50): qty 3

## 2019-08-14 MED ORDER — FUROSEMIDE 10 MG/ML IJ SOLN
20.0000 mg | Freq: Once | INTRAMUSCULAR | Status: AC
  Administered 2019-08-14: 20 mg via INTRAVENOUS
  Filled 2019-08-14: qty 2

## 2019-08-14 MED ORDER — ALBUMIN HUMAN 25 % IV SOLN
12.5000 g | Freq: Once | INTRAVENOUS | Status: AC
  Administered 2019-08-14: 12.5 g via INTRAVENOUS
  Filled 2019-08-14: qty 50

## 2019-08-14 MED ORDER — METHYLPREDNISOLONE SODIUM SUCC 40 MG IJ SOLR
40.0000 mg | Freq: Every day | INTRAMUSCULAR | Status: DC
  Administered 2019-08-15 – 2019-08-16 (×2): 40 mg via INTRAVENOUS
  Filled 2019-08-14 (×2): qty 1

## 2019-08-14 NOTE — Progress Notes (Signed)
Pharmacy Antibiotic Note  Ryan Pittman is a 78 y.o. male admitted on 08/11/2019 with increasing SOB at rest. COVID positive approximately 4 weeks ago, treated with remdesivir 11/5 - 11/9. Pharmacy has been consulted for vancomycin dosing for pneumonia. This is day #4 of IV antibiotics, leukocytosis has worsened, CRP trending down but still elevated, PCT negative. Aztreonam was changed to levofloxacin 12/9. His renal function has worsened since yesterday.  Plan: 1) change vancomycin dose to 1000 mg IV Q 24 hrs  Goal AUC 400-550 Expected AUC: 502 T1/2: 17h Css predicted: 32.8/12.9 mcg/mL  SCr used: 1.26  2) change levofloxacin dose to 250 mg IV every 24 hours  Height: 5\' 7"  (170.2 cm) Weight: 150 lb (68 kg) IBW/kg (Calculated) : 66.1  Temp (24hrs), Avg:97.8 F (36.6 C), Min:97.5 F (36.4 C), Max:98.2 F (36.8 C)  Recent Labs  Lab 08/11/19 1003 08/11/19 1004 08/11/19 1143 08/12/19 0551 08/13/19 0414 08/14/19 0432  WBC  --  14.8*  --  13.8* 25.3* 29.1*  CREATININE  --  1.03  --  1.04 1.08 1.26*  LATICACIDVEN 1.1  --  1.1  --   --   --     Estimated Creatinine Clearance: 43.7 mL/min (A) (by C-G formula based on SCr of 1.26 mg/dL (H)).    Allergies  Allergen Reactions  . Penicillins Itching and Other (See Comments)    PATIENT HAS HAD A PCN REACTION WITH IMMEDIATE RASH, FACIAL/TONGUE/THROAT SWELLING, SOB, OR LIGHTHEADEDNESS WITH HYPOTENSION:  #  #  YES  #  #  Has patient had a PCN reaction causing severe rash involving mucus membranes or skin necrosis: No Has patient had a PCN reaction that required hospitalization: No Has patient had a PCN reaction occurring within the last 10 years: No If all of the above answers are "NO", then may proceed with Cephalosporin use.   . Ace Inhibitors Cough  . Aspirin Other (See Comments)    H/o GI bleed  . Celebrex [Celecoxib] Other (See Comments)    GI bleed  . Lipitor [Atorvastatin] Other (See Comments)    myalgias     Antimicrobials this admission: Aztreonam 12/7 >>12/9 Vancomycin 12/7 >> Levofloxacin 12/9 >>  Microbiology results: 12/7 BCx: NGTD 12/7 MRSA PCR: positive 12/7 COVID19: negative (recently treated with remdesivir 11/5-11/9)  Thank you for allowing pharmacy to be a part of this patient's care.  Dallie Piles, PharmD 08/14/2019 10:22 AM

## 2019-08-14 NOTE — Progress Notes (Addendum)
1        [place] at [place] NAME: Ryan Pittman    MR#:  [id number]  DATE OF BIRTH:  Nov 09, 1940  SUBJECTIVE:  CHIEF COMPLAINT:   Chief Complaint  Patient presents with  . Shortness of Breath  SOB +, on HFNC, hemoglobin 8.6, wife at bedside REVIEW OF SYSTEMS:  Review of Systems  Constitutional: Positive for malaise/fatigue. Negative for diaphoresis, fever and weight loss.  HENT: Negative for ear discharge, ear pain, hearing loss, nosebleeds, sore throat and tinnitus.   Eyes: Negative for blurred vision and pain.  Respiratory: Positive for shortness of breath. Negative for cough, hemoptysis and wheezing.   Cardiovascular: Negative for chest pain, palpitations, orthopnea and leg swelling.  Gastrointestinal: Negative for abdominal pain, blood in stool, constipation, diarrhea, heartburn, nausea and vomiting.  Genitourinary: Negative for dysuria, frequency and urgency.  Musculoskeletal: Negative for back pain and myalgias.  Skin: Negative for itching and rash.  Neurological: Negative for dizziness, tingling, tremors, focal weakness, seizures, weakness and headaches.  Psychiatric/Behavioral: Negative for depression. The patient is not nervous/anxious.    DRUG ALLERGIES:   Allergies  Allergen Reactions  . Penicillins Itching and Other (See Comments)    PATIENT HAS HAD A PCN REACTION WITH IMMEDIATE RASH, FACIAL/TONGUE/THROAT SWELLING, SOB, OR LIGHTHEADEDNESS WITH HYPOTENSION:  #  #  YES  #  #  Has patient had a PCN reaction causing severe rash involving mucus membranes or skin necrosis: No Has patient had a PCN reaction that required hospitalization: No Has patient had a PCN reaction occurring within the last 10 years: No If all of the above answers are "NO", then may proceed with Cephalosporin use.   . Ace Inhibitors Cough  . Aspirin Other (See Comments)    H/o GI bleed  . Celebrex [Celecoxib] Other (See Comments)    GI bleed  . Lipitor [Atorvastatin]  Other (See Comments)    myalgias   VITALS:  Blood pressure 133/64, pulse (!) 58, temperature (!) 97.5 F (36.4 C), temperature source Oral, resp. rate (!) 26, height 5\' 7"  (1.702 m), weight 68 kg, SpO2 98 %. PHYSICAL EXAMINATION:  Physical Exam HENT:     Head: Normocephalic and atraumatic.  Eyes:     Conjunctiva/sclera: Conjunctivae normal.     Pupils: Pupils are equal, round, and reactive to light.  Neck:     Thyroid: No thyromegaly.     Trachea: No tracheal deviation.  Cardiovascular:     Rate and Rhythm: Normal rate and regular rhythm.     Heart sounds: Normal heart sounds.  Pulmonary:     Effort: Pulmonary effort is normal. No respiratory distress.     Breath sounds: Normal breath sounds. No wheezing.  Chest:     Chest wall: No tenderness.  Abdominal:     General: Bowel sounds are normal. There is no distension.     Palpations: Abdomen is soft.     Tenderness: There is no abdominal tenderness.  Musculoskeletal:        General: Normal range of motion.     Cervical back: Normal range of motion and neck supple.  Skin:    General: Skin is warm and dry.     Findings: No rash.  Neurological:     Mental Status: He is alert and oriented to person, place, and time.     Cranial Nerves: No cranial nerve deficit.    LABORATORY PANEL:  Male CBC Recent Labs  Lab 08/14/19 0432  WBC 29.1*  HGB 8.6*  HCT 25.5*  PLT 556*   ------------------------------------------------------------------------------------------------------------------ Chemistries  Recent Labs  Lab 08/14/19 0432  NA 140  K 4.1  CL 109  CO2 21*  GLUCOSE 169*  BUN 48*  CREATININE 1.26*  CALCIUM 8.3*  MG 2.2  AST 19  ALT 15  ALKPHOS 55  BILITOT 0.9   RADIOLOGY:  No results found. ASSESSMENT AND PLAN:   Acute respiratory failure with hypoxia due to multifocal pneumonia and recent acute respiratory disease due to COVID-19 virus:  on heated high flow nasal cannula, - Continue to titrate FiO2 for  sats of 88 to 92% -continue Solu-Medrol to 40 mg IV twice daily. continue Levaquin & vancomycin -bronchodilators -PRN Mucinex for cough -Negative blood culture -Daily CRP, Ferritin, D-dimer - fungitell assay per PCCM -maintain an awake prone position for 16+ hours a day, if possible, with a minimum of 2-3 hours at a time -Appreciate PCCM input -concern for amiodarone toxicity based on the CT findings, holding amiodarone for now  CKD (chronic kidney disease) stage 3a, GFR 30-59 ml/min: stable.   At baseline   HTN (hypertension): -continue home meds.  Hyperlipidemia: -statin  Hypothyroidism: -synthroid, TSH in normal limits  PAF (paroxysmal atrial fibrillation) ([place]): -metoprolol -Hold amiodarone as per discussion with PCCM this could be amiodarone toxicity  Hx of CAD (coronary artery disease): -on plavix and statin  Iron deficiency anemia/acute on chronic anemia: Hgb 6.7->8.6 -s/p 1 packed red blood cell transfusion -Iron level 17 -Status post IV iron      All the records are reviewed and case discussed with Care Management/Social Worker. Management plans discussed with the patient, nursing, wife at bedside and they are in agreement.  CODE STATUS: Full Code  TOTAL TIME TAKING CARE OF THIS PATIENT: 25 minutes.   More than 50% of the time was spent in counseling/coordination of care: YES  POSSIBLE D/C IN 3-4 DAYS, DEPENDING ON CLINICAL CONDITION.   Max Sane M.D on 08/14/2019 at 2:40 PM  Between 7am to 6pm - Pager - [phone]  After 6pm go to [email or web address] - password [email or web address]  [facility]   CC: Primary care physician; Tonia Ghent, MD  Note: This dictation was prepared with Dragon dictation along with smaller phrase technology. Any transcriptional errors that result from this process are unintentional.

## 2019-08-14 NOTE — Progress Notes (Addendum)
Name: Ryan Pittman MRN: [id number] DOB: [birth year]    ADMISSION DATE:  08/11/2019 CONSULTATION DATE: 08/11/2019  REFERRING MD : Dr. Blaine Hamper   FOLLOW-UP: Shortness of breath/hypoxic respiratory failure  BRIEF PATIENT DESCRIPTION:  78 yo male discharged from [facility] 07/19/19 following treatment of COVID-19 currently admitted with acute hypoxic respiratory failure secondary to multifocal pneumonia vs. pneumonitis and pulmonary edema requiring HFNC   SIGNIFICANT EVENTS/STUDIES:  12/7: CTA Chest revealed no evidence of pulmonary embolism. Moderate bilateral multifocal airspace process with small amount of bilateral pleural fluid. Findings are likely due to multifocal pneumonia. Aortic Atherosclerosis (ICD10-I70.0) and Emphysema (ICD10-J43.9). Atherosclerotic coronary artery disease. Several hepatic cysts with the largest over the left lobe measuring 4 cm. 12/7: Pt admitted to the stepdown unit on HFNC  12/9: Received transfusion of 1 unit of PRBCs 12/10: Lasix x1, step up pulmonary toilet  CULTURES: COVID-19 12/7>>negative  POC COVID-19 12/7>>negative  MRSA PCR 12/7>> Blood x2 12/7>>  ANTIMICROBIALS: Vancomycin 12/7>> Aztreonam 12/7>> 12/9 Levaquin>>  Background leading to hospitalization:   This is an 78 yo male with a PMH of PVD, Pneumonia, Former Tobacco Abuse, IgG Gammopathy, HLD, HTN, Hypercalcemia, Dyspnea, CAD, Colon Polyps, CKD,Thyroid Cancer, Arthritis, Anginal Pain, Anemia, and GI Bleed secondary to Celebrex.  He presented to [facility] ER via EMS on 12/7 with worsening shortness of breath at rest.  Pt discharged from [facility] 07/19/19 following treatment of COVID-19 (while hospitalized received remdesivir/levaquin/iv steroids).  Pt discharged on home O2 @3L , prednisone taper, vitamin C, and vitamin D.  During current ER visit COVID-19 negative, however CXR concerning for atypical infection.  CTA Chest negative for pulmonary embolism, but revealed multifocal  pneumonia.  Lab results revealed CO2 20, BNP 304, crp 25.0, pct 0.19, wbc 14.8, hgb 7.5, fibrinogen >750, and d-dimer 1,111.73.  Due to severe hypoxia pt placed on HFNC.  He received aztreonam, vancomycin, and solumedrol.  He was subsequently admitted to the stepdown unit per hospitalist team and PCCM consulted for additional management.  PAST MEDICAL HISTORY :    Prior to Admission medications   Medication Sig Start Date End Date Taking? Authorizing Provider  acetaminophen (TYLENOL) 500 MG tablet Take 1,000 mg by mouth every 6 (six) hours as needed for mild pain.   Yes [provider]  amiodarone (PACERONE) 200 MG tablet TAKE 1 TABLET BY MOUTH DAILY Patient taking differently: Take 200 mg by mouth daily.  02/24/19  Yes [name], MD  amLODipine (NORVASC) 2.5 MG tablet TAKE ONE TABLET BY MOUTH EVERY DAY Patient taking differently: Take 2.5 mg by mouth daily.  04/25/19  Yes Tonia Ghent, MD  benzonatate (TESSALON) 200 MG capsule Take 1 capsule (200 mg total) by mouth 3 (three) times daily as needed for cough. 07/07/19  Yes Tonia Ghent, MD  Cholecalciferol (VITAMIN D) 50 MCG (2000 UT) tablet Take 2,000 Units by mouth daily.   Yes [provider]  clopidogrel (PLAVIX) 75 MG tablet TAKE ONE TABLET EVERY DAY Patient taking differently: Take 75 mg by mouth daily.  10/04/18  Yes Tonia Ghent, MD  Co-Enzyme Q-10 100 MG CAPS Take 100 mg by mouth daily.    Yes [provider]  Evolocumab (REPATHA SURECLICK) [id number] MG/ML SOAJ Inject 140 mg into the skin every 14 (fourteen) days. 02/17/19  Yes [name], MD  ferrous sulfate 325 (65 FE) MG tablet Take 1 tablet (325 mg total) by mouth daily with breakfast. 08/07/19  Yes Tonia Ghent, MD  levothyroxine (SYNTHROID, LEVOTHROID) 75 MCG tablet Take 75 mcg by mouth See admin instructions. Take 1 tablet (68mcg) by mouth every Monday, Tuesday, Wednesday, Friday, Saturday and Sunday morning and take 1 tablets  (112.68mcg) by mouth every Thursday morning   Yes [provider]  metoprolol tartrate (LOPRESSOR) 25 MG tablet Take 0.5 tablets (12.5 mg total) by mouth 2 (two) times daily. 09/05/18  Yes [name], MD  Multiple Vitamins-Minerals (MULTIVITAMIN WITH MINERALS) tablet Take 1 tablet by mouth daily. 07/19/19 07/18/20 Yes Bonnielee Haff, MD  Omega-3 Fatty Acids (FISH OIL OMEGA-3 PO) Take 1,400 Units by mouth daily.    Yes [provider]  pantoprazole (PROTONIX) 40 MG tablet Take 1 tablet (40 mg total) by mouth daily. 07/20/19 08/19/19 Yes Bonnielee Haff, MD  polyethylene glycol (MIRALAX / GLYCOLAX) 17 g packet Take 17 g by mouth daily. 07/19/19  Yes Bonnielee Haff, MD  pravastatin (PRAVACHOL) 40 MG tablet TAKE ONE TABLET BY MOUTH EVERY DAY Patient taking differently: Take 40 mg by mouth daily.  06/10/19  Yes Tonia Ghent, MD  tamsulosin (FLOMAX) 0.4 MG CAPS capsule Take 1 capsule (0.4 mg total) by mouth daily. 11/20/18  Yes Stoioff, Ronda Fairly, MD  traZODone (DESYREL) 100 MG tablet Take 50-100 mg by mouth at bedtime as needed for sleep.  06/30/19  Yes [provider]  vitamin C (ASCORBIC ACID) 500 MG tablet Take 500 mg by mouth daily.   Yes [provider]   Allergies  Allergen Reactions  . Penicillins Itching and Other (See Comments)    PATIENT HAS HAD A PCN REACTION WITH IMMEDIATE RASH, FACIAL/TONGUE/THROAT SWELLING, SOB, OR LIGHTHEADEDNESS WITH HYPOTENSION:  #  #  YES  #  #  Has patient had a PCN reaction causing severe rash involving mucus membranes or skin necrosis: No Has patient had a PCN reaction that required hospitalization: No Has patient had a PCN reaction occurring within the last 10 years: No If all of the above answers are "NO", then may proceed with Cephalosporin use.   . Ace Inhibitors Cough  . Aspirin Other (See Comments)    H/o GI bleed  . Celebrex [Celecoxib] Other (See Comments)    GI bleed  . Lipitor [Atorvastatin] Other (See  Comments)    myalgias   Current medications were reviewed with Pharm.D.  REVIEW OF SYSTEMS: Positives in BOLD  Constitutional: Negative for fever, chills, weight loss, malaise/fatigue and diaphoresis.  HENT: Negative for hearing loss, ear pain, nosebleeds, congestion, sore throat, neck pain, tinnitus and ear discharge.   Eyes: Negative for blurred vision, double vision, photophobia, pain, discharge and redness.  Respiratory: cough, hemoptysis, sputum production, shortness of breath, wheezing and stridor.   Cardiovascular: Negative for chest pain, palpitations, orthopnea, claudication, leg swelling and PND.  Gastrointestinal: Negative for heartburn, nausea, vomiting, abdominal pain, diarrhea, constipation, blood in stool and melena.  Genitourinary: Negative for dysuria, urgency, frequency, hematuria and flank pain.  Musculoskeletal: Negative for myalgias, back pain, joint pain and falls.  Skin: Negative for itching and rash.  Neurological: Negative for dizziness, tingling, tremors, sensory change, speech change, focal weakness, seizures, loss of consciousness, weakness and headaches.  Endo/Heme/Allergies: Negative for environmental allergies and polydipsia. Does not bruise/bleed easily.  SUBJECTIVE:  No distress, calm,  VITAL SIGNS: Temp:  [97.5 F (36.4 C)-98.2 F (36.8 C)] 97.5 F (36.4 C) (12/10 0754) Pulse Rate:  [52-71] 63 (12/10 0600) Resp:  [15-30] 24 (12/10 0600) BP: (97-142)/(49-91) 132/52 (12/10 0600) SpO2:  [81 %-100 %] 92 % (  12/10 0819) FiO2 (%):  [80 %-98 %] 98 % (12/10 0819)  PHYSICAL EXAMINATION: General: acutely ill appearing male, NAD on HFNC  Neuro: alert and oriented, follows commands  HEENT: supple, no JVD Cardiovascular: nsr, rrr, no R/G  Lungs: Good air entry, coarse, even, non labored, no wheezes Abdomen: +BS x4, soft, non tender, non distended  Musculoskeletal: normal bulk and tone, no edema  Skin: intact no rashes or lesions present   Recent Labs   Lab 08/12/19 0551 08/13/19 0414 08/14/19 0432  NA 141 139 140  K 4.1 4.2 4.1  CL 110 109 109  CO2 19* 21* 21*  BUN 26* 40* 48*  CREATININE 1.04 1.08 1.26*  GLUCOSE 211* 174* 169*   Recent Labs  Lab 08/12/19 0551 08/13/19 0414 08/13/19 1840 08/14/19 0432  HGB 7.0* 6.7* 9.3* 8.6*  HCT 22.6* 19.9* 28.1* 25.5*  WBC 13.8* 25.3*  --  29.1*  PLT 465* 482*  --  556*   DG Chest Port 1 View  Result Date: 08/13/2019 CLINICAL DATA:  Respiratory failure EXAM: PORTABLE CHEST 1 VIEW COMPARISON:  08/11/2019 FINDINGS: Post sternotomy and CABG with stable cardiomediastinal contours. Diffuse interstitial and airspace opacification bilaterally with areas of more confluent airspace process at the lung bases with similar appearance. No sign of acute bone finding. IMPRESSION: No significant change since prior radiograph, findings remain suspicious for diffuse atypical infection. Superimposed edema also possible. Electronically Signed   By: Zetta Bills M.D.   On: 08/13/2019 09:14   ECHOCARDIOGRAM COMPLETE  Result Date: 08/12/2019   ECHOCARDIOGRAM REPORT   Patient Name:   Ryan Pittman Date of Exam: 08/12/2019 Medical Rec #:  [id number]        Height:       67.0 in Accession #:    [id number]       Weight:       150.0 lb Date of Birth:  [birth year]        BSA:          1.79 m Patient Age:    78 years         BP:           114/45 mmHg Patient Gender: M                HR:           66 bpm. Exam Location:  [facility] Procedure: 2D Echo, Cardiac Doppler and Color Doppler Indications:     Acute respiratory insufficiency 518.82  History:         Patient has prior history of Echocardiogram examinations, most                  recent 06/10/2018. Signs/Symptoms:Dyspnea; Risk                  Factors:Hypertension. PVD.  Sonographer:     Sherrie Sport RDCS (AE) Referring Phys:  [id number] Awilda Bill Diagnosing Phys: Nelva Bush MD IMPRESSIONS  1. Left ventricular ejection fraction, by visual estimation, is 55 to 60%. The  left ventricle has normal function. There is moderately increased left ventricular hypertrophy.  2. Elevated left atrial pressure.  3. Left ventricular diastolic parameters are consistent with Grade I diastolic dysfunction (impaired relaxation).  4. The left ventricle has no regional wall motion abnormalities.  5. Global right ventricle has normal systolic function.The right ventricular size is mildly enlarged. No increase in right ventricular wall thickness.  6. Left atrial size was mildly dilated.  7. Right  atrial size was mildly dilated.  8. The pericardium was not well visualized.  9. The mitral valve is degenerative. Mild mitral valve regurgitation. 10. The tricuspid valve is normal in structure. Tricuspid valve regurgitation is trivial. 11. The aortic valve is tricuspid. Aortic valve regurgitation is not visualized. Mild to moderate aortic valve sclerosis/calcification without any evidence of aortic stenosis. 12. The pulmonic valve was not well visualized. Pulmonic valve regurgitation is not visualized. 13. The interatrial septum was not well visualized. FINDINGS  Left Ventricle: Left ventricular ejection fraction, by visual estimation, is 55 to 60%. The left ventricle has normal function. The left ventricle has no regional wall motion abnormalities. The left ventricular internal cavity size was the left ventricle is normal in size. There is moderately increased left ventricular hypertrophy. Asymmetric left ventricular hypertrophy of the basal-septal wall. Left ventricular diastolic parameters are consistent with Grade I diastolic dysfunction (impaired relaxation). Elevated left atrial pressure. Right Ventricle: The right ventricular size is mildly enlarged. No increase in right ventricular wall thickness. Global RV systolic function is has normal systolic function. Left Atrium: Left atrial size was mildly dilated. Right Atrium: Right atrial size was mildly dilated Pericardium: The pericardium was not well  visualized. Mitral Valve: The mitral valve is degenerative in appearance. There is mild thickening of the mitral valve leaflet(s). Mild mitral valve regurgitation. Tricuspid Valve: The tricuspid valve is normal in structure. Tricuspid valve regurgitation is trivial. Aortic Valve: The aortic valve is tricuspid. Aortic valve regurgitation is not visualized. Mild to moderate aortic valve sclerosis/calcification is present, without any evidence of aortic stenosis. Aortic valve mean gradient measures 4.0 mmHg. Aortic valve peak gradient measures 7.8 mmHg. Aortic valve area, by VTI measures 2.40 cm. Pulmonic Valve: The pulmonic valve was not well visualized. Pulmonic valve regurgitation is not visualized. No evidence of pulmonic stenosis. Aorta: The aortic root is normal in size and structure. Pulmonary Artery: The pulmonary artery is not well seen. Venous: The inferior vena cava was not well visualized. IAS/Shunts: The interatrial septum was not well visualized.  LEFT VENTRICLE PLAX 2D LVIDd:         4.62 cm  Diastology LVIDs:         3.10 cm  LV e' lateral:   4.79 cm/s LV PW:         0.99 cm  LV E/e' lateral: 23.4 LV IVS:        1.45 cm  LV e' medial:    5.22 cm/s LVOT diam:     2.00 cm  LV E/e' medial:  21.5 LV SV:         60 ml LV SV Index:   33.59 LVOT Area:     3.14 cm  RIGHT VENTRICLE RV Basal diam:  4.62 cm RV S prime:     11.60 cm/s TAPSE (M-mode): 3.3 cm LEFT ATRIUM             Index       RIGHT ATRIUM           Index LA diam:        4.30 cm 2.40 cm/m  RA Area:     18.60 cm LA Vol (A2C):   52.7 ml 29.45 ml/m RA Volume:   49.00 ml  27.38 ml/m LA Vol (A4C):   75.6 ml 42.25 ml/m LA Biplane Vol: 63.1 ml 35.26 ml/m  AORTIC VALVE                   PULMONIC VALVE AV Area (  Vmax):    2.22 cm    PV Vmax:        0.73 m/s AV Area (Vmean):   2.44 cm    PV Peak grad:   2.1 mmHg AV Area (VTI):     2.40 cm    RVOT Peak grad: 3 mmHg AV Vmax:           140.00 cm/s AV Vmean:          91.050 cm/s AV VTI:            0.359  m AV Peak Grad:      7.8 mmHg AV Mean Grad:      4.0 mmHg LVOT Vmax:         99.00 cm/s LVOT Vmean:        70.700 cm/s LVOT VTI:          0.274 m LVOT/AV VTI ratio: 0.76  AORTA Ao Root diam: 2.90 cm MITRAL VALVE                         TRICUSPID VALVE MV Area (PHT): 2.62 cm              TR Peak grad:   20.8 mmHg MV PHT:        84.10 msec            TR Vmax:        228.00 cm/s MV Decel Time: 290 msec MV E velocity: 112.00 cm/s 103 cm/s  SHUNTS MV A velocity: 129.00 cm/s 70.3 cm/s Systemic VTI:  0.27 m MV E/A ratio:  0.87        1.5       Systemic Diam: 2.00 cm  Nelva Bush MD Electronically signed by Nelva Bush MD Signature Date/Time: 08/12/2019/5:52:06 PM    Final     ASSESSMENT / PLAN:  Acute hypoxic respiratory failure secondary to multifocal pneumonia vs. pneumonitis and pulmonary edema Recent dx COVID-19 (discharged for [facility] 07/19/2019 repeat COVID-19 negative 08/11/2019) Supplemental O2 for dyspnea and/or hypoxia  Scheduled and PRN bronchodilator therapy  IV steroids  20 mg iv lasix x1 dose today Trend WBC and monitor fever curve  Trend PCT: <0.10 X2 Follow cultures-negative so far Continue vancomycin and Levaquin Continue vitamin C and D Echo: LVH, elevated left atrial pressure, diastolic dysfunction, mild enlargement of the right ventricle mild dilation of left atrial and right atrial size, mild mitral regurg, no overt pulmonary hypertension.  HTN-controlled Hx: HLD, PVD, and CAD s/p CABG  Continuous telemetry monitoring   Continue outpatient amlodipine, clopidogrel, and pravastatin Continue to withhold outpatient amiodarone for now   Chronic kidney disease stage III (baseline creatinine 1.3-1.5) Trend BMP  Replace electrolytes as indicated  Monitor UOP Avoid nephrotoxic medications   Acute on chronic anemia  VTE px: subq lovenox Trend CBC  Monitor for s/sx of bleeding and transfuse for hgb <7 Transfused 1 unit yesterday Aztreonam discontinued  yesterday due to side effect of anemia  GERD  Continue protonix    Discussed with Dr. Scharlene Gloss, MD [place] [facility]    This note was dictated using voice recognition software/Dragon.  Despite best efforts to proofread, errors can occur which can change the meaning.  Any change was purely unintentional.

## 2019-08-15 ENCOUNTER — Inpatient Hospital Stay: Payer: Medicare Other

## 2019-08-15 LAB — C-REACTIVE PROTEIN: CRP: 5 mg/dL — ABNORMAL HIGH (ref <1.0)

## 2019-08-15 LAB — BLOOD GAS, ARTERIAL
Acid-base deficit: 0.9 mmol/L (ref 0.0–2.0)
Bicarbonate: 22 mmol/L (ref 20.0–28.0)
FIO2: 0.86
O2 Saturation: 89.5 %
Patient temperature: 37
pCO2 arterial: 31 mmHg — ABNORMAL LOW (ref 32.0–48.0)
pH, Arterial: 7.46 — ABNORMAL HIGH (ref 7.350–7.450)
pO2, Arterial: 54 mmHg — ABNORMAL LOW (ref 83.0–108.0)

## 2019-08-15 LAB — RENAL FUNCTION PANEL
Albumin: 1.8 g/dL — ABNORMAL LOW (ref 3.5–5.0)
Anion gap: 9 (ref 5–15)
BUN: 40 mg/dL — ABNORMAL HIGH (ref 8–23)
CO2: 23 mmol/L (ref 22–32)
Calcium: 8.1 mg/dL — ABNORMAL LOW (ref 8.9–10.3)
Chloride: 105 mmol/L (ref 98–111)
Creatinine, Ser: 1.09 mg/dL (ref 0.61–1.24)
GFR calc Af Amer: >60 mL/min (ref >60)
GFR calc non Af Amer: >60 mL/min (ref >60)
Glucose, Bld: 101 mg/dL — ABNORMAL HIGH (ref 70–99)
Phosphorus: 2.9 mg/dL (ref 2.5–4.6)
Potassium: 3.9 mmol/L (ref 3.5–5.1)
Sodium: 137 mmol/L (ref 135–145)

## 2019-08-15 LAB — CBC WITH DIFFERENTIAL/PLATELET
Abs Immature Granulocytes: 5.03 10*3/uL — ABNORMAL HIGH (ref 0.00–0.07)
Basophils Absolute: 0.1 10*3/uL (ref 0.0–0.1)
Basophils Relative: 0 %
Eosinophils Absolute: 0.1 10*3/uL (ref 0.0–0.5)
Eosinophils Relative: 0 %
HCT: 28.7 % — ABNORMAL LOW (ref 39.0–52.0)
Hemoglobin: 9.3 g/dL — ABNORMAL LOW (ref 13.0–17.0)
Immature Granulocytes: 15 %
Lymphocytes Relative: 5 %
Lymphs Abs: 1.7 10*3/uL (ref 0.7–4.0)
MCH: 28.8 pg (ref 26.0–34.0)
MCHC: 32.4 g/dL (ref 30.0–36.0)
MCV: 88.9 fL (ref 80.0–100.0)
Monocytes Absolute: 1.8 10*3/uL — ABNORMAL HIGH (ref 0.1–1.0)
Monocytes Relative: 5 %
Neutro Abs: 24.5 10*3/uL — ABNORMAL HIGH (ref 1.7–7.7)
Neutrophils Relative %: 75 %
Platelets: 585 10*3/uL — ABNORMAL HIGH (ref 150–400)
RBC: 3.23 MIL/uL — ABNORMAL LOW (ref 4.22–5.81)
RDW: 18.1 % — ABNORMAL HIGH (ref 11.5–15.5)
Smear Review: UNDETERMINED
WBC: 33.2 10*3/uL — ABNORMAL HIGH (ref 4.0–10.5)
nRBC: 0.4 % — ABNORMAL HIGH (ref 0.0–0.2)

## 2019-08-15 LAB — MAGNESIUM: Magnesium: 2.1 mg/dL (ref 1.7–2.4)

## 2019-08-15 LAB — GLUCOSE, CAPILLARY
Glucose-Capillary: 107 mg/dL — ABNORMAL HIGH (ref 70–99)
Glucose-Capillary: 189 mg/dL — ABNORMAL HIGH (ref 70–99)
Glucose-Capillary: 216 mg/dL — ABNORMAL HIGH (ref 70–99)
Glucose-Capillary: 214 mg/dL — ABNORMAL HIGH (ref 70–99)

## 2019-08-15 LAB — FIBRIN DERIVATIVES D-DIMER (ARMC ONLY): Fibrin derivatives D-dimer (ARMC): 1306.06 ng/mL (FEU) — ABNORMAL HIGH (ref 0.00–499.00)

## 2019-08-15 LAB — FERRITIN: Ferritin: 946 ng/mL — ABNORMAL HIGH (ref 24–336)

## 2019-08-15 MED ORDER — SULFAMETHOXAZOLE-TRIMETHOPRIM 400-80 MG/5ML IV SOLN
15.0000 mg/kg/d | Freq: Three times a day (TID) | INTRAVENOUS | Status: DC
  Administered 2019-08-15 – 2019-08-16 (×3): 340 mg via INTRAVENOUS
  Filled 2019-08-15 (×6): qty 21.25

## 2019-08-15 MED ORDER — PANTOPRAZOLE SODIUM 40 MG PO TBEC
40.0000 mg | DELAYED_RELEASE_TABLET | Freq: Two times a day (BID) | ORAL | Status: DC
  Administered 2019-08-15 – 2019-08-28 (×26): 40 mg via ORAL
  Filled 2019-08-15 (×26): qty 1

## 2019-08-15 NOTE — Progress Notes (Signed)
Name: Ryan Pittman MRN: [id number] DOB: [birth year]    ADMISSION DATE:  08/11/2019 CONSULTATION DATE: 08/11/2019  REFERRING MD : Dr. Blaine Hamper   FOLLOW-UP: Shortness of breath/hypoxic respiratory failure  BRIEF PATIENT DESCRIPTION:  78 yo male discharged from [facility] 07/19/19 following treatment of COVID-19 currently admitted with acute hypoxic respiratory failure secondary to multifocal pneumonia vs. pneumonitis and pulmonary edema requiring HFNC   SIGNIFICANT EVENTS/STUDIES:  12/7: CTA Chest revealed no evidence of pulmonary embolism. Moderate bilateral multifocal airspace process with small amount of bilateral pleural fluid. Findings are likely due to multifocal pneumonia. Aortic Atherosclerosis (ICD10-I70.0) and Emphysema (ICD10-J43.9). Atherosclerotic coronary artery disease. Several hepatic cysts with the largest over the left lobe measuring 4 cm. 12/7: Pt admitted to the stepdown unit on HFNC  12/9: Received transfusion of 1 unit of PRBCs 12/10: Lasix x1, step up pulmonary toilet 12/11-patient continues to require 40 L/min 90% high flow nasal cannula with failure to wean.  Discussed with wife at bedside today  CULTURES: COVID-19 12/7>>negative  POC COVID-19 12/7>>negative  MRSA PCR 12/7>> Blood x2 12/7>>  ANTIMICROBIALS: Vancomycin 12/7>> Aztreonam 12/7>> 12/9 Levaquin>> Bactrim 08/15/2019 Background leading to hospitalization:   This is an 78 yo male with a PMH of PVD, Pneumonia, Former Tobacco Abuse, IgG Gammopathy, HLD, HTN, Hypercalcemia, Dyspnea, CAD, Colon Polyps, CKD,Thyroid Cancer, Arthritis, Anginal Pain, Anemia, and GI Bleed secondary to Celebrex.  He presented to [facility] ER via EMS on 12/7 with worsening shortness of breath at rest.  Pt discharged from [facility] 07/19/19 following treatment of COVID-19 (while hospitalized received remdesivir/levaquin/iv steroids).  Pt discharged on home O2 @3L , prednisone taper, vitamin C, and vitamin D.  During  current ER visit COVID-19 negative, however CXR concerning for atypical infection.  CTA Chest negative for pulmonary embolism, but revealed multifocal pneumonia.  Lab results revealed CO2 20, BNP 304, crp 25.0, pct 0.19, wbc 14.8, hgb 7.5, fibrinogen >750, and d-dimer 1,111.73.  Due to severe hypoxia pt placed on HFNC.  He received aztreonam, vancomycin, and solumedrol.  He was subsequently admitted to the stepdown unit per hospitalist team and PCCM consulted for additional management.  PAST MEDICAL HISTORY :    Prior to Admission medications   Medication Sig Start Date End Date Taking? Authorizing Provider  acetaminophen (TYLENOL) 500 MG tablet Take 1,000 mg by mouth every 6 (six) hours as needed for mild pain.   Yes [provider]  amiodarone (PACERONE) 200 MG tablet TAKE 1 TABLET BY MOUTH DAILY Patient taking differently: Take 200 mg by mouth daily.  02/24/19  Yes [name], MD  amLODipine (NORVASC) 2.5 MG tablet TAKE ONE TABLET BY MOUTH EVERY DAY Patient taking differently: Take 2.5 mg by mouth daily.  04/25/19  Yes Tonia Ghent, MD  benzonatate (TESSALON) 200 MG capsule Take 1 capsule (200 mg total) by mouth 3 (three) times daily as needed for cough. 07/07/19  Yes Tonia Ghent, MD  Cholecalciferol (VITAMIN D) 50 MCG (2000 UT) tablet Take 2,000 Units by mouth daily.   Yes [provider]  clopidogrel (PLAVIX) 75 MG tablet TAKE ONE TABLET EVERY DAY Patient taking differently: Take 75 mg by mouth daily.  10/04/18  Yes Tonia Ghent, MD  Co-Enzyme Q-10 100 MG CAPS Take 100 mg by mouth daily.    Yes [provider]  Evolocumab (REPATHA SURECLICK) [id number] MG/ML SOAJ Inject 140 mg into the skin every 14 (fourteen) days. 02/17/19  Yes [name], MD  ferrous sulfate 325 (  65 FE) MG tablet Take 1 tablet (325 mg total) by mouth daily with breakfast. 08/07/19  Yes Tonia Ghent, MD  levothyroxine (SYNTHROID, LEVOTHROID) 75 MCG tablet Take 75 mcg by mouth See  admin instructions. Take 1 tablet (75mcg) by mouth every Monday, Tuesday, Wednesday, Friday, Saturday and Sunday morning and take 1 tablets (112.38mcg) by mouth every Thursday morning   Yes [provider]  metoprolol tartrate (LOPRESSOR) 25 MG tablet Take 0.5 tablets (12.5 mg total) by mouth 2 (two) times daily. 09/05/18  Yes [name], MD  Multiple Vitamins-Minerals (MULTIVITAMIN WITH MINERALS) tablet Take 1 tablet by mouth daily. 07/19/19 07/18/20 Yes Bonnielee Haff, MD  Omega-3 Fatty Acids (FISH OIL OMEGA-3 PO) Take 1,400 Units by mouth daily.    Yes [provider]  pantoprazole (PROTONIX) 40 MG tablet Take 1 tablet (40 mg total) by mouth daily. 07/20/19 08/19/19 Yes Bonnielee Haff, MD  polyethylene glycol (MIRALAX / GLYCOLAX) 17 g packet Take 17 g by mouth daily. 07/19/19  Yes Bonnielee Haff, MD  pravastatin (PRAVACHOL) 40 MG tablet TAKE ONE TABLET BY MOUTH EVERY DAY Patient taking differently: Take 40 mg by mouth daily.  06/10/19  Yes Tonia Ghent, MD  tamsulosin (FLOMAX) 0.4 MG CAPS capsule Take 1 capsule (0.4 mg total) by mouth daily. 11/20/18  Yes Stoioff, Ronda Fairly, MD  traZODone (DESYREL) 100 MG tablet Take 50-100 mg by mouth at bedtime as needed for sleep.  06/30/19  Yes [provider]  vitamin C (ASCORBIC ACID) 500 MG tablet Take 500 mg by mouth daily.   Yes [provider]   Allergies  Allergen Reactions  . Penicillins Itching and Other (See Comments)    PATIENT HAS HAD A PCN REACTION WITH IMMEDIATE RASH, FACIAL/TONGUE/THROAT SWELLING, SOB, OR LIGHTHEADEDNESS WITH HYPOTENSION:  #  #  YES  #  #  Has patient had a PCN reaction causing severe rash involving mucus membranes or skin necrosis: No Has patient had a PCN reaction that required hospitalization: No Has patient had a PCN reaction occurring within the last 10 years: No If all of the above answers are "NO", then may proceed with Cephalosporin use.   . Ace Inhibitors Cough  .  Aspirin Other (See Comments)    H/o GI bleed  . Celebrex [Celecoxib] Other (See Comments)    GI bleed  . Lipitor [Atorvastatin] Other (See Comments)    myalgias   Current medications were reviewed with Pharm.D.  REVIEW OF SYSTEMS: Positives in BOLD  Constitutional: Negative for fever, chills, weight loss, malaise/fatigue and diaphoresis.  HENT: Negative for hearing loss, ear pain, nosebleeds, congestion, sore throat, neck pain, tinnitus and ear discharge.   Eyes: Negative for blurred vision, double vision, photophobia, pain, discharge and redness.  Respiratory: cough, hemoptysis, sputum production, shortness of breath, wheezing and stridor.   Cardiovascular: Negative for chest pain, palpitations, orthopnea, claudication, leg swelling and PND.  Gastrointestinal: Negative for heartburn, nausea, vomiting, abdominal pain, diarrhea, constipation, blood in stool and melena.  Genitourinary: Negative for dysuria, urgency, frequency, hematuria and flank pain.  Musculoskeletal: Negative for myalgias, back pain, joint pain and falls.  Skin: Negative for itching and rash.  Neurological: Negative for dizziness, tingling, tremors, sensory change, speech change, focal weakness, seizures, loss of consciousness, weakness and headaches.  Endo/Heme/Allergies: Negative for environmental allergies and polydipsia. Does not bruise/bleed easily.  SUBJECTIVE:  No distress, calm,  VITAL SIGNS: Temp:  [97.6 F (36.4 C)-98 F (36.7 C)] 97.6 F (36.4 C) (12/11 0800) Pulse Rate:  [  49-85] 73 (12/11 0900) Resp:  [14-26] 20 (12/11 0900) BP: (108-149)/(24-64) 122/46 (12/11 0835) SpO2:  [87 %-100 %] 91 % (12/11 0900) FiO2 (%):  [88 %-98 %] 88 % (12/11 0800)  PHYSICAL EXAMINATION: General: acutely ill appearing male, NAD on HFNC  Neuro: alert and oriented, follows commands  HEENT: supple, no JVD Cardiovascular: nsr, rrr, no R/G  Lungs: Good air entry, coarse, even, non labored, no wheezes Abdomen: +BS x4,  soft, non tender, non distended  Musculoskeletal: normal bulk and tone, no edema  Skin: intact no rashes or lesions present   Recent Labs  Lab 08/13/19 0414 08/14/19 0432 08/15/19 0741  NA 139 140 137  K 4.2 4.1 3.9  CL 109 109 105  CO2 21* 21* 23  BUN 40* 48* 40*  CREATININE 1.08 1.26* 1.09  GLUCOSE 174* 169* 101*   Recent Labs  Lab 08/13/19 0414 08/13/19 1840 08/14/19 0432 08/15/19 0741  HGB 6.7* 9.3* 8.6* 9.3*  HCT 19.9* 28.1* 25.5* 28.7*  WBC 25.3*  --  29.1* 33.2*  PLT 482*  --  556* 585*   DG Chest Port 1 View  Result Date: 08/15/2019 CLINICAL DATA:  Respiratory failure with hypoxia. EXAM: PORTABLE CHEST 1 VIEW COMPARISON:  Radiograph 08/13/2019. CT 08/11/2019 FINDINGS: Post median sternotomy. Heart is normal in size. Unchanged appearance of the chest with diffuse fine reticular opacities in both lungs. No pleural fluid or pneumothorax. IMPRESSION: Unchanged appearance of the chest with diffuse fine reticular opacities in both lungs. This may represent pneumonia, pulmonary edema, or combination thereof. Electronically Signed   By: Keith Rake M.D.   On: 08/15/2019 03:54    ASSESSMENT / PLAN:  Acute hypoxic respiratory failure  - Patient initially dcd from [place] on 4L/min [place]   - he has been diuresed with net >4L negative fluid balance  - he is on steroids and has been on steroids since middle of last month  - currently on empiric abx with levaquin and vancomycin  - query PCP pneumonia   - will obtain fungitell and place on bactrim empirically  Continue vitamin C and D Echo: LVH, elevated left atrial pressure, diastolic dysfunction, mild enlargement of the right ventricle mild dilation of left atrial and right atrial size, mild mitral regurg, no overt pulmonary hypertension.    Chronic kidney disease stage III (baseline creatinine 1.3-1.5) Trend BMP  Replace electrolytes as indicated  Monitor UOP Avoid nephrotoxic medications   Acute on chronic anemia    VTE px: subq lovenox Trend CBC  Monitor for s/sx of bleeding and transfuse for hgb <7 Transfused 1 unit yesterday Aztreonam discontinued yesterday due to side effect of anemia  GERD  Continue protonix    Critical care provider statement:    Critical care time (minutes):  33   Critical care time was exclusive of:  Separately billable procedures and  treating other patients   Critical care was necessary to treat or prevent imminent or  life-threatening deterioration of the following conditions: acute hypoxemic respiratory failure, post covid pneumonitis, ckd, multiple comorbid conditions.    Critical care was time spent personally by me on the following  activities:  Development of treatment plan with patient or surrogate,  discussions with consultants, evaluation of patient's response to  treatment, examination of patient, obtaining history from patient or  surrogate, ordering and performing treatments and interventions, ordering  and review of laboratory studies and re-evaluation of patient's condition   I assumed direction of critical care for this patient from another  provider in my specialty: no      This note was dictated using voice recognition software/Dragon.  Despite best efforts to proofread, errors can occur which can change the meaning.  Any change was purely unintentional.

## 2019-08-15 NOTE — TOC Initial Note (Signed)
Transition of Care [facility]) - Initial/Assessment Note    Patient Details  Name: Ryan Pittman MRN: [id number] Date of Birth: August 11, 1941  Transition of [facility]) CM/SW Contact:    Shade Flood, LCSW Phone Number: 08/15/2019, 2:32 PM  Clinical Narrative:                  Pt admitted from home. He is high risk for readmission. Pt had Covid about a month ago and was at [facility]. He discharged home with O2 at 4L from [place]. Pt lives with his wife. He was still working prior to his Covid diagnosis. He was also very active. Pt currently on 40L HFNC. Workup in progress.  TOC will follow and assist with dc planning as needed.  Expected Discharge Plan: [place] Barriers to Discharge: Continued Medical Work up   Patient Goals and CMS Choice        Expected Discharge Plan and Services Expected Discharge Plan: [place]       Living arrangements for the past 2 months: Single Family Home                                      Prior Living Arrangements/Services Living arrangements for the past 2 months: Single Family Home Lives with:: Spouse Patient language and need for interpreter reviewed:: Yes Do you feel safe going back to the place where you live?: Yes      Need for Family Participation in Patient Care: No (Comment) Care giver support system in place?: Yes (comment) Current home services: DME Criminal Activity/Legal Involvement Pertinent to Current Situation/Hospitalization: No - Comment as needed  Activities of Daily Living Home Assistive Devices/Equipment: None ADL Screening (condition at time of admission) Patient's cognitive ability adequate to safely complete daily activities?: Yes Is the patient deaf or have difficulty hearing?: No Does the patient have difficulty seeing, even when wearing glasses/contacts?: No Does the patient have difficulty concentrating, remembering, or making decisions?: No Patient able to express need  for assistance with ADLs?: Yes Does the patient have difficulty dressing or bathing?: No Independently performs ADLs?: Yes (appropriate for developmental age) Does the patient have difficulty walking or climbing stairs?: No Weakness of Legs: Both Weakness of Arms/Hands: None  Permission Sought/Granted                  Emotional Assessment Appearance:: Appears younger than stated age Attitude/Demeanor/Rapport: Unable to Assess Affect (typically observed): Unable to Assess Orientation: : Oriented to Self, Oriented to Place, Oriented to Situation, Oriented to  Time Alcohol / Substance Use: Not Applicable Psych Involvement: No (comment)  Admission diagnosis:  Hypoxia [R09.02] Multifocal pneumonia [J18.9] Patient Active Problem List   Diagnosis Date Noted  . Multifocal pneumonia 08/11/2019  . Acute respiratory disease due to COVID-19 virus 08/11/2019  . COVID-19 virus infection   . Acute renal failure superimposed on stage 3 chronic kidney disease ([place]) 07/09/2019  . Pneumonia due to COVID-19 virus 07/09/2019  . Normocytic anemia 07/09/2019  . Ankle pain 07/02/2019  . Viral syndrome 11/06/2018  . CAD (coronary artery disease) 08/08/2018  . PAF (paroxysmal atrial fibrillation) ([place]) 08/06/2018  . Hx of CABG 07/11/2018  . Health care maintenance 04/04/2018  . Atypical chest pain 04/04/2018  . Hypothyroidism 03/30/2017  . Advance care planning 03/30/2017  . 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 Renal artery stenosis ([place]) 07/04/2016  . 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 Hyperglycemia 12/28/2015  . Hyperlipidemia 12/28/2015  . Thyroid cancer ([place]) 11/12/2014  . Shortness of breath 04/22/2014  . Spasm 04/22/2014  . Other cervical disc degeneration, unspecified cervical region 04/08/2014  . RLS (restless legs syndrome) 11/16/2013  . Insomnia 06/18/2013  . Medicare annual wellness visit, subsequent 03/12/2012  . HTN (hypertension) 08/14/2011  . BPH (benign  prostatic hyperplasia) 04/28/2011  . CKD (chronic kidney disease) stage 3, GFR 30-59 ml/min 02/13/2011  . OA (osteoarthritis) 02/13/2011  . Carotid stenosis 02/13/2011  . ED (erectile dysfunction) 02/13/2011  . Duodenal ulcer 02/13/2011  . Diverticulosis 02/13/2011  . PVD (peripheral vascular disease) ([place]) 01/26/2011  . S/P bypass graft of extremity 01/26/2011  . Back pain 01/26/2011  . 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 Reduced libido 02/14/2010  . Nocturia 05/12/2008  . Urinary hesitancy 05/12/2008   PCP:  Tonia Ghent, MD Pharmacy:   [facility], [address] [place] [address] Phone: [phone] Fax: [phone]  [place], [place] [address] [place] [address] Phone: [phone] Fax: [phone]     Social Determinants of Health (SDOH) Interventions    Readmission Risk Interventions Readmission Risk Prevention Plan 08/15/2019 07/15/2019  Transportation Screening Complete Complete  Palliative Care Screening Not Applicable -  Medication Review (RN Care Manager) Complete Complete  Some recent data might be hidden

## 2019-08-15 NOTE — Progress Notes (Signed)
1        [facility] [place] NAME: Gurminder Najafi    MR#:  [id number]  DATE OF BIRTH:  1941/03/12  SUBJECTIVE:  CHIEF COMPLAINT:   Chief Complaint  Patient presents with  . Shortness of Breath  SOB improving, remains on HFNC, hemoglobin 9.3, wife at bedside REVIEW OF SYSTEMS:  Review of Systems  Constitutional: Positive for malaise/fatigue. Negative for diaphoresis, fever and weight loss.  HENT: Negative for ear discharge, ear pain, hearing loss, nosebleeds, sore throat and tinnitus.   Eyes: Negative for blurred vision and pain.  Respiratory: Positive for shortness of breath. Negative for cough, hemoptysis and wheezing.   Cardiovascular: Negative for chest pain, palpitations, orthopnea and leg swelling.  Gastrointestinal: Negative for abdominal pain, blood in stool, constipation, diarrhea, heartburn, nausea and vomiting.  Genitourinary: Negative for dysuria, frequency and urgency.  Musculoskeletal: Negative for back pain and myalgias.  Skin: Negative for itching and rash.  Neurological: Negative for dizziness, tingling, tremors, focal weakness, seizures, weakness and headaches.  Psychiatric/Behavioral: Negative for depression. The patient is not nervous/anxious.    DRUG ALLERGIES:   Allergies  Allergen Reactions  . Penicillins Itching and Other (See Comments)    PATIENT HAS HAD A PCN REACTION WITH IMMEDIATE RASH, FACIAL/TONGUE/THROAT SWELLING, SOB, OR LIGHTHEADEDNESS WITH HYPOTENSION:  #  #  YES  #  #  Has patient had a PCN reaction causing severe rash involving mucus membranes or skin necrosis: No Has patient had a PCN reaction that required hospitalization: No Has patient had a PCN reaction occurring within the last 10 years: No If all of the above answers are "NO", then may proceed with Cephalosporin use.   . Ace Inhibitors Cough  . Aspirin Other (See Comments)    H/o GI bleed  . Celebrex [Celecoxib] Other (See Comments)    GI bleed  . Lipitor  [Atorvastatin] Other (See Comments)    myalgias   VITALS:  Blood pressure (!) 102/46, pulse 71, temperature 98.2 F (36.8 C), temperature source Oral, resp. rate (!) 25, height 5\' 7"  (1.702 m), weight 68 kg, SpO2 96 %. PHYSICAL EXAMINATION:  Physical Exam HENT:     Head: Normocephalic and atraumatic.  Eyes:     Conjunctiva/sclera: Conjunctivae normal.     Pupils: Pupils are equal, round, and reactive to light.  Neck:     Thyroid: No thyromegaly.     Trachea: No tracheal deviation.  Cardiovascular:     Rate and Rhythm: Normal rate and regular rhythm.     Heart sounds: Normal heart sounds.  Pulmonary:     Effort: Pulmonary effort is normal. No respiratory distress.     Breath sounds: Normal breath sounds. No wheezing.  Chest:     Chest wall: No tenderness.  Abdominal:     General: Bowel sounds are normal. There is no distension.     Palpations: Abdomen is soft.     Tenderness: There is no abdominal tenderness.  Musculoskeletal:        General: Normal range of motion.     Cervical back: Normal range of motion and neck supple.  Skin:    General: Skin is warm and dry.     Findings: No rash.  Neurological:     Mental Status: He is alert and oriented to person, place, and time.     Cranial Nerves: No cranial nerve deficit.    LABORATORY PANEL:  Male CBC Recent Labs  Lab 08/15/19 0741  WBC 33.2*  HGB 9.3*  HCT 28.7*  PLT 585*   ------------------------------------------------------------------------------------------------------------------ Chemistries  Recent Labs  Lab 08/14/19 0432 08/15/19 0741  NA 140 137  K 4.1 3.9  CL 109 105  CO2 21* 23  GLUCOSE 169* 101*  BUN 48* 40*  CREATININE 1.26* 1.09  CALCIUM 8.3* 8.1*  MG 2.2 2.1  AST 19  --   ALT 15  --   ALKPHOS 55  --   BILITOT 0.9  --    RADIOLOGY:  DG Chest Port 1 View  Result Date: 08/15/2019 CLINICAL DATA:  Respiratory failure with hypoxia. EXAM: PORTABLE CHEST 1 VIEW COMPARISON:  Radiograph  08/13/2019. CT 08/11/2019 FINDINGS: Post median sternotomy. Heart is normal in size. Unchanged appearance of the chest with diffuse fine reticular opacities in both lungs. No pleural fluid or pneumothorax. IMPRESSION: Unchanged appearance of the chest with diffuse fine reticular opacities in both lungs. This may represent pneumonia, pulmonary edema, or combination thereof. Electronically Signed   By: Keith Rake M.D.   On: 08/15/2019 03:54   ASSESSMENT AND PLAN:   Acute respiratory failure with hypoxia due to multifocal pneumonia and recent acute respiratory disease due to COVID-19 virus:  on heated high flow nasal cannula 90%, 40 L/min - Continue to titrate FiO2 for sats of 88 to 92% -continue Solu-Medrol to 40 mg IV twice daily.  Antibiotics changed to Bactrim per PCCM -concern for possible PCP pneumonia -bronchodilators -PRN Mucinex for cough -Negative blood culture -Daily CRP, Ferritin, D-dimer - fungitell assay per PCCM -maintain an awake prone position for 16+ hours a day, if possible, with a minimum of 2-3 hours at a time -Appreciate PCCM input -concern for amiodarone toxicity based on the CT findings, holding amiodarone for now  CKD (chronic kidney disease) stage 3a, GFR 30-59 ml/min: stable.   At baseline   HTN (hypertension): -continue home meds.  Hyperlipidemia: -statin  Hypothyroidism: -synthroid, TSH in normal limits  PAF (paroxysmal atrial fibrillation) ([place]): -metoprolol -Hold amiodarone as per discussion with PCCM this could be amiodarone toxicity  Hx of CAD (coronary artery disease): -on plavix and statin  Iron deficiency anemia/acute on chronic anemia: Hgb 6.7->9.3 -s/p 1 packed red blood cell transfusion -Iron level 17 -Status post IV iron      All the records are reviewed and case discussed with Care Management/Social Worker. Management plans discussed with the patient, nursing, wife at bedside and they are in agreement.  CODE STATUS: Full  Code  TOTAL TIME TAKING CARE OF THIS PATIENT: 25 minutes.   More than 50% of the time was spent in counseling/coordination of care: YES  POSSIBLE D/C IN 3-4 DAYS, DEPENDING ON CLINICAL CONDITION.   Max Sane M.D on 08/15/2019 at 4:20 PM  Between 7am to 6pm - Pager - [phone]  After 6pm go to [email or web address] - password [email or web address]  [facility]   CC: Primary care physician; Tonia Ghent, MD  Note: This dictation was prepared with Dragon dictation along with smaller phrase technology. Any transcriptional errors that result from this process are unintentional.

## 2019-08-16 DIAGNOSIS — N179 Acute kidney failure, unspecified: Secondary | ICD-10-CM

## 2019-08-16 DIAGNOSIS — N189 Chronic kidney disease, unspecified: Secondary | ICD-10-CM

## 2019-08-16 DIAGNOSIS — I10 Essential (primary) hypertension: Secondary | ICD-10-CM

## 2019-08-16 DIAGNOSIS — D649 Anemia, unspecified: Secondary | ICD-10-CM

## 2019-08-16 DIAGNOSIS — J9621 Acute and chronic respiratory failure with hypoxia: Secondary | ICD-10-CM

## 2019-08-16 DIAGNOSIS — E890 Postprocedural hypothyroidism: Secondary | ICD-10-CM

## 2019-08-16 DIAGNOSIS — I480 Paroxysmal atrial fibrillation: Secondary | ICD-10-CM

## 2019-08-16 LAB — CBC WITH DIFFERENTIAL/PLATELET
Abs Immature Granulocytes: 6.86 10*3/uL — ABNORMAL HIGH (ref 0.00–0.07)
Basophils Absolute: 0 10*3/uL (ref 0.0–0.1)
Basophils Relative: 0 %
Eosinophils Absolute: 0.1 10*3/uL (ref 0.0–0.5)
Eosinophils Relative: 0 %
HCT: 25.8 % — ABNORMAL LOW (ref 39.0–52.0)
Hemoglobin: 8.7 g/dL — ABNORMAL LOW (ref 13.0–17.0)
Immature Granulocytes: 18 %
Lymphocytes Relative: 4 %
Lymphs Abs: 1.5 10*3/uL (ref 0.7–4.0)
MCH: 28.7 pg (ref 26.0–34.0)
MCHC: 33.7 g/dL (ref 30.0–36.0)
MCV: 85.1 fL (ref 80.0–100.0)
Monocytes Absolute: 1.8 10*3/uL — ABNORMAL HIGH (ref 0.1–1.0)
Monocytes Relative: 5 %
Neutro Abs: 27 10*3/uL — ABNORMAL HIGH (ref 1.7–7.7)
Neutrophils Relative %: 73 %
Platelets: 517 10*3/uL — ABNORMAL HIGH (ref 150–400)
RBC: 3.03 MIL/uL — ABNORMAL LOW (ref 4.22–5.81)
RDW: 18.1 % — ABNORMAL HIGH (ref 11.5–15.5)
WBC: 37.2 10*3/uL — ABNORMAL HIGH (ref 4.0–10.5)
nRBC: 0.8 % — ABNORMAL HIGH (ref 0.0–0.2)

## 2019-08-16 LAB — GLUCOSE, CAPILLARY
Glucose-Capillary: 113 mg/dL — ABNORMAL HIGH (ref 70–99)
Glucose-Capillary: 103 mg/dL — ABNORMAL HIGH (ref 70–99)
Glucose-Capillary: 227 mg/dL — ABNORMAL HIGH (ref 70–99)
Glucose-Capillary: 204 mg/dL — ABNORMAL HIGH (ref 70–99)

## 2019-08-16 LAB — BASIC METABOLIC PANEL
Anion gap: 8 (ref 5–15)
BUN: 32 mg/dL — ABNORMAL HIGH (ref 8–23)
CO2: 24 mmol/L (ref 22–32)
Calcium: 7.9 mg/dL — ABNORMAL LOW (ref 8.9–10.3)
Chloride: 106 mmol/L (ref 98–111)
Creatinine, Ser: 1.15 mg/dL (ref 0.61–1.24)
GFR calc Af Amer: >60 mL/min (ref >60)
GFR calc non Af Amer: 60 mL/min — ABNORMAL LOW (ref >60)
Glucose, Bld: 88 mg/dL (ref 70–99)
Potassium: 4.2 mmol/L (ref 3.5–5.1)
Sodium: 138 mmol/L (ref 135–145)

## 2019-08-16 LAB — FIBRIN DERIVATIVES D-DIMER (ARMC ONLY): Fibrin derivatives D-dimer (ARMC): 1240.67 ng/mL (FEU) — ABNORMAL HIGH (ref 0.00–499.00)

## 2019-08-16 LAB — MAGNESIUM: Magnesium: 2.1 mg/dL (ref 1.7–2.4)

## 2019-08-16 LAB — CULTURE, BLOOD (ROUTINE X 2)
Culture: NO GROWTH
Culture: NO GROWTH
Special Requests: ADEQUATE
Special Requests: ADEQUATE

## 2019-08-16 LAB — C-REACTIVE PROTEIN: CRP: 5.3 mg/dL — ABNORMAL HIGH (ref <1.0)

## 2019-08-16 LAB — FERRITIN: Ferritin: 1210 ng/mL — ABNORMAL HIGH (ref 24–336)

## 2019-08-16 MED ORDER — SULFAMETHOXAZOLE-TRIMETHOPRIM 800-160 MG PO TABS
2.0000 | ORAL_TABLET | Freq: Three times a day (TID) | ORAL | Status: DC
  Administered 2019-08-16 – 2019-08-18 (×6): 2 via ORAL
  Filled 2019-08-16 (×8): qty 2

## 2019-08-16 MED ORDER — PREDNISONE 20 MG PO TABS
20.0000 mg | ORAL_TABLET | Freq: Every day | ORAL | Status: DC
  Administered 2019-08-17 – 2019-08-26 (×10): 20 mg via ORAL
  Filled 2019-08-16 (×10): qty 1

## 2019-08-16 NOTE — Progress Notes (Signed)
Name: Ryan Pittman MRN: [id number] DOB: Aug 01, 1941    ADMISSION DATE:  08/11/2019 CONSULTATION DATE: 08/11/2019  REFERRING MD : Dr. Blaine Hamper   FOLLOW-UP: Shortness of breath/hypoxic respiratory failure  BRIEF PATIENT DESCRIPTION:  78 yo male discharged from [facility] 07/19/19 following treatment of COVID-19 currently admitted with acute hypoxic respiratory failure secondary to multifocal pneumonia vs. pneumonitis and pulmonary edema requiring HFNC   SIGNIFICANT EVENTS/STUDIES:  12/7: CTA Chest revealed no evidence of pulmonary embolism. Moderate bilateral multifocal airspace process with small amount of bilateral pleural fluid. Findings are likely due to multifocal pneumonia. Aortic Atherosclerosis (ICD10-I70.0) and Emphysema (ICD10-J43.9). Atherosclerotic coronary artery disease. Several hepatic cysts with the largest over the left lobe measuring 4 cm. 12/7: Pt admitted to the stepdown unit on HFNC  12/9: Received transfusion of 1 unit of PRBCs 12/10: Lasix x1, step up pulmonary toilet 12/11-patient continues to require 40 L/min 90% high flow nasal cannula with failure to wean.  Discussed with wife at bedside today 12/12 - patient remains on HFNC unable to wean, he is using IS able to take 740ml tidal volume, in no distress while at rest. Uses acapella device brings up small amt of phlegm. He may need LTAC I anticipate slow wean from O2.  CULTURES: COVID-19 12/7>>negative  POC COVID-19 12/7>>negative  MRSA PCR 12/7>> Blood x2 12/7>>  ANTIMICROBIALS: Vancomycin 12/7>> Aztreonam 12/7>> 12/9 Levaquin>> Bactrim 08/15/2019  Background leading to hospitalization:   This is an 78 yo male with a PMH of PVD, Pneumonia, Former Tobacco Abuse, IgG Gammopathy, HLD, HTN, Hypercalcemia, Dyspnea, CAD, Colon Polyps, CKD,Thyroid Cancer, Arthritis, Anginal Pain, Anemia, and GI Bleed secondary to Celebrex.  He presented to [facility] ER via EMS on 12/7 with worsening shortness of breath at rest.   Pt discharged from [facility] 07/19/19 following treatment of COVID-19 (while hospitalized received remdesivir/levaquin/iv steroids).  Pt discharged on home O2 @3L , prednisone taper, vitamin C, and vitamin D.  During current ER visit COVID-19 negative, however CXR concerning for atypical infection.  CTA Chest negative for pulmonary embolism, but revealed multifocal pneumonia.  Lab results revealed CO2 20, BNP 304, crp 25.0, pct 0.19, wbc 14.8, hgb 7.5, fibrinogen >750, and d-dimer 1,111.73.  Due to severe hypoxia pt placed on HFNC.  He received aztreonam, vancomycin, and solumedrol.  He was subsequently admitted to the stepdown unit per hospitalist team and PCCM consulted for additional management.  PAST MEDICAL HISTORY :    Prior to Admission medications   Medication Sig Start Date End Date Taking? Authorizing Provider  acetaminophen (TYLENOL) 500 MG tablet Take 1,000 mg by mouth every 6 (six) hours as needed for mild pain.   Yes [provider]  amiodarone (PACERONE) 200 MG tablet TAKE 1 TABLET BY MOUTH DAILY Patient taking differently: Take 200 mg by mouth daily.  02/24/19  Yes [name], MD  amLODipine (NORVASC) 2.5 MG tablet TAKE ONE TABLET BY MOUTH EVERY DAY Patient taking differently: Take 2.5 mg by mouth daily.  04/25/19  Yes Tonia Ghent, MD  benzonatate (TESSALON) 200 MG capsule Take 1 capsule (200 mg total) by mouth 3 (three) times daily as needed for cough. 07/07/19  Yes Tonia Ghent, MD  Cholecalciferol (VITAMIN D) 50 MCG (2000 UT) tablet Take 2,000 Units by mouth daily.   Yes [provider]  clopidogrel (PLAVIX) 75 MG tablet TAKE ONE TABLET EVERY DAY Patient taking differently: Take 75 mg by mouth daily.  10/04/18  Yes Tonia Ghent, MD  Co-Enzyme  Q-10 100 MG CAPS Take 100 mg by mouth daily.    Yes [provider]  Evolocumab (REPATHA SURECLICK) [id number] MG/ML SOAJ Inject 140 mg into the skin every 14 (fourteen) days. 02/17/19  Yes  [name], MD  ferrous sulfate 325 (65 FE) MG tablet Take 1 tablet (325 mg total) by mouth daily with breakfast. 08/07/19  Yes Tonia Ghent, MD  levothyroxine (SYNTHROID, LEVOTHROID) 75 MCG tablet Take 75 mcg by mouth See admin instructions. Take 1 tablet (40mcg) by mouth every Monday, Tuesday, Wednesday, Friday, Saturday and Sunday morning and take 1 tablets (112.25mcg) by mouth every Thursday morning   Yes [provider]  metoprolol tartrate (LOPRESSOR) 25 MG tablet Take 0.5 tablets (12.5 mg total) by mouth 2 (two) times daily. 09/05/18  Yes [name], MD  Multiple Vitamins-Minerals (MULTIVITAMIN WITH MINERALS) tablet Take 1 tablet by mouth daily. 07/19/19 07/18/20 Yes Bonnielee Haff, MD  Omega-3 Fatty Acids (FISH OIL OMEGA-3 PO) Take 1,400 Units by mouth daily.    Yes [provider]  pantoprazole (PROTONIX) 40 MG tablet Take 1 tablet (40 mg total) by mouth daily. 07/20/19 08/19/19 Yes Bonnielee Haff, MD  polyethylene glycol (MIRALAX / GLYCOLAX) 17 g packet Take 17 g by mouth daily. 07/19/19  Yes Bonnielee Haff, MD  pravastatin (PRAVACHOL) 40 MG tablet TAKE ONE TABLET BY MOUTH EVERY DAY Patient taking differently: Take 40 mg by mouth daily.  06/10/19  Yes Tonia Ghent, MD  tamsulosin (FLOMAX) 0.4 MG CAPS capsule Take 1 capsule (0.4 mg total) by mouth daily. 11/20/18  Yes Stoioff, Ronda Fairly, MD  traZODone (DESYREL) 100 MG tablet Take 50-100 mg by mouth at bedtime as needed for sleep.  06/30/19  Yes [provider]  vitamin C (ASCORBIC ACID) 500 MG tablet Take 500 mg by mouth daily.   Yes [provider]   Allergies  Allergen Reactions  . Penicillins Itching and Other (See Comments)    PATIENT HAS HAD A PCN REACTION WITH IMMEDIATE RASH, FACIAL/TONGUE/THROAT SWELLING, SOB, OR LIGHTHEADEDNESS WITH HYPOTENSION:  #  #  YES  #  #  Has patient had a PCN reaction causing severe rash involving mucus membranes or skin necrosis: No Has patient had a  PCN reaction that required hospitalization: No Has patient had a PCN reaction occurring within the last 10 years: No If all of the above answers are "NO", then may proceed with Cephalosporin use.   . Ace Inhibitors Cough  . Aspirin Other (See Comments)    H/o GI bleed  . Celebrex [Celecoxib] Other (See Comments)    GI bleed  . Lipitor [Atorvastatin] Other (See Comments)    myalgias   Current medications were reviewed with Pharm.D.  REVIEW OF SYSTEMS: Positives in BOLD  Constitutional: Negative for fever, chills, weight loss, malaise/fatigue and diaphoresis.  HENT: Negative for hearing loss, ear pain, nosebleeds, congestion, sore throat, neck pain, tinnitus and ear discharge.   Eyes: Negative for blurred vision, double vision, photophobia, pain, discharge and redness.  Respiratory: cough, hemoptysis, sputum production, shortness of breath, wheezing and stridor.   Cardiovascular: Negative for chest pain, palpitations, orthopnea, claudication, leg swelling and PND.  Gastrointestinal: Negative for heartburn, nausea, vomiting, abdominal pain, diarrhea, constipation, blood in stool and melena.  Genitourinary: Negative for dysuria, urgency, frequency, hematuria and flank pain.  Musculoskeletal: Negative for myalgias, back pain, joint pain and falls.  Skin: Negative for itching and rash.  Neurological: Negative for dizziness, tingling, tremors, sensory change, speech change, focal weakness,  seizures, loss of consciousness, weakness and headaches.  Endo/Heme/Allergies: Negative for environmental allergies and polydipsia. Does not bruise/bleed easily.  SUBJECTIVE:  No distress, calm,  VITAL SIGNS: Temp:  [97.5 F (36.4 C)-98.2 F (36.8 C)] 97.5 F (36.4 C) (12/12 0800) Pulse Rate:  [54-90] 67 (12/12 1000) Resp:  [13-31] 17 (12/12 1000) BP: (102-139)/(31-87) 133/55 (12/12 1000) SpO2:  [88 %-100 %] 93 % (12/12 1100) FiO2 (%):  [80 %-95 %] 94 % (12/12 1100)  PHYSICAL  EXAMINATION: General: acutely ill appearing male, NAD on HFNC  Neuro: alert and oriented, follows commands  HEENT: supple, no JVD Cardiovascular: nsr, rrr, no R/G  Lungs: Good air entry, coarse, even, non labored, no wheezes Abdomen: +BS x4, soft, non tender, non distended  Musculoskeletal: normal bulk and tone, no edema  Skin: intact no rashes or lesions present   Recent Labs  Lab 08/14/19 0432 08/15/19 0741 08/16/19 0509  NA 140 137 138  K 4.1 3.9 4.2  CL 109 105 106  CO2 21* 23 24  BUN 48* 40* 32*  CREATININE 1.26* 1.09 1.15  GLUCOSE 169* 101* 88   Recent Labs  Lab 08/14/19 0432 08/15/19 0741 08/16/19 0509  HGB 8.6* 9.3* 8.7*  HCT 25.5* 28.7* 25.8*  WBC 29.1* 33.2* 37.2*  PLT 556* 585* 517*   DG Chest Port 1 View  Result Date: 08/15/2019 CLINICAL DATA:  Respiratory failure with hypoxia. EXAM: PORTABLE CHEST 1 VIEW COMPARISON:  Radiograph 08/13/2019. CT 08/11/2019 FINDINGS: Post median sternotomy. Heart is normal in size. Unchanged appearance of the chest with diffuse fine reticular opacities in both lungs. No pleural fluid or pneumothorax. IMPRESSION: Unchanged appearance of the chest with diffuse fine reticular opacities in both lungs. This may represent pneumonia, pulmonary edema, or combination thereof. Electronically Signed   By: Keith Rake M.D.   On: 08/15/2019 03:54    ASSESSMENT / PLAN:  Acute hypoxic respiratory failure  - Patient initially dcd from [place] on 4L/min [place]   - he has been diuresed with net >4L negative fluid balance  - has been on steroids since [place] admission  - currently on empiric abx with levaquin and vancomycin  - query PCP pneumonia   - will obtain fungitell and place on bactrim empirically -will do PO to keep fluid infusion minimal Continue vitamin C and D Echo: LVH, elevated left atrial pressure, diastolic dysfunction, mild enlargement of the right ventricle mild dilation of left atrial and right atrial size, mild mitral regurg, no  overt pulmonary hypertension. -changed solumedrol 40 IV daily to prednisone PO 20mg  today     Chronic kidney disease stage III (baseline creatinine 1.3-1.5) Trend BMP  Replace electrolytes as indicated  Monitor UOP Avoid nephrotoxic medications   Acute on chronic anemia  VTE px: subq lovenox Trend CBC  Monitor for s/sx of bleeding and transfuse for hgb <7 Transfused 1 unit yesterday Aztreonam discontinued yesterday due to side effect of anemia  GERD  Continue protonix    Critical care provider statement:    Critical care time (minutes):  33   Critical care time was exclusive of:  Separately billable procedures and  treating other patients   Critical care was necessary to treat or prevent imminent or  life-threatening deterioration of the following conditions: acute hypoxemic respiratory failure, post covid pneumonitis, ckd, multiple comorbid conditions.    Critical care was time spent personally by me on the following  activities:  Development of treatment plan with patient or surrogate,  discussions with consultants, evaluation of  patient's response to  treatment, examination of patient, obtaining history from patient or  surrogate, ordering and performing treatments and interventions, ordering  and review of laboratory studies and re-evaluation of patient's condition   I assumed direction of critical care for this patient from another  provider in my specialty: no      This note was dictated using voice recognition software/Dragon.  Despite best efforts to proofread, errors can occur which can change the meaning.  Any change was purely unintentional.

## 2019-08-16 NOTE — Progress Notes (Signed)
Patient ID: Ryan Pittman, male   DOB: 01-19-1941, 78 y.o.   MRN: [id number] [facility] PROGRESS NOTE  Ryan Pittman [id number] DOB: 01-May-1941 DOA: 08/11/2019 PCP: Tonia Ghent, MD  HPI/Subjective: Patient feels okay.  He states that he desaturates with limited movement and coughing.  Patient currently breathing okay on 93% oxygen on high flow nasal cannula.  Feeling weak.  Objective: Vitals:   08/16/19 1200 08/16/19 1300  BP: (!) 140/58   Pulse: 98 71  Resp: 16 19  Temp: 98.4 F (36.9 C)   SpO2: 95% 100%    Intake/Output Summary (Last 24 hours) at 08/16/2019 1315 Last data filed at 08/16/2019 1200 Gross per 24 hour  Intake 1562.95 ml  Output 1875 ml  Net -312.05 ml   Filed Weights   08/11/19 0958  Weight: 68 kg    ROS: Review of Systems  Constitutional: Negative for chills and fever.  Eyes: Negative for blurred vision.  Respiratory: Positive for shortness of breath. Negative for cough.   Cardiovascular: Negative for chest pain.  Gastrointestinal: Negative for abdominal pain, constipation, diarrhea, nausea and vomiting.  Genitourinary: Negative for dysuria.  Musculoskeletal: Negative for joint pain.  Neurological: Negative for dizziness and headaches.   Exam: Physical Exam  Constitutional: He is oriented to person, place, and time.  HENT:  Nose: No mucosal edema.  Mouth/Throat: No oropharyngeal exudate or posterior oropharyngeal edema.  Eyes: Pupils are equal, round, and reactive to light. Conjunctivae, EOM and lids are normal.  Neck: No JVD present. Carotid bruit is not present. No thyroid mass and no thyromegaly present.  Cardiovascular: S1 normal and S2 normal. Exam reveals no gallop.  No murmur heard. Pulses:      Dorsalis pedis pulses are 2+ on the right side and 2+ on the left side.  Respiratory: No respiratory distress. He has decreased breath sounds in the right lower field and the left lower field. He has no wheezes. He has no rhonchi.  He has no rales.  Coarse breath sounds bilaterally.  GI: Soft. Bowel sounds are normal. There is no abdominal tenderness.  Musculoskeletal:     Cervical back: No edema.     Right ankle: No swelling.     Left ankle: No swelling.  Lymphadenopathy:    He has no cervical adenopathy.  Neurological: He is alert and oriented to person, place, and time. No cranial nerve deficit.  Able to straight leg raise bilaterally.  Skin: Skin is warm. No rash noted. Nails show no clubbing.  Psychiatric: He has a normal mood and affect.      Data Reviewed: Basic Metabolic Panel: Recent Labs  Lab 08/12/19 0551 08/13/19 0414 08/14/19 0432 08/15/19 0741 08/16/19 0509  NA 141 139 140 137 138  K 4.1 4.2 4.1 3.9 4.2  CL 110 109 109 105 106  CO2 19* 21* 21* 23 24  GLUCOSE 211* 174* 169* 101* 88  BUN 26* 40* 48* 40* 32*  CREATININE 1.04 1.08 1.26* 1.09 1.15  CALCIUM 8.0* 7.9* 8.3* 8.1* 7.9*  MG 2.1 2.2 2.2 2.1 2.1  PHOS  --   --   --  2.9  --    Liver Function Tests: Recent Labs  Lab 08/11/19 1004 08/12/19 0551 08/13/19 0414 08/14/19 0432 08/15/19 0741  AST 27 20 17 19   --   ALT 12 12 12 15   --   ALKPHOS 49 53 51 55  --   BILITOT 0.4 0.6 0.4 0.9  --   PROT 5.2*  5.5* 5.1* 5.1*  --   ALBUMIN 1.9* 1.9* 1.7* 1.8* 1.8*   CBC: Recent Labs  Lab 08/12/19 0551 08/13/19 0414 08/13/19 1840 08/14/19 0432 08/15/19 0741 08/16/19 0509  WBC 13.8* 25.3*  --  29.1* 33.2* 37.2*  NEUTROABS 12.1* 22.3*  --  24.1* 24.5* 27.0*  HGB 7.0* 6.7* 9.3* 8.6* 9.3* 8.7*  HCT 22.6* 19.9* 28.1* 25.5* 28.7* 25.8*  MCV 90.8 85.8  --  85.0 88.9 85.1  PLT 465* 482*  --  556* 585* 517*    BNP (last 3 results) Recent Labs    07/15/19 0609 07/16/19 0225 08/11/19 1857  BNP 146.9* 105.2* 304.0*     CBG: Recent Labs  Lab 08/15/19 1148 08/15/19 1607 08/15/19 2224 08/16/19 0754 08/16/19 1148  GLUCAP 189* 216* 214* 113* 103*    Recent Results (from the past 240 hour(s))  SARS CORONAVIRUS 2 (TAT 6-24  HRS) Nasopharyngeal Nasopharyngeal Swab     Status: None   Collection Time: 08/11/19 10:00 AM   Specimen: Nasopharyngeal Swab  Result Value Ref Range Status   SARS Coronavirus 2 NEGATIVE NEGATIVE Final    Comment: (NOTE) SARS-CoV-2 target nucleic acids are NOT DETECTED. The SARS-CoV-2 RNA is generally detectable in upper and lower respiratory specimens during the acute phase of infection. Negative results do not preclude SARS-CoV-2 infection, do not rule out co-infections with other pathogens, and should not be used as the sole basis for treatment or other patient management decisions. Negative results must be combined with clinical observations, patient history, and epidemiological information. The expected result is Negative. Fact Sheet for Patients: SugarRoll.be Fact Sheet for Healthcare Providers: https://www.woods-mathews.com/ This test is not yet approved or cleared by the [place] FDA and  has been authorized for detection and/or diagnosis of SARS-CoV-2 by FDA under an Emergency Use Authorization (EUA). This EUA will remain  in effect (meaning this test can be used) for the duration of the COVID-19 declaration under Section 56 4(b)(1) of the Act, 21 U.S.C. section 360bbb-3(b)(1), unless the authorization is terminated or revoked sooner. Performed at [facility], [place] [address] [place], [place] [address]   Blood Culture (routine x 2)     Status: None   Collection Time: 08/11/19 10:03 AM   Specimen: BLOOD  Result Value Ref Range Status   Specimen Description BLOOD LEFT HAND  Final   Special Requests   Final    BOTTLES DRAWN AEROBIC AND ANAEROBIC Blood Culture adequate volume   Culture   Final    NO GROWTH 5 DAYS Performed at [facility], [address], [place], [place] [address]    Report Status 08/16/2019 FINAL  Final  Blood Culture (routine x 2)     Status: None   Collection Time: 08/11/19 10:31 AM    Specimen: BLOOD  Result Value Ref Range Status   Specimen Description BLOOD BLOOD RIGHT FOREARM  Final   Special Requests   Final    BOTTLES DRAWN AEROBIC AND ANAEROBIC Blood Culture adequate volume   Culture   Final    NO GROWTH 5 DAYS Performed at [facility], [address], [place], [place] [address]    Report Status 08/16/2019 FINAL  Final  MRSA PCR Screening     Status: Abnormal   Collection Time: 08/12/19  2:04 AM   Specimen: Nasopharyngeal  Result Value Ref Range Status   MRSA by PCR POSITIVE (A) NEGATIVE Final    Comment:        The GeneXpert MRSA  Assay (FDA approved for NASAL specimens only), is one component of a comprehensive MRSA colonization surveillance program. It is not intended to diagnose MRSA infection nor to guide or monitor treatment for MRSA infections. RESULT CALLED TO, READ BACK BY AND VERIFIED WITH: Tia Masker RN [phone] 08/12/2019 HNM Performed at [facility], [address] [place], [place] [address]      Studies: [facility] Chest [place] 1 View  Result Date: 08/15/2019 CLINICAL DATA:  Respiratory failure with hypoxia. EXAM: PORTABLE CHEST 1 VIEW COMPARISON:  Radiograph 08/13/2019. CT 08/11/2019 FINDINGS: Post median sternotomy. Heart is normal in size. Unchanged appearance of the chest with diffuse fine reticular opacities in both lungs. No pleural fluid or pneumothorax. IMPRESSION: Unchanged appearance of the chest with diffuse fine reticular opacities in both lungs. This may represent pneumonia, pulmonary edema, or combination thereof. Electronically Signed   By: Keith Rake M.D.   On: 08/15/2019 03:54    Scheduled Meds: . amLODipine  2.5 mg Oral Daily  . Chlorhexidine Gluconate Cloth  6 each Topical Q0600  . cholecalciferol  2,000 Units Oral Daily  . clopidogrel  75 mg Oral Daily  . insulin aspart  0-15 Units Subcutaneous TID WC  . insulin aspart  0-5 Units Subcutaneous QHS  . ipratropium-albuterol  3 mL Inhalation Q4H   . levothyroxine  112.5 mcg Oral Once per day on Thu  . levothyroxine  75 mcg Oral Once per day on Sun Mon Tue Wed Fri Sat  . metoprolol tartrate  12.5 mg Oral BID  . multivitamin with minerals  1 tablet Oral Daily  . mupirocin ointment  1 application Nasal BID  . omega-3 acid ethyl esters  1 capsule Oral Daily  . pantoprazole  40 mg Oral BID  . polyethylene glycol  17 g Oral Daily  . pravastatin  40 mg Oral q1800  . [START ON 08/17/2019] predniSONE  20 mg Oral Q breakfast  . tamsulosin  0.4 mg Oral Daily  . vitamin C  500 mg Oral Daily   Continuous Infusions: . sulfamethoxazole-trimethoprim Stopped (08/16/19 0720)    Assessment/Plan:  1. Acute hypoxic respiratory failure.  Patient currently requiring 93% oxygen on high flow nasal cannula.  I explained that he is in the hospital while he is on high flow nasal cannula and that he can go home with high flow nasal cannula.  The patient's wife is very concerned about him.  I was able to show her the CAT scan results of her husband.  Shows moderate bilateral multifocal airspace processes which can be consistent with multifocal pneumonia.  Since the patient had recent COVID-19 pneumonia, we need to watch him closely and try to taper the oxygen over time.  Patient is on low-dose prednisone. 2. Pneumonia, multifocal lobar, with leukocytosis.  Case discussed with critical care specialist and he changed antibiotics over to Bactrim with concern for PCP pneumonia. 3. Essential hypertension on low-dose metoprolol and Norvasc 4. Acute kidney injury on chronic kidney disease stage II.  Creatinine has now improved to 1.15 with GFR of 60. 5. Paroxysmal atrial fibrillation on metoprolol.  Amiodarone on hold with abnormal CT scan of the chest. 6. Iron deficiency anemia.  Received 1 unit of packed red blood cells and iron during the hospital course 7. Hypothyroidism on levothyroxine  Code Status:     Code Status Orders  (From admission, onward)          Start     Ordered   08/11/19 1816  Full code  Continuous     08/11/19 1815        Code Status History    Date Active Date Inactive Code Status Order ID Comments User Context   07/10/2019 0126 07/19/2019 1808 Full Code [id number]  Vianne Bulls, MD ED   06/26/2019 1116 06/26/2019 1747 Full Code [id number]  [name], MD Inpatient   07/11/2018 1302 07/18/2018 1413 Full Code [id number]  [name], PA-C Inpatient   06/24/2018 1112 06/24/2018 1722 Full Code [id number]  [name], MD Inpatient   Advance Care Planning Activity     Family Communication: Spoke with the patient's wife at the bedside and outside the room and tried to answer as many questions as I could to the best of my ability. Disposition Plan: To be determined  Consultants:  Critical care specialist  Antibiotics:  Bactrim  Time spent: 32 minutes  [place]

## 2019-08-16 NOTE — Plan of Care (Signed)
Pt able to reposition in bed, adequate PO intake, BM yesterday, uses urinal w/o assist.  O2 requirements remain very high and he becomes SOB w/minimal exertion.  Wife at bedside this shift.  Pt verbalizes he hopes he can "get better and go home."

## 2019-08-17 DIAGNOSIS — N400 Enlarged prostate without lower urinary tract symptoms: Secondary | ICD-10-CM

## 2019-08-17 LAB — GLUCOSE, CAPILLARY
Glucose-Capillary: 86 mg/dL (ref 70–99)
Glucose-Capillary: 112 mg/dL — ABNORMAL HIGH (ref 70–99)
Glucose-Capillary: 196 mg/dL — ABNORMAL HIGH (ref 70–99)
Glucose-Capillary: 144 mg/dL — ABNORMAL HIGH (ref 70–99)

## 2019-08-17 MED ORDER — ROPINIROLE HCL 0.25 MG PO TABS
0.2500 mg | ORAL_TABLET | Freq: Every day | ORAL | Status: DC
  Administered 2019-08-17 – 2019-08-27 (×11): 0.25 mg via ORAL
  Filled 2019-08-17 (×13): qty 1

## 2019-08-17 MED ORDER — ALPRAZOLAM 0.25 MG PO TABS
0.2500 mg | ORAL_TABLET | Freq: Three times a day (TID) | ORAL | Status: DC | PRN
  Administered 2019-08-17 – 2019-08-26 (×8): 0.25 mg via ORAL
  Filled 2019-08-17 (×8): qty 1

## 2019-08-17 NOTE — Progress Notes (Signed)
Name: Ryan Pittman MRN: [id number] DOB: Jul 22, 1941    ADMISSION DATE:  08/11/2019 CONSULTATION DATE: 08/11/2019  REFERRING MD : Dr. Blaine Hamper   FOLLOW-UP: Shortness of breath/hypoxic respiratory failure  BRIEF PATIENT DESCRIPTION:  78 yo male discharged from [facility] 07/19/19 following treatment of COVID-19 currently admitted with acute hypoxic respiratory failure secondary to multifocal pneumonia vs. pneumonitis and pulmonary edema requiring HFNC   SIGNIFICANT EVENTS/STUDIES:  12/7: CTA Chest revealed no evidence of pulmonary embolism. Moderate bilateral multifocal airspace process with small amount of bilateral pleural fluid. Findings are likely due to multifocal pneumonia. Aortic Atherosclerosis (ICD10-I70.0) and Emphysema (ICD10-J43.9). Atherosclerotic coronary artery disease. Several hepatic cysts with the largest over the left lobe measuring 4 cm. 12/7: Pt admitted to the stepdown unit on HFNC  12/9: Received transfusion of 1 unit of PRBCs 12/10: Lasix x1, step up pulmonary toilet 12/11-patient continues to require 40 L/min 90% high flow nasal cannula with failure to wean.  Discussed with wife at bedside today 12/12 - patient remains on HFNC unable to wean, he is using IS able to take 778ml tidal volume, in no distress while at rest. Uses acapella device brings up small amt of phlegm. He may need LTAC I anticipate slow wean from O2. 12/13 -significant improvement in breathing with decreased FiO2 to 50% from 90% high flow yesterday.    CULTURES: COVID-19 12/7>>negative  POC COVID-19 12/7>>negative  MRSA PCR 12/7>> Blood x2 12/7>>  ANTIMICROBIALS: Vancomycin 12/7>> Aztreonam 12/7>> 12/9 Levaquin>> Bactrim 08/15/2019  Background leading to hospitalization:   This is an 78 yo male with a PMH of PVD, Pneumonia, Former Tobacco Abuse, IgG Gammopathy, HLD, HTN, Hypercalcemia, Dyspnea, CAD, Colon Polyps, CKD,Thyroid Cancer, Arthritis, Anginal Pain, Anemia, and GI Bleed  secondary to Celebrex.  He presented to [facility] ER via EMS on 12/7 with worsening shortness of breath at rest.  Pt discharged from [facility] 07/19/19 following treatment of COVID-19 (while hospitalized received remdesivir/levaquin/iv steroids).  Pt discharged on home O2 @3L , prednisone taper, vitamin C, and vitamin D.  During current ER visit COVID-19 negative, however CXR concerning for atypical infection.  CTA Chest negative for pulmonary embolism, but revealed multifocal pneumonia.  Lab results revealed CO2 20, BNP 304, crp 25.0, pct 0.19, wbc 14.8, hgb 7.5, fibrinogen >750, and d-dimer 1,111.73.  Due to severe hypoxia pt placed on HFNC.  He received aztreonam, vancomycin, and solumedrol.  He was subsequently admitted to the stepdown unit per hospitalist team and PCCM consulted for additional management.  PAST MEDICAL HISTORY :    Prior to Admission medications   Medication Sig Start Date End Date Taking? Authorizing Provider  acetaminophen (TYLENOL) 500 MG tablet Take 1,000 mg by mouth every 6 (six) hours as needed for mild pain.   Yes [provider]  amiodarone (PACERONE) 200 MG tablet TAKE 1 TABLET BY MOUTH DAILY Patient taking differently: Take 200 mg by mouth daily.  02/24/19  Yes [name], MD  amLODipine (NORVASC) 2.5 MG tablet TAKE ONE TABLET BY MOUTH EVERY DAY Patient taking differently: Take 2.5 mg by mouth daily.  04/25/19  Yes Tonia Ghent, MD  benzonatate (TESSALON) 200 MG capsule Take 1 capsule (200 mg total) by mouth 3 (three) times daily as needed for cough. 07/07/19  Yes Tonia Ghent, MD  Cholecalciferol (VITAMIN D) 50 MCG (2000 UT) tablet Take 2,000 Units by mouth daily.   Yes [provider]  clopidogrel (PLAVIX) 75 MG tablet TAKE ONE TABLET EVERY DAY Patient taking  differently: Take 75 mg by mouth daily.  10/04/18  Yes Tonia Ghent, MD  Co-Enzyme Q-10 100 MG CAPS Take 100 mg by mouth daily.    Yes [provider]  Evolocumab  (REPATHA SURECLICK) [id number] MG/ML SOAJ Inject 140 mg into the skin every 14 (fourteen) days. 02/17/19  Yes [name], MD  ferrous sulfate 325 (65 FE) MG tablet Take 1 tablet (325 mg total) by mouth daily with breakfast. 08/07/19  Yes Tonia Ghent, MD  levothyroxine (SYNTHROID, LEVOTHROID) 75 MCG tablet Take 75 mcg by mouth See admin instructions. Take 1 tablet (75mcg) by mouth every Monday, Tuesday, Wednesday, Friday, Saturday and Sunday morning and take 1 tablets (112.104mcg) by mouth every Thursday morning   Yes [provider]  metoprolol tartrate (LOPRESSOR) 25 MG tablet Take 0.5 tablets (12.5 mg total) by mouth 2 (two) times daily. 09/05/18  Yes [name], MD  Multiple Vitamins-Minerals (MULTIVITAMIN WITH MINERALS) tablet Take 1 tablet by mouth daily. 07/19/19 07/18/20 Yes Bonnielee Haff, MD  Omega-3 Fatty Acids (FISH OIL OMEGA-3 PO) Take 1,400 Units by mouth daily.    Yes [provider]  pantoprazole (PROTONIX) 40 MG tablet Take 1 tablet (40 mg total) by mouth daily. 07/20/19 08/19/19 Yes Bonnielee Haff, MD  polyethylene glycol (MIRALAX / GLYCOLAX) 17 g packet Take 17 g by mouth daily. 07/19/19  Yes Bonnielee Haff, MD  pravastatin (PRAVACHOL) 40 MG tablet TAKE ONE TABLET BY MOUTH EVERY DAY Patient taking differently: Take 40 mg by mouth daily.  06/10/19  Yes Tonia Ghent, MD  tamsulosin (FLOMAX) 0.4 MG CAPS capsule Take 1 capsule (0.4 mg total) by mouth daily. 11/20/18  Yes Stoioff, Ronda Fairly, MD  traZODone (DESYREL) 100 MG tablet Take 50-100 mg by mouth at bedtime as needed for sleep.  06/30/19  Yes [provider]  vitamin C (ASCORBIC ACID) 500 MG tablet Take 500 mg by mouth daily.   Yes [provider]   Allergies  Allergen Reactions  . Penicillins Itching and Other (See Comments)    PATIENT HAS HAD A PCN REACTION WITH IMMEDIATE RASH, FACIAL/TONGUE/THROAT SWELLING, SOB, OR LIGHTHEADEDNESS WITH HYPOTENSION:  #  #  YES  #  #  Has patient had  a PCN reaction causing severe rash involving mucus membranes or skin necrosis: No Has patient had a PCN reaction that required hospitalization: No Has patient had a PCN reaction occurring within the last 10 years: No If all of the above answers are "NO", then may proceed with Cephalosporin use.   . Ace Inhibitors Cough  . Aspirin Other (See Comments)    H/o GI bleed  . Celebrex [Celecoxib] Other (See Comments)    GI bleed  . Lipitor [Atorvastatin] Other (See Comments)    myalgias   Current medications were reviewed with Pharm.D.  REVIEW OF SYSTEMS: Positives in BOLD  Constitutional: Negative for fever, chills, weight loss, malaise/fatigue and diaphoresis.  HENT: Negative for hearing loss, ear pain, nosebleeds, congestion, sore throat, neck pain, tinnitus and ear discharge.   Eyes: Negative for blurred vision, double vision, photophobia, pain, discharge and redness.  Respiratory: cough, hemoptysis, sputum production, shortness of breath, wheezing and stridor.   Cardiovascular: Negative for chest pain, palpitations, orthopnea, claudication, leg swelling and PND.  Gastrointestinal: Negative for heartburn, nausea, vomiting, abdominal pain, diarrhea, constipation, blood in stool and melena.  Genitourinary: Negative for dysuria, urgency, frequency, hematuria and flank pain.  Musculoskeletal: Negative for myalgias, back pain, joint pain and falls.  Skin: Negative  for itching and rash.  Neurological: Negative for dizziness, tingling, tremors, sensory change, speech change, focal weakness, seizures, loss of consciousness, weakness and headaches.  Endo/Heme/Allergies: Negative for environmental allergies and polydipsia. Does not bruise/bleed easily.  SUBJECTIVE:  No distress, calm,  VITAL SIGNS: Temp:  [97.9 F (36.6 C)-98.9 F (37.2 C)] 98 F (36.7 C) (12/13 1200) Pulse Rate:  [69-94] 83 (12/13 1200) Resp:  [16-29] 22 (12/13 1400) BP: (116-142)/(51-61) 120/56 (12/13 1200) SpO2:  [87  %-100 %] 93 % (12/13 1456) FiO2 (%):  [50 %-94 %] 50 % (12/13 1456)  PHYSICAL EXAMINATION: General: acutely ill appearing male, NAD on HFNC  Neuro: alert and oriented, follows commands  HEENT: supple, no JVD Cardiovascular: nsr, rrr, no R/G  Lungs: Good air entry, coarse, even, non labored, no wheezes Abdomen: +BS x4, soft, non tender, non distended  Musculoskeletal: normal bulk and tone, no edema  Skin: intact no rashes or lesions present   Recent Labs  Lab 08/14/19 0432 08/15/19 0741 08/16/19 0509  NA 140 137 138  K 4.1 3.9 4.2  CL 109 105 106  CO2 21* 23 24  BUN 48* 40* 32*  CREATININE 1.26* 1.09 1.15  GLUCOSE 169* 101* 88   Recent Labs  Lab 08/14/19 0432 08/15/19 0741 08/16/19 0509  HGB 8.6* 9.3* 8.7*  HCT 25.5* 28.7* 25.8*  WBC 29.1* 33.2* 37.2*  PLT 556* 585* 517*   No results found.  ASSESSMENT / PLAN:  Acute hypoxic respiratory failure  - Patient initially dcd from [place] on 4L/min [place]   - he has been diuresed with net >4L negative fluid balance  - has been on steroids since [place] admission  - currently on empiric abx with levaquin and vancomycin  - query PCP pneumonia   - will obtain fungitell and place on bactrim empirically -will do PO to keep fluid infusion minimal Continue vitamin C and D Echo: LVH, elevated left atrial pressure, diastolic dysfunction, mild enlargement of the right ventricle mild dilation of left atrial and right atrial size, mild mitral regurg, no overt pulmonary hypertension. -changed solumedrol 40 IV daily to prednisone PO 20mg  today     Chronic kidney disease stage III (baseline creatinine 1.3-1.5) Trend BMP  Replace electrolytes as indicated  Monitor UOP Avoid nephrotoxic medications   Acute on chronic anemia  VTE px: subq lovenox Trend CBC  Monitor for s/sx of bleeding and transfuse for hgb <7 Transfused 1 unit yesterday Aztreonam discontinued yesterday due to side effect of anemia  GERD  Continue protonix    Critical  care provider statement:    Critical care time (minutes):  33   Critical care time was exclusive of:  Separately billable procedures and  treating other patients   Critical care was necessary to treat or prevent imminent or  life-threatening deterioration of the following conditions: acute hypoxemic respiratory failure, post covid pneumonitis, ckd, multiple comorbid conditions.    Critical care was time spent personally by me on the following  activities:  Development of treatment plan with patient or surrogate,  discussions with consultants, evaluation of patient's response to  treatment, examination of patient, obtaining history from patient or  surrogate, ordering and performing treatments and interventions, ordering  and review of laboratory studies and re-evaluation of patient's condition   I assumed direction of critical care for this patient from another  provider in my specialty: no      This note was dictated using voice recognition software/Dragon.  Despite best efforts to proofread, errors can  occur which can change the meaning.  Any change was purely unintentional.   Ottie Glazier, M.D.  [facility]

## 2019-08-17 NOTE — Progress Notes (Signed)
Patient ID: Ryan Pittman, male   DOB: 1941-06-29, 78 y.o.   MRN: [id number] [facility] PROGRESS NOTE  Ryan Pittman [id number] DOB: 1941/07/30 DOA: 08/11/2019 PCP: Tonia Ghent, MD  HPI/Subjective: Again the patient feels okay.  Some shortness of breath with moving around but not at rest.  Yesterday he was on 93% oxygen and now down to 73%.  He states that he is trying to do some leg exercises and using incentive spirometer and flutter valve.  Objective: Vitals:   08/17/19 0751 08/17/19 0916  BP:  (!) 119/52  Pulse:  88  Resp:  16  Temp:  98.9 F (37.2 C)  SpO2: 99% 94%    Intake/Output Summary (Last 24 hours) at 08/17/2019 1045 Last data filed at 08/17/2019 0848 Gross per 24 hour  Intake --  Output 1200 ml  Net -1200 ml   Filed Weights   08/11/19 0958  Weight: 68 kg    ROS: Review of Systems  Constitutional: Negative for chills and fever.  Eyes: Negative for blurred vision.  Respiratory: Positive for shortness of breath. Negative for cough.   Cardiovascular: Negative for chest pain.  Gastrointestinal: Negative for abdominal pain, constipation, diarrhea, nausea and vomiting.  Genitourinary: Negative for dysuria.  Musculoskeletal: Negative for joint pain.  Neurological: Negative for dizziness and headaches.   Exam: Physical Exam  Constitutional: He is oriented to person, place, and time.  HENT:  Nose: No mucosal edema.  Mouth/Throat: No oropharyngeal exudate or posterior oropharyngeal edema.  Eyes: Pupils are equal, round, and reactive to light. Conjunctivae, EOM and lids are normal.  Neck: No JVD present. Carotid bruit is not present. No thyroid mass and no thyromegaly present.  Cardiovascular: S1 normal and S2 normal. Exam reveals no gallop.  No murmur heard. Pulses:      Dorsalis pedis pulses are 2+ on the right side and 2+ on the left side.  Respiratory: No respiratory distress. He has decreased breath sounds in the right lower field and the  left lower field. He has no wheezes. He has no rhonchi. He has no rales.  GI: Soft. Bowel sounds are normal. There is no abdominal tenderness.  Musculoskeletal:     Cervical back: No edema.     Right ankle: No swelling.     Left ankle: No swelling.  Lymphadenopathy:    He has no cervical adenopathy.  Neurological: He is alert and oriented to person, place, and time. No cranial nerve deficit.  Able to straight leg raise bilaterally.  Skin: Skin is warm. No rash noted. Nails show no clubbing.  Psychiatric: He has a normal mood and affect.      Data Reviewed: Basic Metabolic Panel: Recent Labs  Lab 08/12/19 0551 08/13/19 0414 08/14/19 0432 08/15/19 0741 08/16/19 0509  NA 141 139 140 137 138  K 4.1 4.2 4.1 3.9 4.2  CL 110 109 109 105 106  CO2 19* 21* 21* 23 24  GLUCOSE 211* 174* 169* 101* 88  BUN 26* 40* 48* 40* 32*  CREATININE 1.04 1.08 1.26* 1.09 1.15  CALCIUM 8.0* 7.9* 8.3* 8.1* 7.9*  MG 2.1 2.2 2.2 2.1 2.1  PHOS  --   --   --  2.9  --    Liver Function Tests: Recent Labs  Lab 08/11/19 1004 08/12/19 0551 08/13/19 0414 08/14/19 0432 08/15/19 0741  AST 27 20 17 19   --   ALT 12 12 12 15   --   ALKPHOS 49 53 51 55  --  BILITOT 0.4 0.6 0.4 0.9  --   PROT 5.2* 5.5* 5.1* 5.1*  --   ALBUMIN 1.9* 1.9* 1.7* 1.8* 1.8*   CBC: Recent Labs  Lab 08/12/19 0551 08/13/19 0414 08/13/19 1840 08/14/19 0432 08/15/19 0741 08/16/19 0509  WBC 13.8* 25.3*  --  29.1* 33.2* 37.2*  NEUTROABS 12.1* 22.3*  --  24.1* 24.5* 27.0*  HGB 7.0* 6.7* 9.3* 8.6* 9.3* 8.7*  HCT 22.6* 19.9* 28.1* 25.5* 28.7* 25.8*  MCV 90.8 85.8  --  85.0 88.9 85.1  PLT 465* 482*  --  556* 585* 517*    BNP (last 3 results) Recent Labs    07/15/19 0609 07/16/19 0225 08/11/19 1857  BNP 146.9* 105.2* 304.0*     CBG: Recent Labs  Lab 08/16/19 0754 08/16/19 1148 08/16/19 1607 08/16/19 2212 08/17/19 0725  GLUCAP 113* 103* 227* 204* 86    Recent Results (from the past 240 hour(s))  SARS  CORONAVIRUS 2 (TAT 6-24 HRS) Nasopharyngeal Nasopharyngeal Swab     Status: None   Collection Time: 08/11/19 10:00 AM   Specimen: Nasopharyngeal Swab  Result Value Ref Range Status   SARS Coronavirus 2 NEGATIVE NEGATIVE Final    Comment: (NOTE) SARS-CoV-2 target nucleic acids are NOT DETECTED. The SARS-CoV-2 RNA is generally detectable in upper and lower respiratory specimens during the acute phase of infection. Negative results do not preclude SARS-CoV-2 infection, do not rule out co-infections with other pathogens, and should not be used as the sole basis for treatment or other patient management decisions. Negative results must be combined with clinical observations, patient history, and epidemiological information. The expected result is Negative. Fact Sheet for Patients: SugarRoll.be Fact Sheet for Healthcare Providers: https://www.woods-mathews.com/ This test is not yet approved or cleared by the [place] FDA and  has been authorized for detection and/or diagnosis of SARS-CoV-2 by FDA under an Emergency Use Authorization (EUA). This EUA will remain  in effect (meaning this test can be used) for the duration of the COVID-19 declaration under Section 56 4(b)(1) of the Act, 21 U.S.C. section 360bbb-3(b)(1), unless the authorization is terminated or revoked sooner. Performed at [facility], [place] [address], [place], [place] [address]   Blood Culture (routine x 2)     Status: None   Collection Time: 08/11/19 10:03 AM   Specimen: BLOOD  Result Value Ref Range Status   Specimen Description BLOOD LEFT HAND  Final   Special Requests   Final    BOTTLES DRAWN AEROBIC AND ANAEROBIC Blood Culture adequate volume   Culture   Final    NO GROWTH 5 DAYS Performed at [facility], [address] [place], [place] [address]    Report Status 08/16/2019 FINAL  Final  Blood Culture (routine x 2)     Status: None   Collection Time:  08/11/19 10:31 AM   Specimen: BLOOD  Result Value Ref Range Status   Specimen Description BLOOD BLOOD RIGHT FOREARM  Final   Special Requests   Final    BOTTLES DRAWN AEROBIC AND ANAEROBIC Blood Culture adequate volume   Culture   Final    NO GROWTH 5 DAYS Performed at [facility], [address], [place], [place] [address]    Report Status 08/16/2019 FINAL  Final  MRSA PCR Screening     Status: Abnormal   Collection Time: 08/12/19  2:04 AM   Specimen: Nasopharyngeal  Result Value Ref Range Status   MRSA by PCR POSITIVE (A) NEGATIVE Final  Comment:        The GeneXpert MRSA Assay (FDA approved for NASAL specimens only), is one component of a comprehensive MRSA colonization surveillance program. It is not intended to diagnose MRSA infection nor to guide or monitor treatment for MRSA infections. RESULT CALLED TO, READ BACK BY AND VERIFIED WITH: Tia Masker RN [phone] 08/12/2019 HNM Performed at [facility], [address] [place], [place] [address]       Scheduled Meds: . amLODipine  2.5 mg Oral Daily  . Chlorhexidine Gluconate Cloth  6 each Topical Q0600  . cholecalciferol  2,000 Units Oral Daily  . clopidogrel  75 mg Oral Daily  . insulin aspart  0-15 Units Subcutaneous TID WC  . insulin aspart  0-5 Units Subcutaneous QHS  . ipratropium-albuterol  3 mL Inhalation Q4H  . levothyroxine  112.5 mcg Oral Once per day on Thu  . levothyroxine  75 mcg Oral Once per day on Sun Mon Tue Wed Fri Sat  . metoprolol tartrate  12.5 mg Oral BID  . multivitamin with minerals  1 tablet Oral Daily  . mupirocin ointment  1 application Nasal BID  . omega-3 acid ethyl esters  1 capsule Oral Daily  . pantoprazole  40 mg Oral BID  . polyethylene glycol  17 g Oral Daily  . pravastatin  40 mg Oral q1800  . predniSONE  20 mg Oral Q breakfast  . sulfamethoxazole-trimethoprim  2 tablet Oral TID  . tamsulosin  0.4 mg Oral Daily  . vitamin C  500 mg Oral Daily    Continuous Infusions:   Assessment/Plan:  1. Acute hypoxic respiratory failure.  Patient currently requiring 73% oxygen on high flow nasal cannula.  The patient will remain in the hospital while on high flow nasal cannula.  The patient is moving better air today so hopefully we can continue tapering the oxygen requirements down. 2. Pneumonia, multifocal lobar, with leukocytosis.  White blood cell count continuing to trend up.  Patient on Bactrim for suspected PCP. 3. Essential hypertension on low-dose metoprolol and Norvasc 4. Acute kidney injury on chronic kidney disease stage II.  Creatinine has now improved to 1.15 with GFR of 60. 5. Paroxysmal atrial fibrillation on metoprolol.  Amiodarone on hold with abnormal CT scan of the chest. 6. Iron deficiency anemia.  Received 1 unit of packed red blood cells and iron during the hospital course 7. Hypothyroidism on levothyroxine 8. BPH on Flomax 9. Weakness.  We will get physical therapy evaluation.  Code Status:     Code Status Orders  (From admission, onward)         Start     Ordered   08/11/19 1816  Full code  Continuous     08/11/19 1815        Code Status History    Date Active Date Inactive Code Status Order ID Comments User Context   07/10/2019 0126 07/19/2019 1808 Full Code [id number]  Vianne Bulls, MD ED   06/26/2019 1116 06/26/2019 1747 Full Code [id number]  [name], MD Inpatient   07/11/2018 1302 07/18/2018 1413 Full Code [id number]  [name], PA-C Inpatient   06/24/2018 1112 06/24/2018 1722 Full Code [id number]  [name], MD Inpatient   Advance Care Planning Activity     Family Communication: Spoke with the patient's wife at the bedside Disposition Plan: We will need to be off high flow nasal cannula before any disposition.  Consultants:  Critical care specialist  Antibiotics:  Bactrim  Time spent: 27 minutes  [place]

## 2019-08-18 DIAGNOSIS — N1831 Chronic kidney disease, stage 3a: Secondary | ICD-10-CM

## 2019-08-18 DIAGNOSIS — N138 Other obstructive and reflux uropathy: Secondary | ICD-10-CM

## 2019-08-18 DIAGNOSIS — J069 Acute upper respiratory infection, unspecified: Secondary | ICD-10-CM

## 2019-08-18 DIAGNOSIS — N401 Enlarged prostate with lower urinary tract symptoms: Secondary | ICD-10-CM

## 2019-08-18 DIAGNOSIS — G2581 Restless legs syndrome: Secondary | ICD-10-CM

## 2019-08-18 DIAGNOSIS — U071 COVID-19: Secondary | ICD-10-CM

## 2019-08-18 LAB — BASIC METABOLIC PANEL
Anion gap: 10 (ref 5–15)
BUN: 30 mg/dL — ABNORMAL HIGH (ref 8–23)
CO2: 21 mmol/L — ABNORMAL LOW (ref 22–32)
Calcium: 8.3 mg/dL — ABNORMAL LOW (ref 8.9–10.3)
Chloride: 104 mmol/L (ref 98–111)
Creatinine, Ser: 1.36 mg/dL — ABNORMAL HIGH (ref 0.61–1.24)
GFR calc Af Amer: 57 mL/min — ABNORMAL LOW (ref >60)
GFR calc non Af Amer: 49 mL/min — ABNORMAL LOW (ref >60)
Glucose, Bld: 75 mg/dL (ref 70–99)
Potassium: 4.7 mmol/L (ref 3.5–5.1)
Sodium: 135 mmol/L (ref 135–145)

## 2019-08-18 LAB — CBC
HCT: 29.2 % — ABNORMAL LOW (ref 39.0–52.0)
Hemoglobin: 9.7 g/dL — ABNORMAL LOW (ref 13.0–17.0)
MCH: 28.8 pg (ref 26.0–34.0)
MCHC: 33.2 g/dL (ref 30.0–36.0)
MCV: 86.6 fL (ref 80.0–100.0)
Platelets: 472 10*3/uL — ABNORMAL HIGH (ref 150–400)
RBC: 3.37 MIL/uL — ABNORMAL LOW (ref 4.22–5.81)
RDW: 19.6 % — ABNORMAL HIGH (ref 11.5–15.5)
WBC: 33.6 10*3/uL — ABNORMAL HIGH (ref 4.0–10.5)
nRBC: 0.2 % (ref 0.0–0.2)

## 2019-08-18 LAB — GLUCOSE, CAPILLARY
Glucose-Capillary: 68 mg/dL — ABNORMAL LOW (ref 70–99)
Glucose-Capillary: 115 mg/dL — ABNORMAL HIGH (ref 70–99)
Glucose-Capillary: 149 mg/dL — ABNORMAL HIGH (ref 70–99)
Glucose-Capillary: 152 mg/dL — ABNORMAL HIGH (ref 70–99)

## 2019-08-18 LAB — FUNGITELL, SERUM
Fungitell Result: 132 pg/mL — ABNORMAL HIGH (ref <80)
Fungitell Result: 112 pg/mL — ABNORMAL HIGH (ref <80)

## 2019-08-18 LAB — PATHOLOGIST SMEAR REVIEW

## 2019-08-18 MED ORDER — FINASTERIDE 5 MG PO TABS
5.0000 mg | ORAL_TABLET | Freq: Every day | ORAL | Status: DC
  Administered 2019-08-18 – 2019-08-28 (×11): 5 mg via ORAL
  Filled 2019-08-18 (×11): qty 1

## 2019-08-18 MED ORDER — CHLORHEXIDINE GLUCONATE CLOTH 2 % EX PADS
6.0000 | MEDICATED_PAD | Freq: Every day | CUTANEOUS | Status: DC
  Administered 2019-08-18 – 2019-08-27 (×8): 6 via TOPICAL

## 2019-08-18 NOTE — Progress Notes (Signed)
PT Cancellation Note  Patient Details Name: ORVIL MONTAGUE MRN: [id number] DOB: [birth year]   Cancelled Treatment:    Reason Eval/Treat Not Completed: Patient not medically ready.  PT consult received.  Chart reviewed.  Nurse recommending holding PT at this time d/t respiratory concerns (pt desaturated with taking his pills this morning).  Will re-attempt PT evaluation at a later date/time.  Leitha Bleak, PT 08/18/19, 9:40 AM

## 2019-08-18 NOTE — Progress Notes (Signed)
Name: Ryan Pittman MRN: [id number] DOB: [birth year]    ADMISSION DATE:  08/11/2019 CONSULTATION DATE: 08/11/2019  REFERRING MD : Dr. Blaine Hamper   FOLLOW-UP: resp failure  BRIEF PATIENT DESCRIPTION:  78 yo male discharged from [facility] 07/19/19 following treatment of COVID-19 currently admitted with acute hypoxic respiratory failure secondary to multifocal pneumonia vs. pneumonitis and pulmonary edema requiring HFNC    CC follow up RESP failure  HPI Severe hypoxia Alert and awake Prognosis is poor Remains on High flow [employer]    SIGNIFICANT EVENTS/STUDIES:  12/7: CTA Chest revealed no evidence of pulmonary embolism. Moderate bilateral multifocal airspace process with small amount of bilateral pleural fluid. Findings are likely due to multifocal pneumonia. Aortic Atherosclerosis (ICD10-I70.0) and Emphysema (ICD10-J43.9). Atherosclerotic coronary artery disease. Several hepatic cysts with the largest over the left lobe measuring 4 cm. 12/7: Pt admitted to the stepdown unit on HFNC  12/9: Received transfusion of 1 unit of PRBCs 12/10: Lasix x1, step up pulmonary toilet 12/11-patient continues to require 40 L/min 90% high flow nasal cannula with failure to wean.  Discussed with wife at bedside today 12/12 - patient remains on HFNC unable to wean, he is using IS able to take 768ml tidal volume, in no distress while at rest. Uses acapella device brings up small amt of phlegm. He may need LTAC I anticipate slow wean from O2. 12/13 -significant improvement in breathing with decreased FiO2 to 50% from 90% high flow yesterday. 12/14 severe hypoxia   CULTURES: COVID-19 12/7>>negative  POC COVID-19 12/7>>negative  MRSA PCR 12/7>> Blood x2 12/7>>  ANTIMICROBIALS: Vancomycin 12/7>> Aztreonam 12/7>> 12/9 Levaquin>> Bactrim 08/15/2019  Background leading to hospitalization:   This is an 78 yo male with a PMH of PVD, Pneumonia, Former Tobacco Abuse, IgG Gammopathy, HLD, HTN,  Hypercalcemia, Dyspnea, CAD, Colon Polyps, CKD,Thyroid Cancer, Arthritis, Anginal Pain, Anemia, and GI Bleed secondary to Celebrex.  He presented to [facility] ER via EMS on 12/7 with worsening shortness of breath at rest.  Pt discharged from [facility] 07/19/19 following treatment of COVID-19 (while hospitalized received remdesivir/levaquin/iv steroids).  Pt discharged on home O2 @3L , prednisone taper, vitamin C, and vitamin D.  During current ER visit COVID-19 negative, however CXR concerning for atypical infection.  CTA Chest negative for pulmonary embolism, but revealed multifocal pneumonia.  Lab results revealed CO2 20, BNP 304, crp 25.0, pct 0.19, wbc 14.8, hgb 7.5, fibrinogen >750, and d-dimer 1,111.73.  Due to severe hypoxia pt placed on HFNC.  He received aztreonam, vancomycin, and solumedrol.  He was subsequently admitted to the stepdown unit per hospitalist team and PCCM consulted for additional management.  PAST MEDICAL HISTORY :    Prior to Admission medications   Medication Sig Start Date End Date Taking? Authorizing Provider  acetaminophen (TYLENOL) 500 MG tablet Take 1,000 mg by mouth every 6 (six) hours as needed for mild pain.   Yes [provider]  amiodarone (PACERONE) 200 MG tablet TAKE 1 TABLET BY MOUTH DAILY Patient taking differently: Take 200 mg by mouth daily.  02/24/19  Yes [name], MD  amLODipine (NORVASC) 2.5 MG tablet TAKE ONE TABLET BY MOUTH EVERY DAY Patient taking differently: Take 2.5 mg by mouth daily.  04/25/19  Yes Tonia Ghent, MD  benzonatate (TESSALON) 200 MG capsule Take 1 capsule (200 mg total) by mouth 3 (three) times daily as needed for cough. 07/07/19  Yes Tonia Ghent, MD  Cholecalciferol (VITAMIN D) 50 MCG (2000 UT) tablet Take 2,000  Units by mouth daily.   Yes [provider]  clopidogrel (PLAVIX) 75 MG tablet TAKE ONE TABLET EVERY DAY Patient taking differently: Take 75 mg by mouth daily.  10/04/18  Yes Tonia Ghent, MD  Co-Enzyme Q-10 100 MG CAPS Take 100 mg by mouth daily.    Yes [provider]  Evolocumab (REPATHA SURECLICK) [id number] MG/ML SOAJ Inject 140 mg into the skin every 14 (fourteen) days. 02/17/19  Yes [name], MD  ferrous sulfate 325 (65 FE) MG tablet Take 1 tablet (325 mg total) by mouth daily with breakfast. 08/07/19  Yes Tonia Ghent, MD  levothyroxine (SYNTHROID, LEVOTHROID) 75 MCG tablet Take 75 mcg by mouth See admin instructions. Take 1 tablet (64mcg) by mouth every Monday, Tuesday, Wednesday, Friday, Saturday and Sunday morning and take 1 tablets (112.74mcg) by mouth every Thursday morning   Yes [provider]  metoprolol tartrate (LOPRESSOR) 25 MG tablet Take 0.5 tablets (12.5 mg total) by mouth 2 (two) times daily. 09/05/18  Yes [name], MD  Multiple Vitamins-Minerals (MULTIVITAMIN WITH MINERALS) tablet Take 1 tablet by mouth daily. 07/19/19 07/18/20 Yes Bonnielee Haff, MD  Omega-3 Fatty Acids (FISH OIL OMEGA-3 PO) Take 1,400 Units by mouth daily.    Yes [provider]  pantoprazole (PROTONIX) 40 MG tablet Take 1 tablet (40 mg total) by mouth daily. 07/20/19 08/19/19 Yes Bonnielee Haff, MD  polyethylene glycol (MIRALAX / GLYCOLAX) 17 g packet Take 17 g by mouth daily. 07/19/19  Yes Bonnielee Haff, MD  pravastatin (PRAVACHOL) 40 MG tablet TAKE ONE TABLET BY MOUTH EVERY DAY Patient taking differently: Take 40 mg by mouth daily.  06/10/19  Yes Tonia Ghent, MD  tamsulosin (FLOMAX) 0.4 MG CAPS capsule Take 1 capsule (0.4 mg total) by mouth daily. 11/20/18  Yes Stoioff, Ronda Fairly, MD  traZODone (DESYREL) 100 MG tablet Take 50-100 mg by mouth at bedtime as needed for sleep.  06/30/19  Yes [provider]  vitamin C (ASCORBIC ACID) 500 MG tablet Take 500 mg by mouth daily.   Yes [provider]   Allergies  Allergen Reactions  . Penicillins Itching and Other (See Comments)    PATIENT HAS HAD A PCN REACTION WITH  IMMEDIATE RASH, FACIAL/TONGUE/THROAT SWELLING, SOB, OR LIGHTHEADEDNESS WITH HYPOTENSION:  #  #  YES  #  #  Has patient had a PCN reaction causing severe rash involving mucus membranes or skin necrosis: No Has patient had a PCN reaction that required hospitalization: No Has patient had a PCN reaction occurring within the last 10 years: No If all of the above answers are "NO", then may proceed with Cephalosporin use.   . Ace Inhibitors Cough  . Aspirin Other (See Comments)    H/o GI bleed  . Celebrex [Celecoxib] Other (See Comments)    GI bleed  . Lipitor [Atorvastatin] Other (See Comments)    myalgias   Current medications were reviewed with Pharm.D.   Review of Systems:  Gen:  Denies  fever, sweats, chills weight loss  HEENT: Denies blurred vision, double vision, ear pain, eye pain, hearing loss, nose bleeds, sore throat Cardiac:  No dizziness, chest pain or heaviness, chest tightness,edema, No JVD Resp:  +shortness of breath, Gi: Denies swallowing difficulty, stomach pain, nausea or vomiting, diarrhea, constipation, bowel incontinence Other:  All other systems negative    VITAL SIGNS: Temp:  [97.8 F (36.6 C)-98.9 F (37.2 C)] 98.2 F (36.8 C) (12/14 0526) Pulse Rate:  [62-118] 62 (12/14  0400) Resp:  [15-29] 15 (12/14 0400) BP: (112-143)/(47-73) 143/58 (12/14 0400) SpO2:  [78 %-100 %] 100 % (12/14 0731) FiO2 (%):  [50 %-70 %] 52 % (12/14 0731)  PHYSICAL EXAMINATION: General: acutely ill appearing male, NAD on HFNC  Neuro: alert and oriented, follows commands  HEENT: supple, no JVD Cardiovascular: nsr, rrr, no R/G  Lungs: Good air entry, coarse, even, non labored, no wheezes Abdomen: +BS x4, soft, non tender, non distended  Musculoskeletal: normal bulk and tone, no edema  Skin: intact no rashes or lesions present   Recent Labs  Lab 08/14/19 0432 08/15/19 0741 08/16/19 0509  NA 140 137 138  K 4.1 3.9 4.2  CL 109 105 106  CO2 21* 23 24  BUN 48* 40* 32*   CREATININE 1.26* 1.09 1.15  GLUCOSE 169* 101* 88   Recent Labs  Lab 08/14/19 0432 08/15/19 0741 08/16/19 0509  HGB 8.6* 9.3* 8.7*  HCT 25.5* 28.7* 25.8*  WBC 29.1* 33.2* 37.2*  PLT 556* 585* 517*   No results found.  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  [facility]  Medical Director [place] Director [facility] Cardio-Pulmonary Department

## 2019-08-18 NOTE — Progress Notes (Signed)
Patient uses flutter valve multiple times with moderate amount of phlegm. Patient is also transferred from bed to chair. Patient tolerated well. Will continue to monitor patient.

## 2019-08-18 NOTE — Progress Notes (Signed)
Patient ID: Ryan Pittman, male   DOB: Feb 09, 1941, 78 y.o.   MRN: [id number] [facility] PROGRESS NOTE  Ryan Pittman [id number] DOB: 07-09-1941 DOA: 08/11/2019 PCP: Tonia Ghent, MD  HPI/Subjective: Patient feeling okay.  Nocturnal shortness of breath.  Desaturates with movement.  Down to 53% FiO2 on high flow nasal cannula.  Objective: Vitals:   08/18/19 0800 08/18/19 1200  BP: (!) 119/55 137/67  Pulse: 87 78  Resp: (!) 22 (!) 21  Temp: 98.1 F (36.7 C) 98 F (36.7 C)  SpO2: 94% 94%    Intake/Output Summary (Last 24 hours) at 08/18/2019 1256 Last data filed at 08/18/2019 0800 Gross per 24 hour  Intake 240 ml  Output 1375 ml  Net -1135 ml   Filed Weights   08/11/19 0958  Weight: 68 kg    ROS: Review of Systems  Constitutional: Negative for chills and fever.  Eyes: Negative for blurred vision.  Respiratory: Positive for shortness of breath. Negative for cough.   Cardiovascular: Negative for chest pain.  Gastrointestinal: Negative for abdominal pain, constipation, diarrhea, nausea and vomiting.  Genitourinary: Negative for dysuria.  Musculoskeletal: Negative for joint pain.  Neurological: Negative for dizziness and headaches.   Exam: Physical Exam  Constitutional: He is oriented to person, place, and time.  HENT:  Nose: No mucosal edema.  Mouth/Throat: No oropharyngeal exudate or posterior oropharyngeal edema.  Eyes: Pupils are equal, round, and reactive to light. Conjunctivae, EOM and lids are normal.  Neck: No JVD present. Carotid bruit is not present. No thyroid mass and no thyromegaly present.  Cardiovascular: S1 normal and S2 normal. Exam reveals no gallop.  No murmur heard. Pulses:      Dorsalis pedis pulses are 2+ on the right side and 2+ on the left side.  Respiratory: No respiratory distress. He has decreased breath sounds in the right lower field and the left lower field. He has no wheezes. He has no rhonchi. He has no rales.  GI:  Soft. Bowel sounds are normal. There is no abdominal tenderness.  Musculoskeletal:     Cervical back: No edema.     Right ankle: No swelling.     Left ankle: No swelling.  Lymphadenopathy:    He has no cervical adenopathy.  Neurological: He is alert and oriented to person, place, and time. No cranial nerve deficit.  Able to straight leg raise bilaterally.  Skin: Skin is warm. No rash noted. Nails show no clubbing.  Psychiatric: He has a normal mood and affect.      Data Reviewed: Basic Metabolic Panel: Recent Labs  Lab 08/12/19 0551 08/13/19 0414 08/14/19 0432 08/15/19 0741 08/16/19 0509 08/18/19 0749  NA 141 139 140 137 138 135  K 4.1 4.2 4.1 3.9 4.2 4.7  CL 110 109 109 105 106 104  CO2 19* 21* 21* 23 24 21*  GLUCOSE 211* 174* 169* 101* 88 75  BUN 26* 40* 48* 40* 32* 30*  CREATININE 1.04 1.08 1.26* 1.09 1.15 1.36*  CALCIUM 8.0* 7.9* 8.3* 8.1* 7.9* 8.3*  MG 2.1 2.2 2.2 2.1 2.1  --   PHOS  --   --   --  2.9  --   --    Liver Function Tests: Recent Labs  Lab 08/12/19 0551 08/13/19 0414 08/14/19 0432 08/15/19 0741  AST 20 17 19   --   ALT 12 12 15   --   ALKPHOS 53 51 55  --   BILITOT 0.6 0.4 0.9  --  PROT 5.5* 5.1* 5.1*  --   ALBUMIN 1.9* 1.7* 1.8* 1.8*   CBC: Recent Labs  Lab 08/12/19 0551 08/13/19 0414 08/13/19 1840 08/14/19 0432 08/15/19 0741 08/16/19 0509 08/18/19 0749  WBC 13.8* 25.3*  --  29.1* 33.2* 37.2* 33.6*  NEUTROABS 12.1* 22.3*  --  24.1* 24.5* 27.0*  --   HGB 7.0* 6.7* 9.3* 8.6* 9.3* 8.7* 9.7*  HCT 22.6* 19.9* 28.1* 25.5* 28.7* 25.8* 29.2*  MCV 90.8 85.8  --  85.0 88.9 85.1 86.6  PLT 465* 482*  --  556* 585* 517* 472*    BNP (last 3 results) Recent Labs    07/15/19 0609 07/16/19 0225 08/11/19 1857  BNP 146.9* 105.2* 304.0*     CBG: Recent Labs  Lab 08/17/19 1125 08/17/19 1603 08/17/19 2219 08/18/19 0744 08/18/19 1212  GLUCAP 112* 196* 144* 68* 115*    Recent Results (from the past 240 hour(s))  SARS CORONAVIRUS 2  (TAT 6-24 HRS) Nasopharyngeal Nasopharyngeal Swab     Status: None   Collection Time: 08/11/19 10:00 AM   Specimen: Nasopharyngeal Swab  Result Value Ref Range Status   SARS Coronavirus 2 NEGATIVE NEGATIVE Final    Comment: (NOTE) SARS-CoV-2 target nucleic acids are NOT DETECTED. The SARS-CoV-2 RNA is generally detectable in upper and lower respiratory specimens during the acute phase of infection. Negative results do not preclude SARS-CoV-2 infection, do not rule out co-infections with other pathogens, and should not be used as the sole basis for treatment or other patient management decisions. Negative results must be combined with clinical observations, patient history, and epidemiological information. The expected result is Negative. Fact Sheet for Patients: SugarRoll.be Fact Sheet for Healthcare Providers: https://www.woods-mathews.com/ This test is not yet approved or cleared by the [place] FDA and  has been authorized for detection and/or diagnosis of SARS-CoV-2 by FDA under an Emergency Use Authorization (EUA). This EUA will remain  in effect (meaning this test can be used) for the duration of the COVID-19 declaration under Section 56 4(b)(1) of the Act, 21 U.S.C. section 360bbb-3(b)(1), unless the authorization is terminated or revoked sooner. Performed at [facility], [place] [address], [place], [place] [address]   Blood Culture (routine x 2)     Status: None   Collection Time: 08/11/19 10:03 AM   Specimen: BLOOD  Result Value Ref Range Status   Specimen Description BLOOD LEFT HAND  Final   Special Requests   Final    BOTTLES DRAWN AEROBIC AND ANAEROBIC Blood Culture adequate volume   Culture   Final    NO GROWTH 5 DAYS Performed at [facility], [address], [place], [place] [address]    Report Status 08/16/2019 FINAL  Final  Blood Culture (routine x 2)     Status: None   Collection Time: 08/11/19  10:31 AM   Specimen: BLOOD  Result Value Ref Range Status   Specimen Description BLOOD BLOOD RIGHT FOREARM  Final   Special Requests   Final    BOTTLES DRAWN AEROBIC AND ANAEROBIC Blood Culture adequate volume   Culture   Final    NO GROWTH 5 DAYS Performed at [facility], [address] [place], [place] [address]    Report Status 08/16/2019 FINAL  Final  MRSA PCR Screening     Status: Abnormal   Collection Time: 08/12/19  2:04 AM   Specimen: Nasopharyngeal  Result Value Ref Range Status   MRSA by PCR POSITIVE (A) NEGATIVE Final    Comment:  The GeneXpert MRSA Assay (FDA approved for NASAL specimens only), is one component of a comprehensive MRSA colonization surveillance program. It is not intended to diagnose MRSA infection nor to guide or monitor treatment for MRSA infections. RESULT CALLED TO, READ BACK BY AND VERIFIED WITH: Tia Masker RN [phone] 08/12/2019 HNM Performed at [facility], [address] [place], [place] [address]       Scheduled Meds: . amLODipine  2.5 mg Oral Daily  . cholecalciferol  2,000 Units Oral Daily  . clopidogrel  75 mg Oral Daily  . insulin aspart  0-15 Units Subcutaneous TID WC  . insulin aspart  0-5 Units Subcutaneous QHS  . ipratropium-albuterol  3 mL Inhalation Q4H  . levothyroxine  112.5 mcg Oral Once per day on Thu  . levothyroxine  75 mcg Oral Once per day on Sun Mon Tue Wed Fri Sat  . metoprolol tartrate  12.5 mg Oral BID  . multivitamin with minerals  1 tablet Oral Daily  . omega-3 acid ethyl esters  1 capsule Oral Daily  . pantoprazole  40 mg Oral BID  . polyethylene glycol  17 g Oral Daily  . pravastatin  40 mg Oral q1800  . predniSONE  20 mg Oral Q breakfast  . rOPINIRole  0.25 mg Oral QHS  . tamsulosin  0.4 mg Oral Daily  . vitamin C  500 mg Oral Daily   Continuous Infusions:   Assessment/Plan:  1. Acute hypoxic respiratory failure.  Prior COVID-19 infection and likely  postinflammatory fibrosis as per critical care specialist..  Patient currently requiring 53% oxygen on high flow nasal cannula.  The patient will remain in the hospital while on high flow nasal cannula.  2. Pneumonia, multifocal lobar, with leukocytosis.  White blood cell count still high.  Looks like antibiotics discontinued by Dr. Mortimer Fries critical care specialist. 3. Essential hypertension on low-dose metoprolol and Norvasc 4. Acute kidney injury on chronic kidney disease stage II.  Creatinine has now improved to 1.15 with GFR of 60. 5. Paroxysmal atrial fibrillation on metoprolol.  Amiodarone on hold with abnormal CT scan of the chest. 6. Iron deficiency anemia.  Received 1 unit of packed red blood cells and iron during the hospital course 7. Hypothyroidism on levothyroxine 8. BPH on Flomax.  Add Proscar since the patient having urinary symptoms of unable to get rid of urine completely 9. Restless leg syndrome on Requip 10. Weakness.  We will get physical therapy evaluation.  Code Status:     Code Status Orders  (From admission, onward)         Start     Ordered   08/11/19 1816  Full code  Continuous     08/11/19 1815        Code Status History    Date Active Date Inactive Code Status Order ID Comments User Context   07/10/2019 0126 07/19/2019 1808 Full Code [id number]  Vianne Bulls, MD ED   06/26/2019 1116 06/26/2019 1747 Full Code [id number]  [name], MD Inpatient   07/11/2018 1302 07/18/2018 1413 Full Code [id number]  [name], PA-C Inpatient   06/24/2018 1112 06/24/2018 1722 Full Code [id number]  [name], MD Inpatient   Advance Care Planning Activity     Family Communication: Spoke with the patient's wife at the bedside Disposition Plan: We will need to be off high flow nasal cannula before any disposition.  Consultants:  Critical care specialist  Time spent: 26 minutes  [employer]  [facility]

## 2019-08-19 ENCOUNTER — Telehealth: Payer: Self-pay

## 2019-08-19 DIAGNOSIS — E039 Hypothyroidism, unspecified: Secondary | ICD-10-CM

## 2019-08-19 LAB — POTASSIUM: Potassium: 6.4 mmol/L (ref 3.5–5.1)

## 2019-08-19 LAB — BASIC METABOLIC PANEL
Anion gap: 9 (ref 5–15)
Anion gap: 10 (ref 5–15)
BUN: 30 mg/dL — ABNORMAL HIGH (ref 8–23)
BUN: 32 mg/dL — ABNORMAL HIGH (ref 8–23)
CO2: 20 mmol/L — ABNORMAL LOW (ref 22–32)
CO2: 21 mmol/L — ABNORMAL LOW (ref 22–32)
Calcium: 8.4 mg/dL — ABNORMAL LOW (ref 8.9–10.3)
Calcium: 8.6 mg/dL — ABNORMAL LOW (ref 8.9–10.3)
Chloride: 103 mmol/L (ref 98–111)
Chloride: 98 mmol/L (ref 98–111)
Creatinine, Ser: 1.33 mg/dL — ABNORMAL HIGH (ref 0.61–1.24)
Creatinine, Ser: 1.65 mg/dL — ABNORMAL HIGH (ref 0.61–1.24)
GFR calc Af Amer: 58 mL/min — ABNORMAL LOW (ref >60)
GFR calc Af Amer: 45 mL/min — ABNORMAL LOW (ref >60)
GFR calc non Af Amer: 50 mL/min — ABNORMAL LOW (ref >60)
GFR calc non Af Amer: 39 mL/min — ABNORMAL LOW (ref >60)
Glucose, Bld: 78 mg/dL (ref 70–99)
Glucose, Bld: 86 mg/dL (ref 70–99)
Potassium: 5.5 mmol/L — ABNORMAL HIGH (ref 3.5–5.1)
Potassium: 5.8 mmol/L — ABNORMAL HIGH (ref 3.5–5.1)
Sodium: 132 mmol/L — ABNORMAL LOW (ref 135–145)
Sodium: 129 mmol/L — ABNORMAL LOW (ref 135–145)

## 2019-08-19 LAB — CBC WITH DIFFERENTIAL/PLATELET
Abs Immature Granulocytes: 4.92 10*3/uL — ABNORMAL HIGH (ref 0.00–0.07)
Basophils Absolute: 0 10*3/uL (ref 0.0–0.1)
Basophils Relative: 0 %
Eosinophils Absolute: 0 10*3/uL (ref 0.0–0.5)
Eosinophils Relative: 0 %
HCT: 27.4 % — ABNORMAL LOW (ref 39.0–52.0)
Hemoglobin: 9.2 g/dL — ABNORMAL LOW (ref 13.0–17.0)
Immature Granulocytes: 21 %
Lymphocytes Relative: 6 %
Lymphs Abs: 1.3 10*3/uL (ref 0.7–4.0)
MCH: 28.9 pg (ref 26.0–34.0)
MCHC: 33.6 g/dL (ref 30.0–36.0)
MCV: 86.2 fL (ref 80.0–100.0)
Monocytes Absolute: 1.7 10*3/uL — ABNORMAL HIGH (ref 0.1–1.0)
Monocytes Relative: 7 %
Neutro Abs: 15.8 10*3/uL — ABNORMAL HIGH (ref 1.7–7.7)
Neutrophils Relative %: 66 %
Platelets: 435 10*3/uL — ABNORMAL HIGH (ref 150–400)
RBC: 3.18 MIL/uL — ABNORMAL LOW (ref 4.22–5.81)
RDW: 19.6 % — ABNORMAL HIGH (ref 11.5–15.5)
Smear Review: NORMAL
WBC: 23.7 10*3/uL — ABNORMAL HIGH (ref 4.0–10.5)
nRBC: 0.3 % — ABNORMAL HIGH (ref 0.0–0.2)

## 2019-08-19 LAB — GLUCOSE, CAPILLARY
Glucose-Capillary: 68 mg/dL — ABNORMAL LOW (ref 70–99)
Glucose-Capillary: 201 mg/dL — ABNORMAL HIGH (ref 70–99)
Glucose-Capillary: 201 mg/dL — ABNORMAL HIGH (ref 70–99)
Glucose-Capillary: 91 mg/dL (ref 70–99)

## 2019-08-19 MED ORDER — SODIUM ZIRCONIUM CYCLOSILICATE 5 G PO PACK
10.0000 g | PACK | Freq: Once | ORAL | Status: AC
  Administered 2019-08-19: 10 g via ORAL
  Filled 2019-08-19: qty 2

## 2019-08-19 MED ORDER — DEXTROSE 10 % IV SOLN
Freq: Once | INTRAVENOUS | Status: AC
  Administered 2019-08-19: 50 mL/h via INTRAVENOUS

## 2019-08-19 MED ORDER — DEXTROSE 50 % IV SOLN
1.0000 | Freq: Once | INTRAVENOUS | Status: AC
  Administered 2019-08-19: 50 mL via INTRAVENOUS
  Filled 2019-08-19: qty 50

## 2019-08-19 MED ORDER — INSULIN ASPART 100 UNIT/ML IV SOLN
10.0000 [IU] | Freq: Once | INTRAVENOUS | Status: AC
  Administered 2019-08-19: 10 [IU] via INTRAVENOUS
  Filled 2019-08-19: qty 0.1

## 2019-08-19 NOTE — Telephone Encounter (Signed)
Mrs Esker [facility] signed) left v/m that pt has missed 4 calls from [facility]; pt is still at [facility] ICU and please call Mrs Bivens at [phone].

## 2019-08-19 NOTE — Progress Notes (Signed)
PT Cancellation Note  Patient Details Name: Ryan Pittman MRN: [id number] DOB: [birth year]   Cancelled Treatment:    Reason Eval/Treat Not Completed: Patient not medically ready.  Chart reviewed.  Pt's potassium noted to be elevated to 5.5 this morning.  Per PT guidelines for elevated potassium, exertional activity currently contra-indicated.  Will re-attempt PT evaluation at a later date/time as medically appropriate.  Leitha Bleak, PT 08/19/19, 11:16 AM

## 2019-08-19 NOTE — Telephone Encounter (Signed)
No record of patient being called. Are you aware of anyone calling the patient?

## 2019-08-19 NOTE — Progress Notes (Signed)
Patient ID: Ryan Pittman, male   DOB: 07/20/1941, 78 y.o.   MRN: [id number] [facility] PROGRESS NOTE  Ryan Pittman [id number] DOB: [birth year] DOA: 08/11/2019 PCP: Tonia Ghent, MD  HPI/Subjective: Patient still on high flow nasal cannula 55% FiO2.  Does not really complain of shortness of breath but desaturates with limited movement.  Patient doing better with his urination.  Sat in the chair yesterday and felt good.  Objective: Vitals:   08/19/19 1052 08/19/19 1053  BP: (!) 128/52   Pulse:  86  Resp:    Temp:    SpO2:      Intake/Output Summary (Last 24 hours) at 08/19/2019 1430 Last data filed at 08/19/2019 0600 Gross per 24 hour  Intake 360 ml  Output 1400 ml  Net -1040 ml   Filed Weights   08/11/19 0958  Weight: 68 kg    ROS: Review of Systems  Constitutional: Negative for chills and fever.  Eyes: Negative for blurred vision.  Respiratory: Positive for shortness of breath. Negative for cough.   Cardiovascular: Negative for chest pain.  Gastrointestinal: Negative for abdominal pain, constipation, diarrhea, nausea and vomiting.  Genitourinary: Negative for dysuria.  Musculoskeletal: Negative for joint pain.  Neurological: Negative for dizziness and headaches.   Exam: Physical Exam  Constitutional: He is oriented to person, place, and time.  HENT:  Nose: No mucosal edema.  Mouth/Throat: No oropharyngeal exudate or posterior oropharyngeal edema.  Eyes: Pupils are equal, round, and reactive to light. Conjunctivae, EOM and lids are normal.  Neck: Carotid bruit is not present.  Cardiovascular: S1 normal and S2 normal. Exam reveals no gallop.  No murmur heard. Pulses:      Dorsalis pedis pulses are 2+ on the right side and 2+ on the left side.  Respiratory: No respiratory distress. He has decreased breath sounds in the right lower field and the left lower field. He has no wheezes. He has no rhonchi. He has no rales.  GI: Soft. Bowel sounds are  normal. There is no abdominal tenderness.  Musculoskeletal:     Right ankle: No swelling.     Left ankle: No swelling.  Lymphadenopathy:    He has no cervical adenopathy.  Neurological: He is alert and oriented to person, place, and time. No cranial nerve deficit.  Able to straight leg raise bilaterally.  Skin: Skin is warm. No rash noted. Nails show no clubbing.  Psychiatric: He has a normal mood and affect.      Data Reviewed: Basic Metabolic Panel: Recent Labs  Lab 08/13/19 0414 08/14/19 0432 08/15/19 0741 08/16/19 0509 08/18/19 0749 08/19/19 0433  NA 139 140 137 138 135 132*  K 4.2 4.1 3.9 4.2 4.7 5.5*  CL 109 109 105 106 104 103  CO2 21* 21* 23 24 21* 20*  GLUCOSE 174* 169* 101* 88 75 78  BUN 40* 48* 40* 32* 30* 30*  CREATININE 1.08 1.26* 1.09 1.15 1.36* 1.33*  CALCIUM 7.9* 8.3* 8.1* 7.9* 8.3* 8.4*  MG 2.2 2.2 2.1 2.1  --   --   PHOS  --   --  2.9  --   --   --    Liver Function Tests: Recent Labs  Lab 08/13/19 0414 08/14/19 0432 08/15/19 0741  AST 17 19  --   ALT 12 15  --   ALKPHOS 51 55  --   BILITOT 0.4 0.9  --   PROT 5.1* 5.1*  --   ALBUMIN 1.7* 1.8* 1.8*  CBC: Recent Labs  Lab 08/13/19 0414 08/14/19 0432 08/15/19 0741 08/16/19 0509 08/18/19 0749 08/19/19 0433  WBC 25.3* 29.1* 33.2* 37.2* 33.6* 23.7*  NEUTROABS 22.3* 24.1* 24.5* 27.0*  --  15.8*  HGB 6.7* 8.6* 9.3* 8.7* 9.7* 9.2*  HCT 19.9* 25.5* 28.7* 25.8* 29.2* 27.4*  MCV 85.8 85.0 88.9 85.1 86.6 86.2  PLT 482* 556* 585* 517* 472* 435*    BNP (last 3 results) Recent Labs    07/15/19 0609 07/16/19 0225 08/11/19 1857  BNP 146.9* 105.2* 304.0*     CBG: Recent Labs  Lab 08/18/19 1212 08/18/19 1619 08/18/19 2121 08/19/19 0749 08/19/19 1152  GLUCAP 115* 149* 152* 68* 201*    Recent Results (from the past 240 hour(s))  SARS CORONAVIRUS 2 (TAT 6-24 HRS) Nasopharyngeal Nasopharyngeal Swab     Status: None   Collection Time: 08/11/19 10:00 AM   Specimen: Nasopharyngeal Swab   Result Value Ref Range Status   SARS Coronavirus 2 NEGATIVE NEGATIVE Final    Comment: (NOTE) SARS-CoV-2 target nucleic acids are NOT DETECTED. The SARS-CoV-2 RNA is generally detectable in upper and lower respiratory specimens during the acute phase of infection. Negative results do not preclude SARS-CoV-2 infection, do not rule out co-infections with other pathogens, and should not be used as the sole basis for treatment or other patient management decisions. Negative results must be combined with clinical observations, patient history, and epidemiological information. The expected result is Negative. Fact Sheet for Patients: SugarRoll.be Fact Sheet for Healthcare Providers: https://www.woods-mathews.com/ This test is not yet approved or cleared by the [place] FDA and  has been authorized for detection and/or diagnosis of SARS-CoV-2 by FDA under an Emergency Use Authorization (EUA). This EUA will remain  in effect (meaning this test can be used) for the duration of the COVID-19 declaration under Section 56 4(b)(1) of the Act, 21 U.S.C. section 360bbb-3(b)(1), unless the authorization is terminated or revoked sooner. Performed at [facility], [place] [address] [place], [place] [address]   Blood Culture (routine x 2)     Status: None   Collection Time: 08/11/19 10:03 AM   Specimen: BLOOD  Result Value Ref Range Status   Specimen Description BLOOD LEFT HAND  Final   Special Requests   Final    BOTTLES DRAWN AEROBIC AND ANAEROBIC Blood Culture adequate volume   Culture   Final    NO GROWTH 5 DAYS Performed at [facility], [address] [place],  [address]    Report Status 08/16/2019 FINAL  Final  Blood Culture (routine x 2)     Status: None   Collection Time: 08/11/19 10:31 AM   Specimen: BLOOD  Result Value Ref Range Status   Specimen Description BLOOD BLOOD RIGHT FOREARM  Final   Special Requests    Final    BOTTLES DRAWN AEROBIC AND ANAEROBIC Blood Culture adequate volume   Culture   Final    NO GROWTH 5 DAYS Performed at [facility], [address], [place],  [address]    Report Status 08/16/2019 FINAL  Final  MRSA PCR Screening     Status: Abnormal   Collection Time: 08/12/19  2:04 AM   Specimen: Nasopharyngeal  Result Value Ref Range Status   MRSA by PCR POSITIVE (A) NEGATIVE Final    Comment:        The GeneXpert MRSA Assay (FDA approved for NASAL specimens only), is one component of a comprehensive MRSA colonization surveillance program. It is not intended  to diagnose MRSA infection nor to guide or monitor treatment for MRSA infections. RESULT CALLED TO, READ BACK BY AND VERIFIED WITH: Tia Masker RN [phone] 08/12/2019 HNM Performed at [facility], [address] [place], [place] [address]       Scheduled Meds: . amLODipine  2.5 mg Oral Daily  . Chlorhexidine Gluconate Cloth  6 each Topical Daily  . cholecalciferol  2,000 Units Oral Daily  . clopidogrel  75 mg Oral Daily  . finasteride  5 mg Oral Daily  . insulin aspart  0-15 Units Subcutaneous TID WC  . insulin aspart  0-5 Units Subcutaneous QHS  . ipratropium-albuterol  3 mL Inhalation Q4H  . levothyroxine  112.5 mcg Oral Once per day on Thu  . levothyroxine  75 mcg Oral Once per day on Sun Mon Tue Wed Fri Sat  . metoprolol tartrate  12.5 mg Oral BID  . multivitamin with minerals  1 tablet Oral Daily  . omega-3 acid ethyl esters  1 capsule Oral Daily  . pantoprazole  40 mg Oral BID  . polyethylene glycol  17 g Oral Daily  . pravastatin  40 mg Oral q1800  . predniSONE  20 mg Oral Q breakfast  . rOPINIRole  0.25 mg Oral QHS  . tamsulosin  0.4 mg Oral Daily  . vitamin C  500 mg Oral Daily   Continuous Infusions:   Assessment/Plan:  1. Acute hypoxic respiratory failure.  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   Patient currently requiring 55% oxygen on high flow nasal cannula.  The patient will remain in the hospital while on high flow nasal cannula.  2. Pneumonia, multifocal lobar, with leukocytosis.  White blood cell count still high, but trending down.  Antibiotics discontinued by critical care specialist. 3. Essential hypertension on low-dose metoprolol and Norvasc 4. Acute kidney injury on chronic kidney disease stage III.  Creatinine up a little bit at 1.33. 5. Hyperkalemia.  Repeat potassium today and treat if still elevated. 6. Paroxysmal atrial fibrillation on metoprolol.  Amiodarone on hold with abnormal CT scan of the chest. 7. Iron deficiency anemia.  Received 1 unit of packed red blood cells and iron during the hospital course 8. Hypothyroidism on levothyroxine 9. BPH on Flomax.  Added Proscar since the patient having urinary symptoms of unable to get rid of urine completely. 10. Restless leg syndrome on Requip 11. Weakness.  Awaiting physical therapy evaluation  Code Status:     Code Status Orders  (From admission, onward)         Start     Ordered   08/11/19 1816  Full code  Continuous     08/11/19 1815        Code Status History    Date Active Date Inactive Code Status Order ID Comments User Context   07/10/2019 0126 07/19/2019 1808 Full Code [id number]  Vianne Bulls, MD ED   06/26/2019 1116 06/26/2019 1747 Full Code [id number]  [name], MD Inpatient   07/11/2018 1302 07/18/2018 1413 Full Code [id number]  Miguel Aschoff Inpatient   06/24/2018 1112 06/24/2018 1722 Full Code [id number]  [name], MD Inpatient   Advance Care Planning Activity     Family Communication: Spoke with the patient's wife at the bedside Disposition Plan: We will need to be off high flow nasal cannula before any disposition.  Consultants:  Critical care specialist  Time spent: 27 minutes  [place]

## 2019-08-19 NOTE — Telephone Encounter (Signed)
Left message on voicemail for Mrs. Holster to call back.

## 2019-08-19 NOTE — Telephone Encounter (Signed)
I hadn't called this week.  I am not aware of other outgoing calls.  I have been thinking about her and Mr. Febres.  Please let me know if there is anything that I can do for her.  The inpatient team will send me updates.  Thanks.

## 2019-08-19 NOTE — TOC Progression Note (Addendum)
Transition of Care [facility]) - Progression Note    Patient Details  Name: Ryan Pittman MRN: [id number] Date of Birth: 02/18/1941  Transition of [facility]) CM/SW [place], LCSW Phone Number: 08/19/2019, 11:33 AM  Clinical Narrative:  Discussed potential for LTACH with patient and his wife over the phone. First preference would be [facility]. Clinical liaison is aware and will review referral. Also discussed possibility of weaning oxygen and returning home with NIV. That would be the preference if they are able to do so. Will continue to follow.   11:35 am: Received call from [facility] liaison. She said she will have to review patient closer to discharge because he appears to be on the borderline of needing LTACH or not.  Expected Discharge Plan: [place] Barriers to Discharge: Continued Medical Work up  Expected Discharge Plan and Services Expected Discharge Plan: [place] arrangements for the past 2 months: Single Family Home                                       Social Determinants of Health (SDOH) Interventions    Readmission Risk Interventions Readmission Risk Prevention Plan 08/15/2019 07/15/2019  Transportation Screening Complete Complete  Palliative Care Screening Not Applicable -  Medication Review (RN Care Manager) Complete Complete  Some recent data might be hidden

## 2019-08-20 LAB — BASIC METABOLIC PANEL
Anion gap: 7 (ref 5–15)
BUN: 27 mg/dL — ABNORMAL HIGH (ref 8–23)
CO2: 26 mmol/L (ref 22–32)
Calcium: 8.7 mg/dL — ABNORMAL LOW (ref 8.9–10.3)
Chloride: 101 mmol/L (ref 98–111)
Creatinine, Ser: 1.4 mg/dL — ABNORMAL HIGH (ref 0.61–1.24)
GFR calc Af Amer: 55 mL/min — ABNORMAL LOW (ref >60)
GFR calc non Af Amer: 47 mL/min — ABNORMAL LOW (ref >60)
Glucose, Bld: 87 mg/dL (ref 70–99)
Potassium: 5.1 mmol/L (ref 3.5–5.1)
Sodium: 134 mmol/L — ABNORMAL LOW (ref 135–145)

## 2019-08-20 LAB — GLUCOSE, CAPILLARY
Glucose-Capillary: 71 mg/dL (ref 70–99)
Glucose-Capillary: 162 mg/dL — ABNORMAL HIGH (ref 70–99)
Glucose-Capillary: 123 mg/dL — ABNORMAL HIGH (ref 70–99)
Glucose-Capillary: 115 mg/dL — ABNORMAL HIGH (ref 70–99)

## 2019-08-20 LAB — ASPERGILLUS ANTIGEN, BAL/SERUM: Aspergillus Ag, BAL/Serum: 0.03 Index (ref 0.00–0.49)

## 2019-08-20 MED ORDER — ENOXAPARIN SODIUM 40 MG/0.4ML ~~LOC~~ SOLN
40.0000 mg | Freq: Every day | SUBCUTANEOUS | Status: DC
  Administered 2019-08-20 – 2019-08-28 (×9): 40 mg via SUBCUTANEOUS
  Filled 2019-08-20 (×9): qty 0.4

## 2019-08-20 MED ORDER — DOCUSATE SODIUM 100 MG PO CAPS
100.0000 mg | ORAL_CAPSULE | Freq: Two times a day (BID) | ORAL | Status: DC
  Administered 2019-08-20 – 2019-08-28 (×17): 100 mg via ORAL
  Filled 2019-08-20 (×17): qty 1

## 2019-08-20 MED ORDER — BISACODYL 5 MG PO TBEC
5.0000 mg | DELAYED_RELEASE_TABLET | Freq: Every day | ORAL | Status: DC | PRN
  Administered 2019-08-20: 5 mg via ORAL
  Filled 2019-08-20: qty 1

## 2019-08-20 NOTE — Evaluation (Signed)
Physical Therapy Evaluation Patient Details Name: Ryan Pittman MRN: [id number] DOB: 1940/10/20 Today's Date: 08/20/2019   History of Present Illness  Pt is an 78 y.o. male presenting to hospital 08/11/19 with increased SOB and cough.  Of note, pt (+) COVID about 4 weeks prior and hospitalized 11/4-11/15 d/t COVID-19 infection and discharged home on supplemental O2.  Pt now admitted with acute hypoxic respiratory failure secondary multi-focal PNA vs pneumonitis and pulmonary edema requiring HFNC.  S/p 1 unit PRBC's 12/9.  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  PMH includes CABG, anemia, angina, CKD, gastric reflux, htn.  Clinical Impression  Prior to hospital admission, pt was ambulatory; had supplemental home O2; and lives with his wife.  Currently pt is SBA semi-supine to sitting edge of bed; min assist with transfers; and min assist to walk a few feet bed to recliner.  O2 sats initially 94% on HFNC (40 L/min at 50% FiO2) at rest; O2 sats decreased to 92% after getting to recliner; but then O2 sats decreased to 83% after coughing episode and SOB noted (deferred further mobility d/t this).  Pt requiring vc's for pursed lip breathing and a few minutes of sitting rest in order to recover breathing and O2 sats to 93% on HFNC end of session.  Nurse notified of pt's O2 sats during session.  Pt would benefit from skilled PT to address noted impairments and functional limitations (see below for any additional details).  Upon hospital discharge, anticipate pt would benefit from [facility].    Follow Up Recommendations LTACH    Equipment Recommendations  Rolling walker with 5" wheels;3in1 (PT);Wheelchair (measurements PT);Wheelchair cushion (measurements PT)    Recommendations for Other Services OT consult     Precautions / Restrictions Precautions Precautions: Fall Restrictions Weight Bearing Restrictions: No      Mobility  Bed Mobility Overal bed mobility: Needs Assistance Bed Mobility:  Supine to Sit     Supine to sit: Supervision;HOB elevated     General bed mobility comments: Semi-supine to sit SBA for lines; mild increased effort to perform on own  Transfers Overall transfer level: Needs assistance Equipment used: None Transfers: Sit to/from Stand Sit to Stand: Min assist         General transfer comment: fairly strong stand (min assist initially for balance once standing); controlled descent sitting with CGA  Ambulation/Gait Ambulation/Gait assistance: Min assist Gait Distance (Feet): 3 Feet(bed to recliner) Assistive device: None   Gait velocity: decreased   General Gait Details: mildly unsteady but able to take steps bed to recliner with 1 assist  Stairs            Wheelchair Mobility    Modified Rankin (Stroke Patients Only)       Balance Overall balance assessment: Needs assistance Sitting-balance support: No upper extremity supported;Feet supported Sitting balance-Leahy Scale: Normal Sitting balance - Comments: steady sitting reaching outside BOS   Standing balance support: No upper extremity supported Standing balance-Leahy Scale: Poor Standing balance comment: initially min assist for balance upon standing (then CGA)                             Pertinent Vitals/Pain Pain Assessment: No/denies pain  HR WFL during session's activities.    Home Living Family/patient expects to be discharged to:: Private residence Living Arrangements: Spouse/significant other Available Help at Discharge: Family Type of Home: House Home Access: Stairs to enter   [employer] of Steps: 1 step (no  railing) to enter from back; 3-4 steps with B railing to enter from front Home Layout: Two level;Able to live on main level with bedroom/bathroom   Additional Comments: Supplemental O2    Prior Function Level of Independence: Independent               Hand Dominance        Extremity/Trunk Assessment   Upper  Extremity Assessment Upper Extremity Assessment: Generalized weakness    Lower Extremity Assessment Lower Extremity Assessment: Generalized weakness    Cervical / Trunk Assessment Cervical / Trunk Assessment: Normal  Communication   Communication: No difficulties  Cognition Arousal/Alertness: Awake/alert Behavior During Therapy: WFL for tasks assessed/performed Overall Cognitive Status: Within Functional Limits for tasks assessed                                        General Comments   Nursing cleared pt for participation in physical therapy.  Pt agreeable to PT session.    Exercises     Assessment/Plan    PT Assessment Patient needs continued PT services  PT Problem List Decreased strength;Decreased activity tolerance;Decreased balance;Decreased mobility;Decreased knowledge of use of DME;Cardiopulmonary status limiting activity;Decreased knowledge of precautions       PT Treatment Interventions DME instruction;Gait training;Stair training;Functional mobility training;Therapeutic activities;Therapeutic exercise;Balance training;Patient/family education    PT Goals (Current goals can be found in the Care Plan section)  Acute Rehab PT Goals Patient Stated Goal: to go home PT Goal Formulation: With patient Time For Goal Achievement: 09/03/19 Potential to Achieve Goals: Fair    Frequency Min 2X/week   Barriers to discharge        Co-evaluation               AM-PAC PT "6 Clicks" Mobility  Outcome Measure Help needed turning from your back to your side while in a flat bed without using bedrails?: None Help needed moving from lying on your back to sitting on the side of a flat bed without using bedrails?: A Little Help needed moving to and from a bed to a chair (including a wheelchair)?: A Little Help needed standing up from a chair using your arms (e.g., wheelchair or bedside chair)?: A Little Help needed to walk in hospital room?: A Little Help  needed climbing 3-5 steps with a railing? : A Lot 6 Click Score: 18    End of Session Equipment Utilized During Treatment: Gait belt;Oxygen(HFNC) Activity Tolerance: Patient tolerated treatment well(except O2 desaturation with coughing) Patient left: in chair;with call bell/phone within reach Nurse Communication: Mobility status;Precautions;Other (comment)(Pt's O2 sats during session; nurse cleared pt to not have chair alarm) PT Visit Diagnosis: Other abnormalities of gait and mobility (R26.89);Unsteadiness on feet (R26.81);Muscle weakness (generalized) (M62.81);Difficulty in walking, not elsewhere classified (R26.2)    Time: [id number] PT Time Calculation (min) (ACUTE ONLY): 21 min   Charges:   PT Evaluation $PT Eval Low Complexity: 1 Low          Richardson Dubree, PT 08/20/19, 9:43 AM

## 2019-08-20 NOTE — Progress Notes (Signed)
PROGRESS NOTE    Ryan Pittman  [id number] DOB: [birth year] DOA: 08/11/2019 PCP: Tonia Ghent, MD     Assessment & Plan:   Principal Problem:   Multifocal pneumonia Active Problems:   CKD (chronic kidney disease) stage 3, GFR 30-59 ml/min   BPH with obstruction/lower urinary tract symptoms   HTN (hypertension)   Hyperlipidemia   Hypothyroidism   PAF (paroxysmal atrial fibrillation) (HCC)   CAD (coronary artery disease)   Acute kidney injury superimposed on CKD ([place])   Pneumonia due to COVID-19 virus   Normocytic anemia   Acute respiratory disease due to COVID-19 virus   Acute on chronic respiratory failure with hypoxia (HCC)  Acute hypoxic respiratory failure: likely multifactorial. 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 Continue on HFNC and wean as tolerated.   Pneumonia: multifocal lobar. Likely secondary to [place]. Abx d/c as per ICU team  Essential hypertension: continue metoprolol amlodipine  AKI on CKD: Cr is trending down today. Avoid nephroxtoxins. Will continue to monitor   Hyperkalemia: resolved. Will continue to monitor  Paroxysmal atrial fibrillation: continue on metoprolol.  Amiodarone on hold with abnormal CT scan of the chest.  Iron deficiency anemia.  s/p 1 unit of packed red blood cells and iron during the hospital course. No need for a transfusion at this time  Hypothyroidism: continue on levothyroxine  BPH: continue on Flomax, Proscar   Restless leg syndrome: continue on Requip  Weakness: PT consulted     DVT prophylaxis: lovenox Code Status: full  Family Communication: spoke w/ wife who was at bedside and answered all of her questions Disposition Plan:    Consultants:   ICU   Procedures:    Antimicrobials: n/a   Subjective: Pt c/o shortness of breath  Objective: Vitals:   08/20/19 1600 08/20/19 1618 08/20/19 1630 08/20/19 1700  BP: (!) 134/54     Pulse: 69    83  Resp: (!) 23   (!) 25  Temp:      TempSrc:      SpO2: 96% 97% 95% 96%  Weight:      Height:        Intake/Output Summary (Last 24 hours) at 08/20/2019 1731 Last data filed at 08/20/2019 1600 Gross per 24 hour  Intake 470.57 ml  Output 2150 ml  Net -1679.43 ml   Filed Weights   08/11/19 0958  Weight: 68 kg    Examination:  General exam: Appears calm and comfortable  Respiratory system: diminished breath sounds b/l. No rales Cardiovascular system: S1 & S2 normal. No rubs, gallops or clicks.  Gastrointestinal system: Abdomen is nondistended, soft and nontender.  Normal bowel sounds heard. Central nervous system: Alert and oriented. Moves all 4 extremities  Psychiatry: Judgement and insight appear normal. Mood & affect appropriate.     Data Reviewed: I have personally reviewed following labs and imaging studies  CBC: Recent Labs  Lab 08/14/19 0432 08/15/19 0741 08/16/19 0509 08/18/19 0749 08/19/19 0433  WBC 29.1* 33.2* 37.2* 33.6* 23.7*  NEUTROABS 24.1* 24.5* 27.0*  --  15.8*  HGB 8.6* 9.3* 8.7* 9.7* 9.2*  HCT 25.5* 28.7* 25.8* 29.2* 27.4*  MCV 85.0 88.9 85.1 86.6 86.2  PLT 556* 585* 517* 472* [id number]*   Basic Metabolic Panel: Recent Labs  Lab 08/14/19 0432 08/15/19 0741 08/16/19 0509 08/18/19 0749 08/19/19 0433 08/19/19 1528 08/19/19 1946 08/20/19 0424  NA 140 137 138 135 132*  --  129* 134*  K 4.1 3.9 4.2 4.7  5.5* 6.4* 5.8* 5.1  CL 109 105 106 104 103  --  98 101  CO2 21* 23 24 21* 20*  --  21* 26  GLUCOSE 169* 101* 88 75 78  --  86 87  BUN 48* 40* 32* 30* 30*  --  32* 27*  CREATININE 1.26* 1.09 1.15 1.36* 1.33*  --  1.65* 1.40*  CALCIUM 8.3* 8.1* 7.9* 8.3* 8.4*  --  8.6* 8.7*  MG 2.2 2.1 2.1  --   --   --   --   --   PHOS  --  2.9  --   --   --   --   --   --    GFR: Estimated Creatinine Clearance: 39.3 mL/min (A) (by C-G formula based on SCr of 1.4 mg/dL (H)). Liver Function Tests: Recent Labs  Lab 08/14/19 0432 08/15/19 0741  AST 19  --    ALT 15  --   ALKPHOS 55  --   BILITOT 0.9  --   PROT 5.1*  --   ALBUMIN 1.8* 1.8*   No results for input(s): LIPASE, AMYLASE in the last 168 hours. No results for input(s): AMMONIA in the last 168 hours. Coagulation Profile: No results for input(s): INR, PROTIME in the last 168 hours. Cardiac Enzymes: No results for input(s): CKTOTAL, CKMB, CKMBINDEX, TROPONINI in the last 168 hours. BNP (last 3 results) No results for input(s): PROBNP in the last 8760 hours. HbA1C: No results for input(s): HGBA1C in the last 72 hours. CBG: Recent Labs  Lab 08/19/19 1622 08/19/19 2200 08/20/19 0745 08/20/19 1114 08/20/19 1542  GLUCAP 201* 91 71 162* 123*   Lipid Profile: No results for input(s): CHOL, HDL, LDLCALC, TRIG, CHOLHDL, LDLDIRECT in the last 72 hours. Thyroid Function Tests: No results for input(s): TSH, T4TOTAL, FREET4, T3FREE, THYROIDAB in the last 72 hours. Anemia Panel: No results for input(s): VITAMINB12, FOLATE, FERRITIN, TIBC, IRON, RETICCTPCT in the last 72 hours. Sepsis Labs: Recent Labs  Lab 08/14/19 0432  PROCALCITON <0.10    Recent Results (from the past 240 hour(s))  SARS CORONAVIRUS 2 (TAT 6-24 HRS) Nasopharyngeal Nasopharyngeal Swab     Status: None   Collection Time: 08/11/19 10:00 AM   Specimen: Nasopharyngeal Swab  Result Value Ref Range Status   SARS Coronavirus 2 NEGATIVE NEGATIVE Final    Comment: (NOTE) SARS-CoV-2 target nucleic acids are NOT DETECTED. The SARS-CoV-2 RNA is generally detectable in upper and lower respiratory specimens during the acute phase of infection. Negative results do not preclude SARS-CoV-2 infection, do not rule out co-infections with other pathogens, and should not be used as the sole basis for treatment or other patient management decisions. Negative results must be combined with clinical observations, patient history, and epidemiological information. The expected result is Negative. Fact Sheet for  Patients: SugarRoll.be Fact Sheet for Healthcare Providers: https://www.woods-mathews.com/ This test is not yet approved or cleared by the [place] FDA and  has been authorized for detection and/or diagnosis of SARS-CoV-2 by FDA under an Emergency Use Authorization (EUA). This EUA will remain  in effect (meaning this test can be used) for the duration of the COVID-19 declaration under Section 56 4(b)(1) of the Act, 21 U.S.C. section 360bbb-3(b)(1), unless the authorization is terminated or revoked sooner. Performed at [facility], [place] [address] [place], [place] [address]   Blood Culture (routine x 2)     Status: None   Collection Time: 08/11/19 10:03 AM   Specimen: BLOOD  Result  Value Ref Range Status   Specimen Description BLOOD LEFT HAND  Final   Special Requests   Final    BOTTLES DRAWN AEROBIC AND ANAEROBIC Blood Culture adequate volume   Culture   Final    NO GROWTH 5 DAYS Performed at [facility], [address] [place], [place] [address]    Report Status 08/16/2019 FINAL  Final  Blood Culture (routine x 2)     Status: None   Collection Time: 08/11/19 10:31 AM   Specimen: BLOOD  Result Value Ref Range Status   Specimen Description BLOOD BLOOD RIGHT FOREARM  Final   Special Requests   Final    BOTTLES DRAWN AEROBIC AND ANAEROBIC Blood Culture adequate volume   Culture   Final    NO GROWTH 5 DAYS Performed at [facility], [address] [place], [place] [address]    Report Status 08/16/2019 FINAL  Final  MRSA PCR Screening     Status: Abnormal   Collection Time: 08/12/19  2:04 AM   Specimen: Nasopharyngeal  Result Value Ref Range Status   MRSA by PCR POSITIVE (A) NEGATIVE Final    Comment:        The GeneXpert MRSA Assay (FDA approved for NASAL specimens only), is one component of a comprehensive MRSA colonization surveillance program. It is not intended to diagnose MRSA infection nor  to guide or monitor treatment for MRSA infections. RESULT CALLED TO, READ BACK BY AND VERIFIED WITH: Tia Masker RN [phone] 08/12/2019 HNM Performed at [facility], [address] [place], [place] [address]   Aspergillus Ag, BAL/Serum     Status: None   Collection Time: 08/16/19  5:10 AM  Result Value Ref Range Status   Aspergillus Ag, BAL/Serum 0.03 0.00 - 0.49 Index Final    Comment: (NOTE) Performed At: [facility] [address] [place], [place] [id number] Rush Farmer MD [id number] Performed At: [facility] [address] [place], [place] [id number] Katina Degree MDPhD [id number]          Radiology Studies: No results found.      Scheduled Meds: . amLODipine  2.5 mg Oral Daily  . Chlorhexidine Gluconate Cloth  6 each Topical Daily  . cholecalciferol  2,000 Units Oral Daily  . clopidogrel  75 mg Oral Daily  . docusate sodium  100 mg Oral BID  . enoxaparin (LOVENOX) injection  40 mg Subcutaneous Daily  . finasteride  5 mg Oral Daily  . insulin aspart  0-15 Units Subcutaneous TID WC  . insulin aspart  0-5 Units Subcutaneous QHS  . ipratropium-albuterol  3 mL Inhalation Q4H  . levothyroxine  112.5 mcg Oral Once per day on Thu  . levothyroxine  75 mcg Oral Once per day on Sun Mon Tue Wed Fri Sat  . metoprolol tartrate  12.5 mg Oral BID  . multivitamin with minerals  1 tablet Oral Daily  . omega-3 acid ethyl esters  1 capsule Oral Daily  . pantoprazole  40 mg Oral BID  . polyethylene glycol  17 g Oral Daily  . pravastatin  40 mg Oral q1800  . predniSONE  20 mg Oral Q breakfast  . rOPINIRole  0.25 mg Oral QHS  . tamsulosin  0.4 mg Oral Daily  . vitamin C  500 mg Oral Daily   Continuous Infusions:   LOS: 9 days    Time spent: 30 mins    Wyvonnia Dusky, MD [facility] Pager [phone]  If 7PM-7AM,  please contact night-coverage [email or web address] Password [facility] 08/20/2019, 5:31 PM

## 2019-08-21 ENCOUNTER — Other Ambulatory Visit: Payer: Medicare Other

## 2019-08-21 LAB — CBC WITH DIFFERENTIAL/PLATELET
Abs Immature Granulocytes: 5.68 10*3/uL — ABNORMAL HIGH (ref 0.00–0.07)
Basophils Absolute: 0 10*3/uL (ref 0.0–0.1)
Basophils Relative: 0 %
Eosinophils Absolute: 0 10*3/uL (ref 0.0–0.5)
Eosinophils Relative: 0 %
HCT: 32 % — ABNORMAL LOW (ref 39.0–52.0)
Hemoglobin: 10.5 g/dL — ABNORMAL LOW (ref 13.0–17.0)
Immature Granulocytes: 20 %
Lymphocytes Relative: 6 %
Lymphs Abs: 1.7 10*3/uL (ref 0.7–4.0)
MCH: 28.8 pg (ref 26.0–34.0)
MCHC: 32.8 g/dL (ref 30.0–36.0)
MCV: 87.7 fL (ref 80.0–100.0)
Monocytes Absolute: 1.8 10*3/uL — ABNORMAL HIGH (ref 0.1–1.0)
Monocytes Relative: 6 %
Neutro Abs: 19.6 10*3/uL — ABNORMAL HIGH (ref 1.7–7.7)
Neutrophils Relative %: 68 %
Platelets: 359 10*3/uL (ref 150–400)
RBC: 3.65 MIL/uL — ABNORMAL LOW (ref 4.22–5.81)
RDW: 19.8 % — ABNORMAL HIGH (ref 11.5–15.5)
Smear Review: NORMAL
WBC: 28.7 10*3/uL — ABNORMAL HIGH (ref 4.0–10.5)
nRBC: 0.2 % (ref 0.0–0.2)

## 2019-08-21 LAB — BASIC METABOLIC PANEL
Anion gap: 8 (ref 5–15)
BUN: 26 mg/dL — ABNORMAL HIGH (ref 8–23)
CO2: 24 mmol/L (ref 22–32)
Calcium: 9 mg/dL (ref 8.9–10.3)
Chloride: 102 mmol/L (ref 98–111)
Creatinine, Ser: 1.3 mg/dL — ABNORMAL HIGH (ref 0.61–1.24)
GFR calc Af Amer: 60 mL/min — ABNORMAL LOW (ref >60)
GFR calc non Af Amer: 52 mL/min — ABNORMAL LOW (ref >60)
Glucose, Bld: 83 mg/dL (ref 70–99)
Potassium: 5.1 mmol/L (ref 3.5–5.1)
Sodium: 134 mmol/L — ABNORMAL LOW (ref 135–145)

## 2019-08-21 LAB — GLUCOSE, CAPILLARY
Glucose-Capillary: 80 mg/dL (ref 70–99)
Glucose-Capillary: 150 mg/dL — ABNORMAL HIGH (ref 70–99)
Glucose-Capillary: 218 mg/dL — ABNORMAL HIGH (ref 70–99)
Glucose-Capillary: 76 mg/dL (ref 70–99)

## 2019-08-21 LAB — MAGNESIUM: Magnesium: 2 mg/dL (ref 1.7–2.4)

## 2019-08-21 NOTE — Progress Notes (Signed)
PROGRESS NOTE    Ryan Pittman  [id number] DOB: Nov 08, 1940 DOA: 08/11/2019 PCP: Tonia Ghent, MD     Assessment & Plan:   Principal Problem:   Multifocal pneumonia Active Problems:   CKD (chronic kidney disease) stage 3, GFR 30-59 ml/min   BPH with obstruction/lower urinary tract symptoms   HTN (hypertension)   Hyperlipidemia   Hypothyroidism   PAF (paroxysmal atrial fibrillation) (HCC)   CAD (coronary artery disease)   Acute kidney injury superimposed on CKD ([place])   Pneumonia due to COVID-19 virus   Normocytic anemia   Acute respiratory disease due to COVID-19 virus   Acute on chronic respiratory failure with hypoxia (HCC)  Acute hypoxic respiratory failure: Improved. Likely multifactorial. 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 Continue on supplemental oxygen and wean back to baseline as tolerated, currently on 8L [place]  Pneumonia: multifocal lobar. Likely secondary to [place]. Abx d/c as per ICU team  Essential hypertension: continue metoprolol, amlodipine  AKI on CKD: Cr continues to trend down. Avoid nephroxtoxins. Will continue to monitor   Hyperkalemia: resolved. Will continue to monitor  Paroxysmal atrial fibrillation: continue on metoprolol.  Amiodarone on hold with abnormal CT scan of the chest.  Iron deficiency anemia: s/p 1 unit of packed red blood cells and iron during the hospital course. H&H are trending up today. No need for a transfusion at this time  Hypothyroidism: continue on levothyroxine  BPH: continue on Flomax, Proscar   Restless leg syndrome: continue on Requip  Weakness: PT recommends LTAC, OT recommends home health OT. CM aware     DVT prophylaxis: lovenox Code Status: full  Family Communication: spoke w/ wife who was at bedside and answered all of her questions Disposition Plan:    Consultants:   ICU   Antimicrobials: n/a   Subjective: Pt c/o shortness of  breath but improved from day prior.  Objective: Vitals:   08/21/19 0500 08/21/19 0600 08/21/19 0728 08/21/19 0808  BP:  (!) 145/58 126/69   Pulse: 64 82    Resp: 19 20    Temp:   97.9 F (36.6 C)   TempSrc:   Oral   SpO2: 97% 96%  98%  Weight:      Height:        Intake/Output Summary (Last 24 hours) at 08/21/2019 0908 Last data filed at 08/21/2019 0729 Gross per 24 hour  Intake 240 ml  Output 1775 ml  Net -1535 ml   Filed Weights   08/11/19 0958  Weight: 68 kg    Examination:  General exam: Appears calm and comfortable  Respiratory system: decreased breath sounds b/l. No rales Cardiovascular system: S1 & S2 normal. No rubs, gallops or clicks.  Gastrointestinal system: Abdomen is nondistended, soft and nontender.  Normal bowel sounds heard. Central nervous system: Alert and oriented. Moves all 4 extremities  Psychiatry: Judgement and insight appear normal. Mood & affect appropriate.     Data Reviewed: I have personally reviewed following labs and imaging studies  CBC: Recent Labs  Lab 08/15/19 0741 08/16/19 0509 08/18/19 0749 08/19/19 0433 08/21/19 0435  WBC 33.2* 37.2* 33.6* 23.7* 28.7*  NEUTROABS 24.5* 27.0*  --  15.8* 19.6*  HGB 9.3* 8.7* 9.7* 9.2* 10.5*  HCT 28.7* 25.8* 29.2* 27.4* 32.0*  MCV 88.9 85.1 86.6 86.2 87.7  PLT 585* 517* 472* 435* [id number]   Basic Metabolic Panel: Recent Labs  Lab 08/15/19 0741 08/16/19 0509 08/18/19 0749 08/19/19 0433 08/19/19 1528 08/19/19 1946  08/20/19 0424 08/21/19 0435  NA 137 138 135 132*  --  129* 134* 134*  K 3.9 4.2 4.7 5.5* 6.4* 5.8* 5.1 5.1  CL 105 106 104 103  --  98 101 102  CO2 23 24 21* 20*  --  21* 26 24  GLUCOSE 101* 88 75 78  --  86 87 83  BUN 40* 32* 30* 30*  --  32* 27* 26*  CREATININE 1.09 1.15 1.36* 1.33*  --  1.65* 1.40* 1.30*  CALCIUM 8.1* 7.9* 8.3* 8.4*  --  8.6* 8.7* 9.0  MG 2.1 2.1  --   --   --   --   --  2.0  PHOS 2.9  --   --   --   --   --   --   --    GFR: Estimated Creatinine  Clearance: 42.4 mL/min (A) (by C-G formula based on SCr of 1.3 mg/dL (H)). Liver Function Tests: Recent Labs  Lab 08/15/19 0741  ALBUMIN 1.8*   No results for input(s): LIPASE, AMYLASE in the last 168 hours. No results for input(s): AMMONIA in the last 168 hours. Coagulation Profile: No results for input(s): INR, PROTIME in the last 168 hours. Cardiac Enzymes: No results for input(s): CKTOTAL, CKMB, CKMBINDEX, TROPONINI in the last 168 hours. BNP (last 3 results) No results for input(s): PROBNP in the last 8760 hours. HbA1C: No results for input(s): HGBA1C in the last 72 hours. CBG: Recent Labs  Lab 08/20/19 0745 08/20/19 1114 08/20/19 1542 08/20/19 2202 08/21/19 0713  GLUCAP 71 162* 123* 115* 80   Lipid Profile: No results for input(s): CHOL, HDL, LDLCALC, TRIG, CHOLHDL, LDLDIRECT in the last 72 hours. Thyroid Function Tests: No results for input(s): TSH, T4TOTAL, FREET4, T3FREE, THYROIDAB in the last 72 hours. Anemia Panel: No results for input(s): VITAMINB12, FOLATE, FERRITIN, TIBC, IRON, RETICCTPCT in the last 72 hours. Sepsis Labs: No results for input(s): PROCALCITON, LATICACIDVEN in the last 168 hours.  Recent Results (from the past 240 hour(s))  SARS CORONAVIRUS 2 (TAT 6-24 HRS) Nasopharyngeal Nasopharyngeal Swab     Status: None   Collection Time: 08/11/19 10:00 AM   Specimen: Nasopharyngeal Swab  Result Value Ref Range Status   SARS Coronavirus 2 NEGATIVE NEGATIVE Final    Comment: (NOTE) SARS-CoV-2 target nucleic acids are NOT DETECTED. The SARS-CoV-2 RNA is generally detectable in upper and lower respiratory specimens during the acute phase of infection. Negative results do not preclude SARS-CoV-2 infection, do not rule out co-infections with other pathogens, and should not be used as the sole basis for treatment or other patient management decisions. Negative results must be combined with clinical observations, patient history, and epidemiological  information. The expected result is Negative. Fact Sheet for Patients: SugarRoll.be Fact Sheet for Healthcare Providers: https://www.woods-mathews.com/ This test is not yet approved or cleared by the [place] FDA and  has been authorized for detection and/or diagnosis of SARS-CoV-2 by FDA under an Emergency Use Authorization (EUA). This EUA will remain  in effect (meaning this test can be used) for the duration of the COVID-19 declaration under Section 56 4(b)(1) of the Act, 21 U.S.C. section 360bbb-3(b)(1), unless the authorization is terminated or revoked sooner. Performed at [facility], [place] [address] [place], [place] [address]   Blood Culture (routine x 2)     Status: None   Collection Time: 08/11/19 10:03 AM   Specimen: BLOOD  Result Value Ref Range Status   Specimen Description BLOOD LEFT HAND  Final   Special Requests   Final    BOTTLES DRAWN AEROBIC AND ANAEROBIC Blood Culture adequate volume   Culture   Final    NO GROWTH 5 DAYS Performed at [facility], [address] [place], [place] [address]    Report Status 08/16/2019 FINAL  Final  Blood Culture (routine x 2)     Status: None   Collection Time: 08/11/19 10:31 AM   Specimen: BLOOD  Result Value Ref Range Status   Specimen Description BLOOD BLOOD RIGHT FOREARM  Final   Special Requests   Final    BOTTLES DRAWN AEROBIC AND ANAEROBIC Blood Culture adequate volume   Culture   Final    NO GROWTH 5 DAYS Performed at [facility], [address] [place], [place] [address]    Report Status 08/16/2019 FINAL  Final  MRSA PCR Screening     Status: Abnormal   Collection Time: 08/12/19  2:04 AM   Specimen: Nasopharyngeal  Result Value Ref Range Status   MRSA by PCR POSITIVE (A) NEGATIVE Final    Comment:        The GeneXpert MRSA Assay (FDA approved for NASAL specimens only), is one component of a comprehensive MRSA colonization surveillance  program. It is not intended to diagnose MRSA infection nor to guide or monitor treatment for MRSA infections. RESULT CALLED TO, READ BACK BY AND VERIFIED WITH: Tia Masker RN [phone] 08/12/2019 HNM Performed at [facility], [address] [place],  [address]   Aspergillus Ag, BAL/Serum     Status: None   Collection Time: 08/16/19  5:10 AM  Result Value Ref Range Status   Aspergillus Ag, BAL/Serum 0.03 0.00 - 0.49 Index Final    Comment: (NOTE) Performed At: [facility] [address] [place], [place] [id number] Rush Farmer MD [id number] Performed At: [facility] [address] [place], [place] [id number] Katina Degree MDPhD [id number]          Radiology Studies: No results found.      Scheduled Meds: . amLODipine  2.5 mg Oral Daily  . Chlorhexidine Gluconate Cloth  6 each Topical Daily  . cholecalciferol  2,000 Units Oral Daily  . clopidogrel  75 mg Oral Daily  . docusate sodium  100 mg Oral BID  . enoxaparin (LOVENOX) injection  40 mg Subcutaneous Daily  . finasteride  5 mg Oral Daily  . insulin aspart  0-15 Units Subcutaneous TID WC  . insulin aspart  0-5 Units Subcutaneous QHS  . ipratropium-albuterol  3 mL Inhalation Q4H  . levothyroxine  112.5 mcg Oral Once per day on Thu  . levothyroxine  75 mcg Oral Once per day on Sun Mon Tue Wed Fri Sat  . metoprolol tartrate  12.5 mg Oral BID  . multivitamin with minerals  1 tablet Oral Daily  . omega-3 acid ethyl esters  1 capsule Oral Daily  . pantoprazole  40 mg Oral BID  . polyethylene glycol  17 g Oral Daily  . pravastatin  40 mg Oral q1800  . predniSONE  20 mg Oral Q breakfast  . rOPINIRole  0.25 mg Oral QHS  . tamsulosin  0.4 mg Oral Daily  . vitamin C  500 mg Oral Daily   Continuous Infusions:   LOS: 10 days    Time spent: 30 mins    Wyvonnia Dusky, MD [facility] Pager [phone]  If 7PM-7AM, please contact night-coverage [email or web address]  Password [email or web address] 08/21/2019, 9:08 AM

## 2019-08-21 NOTE — Telephone Encounter (Signed)
Ryan Pittman says they sat him up for a good while yesterday and he tolerated it well.  He wanted to try to walk but the nurses wouldn't let him do that.  Wife says he is very short of breath and is still in ICU but they did insert a smaller tube (? What kind of tube) and said they are trying to see if he can tolerate that.

## 2019-08-21 NOTE — Evaluation (Signed)
Occupational Therapy Evaluation Patient Details Name: Ryan Pittman MRN: [id number] DOB: 1940-11-01 Today's Date: 08/21/2019    History of Present Illness Pt is an 78 y.o. male presenting to hospital 08/11/19 with increased SOB and cough.  Of note, pt (+) COVID about 4 weeks prior and hospitalized 11/4-11/15 d/t COVID-19 infection and discharged home on supplemental O2.  Pt now admitted with acute hypoxic respiratory failure secondary multi-focal PNA vs pneumonitis and pulmonary edema requiring HFNC.  S/p 1 unit PRBC's 12/9.  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  PMH includes CABG, anemia, angina, CKD, gastric reflux, htn.   Clinical Impression   Pt seen for OT evaluation this date. Prior to recent hospitalization for Covid in November, pt was active and independent in all ADLs and mobility, and working full time. Since return home has been on 3L O2 and building his activity tolerance and strength back up prior to this hospitalization. Pt is eager to return to PLOF including work. Pt eager to get up and participate in therapy. Reports feeling much better. Pt performed bed mobility with supervision. With brief rest break seated EOB, pt stood with CGA using RW and took several forward/backward steps with no LOB and then sat in recliner. O2 sats decreased from 99% at rest on 8L O2 to 87%. O2 improved with cues for pursed lip breathing within 2 minutes to 93% on 8L O2. Pt able to perform seated LB dressing without difficulty, aside from need for brief rest break following. Pt demo's impairments in activity tolerance, cardiopulm status, and minor balance deficits. Pt/spouse educated in home monitoring for BP, O2. Both instructed in energy conservation strategies including pursed lip breathing and activity pacing with use of rest breaks. Pt/spouse verbalized understanding and pt would benefit from additional skilled OT services to maximize recall and carryover of learned techniques and facilitate  implementation of learned techniques into daily routines. Upon discharge, recommend [facility] services.     Follow Up Recommendations  Home health OT    Equipment Recommendations  None recommended by OT    Recommendations for Other Services       Precautions / Restrictions Precautions Precautions: Fall Precaution Comments: watch O2 sats Restrictions Weight Bearing Restrictions: No      Mobility Bed Mobility Overal bed mobility: Needs Assistance Bed Mobility: Supine to Sit     Supine to sit: Supervision     General bed mobility comments: SBA for lines; mild increased effort to perform on own  Transfers Overall transfer level: Needs assistance Equipment used: Rolling walker (2 wheeled) Transfers: Sit to/from Stand Sit to Stand: Min guard              Balance Overall balance assessment: Needs assistance Sitting-balance support: No upper extremity supported;Feet supported Sitting balance-Leahy Scale: Normal     Standing balance support: Bilateral upper extremity supported Standing balance-Leahy Scale: Fair                             ADL either performed or assessed with clinical judgement   ADL Overall ADL's : Needs assistance/impaired                                       General ADL Comments: CGA for sit to stand LB ADL, supervision to watch O2 sats during seated ADL; cues for PLB and rest breaks  Vision Baseline Vision/History: Wears glasses Wears Glasses: Reading only Patient Visual Report: No change from baseline       Perception     Praxis      Pertinent Vitals/Pain Pain Assessment: No/denies pain     Hand Dominance     Extremity/Trunk Assessment Upper Extremity Assessment Upper Extremity Assessment: Generalized weakness(at least 4/5)   Lower Extremity Assessment Lower Extremity Assessment: Generalized weakness(at least 4/5)   Cervical / Trunk Assessment Cervical / Trunk Assessment: Normal    Communication Communication Communication: No difficulties   Cognition Arousal/Alertness: Awake/alert Behavior During Therapy: WFL for tasks assessed/performed Overall Cognitive Status: Within Functional Limits for tasks assessed                                     General Comments  With several forward/backward steps with RW and CGA, O2 sats went from 99% on 8LO2 to 87%, RR briefly up to 36, once seated and with PLB, improved to 93% within 2 minutes, RR 19    Exercises Other Exercises Other Exercises: Pt/spouse educated in home vitals monitoring, energy conservation strategies, PLB   Shoulder Instructions      Home Living Family/patient expects to be discharged to:: Private residence Living Arrangements: Spouse/significant other Available Help at Discharge: Family Type of Home: House Home Access: Stairs to enter [employer] of Steps: 1 step (no railing) to enter from back; 3-4 steps with B railing to enter from front   Home Layout: Two level;Able to live on main level with bedroom/bathroom     Bathroom Shower/Tub: Occupational psychologist: Standard         Additional Comments: Supplemental O2      Prior Functioning/Environment Level of Independence: Independent        Comments: pt active and independent, owns a glass shop, walks a lot; since [facility] admission, pt hoe on 3L O2        OT Problem List: Decreased activity tolerance;Cardiopulmonary status limiting activity;Impaired balance (sitting and/or standing);Decreased knowledge of use of DME or AE      OT Treatment/Interventions: Self-care/ADL training;Therapeutic exercise;Therapeutic activities;Energy conservation;DME and/or AE instruction;Patient/family education;Balance training    OT Goals(Current goals can be found in the care plan section) Acute Rehab OT Goals Patient Stated Goal: to go home OT Goal Formulation: With patient/family Time For Goal Achievement:  09/04/19 Potential to Achieve Goals: Good ADL Goals Pt Will Transfer to Toilet: with supervision;ambulating;bedside commode(LRAD for amb, O2 sats >90% t/o) Additional ADL Goal #1: Pt/spouse will verbalize plan to implement at least 2 learned energy conservation strategies for ADL/IADL to minimize SOB/over exertion and maximize safety/independence  OT Frequency: Min 1X/week   Barriers to D/C:            Co-evaluation              AM-PAC OT "6 Clicks" Daily Activity     Outcome Measure Help from another person eating meals?: None Help from another person taking care of personal grooming?: None Help from another person toileting, which includes using toliet, bedpan, or urinal?: A Little Help from another person bathing (including washing, rinsing, drying)?: A Little Help from another person to put on and taking off regular upper body clothing?: None Help from another person to put on and taking off regular lower body clothing?: A Little 6 Click Score: 21   End of Session Equipment Utilized During Treatment: Gait  belt;Rolling walker;Oxygen Nurse Communication: Mobility status  Activity Tolerance: Patient tolerated treatment well Patient left: in chair;with call bell/phone within reach;with family/visitor present  OT Visit Diagnosis: Other abnormalities of gait and mobility (R26.89);Muscle weakness (generalized) (M62.81)                Time: [id number] OT Time Calculation (min): 35 min Charges:  OT General Charges $OT Visit: 1 Visit OT Evaluation $OT Eval Low Complexity: 1 Low OT Treatments $Self Care/Home Management : 8-22 mins $Therapeutic Activity: 8-22 mins  Jeni Salles, MPH, MS, OTR/L ascom [phone] 08/21/19, 1:30 PM

## 2019-08-21 NOTE — Progress Notes (Signed)
POST COVID HYPOXIC RESP FAILURE CONTINUE PREDNISONE FLUTTER VALVE AND INCENTIVE SPIROMETRY OXYGEN AS NEEDED >88% PT/OT  WILL SIGN OFF AT THIS TIME PLEASE CALL BACK WITH ANY QUESTIONS

## 2019-08-21 NOTE — Progress Notes (Signed)
Physical Therapy Treatment Patient Details Name: Ryan Pittman MRN: [id number] DOB: 1941/08/29 Today's Date: 08/21/2019    History of Present Illness Ryan Pittman is an 78yo male presenting to hospital 08/11/19 with increased SOB and cough.  Of note, pt (+) COVID about 4 weeks prior and hospitalized 11/4-11/15 d/t COVID-19 infection and discharged home on supplemental O2.  Pt now admitted with acute hypoxic respiratory failure secondary multi-focal PNA vs pneumonitis and pulmonary edema requiring HFNC.  S/p 1 unit PRBC's 12/9.  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  PMH includes CABG, anemia, angina, CKD, gastric reflux, htn.    PT Comments    Pt in recliner upon arrival, motivated to get up and AMB this date. Pt recevied on 7LHFNC satting WNL, but immediately desaturated with short distance AMB on 8L/min. Pt moved to 15L/min on tank for AMB, able to gradually cover <361ft, but SpO2 necessitating standing recovery breaks q 60-28ft 2/2 desaturation to 81%, able to recover over 15-30sec. Toward end of session, recovery time begins to lengthen, hence AMB activity is ended. Pt able to participate in exercises in room thereafter for several minutes. Pt progressing well in general, still requires high flow O2 for household distance AMB.      Follow Up Recommendations  Home health PT;Other (comment)(may require SNF depending on ability to wean flow rate)     Equipment Recommendations  Rolling walker with 5" wheels;3in1 (PT);Other (comment)(transport chair)    Recommendations for Other Services OT consult     Precautions / Restrictions Precautions Precautions: Fall Precaution Comments: watch O2 sats Restrictions Weight Bearing Restrictions: No    Mobility  Bed Mobility Overal bed mobility: Needs Assistance Bed Mobility: Supine to Sit     Supine to sit: Supervision     General bed mobility comments: pt up to chair upon entry`  Transfers Overall transfer level: Needs  assistance Equipment used: Rolling walker (2 wheeled);None Transfers: Sit to/from Stand Sit to Stand: Min guard         General transfer comment: performs 2 with RW for support/balance, then later able to do 5x without AD, but uses BUE on chair arms for assist  Ambulation/Gait Ambulation/Gait assistance: Min guard Gait Distance (Feet): 250 Feet Assistive device: Rolling walker (2 wheeled) Gait Pattern/deviations: Step-to pattern Gait velocity: unable to assess this date   General Gait Details: author drives O2 tank, Pt c RW; Pt on 8LHFNC initially, but desats to 81% within 49ft; PT moved to 15L/min HFNC, able to AMB, but desat to 81% after 60-53ft, requiring standing recovery breaks each interval for 15-30sec recovery to >90%. Pt tolerating well.   Stairs             Wheelchair Mobility    Modified Rankin (Stroke Patients Only)       Balance Overall balance assessment: Mild deficits observed, not formally tested;Modified Independent Sitting-balance support: No upper extremity supported;Feet supported Sitting balance-Leahy Scale: Normal     Standing balance support: No upper extremity supported;During functional activity Standing balance-Leahy Scale: Good                              Cognition Arousal/Alertness: Awake/alert Behavior During Therapy: WFL for tasks assessed/performed Overall Cognitive Status: Within Functional Limits for tasks assessed  Exercises Other Exercises Other Exercises: Pt/spouse educated in home vitals monitoring, energy conservation strategies, PLB    General Comments General comments (skin integrity, edema, etc.): With several forward/backward steps with RW and CGA, O2 sats went from 99% on 8LO2 to 87%, RR briefly up to 36, once seated and with PLB, improved to 93% within 2 minutes, RR 19      Pertinent Vitals/Pain Pain Assessment: No/denies pain    Home Living                       Prior Function            PT Goals (current goals can now be found in the care plan section) Acute Rehab PT Goals Patient Stated Goal: to go home PT Goal Formulation: With patient Time For Goal Achievement: 09/03/19 Potential to Achieve Goals: Fair Progress towards PT goals: Progressing toward goals    Frequency    Min 2X/week      PT Plan Discharge plan needs to be updated    Co-evaluation              AM-PAC PT "6 Clicks" Mobility   Outcome Measure  Help needed turning from your back to your side while in a flat bed without using bedrails?: None Help needed moving from lying on your back to sitting on the side of a flat bed without using bedrails?: A Little Help needed moving to and from a bed to a chair (including a wheelchair)?: A Little Help needed standing up from a chair using your arms (e.g., wheelchair or bedside chair)?: A Little Help needed to walk in hospital room?: A Little Help needed climbing 3-5 steps with a railing? : A Little 6 Click Score: 19    End of Session Equipment Utilized During Treatment: Gait belt;Oxygen Activity Tolerance: Patient tolerated treatment well;Treatment limited secondary to medical complications (Comment) Patient left: in chair;with call bell/phone within reach;with family/visitor present Nurse Communication: Mobility status;Precautions PT Visit Diagnosis: Other abnormalities of gait and mobility (R26.89);Unsteadiness on feet (R26.81);Muscle weakness (generalized) (M62.81);Difficulty in walking, not elsewhere classified (R26.2)     Time: [id number] PT Time Calculation (min) (ACUTE ONLY): 29 min  Charges:  $Therapeutic Exercise: 23-37 mins                     4:33 PM, 08/21/19 Etta Grandchild, PT, DPT Physical Therapist - [facility]  [phone] ([place])    Rudean Icenhour C 08/21/2019, 4:29 PM

## 2019-08-22 LAB — GLUCOSE, CAPILLARY
Glucose-Capillary: 91 mg/dL (ref 70–99)
Glucose-Capillary: 115 mg/dL — ABNORMAL HIGH (ref 70–99)
Glucose-Capillary: 298 mg/dL — ABNORMAL HIGH (ref 70–99)
Glucose-Capillary: 124 mg/dL — ABNORMAL HIGH (ref 70–99)

## 2019-08-22 LAB — CBC
HCT: 30.7 % — ABNORMAL LOW (ref 39.0–52.0)
Hemoglobin: 10.3 g/dL — ABNORMAL LOW (ref 13.0–17.0)
MCH: 29.1 pg (ref 26.0–34.0)
MCHC: 33.6 g/dL (ref 30.0–36.0)
MCV: 86.7 fL (ref 80.0–100.0)
Platelets: 321 10*3/uL (ref 150–400)
RBC: 3.54 MIL/uL — ABNORMAL LOW (ref 4.22–5.81)
RDW: 19.5 % — ABNORMAL HIGH (ref 11.5–15.5)
WBC: 31.5 10*3/uL — ABNORMAL HIGH (ref 4.0–10.5)
nRBC: 0.1 % (ref 0.0–0.2)

## 2019-08-22 LAB — BASIC METABOLIC PANEL
Anion gap: 8 (ref 5–15)
BUN: 39 mg/dL — ABNORMAL HIGH (ref 8–23)
CO2: 23 mmol/L (ref 22–32)
Calcium: 8.7 mg/dL — ABNORMAL LOW (ref 8.9–10.3)
Chloride: 102 mmol/L (ref 98–111)
Creatinine, Ser: 1.53 mg/dL — ABNORMAL HIGH (ref 0.61–1.24)
GFR calc Af Amer: 49 mL/min — ABNORMAL LOW (ref >60)
GFR calc non Af Amer: 42 mL/min — ABNORMAL LOW (ref >60)
Glucose, Bld: 89 mg/dL (ref 70–99)
Potassium: 4.8 mmol/L (ref 3.5–5.1)
Sodium: 133 mmol/L — ABNORMAL LOW (ref 135–145)

## 2019-08-22 MED ORDER — IPRATROPIUM-ALBUTEROL 0.5-2.5 (3) MG/3ML IN SOLN
3.0000 mL | Freq: Four times a day (QID) | RESPIRATORY_TRACT | Status: DC
  Administered 2019-08-23 – 2019-08-25 (×9): 3 mL via RESPIRATORY_TRACT
  Filled 2019-08-22 (×8): qty 3

## 2019-08-22 NOTE — Progress Notes (Signed)
Physical Therapy Treatment Patient Details Name: Ryan Pittman MRN: [id number] DOB: 1940/09/28 Today's Date: 08/22/2019    History of Present Illness Ryan Pittman is an 78yo male presenting to hospital 08/11/19 with increased SOB and cough.  Of note, pt (+) COVID about 4 weeks prior and hospitalized 11/4-11/15 d/t COVID-19 infection and discharged home on supplemental O2.  Pt now admitted with acute hypoxic respiratory failure secondary multi-focal PNA vs pneumonitis and pulmonary edema requiring HFNC.  S/p 1 unit PRBC's 12/9.  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  PMH includes CABG, anemia, angina, CKD, gastric reflux, htn.    PT Comments    Pt resting in recliner upon PT arrival on 5 L O2 via nasal cannula.  Nurse reports pt ambulated around nursing station earlier this afternoon (pt desaturated into 80's towards end of ambulation on 6 L).  Switched pt to 6 L O2 for ambulation.  Pt able to stand and walk to doorway (no AD) but pt appearing SOB and O2 noted to be 87% on 6 L O2 via nasal cannula so pt ambulated back to recliner and O2 sats decreased to 84%; with sitting rest and vc's for pursed lip breathing pt's O2 sats gradually increased to 94% on 6 L and then pt switched back to 5 L O2 via nasal cannula with O2 sats 93% end of session (nurse notified of pt's O2 during session).  Pt also briefly c/o feeling off when initially standing and walking (pt's BP noted to be 95/56 earlier this afternoon) but pt reporting symptoms resolved with ambulation and sitting rest in recliner (nurse notified).  Will continue to focus on strengthening and progressive functional mobility per pt tolerance.    Follow Up Recommendations  Home health PT     Equipment Recommendations  Rolling walker with 5" wheels;3in1 (PT)    Recommendations for Other Services OT consult     Precautions / Restrictions Precautions Precautions: Fall Precaution Comments: watch O2 sats Restrictions Weight Bearing  Restrictions: No    Mobility  Bed Mobility               General bed mobility comments: Deferred (pt up in chair at beginning/end of session)  Transfers Overall transfer level: Needs assistance Equipment used: None Transfers: Sit to/from Stand Sit to Stand: Min guard         General transfer comment: fairly strong stand with B UE pushing off of arms of chair  Ambulation/Gait Ambulation/Gait assistance: Min guard Gait Distance (Feet): 60 Feet Assistive device: None       General Gait Details: step through gait pattern; steady; limited distance d/t SOB and O2 desaturation   Stairs             Wheelchair Mobility    Modified Rankin (Stroke Patients Only)       Balance Overall balance assessment: Needs assistance Sitting-balance support: No upper extremity supported;Feet supported   Sitting balance - Comments: steady sitting reaching outside BOS   Standing balance support: No upper extremity supported;During functional activity Standing balance-Leahy Scale: Good Standing balance comment: steady with ambulation (no AD)                            Cognition Arousal/Alertness: Awake/alert Behavior During Therapy: WFL for tasks assessed/performed Overall Cognitive Status: Within Functional Limits for tasks assessed  Exercises  Educated pt and pt's wife on pursed lip breathing, pacing, and activity modification.    General Comments   Nursing cleared pt for participation in physical therapy.  Pt agreeable to PT session.      Pertinent Vitals/Pain Pain Assessment: No/denies pain    Home Living                      Prior Function            PT Goals (current goals can now be found in the care plan section) Acute Rehab PT Goals Patient Stated Goal: to go home PT Goal Formulation: With patient Time For Goal Achievement: 09/03/19 Potential to Achieve Goals: Fair Progress  towards PT goals: Progressing toward goals    Frequency    Min 2X/week      PT Plan Current plan remains appropriate    Co-evaluation              AM-PAC PT "6 Clicks" Mobility   Outcome Measure  Help needed turning from your back to your side while in a flat bed without using bedrails?: None Help needed moving from lying on your back to sitting on the side of a flat bed without using bedrails?: None Help needed moving to and from a bed to a chair (including a wheelchair)?: A Little Help needed standing up from a chair using your arms (e.g., wheelchair or bedside chair)?: A Little Help needed to walk in hospital room?: A Little Help needed climbing 3-5 steps with a railing? : A Little 6 Click Score: 20    End of Session Equipment Utilized During Treatment: Gait belt;Oxygen Activity Tolerance: Other (comment)(Limited d/t O2 desaturation and SOB) Patient left: in chair;with call bell/phone within reach;with chair alarm set;with family/visitor present Nurse Communication: Mobility status;Precautions;Other (comment)(Pt's O2 sats during session) PT Visit Diagnosis: Other abnormalities of gait and mobility (R26.89);Unsteadiness on feet (R26.81);Muscle weakness (generalized) (M62.81);Difficulty in walking, not elsewhere classified (R26.2)     Time: [id number] PT Time Calculation (min) (ACUTE ONLY): 16 min  Charges:  $Therapeutic Exercise: 8-22 mins                     Leitha Bleak, PT 08/22/19, 4:56 PM

## 2019-08-22 NOTE — TOC Progression Note (Signed)
Transition of Care [facility]) - Progression Note    Patient Details  Name: Ryan Pittman MRN: [id number] Date of Birth: 1941-01-01  [facility] CM/SW Contact  Su Hilt, RN Phone Number: 08/22/2019, 4:50 PM  Clinical Narrative:     Spoke to the patient and his wife on the phone they would like to use [facility], notified  Sheron Nightingale with [employer] is working with the patient on Oxygen and possible NIV  Expected Discharge Plan: [place] Barriers to Discharge: Continued Medical Work up  Expected Discharge Plan and Services Expected Discharge Plan: [place] arrangements for the past 2 months: Single Family Home                                       Social Determinants of Health (SDOH) Interventions    Readmission Risk Interventions Readmission Risk Prevention Plan 08/15/2019 07/15/2019  Transportation Screening Complete Complete  Palliative Care Screening Not Applicable -  Medication Review (RN Care Manager) Complete Complete  Some recent data might be hidden

## 2019-08-22 NOTE — Care Management Important Message (Signed)
Important Message  Patient Details  Name: Ryan Pittman MRN: [id number] Date of Birth: 1940-09-11   Medicare Important Message Given:  Yes     Dannette Barbara 08/22/2019, 1:47 PM

## 2019-08-22 NOTE — Telephone Encounter (Signed)
Duly noted.  I will await the inpatient notes.  Thanks.

## 2019-08-22 NOTE — Progress Notes (Signed)
PROGRESS NOTE    Ryan Pittman  [id number] DOB: 1941/07/28 DOA: 08/11/2019 PCP: Tonia Ghent, MD     Assessment & Plan:   Principal Problem:   Multifocal pneumonia Active Problems:   CKD (chronic kidney disease) stage 3, GFR 30-59 ml/min   BPH with obstruction/lower urinary tract symptoms   HTN (hypertension)   Hyperlipidemia   Hypothyroidism   PAF (paroxysmal atrial fibrillation) (HCC)   CAD (coronary artery disease)   Acute kidney injury superimposed on CKD ([place])   Pneumonia due to COVID-19 virus   Normocytic anemia   Acute respiratory disease due to COVID-19 virus   Acute on chronic respiratory failure with hypoxia (HCC)  Acute hypoxic respiratory failure: continues to improve daily. Likely multifactorial. 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 Continue on supplemental oxygen and wean back to baseline as tolerated, currently on 6L [place]  Pneumonia: multifocal lobar. Likely secondary to [place]. Abx d/c as per ICU team  Leukocytosis: infection vs steroid use. Will continue to monitor   Essential hypertension: continue metoprolol, amlodipine  AKI on CKD: Cr is trending up today. Avoid nephroxtoxins. Will continue to monitor   Hyperkalemia: resolved. Will continue to monitor  Paroxysmal atrial fibrillation: continue on metoprolol.  Amiodarone on hold with abnormal CT scan of the chest.  Iron deficiency anemia: s/p 1 unit of packed red blood cells and iron during the hospital course. H&H are stable. No need for a transfusion currently  Hypothyroidism: continue on levothyroxine  BPH: continue on Flomax, Proscar   Restless leg syndrome: continue on Requip  Weakness: PT recommends LTAC, OT recommends home health OT. CM aware     DVT prophylaxis: lovenox Code Status: full  Family Communication:  Disposition Plan:    Consultants:   ICU   Antimicrobials: n/a   Subjective: Pt c/o fatigue.    Objective: Vitals:   08/21/19 1955 08/21/19 2110 08/21/19 2348 08/22/19 0719  BP:  (!) 129/54 (!) 116/57   Pulse:  80 76   Resp:   18   Temp:   97.8 F (36.6 C)   TempSrc:   Oral   SpO2: 97%  98% 92%  Weight:      Height:        Intake/Output Summary (Last 24 hours) at 08/22/2019 0734 Last data filed at 08/22/2019 0524 Gross per 24 hour  Intake 480 ml  Output 1000 ml  Net -520 ml   Filed Weights   08/11/19 0958  Weight: 68 kg    Examination:  General exam: Appears calm and comfortable  Respiratory system: diminished breath sounds b/l. No rales Cardiovascular system: S1 & S2 normal. No rubs, gallops or clicks.  Gastrointestinal system: Abdomen is nondistended, soft and nontender.  Normal bowel sounds heard. Central nervous system: Alert and oriented. Moves all 4 extremities  Psychiatry: Judgement and insight appear normal. Mood & affect appropriate.     Data Reviewed: I have personally reviewed following labs and imaging studies  CBC: Recent Labs  Lab 08/15/19 0741 08/16/19 0509 08/18/19 0749 08/19/19 0433 08/21/19 0435 08/22/19 0512  WBC 33.2* 37.2* 33.6* 23.7* 28.7* 31.5*  NEUTROABS 24.5* 27.0*  --  15.8* 19.6*  --   HGB 9.3* 8.7* 9.7* 9.2* 10.5* 10.3*  HCT 28.7* 25.8* 29.2* 27.4* 32.0* 30.7*  MCV 88.9 85.1 86.6 86.2 87.7 86.7  PLT 585* 517* 472* 435* 359 [id number]   Basic Metabolic Panel: Recent Labs  Lab 08/15/19 0741 08/16/19 0509 08/19/19 0433 08/19/19 1528 08/19/19  1946 08/20/19 0424 08/21/19 0435 08/22/19 0512  NA 137 138 132*  --  129* 134* 134* 133*  K 3.9 4.2 5.5* 6.4* 5.8* 5.1 5.1 4.8  CL 105 106 103  --  98 101 102 102  CO2 23 24 20*  --  21* 26 24 23   GLUCOSE 101* 88 78  --  86 87 83 89  BUN 40* 32* 30*  --  32* 27* 26* 39*  CREATININE 1.09 1.15 1.33*  --  1.65* 1.40* 1.30* 1.53*  CALCIUM 8.1* 7.9* 8.4*  --  8.6* 8.7* 9.0 8.7*  MG 2.1 2.1  --   --   --   --  2.0  --   PHOS 2.9  --   --   --   --   --   --   --    GFR: Estimated  Creatinine Clearance: 36 mL/min (A) (by C-G formula based on SCr of 1.53 mg/dL (H)). Liver Function Tests: Recent Labs  Lab 08/15/19 0741  ALBUMIN 1.8*   No results for input(s): LIPASE, AMYLASE in the last 168 hours. No results for input(s): AMMONIA in the last 168 hours. Coagulation Profile: No results for input(s): INR, PROTIME in the last 168 hours. Cardiac Enzymes: No results for input(s): CKTOTAL, CKMB, CKMBINDEX, TROPONINI in the last 168 hours. BNP (last 3 results) No results for input(s): PROBNP in the last 8760 hours. HbA1C: No results for input(s): HGBA1C in the last 72 hours. CBG: Recent Labs  Lab 08/20/19 2202 08/21/19 0713 08/21/19 1136 08/21/19 1646 08/21/19 2103  GLUCAP 115* 80 150* 218* 76   Lipid Profile: No results for input(s): CHOL, HDL, LDLCALC, TRIG, CHOLHDL, LDLDIRECT in the last 72 hours. Thyroid Function Tests: No results for input(s): TSH, T4TOTAL, FREET4, T3FREE, THYROIDAB in the last 72 hours. Anemia Panel: No results for input(s): VITAMINB12, FOLATE, FERRITIN, TIBC, IRON, RETICCTPCT in the last 72 hours. Sepsis Labs: No results for input(s): PROCALCITON, LATICACIDVEN in the last 168 hours.  Recent Results (from the past 240 hour(s))  Aspergillus Ag, BAL/Serum     Status: None   Collection Time: 08/16/19  5:10 AM  Result Value Ref Range Status   Aspergillus Ag, BAL/Serum 0.03 0.00 - 0.49 Index Final    Comment: (NOTE) Performed At: [facility] [place], [place] [id number] Rush Farmer MD [id number] Performed At: [facility] [address] [place], [place] [id number] Katina Degree MDPhD [id number]          Radiology Studies: No results found.      Scheduled Meds: . amLODipine  2.5 mg Oral Daily  . Chlorhexidine Gluconate Cloth  6 each Topical Daily  . cholecalciferol  2,000 Units Oral Daily  . clopidogrel  75 mg Oral Daily  . docusate sodium  100 mg Oral BID  . enoxaparin (LOVENOX) injection   40 mg Subcutaneous Daily  . finasteride  5 mg Oral Daily  . insulin aspart  0-15 Units Subcutaneous TID WC  . insulin aspart  0-5 Units Subcutaneous QHS  . ipratropium-albuterol  3 mL Inhalation Q4H  . levothyroxine  112.5 mcg Oral Once per day on Thu  . levothyroxine  75 mcg Oral Once per day on Sun Mon Tue Wed Fri Sat  . metoprolol tartrate  12.5 mg Oral BID  . multivitamin with minerals  1 tablet Oral Daily  . omega-3 acid ethyl esters  1 capsule Oral Daily  . pantoprazole  40 mg Oral BID  . polyethylene glycol  17 g Oral Daily  . pravastatin  40 mg Oral q1800  . predniSONE  20 mg Oral Q breakfast  . rOPINIRole  0.25 mg Oral QHS  . tamsulosin  0.4 mg Oral Daily  . vitamin C  500 mg Oral Daily   Continuous Infusions:   LOS: 11 days    Time spent: 30 mins    Wyvonnia Dusky, MD [facility] Pager [phone]  If 7PM-7AM, please contact night-coverage [email or web address] Password [facility] 08/22/2019, 7:33 AM

## 2019-08-23 LAB — CBC
HCT: 29.6 % — ABNORMAL LOW (ref 39.0–52.0)
Hemoglobin: 9.7 g/dL — ABNORMAL LOW (ref 13.0–17.0)
MCH: 29.4 pg (ref 26.0–34.0)
MCHC: 32.8 g/dL (ref 30.0–36.0)
MCV: 89.7 fL (ref 80.0–100.0)
Platelets: 277 10*3/uL (ref 150–400)
RBC: 3.3 MIL/uL — ABNORMAL LOW (ref 4.22–5.81)
RDW: 19.3 % — ABNORMAL HIGH (ref 11.5–15.5)
WBC: 31.3 10*3/uL — ABNORMAL HIGH (ref 4.0–10.5)
nRBC: 0.2 % (ref 0.0–0.2)

## 2019-08-23 LAB — FUNGITELL, SERUM: Fungitell Result: 106 pg/mL — ABNORMAL HIGH (ref <80)

## 2019-08-23 LAB — BASIC METABOLIC PANEL
Anion gap: 8 (ref 5–15)
BUN: 45 mg/dL — ABNORMAL HIGH (ref 8–23)
CO2: 24 mmol/L (ref 22–32)
Calcium: 8.5 mg/dL — ABNORMAL LOW (ref 8.9–10.3)
Chloride: 103 mmol/L (ref 98–111)
Creatinine, Ser: 1.47 mg/dL — ABNORMAL HIGH (ref 0.61–1.24)
GFR calc Af Amer: 51 mL/min — ABNORMAL LOW (ref >60)
GFR calc non Af Amer: 44 mL/min — ABNORMAL LOW (ref >60)
Glucose, Bld: 94 mg/dL (ref 70–99)
Potassium: 4.7 mmol/L (ref 3.5–5.1)
Sodium: 135 mmol/L (ref 135–145)

## 2019-08-23 LAB — GLUCOSE, CAPILLARY
Glucose-Capillary: 90 mg/dL (ref 70–99)
Glucose-Capillary: 133 mg/dL — ABNORMAL HIGH (ref 70–99)
Glucose-Capillary: 165 mg/dL — ABNORMAL HIGH (ref 70–99)
Glucose-Capillary: 157 mg/dL — ABNORMAL HIGH (ref 70–99)

## 2019-08-23 NOTE — Plan of Care (Signed)
  Problem: Clinical Measurements: Goal: Ability to maintain clinical measurements within normal limits will improve Outcome: Progressing Goal: Respiratory complications will improve Outcome: Progressing   Problem: Activity: Goal: Risk for activity intolerance will decrease Outcome: Progressing   Problem: Coping: Goal: Level of anxiety will decrease Outcome: Progressing   Problem: Safety: Goal: Ability to remain free from injury will improve Outcome: Progressing   Problem: Skin Integrity: Goal: Risk for impaired skin integrity will decrease Outcome: Progressing

## 2019-08-23 NOTE — Progress Notes (Signed)
PROGRESS NOTE    Ryan Pittman  [id number] DOB: 11-30-1940 DOA: 08/11/2019 PCP: Tonia Ghent, MD     Assessment & Plan:   Principal Problem:   Multifocal pneumonia Active Problems:   CKD (chronic kidney disease) stage 3, GFR 30-59 ml/min   BPH with obstruction/lower urinary tract symptoms   HTN (hypertension)   Hyperlipidemia   Hypothyroidism   PAF (paroxysmal atrial fibrillation) (HCC)   CAD (coronary artery disease)   Acute kidney injury superimposed on CKD ([place])   Pneumonia due to COVID-19 virus   Normocytic anemia   Acute respiratory disease due to COVID-19 virus   Acute on chronic respiratory failure with hypoxia (HCC)  Acute hypoxic respiratory failure: improving daily. Likely multifactorial. 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 Continue on supplemental oxygen and wean back to baseline as tolerated, currently on 5L [place]  Pneumonia: multifocal lobar. Likely secondary to [place]. Abx d/c as per ICU team  Leukocytosis: infection vs steroid use. Will continue to monitor   Essential hypertension: continue metoprolol, amlodipine  AKI on CKD: Cr is trending down today. Avoid nephroxtoxins. Will continue to monitor   Hyperkalemia: resolved. Will continue to monitor  Paroxysmal atrial fibrillation: continue on metoprolol.  Amiodarone on hold with abnormal CT scan of the chest.  Iron deficiency anemia: s/p 1 unit of packed red blood cells and iron during the hospital course. H&H are stable. No need for a transfusion currently  Hypothyroidism: continue on levothyroxine  BPH: continue on Flomax, Proscar   Restless leg syndrome: continue on Requip  Weakness: PT/OT now both recommend home health    DVT prophylaxis: lovenox Code Status: full  Family Communication:  Disposition Plan:    Consultants:   ICU   Antimicrobials: n/a   Subjective: Pt c/o shortness of breath  intermittently  Objective: Vitals:   08/22/19 1531 08/22/19 2027 08/22/19 2331 08/23/19 0710  BP: (!) 95/56  (!) 127/56   Pulse: 92  78   Resp: 17  19   Temp: 98.4 F (36.9 C)  97.8 F (36.6 C)   TempSrc: Oral  Oral   SpO2: 93% 94% 99% 90%  Weight:      Height:        Intake/Output Summary (Last 24 hours) at 08/23/2019 0723 Last data filed at 08/22/2019 1134 Gross per 24 hour  Intake --  Output 540 ml  Net -540 ml   Filed Weights   08/11/19 0958  Weight: 68 kg    Examination:  General exam: Appears calm and comfortable  Respiratory system: decreased breath sounds b/l. No wheezes Cardiovascular system: S1 & S2 normal. No rubs, gallops or clicks.  Gastrointestinal system: Abdomen is nondistended, soft and nontender.  Normal bowel sounds heard. Central nervous system: Alert and oriented. Moves all 4 extremities  Psychiatry: Judgement and insight appear normal. Mood & affect appropriate.     Data Reviewed: I have personally reviewed following labs and imaging studies  CBC: Recent Labs  Lab 08/18/19 0749 08/19/19 0433 08/21/19 0435 08/22/19 0512 08/23/19 0403  WBC 33.6* 23.7* 28.7* 31.5* 31.3*  NEUTROABS  --  15.8* 19.6*  --   --   HGB 9.7* 9.2* 10.5* 10.3* 9.7*  HCT 29.2* 27.4* 32.0* 30.7* 29.6*  MCV 86.6 86.2 87.7 86.7 89.7  PLT 472* 435* 359 321 [id number]   Basic Metabolic Panel: Recent Labs  Lab 08/19/19 1946 08/20/19 0424 08/21/19 0435 08/22/19 0512 08/23/19 0403  NA 129* 134* 134* 133* 135  K 5.8* 5.1 5.1 4.8 4.7  CL 98 101 102 102 103  CO2 21* 26 24 23 24   GLUCOSE 86 87 83 89 94  BUN 32* 27* 26* 39* 45*  CREATININE 1.65* 1.40* 1.30* 1.53* 1.47*  CALCIUM 8.6* 8.7* 9.0 8.7* 8.5*  MG  --   --  2.0  --   --    GFR: Estimated Creatinine Clearance: 37.5 mL/min (A) (by C-G formula based on SCr of 1.47 mg/dL (H)). Liver Function Tests: No results for input(s): AST, ALT, ALKPHOS, BILITOT, PROT, ALBUMIN in the last 168 hours. No results for input(s):  LIPASE, AMYLASE in the last 168 hours. No results for input(s): AMMONIA in the last 168 hours. Coagulation Profile: No results for input(s): INR, PROTIME in the last 168 hours. Cardiac Enzymes: No results for input(s): CKTOTAL, CKMB, CKMBINDEX, TROPONINI in the last 168 hours. BNP (last 3 results) No results for input(s): PROBNP in the last 8760 hours. HbA1C: No results for input(s): HGBA1C in the last 72 hours. CBG: Recent Labs  Lab 08/21/19 2103 08/22/19 0805 08/22/19 1133 08/22/19 1705 08/22/19 2135  GLUCAP 76 91 115* 298* 124*   Lipid Profile: No results for input(s): CHOL, HDL, LDLCALC, TRIG, CHOLHDL, LDLDIRECT in the last 72 hours. Thyroid Function Tests: No results for input(s): TSH, T4TOTAL, FREET4, T3FREE, THYROIDAB in the last 72 hours. Anemia Panel: No results for input(s): VITAMINB12, FOLATE, FERRITIN, TIBC, IRON, RETICCTPCT in the last 72 hours. Sepsis Labs: No results for input(s): PROCALCITON, LATICACIDVEN in the last 168 hours.  Recent Results (from the past 240 hour(s))  Aspergillus Ag, BAL/Serum     Status: None   Collection Time: 08/16/19  5:10 AM  Result Value Ref Range Status   Aspergillus Ag, BAL/Serum 0.03 0.00 - 0.49 Index Final    Comment: (NOTE) Performed At: [facility] [place], [place] [id number] Rush Farmer MD [id number] Performed At: [facility] [address] [place], [place] [id number] Katina Degree MDPhD [id number]          Radiology Studies: No results found.      Scheduled Meds: . amLODipine  2.5 mg Oral Daily  . Chlorhexidine Gluconate Cloth  6 each Topical Daily  . cholecalciferol  2,000 Units Oral Daily  . clopidogrel  75 mg Oral Daily  . docusate sodium  100 mg Oral BID  . enoxaparin (LOVENOX) injection  40 mg Subcutaneous Daily  . finasteride  5 mg Oral Daily  . insulin aspart  0-15 Units Subcutaneous TID WC  . insulin aspart  0-5 Units Subcutaneous QHS  . ipratropium-albuterol  3  mL Inhalation QID  . levothyroxine  112.5 mcg Oral Once per day on Thu  . levothyroxine  75 mcg Oral Once per day on Sun Mon Tue Wed Fri Sat  . metoprolol tartrate  12.5 mg Oral BID  . multivitamin with minerals  1 tablet Oral Daily  . omega-3 acid ethyl esters  1 capsule Oral Daily  . pantoprazole  40 mg Oral BID  . polyethylene glycol  17 g Oral Daily  . pravastatin  40 mg Oral q1800  . predniSONE  20 mg Oral Q breakfast  . rOPINIRole  0.25 mg Oral QHS  . tamsulosin  0.4 mg Oral Daily  . vitamin C  500 mg Oral Daily   Continuous Infusions:   LOS: 12 days    Time spent: 30 mins    Wyvonnia Dusky, MD [facility] Pager [phone]  If 7PM-7AM,  please contact night-coverage [email or web address] Password [email or web address] 08/23/2019, 7:23 AM

## 2019-08-24 LAB — CBC
HCT: 30.2 % — ABNORMAL LOW (ref 39.0–52.0)
Hemoglobin: 9.7 g/dL — ABNORMAL LOW (ref 13.0–17.0)
MCH: 29.4 pg (ref 26.0–34.0)
MCHC: 32.1 g/dL (ref 30.0–36.0)
MCV: 91.5 fL (ref 80.0–100.0)
Platelets: 251 10*3/uL (ref 150–400)
RBC: 3.3 MIL/uL — ABNORMAL LOW (ref 4.22–5.81)
RDW: 19.7 % — ABNORMAL HIGH (ref 11.5–15.5)
WBC: 29.7 10*3/uL — ABNORMAL HIGH (ref 4.0–10.5)
nRBC: 0.2 % (ref 0.0–0.2)

## 2019-08-24 LAB — GLUCOSE, CAPILLARY
Glucose-Capillary: 133 mg/dL — ABNORMAL HIGH (ref 70–99)
Glucose-Capillary: 139 mg/dL — ABNORMAL HIGH (ref 70–99)
Glucose-Capillary: 156 mg/dL — ABNORMAL HIGH (ref 70–99)
Glucose-Capillary: 150 mg/dL — ABNORMAL HIGH (ref 70–99)

## 2019-08-24 LAB — BASIC METABOLIC PANEL
Anion gap: 6 (ref 5–15)
BUN: 40 mg/dL — ABNORMAL HIGH (ref 8–23)
CO2: 27 mmol/L (ref 22–32)
Calcium: 8.7 mg/dL — ABNORMAL LOW (ref 8.9–10.3)
Chloride: 101 mmol/L (ref 98–111)
Creatinine, Ser: 1.43 mg/dL — ABNORMAL HIGH (ref 0.61–1.24)
GFR calc Af Amer: 53 mL/min — ABNORMAL LOW (ref >60)
GFR calc non Af Amer: 46 mL/min — ABNORMAL LOW (ref >60)
Glucose, Bld: 103 mg/dL — ABNORMAL HIGH (ref 70–99)
Potassium: 4.5 mmol/L (ref 3.5–5.1)
Sodium: 134 mmol/L — ABNORMAL LOW (ref 135–145)

## 2019-08-24 NOTE — Progress Notes (Signed)
PROGRESS NOTE    Ryan Pittman  [id number] DOB: 1941/01/13 DOA: 08/11/2019 PCP: Tonia Ghent, MD     Assessment & Plan:   Principal Problem:   Multifocal pneumonia Active Problems:   CKD (chronic kidney disease) stage 3, GFR 30-59 ml/min   BPH with obstruction/lower urinary tract symptoms   HTN (hypertension)   Hyperlipidemia   Hypothyroidism   PAF (paroxysmal atrial fibrillation) (HCC)   CAD (coronary artery disease)   Acute kidney injury superimposed on CKD ([place])   Pneumonia due to COVID-19 virus   Normocytic anemia   Acute respiratory disease due to COVID-19 virus   Acute on chronic respiratory failure with hypoxia (HCC)  Acute hypoxic respiratory failure: continues to improve. Likely multifactorial. 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 Continue on supplemental oxygen and wean back to baseline as tolerated, currently on 4L [place]  Pneumonia: multifocal lobar. Likely secondary to [place]. Abx d/c as per ICU team  Leukocytosis: infection vs steroid use. Trending down today. Will continue to monitor   Essential hypertension: continue metoprolol, amlodipine  AKI on CKD: Cr is slightly improved from day prior. Avoid nephroxtoxins. Will continue to monitor   Hyperkalemia: resolved. Will continue to monitor  Paroxysmal atrial fibrillation: continue on metoprolol.  Amiodarone on hold with abnormal CT scan of the chest.  Iron deficiency anemia: s/p 1 unit of packed red blood cells and iron during the hospital course. H&H are stable. No need for a transfusion currently  Hypothyroidism: continue on levothyroxine  BPH: continue on Flomax, Proscar   Restless leg syndrome: continue on Requip  Weakness: PT/OT now both recommend home health. CM is aware    DVT prophylaxis: lovenox Code Status: full  Family Communication:  Disposition Plan:    Consultants:   ICU   Antimicrobials: n/a   Subjective: Pt  c/o weakness  Objective: Vitals:   08/23/19 1715 08/23/19 1959 08/24/19 0002 08/24/19 0720  BP: 90/62  (!) 119/45   Pulse: 79  74   Resp: 20  17   Temp: 97.8 F (36.6 C)  98.2 F (36.8 C)   TempSrc: Oral     SpO2: 98% 97% 97% 94%  Weight:      Height:        Intake/Output Summary (Last 24 hours) at 08/24/2019 0725 Last data filed at 08/24/2019 0500 Gross per 24 hour  Intake 720 ml  Output 925 ml  Net -205 ml   Filed Weights   08/11/19 0958  Weight: 68 kg    Examination:  General exam: Appears calm and comfortable  Respiratory system: diminished breath sounds b/l. No wheezes Cardiovascular system: S1 & S2 normal. No rubs, gallops or clicks.  Gastrointestinal system: Abdomen is nondistended, soft and nontender.  Normal bowel sounds heard. Central nervous system: Alert and oriented. Moves all 4 extremities  Psychiatry: Judgement and insight appear normal. Mood & affect appropriate.     Data Reviewed: I have personally reviewed following labs and imaging studies  CBC: Recent Labs  Lab 08/19/19 0433 08/21/19 0435 08/22/19 0512 08/23/19 0403 08/24/19 0402  WBC 23.7* 28.7* 31.5* 31.3* 29.7*  NEUTROABS 15.8* 19.6*  --   --   --   HGB 9.2* 10.5* 10.3* 9.7* 9.7*  HCT 27.4* 32.0* 30.7* 29.6* 30.2*  MCV 86.2 87.7 86.7 89.7 91.5  PLT 435* 359 321 277 [id number]   Basic Metabolic Panel: Recent Labs  Lab 08/20/19 0424 08/21/19 0435 08/22/19 0512 08/23/19 0403 08/24/19 0402  NA  134* 134* 133* 135 134*  K 5.1 5.1 4.8 4.7 4.5  CL 101 102 102 103 101  CO2 26 24 23 24 27   GLUCOSE 87 83 89 94 103*  BUN 27* 26* 39* 45* 40*  CREATININE 1.40* 1.30* 1.53* 1.47* 1.43*  CALCIUM 8.7* 9.0 8.7* 8.5* 8.7*  MG  --  2.0  --   --   --    GFR: Estimated Creatinine Clearance: 38.5 mL/min (A) (by C-G formula based on SCr of 1.43 mg/dL (H)). Liver Function Tests: No results for input(s): AST, ALT, ALKPHOS, BILITOT, PROT, ALBUMIN in the last 168 hours. No results for input(s):  LIPASE, AMYLASE in the last 168 hours. No results for input(s): AMMONIA in the last 168 hours. Coagulation Profile: No results for input(s): INR, PROTIME in the last 168 hours. Cardiac Enzymes: No results for input(s): CKTOTAL, CKMB, CKMBINDEX, TROPONINI in the last 168 hours. BNP (last 3 results) No results for input(s): PROBNP in the last 8760 hours. HbA1C: No results for input(s): HGBA1C in the last 72 hours. CBG: Recent Labs  Lab 08/22/19 2135 08/23/19 0743 08/23/19 1150 08/23/19 1634 08/23/19 2103  GLUCAP 124* 90 133* 165* 157*   Lipid Profile: No results for input(s): CHOL, HDL, LDLCALC, TRIG, CHOLHDL, LDLDIRECT in the last 72 hours. Thyroid Function Tests: No results for input(s): TSH, T4TOTAL, FREET4, T3FREE, THYROIDAB in the last 72 hours. Anemia Panel: No results for input(s): VITAMINB12, FOLATE, FERRITIN, TIBC, IRON, RETICCTPCT in the last 72 hours. Sepsis Labs: No results for input(s): PROCALCITON, LATICACIDVEN in the last 168 hours.  Recent Results (from the past 240 hour(s))  Aspergillus Ag, BAL/Serum     Status: None   Collection Time: 08/16/19  5:10 AM  Result Value Ref Range Status   Aspergillus Ag, BAL/Serum 0.03 0.00 - 0.49 Index Final    Comment: (NOTE) Performed At: [facility] [place], [place] [id number] Rush Farmer MD [id number] Performed At: [facility] [address] [place], [place] [id number] Katina Degree MDPhD [id number]          Radiology Studies: No results found.      Scheduled Meds: . amLODipine  2.5 mg Oral Daily  . Chlorhexidine Gluconate Cloth  6 each Topical Daily  . cholecalciferol  2,000 Units Oral Daily  . clopidogrel  75 mg Oral Daily  . docusate sodium  100 mg Oral BID  . enoxaparin (LOVENOX) injection  40 mg Subcutaneous Daily  . finasteride  5 mg Oral Daily  . insulin aspart  0-15 Units Subcutaneous TID WC  . insulin aspart  0-5 Units Subcutaneous QHS  . ipratropium-albuterol   3 mL Inhalation QID  . levothyroxine  112.5 mcg Oral Once per day on Thu  . levothyroxine  75 mcg Oral Once per day on Sun Mon Tue Wed Fri Sat  . metoprolol tartrate  12.5 mg Oral BID  . multivitamin with minerals  1 tablet Oral Daily  . omega-3 acid ethyl esters  1 capsule Oral Daily  . pantoprazole  40 mg Oral BID  . polyethylene glycol  17 g Oral Daily  . pravastatin  40 mg Oral q1800  . predniSONE  20 mg Oral Q breakfast  . rOPINIRole  0.25 mg Oral QHS  . tamsulosin  0.4 mg Oral Daily  . vitamin C  500 mg Oral Daily   Continuous Infusions:   LOS: 13 days    Time spent: 30 mins    Wyvonnia Dusky, MD [facility]  Pager [phone]  If 7PM-7AM, please contact night-coverage [email or web address] Password [email or web address] 08/24/2019, 7:25 AM

## 2019-08-25 LAB — CBC
HCT: 28.7 % — ABNORMAL LOW (ref 39.0–52.0)
Hemoglobin: 9.8 g/dL — ABNORMAL LOW (ref 13.0–17.0)
MCH: 29.6 pg (ref 26.0–34.0)
MCHC: 34.1 g/dL (ref 30.0–36.0)
MCV: 86.7 fL (ref 80.0–100.0)
Platelets: 230 K/uL (ref 150–400)
RBC: 3.31 MIL/uL — ABNORMAL LOW (ref 4.22–5.81)
RDW: 19.8 % — ABNORMAL HIGH (ref 11.5–15.5)
WBC: 28.7 K/uL — ABNORMAL HIGH (ref 4.0–10.5)
nRBC: 0.2 % (ref 0.0–0.2)

## 2019-08-25 LAB — BASIC METABOLIC PANEL WITH GFR
Anion gap: 11 (ref 5–15)
BUN: 40 mg/dL — ABNORMAL HIGH (ref 8–23)
CO2: 22 mmol/L (ref 22–32)
Calcium: 8.4 mg/dL — ABNORMAL LOW (ref 8.9–10.3)
Chloride: 104 mmol/L (ref 98–111)
Creatinine, Ser: 1.26 mg/dL — ABNORMAL HIGH (ref 0.61–1.24)
GFR calc Af Amer: >60 mL/min
GFR calc non Af Amer: 54 mL/min — ABNORMAL LOW
Glucose, Bld: 108 mg/dL — ABNORMAL HIGH (ref 70–99)
Potassium: 4.5 mmol/L (ref 3.5–5.1)
Sodium: 137 mmol/L (ref 135–145)

## 2019-08-25 LAB — GLUCOSE, CAPILLARY
Glucose-Capillary: 92 mg/dL (ref 70–99)
Glucose-Capillary: 104 mg/dL — ABNORMAL HIGH (ref 70–99)
Glucose-Capillary: 152 mg/dL — ABNORMAL HIGH (ref 70–99)
Glucose-Capillary: 113 mg/dL — ABNORMAL HIGH (ref 70–99)

## 2019-08-25 MED ORDER — IPRATROPIUM-ALBUTEROL 0.5-2.5 (3) MG/3ML IN SOLN
3.0000 mL | Freq: Three times a day (TID) | RESPIRATORY_TRACT | Status: DC
  Administered 2019-08-25 – 2019-08-28 (×9): 3 mL via RESPIRATORY_TRACT
  Filled 2019-08-25 (×9): qty 3

## 2019-08-25 NOTE — Progress Notes (Signed)
Physical Therapy Treatment Patient Details Name: Ryan Pittman MRN: [id number] DOB: 1941-06-14 Today's Date: 08/25/2019    History of Present Illness Ryan Pittman is an 78 yo male presenting to hospital 08/11/19 with increased SOB and cough.  Of note, pt (+) COVID about 4 weeks prior and hospitalized 11/4-11/15 d/t COVID-19 infection and discharged home on supplemental O2.  Pt now admitted with acute hypoxic respiratory failure secondary multi-focal PNA vs pneumonitis and pulmonary edema requiring HFNC.  S/p 1 unit PRBC's 12/9.  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  PMH includes CABG, anemia, angina, CKD, gastric reflux, htn.    PT Comments    Pt resting in recliner upon PT arrival.  Pt requesting to use walker for ambulation d/t generalized weakness (pt feels more secure with walker use).  Pt's O2 sats initially 93% on 4 L O2 via nasal cannula at rest.  O2 sats then 89% or greater for first 170 feet of ambulation but then decreased down to 86% after last 90 feet of ambulation (with mild SOB noted).  Within a minute of sitting rest and pursed lip breathing, pt's O2 sats increased back up to 94% (all on 4 L O2 via nasal cannula).  Educated pt on pacing and activity modification at home (pt verbalizing appropriate understanding)  Will continue to focus on strengthening, balance, progressive ambulation with least restrictive assistive device, and trial stairs next session as appropriate.    Follow Up Recommendations  Home health PT     Equipment Recommendations  Rolling walker with 5" wheels;3in1 (PT)    Recommendations for Other Services       Precautions / Restrictions Precautions Precautions: Fall Precaution Comments: watch O2 sats Restrictions Weight Bearing Restrictions: No    Mobility  Bed Mobility               General bed mobility comments: Deferred (pt up in chair at beginning/end of session)  Transfers Overall transfer level: Needs assistance Equipment  used: Rolling walker (2 wheeled) Transfers: Sit to/from Stand Sit to Stand: Supervision         General transfer comment: fairly strong stand with B UE pushing off of arms of chair  Ambulation/Gait Ambulation/Gait assistance: Min guard;Supervision Gait Distance (Feet): 260 Feet Assistive device: Rolling walker (2 wheeled)   Gait velocity: vc's to slow down at times   General Gait Details: partial step through gait pattern   Stairs             Wheelchair Mobility    Modified Rankin (Stroke Patients Only)       Balance Overall balance assessment: Needs assistance Sitting-balance support: No upper extremity supported;Feet supported Sitting balance-Leahy Scale: Normal Sitting balance - Comments: steady sitting reaching outside BOS   Standing balance support: No upper extremity supported Standing balance-Leahy Scale: Good Standing balance comment: steady standing reaching within BOS                            Cognition Arousal/Alertness: Awake/alert Behavior During Therapy: WFL for tasks assessed/performed Overall Cognitive Status: Within Functional Limits for tasks assessed                                        Exercises      General Comments   Nursing cleared pt for participation in physical therapy.  Pt agreeable to PT session.  Pertinent Vitals/Pain  HR WFL during session's activities.    Home Living                      Prior Function            PT Goals (current goals can now be found in the care plan section) Acute Rehab PT Goals Patient Stated Goal: to go home PT Goal Formulation: With patient Time For Goal Achievement: 09/03/19 Potential to Achieve Goals: Good Progress towards PT goals: Progressing toward goals    Frequency    Min 2X/week      PT Plan Current plan remains appropriate    Co-evaluation              AM-PAC PT "6 Clicks" Mobility   Outcome Measure  Help needed  turning from your back to your side while in a flat bed without using bedrails?: None Help needed moving from lying on your back to sitting on the side of a flat bed without using bedrails?: None Help needed moving to and from a bed to a chair (including a wheelchair)?: A Little Help needed standing up from a chair using your arms (e.g., wheelchair or bedside chair)?: A Little Help needed to walk in hospital room?: A Little Help needed climbing 3-5 steps with a railing? : A Little 6 Click Score: 20    End of Session Equipment Utilized During Treatment: Gait belt;Oxygen Activity Tolerance: Patient tolerated treatment well Patient left: in chair;with call bell/phone within reach;with chair alarm set;with family/visitor present Nurse Communication: Mobility status;Precautions;Other (comment)(Pt's O2 sats during session) PT Visit Diagnosis: Other abnormalities of gait and mobility (R26.89);Unsteadiness on feet (R26.81);Muscle weakness (generalized) (M62.81);Difficulty in walking, not elsewhere classified (R26.2)     Time: [id number] PT Time Calculation (min) (ACUTE ONLY): 18 min  Charges:  $Therapeutic Exercise: 8-22 mins                     Ryan Pittman, PT 08/25/19, 4:48 PM

## 2019-08-25 NOTE — Progress Notes (Signed)
PROGRESS NOTE    Ryan Pittman  [id number] DOB: [birth year] DOA: 08/11/2019 PCP: Tonia Ghent, MD     Assessment & Plan:   Principal Problem:   Multifocal pneumonia Active Problems:   CKD (chronic kidney disease) stage 3, GFR 30-59 ml/min   BPH with obstruction/lower urinary tract symptoms   HTN (hypertension)   Hyperlipidemia   Hypothyroidism   PAF (paroxysmal atrial fibrillation) (HCC)   CAD (coronary artery disease)   Acute kidney injury superimposed on CKD ([place])   Pneumonia due to COVID-19 virus   Normocytic anemia   Acute respiratory disease due to COVID-19 virus   Acute on chronic respiratory failure with hypoxia (HCC)  Acute hypoxic respiratory failure: slowly improving. Likely multifactorial. 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 Continue on supplemental oxygen and wean back to baseline as tolerated, currently on 4L [place] still   Pneumonia: multifocal lobar. Likely secondary to [place]. Abx d/c as per ICU team  Leukocytosis: infection vs steroid use. Slowly trending down. Will continue to monitor   Essential hypertension: continue metoprolol, amlodipine  AKI on CKD: Cr is trending down today. Avoid nephroxtoxins. Will continue to monitor   Hyperkalemia: resolved. Will continue to monitor  Paroxysmal atrial fibrillation: continue on metoprolol.  Amiodarone on hold with abnormal CT scan of the chest.  Iron deficiency anemia: s/p 1 unit of packed red blood cells and iron during the hospital course. H&H are stable. No need for a transfusion currently  Hypothyroidism: continue on levothyroxine  BPH: continue on Flomax, Proscar   Restless leg syndrome: continue on Requip  Weakness: PT/OT now both recommend home health. CM is aware    DVT prophylaxis: lovenox Code Status: full  Family Communication:  Disposition Plan:    Consultants:   ICU   Antimicrobials: n/a   Subjective: Pt c/o  fatigue  Objective: Vitals:   08/24/19 1509 08/24/19 1518 08/24/19 2029 08/25/19 0103  BP:  123/63  (!) 100/47  Pulse:  69  70  Resp:    18  Temp:  97.6 F (36.4 C)  98.1 F (36.7 C)  TempSrc:  Oral  Oral  SpO2: 94% 94% 95% 98%  Weight:      Height:        Intake/Output Summary (Last 24 hours) at 08/25/2019 0714 Last data filed at 08/25/2019 [id number] Gross per 24 hour  Intake 720 ml  Output 1550 ml  Net -830 ml   Filed Weights   08/11/19 0958  Weight: 68 kg    Examination:  General exam: Appears calm and comfortable  Respiratory system: decreased breath sounds b/l. No rales Cardiovascular system: S1 & S2 normal. No rubs, gallops   Gastrointestinal system: Abdomen is nondistended, soft and nontender.  Normal bowel sounds heard. Central nervous system: Alert and oriented. Moves all 4 extremities  Psychiatry: Judgement and insight appear normal. Mood & affect appropriate.     Data Reviewed: I have personally reviewed following labs and imaging studies  CBC: Recent Labs  Lab 08/19/19 0433 08/21/19 0435 08/22/19 0512 08/23/19 0403 08/24/19 0402 08/25/19 0451  WBC 23.7* 28.7* 31.5* 31.3* 29.7* 28.7*  NEUTROABS 15.8* 19.6*  --   --   --   --   HGB 9.2* 10.5* 10.3* 9.7* 9.7* 9.8*  HCT 27.4* 32.0* 30.7* 29.6* 30.2* 28.7*  MCV 86.2 87.7 86.7 89.7 91.5 86.7  PLT 435* 359 321 277 251 [id number]   Basic Metabolic Panel: Recent Labs  Lab 08/21/19 0435 08/22/19 0512  08/23/19 0403 08/24/19 0402 08/25/19 0451  NA 134* 133* 135 134* 137  K 5.1 4.8 4.7 4.5 4.5  CL 102 102 103 101 104  CO2 24 23 24 27 22   GLUCOSE 83 89 94 103* 108*  BUN 26* 39* 45* 40* 40*  CREATININE 1.30* 1.53* 1.47* 1.43* 1.26*  CALCIUM 9.0 8.7* 8.5* 8.7* 8.4*  MG 2.0  --   --   --   --    GFR: Estimated Creatinine Clearance: 43.7 mL/min (A) (by C-G formula based on SCr of 1.26 mg/dL (H)). Liver Function Tests: No results for input(s): AST, ALT, ALKPHOS, BILITOT, PROT, ALBUMIN in the last 168  hours. No results for input(s): LIPASE, AMYLASE in the last 168 hours. No results for input(s): AMMONIA in the last 168 hours. Coagulation Profile: No results for input(s): INR, PROTIME in the last 168 hours. Cardiac Enzymes: No results for input(s): CKTOTAL, CKMB, CKMBINDEX, TROPONINI in the last 168 hours. BNP (last 3 results) No results for input(s): PROBNP in the last 8760 hours. HbA1C: No results for input(s): HGBA1C in the last 72 hours. CBG: Recent Labs  Lab 08/23/19 2103 08/24/19 0825 08/24/19 1144 08/24/19 1644 08/24/19 2130  GLUCAP 157* 133* 139* 156* 150*   Lipid Profile: No results for input(s): CHOL, HDL, LDLCALC, TRIG, CHOLHDL, LDLDIRECT in the last 72 hours. Thyroid Function Tests: No results for input(s): TSH, T4TOTAL, FREET4, T3FREE, THYROIDAB in the last 72 hours. Anemia Panel: No results for input(s): VITAMINB12, FOLATE, FERRITIN, TIBC, IRON, RETICCTPCT in the last 72 hours. Sepsis Labs: No results for input(s): PROCALCITON, LATICACIDVEN in the last 168 hours.  Recent Results (from the past 240 hour(s))  Aspergillus Ag, BAL/Serum     Status: None   Collection Time: 08/16/19  5:10 AM  Result Value Ref Range Status   Aspergillus Ag, BAL/Serum 0.03 0.00 - 0.49 Index Final    Comment: (NOTE) Performed At: [facility] [place], [place] [id number] Rush Farmer MD [id number] Performed At: [facility] [address] [place], [place] [id number] Katina Degree MDPhD [id number]          Radiology Studies: No results found.      Scheduled Meds: . amLODipine  2.5 mg Oral Daily  . Chlorhexidine Gluconate Cloth  6 each Topical Daily  . cholecalciferol  2,000 Units Oral Daily  . clopidogrel  75 mg Oral Daily  . docusate sodium  100 mg Oral BID  . enoxaparin (LOVENOX) injection  40 mg Subcutaneous Daily  . finasteride  5 mg Oral Daily  . insulin aspart  0-15 Units Subcutaneous TID WC  . insulin aspart  0-5 Units  Subcutaneous QHS  . ipratropium-albuterol  3 mL Inhalation QID  . levothyroxine  112.5 mcg Oral Once per day on Thu  . levothyroxine  75 mcg Oral Once per day on Sun Mon Tue Wed Fri Sat  . metoprolol tartrate  12.5 mg Oral BID  . multivitamin with minerals  1 tablet Oral Daily  . omega-3 acid ethyl esters  1 capsule Oral Daily  . pantoprazole  40 mg Oral BID  . polyethylene glycol  17 g Oral Daily  . pravastatin  40 mg Oral q1800  . predniSONE  20 mg Oral Q breakfast  . rOPINIRole  0.25 mg Oral QHS  . tamsulosin  0.4 mg Oral Daily  . vitamin C  500 mg Oral Daily   Continuous Infusions:   LOS: 14 days    Time spent: 30 mins  Wyvonnia Dusky, MD [facility] Pager [phone]  If 7PM-7AM, please contact night-coverage [email or web address] Password [facility] 08/25/2019, 7:14 AM

## 2019-08-25 NOTE — Care Management Important Message (Signed)
Important Message  Patient Details  Name: Ryan Pittman MRN: [id number] Date of Birth: May 18, 1941   Medicare Important Message Given:  Yes     [name] 08/25/2019, 11:43 AM

## 2019-08-26 LAB — CBC
HCT: 28.6 % — ABNORMAL LOW (ref 39.0–52.0)
Hemoglobin: 9.5 g/dL — ABNORMAL LOW (ref 13.0–17.0)
MCH: 29.5 pg (ref 26.0–34.0)
MCHC: 33.2 g/dL (ref 30.0–36.0)
MCV: 88.8 fL (ref 80.0–100.0)
Platelets: 196 10*3/uL (ref 150–400)
RBC: 3.22 MIL/uL — ABNORMAL LOW (ref 4.22–5.81)
RDW: 20.1 % — ABNORMAL HIGH (ref 11.5–15.5)
WBC: 27.1 10*3/uL — ABNORMAL HIGH (ref 4.0–10.5)
nRBC: 0.2 % (ref 0.0–0.2)

## 2019-08-26 LAB — BASIC METABOLIC PANEL
Anion gap: 5 (ref 5–15)
Anion gap: 6 (ref 5–15)
BUN: 35 mg/dL — ABNORMAL HIGH (ref 8–23)
BUN: 40 mg/dL — ABNORMAL HIGH (ref 8–23)
CO2: 28 mmol/L (ref 22–32)
CO2: 25 mmol/L (ref 22–32)
Calcium: 8.4 mg/dL — ABNORMAL LOW (ref 8.9–10.3)
Calcium: 8.4 mg/dL — ABNORMAL LOW (ref 8.9–10.3)
Chloride: 102 mmol/L (ref 98–111)
Chloride: 101 mmol/L (ref 98–111)
Creatinine, Ser: 1.25 mg/dL — ABNORMAL HIGH (ref 0.61–1.24)
Creatinine, Ser: 1.49 mg/dL — ABNORMAL HIGH (ref 0.61–1.24)
GFR calc Af Amer: >60 mL/min (ref >60)
GFR calc Af Amer: 51 mL/min — ABNORMAL LOW (ref >60)
GFR calc non Af Amer: 54 mL/min — ABNORMAL LOW (ref >60)
GFR calc non Af Amer: 44 mL/min — ABNORMAL LOW (ref >60)
Glucose, Bld: 94 mg/dL (ref 70–99)
Glucose, Bld: 220 mg/dL — ABNORMAL HIGH (ref 70–99)
Potassium: 4.5 mmol/L (ref 3.5–5.1)
Potassium: 4.8 mmol/L (ref 3.5–5.1)
Sodium: 135 mmol/L (ref 135–145)
Sodium: 132 mmol/L — ABNORMAL LOW (ref 135–145)

## 2019-08-26 LAB — GLUCOSE, CAPILLARY
Glucose-Capillary: 143 mg/dL — ABNORMAL HIGH (ref 70–99)
Glucose-Capillary: 103 mg/dL — ABNORMAL HIGH (ref 70–99)
Glucose-Capillary: 216 mg/dL — ABNORMAL HIGH (ref 70–99)
Glucose-Capillary: 147 mg/dL — ABNORMAL HIGH (ref 70–99)

## 2019-08-26 NOTE — Plan of Care (Signed)
Pt has maintained saturations within normal limits. Pt educated on O2 sats.

## 2019-08-26 NOTE — Progress Notes (Signed)
Pt ambulated with this nurse around the 1A unit, he completed two laps.  Supplemental O2 was used and set at 3L via [place] initially.  Once pt made it to the door from his bed his sats dropped into the mid 80's 85-86%.  He was titrated up to 6L where he was able to maintain his saturation between 90-92%, he remained on 6L for the duration of his walk.  Breathing was not labored and pt did not requested to make a stop and rest, he wanted to "keep going".  Once back in room and back in bed he was able to be weaned to 4L, saturation at that time were 93-94%.  He is resting soundly in bed at this time.  Wife is at his bedside.

## 2019-08-26 NOTE — Progress Notes (Signed)
Occupational Therapy Treatment Patient Details Name: Ryan Pittman MRN: [id number] DOB: 03-24-1941 Today's Date: 08/26/2019    History of present illness Pt. is an 78 yo male presenting to hospital 08/11/19 with increased SOB and cough.  Pt. (+) COVID about 4 weeks prior and hospitalized 11/4-11/15 d/t COVID-19 infection and discharged home on supplemental O2.  Pt now admitted with acute hypoxic respiratory failure secondary multi-focal PNA vs pneumonitis and pulmonary edema requiring HFNC.  S/p 1 unit PRBC's 12/9.  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  PMHx includes: CABG, anemia, angina, CKD, gastric reflux, htn.   OT comments  Pt./caregiver education was provided about energy conservation, work simplification techniques, A/E use for ADLs. Pt. was provided with a visual handout. Pt. and his wife were assisted in problem solving through anticipated ADL/IADL related concerns when returning home. Pt. performed BUE there. Ex. with yellow theraband for bilateral horizontal abduction, elbow flexion, and extension. Pt. performed 1 set 10 reps with verbal cues, and visual demonstration for proper technique. SO2 97-98% HR 65-83 bpms. Pt. continues to benefit from OT services for ADL training, A/E training, UE there. Ex,  and pt. education about home modification, and DME. Pt. Plans to return home upon discharge with family to assist pt. as needed. Pt. Could benefit from follow-up [place] services upon discharge.   Follow Up Recommendations  Home health OT    Equipment Recommendations       Recommendations for Other Services      Precautions / Restrictions Precautions Precautions: Fall Restrictions Weight Bearing Restrictions: No       Mobility Bed Mobility                  Transfers      Pt. seen at bed level secondary to just returning back to bed after walking.        Balance                                       ADL either performed or assessed  with clinical judgement   ADL Overall ADL's : Needs assistance/impaired                                       General ADL Comments: Pt. education was provided about energy conservation techniques, work simplification strategies, and A/E use for ADLs/IADLs.     Vision Baseline Vision/History: Wears glasses Wears Glasses: Reading only Patient Visual Report: No change from baseline     Perception     Praxis      Cognition Arousal/Alertness: Awake/alert Behavior During Therapy: WFL for tasks assessed/performed Overall Cognitive Status: Within Functional Limits for tasks assessed                                          Exercises  BUE there. ex with yellow theraband for bilateral horizontal abduction, elbow flexion, and extension for 1 set 10 reps with verbal cues, and visual demonstration for proper technique.   Shoulder Instructions       General Comments      Pertinent Vitals/ Pain       Pain Assessment: No/denies pain  Home Living  Prior Functioning/Environment              Frequency  Min 1X/week        Progress Toward Goals  OT Goals(current goals can now be found in the care plan section)  Progress towards OT goals: Progressing toward goals  Acute Rehab OT Goals Patient Stated Goal: to go home OT Goal Formulation: With patient/family Potential to Achieve Goals: Good  Plan      Co-evaluation                 AM-PAC OT "6 Clicks" Daily Activity     Outcome Measure   Help from another person eating meals?: None Help from another person taking care of personal grooming?: None Help from another person toileting, which includes using toliet, bedpan, or urinal?: A Little Help from another person bathing (including washing, rinsing, drying)?: A Little Help from another person to put on and taking off regular upper body clothing?: None Help from another  person to put on and taking off regular lower body clothing?: A Little 6 Click Score: 21    End of Session        Activity Tolerance Patient tolerated treatment well   Patient Left in chair;with call bell/phone within reach;with family/visitor present   Nurse Communication          Time: [id number] OT Time Calculation (min): 30 min  Charges: OT General Charges $OT Visit: 1 Visit OT Treatments $Self Care/Home Management : 23-37 mins  Harrel Carina, MS, OTR/L   Harrel Carina 08/26/2019, 4:35 PM

## 2019-08-26 NOTE — Progress Notes (Signed)
PROGRESS NOTE    Ryan Pittman  [id number] DOB: December 31, 1940 DOA: 08/11/2019 PCP: Tonia Ghent, MD     Assessment & Plan:   Principal Problem:   Multifocal pneumonia Active Problems:   CKD (chronic kidney disease) stage 3, GFR 30-59 ml/min   BPH with obstruction/lower urinary tract symptoms   HTN (hypertension)   Hyperlipidemia   Hypothyroidism   PAF (paroxysmal atrial fibrillation) (HCC)   CAD (coronary artery disease)   Acute kidney injury superimposed on CKD ([place])   Pneumonia due to COVID-19 virus   Normocytic anemia   Acute respiratory disease due to COVID-19 virus   Acute on chronic respiratory failure with hypoxia (HCC)  Acute hypoxic respiratory failure: continues to improve slowly. Likely multifactorial. 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 Completed course of steroids. Continue on supplemental oxygen and wean back to baseline as tolerated, currently on 4L [place] still. Encourage incentive spirometry   Pneumonia: multifocal lobar. Likely secondary to [place]. Abx d/c as per ICU team  Leukocytosis: infection vs steroid use. Slowly trending down still. Will continue to monitor   Essential hypertension: continue metoprolol, amlodipine  AKI on CKD: Cr is stable. Avoid nephroxtoxins. Will continue to monitor   Hyperkalemia: resolved. Will continue to monitor  Paroxysmal atrial fibrillation: continue on metoprolol.  Amiodarone on hold with abnormal CT scan of the chest.  Iron deficiency anemia: s/p 1 unit of packed red blood cells and iron during the hospital course. H&H are stable. No need for a transfusion currently  Hypothyroidism: continue on levothyroxine  BPH: continue on Flomax, Proscar   Restless leg syndrome: continue on Requip  Weakness: PT/OT now both recommend home health. CM is aware    DVT prophylaxis: lovenox Code Status: full  Family Communication:  Disposition Plan: can likely  discharge home w/ home health in 24-72 hours    Consultants:   ICU   Antimicrobials: n/a   Subjective: Pt c/o malaise. Pt c/o weakness w/ ambulation.   Objective: Vitals:   08/25/19 2032 08/25/19 2205 08/25/19 2220 08/25/19 2338  BP:  (!) 113/56 (!) 113/56 (!) 110/58  Pulse:  75 75 73  Resp:    17  Temp:    98 F (36.7 C)  TempSrc:    Oral  SpO2: 96%   98%  Weight:      Height:        Intake/Output Summary (Last 24 hours) at 08/26/2019 0719 Last data filed at 08/26/2019 0500 Gross per 24 hour  Intake 240 ml  Output 800 ml  Net -560 ml   Filed Weights   08/11/19 0958  Weight: 68 kg    Examination:  General exam: Appears calm and comfortable  Respiratory system: diminshed breath sounds b/l. No rales Cardiovascular system: S1 & S2 normal. No rubs, gallops   Gastrointestinal system: Abdomen is nondistended, soft and nontender.  Normal bowel sounds heard. Central nervous system: Alert and oriented. Moves all 4 extremities  Psychiatry: Judgement and insight appear normal. Mood & affect appropriate.     Data Reviewed: I have personally reviewed following labs and imaging studies  CBC: Recent Labs  Lab 08/21/19 0435 08/22/19 0512 08/23/19 0403 08/24/19 0402 08/25/19 0451 08/26/19 0506  WBC 28.7* 31.5* 31.3* 29.7* 28.7* 27.1*  NEUTROABS 19.6*  --   --   --   --   --   HGB 10.5* 10.3* 9.7* 9.7* 9.8* 9.5*  HCT 32.0* 30.7* 29.6* 30.2* 28.7* 28.6*  MCV 87.7 86.7  89.7 91.5 86.7 88.8  PLT 359 321 277 251 230 [id number]   Basic Metabolic Panel: Recent Labs  Lab 08/21/19 0435 08/22/19 0512 08/23/19 0403 08/24/19 0402 08/25/19 0451 08/26/19 0506  NA 134* 133* 135 134* 137 135  K 5.1 4.8 4.7 4.5 4.5 4.5  CL 102 102 103 101 104 102  CO2 24 23 24 27 22 28   GLUCOSE 83 89 94 103* 108* 94  BUN 26* 39* 45* 40* 40* 35*  CREATININE 1.30* 1.53* 1.47* 1.43* 1.26* 1.25*  CALCIUM 9.0 8.7* 8.5* 8.7* 8.4* 8.4*  MG 2.0  --   --   --   --   --    GFR: Estimated  Creatinine Clearance: 44.1 mL/min (A) (by C-G formula based on SCr of 1.25 mg/dL (H)). Liver Function Tests: No results for input(s): AST, ALT, ALKPHOS, BILITOT, PROT, ALBUMIN in the last 168 hours. No results for input(s): LIPASE, AMYLASE in the last 168 hours. No results for input(s): AMMONIA in the last 168 hours. Coagulation Profile: No results for input(s): INR, PROTIME in the last 168 hours. Cardiac Enzymes: No results for input(s): CKTOTAL, CKMB, CKMBINDEX, TROPONINI in the last 168 hours. BNP (last 3 results) No results for input(s): PROBNP in the last 8760 hours. HbA1C: No results for input(s): HGBA1C in the last 72 hours. CBG: Recent Labs  Lab 08/24/19 2130 08/25/19 0752 08/25/19 1150 08/25/19 1638 08/25/19 2124  GLUCAP 150* 92 104* 152* 113*   Lipid Profile: No results for input(s): CHOL, HDL, LDLCALC, TRIG, CHOLHDL, LDLDIRECT in the last 72 hours. Thyroid Function Tests: No results for input(s): TSH, T4TOTAL, FREET4, T3FREE, THYROIDAB in the last 72 hours. Anemia Panel: No results for input(s): VITAMINB12, FOLATE, FERRITIN, TIBC, IRON, RETICCTPCT in the last 72 hours. Sepsis Labs: No results for input(s): PROCALCITON, LATICACIDVEN in the last 168 hours.  No results found for this or any previous visit (from the past 240 hour(s)).       Radiology Studies: No results found.      Scheduled Meds: . amLODipine  2.5 mg Oral Daily  . Chlorhexidine Gluconate Cloth  6 each Topical Daily  . cholecalciferol  2,000 Units Oral Daily  . clopidogrel  75 mg Oral Daily  . docusate sodium  100 mg Oral BID  . enoxaparin (LOVENOX) injection  40 mg Subcutaneous Daily  . finasteride  5 mg Oral Daily  . insulin aspart  0-15 Units Subcutaneous TID WC  . insulin aspart  0-5 Units Subcutaneous QHS  . ipratropium-albuterol  3 mL Inhalation TID  . levothyroxine  112.5 mcg Oral Once per day on Thu  . levothyroxine  75 mcg Oral Once per day on Sun Mon Tue Wed Fri Sat  .  metoprolol tartrate  12.5 mg Oral BID  . multivitamin with minerals  1 tablet Oral Daily  . omega-3 acid ethyl esters  1 capsule Oral Daily  . pantoprazole  40 mg Oral BID  . polyethylene glycol  17 g Oral Daily  . pravastatin  40 mg Oral q1800  . predniSONE  20 mg Oral Q breakfast  . rOPINIRole  0.25 mg Oral QHS  . tamsulosin  0.4 mg Oral Daily  . vitamin C  500 mg Oral Daily   Continuous Infusions:   LOS: 15 days    Time spent: 30 mins    Wyvonnia Dusky, MD [facility] Pager [phone]  If 7PM-7AM, please contact night-coverage [email or web address] Password [facility] 08/26/2019, 7:19 AM

## 2019-08-27 LAB — GLUCOSE, CAPILLARY
Glucose-Capillary: 92 mg/dL (ref 70–99)
Glucose-Capillary: 104 mg/dL — ABNORMAL HIGH (ref 70–99)
Glucose-Capillary: 104 mg/dL — ABNORMAL HIGH (ref 70–99)
Glucose-Capillary: 135 mg/dL — ABNORMAL HIGH (ref 70–99)

## 2019-08-27 LAB — CBC
HCT: 28.7 % — ABNORMAL LOW (ref 39.0–52.0)
Hemoglobin: 9.5 g/dL — ABNORMAL LOW (ref 13.0–17.0)
MCH: 29.4 pg (ref 26.0–34.0)
MCHC: 33.1 g/dL (ref 30.0–36.0)
MCV: 88.9 fL (ref 80.0–100.0)
Platelets: 179 10*3/uL (ref 150–400)
RBC: 3.23 MIL/uL — ABNORMAL LOW (ref 4.22–5.81)
RDW: 20.4 % — ABNORMAL HIGH (ref 11.5–15.5)
WBC: 25.7 10*3/uL — ABNORMAL HIGH (ref 4.0–10.5)
nRBC: 0.1 % (ref 0.0–0.2)

## 2019-08-27 NOTE — Progress Notes (Signed)
SATURATION QUALIFICATIONS: (This note is used to comply with regulatory documentation for home oxygen)  Patient Saturations on Room Air at Rest = 86%  Patient Saturations on Room Air while Ambulating = 83%  Patient Saturations on 3 Liters of oxygen while Ambulating = 93%  Please briefly explain why patient needs home oxygen: Pt desats on room air while at rest.

## 2019-08-27 NOTE — Progress Notes (Signed)
New referral for [facility] to follow at home received from [facility]. [facility] will be followed by [facility]. Patient information given to referral. Flo Shanks BSN, RN, [place] [phone]

## 2019-08-27 NOTE — Care Management Important Message (Signed)
Important Message  Patient Details  Name: Ryan Pittman MRN: [id number] Date of Birth: 02-09-1941   Medicare Important Message Given:  Yes     [name] 08/27/2019, 11:27 AM

## 2019-08-27 NOTE — Progress Notes (Addendum)
PROGRESS NOTE    Ryan Pittman  [id number] DOB: 12-09-1940 DOA: 08/11/2019 PCP: Tonia Ghent, MD     Assessment & Plan:   Principal Problem:   Multifocal pneumonia Active Problems:   CKD (chronic kidney disease) stage 3, GFR 30-59 ml/min   BPH with obstruction/lower urinary tract symptoms   HTN (hypertension)   Hyperlipidemia   Hypothyroidism   PAF (paroxysmal atrial fibrillation) (HCC)   CAD (coronary artery disease)   Acute kidney injury superimposed on CKD ([place])   Pneumonia due to COVID-19 virus   Normocytic anemia   Acute respiratory disease due to COVID-19 virus   Acute on chronic respiratory failure with hypoxia (HCC)  Acute on chronic hypoxic respiratory failure: continues to improve slowly. Likely multifactorial. 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 Completed course of steroids. Continue on supplemental oxygen and wean back to baseline as tolerated, currently on 3L [place] still. Encourage incentive spirometry. He will need NIV at home at discharge  Pneumonia: multifocal lobar. Likely secondary to [place].  Off antibiotics  Leukocytosis: infection vs steroid use. Slowly trending down still. Will continue to monitor   Essential hypertension: continue metoprolol, amlodipine  AKI on CKD: Cr is stable. Avoid nephroxtoxins. Will continue to monitor   Hyperkalemia: resolved. Will continue to monitor  Paroxysmal atrial fibrillation: continue on metoprolol.  Amiodarone on hold with abnormal CT scan of the chest.  Iron deficiency anemia: s/p 1 unit of packed red blood cells and iron during the hospital course. H&H are stable. No need for a transfusion currently  Hypothyroidism: continue on levothyroxine  BPH: continue on Flomax, Proscar   Restless leg syndrome: continue on Requip  Weakness: PT/OT now both recommend home health. CM set this up   DVT prophylaxis: lovenox Code Status: full    Family Communication: Discussed with wife for almost 30 minutes (once in person and second time in the room over phone with patient being there Disposition Plan: discharge home w/ home health and palliative care tomorrow   Consultants:   ICU   Antimicrobials: n/a   Subjective: No new complaints, just afraid of leaving for home as he does not know if his family is ready to take him.  Requesting to stay till Friday and/weekend so that his wife is prepared as she is supposed to stay with her granddaughter  Objective: Vitals:   08/27/19 0944 08/27/19 1421 08/27/19 1534 08/27/19 1539  BP:    (!) 111/52  Pulse:    71  Resp:    17  Temp:    97.6 F (36.4 C)  TempSrc:    Oral  SpO2: 98% 94% 90% 94%  Weight:      Height:        Intake/Output Summary (Last 24 hours) at 08/27/2019 1711 Last data filed at 08/27/2019 1551 Gross per 24 hour  Intake 480 ml  Output 1030 ml  Net -550 ml   Filed Weights   08/11/19 0958  Weight: 68 kg    Examination:  General exam: Appears calm and comfortable  Respiratory system: diminshed breath sounds b/l. No rales Cardiovascular system: S1 & S2 normal. No rubs, gallops   Gastrointestinal system: Abdomen is nondistended, soft and nontender.  Normal bowel sounds heard. Central nervous system: Alert and oriented. Moves all 4 extremities  Psychiatry: Judgement and insight appear normal. Mood & affect appropriate.     Data Reviewed: I have personally reviewed following labs and imaging studies  CBC: Recent Labs  Lab 08/21/19 0435 08/23/19 0403 08/24/19 0402 08/25/19 0451 08/26/19 0506 08/27/19 0507  WBC 28.7* 31.3* 29.7* 28.7* 27.1* 25.7*  NEUTROABS 19.6*  --   --   --   --   --   HGB 10.5* 9.7* 9.7* 9.8* 9.5* 9.5*  HCT 32.0* 29.6* 30.2* 28.7* 28.6* 28.7*  MCV 87.7 89.7 91.5 86.7 88.8 88.9  PLT 359 277 251 230 196 0000000   Basic Metabolic Panel: Recent Labs  Lab 08/21/19 0435 08/23/19 0403 08/24/19 0402 08/25/19 0451  08/26/19 0506 08/26/19 1257  NA 134* 135 134* 137 135 132*  K 5.1 4.7 4.5 4.5 4.5 4.8  CL 102 103 101 104 102 101  CO2 24 24 27 22 28 25   GLUCOSE 83 94 103* 108* 94 220*  BUN 26* 45* 40* 40* 35* 40*  CREATININE 1.30* 1.47* 1.43* 1.26* 1.25* 1.49*  CALCIUM 9.0 8.5* 8.7* 8.4* 8.4* 8.4*  MG 2.0  --   --   --   --   --    CBG: Recent Labs  Lab 08/26/19 1659 08/26/19 2109 08/27/19 0813 08/27/19 1132 08/27/19 1646  GLUCAP 216* 147* 92 104* 104*     Scheduled Meds: . amLODipine  2.5 mg Oral Daily  . Chlorhexidine Gluconate Cloth  6 each Topical Daily  . cholecalciferol  2,000 Units Oral Daily  . clopidogrel  75 mg Oral Daily  . docusate sodium  100 mg Oral BID  . enoxaparin (LOVENOX) injection  40 mg Subcutaneous Daily  . finasteride  5 mg Oral Daily  . insulin aspart  0-15 Units Subcutaneous TID WC  . insulin aspart  0-5 Units Subcutaneous QHS  . ipratropium-albuterol  3 mL Inhalation TID  . levothyroxine  112.5 mcg Oral Once per day on Thu  . levothyroxine  75 mcg Oral Once per day on Sun Mon Tue Wed Fri Sat  . metoprolol tartrate  12.5 mg Oral BID  . multivitamin with minerals  1 tablet Oral Daily  . omega-3 acid ethyl esters  1 capsule Oral Daily  . pantoprazole  40 mg Oral BID  . polyethylene glycol  17 g Oral Daily  . pravastatin  40 mg Oral q1800  . rOPINIRole  0.25 mg Oral QHS  . tamsulosin  0.4 mg Oral Daily  . vitamin C  500 mg Oral Daily   Continuous Infusions:   LOS: 16 days    Time spent: 30 mins    Max Sane, MD [facility] Pager [phone]  If 7PM-7AM, please contact night-coverage [email or web address] Password [email or web address] 08/27/2019, 5:11 PM

## 2019-08-27 NOTE — Progress Notes (Cosign Needed Addendum)
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 Patient requires the use of NIV both QHS and daytime to help with exacerbation periods. The use of the NIV will treat patient's Low PFT (FEV1/FVC) Levels and can reduce risk of exacerbations and future hospitalizations when used at night and during the day. Pt will need these advanced settings in conjunction with her current medication regimen; BIPAP is not an option due to its functional limitations and the severity of the patient's condition. Failure to have NIV available for use over a 24- hour period could lead to death. Patient is able to clear and maintain their own airway.

## 2019-08-27 NOTE — Progress Notes (Signed)
Occupational Therapy Treatment Patient Details Name: Ryan Pittman MRN: [id number] DOB: July 09, 1941 Today's Date: 08/27/2019    History of present illness Pt. is an 78 yo male presenting to hospital 08/11/19 with increased SOB and cough.  Pt. (+) COVID about 4 weeks prior and hospitalized 11/4-11/15 d/t COVID-19 infection and discharged home on supplemental O2.  Pt now admitted with acute hypoxic respiratory failure secondary multi-focal PNA vs pneumonitis and pulmonary edema requiring HFNC.  S/p 1 unit PRBC's 12/9.  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  PMHx includes: CABG, anemia, angina, CKD, gastric reflux, htn.   OT comments  Pt. performed BUE ther. Ex. with yellow theraband for bilateral shoulder flexion, horizontal abduction, elbow flexion, and extension. Pt. was able to return demonstration of the proper technique for each exercise. Pt. And caregiver education was provided, as well as yellow theraband. SO2:93- 94% on 3LO2. Pt. could benefit from OT services for ADL training, A/E training, and pt. Education about home modification, and DME.Pt. Plans to return home upon discharge with family to assist pt. as needed. Pt. will benefit from follow-up [place] services upon discharge.   Follow Up Recommendations  Home health OT    Equipment Recommendations  None recommended by OT    Recommendations for Other Services      Precautions / Restrictions Precautions Precautions: Fall Restrictions Weight Bearing Restrictions: No       Mobility Bed Mobility Overal bed mobility: Needs Assistance Bed Mobility: Supine to Sit     Supine to sit: Supervision        Transfers                 General transfer comment: Deferred    Balance                                           ADL either performed or assessed with clinical judgement   ADL Overall ADL's : Needs assistance/impaired                                              Vision Baseline Vision/History: Wears glasses Wears Glasses: Reading only Patient Visual Report: No change from baseline     Perception     Praxis      Cognition Arousal/Alertness: Awake/alert Behavior During Therapy: WFL for tasks assessed/performed Overall Cognitive Status: Within Functional Limits for tasks assessed                                          Exercises     Shoulder Instructions       General Comments      Pertinent Vitals/ Pain       Pain Assessment: No/denies pain  Home Living                                          Prior Functioning/Environment              Frequency  Min 1X/week        Progress Toward Goals  OT Goals(current goals can now be  found in the care plan section)  Progress towards OT goals: Progressing toward goals  Acute Rehab OT Goals Patient Stated Goal: to go home OT Goal Formulation: With patient/family Time For Goal Achievement: 09/04/19 Potential to Achieve Goals: Good  Plan      Co-evaluation                 AM-PAC OT "6 Clicks" Daily Activity     Outcome Measure   Help from another person eating meals?: None Help from another person taking care of personal grooming?: None Help from another person toileting, which includes using toliet, bedpan, or urinal?: A Little Help from another person bathing (including washing, rinsing, drying)?: A Little Help from another person to put on and taking off regular upper body clothing?: None Help from another person to put on and taking off regular lower body clothing?: A Little 6 Click Score: 21    End of Session Equipment Utilized During Treatment: Gait belt;Rolling walker;Oxygen  OT Visit Diagnosis: Other abnormalities of gait and mobility (R26.89);Muscle weakness (generalized) (M62.81)   Activity Tolerance Patient tolerated treatment well   Patient Left in chair;with call bell/phone within reach;with family/visitor  present   Nurse Communication Mobility status        Time: 1130-1148 OT Time Calculation (min): 18 min  Charges: OT General Charges $OT Visit: 1 Visit OT Treatments $Self Care/Home Management : 8-22 mins  Harrel Carina, MS, OTR/L  Harrel Carina 08/27/2019, 12:11 PM

## 2019-08-27 NOTE — Progress Notes (Signed)
Physical Therapy Treatment Patient Details Name: Ryan Pittman MRN: [id number] DOB: [birth year] Today's Date: 08/27/2019    History of Present Illness Ryan Pittman is an 78 yo male presenting to hospital 08/11/19 with increased SOB and cough.  Of note, pt (+) COVID about 4 weeks prior and hospitalized 11/4-11/15 d/t COVID-19 infection and discharged home on supplemental O2.  Pt now admitted with acute hypoxic respiratory failure secondary multi-focal PNA vs pneumonitis and pulmonary edema requiring HFNC.  S/p 1 unit PRBC's 12/9.  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  PMH includes CABG, anemia, angina, CKD, gastric reflux, htn.    PT Comments    Pt appearing in good spirits today and ready to work with therapy, though reporting increase in fatigue following walking with therapy last treatment.  Pt took 4 rest breaks during 100 ft of ambulation and using RW, experienced desat to 86% which did not recover until he sat down after returning to his room.  Pt gait pattern appears to be improved today as compared to previous treatments.  Pt did not appear SOB or overly fatigued but did state that he felt he needed to sit down.  PT introduced seated abdominal strengthening and breathing there ex to pt and he was able to follow.  Pt's wife also present and engaged in session.  Pt will continue to benefit from skilled PT with focus on strength, tolerance to activity and safe functional mobility.  Follow Up Recommendations  Home health PT     Equipment Recommendations  Rolling walker with 5" wheels;3in1 (PT)    Recommendations for Other Services       Precautions / Restrictions Precautions Precautions: Fall Precaution Comments: watch O2 sats Restrictions Weight Bearing Restrictions: No    Mobility  Bed Mobility Overal bed mobility: Modified Independent Bed Mobility: Supine to Sit     Supine to sit: Supervision        Transfers Overall transfer level: Needs  assistance Equipment used: Rolling walker (2 wheeled) Transfers: Sit to/from Stand Sit to Stand: Supervision         General transfer comment: Able to stand without bed elevated.  Ambulation/Gait   Gait Distance (Feet): 100 Feet Assistive device: Rolling walker (2 wheeled)     Gait velocity interpretation: 1.31 - 2.62 ft/sec, indicative of limited community ambulator General Gait Details: step through gait pattern; 4 rest breaks standing with guided breathing exercises.  Minimal use of RW.  Pt desat to 86% and did not recover until returning to chair, despite standing breaks and breathing exercises.   Stairs             Wheelchair Mobility    Modified Rankin (Stroke Patients Only)       Balance Overall balance assessment: Needs assistance Sitting-balance support: No upper extremity supported;Feet supported Sitting balance-Leahy Scale: Normal Sitting balance - Comments: steady sitting reaching outside BOS   Standing balance support: No upper extremity supported Standing balance-Leahy Scale: Good Standing balance comment: Able to stand without use of RW, used for functional mobility and energy conservation.                            Cognition Arousal/Alertness: Awake/alert Behavior During Therapy: WFL for tasks assessed/performed Overall Cognitive Status: Within Functional Limits for tasks assessed  Exercises Other Exercises Other Exercises: Seated leanback, leanback with trunk rotation, leanback with marching 2x10 each with education.  Pt's wife present.    General Comments        Pertinent Vitals/Pain Pain Assessment: No/denies pain    Home Living                      Prior Function            PT Goals (current goals can now be found in the care plan section) Acute Rehab PT Goals Patient Stated Goal: to go home PT Goal Formulation: With patient Time For Goal  Achievement: 09/03/19 Potential to Achieve Goals: Good Progress towards PT goals: Progressing toward goals    Frequency    Min 2X/week      PT Plan Current plan remains appropriate    Co-evaluation              AM-PAC PT "6 Clicks" Mobility   Outcome Measure  Help needed turning from your back to your side while in a flat bed without using bedrails?: None Help needed moving from lying on your back to sitting on the side of a flat bed without using bedrails?: None Help needed moving to and from a bed to a chair (including a wheelchair)?: A Little Help needed standing up from a chair using your arms (e.g., wheelchair or bedside chair)?: A Little Help needed to walk in hospital room?: A Little Help needed climbing 3-5 steps with a railing? : A Little 6 Click Score: 20    End of Session Equipment Utilized During Treatment: Gait belt;Oxygen Activity Tolerance: Patient tolerated treatment well;Patient limited by fatigue Patient left: in chair;with call bell/phone within reach;with chair alarm set;with family/visitor present Nurse Communication: Mobility status;Precautions;Other (comment)(Pt's O2 sats during session) PT Visit Diagnosis: Other abnormalities of gait and mobility (R26.89);Unsteadiness on feet (R26.81);Muscle weakness (generalized) (M62.81);Difficulty in walking, not elsewhere classified (R26.2)     Time: [id number] PT Time Calculation (min) (ACUTE ONLY): 26 min  Charges:  $Therapeutic Exercise: 23-37 mins                     Roxanne Gates, PT, DPT    Roxanne Gates 08/27/2019, 3:39 PM

## 2019-08-27 NOTE — TOC Progression Note (Addendum)
Transition of Care [facility]) - Progression Note    Patient Details  Name: Ryan Pittman MRN: [id number] Date of Birth: [birth year]  [facility] CM/SW Contact  Su Hilt, RN Phone Number: 08/27/2019, 9:41 AM  Clinical Narrative:    Spoke with the Sharlet [place] she would like to speak to Dr Manuella Ghazi, She  will at the hospital at 10 AM, I notified Dr Manuella Ghazi.  She is open to have the patient have a [facility] NIV and would like someone to go to the home to set it up. I explained I have the patient set up for [facility] services thru [facility] also.  She stated she really was thinking the patient could stay until Christmas.  I explained the patient is medically stable but the Doctor will speak with her.   Expected Discharge Plan: [place] Barriers to Discharge: Continued Medical Work up  Expected Discharge Plan and Services Expected Discharge Plan: [place] arrangements for the past 2 months: Single Family Home                                       Social Determinants of Health (SDOH) Interventions    Readmission Risk Interventions Readmission Risk Prevention Plan 08/15/2019 07/15/2019  Transportation Screening Complete Complete  Palliative Care Screening Not Applicable -  Medication Review (RN Care Manager) Complete Complete  Some recent data might be hidden

## 2019-08-27 NOTE — Progress Notes (Signed)
Applied humidifier sterile bottle to oxygen to help with pt's nose being dry.

## 2019-08-27 NOTE — Progress Notes (Signed)
Bedside spirometry completed with patient. Patient in chair at bedside when doing testing. Patient gave his best efforts and understood directions well.      Pred  Best  % Predicted    FVC  3.54  2.49       70.3%  FEV1  2.49  1.89       75.9%  FEV1/FVC 71.5  76.1      106.4%

## 2019-08-28 DIAGNOSIS — J9602 Acute respiratory failure with hypercapnia: Secondary | ICD-10-CM

## 2019-08-28 DIAGNOSIS — R0902 Hypoxemia: Secondary | ICD-10-CM

## 2019-08-28 DIAGNOSIS — J9601 Acute respiratory failure with hypoxia: Secondary | ICD-10-CM

## 2019-08-28 LAB — GLUCOSE, CAPILLARY
Glucose-Capillary: 70 mg/dL (ref 70–99)
Glucose-Capillary: 99 mg/dL (ref 70–99)

## 2019-08-28 MED ORDER — ROPINIROLE HCL 0.25 MG PO TABS: 0.2500 mg | ORAL_TABLET | Freq: Every day | ORAL | 0 refills | Status: DC

## 2019-08-28 NOTE — Progress Notes (Signed)
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 Patient requires the use of NIV both QHS and daytime to help with exacerbation periods. The use of the NIV will treat patient's Low PFT (FEV1/FVC) Levels and can reduce risk of exacerbations and future hospitalizations when used at night and during the day. Pt will need these advanced settings in conjunction with her current medication regimen; BIPAP is not an option due to its functional limitations and the severity of the patient's condition. Failure to have NIV available for use over a 24- hour period could lead to death. Patient is able to clear and maintain their own airway.

## 2019-08-28 NOTE — Progress Notes (Signed)
Pt and wife educated on Oxygen home DME. Educated on discharge instructions. Verbalized understanding. VSS. Pt wheeled to medical mall entrance and assisted into vehicle.

## 2019-08-28 NOTE — TOC Transition Note (Signed)
Transition of Care [facility]) - CM/SW Discharge Note   Patient Details  Name: Ryan Pittman MRN: [id number] Date of Birth: 08-16-1941  [facility] of [facility]) CM/SW Contact:  Su Hilt, RN Phone Number: 08/28/2019, 9:17 AM   Clinical Narrative:     Met with the patient and provided a O2 Concentrator from [employer], Reviewed the DME that will go home with the patient, He would like EMS transport to home, EMS paper work completed and on the chart, his wife is aware of the DC   Final next level of care: [place] Barriers to Discharge: Barriers Resolved   Patient Goals and CMS Choice        Discharge Placement                  Name of family member notified: Vaughan Basta Patient and family notified of of transfer: 08/27/19  Discharge Plan and Services                DME Arranged: Gilford Rile rolling, Oxygen, NIV DME Agency: [employer] Date DME Agency Contacted: 08/25/19   Representative spoke with at DME Agency: [place]: PT, RN, Respirator Therapy [place] Agency: [facility] [place] ([place]) Date HH Agency Contacted: 08/25/19   Representative spoke with at [place]: [place] ([place]) Interventions     Readmission Risk Interventions Readmission Risk Prevention Plan 08/15/2019 07/15/2019  Transportation Screening Complete Complete  Palliative Care Screening Not Applicable -  Medication Review (RN Care Manager) Complete Complete  Some recent data might be hidden

## 2019-08-28 NOTE — Discharge Instructions (Signed)
Acute Respiratory Failure, Adult  Acute respiratory failure occurs when there is not enough oxygen passing from your lungs to your body. When this happens, your lungs have trouble removing carbon dioxide from the blood. This causes your blood oxygen level to drop too low as carbon dioxide builds up. Acute respiratory failure is a medical emergency. It can develop quickly, but it is temporary if treated promptly. Your lung capacity, or how much air your lungs can hold, may improve with time, exercise, and treatment. What are the causes? There are many possible causes of acute respiratory failure, including:  Lung injury.  Chest injury or damage to the ribs or tissues near the lungs.  Lung conditions that affect the flow of air and blood into and out of the lungs, such as pneumonia, acute respiratory distress syndrome, and cystic fibrosis.  Medical conditions, such as strokes or spinal cord injuries, that affect the muscles and nerves that control breathing.  Blood infection (sepsis).  Inflammation of the pancreas (pancreatitis).  A blood clot in the lungs (pulmonary embolism).  A large-volume blood transfusion.  Burns.  Near-drowning.  Seizure.  Smoke inhalation.  Reaction to medicines.  Alcohol or drug overdose. What increases the risk? This condition is more likely to develop in people who have:  A blocked airway.  Asthma.  A condition or disease that damages or weakens the muscles, nerves, bones, or tissues that are involved in breathing.  A serious infection.  A health problem that blocks the unconscious reflex that is involved in breathing, such as hypothyroidism or sleep apnea.  A lung injury or trauma. What are the signs or symptoms? Trouble breathing is the main symptom of acute respiratory failure. Symptoms may also include:  Rapid breathing.  Restlessness or anxiety.  Skin, lips, or fingernails that appear blue (cyanosis).  Rapid heart  rate.  Abnormal heart rhythms (arrhythmias).  Confusion or changes in behavior.  Tiredness or loss of energy.  Feeling sleepy or having a loss of consciousness. How is this diagnosed? Your health care provider can diagnose acute respiratory failure with a medical history and physical exam. During the exam, your health care provider will listen to your heart and check for crackling or wheezing sounds in your lungs. Your may also have tests to confirm the diagnosis and determine what is causing respiratory failure. These tests may include:  Measuring the amount of oxygen in your blood (pulse oximetry). The measurement comes from a small device that is placed on your finger, earlobe, or toe.  Other blood tests to measure blood gases and to look for signs of infection.  Sampling your cerebral spinal fluid or tracheal fluid to check for infections.  Chest X-ray to look for fluid in spaces that should be filled with air.  Electrocardiogram (ECG) to look at the heart's electrical activity. How is this treated? Treatment for this condition usually takes places in a hospital intensive care unit (ICU). Treatment depends on what is causing the condition. It may include one or more treatments until your symptoms improve. Treatment may include:  Supplemental oxygen. Extra oxygen is given through a tube in the nose, a face mask, or a hood.  A device such as a continuous positive airway pressure (CPAP) or bi-level positive airway pressure (BiPAP or BPAP) machine. This treatment uses mild air pressure to keep the airways open. A mask or other device will be placed over your nose or mouth. A tube that is connected to a motor will deliver oxygen through  the mask.  Ventilator. This treatment helps move air into and out of the lungs. This may be done with a bag and mask or a machine. For this treatment, a tube is placed in your windpipe (trachea) so air and oxygen can flow to the lungs.  Extracorporeal  membrane oxygenation (ECMO). This treatment temporarily takes over the function of the heart and lungs, supplying oxygen and removing carbon dioxide. ECMO gives the lungs a chance to recover. It may be used if a ventilator is not effective.  Tracheostomy. This is a procedure that creates a hole in the neck to insert a breathing tube.  Receiving fluids and medicines.  Rocking the bed to help breathing. Follow these instructions at home:  Take over-the-counter and prescription medicines only as told by your health care provider.  Return to normal activities as told by your health care provider. Ask your health care provider what activities are safe for you.  Keep all follow-up visits as told by your health care provider. This is important. How is this prevented? Treating infections and medical conditions that may lead to acute respiratory failure can help prevent the condition from developing. Contact a health care provider if:  You have a fever.  Your symptoms do not improve or they get worse. Get help right away if:  You are having trouble breathing.  You lose consciousness.  Your have cyanosis or turn blue.  You develop a rapid heart rate.  You are confused. These symptoms may represent a serious problem that is an emergency. Do not wait to see if the symptoms will go away. Get medical help right away. Call your local emergency services (911 in the [place] Do not drive yourself to the hospital. This information is not intended to replace advice given to you by your health care provider. Make sure you discuss any questions you have with your health care provider. Document Released: 08/26/2013 Document Revised: 08/03/2017 Document Reviewed: 03/08/2016 Elsevier Patient Education  [employer].   Hypoxemia  Hypoxemia occurs when the blood does not contain enough oxygen. The body cannot work well when it does not have enough oxygen because every part of the body needs oxygen.  Oxygen enters the lungs when we breathe in, then it travels to all parts of the body through the blood. Hypoxemia can develop suddenly or slowly. What are the causes? Common causes of this condition include:  Long-term (chronic) lung diseases, such as chronic obstructive pulmonary disease (COPD) or interstitial lung disease.  Disorders that affect breathing at night, such as sleep apnea.  Fluid buildup in the lungs (pulmonary edema).  Lung infection (pneumonia).  Lung or throat cancer.  Abnormal blood flow that bypasses the lungs (having a shunt).  Certain diseases that affect nerves or muscles.  A collapsed lung (pneumothorax).  A blood clot in the lungs (pulmonary embolus).  Certain types of heart disease.  Slow or shallow breathing (hypoventilation).  Certain medicines.  High altitudes.  Toxic chemicals, smoke, and gases. What are the signs or symptoms? In some cases, there may be no symptoms of this condition. If you do have symptoms, they may include:  Shortness of breath (dyspnea).  Bluish color of the skin, lips, or nail beds.  Breathing that is fast, noisy, or shallow.  A fast heartbeat.  Feeling tired or sleepy.  Feeling confused or worried. If hypoxemia develops quickly, you will likely have dyspnea. If hypoxemia develops slowly over months or years, you may not notice any symptoms. How is  this diagnosed? This condition is diagnosed by:  A physical exam.  Blood tests.  A test that measures the percentage of oxygen in your blood (pulse oximetry). This is done with a sensor that is placed on your finger, toe, or earlobe. How is this treated? Treatment for this condition depends on the underlying cause of your hypoxemia. You will likely be treated with oxygen therapy to restore your blood oxygen level. Depending on the cause of your hypoxemia, you may need oxygen therapy for a short time (weeks or months), or you may need it for the rest of your  life. Your health care provider may also recommend other therapies to treat the underlying cause of your hypoxemia. Follow these instructions at home:   Take over-the-counter and prescription medicines only as told by your health care provider.  If you are on oxygen therapy, follow oxygen safety precautions as directed by your health care provider. These may include: ? Always having a backup supply of oxygen. ? Not allowing anyone to smoke or have a fire around oxygen. ? Handling oxygen tanks carefully and as instructed.  Do not use any products that contain nicotine or tobacco, such as cigarettes and e-cigarettes. If you need help quitting, ask your health care provider. Stay away from people who smoke.  Keep all follow-up visits as told by your health care provider. This is important. Contact a health care provider if:  You have any concerns about your oxygen therapy.  You have trouble breathing, even during or after treatment.  You become short of breath when you exercise.  You are tired when you wake up.  You have a headache when you wake up. Get help right away if:  Your shortness of breath gets worse, especially with normal or minimal activity.  You have a bluish color of the skin, lips, or nail beds.  You become confused or you cannot think properly.  You cough up dark mucus or blood.  You have chest pain.  You have a fever. Summary  Hypoxemia occurs when the blood does not contain enough oxygen.  Hypoxemia may or may not cause symptoms. Often, the main symptom is shortness of breath (dyspnea).  Depending on the cause of your hypoxemia, you may need oxygen therapy for a short time (weeks or months), or you may need it for the rest of your life.  If you are on oxygen therapy, follow oxygen safety precautions as directed by your health care provider. This information is not intended to replace advice given to you by your health care provider. Make sure you discuss  any questions you have with your health care provider. Document Released: 03/06/2011 Document Revised: 06/11/2018 Document Reviewed: 07/25/2016 Elsevier Patient Education  2020 [employer].

## 2019-08-29 NOTE — Discharge Summary (Signed)
[facility] NAME: Gotham Lenning    MR#:  [id number]  DATE OF BIRTH:  [birth year]  DATE OF ADMISSION:  08/11/2019   ADMITTING PHYSICIAN: Ivor Costa, MD  DATE OF DISCHARGE: 08/28/2019  2:00 PM  PRIMARY CARE PHYSICIAN: Tonia Ghent, MD   ADMISSION DIAGNOSIS:  Hypoxia [R09.02] Multifocal pneumonia [J18.9] DISCHARGE DIAGNOSIS:  Principal Problem:   Multifocal pneumonia Active Problems:   CKD (chronic kidney disease) stage 3, GFR 30-59 ml/min   BPH with obstruction/lower urinary tract symptoms   HTN (hypertension)   Hyperlipidemia   Hypothyroidism   PAF (paroxysmal atrial fibrillation) (HCC)   CAD (coronary artery disease)   Acute kidney injury superimposed on CKD ([place])   Pneumonia due to COVID-19 virus   Normocytic anemia   Acute respiratory disease due to COVID-19 virus   Acute on chronic respiratory failure with hypoxia (HCC)   Hypoxia   Acute respiratory failure with hypoxia and hypercapnia ([place])  SECONDARY DIAGNOSIS:   Past Medical History:  Diagnosis Date  . Anemia    due to GIB, s/p transfusion  . Anginal pain ([place])   . Arthritis    back pain, much worse after consecutive golf rounds  . Cancer ([place])    thyroid  . Chronic kidney disease   . Colon polyps   . Coronary artery disease   . COVID-19   . Dyspnea    walking up a hill  . GERD (gastroesophageal reflux disease)   . Hypercalcemia    h/o, resolved as of 2012, prev due to high amount of calcium intake  . Hyperlipidemia   . Hypertension   . IgG gammopathy    stable as of 2012 per [facility]  . Pneumonia    as a child  . PVD (peripheral vascular disease) (HCC)    L leg bypass, R leg stented   HOSPITAL COURSE:  CORAY CRITCHLEY is a 78 y.o. male with medical history significant of hypertension, hyperlipidemia, GERD, hypothyroidism, PVD, CAD, CABG, anemia, CKD stage III, PAF, admitted for shortness of breath.  Patient was recently hospitalized from 11/4-11/15  due to COVID-19 infection. Pt was treated with levofloxacin, steroid and remdesivir. He was discharged with home oxygen.  Patient is now readmitted due to worsening shortness of breath and dry cough. Per report, patient had oxygen desaturation to 60% when off nasal cannula oxygen.   ED Course: pt was found to have COVID-19 Ag negative, WBC 14.8, lactic acid 1.1, troponin 13, electrolytes renal function okay, temperature 98.2, blood pressure 143/61, heart rate 78, RR 25, chest x-ray showed atypical infiltration.  CT angiogram is negative for PE, showed bilateral multifocal infiltration.   Acute on chronic hypoxic respiratory failure:  Likely multifactorial. 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  Requires 2-3L [place] at discharge. Encourage incentive spirometry.  - He will need NIV at home at discharge  Pneumonia: multifocal lobar. Likely secondary to [place].  Off antibiotics  Leukocytosis: infection vs steroid use.   Essential hypertension: continue metoprolol, amlodipine  AKI on CKD: Cr is stable.  Hyperkalemia: resolved.   Paroxysmal atrial fibrillation: Rate controlled on metoprolol.  Iron deficiency anemia:s/p 1 unit of packed red blood cells and iron during the hospital course. H&H are stable. No need for a transfusion currently  Hypothyroidism: continue on levothyroxine  BPH: continue on Flomax,Proscar   Restless leg syndrome: continue on Requip  Weakness: PT/OT now both  recommend home health. CM  have set this up  Patient's wife is very anxious which certainly could be playing a big role in him getting readmitted They were very hesitant and not wanting to leave the hospital DISCHARGE CONDITIONS:  Stable CONSULTS OBTAINED:   DRUG ALLERGIES:   Allergies  Allergen Reactions  . Penicillins Itching and Other (See Comments)    PATIENT HAS HAD A PCN REACTION WITH IMMEDIATE RASH, FACIAL/TONGUE/THROAT  SWELLING, SOB, OR LIGHTHEADEDNESS WITH HYPOTENSION:  #  #  YES  #  #  Has patient had a PCN reaction causing severe rash involving mucus membranes or skin necrosis: No Has patient had a PCN reaction that required hospitalization: No Has patient had a PCN reaction occurring within the last 10 years: No If all of the above answers are "NO", then may proceed with Cephalosporin use.   . Ace Inhibitors Cough  . Aspirin Other (See Comments)    H/o GI bleed  . Celebrex [Celecoxib] Other (See Comments)    GI bleed  . Lipitor [Atorvastatin] Other (See Comments)    myalgias   DISCHARGE MEDICATIONS:   Allergies as of 08/28/2019      Reactions   Penicillins Itching, Other (See Comments)   PATIENT HAS HAD A PCN REACTION WITH IMMEDIATE RASH, FACIAL/TONGUE/THROAT SWELLING, SOB, OR LIGHTHEADEDNESS WITH HYPOTENSION:  #  #  YES  #  #  Has patient had a PCN reaction causing severe rash involving mucus membranes or skin necrosis: No Has patient had a PCN reaction that required hospitalization: No Has patient had a PCN reaction occurring within the last 10 years: No If all of the above answers are "NO", then may proceed with Cephalosporin use.   Ace Inhibitors Cough   Aspirin Other (See Comments)   H/o GI bleed   Celebrex [celecoxib] Other (See Comments)   GI bleed   Lipitor [atorvastatin] Other (See Comments)   myalgias      Medication List    TAKE these medications   acetaminophen 500 MG tablet Commonly known as: TYLENOL Take 1,000 mg by mouth every 6 (six) hours as needed for mild pain.   amiodarone 200 MG tablet Commonly known as: PACERONE TAKE 1 TABLET BY MOUTH DAILY   amLODipine 2.5 MG tablet Commonly known as: NORVASC TAKE ONE TABLET BY MOUTH EVERY DAY What changed:   how much to take  how to take this  when to take this  additional instructions   benzonatate 200 MG capsule Commonly known as: TESSALON Take 1 capsule (200 mg total) by mouth 3 (three) times daily as needed  for cough.   clopidogrel 75 MG tablet Commonly known as: PLAVIX TAKE ONE TABLET EVERY DAY   Co-Enzyme Q-10 100 MG Caps Take 100 mg by mouth daily.   ferrous sulfate 325 (65 FE) MG tablet Take 1 tablet (325 mg total) by mouth daily with breakfast.   FISH OIL OMEGA-3 PO Take 1,400 Units by mouth daily.   levothyroxine 75 MCG tablet Commonly known as: SYNTHROID Take 75 mcg by mouth See admin instructions. Take 1 tablet (7mcg) by mouth every Monday, Tuesday, Wednesday, Friday, Saturday and Sunday morning and take 1 tablets (112.47mcg) by mouth every Thursday morning   metoprolol tartrate 25 MG tablet Commonly known as: LOPRESSOR Take 0.5 tablets (12.5 mg total) by mouth 2 (two) times daily.   multivitamin with minerals tablet Take 1 tablet by mouth daily.   pantoprazole 40 MG tablet Commonly known as: PROTONIX Take 1 tablet (40 mg  total) by mouth daily.   polyethylene glycol 17 g packet Commonly known as: MIRALAX / GLYCOLAX Take 17 g by mouth daily.   pravastatin 40 MG tablet Commonly known as: PRAVACHOL TAKE ONE TABLET BY MOUTH EVERY DAY   Repatha SureClick [id number] MG/ML [place] Generic drug: Evolocumab Inject 140 mg into the skin every 14 (fourteen) days.   rOPINIRole 0.25 MG tablet Commonly known as: REQUIP Take 1 tablet (0.25 mg total) by mouth at bedtime.   tamsulosin 0.4 MG Caps capsule Commonly known as: FLOMAX Take 1 capsule (0.4 mg total) by mouth daily.   traZODone 100 MG tablet Commonly known as: DESYREL Take 50-100 mg by mouth at bedtime as needed for sleep.   vitamin C 500 MG tablet Commonly known as: ASCORBIC ACID Take 500 mg by mouth daily.   Vitamin D 50 MCG (2000 UT) tablet Take 2,000 Units by mouth daily.      DISCHARGE INSTRUCTIONS:   DIET:  Cardiac diet DISCHARGE CONDITION:  Stable ACTIVITY:  Activity as tolerated OXYGEN:  Home Oxygen: Yes.    Oxygen Delivery: 2-3 liters/min via Patient connected to nasal cannula oxygen DISCHARGE  LOCATION:  home with home health PT, RN and palliative care to follow.  Overall poor prognosis  If you experience worsening of your admission symptoms, develop shortness of breath, life threatening emergency, suicidal or homicidal thoughts you must seek medical attention immediately by calling 911 or calling your MD immediately  if symptoms less severe.  You Must read complete instructions/literature along with all the possible adverse reactions/side effects for all the Medicines you take and that have been prescribed to you. Take any new Medicines after you have completely understood and accpet all the possible adverse reactions/side effects.   Please note  You were cared for by a hospitalist during your hospital stay. If you have any questions about your discharge medications or the care you received while you were in the hospital after you are discharged, you can call the unit and asked to speak with the hospitalist on call if the hospitalist that took care of you is not available. Once you are discharged, your primary care physician will handle any further medical issues. Please note that NO REFILLS for any discharge medications will be authorized once you are discharged, as it is imperative that you return to your primary care physician (or establish a relationship with a primary care physician if you do not have one) for your aftercare needs so that they can reassess your need for medications and monitor your lab values.    On the day of Discharge:  VITAL SIGNS:  Blood pressure 120/64, pulse 78, temperature 97.7 F (36.5 C), temperature source Oral, resp. rate 17, height 5\' 7"  (1.702 m), weight 68 kg, SpO2 99 %. PHYSICAL EXAMINATION:  GENERAL:  77 y.o.-year-old patient lying in the bed with no acute distress.  EYES: Pupils equal, round, reactive to light and accommodation. No scleral icterus. Extraocular muscles intact.  HEENT: Head atraumatic, normocephalic. Oropharynx and nasopharynx  clear.  NECK:  Supple, no jugular venous distention. No thyroid enlargement, no tenderness.  LUNGS: Normal breath sounds bilaterally, no wheezing, rales,rhonchi or crepitation. No use of accessory muscles of respiration.  CARDIOVASCULAR: S1, S2 normal. No murmurs, rubs, or gallops.  ABDOMEN: Soft, non-tender, non-distended. Bowel sounds present. No organomegaly or mass.  EXTREMITIES: No pedal edema, cyanosis, or clubbing.  NEUROLOGIC: Cranial nerves II through XII are intact. Muscle strength 5/5 in all extremities. Sensation intact. Gait not checked.  PSYCHIATRIC: The patient is alert and oriented x 3.  SKIN: No obvious rash, lesion, or ulcer.  DATA REVIEW:   CBC Recent Labs  Lab 08/27/19 0507  WBC 25.7*  HGB 9.5*  HCT 28.7*  PLT 179    Chemistries  Recent Labs  Lab 08/26/19 1257  NA 132*  K 4.8  CL 101  CO2 25  GLUCOSE 220*  BUN 40*  CREATININE 1.49*  CALCIUM 8.4*    Follow-up Information    Tonia Ghent, MD. Schedule an appointment as soon as possible for a visit on 09/04/2019.   Specialty: Family Medicine Why: @ 10:45 am Contact information: [place] [address] [phone]        Ottie Glazier, MD. Schedule an appointment as soon as possible for a visit in 2 weeks.   Specialty: Pulmonary Disease Why: Patient to call for Appt Contact information: [place] [address] [phone]           Management plans discussed with the patient, family and they are in agreement.  CODE STATUS: Prior   TOTAL TIME TAKING CARE OF THIS PATIENT: 45 minutes.    Max Sane M.D on 08/29/2019 at 2:08 PM  Between 7am to 6pm - Pager - [phone]  After 6pm go to [email or web address] - password [email or web address]  [facility]   CC: Primary care physician; Tonia Ghent, MD   Note: This dictation was prepared with Dragon dictation along with smaller phrase technology. Any transcriptional errors that result from this  process are unintentional.

## 2019-09-01 ENCOUNTER — Telehealth: Payer: Self-pay

## 2019-09-01 NOTE — Telephone Encounter (Signed)
Spoke with patient and his wife and they want to wait and discuss this with Dr Damita Dunnings on 09/04/2019 to decide if this is needed.

## 2019-09-01 NOTE — Telephone Encounter (Signed)
I am okay with this if the patient is okay with this.  Please verify with him first. Thanks.

## 2019-09-01 NOTE — Telephone Encounter (Signed)
Noted. Thanks.

## 2019-09-01 NOTE — Telephone Encounter (Signed)
Ryan Pittman with palliative care left v/m that pt was recently d/c from [facility] to pts home and a refferal with palliative care services was done by [facility]. Ryan Pittman want to know if Dr Damita Dunnings is in agreement and if so please call verbal order to [facility].

## 2019-09-04 ENCOUNTER — Other Ambulatory Visit: Payer: Self-pay

## 2019-09-04 ENCOUNTER — Encounter: Payer: Self-pay | Admitting: Family Medicine

## 2019-09-04 ENCOUNTER — Ambulatory Visit (INDEPENDENT_AMBULATORY_CARE_PROVIDER_SITE_OTHER): Payer: Medicare Other | Admitting: Family Medicine

## 2019-09-04 VITALS — BP 122/40 | HR 64 | Temp 96.4°F | Ht 67.0 in | Wt 147.1 lb

## 2019-09-04 DIAGNOSIS — Z862 Personal history of diseases of the blood and blood-forming organs and certain disorders involving the immune mechanism: Secondary | ICD-10-CM

## 2019-09-04 DIAGNOSIS — I701 Atherosclerosis of renal artery: Secondary | ICD-10-CM | POA: Diagnosis not present

## 2019-09-04 DIAGNOSIS — J189 Pneumonia, unspecified organism: Secondary | ICD-10-CM | POA: Diagnosis not present

## 2019-09-04 MED ORDER — PANTOPRAZOLE SODIUM 40 MG PO TBEC: 40.0000 mg | DELAYED_RELEASE_TABLET | Freq: Every day | ORAL | 1 refills | Status: DC | PRN

## 2019-09-04 NOTE — Progress Notes (Signed)
This visit occurred during the SARS-CoV-2 public health emergency.  Safety protocols were in place, including screening questions prior to the visit, additional usage of staff PPE, and extensive cleaning of exam room while observing appropriate contact time as indicated for disinfecting solutions.   ================= DATE OF ADMISSION:  08/11/2019      ADMITTING PHYSICIAN: Ivor Costa, MD  DATE OF DISCHARGE: 08/28/2019  2:00 PM  PRIMARY CARE PHYSICIAN: Tonia Ghent, MD   ADMISSION DIAGNOSIS:  Hypoxia [R09.02] Multifocal pneumonia [J18.9] DISCHARGE DIAGNOSIS:  Principal Problem:   Multifocal pneumonia Active Problems:   CKD (chronic kidney disease) stage 3, GFR 30-59 ml/min   BPH with obstruction/lower urinary tract symptoms   HTN (hypertension)   Hyperlipidemia   Hypothyroidism   PAF (paroxysmal atrial fibrillation) (HCC)   CAD (coronary artery disease)   Acute kidney injury superimposed on CKD ([place])   Pneumonia due to COVID-19 virus   Normocytic anemia   Acute respiratory disease due to COVID-19 virus   Acute on chronic respiratory failure with hypoxia ([place])   Hypoxia   Acute respiratory failure with hypoxia and hypercapnia ([place])  SECONDARY DIAGNOSIS:       Past Medical History:  Diagnosis Date  . Anemia    due to GIB, s/p transfusion  . Anginal pain ([place])   . Arthritis    back pain, much worse after consecutive golf rounds  . Cancer ([place])    thyroid  . Chronic kidney disease   . Colon polyps   . Coronary artery disease   . COVID-19   . Dyspnea    walking up a hill  . GERD (gastroesophageal reflux disease)   . Hypercalcemia    h/o, resolved as of 2012, prev due to high amount of calcium intake  . Hyperlipidemia   . Hypertension   . IgG gammopathy    stable as of 2012 per [facility]  . Pneumonia    as a child  . PVD (peripheral vascular disease) (HCC)    L leg bypass, R leg stented   HOSPITAL COURSE:  [place] a 78  y.o.malewith medical history significant ofhypertension, hyperlipidemia, GERD, hypothyroidism, PVD, CAD, CABG, anemia, CKD stage III, PAF, admitted for shortness of breath.  Patientwas recently hospitalized from 11/4-11/15 due to COVID-19 infection.Pt was treated with levofloxacin, steroid andremdesivir. He wasdischarged with home oxygen.Patient is now readmitted due to worsening shortness of breath and dry cough. Per report, patient had oxygen desaturation to 60% when offnasal cannula oxygen.   ED Course:pt was found to have COVID-19 Ag negative, WBC 14.8, lactic acid 1.1, troponin13, electrolytes renal function okay, temperature 98.2, blood pressure 143/61, heart rate 78, RR 25, chest x-ray showed atypical infiltration. CT angiogram is negative for PE, showed bilateral multifocal infiltration.   Acuteon chronichypoxic respiratory failure: Likely multifactorial. Prior COVID-19 infection and likely postinflammatory/pulmonaryfibrosis - Completed course of steroids.  Requires 2-3L [place] at discharge. Encourage incentive spirometry.  Pneumonia: multifocal lobar. Likely secondary to [place].Off antibiotics  Leukocytosis: infection vs steroid use.   Essential hypertension: continue metoprolol, amlodipine  AKI on CKD: Cr is stable.  Hyperkalemia: resolved.   Paroxysmal atrial fibrillation: Rate controlled on metoprolol.  Iron deficiency anemia:s/p 1 unit of packed red blood cells and iron during the hospital course. H&H are stable. No need for a transfusion currently  Hypothyroidism: continue on levothyroxine  BPH: continue on Flomax,Proscar   Restless leg syndrome: continue on Requip  Weakness: PT/OT now both recommend home health. CM have set this up ================== Inpatient  course discussed with patient.  Complicated course with initial admission related to Covid.  He improved enough for discharge and was at home for several weeks and then got  progressively ill again.  Was readmitted with multifocal pneumonia.  Treated with antibiotics and improved to the point where he can be discharged home.  Still on home O2 via nasal cannula.  He clearly feels better in the meantime.  In the meantime, he is still on amiodarone.  Lower BP noted.  Similar to today at home.  Still on O2.  He is off tessalon since his cough improved..  Using 3L O2.  Has some head congestion and throat clearing but not coughing like prev.  He is still doing IS at home.  No fevers.    He was previously treated with steroids and had been concurrently treated with PPI.  Discussed options.  He may be able to stop PPI at this point.  He isn't have GERD sx.    He is off repatha at this point, temporarily held, and we discussed that it was reasonable to hold this temporarily.    He has a supplier for O2.  He may not need HH at this point.  D/w pt.  We talked about Palliative care referral in general but he may not need that at this point. He had an O2 concentrator that malfunctioned.  He is on O2 via tank at this point, using nasal canula.  He has been using [employer].    PMH and SH reviewed  ROS: Per HPI unless specifically indicated in ROS section   Meds, vitals, and allergies reviewed.   GEN: nad, alert and oriented HEENT: ncat, on nasal cannula O2 NECK: supple w/o LA CV: rrr PULM: ctab, no inc wob ABD: soft, +bs EXT: Trace  BLE edema SKIN: no acute rash Speaking in complete sentences.

## 2019-09-04 NOTE — Patient Instructions (Addendum)
Go to the lab on the way out.  We'll contact you with your lab report.  Stop amlodipine for now.  Update me about your BP and swelling next week.   Continue O2 at 3L for now.  I'll await the pulmonary notes.  Use pantoprazole daily if needed for heartburn.   Okay to defer home health for now.  Please ask the O2 supplier if they have a smaller concentrator that you can use.   Take care.  Glad to see you.

## 2019-09-05 LAB — COMPREHENSIVE METABOLIC PANEL
AG Ratio: 1.6 (calc) (ref 1.0–2.5)
ALT: 22 U/L (ref 9–46)
AST: 21 U/L (ref 10–35)
Albumin: 3 g/dL — ABNORMAL LOW (ref 3.6–5.1)
Alkaline phosphatase (APISO): 41 U/L (ref 35–144)
BUN: 17 mg/dL (ref 7–25)
CO2: 24 mmol/L (ref 20–32)
Calcium: 8.1 mg/dL — ABNORMAL LOW (ref 8.6–10.3)
Chloride: 108 mmol/L (ref 98–110)
Creat: 0.93 mg/dL (ref 0.70–1.11)
Globulin: 1.9 g/dL (calc) (ref 1.9–3.7)
Glucose, Bld: 162 mg/dL — ABNORMAL HIGH (ref 65–99)
Potassium: 4.6 mmol/L (ref 3.5–5.3)
Sodium: 140 mmol/L (ref 135–146)
Total Bilirubin: 0.3 mg/dL (ref 0.2–1.2)
Total Protein: 4.9 g/dL — ABNORMAL LOW (ref 6.1–8.1)

## 2019-09-05 LAB — CBC WITH DIFFERENTIAL/PLATELET
Absolute Monocytes: 883 cells/uL (ref 200–950)
Basophils Absolute: 69 cells/uL (ref 0–200)
Basophils Relative: 0.5 %
Eosinophils Absolute: 193 cells/uL (ref 15–500)
Eosinophils Relative: 1.4 %
HCT: 30.2 % — ABNORMAL LOW (ref 38.5–50.0)
Hemoglobin: 9.8 g/dL — ABNORMAL LOW (ref 13.2–17.1)
Lymphs Abs: 2180 cells/uL (ref 850–3900)
MCH: 30.6 pg (ref 27.0–33.0)
MCHC: 32.5 g/dL (ref 32.0–36.0)
MCV: 94.4 fL (ref 80.0–100.0)
MPV: 10.8 fL (ref 7.5–12.5)
Monocytes Relative: 6.4 %
Neutro Abs: 10474 cells/uL — ABNORMAL HIGH (ref 1500–7800)
Neutrophils Relative %: 75.9 %
Platelets: 193 10*3/uL (ref 140–400)
RBC: 3.2 10*6/uL — ABNORMAL LOW (ref 4.20–5.80)
RDW: 19.7 % — ABNORMAL HIGH (ref 11.0–15.0)
Total Lymphocyte: 15.8 %
WBC: 13.8 10*3/uL — ABNORMAL HIGH (ref 3.8–10.8)

## 2019-09-05 LAB — IRON: Iron: 69 ug/dL (ref 50–180)

## 2019-09-07 ENCOUNTER — Other Ambulatory Visit: Payer: Self-pay | Admitting: Family Medicine

## 2019-09-07 ENCOUNTER — Telehealth: Payer: Self-pay | Admitting: Family Medicine

## 2019-09-07 DIAGNOSIS — Z862 Personal history of diseases of the blood and blood-forming organs and certain disorders involving the immune mechanism: Secondary | ICD-10-CM

## 2019-09-07 NOTE — Assessment & Plan Note (Signed)
Multiple issues to consider.  Check routine labs today given his recent illness.  See orders.  He is clearly improved.  Would continue oxygen as is for now.  He has pulmonary follow-up pending.  Lower blood pressure noted.  Will stop amlodipine for now.  He will update me about his BP and swelling next week.  Stopping amlodipine may help with edema.  Continue O2 at 3L for now.  I'll await the pulmonary notes.  Discussed changing PPI to as needed use.  He will use pantoprazole daily if needed for heartburn.  Off steroids currently.  Okay to defer home health for now.  I asked the patient to check with the O2 supplier to see if they have a smaller concentrator that he can use.   I appreciate the help of all involved.  At least 30 minutes were devoted to patient care in this encounter (this can potentially include time spent reviewing the patient's file/history, interviewing and examining the patient, counseling/reviewing plan with patient, ordering referrals, ordering tests, reviewing relevant laboratory or x-ray data, and documenting the encounter).

## 2019-09-07 NOTE — Telephone Encounter (Signed)
L[place]- Please send a copy of his most recent labs and office visit to Dr. Lanney Gins with pulmonary.  Please call Dr. Teodoro Kil office and give him a message-I would specifically like his input on this patient continuing or stopping amiodarone.  Thanks.  I also routed this to Dr. Alvester Chou in the meantime, also for his input about amiodarone use.  I thank all involved.

## 2019-09-07 NOTE — Telephone Encounter (Signed)
See below.  I appreciate input from cardiology.  Please update patient.  Please pass this phone note along to pulmonary.

## 2019-09-07 NOTE — Telephone Encounter (Signed)
I have no problem with Me Padget stopping Amio at this time Phillip Heal

## 2019-09-09 ENCOUNTER — Other Ambulatory Visit: Payer: Self-pay | Admitting: Cardiovascular Disease

## 2019-09-09 ENCOUNTER — Other Ambulatory Visit: Payer: Self-pay | Admitting: Internal Medicine

## 2019-09-09 ENCOUNTER — Telehealth: Payer: Self-pay

## 2019-09-09 DIAGNOSIS — U071 COVID-19: Secondary | ICD-10-CM

## 2019-09-09 NOTE — Telephone Encounter (Signed)
Denise with [facility] left v/m requesting cb about verbal orders for palliative care or not. Langley Gauss said pt was to discuss with Dr Damita Dunnings  At the 09/04/19 visit. In the 09/04/19 note it had OK to defer Home Health now. Please advise.

## 2019-09-09 NOTE — Telephone Encounter (Signed)
Left detailed message on voicemail.  

## 2019-09-09 NOTE — Telephone Encounter (Signed)
Given his improvement, I think it makes sense to defer palliative care for now.  Thanks.

## 2019-09-15 NOTE — Telephone Encounter (Signed)
Telephone note, office not and lab results sent to pulmonary. Patient advised.

## 2019-09-17 DIAGNOSIS — J189 Pneumonia, unspecified organism: Secondary | ICD-10-CM | POA: Diagnosis not present

## 2019-09-18 ENCOUNTER — Ambulatory Visit: Payer: Medicare Other | Admitting: Urology

## 2019-09-25 ENCOUNTER — Other Ambulatory Visit: Payer: Self-pay | Admitting: Internal Medicine

## 2019-09-26 ENCOUNTER — Other Ambulatory Visit: Payer: Self-pay

## 2019-09-26 MED ORDER — LEVOTHYROXINE SODIUM 75 MCG PO TABS: ORAL_TABLET | ORAL | 1 refills | Status: DC

## 2019-09-26 NOTE — Telephone Encounter (Signed)
Sent. Thanks.   

## 2019-09-26 NOTE — Telephone Encounter (Signed)
Mrs. Birts advised

## 2019-09-26 NOTE — Telephone Encounter (Signed)
Ryan Pittman, patient's wife, states patient has been trying to get his Levothyroxine refilled for the past 1 week from cancer doctor with no success. Dr Dara Lords is no longer at that office and the other cancer doctor has not responded to the refill request. Patient has been out of the medication the past 2 days. Can we refill this for the patient? I confirmed that he takes 75 mcg 1 tablet daily except on Thursdays takes 1 1/2. please review.

## 2019-09-29 ENCOUNTER — Other Ambulatory Visit: Payer: Self-pay | Admitting: Family Medicine

## 2019-09-30 NOTE — Telephone Encounter (Signed)
Electronic refill request. Trazodone Last office visit:   09/04/2019 Last Filled:   #30   12 RF on 09/06/2018 Please advise.

## 2019-10-01 DIAGNOSIS — R05 Cough: Secondary | ICD-10-CM | POA: Diagnosis not present

## 2019-10-01 NOTE — Telephone Encounter (Signed)
Sent. Thanks.   

## 2019-10-07 ENCOUNTER — Other Ambulatory Visit (INDEPENDENT_AMBULATORY_CARE_PROVIDER_SITE_OTHER): Payer: Medicare Other

## 2019-10-07 ENCOUNTER — Telehealth: Payer: Self-pay

## 2019-10-07 ENCOUNTER — Other Ambulatory Visit: Payer: Self-pay

## 2019-10-07 DIAGNOSIS — Z862 Personal history of diseases of the blood and blood-forming organs and certain disorders involving the immune mechanism: Secondary | ICD-10-CM

## 2019-10-07 LAB — CBC WITH DIFFERENTIAL/PLATELET
Basophils Absolute: 0.1 10*3/uL (ref 0.0–0.1)
Basophils Relative: 1 % (ref 0.0–3.0)
Eosinophils Absolute: 0.3 10*3/uL (ref 0.0–0.7)
Eosinophils Relative: 1.8 % (ref 0.0–5.0)
HCT: 30 % — ABNORMAL LOW (ref 39.0–52.0)
Hemoglobin: 9.9 g/dL — ABNORMAL LOW (ref 13.0–17.0)
Lymphocytes Relative: 18.1 % (ref 12.0–46.0)
Lymphs Abs: 2.6 10*3/uL (ref 0.7–4.0)
MCHC: 33.1 g/dL (ref 30.0–36.0)
MCV: 96.4 fl (ref 78.0–100.0)
Monocytes Absolute: 1.1 10*3/uL — ABNORMAL HIGH (ref 0.1–1.0)
Monocytes Relative: 7.7 % (ref 3.0–12.0)
Neutro Abs: 10.2 10*3/uL — ABNORMAL HIGH (ref 1.4–7.7)
Neutrophils Relative %: 71.4 % (ref 43.0–77.0)
Platelets: 274 10*3/uL (ref 150.0–400.0)
RBC: 3.11 Mil/uL — ABNORMAL LOW (ref 4.22–5.81)
RDW: 20.2 % — ABNORMAL HIGH (ref 11.5–15.5)
WBC: 14.2 10*3/uL — ABNORMAL HIGH (ref 4.0–10.5)

## 2019-10-07 LAB — IRON: Iron: 43 ug/dL (ref 42–165)

## 2019-10-07 NOTE — Telephone Encounter (Signed)
Message was left on voicemail.

## 2019-10-07 NOTE — Telephone Encounter (Signed)
Yes, should be okay to get the vaccine after 90 days from infection.  Would get vaccinated when possible.  Thanks.

## 2019-10-07 NOTE — Telephone Encounter (Signed)
Left detailed message on voicemail of wife (DPR)

## 2019-10-07 NOTE — Telephone Encounter (Signed)
Pt's wife (DPR signed) left v/m that it has been more than 90 days since pt tested + for covid and pt's wife wants to know if Dr Damita Dunnings thinks he should get the covid vaccine. Pt's wife request cb after reviewed by Dr Damita Dunnings.

## 2019-10-07 NOTE — Telephone Encounter (Signed)
Patient's wife left a voicemail stating that she is returning Ryan Pittman's phone call.

## 2019-10-14 ENCOUNTER — Other Ambulatory Visit: Payer: Self-pay | Admitting: *Deleted

## 2019-10-14 ENCOUNTER — Other Ambulatory Visit: Payer: Self-pay

## 2019-10-14 ENCOUNTER — Ambulatory Visit (HOSPITAL_COMMUNITY)
Admission: RE | Admit: 2019-10-14 | Discharge: 2019-10-14 | Disposition: A | Discharge status: Home / Self Care | Payer: Medicare Other | Source: Ambulatory Visit | Attending: Internal Medicine | Admitting: Internal Medicine

## 2019-10-14 DIAGNOSIS — I701 Atherosclerosis of renal artery: Secondary | ICD-10-CM

## 2019-10-14 DIAGNOSIS — I150 Renovascular hypertension: Secondary | ICD-10-CM | POA: Diagnosis not present

## 2019-10-15 ENCOUNTER — Other Ambulatory Visit: Payer: Self-pay | Admitting: Family Medicine

## 2019-10-15 DIAGNOSIS — D649 Anemia, unspecified: Secondary | ICD-10-CM

## 2019-10-16 ENCOUNTER — Telehealth: Payer: Self-pay | Admitting: Radiology

## 2019-10-16 DIAGNOSIS — D472 Monoclonal gammopathy: Secondary | ICD-10-CM | POA: Diagnosis not present

## 2019-10-16 DIAGNOSIS — C73 Malignant neoplasm of thyroid gland: Secondary | ICD-10-CM | POA: Diagnosis not present

## 2019-10-16 NOTE — Telephone Encounter (Signed)
Patient needs to schedule a non fasting lab appt for the second week in March, per Dr Damita Dunnings

## 2019-10-17 DIAGNOSIS — Z23 Encounter for immunization: Secondary | ICD-10-CM | POA: Diagnosis not present

## 2019-10-22 ENCOUNTER — Other Ambulatory Visit: Payer: Self-pay | Admitting: Family Medicine

## 2019-10-22 NOTE — Telephone Encounter (Signed)
Sent. Thanks.   

## 2019-10-22 NOTE — Telephone Encounter (Signed)
Electronic refill request for Plavix Last office visit 09/04/19 Last refill 10/04/18 #90/3

## 2019-10-27 ENCOUNTER — Other Ambulatory Visit: Payer: Medicare Other

## 2019-10-30 ENCOUNTER — Encounter: Payer: Self-pay | Admitting: Urology

## 2019-10-30 ENCOUNTER — Other Ambulatory Visit: Payer: Self-pay

## 2019-10-30 ENCOUNTER — Ambulatory Visit (INDEPENDENT_AMBULATORY_CARE_PROVIDER_SITE_OTHER): Payer: Medicare Other | Admitting: Urology

## 2019-10-30 VITALS — BP 153/73 | HR 66 | Ht 67.0 in | Wt 145.0 lb

## 2019-10-30 DIAGNOSIS — N401 Enlarged prostate with lower urinary tract symptoms: Secondary | ICD-10-CM | POA: Diagnosis not present

## 2019-10-30 LAB — BLADDER SCAN AMB NON-IMAGING: Scan Result: 13

## 2019-10-30 MED ORDER — TAMSULOSIN HCL 0.4 MG PO CAPS: 0.4000 mg | ORAL_CAPSULE | Freq: Every day | ORAL | 3 refills | Status: DC

## 2019-10-30 NOTE — Progress Notes (Signed)
10/30/2019 1:35 PM   [name] [birth year] [id number]  Referring provider: Tonia Ghent, MD [address] [place],  [place] [address]  Chief Complaint  Patient presents with  . Benign Prostatic Hypertrophy    Urologic history: 1.  BPH with LUTS -Bothersome lower urinary tract symptoms 09/2018; PVR 119 mL -Started tamsulosin with improvement  HPI: 79 year-old male presents for annual follow-up.  At his visit last year he was complaining of frequency, intermittent urinary stream, weak urinary stream and sensation of incomplete emptying.  PVR was 119 mL.  He was started on tamsulosin and has noted improvement in his voiding symptoms.  Denies dysuria or gross hematuria.  No flank, abdominal or pelvic pain.  IPSS completed today was 14/35 with a QoL rated 2/6.  He was hospitalized in November and December of last year with Covid-19 pneumonia and has recovered well.  PMH: Past Medical History:  Diagnosis Date  . Anemia    due to GIB, s/p transfusion  . Anginal pain ([place])   . Arthritis    back pain, much worse after consecutive golf rounds  . Cancer ([place])    thyroid  . Chronic kidney disease   . Colon polyps   . Coronary artery disease   . COVID-19   . Dyspnea    walking up a hill  . GERD (gastroesophageal reflux disease)   . Hypercalcemia    h/o, resolved as of 2012, prev due to high amount of calcium intake  . Hyperlipidemia   . Hypertension   . IgG gammopathy    stable as of 2012 per [facility]  . Pneumonia    as a child  . PVD (peripheral vascular disease) ([place])    L leg bypass, R leg stented    Surgical History: Past Surgical History:  Procedure Laterality Date  .  dental implant left bottom-did have bleeding     . ABDOMINAL AORTOGRAM W/LOWER EXTREMITY Bilateral 06/26/2019   Procedure: ABDOMINAL AORTOGRAM W/LOWER EXTREMITY;  Surgeon: [name], MD;  Location: [facility];  Service: Cardiovascular;  Laterality: Bilateral;  . BYPASS  GRAFT     L leg  . COLONOSCOPY WITH PROPOFOL N/A 05/04/2015   Procedure: COLONOSCOPY WITH PROPOFOL;  Surgeon: Lollie Sails, MD;  Location: [facility] ENDOSCOPY;  Service: Endoscopy;  Laterality: N/A;  . CORONARY ARTERY BYPASS GRAFT N/A 07/11/2018   Procedure: CORONARY ARTERY BYPASS GRAFTING (CABG) x three, using left internal mammary artery and right leg greater saphenous vein harvested endoscopically;  Surgeon: Gaye Pollack, MD;  Location: [facility] OR;  Service: Open Heart Surgery;  Laterality: N/A;  . LEFT HEART CATH AND CORONARY ANGIOGRAPHY N/A 06/24/2018   Procedure: LEFT HEART CATH AND CORONARY ANGIOGRAPHY;  Surgeon: [name], MD;  Location: [facility];  Service: Cardiovascular;  Laterality: N/A;  . TEE WITHOUT CARDIOVERSION N/A 07/11/2018   Procedure: TRANSESOPHAGEAL ECHOCARDIOGRAM (TEE);  Surgeon: Gaye Pollack, MD;  Location: [facility];  Service: Open Heart Surgery;  Laterality: N/A;  . THYROIDECTOMY, PARTIAL  2016    Home Medications:  Allergies as of 10/30/2019      Reactions   Penicillins Itching, Other (See Comments)   PATIENT HAS HAD A PCN REACTION WITH IMMEDIATE RASH, FACIAL/TONGUE/THROAT SWELLING, SOB, OR LIGHTHEADEDNESS WITH HYPOTENSION:  #  #  YES  #  #  Has patient had a PCN reaction causing severe rash involving mucus membranes or skin necrosis: No Has patient had a PCN reaction that required  hospitalization: No Has patient had a PCN reaction occurring within the last 10 years: No If all of the above answers are "NO", then may proceed with Cephalosporin use.   Ace Inhibitors Cough   Aspirin Other (See Comments)   H/o GI bleed   Celebrex [celecoxib] Other (See Comments)   GI bleed   Lipitor [atorvastatin] Other (See Comments)   myalgias      Medication List       Accurate as of October 30, 2019  1:35 PM. If you have any questions, ask your nurse or doctor.        acetaminophen 500 MG tablet Commonly known as: TYLENOL Take 1,000 mg by mouth every 6  (six) hours as needed for mild pain.   amiodarone 200 MG tablet Commonly known as: PACERONE TAKE 1 TABLET BY MOUTH DAILY   clopidogrel 75 MG tablet Commonly known as: PLAVIX TAKE 1 TABLET BY MOUTH DAILY   Co-Enzyme Q-10 100 MG Caps Take 100 mg by mouth daily.   ferrous sulfate 325 (65 FE) MG tablet Take 1 tablet (325 mg total) by mouth daily with breakfast.   FISH OIL OMEGA-3 PO Take 1,400 Units by mouth daily.   fluticasone 110 MCG/ACT inhaler Commonly known as: FLOVENT HFA Inhale into the lungs.   levothyroxine 75 MCG tablet Commonly known as: SYNTHROID 1 tablet daily except on Thursdays take 1.5 tabs.   metoprolol tartrate 25 MG tablet Commonly known as: LOPRESSOR TAKE 1/2 TABLET TWICE DAILY   multivitamin with minerals tablet Take 1 tablet by mouth daily.   pantoprazole 40 MG tablet Commonly known as: PROTONIX Take 1 tablet (40 mg total) by mouth daily as needed (for heartburn).   pravastatin 40 MG tablet Commonly known as: PRAVACHOL TAKE ONE TABLET BY MOUTH EVERY DAY   Repatha SureClick [id number] MG/ML [facility] Generic drug: Evolocumab Inject 140 mg into the skin every 14 (fourteen) days.   rOPINIRole 0.25 MG tablet Commonly known as: REQUIP Take 1 tablet (0.25 mg total) by mouth at bedtime.   tamsulosin 0.4 MG Caps capsule Commonly known as: FLOMAX Take 1 capsule (0.4 mg total) by mouth daily.   traZODone 100 MG tablet Commonly known as: DESYREL TAKE 1/2-1 TABLET BY MOUTH AT BEDTIME ASNEEDED FOR SLEEP   vitamin C 500 MG tablet Commonly known as: ASCORBIC ACID Take 500 mg by mouth daily.   Vitamin D 50 MCG (2000 UT) tablet Take 2,000 Units by mouth daily.       Allergies:  Allergies  Allergen Reactions  . Penicillins Itching and Other (See Comments)    PATIENT HAS HAD A PCN REACTION WITH IMMEDIATE RASH, FACIAL/TONGUE/THROAT SWELLING, SOB, OR LIGHTHEADEDNESS WITH HYPOTENSION:  #  #  YES  #  #  Has patient had a PCN reaction causing severe rash  involving mucus membranes or skin necrosis: No Has patient had a PCN reaction that required hospitalization: No Has patient had a PCN reaction occurring within the last 10 years: No If all of the above answers are "NO", then may proceed with Cephalosporin use.   . Ace Inhibitors Cough  . Aspirin Other (See Comments)    H/o GI bleed  . Celebrex [Celecoxib] Other (See Comments)    GI bleed  . Lipitor [Atorvastatin] Other (See Comments)    myalgias    Family History: Family History  Problem Relation Age of Onset  . Diabetes Mother   . Stroke Father   . Colon cancer Neg Hx   . Prostate cancer Neg  Hx     Social History:  reports that he quit smoking about 11 years ago. His smoking use included cigarettes. He has quit using smokeless tobacco.  His smokeless tobacco use included chew. He reports previous alcohol use. He reports that he does not use drugs.   Physical Exam: BP (!) 153/73   Pulse 66   Ht 5\' 7"  (1.702 m)   Wt 145 lb (65.8 kg)   BMI 22.71 kg/m   Constitutional:  Alert and oriented, No acute distress. HEENT: [place] AT, moist mucus membranes.  Trachea midline, no masses. Cardiovascular: No clubbing, cyanosis, or edema. Respiratory: Normal respiratory effort, no increased work of breathing. Skin: No rashes, bruises or suspicious lesions. Neurologic: Grossly intact, no focal deficits, moving all 4 extremities. Psychiatric: Normal mood and affect.   Assessment & Plan:    - BPH with LUTS Improvement in his voiding symptoms: Alpha blockade which she desires to continue.  We discussed additional options including adding a 5-ARI and minimally invasive treatment options.  He desires to continue tamsulosin.  PVR by bladder scan today was 13 mL.  Tamsulosin was refilled.  Follow-up 1 year with bladder scan for PVR.   Abbie Sons, [place] [address] [place], [place] [address] [phone]

## 2019-11-13 IMAGING — CR DG CHEST 2V
2 series · 2 of 2 positions shown · non-contrast
Comparison: Radiographs April 02, 2018.

CLINICAL DATA: Preop for cardiac surgery.

EXAM:
CHEST - 2 VIEW

[w chest pa]
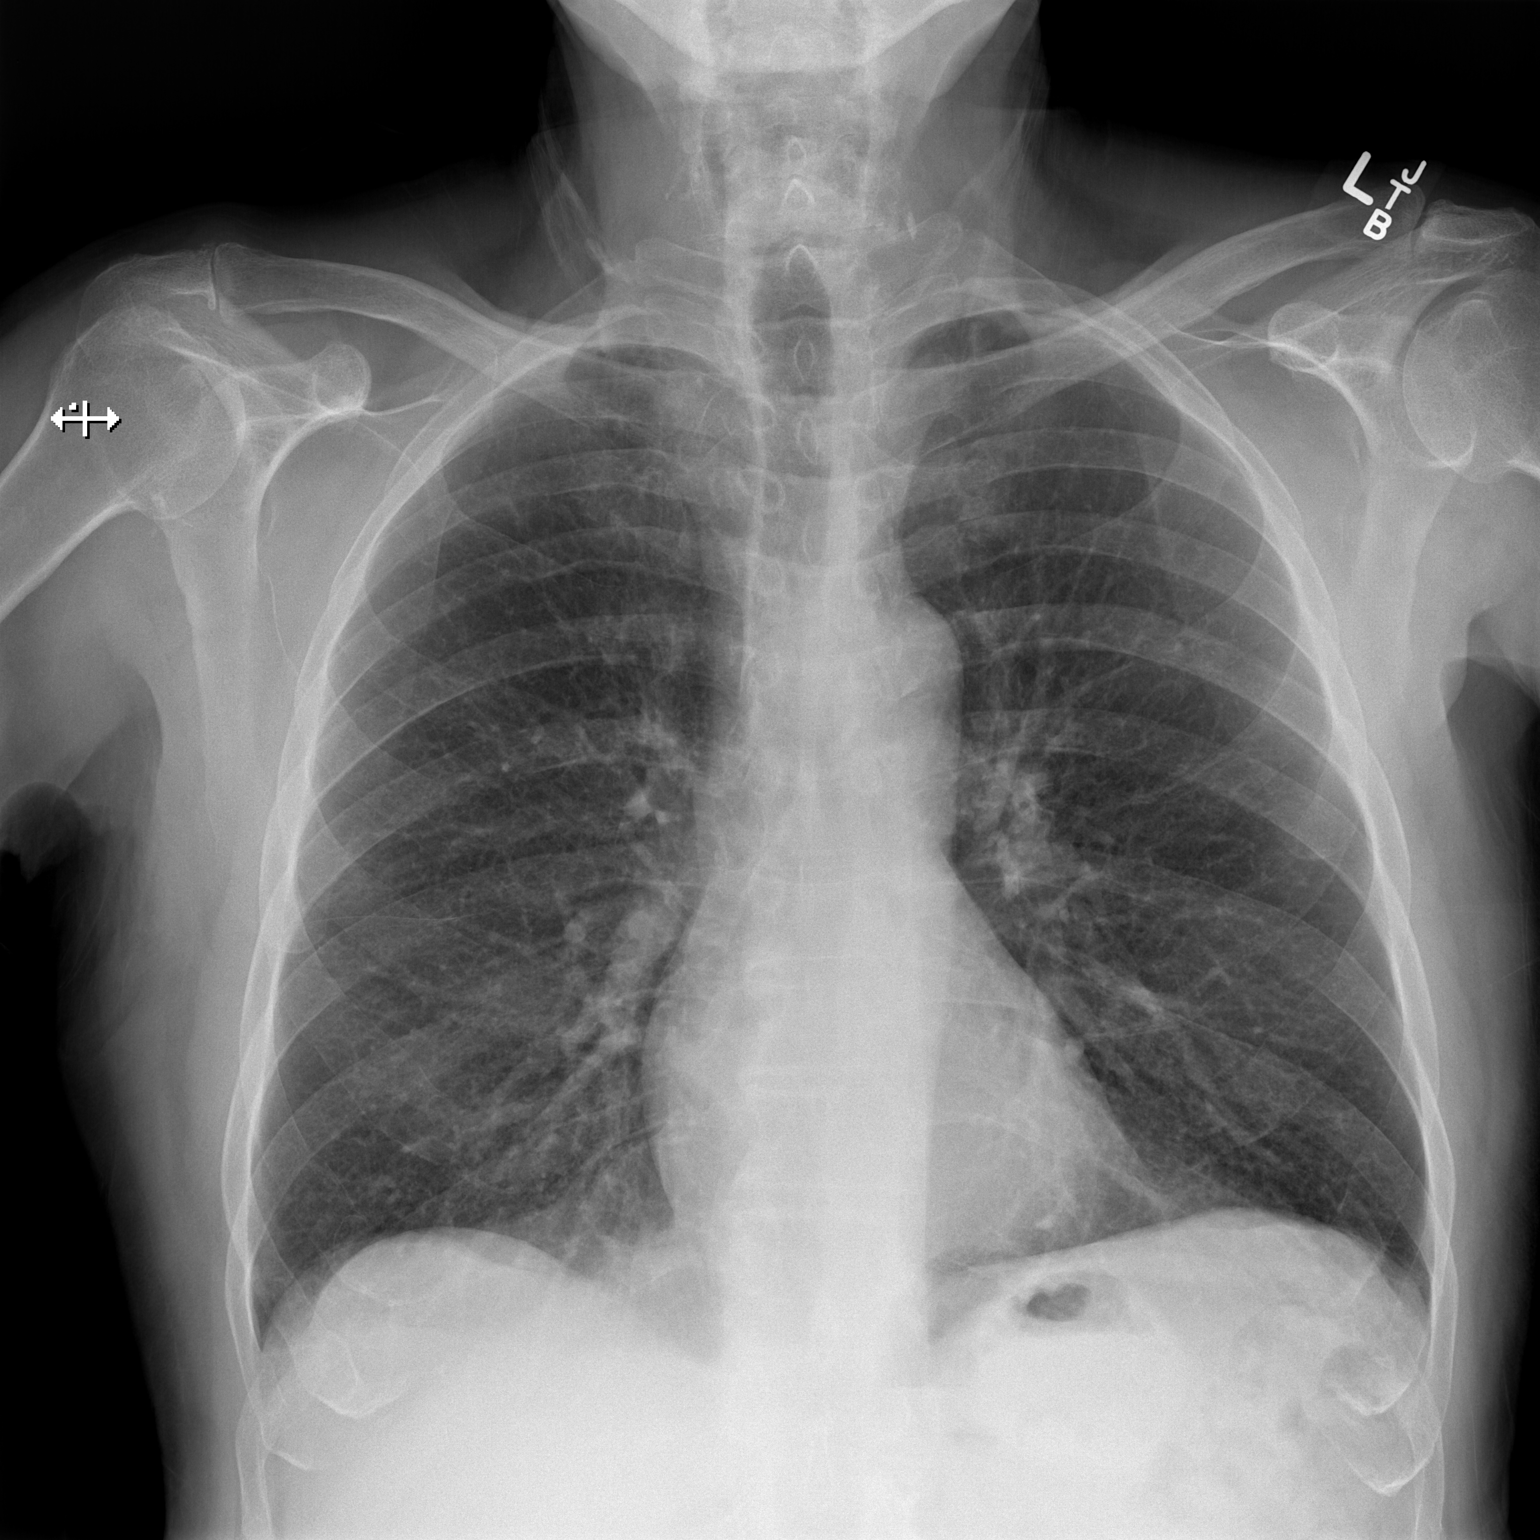

[w chest lat]
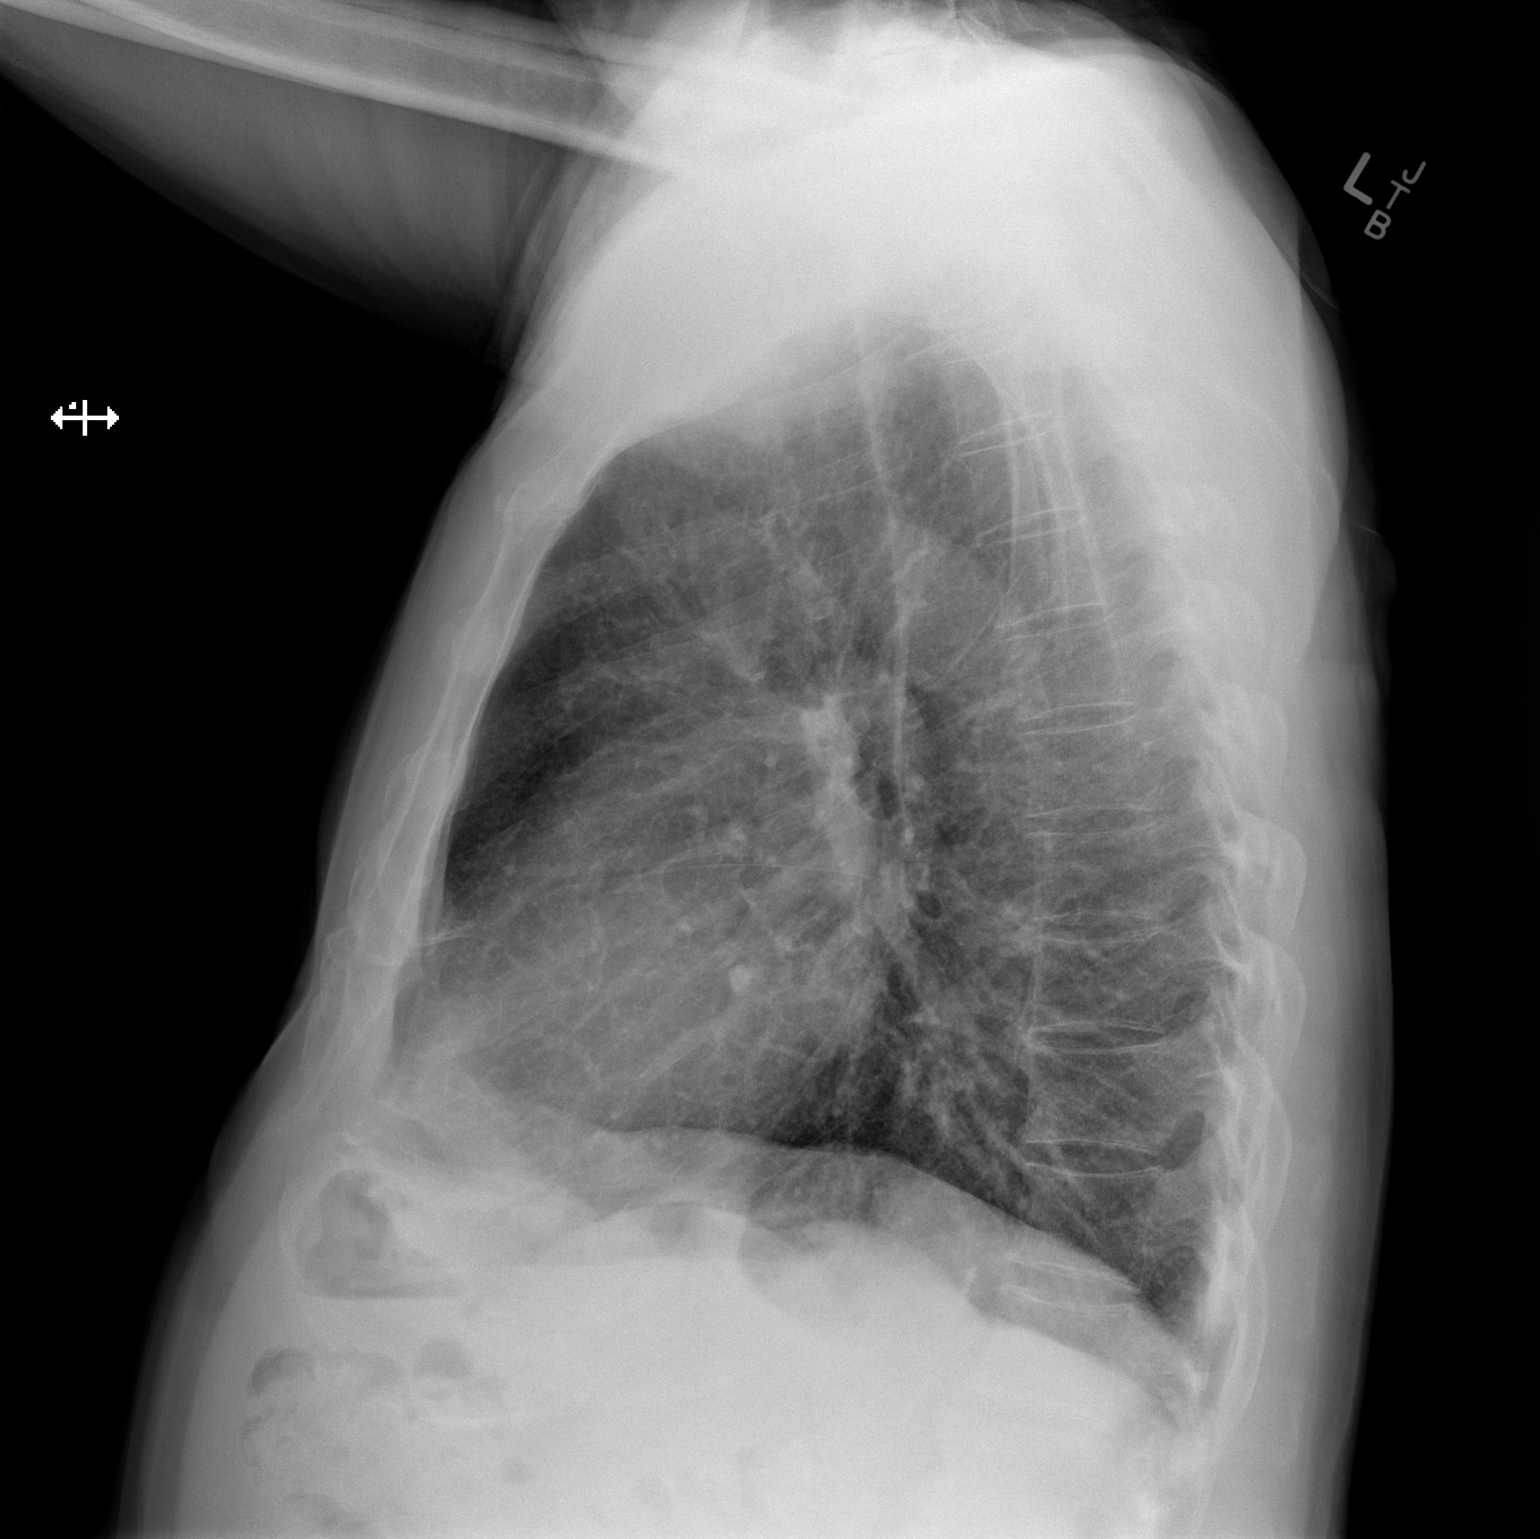

[2 of 2 positions shown; findings below may reference images not displayed]

FINDINGS: The heart size and mediastinal contours are within normal limits.
Both lungs are clear. The visualized skeletal structures are
unremarkable.
IMPRESSION: No active cardiopulmonary disease.

## 2019-11-17 IMAGING — DX DG CHEST 1V PORT
1 series · 1 of 1 positions shown · non-contrast
Comparison: 07/13/2018

CLINICAL DATA: Follow-up CABG

EXAM:
PORTABLE CHEST 1 VIEW

[chest ap]
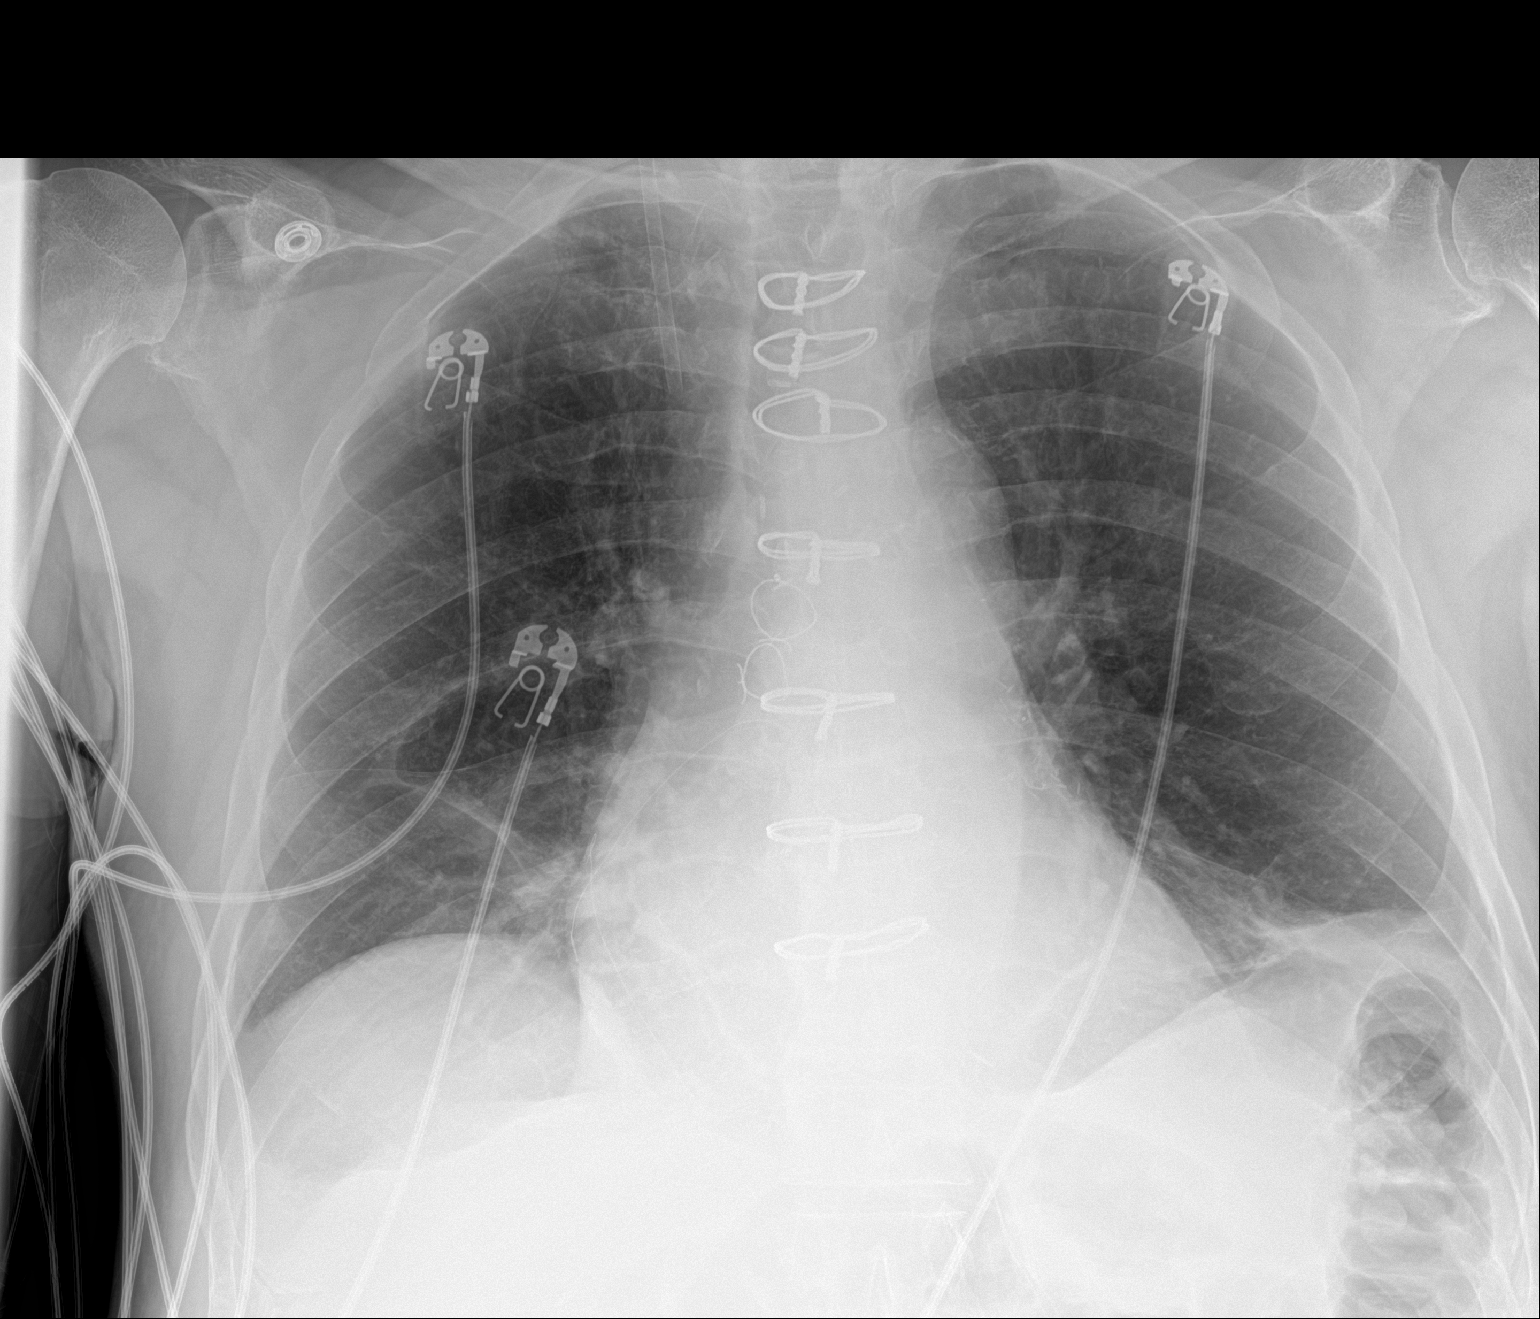

[1 of 1 positions shown; findings below may reference images not displayed]

FINDINGS: Median sternotomy and CABG. Right internal jugular venous access
sheath remains in place. Mild atelectasis in both lower lungs
persists. No worsening or new finding.
IMPRESSION: No worsening or new finding.  Mild persistent lung base atelectasis.

## 2019-11-17 IMAGING — CR DG ABD PORTABLE 1V
1 series · 1 of 1 positions shown · non-contrast
Comparison: None.

CLINICAL DATA: Abdominal distension since cardiac surgery 4 days
ago.

EXAM:
PORTABLE ABDOMEN - 1 VIEW

[AP]
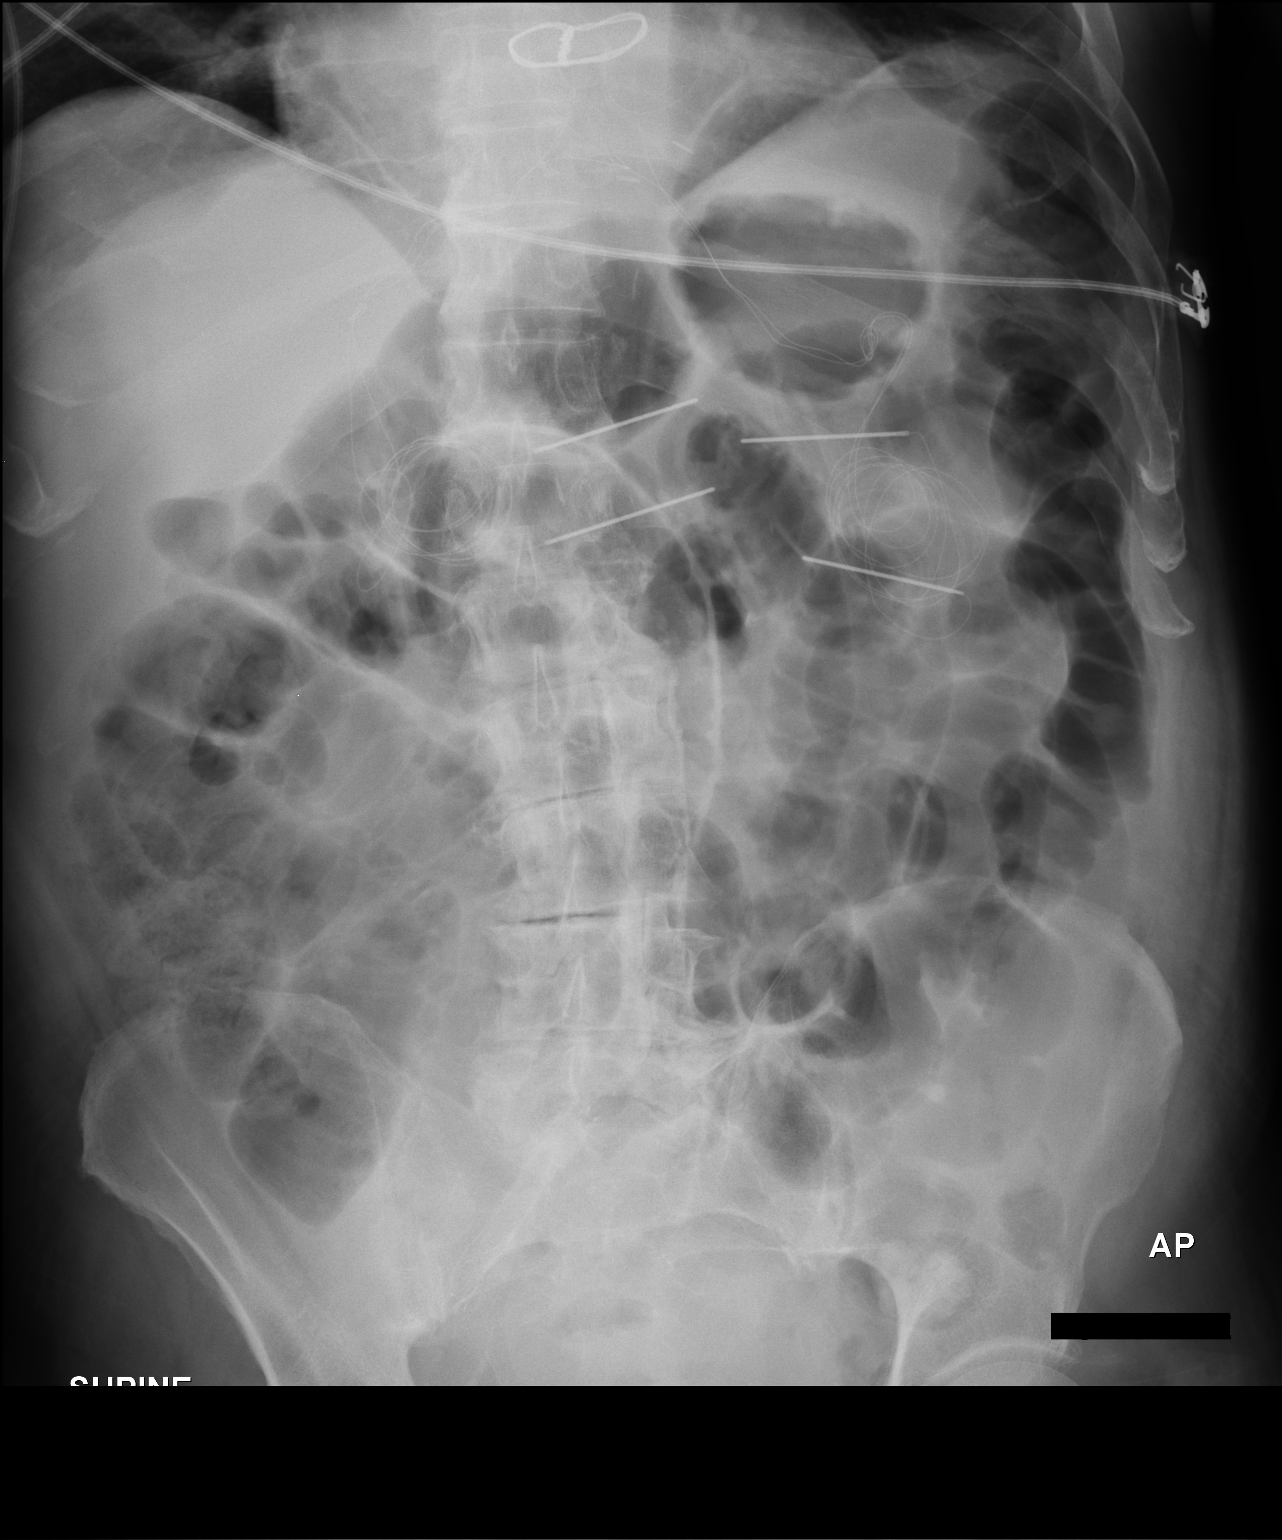

[1 of 1 positions shown; findings below may reference images not displayed]

FINDINGS: There are mildly distended loops of small bowel throughout the
abdomen. There is seen within a normal caliber colon.

Right common iliac artery stent. There are aortoiliac arterial
vascular calcifications.

Cardiac lead wires project in the upper abdomen.
IMPRESSION: 1. Mildly dilated loops of small bowel throughout the abdomen.
Findings are most likely due to an adynamic ileus given the recent
history of surgery. Low grade partial obstruction is possible but
felt less likely.

## 2019-11-18 IMAGING — DX DG CHEST 1V PORT
1 series · 1 of 1 positions shown · non-contrast
Comparison: 07/14/2018

CLINICAL DATA: Follow-up coronary bypass grafting

EXAM:
PORTABLE CHEST 1 VIEW

[chest ap]
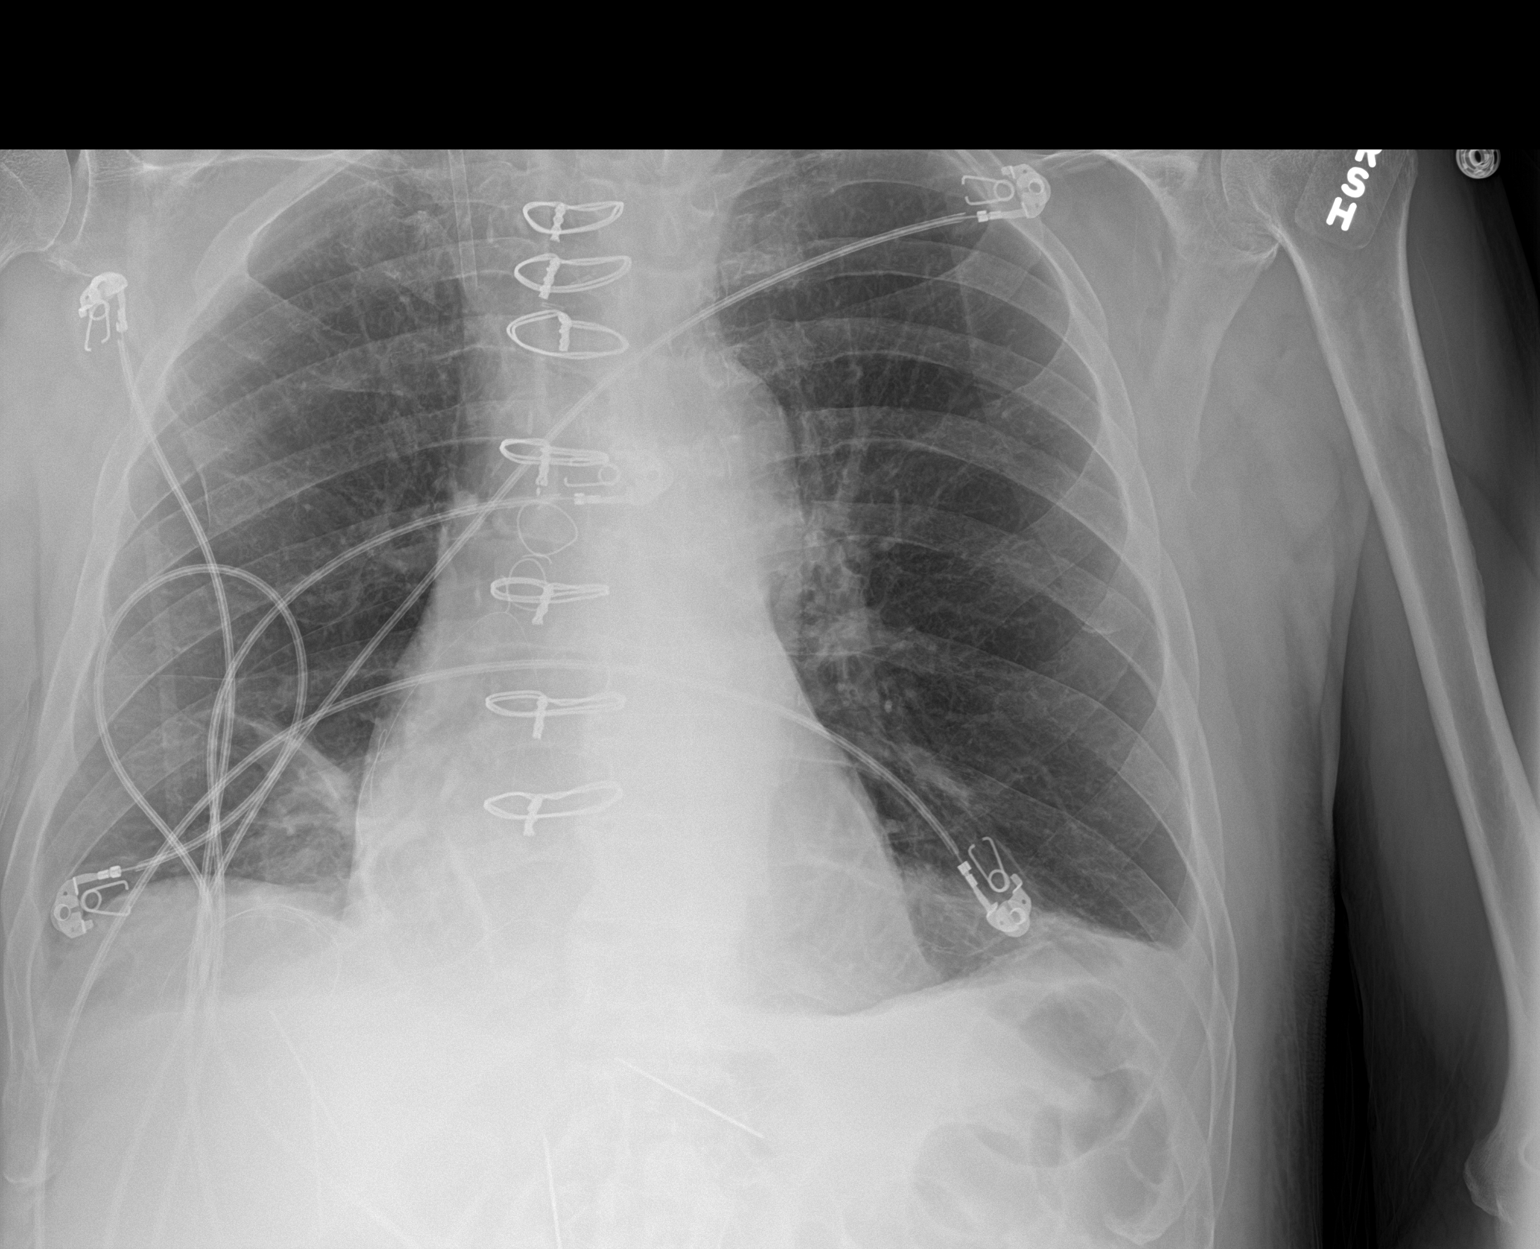

[1 of 1 positions shown; findings below may reference images not displayed]

FINDINGS: Cardiac shadow is within normal limits. Postsurgical changes are
noted. The right jugular sheath is again noted and stable. Mild
bibasilar atelectatic changes are again noted. No pneumothorax is
seen. No acute bony abnormality is noted.
IMPRESSION: No acute abnormality noted.

## 2019-11-19 ENCOUNTER — Other Ambulatory Visit: Payer: Self-pay

## 2019-11-19 ENCOUNTER — Other Ambulatory Visit (INDEPENDENT_AMBULATORY_CARE_PROVIDER_SITE_OTHER): Payer: Medicare Other

## 2019-11-19 DIAGNOSIS — D649 Anemia, unspecified: Secondary | ICD-10-CM

## 2019-11-19 DIAGNOSIS — Z23 Encounter for immunization: Secondary | ICD-10-CM | POA: Diagnosis not present

## 2019-11-19 LAB — CBC WITH DIFFERENTIAL/PLATELET
Basophils Absolute: 0 10*3/uL (ref 0.0–0.1)
Basophils Relative: 0.5 % (ref 0.0–3.0)
Eosinophils Absolute: 0.2 10*3/uL (ref 0.0–0.7)
Eosinophils Relative: 1.7 % (ref 0.0–5.0)
HCT: 35.2 % — ABNORMAL LOW (ref 39.0–52.0)
Hemoglobin: 11.8 g/dL — ABNORMAL LOW (ref 13.0–17.0)
Lymphocytes Relative: 30.8 % (ref 12.0–46.0)
Lymphs Abs: 3 10*3/uL (ref 0.7–4.0)
MCHC: 33.7 g/dL (ref 30.0–36.0)
MCV: 93.4 fl (ref 78.0–100.0)
Monocytes Absolute: 1 10*3/uL (ref 0.1–1.0)
Monocytes Relative: 10.7 % (ref 3.0–12.0)
Neutro Abs: 5.5 10*3/uL (ref 1.4–7.7)
Neutrophils Relative %: 56.3 % (ref 43.0–77.0)
Platelets: 202 10*3/uL (ref 150.0–400.0)
RBC: 3.77 Mil/uL — ABNORMAL LOW (ref 4.22–5.81)
RDW: 15.1 % (ref 11.5–15.5)
WBC: 9.7 10*3/uL (ref 4.0–10.5)

## 2019-11-19 LAB — BASIC METABOLIC PANEL
BUN: 24 mg/dL — ABNORMAL HIGH (ref 6–23)
CO2: 26 mEq/L (ref 19–32)
Calcium: 8.8 mg/dL (ref 8.4–10.5)
Chloride: 103 mEq/L (ref 96–112)
Creatinine, Ser: 1.14 mg/dL (ref 0.40–1.50)
GFR: 61.7 mL/min (ref >60.00)
Glucose, Bld: 126 mg/dL — ABNORMAL HIGH (ref 70–99)
Potassium: 4 mEq/L (ref 3.5–5.1)
Sodium: 136 mEq/L (ref 135–145)

## 2019-11-19 LAB — IRON: Iron: 49 ug/dL (ref 42–165)

## 2019-11-24 ENCOUNTER — Other Ambulatory Visit: Payer: Self-pay | Admitting: Family Medicine

## 2019-11-24 DIAGNOSIS — D649 Anemia, unspecified: Secondary | ICD-10-CM

## 2019-12-01 ENCOUNTER — Telehealth: Payer: Self-pay | Admitting: Cardiovascular Disease

## 2019-12-01 NOTE — Telephone Encounter (Signed)
Tried to CB pt, phone went directly to [id number]

## 2019-12-01 NOTE — Telephone Encounter (Signed)
   Pt's wife calling, she said after pt's aortogram on 06/26/19 he was unable to come in for f/u due to pt had covid and was in the hospital. Because of that they never discuss Dr. Gwenlyn Found findings nor f/u, pt is better now from covid, however, he is having problem with his veins in his legs and has trouble walking, she would like to discuss it with Dr. Kennon Holter nurse.  Please call

## 2019-12-01 NOTE — Telephone Encounter (Signed)
Returned call to Wife she states that pt had to cancel f/u appt in October with Dr after his procedure because he had COVID and was "in and out of [employer] 3 times" d/t COVID and now he is finally better and he needs follow up ASAP because he is have pain in his LE. I have made an appt for 12-05-2019 to discuss pain and have made an appt next available appt with JB just in case it is needed after NP appt. Per JB procedure [id number]):  I will see him back in the office next week and follow-up at which time we will discuss revascularization options.

## 2019-12-03 ENCOUNTER — Telehealth: Payer: Self-pay | Admitting: Cardiovascular Disease

## 2019-12-03 NOTE — Telephone Encounter (Signed)
Patient's wife called because she received a call from the office - unsure of what it was in regards to. Did not see any notes - assume it is to confirm appt for 12/09/19 with  Coletta Memos?

## 2019-12-07 NOTE — Telephone Encounter (Signed)
Pt needs to see me back on the office for F/U within the next week or so

## 2019-12-08 NOTE — Progress Notes (Signed)
Cardiology Clinic Note   Patient Name: Ryan Pittman Date of Encounter: 12/09/2019  Primary Care Provider:  Tonia Ghent, MD Primary Cardiologist:  [name], MD  Patient Profile    Ryan Ores. 8 79 year old male presents the clinic today for an evaluation of his lower extremity pain and to discuss the results of his angiography.  Past Medical History    Past Medical History:  Diagnosis Date  . Anemia    due to GIB, s/p transfusion  . Anginal pain ([place])   . Arthritis    back pain, much worse after consecutive golf rounds  . Cancer ([place])    thyroid  . Chronic kidney disease   . Colon polyps   . Coronary artery disease   . COVID-19   . Dyspnea    walking up a hill  . GERD (gastroesophageal reflux disease)   . Hypercalcemia    h/o, resolved as of 2012, prev due to high amount of calcium intake  . Hyperlipidemia   . Hypertension   . IgG gammopathy    stable as of 2012 per [facility]  . Pneumonia    as a child  . PVD (peripheral vascular disease) ([place])    L leg bypass, R leg stented   Past Surgical History:  Procedure Laterality Date  .  dental implant left bottom-did have bleeding     . ABDOMINAL AORTOGRAM W/LOWER EXTREMITY Bilateral 06/26/2019   Procedure: ABDOMINAL AORTOGRAM W/LOWER EXTREMITY;  Surgeon: [name], MD;  Location: [facility];  Service: Cardiovascular;  Laterality: Bilateral;  . BYPASS GRAFT     L leg  . COLONOSCOPY WITH PROPOFOL N/A 05/04/2015   Procedure: COLONOSCOPY WITH PROPOFOL;  Surgeon: Lollie Sails, MD;  Location: [facility];  Service: Endoscopy;  Laterality: N/A;  . CORONARY ARTERY BYPASS GRAFT N/A 07/11/2018   Procedure: CORONARY ARTERY BYPASS GRAFTING (CABG) x three, using left internal mammary artery and right leg greater saphenous vein harvested endoscopically;  Surgeon: Gaye Pollack, MD;  Location: [facility];  Service: Open Heart Surgery;  Laterality: N/A;  . LEFT HEART CATH AND CORONARY ANGIOGRAPHY N/A  06/24/2018   Procedure: LEFT HEART CATH AND CORONARY ANGIOGRAPHY;  Surgeon: [name], MD;  Location: [facility];  Service: Cardiovascular;  Laterality: N/A;  . TEE WITHOUT CARDIOVERSION N/A 07/11/2018   Procedure: TRANSESOPHAGEAL ECHOCARDIOGRAM (TEE);  Surgeon: Gaye Pollack, MD;  Location: [facility];  Service: Open Heart Surgery;  Laterality: N/A;  . THYROIDECTOMY, PARTIAL  2016    Allergies  Allergies  Allergen Reactions  . Penicillins Itching and Other (See Comments)    PATIENT HAS HAD A PCN REACTION WITH IMMEDIATE RASH, FACIAL/TONGUE/THROAT SWELLING, SOB, OR LIGHTHEADEDNESS WITH HYPOTENSION:  #  #  YES  #  #  Has patient had a PCN reaction causing severe rash involving mucus membranes or skin necrosis: No Has patient had a PCN reaction that required hospitalization: No Has patient had a PCN reaction occurring within the last 10 years: No If all of the above answers are "NO", then may proceed with Cephalosporin use.   . Ace Inhibitors Cough  . Aspirin Other (See Comments)    H/o GI bleed  . Celebrex [Celecoxib] Other (See Comments)    GI bleed  . Lipitor [Atorvastatin] Other (See Comments)    myalgias    History of Present Illness    Ryan Pittman has a past medical history of peripheral vascular disease, carotid stenosis, hypertension, renovascular hypertension,  renal artery stenosis, paroxysmal atrial fibrillation, CKD 3, bypass graft lower extremity, HLD, CABG, COVID-19 infection, and coronary artery disease.  He was initially referred by his PCP for cardiac evaluation for chest pain.  He was seen by Dr. Gwenlyn Found on 6/20.  His risk factors included tobacco abuse, treated hypertension and hyperlipidemia.  He had no family history of cardiovascular disease.  He has never sustained a heart attack or stroke.  He was however noted to have chest discomfort that began several months ago occurring on a monthly basis that would last minutes at a time.  He had a left  femoropopliteal bypass grafting several years prior by Dr. Lucky Cowboy in [place].  He had a positive stress test and subsequent coronary CTA 9/19.  This showed a calcium score of 2858 with three-vessel calcification.  He underwent cardiac catheterization 10/19 that showed three-vessel disease with preserved LVEF.  He then underwent CABG x5 by Dr. Cyndia Bent 11/19.    He was seen by Dr. Gwenlyn Found on 06/19/2019.  He was noted to have lifestyle limiting right calf claudication.  His lower extremity Dopplers performed 12/19 showed right ABI 0.78 and left 2.90.  Patent left femoropopliteal bypass graft and short segment occlusion with distal right common femoral artery and high-grade disease of the proximal right SFA.  A angiography was then performed on 06/26/2019.  His angiography showed common femoral ostial SFA and profundus stenosis that was not safely amenable to endovascular treatment.  Endarterectomy and patch angioplasty was recommended.  Unfortunately he was hospitalized with COVID-19 infection and was not able to follow-up.  He presents the clinic today to discuss revascularization options and angiography.  He states he acquired a COVID-19 infection after his angiography last year and was unable to follow-up.  He is still experiencing right calf claudication that is been lifestyle limiting.  He states the most activity he does includes walking up 3 flights of stairs 5 days a week for his job.  He would like to get back to playing golf.  I included his wife in the visit via phone and explained the angiography and recommendations from Dr. Gwenlyn Found.  Him and his wife expressed understanding and would like consultation with vascular surgery.  I will have him follow-up with Dr. Gwenlyn Found after the procedure.  Today he  denies chest pain, shortness of breath, lower extremity edema, fatigue, palpitations, melena, hematuria, hemoptysis, diaphoresis, weakness, presyncope, syncope, orthopnea, and PND.   Home Medications      Prior to Admission medications   Medication Sig Start Date End Date Taking? Authorizing Provider  acetaminophen (TYLENOL) 500 MG tablet Take 1,000 mg by mouth every 6 (six) hours as needed for mild pain.    [provider]  amiodarone (PACERONE) 200 MG tablet TAKE 1 TABLET BY MOUTH DAILY 02/24/19   [name], MD  Cholecalciferol (VITAMIN D) 50 MCG (2000 UT) tablet Take 2,000 Units by mouth daily.    [provider]  clopidogrel (PLAVIX) 75 MG tablet TAKE 1 TABLET BY MOUTH DAILY 10/22/19   Tonia Ghent, MD  Co-Enzyme Q-10 100 MG CAPS Take 100 mg by mouth daily.     [provider]  Evolocumab (REPATHA SURECLICK) XX123456 MG/ML SOAJ Inject 140 mg into the skin every 14 (fourteen) days. Patient not taking: Reported on 09/04/2019 02/17/19   [name], MD  ferrous sulfate 325 (65 FE) MG tablet Take 1 tablet (325 mg total) by mouth daily with breakfast. 08/07/19   Tonia Ghent, MD  fluticasone (FLOVENT HFA) 110 MCG/ACT inhaler Inhale into the lungs. 09/17/19 09/16/20  [provider]  levothyroxine (SYNTHROID) 75 MCG tablet 1 tablet daily except on Thursdays take 1.5 tabs. 09/26/19   Tonia Ghent, MD  metoprolol tartrate (LOPRESSOR) 25 MG tablet TAKE 1/2 TABLET TWICE DAILY 09/09/19   [name], MD  Multiple Vitamins-Minerals (MULTIVITAMIN WITH MINERALS) tablet Take 1 tablet by mouth daily. 07/19/19 07/18/20  Bonnielee Haff, MD  Omega-3 Fatty Acids (FISH OIL OMEGA-3 PO) Take 1,400 Units by mouth daily.     [provider]  pantoprazole (PROTONIX) 40 MG tablet Take 1 tablet (40 mg total) by mouth daily as needed (for heartburn). 09/04/19 10/04/19  Tonia Ghent, MD  pravastatin (PRAVACHOL) 40 MG tablet TAKE ONE TABLET BY MOUTH EVERY DAY 06/10/19   Tonia Ghent, MD  rOPINIRole (REQUIP) 0.25 MG tablet Take 1 tablet (0.25 mg total) by mouth at bedtime. 08/28/19   Max Sane, MD  tamsulosin (FLOMAX) 0.4 MG CAPS capsule Take 1 capsule  (0.4 mg total) by mouth daily. 10/30/19   Abbie Sons, MD  traZODone (DESYREL) 100 MG tablet TAKE 1/2-1 TABLET BY MOUTH AT BEDTIME ASNEEDED FOR SLEEP 10/01/19   Tonia Ghent, MD  vitamin C (ASCORBIC ACID) 500 MG tablet Take 500 mg by mouth daily.    [provider]    Family History    Family History  Problem Relation Age of Onset  . Diabetes Mother   . Stroke Father   . Colon cancer Neg Hx   . Prostate cancer Neg Hx    He indicated that his mother is deceased. He indicated that his father is deceased. He indicated that his sister is alive. He indicated that only one of his two brothers is alive. He indicated that the status of his neg hx is unknown.  Social History    Social History   Socioeconomic History  . Marital status: Married    Spouse name: Not on file  . Number of children: Not on file  . Years of education: Not on file  . Highest education level: Not on file  Occupational History  . Not on file  Tobacco Use  . Smoking status: Former Smoker    Types: Cigarettes    Quit date: 09/04/2008    Years since quitting: 11.2  . Smokeless tobacco: Former Systems developer    Types: Chew  . Tobacco comment: HAs quit tobacco products 2009  Substance and Sexual Activity  . Alcohol use: Not Currently    Alcohol/week: 0.0 standard drinks  . Drug use: No  . Sexual activity: Yes  Other Topics Concern  . Not on file  Social History Narrative   Married 50+ years, 2 kids   [place] to play golf   Social Determinants of Health   Financial Resource Strain: Low Risk   . Difficulty of Paying Living Expenses: Not hard at all  Food Insecurity: No Food Insecurity  . Worried About Charity fundraiser in the Last Year: Never true  . Ran Out of Food in the Last Year: Never true  Transportation Needs: No Transportation Needs  . Lack of Transportation (Medical): No  . Lack of Transportation (Non-Medical): No  Physical Activity: Insufficiently Active  . Days of  Exercise per Week: 7 days  . Minutes of Exercise per Session: 10 min  Stress: No Stress Concern Present  . Feeling of Stress : Not at all  Social Connections:   .  Frequency of Communication with Friends and Family:   . Frequency of Social Gatherings with Friends and Family:   . Attends Religious Services:   . Active Member of Clubs or Organizations:   . Attends Archivist Meetings:   [name] Marital Status:   Intimate Partner Violence: Not At Risk  . Fear of Current or Ex-Partner: No  . Emotionally Abused: No  . Physically Abused: No  . Sexually Abused: No     Review of Systems    General:  No chills, fever, night sweats or weight changes.  Cardiovascular:  No chest pain, dyspnea on exertion, edema, orthopnea, palpitations, paroxysmal nocturnal dyspnea. Dermatological: No rash, lesions/masses Respiratory: No cough, dyspnea Urologic: No hematuria, dysuria Abdominal:   No nausea, vomiting, diarrhea, bright red blood per rectum, melena, or hematemesis Neurologic:  No visual changes, wkns, changes in mental status. All other systems reviewed and are otherwise negative except as noted above.  Physical Exam    VS:  BP (!) 138/56   Pulse 63   Ht 5\' 7"  (1.702 m)   Wt 156 lb (70.8 kg)   SpO2 95%   BMI 24.43 kg/m  , BMI Body mass index is 24.43 kg/m. GEN: Well nourished, well developed, in no acute distress. HEENT: normal. Neck: Supple, no JVD, carotid bruits, or masses. Cardiac: RRR, no murmurs, rubs, or gallops. No clubbing, cyanosis, edema.  Radials/DP/PT 1+ and equal bilaterally.  Respiratory:  Respirations regular and unlabored, clear to auscultation bilaterally. GI: Soft, nontender, nondistended, BS + x 4. MS: no deformity or atrophy. Skin: warm and dry, no rash. Neuro:  Strength and sensation are intact. Psych: Normal affect.  Accessory Clinical Findings    ECG personally reviewed by me today-none today.  Abdominal aortogram with lower extremity, PV angiogram  06/26/2019  IMPRESSION:   Mr. Hertenstein has a high-grade calcified distal right common femoral ostial SFA and profunda stenosis not safely amenable to endovascular treatment.  He will need endarterectomy and patch angioplasty.  The sheath was removed and gentle pressure was placed on the left common femoral puncture site to achieve hemostasis.  Patient will be discharged home as an outpatient.  I will see him back in the office next week and follow-up at which time we will discuss revascularization options.  He left the lab in stable condition.   Assessment & Plan   1.  Peripheral vascular disease-continues to have right lower extremity lifestyle limiting claudication.  Angiography showed common femoral ostial SFA and profundus stenosis that was not safely amenable to endovascular treatment.  Endarterectomy and patch angioplasty was recommended.  Dr. Gwenlyn Found consulted today for further recommendations. Refer to vascular surgery Dr. Trula Slade for distal right endarterectomy with patch angioplasty.  Essential hypertension-BP today 138/56.  Well-controlled at home Continue amlodipine Continue losartan Continue metoprolol Heart healthy low-sodium diet-salty 6 given Increase physical activity as tolerated  Hyperlipidemia-04/17/2019: Cholesterol 218; HDL 64.00; LDL Cholesterol 118; VLDL 36.2 08/11/2019: Triglycerides 98 Continue Repatha Continue pravastatin Heart healthy low-sodium high-fiber diet Increase physical activity as tolerated  Disposition: Follow-up with Dr. Gwenlyn Found in 6 months.  Jossie Ng. [facility] [address] Office [phone] Fax [phone]

## 2019-12-09 ENCOUNTER — Other Ambulatory Visit: Payer: Self-pay

## 2019-12-09 ENCOUNTER — Ambulatory Visit (INDEPENDENT_AMBULATORY_CARE_PROVIDER_SITE_OTHER): Payer: Medicare Other | Admitting: General Practice

## 2019-12-09 ENCOUNTER — Encounter: Payer: Self-pay | Admitting: General Practice

## 2019-12-09 VITALS — BP 138/56 | HR 63 | Ht 67.0 in | Wt 156.0 lb

## 2019-12-09 DIAGNOSIS — I739 Peripheral vascular disease, unspecified: Secondary | ICD-10-CM

## 2019-12-09 DIAGNOSIS — I10 Essential (primary) hypertension: Secondary | ICD-10-CM | POA: Diagnosis not present

## 2019-12-09 DIAGNOSIS — E782 Mixed hyperlipidemia: Secondary | ICD-10-CM | POA: Diagnosis not present

## 2019-12-09 NOTE — Patient Instructions (Signed)
Medication Instructions:  The current medical regimen is effective;  continue present plan and medications as directed. Please refer to the Current Medication list given to you today. *If you need a refill on your cardiac medications before your next appointment, please call your pharmacy*  Testing/Procedures: REFER TO [facility]-VASCULAR AND VEIN DR Elvina Sidle DISCUSS ENTERECTOMY SURGERY   Follow-Up: MAY KEEP UPCOMING APPOINTMENT WITH DR BERRY TO DISCUSS SURGERY AFTER REFERRAL. PLEASE CANCEL THIS APPOINTMENT IF NOT NEEDED.  Your next appointment:  6 month(s) Please call our office 2 months in advance to schedule this appointment In Person with You may see [name], MD or one of the following Advanced Practice Providers on your designated Care Team:  Coletta Memos, [place], PA-C  [place], [place].  At [facility], you and your health needs are our priority.  As part of our continuing mission to provide you with exceptional heart care, we have created designated Provider Care Teams.  These Care Teams include your primary Cardiologist (physician) and Advanced Practice Providers (APPs -  Physician Assistants and Nurse Practitioners) who all work together to provide you with the care you need, when you need it.  We recommend signing up for the patient portal called "MyChart".  Sign up information is provided on this After Visit Summary.  MyChart is used to connect with patients for Virtual Visits (Telemedicine).  Patients are able to view lab/test results, encounter notes, upcoming appointments, etc.  Non-urgent messages can be sent to your provider as well.   To learn more about what you can do with MyChart, go to [email or web address].

## 2019-12-16 DIAGNOSIS — M545 Low back pain: Secondary | ICD-10-CM | POA: Diagnosis not present

## 2019-12-18 DIAGNOSIS — D631 Anemia in chronic kidney disease: Secondary | ICD-10-CM | POA: Diagnosis not present

## 2019-12-18 DIAGNOSIS — R809 Proteinuria, unspecified: Secondary | ICD-10-CM | POA: Diagnosis not present

## 2019-12-18 DIAGNOSIS — I10 Essential (primary) hypertension: Secondary | ICD-10-CM | POA: Diagnosis not present

## 2019-12-18 DIAGNOSIS — N1831 Chronic kidney disease, stage 3a: Secondary | ICD-10-CM | POA: Diagnosis not present

## 2019-12-29 DIAGNOSIS — E890 Postprocedural hypothyroidism: Secondary | ICD-10-CM | POA: Diagnosis not present

## 2019-12-30 ENCOUNTER — Other Ambulatory Visit: Payer: Self-pay | Admitting: Physical Medicine & Rehabilitation

## 2019-12-30 ENCOUNTER — Ambulatory Visit: Payer: Medicare Other | Admitting: Cardiovascular Disease

## 2019-12-30 DIAGNOSIS — M5416 Radiculopathy, lumbar region: Secondary | ICD-10-CM

## 2020-01-12 ENCOUNTER — Ambulatory Visit
Admission: RE | Admit: 2020-01-12 | Discharge: 2020-01-12 | Disposition: A | Discharge status: Home / Self Care | Payer: Medicare Other | Source: Ambulatory Visit | Attending: Physical Medicine & Rehabilitation | Admitting: Physical Medicine & Rehabilitation

## 2020-01-12 ENCOUNTER — Other Ambulatory Visit: Payer: Self-pay

## 2020-01-12 DIAGNOSIS — M5416 Radiculopathy, lumbar region: Secondary | ICD-10-CM | POA: Diagnosis not present

## 2020-01-12 DIAGNOSIS — M545 Low back pain: Secondary | ICD-10-CM | POA: Diagnosis not present

## 2020-01-20 ENCOUNTER — Other Ambulatory Visit: Payer: Self-pay | Admitting: *Deleted

## 2020-01-20 DIAGNOSIS — M48062 Spinal stenosis, lumbar region with neurogenic claudication: Secondary | ICD-10-CM | POA: Insufficient documentation

## 2020-01-20 DIAGNOSIS — I739 Peripheral vascular disease, unspecified: Secondary | ICD-10-CM

## 2020-01-23 ENCOUNTER — Telehealth (HOSPITAL_COMMUNITY): Payer: Self-pay

## 2020-01-23 NOTE — Telephone Encounter (Signed)

## 2020-01-26 ENCOUNTER — Other Ambulatory Visit: Payer: Self-pay

## 2020-01-26 ENCOUNTER — Ambulatory Visit (INDEPENDENT_AMBULATORY_CARE_PROVIDER_SITE_OTHER): Payer: Medicare Other | Admitting: Surgery

## 2020-01-26 ENCOUNTER — Ambulatory Visit (HOSPITAL_COMMUNITY)
Admission: RE | Admit: 2020-01-26 | Discharge: 2020-01-26 | Disposition: A | Discharge status: Home / Self Care | Payer: Medicare Other | Source: Ambulatory Visit | Attending: Surgery | Admitting: Surgery

## 2020-01-26 ENCOUNTER — Encounter: Payer: Self-pay | Admitting: Surgery

## 2020-01-26 ENCOUNTER — Other Ambulatory Visit (INDEPENDENT_AMBULATORY_CARE_PROVIDER_SITE_OTHER): Payer: Medicare Other

## 2020-01-26 VITALS — BP 189/72 | HR 61 | Temp 97.3°F | Resp 20 | Ht 67.0 in | Wt 155.0 lb

## 2020-01-26 DIAGNOSIS — D649 Anemia, unspecified: Secondary | ICD-10-CM | POA: Diagnosis not present

## 2020-01-26 DIAGNOSIS — I701 Atherosclerosis of renal artery: Secondary | ICD-10-CM | POA: Diagnosis not present

## 2020-01-26 DIAGNOSIS — I70213 Atherosclerosis of native arteries of extremities with intermittent claudication, bilateral legs: Secondary | ICD-10-CM | POA: Diagnosis not present

## 2020-01-26 DIAGNOSIS — I739 Peripheral vascular disease, unspecified: Secondary | ICD-10-CM | POA: Diagnosis not present

## 2020-01-26 LAB — CBC WITH DIFFERENTIAL/PLATELET
Basophils Absolute: 0.1 10*3/uL (ref 0.0–0.1)
Basophils Relative: 0.5 % (ref 0.0–3.0)
Eosinophils Absolute: 0.3 10*3/uL (ref 0.0–0.7)
Eosinophils Relative: 2.1 % (ref 0.0–5.0)
HCT: 42.3 % (ref 39.0–52.0)
Hemoglobin: 14.1 g/dL (ref 13.0–17.0)
Lymphocytes Relative: 22.1 % (ref 12.0–46.0)
Lymphs Abs: 3.1 10*3/uL (ref 0.7–4.0)
MCHC: 33.3 g/dL (ref 30.0–36.0)
MCV: 92.1 fl (ref 78.0–100.0)
Monocytes Absolute: 1.1 10*3/uL — ABNORMAL HIGH (ref 0.1–1.0)
Monocytes Relative: 8 % (ref 3.0–12.0)
Neutro Abs: 9.6 10*3/uL — ABNORMAL HIGH (ref 1.4–7.7)
Neutrophils Relative %: 67.3 % (ref 43.0–77.0)
Platelets: 209 10*3/uL (ref 150.0–400.0)
RBC: 4.59 Mil/uL (ref 4.22–5.81)
RDW: 16 % — ABNORMAL HIGH (ref 11.5–15.5)
WBC: 14.2 10*3/uL — ABNORMAL HIGH (ref 4.0–10.5)

## 2020-01-26 LAB — BASIC METABOLIC PANEL
BUN: 27 mg/dL — ABNORMAL HIGH (ref 6–23)
CO2: 27 mEq/L (ref 19–32)
Calcium: 9.5 mg/dL (ref 8.4–10.5)
Chloride: 102 mEq/L (ref 96–112)
Creatinine, Ser: 1.21 mg/dL (ref 0.40–1.50)
GFR: 57.57 mL/min — ABNORMAL LOW (ref >60.00)
Glucose, Bld: 106 mg/dL — ABNORMAL HIGH (ref 70–99)
Potassium: 4.3 mEq/L (ref 3.5–5.1)
Sodium: 137 mEq/L (ref 135–145)

## 2020-01-26 LAB — IRON: Iron: 120 ug/dL (ref 42–165)

## 2020-01-26 NOTE — H&P (View-Only) (Signed)
V[facility] and [facility]in Specialist of [facility]  Patient name: Ryan Pittman MRN: [id number] DOB: 1940/10/25 Sex: male   REQUESTING PROVIDER:    Dr. Gwenlyn Found    REASON FOR CONSULT:    Claudication  HISTORY OF PRESENT ILLNESS:   Ryan Pittman is a 79 y.o. male, who is referred by Dr. 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  Patient has a history of a left femoral to below-knee popliteal artery bypass graft by Dr. Lucky Cowboy in [place].  He underwent angiography in October 2020 which revealed severe distal right common femoral disease extending into the superficial femoral artery.  He is referred for endarterectomy.  He states that his activity is significantly limited by cramping in his right calf.  His claudication distance is less than 100 feet.  He denies rest pain or ulcers.  Patient is delayed his presentation secondary to being diagnosed with Covid.  He does have a significant smoking history but quit 8 years ago.  He is medically managed for hypertension.  He takes a statin for hypercholesterolemia.  Patient does suffer from coronary artery disease.  He is status post CABG in 2019. PAST MEDICAL HISTORY    Past Medical History:  Diagnosis Date  . Anemia    due to GIB, s/p transfusion  . Anginal pain ([place])   . Arthritis    back pain, much worse after consecutive golf rounds  . Cancer ([place])    thyroid  . Chronic kidney disease   . Colon polyps   . Coronary artery disease   . COVID-19   . Dyspnea    walking up a hill  . GERD (gastroesophageal reflux disease)   . Hypercalcemia    h/o, resolved as of 2012, prev due to high amount of calcium intake  . Hyperlipidemia   . Hypertension   . IgG gammopathy    stable as of 2012 per [facility]  . Pneumonia    as a child  . PVD (peripheral vascular disease) ([facility])    L leg bypass, R leg stented     FAMILY HISTORY   Family History  Problem Relation Age of Onset  . Diabetes Mother   . Stroke  Father   . Colon cancer Neg Hx   . Prostate cancer Neg Hx     SOCIAL HISTORY:   Social History   Socioeconomic History  . Marital status: Married    Spouse name: Not on file  . Number of children: Not on file  . Years of education: Not on file  . Highest education level: Not on file  Occupational History  . Not on file  Tobacco Use  . Smoking status: Former Smoker    Types: Cigarettes    Quit date: 09/04/2008    Years since quitting: 11.4  . Smokeless tobacco: Former Systems developer    Types: Chew  . Tobacco comment: HAs quit tobacco products 2009  Substance and Sexual Activity  . Alcohol use: Not Currently    Alcohol/week: 0.0 standard drinks  . Drug use: No  . Sexual activity: Yes  Other Topics Concern  . Not on file  Social History Narrative   Married 50+ years, 2 kids   [place] to play golf   Social Determinants of Health   Financial Resource Strain: Low Risk   . Difficulty of Paying Living Expenses: Not hard at all  Food Insecurity: No Food Insecurity  . Worried About Charity fundraiser in the Last Year:  Never true  . Ran Out of Food in the Last Year: Never true  Transportation Needs: No Transportation Needs  . Lack of Transportation (Medical): No  . Lack of Transportation (Non-Medical): No  Physical Activity: Insufficiently Active  . Days of Exercise per Week: 7 days  . Minutes of Exercise per Session: 10 min  Stress: No Stress Concern Present  . Feeling of Stress : Not at all  Social Connections:   . Frequency of Communication with Friends and Family:   . Frequency of Social Gatherings with Friends and Family:   . Attends Religious Services:   . Active Member of Clubs or Organizations:   . Attends Archivist Meetings:   [name] Marital Status:   Intimate Partner Violence: Not At Risk  . Fear of Current or Ex-Partner: No  . Emotionally Abused: No  . Physically Abused: No  . Sexually Abused: No    ALLERGIES:    Allergies  Allergen  Reactions  . Penicillins Itching and Other (See Comments)    PATIENT HAS HAD A PCN REACTION WITH IMMEDIATE RASH, FACIAL/TONGUE/THROAT SWELLING, SOB, OR LIGHTHEADEDNESS WITH HYPOTENSION:  #  #  YES  #  #  Has patient had a PCN reaction causing severe rash involving mucus membranes or skin necrosis: No Has patient had a PCN reaction that required hospitalization: No Has patient had a PCN reaction occurring within the last 10 years: No If all of the above answers are "NO", then may proceed with Cephalosporin use.   . Ace Inhibitors Cough  . Aspirin Other (See Comments)    H/o GI bleed  . Celebrex [Celecoxib] Other (See Comments)    GI bleed  . Lipitor [Atorvastatin] Other (See Comments)    myalgias    CURRENT MEDICATIONS:    Current Outpatient Medications  Medication Sig Dispense Refill  . acetaminophen (TYLENOL) 500 MG tablet Take 1,000 mg by mouth every 6 (six) hours as needed for mild pain.    [name] amiodarone (PACERONE) 200 MG tablet TAKE 1 TABLET BY MOUTH DAILY 90 tablet 3  . Cholecalciferol (VITAMIN D) 50 MCG (2000 UT) tablet Take 2,000 Units by mouth daily.    . clopidogrel (PLAVIX) 75 MG tablet TAKE 1 TABLET BY MOUTH DAILY 90 tablet 3  . Co-Enzyme Q-10 100 MG CAPS Take 100 mg by mouth daily.     . Evolocumab (REPATHA SURECLICK) [id number] MG/ML SOAJ Inject 140 mg into the skin every 14 (fourteen) days. 2 pen 12  . ferrous sulfate 325 (65 FE) MG tablet Take 1 tablet (325 mg total) by mouth daily with breakfast.    . fluticasone (FLOVENT HFA) 110 MCG/ACT inhaler Inhale into the lungs.    [name] levothyroxine (SYNTHROID) 75 MCG tablet 1 tablet daily except on Thursdays take 1.5 tabs. 100 tablet 1  . metoprolol tartrate (LOPRESSOR) 25 MG tablet TAKE 1/2 TABLET TWICE DAILY 30 tablet 9  . Multiple Vitamins-Minerals (MULTIVITAMIN WITH MINERALS) tablet Take 1 tablet by mouth daily. 120 tablet 2  . Omega-3 Fatty Acids (FISH OIL OMEGA-3 PO) Take 1,400 Units by mouth daily.     . pramipexole (MIRAPEX) 1  MG tablet Take 1 mg by mouth 3 (three) times daily.    . pravastatin (PRAVACHOL) 40 MG tablet TAKE ONE TABLET BY MOUTH EVERY DAY 90 tablet 3  . rOPINIRole (REQUIP) 0.25 MG tablet Take 1 tablet (0.25 mg total) by mouth at bedtime. 30 tablet 0  . tamsulosin (FLOMAX) 0.4 MG CAPS capsule Take 1 capsule (0.4 mg  total) by mouth daily. 90 capsule 3  . traZODone (DESYREL) 100 MG tablet TAKE 1/2-1 TABLET BY MOUTH AT BEDTIME ASNEEDED FOR SLEEP 30 tablet 5  . vitamin C (ASCORBIC ACID) 500 MG tablet Take 500 mg by mouth daily.    . pantoprazole (PROTONIX) 40 MG tablet Take 1 tablet (40 mg total) by mouth daily as needed (for heartburn). 30 tablet 1   No current facility-administered medications for this visit.    REVIEW OF SYSTEMS:   [X]  denotes positive finding, [ ]  denotes negative finding Cardiac  Comments:  Chest pain or chest pressure:    Shortness of breath upon exertion:    Short of breath when lying flat:    Irregular heart rhythm:        Vascular    Pain in calf, thigh, or hip brought on by ambulation:    Pain in feet at night that wakes you up from your sleep:     Blood clot in your veins:    Leg swelling:         Pulmonary    Oxygen at home:    Productive cough:     Wheezing:         Neurologic    Sudden weakness in arms or legs:     Sudden numbness in arms or legs:     Sudden onset of difficulty speaking or slurred speech:    Temporary loss of vision in one eye:     Problems with dizziness:         Gastrointestinal    Blood in stool:      Vomited blood:         Genitourinary    Burning when urinating:     Blood in urine:        Psychiatric    Major depression:         Hematologic    Bleeding problems:    Problems with blood clotting too easily:        Skin    Rashes or ulcers:        Constitutional    Fever or chills:     PHYSICAL EXAM:   Vitals:   01/26/20 0920  BP: (!) 189/72  Pulse: 61  Resp: 20  Temp: (!) 97.3 F (36.3 C)  Weight: 155 lb (70.3  kg)  Height: 5\' 7"  (1.702 m)    GENERAL: The patient is a well-nourished male, in no acute distress. The vital signs are documented above. CARDIAC: There is a regular rate and rhythm.  VASCULAR: Nonpalpable pedal pulses PULMONARY: Nonlabored respirations ABDOMEN: Soft and non-tender with normal pitched bowel sounds.  MUSCULOSKELETAL: There are no major deformities or cyanosis. NEUROLOGIC: No focal weakness or paresthesias are detected. SKIN: There are no ulcers or rashes noted. PSYCHIATRIC: The patient has a normal affect.  STUDIES:   I have reviewed his angiogram which shows severe right common femoral and proximal superficial femoral artery stenosis.  ASSESSMENT and PLAN   Right leg claudication: I discussed proceeding with right femoral endarterectomy.  This will go into the proximal superficial femoral artery as well as the origin of the profundofemoral artery.  We discussed the details of the operation including the risks and benefits.  I stressed the importance of good wound care in the postoperative period.  He would like to get this done as soon as possible.  Have him scheduled for Wednesday, June 9.  He will stop his Plavix prior to his surgery.   Rock Nephew  Ramond Craver, MD, FACS [facility] Specialists of [facility] [phone] Pager [phone]

## 2020-01-26 NOTE — Progress Notes (Signed)
[facility]  Patient name: Ryan Pittman MRN: [id number] DOB: 1940/12/09 Sex: male   REQUESTING PROVIDER:    Dr. Gwenlyn Found    REASON FOR CONSULT:    Claudication  HISTORY OF PRESENT ILLNESS:   Ryan Pittman is a 79 y.o. male, who is referred by Dr. 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  Patient has a history of a left femoral to below-knee popliteal artery bypass graft by Dr. Lucky Cowboy in [place].  He underwent angiography in October 2020 which revealed severe distal right common femoral disease extending into the superficial femoral artery.  He is referred for endarterectomy.  He states that his activity is significantly limited by cramping in his right calf.  His claudication distance is less than 100 feet.  He denies rest pain or ulcers.  Patient is delayed his presentation secondary to being diagnosed with Covid.  He does have a significant smoking history but quit 8 years ago.  He is medically managed for hypertension.  He takes a statin for hypercholesterolemia.  Patient does suffer from coronary artery disease.  He is status post CABG in 2019. PAST MEDICAL HISTORY    Past Medical History:  Diagnosis Date  . Anemia    due to GIB, s/p transfusion  . Anginal pain ([place])   . Arthritis    back pain, much worse after consecutive golf rounds  . Cancer ([place])    thyroid  . Chronic kidney disease   . Colon polyps   . Coronary artery disease   . COVID-19   . Dyspnea    walking up a hill  . GERD (gastroesophageal reflux disease)   . Hypercalcemia    h/o, resolved as of 2012, prev due to high amount of calcium intake  . Hyperlipidemia   . Hypertension   . IgG gammopathy    stable as of 2012 per [facility]  . Pneumonia    as a child  . PVD (peripheral vascular disease) ([facility])    L leg bypass, R leg stented     FAMILY HISTORY   Family History  Problem Relation Age of Onset  . Diabetes Mother   . Stroke  Father   . Colon cancer Neg Hx   . Prostate cancer Neg Hx     SOCIAL HISTORY:   Social History   Socioeconomic History  . Marital status: Married    Spouse name: Not on file  . Number of children: Not on file  . Years of education: Not on file  . Highest education level: Not on file  Occupational History  . Not on file  Tobacco Use  . Smoking status: Former Smoker    Types: Cigarettes    Quit date: 09/04/2008    Years since quitting: 11.4  . Smokeless tobacco: Former Systems developer    Types: Chew  . Tobacco comment: HAs quit tobacco products 2009  Substance and Sexual Activity  . Alcohol use: Not Currently    Alcohol/week: 0.0 standard drinks  . Drug use: No  . Sexual activity: Yes  Other Topics Concern  . Not on file  Social History Narrative   Married 50+ years, 2 kids   [place] to play golf   Social Determinants of Health   Financial Resource Strain: Low Risk   . Difficulty of Paying Living Expenses: Not hard at all  Food Insecurity: No Food Insecurity  . Worried About Charity fundraiser in the Last Year:  Never true  . Ran Out of Food in the Last Year: Never true  Transportation Needs: No Transportation Needs  . Lack of Transportation (Medical): No  . Lack of Transportation (Non-Medical): No  Physical Activity: Insufficiently Active  . Days of Exercise per Week: 7 days  . Minutes of Exercise per Session: 10 min  Stress: No Stress Concern Present  . Feeling of Stress : Not at all  Social Connections:   . Frequency of Communication with Friends and Family:   . Frequency of Social Gatherings with Friends and Family:   . Attends Religious Services:   . Active Member of Clubs or Organizations:   . Attends Archivist Meetings:   [name] Marital Status:   Intimate Partner Violence: Not At Risk  . Fear of Current or Ex-Partner: No  . Emotionally Abused: No  . Physically Abused: No  . Sexually Abused: No    ALLERGIES:    Allergies  Allergen  Reactions  . Penicillins Itching and Other (See Comments)    PATIENT HAS HAD A PCN REACTION WITH IMMEDIATE RASH, FACIAL/TONGUE/THROAT SWELLING, SOB, OR LIGHTHEADEDNESS WITH HYPOTENSION:  #  #  YES  #  #  Has patient had a PCN reaction causing severe rash involving mucus membranes or skin necrosis: No Has patient had a PCN reaction that required hospitalization: No Has patient had a PCN reaction occurring within the last 10 years: No If all of the above answers are "NO", then may proceed with Cephalosporin use.   . Ace Inhibitors Cough  . Aspirin Other (See Comments)    H/o GI bleed  . Celebrex [Celecoxib] Other (See Comments)    GI bleed  . Lipitor [Atorvastatin] Other (See Comments)    myalgias    CURRENT MEDICATIONS:    Current Outpatient Medications  Medication Sig Dispense Refill  . acetaminophen (TYLENOL) 500 MG tablet Take 1,000 mg by mouth every 6 (six) hours as needed for mild pain.    [name] amiodarone (PACERONE) 200 MG tablet TAKE 1 TABLET BY MOUTH DAILY 90 tablet 3  . Cholecalciferol (VITAMIN D) 50 MCG (2000 UT) tablet Take 2,000 Units by mouth daily.    . clopidogrel (PLAVIX) 75 MG tablet TAKE 1 TABLET BY MOUTH DAILY 90 tablet 3  . Co-Enzyme Q-10 100 MG CAPS Take 100 mg by mouth daily.     . Evolocumab (REPATHA SURECLICK) [id number] MG/ML SOAJ Inject 140 mg into the skin every 14 (fourteen) days. 2 pen 12  . ferrous sulfate 325 (65 FE) MG tablet Take 1 tablet (325 mg total) by mouth daily with breakfast.    . fluticasone (FLOVENT HFA) 110 MCG/ACT inhaler Inhale into the lungs.    [name] levothyroxine (SYNTHROID) 75 MCG tablet 1 tablet daily except on Thursdays take 1.5 tabs. 100 tablet 1  . metoprolol tartrate (LOPRESSOR) 25 MG tablet TAKE 1/2 TABLET TWICE DAILY 30 tablet 9  . Multiple Vitamins-Minerals (MULTIVITAMIN WITH MINERALS) tablet Take 1 tablet by mouth daily. 120 tablet 2  . Omega-3 Fatty Acids (FISH OIL OMEGA-3 PO) Take 1,400 Units by mouth daily.     . pramipexole (MIRAPEX) 1  MG tablet Take 1 mg by mouth 3 (three) times daily.    . pravastatin (PRAVACHOL) 40 MG tablet TAKE ONE TABLET BY MOUTH EVERY DAY 90 tablet 3  . rOPINIRole (REQUIP) 0.25 MG tablet Take 1 tablet (0.25 mg total) by mouth at bedtime. 30 tablet 0  . tamsulosin (FLOMAX) 0.4 MG CAPS capsule Take 1 capsule (0.4 mg  total) by mouth daily. 90 capsule 3  . traZODone (DESYREL) 100 MG tablet TAKE 1/2-1 TABLET BY MOUTH AT BEDTIME ASNEEDED FOR SLEEP 30 tablet 5  . vitamin C (ASCORBIC ACID) 500 MG tablet Take 500 mg by mouth daily.    . pantoprazole (PROTONIX) 40 MG tablet Take 1 tablet (40 mg total) by mouth daily as needed (for heartburn). 30 tablet 1   No current facility-administered medications for this visit.    REVIEW OF SYSTEMS:   [X]  denotes positive finding, [ ]  denotes negative finding Cardiac  Comments:  Chest pain or chest pressure:    Shortness of breath upon exertion:    Short of breath when lying flat:    Irregular heart rhythm:        Vascular    Pain in calf, thigh, or hip brought on by ambulation:    Pain in feet at night that wakes you up from your sleep:     Blood clot in your veins:    Leg swelling:         Pulmonary    Oxygen at home:    Productive cough:     Wheezing:         Neurologic    Sudden weakness in arms or legs:     Sudden numbness in arms or legs:     Sudden onset of difficulty speaking or slurred speech:    Temporary loss of vision in one eye:     Problems with dizziness:         Gastrointestinal    Blood in stool:      Vomited blood:         Genitourinary    Burning when urinating:     Blood in urine:        Psychiatric    Major depression:         Hematologic    Bleeding problems:    Problems with blood clotting too easily:        Skin    Rashes or ulcers:        Constitutional    Fever or chills:     PHYSICAL EXAM:   Vitals:   01/26/20 0920  BP: (!) 189/72  Pulse: 61  Resp: 20  Temp: (!) 97.3 F (36.3 C)  Weight: 155 lb (70.3  kg)  Height: 5\' 7"  (1.702 m)    GENERAL: The patient is a well-nourished male, in no acute distress. The vital signs are documented above. CARDIAC: There is a regular rate and rhythm.  VASCULAR: Nonpalpable pedal pulses PULMONARY: Nonlabored respirations ABDOMEN: Soft and non-tender with normal pitched bowel sounds.  MUSCULOSKELETAL: There are no major deformities or cyanosis. NEUROLOGIC: No focal weakness or paresthesias are detected. SKIN: There are no ulcers or rashes noted. PSYCHIATRIC: The patient has a normal affect.  STUDIES:   I have reviewed his angiogram which shows severe right common femoral and proximal superficial femoral artery stenosis.  ASSESSMENT and PLAN   Right leg claudication: I discussed proceeding with right femoral endarterectomy.  This will go into the proximal superficial femoral artery as well as the origin of the profundofemoral artery.  We discussed the details of the operation including the risks and benefits.  I stressed the importance of good wound care in the postoperative period.  He would like to get this done as soon as possible.  Have him scheduled for Wednesday, June 9.  He will stop his Plavix prior to his surgery.   Rock Nephew  Ramond Craver, MD, FACS [facility] [phone] Pager [phone]

## 2020-01-27 ENCOUNTER — Other Ambulatory Visit: Payer: Medicare Other

## 2020-01-29 ENCOUNTER — Inpatient Hospital Stay (HOSPITAL_COMMUNITY): Admission: RE | Admit: 2020-01-29 | Payer: Medicare Other | Source: Ambulatory Visit

## 2020-01-30 NOTE — Pre-Procedure Instructions (Signed)
Ryan Pittman  01/30/2020      [facility] - [place], [place] [address] Phone: [phone] Fax: [phone]  [place], [place] - [address] [place] [address] Phone: [phone] Fax: [phone]    Your procedure is scheduled on June 9  Report to [facility] Entrance A at 10:55 A.M.  Call this number if you have problems the morning of surgery:  [phone]   Remember:  Do not eat or drink after midnight.      Take these medicines the morning of surgery with A SIP OF WATER :              Tylenol if needed             Amiodarone (pacerone)             Amlodipine (norvasc)             Levothyroxine (synthroid)             Metoprolol tartrate (lopressor)             Pantoprazole (protonix)             Pravastatin (pravachol)             tamsulosin (requip)  Pramipexole (mirapex)         7 days prior to surgery STOP taking any Aspirin (unless otherwise instructed by your surgeon), Aleve, Naproxen, Ibuprofen, Motrin, Advil, Goody's, BC's, all herbal medications, fish oil, and all vitamins.  Do not wear jewelry.  Do not wear lotions, powders, or perfumes, or deodorant.  Do not shave 48 hours prior to surgery.  Men may shave face and neck.  Do not bring valuables to the hospital.  [facility] is not responsible for any belongings or valuables.  Contacts, dentures or bridgework may not be worn into surgery.  Leave your suitcase in the car.  After surgery it may be brought to your room.  For patients admitted to the hospital, discharge time will be determined by your treatment team.  Patients discharged the day of surgery will not be allowed to drive home.    Special instructions:   [facility]- Preparing For Surgery  Before surgery, you can play an important role. Because skin is not sterile, your skin needs to be as free of germs as possible. You can reduce the number  of germs on your skin by washing with CHG (chlorahexidine gluconate) Soap before surgery.  CHG is an antiseptic cleaner which kills germs and bonds with the skin to continue killing germs even after washing.    Oral Hygiene is also important to reduce your risk of infection.  Remember - BRUSH YOUR TEETH THE MORNING OF SURGERY WITH YOUR REGULAR TOOTHPASTE  Please do not use if you have an allergy to CHG or antibacterial soaps. If your skin becomes reddened/irritated stop using the CHG.  Do not shave (including legs and underarms) for at least 48 hours prior to first CHG shower. It is OK to shave your face.  Please follow these instructions carefully.   1. Shower the NIGHT BEFORE SURGERY and the MORNING OF SURGERY with CHG.   2. If you chose to wash your hair, wash your hair first as usual with your normal shampoo.  3. After you shampoo, rinse your hair and body thoroughly to remove the shampoo.  4. Use CHG as  you would any other liquid soap. You can apply CHG directly to the skin and wash gently with a scrungie or a clean washcloth.   5. Apply the CHG Soap to your body ONLY FROM THE NECK DOWN.  Do not use on open wounds or open sores. Avoid contact with your eyes, ears, mouth and genitals (private parts). Wash Face and genitals (private parts)  with your normal soap.  6. Wash thoroughly, paying special attention to the area where your surgery will be performed.  7. Thoroughly rinse your body with warm water from the neck down.  8. DO NOT shower/wash with your normal soap after using and rinsing off the CHG Soap.  9. Pat yourself dry with a CLEAN TOWEL.  10. Wear CLEAN PAJAMAS to bed the night before surgery, wear comfortable clothes the morning of surgery  11. Place CLEAN SHEETS on your bed the night of your first shower and DO NOT SLEEP WITH PETS.    Day of Surgery:  Do not apply any deodorants/lotions.  Please wear clean clothes to the hospital/surgery center.   Remember to  brush your teeth WITH YOUR REGULAR TOOTHPASTE.    Please read over the following fact sheets that you were given. Coughing and Deep Breathing and Surgical Site Infection Prevention

## 2020-02-02 ENCOUNTER — Other Ambulatory Visit: Payer: Self-pay | Admitting: Family Medicine

## 2020-02-02 DIAGNOSIS — D649 Anemia, unspecified: Secondary | ICD-10-CM

## 2020-02-02 DIAGNOSIS — Z862 Personal history of diseases of the blood and blood-forming organs and certain disorders involving the immune mechanism: Secondary | ICD-10-CM

## 2020-02-03 ENCOUNTER — Other Ambulatory Visit: Payer: Self-pay

## 2020-02-03 ENCOUNTER — Encounter (HOSPITAL_COMMUNITY)
Admission: RE | Admit: 2020-02-03 | Discharge: 2020-02-03 | Disposition: A | Discharge status: Home / Self Care | Payer: Medicare Other | Source: Ambulatory Visit | Attending: Surgery | Admitting: Surgery

## 2020-02-03 ENCOUNTER — Encounter (HOSPITAL_COMMUNITY): Payer: Self-pay

## 2020-02-03 DIAGNOSIS — Z7902 Long term (current) use of antithrombotics/antiplatelets: Secondary | ICD-10-CM | POA: Insufficient documentation

## 2020-02-03 DIAGNOSIS — I2510 Atherosclerotic heart disease of native coronary artery without angina pectoris: Secondary | ICD-10-CM | POA: Diagnosis not present

## 2020-02-03 DIAGNOSIS — K219 Gastro-esophageal reflux disease without esophagitis: Secondary | ICD-10-CM | POA: Diagnosis not present

## 2020-02-03 DIAGNOSIS — Z79899 Other long term (current) drug therapy: Secondary | ICD-10-CM | POA: Insufficient documentation

## 2020-02-03 DIAGNOSIS — I129 Hypertensive chronic kidney disease with stage 1 through stage 4 chronic kidney disease, or unspecified chronic kidney disease: Secondary | ICD-10-CM | POA: Insufficient documentation

## 2020-02-03 DIAGNOSIS — I739 Peripheral vascular disease, unspecified: Secondary | ICD-10-CM | POA: Diagnosis not present

## 2020-02-03 DIAGNOSIS — N189 Chronic kidney disease, unspecified: Secondary | ICD-10-CM | POA: Insufficient documentation

## 2020-02-03 DIAGNOSIS — Z95820 Peripheral vascular angioplasty status with implants and grafts: Secondary | ICD-10-CM | POA: Diagnosis not present

## 2020-02-03 DIAGNOSIS — Z8616 Personal history of COVID-19: Secondary | ICD-10-CM | POA: Insufficient documentation

## 2020-02-03 DIAGNOSIS — Z951 Presence of aortocoronary bypass graft: Secondary | ICD-10-CM | POA: Insufficient documentation

## 2020-02-03 DIAGNOSIS — Z79890 Hormone replacement therapy (postmenopausal): Secondary | ICD-10-CM | POA: Insufficient documentation

## 2020-02-03 DIAGNOSIS — E785 Hyperlipidemia, unspecified: Secondary | ICD-10-CM | POA: Diagnosis not present

## 2020-02-03 DIAGNOSIS — Z01812 Encounter for preprocedural laboratory examination: Secondary | ICD-10-CM | POA: Insufficient documentation

## 2020-02-03 DIAGNOSIS — E890 Postprocedural hypothyroidism: Secondary | ICD-10-CM | POA: Diagnosis not present

## 2020-02-03 DIAGNOSIS — Z87891 Personal history of nicotine dependence: Secondary | ICD-10-CM | POA: Diagnosis not present

## 2020-02-03 DIAGNOSIS — Z85850 Personal history of malignant neoplasm of thyroid: Secondary | ICD-10-CM | POA: Diagnosis not present

## 2020-02-03 HISTORY — DX: Hypothyroidism, unspecified: E03.9

## 2020-02-03 LAB — COMPREHENSIVE METABOLIC PANEL
ALT: 16 U/L (ref 0–44)
AST: 20 U/L (ref 15–41)
Albumin: 3.8 g/dL (ref 3.5–5.0)
Alkaline Phosphatase: 39 U/L (ref 38–126)
Anion gap: 8 (ref 5–15)
BUN: 21 mg/dL (ref 8–23)
CO2: 26 mmol/L (ref 22–32)
Calcium: 9.1 mg/dL (ref 8.9–10.3)
Chloride: 108 mmol/L (ref 98–111)
Creatinine, Ser: 1.11 mg/dL (ref 0.61–1.24)
GFR calc Af Amer: >60 mL/min (ref >60)
GFR calc non Af Amer: >60 mL/min (ref >60)
Glucose, Bld: 105 mg/dL — ABNORMAL HIGH (ref 70–99)
Potassium: 3.8 mmol/L (ref 3.5–5.1)
Sodium: 142 mmol/L (ref 135–145)
Total Bilirubin: 0.6 mg/dL (ref 0.3–1.2)
Total Protein: 6.4 g/dL — ABNORMAL LOW (ref 6.5–8.1)

## 2020-02-03 LAB — CBC
HCT: 44.9 % (ref 39.0–52.0)
Hemoglobin: 14.7 g/dL (ref 13.0–17.0)
MCH: 30.4 pg (ref 26.0–34.0)
MCHC: 32.7 g/dL (ref 30.0–36.0)
MCV: 93 fL (ref 80.0–100.0)
Platelets: 197 10*3/uL (ref 150–400)
RBC: 4.83 MIL/uL (ref 4.22–5.81)
RDW: 15.3 % (ref 11.5–15.5)
WBC: 14.9 10*3/uL — ABNORMAL HIGH (ref 4.0–10.5)
nRBC: 0 % (ref 0.0–0.2)

## 2020-02-03 LAB — URINALYSIS, ROUTINE W REFLEX MICROSCOPIC
Bacteria, UA: NONE SEEN
Bilirubin Urine: NEGATIVE
Glucose, UA: NEGATIVE mg/dL
Hgb urine dipstick: NEGATIVE
Ketones, ur: NEGATIVE mg/dL
Leukocytes,Ua: NEGATIVE
Nitrite: NEGATIVE
Protein, ur: 100 mg/dL — AB
Specific Gravity, Urine: 1.01 (ref 1.005–1.030)
pH: 6 (ref 5.0–8.0)

## 2020-02-03 LAB — SURGICAL PCR SCREEN
MRSA, PCR: POSITIVE — AB
Staphylococcus aureus: POSITIVE — AB

## 2020-02-03 LAB — PROTIME-INR
INR: 0.9 (ref 0.8–1.2)
Prothrombin Time: 12 seconds (ref 11.4–15.2)

## 2020-02-03 LAB — APTT: aPTT: 32 seconds (ref 24–36)

## 2020-02-03 NOTE — Progress Notes (Signed)
PCP - Dr. Beverlee Nims [name]  Cardiologist - Dr. [name]   Chest x-ray - Not needed EKG - 08/11/19 Stress Test -04/18/18 ECHO - 08/12/19 Cardiac Cath - 06/24/18  Sleep Study - No CPAP - Denies  DM - Denies  Blood Thinner Instructions:Plavix last dose on June 3rd  COVID TEST- June 8th  Anesthesia review: Yes H/O HTN  Patient denies shortness of breath, fever, cough and chest pain at PAT appointment   All instructions explained to the patient, with a verbal understanding of the material. Patient agrees to go over the instructions while at home for a better understanding. Patient also instructed to self quarantine after being tested for COVID-19. The opportunity to ask questions was provided.

## 2020-02-04 ENCOUNTER — Encounter (HOSPITAL_COMMUNITY): Payer: Self-pay

## 2020-02-04 ENCOUNTER — Telehealth: Payer: Self-pay | Admitting: *Deleted

## 2020-02-04 LAB — TYPE AND SCREEN
ABO/RH(D): A NEG
Antibody Screen: NEGATIVE

## 2020-02-04 NOTE — Telephone Encounter (Signed)
Patient's wife called stating that her husband is scheduled next week for surgery on his veins. Mrs. Zombek stated that she has questions about his lab work that was done yesterday and wants Dr. Josefine Class input on his results. Patient's wife stated that he has concerns about being put to sleep. Advised Mrs. Krolikowski that Dr. Damita Dunnings is out of the office today and they should really talk with his surgeon about his concerns. Advised Mrs. Viglione that the doctor that ordered the lab work should reach out to them go over his lab results with him. Mrs. Frosch stated that she will reach out to the surgeon to discuss their concerns and get them to explain the lab results.

## 2020-02-04 NOTE — Progress Notes (Addendum)
Anesthesia Chart Review:  Case: [id number] Date/Time: 02/13/20 1100   Procedure: RIGHT ENDARTERECTOMY FEMORAL WITH VEIN PATCH (Right )   Anesthesia type: General   Pre-op diagnosis: PERIPHERAL ARTERY DISEASE   Location: [facility] OR ROOM 12 / [place] [facility]   Surgeons: Serafina Mitchell, MD      DISCUSSION: Patient is an 79 year old male scheduled for the above procedure. He was referred to vascular surgery by cardiologist Dr. Gwenlyn Found.  Surgery was initially scheduled for 02/11/20, but moved to 02/13/20 due to scheduling conflict.   History includes former smoker (quit 09/04/08), CAD (CABG 07/11/18: LIMA-LAD, SVG-OM1-OM2-OM3, SVG-dRCA; had post-op PAF), HTN, renal artery stenosis, HLD, CKD, PVD (right SFA stent, left FPBG ~ 2009), IgG-kappa MGUS, anemia (due to GI bleed, s/p PRBC), hypercalcemia (2012), exertional dyspnea, GERD, thyroid cancer (s/p left thyroidectomy 10/30/14; post-surgical hypothyroidism), dental implant (left, bottom), COVD-19 06/30/19 (with hospitalizations 07/09/19-07/20/19 and 08/11/19-08/28/19 for pneumonia/hypoxia, required home O2 2-3L at discharge, but discontinued 09/17/19).  Patient recently seen by cardiology on 12/09/19. Last pulmonology visit 12/16/19.   Patient's wife notified pulmonologist Dr. Teodoro Kil staff about surgery plans. Of note, appears Comptroller had indicated to wife that they thought surgery would be done under "conscious sedation"; however, I called and spoke with Mr. Pullum and told him that procedure is posted for general anesthesia and to anticipate GETA. He reported that his understanding had always been that surgery would be done under general anesthesia. He says his wife tends to get anxious, so I spoke with his wife as well--She primarily wants everyone to be aware about his history. He says that he feels back to his baseline from a pulmonary standpoint. He said breathing was actually worse prior to his CABG, but was exacerbation following COVID, but only required home  oxygen for a few weeks. He denied SOB at rest, and says chronic DOE is stable with activities such as fast past walking (again, feels back to baseline--pre-COVID). 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    Per [facility], patient to hold Plavix for 5 days prior to surgery. Last Plavix 02/07/20 (since surgery moved from 02/11/20/ to 02/13/20). Preoperative COVID-19 test is scheduled for 02/10/20. Discussed case with anesthesiologist Annye Asa, MD. Patient had recent cardiology and pulmonology evaluations (~ 2 months ago). He says he feels at his baseline. He is no longer requiring inhalers. Anesthesia team to evaluate on the day of surgery.    VS: BP (!) 184/64   Pulse (!) 57   Temp 36.9 C (Oral)   Resp 20   Ht 5\' 7"  (1.702 m)   Wt 71.8 kg   SpO2 99%   BMI 24.78 kg/m     PROVIDERS: Tonia Ghent, MD is PCP  - [name], MD is cardiologist. Last evaluation 12/09/19 by Coletta Memos, NP. Ottie Glazier, MD is pulmonologist Jefm Bryant, see [place]). Established 09/2019 for post-hospital COVID-19 pneumonitis. Last visit 12/16/19. - HEM[facility]ONC is through [facility] (see [place]). Last evaluation 10/16/19 with Almon Hercules, PA-C. MGUS clinically stable, and 12 month follow-up recommended.  Davene Costain, MD is endocrinologist ([place]). Last evaluation 06/16/19.  Holley Raring, [place], MD is nephrologist ([place]). Last visit 12/18/19. Renal function improving post-COVID and anemia stable. 4 month follow-up planned.   LABS: Preoperative labs noted. WBC 14.9, previously 14.2 on 01/26/20 and 9.7 on 11/19/19--history of MGUS followed by [facility]. UA negative for leukocytes and nitrites. TSH and free T4  WNL on 12/29/19 ([facility]). A1c 6.1% on 07/14/19. Nasal PCR + for Staph aureus and MRSA--no known allergy to betadine.  (all labs ordered are listed, but only abnormal results are displayed)  Labs Reviewed  SURGICAL  PCR SCREEN - Abnormal; Notable for the following components:      Result Value   MRSA, PCR POSITIVE (*)    Staphylococcus aureus POSITIVE (*)    All other components within normal limits  CBC - Abnormal; Notable for the following components:   WBC 14.9 (*)    All other components within normal limits  COMPREHENSIVE METABOLIC PANEL - Abnormal; Notable for the following components:   Glucose, Bld 105 (*)    Total Protein 6.4 (*)    All other components within normal limits  URINALYSIS, ROUTINE W REFLEX MICROSCOPIC - Abnormal; Notable for the following components:   Color, Urine STRAW (*)    Protein, ur 100 (*)    All other components within normal limits  APTT  PROTIME-INR  TYPE AND SCREEN    OTHER: Spirometry 10/01/19 ([facility]): FVC was 3.10 liters, 109% of predicted FEV1 was 2.29, 105% of predicted FEV1 ratio was 74 FEF 25-75% liters per second was 80% of predicted  LUNG VOLUMES: TLC was 93% of predicted RV was 76% of predicted  DIFFUSION CAPACITY: DLCO was 63% of predicted DLCO/VA was 66% of predicted *Patient states he has History of Anemia Most Recent Hemoglobin I could find was @ Hospital, 12/23 9.5, Corrected to 77%  FLOW VOLUME LOOP: Normal FVL shape  Impression Preserved spirometry and lung volumes with decreased DLCO at 63%   Walk test 09/17/19 ([facility]): " He had exertional walk done today - we walked around hallway on level grade , he did not use oxygen during 6 minute walk and was found to have no desaturation with lowest O2 at 94% and reported BORG2-3 dyspnea at end of walk. He no longer will require O2 therapy."       IMAGES: MRI L-spine 01/12/20: IMPRESSION: 1. 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 2. Moderate neural foraminal stenosis at left L1-2, right L2-3, left L4-5 and left L5-S1.  1V PCXR 08/15/19 (in the setting of COVID PNA): FINDINGS: Post median  sternotomy. Heart is normal in size. Unchanged appearance of the chest with diffuse fine reticular opacities in both lungs. No pleural fluid or pneumothorax. IMPRESSION: Unchanged appearance of the chest with diffuse fine reticular opacities in both lungs. This may represent pneumonia, pulmonary edema, or combination thereof.   EKG: 08/11/19:  Sinus rhythm Borderline low voltage, extremity leads Confirmed by UNCONFIRMED, DOCTOR ([id number]), editor Mel Almond, Tammy [phone]) on 08/11/2019 11:44:45 AM   CV: Renal US 10/14/19: Summary:  Renal:  Right: 1-59% stenosis of the right renal artery. RRV flow present.     Normal size right kidney. Abnormal right Resistive Index.     Normal cortical thickness of right kidney.  Left: Evidence of an essentially stable > 60% stenosis in the left     renal artery. LRV flow present. Normal size of left kidney.     Abnormal left Resisitve Index. Normal cortical thickness of     the left kidney.  Mesenteric:  Normal Celiac artery findings. 70 to 99% stenosis in the superior  mesenteric  artery. Areas of limited visceral study include mesenteric arteries, dense  overlying bowel gas in the proximal aorta area.  - Reviewed by Dr. Gwenlyn Found on 10/14/19, "  No change from prior study. Repeat in 12 months."   Echo 08/12/19: IMPRESSIONS  1. Left ventricular ejection fraction, by visual estimation, is 55 to  60%. The left ventricle has normal function. There is moderately increased  left ventricular hypertrophy.  2. Elevated left atrial pressure.  3. Left ventricular diastolic parameters are consistent with Grade I  diastolic dysfunction (impaired relaxation).  4. The left ventricle has no regional wall motion abnormalities.  5. Global right ventricle has normal systolic function.The right  ventricular size is mildly enlarged. No increase in right ventricular wall  thickness.  6. Left atrial size was mildly dilated.  7. Right atrial size  was mildly dilated.  8. The pericardium was not well visualized.  9. The mitral valve is degenerative. Mild mitral valve regurgitation.  10. The tricuspid valve is normal in structure. Tricuspid valve  regurgitation is trivial.  11. The aortic valve is tricuspid. Aortic valve regurgitation is not  visualized. 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  12. The pulmonic valve was not well visualized. Pulmonic valve  regurgitation is not visualized.  13. The interatrial septum was not well visualized.    Abdominal Aortogram/BLE runoff 06/26/19 (Dr. Gwenlyn Found): Angiographic Data:  1: Abdominal aortogram-there was a 90% proximal left renal artery stenosis.  The infrarenal abdominal aorta was mildly atherosclerotic 2: Left lower extremity-there was a nonflow limiting ulcerated plaque in the left external iliac artery.  The left femoropopliteal bypass graft was patent with good proximal distal anastomoses.  There was two-vessel runoff with occluded occluded posterior tibial and a 90% segmental proximal anterior tibial artery. 3: Right lower extremity-the right common iliac stent was widely patent.  There was a 95% calcified distal common femoral, ostial profunda and SFA stenosis.  There was a 60% stenosis in the SFA just beyond this.  The previously placed 12 self-expanding stent in the mid right SFA was patent with 80% fairly focal in-stent restenosis.  There is 90% calcified stenosis in the popliteal artery with three-vessel runoff.  There was segmental 90% anterior tib and posterior tibial stenoses. IMPRESSION: Mr. Feehan has a high-grade calcified distal right common femoral ostial SFA and profunda stenosis not safely amenable to endovascular treatment.  He will need endarterectomy and patch angioplasty.    Last cardiac cath was on 06/24/18, pre-CABG and showed 3V CAD with LVEF 55-65%.   Carotid US 02/13/18: IMPRESSION: - Mild bilateral  carotid atherosclerosis. No hemodynamically significant ICA stenosis. Degree of narrowing less than 50% bilaterally by ultrasound criteria. - Patent antegrade vertebral flow bilaterally   Past Medical History:  Diagnosis Date  . Anemia    due to GIB, s/p transfusion  . Anginal pain ([place])   . Arthritis    back pain, much worse after consecutive golf rounds  . Cancer ([place])    thyroid  . Chronic kidney disease   . Colon polyps   . Coronary artery disease   . COVID-19   . Dyspnea    walking up a hill  . Dysrhythmia 2020  . GERD (gastroesophageal reflux disease)   . Hypercalcemia    h/o, resolved as of 2012, prev due to high amount of calcium intake  . Hyperlipidemia   . Hypertension   . Hypothyroidism   . IgG gammopathy    stable as of 2012 per [facility]  . Pneumonia    as a child  . PVD (peripheral vascular disease) (H[facility])    L leg bypass, R leg stented  Past Surgical History:  Procedure Laterality Date  .  dental implant left bottom-did have bleeding     . ABDOMINAL AORTOGRAM W/LOWER EXTREMITY Bilateral 06/26/2019   Procedure: ABDOMINAL AORTOGRAM W/LOWER EXTREMITY;  Surgeon: [name], MD;  Location: [place] CV LAB;  Service: Cardiovascular;  Laterality: Bilateral;  . BYPASS GRAFT     L leg  . CARDIAC CATHETERIZATION  06/24/2018  . COLONOSCOPY WITH PROPOFOL N/A 05/04/2015   Procedure: COLONOSCOPY WITH PROPOFOL;  Surgeon: Lollie Sails, MD;  Location: [facility] ENDOSCOPY;  Service: Endoscopy;  Laterality: N/A;  . CORONARY ARTERY BYPASS GRAFT N/A 07/11/2018   Procedure: CORONARY ARTERY BYPASS GRAFTING (CABG) x three, using left internal mammary artery and right leg greater saphenous vein harvested endoscopically;  Surgeon: Gaye Pollack, MD;  Location: [place] OR;  Service: Open Heart Surgery;  Laterality: N/A;  . LEFT HEART CATH AND CORONARY ANGIOGRAPHY N/A 06/24/2018   Procedure: LEFT HEART CATH AND CORONARY ANGIOGRAPHY;  Surgeon: [name], MD;  Location: [place] CV LAB;  Service: Cardiovascular;  Laterality: N/A;  . TEE WITHOUT CARDIOVERSION N/A 07/11/2018   Procedure: TRANSESOPHAGEAL ECHOCARDIOGRAM (TEE);  Surgeon: Gaye Pollack, MD;  Location: [place];  Service: Open Heart Surgery;  Laterality: N/A;  . THYROIDECTOMY, PARTIAL  2016  . VASCULAR SURGERY      MEDICATIONS: . acetaminophen (TYLENOL) 500 MG tablet  . amiodarone (PACERONE) 200 MG tablet  . amLODipine (NORVASC) 2.5 MG tablet  . clopidogrel (PLAVIX) 75 MG tablet  . Co-Enzyme Q-10 100 MG CAPS  . Evolocumab (REPATHA SURECLICK) [id number] MG/ML SOAJ  . levothyroxine (SYNTHROID) 75 MCG tablet  . losartan (COZAAR) 50 MG tablet  . metoprolol tartrate (LOPRESSOR) 25 MG tablet  . Multiple Vitamins-Minerals (MULTIVITAMIN WITH MINERALS) tablet  . Omega-3 Fatty Acids (FISH OIL OMEGA-3 PO)  . pantoprazole (PROTONIX) 40 MG tablet  . pramipexole (MIRAPEX) 1 MG tablet  . pravastatin (PRAVACHOL) 40 MG tablet  . rOPINIRole (REQUIP) 0.25 MG tablet  . tamsulosin (FLOMAX) 0.4 MG CAPS capsule  . traZODone (DESYREL) 100 MG tablet  . vitamin C (ASCORBIC ACID) 500 MG tablet   No current facility-administered medications for this encounter.    Myra Gianotti, PA-C Surgical Short Stay/Anesthesiology [facility] Phone [phone] [facility] Phone [phone] 02/09/2020 4:28 PM

## 2020-02-04 NOTE — Telephone Encounter (Signed)
Agreed.  Thanks.  

## 2020-02-09 NOTE — Anesthesia Preprocedure Evaluation (Addendum)
Anesthesia Evaluation  Patient identified by MRN, date of birth, ID band Patient awake    Airway Mallampati: II  TM Distance: >3 FB     Dental   Pulmonary former smoker,    breath sounds clear to auscultation       Cardiovascular hypertension, + angina + CAD and + Peripheral Vascular Disease  + dysrhythmias  Rhythm:Regular Rate:Normal     Neuro/Psych    GI/Hepatic PUD, GERD  ,  Endo/Other  Hypothyroidism   Renal/GU Renal disease     Musculoskeletal   Abdominal   Peds  Hematology  (+) anemia ,   Anesthesia Other Findings   Reproductive/Obstetrics                            Anesthesia Physical Anesthesia Plan  ASA: III  Anesthesia Plan: General   Post-op Pain Management:    Induction: Intravenous  PONV Risk Score and Plan: 2 and Ondansetron and Midazolam  Airway Management Planned: Oral ETT  Additional Equipment:   Intra-op Plan:   Post-operative Plan: Extubation in OR  Informed Consent: I have reviewed the patients History and Physical, chart, labs and discussed the procedure including the risks, benefits and alternatives for the proposed anesthesia with the patient or authorized representative who has indicated his/her understanding and acceptance.     Dental advisory given  Plan Discussed with: CRNA and Anesthesiologist  Anesthesia Plan Comments: (See PAT note written by Myra Gianotti, PA-C. History includes former smoker (quit 09/04/08), CAD (CABG 07/11/18; post-op PAF), HTN, renal artery stenosis, HLD, PVD (right SFA stent, left FPBG ~ 2009), MGUS, GERD, thyroid cancer (s/p left thyroidectomy 10/30/14; post-surgical hypothyroidism), dental implant (left, bottom), COVD-19 06/30/19 (with hospitalization x2, post-COVID pneumonitis, required short term O2, discontinued 09/17/19). Evaluated by cardiology and pulmonology in April 2020. Last Plavix 02/07/20. )      Anesthesia Quick  Evaluation

## 2020-02-10 ENCOUNTER — Other Ambulatory Visit: Payer: Self-pay

## 2020-02-10 ENCOUNTER — Other Ambulatory Visit
Admission: RE | Admit: 2020-02-10 | Discharge: 2020-02-10 | Disposition: A | Discharge status: Home / Self Care | Payer: Medicare Other | Source: Ambulatory Visit | Attending: Surgery | Admitting: Surgery

## 2020-02-10 DIAGNOSIS — Z20822 Contact with and (suspected) exposure to covid-19: Secondary | ICD-10-CM | POA: Diagnosis not present

## 2020-02-10 DIAGNOSIS — Z01812 Encounter for preprocedural laboratory examination: Secondary | ICD-10-CM | POA: Insufficient documentation

## 2020-02-10 LAB — SARS CORONAVIRUS 2 (TAT 6-24 HRS): SARS Coronavirus 2: NEGATIVE

## 2020-02-13 ENCOUNTER — Encounter (HOSPITAL_COMMUNITY): Payer: Self-pay | Admitting: Surgery

## 2020-02-13 ENCOUNTER — Encounter (HOSPITAL_COMMUNITY)
Admission: RE | Disposition: A | Discharge status: Home / Self Care | Payer: Self-pay | Source: Home / Self Care | Attending: Surgery

## 2020-02-13 ENCOUNTER — Inpatient Hospital Stay (HOSPITAL_COMMUNITY): Payer: Medicare Other | Admitting: Certified Registered Nurse Anesthetist

## 2020-02-13 ENCOUNTER — Inpatient Hospital Stay (HOSPITAL_COMMUNITY): Payer: Medicare Other | Admitting: Vascular Surgery

## 2020-02-13 ENCOUNTER — Other Ambulatory Visit: Payer: Self-pay

## 2020-02-13 ENCOUNTER — Inpatient Hospital Stay (HOSPITAL_COMMUNITY)
Admission: RE | Admit: 2020-02-13 | Discharge: 2020-02-15 | DRG: 272 | Disposition: A | Discharge status: Home / Self Care | Payer: Medicare Other | Attending: Surgery | Admitting: Surgery

## 2020-02-13 DIAGNOSIS — I129 Hypertensive chronic kidney disease with stage 1 through stage 4 chronic kidney disease, or unspecified chronic kidney disease: Secondary | ICD-10-CM | POA: Diagnosis not present

## 2020-02-13 DIAGNOSIS — I2510 Atherosclerotic heart disease of native coronary artery without angina pectoris: Secondary | ICD-10-CM | POA: Diagnosis present

## 2020-02-13 DIAGNOSIS — I739 Peripheral vascular disease, unspecified: Secondary | ICD-10-CM | POA: Diagnosis not present

## 2020-02-13 DIAGNOSIS — Z87891 Personal history of nicotine dependence: Secondary | ICD-10-CM | POA: Diagnosis not present

## 2020-02-13 DIAGNOSIS — M1990 Unspecified osteoarthritis, unspecified site: Secondary | ICD-10-CM | POA: Diagnosis present

## 2020-02-13 DIAGNOSIS — Z8616 Personal history of COVID-19: Secondary | ICD-10-CM | POA: Diagnosis not present

## 2020-02-13 DIAGNOSIS — E785 Hyperlipidemia, unspecified: Secondary | ICD-10-CM | POA: Diagnosis present

## 2020-02-13 DIAGNOSIS — Z823 Family history of stroke: Secondary | ICD-10-CM

## 2020-02-13 DIAGNOSIS — D472 Monoclonal gammopathy: Secondary | ICD-10-CM | POA: Diagnosis present

## 2020-02-13 DIAGNOSIS — I480 Paroxysmal atrial fibrillation: Secondary | ICD-10-CM | POA: Diagnosis not present

## 2020-02-13 DIAGNOSIS — N189 Chronic kidney disease, unspecified: Secondary | ICD-10-CM | POA: Diagnosis present

## 2020-02-13 DIAGNOSIS — Z79890 Hormone replacement therapy (postmenopausal): Secondary | ICD-10-CM

## 2020-02-13 DIAGNOSIS — N183 Chronic kidney disease, stage 3 unspecified: Secondary | ICD-10-CM | POA: Diagnosis not present

## 2020-02-13 DIAGNOSIS — Z79899 Other long term (current) drug therapy: Secondary | ICD-10-CM | POA: Diagnosis not present

## 2020-02-13 DIAGNOSIS — Z880 Allergy status to penicillin: Secondary | ICD-10-CM

## 2020-02-13 DIAGNOSIS — Z888 Allergy status to other drugs, medicaments and biological substances status: Secondary | ICD-10-CM

## 2020-02-13 DIAGNOSIS — K219 Gastro-esophageal reflux disease without esophagitis: Secondary | ICD-10-CM | POA: Diagnosis present

## 2020-02-13 DIAGNOSIS — Z951 Presence of aortocoronary bypass graft: Secondary | ICD-10-CM

## 2020-02-13 DIAGNOSIS — E7800 Pure hypercholesterolemia, unspecified: Secondary | ICD-10-CM | POA: Diagnosis present

## 2020-02-13 DIAGNOSIS — Z886 Allergy status to analgesic agent status: Secondary | ICD-10-CM | POA: Diagnosis not present

## 2020-02-13 DIAGNOSIS — I70211 Atherosclerosis of native arteries of extremities with intermittent claudication, right leg: Principal | ICD-10-CM | POA: Diagnosis present

## 2020-02-13 DIAGNOSIS — E039 Hypothyroidism, unspecified: Secondary | ICD-10-CM | POA: Diagnosis present

## 2020-02-13 DIAGNOSIS — Z7902 Long term (current) use of antithrombotics/antiplatelets: Secondary | ICD-10-CM

## 2020-02-13 DIAGNOSIS — Z833 Family history of diabetes mellitus: Secondary | ICD-10-CM | POA: Diagnosis not present

## 2020-02-13 HISTORY — PX: ENDARTERECTOMY FEMORAL: SHX5804

## 2020-02-13 LAB — CREATININE, SERUM
Creatinine, Ser: 1.14 mg/dL (ref 0.61–1.24)
GFR calc Af Amer: >60 mL/min (ref >60)
GFR calc non Af Amer: >60 mL/min (ref >60)

## 2020-02-13 LAB — CBC
HCT: 40.4 % (ref 39.0–52.0)
Hemoglobin: 13.6 g/dL (ref 13.0–17.0)
MCH: 31.4 pg (ref 26.0–34.0)
MCHC: 33.7 g/dL (ref 30.0–36.0)
MCV: 93.3 fL (ref 80.0–100.0)
Platelets: 189 10*3/uL (ref 150–400)
RBC: 4.33 MIL/uL (ref 4.22–5.81)
RDW: 15.6 % — ABNORMAL HIGH (ref 11.5–15.5)
WBC: 17.3 10*3/uL — ABNORMAL HIGH (ref 4.0–10.5)
nRBC: 0 % (ref 0.0–0.2)

## 2020-02-13 LAB — POCT ACTIVATED CLOTTING TIME: Activated Clotting Time: 279 seconds

## 2020-02-13 SURGERY — ENDARTERECTOMY, FEMORAL
Anesthesia: General | Site: Groin | Laterality: Right

## 2020-02-13 MED ORDER — ALUM & MAG HYDROXIDE-SIMETH 200-200-20 MG/5ML PO SUSP
15.0000 mL | ORAL | Status: DC | PRN
  Administered 2020-02-13: 30 mL via ORAL
  Filled 2020-02-13: qty 30

## 2020-02-13 MED ORDER — PROTAMINE SULFATE 10 MG/ML IV SOLN
INTRAVENOUS | Status: DC | PRN
  Administered 2020-02-13: 50 mg via INTRAVENOUS
  Administered 2020-02-13: 25 mg via INTRAVENOUS

## 2020-02-13 MED ORDER — SODIUM CHLORIDE 0.9 % IV SOLN: 500.0000 mL | Freq: Once | INTRAVENOUS | Status: DC | PRN

## 2020-02-13 MED ORDER — SODIUM CHLORIDE 0.9 % IV SOLN
INTRAVENOUS | Status: AC
  Filled 2020-02-13: qty 1.2

## 2020-02-13 MED ORDER — MAGNESIUM SULFATE 2 GM/50ML IV SOLN: 2.0000 g | Freq: Every day | INTRAVENOUS | Status: DC | PRN

## 2020-02-13 MED ORDER — EPHEDRINE SULFATE 50 MG/ML IJ SOLN
INTRAMUSCULAR | Status: DC | PRN
  Administered 2020-02-13: 10 mg via INTRAVENOUS

## 2020-02-13 MED ORDER — PRAVASTATIN SODIUM 40 MG PO TABS
40.0000 mg | ORAL_TABLET | Freq: Every day | ORAL | Status: DC
  Administered 2020-02-13 – 2020-02-14 (×2): 40 mg via ORAL
  Filled 2020-02-13 (×2): qty 1

## 2020-02-13 MED ORDER — LEVOTHYROXINE SODIUM 75 MCG PO TABS
75.0000 ug | ORAL_TABLET | Freq: Every day | ORAL | Status: DC
  Administered 2020-02-14 – 2020-02-15 (×2): 75 ug via ORAL
  Filled 2020-02-13 (×2): qty 1

## 2020-02-13 MED ORDER — ROCURONIUM BROMIDE 10 MG/ML (PF) SYRINGE
PREFILLED_SYRINGE | INTRAVENOUS | Status: DC | PRN
  Administered 2020-02-13: 50 mg via INTRAVENOUS
  Administered 2020-02-13: 30 mg via INTRAVENOUS

## 2020-02-13 MED ORDER — LABETALOL HCL 5 MG/ML IV SOLN: 10.0000 mg | INTRAVENOUS | Status: DC | PRN

## 2020-02-13 MED ORDER — POLYETHYLENE GLYCOL 3350 17 G PO PACK: 17.0000 g | PACK | Freq: Every day | ORAL | Status: DC | PRN

## 2020-02-13 MED ORDER — GUAIFENESIN-DM 100-10 MG/5ML PO SYRP: 15.0000 mL | ORAL_SOLUTION | ORAL | Status: DC | PRN

## 2020-02-13 MED ORDER — ORAL CARE MOUTH RINSE: 15.0000 mL | Freq: Once | OROMUCOSAL | Status: AC

## 2020-02-13 MED ORDER — CO-ENZYME Q-10 100 MG PO CAPS: 100.0000 mg | ORAL_CAPSULE | Freq: Every day | ORAL | Status: DC

## 2020-02-13 MED ORDER — CHLORHEXIDINE GLUCONATE 4 % EX LIQD: 60.0000 mL | Freq: Once | CUTANEOUS | Status: DC

## 2020-02-13 MED ORDER — ASCORBIC ACID 500 MG PO TABS
500.0000 mg | ORAL_TABLET | Freq: Every day | ORAL | Status: DC
  Administered 2020-02-14: 500 mg via ORAL
  Filled 2020-02-13 (×2): qty 1

## 2020-02-13 MED ORDER — PRAMIPEXOLE DIHYDROCHLORIDE 1 MG PO TABS
1.0000 mg | ORAL_TABLET | Freq: Every day | ORAL | Status: DC | PRN
  Filled 2020-02-13: qty 1

## 2020-02-13 MED ORDER — HEPARIN SODIUM (PORCINE) 1000 UNIT/ML IJ SOLN
INTRAMUSCULAR | Status: DC | PRN
Start: 2020-02-13 — End: 2020-02-13
  Administered 2020-02-13: 7500 [IU] via INTRAVENOUS

## 2020-02-13 MED ORDER — HEPARIN SODIUM (PORCINE) 5000 UNIT/ML IJ SOLN
5000.0000 [IU] | Freq: Three times a day (TID) | INTRAMUSCULAR | Status: DC
  Administered 2020-02-14 – 2020-02-15 (×3): 5000 [IU] via SUBCUTANEOUS
  Filled 2020-02-13 (×3): qty 1

## 2020-02-13 MED ORDER — ACETAMINOPHEN 325 MG PO TABS
325.0000 mg | ORAL_TABLET | ORAL | Status: DC | PRN
  Administered 2020-02-14: 650 mg via ORAL
  Filled 2020-02-13: qty 2

## 2020-02-13 MED ORDER — FENTANYL CITRATE (PF) 100 MCG/2ML IJ SOLN: 25.0000 ug | INTRAMUSCULAR | Status: DC | PRN

## 2020-02-13 MED ORDER — SUGAMMADEX SODIUM 200 MG/2ML IV SOLN
INTRAVENOUS | Status: DC | PRN
  Administered 2020-02-13: 200 mg via INTRAVENOUS

## 2020-02-13 MED ORDER — HEMOSTATIC AGENTS (NO CHARGE) OPTIME
TOPICAL | Status: DC | PRN
  Administered 2020-02-13: 1 via TOPICAL

## 2020-02-13 MED ORDER — FENTANYL CITRATE (PF) 250 MCG/5ML IJ SOLN
INTRAMUSCULAR | Status: AC
  Filled 2020-02-13: qty 5

## 2020-02-13 MED ORDER — SODIUM CHLORIDE 0.9 % IV SOLN: INTRAVENOUS | Status: DC

## 2020-02-13 MED ORDER — VANCOMYCIN HCL IN DEXTROSE 1-5 GM/200ML-% IV SOLN
1000.0000 mg | INTRAVENOUS | Status: AC
  Administered 2020-02-13: 1000 mg via INTRAVENOUS
  Filled 2020-02-13: qty 200

## 2020-02-13 MED ORDER — CLOPIDOGREL BISULFATE 75 MG PO TABS
75.0000 mg | ORAL_TABLET | Freq: Every day | ORAL | Status: DC
  Administered 2020-02-14 – 2020-02-15 (×2): 75 mg via ORAL
  Filled 2020-02-13 (×2): qty 1

## 2020-02-13 MED ORDER — LIDOCAINE 2% (20 MG/ML) 5 ML SYRINGE
INTRAMUSCULAR | Status: DC | PRN
  Administered 2020-02-13: 60 mg via INTRAVENOUS

## 2020-02-13 MED ORDER — AMIODARONE HCL 200 MG PO TABS
200.0000 mg | ORAL_TABLET | Freq: Every day | ORAL | Status: DC
  Administered 2020-02-14 – 2020-02-15 (×2): 200 mg via ORAL
  Filled 2020-02-13 (×2): qty 1

## 2020-02-13 MED ORDER — POTASSIUM CHLORIDE CRYS ER 20 MEQ PO TBCR: 20.0000 meq | EXTENDED_RELEASE_TABLET | Freq: Every day | ORAL | Status: DC | PRN

## 2020-02-13 MED ORDER — ONDANSETRON HCL 4 MG/2ML IJ SOLN
INTRAMUSCULAR | Status: DC | PRN
  Administered 2020-02-13: 4 mg via INTRAVENOUS

## 2020-02-13 MED ORDER — PANTOPRAZOLE SODIUM 40 MG PO TBEC: 40.0000 mg | DELAYED_RELEASE_TABLET | Freq: Every day | ORAL | Status: DC | PRN

## 2020-02-13 MED ORDER — ROPINIROLE HCL 0.25 MG PO TABS
0.2500 mg | ORAL_TABLET | Freq: Every evening | ORAL | Status: DC | PRN
  Administered 2020-02-14: 0.25 mg via ORAL
  Filled 2020-02-13 (×3): qty 1

## 2020-02-13 MED ORDER — FENTANYL CITRATE (PF) 250 MCG/5ML IJ SOLN
INTRAMUSCULAR | Status: DC | PRN
  Administered 2020-02-13: 50 ug via INTRAVENOUS
  Administered 2020-02-13: 100 ug via INTRAVENOUS
  Administered 2020-02-13 (×2): 50 ug via INTRAVENOUS

## 2020-02-13 MED ORDER — LACTATED RINGERS IV SOLN: INTRAVENOUS | Status: DC

## 2020-02-13 MED ORDER — METOPROLOL TARTRATE 12.5 MG HALF TABLET
12.5000 mg | ORAL_TABLET | Freq: Two times a day (BID) | ORAL | Status: DC
  Administered 2020-02-13 – 2020-02-15 (×4): 12.5 mg via ORAL
  Filled 2020-02-13 (×4): qty 1

## 2020-02-13 MED ORDER — 0.9 % SODIUM CHLORIDE (POUR BTL) OPTIME
TOPICAL | Status: DC | PRN
  Administered 2020-02-13: 2000 mL

## 2020-02-13 MED ORDER — OXYCODONE-ACETAMINOPHEN 5-325 MG PO TABS
1.0000 | ORAL_TABLET | ORAL | Status: DC | PRN
  Administered 2020-02-15: 1 via ORAL
  Filled 2020-02-13: qty 1

## 2020-02-13 MED ORDER — METOPROLOL TARTRATE 5 MG/5ML IV SOLN: 2.0000 mg | INTRAVENOUS | Status: DC | PRN

## 2020-02-13 MED ORDER — ALBUMIN HUMAN 5 % IV SOLN
INTRAVENOUS | Status: DC | PRN
Start: 2020-02-13 — End: 2020-02-13

## 2020-02-13 MED ORDER — PROPOFOL 10 MG/ML IV BOLUS
INTRAVENOUS | Status: DC | PRN
  Administered 2020-02-13: 20 mg via INTRAVENOUS
  Administered 2020-02-13: 110 mg via INTRAVENOUS

## 2020-02-13 MED ORDER — PHENOL 1.4 % MT LIQD: 1.0000 | OROMUCOSAL | Status: DC | PRN

## 2020-02-13 MED ORDER — HYDRALAZINE HCL 20 MG/ML IJ SOLN: 5.0000 mg | INTRAMUSCULAR | Status: DC | PRN

## 2020-02-13 MED ORDER — PHENYLEPHRINE HCL-NACL 10-0.9 MG/250ML-% IV SOLN
INTRAVENOUS | Status: DC | PRN
  Administered 2020-02-13: 25 ug/min via INTRAVENOUS

## 2020-02-13 MED ORDER — CHLORHEXIDINE GLUCONATE 0.12 % MT SOLN
15.0000 mL | Freq: Once | OROMUCOSAL | Status: AC
  Administered 2020-02-13: 15 mL via OROMUCOSAL
  Filled 2020-02-13: qty 15

## 2020-02-13 MED ORDER — TRAZODONE HCL 50 MG PO TABS
50.0000 mg | ORAL_TABLET | Freq: Every evening | ORAL | Status: DC | PRN
  Administered 2020-02-13 – 2020-02-14 (×2): 100 mg via ORAL
  Filled 2020-02-13 (×2): qty 2

## 2020-02-13 MED ORDER — PANTOPRAZOLE SODIUM 40 MG PO TBEC
40.0000 mg | DELAYED_RELEASE_TABLET | Freq: Every day | ORAL | Status: DC
  Administered 2020-02-14 – 2020-02-15 (×2): 40 mg via ORAL
  Filled 2020-02-13 (×2): qty 1

## 2020-02-13 MED ORDER — DEXAMETHASONE SODIUM PHOSPHATE 10 MG/ML IJ SOLN
INTRAMUSCULAR | Status: DC | PRN
  Administered 2020-02-13: 10 mg via INTRAVENOUS

## 2020-02-13 MED ORDER — AMLODIPINE BESYLATE 5 MG PO TABS
2.5000 mg | ORAL_TABLET | Freq: Every day | ORAL | Status: DC
  Administered 2020-02-14 – 2020-02-15 (×2): 2.5 mg via ORAL
  Filled 2020-02-13 (×2): qty 1

## 2020-02-13 MED ORDER — SODIUM CHLORIDE 0.9 % IV SOLN
INTRAVENOUS | Status: DC | PRN
  Administered 2020-02-13: 500 mL

## 2020-02-13 MED ORDER — LOSARTAN POTASSIUM 50 MG PO TABS
50.0000 mg | ORAL_TABLET | Freq: Every day | ORAL | Status: DC
  Administered 2020-02-14 – 2020-02-15 (×2): 50 mg via ORAL
  Filled 2020-02-13: qty 1

## 2020-02-13 MED ORDER — BISACODYL 10 MG RE SUPP: 10.0000 mg | Freq: Every day | RECTAL | Status: DC | PRN

## 2020-02-13 MED ORDER — TAMSULOSIN HCL 0.4 MG PO CAPS
0.4000 mg | ORAL_CAPSULE | Freq: Every day | ORAL | Status: DC
  Administered 2020-02-13 – 2020-02-15 (×3): 0.4 mg via ORAL
  Filled 2020-02-13 (×3): qty 1

## 2020-02-13 MED ORDER — MORPHINE SULFATE (PF) 2 MG/ML IV SOLN: 2.0000 mg | INTRAVENOUS | Status: DC | PRN

## 2020-02-13 MED ORDER — ACETAMINOPHEN 10 MG/ML IV SOLN
INTRAVENOUS | Status: DC | PRN
  Administered 2020-02-13: 1000 mg via INTRAVENOUS

## 2020-02-13 MED ORDER — ADULT MULTIVITAMIN W/MINERALS CH
1.0000 | ORAL_TABLET | Freq: Every day | ORAL | Status: DC
  Administered 2020-02-14 – 2020-02-15 (×2): 1 via ORAL
  Filled 2020-02-13 (×2): qty 1

## 2020-02-13 MED ORDER — VANCOMYCIN HCL IN DEXTROSE 1-5 GM/200ML-% IV SOLN
1000.0000 mg | Freq: Two times a day (BID) | INTRAVENOUS | Status: AC
  Administered 2020-02-13 – 2020-02-14 (×2): 1000 mg via INTRAVENOUS
  Filled 2020-02-13 (×2): qty 200

## 2020-02-13 MED ORDER — ONDANSETRON HCL 4 MG/2ML IJ SOLN: 4.0000 mg | Freq: Four times a day (QID) | INTRAMUSCULAR | Status: DC | PRN

## 2020-02-13 MED ORDER — ACETAMINOPHEN 650 MG RE SUPP: 325.0000 mg | RECTAL | Status: DC | PRN

## 2020-02-13 MED ORDER — DOCUSATE SODIUM 100 MG PO CAPS
100.0000 mg | ORAL_CAPSULE | Freq: Every day | ORAL | Status: DC
  Administered 2020-02-14 – 2020-02-15 (×2): 100 mg via ORAL
  Filled 2020-02-13 (×2): qty 1

## 2020-02-13 SURGICAL SUPPLY — 50 items
ADH SKN CLS APL DERMABOND .7 (GAUZE/BANDAGES/DRESSINGS) ×1
CANISTER SUCT 3000ML PPV (MISCELLANEOUS) ×2 IMPLANT
CATH EMB 4FR 80CM (CATHETERS) ×1 IMPLANT
CLIP VESOCCLUDE MED 24/CT (CLIP) ×2 IMPLANT
CLIP VESOCCLUDE SM WIDE 24/CT (CLIP) ×2 IMPLANT
COVER WAND RF STERILE (DRAPES) ×1 IMPLANT
DERMABOND ADVANCED (GAUZE/BANDAGES/DRESSINGS) ×1
DERMABOND ADVANCED .7 DNX12 (GAUZE/BANDAGES/DRESSINGS) ×1 IMPLANT
DRAIN CHANNEL 15F RND FF W/TCR (WOUND CARE) IMPLANT
DRAPE X-RAY CASS 24X20 (DRAPES) IMPLANT
ELECT REM PT RETURN 9FT ADLT (ELECTROSURGICAL) ×2
ELECTRODE REM PT RTRN 9FT ADLT (ELECTROSURGICAL) ×1 IMPLANT
EVACUATOR SILICONE 100CC (DRAIN) IMPLANT
GLOVE BIO SURGEON STRL SZ 6.5 (GLOVE) ×1 IMPLANT
GLOVE BIOGEL PI IND STRL 6.5 (GLOVE) ×1 IMPLANT
GLOVE BIOGEL PI IND STRL 7.0 (GLOVE) IMPLANT
GLOVE BIOGEL PI IND STRL 7.5 (GLOVE) ×1 IMPLANT
GLOVE BIOGEL PI INDICATOR 6.5 (GLOVE) ×1
GLOVE BIOGEL PI INDICATOR 7.0 (GLOVE) ×1
GLOVE BIOGEL PI INDICATOR 7.5 (GLOVE) ×1
GLOVE SURG SS PI 7.5 STRL IVOR (GLOVE) ×2 IMPLANT
GOWN STRL REUS W/ TWL LRG LVL3 (GOWN DISPOSABLE) ×2 IMPLANT
GOWN STRL REUS W/ TWL XL LVL3 (GOWN DISPOSABLE) ×1 IMPLANT
GOWN STRL REUS W/TWL LRG LVL3 (GOWN DISPOSABLE) ×4
GOWN STRL REUS W/TWL XL LVL3 (GOWN DISPOSABLE) ×2
GRAFT VASC PATCH XENOSURE 1X14 (Vascular Products) ×1 IMPLANT
HEMOSTAT ARISTA ABSORB 3G PWDR (HEMOSTASIS) ×1 IMPLANT
HEMOSTAT SNOW SURGICEL 2X4 (HEMOSTASIS) IMPLANT
KIT BASIN OR (CUSTOM PROCEDURE TRAY) ×2 IMPLANT
KIT TURNOVER KIT B (KITS) ×2 IMPLANT
NS IRRIG 1000ML POUR BTL (IV SOLUTION) ×4 IMPLANT
PACK PERIPHERAL VASCULAR (CUSTOM PROCEDURE TRAY) ×2 IMPLANT
PAD ARMBOARD 7.5X6 YLW CONV (MISCELLANEOUS) ×4 IMPLANT
SET COLLECT BLD 21X3/4 12 (NEEDLE) IMPLANT
SET WALTER ACTIVATION W/DRAPE (SET/KITS/TRAYS/PACK) IMPLANT
SPONGE INTESTINAL PEANUT (DISPOSABLE) ×2 IMPLANT
SPONGE LAP 18X18 X RAY DECT (DISPOSABLE) ×1 IMPLANT
STOPCOCK 4 WAY LG BORE MALE ST (IV SETS) ×1 IMPLANT
SUT ETHILON 3 0 PS 1 (SUTURE) IMPLANT
SUT PROLENE 5 0 C 1 24 (SUTURE) ×3 IMPLANT
SUT PROLENE 6 0 BV (SUTURE) ×6 IMPLANT
SUT VIC AB 2-0 CT1 27 (SUTURE) ×2
SUT VIC AB 2-0 CT1 TAPERPNT 27 (SUTURE) ×1 IMPLANT
SUT VIC AB 3-0 SH 27 (SUTURE) ×2
SUT VIC AB 3-0 SH 27X BRD (SUTURE) ×1 IMPLANT
SUT VIC AB 3-0 X1 27 (SUTURE) ×2 IMPLANT
TOWEL GREEN STERILE (TOWEL DISPOSABLE) ×2 IMPLANT
TUBING EXTENTION W/L.L. (IV SETS) IMPLANT
UNDERPAD 30X36 HEAVY ABSORB (UNDERPADS AND DIAPERS) ×2 IMPLANT
WATER STERILE IRR 1000ML POUR (IV SOLUTION) ×2 IMPLANT

## 2020-02-13 NOTE — Anesthesia Procedure Notes (Signed)
Procedure Name: Intubation Date/Time: 02/13/2020 2:08 PM Performed by: Clearnce Sorrel, CRNA Pre-anesthesia Checklist: Patient identified, Emergency Drugs available, Suction available, Patient being monitored and Timeout performed Patient Re-evaluated:Patient Re-evaluated prior to induction Oxygen Delivery Method: Circle system utilized Preoxygenation: Pre-oxygenation with 100% oxygen Induction Type: IV induction Ventilation: Mask ventilation without difficulty Laryngoscope Size: Mac and 4 Grade View: Grade I Tube type: Oral Tube size: 7.5 mm Number of attempts: 1 Airway Equipment and Method: Stylet Placement Confirmation: ETT inserted through vocal cords under direct vision,  positive ETCO2 and breath sounds checked- equal and bilateral Secured at: 22 cm Tube secured with: Tape Dental Injury: Teeth and Oropharynx as per pre-operative assessment

## 2020-02-13 NOTE — Discharge Instructions (Signed)
[facility]  Discharge instructions  Lower Extremity Endarterectomy  Please refer to the following instruction for your post-procedure care. Your surgeon or physician assistant will discuss any changes with you.  Activity  You are encouraged to walk as much as you can. You can slowly return to normal activities during the month after your surgery. Avoid strenuous activity and heavy lifting until your doctor tells you it's OK. Avoid activities such as vacuuming or swinging a golf club. Do not drive until your doctor give the OK and you are no longer taking prescription pain medications. It is also normal to have difficulty with sleep habits, eating and bowel movement after surgery. These will go away with time.  Bathing/Showering  Shower daily after you go home. Do not soak in a bathtub, hot tub, or swim until the incision heals completely.  Incision Care  Clean your incision with mild soap and water. Shower every day. Pat the area dry with a clean towel. You do not need a bandage unless otherwise instructed. Do not apply any ointments or creams to your incision. If you have open wounds you will be instructed how to care for them or a visiting nurse may be arranged for you. If you have staples or sutures along your incision they will be removed at your post-op appointment. You may have skin glue on your incision. Do not peel it off. It will come off on its own in about one week.  Wash the groin wound with soap and water daily and pat dry. (No tub bath-only shower)  Then put a dry gauze or washcloth in the groin to keep this area dry to help prevent wound infection.  Do this daily and as needed.  Do not use Vaseline or neosporin on your incisions.  Only use soap and water on your incisions and then protect and keep dry.  Diet  Resume your normal diet. There are no special food restrictions following this procedure. A low fat/ low cholesterol diet is  recommended for all patients with vascular disease. In order to heal from your surgery, it is CRITICAL to get adequate nutrition. Your body requires vitamins, minerals, and protein. Vegetables are the best source of vitamins and minerals. Vegetables also provide the perfect balance of protein. Processed food has little nutritional value, so try to avoid this.  Medications  Resume taking all your medications unless your doctor or physician assistant tells you not to. If your incision is causing pain, you may take over-the-counter pain relievers such as acetaminophen (Tylenol). If you were prescribed a stronger pain medication, please aware these medication can cause nausea and constipation. Prevent nausea by taking the medication with a snack or meal. Avoid constipation by drinking plenty of fluids and eating foods with high amount of fiber, such as fruits, vegetables, and grains. Take Colace 100 mg (an over-the-counter stool softener) twice a day as needed for constipation.  Do not take Tylenol if you are taking prescription pain medications.  Follow Up  Our office will schedule a follow up appointment 2-3 weeks following discharge.  Please call us immediately for any of the following conditions  .Severe or worsening pain in your legs or feet while at rest or while walking .Increase pain, redness, warmth, or drainage (pus) from your incision site(s) . Fever of 101 degree or higher . The swelling in your leg with the bypass suddenly worsens and becomes more painful than when you were in the hospital . If  you have been instructed to feel your graft pulse then you should do so every day. If you can no longer feel this pulse, call the office immediately. Not all patients are given this instruction. .  Leg swelling is common after leg bypass surgery.  The swelling should improve over a few months following surgery. To improve the swelling, you may elevate your legs above the level of your heart while  you are sitting or resting. Your surgeon or physician assistant may ask you to apply an ACE wrap or wear compression (TED) stockings to help to reduce swelling.  Reduce your risk of vascular disease  Stop smoking. If you would like help call [facility] at 1-800-QUIT-NOW 872-178-3995) or [facility] at 438-219-4799.  . Manage your cholesterol . Maintain a desired weight . Control your diabetes weight . Control your diabetes . Keep your blood pressure down .  If you have any questions, please call the office at [phone]

## 2020-02-13 NOTE — Anesthesia Postprocedure Evaluation (Signed)
Anesthesia Post Note  Patient: [name]  Procedure(s) Performed: RIGHT ENDARTERECTOMY FEMORAL WITH BOVINE PATCH (Right Groin)     Patient location during evaluation: PACU Anesthesia Type: General Level of consciousness: awake Pain management: pain level controlled Vital Signs Assessment: post-procedure vital signs reviewed and stable Respiratory status: spontaneous breathing Cardiovascular status: stable Postop Assessment: no apparent nausea or vomiting Anesthetic complications: no   No complications documented.  Last Vitals:  Vitals:   02/13/20 1700 02/13/20 1758  BP: (!) 144/60 (!) 158/65  Pulse: 79 76  Resp: 15 (!) 21  Temp:  36.4 C  SpO2: 96% 96%    Last Pain:  Vitals:   02/13/20 1758  PainSc: 0-No pain        RLE Motor Response: Responds to commands (02/13/20 1758) RLE Sensation: Full sensation (02/13/20 1758)      Jahi Roza

## 2020-02-13 NOTE — Transfer of Care (Signed)
Immediate Anesthesia Transfer of Care Note  Patient: Ryan Pittman  Procedure(s) Performed: RIGHT ENDARTERECTOMY FEMORAL WITH BOVINE PATCH (Right Groin)  Patient Location: PACU  Anesthesia Type:General  Level of Consciousness: awake, alert  and oriented  Airway & Oxygen Therapy: Patient Spontanous Breathing  Post-op Assessment: Report given to RN and Post -op Vital signs reviewed and stable  Post vital signs: Reviewed and stable  Last Vitals:  Vitals Value Taken Time  BP 144/58 02/13/20 1632  Temp 36.3 C 02/13/20 1633  Pulse 80 02/13/20 1641  Resp 22 02/13/20 1641  SpO2 90 % 02/13/20 1641  Vitals shown include unvalidated device data.  Last Pain:  Vitals:   02/13/20 1633  PainSc: 0-No pain      Patients Stated Pain Goal: 3 (53/29/92 4268)  Complications: No complications documented.

## 2020-02-13 NOTE — Anesthesia Procedure Notes (Signed)
Arterial Line Insertion Start/End6/07/2020 11:15 AM, 02/13/2020 11:40 AM Performed by: Leonor Liv, CRNA, CRNA  Patient location: Pre-op. Preanesthetic checklist: patient identified, IV checked, site marked, risks and benefits discussed, surgical consent, monitors and equipment checked, pre-op evaluation, timeout performed and anesthesia consent Lidocaine 1% used for infiltration Right, radial was placed Catheter size: 20 G Hand hygiene performed  and maximum sterile barriers used  Allen's test indicative of satisfactory collateral circulation Attempts: 1 Procedure performed without using ultrasound guided technique. Following insertion, dressing applied and Biopatch. Post procedure assessment: normal  Patient tolerated the procedure well with no immediate complications.

## 2020-02-13 NOTE — Progress Notes (Signed)
  Day of Surgery Note    Subjective:  No complaints.  Resting comfortably in pacu.   Vitals:   02/13/20 0915 02/13/20 1633  BP: (!) 187/53   Pulse: (!) 57   Resp: 20   Temp: (!) 97.5 F (36.4 C) (!) 97.4 F (36.3 C)  SpO2: 99% 93%    Incisions:   Right groin incision is clean and dry  Extremities:  Brisk right PT doppler signal > Peroneal > DP Cardiac:  regular Lungs:  Non labored   Assessment/Plan:  This is a 79 y.o. male who is s/p  Right femoral endarterectomy  -pt doing well in recovery with good doppler flow to right foot. -to 4 east later today -possibly home over the weekend if uneventful post op course.  -plavix ordered to restart 6/12   Leontine Locket, PA-C 02/13/2020 4:48 PM [phone]

## 2020-02-13 NOTE — Interval H&P Note (Signed)
History and Physical Interval Note:  02/13/2020 9:42 AM  [name]  has presented today for surgery, with the diagnosis of PERIPHERAL ARTERY DISEASE.  The various methods of treatment have been discussed with the patient and family. After consideration of risks, benefits and other options for treatment, the patient has consented to  Procedure(s): RIGHT ENDARTERECTOMY FEMORAL WITH VEIN PATCH (Right) as a surgical intervention.  The patient's history has been reviewed, patient examined, no change in status, stable for surgery.  I have reviewed the patient's chart and labs.  Questions were answered to the patient's satisfaction.     Ryan Pittman

## 2020-02-13 NOTE — Op Note (Signed)
Patient name: Ryan Pittman MRN: [id number] DOB: May 07, 1941 Sex: male  02/13/2020 Pre-operative Diagnosis: Severe right leg claudication Post-operative diagnosis:  Same Surgeon:  [name] Assistants: Izetta Dakin Procedure:   Right external iliac, common femoral, profundofemoral, and superficial femoral endarterectomy with bovine pericardial patch angioplasty Anesthesia: General Blood Loss: 100 cc Specimens: None  Findings: Extensive near occlusive plaque within the common femoral artery which extended up into the distal external iliac artery as well as into the proximal profundofemoral artery.  I was able to get a good endpoint approximately 3 cm onto the superficial femoral artery.  A long 8 cm bovine patch was placed.  Indications: This is a 79 year old gentleman with severe right leg claudication.  Angiography identified near occlusive plaque in the common femoral artery as well as the proximal superficial femoral and profundofemoral artery.  He comes in today for endarterectomy.  Procedure:  The patient was identified in the holding area and taken to [place] [facility]  The patient was then placed supine on the table. general anesthesia was administered.  The patient was prepped and draped in the usual sterile fashion.  A time out was called and antibiotics were administered.  A longitudinal incision was made in the right groin.  Cautery was used about subcutaneous tissue.  The femoral sheath was identified and opened sharply.  I dissected out the distal external iliac artery as well as the common femoral artery.  2 main profunda branches were individually isolated.  I continued dissection down the superficial femoral artery for approximately 3 cm where it became soft.  Once adequate exposure was obtained, the patient was fully heparinized.  After the heparin circulated, the vessels were occluded.  A #11 blade was used to make an arteriotomy which was extended longitudinally with Potts  scissors.  The arteriotomy went from the circumflex vessels down onto the superficial femoral artery for approximately 3 cm.  Total length of the arteriotomy was approximately 8 cm.  Endarterectomy was then performed using a Hector [place].  I performed a eversion endarterectomy of the profundofemoral artery down to the primary branch.  I was able to get a good distal endpoint on the superficial femoral artery.  I did tacked this down with 6-0 Prolene sutures.  I then inserted a Fogarty balloon up into the external iliac artery and removed the proximal clamp.  This enabled me to reach up with a hemostat and remove the remaining plaque in the external iliac artery.  At this point, there was excellent inflow.  The Fogarty catheter was removed and a Henley clamp was replaced.  All potential embolic debris was removed.  A bovine pericardial patch was selected.  Patch angioplasty was performed with a running 5-0 Prolene.  Prior to completion the appropriate flushing maneuvers were performed and the anastomosis was completed.  The clamps were released and blood flow was reestablished to the right leg.  Hand-held Doppler was used to evaluate signals in the profunda and superficial femoral artery which were multiphasic.  75 mg of protamine was then administered.  Once hemostasis was achieved, the wound was irrigated.  The femoral sheath was reapproximated with 2-0 Vicryl.  The subcutaneous tissue was then closed with 2 additional layers of 2-0 and 3-0 Vicryl.  The skin was closed with 4-0 Vicryl.  Dermabond was applied.  There were no immediate complications.  He was successfully extubated and taken to recovery in stable condition.   Disposition: To PACU stable.   Franciso Bend  Trula Slade, M.D., [facility] [place] Office: [phone] Pager:  [phone]

## 2020-02-14 LAB — BASIC METABOLIC PANEL
Anion gap: 11 (ref 5–15)
BUN: 18 mg/dL (ref 8–23)
CO2: 20 mmol/L — ABNORMAL LOW (ref 22–32)
Calcium: 8.7 mg/dL — ABNORMAL LOW (ref 8.9–10.3)
Chloride: 104 mmol/L (ref 98–111)
Creatinine, Ser: 1.52 mg/dL — ABNORMAL HIGH (ref 0.61–1.24)
GFR calc Af Amer: 49 mL/min — ABNORMAL LOW (ref >60)
GFR calc non Af Amer: 43 mL/min — ABNORMAL LOW (ref >60)
Glucose, Bld: 307 mg/dL — ABNORMAL HIGH (ref 70–99)
Potassium: 4.6 mmol/L (ref 3.5–5.1)
Sodium: 135 mmol/L (ref 135–145)

## 2020-02-14 LAB — CBC
HCT: 37.2 % — ABNORMAL LOW (ref 39.0–52.0)
Hemoglobin: 12.3 g/dL — ABNORMAL LOW (ref 13.0–17.0)
MCH: 30.8 pg (ref 26.0–34.0)
MCHC: 33.1 g/dL (ref 30.0–36.0)
MCV: 93.2 fL (ref 80.0–100.0)
Platelets: 190 10*3/uL (ref 150–400)
RBC: 3.99 MIL/uL — ABNORMAL LOW (ref 4.22–5.81)
RDW: 15.6 % — ABNORMAL HIGH (ref 11.5–15.5)
WBC: 17 10*3/uL — ABNORMAL HIGH (ref 4.0–10.5)
nRBC: 0 % (ref 0.0–0.2)

## 2020-02-14 NOTE — Progress Notes (Addendum)
[facility] [place]  Subjective  - Doing well states right LE feels better.   Objective (!) 134/53 64 98 F (36.7 C) (Oral) 16 98%  Intake/Output Summary (Last 24 hours) at 02/14/2020 0835 Last data filed at 02/14/2020 4481 Gross per 24 hour  Intake 2601.25 ml  Output 1250 ml  Net 1351.25 ml    Right groin soft, mod ecchymosis  Right DP/PT/peroneal doppler signals Lungs non labored breathing  Assessment/Planning: POD # 1 Right external iliac, common femoral, profundofemoral, and superficial femoral endarterectomy with bovine pericardial patch angioplasty  Ambulated in room bloody drainage at groin incision.  No drainage since.   HGB stable 12.3, groin soft ok to attempt ambulation again. Will stop IV fluids cont. Plavix and SQ heparin.   Roxy Horseman 02/14/2020 8:35 AM --  Laboratory Lab Results: Recent Labs    02/13/20 1849 02/14/20 0000  WBC 17.3* 17.0*  HGB 13.6 12.3*  HCT 40.4 37.2*  PLT 189 190   BMET Recent Labs    02/13/20 1849 02/14/20 0000  NA  --  135  K  --  4.6  CL  --  104  CO2  --  20*  GLUCOSE  --  307*  BUN  --  18  CREATININE 1.14 1.52*  CALCIUM  --  8.7*    COAG Lab Results  Component Value Date   INR 0.9 02/03/2020   INR 1.27 07/11/2018   INR 0.95 07/10/2018   No results found for: PTT  I have seen and evaluated the patient. I agree with the PA note as documented above.  Postop day 1 status post right femoral endarterectomy with bovine patch.  He has dorsalis pedis signal and more brisk posterior tibial signal.  States his right foot feels better.  Groin looks good.  Hemoglobin 13.6 --> 12.3.  Slight bump in creatinine will check again tomorrow.  UOP 1100 mL.  Marty Heck, MD [facility] [place] Office: [phone]

## 2020-02-14 NOTE — Evaluation (Signed)
Physical Therapy Evaluation Patient Details Name: Ryan Pittman MRN: [id number] DOB: [birth year] Today's Date: 02/14/2020   History of Present Illness  Pt is an 79 y/o male now s/p Right external iliac, common femoral, profundofemoral, and superficial femoral endarterectomy with bovine pericardial patch angioplasty on 6/11. PMHx includes anemia, arthritis, CA, CKD, CAD, COVID-19, HTN, PNA, PVD, L heart cath and coronary angiography (06/2018), abdominal aortogram with LE (06/2019).  Clinical Impression  Patient is s/p above surgery resulting in functional limitations due to the deficits listed below (see PT Problem List). Patient ambulating well with and without assistive device; some antalgic pattern noted however no loss of balance. Based on functional ability presented today and wife support, patient adequate for d/c from rehab standpoint once medially ready. We will continue to follow acutely until d/c.   Follow Up Recommendations No PT follow up    Equipment Recommendations  None recommended by PT    Recommendations for Other Services       Precautions / Restrictions Precautions Precautions: Fall Restrictions Weight Bearing Restrictions: No      Mobility  Bed Mobility Overal bed mobility: Needs Assistance Bed Mobility: Supine to Sit     Supine to sit: Supervision     General bed mobility comments: In recliner  Transfers Overall transfer level: Needs assistance Equipment used: Rolling walker (2 wheeled) Transfers: Sit to/from Stand Sit to Stand: Min guard         General transfer comment: Cues for hand placment. Good power-up from recliner. No LOB noted.  Ambulation/Gait Ambulation/Gait assistance: Supervision Gait Distance (Feet): 125 Feet Assistive device: Rolling walker (2 wheeled);None Gait Pattern/deviations: Step-through pattern;Antalgic Gait velocity: decreased Gait velocity interpretation: 1.31 - 2.62 ft/sec, indicative of limited community  ambulator General Gait Details: First half of distance with RW, supervision for safety, equipment/lines/leads assist only, second half of distance with moderately antalgic gait pattern but no overt LOB noted, able to take backwards steps and turn safely.  Stairs            Wheelchair Mobility    Modified Rankin (Stroke Patients Only)       Balance Overall balance assessment: Needs assistance Sitting-balance support: Feet supported Sitting balance-Leahy Scale: Good     Standing balance support: No upper extremity supported Standing balance-Leahy Scale: Fair Standing balance comment: able to maintain static balance without UE support                             Pertinent Vitals/Pain Pain Assessment: Faces Faces Pain Scale: Hurts a little bit Pain Location: incisional Pain Descriptors / Indicators: Discomfort Pain Intervention(s): Monitored during session;Repositioned    Home Living Family/patient expects to be discharged to:: Private residence Living Arrangements: Spouse/significant other Available Help at Discharge: Family Type of Home: House Home Access: Stairs to enter   [employer] of Steps: 1 step (no railing) to enter from back; 3-4 steps with B railing to enter from front Home Layout: Two level;Able to live on main level with bedroom/bathroom Home Equipment: Grab bars - tub/shower;Hand held shower head;Shower seat - built in;Walker - 2 wheels;Bedside commode      Prior Function Level of Independence: Independent         Comments: works full time, owns a Therapist, sports        Extremity/Trunk Assessment   Upper Extremity Assessment Upper Extremity Assessment: Defer to OT evaluation    Lower Extremity Assessment Lower  Extremity Assessment: Overall WFL for tasks assessed       Communication   Communication: HOH (mildly)  Cognition Arousal/Alertness: Awake/alert Behavior During Therapy: WFL for tasks  assessed/performed Overall Cognitive Status: Within Functional Limits for tasks assessed                                        General Comments General comments (skin integrity, edema, etc.): Wound without drainage    Exercises General Exercises - Lower Extremity Ankle Circles/Pumps: AROM;Both;15 reps;Seated Quad Sets: Strengthening;Both;15 reps;Seated Gluteal Sets: Strengthening;Both;15 reps;Seated   Assessment/Plan    PT Assessment Patient needs continued PT services  PT Problem List Decreased strength;Decreased balance;Decreased mobility;Pain       PT Treatment Interventions DME instruction;Gait training;Functional mobility training;Therapeutic activities;Therapeutic exercise;Balance training;Stair training    PT Goals (Current goals can be found in the Care Plan section)  Acute Rehab PT Goals Patient Stated Goal: home when able - maintain his independence  PT Goal Formulation: With patient/family Time For Goal Achievement: 02/28/20 Potential to Achieve Goals: Good    Frequency Min 3X/week   Barriers to discharge        Co-evaluation               AM-PAC PT "6 Clicks" Mobility  Outcome Measure Help needed turning from your back to your side while in a flat bed without using bedrails?: None Help needed moving from lying on your back to sitting on the side of a flat bed without using bedrails?: None Help needed moving to and from a bed to a chair (including a wheelchair)?: None Help needed standing up from a chair using your arms (e.g., wheelchair or bedside chair)?: None Help needed to walk in hospital room?: A Little Help needed climbing 3-5 steps with a railing? : A Little 6 Click Score: 22    End of Session Equipment Utilized During Treatment: Gait belt Activity Tolerance: Patient tolerated treatment well Patient left: in chair;with call bell/phone within reach;with chair alarm set;with family/visitor present Nurse Communication:  Mobility status PT Visit Diagnosis: Other abnormalities of gait and mobility (R26.89);Pain Pain - Right/Left: Right Pain - part of body:  (groin)    Time: 1308-6578 PT Time Calculation (min) (ACUTE ONLY): 12 min   Charges:   PT Evaluation $PT Eval Low Complexity: 1 Low          [place], PT   Ellouise Newer 02/14/2020, 1:59 PM

## 2020-02-14 NOTE — Progress Notes (Signed)
Pt dangled to the side of the bed, ambulated to the sink to take a bath and the incision started to bleed. Pt put back to bed pressure was held until the bleeding stopped.

## 2020-02-14 NOTE — Plan of Care (Signed)
POC initiated and progressing. 

## 2020-02-14 NOTE — Evaluation (Signed)
Occupational Therapy Evaluation Patient Details Name: Ryan Pittman MRN: [id number] DOB: [birth year] Today's Date: 02/14/2020    History of Present Illness Pt is an 79 y/o male now s/p Right external iliac, common femoral, profundofemoral, and superficial femoral endarterectomy with bovine pericardial patch angioplasty on 6/11. PMHx includes anemia, arthritis, CA, CKD, CAD, COVID-19, HTN, PNA, PVD, L heart cath and coronary angiography (06/2018), abdominal aortogram with LE (06/2019).   Clinical Impression   This 79 y/o male presents with the above. 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 Pt eager to mobilize with therapy today; overall completing room and short distance mobility into hallway using RW at minguard assist level, requiring minguard assist for toileting and standing grooming ADL, up to minA for LB ADL given RLE limitations. Anticipate pt to make steady progress with therapies, spouse present and supportive throughout session. Pt will benefit from continued acute OT services to maximize his overall safety and independence with ADL and mobility. Do not anticipate he will require follow up OT services after discharge.     Follow Up Recommendations  No OT follow up;Supervision - Intermittent    Equipment Recommendations  None recommended by OT (pt's DME needs are met )           Precautions / Restrictions Precautions Precautions: Fall Restrictions Weight Bearing Restrictions: No      Mobility Bed Mobility Overal bed mobility: Needs Assistance Bed Mobility: Supine to Sit     Supine to sit: Supervision     General bed mobility comments: for lines and safety, HOB slightly elevated   Transfers Overall transfer level: Needs assistance Equipment used: Rolling walker (2 wheeled) Transfers: Sit to/from Stand Sit to Stand: Min guard         General transfer comment: for balance and  safety with initial stand    Balance Overall balance assessment: Needs assistance Sitting-balance support: Feet supported Sitting balance-Leahy Scale: Good     Standing balance support: Bilateral upper extremity supported;No upper extremity supported;During functional activity Standing balance-Leahy Scale: Fair Standing balance comment: able to maintain static balance without UE support                           ADL either performed or assessed with clinical judgement   ADL Overall ADL's : Needs assistance/impaired Eating/Feeding: Modified independent;Sitting   Grooming: Min guard;Standing   Upper Body Bathing: Set up;Supervision/ safety;Sitting   Lower Body Bathing: Min guard;Sit to/from stand   Upper Body Dressing : Set up;Supervision/safety;Sitting   Lower Body Dressing: Minimal assistance;Min guard;Sit to/from stand   Toilet Transfer: Min guard;Ambulation;RW   Toileting- Clothing Manipulation and Hygiene: Supervision/safety;Sit to/from stand Toileting - Clothing Manipulation Details (indicate cue type and reason): pt standing to void bladder in bathroom     Functional mobility during ADLs: Min guard;Rolling walker                           Pertinent Vitals/Pain Pain Assessment: Faces Faces Pain Scale: Hurts a little bit Pain Location: incisional Pain Descriptors / Indicators: Discomfort Pain Intervention(s): Monitored during session;Repositioned     Hand Dominance     Extremity/Trunk Assessment Upper Extremity Assessment Upper Extremity Assessment: Overall WFL for tasks assessed   Lower Extremity Assessment Lower Extremity Assessment: Defer to PT evaluation       Communication Communication Communication: HOH (mildly)  Cognition Arousal/Alertness: Awake/alert Behavior During Therapy: WFL for tasks assessed/performed Overall Cognitive Status: Within Functional Limits for tasks assessed                                      General Comments  VSS on RA, incision with some dried blood from earlier this AM    Exercises     Shoulder Instructions      Home Living Family/patient expects to be discharged to:: Private residence Living Arrangements: Spouse/significant other Available Help at Discharge: Family Type of Home: House Home Access: Stairs to enter [employer] of Steps: 1 step (no railing) to enter from back; 3-4 steps with B railing to enter from front   Home Layout: Two level;Able to live on main level with bedroom/bathroom Alternate Level Stairs-Number of Steps: flight   Bathroom Shower/Tub: Occupational psychologist: Standard     Home Equipment: Grab bars - tub/shower;Hand held shower head;Shower seat - built in;Walker - 2 wheels;Bedside commode          Prior Functioning/Environment Level of Independence: Independent        Comments: works full time, owns a Engineer, materials Problem List: Decreased strength;Decreased activity tolerance;Decreased range of motion;Impaired balance (sitting and/or standing);Decreased knowledge of use of DME or AE;Pain      OT Treatment/Interventions: Self-care/ADL training;Therapeutic exercise;Energy conservation;DME and/or AE instruction;Therapeutic activities;Patient/family education;Balance training    OT Goals(Current goals can be found in the care plan section) Acute Rehab OT Goals Patient Stated Goal: home when able - maintain his independence  OT Goal Formulation: With patient Time For Goal Achievement: 02/28/20 Potential to Achieve Goals: Good  OT Frequency: Min 2X/week   Barriers to D/C:            Co-evaluation              AM-PAC OT "6 Clicks" Daily Activity     Outcome Measure Help from another person eating meals?: None Help from another person taking care of personal grooming?: A Little Help from another person toileting, which includes using toliet, bedpan, or urinal?: A Little Help from  another person bathing (including washing, rinsing, drying)?: A Little Help from another person to put on and taking off regular upper body clothing?: A Little Help from another person to put on and taking off regular lower body clothing?: A Little 6 Click Score: 19   End of Session Equipment Utilized During Treatment: Gait belt;Rolling walker Nurse Communication: Mobility status  Activity Tolerance: Patient tolerated treatment well Patient left: in chair;with call bell/phone within reach;with chair alarm set;with family/visitor present  OT Visit Diagnosis: Other abnormalities of gait and mobility (R26.89);Pain Pain - Right/Left: Right Pain - part of body:  (groin)                Time: 8032-1224 OT Time Calculation (min): 39 min Charges:  OT General Charges $OT Visit: 1 Visit OT Evaluation $OT Eval Moderate Complexity: 1 Mod OT Treatments $Self Care/Home Management : 8-22 mins  Lou Cal, OT Acute Rehabilitation Services Pager [phone] Office [place] 02/14/2020, 1:33 PM

## 2020-02-15 LAB — BASIC METABOLIC PANEL
Anion gap: 11 (ref 5–15)
BUN: 19 mg/dL (ref 8–23)
CO2: 23 mmol/L (ref 22–32)
Calcium: 9 mg/dL (ref 8.9–10.3)
Chloride: 105 mmol/L (ref 98–111)
Creatinine, Ser: 1.23 mg/dL (ref 0.61–1.24)
GFR calc Af Amer: >60 mL/min (ref >60)
GFR calc non Af Amer: 55 mL/min — ABNORMAL LOW (ref >60)
Glucose, Bld: 130 mg/dL — ABNORMAL HIGH (ref 70–99)
Potassium: 4.1 mmol/L (ref 3.5–5.1)
Sodium: 139 mmol/L (ref 135–145)

## 2020-02-15 MED ORDER — OXYCODONE HCL 5 MG PO TABS: 5.0000 mg | ORAL_TABLET | Freq: Four times a day (QID) | ORAL | 0 refills | Status: DC | PRN

## 2020-02-15 NOTE — Progress Notes (Addendum)
[facility] [place]  Subjective  - Doing well and ready to go home.   Objective (!) 159/58 61 98 F (36.7 C) (Oral) 16 100%  Intake/Output Summary (Last 24 hours) at 02/15/2020 0817 Last data filed at 02/15/2020 0604 Gross per 24 hour  Intake 883.75 ml  Output 1300 ml  Net -416.25 ml   Right groin soft, mod ecchymosis  Right DP/PT/peroneal doppler signals Lungs non labored breathing   Assessment/Planning: POD # 2 Right external iliac, common femoral, profundofemoral, and superficial femoral endarterectomy with bovine pericardial patch angioplasty  Cr was elevated above baseline post op.  Plan for BMET prior to discharge.  Urine OP was good 1300 last 24 hours. Plan for discharge home F/U in 2-3 weeks  Ryan Pittman 02/15/2020 8:17 AM --  Laboratory Lab Results: Recent Labs    02/13/20 1849 02/14/20 0000  WBC 17.3* 17.0*  HGB 13.6 12.3*  HCT 40.4 37.2*  PLT 189 190   BMET Recent Labs    02/13/20 1849 02/14/20 0000  NA  --  135  K  --  4.6  CL  --  104  CO2  --  20*  GLUCOSE  --  307*  BUN  --  18  CREATININE 1.14 1.52*  CALCIUM  --  8.7*    COAG Lab Results  Component Value Date   INR 0.9 02/03/2020   INR 1.27 07/11/2018   INR 0.95 07/10/2018   No results found for: PTT   I have seen and evaluated the patient. I agree with the PA note as documented above.  Postop day 2 status post right femoral endarterectomy.  Groin looks good except for some mild bruising.  States the rest pain in his foot is much better.  He has walked he wants to go home.  We will check a BMET this morning to ensure his creatinine is stable given bump yesterday.  Plan to follow-up in 2 to 3 weeks for groin check in the office.  Right doppler signals.  Marty Heck, MD [facility] [place] Office: [phone]

## 2020-02-15 NOTE — Progress Notes (Signed)
D/C instructions given to patient and wife. Medications and wound care reviewed. All questions answered. IV removed, clean and intact. Wife to escort pt home.  Clyde Canterbury, RN

## 2020-02-16 ENCOUNTER — Encounter (HOSPITAL_COMMUNITY): Payer: Self-pay | Admitting: Surgery

## 2020-02-17 DIAGNOSIS — Z48812 Encounter for surgical aftercare following surgery on the circulatory system: Secondary | ICD-10-CM | POA: Diagnosis not present

## 2020-02-17 DIAGNOSIS — Z8616 Personal history of COVID-19: Secondary | ICD-10-CM | POA: Diagnosis not present

## 2020-02-17 DIAGNOSIS — N183 Chronic kidney disease, stage 3 unspecified: Secondary | ICD-10-CM | POA: Diagnosis not present

## 2020-02-17 DIAGNOSIS — I70221 Atherosclerosis of native arteries of extremities with rest pain, right leg: Secondary | ICD-10-CM | POA: Diagnosis not present

## 2020-02-17 DIAGNOSIS — I2510 Atherosclerotic heart disease of native coronary artery without angina pectoris: Secondary | ICD-10-CM | POA: Diagnosis not present

## 2020-02-17 DIAGNOSIS — Z95820 Peripheral vascular angioplasty status with implants and grafts: Secondary | ICD-10-CM | POA: Diagnosis not present

## 2020-02-17 DIAGNOSIS — I480 Paroxysmal atrial fibrillation: Secondary | ICD-10-CM | POA: Diagnosis not present

## 2020-02-18 ENCOUNTER — Other Ambulatory Visit: Payer: Self-pay | Admitting: Physician Assistant

## 2020-02-18 MED ORDER — OXYCODONE HCL 5 MG PO TABS: 5.0000 mg | ORAL_TABLET | Freq: Three times a day (TID) | ORAL | 0 refills | Status: DC | PRN

## 2020-02-18 NOTE — Progress Notes (Signed)
  Patient was given a Prescription for Oxycodone 5 mg #20 post operatively at the time of discharge on 02/15/20. The patient reported that the pharmacy did not receive the prescription. It was confirmed with the pharmacy that the prescription was never received. I have sent a new prescription to the patients pharmacy- [facility] PA-C [facility] [phone]

## 2020-02-19 DIAGNOSIS — I70221 Atherosclerosis of native arteries of extremities with rest pain, right leg: Secondary | ICD-10-CM | POA: Diagnosis not present

## 2020-02-19 DIAGNOSIS — Z95820 Peripheral vascular angioplasty status with implants and grafts: Secondary | ICD-10-CM | POA: Diagnosis not present

## 2020-02-19 DIAGNOSIS — Z48812 Encounter for surgical aftercare following surgery on the circulatory system: Secondary | ICD-10-CM | POA: Diagnosis not present

## 2020-02-19 DIAGNOSIS — N183 Chronic kidney disease, stage 3 unspecified: Secondary | ICD-10-CM | POA: Diagnosis not present

## 2020-02-19 DIAGNOSIS — I2510 Atherosclerotic heart disease of native coronary artery without angina pectoris: Secondary | ICD-10-CM | POA: Diagnosis not present

## 2020-02-19 DIAGNOSIS — I480 Paroxysmal atrial fibrillation: Secondary | ICD-10-CM | POA: Diagnosis not present

## 2020-02-20 DIAGNOSIS — N183 Chronic kidney disease, stage 3 unspecified: Secondary | ICD-10-CM | POA: Diagnosis not present

## 2020-02-20 DIAGNOSIS — I70221 Atherosclerosis of native arteries of extremities with rest pain, right leg: Secondary | ICD-10-CM | POA: Diagnosis not present

## 2020-02-20 DIAGNOSIS — Z95820 Peripheral vascular angioplasty status with implants and grafts: Secondary | ICD-10-CM | POA: Diagnosis not present

## 2020-02-20 DIAGNOSIS — I480 Paroxysmal atrial fibrillation: Secondary | ICD-10-CM | POA: Diagnosis not present

## 2020-02-20 DIAGNOSIS — Z48812 Encounter for surgical aftercare following surgery on the circulatory system: Secondary | ICD-10-CM | POA: Diagnosis not present

## 2020-02-20 DIAGNOSIS — I2510 Atherosclerotic heart disease of native coronary artery without angina pectoris: Secondary | ICD-10-CM | POA: Diagnosis not present

## 2020-02-25 DIAGNOSIS — I70221 Atherosclerosis of native arteries of extremities with rest pain, right leg: Secondary | ICD-10-CM | POA: Diagnosis not present

## 2020-02-25 DIAGNOSIS — Z95820 Peripheral vascular angioplasty status with implants and grafts: Secondary | ICD-10-CM | POA: Diagnosis not present

## 2020-02-25 DIAGNOSIS — I480 Paroxysmal atrial fibrillation: Secondary | ICD-10-CM | POA: Diagnosis not present

## 2020-02-25 DIAGNOSIS — N183 Chronic kidney disease, stage 3 unspecified: Secondary | ICD-10-CM | POA: Diagnosis not present

## 2020-02-25 DIAGNOSIS — Z48812 Encounter for surgical aftercare following surgery on the circulatory system: Secondary | ICD-10-CM | POA: Diagnosis not present

## 2020-02-25 DIAGNOSIS — I2510 Atherosclerotic heart disease of native coronary artery without angina pectoris: Secondary | ICD-10-CM | POA: Diagnosis not present

## 2020-03-01 ENCOUNTER — Encounter: Payer: Self-pay | Admitting: Surgery

## 2020-03-01 ENCOUNTER — Other Ambulatory Visit: Payer: Self-pay

## 2020-03-01 ENCOUNTER — Ambulatory Visit (INDEPENDENT_AMBULATORY_CARE_PROVIDER_SITE_OTHER): Payer: Medicare Other | Admitting: Surgery

## 2020-03-01 VITALS — BP 152/61 | HR 61 | Temp 96.6°F | Resp 16 | Ht 66.0 in | Wt 152.0 lb

## 2020-03-01 DIAGNOSIS — I70213 Atherosclerosis of native arteries of extremities with intermittent claudication, bilateral legs: Secondary | ICD-10-CM

## 2020-03-01 MED ORDER — CEPHALEXIN 500 MG PO CAPS
500.0000 mg | ORAL_CAPSULE | Freq: Three times a day (TID) | ORAL | 0 refills | Status: DC
Start: 2020-03-01 — End: 2020-06-08

## 2020-03-01 NOTE — Progress Notes (Signed)
Patient name: Ryan Pittman MRN: [id number] DOB: [birth year] Sex: male  REASON FOR VISIT:     post op  HISTORY OF PRESENT ILLNESS:    Ryan Pittman is a 79 y.o. male, who is referred by Dr. 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  Patient has a history of a left femoral to below-knee popliteal artery bypass graft by Dr. Lucky Cowboy in [place].  He underwent angiography in October 2020 which revealed severe distal right common femoral disease extending into the superficial femoral artery.  He is referred for endarterectomy.  He states that his activity is significantly limited by cramping in his right calf.  His claudication distance is less than 100 feet.  He denies rest pain or ulcers.  On 02/13/2020 he underwent right iliofemoral endarterectomy with bovine pericardial patch angioplasty.  Patient had extensive near occlusive plaque throughout.  A long 8 cm bovine patch was placed.  He had a slight increase in his creatinine which kept him an extra day in the hospital.  He does complain of some burning pain and numbness along the knee and right thigh.  There is a slight open area at the superior aspect of his incision.  He also has mild edema  Patient is delayed his presentation secondary to being diagnosed with Covid.  He does have a significant smoking history but quit 8 years ago.  He is medically managed for hypertension.  He takes a statin for hypercholesterolemia.  Patient does suffer from coronary artery disease.  He is status post CABG in 2019.  CURRENT MEDICATIONS:    Current Outpatient Medications  Medication Sig Dispense Refill  . acetaminophen (TYLENOL) 500 MG tablet Take 1,000 mg by mouth every 6 (six) hours as needed for moderate pain.     [name] amiodarone (PACERONE) 200 MG tablet TAKE 1 TABLET BY MOUTH DAILY (Patient taking differently: Take 200 mg by mouth daily. ) 90 tablet 3  . amLODipine (NORVASC) 2.5 MG tablet Take 2.5 mg by mouth  daily.    . clopidogrel (PLAVIX) 75 MG tablet TAKE 1 TABLET BY MOUTH DAILY (Patient taking differently: Take 75 mg by mouth daily. ) 90 tablet 3  . Co-Enzyme Q-10 100 MG CAPS Take 100 mg by mouth daily.     . Evolocumab (REPATHA SURECLICK) 270 MG/ML SOAJ Inject 140 mg into the skin every 14 (fourteen) days. 2 pen 12  . levothyroxine (SYNTHROID) 75 MCG tablet 1 tablet daily except on Thursdays take 1.5 tabs. (Patient taking differently: Take 75 mcg by mouth See admin instructions. Take 75 mcg by mouth before breakfast daily, on Thursday take 150 mcg) 100 tablet 1  . losartan (COZAAR) 50 MG tablet Take 50 mg by mouth daily.    . metoprolol tartrate (LOPRESSOR) 25 MG tablet TAKE 1/2 TABLET TWICE DAILY (Patient taking differently: Take 12.5 mg by mouth 2 (two) times daily. ) 30 tablet 9  . Multiple Vitamins-Minerals (MULTIVITAMIN WITH MINERALS) tablet Take 1 tablet by mouth daily. 120 tablet 2  . Omega-3 Fatty Acids (FISH OIL OMEGA-3 PO) Take 1 capsule by mouth daily.     . pramipexole (MIRAPEX) 1 MG tablet Take 1 mg by mouth daily as needed (restless leg).     . pravastatin (PRAVACHOL) 40 MG tablet TAKE ONE TABLET BY MOUTH EVERY DAY (Patient taking differently: Take 40 mg by mouth daily. ) 90 tablet 3  . rOPINIRole (REQUIP) 0.25 MG tablet Take 1 tablet (0.25 mg total) by mouth at bedtime. (Patient taking differently: Take  0.25 mg by mouth at bedtime as needed (restless leg). ) 30 tablet 0  . tamsulosin (FLOMAX) 0.4 MG CAPS capsule Take 1 capsule (0.4 mg total) by mouth daily. 90 capsule 3  . traZODone (DESYREL) 100 MG tablet TAKE 1/2-1 TABLET BY MOUTH AT BEDTIME ASNEEDED FOR SLEEP (Patient taking differently: Take 50-100 mg by mouth at bedtime as needed for sleep. ) 30 tablet 5  . vitamin C (ASCORBIC ACID) 500 MG tablet Take 500 mg by mouth daily.    [name] oxyCODONE (ROXICODONE) 5 MG immediate release tablet Take 1 tablet (5 mg total) by mouth every 8 (eight) hours as needed for severe pain. (Patient not  taking: Reported on 03/01/2020) 20 tablet 0  . pantoprazole (PROTONIX) 40 MG tablet Take 1 tablet (40 mg total) by mouth daily as needed (for heartburn). 30 tablet 1   No current facility-administered medications for this visit.    REVIEW OF SYSTEMS:   [X]  denotes positive finding, [ ]  denotes negative finding Cardiac  Comments:  Chest pain or chest pressure:    Shortness of breath upon exertion:    Short of breath when lying flat:    Irregular heart rhythm:    Constitutional    Fever or chills:      PHYSICAL EXAM:   Vitals:   03/01/20 1051  BP: (!) 152/61  Pulse: 61  Resp: 16  Temp: (!) 96.6 F (35.9 C)  TempSrc: Temporal  SpO2: 96%  Weight: 152 lb (68.9 kg)  Height: 5\' 6"  (1.676 m)    GENERAL: The patient is a well-nourished male, in no acute distress. The vital signs are documented above. CARDIOVASCULAR: There is a regular rate and rhythm. PULMONARY: Non-labored respirations The superior aspect of the incision has separated for approximately 1-2 mm.  I touched this area with silver nitrate.  There is a small amount of surrounding erythema.  The rest of the incision remains intact without drainage or erythema.  STUDIES:   None   MEDICAL ISSUES:   Doing very well status post right femoral endarterectomy.  I used silver nitrate to cauterize the superior aspect of the wound.  I am placing him on Keflex for 1 week for the erythema around the superior aspect of the incision which looks more like skin irritation rather than infection.  I have encouraged him to return to normal activity.  I have him scheduled for follow-up in 3 months with ABIs and duplex of the surgical site.  After that, I will transition his care back to Dr. Luiz Ochoa, MD, [facility] [phone] Pager [phone]

## 2020-03-02 DIAGNOSIS — Z95820 Peripheral vascular angioplasty status with implants and grafts: Secondary | ICD-10-CM | POA: Diagnosis not present

## 2020-03-02 DIAGNOSIS — N183 Chronic kidney disease, stage 3 unspecified: Secondary | ICD-10-CM | POA: Diagnosis not present

## 2020-03-02 DIAGNOSIS — I2510 Atherosclerotic heart disease of native coronary artery without angina pectoris: Secondary | ICD-10-CM | POA: Diagnosis not present

## 2020-03-02 DIAGNOSIS — Z48812 Encounter for surgical aftercare following surgery on the circulatory system: Secondary | ICD-10-CM | POA: Diagnosis not present

## 2020-03-02 DIAGNOSIS — I480 Paroxysmal atrial fibrillation: Secondary | ICD-10-CM | POA: Diagnosis not present

## 2020-03-02 DIAGNOSIS — I70221 Atherosclerosis of native arteries of extremities with rest pain, right leg: Secondary | ICD-10-CM | POA: Diagnosis not present

## 2020-03-02 NOTE — Discharge Summary (Signed)
[facility] Discharge Summary   Patient ID:  Ryan Pittman MRN: [id number] DOB/AGE: 1941/07/20 79 y.o.  Admit date: 02/13/2020 Discharge date: 02/18/2020 Date of Surgery: 02/13/2020 Surgeon: Surgeon(s): Serafina Mitchell, MD  Admission Diagnosis: PAD (peripheral artery disease) ([place]) [I73.9]  Discharge Diagnoses:  PAD (peripheral artery disease) ([place]) [I73.9]  Secondary Diagnoses: Past Medical History:  Diagnosis Date   Anemia    due to GIB, s/p transfusion   Anginal pain ([place]) 07/10/2018   Arthritis    back pain, much worse after consecutive golf rounds   Cancer [facility])    thyroid   Chronic kidney disease    Colon polyps    Coronary artery disease    COVID-19    Dyspnea    walking up a hill   Dysrhythmia 2020   GERD (gastroesophageal reflux disease)    Hypercalcemia    h/o, resolved as of 2012, prev due to high amount of calcium intake   Hyperlipidemia    Hypertension    Hypothyroidism    IgG gammopathy    stable as of 2012 per [facility]   Pneumonia    as a child   PVD (peripheral vascular disease) ([place])    L leg bypass, R leg stented    Procedure(s): RIGHT ENDARTERECTOMY FEMORAL WITH BOVINE PATCH  Discharged Condition: good  HPI:  Ryan Pittman is a 79 y.o. male, who is referred by Dr. 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  Patient has a history of a left femoral to below-knee popliteal artery bypass graft by Dr. Lucky Cowboy in [place].  He underwent angiography in October 2020 which revealed severe distal right common femoral disease extending into the superficial femoral artery.  He is referred for endarterectomy.  He states that his activity is significantly limited by cramping in his right calf.  His claudication distance is less than 100 feet.  He denies rest pain or ulcers.  Patient is delayed his presentation secondary to being diagnosed with Covid.  He does have a significant smoking history but quit 8 years ago.   He is medically managed for hypertension.  He takes a statin for hypercholesterolemia.  Patient does suffer from coronary artery disease.  He is status post CABG in 2019.  Review of angiogram which shows severe right common femoral and proximal superficial femoral artery stenosis.  He was scheduled for  right femoral endarterectomy.   Hospital Course:  Ryan Pittman is a 79 y.o. male is S/P Right Procedure(s): RIGHT ENDARTERECTOMY FEMORAL WITH BOVINE PATCH  Post op day 1 he had bloody drainage at the right groin incision.  He was placed on temporary bedrest.  Post op day 2 he ambulated without recurrent drainage at the incision.  Right DP/PT/peroneal doppler signals.  Mild ecchymosis at right groin incision.  Stable for discharge f/u in 2-3 weeks.     Significant Diagnostic Studies: CBC Lab Results  Component Value Date   WBC 17.0 (H) 02/14/2020   HGB 12.3 (L) 02/14/2020   HCT 37.2 (L) 02/14/2020   MCV 93.2 02/14/2020   PLT 190 02/14/2020    BMET    Component Value Date/Time   NA 139 02/15/2020 1059   NA 141 06/19/2019 1207   K 4.1 02/15/2020 1059   CL 105 02/15/2020 1059   CO2 23 02/15/2020 1059   GLUCOSE 130 (H) 02/15/2020 1059   BUN 19 02/15/2020 1059   BUN 18 06/19/2019 1207   CREATININE 1.23 02/15/2020 1059   CREATININE 0.93 09/04/2019  1154   CALCIUM 9.0 02/15/2020 1059   GFRNONAA 55 (L) 02/15/2020 1059   GFRAA >60 02/15/2020 1059   COAG Lab Results  Component Value Date   INR 0.9 02/03/2020   INR 1.27 07/11/2018   INR 0.95 07/10/2018     Disposition:  Discharge to :Home Discharge Instructions    Call MD for:  redness, tenderness, or signs of infection (pain, swelling, bleeding, redness, odor or green/yellow discharge around incision site)   Complete by: As directed    Call MD for:  redness, tenderness, or signs of infection (pain, swelling, bleeding, redness, odor or green/yellow discharge around incision site)   Complete by: As directed    Call MD for:   severe or increased pain, loss or decreased feeling  in affected limb(s)   Complete by: As directed    Call MD for:  severe or increased pain, loss or decreased feeling  in affected limb(s)   Complete by: As directed    Call MD for:  temperature >100.5   Complete by: As directed    Call MD for:  temperature >100.5   Complete by: As directed    Resume previous diet   Complete by: As directed    Resume previous diet   Complete by: As directed      Allergies as of 02/15/2020      Reactions   Penicillins Itching, Other (See Comments)   PATIENT HAS HAD A PCN REACTION WITH IMMEDIATE RASH, FACIAL/TONGUE/THROAT SWELLING, SOB, OR LIGHTHEADEDNESS WITH HYPOTENSION:  #  #  YES  #  #  Has patient had a PCN reaction causing severe rash involving mucus membranes or skin necrosis: No Has patient had a PCN reaction that required hospitalization: No Has patient had a PCN reaction occurring within the last 10 years: No If all of the above answers are "NO", then may proceed with Cephalosporin use.   Ace Inhibitors Cough   Aspirin Other (See Comments)   H/o GI bleed   Celebrex [celecoxib] Other (See Comments)   GI bleed   Lipitor [atorvastatin] Other (See Comments)   myalgias      Medication List    TAKE these medications   acetaminophen 500 MG tablet Commonly known as: TYLENOL Take 1,000 mg by mouth every 6 (six) hours as needed for moderate pain. Notes to patient: As needed for pain   amiodarone 200 MG tablet Commonly known as: PACERONE TAKE 1 TABLET BY MOUTH DAILY Notes to patient: Tomorrow morning 6/14   amLODipine 2.5 MG tablet Commonly known as: NORVASC Take 2.5 mg by mouth daily. Notes to patient: Tomorrow morning 6/14   clopidogrel 75 MG tablet Commonly known as: PLAVIX TAKE 1 TABLET BY MOUTH DAILY Notes to patient: Tomorrow morning 6/14   Co-Enzyme Q-10 100 MG Caps Take 100 mg by mouth daily. Notes to patient: Tomorrow morning 6/14   FISH OIL OMEGA-3 PO Take 1 capsule by  mouth daily. Notes to patient: Tomorrow morning 6/14   levothyroxine 75 MCG tablet Commonly known as: SYNTHROID 1 tablet daily except on Thursdays take 1.5 tabs. What changed:   how much to take  how to take this  when to take this  additional instructions Notes to patient: Tomorrow morning 6/14   losartan 50 MG tablet Commonly known as: COZAAR Take 50 mg by mouth daily. Notes to patient: Tomorrow morning 6/14   metoprolol tartrate 25 MG tablet Commonly known as: LOPRESSOR TAKE 1/2 TABLET TWICE DAILY Notes to patient: This evening 6/13  multivitamin with minerals tablet Take 1 tablet by mouth daily. Notes to patient: Tomorrow morning 6/14   pantoprazole 40 MG tablet Commonly known as: PROTONIX Take 1 tablet (40 mg total) by mouth daily as needed (for heartburn). Notes to patient: As needed   pramipexole 1 MG tablet Commonly known as: MIRAPEX Take 1 mg by mouth daily as needed (restless leg). Notes to patient: As needed   pravastatin 40 MG tablet Commonly known as: PRAVACHOL TAKE ONE TABLET BY MOUTH EVERY DAY Notes to patient: This evening 9/15   Repatha SureClick 056 MG/ML Soaj Generic drug: Evolocumab Inject 140 mg into the skin every 14 (fourteen) days. Notes to patient: Take as you were at home   rOPINIRole 0.25 MG tablet Commonly known as: REQUIP Take 1 tablet (0.25 mg total) by mouth at bedtime. What changed:   when to take this  reasons to take this Notes to patient: This evening 6/13   tamsulosin 0.4 MG Caps capsule Commonly known as: FLOMAX Take 1 capsule (0.4 mg total) by mouth daily. Notes to patient: Tomorrow morning 6/14   traZODone 100 MG tablet Commonly known as: DESYREL TAKE 1/2-1 TABLET BY MOUTH AT BEDTIME ASNEEDED FOR SLEEP What changed: See the new instructions. Notes to patient: As needed   vitamin C 500 MG tablet Commonly known as: ASCORBIC ACID Take 500 mg by mouth daily. Notes to patient: Tomorrow morning 6/14       Verbal and written Discharge instructions given to the patient. Wound care per Discharge AVS  Follow-up Information    Serafina Mitchell, MD Follow up in 2 week(s).   Specialties: Vascular Surgery, Cardiology Contact information: [address] [place] [address] [phone]               Signed: Roxy Horseman 03/02/2020, 9:55 AM

## 2020-03-03 ENCOUNTER — Other Ambulatory Visit: Payer: Self-pay | Admitting: *Deleted

## 2020-03-03 DIAGNOSIS — I70213 Atherosclerosis of native arteries of extremities with intermittent claudication, bilateral legs: Secondary | ICD-10-CM

## 2020-03-03 DIAGNOSIS — I739 Peripheral vascular disease, unspecified: Secondary | ICD-10-CM

## 2020-03-09 ENCOUNTER — Other Ambulatory Visit: Payer: Self-pay | Admitting: Cardiovascular Disease

## 2020-03-09 ENCOUNTER — Telehealth: Payer: Self-pay

## 2020-03-09 DIAGNOSIS — I70221 Atherosclerosis of native arteries of extremities with rest pain, right leg: Secondary | ICD-10-CM | POA: Diagnosis not present

## 2020-03-09 DIAGNOSIS — Z95820 Peripheral vascular angioplasty status with implants and grafts: Secondary | ICD-10-CM | POA: Diagnosis not present

## 2020-03-09 DIAGNOSIS — Z48812 Encounter for surgical aftercare following surgery on the circulatory system: Secondary | ICD-10-CM | POA: Diagnosis not present

## 2020-03-09 DIAGNOSIS — I2510 Atherosclerotic heart disease of native coronary artery without angina pectoris: Secondary | ICD-10-CM | POA: Diagnosis not present

## 2020-03-09 DIAGNOSIS — N183 Chronic kidney disease, stage 3 unspecified: Secondary | ICD-10-CM | POA: Diagnosis not present

## 2020-03-09 DIAGNOSIS — I480 Paroxysmal atrial fibrillation: Secondary | ICD-10-CM | POA: Diagnosis not present

## 2020-03-09 NOTE — Telephone Encounter (Signed)
Rt groin incision has opened up more at the top more than when last checked on 6/28, with continued small amount of yellowish-brown drainage and increased redness at edges. Pt completed Keflex antibiotics yesterday that was prescribed on 6/28. HHN visited today and advised pt to contact office. Pt scheduled for [facility] clinic for wound check on tomorrow. Voiced understanding.

## 2020-03-09 NOTE — Telephone Encounter (Signed)
A nurse from [facility] called today regarding this patient.  He called them last Friday complaining of pain and a "new hole in his wound"  She advised him to go to the ED.  He did not go since he "felt better".  He says his wound is still red and he has been on antibiotics since last Monday.    She will follow up with patient today and go out to look at his wound.  She will report her findings if he needs new orders.  Thurston Hole., LPN

## 2020-03-10 ENCOUNTER — Other Ambulatory Visit: Payer: Self-pay | Admitting: Family Medicine

## 2020-03-10 ENCOUNTER — Ambulatory Visit (INDEPENDENT_AMBULATORY_CARE_PROVIDER_SITE_OTHER): Payer: Self-pay | Admitting: Physician Assistant

## 2020-03-10 ENCOUNTER — Other Ambulatory Visit: Payer: Self-pay

## 2020-03-10 VITALS — BP 152/57 | HR 69 | Temp 97.6°F | Resp 20 | Ht 66.0 in | Wt 155.6 lb

## 2020-03-10 DIAGNOSIS — T8131XS Disruption of external operation (surgical) wound, not elsewhere classified, sequela: Secondary | ICD-10-CM

## 2020-03-10 DIAGNOSIS — I739 Peripheral vascular disease, unspecified: Secondary | ICD-10-CM

## 2020-03-10 NOTE — Progress Notes (Signed)
POST OPERATIVE OFFICE NOTE    CC:  F/u for surgery  HPI:  This is a 79 y.o. male who is s/p right iliofemoral endarterectomy with bovine pericardial patch angioplasty on 02/13/20 by Dr. Trula Slade for severe right leg claudication. He had angiography prior to his surgery which showed near occlusive plaque in the common femora artery as well as the proximal SFA and profundafemoral artery. He was last seen on 6/28 by Dr. Trula Slade. At that time he did have a slight open area at the superior aspect of his incision with some edema. Dr. Trula Slade treated the area with a little silver nitrate to cauterize the superior wound bed and gave him a prescription for keflex for 7 days  He returns today with concerns of the open area along the incision line. Feel that the opening is a litter larger. Minimal to no drainage. He finished his course of antibiotics on Monday. Some improvement of redness after taking antibiotics. Worried about a little redness of entire right leg and some swelling. States that swelling is typical thing in bilateral legs even prior to surgery. He intermittently elevates. The rest of leg otherwise feels fine. Does have some residual right medial thigh numbness which is improving. Also some soreness/throbbing in right thigh which is newer but says it is not bothersome enough to take anything for it. He does not have any fever or chills  Allergies  Allergen Reactions  . Penicillins Itching and Other (See Comments)    PATIENT HAS HAD A PCN REACTION WITH IMMEDIATE RASH, FACIAL/TONGUE/THROAT SWELLING, SOB, OR LIGHTHEADEDNESS WITH HYPOTENSION:  #  #  YES  #  #  Has patient had a PCN reaction causing severe rash involving mucus membranes or skin necrosis: No Has patient had a PCN reaction that required hospitalization: No Has patient had a PCN reaction occurring within the last 10 years: No If all of the above answers are "NO", then may proceed with Cephalosporin use.   . Ace Inhibitors Cough  .  Aspirin Other (See Comments)    H/o GI bleed  . Celebrex [Celecoxib] Other (See Comments)    GI bleed  . Lipitor [Atorvastatin] Other (See Comments)    myalgias    Current Outpatient Medications  Medication Sig Dispense Refill  . acetaminophen (TYLENOL) 500 MG tablet Take 1,000 mg by mouth every 6 (six) hours as needed for moderate pain.     [name] amiodarone (PACERONE) 200 MG tablet TAKE 1 TABLET BY MOUTH DAILY (Patient taking differently: Take 200 mg by mouth daily. ) 90 tablet 3  . amLODipine (NORVASC) 2.5 MG tablet Take 2.5 mg by mouth daily.    . cephALEXin (KEFLEX) 500 MG capsule Take 1 capsule (500 mg total) by mouth 3 (three) times daily. 21 capsule 0  . clopidogrel (PLAVIX) 75 MG tablet TAKE 1 TABLET BY MOUTH DAILY (Patient taking differently: Take 75 mg by mouth daily. ) 90 tablet 3  . Co-Enzyme Q-10 100 MG CAPS Take 100 mg by mouth daily.     [name] levothyroxine (SYNTHROID) 75 MCG tablet 1 tablet daily except on Thursdays take 1.5 tabs. (Patient taking differently: Take 75 mcg by mouth See admin instructions. Take 75 mcg by mouth before breakfast daily, on Thursday take 150 mcg) 100 tablet 1  . losartan (COZAAR) 50 MG tablet Take 50 mg by mouth daily.    . metoprolol tartrate (LOPRESSOR) 25 MG tablet TAKE 1/2 TABLET TWICE DAILY (Patient taking differently: Take 12.5 mg by mouth 2 (two) times daily. )  30 tablet 9  . Multiple Vitamins-Minerals (MULTIVITAMIN WITH MINERALS) tablet Take 1 tablet by mouth daily. 120 tablet 2  . Omega-3 Fatty Acids (FISH OIL OMEGA-3 PO) Take 1 capsule by mouth daily.     [name] oxyCODONE (ROXICODONE) 5 MG immediate release tablet Take 1 tablet (5 mg total) by mouth every 8 (eight) hours as needed for severe pain. 20 tablet 0  . pramipexole (MIRAPEX) 1 MG tablet Take 1 mg by mouth daily as needed (restless leg).     . pravastatin (PRAVACHOL) 40 MG tablet TAKE ONE TABLET BY MOUTH EVERY DAY (Patient taking differently: Take 40 mg by mouth daily. ) 90 tablet 3  . REPATHA  SURECLICK 174 MG/ML SOAJ INJECT 140MG  EVERY 14 DAYS 2 mL 5  . rOPINIRole (REQUIP) 0.25 MG tablet Take 1 tablet (0.25 mg total) by mouth at bedtime. (Patient taking differently: Take 0.25 mg by mouth at bedtime as needed (restless leg). ) 30 tablet 0  . tamsulosin (FLOMAX) 0.4 MG CAPS capsule Take 1 capsule (0.4 mg total) by mouth daily. 90 capsule 3  . traZODone (DESYREL) 100 MG tablet TAKE 1/2-1 TABLET BY MOUTH AT BEDTIME ASNEEDED FOR SLEEP (Patient taking differently: Take 50-100 mg by mouth at bedtime as needed for sleep. ) 30 tablet 5  . vitamin C (ASCORBIC ACID) 500 MG tablet Take 500 mg by mouth daily.    . pantoprazole (PROTONIX) 40 MG tablet Take 1 tablet (40 mg total) by mouth daily as needed (for heartburn). 30 tablet 1   No current facility-administered medications for this visit.     ROS:  See HPI  Physical Exam:  Vitals:   03/10/20 0919  BP: (!) 152/57  Pulse: 69  Resp: 20  Temp: 97.6 F (36.4 C)  TempSrc: Temporal  SpO2: 97%  Weight: 155 lb 9.6 oz (70.6 kg)  Height: 5\' 6"  (1.676 m)   General: well nourished, well appearing, not in any distress Incision:  The superior aspect of the incision has a very small separated area of 1-2 mm. This is essentially unchanged from prior visit. Applied silver nitrate to this area. There is no erythema present. I used a cotton tipped applicator to probe the opening to make sure there was no tracking. There is no drainage on compression. The remainder of the right groin incision is healing well    Extremities: 2+  Palpable femoral pulses, 2 + DP pulses bilaterally. Bilateral feet warm Neuro: alert and oriented Abdomen:  Soft, non tender, non distended  Assessment/Plan:  This is a 79 y.o. male who is s/p right iliofemoral endarterectomy with bovine pericardial patch angioplasty. Wound is essentially unchanged from his last visit. He completed course of antibiotics. I do not think that he needs to be on a longer course. Does not appear  to have any infection. I used silver nitrate again on this area of wound. He can continue normal activity. Observe area for increased or worrisome signs of infection. - advised them to wash area with unscented soap and water and make sure to keep area dry - I will have him follow up in 2 weeks for another wound check to make sure it is healing as anticipated - He will keep his 3 month follow up non invasive studies with Dr. Trula Slade in September  Karoline Caldwell, PA-C [facility] [phone]  Clinic MD:  Dr. Scot Dock

## 2020-03-10 NOTE — Telephone Encounter (Signed)
Last office visit  Please confirm directions which shows take one daily except on Thursday take 1.5 is correct. On one note it showed patient reported taking one daily with 150 mcg on Thursday.

## 2020-03-11 DIAGNOSIS — I2510 Atherosclerotic heart disease of native coronary artery without angina pectoris: Secondary | ICD-10-CM | POA: Diagnosis not present

## 2020-03-11 DIAGNOSIS — I70221 Atherosclerosis of native arteries of extremities with rest pain, right leg: Secondary | ICD-10-CM | POA: Diagnosis not present

## 2020-03-11 DIAGNOSIS — I480 Paroxysmal atrial fibrillation: Secondary | ICD-10-CM | POA: Diagnosis not present

## 2020-03-11 DIAGNOSIS — Z48812 Encounter for surgical aftercare following surgery on the circulatory system: Secondary | ICD-10-CM | POA: Diagnosis not present

## 2020-03-11 DIAGNOSIS — Z95820 Peripheral vascular angioplasty status with implants and grafts: Secondary | ICD-10-CM | POA: Diagnosis not present

## 2020-03-11 DIAGNOSIS — N183 Chronic kidney disease, stage 3 unspecified: Secondary | ICD-10-CM | POA: Diagnosis not present

## 2020-03-11 NOTE — Telephone Encounter (Signed)
I sent the rx in the meantime.   Please verify with patient about his dosing.  I want him to continue what he has been doing most recently since his most recent TSH was normal.  The most recent note I saw listed 1 tablet a day except for 1.5 tablets on Thursdays.  Thanks.

## 2020-03-12 ENCOUNTER — Telehealth: Payer: Self-pay | Admitting: *Deleted

## 2020-03-12 NOTE — Telephone Encounter (Signed)
Wife called stating that wound was draining somewhat yellowish, greenish liquid. Denies fever or odor. Patient stated that he does not feel there is an increased amount of drainage.  Spoke to Dr Donzetta Matters and he encouraged patient to keep area dry and observe for changes. Patient's wife voiced understanding of the instructions.

## 2020-03-18 DIAGNOSIS — Z95820 Peripheral vascular angioplasty status with implants and grafts: Secondary | ICD-10-CM | POA: Diagnosis not present

## 2020-03-18 DIAGNOSIS — I2510 Atherosclerotic heart disease of native coronary artery without angina pectoris: Secondary | ICD-10-CM | POA: Diagnosis not present

## 2020-03-18 DIAGNOSIS — I70221 Atherosclerosis of native arteries of extremities with rest pain, right leg: Secondary | ICD-10-CM | POA: Diagnosis not present

## 2020-03-18 DIAGNOSIS — I480 Paroxysmal atrial fibrillation: Secondary | ICD-10-CM | POA: Diagnosis not present

## 2020-03-18 DIAGNOSIS — Z48812 Encounter for surgical aftercare following surgery on the circulatory system: Secondary | ICD-10-CM | POA: Diagnosis not present

## 2020-03-18 DIAGNOSIS — Z8616 Personal history of COVID-19: Secondary | ICD-10-CM | POA: Diagnosis not present

## 2020-03-18 DIAGNOSIS — N183 Chronic kidney disease, stage 3 unspecified: Secondary | ICD-10-CM | POA: Diagnosis not present

## 2020-03-24 ENCOUNTER — Ambulatory Visit (INDEPENDENT_AMBULATORY_CARE_PROVIDER_SITE_OTHER): Payer: Self-pay | Admitting: Physician Assistant

## 2020-03-24 ENCOUNTER — Other Ambulatory Visit: Payer: Self-pay

## 2020-03-24 VITALS — BP 154/59 | HR 68 | Temp 97.5°F | Resp 20 | Ht 66.0 in | Wt 159.0 lb

## 2020-03-24 DIAGNOSIS — I739 Peripheral vascular disease, unspecified: Secondary | ICD-10-CM

## 2020-03-24 NOTE — Progress Notes (Signed)
POST OPERATIVE OFFICE NOTE    CC:  F/u for surgery  HPI:  This is a 79 y.o. male who is s/p right iliofemoral endarterectomy with bovine pericardial patch angioplasty on 02/13/20 by Dr. Trula Slade for severe right leg claudication.  He was last seen on 03/10/2020 at which time, he still had concerns about his   Pt returns today for follow up and accompanied by his wife.   They both states that his right groin wound has improved as well as the redness.  There is no further drainage from the right groin.  His right leg claudication sx have significantly improved.  He is still having some swelling in the right leg.  Not elevating as much as he could.  States he has some redness in the right lower leg that had gotten better and was a little red today. He has not had any fevers.  He has some soreness in the right medial thigh that has gotten better as well.   Allergies  Allergen Reactions  . Penicillins Itching and Other (See Comments)    PATIENT HAS HAD A PCN REACTION WITH IMMEDIATE RASH, FACIAL/TONGUE/THROAT SWELLING, SOB, OR LIGHTHEADEDNESS WITH HYPOTENSION:  #  #  YES  #  #  Has patient had a PCN reaction causing severe rash involving mucus membranes or skin necrosis: No Has patient had a PCN reaction that required hospitalization: No Has patient had a PCN reaction occurring within the last 10 years: No If all of the above answers are "NO", then may proceed with Cephalosporin use.   . Ace Inhibitors Cough  . Aspirin Other (See Comments)    H/o GI bleed  . Celebrex [Celecoxib] Other (See Comments)    GI bleed  . Lipitor [Atorvastatin] Other (See Comments)    myalgias    Current Outpatient Medications  Medication Sig Dispense Refill  . acetaminophen (TYLENOL) 500 MG tablet Take 1,000 mg by mouth every 6 (six) hours as needed for moderate pain.     [name] amiodarone (PACERONE) 200 MG tablet TAKE 1 TABLET BY MOUTH DAILY (Patient taking differently: Take 200 mg by mouth daily. ) 90 tablet 3  .  amLODipine (NORVASC) 2.5 MG tablet Take 2.5 mg by mouth daily.    . cephALEXin (KEFLEX) 500 MG capsule Take 1 capsule (500 mg total) by mouth 3 (three) times daily. 21 capsule 0  . clopidogrel (PLAVIX) 75 MG tablet TAKE 1 TABLET BY MOUTH DAILY (Patient taking differently: Take 75 mg by mouth daily. ) 90 tablet 3  . Co-Enzyme Q-10 100 MG CAPS Take 100 mg by mouth daily.     [name] levothyroxine (SYNTHROID) 75 MCG tablet TAKE 1 TABLET BY MOUTH DAILY EXCEPT ON THURSDAYS TAKE 1.5 TABLETS 100 tablet 3  . losartan (COZAAR) 50 MG tablet Take 50 mg by mouth daily.    . metoprolol tartrate (LOPRESSOR) 25 MG tablet TAKE 1/2 TABLET TWICE DAILY (Patient taking differently: Take 12.5 mg by mouth 2 (two) times daily. ) 30 tablet 9  . Multiple Vitamins-Minerals (MULTIVITAMIN WITH MINERALS) tablet Take 1 tablet by mouth daily. 120 tablet 2  . Omega-3 Fatty Acids (FISH OIL OMEGA-3 PO) Take 1 capsule by mouth daily.     [name] oxyCODONE (ROXICODONE) 5 MG immediate release tablet Take 1 tablet (5 mg total) by mouth every 8 (eight) hours as needed for severe pain. 20 tablet 0  . pantoprazole (PROTONIX) 40 MG tablet Take 1 tablet (40 mg total) by mouth daily as needed (for heartburn). 30 tablet  1  . pramipexole (MIRAPEX) 1 MG tablet Take 1 mg by mouth daily as needed (restless leg).     . pravastatin (PRAVACHOL) 40 MG tablet TAKE ONE TABLET BY MOUTH EVERY DAY (Patient taking differently: Take 40 mg by mouth daily. ) 90 tablet 3  . REPATHA SURECLICK 947 MG/ML SOAJ INJECT 140MG  EVERY 14 DAYS 2 mL 5  . rOPINIRole (REQUIP) 0.25 MG tablet Take 1 tablet (0.25 mg total) by mouth at bedtime. (Patient taking differently: Take 0.25 mg by mouth at bedtime as needed (restless leg). ) 30 tablet 0  . tamsulosin (FLOMAX) 0.4 MG CAPS capsule Take 1 capsule (0.4 mg total) by mouth daily. 90 capsule 3  . traZODone (DESYREL) 100 MG tablet TAKE 1/2-1 TABLET BY MOUTH AT BEDTIME ASNEEDED FOR SLEEP (Patient taking differently: Take 50-100 mg by mouth at  bedtime as needed for sleep. ) 30 tablet 5  . vitamin C (ASCORBIC ACID) 500 MG tablet Take 500 mg by mouth daily.     No current facility-administered medications for this visit.     ROS:  See HPI  Physical Exam:  Today's Vitals   03/24/20 0919  BP: (!) 154/59  Pulse: 68  Resp: 20  Temp: (!) 97.5 F (36.4 C)  TempSrc: Temporal  SpO2: 97%  Weight: 159 lb (72.1 kg)  Height: 5\' 6"  (1.676 m)  PainSc: 0-No pain   Body mass index is 25.66 kg/m.   Incision:  Right groin incision has healed nicely.  There is a small open area proximally that is improving.   Erythema improved that is compared with picture wife had on her phone from 2 weeks ago.      Extremities:  Palpable left DP pulse; right brisk biphasic AT, monophasic PT and peroneal;  Mild swelling RLE; mild redness RLE   Assessment/Plan:  This is a 79 y.o. male who is s/p: right iliofemoral endarterectomy with bovine pericardial patch angioplasty on 02/13/20 by Dr. Trula Slade for severe right leg claudication  -incision continues to heal.  Erythema improved and there is no drainage from the wound.   Instructed pt to continue dry dressing as he has been doing.  Continue soap and water daily and he can use band-aid for this.  -claudication sx significantly improved.  He does have some swelling of the right leg and advised him to elevate his leg when sitting.  There is some mild redness of the right leg around the ankle.  This is not angry or appear to be cellulitis in appearance.  There is no tenderness of the lower leg and no fevers -he will keep his appt in September with Dr. Trula Slade.  He will call sooner if he has any issues.     Leontine Locket, [facility] [phone]  Clinic MD:  Scot Dock

## 2020-04-01 DIAGNOSIS — I70221 Atherosclerosis of native arteries of extremities with rest pain, right leg: Secondary | ICD-10-CM | POA: Diagnosis not present

## 2020-04-01 DIAGNOSIS — N183 Chronic kidney disease, stage 3 unspecified: Secondary | ICD-10-CM | POA: Diagnosis not present

## 2020-04-01 DIAGNOSIS — Z95820 Peripheral vascular angioplasty status with implants and grafts: Secondary | ICD-10-CM | POA: Diagnosis not present

## 2020-04-01 DIAGNOSIS — I480 Paroxysmal atrial fibrillation: Secondary | ICD-10-CM | POA: Diagnosis not present

## 2020-04-01 DIAGNOSIS — Z48812 Encounter for surgical aftercare following surgery on the circulatory system: Secondary | ICD-10-CM | POA: Diagnosis not present

## 2020-04-01 DIAGNOSIS — I2510 Atherosclerotic heart disease of native coronary artery without angina pectoris: Secondary | ICD-10-CM | POA: Diagnosis not present

## 2020-04-07 ENCOUNTER — Other Ambulatory Visit: Payer: Medicare Other

## 2020-04-08 ENCOUNTER — Other Ambulatory Visit: Payer: Self-pay

## 2020-04-08 ENCOUNTER — Other Ambulatory Visit (INDEPENDENT_AMBULATORY_CARE_PROVIDER_SITE_OTHER): Payer: Medicare Other

## 2020-04-08 DIAGNOSIS — E039 Hypothyroidism, unspecified: Secondary | ICD-10-CM

## 2020-04-08 DIAGNOSIS — Z862 Personal history of diseases of the blood and blood-forming organs and certain disorders involving the immune mechanism: Secondary | ICD-10-CM | POA: Diagnosis not present

## 2020-04-08 LAB — TSH: TSH: 4.04 u[IU]/mL (ref 0.35–4.50)

## 2020-04-08 LAB — BASIC METABOLIC PANEL
BUN: 22 mg/dL (ref 6–23)
CO2: 25 mEq/L (ref 19–32)
Calcium: 9.3 mg/dL (ref 8.4–10.5)
Chloride: 104 mEq/L (ref 96–112)
Creatinine, Ser: 1.2 mg/dL (ref 0.40–1.50)
GFR: 58.1 mL/min — ABNORMAL LOW (ref >60.00)
Glucose, Bld: 96 mg/dL (ref 70–99)
Potassium: 4.1 mEq/L (ref 3.5–5.1)
Sodium: 138 mEq/L (ref 135–145)

## 2020-04-08 LAB — CBC WITH DIFFERENTIAL/PLATELET
Basophils Absolute: 0.1 10*3/uL (ref 0.0–0.1)
Basophils Relative: 0.7 % (ref 0.0–3.0)
Eosinophils Absolute: 0.2 10*3/uL (ref 0.0–0.7)
Eosinophils Relative: 1.8 % (ref 0.0–5.0)
HCT: 37.8 % — ABNORMAL LOW (ref 39.0–52.0)
Hemoglobin: 12.9 g/dL — ABNORMAL LOW (ref 13.0–17.0)
Lymphocytes Relative: 21.6 % (ref 12.0–46.0)
Lymphs Abs: 2.7 10*3/uL (ref 0.7–4.0)
MCHC: 34.1 g/dL (ref 30.0–36.0)
MCV: 95 fl (ref 78.0–100.0)
Monocytes Absolute: 0.9 10*3/uL (ref 0.1–1.0)
Monocytes Relative: 7.3 % (ref 3.0–12.0)
Neutro Abs: 8.5 10*3/uL — ABNORMAL HIGH (ref 1.4–7.7)
Neutrophils Relative %: 68.6 % (ref 43.0–77.0)
Platelets: 215 10*3/uL (ref 150.0–400.0)
RBC: 3.98 Mil/uL — ABNORMAL LOW (ref 4.22–5.81)
RDW: 15.7 % — ABNORMAL HIGH (ref 11.5–15.5)
WBC: 12.4 10*3/uL — ABNORMAL HIGH (ref 4.0–10.5)

## 2020-04-08 LAB — IRON: Iron: 212 ug/dL — ABNORMAL HIGH (ref 42–165)

## 2020-04-11 ENCOUNTER — Other Ambulatory Visit: Payer: Self-pay | Admitting: Family Medicine

## 2020-04-11 DIAGNOSIS — Z862 Personal history of diseases of the blood and blood-forming organs and certain disorders involving the immune mechanism: Secondary | ICD-10-CM

## 2020-04-14 DIAGNOSIS — Z01818 Encounter for other preprocedural examination: Secondary | ICD-10-CM | POA: Diagnosis not present

## 2020-04-14 DIAGNOSIS — R0600 Dyspnea, unspecified: Secondary | ICD-10-CM | POA: Diagnosis not present

## 2020-04-16 ENCOUNTER — Other Ambulatory Visit: Payer: Self-pay | Admitting: Family Medicine

## 2020-04-19 DIAGNOSIS — D631 Anemia in chronic kidney disease: Secondary | ICD-10-CM | POA: Diagnosis not present

## 2020-04-19 DIAGNOSIS — N182 Chronic kidney disease, stage 2 (mild): Secondary | ICD-10-CM | POA: Diagnosis not present

## 2020-04-19 DIAGNOSIS — I10 Essential (primary) hypertension: Secondary | ICD-10-CM | POA: Diagnosis not present

## 2020-04-19 DIAGNOSIS — N1831 Chronic kidney disease, stage 3a: Secondary | ICD-10-CM | POA: Diagnosis not present

## 2020-04-19 DIAGNOSIS — R809 Proteinuria, unspecified: Secondary | ICD-10-CM | POA: Diagnosis not present

## 2020-04-26 DIAGNOSIS — I10 Essential (primary) hypertension: Secondary | ICD-10-CM | POA: Diagnosis not present

## 2020-04-26 DIAGNOSIS — N182 Chronic kidney disease, stage 2 (mild): Secondary | ICD-10-CM | POA: Diagnosis not present

## 2020-05-27 ENCOUNTER — Telehealth: Payer: Self-pay | Admitting: *Deleted

## 2020-05-27 NOTE — Telephone Encounter (Signed)
A message was left.re: his follow up visit. 

## 2020-05-31 ENCOUNTER — Ambulatory Visit (HOSPITAL_COMMUNITY)
Admission: RE | Admit: 2020-05-31 | Discharge: 2020-05-31 | Disposition: A | Discharge status: Home / Self Care | Payer: Medicare Other | Source: Ambulatory Visit | Attending: Surgery | Admitting: Surgery

## 2020-05-31 ENCOUNTER — Other Ambulatory Visit: Payer: Self-pay

## 2020-05-31 ENCOUNTER — Encounter: Payer: Self-pay | Admitting: Surgery

## 2020-05-31 ENCOUNTER — Ambulatory Visit (INDEPENDENT_AMBULATORY_CARE_PROVIDER_SITE_OTHER)
Admission: RE | Admit: 2020-05-31 | Discharge: 2020-05-31 | Disposition: A | Discharge status: Home / Self Care | Payer: Medicare Other | Source: Ambulatory Visit | Attending: Surgery | Admitting: Surgery

## 2020-05-31 ENCOUNTER — Ambulatory Visit (INDEPENDENT_AMBULATORY_CARE_PROVIDER_SITE_OTHER): Payer: Medicare Other | Admitting: Surgery

## 2020-05-31 VITALS — BP 186/77 | HR 65 | Temp 97.5°F | Resp 20 | Ht 66.0 in | Wt 158.0 lb

## 2020-05-31 DIAGNOSIS — I70213 Atherosclerosis of native arteries of extremities with intermittent claudication, bilateral legs: Secondary | ICD-10-CM

## 2020-05-31 DIAGNOSIS — I739 Peripheral vascular disease, unspecified: Secondary | ICD-10-CM

## 2020-05-31 DIAGNOSIS — I701 Atherosclerosis of renal artery: Secondary | ICD-10-CM | POA: Diagnosis not present

## 2020-05-31 NOTE — Progress Notes (Signed)
[facility] of [facility]  Patient name: Ryan Pittman MRN: [id number] DOB: February 25, 1941 Sex: male   REASON FOR VISIT:    Follow up  HISOTRY OF PRESENT ILLNESS:   Ryan Pittman a 79 y.o.male, who isreferred by Dr. Leonard Downing evaluation of right leg claudication. Patient has a history of a left femoral to below-knee popliteal artery bypass graft by Dr. Lucky Cowboy in [place]. He underwent angiography in October 2020 which revealed severe distal right common femoral disease extending into the superficial femoral artery. He is referred for endarterectomy. He states that his activity is significantly limited by cramping in his right calf. His claudication distance is less than 100 feet. He denies rest pain or ulcers.  On 02/13/2020 he underwent right iliofemoral endarterectomy with bovine pericardial patch angioplasty.  Patient had extensive near occlusive plaque throughout.  A long 8 cm bovine patch was placed.  He had a slight increase in his creatinine which kept him an extra day in the hospital.  He had issues with wound healing which have ultimately resolved.  He states that he still has significant claudication   The patient was diagnosed with Covid several weeks after his operation.  He has a history of smoking but quit approximately 8 years ago.  He is medically managed for hypertension.  He is on a statin for hypercholesterolemia.  He suffers from coronary artery disease, status post CABG in 2019.  PAST MEDICAL HISTORY:   Past Medical History:  Diagnosis Date   Anemia    due to GIB, s/p transfusion   Anginal pain ([place]) 07/10/2018   Arthritis    back pain, much worse after consecutive golf rounds   Cancer [facility])    thyroid   Chronic kidney disease    Colon polyps    Coronary artery disease    COVID-19    Dyspnea    walking up a hill   Dysrhythmia 2020   GERD (gastroesophageal reflux disease)     Hypercalcemia    h/o, resolved as of 2012, prev due to high amount of calcium intake   Hyperlipidemia    Hypertension    Hypothyroidism    IgG gammopathy    stable as of 2012 per [facility]   Pneumonia    as a child   PVD (peripheral vascular disease) ([place])    L leg bypass, R leg stented     FAMILY HISTORY:   Family History  Problem Relation Age of Onset   Diabetes Mother    Stroke Father    Colon cancer Neg Hx    Prostate cancer Neg Hx     SOCIAL HISTORY:   Social History   Tobacco Use   Smoking status: Former Smoker    Types: Cigarettes    Quit date: 09/04/2008    Years since quitting: 11.7   Smokeless tobacco: Former Systems developer    Types: Chew   Tobacco comment: HAs quit tobacco products 2009  Substance Use Topics   Alcohol use: Not Currently    Alcohol/week: 0.0 standard drinks     ALLERGIES:   Allergies  Allergen Reactions   Penicillins Itching and Other (See Comments)    PATIENT HAS HAD A PCN REACTION WITH IMMEDIATE RASH, FACIAL/TONGUE/THROAT SWELLING, SOB, OR LIGHTHEADEDNESS WITH HYPOTENSION:  #  #  YES  #  #  Has patient had a PCN reaction causing severe rash involving mucus membranes or skin necrosis: No Has patient had a PCN reaction that required hospitalization: No Has patient had  a PCN reaction occurring within the last 10 years: No If all of the above answers are "NO", then may proceed with Cephalosporin use.    Ace Inhibitors Cough   Aspirin Other (See Comments)    H/o GI bleed   Celebrex [Celecoxib] Other (See Comments)    GI bleed   Lipitor [Atorvastatin] Other (See Comments)    myalgias     CURRENT MEDICATIONS:   Current Outpatient Medications  Medication Sig Dispense Refill   acetaminophen (TYLENOL) 500 MG tablet Take 1,000 mg by mouth every 6 (six) hours as needed for moderate pain.      amiodarone (PACERONE) 200 MG tablet TAKE 1 TABLET BY MOUTH DAILY (Patient taking differently: Take 200 mg by mouth daily. ) 90 tablet 3     amLODipine (NORVASC) 2.5 MG tablet Take 2.5 mg by mouth daily.     cephALEXin (KEFLEX) 500 MG capsule Take 1 capsule (500 mg total) by mouth 3 (three) times daily. 21 capsule 0   clopidogrel (PLAVIX) 75 MG tablet TAKE 1 TABLET BY MOUTH DAILY (Patient taking differently: Take 75 mg by mouth daily. ) 90 tablet 3   Co-Enzyme Q-10 100 MG CAPS Take 100 mg by mouth daily.      levothyroxine (SYNTHROID) 75 MCG tablet TAKE 1 TABLET BY MOUTH DAILY EXCEPT ON THURSDAYS TAKE 1.5 TABLETS 100 tablet 3   losartan (COZAAR) 50 MG tablet Take 50 mg by mouth daily.     metoprolol tartrate (LOPRESSOR) 25 MG tablet TAKE 1/2 TABLET TWICE DAILY (Patient taking differently: Take 12.5 mg by mouth 2 (two) times daily. ) 30 tablet 9   Multiple Vitamins-Minerals (MULTIVITAMIN WITH MINERALS) tablet Take 1 tablet by mouth daily. 120 tablet 2   Omega-3 Fatty Acids (FISH OIL OMEGA-3 PO) Take 1 capsule by mouth daily.      oxyCODONE (ROXICODONE) 5 MG immediate release tablet Take 1 tablet (5 mg total) by mouth every 8 (eight) hours as needed for severe pain. 20 tablet 0   pramipexole (MIRAPEX) 1 MG tablet Take 1 mg by mouth daily as needed (restless leg).      pravastatin (PRAVACHOL) 40 MG tablet Take 1 tablet (40 mg total) by mouth daily. Please make an appointment for physical exam prior to further refills. 90 tablet 0   REPATHA SURECLICK 580 MG/ML SOAJ INJECT 140MG  EVERY 14 DAYS 2 mL 5   rOPINIRole (REQUIP) 0.25 MG tablet Take 1 tablet (0.25 mg total) by mouth at bedtime. (Patient taking differently: Take 0.25 mg by mouth at bedtime as needed (restless leg). ) 30 tablet 0   tamsulosin (FLOMAX) 0.4 MG CAPS capsule Take 1 capsule (0.4 mg total) by mouth daily. 90 capsule 3   traZODone (DESYREL) 100 MG tablet TAKE 1/2-1 TABLET BY MOUTH AT BEDTIME ASNEEDED FOR SLEEP (Patient taking differently: Take 50-100 mg by mouth at bedtime as needed for sleep. ) 30 tablet 5   vitamin C (ASCORBIC ACID) 500 MG tablet Take 500  mg by mouth daily.     pantoprazole (PROTONIX) 40 MG tablet Take 1 tablet (40 mg total) by mouth daily as needed (for heartburn). 30 tablet 1   No current facility-administered medications for this visit.    REVIEW OF SYSTEMS:   [X]  denotes positive finding, [ ]  denotes negative finding Cardiac  Comments:  Chest pain or chest pressure:    Shortness of breath upon exertion:    Short of breath when lying flat:    Irregular heart rhythm:  Vascular    Pain in calf, thigh, or hip brought on by ambulation: x   Pain in feet at night that wakes you up from your sleep:     Blood clot in your veins:    Leg swelling:         Pulmonary    Oxygen at home:    Productive cough:     Wheezing:         Neurologic    Sudden weakness in arms or legs:     Sudden numbness in arms or legs:     Sudden onset of difficulty speaking or slurred speech:    Temporary loss of vision in one eye:     Problems with dizziness:         Gastrointestinal    Blood in stool:     Vomited blood:         Genitourinary    Burning when urinating:     Blood in urine:        Psychiatric    Major depression:         Hematologic    Bleeding problems:    Problems with blood clotting too easily:        Skin    Rashes or ulcers:        Constitutional    Fever or chills:      PHYSICAL EXAM:   There were no vitals filed for this visit.  GENERAL: The patient is a well-nourished male, in no acute distress. The vital signs are documented above. CARDIAC: There is a regular rate and rhythm.  VASCULAR: Nonpalpable pedal pulses PULMONARY: Non-labored respirations MUSCULOSKELETAL: There are no major deformities or cyanosis. NEUROLOGIC: No focal weakness or paresthesias are detected. SKIN: There are no ulcers or rashes noted. PSYCHIATRIC: The patient has a normal affect.  STUDIES:   I have reviewed the following studies: +-------+-----------+-----------+------------+------------+   ABI/TBI Today's  ABI Today's TBI Previous ABI Previous TBI   +-------+-----------+-----------+------------+------------+   Right  [place]      0.44     0.69     0.12       +-------+-----------+-----------+------------+------------+   Left   [place]      0.53     1.29     0.53       +-------+-----------+-----------+------------+------------+    Right leg duplex: Right: 75-99% stenosis noted in the mid common femoral artery.  Diminished superficial femoral artery waveform in the proximal segment of  22 cm/s.  Patent profundofemoral artery.   MEDICAL ISSUES:   PAD: The patient continues to have significant symptoms in his right leg.  Ultrasound today showed a velocity increase in his common femoral artery within the patch.  Because he has really had no benefit from surgery, I think that repeat angiography is indicated to evaluate his repair.  In addition he has several areas within the superficial femoral artery that may be contributing to the symptoms.  I will plan on intervening in the common femoral artery and potentially in the superficial femoral artery.  This is been scheduled for Tuesday, October 5 via a left femoral approach.    Leia Alf, MD, FACS [facility] of [facility] [phone] Pager [phone]

## 2020-05-31 NOTE — H&P (View-Only) (Signed)
Vascular and Vein Specialist of [facility]  Patient name: Ryan Pittman MRN: [id number] DOB: 08/29/1941 Sex: male   REASON FOR VISIT:    Follow up  HISOTRY OF PRESENT ILLNESS:   Ryan Pittman a 79 y.o.male, who isreferred by Dr. Leonard Downing evaluation of right leg claudication. Patient has a history of a left femoral to below-knee popliteal artery bypass graft by Dr. Lucky Cowboy in [place]. He underwent angiography in October 2020 which revealed severe distal right common femoral disease extending into the superficial femoral artery. He is referred for endarterectomy. He states that his activity is significantly limited by cramping in his right calf. His claudication distance is less than 100 feet. He denies rest pain or ulcers.  On 02/13/2020 he underwent right iliofemoral endarterectomy with bovine pericardial patch angioplasty.  Patient had extensive near occlusive plaque throughout.  A long 8 cm bovine patch was placed.  He had a slight increase in his creatinine which kept him an extra day in the hospital.  He had issues with wound healing which have ultimately resolved.  He states that he still has significant claudication   The patient was diagnosed with Covid several weeks after his operation.  He has a history of smoking but quit approximately 8 years ago.  He is medically managed for hypertension.  He is on a statin for hypercholesterolemia.  He suffers from coronary artery disease, status post CABG in 2019.  PAST MEDICAL HISTORY:   Past Medical History:  Diagnosis Date  . Anemia    due to GIB, s/p transfusion  . Anginal pain ([place]) 07/10/2018  . Arthritis    back pain, much worse after consecutive golf rounds  . Cancer ([place])    thyroid  . Chronic kidney disease   . Colon polyps   . Coronary artery disease   . COVID-19   . Dyspnea    walking up a hill  . Dysrhythmia 2020  . GERD (gastroesophageal reflux disease)   .  Hypercalcemia    h/o, resolved as of 2012, prev due to high amount of calcium intake  . Hyperlipidemia   . Hypertension   . Hypothyroidism   . IgG gammopathy    stable as of 2012 per [facility]  . Pneumonia    as a child  . PVD (peripheral vascular disease) ([facility])    L leg bypass, R leg stented     FAMILY HISTORY:   Family History  Problem Relation Age of Onset  . Diabetes Mother   . Stroke Father   . Colon cancer Neg Hx   . Prostate cancer Neg Hx     SOCIAL HISTORY:   Social History   Tobacco Use  . Smoking status: Former Smoker    Types: Cigarettes    Quit date: 09/04/2008    Years since quitting: 11.7  . Smokeless tobacco: Former Systems developer    Types: Chew  . Tobacco comment: HAs quit tobacco products 2009  Substance Use Topics  . Alcohol use: Not Currently    Alcohol/week: 0.0 standard drinks     ALLERGIES:   Allergies  Allergen Reactions  . Penicillins Itching and Other (See Comments)    PATIENT HAS HAD A PCN REACTION WITH IMMEDIATE RASH, FACIAL/TONGUE/THROAT SWELLING, SOB, OR LIGHTHEADEDNESS WITH HYPOTENSION:  #  #  YES  #  #  Has patient had a PCN reaction causing severe rash involving mucus membranes or skin necrosis: No Has patient had a PCN reaction that required hospitalization: No Has patient had  a PCN reaction occurring within the last 10 years: No If all of the above answers are "NO", then may proceed with Cephalosporin use.   . Ace Inhibitors Cough  . Aspirin Other (See Comments)    H/o GI bleed  . Celebrex [Celecoxib] Other (See Comments)    GI bleed  . Lipitor [Atorvastatin] Other (See Comments)    myalgias     CURRENT MEDICATIONS:   Current Outpatient Medications  Medication Sig Dispense Refill  . acetaminophen (TYLENOL) 500 MG tablet Take 1,000 mg by mouth every 6 (six) hours as needed for moderate pain.     [name] amiodarone (PACERONE) 200 MG tablet TAKE 1 TABLET BY MOUTH DAILY (Patient taking differently: Take 200 mg by mouth daily. ) 90 tablet 3    . amLODipine (NORVASC) 2.5 MG tablet Take 2.5 mg by mouth daily.    . cephALEXin (KEFLEX) 500 MG capsule Take 1 capsule (500 mg total) by mouth 3 (three) times daily. 21 capsule 0  . clopidogrel (PLAVIX) 75 MG tablet TAKE 1 TABLET BY MOUTH DAILY (Patient taking differently: Take 75 mg by mouth daily. ) 90 tablet 3  . Co-Enzyme Q-10 100 MG CAPS Take 100 mg by mouth daily.     [name] levothyroxine (SYNTHROID) 75 MCG tablet TAKE 1 TABLET BY MOUTH DAILY EXCEPT ON THURSDAYS TAKE 1.5 TABLETS 100 tablet 3  . losartan (COZAAR) 50 MG tablet Take 50 mg by mouth daily.    . metoprolol tartrate (LOPRESSOR) 25 MG tablet TAKE 1/2 TABLET TWICE DAILY (Patient taking differently: Take 12.5 mg by mouth 2 (two) times daily. ) 30 tablet 9  . Multiple Vitamins-Minerals (MULTIVITAMIN WITH MINERALS) tablet Take 1 tablet by mouth daily. 120 tablet 2  . Omega-3 Fatty Acids (FISH OIL OMEGA-3 PO) Take 1 capsule by mouth daily.     [name] oxyCODONE (ROXICODONE) 5 MG immediate release tablet Take 1 tablet (5 mg total) by mouth every 8 (eight) hours as needed for severe pain. 20 tablet 0  . pramipexole (MIRAPEX) 1 MG tablet Take 1 mg by mouth daily as needed (restless leg).     . pravastatin (PRAVACHOL) 40 MG tablet Take 1 tablet (40 mg total) by mouth daily. Please make an appointment for physical exam prior to further refills. 90 tablet 0  . REPATHA SURECLICK 818 MG/ML SOAJ INJECT 140MG  EVERY 14 DAYS 2 mL 5  . rOPINIRole (REQUIP) 0.25 MG tablet Take 1 tablet (0.25 mg total) by mouth at bedtime. (Patient taking differently: Take 0.25 mg by mouth at bedtime as needed (restless leg). ) 30 tablet 0  . tamsulosin (FLOMAX) 0.4 MG CAPS capsule Take 1 capsule (0.4 mg total) by mouth daily. 90 capsule 3  . traZODone (DESYREL) 100 MG tablet TAKE 1/2-1 TABLET BY MOUTH AT BEDTIME ASNEEDED FOR SLEEP (Patient taking differently: Take 50-100 mg by mouth at bedtime as needed for sleep. ) 30 tablet 5  . vitamin C (ASCORBIC ACID) 500 MG tablet Take 500  mg by mouth daily.    . pantoprazole (PROTONIX) 40 MG tablet Take 1 tablet (40 mg total) by mouth daily as needed (for heartburn). 30 tablet 1   No current facility-administered medications for this visit.    REVIEW OF SYSTEMS:   [X]  denotes positive finding, [ ]  denotes negative finding Cardiac  Comments:  Chest pain or chest pressure:    Shortness of breath upon exertion:    Short of breath when lying flat:    Irregular heart rhythm:  Vascular    Pain in calf, thigh, or hip brought on by ambulation: x   Pain in feet at night that wakes you up from your sleep:     Blood clot in your veins:    Leg swelling:         Pulmonary    Oxygen at home:    Productive cough:     Wheezing:         Neurologic    Sudden weakness in arms or legs:     Sudden numbness in arms or legs:     Sudden onset of difficulty speaking or slurred speech:    Temporary loss of vision in one eye:     Problems with dizziness:         Gastrointestinal    Blood in stool:     Vomited blood:         Genitourinary    Burning when urinating:     Blood in urine:        Psychiatric    Major depression:         Hematologic    Bleeding problems:    Problems with blood clotting too easily:        Skin    Rashes or ulcers:        Constitutional    Fever or chills:      PHYSICAL EXAM:   There were no vitals filed for this visit.  GENERAL: The patient is a well-nourished male, in no acute distress. The vital signs are documented above. CARDIAC: There is a regular rate and rhythm.  VASCULAR: Nonpalpable pedal pulses PULMONARY: Non-labored respirations MUSCULOSKELETAL: There are no major deformities or cyanosis. NEUROLOGIC: No focal weakness or paresthesias are detected. SKIN: There are no ulcers or rashes noted. PSYCHIATRIC: The patient has a normal affect.  STUDIES:   I have reviewed the following studies: +-------+-----------+-----------+------------+------------+  ABI/TBIToday's  ABIToday's TBIPrevious ABIPrevious TBI  +-------+-----------+-----------+------------+------------+  Right [place]     0.44    0.69    0.12      +-------+-----------+-----------+------------+------------+  Left  [place]     0.53    1.29    0.53      +-------+-----------+-----------+------------+------------+    Right leg duplex: Right: 75-99% stenosis noted in the mid common femoral artery.  Diminished superficial femoral artery waveform in the proximal segment of  22 cm/s.  Patent profundofemoral artery.   MEDICAL ISSUES:   PAD: The patient continues to have significant symptoms in his right leg.  Ultrasound today showed a velocity increase in his common femoral artery within the patch.  Because he has really had no benefit from surgery, I think that repeat angiography is indicated to evaluate his repair.  In addition he has several areas within the superficial femoral artery that may be contributing to the symptoms.  I will plan on intervening in the common femoral artery and potentially in the superficial femoral artery.  This is been scheduled for Tuesday, October 5 via a left femoral approach.    Leia Alf, MD, FACS [facility] of [facility] [phone] Pager [phone]

## 2020-06-01 DIAGNOSIS — Z23 Encounter for immunization: Secondary | ICD-10-CM | POA: Diagnosis not present

## 2020-06-07 ENCOUNTER — Other Ambulatory Visit: Payer: Self-pay

## 2020-06-07 ENCOUNTER — Other Ambulatory Visit
Admission: RE | Admit: 2020-06-07 | Discharge: 2020-06-07 | Disposition: A | Discharge status: Home / Self Care | Payer: Medicare Other | Source: Ambulatory Visit | Attending: Surgery | Admitting: Surgery

## 2020-06-07 DIAGNOSIS — Z20822 Contact with and (suspected) exposure to covid-19: Secondary | ICD-10-CM | POA: Insufficient documentation

## 2020-06-07 DIAGNOSIS — Z01812 Encounter for preprocedural laboratory examination: Secondary | ICD-10-CM | POA: Diagnosis not present

## 2020-06-07 LAB — SARS CORONAVIRUS 2 (TAT 6-24 HRS): SARS Coronavirus 2: NEGATIVE

## 2020-06-08 ENCOUNTER — Encounter (HOSPITAL_COMMUNITY)
Admission: RE | Disposition: A | Discharge status: Home / Self Care | Payer: Self-pay | Source: Home / Self Care | Attending: Surgery

## 2020-06-08 ENCOUNTER — Ambulatory Visit (HOSPITAL_COMMUNITY)
Admission: RE | Admit: 2020-06-08 | Discharge: 2020-06-08 | Disposition: A | Discharge status: Home / Self Care | Payer: Medicare Other | Attending: Surgery | Admitting: Surgery

## 2020-06-08 DIAGNOSIS — I129 Hypertensive chronic kidney disease with stage 1 through stage 4 chronic kidney disease, or unspecified chronic kidney disease: Secondary | ICD-10-CM | POA: Insufficient documentation

## 2020-06-08 DIAGNOSIS — Z7902 Long term (current) use of antithrombotics/antiplatelets: Secondary | ICD-10-CM | POA: Insufficient documentation

## 2020-06-08 DIAGNOSIS — Z880 Allergy status to penicillin: Secondary | ICD-10-CM | POA: Diagnosis not present

## 2020-06-08 DIAGNOSIS — Z886 Allergy status to analgesic agent status: Secondary | ICD-10-CM | POA: Insufficient documentation

## 2020-06-08 DIAGNOSIS — E039 Hypothyroidism, unspecified: Secondary | ICD-10-CM | POA: Diagnosis not present

## 2020-06-08 DIAGNOSIS — I70211 Atherosclerosis of native arteries of extremities with intermittent claudication, right leg: Secondary | ICD-10-CM | POA: Insufficient documentation

## 2020-06-08 DIAGNOSIS — Z87891 Personal history of nicotine dependence: Secondary | ICD-10-CM | POA: Diagnosis not present

## 2020-06-08 DIAGNOSIS — Z79890 Hormone replacement therapy (postmenopausal): Secondary | ICD-10-CM | POA: Diagnosis not present

## 2020-06-08 DIAGNOSIS — Z8616 Personal history of COVID-19: Secondary | ICD-10-CM | POA: Diagnosis not present

## 2020-06-08 DIAGNOSIS — Z888 Allergy status to other drugs, medicaments and biological substances status: Secondary | ICD-10-CM | POA: Diagnosis not present

## 2020-06-08 DIAGNOSIS — K219 Gastro-esophageal reflux disease without esophagitis: Secondary | ICD-10-CM | POA: Insufficient documentation

## 2020-06-08 DIAGNOSIS — N189 Chronic kidney disease, unspecified: Secondary | ICD-10-CM | POA: Diagnosis not present

## 2020-06-08 DIAGNOSIS — E785 Hyperlipidemia, unspecified: Secondary | ICD-10-CM | POA: Diagnosis not present

## 2020-06-08 DIAGNOSIS — I2510 Atherosclerotic heart disease of native coronary artery without angina pectoris: Secondary | ICD-10-CM | POA: Insufficient documentation

## 2020-06-08 DIAGNOSIS — Z79899 Other long term (current) drug therapy: Secondary | ICD-10-CM | POA: Insufficient documentation

## 2020-06-08 HISTORY — PX: ABDOMINAL AORTOGRAM W/LOWER EXTREMITY: CATH118223

## 2020-06-08 HISTORY — PX: PERIPHERAL VASCULAR BALLOON ANGIOPLASTY: CATH118281

## 2020-06-08 HISTORY — PX: PERIPHERAL VASCULAR ATHERECTOMY: CATH118256

## 2020-06-08 LAB — POCT ACTIVATED CLOTTING TIME
Activated Clotting Time: 208 seconds
Activated Clotting Time: 241 seconds
Activated Clotting Time: 175 seconds

## 2020-06-08 LAB — POCT I-STAT, CHEM 8
BUN: 20 mg/dL (ref 8–23)
Calcium, Ion: 1.29 mmol/L (ref 1.15–1.40)
Chloride: 107 mmol/L (ref 98–111)
Creatinine, Ser: 1.2 mg/dL (ref 0.61–1.24)
Glucose, Bld: 106 mg/dL — ABNORMAL HIGH (ref 70–99)
HCT: 35 % — ABNORMAL LOW (ref 39.0–52.0)
Hemoglobin: 11.9 g/dL — ABNORMAL LOW (ref 13.0–17.0)
Potassium: 4.2 mmol/L (ref 3.5–5.1)
Sodium: 141 mmol/L (ref 135–145)
TCO2: 24 mmol/L (ref 22–32)

## 2020-06-08 SURGERY — ABDOMINAL AORTOGRAM W/LOWER EXTREMITY
Anesthesia: LOCAL | Laterality: Right

## 2020-06-08 MED ORDER — SODIUM CHLORIDE 0.9 % IV SOLN: 250.0000 mL | INTRAVENOUS | Status: DC | PRN

## 2020-06-08 MED ORDER — VIPERSLIDE LUBRICANT OPTIME: TOPICAL | Status: DC | PRN

## 2020-06-08 MED ORDER — OXYCODONE HCL 5 MG PO TABS: 5.0000 mg | ORAL_TABLET | ORAL | Status: DC | PRN

## 2020-06-08 MED ORDER — LABETALOL HCL 5 MG/ML IV SOLN: 10.0000 mg | INTRAVENOUS | Status: DC | PRN

## 2020-06-08 MED ORDER — MIDAZOLAM HCL 2 MG/2ML IJ SOLN
INTRAMUSCULAR | Status: AC
  Filled 2020-06-08: qty 2

## 2020-06-08 MED ORDER — HEPARIN (PORCINE) IN NACL 1000-0.9 UT/500ML-% IV SOLN
INTRAVENOUS | Status: DC | PRN
  Administered 2020-06-08 (×2): 500 mL

## 2020-06-08 MED ORDER — SODIUM CHLORIDE 0.9% FLUSH: 3.0000 mL | Freq: Two times a day (BID) | INTRAVENOUS | Status: DC

## 2020-06-08 MED ORDER — SODIUM CHLORIDE 0.9 % IV SOLN: INTRAVENOUS | Status: DC

## 2020-06-08 MED ORDER — FENTANYL CITRATE (PF) 100 MCG/2ML IJ SOLN
INTRAMUSCULAR | Status: DC | PRN
Start: 2020-06-08 — End: 2020-06-08
  Administered 2020-06-08: 50 ug via INTRAVENOUS
  Administered 2020-06-08: 25 ug via INTRAVENOUS

## 2020-06-08 MED ORDER — ONDANSETRON HCL 4 MG/2ML IJ SOLN: 4.0000 mg | Freq: Four times a day (QID) | INTRAMUSCULAR | Status: DC | PRN

## 2020-06-08 MED ORDER — SODIUM CHLORIDE 0.9 % WEIGHT BASED INFUSION: 1.0000 mL/kg/h | INTRAVENOUS | Status: DC

## 2020-06-08 MED ORDER — HYDRALAZINE HCL 20 MG/ML IJ SOLN: 5.0000 mg | INTRAMUSCULAR | Status: DC | PRN

## 2020-06-08 MED ORDER — ACETAMINOPHEN 325 MG PO TABS: 650.0000 mg | ORAL_TABLET | ORAL | Status: DC | PRN

## 2020-06-08 MED ORDER — HEPARIN SODIUM (PORCINE) 1000 UNIT/ML IJ SOLN
INTRAMUSCULAR | Status: DC | PRN
  Administered 2020-06-08: 7000 [IU] via INTRAVENOUS

## 2020-06-08 MED ORDER — SODIUM CHLORIDE 0.9% FLUSH: 3.0000 mL | INTRAVENOUS | Status: DC | PRN

## 2020-06-08 MED ORDER — MORPHINE SULFATE (PF) 2 MG/ML IV SOLN: 2.0000 mg | INTRAVENOUS | Status: DC | PRN

## 2020-06-08 MED ORDER — HEPARIN (PORCINE) IN NACL 1000-0.9 UT/500ML-% IV SOLN
INTRAVENOUS | Status: AC
  Filled 2020-06-08: qty 1000

## 2020-06-08 MED ORDER — HEPARIN SODIUM (PORCINE) 1000 UNIT/ML IJ SOLN
INTRAMUSCULAR | Status: AC
  Filled 2020-06-08: qty 1

## 2020-06-08 MED ORDER — MIDAZOLAM HCL 2 MG/2ML IJ SOLN
INTRAMUSCULAR | Status: DC | PRN
  Administered 2020-06-08: 2 mg via INTRAVENOUS
  Administered 2020-06-08: 1 mg via INTRAVENOUS

## 2020-06-08 MED ORDER — NITROGLYCERIN IN D5W 200-5 MCG/ML-% IV SOLN
INTRAVENOUS | Status: AC
  Filled 2020-06-08: qty 250

## 2020-06-08 MED ORDER — FENTANYL CITRATE (PF) 100 MCG/2ML IJ SOLN
INTRAMUSCULAR | Status: AC
  Filled 2020-06-08: qty 2

## 2020-06-08 MED ORDER — LIDOCAINE HCL (PF) 1 % IJ SOLN
INTRAMUSCULAR | Status: AC
  Filled 2020-06-08: qty 30

## 2020-06-08 MED ORDER — VERAPAMIL HCL 2.5 MG/ML IV SOLN
INTRAVENOUS | Status: AC
  Filled 2020-06-08: qty 2

## 2020-06-08 MED ORDER — LIDOCAINE HCL (PF) 1 % IJ SOLN
INTRAMUSCULAR | Status: DC | PRN
  Administered 2020-06-08: 18 mL

## 2020-06-08 SURGICAL SUPPLY — 26 items
BALLN STERLING OTW 6X80X135 (BALLOONS) ×3
BALLOON STERLING OTW 6X80X135 (BALLOONS) IMPLANT
CATH OMNI FLUSH 5F 65CM (CATHETERS) ×2 IMPLANT
CATH SOFT-VU ST 4F 90CM (CATHETERS) ×1 IMPLANT
DCB RANGER 5.0X150 150 (BALLOONS) IMPLANT
DCB RANGER 6.0X40 135 (BALLOONS) IMPLANT
DIAMONDBACK CLASSIC OAS 2.0MM (CATHETERS) ×3
KIT ENCORE 26 ADVANTAGE (KITS) ×2 IMPLANT
KIT MICROPUNCTURE NIT STIFF (SHEATH) ×2 IMPLANT
KIT PV (KITS) ×3 IMPLANT
LUBRICANT VIPERSLIDE CORONARY (MISCELLANEOUS) ×1 IMPLANT
RANGER DCB 5.0X150 150 (BALLOONS) ×3
RANGER DCB 6.0X40 135 (BALLOONS) ×6
SHEATH PINNACLE 5F 10CM (SHEATH) ×1 IMPLANT
SHEATH PINNACLE MP 6F 45CM (SHEATH) ×1 IMPLANT
SHEATH PROBE COVER 6X72 (BAG) ×1 IMPLANT
SYR MEDRAD MARK V 150ML (SYRINGE) ×1 IMPLANT
SYSTEM DIMODBCK CLSC OAS 2.0MM (CATHETERS) ×1 IMPLANT
TRANSDUCER W/STOPCOCK (MISCELLANEOUS) ×3 IMPLANT
TRAY PV CATH (CUSTOM PROCEDURE TRAY) ×3 IMPLANT
TUBE CONN 8.8X1320 FR HP M-F (CONNECTOR) ×1 IMPLANT
WIRE AMPLATZ SS-J .035X260CM (WIRE) ×2 IMPLANT
WIRE G V18X300CM (WIRE) ×1 IMPLANT
WIRE ROSEN-J .035X180CM (WIRE) ×1 IMPLANT
WIRE STARTER BENTSON 035X150 (WIRE) ×1 IMPLANT
WIRE VIPER WIRECTO 0.014 (WIRE) ×4 IMPLANT

## 2020-06-08 NOTE — Interval H&P Note (Signed)
History and Physical Interval Note:  06/08/2020 9:59 AM  Ryan Pittman  has presented today for surgery, with the diagnosis of claudication.  The various methods of treatment have been discussed with the patient and family. After consideration of risks, benefits and other options for treatment, the patient has consented to  Procedure(s): ABDOMINAL AORTOGRAM W/LOWER EXTREMITY (N/A) as a surgical intervention.  The patient's history has been reviewed, patient examined, no change in status, stable for surgery.  I have reviewed the patient's chart and labs.  Questions were answered to the patient's satisfaction.     [name]

## 2020-06-08 NOTE — Op Note (Signed)
Patient name: SHEDDRICK LATTANZIO MRN: [id number] DOB: 1940/10/12 Sex: male  06/08/2020 Pre-operative Diagnosis: right leg claudication Post-operative diagnosis:  Same Surgeon:  [name] Procedure Performed:  1.  Ultrasound-guided access, left femoral artery  2.  Abdominal aortogram  3.  Bilateral lower extremity runoff  4.  Drug-coated balloon angioplasty, right external iliac artery  5.  Drug-coated balloon angioplasty, right common femoral artery  6.  Atherectomy with drug-coated balloon angioplasty, right superficial femoral artery  7.  Conscious sedation, 88 minutes   Indications: The patient has had bilateral revascularization.  On the right he has had a femoral endarterectomy with stenting.  He has residual symptoms.  Ultrasound identified a high-grade lesion in the common femoral artery.  He is here for further evaluation and possible intervention.  Procedure:  The patient was identified in the holding area and taken to room 8.  The patient was then placed supine on the table and prepped and draped in the usual sterile fashion.  A time out was called.  Conscious sedation was administered with the use of IV fentanyl and Versed under continuous physician and nurse monitoring.  Heart rate, blood pressure, and oxygen saturation were continuously monitored.  Total sedation time was 8minutes.  Ultrasound was used to evaluate the left common femoral artery.  It was patent .  A digital ultrasound image was acquired.  A micropuncture needle was used to access the left common femoral artery under ultrasound guidance.  An 018 wire was advanced without resistance and a micropuncture sheath was placed.  The 018 wire was removed and a benson wire was placed.  The micropuncture sheath was exchanged for a 5 french sheath.  An omniflush catheter was advanced over the wire to the level of L-1.  An abdominal angiogram was obtained.  Next,The catheter was brought down to the aortic bifurcation and  bilateral runoff was performed. Findings:   Aortogram: No significant renal artery stenosis was identified.  The infrarenal abdominal aorta is widely patent.  Bilateral common and external iliac arteries are patent however on the distal right external iliac artery there is a high-grade lesion extending into the right common femoral artery.  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  Patch angioplasty of the common femoral is widely patent.  The profunda is widely patent.  The superficial femoral artery is patent however between the patch and a mid SFA stent, there is a greater than 60% stenosis.  There is diffuse luminal irregularity within the stent.  No isolated stenosis greater than 50%.  The remaining portion of the superficial femoral and popliteal artery are patent without hemodynamically significant stenosis.  There is three-vessel runoff.  Left Lower Extremity: The left common femoral and profundofemoral artery are widely patent.  There is a bypass graft originating in the common femoral artery with the distal anastomosis to the below-knee popliteal artery.  Both anastomoses are widely patent.  There is two-vessel runoff via the anterior tibial and peroneal artery  Intervention: After the above images were acquired the decision made to proceed with intervention.  Had difficulty getting the 6 French sheath into the external iliac artery.  The patient was then fully heparinized.  It got hung up in the iliac stent over top of the bifurcation.  I elected to work from this location.  A v-18 wire was inserted into the distal superficial femoral artery.  This was exchanged out for a Viper wire.  I first performed drug-coated  balloon angioplasty of the distal right external iliac and proximal right common femoral artery using a 6 x 40 drug-coated Ranger balloon.  This was held up for 3 minutes.  Completion imaging showed resolution of the stenosis.  Next  attention was turned towards the superficial femoral artery.  A CSI 2.0 classic device was inserted.  Orbital atherectomy was performed of the superficial femoral artery down to the previously placed stent.  This was done at low medium and high speeds.  I then used a 5 x 150 drug-coated Ranger balloon to perform balloon angioplasty of the treated area.  This was held up for 3 minutes.  Once this was completed I advanced the balloon into the previously deployed stent and perform angioplasty of the stent for the diffuse in-stent stenosis.  On completion imaging there was resolution of the stenosis within the superficial femoral artery.  There appeared to be mild extravasation within the stent.  I then inserted a 6 x 80 balloon and performed a prolonged inflation.  Follow-up imaging showed persistent mild extravasation that should resolve without intervention.  At this point the sheath was withdrawn to the left external iliac artery.  The patient to take a whole year for sheath pull once her coagulation profile correct  Impression:  #1  Greater than 80% stenosis in the distal right external iliac and proximal right common femoral artery at the location of the edge of the patch.  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  #3  Widely patent left femoral-popliteal bypass graft    V. [name], M.D., [facility] [place] Office: [phone] Pager:  [phone]

## 2020-06-08 NOTE — Progress Notes (Signed)
Up and walked and tolerated well; left groin stable, no bleeding or hematoma 

## 2020-06-08 NOTE — Discharge Instructions (Signed)
Femoral Site Care This sheet gives you information about how to care for yourself after your procedure. Your health care provider may also give you more specific instructions. If you have problems or questions, contact your health care provider. What can I expect after the procedure? After the procedure, it is common to have:  Bruising that usually fades within 1-2 weeks.  Tenderness at the site. Follow these instructions at home: Wound care  Follow instructions from your health care provider about how to take care of your insertion site. Make sure you: ? Wash your hands with soap and water before you change your bandage (dressing). If soap and water are not available, use hand sanitizer. ? Change your dressing as told by your health care provider. ? Leave stitches (sutures), skin glue, or adhesive strips in place. These skin closures may need to stay in place for 2 weeks or longer. If adhesive strip edges start to loosen and curl up, you may trim the loose edges. Do not remove adhesive strips completely unless your health care provider tells you to do that.  Do not take baths, swim, or use a hot tub until your health care provider approves.  You may shower 24-48 hours after the procedure or as told by your health care provider. ? Gently wash the site with plain soap and water. ? Pat the area dry with a clean towel. ? Do not rub the site. This may cause bleeding.  Do not apply powder or lotion to the site. Keep the site clean and dry.  Check your femoral site every day for signs of infection. Check for: ? Redness, swelling, or pain. ? Fluid or blood. ? Warmth. ? Pus or a bad smell. Activity  For the first 2-3 days after your procedure, or as long as directed: ? Avoid climbing stairs as much as possible. ? Do not squat.  Do not lift anything that is heavier than 10 lb (4.5 kg), or the limit that you are told, until your health care provider says that it is safe.  Rest as  directed. ? Avoid sitting for a long time without moving. Get up to take short walks every 1-2 hours.  Do not drive for 24 hours if you were given a medicine to help you relax (sedative). General instructions  Take over-the-counter and prescription medicines only as told by your health care provider.  Keep all follow-up visits as told by your health care provider. This is important. Contact a health care provider if you have:  A fever or chills.  You have redness, swelling, or pain around your insertion site. Get help right away if:  The catheter insertion area swells very fast.  You pass out.  You suddenly start to sweat or your skin gets clammy.  The catheter insertion area is bleeding, and the bleeding does not stop when you hold steady pressure on the area.  The area near or just beyond the catheter insertion site becomes pale, cool, tingly, or numb. These symptoms may represent a serious problem that is an emergency. Do not wait to see if the symptoms will go away. Get medical help right away. Call your local emergency services (911 in the [place] Do not drive yourself to the hospital. Summary  After the procedure, it is common to have bruising that usually fades within 1-2 weeks.  Check your femoral site every day for signs of infection.  Do not lift anything that is heavier than 10 lb (4.5 kg), or the   limit that you are told, until your health care provider says that it is safe. This information is not intended to replace advice given to you by your health care provider. Make sure you discuss any questions you have with your health care provider. Document Revised: 09/03/2017 Document Reviewed: 09/03/2017 Elsevier Patient Education  2020 [employer]  

## 2020-06-08 NOTE — Progress Notes (Signed)
Order for sheath removal verified per post procedural orders. Procedure explained to patient and left femoral artery access site assessed: level 0, palpable dorsalis pedis and posterior tibial pulses. 6 French Sheath removed and manual pressure applied for 20 minutes. Pre, peri, & post procedural vitals: HR 50, RR 12, O2 Sat 100%, BP 151/35 Pain level 0. Distal pulses remained intact after sheath removal. Access site level 0 and dressed with 4X4 gauze and tegaderm. RN confirmed condition of site. Post procedural instructions discussed with return demonstration from patient.

## 2020-06-08 NOTE — Progress Notes (Signed)
Discharge instructions reviewed with patient and family. Verbalized understanding. 

## 2020-06-08 NOTE — Progress Notes (Signed)
Report received from Fredda Hammed, RN

## 2020-06-09 ENCOUNTER — Encounter (HOSPITAL_COMMUNITY): Payer: Self-pay | Admitting: Surgery

## 2020-06-11 ENCOUNTER — Encounter: Payer: Self-pay | Admitting: Family Medicine

## 2020-06-11 LAB — LAB REPORT - SCANNED
Cholesterol: 119
HDL: 69
LDL (calc): 23
Triglycerides: 209 — AB (ref 40–160)

## 2020-06-21 DIAGNOSIS — D225 Melanocytic nevi of trunk: Secondary | ICD-10-CM | POA: Diagnosis not present

## 2020-06-21 DIAGNOSIS — D2261 Melanocytic nevi of right upper limb, including shoulder: Secondary | ICD-10-CM | POA: Diagnosis not present

## 2020-06-21 DIAGNOSIS — L570 Actinic keratosis: Secondary | ICD-10-CM | POA: Diagnosis not present

## 2020-06-21 DIAGNOSIS — X32XXXA Exposure to sunlight, initial encounter: Secondary | ICD-10-CM | POA: Diagnosis not present

## 2020-06-21 DIAGNOSIS — Z85828 Personal history of other malignant neoplasm of skin: Secondary | ICD-10-CM | POA: Diagnosis not present

## 2020-06-21 DIAGNOSIS — D2262 Melanocytic nevi of left upper limb, including shoulder: Secondary | ICD-10-CM | POA: Diagnosis not present

## 2020-06-21 DIAGNOSIS — Z08 Encounter for follow-up examination after completed treatment for malignant neoplasm: Secondary | ICD-10-CM | POA: Diagnosis not present

## 2020-06-25 ENCOUNTER — Other Ambulatory Visit: Payer: Self-pay

## 2020-06-25 DIAGNOSIS — I739 Peripheral vascular disease, unspecified: Secondary | ICD-10-CM

## 2020-06-30 DIAGNOSIS — C73 Malignant neoplasm of thyroid gland: Secondary | ICD-10-CM | POA: Diagnosis not present

## 2020-06-30 DIAGNOSIS — Z719 Counseling, unspecified: Secondary | ICD-10-CM | POA: Diagnosis not present

## 2020-06-30 DIAGNOSIS — Z85850 Personal history of malignant neoplasm of thyroid: Secondary | ICD-10-CM | POA: Diagnosis not present

## 2020-06-30 DIAGNOSIS — E890 Postprocedural hypothyroidism: Secondary | ICD-10-CM | POA: Diagnosis not present

## 2020-07-02 ENCOUNTER — Other Ambulatory Visit: Payer: Self-pay | Admitting: Cardiovascular Disease

## 2020-07-12 ENCOUNTER — Encounter: Payer: Medicare Other | Admitting: Surgery

## 2020-07-12 ENCOUNTER — Ambulatory Visit (INDEPENDENT_AMBULATORY_CARE_PROVIDER_SITE_OTHER): Payer: Medicare Other | Admitting: Physician Assistant

## 2020-07-12 ENCOUNTER — Ambulatory Visit (HOSPITAL_COMMUNITY)
Admission: RE | Admit: 2020-07-12 | Discharge: 2020-07-12 | Disposition: A | Discharge status: Home / Self Care | Payer: Medicare Other | Source: Ambulatory Visit | Attending: Physician Assistant | Admitting: Physician Assistant

## 2020-07-12 ENCOUNTER — Other Ambulatory Visit: Payer: Self-pay

## 2020-07-12 ENCOUNTER — Ambulatory Visit (INDEPENDENT_AMBULATORY_CARE_PROVIDER_SITE_OTHER)
Admission: RE | Admit: 2020-07-12 | Discharge: 2020-07-12 | Disposition: A | Discharge status: Home / Self Care | Payer: Medicare Other | Source: Ambulatory Visit | Attending: Physician Assistant | Admitting: Physician Assistant

## 2020-07-12 VITALS — BP 157/65 | HR 70 | Temp 97.9°F | Resp 20 | Ht 66.0 in | Wt 161.5 lb

## 2020-07-12 DIAGNOSIS — I739 Peripheral vascular disease, unspecified: Secondary | ICD-10-CM | POA: Insufficient documentation

## 2020-07-12 DIAGNOSIS — I701 Atherosclerosis of renal artery: Secondary | ICD-10-CM

## 2020-07-12 NOTE — Progress Notes (Signed)
Office Note     CC:  follow up Requesting Provider:  Tonia Ghent, MD  HPI: Ryan Pittman is a 79 y.o. (11/12/1940) male who presents for follow-up after recent arteriogram secondary to right lower extremity claudication.  The patient underwent aortogram with bilateral lower extremity runoff on June 08, 2020 by Dr. Trula Slade.  He intervened with drug-coated balloon angioplasty of the right external iliac artery and the right common femoral artery.  He also underwent atherectomy and DCBA of the right superficial femoral artery.  (See below.)  His wife accompanies him today.  He denies lower extremity pain with exercise or rest pain.  He states he has not played golf yet, but he walks stairs daily at work and has no claudication symptoms.  The patient's past surgical history is significant for right iliofemoral endarterectomy with bovine patch angioplasty in February 13, 2020.  Prior right SFA stent. He has a remote history of left femoral to popliteal artery bypass graft with reversed vein. The graft was patent on recent angiography.  He has a history of hypertension and is maintained on calcium channel blocker, ARB, beta-blocker. He is compliant with Plavix and statin. History of coronary artery disease status post CABG, right GSV harvest. Sees Dr. Gwenlyn Found. Prior smoking history; quit in 2009  Impression:               #1  Greater than 80% stenosis in the distal right external iliac and proximal right common femoral artery at the location of the edge of the patch.  This was treated with drug-coated balloon angioplasty using a 6 mm balloon               #2  Proximal right superficial femoral artery stenosis greater than 70%, treated with orbital atherectomy using a CSI 2.0 classic device followed by 5 mm drug-coated balloon angioplasty.               #3  Widely patent left femoral-popliteal bypass graft   Past Medical History:  Diagnosis Date  . Anemia    due to GIB, s/p transfusion  .  Anginal pain ([place]) 07/10/2018  . Arthritis    back pain, much worse after consecutive golf rounds  . Cancer ([place])    thyroid  . Chronic kidney disease   . Colon polyps   . Coronary artery disease   . COVID-19   . Dyspnea    walking up a hill  . Dysrhythmia 2020  . GERD (gastroesophageal reflux disease)   . Hypercalcemia    h/o, resolved as of 2012, prev due to high amount of calcium intake  . Hyperlipidemia   . Hypertension   . Hypothyroidism   . IgG gammopathy    stable as of 2012 per [facility]  . Pneumonia    as a child  . PVD (peripheral vascular disease) ([place])    L leg bypass, R leg stented    Past Surgical History:  Procedure Laterality Date  .  dental implant left bottom-did have bleeding     . ABDOMINAL AORTOGRAM W/LOWER EXTREMITY Bilateral 06/26/2019   Procedure: ABDOMINAL AORTOGRAM W/LOWER EXTREMITY;  Surgeon: [name], MD;  Location: [facility];  Service: Cardiovascular;  Laterality: Bilateral;  . ABDOMINAL AORTOGRAM W/LOWER EXTREMITY N/A 06/08/2020   Procedure: ABDOMINAL AORTOGRAM W/LOWER EXTREMITY;  Surgeon: Serafina Mitchell, MD;  Location: [facility];  Service: Cardiovascular;  Laterality: N/A;  . BYPASS GRAFT     L leg  .  CARDIAC CATHETERIZATION  06/24/2018  . COLONOSCOPY WITH PROPOFOL N/A 05/04/2015   Procedure: COLONOSCOPY WITH PROPOFOL;  Surgeon: Lollie Sails, MD;  Location: [facility];  Service: Endoscopy;  Laterality: N/A;  . CORONARY ARTERY BYPASS GRAFT N/A 07/11/2018   Procedure: CORONARY ARTERY BYPASS GRAFTING (CABG) x three, using left internal mammary artery and right leg greater saphenous vein harvested endoscopically;  Surgeon: Gaye Pollack, MD;  Location: [facility];  Service: Open Heart Surgery;  Laterality: N/A;  . ENDARTERECTOMY FEMORAL Right 02/13/2020   Procedure: RIGHT ENDARTERECTOMY FEMORAL WITH BOVINE PATCH;  Surgeon: Serafina Mitchell, MD;  Location: [facility];  Service: Vascular;  Laterality: Right;  . LEFT HEART CATH  AND CORONARY ANGIOGRAPHY N/A 06/24/2018   Procedure: LEFT HEART CATH AND CORONARY ANGIOGRAPHY;  Surgeon: [name], MD;  Location: [facility];  Service: Cardiovascular;  Laterality: N/A;  . PERIPHERAL VASCULAR ATHERECTOMY Right 06/08/2020   Procedure: PERIPHERAL VASCULAR ATHERECTOMY;  Surgeon: Serafina Mitchell, MD;  Location: [facility];  Service: Cardiovascular;  Laterality: Right;  superficial femoral  . PERIPHERAL VASCULAR BALLOON ANGIOPLASTY Right 06/08/2020   Procedure: PERIPHERAL VASCULAR BALLOON ANGIOPLASTY;  Surgeon: Serafina Mitchell, MD;  Location: [facility];  Service: Cardiovascular;  Laterality: Right;  External iliac  . TEE WITHOUT CARDIOVERSION N/A 07/11/2018   Procedure: TRANSESOPHAGEAL ECHOCARDIOGRAM (TEE);  Surgeon: Gaye Pollack, MD;  Location: [facility];  Service: Open Heart Surgery;  Laterality: N/A;  . THYROIDECTOMY, PARTIAL  2016  . VASCULAR SURGERY      Social History   Socioeconomic History  . Marital status: Married    Spouse name: Not on file  . Number of children: Not on file  . Years of education: Not on file  . Highest education level: Not on file  Occupational History  . Not on file  Tobacco Use  . Smoking status: Former Smoker    Types: Cigarettes    Quit date: 09/04/2008    Years since quitting: 11.8  . Smokeless tobacco: Former Systems developer    Types: Chew  . Tobacco comment: HAs quit tobacco products 2009  Vaping Use  . Vaping Use: Never used  Substance and Sexual Activity  . Alcohol use: Not Currently    Alcohol/week: 0.0 standard drinks  . Drug use: No  . Sexual activity: Yes  Other Topics Concern  . Not on file  Social History Narrative   Married 50+ years, 2 kids   [place] to play golf   Social Determinants of Health   Financial Resource Strain:   . Difficulty of Paying Living Expenses: Not on file  Food Insecurity:   . Worried About Charity fundraiser in the Last Year: Not on file  . Ran Out  of Food in the Last Year: Not on file  Transportation Needs:   . Lack of Transportation (Medical): Not on file  . Lack of Transportation (Non-Medical): Not on file  Physical Activity:   . Days of Exercise per Week: Not on file  . Minutes of Exercise per Session: Not on file  Stress:   . Feeling of Stress : Not on file  Social Connections:   . Frequency of Communication with Friends and Family: Not on file  . Frequency of Social Gatherings with Friends and Family: Not on file  . Attends Religious Services: Not on file  . Active Member of Clubs or Organizations: Not on file  . Attends Archivist  Meetings: Not on file  . Marital Status: Not on file  Intimate Partner Violence:   . Fear of Current or Ex-Partner: Not on file  . Emotionally Abused: Not on file  . Physically Abused: Not on file  . Sexually Abused: Not on file   Family History  Problem Relation Age of Onset  . Diabetes Mother   . Stroke Father   . Colon cancer Neg Hx   . Prostate cancer Neg Hx     Current Outpatient Medications  Medication Sig Dispense Refill  . amLODipine (NORVASC) 5 MG tablet Take 5 mg by mouth daily.     . clopidogrel (PLAVIX) 75 MG tablet TAKE 1 TABLET BY MOUTH DAILY (Patient taking differently: Take 75 mg by mouth daily. ) 90 tablet 3  . levothyroxine (SYNTHROID) 75 MCG tablet TAKE 1 TABLET BY MOUTH DAILY EXCEPT ON THURSDAYS TAKE 1.5 TABLETS (Patient taking differently: Take 75 mcg by mouth daily before breakfast. ) 100 tablet 3  . losartan (COZAAR) 100 MG tablet Take 100 mg by mouth daily.     . metoprolol tartrate (LOPRESSOR) 25 MG tablet TAKE 1/2 TABLET TWICE DAILY 30 tablet 9  . pramipexole (MIRAPEX) 1 MG tablet Take 1 mg by mouth 3 (three) times daily as needed (restless leg).     . pravastatin (PRAVACHOL) 40 MG tablet Take 1 tablet (40 mg total) by mouth daily. Please make an appointment for physical exam prior to further refills. 90 tablet 0  . REPATHA SURECLICK 778 MG/ML SOAJ  INJECT 140MG  EVERY 14 DAYS (Patient taking differently: Inject 140 mg into the skin every 14 (fourteen) days. ) 2 mL 5  . tamsulosin (FLOMAX) 0.4 MG CAPS capsule Take 1 capsule (0.4 mg total) by mouth daily. 90 capsule 3  . traZODone (DESYREL) 100 MG tablet TAKE 1/2-1 TABLET BY MOUTH AT BEDTIME ASNEEDED FOR SLEEP (Patient taking differently: Take 50-100 mg by mouth at bedtime as needed for sleep. ) 30 tablet 5  . vitamin B-12 (CYANOCOBALAMIN) 1000 MCG tablet Take 1,000 mcg by mouth 4 (four) times a week.     No current facility-administered medications for this visit.    Allergies  Allergen Reactions  . Penicillins Itching and Other (See Comments)    PATIENT HAS HAD A PCN REACTION WITH IMMEDIATE RASH, FACIAL/TONGUE/THROAT SWELLING, SOB, OR LIGHTHEADEDNESS WITH HYPOTENSION:  #  #  YES  #  #  Has patient had a PCN reaction causing severe rash involving mucus membranes or skin necrosis: No Has patient had a PCN reaction that required hospitalization: No Has patient had a PCN reaction occurring within the last 10 years: No If all of the above answers are "NO", then may proceed with Cephalosporin use.   . Ace Inhibitors Cough  . Aspirin Other (See Comments)    H/o GI bleed  . Celebrex [Celecoxib] Other (See Comments)    GI bleed  . Lipitor [Atorvastatin] Other (See Comments)    myalgias     REVIEW OF SYSTEMS:   [X]  denotes positive finding, [ ]  denotes negative finding Cardiac  Comments:  Chest pain or chest pressure:    Shortness of breath upon exertion:    Short of breath when lying flat:    Irregular heart rhythm:        Vascular    Pain in calf, thigh, or hip brought on by ambulation:    Pain in feet at night that wakes you up from your sleep:     Blood clot in your  veins:    Leg swelling:         Pulmonary    Oxygen at home:    Productive cough:     Wheezing:         Neurologic    Sudden weakness in arms or legs:     Sudden numbness in arms or legs:     Sudden onset  of difficulty speaking or slurred speech:    Temporary loss of vision in one eye:     Problems with dizziness:         Gastrointestinal    Blood in stool:     Vomited blood:         Genitourinary    Burning when urinating:     Blood in urine:        Psychiatric    Major depression:         Hematologic    Bleeding problems:    Problems with blood clotting too easily:        Skin    Rashes or ulcers:        Constitutional    Fever or chills:      PHYSICAL EXAMINATION:  Vitals:   07/12/20 1116  BP: (!) 157/65  Pulse: 70  Resp: 20  Temp: 97.9 F (36.6 C)  SpO2: 97%   General:  WDWN in NAD; vital signs documented above Gait: Unaided, no ataxia HENT: WNL, normocephalic Pulmonary: normal non-labored breathing , without Rales, rhonchi,  wheezing Cardiac: regular HR, without  Murmurs without carotid bruit Abdomen: soft, NT, no masses Skin: without rashes Vascular Exam/Pulses: 2+ radial and femoral pulses bilaterally.  Pedal pulses are not palpable Extremities: without ischemic changes, without Gangrene , without cellulitis; without open wounds;  Musculoskeletal: no muscle wasting or atrophy  Neurologic: A&O X 3;  No focal weakness or paresthesias are detected Psychiatric:  The pt has Normal affect.  Aortogram June 08, 2020 Findings:                Aortogram: No significant renal artery stenosis was identified.  The infrarenal abdominal aorta is widely patent.  Bilateral common and external iliac arteries are patent however on the distal right external iliac artery there is a high-grade lesion extending into the right common femoral artery.               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  Patch angioplasty of the common femoral is widely patent.  The profunda is widely patent.  The superficial femoral artery is patent however between the patch and a mid SFA stent, there is a greater than 60% stenosis.  There  is diffuse luminal irregularity within the stent.  No isolated stenosis greater than 50%.  The remaining portion of the superficial femoral and popliteal artery are patent without hemodynamically significant stenosis.  There is three-vessel runoff.               Left Lower Extremity: The left common femoral and profundofemoral artery are widely patent.  There is a bypass graft originating in the common femoral artery with the distal anastomosis to the below-knee popliteal artery.  Both anastomoses are widely patent.  There is two-vessel runoff via the anterior tibial and peroneal artery   Non-Invasive Vascular Imaging:   Right lower extremity: Today's ABI is 1.01 and TBI 0.38.  PTA waveform is biphasic and DP waveform is monophasic.  Right great toe pressure is 57 Left lower extremity:  Noncompressible left ABI.  TBI is 0.36 his previous TBI was 0.53.  Left waveforms are triphasic.  Toe pressure is 54  Review of the patient's lower extremity arterial duplex study reveals 50 to 74% stenosis in the distal right popliteal artery with biphasic waveforms.  On the left, the deep femoral artery has a peak systolic velocity of 660 cm/s with biphasic signals.  The patient's left femoral to popliteal graft is patent with normal velocities and biphasic waveforms.  ASSESSMENT/PLAN:: 79 y.o. male here for follow up for PAD. Recent DCBA of right EIA, right CFA and DCBA and atherectomy of right SFA.  His claudication symptoms have resolved.  No evidence of stenosis of the left femoropopliteal bypass.  Distal right popliteal artery stenosis without hemodynamic significance on recent arteriography.  I have encouraged him to resume his normal walking routine.  Continue Plavix and statin.  Follow-up in 6 months with ABIs and lower extremity arterial duplex.  Encouraged him to call our office sooner should he develop skin breakdown of the lower extremities, rest pain or exercise intolerance.  Barbie Banner,  PA-C [facility] [phone]  Clinic MD:   Trula Slade

## 2020-07-27 ENCOUNTER — Other Ambulatory Visit: Payer: Self-pay

## 2020-07-27 ENCOUNTER — Ambulatory Visit (INDEPENDENT_AMBULATORY_CARE_PROVIDER_SITE_OTHER): Payer: Medicare Other | Admitting: Cardiovascular Disease

## 2020-07-27 ENCOUNTER — Encounter: Payer: Self-pay | Admitting: Cardiovascular Disease

## 2020-07-27 VITALS — BP 156/74 | HR 68 | Ht 67.0 in | Wt 181.0 lb

## 2020-07-27 DIAGNOSIS — E782 Mixed hyperlipidemia: Secondary | ICD-10-CM

## 2020-07-27 DIAGNOSIS — I701 Atherosclerosis of renal artery: Secondary | ICD-10-CM | POA: Diagnosis not present

## 2020-07-27 DIAGNOSIS — I150 Renovascular hypertension: Secondary | ICD-10-CM

## 2020-07-27 DIAGNOSIS — Z951 Presence of aortocoronary bypass graft: Secondary | ICD-10-CM | POA: Diagnosis not present

## 2020-07-27 DIAGNOSIS — I10 Essential (primary) hypertension: Secondary | ICD-10-CM

## 2020-07-27 DIAGNOSIS — I739 Peripheral vascular disease, unspecified: Secondary | ICD-10-CM

## 2020-07-27 NOTE — Assessment & Plan Note (Signed)
History of PAD status post angiography by myself 06/26/2019 revealing a patent left femoropopliteal bypass grafting performed by Dr. Lazaro Arms in [place] remotely.  He had a 90% left renal artery stenosis, 95% distal right common femoral artery stenosis, patent right common iliac artery stent and 80% in-stent restenosis within the previously placed right SFA stent along with a 90% calcified above-the-knee right popliteal artery stenosis.  He underwent right common femoral endarterectomy and patch angioplasty by Dr. Trula Slade.  Subsequent to that he underwent several additional procedures including most recently 06/08/2020 with orbital atherectomy.  He denies claudication.

## 2020-07-27 NOTE — Assessment & Plan Note (Signed)
History of renal artery stenosis demonstrated by angiography 06/26/2019.  His last renal Doppler study performed 10/14/2019 revealed a left renal aortic ratio of 4.5 with a renal dimension of 10.3 cm.  This will be repeated in February of next year.

## 2020-07-27 NOTE — Assessment & Plan Note (Signed)
History of CAD status post CABG x5 by Dr. Cyndia Bent 07/11/2018.  He had an unremarkable recovery.  He denies chest pain or shortness of breath.

## 2020-07-27 NOTE — Assessment & Plan Note (Signed)
History of hyperlipidemia on Repatha and Pravachol with lipid profile performed 04/27/2020 revealing total cholesterol 119, LDL of 23 and HDL of 69.

## 2020-07-27 NOTE — Patient Instructions (Signed)

## 2020-07-27 NOTE — Progress Notes (Signed)
-5    07/27/2020 Ryan Pittman   [birth year]  [id number]  Primary Physician Tonia Ghent, MD Primary Cardiologist: [name] MD [name], [name]  HPI:  Ryan Pittman is a 79 y.o.  mildly overweight married Caucasian male father of 2, grandfather for grandchildren .Ryan Pittman He is referred by Dr. Elsie Stain for cardiovascular evaluation because of chest pain.I last saw him in the office10/15/2020.His risk factors include 50 pack years of tobacco abuse having quit 8 years ago as well as treated hypertension and hyperlipidemia. There is no family history. He is never had a heart attack or stroke. He does complain of some chest pain that began several months ago occurring on a monthly basis lasting minutes at a time. He does have a history of PAD status post left femoropopliteal bypass grafting several years ago by Dr. Sherron Flemings.  He had a positive stress test and subsequent coronary CTA on 05/24/2018 revealing a high calcium score of 2858 with three-vessel calcification. He ultimately underwent cardiac catheterization by myself 10/involving orbital atherectomy./2019 revealing three-vessel disease with preserved LV function. He ultimately underwent CABG x5 by Dr. Cyndia Bent 07/11/2018 and has done well since. Heparticipated incardiac rehab.   Since I saw him in the office 3 months ago he was begun on Repatha we will recheck his lipid liver profile.  He denies chest pain or shortness of breath.  He continues to complain of lifestyle limiting right calf claudication with lower extremity Dopplers performed 08/05/2018 revealing right ABI 0.78 and left 2.90.  A patent left femoropopliteal bypass graft and short segment occlusion distal right common femoral artery and high-grade disease in the proximal right SFA.    I performed peripheral angiography on him 06/26/2019 revealing a 90% left renal artery stenosis, patent left femoropopliteal bypass graft, patent right common  iliac artery stent, 90% calcified right common femoral artery stenosis, 80% in-stent restenosis within the previously placed right SFA stent and 90% calcified right popliteal artery stenosis.  He ultimately underwent right common femoral endarterectomy and patch angioplasty Dr. Trula Slade and had several subsequent procedures including most recently involving orbital atherectomy.  Patient denies chest pain, shortness of breath or claudication.  Current Meds  Medication Sig  . amLODipine (NORVASC) 5 MG tablet Take 5 mg by mouth daily.   . clopidogrel (PLAVIX) 75 MG tablet TAKE 1 TABLET BY MOUTH DAILY (Patient taking differently: Take 75 mg by mouth daily. )  . levothyroxine (SYNTHROID) 75 MCG tablet TAKE 1 TABLET BY MOUTH DAILY EXCEPT ON THURSDAYS TAKE 1.5 TABLETS (Patient taking differently: Take 75 mcg by mouth daily before breakfast. )  . losartan (COZAAR) 100 MG tablet Take 100 mg by mouth daily.   . metoprolol tartrate (LOPRESSOR) 25 MG tablet TAKE 1/2 TABLET TWICE DAILY  . pramipexole (MIRAPEX) 1 MG tablet Take 1 mg by mouth 3 (three) times daily as needed (restless leg).   . pravastatin (PRAVACHOL) 40 MG tablet Take 1 tablet (40 mg total) by mouth daily. Please make an appointment for physical exam prior to further refills.  Ryan Pittman REPATHA SURECLICK 361 MG/ML SOAJ INJECT 140MG  EVERY 14 DAYS (Patient taking differently: Inject 140 mg into the skin every 14 (fourteen) days. )  . tamsulosin (FLOMAX) 0.4 MG CAPS capsule Take 1 capsule (0.4 mg total) by mouth daily.  . traZODone (DESYREL) 100 MG tablet TAKE 1/2-1 TABLET BY MOUTH AT BEDTIME ASNEEDED FOR SLEEP (Patient taking differently: Take 50-100 mg by mouth at bedtime as needed for sleep. )  .  vitamin B-12 (CYANOCOBALAMIN) 1000 MCG tablet Take 1,000 mcg by mouth 4 (four) times a week.     Allergies  Allergen Reactions  . Penicillins Itching and Other (See Comments)    PATIENT HAS HAD A PCN REACTION WITH IMMEDIATE RASH, FACIAL/TONGUE/THROAT SWELLING,  SOB, OR LIGHTHEADEDNESS WITH HYPOTENSION:  #  #  YES  #  #  Has patient had a PCN reaction causing severe rash involving mucus membranes or skin necrosis: No Has patient had a PCN reaction that required hospitalization: No Has patient had a PCN reaction occurring within the last 10 years: No If all of the above answers are "NO", then may proceed with Cephalosporin use.   . Ace Inhibitors Cough  . Aspirin Other (See Comments)    H/o GI bleed  . Celebrex [Celecoxib] Other (See Comments)    GI bleed  . Lipitor [Atorvastatin] Other (See Comments)    myalgias    Social History   Socioeconomic History  . Marital status: Married    Spouse name: Not on file  . Number of children: Not on file  . Years of education: Not on file  . Highest education level: Not on file  Occupational History  . Not on file  Tobacco Use  . Smoking status: Former Smoker    Types: Cigarettes    Quit date: 09/04/2008    Years since quitting: 11.9  . Smokeless tobacco: Former Systems developer    Types: Chew  . Tobacco comment: HAs quit tobacco products 2009  Vaping Use  . Vaping Use: Never used  Substance and Sexual Activity  . Alcohol use: Not Currently    Alcohol/week: 0.0 standard drinks  . Drug use: No  . Sexual activity: Yes  Other Topics Concern  . Not on file  Social History Narrative   Married 50+ years, 2 kids   [place] to play golf   Social Determinants of Health   Financial Resource Strain:   . Difficulty of Paying Living Expenses: Not on file  Food Insecurity:   . Worried About Charity fundraiser in the Last Year: Not on file  . Ran Out of Food in the Last Year: Not on file  Transportation Needs:   . Lack of Transportation (Medical): Not on file  . Lack of Transportation (Non-Medical): Not on file  Physical Activity:   . Days of Exercise per Week: Not on file  . Minutes of Exercise per Session: Not on file  Stress:   . Feeling of Stress : Not on file  Social Connections:    . Frequency of Communication with Friends and Family: Not on file  . Frequency of Social Gatherings with Friends and Family: Not on file  . Attends Religious Services: Not on file  . Active Member of Clubs or Organizations: Not on file  . Attends Archivist Meetings: Not on file  . Marital Status: Not on file  Intimate Partner Violence:   . Fear of Current or Ex-Partner: Not on file  . Emotionally Abused: Not on file  . Physically Abused: Not on file  . Sexually Abused: Not on file     Review of Systems: General: negative for chills, fever, night sweats or weight changes.  Cardiovascular: negative for chest pain, dyspnea on exertion, edema, orthopnea, palpitations, paroxysmal nocturnal dyspnea or shortness of breath Dermatological: negative for rash Respiratory: negative for cough or wheezing Urologic: negative for hematuria Abdominal: negative for nausea, vomiting, diarrhea, bright red blood per  rectum, melena, or hematemesis Neurologic: negative for visual changes, syncope, or dizziness All other systems reviewed and are otherwise negative except as noted above.    Blood pressure (!) 156/74, pulse 68, height 5\' 7"  (1.702 m), weight 181 lb (82.1 kg).  General appearance: alert and no distress Neck: no adenopathy, no carotid bruit, no JVD, supple, symmetrical, trachea midline and thyroid not enlarged, symmetric, no tenderness/mass/nodules Lungs: clear to auscultation bilaterally Heart: regular rate and rhythm, S1, S2 normal, no murmur, click, rub or gallop Extremities: extremities normal, atraumatic, no cyanosis or edema Pulses: 2+ and symmetric Skin: Skin color, texture, turgor normal. No rashes or lesions Neurologic: Alert and oriented X 3, normal strength and tone. Normal symmetric reflexes. Normal coordination and gait  EKG sinus rhythm at 68 without ST or T wave changes.  I personally reviewed this EKG.  ASSESSMENT AND PLAN:   PVD (peripheral vascular disease)  ([place]) History of PAD status post angiography by myself 06/26/2019 revealing a patent left femoropopliteal bypass grafting performed by Dr. Lazaro Arms in [place] remotely.  He had a 90% left renal artery stenosis, 95% distal right common femoral artery stenosis, patent right common iliac artery stent and 80% in-stent restenosis within the previously placed right SFA stent along with a 90% calcified above-the-knee right popliteal artery stenosis.  He underwent right common femoral endarterectomy and patch angioplasty by Dr. Trula Slade.  Subsequent to that he underwent several additional procedures including most recently 06/08/2020 with orbital atherectomy.  He denies claudication.  HTN (hypertension) History of essential hypertension a blood pressure measured today 156/74.  He is on losartan.  Hyperlipidemia History of hyperlipidemia on Repatha and Pravachol with lipid profile performed 04/27/2020 revealing total cholesterol 119, LDL of 23 and HDL of 69.  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  His last renal Doppler study performed 10/14/2019 revealed a left renal aortic ratio of 4.5 with a renal dimension of 10.3 cm.  This will be repeated in February of next year.  Hx of CABG History of CAD status post CABG x5 by Dr. Cyndia Bent 07/11/2018.  He had an unremarkable recovery.  He denies chest pain or shortness of breath.      [name] MD FACP,FACC,FAHA, [facility] 07/27/2020 8:38 AM

## 2020-07-27 NOTE — Assessment & Plan Note (Signed)
History of essential hypertension a blood pressure measured today 156/74.  He is on losartan.

## 2020-09-02 DIAGNOSIS — R809 Proteinuria, unspecified: Secondary | ICD-10-CM | POA: Diagnosis not present

## 2020-09-02 DIAGNOSIS — I10 Essential (primary) hypertension: Secondary | ICD-10-CM | POA: Diagnosis not present

## 2020-09-02 DIAGNOSIS — D631 Anemia in chronic kidney disease: Secondary | ICD-10-CM | POA: Diagnosis not present

## 2020-09-02 DIAGNOSIS — N182 Chronic kidney disease, stage 2 (mild): Secondary | ICD-10-CM | POA: Diagnosis not present

## 2020-09-08 ENCOUNTER — Other Ambulatory Visit: Payer: Self-pay | Admitting: Cardiovascular Disease

## 2020-09-14 ENCOUNTER — Other Ambulatory Visit: Payer: Self-pay | Admitting: Family Medicine

## 2020-09-14 NOTE — Telephone Encounter (Signed)
Please Advise

## 2020-09-15 NOTE — Telephone Encounter (Signed)
Sent. Thanks.   

## 2020-09-20 DIAGNOSIS — Z23 Encounter for immunization: Secondary | ICD-10-CM | POA: Diagnosis not present

## 2020-09-28 ENCOUNTER — Telehealth: Payer: Self-pay | Admitting: Cardiovascular Disease

## 2020-09-28 NOTE — Telephone Encounter (Signed)
Pt c/o of Chest Pain: STAT if CP now or developed within 24 hours  1. Are you having CP right now? No.  2. Are you experiencing any other symptoms (ex. SOB, nausea, vomiting, sweating)? No.  3. How long have you been experiencing CP? Since last night.  4. Is your CP continuous or coming and going? Coming and going.  5. Have you taken Nitroglycerin? No.  Patient wife is calling stating that he woke up out of his sleep last night feeling chest pains and tightness in his chest. She would like him to be seen this week. Please advise. ?

## 2020-09-28 NOTE — Telephone Encounter (Signed)
Spoke with pt who report last night he was awaken by tightness in the middle of his chest. He denies any other symptoms but state symptoms lasted about an hour. Pt voiced CP was similar to ingestion but a little more alarming.   Pt denies currents symptoms but also wanted to report occasional tingling on the left side of his chest.   Appointment scheduled for 1/31 for further evaluations. Pt also voiced understanding to report to ER if symptoms reoccur.

## 2020-10-03 NOTE — Progress Notes (Signed)
Cardiology Clinic Note   Patient Name: Ryan Pittman Date of Encounter: 10/04/2020  Primary Care Provider:  Tonia Ghent, MD Primary Cardiologist:  Ryan Burow, MD  Patient Profile    Ryan Pittman 80 year old male presents the clinic today for evaluation of his chest pain.  Past Medical History    Past Medical History:  Diagnosis Date  . Anemia    due to GIB, s/p transfusion  . Anginal pain (Ryan Pittman) 07/10/2018  . Arthritis    back pain, much worse after consecutive golf rounds  . Cancer ([place])    thyroid  . Chronic kidney disease   . Colon polyps   . Coronary artery disease   . COVID-19   . Dyspnea    walking up a hill  . Dysrhythmia 2020  . GERD (gastroesophageal reflux disease)   . Hypercalcemia    h/o, resolved as of 2012, prev due to high amount of calcium intake  . Hyperlipidemia   . Hypertension   . Hypothyroidism   . IgG gammopathy    stable as of 2012 per [facility]  . Pneumonia    as a child  . PVD (peripheral vascular disease) ([place])    L leg bypass, R leg stented   Past Surgical History:  Procedure Laterality Date  .  dental implant left bottom-did have bleeding     . ABDOMINAL AORTOGRAM W/LOWER EXTREMITY Bilateral 06/26/2019   Procedure: ABDOMINAL AORTOGRAM W/LOWER EXTREMITY;  Surgeon: [name], MD;  Location: [place] [facility];  Service: Cardiovascular;  Laterality: Bilateral;  . ABDOMINAL AORTOGRAM W/LOWER EXTREMITY N/A 06/08/2020   Procedure: ABDOMINAL AORTOGRAM W/LOWER EXTREMITY;  Surgeon: Serafina Mitchell, MD;  Location: [place] [facility];  Service: Cardiovascular;  Laterality: N/A;  . BYPASS GRAFT     L leg  . CARDIAC CATHETERIZATION  06/24/2018  . COLONOSCOPY WITH PROPOFOL N/A 05/04/2015   Procedure: COLONOSCOPY WITH PROPOFOL;  Surgeon: Lollie Sails, MD;  Location: [facility];  Service: Endoscopy;  Laterality: N/A;  . CORONARY ARTERY BYPASS GRAFT N/A 07/11/2018   Procedure: CORONARY ARTERY BYPASS GRAFTING (CABG) x  three, using left internal mammary artery and right leg greater saphenous vein harvested endoscopically;  Surgeon: Gaye Pollack, MD;  Location: [place] [facility];  Service: Open Heart Surgery;  Laterality: N/A;  . ENDARTERECTOMY FEMORAL Right 02/13/2020   Procedure: RIGHT ENDARTERECTOMY FEMORAL WITH BOVINE PATCH;  Surgeon: Serafina Mitchell, MD;  Location: [place];  Service: Vascular;  Laterality: Right;  . LEFT HEART CATH AND CORONARY ANGIOGRAPHY N/A 06/24/2018   Procedure: LEFT HEART CATH AND CORONARY ANGIOGRAPHY;  Surgeon: [name], MD;  Location: [place] [facility];  Service: Cardiovascular;  Laterality: N/A;  . PERIPHERAL VASCULAR ATHERECTOMY Right 06/08/2020   Procedure: PERIPHERAL VASCULAR ATHERECTOMY;  Surgeon: Serafina Mitchell, MD;  Location: [place] [facility];  Service: Cardiovascular;  Laterality: Right;  superficial femoral  . PERIPHERAL VASCULAR BALLOON ANGIOPLASTY Right 06/08/2020   Procedure: PERIPHERAL VASCULAR BALLOON ANGIOPLASTY;  Surgeon: Serafina Mitchell, MD;  Location: [place] [facility];  Service: Cardiovascular;  Laterality: Right;  External iliac  . TEE WITHOUT CARDIOVERSION N/A 07/11/2018   Procedure: TRANSESOPHAGEAL ECHOCARDIOGRAM (TEE);  Surgeon: Gaye Pollack, MD;  Location: [facility];  Service: Open Heart Surgery;  Laterality: N/A;  . THYROIDECTOMY, PARTIAL  2016  . VASCULAR SURGERY      Allergies  Allergies  Allergen Reactions  . Penicillins Itching and Other (See Comments)    PATIENT HAS HAD  A PCN REACTION WITH IMMEDIATE RASH, FACIAL/TONGUE/THROAT SWELLING, SOB, OR LIGHTHEADEDNESS WITH HYPOTENSION:  #  #  YES  #  #  Has patient had a PCN reaction causing severe rash involving mucus membranes or skin necrosis: No Has patient had a PCN reaction that required hospitalization: No Has patient had a PCN reaction occurring within the last 10 years: No If all of the above answers are "NO", then may proceed with Cephalosporin use.   . Ace Inhibitors Cough  . Aspirin Other  (See Comments)    H/o GI bleed  . Celebrex [Celecoxib] Other (See Comments)    GI bleed  . Lipitor [Atorvastatin] Other (See Comments)    myalgias    History of Present Illness    Ryan Pittman has a PMH of PVD, carotid stenosis, hypertension, renal artery stenosis, paroxysmal atrial fibrillation, coronary artery disease, CKD stage III, and peripheral arterial disease.  He underwent coronary CTA 05/24/2018 which showed a calcium score of 2858 with three-vessel calcification.  He underwent cardiac catheterization by Ryan Pittman 2019 which showed three-vessel disease and preserved LVEF.  He had CABG x5 by Ryan Pittman 07/11/2018.  He underwent peripheral angiography 06/26/2019 which showed 90% left renal artery stenosis, patent left femoral popliteal bypass graft patent right common iliac artery stent, 90% calcified right common femoral artery stenosis, 80% in-stent restenosis within the previously placed right SFA stent and 90% calcified right popliteal stenosis.  He underwent right common femoral endarterectomy and patch angioplasty by Ryan Pittman.  He underwent several subsequent procedures including orbital arthrectomy.  He was last seen by Ryan Pittman on 07/27/2020.  During that time he denied chest pain shortness of breath and claudication.  He contacted nurse triage line on 09/28/2020.  During that time he reported being woke up by tightness in the middle of his chest.  He denied any other symptoms.  He reported that symptoms lasted for about an hour.  He felt that the symptoms were similar to indigestion he had previously daily experience.  At the time of the call he denied symptoms but reported occasional tingling in the left side of his chest.  He presents the clinic today for follow-up evaluation states he noticed an episode of chest pressure which he describes as indigestion, that came on and on the morning of 09/28/20-09/29/20.  It was relieved by drinking Sprite and taking Tylenol.  He continues to  be very physically active working full-time and walking around 6000 steps per day.  He does note some shortness of breath when he increases his physical activity such as going up 3 flights of stairs but does not have increased work of breathing with normal activities.  He denies exertional chest pain.  He reports some dietary indiscretion and reports he eats out regularly.  I will give him the salty 6 diet sheet, have him maintain his physical activity, he may take Tums/Gas-X for his indigestion symptoms.  We will see him back for follow-up in 6 months.  No ischemic evaluation planned at this time.  Today he denies chest pain, shortness of breath, lower extremity edema, fatigue, palpitations, melena, hematuria, hemoptysis, diaphoresis, weakness, presyncope, syncope, orthopnea, and PND.   Home Medications    Prior to Admission medications   Medication Sig Start Date End Date Taking? Authorizing Provider  amLODipine (NORVASC) 5 MG tablet Take 5 mg by mouth daily.     [provider]  clopidogrel (PLAVIX) 75 MG tablet TAKE 1 TABLET BY MOUTH DAILY Patient taking differently: Take  75 mg by mouth daily.  10/22/19   Ryan Ghent, MD  levothyroxine (SYNTHROID) 75 MCG tablet TAKE 1 TABLET BY MOUTH DAILY EXCEPT ON THURSDAYS TAKE 1.5 TABLETS Patient taking differently: Take 75 mcg by mouth daily before breakfast.  03/11/20   Ryan Ghent, MD  losartan (COZAAR) 100 MG tablet Take 100 mg by mouth daily.  12/18/19   [provider]  metoprolol tartrate (LOPRESSOR) 25 MG tablet TAKE 1/2 TABLET TWICE DAILY 07/02/20   [name], MD  pramipexole (MIRAPEX) 1 MG tablet Take 1 mg by mouth 3 (three) times daily as needed (restless leg).  11/20/19   [provider]  pravastatin (PRAVACHOL) 40 MG tablet Take 1 tablet (40 mg total) by mouth daily. Please make an appointment for physical exam prior to further refills. 04/16/20   Ryan Ghent, MD  REPATHA SURECLICK [id number] MG/ML SOAJ  INJECT 140MG  EVERY 14 DAYS 09/08/20   [name], MD  tamsulosin (FLOMAX) 0.4 MG CAPS capsule Take 1 capsule (0.4 mg total) by mouth daily. 10/30/19   Abbie Sons, MD  traZODone (DESYREL) 100 MG tablet TAKE 1/2-1 TABLET BY MOUTH AT BEDTIME ASNEEDED FOR SLEEP 09/15/20   Ryan Ghent, MD  vitamin B-12 (CYANOCOBALAMIN) 1000 MCG tablet Take 1,000 mcg by mouth 4 (four) times a week.    [provider]    Family History    Family History  Problem Relation Age of Onset  . Diabetes Mother   . Stroke Father   . Colon cancer Neg Hx   . Prostate cancer Neg Hx    He indicated that his mother is deceased. He indicated that his father is deceased. He indicated that his sister is alive. He indicated that only one of his two brothers is alive. He indicated that the status of his neg hx is unknown.  Social History    Social History   Socioeconomic History  . Marital status: Married    Spouse name: Not on file  . Number of children: Not on file  . Years of education: Not on file  . Highest education level: Not on file  Occupational History  . Not on file  Tobacco Use  . Smoking status: Former Smoker    Types: Cigarettes    Quit date: 09/04/2008    Years since quitting: 12.0  . Smokeless tobacco: Former Systems developer    Types: Chew  . Tobacco comment: HAs quit tobacco products 2009  Vaping Use  . Vaping Use: Never used  Substance and Sexual Activity  . Alcohol use: Not Currently    Alcohol/week: 0.0 standard drinks  . Drug use: No  . Sexual activity: Yes  Other Topics Concern  . Not on file  Social History Narrative   Married 50+ years, 2 kids   Runs [employer]   Likes to play golf   Social Determinants of Health   Financial Resource Strain: Not on file  Food Insecurity: Not on file  Transportation Needs: Not on file  Physical Activity: Not on file  Stress: Not on file  Social Connections: Not on file  Intimate Partner Violence: Not on file     Review of  Systems    General:  No chills, fever, night sweats or weight changes.  Cardiovascular:  No chest pain, dyspnea on exertion, edema, orthopnea, palpitations, paroxysmal nocturnal dyspnea. Dermatological: No rash, lesions/masses Respiratory: No cough, dyspnea Urologic: No hematuria, dysuria Abdominal:   No nausea, vomiting, diarrhea, bright red blood  per rectum, melena, or hematemesis Neurologic:  No visual changes, wkns, changes in mental status. All other systems reviewed and are otherwise negative except as noted above.  Physical Exam    VS:  BP (!) 156/80 (BP Location: Left Arm, Patient Position: Sitting, Cuff Size: Normal)   Pulse 66   Ht 5\' 7"  (1.702 m)   Wt 164 lb (74.4 kg)   BMI 25.69 kg/m  , BMI Body mass index is 25.69 kg/m. GEN: Well nourished, well developed, in no acute distress. HEENT: normal. Neck: Supple, no JVD, carotid bruits, or masses. Cardiac: RRR, no murmurs, rubs, or gallops. No clubbing, cyanosis, edema.  Radials/DP/PT 2+ and equal bilaterally.  Respiratory:  Respirations regular and unlabored, clear to auscultation bilaterally. GI: Soft, nontender, nondistended, BS + x 4. MS: no deformity or atrophy. Skin: warm and dry, no rash. Neuro:  Strength and sensation are intact. Psych: Normal affect.  Accessory Clinical Findings    Recent Labs: 02/03/2020: ALT 16 04/08/2020: Platelets 215.0; TSH 4.04 06/08/2020: BUN 20; Creatinine, Ser 1.20; Hemoglobin 11.9; Potassium 4.2; Sodium 141   Recent Lipid Panel    Component Value Date/Time   CHOL 119 04/27/2020 0000   CHOL 227 11/15/2012 0000   TRIG 209 (A) 04/27/2020 0000   HDL 64.00 04/17/2019 0821   HDL 71 10/23/2018 0853   CHOLHDL 3 04/17/2019 0821   VLDL 36.2 04/17/2019 0821   LDLCALC 23 04/27/2020 0000   LDLDIRECT 112.0 03/27/2018 1035    ECG personally reviewed by me today-normal sinus rhythm 66 bpm no ST or T wave deviation- No acute changes  EKG 07/27/2020 Normal sinus rhythm no ST or T wave  deviation 68 bpm  Echocardiogram 08/12/2019 IMPRESSIONS    1. Left ventricular ejection fraction, by visual estimation, is 55 to  60%. The left ventricle has normal function. There is moderately increased  left ventricular hypertrophy.  2. Elevated left atrial pressure.  3. Left ventricular diastolic parameters are consistent with Grade I  diastolic dysfunction (impaired relaxation).  4. The left ventricle has no regional wall motion abnormalities.  5. Global right ventricle has normal systolic function.The right  ventricular size is mildly enlarged. No increase in right ventricular wall  thickness.  6. Left atrial size was mildly dilated.  7. Right atrial size was mildly dilated.  8. The pericardium was not well visualized.  9. The mitral valve is degenerative. Mild mitral valve regurgitation.  10. The tricuspid valve is normal in structure. Tricuspid valve  regurgitation is trivial.  11. The aortic valve is tricuspid. Aortic valve regurgitation is not  visualized. 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  12. The pulmonic valve was not well visualized. Pulmonic valve  regurgitation is not visualized.  13. The interatrial septum was not well visualized.  Assessment & Plan   1.  Atypical chest pain-no chest pain today.  Reports 1-2 hour of chest pain night of 09/28/2020.  Describes pain as indigestion which was relieved by drinking Sprite and taking Tylenol.  Denies exertional chest pain or discomfort.  No increased DOE or activity intolerance.  No arm, neck, back or throat pain.  EKG shows normal sinus rhythm 66 bpm no ST or T wave deviation.   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  Well-controlled at home 130s over 80s.  History of renal artery stenosis renal Doppler study to 921 showed left renal aortic ratio of 4.5 with a renal  dimension of  10.3. Repeat renal Doppler study 3/21 Continue amlodipine, losartan, metoprolol Heart healthy low-sodium diet-salty 6 given Increase physical activity as tolerated Maintain blood pressure log and bring to next appointment  Hyperlipidemia-04/27/2020: Cholesterol 119; LDL (calc) 23; Triglycerides 209 Continue Repatha, pravastatin Heart healthy low-sodium diet-salty 6 given Increase physical activity as tolerated  Coronary artery disease-no chest pain today.  CABG x511/2019 Continue Repatha, pravastatin Heart healthy low-sodium diet-salty 6 given Increase physical activity as tolerated  Peripheral vascular disease-does not report claudication. Continue Repatha, pravastatin Heart healthy low-sodium diet-salty 6 given Increase physical activity as tolerated  Disposition: Follow-up with Ryan Pittman or me in 6 months.  Jossie Ng. Kordae Buonocore NP-C    10/04/2020, 8:28 AM [address] Office [phone] Fax [phone]  Notice: This dictation was prepared with Dragon dictation along with smaller phrase technology. Any transcriptional errors that result from this process are unintentional and may not be corrected upon review.  I spent 15 minutes examining this patient, reviewing medications, and using patient centered shared decision making involving her cardiac care.  Prior to her visit I spent greater than 20 minutes reviewing her past medical history,  medications, and prior cardiac tests.

## 2020-10-04 ENCOUNTER — Encounter: Payer: Self-pay | Admitting: General Practice

## 2020-10-04 ENCOUNTER — Ambulatory Visit (INDEPENDENT_AMBULATORY_CARE_PROVIDER_SITE_OTHER): Payer: Medicare Other | Admitting: General Practice

## 2020-10-04 ENCOUNTER — Other Ambulatory Visit: Payer: Self-pay

## 2020-10-04 VITALS — BP 156/80 | HR 66 | Ht 67.0 in | Wt 164.0 lb

## 2020-10-04 DIAGNOSIS — I150 Renovascular hypertension: Secondary | ICD-10-CM | POA: Diagnosis not present

## 2020-10-04 DIAGNOSIS — I739 Peripheral vascular disease, unspecified: Secondary | ICD-10-CM

## 2020-10-04 DIAGNOSIS — E782 Mixed hyperlipidemia: Secondary | ICD-10-CM | POA: Diagnosis not present

## 2020-10-04 DIAGNOSIS — Z951 Presence of aortocoronary bypass graft: Secondary | ICD-10-CM

## 2020-10-04 DIAGNOSIS — R0789 Other chest pain: Secondary | ICD-10-CM | POA: Diagnosis not present

## 2020-10-04 NOTE — Patient Instructions (Addendum)
Medication Instructions:  The current medical regimen is effective;  continue present plan and medications as directed. Please refer to the Current Medication list given to you today.  *If you need a refill on your cardiac medications before your next appointment, please call your pharmacy*  Lab Work:   Testing/Procedures:  NONE    SCHEDULE RENAL US IN [facility]  Special Instructions MAY USE OTC TUMS, GASES, ETC...WHEN NEEDED  PLEASE READ AND FOLLOW SALTY 6-ATTACHED-1,800mg  daily  PLEASE MAINTAIN PHYSICAL ACTIVITY AS TOLERATED  Follow-Up: Your next appointment:  6 month(s) In Person with [name], MD OR IF UNAVAILABLE [place], FNP-C   At [facility], you and your health needs are our priority.  As part of our continuing mission to provide you with exceptional heart care, we have created designated Provider Care Teams.  These Care Teams include your primary Cardiologist (physician) and Advanced Practice Providers (APPs -  Physician Assistants and Nurse Practitioners) who all work together to provide you with the care you need, when you need it.            6 SALTY THINGS TO AVOID     1,800MG  DAILY

## 2020-10-04 NOTE — Addendum Note (Signed)
Addended by: Waylan Rocher on: 10/04/2020 11:23 AM   Modules accepted: Orders

## 2020-10-07 DIAGNOSIS — D472 Monoclonal gammopathy: Secondary | ICD-10-CM | POA: Diagnosis not present

## 2020-10-14 ENCOUNTER — Inpatient Hospital Stay (HOSPITAL_COMMUNITY): Admission: RE | Admit: 2020-10-14 | Payer: Medicare Other | Source: Ambulatory Visit

## 2020-10-18 ENCOUNTER — Encounter (HOSPITAL_COMMUNITY): Payer: Medicare Other

## 2020-10-19 ENCOUNTER — Other Ambulatory Visit (HOSPITAL_COMMUNITY): Payer: Self-pay | Admitting: Cardiovascular Disease

## 2020-10-19 ENCOUNTER — Other Ambulatory Visit: Payer: Self-pay

## 2020-10-19 ENCOUNTER — Ambulatory Visit (HOSPITAL_COMMUNITY)
Admission: RE | Admit: 2020-10-19 | Discharge: 2020-10-19 | Disposition: A | Discharge status: Home / Self Care | Payer: Medicare Other | Source: Ambulatory Visit | Attending: Cardiology | Admitting: Cardiology

## 2020-10-19 DIAGNOSIS — I701 Atherosclerosis of renal artery: Secondary | ICD-10-CM | POA: Insufficient documentation

## 2020-10-29 ENCOUNTER — Ambulatory Visit: Payer: Self-pay | Admitting: Urology

## 2020-11-03 ENCOUNTER — Telehealth: Payer: Self-pay | Admitting: Family Medicine

## 2020-11-03 ENCOUNTER — Other Ambulatory Visit: Payer: Self-pay | Admitting: Urology

## 2020-11-03 NOTE — Telephone Encounter (Signed)
Patient LOV was 09/04/2019; LMTCB to schedule OV. Would you like to refill in the meantime?

## 2020-11-03 NOTE — Telephone Encounter (Signed)
Reasonable for yearly follow-up this spring when possible.  Please schedule.  Thanks.  Rx sent

## 2020-11-04 NOTE — Telephone Encounter (Signed)
I got him scheduled for 4/28 at 1030 and he wants same day labs

## 2020-11-05 ENCOUNTER — Ambulatory Visit (INDEPENDENT_AMBULATORY_CARE_PROVIDER_SITE_OTHER): Payer: Medicare Other | Admitting: Urology

## 2020-11-05 ENCOUNTER — Encounter: Payer: Self-pay | Admitting: Urology

## 2020-11-05 ENCOUNTER — Other Ambulatory Visit: Payer: Self-pay

## 2020-11-05 VITALS — BP 159/74 | HR 79 | Ht 67.0 in | Wt 155.0 lb

## 2020-11-05 DIAGNOSIS — N401 Enlarged prostate with lower urinary tract symptoms: Secondary | ICD-10-CM

## 2020-11-05 LAB — BLADDER SCAN AMB NON-IMAGING: Scan Result: 34

## 2020-11-05 MED ORDER — TAMSULOSIN HCL 0.4 MG PO CAPS
0.4000 mg | ORAL_CAPSULE | Freq: Every day | ORAL | 3 refills | Status: DC
Start: 2020-11-05 — End: 2021-11-07

## 2020-11-05 NOTE — Progress Notes (Signed)
11/05/2020 10:49 AM   [name] December 08, 1940 [id number]  Referring provider: Tonia Ghent, MD [address] [place],  [place] [address]  Chief Complaint  Patient presents with  . Benign Prostatic Hypertrophy     Urologic history: 1.  BPH with LUTS -Bothersome lower urinary tract symptoms 09/2018; PVR 119 mL -Started tamsulosin with improvement  HPI: 80 y.o. male presents for annual follow-up.   Stable LUTS since last years visit; occasional postvoid dribbling  Remains on tamsulosin  Denies dysuria, gross hematuria  Denies flank, abdominal or pelvic pain   PMH: Past Medical History:  Diagnosis Date  . Anemia    due to GIB, s/p transfusion  . Anginal pain ([place]) 07/10/2018  . Arthritis    back pain, much worse after consecutive golf rounds  . Cancer ([place])    thyroid  . Chronic kidney disease   . Colon polyps   . Coronary artery disease   . COVID-19   . Dyspnea    walking up a hill  . Dysrhythmia 2020  . GERD (gastroesophageal reflux disease)   . Hypercalcemia    h/o, resolved as of 2012, prev due to high amount of calcium intake  . Hyperlipidemia   . Hypertension   . Hypothyroidism   . IgG gammopathy    stable as of 2012 per [facility]  . Pneumonia    as a child  . PVD (peripheral vascular disease) ([place])    L leg bypass, R leg stented    Surgical History: Past Surgical History:  Procedure Laterality Date  .  dental implant left bottom-did have bleeding     . ABDOMINAL AORTOGRAM W/LOWER EXTREMITY Bilateral 06/26/2019   Procedure: ABDOMINAL AORTOGRAM W/LOWER EXTREMITY;  Surgeon: [name], MD;  Location: [facility];  Service: Cardiovascular;  Laterality: Bilateral;  . ABDOMINAL AORTOGRAM W/LOWER EXTREMITY N/A 06/08/2020   Procedure: ABDOMINAL AORTOGRAM W/LOWER EXTREMITY;  Surgeon: Serafina Mitchell, MD;  Location: [facility];  Service: Cardiovascular;  Laterality: N/A;  . BYPASS GRAFT     L leg  . CARDIAC  CATHETERIZATION  06/24/2018  . COLONOSCOPY WITH PROPOFOL N/A 05/04/2015   Procedure: COLONOSCOPY WITH PROPOFOL;  Surgeon: Lollie Sails, MD;  Location: [facility];  Service: Endoscopy;  Laterality: N/A;  . CORONARY ARTERY BYPASS GRAFT N/A 07/11/2018   Procedure: CORONARY ARTERY BYPASS GRAFTING (CABG) x three, using left internal mammary artery and right leg greater saphenous vein harvested endoscopically;  Surgeon: Gaye Pollack, MD;  Location: [facility];  Service: Open Heart Surgery;  Laterality: N/A;  . ENDARTERECTOMY FEMORAL Right 02/13/2020   Procedure: RIGHT ENDARTERECTOMY FEMORAL WITH BOVINE PATCH;  Surgeon: Serafina Mitchell, MD;  Location: [facility];  Service: Vascular;  Laterality: Right;  . LEFT HEART CATH AND CORONARY ANGIOGRAPHY N/A 06/24/2018   Procedure: LEFT HEART CATH AND CORONARY ANGIOGRAPHY;  Surgeon: [name], MD;  Location: [facility];  Service: Cardiovascular;  Laterality: N/A;  . PERIPHERAL VASCULAR ATHERECTOMY Right 06/08/2020   Procedure: PERIPHERAL VASCULAR ATHERECTOMY;  Surgeon: Serafina Mitchell, MD;  Location: [facility];  Service: Cardiovascular;  Laterality: Right;  superficial femoral  . PERIPHERAL VASCULAR BALLOON ANGIOPLASTY Right 06/08/2020   Procedure: PERIPHERAL VASCULAR BALLOON ANGIOPLASTY;  Surgeon: Serafina Mitchell, MD;  Location: [facility];  Service: Cardiovascular;  Laterality: Right;  External iliac  . TEE WITHOUT CARDIOVERSION N/A 07/11/2018   Procedure: TRANSESOPHAGEAL ECHOCARDIOGRAM (TEE);  Surgeon: Gaye Pollack, MD;  Location: [facility];  Service: Open Heart Surgery;  Laterality: N/A;  . THYROIDECTOMY, PARTIAL  2016  . VASCULAR SURGERY      Home Medications:  Allergies as of 11/05/2020      Reactions   Penicillins Itching, Other (See Comments)   PATIENT HAS HAD A PCN REACTION WITH IMMEDIATE RASH, FACIAL/TONGUE/THROAT SWELLING, SOB, OR LIGHTHEADEDNESS WITH HYPOTENSION:  #  #  YES  #  #  Has patient had a PCN reaction  causing severe rash involving mucus membranes or skin necrosis: No Has patient had a PCN reaction that required hospitalization: No Has patient had a PCN reaction occurring within the last 10 years: No If all of the above answers are "NO", then may proceed with Cephalosporin use.   Ace Inhibitors Cough   Aspirin Other (See Comments)   H/o GI bleed   Celebrex [celecoxib] Other (See Comments)   GI bleed   Lipitor [atorvastatin] Other (See Comments)   myalgias      Medication List       Accurate as of November 05, 2020 10:49 AM. If you have any questions, ask your nurse or doctor.        amLODipine 5 MG tablet Commonly known as: NORVASC Take 5 mg by mouth daily.   clopidogrel 75 MG tablet Commonly known as: PLAVIX TAKE 1 TABLET BY MOUTH DAILY   levothyroxine 75 MCG tablet Commonly known as: SYNTHROID TAKE 1 TABLET BY MOUTH DAILY EXCEPT ON THURSDAYS TAKE 1.5 TABLETS What changed:   how much to take  how to take this  when to take this  additional instructions   losartan 100 MG tablet Commonly known as: COZAAR Take 100 mg by mouth daily.   metoprolol tartrate 25 MG tablet Commonly known as: LOPRESSOR TAKE 1/2 TABLET TWICE DAILY   pramipexole 1 MG tablet Commonly known as: MIRAPEX Take 1 mg by mouth 3 (three) times daily as needed (restless leg).   pravastatin 40 MG tablet Commonly known as: PRAVACHOL TAKE ONE TABLET BY MOUTH EVERY DAY   Repatha SureClick 161 MG/ML Soaj Generic drug: Evolocumab INJECT 140MG  EVERY 14 DAYS   tamsulosin 0.4 MG Caps capsule Commonly known as: FLOMAX Take 1 capsule (0.4 mg total) by mouth daily.   traZODone 100 MG tablet Commonly known as: DESYREL TAKE 1/2-1 TABLET BY MOUTH AT BEDTIME ASNEEDED FOR SLEEP   vitamin B-12 1000 MCG tablet Commonly known as: CYANOCOBALAMIN Take 1,000 mcg by mouth 4 (four) times a week.       Allergies:  Allergies  Allergen Reactions  . Penicillins Itching and Other (See Comments)    PATIENT  HAS HAD A PCN REACTION WITH IMMEDIATE RASH, FACIAL/TONGUE/THROAT SWELLING, SOB, OR LIGHTHEADEDNESS WITH HYPOTENSION:  #  #  YES  #  #  Has patient had a PCN reaction causing severe rash involving mucus membranes or skin necrosis: No Has patient had a PCN reaction that required hospitalization: No Has patient had a PCN reaction occurring within the last 10 years: No If all of the above answers are "NO", then may proceed with Cephalosporin use.   . Ace Inhibitors Cough  . Aspirin Other (See Comments)    H/o GI bleed  . Celebrex [Celecoxib] Other (See Comments)    GI bleed  . Lipitor [Atorvastatin] Other (See Comments)    myalgias    Family History: Family History  Problem Relation Age of Onset  . Diabetes Mother   . Stroke Father   . Colon cancer Neg Hx   .  Prostate cancer Neg Hx     Social History:  reports that he quit smoking about 12 years ago. His smoking use included cigarettes. He has quit using smokeless tobacco.  His smokeless tobacco use included chew. He reports previous alcohol use. He reports that he does not use drugs.   Physical Exam: BP (!) 159/74   Pulse 79   Ht 5\' 7"  (1.702 m)   Wt 155 lb (70.3 kg)   BMI 24.28 kg/m   Constitutional:  Alert and oriented, No acute distress. HEENT: [place] AT, moist mucus membranes.  Trachea midline, no masses. Cardiovascular: No clubbing, cyanosis, or edema. Respiratory: Normal respiratory effort, no increased work of breathing. Neurologic: Grossly intact, no focal deficits, moving all 4 extremities. Psychiatric: Normal mood and affect.   Assessment & Plan:    1. Benign prostatic hyperplasia with lower urinary tract symptoms  Bladder scan PVR 39 mL  Stable LUTS on tamsulosin  Refill sent to pharmacy  Continue annual follow-up    Abbie Sons, MD  [facility] [address], [place], [place] [address] [phone]

## 2020-11-27 ENCOUNTER — Other Ambulatory Visit: Payer: Self-pay | Admitting: Family Medicine

## 2020-12-21 ENCOUNTER — Other Ambulatory Visit: Payer: Self-pay

## 2020-12-21 ENCOUNTER — Ambulatory Visit (INDEPENDENT_AMBULATORY_CARE_PROVIDER_SITE_OTHER): Payer: Medicare Other

## 2020-12-21 DIAGNOSIS — Z0000 Encounter for general adult medical examination without abnormal findings: Secondary | ICD-10-CM

## 2020-12-21 NOTE — Progress Notes (Signed)
Subjective:   Ryan Pittman is a 80 y.o. male who presents for Medicare Annual/Subsequent preventive examination.  Review of Systems: N/A      I connected with the patient today by telephone and verified that I am speaking with the correct person using two identifiers. Location patient: home Location nurse: work Persons participating in the telephone visit: patient, nurse.   I discussed the limitations, risks, security and privacy concerns of performing an evaluation and management service by telephone and the availability of in person appointments. I also discussed with the patient that there may be a patient responsible charge related to this service. The patient expressed understanding and verbally consented to this telephonic visit.        Cardiac Risk Factors include: advanced age (>72men, >4 women);hypertension;male gender;Other (see comment), Risk factor comments: hyperlipidemia     Objective:    Today's Vitals   There is no height or weight on file to calculate BMI.  Advanced Directives 12/21/2020 06/08/2020 02/13/2020 02/03/2020 01/26/2020 08/12/2019 08/11/2019  Does Patient Have a Medical Advance Directive? No No No No Yes No No  Type of Advance Directive - - - - [place] - -  Does patient want to make changes to medical advance directive? - - No - Patient declined No - Patient declined No - Patient declined - -  Would patient like information on creating a medical advance directive? No - Patient declined No - Patient declined No - Patient declined - - No - Patient declined No - Patient declined    Current Medications (verified) Outpatient Encounter Medications as of 12/21/2020  Medication Sig  . amLODipine (NORVASC) 5 MG tablet Take 5 mg by mouth daily.   . clopidogrel (PLAVIX) 75 MG tablet TAKE 1 TABLET BY MOUTH DAILY  . levothyroxine (SYNTHROID) 75 MCG tablet TAKE 1 TABLET BY MOUTH DAILY EXCEPT ON THURSDAYS TAKE 1.5 TABLETS (Patient taking  differently: Take 75 mcg by mouth daily before breakfast.)  . losartan (COZAAR) 100 MG tablet Take 100 mg by mouth daily.   . metoprolol tartrate (LOPRESSOR) 25 MG tablet TAKE 1/2 TABLET TWICE DAILY  . pramipexole (MIRAPEX) 1 MG tablet Take 1 mg by mouth 3 (three) times daily as needed (restless leg).   . pravastatin (PRAVACHOL) 40 MG tablet TAKE ONE TABLET BY MOUTH EVERY DAY  . REPATHA SURECLICK 235 MG/ML SOAJ INJECT 140MG  EVERY 14 DAYS  . tamsulosin (FLOMAX) 0.4 MG CAPS capsule Take 1 capsule (0.4 mg total) by mouth daily.  . vitamin B-12 (CYANOCOBALAMIN) 1000 MCG tablet Take 1,000 mcg by mouth 4 (four) times a week.  . traZODone (DESYREL) 100 MG tablet TAKE 1/2-1 TABLET BY MOUTH AT BEDTIME ASNEEDED FOR SLEEP (Patient not taking: Reported on 12/21/2020)   No facility-administered encounter medications on file as of 12/21/2020.    Allergies (verified) Penicillins, Ace inhibitors, Aspirin, Celebrex [celecoxib], and Lipitor [atorvastatin]   History: Past Medical History:  Diagnosis Date  . Anemia    due to GIB, s/p transfusion  . Anginal pain ([place]) 07/10/2018  . Arthritis    back pain, much worse after consecutive golf rounds  . Cancer ([place])    thyroid  . Chronic kidney disease   . Colon polyps   . Coronary artery disease   . COVID-19   . Dyspnea    walking up a hill  . Dysrhythmia 2020  . GERD (gastroesophageal reflux disease)   . Hypercalcemia    h/o, resolved as of 2012, prev due to  high amount of calcium intake  . Hyperlipidemia   . Hypertension   . Hypothyroidism   . IgG gammopathy    stable as of 2012 per [facility]  . Pneumonia    as a child  . PVD (peripheral vascular disease) ([place])    L leg bypass, R leg stented   Past Surgical History:  Procedure Laterality Date  .  dental implant left bottom-did have bleeding     . ABDOMINAL AORTOGRAM W/LOWER EXTREMITY Bilateral 06/26/2019   Procedure: ABDOMINAL AORTOGRAM W/LOWER EXTREMITY;  Surgeon: [name], MD;   Location: [facility];  Service: Cardiovascular;  Laterality: Bilateral;  . ABDOMINAL AORTOGRAM W/LOWER EXTREMITY N/A 06/08/2020   Procedure: ABDOMINAL AORTOGRAM W/LOWER EXTREMITY;  Surgeon: Serafina Mitchell, MD;  Location: [facility];  Service: Cardiovascular;  Laterality: N/A;  . BYPASS GRAFT     L leg  . CARDIAC CATHETERIZATION  06/24/2018  . COLONOSCOPY WITH PROPOFOL N/A 05/04/2015   Procedure: COLONOSCOPY WITH PROPOFOL;  Surgeon: Lollie Sails, MD;  Location: [facility] ENDOSCOPY;  Service: Endoscopy;  Laterality: N/A;  . CORONARY ARTERY BYPASS GRAFT N/A 07/11/2018   Procedure: CORONARY ARTERY BYPASS GRAFTING (CABG) x three, using left internal mammary artery and right leg greater saphenous vein harvested endoscopically;  Surgeon: Gaye Pollack, MD;  Location: [facility];  Service: Open Heart Surgery;  Laterality: N/A;  . ENDARTERECTOMY FEMORAL Right 02/13/2020   Procedure: RIGHT ENDARTERECTOMY FEMORAL WITH BOVINE PATCH;  Surgeon: Serafina Mitchell, MD;  Location: [facility];  Service: Vascular;  Laterality: Right;  . LEFT HEART CATH AND CORONARY ANGIOGRAPHY N/A 06/24/2018   Procedure: LEFT HEART CATH AND CORONARY ANGIOGRAPHY;  Surgeon: [name], MD;  Location: [facility];  Service: Cardiovascular;  Laterality: N/A;  . PERIPHERAL VASCULAR ATHERECTOMY Right 06/08/2020   Procedure: PERIPHERAL VASCULAR ATHERECTOMY;  Surgeon: Serafina Mitchell, MD;  Location: [facility];  Service: Cardiovascular;  Laterality: Right;  superficial femoral  . PERIPHERAL VASCULAR BALLOON ANGIOPLASTY Right 06/08/2020   Procedure: PERIPHERAL VASCULAR BALLOON ANGIOPLASTY;  Surgeon: Serafina Mitchell, MD;  Location: [facility];  Service: Cardiovascular;  Laterality: Right;  External iliac  . TEE WITHOUT CARDIOVERSION N/A 07/11/2018   Procedure: TRANSESOPHAGEAL ECHOCARDIOGRAM (TEE);  Surgeon: Gaye Pollack, MD;  Location: [facility];  Service: Open Heart Surgery;  Laterality: N/A;  .  THYROIDECTOMY, PARTIAL  2016  . VASCULAR SURGERY     Family History  Problem Relation Age of Onset  . Diabetes Mother   . Stroke Father   . Colon cancer Neg Hx   . Prostate cancer Neg Hx    Social History   Socioeconomic History  . Marital status: Married    Spouse name: Not on file  . Number of children: Not on file  . Years of education: Not on file  . Highest education level: Not on file  Occupational History  . Not on file  Tobacco Use  . Smoking status: Former Smoker    Types: Cigarettes    Quit date: 09/04/2008    Years since quitting: 12.3  . Smokeless tobacco: Former Systems developer    Types: Chew  . Tobacco comment: HAs quit tobacco products 2009  Vaping Use  . Vaping Use: Never used  Substance and Sexual Activity  . Alcohol use: Not Currently    Alcohol/week: 0.0 standard drinks  . Drug use: No  . Sexual activity: Yes  Other Topics Concern  . Not on file  Social History Narrative   Married 50+ years, 2 kids   Runs Nurse, adult   Likes to play golf   Social Determinants of Health   Financial Resource Strain: Low Risk   . Difficulty of Paying Living Expenses: Not hard at all  Food Insecurity: No Food Insecurity  . Worried About Charity fundraiser in the Last Year: Never true  . Ran Out of Food in the Last Year: Never true  Transportation Needs: No Transportation Needs  . Lack of Transportation (Medical): No  . Lack of Transportation (Non-Medical): No  Physical Activity: Inactive  . Days of Exercise per Week: 0 days  . Minutes of Exercise per Session: 0 min  Stress: No Stress Concern Present  . Feeling of Stress : Not at all  Social Connections: Not on file    Tobacco Counseling Counseling given: Not Answered Comment: HAs quit tobacco products 2009   Clinical Intake:  Pre-visit preparation completed: Yes  Pain : No/denies pain     Diabetes: No  How often do you need to have someone help you when you read instructions, pamphlets, or other written  materials from your doctor or pharmacy?: 1 - Never  Diabetic: No Nutrition Risk Assessment:  Has the patient had any N/V/D within the last 2 months?  No  Does the patient have any non-healing wounds?  No  Has the patient had any unintentional weight loss or weight gain?  No   Diabetes:  Is the patient diabetic?  No  If diabetic, was a CBG obtained today?  N/A Did the patient bring in their glucometer from home?  N/A How often do you monitor your CBG's? N/A.   Financial Strains and Diabetes Management:  Are you having any financial strains with the device, your supplies or your medication? N/A.  Does the patient want to be seen by Chronic Care Management for management of their diabetes?  N/A Would the patient like to be referred to a Nutritionist or for Diabetic Management?  N/A  Interpreter Needed?: No  Information entered by :: CJohnson, LPN   Activities of Daily Living In your present state of health, do you have any difficulty performing the following activities: 12/21/2020 02/13/2020  Hearing? N Y  Vision? N N  Difficulty concentrating or making decisions? N N  Walking or climbing stairs? N N  Dressing or bathing? N N  Doing errands, shopping? N N  Preparing Food and eating ? N -  Using the Toilet? N -  In the past six months, have you accidently leaked urine? Y -  Comment some leakage at times -  Do you have problems with loss of bowel control? N -  Managing your Medications? N -  Managing your Finances? N -  Housekeeping or managing your Housekeeping? N -  Some recent data might be hidden    Patient Care Team: Tonia Ghent, MD as PCP - General (Family Medicine) [name], MD as PCP - Cardiology (Cardiology) Lorelee Cover., MD as Consulting Physician (Ophthalmology) Lucky Cowboy, Erskine Squibb, MD as Referring Physician (Vascular Surgery) Dagoberto Ligas, MD as Referring Physician (Internal Medicine) Ronda Fairly. Elenor Quinones, MD as Consulting Physician (Urology) Macario Carls, MS, CCC-A as Consulting Physician (Audiology) Lanier Clam, MD (Endocrinology) Ottie Glazier, MD as Consulting Physician (Pulmonary Disease) Marilynn Rail Jossie Ng, NP as Nurse Practitioner (Cardiology)  Indicate any recent Medical Services you may have received from other than [facility] providers in the past year (date may be approximate).  Assessment:   This is a routine wellness examination for Stacy.  Hearing/Vision screen  Hearing Screening   125Hz  250Hz  500Hz  1000Hz  2000Hz  3000Hz  4000Hz  6000Hz  8000Hz   Right ear:           Left ear:           Vision Screening Comments: Patient gets annual eye exams   Dietary issues and exercise activities discussed: Current Exercise Habits: Home exercise routine, Type of exercise: strength training/weights, Time (Minutes): 15, Frequency (Times/Week): 4, Weekly Exercise (Minutes/Week): 60, Intensity: Moderate, Exercise limited by: None identified  Goals    .  Increase physical activity (pt-stated)      Starting 03/27/2018, I will continue to golf for at least 4 hrs 1-2 days per week and limit intake of saturated fat.     .  Patient Stated      04/04/2019, no goals    .  Patient Stated      12/21/2020, I will continue to do strength training at home for about 15 minutes 3-4 days a week.       Depression Screen PHQ 2/9 Scores 12/21/2020 04/25/2019 04/04/2019 09/23/2018 03/27/2018 03/22/2017 12/24/2015  PHQ - 2 Score 0 0 0 0 0 0 0  PHQ- 9 Score 0 - 0 1 0 - -    Fall Risk Fall Risk  12/21/2020 04/25/2019 04/04/2019 03/27/2018 03/30/2017  Falls in the past year? 0 0 0 No No  Comment - - - - [facility] Telephone Survey: data to providers prior to load  Number falls in past yr: 0 - - - -  Injury with Fall? 0 - - - -  Risk for fall due to : Medication side effect - Medication side effect - -  Follow up Falls evaluation completed;Falls prevention discussed - Falls evaluation completed;Falls prevention discussed - -    FALL RISK PREVENTION PERTAINING  TO THE HOME:  Any stairs in or around the home? Yes  If so, are there any without handrails? No  Home free of loose throw rugs in walkways, pet beds, electrical cords, etc? Yes  Adequate lighting in your home to reduce risk of falls? Yes   ASSISTIVE DEVICES UTILIZED TO PREVENT FALLS:  Life alert? No  Use of a cane, walker or w/c? No  Grab bars in the bathroom? No  Shower chair or bench in shower? No  Elevated toilet seat or a handicapped toilet? No   TIMED UP AND GO:  Was the test performed? N/A telephone visit .    Cognitive Function: MMSE - Mini Mental State Exam 12/21/2020 04/04/2019 03/27/2018 03/22/2017 12/24/2015  Orientation to time 5 4 5 5 5   Orientation to Place 5 5 5 5 5   Registration 3 3 3 3 3   Attention/ Calculation 5 0 0 0 0  Recall 3 3 3 3 3   Language- name 2 objects - 0 0 0 0  Language- repeat 1 0 1 1 1   Language- follow 3 step command - 0 3 3 3   Language- read & follow direction - 0 0 0 0  Write a sentence - 0 0 0 0  Copy design - 0 0 0 0  Total score - 15 20 20 20   Mini Cog  Mini-Cog screen was completed. Maximum score is 22. A value of 0 denotes this part of the MMSE was not completed or the patient failed this part of the Mini-Cog screening.       Immunizations Immunization History  Administered Date(s) Administered  .  Influenza, High Dose Seasonal PF 05/19/2016, 05/19/2019, 06/01/2020  . Influenza-Unspecified 06/18/2014, 07/02/2017, 06/12/2018  . Pneumococcal Conjugate-13 04/07/2014  . Pneumococcal Polysaccharide-23 04/28/2010  . Td 03/12/2012  . Zoster 06/07/2010    TDAP status: Up to date  Flu Vaccine status: Up to date  Pneumococcal vaccine status: Up to date  Covid-19 vaccine status: Completed vaccines, Advised patient to bring card to upcoming visit so dates can be documented in chart.  Qualifies for Shingles Vaccine? Yes   Zostavax completed Yes   Shingrix Completed?: No.    Education has been provided regarding the importance of this  vaccine. Patient has been advised to call insurance company to determine out of pocket expense if they have not yet received this vaccine. Advised may also receive vaccine at local pharmacy or Health Dept. Verbalized acceptance and understanding.  Screening Tests Health Maintenance  Topic Date Due  . COVID-19 Vaccine (1) Never done  . COLONOSCOPY (Pts 45-1yrs Insurance coverage will need to be confirmed)  05/03/2018  . INFLUENZA VACCINE  04/04/2021  . TETANUS/TDAP  03/12/2022  . PNA vac Low Risk Adult  Completed  . HPV VACCINES  Aged Out    Health Maintenance  Health Maintenance Due  Topic Date Due  . COVID-19 Vaccine (1) Never done  . COLONOSCOPY (Pts 45-53yrs Insurance coverage will need to be confirmed)  05/03/2018    Colorectal cancer screening: due, wants to discuss having this done with provider at upcoming visit.   Lung Cancer Screening: (Low Dose CT Chest recommended if Age 85-80 years, 30 pack-year currently smoking OR have quit w/in 15 years.) does not qualify.  Additional Screening:  Hepatitis C Screening: does not qualify; Completed N/A  Vision Screening: Recommended annual ophthalmology exams for early detection of glaucoma and other disorders of the eye. Is the patient up to date with their annual eye exam?  Yes  Who is the provider or what is the name of the office in which the patient attends annual eye exams? Dr. [place] Pink  If pt is not established with a provider, would they like to be referred to a provider to establish care? No .   Dental Screening: Recommended annual dental exams for proper oral hygiene  Community Resource Referral / Chronic Care Management: CRR required this visit?  No   CCM required this visit?  No      Plan:     I have personally reviewed and noted the following in the patient's chart:   . Medical and social history . Use of alcohol, tobacco or illicit drugs  . Current medications and supplements . Functional ability and  status . Nutritional status . Physical activity . Advanced directives . List of other physicians . Hospitalizations, surgeries, and ER visits in previous 12 months . Vitals . Screenings to include cognitive, depression, and falls . Referrals and appointments  In addition, I have reviewed and discussed with patient certain preventive protocols, quality metrics, and best practice recommendations. A written personalized care plan for preventive services as well as general preventive health recommendations were provided to patient.   Due to this being a telephonic visit, the after visit summary with patients personalized plan was offered to patient via office or my-chart. Patient preferred to pick up at office at next visit or via mychart.   Andrez Grime, LPN   [id number]

## 2020-12-21 NOTE — Patient Instructions (Signed)
[name] , Thank you for taking time to come for your Medicare Wellness Visit. I appreciate your ongoing commitment to your health goals. Please review the following plan we discussed and let me know if I can assist you in the future.   Screening recommendations/referrals: Colonoscopy: due, will discuss with provider at upcoming visit. Recommended yearly ophthalmology/optometry visit for glaucoma screening and checkup Recommended yearly dental visit for hygiene and checkup  Vaccinations: Influenza vaccine: Up to date, completed 06/01/2020, due 04/2021 Pneumococcal vaccine: Completed series Tdap vaccine: Up to date, completed 03/12/2012 due 03/2022 Shingles vaccine: due, check with your insurance regarding coverage if interested    Covid-19: Completed series, Please bring card so we can document dates in your chart   Advanced directives: Advance directive discussed with you today. Even though you declined this today please call our office should you change your mind and we can give you the proper paperwork for you to fill out.   Conditions/risks identified: hypertension, hyperlipidemia   Next appointment: Follow up in one year for your annual wellness visit.   Preventive Care 52 Years and Older, Male Preventive care refers to lifestyle choices and visits with your health care provider that can promote health and wellness. What does preventive care include?  A yearly physical exam. This is also called an annual well check.  Dental exams once or twice a year.  Routine eye exams. Ask your health care provider how often you should have your eyes checked.  Personal lifestyle choices, including:  Daily care of your teeth and gums.  Regular physical activity.  Eating a healthy diet.  Avoiding tobacco and drug use.  Limiting alcohol use.  Practicing safe sex.  Taking low doses of aspirin every day.  Taking vitamin and mineral supplements as recommended by your health care  provider. What happens during an annual well check? The services and screenings done by your health care provider during your annual well check will depend on your age, overall health, lifestyle risk factors, and family history of disease. Counseling  Your health care provider may ask you questions about your:  Alcohol use.  Tobacco use.  Drug use.  Emotional well-being.  Home and relationship well-being.  Sexual activity.  Eating habits.  History of falls.  Memory and ability to understand (cognition).  Work and work Statistician. Screening  You may have the following tests or measurements:  Height, weight, and BMI.  Blood pressure.  Lipid and cholesterol levels. These may be checked every 5 years, or more frequently if you are over 82 years old.  Skin check.  Lung cancer screening. You may have this screening every year starting at age 82 if you have a 30-pack-year history of smoking and currently smoke or have quit within the past 15 years.  Fecal occult blood test (FOBT) of the stool. You may have this test every year starting at age 82.  Flexible sigmoidoscopy or colonoscopy. You may have a sigmoidoscopy every 5 years or a colonoscopy every 10 years starting at age 82.  Prostate cancer screening. Recommendations will vary depending on your family history and other risks.  Hepatitis C blood test.  Hepatitis B blood test.  Sexually transmitted disease (STD) testing.  Diabetes screening. This is done by checking your blood sugar (glucose) after you have not eaten for a while (fasting). You may have this done every 1-3 years.  Abdominal aortic aneurysm (AAA) screening. You may need this if you are a current or former smoker.  Osteoporosis.  You may be screened starting at age 82 if you are at high risk. Talk with your health care provider about your test results, treatment options, and if necessary, the need for more tests. Vaccines  Your health care provider  may recommend certain vaccines, such as:  Influenza vaccine. This is recommended every year.  Tetanus, diphtheria, and acellular pertussis (Tdap, Td) vaccine. You may need a Td booster every 10 years.  Zoster vaccine. You may need this after age 82.  Pneumococcal 13-valent conjugate (PCV13) vaccine. One dose is recommended after age 82.  Pneumococcal polysaccharide (PPSV23) vaccine. One dose is recommended after age 82. Talk to your health care provider about which screenings and vaccines you need and how often you need them. This information is not intended to replace advice given to you by your health care provider. Make sure you discuss any questions you have with your health care provider. Document Released: 09/17/2015 Document Revised: 05/10/2016 Document Reviewed: 06/22/2015 Elsevier Interactive Patient Education  2017 [place] Prevention in the Home Falls can cause injuries. They can happen to people of all ages. There are many things you can do to make your home safe and to help prevent falls. What can I do on the outside of my home?  Regularly fix the edges of walkways and driveways and fix any cracks.  Remove anything that might make you trip as you walk through a door, such as a raised step or threshold.  Trim any bushes or trees on the path to your home.  Use bright outdoor lighting.  Clear any walking paths of anything that might make someone trip, such as rocks or tools.  Regularly check to see if handrails are loose or broken. Make sure that both sides of any steps have handrails.  Any raised decks and porches should have guardrails on the edges.  Have any leaves, snow, or ice cleared regularly.  Use sand or salt on walking paths during winter.  Clean up any spills in your garage right away. This includes oil or grease spills. What can I do in the bathroom?  Use night lights.  Install grab bars by the toilet and in the tub and shower. Do not use  towel bars as grab bars.  Use non-skid mats or decals in the tub or shower.  If you need to sit down in the shower, use a plastic, non-slip stool.  Keep the floor dry. Clean up any water that spills on the floor as soon as it happens.  Remove soap buildup in the tub or shower regularly.  Attach bath mats securely with double-sided non-slip rug tape.  Do not have throw rugs and other things on the floor that can make you trip. What can I do in the bedroom?  Use night lights.  Make sure that you have a light by your bed that is easy to reach.  Do not use any sheets or blankets that are too big for your bed. They should not hang down onto the floor.  Have a firm chair that has side arms. You can use this for support while you get dressed.  Do not have throw rugs and other things on the floor that can make you trip. What can I do in the kitchen?  Clean up any spills right away.  Avoid walking on wet floors.  Keep items that you use a lot in easy-to-reach places.  If you need to reach something above you, use a strong step stool  that has a grab bar.  Keep electrical cords out of the way.  Do not use floor polish or wax that makes floors slippery. If you must use wax, use non-skid floor wax.  Do not have throw rugs and other things on the floor that can make you trip. What can I do with my stairs?  Do not leave any items on the stairs.  Make sure that there are handrails on both sides of the stairs and use them. Fix handrails that are broken or loose. Make sure that handrails are as long as the stairways.  Check any carpeting to make sure that it is firmly attached to the stairs. Fix any carpet that is loose or worn.  Avoid having throw rugs at the top or bottom of the stairs. If you do have throw rugs, attach them to the floor with carpet tape.  Make sure that you have a light switch at the top of the stairs and the bottom of the stairs. If you do not have them, ask  someone to add them for you. What else can I do to help prevent falls?  Wear shoes that:  Do not have high heels.  Have rubber bottoms.  Are comfortable and fit you well.  Are closed at the toe. Do not wear sandals.  If you use a stepladder:  Make sure that it is fully opened. Do not climb a closed stepladder.  Make sure that both sides of the stepladder are locked into place.  Ask someone to hold it for you, if possible.  Clearly mark and make sure that you can see:  Any grab bars or handrails.  First and last steps.  Where the edge of each step is.  Use tools that help you move around (mobility aids) if they are needed. These include:  Canes.  Walkers.  Scooters.  Crutches.  Turn on the lights when you go into a dark area. Replace any light bulbs as soon as they burn out.  Set up your furniture so you have a clear path. Avoid moving your furniture around.  If any of your floors are uneven, fix them.  If there are any pets around you, be aware of where they are.  Review your medicines with your doctor. Some medicines can make you feel dizzy. This can increase your chance of falling. Ask your doctor what other things that you can do to help prevent falls. This information is not intended to replace advice given to you by your health care provider. Make sure you discuss any questions you have with your health care provider. Document Released: 06/17/2009 Document Revised: 01/27/2016 Document Reviewed: 09/25/2014 Elsevier Interactive Patient Education  2017 [employer].

## 2020-12-21 NOTE — Progress Notes (Signed)
PCP notes:  Health Maintenance: Colonoscopy- due, wants to have this done   Abnormal Screenings: none   Patient concerns: none   Nurse concerns: none   Next PCP appt.: 12/30/2020 @ 10:30 am

## 2020-12-30 ENCOUNTER — Other Ambulatory Visit: Payer: Self-pay

## 2020-12-30 ENCOUNTER — Ambulatory Visit (INDEPENDENT_AMBULATORY_CARE_PROVIDER_SITE_OTHER): Payer: Medicare Other | Admitting: Family Medicine

## 2020-12-30 ENCOUNTER — Encounter: Payer: Self-pay | Admitting: Family Medicine

## 2020-12-30 VITALS — BP 124/74 | HR 76 | Temp 97.7°F | Ht 67.0 in | Wt 161.0 lb

## 2020-12-30 DIAGNOSIS — E782 Mixed hyperlipidemia: Secondary | ICD-10-CM | POA: Diagnosis not present

## 2020-12-30 DIAGNOSIS — D472 Monoclonal gammopathy: Secondary | ICD-10-CM

## 2020-12-30 DIAGNOSIS — Z1211 Encounter for screening for malignant neoplasm of colon: Secondary | ICD-10-CM | POA: Diagnosis not present

## 2020-12-30 DIAGNOSIS — Z659 Problem related to unspecified psychosocial circumstances: Secondary | ICD-10-CM

## 2020-12-30 DIAGNOSIS — Z0000 Encounter for general adult medical examination without abnormal findings: Secondary | ICD-10-CM

## 2020-12-30 DIAGNOSIS — E039 Hypothyroidism, unspecified: Secondary | ICD-10-CM

## 2020-12-30 DIAGNOSIS — G2581 Restless legs syndrome: Secondary | ICD-10-CM

## 2020-12-30 DIAGNOSIS — M25579 Pain in unspecified ankle and joints of unspecified foot: Secondary | ICD-10-CM

## 2020-12-30 DIAGNOSIS — I10 Essential (primary) hypertension: Secondary | ICD-10-CM

## 2020-12-30 DIAGNOSIS — I701 Atherosclerosis of renal artery: Secondary | ICD-10-CM

## 2020-12-30 DIAGNOSIS — R011 Cardiac murmur, unspecified: Secondary | ICD-10-CM | POA: Diagnosis not present

## 2020-12-30 DIAGNOSIS — N1831 Chronic kidney disease, stage 3a: Secondary | ICD-10-CM

## 2020-12-30 LAB — CBC WITH DIFFERENTIAL/PLATELET
Basophils Absolute: 0.1 10*3/uL (ref 0.0–0.1)
Basophils Relative: 0.6 % (ref 0.0–3.0)
Eosinophils Absolute: 0.2 10*3/uL (ref 0.0–0.7)
Eosinophils Relative: 1.5 % (ref 0.0–5.0)
HCT: 39.6 % (ref 39.0–52.0)
Hemoglobin: 13.5 g/dL (ref 13.0–17.0)
Lymphocytes Relative: 17.2 % (ref 12.0–46.0)
Lymphs Abs: 2.3 10*3/uL (ref 0.7–4.0)
MCHC: 34 g/dL (ref 30.0–36.0)
MCV: 90.7 fl (ref 78.0–100.0)
Monocytes Absolute: 1 10*3/uL (ref 0.1–1.0)
Monocytes Relative: 7.6 % (ref 3.0–12.0)
Neutro Abs: 9.6 10*3/uL — ABNORMAL HIGH (ref 1.4–7.7)
Neutrophils Relative %: 73.1 % (ref 43.0–77.0)
Platelets: 244 10*3/uL (ref 150.0–400.0)
RBC: 4.37 Mil/uL (ref 4.22–5.81)
RDW: 16.8 % — ABNORMAL HIGH (ref 11.5–15.5)
WBC: 13.1 10*3/uL — ABNORMAL HIGH (ref 4.0–10.5)

## 2020-12-30 LAB — COMPREHENSIVE METABOLIC PANEL
ALT: 10 U/L (ref 0–53)
AST: 17 U/L (ref 0–37)
Albumin: 4.3 g/dL (ref 3.5–5.2)
Alkaline Phosphatase: 53 U/L (ref 39–117)
BUN: 18 mg/dL (ref 6–23)
CO2: 27 mEq/L (ref 19–32)
Calcium: 9.3 mg/dL (ref 8.4–10.5)
Chloride: 104 mEq/L (ref 96–112)
Creatinine, Ser: 1 mg/dL (ref 0.40–1.50)
GFR: 70.44 mL/min (ref >60.00)
Glucose, Bld: 102 mg/dL — ABNORMAL HIGH (ref 70–99)
Potassium: 4.8 mEq/L (ref 3.5–5.1)
Sodium: 138 mEq/L (ref 135–145)
Total Bilirubin: 0.8 mg/dL (ref 0.2–1.2)
Total Protein: 6.9 g/dL (ref 6.0–8.3)

## 2020-12-30 LAB — LIPID PANEL
Cholesterol: 143 mg/dL (ref 0–200)
HDL: 68.3 mg/dL (ref >39.00)
NonHDL: 74.85
Total CHOL/HDL Ratio: 2
Triglycerides: 250 mg/dL — ABNORMAL HIGH (ref 0.0–149.0)
VLDL: 50 mg/dL — ABNORMAL HIGH (ref 0.0–40.0)

## 2020-12-30 LAB — LDL CHOLESTEROL, DIRECT: Direct LDL: 41 mg/dL

## 2020-12-30 LAB — TSH: TSH: 1.42 u[IU]/mL (ref 0.35–4.50)

## 2020-12-30 MED ORDER — PRAVASTATIN SODIUM 40 MG PO TABS: ORAL_TABLET | ORAL | 0 refills | Status: DC

## 2020-12-30 MED ORDER — HYDROXYZINE HCL 10 MG PO TABS: 5.0000 mg | ORAL_TABLET | Freq: Three times a day (TID) | ORAL | 3 refills | Status: DC | PRN

## 2020-12-30 MED ORDER — PRAMIPEXOLE DIHYDROCHLORIDE 1 MG PO TABS: 1.0000 mg | ORAL_TABLET | Freq: Every day | ORAL | Status: DC

## 2020-12-30 NOTE — Progress Notes (Signed)
This visit occurred during the SARS-CoV-2 public health emergency.  Safety protocols were in place, including screening questions prior to the visit, additional usage of staff PPE, and extensive cleaning of exam room while observing appropriate contact time as indicated for disinfecting solutions.  Hypertension:    Using medication without problems or lightheadedness: yes Chest pain with exertion: no Edema:no Short of breath: His SOB is clearly better from prior, d/w pt.    Elevated Cholesterol: Using medications without problems: see below.   Muscle aches: see below.   Diet compliance: yes Exercise: yes He has been taking pravastatin intermittently in the meantime.  He thought he felt better on lower dose of pravastatin.    He had some fruit this AM but is fasting o/w.    History of CKD followed by renal clinic.  See notes on labs.  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  I will defer.  He agrees.  He has been taking mirapax once a day.  D/w pt about continuing for now.    Hypothyroidism. No neck mass, no dysphagia.  TSH pending.  Compliant with levothyroxine.    Flu 2021 Shingles 2013, d/w pt.  PNA up to date.   Tetanus 2013 covid vaccine 2021 Colon 2016 Prostate cancer screening deferred.   Advance directive encouraged.  Wife designated if patient were incapacitated We talked about colonoscopy.  He wanted to get this done.  D/w pt.  He feels well enough to consider the procedure.  No blood in stool.  No black stools.   Referral placed.  He has occ/rare insomnia.    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 Aortic valve regurgitation is not visualized. 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   We talked about anxiety.  He is more jittery when not at work.  He feels better at work.  We talked about prn trial of hydroxyzine.    Meds, vitals, and allergies reviewed.   PMH and SH reviewed  ROS: Per HPI unless  specifically indicated in ROS section   GEN: nad, alert and oriented HEENT: mucous membranes moist NECK: supple w/o LA CV: rrr.  Systolic ejection murmur noted PULM: ctab, no inc wob ABD: soft, +bs EXT: no edema SKIN: no acute rash

## 2020-12-30 NOTE — Patient Instructions (Addendum)
We'll call about seeing GI.   Take care.  Glad to see you.  Stop pravastatin and see if you feel better in the next 2 weeks.  Continue your injection every 2 weeks.   Go to the lab on the way out.   If you have mychart we'll likely use that to update you.    We'll call about getting the echo set up to check your heart murmur.

## 2021-01-02 DIAGNOSIS — R011 Cardiac murmur, unspecified: Secondary | ICD-10-CM | POA: Insufficient documentation

## 2021-01-02 DIAGNOSIS — Z659 Problem related to unspecified psychosocial circumstances: Secondary | ICD-10-CM | POA: Insufficient documentation

## 2021-01-02 NOTE — Assessment & Plan Note (Signed)
We talked about anxiety.  He is more jittery when not at work.  He feels better at work.  We talked about prn trial of hydroxyzine.   He will update me as needed.  Routine cautions given to patient.

## 2021-01-02 NOTE — Assessment & Plan Note (Signed)
Per outside clinic.  I will defer.  He agrees. 

## 2021-01-02 NOTE — Assessment & Plan Note (Signed)
I asked him to hold pravastatin for 2 weeks and see if he felt better in the meantime but to continue Repatha for now.  He agrees.  See notes on labs.

## 2021-01-02 NOTE — Assessment & Plan Note (Signed)
Would continue Mirapex for now.

## 2021-01-02 NOTE — Assessment & Plan Note (Signed)
Recheck echo, ordered.

## 2021-01-02 NOTE — Assessment & Plan Note (Signed)
Continue levothyroxine.  See notes on labs. 

## 2021-01-02 NOTE — Assessment & Plan Note (Signed)
Continue metoprolol losartan.  See notes on labs.

## 2021-01-02 NOTE — Assessment & Plan Note (Signed)
Wife designated if patient were incapacitated.  

## 2021-01-02 NOTE — Assessment & Plan Note (Signed)
Flu 2021 Shingles 2013, d/w pt.  PNA up to date.   Tetanus 2013 covid vaccine 2021 Colon 2016 Prostate cancer screening deferred.   Advance directive encouraged.  Wife designated if patient were incapacitated We talked about colonoscopy.  He wanted to get this done.  D/w pt.  He feels well enough to consider the procedure.  No blood in stool.  No black stools.   Referral placed.

## 2021-01-02 NOTE — Assessment & Plan Note (Signed)
See notes on follow-up labs.  Will forward labs to renal clinic.

## 2021-01-03 ENCOUNTER — Other Ambulatory Visit: Payer: Self-pay

## 2021-01-03 DIAGNOSIS — I739 Peripheral vascular disease, unspecified: Secondary | ICD-10-CM

## 2021-01-10 ENCOUNTER — Telehealth: Payer: Self-pay

## 2021-01-10 NOTE — Telephone Encounter (Signed)
Patients wife called in and stated that patient had an appointment with Dr. Damita Dunnings on 4/28 for a heart murmur. [name] stated that Dr. Damita Dunnings was going to get an appointment scheduled with patients cardiologist for an echocardiogram, but they have not heard anything in regards to this. Order placed for ECHO.

## 2021-01-11 DIAGNOSIS — N1831 Chronic kidney disease, stage 3a: Secondary | ICD-10-CM | POA: Diagnosis not present

## 2021-01-11 DIAGNOSIS — I10 Essential (primary) hypertension: Secondary | ICD-10-CM | POA: Diagnosis not present

## 2021-01-11 DIAGNOSIS — D631 Anemia in chronic kidney disease: Secondary | ICD-10-CM | POA: Diagnosis not present

## 2021-01-11 DIAGNOSIS — R809 Proteinuria, unspecified: Secondary | ICD-10-CM | POA: Diagnosis not present

## 2021-01-12 ENCOUNTER — Ambulatory Visit (INDEPENDENT_AMBULATORY_CARE_PROVIDER_SITE_OTHER)
Admission: RE | Admit: 2021-01-12 | Discharge: 2021-01-12 | Disposition: A | Discharge status: Home / Self Care | Payer: Medicare Other | Source: Ambulatory Visit | Attending: Vascular Surgery | Admitting: Vascular Surgery

## 2021-01-12 ENCOUNTER — Ambulatory Visit (HOSPITAL_COMMUNITY)
Admission: RE | Admit: 2021-01-12 | Discharge: 2021-01-12 | Disposition: A | Discharge status: Home / Self Care | Payer: Medicare Other | Source: Ambulatory Visit | Attending: Vascular Surgery | Admitting: Vascular Surgery

## 2021-01-12 ENCOUNTER — Ambulatory Visit (INDEPENDENT_AMBULATORY_CARE_PROVIDER_SITE_OTHER): Payer: Medicare Other | Admitting: Physician Assistant

## 2021-01-12 ENCOUNTER — Other Ambulatory Visit: Payer: Self-pay

## 2021-01-12 VITALS — BP 154/66 | HR 64 | Temp 98.0°F | Resp 20 | Ht 67.0 in | Wt 157.9 lb

## 2021-01-12 DIAGNOSIS — I739 Peripheral vascular disease, unspecified: Secondary | ICD-10-CM

## 2021-01-12 DIAGNOSIS — I701 Atherosclerosis of renal artery: Secondary | ICD-10-CM

## 2021-01-12 DIAGNOSIS — I70213 Atherosclerosis of native arteries of extremities with intermittent claudication, bilateral legs: Secondary | ICD-10-CM | POA: Diagnosis not present

## 2021-01-12 NOTE — Progress Notes (Signed)
Office Note     CC:  follow up Requesting Provider:  Tonia Ghent, MD  HPI: Ryan Pittman is a 80 y.o. (Jan 25, 1941) male who presents for routine surveillance of PAD.  Most recently he underwent angioplasty of the right external iliac artery and common femoral artery with atherectomy and drug-coated balloon angioplasty of right superficial femoral artery by Dr. Trula Slade on 06/08/2020.  Surgical history also significant for right iliofemoral endarterectomy with bovine patch angioplasty on 02/13/2020 as well as history of prior right SFA stenting.  He also has a remote history of left femoral to popliteal artery bypass graft with vein.  He still has mild claudication symptoms of right calf however he only experiences this after walking a long distance while golfing.  He denies any rest pain or tissue loss.  He is a former smoker.  He is on Plavix and statin daily.    Past Medical History:  Diagnosis Date  . Anemia    due to GIB, s/p transfusion  . Anginal pain ([place]) 07/10/2018  . Arthritis    back pain, much worse after consecutive golf rounds  . Cancer ([place])    thyroid  . Chronic kidney disease   . Colon polyps   . Coronary artery disease   . COVID-19   . Dyspnea    walking up a hill  . Dysrhythmia 2020  . GERD (gastroesophageal reflux disease)   . Hypercalcemia    h/o, resolved as of 2012, prev due to high amount of calcium intake  . Hyperlipidemia   . Hypertension   . Hypothyroidism   . IgG gammopathy    stable as of 2012 per [facility]  . Pneumonia    as a child  . PVD (peripheral vascular disease) ([place])    L leg bypass, R leg stented    Past Surgical History:  Procedure Laterality Date  .  dental implant left bottom-did have bleeding     . ABDOMINAL AORTOGRAM W/LOWER EXTREMITY Bilateral 06/26/2019   Procedure: ABDOMINAL AORTOGRAM W/LOWER EXTREMITY;  Surgeon: [name], MD;  Location: [place] [facility];  Service: Cardiovascular;  Laterality: Bilateral;  .  ABDOMINAL AORTOGRAM W/LOWER EXTREMITY N/A 06/08/2020   Procedure: ABDOMINAL AORTOGRAM W/LOWER EXTREMITY;  Surgeon: Serafina Mitchell, MD;  Location: [place] [facility];  Service: Cardiovascular;  Laterality: N/A;  . BYPASS GRAFT     L leg  . CARDIAC CATHETERIZATION  06/24/2018  . COLONOSCOPY WITH PROPOFOL N/A 05/04/2015   Procedure: COLONOSCOPY WITH PROPOFOL;  Surgeon: Lollie Sails, MD;  Location: [facility];  Service: Endoscopy;  Laterality: N/A;  . CORONARY ARTERY BYPASS GRAFT N/A 07/11/2018   Procedure: CORONARY ARTERY BYPASS GRAFTING (CABG) x three, using left internal mammary artery and right leg greater saphenous vein harvested endoscopically;  Surgeon: Gaye Pollack, MD;  Location: [place] [facility];  Service: Open Heart Surgery;  Laterality: N/A;  . ENDARTERECTOMY FEMORAL Right 02/13/2020   Procedure: RIGHT ENDARTERECTOMY FEMORAL WITH BOVINE PATCH;  Surgeon: Serafina Mitchell, MD;  Location: [place];  Service: Vascular;  Laterality: Right;  . LEFT HEART CATH AND CORONARY ANGIOGRAPHY N/A 06/24/2018   Procedure: LEFT HEART CATH AND CORONARY ANGIOGRAPHY;  Surgeon: [name], MD;  Location: [place] [facility];  Service: Cardiovascular;  Laterality: N/A;  . PERIPHERAL VASCULAR ATHERECTOMY Right 06/08/2020   Procedure: PERIPHERAL VASCULAR ATHERECTOMY;  Surgeon: Serafina Mitchell, MD;  Location: [place] [facility];  Service: Cardiovascular;  Laterality: Right;  superficial femoral  . PERIPHERAL  VASCULAR BALLOON ANGIOPLASTY Right 06/08/2020   Procedure: PERIPHERAL VASCULAR BALLOON ANGIOPLASTY;  Surgeon: Serafina Mitchell, MD;  Location: [place] [facility];  Service: Cardiovascular;  Laterality: Right;  External iliac  . TEE WITHOUT CARDIOVERSION N/A 07/11/2018   Procedure: TRANSESOPHAGEAL ECHOCARDIOGRAM (TEE);  Surgeon: Gaye Pollack, MD;  Location: [place];  Service: Open Heart Surgery;  Laterality: N/A;  . THYROIDECTOMY, PARTIAL  2016  . VASCULAR SURGERY      Social History   Socioeconomic  History  . Marital status: Married    Spouse name: Not on file  . Number of children: Not on file  . Years of education: Not on file  . Highest education level: Not on file  Occupational History  . Not on file  Tobacco Use  . Smoking status: Former Smoker    Types: Cigarettes    Quit date: 09/04/2008    Years since quitting: 12.3  . Smokeless tobacco: Former Systems developer    Types: Chew  . Tobacco comment: HAs quit tobacco products 2009  Vaping Use  . Vaping Use: Never used  Substance and Sexual Activity  . Alcohol use: Not Currently    Alcohol/week: 0.0 standard drinks  . Drug use: No  . Sexual activity: Yes  Other Topics Concern  . Not on file  Social History Narrative   Married 50+ years, 2 kids   [place] to play golf   Social Determinants of Health   Financial Resource Strain: Low Risk   . Difficulty of Paying Living Expenses: Not hard at all  Food Insecurity: No Food Insecurity  . Worried About Charity fundraiser in the Last Year: Never true  . Ran Out of Food in the Last Year: Never true  Transportation Needs: No Transportation Needs  . Lack of Transportation (Medical): No  . Lack of Transportation (Non-Medical): No  Physical Activity: Inactive  . Days of Exercise per Week: 0 days  . Minutes of Exercise per Session: 0 min  Stress: No Stress Concern Present  . Feeling of Stress : Not at all  Social Connections: Not on file  Intimate Partner Violence: Not At Risk  . Fear of Current or Ex-Partner: No  . Emotionally Abused: No  . Physically Abused: No  . Sexually Abused: No    Family History  Problem Relation Age of Onset  . Diabetes Mother   . Stroke Father   . Colon cancer Neg Hx   . Prostate cancer Neg Hx     Current Outpatient Medications  Medication Sig Dispense Refill  . amLODipine (NORVASC) 5 MG tablet Take 5 mg by mouth daily.     . clopidogrel (PLAVIX) 75 MG tablet TAKE 1 TABLET BY MOUTH DAILY 90 tablet 0  . hydrOXYzine  (ATARAX/VISTARIL) 10 MG tablet Take 0.5-1 tablets (5-10 mg total) by mouth 3 (three) times daily as needed for anxiety. 30 tablet 3  . levothyroxine (SYNTHROID) 75 MCG tablet TAKE 1 TABLET BY MOUTH DAILY EXCEPT ON THURSDAYS TAKE 1.5 TABLETS 100 tablet 3  . losartan (COZAAR) 100 MG tablet Take 100 mg by mouth daily.     . metoprolol tartrate (LOPRESSOR) 25 MG tablet TAKE 1/2 TABLET TWICE DAILY 30 tablet 9  . pramipexole (MIRAPEX) 1 MG tablet Take 1 tablet (1 mg total) by mouth daily.    [name] REPATHA SURECLICK [id number] MG/ML SOAJ INJECT 140MG  EVERY 14 DAYS 2 mL 11  . tamsulosin (FLOMAX) 0.4 MG CAPS capsule Take 1 capsule (0.4  mg total) by mouth daily. 90 capsule 3  . pravastatin (PRAVACHOL) 40 MG tablet Skip for 2 weeks starting 12/30/20 and update Damita Dunnings (Patient not taking: Reported on 01/12/2021) 90 tablet 0   No current facility-administered medications for this visit.    Allergies  Allergen Reactions  . Penicillins Itching and Other (See Comments)    PATIENT HAS HAD A PCN REACTION WITH IMMEDIATE RASH, FACIAL/TONGUE/THROAT SWELLING, SOB, OR LIGHTHEADEDNESS WITH HYPOTENSION:  #  #  YES  #  #  Has patient had a PCN reaction causing severe rash involving mucus membranes or skin necrosis: No Has patient had a PCN reaction that required hospitalization: No Has patient had a PCN reaction occurring within the last 10 years: No If all of the above answers are "NO", then may proceed with Cephalosporin use.   . Ace Inhibitors Cough  . Aspirin Other (See Comments)    H/o GI bleed  . Celebrex [Celecoxib] Other (See Comments)    GI bleed  . Lipitor [Atorvastatin] Other (See Comments)    myalgias     REVIEW OF SYSTEMS:   [X]  denotes positive finding, [ ]  denotes negative finding Cardiac  Comments:  Chest pain or chest pressure:    Shortness of breath upon exertion:    Short of breath when lying flat:    Irregular heart rhythm:        Vascular    Pain in calf, thigh, or hip brought on by  ambulation:    Pain in feet at night that wakes you up from your sleep:     Blood clot in your veins:    Leg swelling:         Pulmonary    Oxygen at home:    Productive cough:     Wheezing:         Neurologic    Sudden weakness in arms or legs:     Sudden numbness in arms or legs:     Sudden onset of difficulty speaking or slurred speech:    Temporary loss of vision in one eye:     Problems with dizziness:         Gastrointestinal    Blood in stool:     Vomited blood:         Genitourinary    Burning when urinating:     Blood in urine:        Psychiatric    Major depression:         Hematologic    Bleeding problems:    Problems with blood clotting too easily:        Skin    Rashes or ulcers:        Constitutional    Fever or chills:      PHYSICAL EXAMINATION:  Vitals:   01/12/21 1044  BP: (!) 154/66  Pulse: 64  Resp: 20  Temp: 98 F (36.7 C)  TempSrc: Temporal  SpO2: 98%  Weight: 157 lb 14.4 oz (71.6 kg)  Height: 5\' 7"  (1.702 m)    General:  WDWN in NAD; vital signs documented above Gait: Not observed HENT: WNL, normocephalic Pulmonary: normal non-labored breathing Cardiac: regular HR Abdomen: soft, NT, no masses Skin: without rashes Vascular Exam/Pulses:  Right Left  Radial 2+ (normal) 2+ (normal)  DP Doppler only Doppler only  PT Doppler only Doppler only   Extremities: without ischemic changes, without Gangrene , without cellulitis; without open wounds;  Musculoskeletal: no muscle wasting or atrophy  Neurologic: A&O  X 3;  No focal weakness or paresthesias are detected Psychiatric:  The pt has Normal affect.   Non-Invasive Vascular Imaging:   ABI/TBIToday's ABIToday's TBIPrevious ABIPrevious TBI  +-------+-----------+-----------+------------+------------+  Right [place]     0.5    1.01    0.38      +-------+-----------+-----------+------------+------------+  Left  [place]     0.63    [place]     0.36       +-------+-----------+-----------+------------+------------+  Only slightly elevated velocities of the right common femoral artery and profunda otherwise right lower extremity duplex widely patent  Left femoropopliteal bypass duplex widely patent   ASSESSMENT/PLAN:: 80 y.o. male here for routine surveillance of PAD of bilateral lower extremities   Bilateral lower extremities well-perfused based on arterial duplex.  Left lower extremity with widely patent femoral to popliteal bypass graft.  Also, right lower extremity with widely patent common femoral and SFA interventions  Patient continues to have mild claudication symptoms of right calf however this is tolerable currently; continue active lifestyle with plenty of walking during the day  Continue Plavix and statin daily  Recheck bilateral lower extremity arterial duplex and ABIs in 1 year; return sooner if symptoms worsen or if any new problems arise   Dagoberto Ligas, PA-C [facility] [phone]  Clinic MD:  Scot Dock

## 2021-01-13 ENCOUNTER — Other Ambulatory Visit: Payer: Self-pay

## 2021-01-13 DIAGNOSIS — I739 Peripheral vascular disease, unspecified: Secondary | ICD-10-CM

## 2021-01-14 NOTE — Telephone Encounter (Signed)
Ryan Pittman (DPR signed) left v/m requesting cb on status of cardiology referral. See Anisha's note. Carol request cb; thinks pt does need to see card.

## 2021-01-17 ENCOUNTER — Telehealth: Payer: Self-pay | Admitting: Family Medicine

## 2021-01-17 NOTE — Telephone Encounter (Signed)
Patient is scheduled for the ECHO on 02/10/21 at 11:00 at [facility] They are booked out at [facility]  Pt states that he is still having the tinges of 'pain' in his left chest; no radiation to arm or neck, denies SOB or chest tightness. Pt advised that if symptoms worsen or new symptoms appear to contact our office asap.   Will send to Dr Damita Dunnings to make aware of appt date/time.

## 2021-01-17 NOTE — Telephone Encounter (Signed)
Patient is scheduled for the ECHO on 02/10/21 at 11:00 at [facility] They are booked out at [facility]  Pt states that he is still having the tinges of 'pain' in his left chest; no radiation to arm or neck, denies SOB or chest tightness. Pt advised that if symptoms worsen or new symptoms appear to contact our office asap.   Will send to Dr Duncan to make aware of appt date/time.  

## 2021-01-18 NOTE — Telephone Encounter (Signed)
Please see if his symptoms are exertional.  If exertional, if persistent/recurrent, or if escalating, then needs OV.  O/w keep echo as scheduled.  Thanks.

## 2021-01-18 NOTE — Telephone Encounter (Signed)
Spoke with patient and advised on below. Patient agrees to make an OV if sx get worse or persistent/recurrent; ER precautions also given to patient.

## 2021-02-10 ENCOUNTER — Other Ambulatory Visit: Payer: Self-pay

## 2021-02-10 ENCOUNTER — Ambulatory Visit
Admission: RE | Admit: 2021-02-10 | Discharge: 2021-02-10 | Disposition: A | Discharge status: Home / Self Care | Payer: Medicare Other | Source: Ambulatory Visit | Attending: Family Medicine | Admitting: Family Medicine

## 2021-02-10 DIAGNOSIS — R011 Cardiac murmur, unspecified: Secondary | ICD-10-CM | POA: Diagnosis not present

## 2021-02-10 DIAGNOSIS — I10 Essential (primary) hypertension: Secondary | ICD-10-CM | POA: Insufficient documentation

## 2021-02-10 DIAGNOSIS — I071 Rheumatic tricuspid insufficiency: Secondary | ICD-10-CM | POA: Insufficient documentation

## 2021-02-10 DIAGNOSIS — I350 Nonrheumatic aortic (valve) stenosis: Secondary | ICD-10-CM | POA: Diagnosis not present

## 2021-02-10 LAB — ECHOCARDIOGRAM COMPLETE
AR max vel: 2.33 cm2
AV Area VTI: 3.05 cm2
AV Area mean vel: 2.56 cm2
AV Mean grad: 4 mmHg
AV Peak grad: 6.9 mmHg
Ao pk vel: 1.31 m/s
Area-P 1/2: 2.03 cm2
S' Lateral: 2.51 cm

## 2021-02-17 ENCOUNTER — Other Ambulatory Visit: Payer: Self-pay

## 2021-02-17 ENCOUNTER — Ambulatory Visit (INDEPENDENT_AMBULATORY_CARE_PROVIDER_SITE_OTHER): Payer: Medicare Other | Admitting: Physician Assistant

## 2021-02-17 ENCOUNTER — Ambulatory Visit (HOSPITAL_COMMUNITY)
Admission: RE | Admit: 2021-02-17 | Discharge: 2021-02-17 | Disposition: A | Discharge status: Home / Self Care | Payer: Medicare Other | Source: Ambulatory Visit | Attending: Vascular Surgery | Admitting: Vascular Surgery

## 2021-02-17 ENCOUNTER — Ambulatory Visit (INDEPENDENT_AMBULATORY_CARE_PROVIDER_SITE_OTHER)
Admission: RE | Admit: 2021-02-17 | Discharge: 2021-02-17 | Disposition: A | Discharge status: Home / Self Care | Payer: Medicare Other | Source: Ambulatory Visit | Attending: Vascular Surgery | Admitting: Vascular Surgery

## 2021-02-17 VITALS — BP 149/70 | HR 63 | Temp 97.7°F | Resp 14 | Ht 67.0 in | Wt 157.9 lb

## 2021-02-17 DIAGNOSIS — I739 Peripheral vascular disease, unspecified: Secondary | ICD-10-CM

## 2021-02-17 DIAGNOSIS — R0989 Other specified symptoms and signs involving the circulatory and respiratory systems: Secondary | ICD-10-CM

## 2021-02-17 NOTE — Progress Notes (Signed)
HISTORY AND PHYSICAL     CC:  follow up. Requesting Provider:  Tonia Ghent, MD  HPI: This is a 80 y.o. male who is here today for follow up for PAD.  He has hx of angioplasty of the right external iliac artery and common femoral artery with atherectomy and drug-coated balloon angioplasty of right superficial femoral artery by Dr. Trula Slade on 06/08/2020.  Surgical history also significant for right iliofemoral endarterectomy with bovine patch angioplasty on 02/13/2020 as well as history of prior right SFA stenting.  He also has a remote history of left femoral to popliteal artery bypass graft with vein.  Pt was last seen 01/12/2021 and at that time, he was still having some mild right calf claudication sx but only experienced this with walking a long distance while golfing.  He was not having rest pain or tissue loss.  He was on statin/Plavix daily.   The pt returns today for follow up and accompanied by  his wife.  Pt was supposed to return in one year, however, it was accidentally checked to return in one month and he is here for that visit.  Pt states he still has the right leg claudication walking a long distance.  He does get some cramping at night.  He does not have any rest pain or non healing wounds.    Pt works at [employer].  They tell me they have had a bear roaming around their neighborhood!  The pt is on a statin for cholesterol management.    The pt is not on an aspirin.    Other AC:  Plavix The pt is on CCB, ARB, BB for hypertension.  The pt is not have diabetes. Tobacco hx:  former  Pt does not have family hx of AAA.  Past Medical History:  Diagnosis Date   Anemia    due to GIB, s/p transfusion   Anginal pain ([place]) 07/10/2018   Arthritis    back pain, much worse after consecutive golf rounds   Cancer [facility])    thyroid   Chronic kidney disease    Colon polyps    Coronary artery disease    COVID-19    Dyspnea    walking up a hill   Dysrhythmia 2020   GERD  (gastroesophageal reflux disease)    Hypercalcemia    h/o, resolved as of 2012, prev due to high amount of calcium intake   Hyperlipidemia    Hypertension    Hypothyroidism    IgG gammopathy    stable as of 2012 per [facility]   Pneumonia    as a child   PVD (peripheral vascular disease) ([place])    L leg bypass, R leg stented    Past Surgical History:  Procedure Laterality Date    dental implant left bottom-did have bleeding      ABDOMINAL AORTOGRAM W/LOWER EXTREMITY Bilateral 06/26/2019   Procedure: ABDOMINAL AORTOGRAM W/LOWER EXTREMITY;  Surgeon: [name], MD;  Location: [place] [facility];  Service: Cardiovascular;  Laterality: Bilateral;   ABDOMINAL AORTOGRAM W/LOWER EXTREMITY N/A 06/08/2020   Procedure: ABDOMINAL AORTOGRAM W/LOWER EXTREMITY;  Surgeon: Serafina Mitchell, MD;  Location: [place] [facility];  Service: Cardiovascular;  Laterality: N/A;   BYPASS GRAFT     L leg   CARDIAC CATHETERIZATION  06/24/2018   COLONOSCOPY WITH PROPOFOL N/A 05/04/2015   Procedure: COLONOSCOPY WITH PROPOFOL;  Surgeon: Lollie Sails, MD;  Location: [facility] ENDOSCOPY;  Service: Endoscopy;  Laterality: N/A;  CORONARY ARTERY BYPASS GRAFT N/A 07/11/2018   Procedure: CORONARY ARTERY BYPASS GRAFTING (CABG) x three, using left internal mammary artery and right leg greater saphenous vein harvested endoscopically;  Surgeon: Gaye Pollack, MD;  Location: [place] OR;  Service: Open Heart Surgery;  Laterality: N/A;   ENDARTERECTOMY FEMORAL Right 02/13/2020   Procedure: RIGHT ENDARTERECTOMY FEMORAL WITH BOVINE PATCH;  Surgeon: Serafina Mitchell, MD;  Location: [place];  Service: Vascular;  Laterality: Right;   LEFT HEART CATH AND CORONARY ANGIOGRAPHY N/A 06/24/2018   Procedure: LEFT HEART CATH AND CORONARY ANGIOGRAPHY;  Surgeon: [name], MD;  Location: [place] [facility];  Service: Cardiovascular;  Laterality: N/A;   PERIPHERAL VASCULAR ATHERECTOMY Right 06/08/2020   Procedure: PERIPHERAL VASCULAR  ATHERECTOMY;  Surgeon: Serafina Mitchell, MD;  Location: [place] [facility];  Service: Cardiovascular;  Laterality: Right;  superficial femoral   PERIPHERAL VASCULAR BALLOON ANGIOPLASTY Right 06/08/2020   Procedure: PERIPHERAL VASCULAR BALLOON ANGIOPLASTY;  Surgeon: Serafina Mitchell, MD;  Location: [place] [facility];  Service: Cardiovascular;  Laterality: Right;  External iliac   TEE WITHOUT CARDIOVERSION N/A 07/11/2018   Procedure: TRANSESOPHAGEAL ECHOCARDIOGRAM (TEE);  Surgeon: Gaye Pollack, MD;  Location: [place];  Service: Open Heart Surgery;  Laterality: N/A;   THYROIDECTOMY, PARTIAL  2016   VASCULAR SURGERY      Allergies  Allergen Reactions   Penicillins Itching and Other (See Comments)    PATIENT HAS HAD A PCN REACTION WITH IMMEDIATE RASH, FACIAL/TONGUE/THROAT SWELLING, SOB, OR LIGHTHEADEDNESS WITH HYPOTENSION:  #  #  YES  #  #  Has patient had a PCN reaction causing severe rash involving mucus membranes or skin necrosis: No Has patient had a PCN reaction that required hospitalization: No Has patient had a PCN reaction occurring within the last 10 years: No If all of the above answers are "NO", then may proceed with Cephalosporin use.    Ace Inhibitors Cough   Aspirin Other (See Comments)    H/o GI bleed   Celebrex [Celecoxib] Other (See Comments)    GI bleed   Lipitor [Atorvastatin] Other (See Comments)    myalgias    Current Outpatient Medications  Medication Sig Dispense Refill   amLODipine (NORVASC) 5 MG tablet Take 5 mg by mouth daily.      clopidogrel (PLAVIX) 75 MG tablet TAKE 1 TABLET BY MOUTH DAILY 90 tablet 0   hydrOXYzine (ATARAX/VISTARIL) 10 MG tablet Take 0.5-1 tablets (5-10 mg total) by mouth 3 (three) times daily as needed for anxiety. 30 tablet 3   levothyroxine (SYNTHROID) 75 MCG tablet TAKE 1 TABLET BY MOUTH DAILY EXCEPT ON THURSDAYS TAKE 1.5 TABLETS 100 tablet 3   losartan (COZAAR) 100 MG tablet Take 100 mg by mouth daily.      metoprolol tartrate  (LOPRESSOR) 25 MG tablet TAKE 1/2 TABLET TWICE DAILY 30 tablet 9   pramipexole (MIRAPEX) 1 MG tablet Take 1 tablet (1 mg total) by mouth daily.     pravastatin (PRAVACHOL) 40 MG tablet Skip for 2 weeks starting 12/30/20 and update Damita Dunnings (Patient not taking: Reported on 01/12/2021) 90 tablet 0   REPATHA SURECLICK 149 MG/ML SOAJ INJECT 140MG  EVERY 14 DAYS 2 mL 11   tamsulosin (FLOMAX) 0.4 MG CAPS capsule Take 1 capsule (0.4 mg total) by mouth daily. 90 capsule 3   No current facility-administered medications for this visit.    Family History  Problem Relation Age of Onset   Diabetes Mother    Stroke Father  Colon cancer Neg Hx    Prostate cancer Neg Hx     Social History   Socioeconomic History   Marital status: Married    Spouse name: Not on file   Number of children: Not on file   Years of education: Not on file   Highest education level: Not on file  Occupational History   Not on file  Tobacco Use   Smoking status: Former    Pack years: 0.00    Types: Cigarettes    Quit date: 09/04/2008    Years since quitting: 12.4   Smokeless tobacco: Former    Types: Chew   Tobacco comments:    HAs quit tobacco products 2009  Vaping Use   Vaping Use: Never used  Substance and Sexual Activity   Alcohol use: Not Currently    Alcohol/week: 0.0 standard drinks   Drug use: No   Sexual activity: Yes  Other Topics Concern   Not on file  Social History Narrative   Married 50+ years, 2 kids   [place] to play golf   Social Determinants of Health   Financial Resource Strain: Low Risk    Difficulty of Paying Living Expenses: Not hard at all  Food Insecurity: No Food Insecurity   Worried About Charity fundraiser in the Last Year: Never true   Arboriculturist in the Last Year: Never true  Transportation Needs: No Transportation Needs   Lack of Transportation (Medical): No   Lack of Transportation (Non-Medical): No  Physical Activity: Inactive   Days of Exercise  per Week: 0 days   Minutes of Exercise per Session: 0 min  Stress: No Stress Concern Present   Feeling of Stress : Not at all  Social Connections: Not on file  Intimate Partner Violence: Not At Risk   Fear of Current or Ex-Partner: No   Emotionally Abused: No   Physically Abused: No   Sexually Abused: No   REVIEW OF SYSTEMS:   [X]  denotes positive finding, [ ]  denotes negative finding Cardiac  Comments:  Chest pain or chest pressure:    Shortness of breath upon exertion:    Short of breath when lying flat:    Irregular heart rhythm:        Vascular    Pain in calf, thigh, or hip brought on by ambulation: x See HPI  Pain in feet at night that wakes you up from your sleep:     Blood clot in your veins:    Leg swelling:         Pulmonary    Oxygen at home:    Productive cough:     Wheezing:         Neurologic    Sudden weakness in arms or legs:     Sudden numbness in arms or legs:     Sudden onset of difficulty speaking or slurred speech:    Temporary loss of vision in one eye:     Problems with dizziness:         Gastrointestinal    Blood in stool:     Vomited blood:         Genitourinary    Burning when urinating:     Blood in urine:        Psychiatric    Major depression:         Hematologic    Bleeding problems:    Problems with blood clotting too easily:  Skin    Rashes or ulcers:        Constitutional    Fever or chills:      PHYSICAL EXAMINATION:  Today's Vitals   02/17/21 1106 02/17/21 1108  BP: (!) 149/70   Pulse: 63   Resp: 14   Temp: 97.7 F (36.5 C)   TempSrc: Temporal   SpO2: 96%   Weight: 157 lb 14.4 oz (71.6 kg)   Height: 5\' 7"  (1.702 m)   PainSc: 0-No pain 0-No pain   Body mass index is 24.73 kg/m.   General:  WDWN in NAD; vital signs documented above Gait: Not observed HENT: WNL, normocephalic Pulmonary: normal non-labored breathing , without wheezing Cardiac: regular HR, without  Murmur; with carotid bruit  right Abdomen: soft, NT, no masses; aortic pulse is not palpable Skin: without rashes Vascular Exam/Pulses:  Right Left  Radial Unable to palpate 2+ (normal)  Ulnar 2+ (normal) 2+ (normal)  Femoral 2+ (normal) 2+ (normal)  DP Brisk doppler signal Brisk doppler signal  PT Brisk doppler signal Brisk doppler signal  Peroneal Brisk doppler signal Brisk doppler signal   Extremities: without ischemic changes, without Gangrene , without cellulitis; without open wounds;  Musculoskeletal: no muscle wasting or atrophy  Neurologic: A&O X 3;  No focal weakness or paresthesias are detected Psychiatric:  The pt has Normal affect.   Non-Invasive Vascular Imaging:   ABI's/TBI's on 02/17/2021: Right:  1.05/0.22 - Great toe pressure: 34 Left:  1.08/0.34 - Great toe pressure: 54  BLE Arterial duplex on 02/18/2021: +-----------+--------+-----+---------------+----------+--------+  RIGHT      PSV cm/sRatioStenosis       Waveform  Comments  +-----------+--------+-----+---------------+----------+--------+  CFA Distal 191          30-49% stenosismonophasic          +-----------+--------+-----+---------------+----------+--------+  DFA        202          50-74% stenosisbiphasic            +-----------+--------+-----+---------------+----------+--------+  SFA Prox   111                         monophasic          +-----------+--------+-----+---------------+----------+--------+  SFA Mid    177          30-49% stenosismonophasic          +-----------+--------+-----+---------------+----------+--------+  SFA Distal 66                          monophasic          +-----------+--------+-----+---------------+----------+--------+  POP Prox   224          50-74% stenosismonophasic          +-----------+--------+-----+---------------+----------+--------+  POP Distal 63                          monophasic           +-----------+--------+-----+---------------+----------+--------+  ATA Distal 48                          monophasic          +-----------+--------+-----+---------------+----------+--------+  PTA Distal 129                         monophasic          +-----------+--------+-----+---------------+----------+--------+  PERO Distal19                          monophasic          +-----------+--------+-----+---------------+----------+--------+      Left Graft #1: fem-BK popliteal  +--------------------+--------+---------------+----------+--------+                      PSV cm/sStenosis       Waveform  Comments  +--------------------+--------+---------------+----------+--------+  Inflow              235     50-74% stenosisbiphasic            +--------------------+--------+---------------+----------+--------+  Proximal Anastomosis83                     monophasic          +--------------------+--------+---------------+----------+--------+  Proximal Graft      63                     biphasic            +--------------------+--------+---------------+----------+--------+  Mid Graft           50                     biphasic            +--------------------+--------+---------------+----------+--------+  Distal Graft        36                     monophasic          +--------------------+--------+---------------+----------+--------+  Distal Anastomosis  60                     monophasic          +--------------------+--------+---------------+----------+--------+  Outflow             77                     monophasic          +--------------------+--------+---------------+----------+--------+     Summary:  Right: Stenosis in the CFA, origin of the PFA, mid SFA and popliteal artery. See table for stenosis range.   Left: Patent fem-BK popliteal bypass graft with 50-74% stenosis in the inflow artery.    Previous ABI's/TBI's on  01/12/2021: Right:  1.32 ([place])/0.50 - Great toe pressure: 79 Left:  1.62 ([place])/0.63 - Great toe pressure:  99  Previous arterial duplex on 01/12/2021: +-----------+--------+-----+---------------+----------+--------+  RIGHT PSV cm/sRatioStenosis   Waveform Comments  +-----------+--------+-----+---------------+----------+--------+  CFA Distal 244          50-74% stenosisbiphasic            +-----------+--------+-----+---------------+----------+--------+  DFA        240          50-74% stenosisbiphasic            +-----------+--------+-----+---------------+----------+--------+  SFA Prox   121                                  biphasic            +-----------+--------+-----+---------------+----------+--------+  SFA Mid    110  biphasic            +-----------+--------+-----+---------------+----------+--------+  SFA Distal 71                                    biphasic            +-----------+--------+-----+---------------+----------+--------+  POP Prox   98                                    biphasic            +-----------+--------+-----+---------------+----------+--------+  POP Distal 184          30-49% stenosisbiphasic            +-----------+--------+-----+---------------+----------+--------+  ATA Distal 42                                    biphasic            +-----------+--------+-----+---------------+----------+--------+  PTA Distal 71                                     biphasic            +-----------+--------+-----+---------------+----------+--------+  PERO Distal44                                 monophasic          +-----------+--------+-----+---------------+----------+--------+   Left Graft #1: Left Fem Pop bypass  +--------------------+--------+--------+--------+--------+                      PSV cm/sStenosisWaveformComments   +--------------------+--------+--------+--------+--------+  Inflow              40                biphasic          +--------------------+--------+--------+--------+--------+  Proximal Anastomosis47        biphasic          +--------------------+--------+--------+--------+--------+  Proximal Graft      70           biphasic          +--------------------+--------+--------+--------+--------+  Mid Graft           72              biphasic          +--------------------+--------+--------+--------+--------+  Distal Graft        49              biphasic          +--------------------+--------+--------+--------+--------+  Distal Anastomosis  66         biphasic          +--------------------+--------+--------+--------+--------+  Outflow             62               biphasic          +--------------------+--------+--------+--------+--------+   Summary:  Right: 50-74% stenosis noted in the common femoral artery. 50-74% stenosis noted in the deep femoral artery. 30-49% stenosis noted in the popliteal artery. SFA/ stent no significant stenosis noted.  Left: Patent Femoral popliteal bypass.   ASSESSMENT/PLAN:: 80 y.o. male here for follow up for PAD with hx of angioplasty of the right external iliac artery and common femoral artery with atherectomy and drug-coated balloon angioplasty of right superficial femoral artery by Dr. Trula Slade on 06/08/2020.  Surgical history also significant for right iliofemoral endarterectomy with bovine patch angioplasty on 02/13/2020 as well as history of prior right SFA stenting.  He also has a remote history of left femoral to popliteal artery bypass graft with vein.  PAD -pt doing well today.  He has brisk doppler signals bilateral DP/PT/peroneal.  He does not have rest pain or non healing wounds.  His duplex today reveals that there is a 50-74% stenosis of the inflow artery on the left, however, pt has bounding left femoral  artery pulse and good doppler signals left foot.  He also has a bounding right femoral artery pulse and brisk doppler signals right foot.  Given this, will bring the pt back in 6 months with repeat studies.  Discussed with him that if has any rest pain or non healing wounds before then, he should contact us sooner.   Right Carotid bruit -pt had carotid duplex in 2019 and has not had one since.  Right carotid bruit heard on exam today.  Will have him return in the next 2-4 weeks for duplex.  Discussed calling 911 should he develop any stroke sx.    Given today's visit was scheduled for one month instead of one year as noted in last provider note, there will be no charge for this visit.    Leontine Locket, [facility] [phone]  Clinic MD:   Oneida Alar

## 2021-02-18 ENCOUNTER — Other Ambulatory Visit: Payer: Self-pay

## 2021-02-18 DIAGNOSIS — I739 Peripheral vascular disease, unspecified: Secondary | ICD-10-CM

## 2021-02-18 DIAGNOSIS — R0989 Other specified symptoms and signs involving the circulatory and respiratory systems: Secondary | ICD-10-CM

## 2021-02-28 ENCOUNTER — Other Ambulatory Visit: Payer: Self-pay | Admitting: Family Medicine

## 2021-03-01 ENCOUNTER — Other Ambulatory Visit: Payer: Self-pay | Admitting: Family Medicine

## 2021-03-21 ENCOUNTER — Ambulatory Visit: Payer: Medicare Other

## 2021-03-21 ENCOUNTER — Encounter (HOSPITAL_COMMUNITY): Payer: Medicare Other

## 2021-03-28 ENCOUNTER — Ambulatory Visit (HOSPITAL_COMMUNITY)
Admission: RE | Admit: 2021-03-28 | Discharge: 2021-03-28 | Disposition: A | Discharge status: Home / Self Care | Payer: Medicare Other | Source: Ambulatory Visit | Attending: Surgery | Admitting: Surgery

## 2021-03-28 ENCOUNTER — Ambulatory Visit (INDEPENDENT_AMBULATORY_CARE_PROVIDER_SITE_OTHER): Payer: Medicare Other | Admitting: Physician Assistant

## 2021-03-28 ENCOUNTER — Other Ambulatory Visit: Payer: Self-pay

## 2021-03-28 VITALS — BP 144/77 | HR 72 | Temp 98.0°F | Resp 20 | Ht 67.0 in | Wt 156.8 lb

## 2021-03-28 DIAGNOSIS — R0989 Other specified symptoms and signs involving the circulatory and respiratory systems: Secondary | ICD-10-CM | POA: Diagnosis not present

## 2021-03-28 DIAGNOSIS — I701 Atherosclerosis of renal artery: Secondary | ICD-10-CM

## 2021-03-28 NOTE — Progress Notes (Signed)
Carotid Artery Follow-Up   VASCULAR SURGERY ASSESSMENT & PLAN:   Ryan Pittman is a 80 y.o. male with right carotid artery bruit noted on recent exam.  Bilateral carotid artery stenosis: The patient has no symptoms referable to carotid artery stenosis.  Duplex examination today is stable as compared to 2019.  We reviewed the signs and symptoms of stroke/TIA and advised the patient to call EMS should these occur.   Continue optimal medical management of diabetes, hypertension and follow-up with primary care physician. Encouraged continuation of complete smoking cessation. Continue the following medications: Plavix (Intolerance to aspirin), on Repatha Follow-up in 2 years with carotid duplex ultrasound.  SUBJECTIVE:   The patient denies monocular blindness, slurred speech, facial drooping, extremity weakness or numbness. He states he uses Repatha instead of statin now and would like advice in regards to statin therapy for PAD. Spoke with Dr. Trula Slade and assured patient that as long as he is satisfied with injections, no need to add on or substitute statin.  PHYSICAL EXAM:   Vitals:   03/28/21 1048 03/28/21 1050  BP: (!) 151/73 (!) 144/77  Pulse: 72   Resp: 20   Temp: 98 F (36.7 C)   SpO2: 98%      General appearance: Well-developed, well-nourished in no apparent distress Neurologic: Alert and oriented x4, face symmetric, speech fluent. No ataxia. Cardiovascular: Heart rate and rhythm are regular.   Respirations: Nonlabored  NON-INVASIVE VASCULAR STUDIES  03/28/2021 Right Carotid: Velocities in the right ICA are consistent with a 1-39%  stenosis.   Left Carotid: Velocities in the left ICA are consistent with a 1-39%  stenosis.   Vertebrals:  Bilateral vertebral arteries demonstrate antegrade flow.  Subclavians: Right subclavian artery flow was disturbed. Normal flow  hemodynamics were seen in the left subclavian artery.    PROBLEM LIST:    The patient's past medical  history, past surgical history, family history, social history, allergy list and medication list are reviewed.   CURRENT MEDS:    Current Outpatient Medications:    amLODipine (NORVASC) 5 MG tablet, Take 5 mg by mouth daily. , Disp: , Rfl:    clopidogrel (PLAVIX) 75 MG tablet, TAKE 1 TABLET BY MOUTH DAILY, Disp: 90 tablet, Rfl: 0   hydrOXYzine (ATARAX/VISTARIL) 10 MG tablet, Take 0.5-1 tablets (5-10 mg total) by mouth 3 (three) times daily as needed for anxiety., Disp: 30 tablet, Rfl: 3   levothyroxine (SYNTHROID) 75 MCG tablet, TAKE 1 TABLET BY MOUTH DAILY EXCEPT ON THURSDAYS TAKE 1.5 TABLETS, Disp: 100 tablet, Rfl: 3   losartan (COZAAR) 100 MG tablet, Take 100 mg by mouth daily. , Disp: , Rfl:    metoprolol tartrate (LOPRESSOR) 25 MG tablet, TAKE 1/2 TABLET TWICE DAILY, Disp: 30 tablet, Rfl: 9   pramipexole (MIRAPEX) 1 MG tablet, Take 1 tablet (1 mg total) by mouth daily., Disp: , Rfl:    pravastatin (PRAVACHOL) 40 MG tablet, Skip for 2 weeks starting 12/30/20 and update [place], Disp: 90 tablet, Rfl: 0   REPATHA SURECLICK [id number] MG/ML SOAJ, INJECT '140MG'$  EVERY 14 DAYS, Disp: 2 mL, Rfl: 11   tamsulosin (FLOMAX) 0.4 MG CAPS capsule, Take 1 capsule (0.4 mg total) by mouth daily., Disp: 90 capsule, Rfl: 3   REVIEW OF SYSTEMS:   '[X]'$  denotes positive finding, '[ ]'$  denotes negative finding Cardiac  Comments:  Chest pain or chest pressure:    Shortness of breath upon exertion:    Short of breath when lying flat:    Irregular heart rhythm:  Vascular    Pain in calf, thigh, or hip brought on by ambulation:    Pain in feet at night that wakes you up from your sleep:     Blood clot in your veins:    Leg swelling:         Pulmonary    Oxygen at home:    Productive cough:     Wheezing:         Neurologic    Sudden weakness in arms or legs:     Sudden numbness in arms or legs:     Sudden onset of difficulty speaking or slurred speech:    Temporary loss of vision in one eye:      Problems with dizziness:         Gastrointestinal    Blood in stool:     Vomited blood:         Genitourinary    Burning when urinating:     Blood in urine:        Psychiatric    Major depression:         Hematologic    Bleeding problems:    Problems with blood clotting too easily:        Skin    Rashes or ulcers:        Constitutional    Fever or chills:     Barbie Banner, PA-C  Office: [phone] 03/28/2021 Clinic MD: Trula Slade

## 2021-03-30 ENCOUNTER — Other Ambulatory Visit: Payer: Self-pay | Admitting: Cardiovascular Disease

## 2021-05-06 ENCOUNTER — Telehealth: Payer: Self-pay

## 2021-05-06 NOTE — Telephone Encounter (Signed)
I spoke with pts wife; pt started with symptoms on 05/03/21. Pt has dry cough, pt feels like he has chest congestion that feels like it is loosening up today. Pt has hx of pneumonia and last hospitalization  for pneumonia and covid in Dec. 2020. Now oxygen level 97%. Pts wife could not remember value of BP but last BP was his usual. No fever,H/A, CP or SOB or dizzy. No vomiting or diarrhea and no S/T or runny nose. Pts wife said she wants to know if pt should be seen or is there something Dr Damita Dunnings could recommend for pt to get OTC like coricidin. Mrs Bardin said they want to stay out of ED. [facility]. Mrs Jha said pt is not in distress with his breathing and does not feel like pt needs to go to [facility] or ED. Sending note to Dr Damita Dunnings and Janett Billow CMA. Mrs Bareford request cb after reviewed by Dr Damita Dunnings. Will send teams for Janett Billow also.

## 2021-05-06 NOTE — Telephone Encounter (Signed)
Please triage patient and see what extra details you can get.  Thanks.

## 2021-05-06 NOTE — Telephone Encounter (Signed)
Pt's wife called in to report that pt has had significant congestion over last 3 days. She states pt isn't really coughing but has to clear his throat. Pt has history of pneumonia and they are concerned and would like suggestion from Dr. Damita Dunnings in order to keep pt out of hospital. Please return call to pt at [phone].

## 2021-05-06 NOTE — Telephone Encounter (Signed)
Patient advised to try plain musinex and not musinex D; patient verbalized understanding and will try.

## 2021-05-06 NOTE — Telephone Encounter (Signed)
I would try plain mucinex (not mucinex D) with plenty of water and see if that helps.  It sounds like he should gradually improve in the next few days.  I appreciate him checking in.  Thanks.

## 2021-05-12 ENCOUNTER — Telehealth: Payer: Self-pay

## 2021-05-12 ENCOUNTER — Encounter: Payer: Self-pay | Admitting: Emergency Medicine

## 2021-05-12 ENCOUNTER — Ambulatory Visit: Payer: Medicare Other

## 2021-05-12 ENCOUNTER — Ambulatory Visit
Admission: RE | Admit: 2021-05-12 | Discharge: 2021-05-12 | Disposition: A | Discharge status: Home / Self Care | Payer: Medicare Other | Source: Ambulatory Visit | Attending: Emergency Medicine | Admitting: Emergency Medicine

## 2021-05-12 ENCOUNTER — Other Ambulatory Visit: Payer: Self-pay

## 2021-05-12 ENCOUNTER — Ambulatory Visit
Admission: EM | Admit: 2021-05-12 | Discharge: 2021-05-12 | Disposition: A | Discharge status: Home / Self Care | Payer: Medicare Other | Attending: Emergency Medicine | Admitting: Emergency Medicine

## 2021-05-12 DIAGNOSIS — R059 Cough, unspecified: Secondary | ICD-10-CM

## 2021-05-12 DIAGNOSIS — J984 Other disorders of lung: Secondary | ICD-10-CM | POA: Insufficient documentation

## 2021-05-12 MED ORDER — BENZONATATE 200 MG PO CAPS: 200.0000 mg | ORAL_CAPSULE | Freq: Three times a day (TID) | ORAL | 0 refills | Status: DC | PRN

## 2021-05-12 NOTE — Discharge Instructions (Addendum)
Go to the [facility] for a chest x-ray.  I will contact you only if it is abnormal and needs further intervention.  I suspect your cough is coming from your losartan.  If your chest x-ray is normal, call your primary care provider and talk to him about it.  You may need to be on a different medication.  In the meantime, you can try Tessalon for the cough.

## 2021-05-12 NOTE — ED Provider Notes (Signed)
HPI  SUBJECTIVE:  Ryan Pittman is a 80 y.o. male who presents with dry cough for 1 week.  He denies fevers, bodyaches, headaches, nasal congestion, rhinorrhea, sinus pain or pressure, postnasal drip, sore throat, loss of sense of smell or taste, wheezing, chest pain, shortness of breath, nausea, vomiting, diarrhea, abdominal pain.  No known COVID exposure.  He got the COVID-vaccine.  No GERD or allergy symptoms.  No antibiotics in the past 3 months.  No antipyretic in the past 6 hours.  He tried Coricidin without improvement in his symptoms.  Symptoms better with cough drops and with eating.  No aggravating factors.  He has a past medical history of COVID in September 2021, atrial fibrillation on Eliquis, hypertension on losartan, coronary artery disease, CABG, chronic kidney disease, GERD which he states is not bothering him.  He quit smoking 20 to 30 years ago.  No history of diabetes, pulmonary disease.  [id number], Elveria Rising, MD   Past Medical History:  Diagnosis Date   Anemia    due to GIB, s/p transfusion   Anginal pain ([place]) 07/10/2018   Arthritis    back pain, much worse after consecutive golf rounds   Cancer [facility])    thyroid   Chronic kidney disease    Colon polyps    Coronary artery disease    COVID-19    Dyspnea    walking up a hill   Dysrhythmia 2020   GERD (gastroesophageal reflux disease)    Hypercalcemia    h/o, resolved as of 2012, prev due to high amount of calcium intake   Hyperlipidemia    Hypertension    Hypothyroidism    IgG gammopathy    stable as of 2012 per [facility]   Pneumonia    as a child   PVD (peripheral vascular disease) ([place])    L leg bypass, R leg stented    Past Surgical History:  Procedure Laterality Date    dental implant left bottom-did have bleeding      ABDOMINAL AORTOGRAM W/LOWER EXTREMITY Bilateral 06/26/2019   Procedure: ABDOMINAL AORTOGRAM W/LOWER EXTREMITY;  Surgeon: [name], MD;  Location: [place] [facility];  Service:  Cardiovascular;  Laterality: Bilateral;   ABDOMINAL AORTOGRAM W/LOWER EXTREMITY N/A 06/08/2020   Procedure: ABDOMINAL AORTOGRAM W/LOWER EXTREMITY;  Surgeon: Serafina Mitchell, MD;  Location: [place] [facility];  Service: Cardiovascular;  Laterality: N/A;   BYPASS GRAFT     L leg   CARDIAC CATHETERIZATION  06/24/2018   COLONOSCOPY WITH PROPOFOL N/A 05/04/2015   Procedure: COLONOSCOPY WITH PROPOFOL;  Surgeon: Lollie Sails, MD;  Location: [facility];  Service: Endoscopy;  Laterality: N/A;   CORONARY ARTERY BYPASS GRAFT N/A 07/11/2018   Procedure: CORONARY ARTERY BYPASS GRAFTING (CABG) x three, using left internal mammary artery and right leg greater saphenous vein harvested endoscopically;  Surgeon: Gaye Pollack, MD;  Location: [place] [facility];  Service: Open Heart Surgery;  Laterality: N/A;   ENDARTERECTOMY FEMORAL Right 02/13/2020   Procedure: RIGHT ENDARTERECTOMY FEMORAL WITH BOVINE PATCH;  Surgeon: Serafina Mitchell, MD;  Location: [place];  Service: Vascular;  Laterality: Right;   LEFT HEART CATH AND CORONARY ANGIOGRAPHY N/A 06/24/2018   Procedure: LEFT HEART CATH AND CORONARY ANGIOGRAPHY;  Surgeon: [name], MD;  Location: [place] [facility];  Service: Cardiovascular;  Laterality: N/A;   PERIPHERAL VASCULAR ATHERECTOMY Right 06/08/2020   Procedure: PERIPHERAL VASCULAR ATHERECTOMY;  Surgeon: Serafina Mitchell, MD;  Location: [place] [facility];  Service:  Cardiovascular;  Laterality: Right;  superficial femoral   PERIPHERAL VASCULAR BALLOON ANGIOPLASTY Right 06/08/2020   Procedure: PERIPHERAL VASCULAR BALLOON ANGIOPLASTY;  Surgeon: Serafina Mitchell, MD;  Location: [place] [facility];  Service: Cardiovascular;  Laterality: Right;  External iliac   TEE WITHOUT CARDIOVERSION N/A 07/11/2018   Procedure: TRANSESOPHAGEAL ECHOCARDIOGRAM (TEE);  Surgeon: Gaye Pollack, MD;  Location: [place];  Service: Open Heart Surgery;  Laterality: N/A;   THYROIDECTOMY, PARTIAL  2016   VASCULAR SURGERY      Family  History  Problem Relation Age of Onset   Diabetes Mother    Stroke Father    Colon cancer Neg Hx    Prostate cancer Neg Hx     Social History   Tobacco Use   Smoking status: Former    Types: Cigarettes    Quit date: 09/04/2008    Years since quitting: 12.6   Smokeless tobacco: Former    Types: Chew   Tobacco comments:    HAs quit tobacco products 2009  Vaping Use   Vaping Use: Never used  Substance Use Topics   Alcohol use: Not Currently    Alcohol/week: 0.0 standard drinks   Drug use: No    No current facility-administered medications for this encounter.  Current Outpatient Medications:    benzonatate (TESSALON) 200 MG capsule, Take 1 capsule (200 mg total) by mouth 3 (three) times daily as needed for cough., Disp: 30 capsule, Rfl: 0   amLODipine (NORVASC) 5 MG tablet, Take 5 mg by mouth daily. , Disp: , Rfl:    clopidogrel (PLAVIX) 75 MG tablet, TAKE 1 TABLET BY MOUTH DAILY, Disp: 90 tablet, Rfl: 0   hydrOXYzine (ATARAX/VISTARIL) 10 MG tablet, Take 0.5-1 tablets (5-10 mg total) by mouth 3 (three) times daily as needed for anxiety., Disp: 30 tablet, Rfl: 3   levothyroxine (SYNTHROID) 75 MCG tablet, TAKE 1 TABLET BY MOUTH DAILY EXCEPT ON THURSDAYS TAKE 1.5 TABLETS, Disp: 100 tablet, Rfl: 3   losartan (COZAAR) 50 MG tablet, Take 50 mg by mouth daily., Disp: , Rfl:    metoprolol tartrate (LOPRESSOR) 25 MG tablet, TAKE 1/2 TABLET TWICE DAILY, Disp: 90 tablet, Rfl: 3   pramipexole (MIRAPEX) 1 MG tablet, Take 1 tablet (1 mg total) by mouth daily., Disp: , Rfl:    pravastatin (PRAVACHOL) 40 MG tablet, Skip for 2 weeks starting 12/30/20 and update Damita Dunnings (Patient not taking: Reported on 03/28/2021), Disp: 90 tablet, Rfl: 0   REPATHA SURECLICK [id number] MG/ML SOAJ, INJECT '140MG'$  EVERY 14 DAYS, Disp: 2 mL, Rfl: 11   tamsulosin (FLOMAX) 0.4 MG CAPS capsule, Take 1 capsule (0.4 mg total) by mouth daily., Disp: 90 capsule, Rfl: 3  Allergies  Allergen Reactions   Penicillins Itching and Other  (See Comments)    PATIENT HAS HAD A PCN REACTION WITH IMMEDIATE RASH, FACIAL/TONGUE/THROAT SWELLING, SOB, OR LIGHTHEADEDNESS WITH HYPOTENSION:  #  #  YES  #  #  Has patient had a PCN reaction causing severe rash involving mucus membranes or skin necrosis: No Has patient had a PCN reaction that required hospitalization: No Has patient had a PCN reaction occurring within the last 10 years: No If all of the above answers are "NO", then may proceed with Cephalosporin use.    Ace Inhibitors Cough   Aspirin Other (See Comments)    H/o GI bleed   Celebrex [Celecoxib] Other (See Comments)    GI bleed   Lipitor [Atorvastatin] Other (See Comments)    myalgias  ROS  As noted in HPI.   Physical Exam  BP (!) 158/67 (BP Location: Left Arm)   Pulse 87   Temp 99.2 F (37.3 C) (Oral)   Resp 20   SpO2 95%   Constitutional: Well developed, well nourished, no acute distress Eyes:  EOMI, conjunctiva normal bilaterally HENT: Normocephalic, atraumatic,mucus membranes moist.  No nasal congestion.  Normal turbinates.  No maxillary, frontal sinus tenderness.  No postnasal drip. Respiratory: Normal inspiratory effort.  Questionable faint rales right lower lobe.  Good air movement. Cardiovascular: Normal rate, regular rhythm, no murmurs rubs or gallops GI: nondistended skin: No rash, skin intact Musculoskeletal: no deformities Neurologic: Alert & oriented x 3, no focal neuro deficits Psychiatric: Speech and behavior appropriate   ED Course   Medications - No data to display  Orders Placed This Encounter  Procedures   DG Chest 2 View    Standing Status:   Future    Number of Occurrences:   1    Standing Expiration Date:   05/13/2021    Order Specific Question:   Reason for Exam (SYMPTOM  OR DIAGNOSIS REQUIRED)    Answer:   ? rales RLL, cough x 1 week r/o PNA bronchitis, pleural effusion    Order Specific Question:   Preferred imaging location?    Answer:   [facility]    Order  Specific Question:   Call Results- Best Contact Number?    Answer:   [phone]    No results found for this or any previous visit (from the past 24 hour(s)). DG Chest 2 View  Result Date: 05/12/2021 CLINICAL DATA:  Cough for 1 week. EXAM: CHEST - 2 VIEW COMPARISON:  08/15/2019 FINDINGS: The lungs are clear without focal pneumonia, edema, pneumothorax or pleural effusion. Interstitial markings are diffusely coarsened with chronic features. Irregular nodular density is seen in the left mid lung superimposed on the posterior left sixth rib. The cardiopericardial silhouette is within normal limits for size. Status post CABG. The visualized bony structures of the thorax show no acute abnormality. IMPRESSION: 1. Irregular nodular density left mid lung. CT chest without contrast recommended to further evaluate. 2. Chronic interstitial changes. Electronically Signed   By: Misty Stanley M.D.   On: 05/12/2021 14:10    ED Clinical Impression  1. Cough      ED Assessment/Plan  Patient with some questionable rales at the base of the right lung.  Will check chest x-ray to rule out pneumonia.  He has no evidence of sinusitis, allergies, states his GERD is not bothering him.  He has no history of pulmonary disease, congestive heart failure.  He has no other signs and symptoms other than a cough.  His cough could be coming from the losartan.  Discussed this with patient.  If chest x-ray is negative, he needs to call his doctor for next steps regarding the losartan.  We will send home with Tessalon in the meantime.  COVID testing not done as he is out of the window for antiviral treatment.  [phone]  Reviewed imaging independently.  Irregular nodular density in the left lung.  CT chest recommended.  See radiology report for full details.  Called patient, discussed radiology findings, and discussed with him that this needs to be followed up with his primary care provider.  I also have sent a note to his PMD  requesting close follow-up.  Discussed imaging, patientmaging, MDM, treatment plan, and plan for follow-up with . Discussed sn/sx that should prompt return to  the ED. patient agrees with plan.   Meds ordered this encounter  Medications   benzonatate (TESSALON) 200 MG capsule    Sig: Take 1 capsule (200 mg total) by mouth 3 (three) times daily as needed for cough.    Dispense:  30 capsule    Refill:  0      *This clinic note was created using [employer]. Therefore, there may be occasional mistakes despite careful proofreading.  ?    Melynda Ripple, MD 05/12/21 [phone]

## 2021-05-12 NOTE — ED Triage Notes (Signed)
Cough started one week ago.  Having coughing episodes

## 2021-05-12 NOTE — Telephone Encounter (Signed)
Will await UC note. Thanks.

## 2021-05-12 NOTE — Telephone Encounter (Signed)
Per appt notes pt already has appt at [facility] in [place] on 05/12/21 at 11:15 AM. Sending note to Dr Damita Dunnings and Janett Billow CMA.

## 2021-05-12 NOTE — Telephone Encounter (Signed)
[place] [facility] - Client TELEPHONE ADVICE RECORD [facility] Patient Name: Ryan Pittman Gender: Male DOB: 1941-07-08 Age: 80 Y 1 M 27 D Return Phone Number: [phone] (Primary), [phone] (Secondary) Address: City/ State/ Zip: [place] [address] Client [place] [facility] - Client Client Site [place] Physician Renford Dills - MD Contact Type Call Who Is Calling Patient / Member / Family / Caregiver Call Type Triage / Clinical Caller Name [place] Relationship To Patient Other Return Phone Number [phone] (Primary) Chief Complaint WHEEZING Reason for Call Symptomatic / Request for [place] states, husband has a bad cough, now feels like he's wheezing. Office has no appt. for today and will be closed tomorrow. [place] Not Listed [facility] Translation No Nurse Assessment Nurse: Jimmye Norman, RN, [place] Date/Time (Eastern Time): 05/12/2021 8:29:44 AM Confirm and document reason for call. If symptomatic, describe symptoms. ---Caller states, husband has a bad cough, does not feel he is wheezing. Office has no appointments for today and will be closed tomorrow. Cough started last Wednesday, denies fever. Oxygen saturation is 97-98. Does the patient have any new or worsening symptoms? ---Yes Will a triage be completed? ---Yes Related visit to physician within the last 2 weeks? ---No Does the PT have any chronic conditions? (i.e. diabetes, asthma, this includes High risk factors for pregnancy, etc.) ---Yes List chronic conditions. ---hypertension Is this a behavioral health or substance abuse call? ---No Guidelines Guideline Title Affirmed Question Affirmed Notes Nurse Date/Time (Eastern Time) COVID-19 - Diagnosed or Suspected [1] HIGH RISK for severe COVID complications (e.g., weak immune system, age > 42 years, obesity  with BMI 30 or higher, Williams, RN, Whitney 05/12/2021 8:31:59 AM PLEASE NOTE: All timestamps contained within this report are represented as [place] Standard Time. CONFIDENTIALTY NOTICE: This fax transmission is intended only for the addressee. It contains information that is legally privileged, confidential or otherwise protected from use or disclosure. If you are not the intended recipient, you are strictly prohibited from reviewing, disclosing, copying using or disseminating any of this information or taking any action in reliance on or regarding this information. If you have received this fax in error, please notify us immediately by telephone so that we can arrange for its return to [place]. Phone: [phone], Toll-Free: [phone], Fax: [phone] Page: 2 of 2 Call Id: [id number] Guidelines Guideline Title Affirmed Question Affirmed Notes Nurse Date/Time Eilene Ghazi Time) pregnant, chronic lung disease or other chronic medical condition) AND [2] COVID symptoms (e.g., cough, fever) (Exceptions: Already seen by PCP and no new or worsening symptoms.) Disp. Time Eilene Ghazi Time) Disposition Final User 05/12/2021 8:27:59 AM Send to Urgent Laneta Simmers 05/12/2021 8:34:35 AM See HCP within 4 Hours (or PCP triage) Yes Jimmye Norman, RN, Whitney Disposition Overriden: Call PCP within 24 Hours Override Reason: Patient's symptoms need a higher level of care Caller Disagree/Comply Comply Caller Understands Yes PreDisposition InappropriateToAsk Care Advice Given Per Guideline SEE HCP (OR PCP TRIAGE) WITHIN 4 HOURS: * IF OFFICE WILL BE OPEN: You need to be seen within the next 3 or 4 hours. Call your doctor (or NP/PA) now or as soon as the office opens. Comments User: Myriam Forehand, RN Date/Time Eilene Ghazi Time): 05/12/2021 8:35:55 AM RN upgraded triage outcome as caller has not had a covid test and would be considered high risk, and office has no appointments. Referrals GO TO FACILITY  OTHER - SPECIFY

## 2021-05-13 ENCOUNTER — Other Ambulatory Visit: Payer: Self-pay | Admitting: Family Medicine

## 2021-05-13 DIAGNOSIS — R9389 Abnormal findings on diagnostic imaging of other specified body structures: Secondary | ICD-10-CM

## 2021-05-13 NOTE — Progress Notes (Signed)
Called patient.  Discussed chest x-ray and need for follow-up CT.  Patient is aware that I put in the order for the follow-up CT.  Please see when this can be scheduled and let me know.  He is using over-the-counter cough medicine in the meantime and that seems to be helping his cough.  He can update me as needed.  We will likely need to see him for follow-up of the cough so he will need to be scheduled but that likely depends on when his CT is scheduled.  Please talk to me about this on Monday.  Thanks.  Elsie Stain

## 2021-05-16 ENCOUNTER — Telehealth: Payer: Self-pay | Admitting: Family Medicine

## 2021-05-16 DIAGNOSIS — R809 Proteinuria, unspecified: Secondary | ICD-10-CM | POA: Diagnosis not present

## 2021-05-16 DIAGNOSIS — D631 Anemia in chronic kidney disease: Secondary | ICD-10-CM | POA: Diagnosis not present

## 2021-05-16 DIAGNOSIS — I10 Essential (primary) hypertension: Secondary | ICD-10-CM | POA: Diagnosis not present

## 2021-05-16 DIAGNOSIS — N1831 Chronic kidney disease, stage 3a: Secondary | ICD-10-CM | POA: Diagnosis not present

## 2021-05-16 MED ORDER — HYDROCODONE BIT-HOMATROP MBR 5-1.5 MG/5ML PO SOLN
5.0000 mL | Freq: Three times a day (TID) | ORAL | 0 refills | Status: DC | PRN
Start: 2021-05-16 — End: 2021-08-04

## 2021-05-16 NOTE — Telephone Encounter (Signed)
Called spoke to patient will get meds from pharmacy. Will get seen in no change. And let our office know when CT has been made.

## 2021-05-16 NOTE — Progress Notes (Signed)
Called patient has not received CT appointment yet.

## 2021-05-16 NOTE — Telephone Encounter (Signed)
I spoke with pt; pt said he coughs all the time and the cough med is not helping at all. Pt said the Delsym taking as prescribed has not helped at all and the benzonatate taking tid has not helped at all. Now if pt continues coughing a small amount of yellow phlegm comes up. Pt said he is not getting up a lot of phlegm. No CP but if has long coughing episode does have SOB. Pt said oxygen level is around 97%  on room air. Pt said he does not feel any improvement since last time talked with [facility]; but pt does not feel worse and is about the same. Pt said the worst thing is the coughing that will not stop. [facility]. Pt said he has not heard back yet about FU on lungs and some type lung test that Dr Damita Dunnings was gong to ck on. Pt request status of that lung test. Pt request cb after reviewed by Dr Damita Dunnings on 05/17/21.sending note to Dr Damita Dunnings and Janett Billow CMA.

## 2021-05-16 NOTE — Telephone Encounter (Signed)
I would try hycodan for the cough with sedation caution.  We are trying to get his CT set up and we'll update me as soon as that can be scheduled.  In the meantime, if progressive discolored sputum or SOB then he needs to get rechecked.  Thanks.

## 2021-05-16 NOTE — Telephone Encounter (Signed)
Please call pt and check with him about his cough and cough medicine effect.  Let me know how he is feeling.

## 2021-05-16 NOTE — Progress Notes (Signed)
See following phone note. 

## 2021-05-18 NOTE — Telephone Encounter (Signed)
Noted. Thanks.  I am checking on CT scheduling with referral staff.

## 2021-05-19 ENCOUNTER — Ambulatory Visit
Admission: RE | Admit: 2021-05-19 | Discharge: 2021-05-19 | Disposition: A | Discharge status: Home / Self Care | Payer: Medicare Other | Source: Ambulatory Visit | Attending: Family Medicine | Admitting: Family Medicine

## 2021-05-19 ENCOUNTER — Other Ambulatory Visit: Payer: Self-pay

## 2021-05-19 DIAGNOSIS — R911 Solitary pulmonary nodule: Secondary | ICD-10-CM | POA: Diagnosis not present

## 2021-05-19 DIAGNOSIS — I700 Atherosclerosis of aorta: Secondary | ICD-10-CM | POA: Diagnosis not present

## 2021-05-19 DIAGNOSIS — J439 Emphysema, unspecified: Secondary | ICD-10-CM | POA: Diagnosis not present

## 2021-05-19 DIAGNOSIS — R9389 Abnormal findings on diagnostic imaging of other specified body structures: Secondary | ICD-10-CM | POA: Insufficient documentation

## 2021-05-19 DIAGNOSIS — J479 Bronchiectasis, uncomplicated: Secondary | ICD-10-CM | POA: Diagnosis not present

## 2021-05-24 DIAGNOSIS — J479 Bronchiectasis, uncomplicated: Secondary | ICD-10-CM | POA: Diagnosis not present

## 2021-06-08 ENCOUNTER — Other Ambulatory Visit: Payer: Self-pay | Admitting: Family Medicine

## 2021-06-16 DIAGNOSIS — J449 Chronic obstructive pulmonary disease, unspecified: Secondary | ICD-10-CM | POA: Diagnosis not present

## 2021-06-21 DIAGNOSIS — D2261 Melanocytic nevi of right upper limb, including shoulder: Secondary | ICD-10-CM | POA: Diagnosis not present

## 2021-06-21 DIAGNOSIS — L570 Actinic keratosis: Secondary | ICD-10-CM | POA: Diagnosis not present

## 2021-06-21 DIAGNOSIS — D485 Neoplasm of uncertain behavior of skin: Secondary | ICD-10-CM | POA: Diagnosis not present

## 2021-06-21 DIAGNOSIS — C44319 Basal cell carcinoma of skin of other parts of face: Secondary | ICD-10-CM | POA: Diagnosis not present

## 2021-06-21 DIAGNOSIS — D2262 Melanocytic nevi of left upper limb, including shoulder: Secondary | ICD-10-CM | POA: Diagnosis not present

## 2021-06-21 DIAGNOSIS — Z85828 Personal history of other malignant neoplasm of skin: Secondary | ICD-10-CM | POA: Diagnosis not present

## 2021-06-21 DIAGNOSIS — D225 Melanocytic nevi of trunk: Secondary | ICD-10-CM | POA: Diagnosis not present

## 2021-06-21 DIAGNOSIS — D2272 Melanocytic nevi of left lower limb, including hip: Secondary | ICD-10-CM | POA: Diagnosis not present

## 2021-06-22 DIAGNOSIS — Z23 Encounter for immunization: Secondary | ICD-10-CM | POA: Diagnosis not present

## 2021-07-15 ENCOUNTER — Ambulatory Visit: Payer: Medicare Other | Admitting: Cardiovascular Disease

## 2021-07-21 ENCOUNTER — Other Ambulatory Visit: Payer: Self-pay | Admitting: Cardiovascular Disease

## 2021-08-01 NOTE — Progress Notes (Signed)
Cardiology Clinic Note   Patient Name: Ryan Pittman Date of Encounter: 08/04/2021  Primary Care Provider:  Tonia Ghent, MD Primary Cardiologist:  [name], MD  Patient Profile    Ryan Pittman 80 year old male presents the clinic today for evaluation of his chest pain.  Past Medical History    Past Medical History:  Diagnosis Date   Anemia    due to GIB, s/p transfusion   Anginal pain ([place]) 07/10/2018   Arthritis    back pain, much worse after consecutive golf rounds   Cancer [facility])    thyroid   Chronic kidney disease    Colon polyps    Coronary artery disease    COVID-19    Dyspnea    walking up a hill   Dysrhythmia 2020   GERD (gastroesophageal reflux disease)    Hypercalcemia    h/o, resolved as of 2012, prev due to high amount of calcium intake   Hyperlipidemia    Hypertension    Hypothyroidism    IgG gammopathy    stable as of 2012 per [facility]   Pneumonia    as a child   PVD (peripheral vascular disease) ([place])    L leg bypass, R leg stented   Past Surgical History:  Procedure Laterality Date    dental implant left bottom-did have bleeding      ABDOMINAL AORTOGRAM W/LOWER EXTREMITY Bilateral 06/26/2019   Procedure: ABDOMINAL AORTOGRAM W/LOWER EXTREMITY;  Surgeon: [name], MD;  Location: [place] [facility];  Service: Cardiovascular;  Laterality: Bilateral;   ABDOMINAL AORTOGRAM W/LOWER EXTREMITY N/A 06/08/2020   Procedure: ABDOMINAL AORTOGRAM W/LOWER EXTREMITY;  Surgeon: Serafina Mitchell, MD;  Location: [place] [facility];  Service: Cardiovascular;  Laterality: N/A;   BYPASS GRAFT     L leg   CARDIAC CATHETERIZATION  06/24/2018   COLONOSCOPY WITH PROPOFOL N/A 05/04/2015   Procedure: COLONOSCOPY WITH PROPOFOL;  Surgeon: Lollie Sails, MD;  Location: [facility];  Service: Endoscopy;  Laterality: N/A;   CORONARY ARTERY BYPASS GRAFT N/A 07/11/2018   Procedure: CORONARY ARTERY BYPASS GRAFTING (CABG) x three, using left internal  mammary artery and right leg greater saphenous vein harvested endoscopically;  Surgeon: Gaye Pollack, MD;  Location: [place] [facility];  Service: Open Heart Surgery;  Laterality: N/A;   ENDARTERECTOMY FEMORAL Right 02/13/2020   Procedure: RIGHT ENDARTERECTOMY FEMORAL WITH BOVINE PATCH;  Surgeon: Serafina Mitchell, MD;  Location: [place];  Service: Vascular;  Laterality: Right;   LEFT HEART CATH AND CORONARY ANGIOGRAPHY N/A 06/24/2018   Procedure: LEFT HEART CATH AND CORONARY ANGIOGRAPHY;  Surgeon: [name], MD;  Location: [place] [facility];  Service: Cardiovascular;  Laterality: N/A;   PERIPHERAL VASCULAR ATHERECTOMY Right 06/08/2020   Procedure: PERIPHERAL VASCULAR ATHERECTOMY;  Surgeon: Serafina Mitchell, MD;  Location: [place] [facility];  Service: Cardiovascular;  Laterality: Right;  superficial femoral   PERIPHERAL VASCULAR BALLOON ANGIOPLASTY Right 06/08/2020   Procedure: PERIPHERAL VASCULAR BALLOON ANGIOPLASTY;  Surgeon: Serafina Mitchell, MD;  Location: [place] [facility];  Service: Cardiovascular;  Laterality: Right;  External iliac   TEE WITHOUT CARDIOVERSION N/A 07/11/2018   Procedure: TRANSESOPHAGEAL ECHOCARDIOGRAM (TEE);  Surgeon: Gaye Pollack, MD;  Location: [place];  Service: Open Heart Surgery;  Laterality: N/A;   THYROIDECTOMY, PARTIAL  2016   VASCULAR SURGERY      Allergies  Allergies  Allergen Reactions   Penicillins Itching and Other (See Comments)    PATIENT HAS HAD  A PCN REACTION WITH IMMEDIATE RASH, FACIAL/TONGUE/THROAT SWELLING, SOB, OR LIGHTHEADEDNESS WITH HYPOTENSION:  #  #  YES  #  #  Has patient had a PCN reaction causing severe rash involving mucus membranes or skin necrosis: No Has patient had a PCN reaction that required hospitalization: No Has patient had a PCN reaction occurring within the last 10 years: No If all of the above answers are "NO", then may proceed with Cephalosporin use.    Ace Inhibitors Cough   Aspirin Other (See Comments)    H/o GI bleed    Celebrex [Celecoxib] Other (See Comments)    GI bleed   Lipitor [Atorvastatin] Other (See Comments)    myalgias    History of Present Illness    [name] has a PMH of PVD, carotid stenosis, hypertension, renal artery stenosis, paroxysmal atrial fibrillation, coronary artery disease, CKD stage III, and peripheral arterial disease.  He underwent coronary CTA 05/24/2018 which showed a calcium score of 2858 with three-vessel calcification.  He underwent cardiac catheterization by Dr. Alvester Chou 2019 which showed three-vessel disease and preserved LVEF.  He had CABG x5 by Dr. Cyndia Bent 07/11/2018.  He underwent peripheral angiography 06/26/2019 which showed 90% left renal artery stenosis, patent left femoral popliteal bypass graft patent right common iliac artery stent, 90% calcified right common femoral artery stenosis, 80% in-stent restenosis within the previously placed right SFA stent and 90% calcified right popliteal stenosis.  He underwent right common femoral endarterectomy and patch angioplasty by Dr. Trula Slade.  He underwent several subsequent procedures including orbital arthrectomy.  He was last seen by Dr. Gwenlyn Found on 07/27/2020.  During that time he denied chest pain shortness of breath and claudication.  He contacted nurse triage line on 09/28/2020.  During that time he reported being woke up by tightness in the middle of his chest.  He denied any other symptoms.  He reported that symptoms lasted for about an hour.  He felt that the symptoms were similar to indigestion he had previously daily experience.  At the time of the call he denied symptoms but reported occasional tingling in the left side of his chest.  He presented to the clinic 10/04/20 for follow-up evaluation stated he noticed an episode of chest pressure which he described as indigestion, that came on  the morning of 09/28/20-09/29/20.  It was relieved by drinking Sprite and taking Tylenol.  He continued to be very physically active working  full-time and walking around 6000 steps per day.  He did note some shortness of breath when he increased his physical activity such as going up 3 flights of stairs but did not have increased work of breathing with normal activities.  He denied exertional chest pain.  He reported some dietary indiscretion and reported he ate out regularly.  I  gave him the salty 6 diet sheet, had him maintain his physical activity.  We planned follow-up in 6 months.  No ischemic evaluation planned at this time.  He presents the clinic today for follow-up evaluation states he feels well today.  He does report occasional episodes of chest discomfort.  He denies exertional chest pain.  He also notes some shortness of breath but denies exertional shortness of breath.  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  He is planning on going to [facility] for the holidays.  I will give him the salty 6 diet sheet, have him maintain his physical activity, remind him of  his upcoming vascular studies, and plan follow-up in 6 months.  We will also have him repeat his fasting lipids and LFTs in April.  Today he denies chest pain, shortness of breath, lower extremity edema, fatigue, palpitations, melena, hematuria, hemoptysis, diaphoresis, weakness, presyncope, syncope, orthopnea, and PND.   Home Medications    Prior to Admission medications   Medication Sig Start Date End Date Taking? Authorizing Provider  amLODipine (NORVASC) 5 MG tablet Take 5 mg by mouth daily.     [provider]  clopidogrel (PLAVIX) 75 MG tablet TAKE 1 TABLET BY MOUTH DAILY Patient taking differently: Take 75 mg by mouth daily.  10/22/19   Tonia Ghent, MD  levothyroxine (SYNTHROID) 75 MCG tablet TAKE 1 TABLET BY MOUTH DAILY EXCEPT ON THURSDAYS TAKE 1.5 TABLETS Patient taking differently: Take 75 mcg by mouth daily before breakfast.  03/11/20   Tonia Ghent, MD  losartan (COZAAR) 100 MG  tablet Take 100 mg by mouth daily.  12/18/19   [provider]  metoprolol tartrate (LOPRESSOR) 25 MG tablet TAKE 1/2 TABLET TWICE DAILY 07/02/20   [name], MD  pramipexole (MIRAPEX) 1 MG tablet Take 1 mg by mouth 3 (three) times daily as needed (restless leg).  11/20/19   [provider]  pravastatin (PRAVACHOL) 40 MG tablet Take 1 tablet (40 mg total) by mouth daily. Please make an appointment for physical exam prior to further refills. 04/16/20   Tonia Ghent, MD  REPATHA SURECLICK 546 MG/ML SOAJ INJECT 140MG  EVERY 14 DAYS 09/08/20   [name], MD  tamsulosin (FLOMAX) 0.4 MG CAPS capsule Take 1 capsule (0.4 mg total) by mouth daily. 10/30/19   Abbie Sons, MD  traZODone (DESYREL) 100 MG tablet TAKE 1/2-1 TABLET BY MOUTH AT BEDTIME ASNEEDED FOR SLEEP 09/15/20   Tonia Ghent, MD  vitamin B-12 (CYANOCOBALAMIN) 1000 MCG tablet Take 1,000 mcg by mouth 4 (four) times a week.    [provider]    Family History    Family History  Problem Relation Age of Onset   Diabetes Mother    Stroke Father    Colon cancer Neg Hx    Prostate cancer Neg Hx    He indicated that his mother is deceased. He indicated that his father is deceased. He indicated that his sister is alive. He indicated that only one of his two brothers is alive. He indicated that the status of his neg hx is unknown.  Social History    Social History   Socioeconomic History   Marital status: Married    Spouse name: Not on file   Number of children: Not on file   Years of education: Not on file   Highest education level: Not on file  Occupational History   Not on file  Tobacco Use   Smoking status: Former    Types: Cigarettes    Quit date: 09/04/2008    Years since quitting: 12.9   Smokeless tobacco: Former    Types: Chew   Tobacco comments:    HAs quit tobacco products 2009  Vaping Use   Vaping Use: Never used  Substance and Sexual Activity   Alcohol use: Not Currently     Alcohol/week: 0.0 standard drinks   Drug use: No   Sexual activity: Yes  Other Topics Concern   Not on file  Social History Narrative   Married 50+ years, 2 kids   Guy to play golf  Social Determinants of Health   Financial Resource Strain: Low Risk    Difficulty of Paying Living Expenses: Not hard at all  Food Insecurity: No Food Insecurity   Worried About Charity fundraiser in the Last Year: Never true   [place] in the Last Year: Never true  Transportation Needs: No Transportation Needs   Lack of Transportation (Medical): No   Lack of Transportation (Non-Medical): No  Physical Activity: Inactive   Days of Exercise per Week: 0 days   Minutes of Exercise per Session: 0 min  Stress: No Stress Concern Present   Feeling of Stress : Not at all  Social Connections: Not on file  Intimate Partner Violence: Not At Risk   Fear of Current or Ex-Partner: No   Emotionally Abused: No   Physically Abused: No   Sexually Abused: No     Review of Systems    General:  No chills, fever, night sweats or weight changes.  Cardiovascular:  No chest pain, dyspnea on exertion, edema, orthopnea, palpitations, paroxysmal nocturnal dyspnea. Dermatological: No rash, lesions/masses Respiratory: No cough, dyspnea Urologic: No hematuria, dysuria Abdominal:   No nausea, vomiting, diarrhea, bright red blood per rectum, melena, or hematemesis Neurologic:  No visual changes, wkns, changes in mental status. All other systems reviewed and are otherwise negative except as noted above.  Physical Exam    VS:  BP (!) 170/82   Pulse 76   Ht 5\' 7"  (1.702 m)   Wt 155 lb (70.3 kg)   SpO2 98%   BMI 24.28 kg/m  , BMI Body mass index is 24.28 kg/m. GEN: Well nourished, well developed, in no acute distress. HEENT: normal. Neck: Supple, no JVD, carotid bruits, or masses. Cardiac: RRR, no murmurs, rubs, or gallops. No clubbing, cyanosis, edema.  Radials/DP/PT 2+ and equal  bilaterally.  Respiratory:  Respirations regular and unlabored, clear to auscultation bilaterally. GI: Soft, nontender, nondistended, BS + x 4. MS: no deformity or atrophy. Skin: warm and dry, no rash. Neuro:  Strength and sensation are intact. Psych: Normal affect.  Accessory Clinical Findings    Recent Labs: 12/30/2020: ALT 10; BUN 18; Creatinine, Ser 1.00; Hemoglobin 13.5; Platelets 244.0; Potassium 4.8; Sodium 138; TSH 1.42   Recent Lipid Panel    Component Value Date/Time   CHOL 143 12/30/2020 1128   CHOL 119 04/27/2020 0000   CHOL 227 11/15/2012 0000   TRIG 250.0 (H) 12/30/2020 1128   TRIG 209 (A) 04/27/2020 0000   HDL 68.30 12/30/2020 1128   HDL 71 10/23/2018 0853   CHOLHDL 2 12/30/2020 1128   VLDL 50.0 (H) 12/30/2020 1128   LDLCALC 23 04/27/2020 0000   LDLDIRECT 41.0 12/30/2020 1128    ECG personally reviewed by me today-none today.  EKG 07/27/2020 Normal sinus rhythm no ST or T wave deviation 68 bpm  Echocardiogram 08/12/2019 IMPRESSIONS     1. Left ventricular ejection fraction, by visual estimation, is 55 to  60%. The left ventricle has normal function. There is moderately increased  left ventricular hypertrophy.   2. Elevated left atrial pressure.   3. Left ventricular diastolic parameters are consistent with Grade I  diastolic dysfunction (impaired relaxation).   4. The left ventricle has no regional wall motion abnormalities.   5. Global right ventricle has normal systolic function.The right  ventricular size is mildly enlarged. No increase in right ventricular wall  thickness.   6. Left atrial size was mildly dilated.   7. Right atrial size was mildly  dilated.   8. The pericardium was not well visualized.   9. The mitral valve is degenerative. Mild mitral valve regurgitation.  10. The tricuspid valve is normal in structure. Tricuspid valve  regurgitation is trivial.  11. The aortic valve is tricuspid. Aortic valve regurgitation is not  visualized.  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  12. The pulmonic valve was not well visualized. Pulmonic valve  regurgitation is not visualized.  13. The interatrial septum was not well visualized.  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  History of renal artery stenosis renal Doppler study to 9/21 showed left renal aortic ratio of 4.5 with a renal dimension of 10.3. Repeat renal Doppler study 3/21 Continue amlodipine, losartan, metoprolol Heart healthy low-sodium diet Increase physical activity as tolerated Continue blood pressure log   Hyperlipidemia-12/30/2020: Cholesterol 143; HDL 68.30; Triglycerides 250.0; VLDL 50.0 Continue Repatha, pravastatin Heart healthy low-sodium high-fiber diet Increase physical activity as tolerated Repeat fasting lipids and LFTs 4/23  Coronary artery disease-no recent episodes of arm neck back or chest discomfort.  CABG x511/2019 Continue Repatha, pravastatin Heart healthy low-sodium diet Increase physical activity as tolerated  Peripheral vascular disease-denies claudication. Continue Repatha, pravastatin Heart healthy low-sodium diet-salty 6 given Increase physical activity as tolerated  Disposition: Follow-up with Dr. Gwenlyn Found or me in 6-9 months.  Jossie Ng. [place] Droll NP-C    08/04/2021, 9:37 AM [place] [address] Office [phone] Fax [phone]  Notice: This dictation was prepared with Dragon dictation along with smaller phrase technology. Any transcriptional errors that result from this process are unintentional and may not be corrected upon review.  I spent 15 minutes examining this patient, reviewing medications, and using patient centered shared decision making involving her cardiac care.  Prior to her visit I spent greater than 20 minutes reviewing her past medical history,   medications, and prior cardiac tests.

## 2021-08-04 ENCOUNTER — Other Ambulatory Visit: Payer: Self-pay

## 2021-08-04 ENCOUNTER — Encounter: Payer: Self-pay | Admitting: General Practice

## 2021-08-04 ENCOUNTER — Ambulatory Visit (INDEPENDENT_AMBULATORY_CARE_PROVIDER_SITE_OTHER): Payer: Medicare Other | Admitting: General Practice

## 2021-08-04 VITALS — BP 152/64 | HR 76 | Ht 67.0 in | Wt 155.0 lb

## 2021-08-04 DIAGNOSIS — Z79899 Other long term (current) drug therapy: Secondary | ICD-10-CM | POA: Diagnosis not present

## 2021-08-04 DIAGNOSIS — I150 Renovascular hypertension: Secondary | ICD-10-CM

## 2021-08-04 DIAGNOSIS — I739 Peripheral vascular disease, unspecified: Secondary | ICD-10-CM

## 2021-08-04 DIAGNOSIS — E782 Mixed hyperlipidemia: Secondary | ICD-10-CM

## 2021-08-04 DIAGNOSIS — I2510 Atherosclerotic heart disease of native coronary artery without angina pectoris: Secondary | ICD-10-CM | POA: Diagnosis not present

## 2021-08-04 DIAGNOSIS — I701 Atherosclerosis of renal artery: Secondary | ICD-10-CM

## 2021-08-04 NOTE — Patient Instructions (Signed)
Medication Instructions:  The current medical regimen is effective;  continue present plan and medications as directed. Please refer to the Current Medication list given to you today.   *If you need a refill on your cardiac medications before your next appointment, please call your pharmacy*  Lab Work: FASTING LIPID AND LFT IN April   If you have labs (blood work) drawn today and your tests are completely normal, you will receive your results only by:  [place] (if you have MyChart) OR A paper copy in the mail.  If you have any lab test that is abnormal or we need to change your treatment, we will call you to review the results. You may go to any [facility] that is convenient for you however, we do have a lab in our office that is able to assist you. You DO NOT need an appointment for our lab. The lab is open 8:00am and closes at 4:00pm. Lunch 12:45 - 1:45pm.  Testing/Procedures: MAKE SURE YOU HAVE YOUR TESTING DONE ON 08-18-2021  Special Instructions  PLEASE READ AND FOLLOW SALTY 6-ATTACHED-1,800 mg daily  PLEASE INCREASE PHYSICAL ACTIVITY AS TOLERATED  Follow-Up: Your next appointment:  6 month(s) In Person with [name], MD   Please call our office 2 months in advance to schedule this appointment  :1  At [facility], you and your health needs are our priority.  As part of our continuing mission to provide you with exceptional heart care, we have created designated Provider Care Teams.  These Care Teams include your primary Cardiologist (physician) and Advanced Practice Providers (APPs -  Physician Assistants and Nurse Practitioners) who all work together to provide you with the care you need, when you need it.

## 2021-08-05 DIAGNOSIS — I150 Renovascular hypertension: Secondary | ICD-10-CM | POA: Diagnosis not present

## 2021-08-05 DIAGNOSIS — E782 Mixed hyperlipidemia: Secondary | ICD-10-CM | POA: Diagnosis not present

## 2021-08-05 DIAGNOSIS — I2510 Atherosclerotic heart disease of native coronary artery without angina pectoris: Secondary | ICD-10-CM | POA: Diagnosis not present

## 2021-08-05 DIAGNOSIS — I739 Peripheral vascular disease, unspecified: Secondary | ICD-10-CM | POA: Diagnosis not present

## 2021-08-05 DIAGNOSIS — Z79899 Other long term (current) drug therapy: Secondary | ICD-10-CM | POA: Diagnosis not present

## 2021-08-06 LAB — HEPATIC FUNCTION PANEL
ALT: 15 IU/L (ref 0–44)
AST: 18 IU/L (ref 0–40)
Albumin: 4.5 g/dL (ref 3.6–4.6)
Alkaline Phosphatase: 49 IU/L (ref 44–121)
Bilirubin Total: 0.3 mg/dL (ref 0.0–1.2)
Bilirubin, Direct: 0.13 mg/dL (ref 0.00–0.40)
Total Protein: 6.4 g/dL (ref 6.0–8.5)

## 2021-08-06 LAB — LIPID PANEL
Chol/HDL Ratio: 1.9 ratio (ref 0.0–5.0)
Cholesterol, Total: 145 mg/dL (ref 100–199)
HDL: 78 mg/dL (ref >39)
LDL Chol Calc (NIH): 53 mg/dL (ref 0–99)
Triglycerides: 72 mg/dL (ref 0–149)
VLDL Cholesterol Cal: 14 mg/dL (ref 5–40)

## 2021-08-10 DIAGNOSIS — C44310 Basal cell carcinoma of skin of unspecified parts of face: Secondary | ICD-10-CM | POA: Diagnosis not present

## 2021-08-10 DIAGNOSIS — C44319 Basal cell carcinoma of skin of other parts of face: Secondary | ICD-10-CM | POA: Diagnosis not present

## 2021-08-16 ENCOUNTER — Telehealth: Payer: Self-pay | Admitting: Family Medicine

## 2021-08-16 NOTE — Chronic Care Management (AMB) (Signed)
°  Chronic Care Management   Note  08/16/2021 Name: JONHATAN HEARTY MRN: [id number] DOB: 1940-11-10  [name] is a 80 y.o. year old male who is a primary care patient of Tonia Ghent, MD. I reached out to [name] by phone today in response to a referral sent by Mr. Zaylin Runco Hamre's PCP, Tonia Ghent, MD.   Mr. Sensing was given information about Chronic Care Management services today including:  CCM service includes personalized support from designated clinical staff supervised by his physician, including individualized plan of care and coordination with other care providers 24/7 contact phone numbers for assistance for urgent and routine care needs. Service will only be billed when office clinical staff spend 20 minutes or more in a month to coordinate care. Only one practitioner may furnish and bill the service in a calendar month. The patient may stop CCM services at any time (effective at the end of the month) by phone call to the office staff.   Patient agreed to services and verbal consent obtained.   Follow up plan:   [place] Secretary/administrator

## 2021-08-18 ENCOUNTER — Ambulatory Visit (INDEPENDENT_AMBULATORY_CARE_PROVIDER_SITE_OTHER)
Admission: RE | Admit: 2021-08-18 | Discharge: 2021-08-18 | Disposition: A | Discharge status: Home / Self Care | Payer: Medicare Other | Source: Ambulatory Visit | Attending: Physician Assistant | Admitting: Physician Assistant

## 2021-08-18 ENCOUNTER — Other Ambulatory Visit: Payer: Self-pay

## 2021-08-18 ENCOUNTER — Ambulatory Visit (HOSPITAL_COMMUNITY)
Admission: RE | Admit: 2021-08-18 | Discharge: 2021-08-18 | Disposition: A | Discharge status: Home / Self Care | Payer: Medicare Other | Source: Ambulatory Visit | Attending: Physician Assistant | Admitting: Physician Assistant

## 2021-08-18 ENCOUNTER — Ambulatory Visit (INDEPENDENT_AMBULATORY_CARE_PROVIDER_SITE_OTHER): Payer: Medicare Other | Admitting: Physician Assistant

## 2021-08-18 VITALS — BP 163/62 | HR 58 | Temp 97.9°F | Resp 20 | Ht 67.0 in | Wt 159.1 lb

## 2021-08-18 DIAGNOSIS — I739 Peripheral vascular disease, unspecified: Secondary | ICD-10-CM

## 2021-08-18 DIAGNOSIS — I701 Atherosclerosis of renal artery: Secondary | ICD-10-CM

## 2021-08-18 NOTE — Progress Notes (Signed)
[facility] [place] HISTORY AND PHYSICAL   History of Present Illness:  Patient is a 80 y.o. year old male who presents for evaluation of PAD.  S/P right external iliac artery and common femoral artery with atherectomy and drug-coated balloon angioplasty of right superficial femoral artery by Dr. Trula Slade on 06/08/2020.  Surgical history also significant for right iliofemoral endarterectomy with bovine patch angioplasty on 02/13/2020 as well as history of prior right SFA stenting.  He also has a remote history of left femoral to popliteal artery bypass graft with vein.  He denise rest pain, claudication or non healing wounds.   The pt is on a statin for cholesterol management.    The pt is not on an aspirin.    Other AC:  Plavix The pt is on CCB, ARB, BB for hypertension.  The pt is not have diabetes. Tobacco hx:  former  Past Medical History:  Diagnosis Date   Anemia    due to GIB, s/p transfusion   Anginal pain ([place]) 07/10/2018   Arthritis    back pain, much worse after consecutive golf rounds   Cancer [facility])    thyroid   Chronic kidney disease    Colon polyps    Coronary artery disease    COVID-19    Dyspnea    walking up a hill   Dysrhythmia 2020   GERD (gastroesophageal reflux disease)    Hypercalcemia    h/o, resolved as of 2012, prev due to high amount of calcium intake   Hyperlipidemia    Hypertension    Hypothyroidism    IgG gammopathy    stable as of 2012 per [facility]   Pneumonia    as a child   PVD (peripheral vascular disease) ([place])    L leg bypass, R leg stented    Past Surgical History:  Procedure Laterality Date    dental implant left bottom-did have bleeding      ABDOMINAL AORTOGRAM W/LOWER EXTREMITY Bilateral 06/26/2019   Procedure: ABDOMINAL AORTOGRAM W/LOWER EXTREMITY;  Surgeon: [name], MD;  Location: [place] [facility];  Service: Cardiovascular;  Laterality: Bilateral;   ABDOMINAL AORTOGRAM W/LOWER EXTREMITY N/A 06/08/2020    Procedure: ABDOMINAL AORTOGRAM W/LOWER EXTREMITY;  Surgeon: Serafina Mitchell, MD;  Location: [place] [facility];  Service: Cardiovascular;  Laterality: N/A;   BYPASS GRAFT     L leg   CARDIAC CATHETERIZATION  06/24/2018   COLONOSCOPY WITH PROPOFOL N/A 05/04/2015   Procedure: COLONOSCOPY WITH PROPOFOL;  Surgeon: Lollie Sails, MD;  Location: [facility] ENDOSCOPY;  Service: Endoscopy;  Laterality: N/A;   CORONARY ARTERY BYPASS GRAFT N/A 07/11/2018   Procedure: CORONARY ARTERY BYPASS GRAFTING (CABG) x three, using left internal mammary artery and right leg greater saphenous vein harvested endoscopically;  Surgeon: Gaye Pollack, MD;  Location: [place] [facility];  Service: Open Heart Surgery;  Laterality: N/A;   ENDARTERECTOMY FEMORAL Right 02/13/2020   Procedure: RIGHT ENDARTERECTOMY FEMORAL WITH BOVINE PATCH;  Surgeon: Serafina Mitchell, MD;  Location: [place];  Service: Vascular;  Laterality: Right;   LEFT HEART CATH AND CORONARY ANGIOGRAPHY N/A 06/24/2018   Procedure: LEFT HEART CATH AND CORONARY ANGIOGRAPHY;  Surgeon: [name], MD;  Location: [place] [facility];  Service: Cardiovascular;  Laterality: N/A;   PERIPHERAL VASCULAR ATHERECTOMY Right 06/08/2020   Procedure: PERIPHERAL VASCULAR ATHERECTOMY;  Surgeon: Serafina Mitchell, MD;  Location: [place] [facility];  Service: Cardiovascular;  Laterality: Right;  superficial femoral   PERIPHERAL  VASCULAR BALLOON ANGIOPLASTY Right 06/08/2020   Procedure: PERIPHERAL VASCULAR BALLOON ANGIOPLASTY;  Surgeon: Serafina Mitchell, MD;  Location: [place] [facility];  Service: Cardiovascular;  Laterality: Right;  External iliac   TEE WITHOUT CARDIOVERSION N/A 07/11/2018   Procedure: TRANSESOPHAGEAL ECHOCARDIOGRAM (TEE);  Surgeon: Gaye Pollack, MD;  Location: [place];  Service: Open Heart Surgery;  Laterality: N/A;   THYROIDECTOMY, PARTIAL  2016   VASCULAR SURGERY      ROS:   General:  No weight loss, Fever, chills  HEENT: No recent headaches, no nasal bleeding, no  visual changes, no sore throat  Neurologic: No dizziness, blackouts, seizures. No recent symptoms of stroke or mini- stroke. No recent episodes of slurred speech, or temporary blindness.  Cardiac: No recent episodes of chest pain/pressure, no shortness of breath at rest.  No shortness of breath with exertion.  Denies history of atrial fibrillation or irregular heartbeat  Vascular: No history of rest pain in feet.  No history of claudication.  No history of non-healing ulcer, No history of DVT   Pulmonary: No home oxygen, no productive cough, no hemoptysis,  No asthma or wheezing  Musculoskeletal:  [ ]  Arthritis, [ ]  Low back pain,  [ ]  Joint pain  Hematologic:No history of hypercoagulable state.  No history of easy bleeding.  No history of anemia  Gastrointestinal: No hematochezia or melena,  No gastroesophageal reflux, no trouble swallowing  Urinary: [ ]  chronic Kidney disease, [ ]  on HD - [ ]  MWF or [ ]  TTHS, [ ]  Burning with urination, [ ]  Frequent urination, [ ]  Difficulty urinating;   Skin: No rashes  Psychological: No history of anxiety,  No history of depression  Social History Social History   Tobacco Use   Smoking status: Former    Types: Cigarettes    Quit date: 09/04/2008    Years since quitting: 12.9   Smokeless tobacco: Former    Types: Chew   Tobacco comments:    HAs quit tobacco products 2009  Vaping Use   Vaping Use: Never used  Substance Use Topics   Alcohol use: Not Currently    Alcohol/week: 0.0 standard drinks   Drug use: No    Family History Family History  Problem Relation Age of Onset   Diabetes Mother    Stroke Father    Colon cancer Neg Hx    Prostate cancer Neg Hx     Allergies  Allergies  Allergen Reactions   Penicillins Itching and Other (See Comments)    PATIENT HAS HAD A PCN REACTION WITH IMMEDIATE RASH, FACIAL/TONGUE/THROAT SWELLING, SOB, OR LIGHTHEADEDNESS WITH HYPOTENSION:  #  #  YES  #  #  Has patient had a PCN reaction causing  severe rash involving mucus membranes or skin necrosis: No Has patient had a PCN reaction that required hospitalization: No Has patient had a PCN reaction occurring within the last 10 years: No If all of the above answers are "NO", then may proceed with Cephalosporin use.    Ace Inhibitors Cough   Aspirin Other (See Comments)    H/o GI bleed   Celebrex [Celecoxib] Other (See Comments)    GI bleed   Lipitor [Atorvastatin] Other (See Comments)    myalgias     Current Outpatient Medications  Medication Sig Dispense Refill   amLODipine (NORVASC) 5 MG tablet Take 5 mg by mouth daily.      clopidogrel (PLAVIX) 75 MG tablet TAKE 1 TABLET BY MOUTH DAILY 90 tablet 0  hydrOXYzine (ATARAX/VISTARIL) 10 MG tablet Take 0.5-1 tablets (5-10 mg total) by mouth 3 (three) times daily as needed for anxiety. 30 tablet 3   levothyroxine (SYNTHROID) 75 MCG tablet TAKE 1 TABLET BY MOUTH DAILY EXCEPT ON THURSDAYS TAKE 1.5 TABLETS 100 tablet 3   losartan (COZAAR) 50 MG tablet Take 50 mg by mouth daily.     metoprolol tartrate (LOPRESSOR) 25 MG tablet TAKE 1/2 TABLET TWICE DAILY 90 tablet 3   pramipexole (MIRAPEX) 1 MG tablet Take 1 tablet (1 mg total) by mouth daily.     REPATHA SURECLICK 355 MG/ML SOAJ INJECT 140MG  EVERY 14 DAYS 2 mL 11   tamsulosin (FLOMAX) 0.4 MG CAPS capsule Take 1 capsule (0.4 mg total) by mouth daily. 90 capsule 3   pravastatin (PRAVACHOL) 40 MG tablet Skip for 2 weeks starting 12/30/20 and update Damita Dunnings (Patient not taking: Reported on 08/18/2021) 90 tablet 0   No current facility-administered medications for this visit.    Physical Examination  Vitals:   08/18/21 0923  BP: (!) 163/62  Pulse: (!) 58  Resp: 20  Temp: 97.9 F (36.6 C)  TempSrc: Temporal  SpO2: 97%  Weight: 159 lb 1.6 oz (72.2 kg)  Height: 5\' 7"  (1.702 m)    Body mass index is 24.92 kg/m.  General:  Alert and oriented, no acute distress HEENT: Normal Neck: No bruit or JVD Pulmonary: Clear to  auscultation bilaterally Cardiac: Regular Rate and Rhythm without murmur Abdomen: Soft, non-tender, non-distended, no mass, no scars Skin: No rash Musculoskeletal: No deformity or edema  Neurologic: Upper and lower extremity motor 5/5 and symmetric  DATA:     ABI Findings:  +---------+------------------+-----+----------+--------+   Right     Rt Pressure (mmHg) Index Waveform   Comment    +---------+------------------+-----+----------+--------+   Brachial  176                                            +---------+------------------+-----+----------+--------+   PTA       194                1.10  biphasic              +---------+------------------+-----+----------+--------+   DP        164                0.93  monophasic            +---------+------------------+-----+----------+--------+   Great Toe 47                 0.27                        +---------+------------------+-----+----------+--------+   +---------+------------------+-----+--------+-------+   Left      Lt Pressure (mmHg) Index Waveform Comment   +---------+------------------+-----+--------+-------+   Brachial  168                                         +---------+------------------+-----+--------+-------+   PTA       131                0.74  biphasic           +---------+------------------+-----+--------+-------+   DP        158  0.90  biphasic           +---------+------------------+-----+--------+-------+   Great Toe 64                 0.36                     +---------+------------------+-----+--------+-------+   +-------+-----------+-----------+------------+------------+   ABI/TBI Today's ABI Today's TBI Previous ABI Previous TBI   +-------+-----------+-----------+------------+------------+   Right   1.10        0.27        1.05         0.22           +-------+-----------+-----------+------------+------------+   Left    0.90        0.36        1.08         0.34            +-------+-----------+-----------+------------+------------+       Right ABIs appear essentially unchanged compared to prior study on  04/19/2021. Compared to prior study on 04/19/2021.     Summary:  Right: Resting right ankle-brachial index is within normal range. No  evidence of significant right lower extremity arterial disease. The right  toe-brachial index is abnormal.   Left: Resting left ankle-brachial index indicates mild left lower  extremity arterial disease. The left toe-brachial index is abnormal.   +----------+--------+-----+---------------+----------+--------+   RIGHT      PSV cm/s Ratio Stenosis        Waveform   Comments   +----------+--------+-----+---------------+----------+--------+   EIA Prox   151                            biphasic              +----------+--------+-----+---------------+----------+--------+   EIA Mid    297            30-49% stenosis biphasic              +----------+--------+-----+---------------+----------+--------+   EIA Distal 161                            biphasic              +----------+--------+-----+---------------+----------+--------+   CFA Distal 140                            monophasic            +----------+--------+-----+---------------+----------+--------+   DFA        426            50-74% stenosis monophasic            +----------+--------+-----+---------------+----------+--------+   SFA Prox   134                            monophasic            +----------+--------+-----+---------------+----------+--------+   SFA Mid    157                            monophasic            +----------+--------+-----+---------------+----------+--------+   SFA Distal 77  monophasic            +----------+--------+-----+---------------+----------+--------+   POP Prox   73                             monophasic            +----------+--------+-----+---------------+----------+--------+   POP Mid    161                             monophasic            +----------+--------+-----+---------------+----------+--------+        Right Stent(s):  +-------------------+--------+---------------+----------+--------+   Mid to Distal stent PSV cm/s Stenosis        Waveform   Comments   +-------------------+--------+---------------+----------+--------+   Prox to Stent       310      50-99% stenosis monophasic            +-------------------+--------+---------------+----------+--------+   Proximal Stent      260                      monophasic            +-------------------+--------+---------------+----------+--------+   Mid Stent           81                       monophasic            +-------------------+--------+---------------+----------+--------+   Distal Stent        96                       monophasic            +-------------------+--------+---------------+----------+--------+   Distal to Stent     89                       monophasic            +-------------------+--------+---------------+----------+--------+               +----------+--------+-----+--------+--------+--------+   LEFT       PSV cm/s Ratio Stenosis Waveform Comments   +----------+--------+-----+--------+--------+--------+   EIA Distal 235                     biphasic            +----------+--------+-----+--------+--------+--------+   CFA Prox   205                     biphasic            +----------+--------+-----+--------+--------+--------+      Left Graft #1: Fem - pop  +--------------------+--------+--------+--------+--------+                        PSV cm/s Stenosis Waveform Comments   +--------------------+--------+--------+--------+--------+   Inflow               153               biphasic            +--------------------+--------+--------+--------+--------+   Proximal Anastomosis 45                biphasic            +--------------------+--------+--------+--------+--------+   Proximal Graft  88                 biphasic            +--------------------+--------+--------+--------+--------+   Mid Graft            78                biphasic            +--------------------+--------+--------+--------+--------+   Distal Graft         36                biphasic            +--------------------+--------+--------+--------+--------+   Distal Anastomosis   93                biphasic            +--------------------+--------+--------+--------+--------+   Outflow              94                biphasic            +--------------------+--------+--------+--------+--------+   Summary:  Right: 50-74% stenosis noted in the deep femoral artery. 50-74% stenosis  at the proximal end of the SFA stent.   Left: 30-49% stenosis noted in the iliac segment. Patent left fem-pop  bypass graft with no evidence of restenosis.   ASSESSMENT/PLAN:  PAD s/p S/P right external iliac artery and common femoral artery with atherectomy and drug-coated balloon angioplasty of right superficial femoral artery by Dr. Trula Slade on 06/08/2020.  Surgical history also significant for right iliofemoral endarterectomy with bovine patch angioplasty on 02/13/2020 as well as history of prior right SFA stenting.  He also has a remote history of left femoral to popliteal artery bypass graft with vein.  He remains asymptomatic for ischemic changes to B LE.  His duplex shows right 50-74% stenosis and left 30-49%.  The ABI's are stable with no change from 6 months ago.  He will continue activity as tolerates and f/u in 6 months for repeat studies.  If he develops symptoms of ischemia which we reviewed he will call our office.      Roxy Horseman PA-C  [facility] [place] Office: [phone]  MD in clinic [place]

## 2021-08-19 ENCOUNTER — Other Ambulatory Visit: Payer: Self-pay

## 2021-08-19 DIAGNOSIS — I739 Peripheral vascular disease, unspecified: Secondary | ICD-10-CM

## 2021-08-25 ENCOUNTER — Emergency Department: Payer: Medicare Other

## 2021-08-25 ENCOUNTER — Emergency Department
Admission: EM | Admit: 2021-08-25 | Discharge: 2021-08-25 | Disposition: A | Discharge status: Home / Self Care | Payer: Medicare Other | Attending: Emergency Medicine | Admitting: Emergency Medicine

## 2021-08-25 ENCOUNTER — Encounter: Payer: Self-pay | Admitting: Emergency Medicine

## 2021-08-25 ENCOUNTER — Other Ambulatory Visit: Payer: Self-pay

## 2021-08-25 DIAGNOSIS — I129 Hypertensive chronic kidney disease with stage 1 through stage 4 chronic kidney disease, or unspecified chronic kidney disease: Secondary | ICD-10-CM | POA: Insufficient documentation

## 2021-08-25 DIAGNOSIS — R778 Other specified abnormalities of plasma proteins: Secondary | ICD-10-CM

## 2021-08-25 DIAGNOSIS — Z85828 Personal history of other malignant neoplasm of skin: Secondary | ICD-10-CM | POA: Diagnosis not present

## 2021-08-25 DIAGNOSIS — D631 Anemia in chronic kidney disease: Secondary | ICD-10-CM | POA: Diagnosis not present

## 2021-08-25 DIAGNOSIS — Z85850 Personal history of malignant neoplasm of thyroid: Secondary | ICD-10-CM | POA: Insufficient documentation

## 2021-08-25 DIAGNOSIS — I2510 Atherosclerotic heart disease of native coronary artery without angina pectoris: Secondary | ICD-10-CM | POA: Diagnosis not present

## 2021-08-25 DIAGNOSIS — R079 Chest pain, unspecified: Secondary | ICD-10-CM | POA: Diagnosis not present

## 2021-08-25 DIAGNOSIS — I4891 Unspecified atrial fibrillation: Secondary | ICD-10-CM | POA: Diagnosis not present

## 2021-08-25 DIAGNOSIS — J40 Bronchitis, not specified as acute or chronic: Secondary | ICD-10-CM | POA: Diagnosis not present

## 2021-08-25 DIAGNOSIS — Z951 Presence of aortocoronary bypass graft: Secondary | ICD-10-CM | POA: Insufficient documentation

## 2021-08-25 DIAGNOSIS — Z8616 Personal history of COVID-19: Secondary | ICD-10-CM | POA: Insufficient documentation

## 2021-08-25 DIAGNOSIS — E039 Hypothyroidism, unspecified: Secondary | ICD-10-CM | POA: Insufficient documentation

## 2021-08-25 DIAGNOSIS — R0789 Other chest pain: Secondary | ICD-10-CM | POA: Diagnosis not present

## 2021-08-25 DIAGNOSIS — Z7902 Long term (current) use of antithrombotics/antiplatelets: Secondary | ICD-10-CM | POA: Diagnosis not present

## 2021-08-25 DIAGNOSIS — Z20822 Contact with and (suspected) exposure to covid-19: Secondary | ICD-10-CM | POA: Insufficient documentation

## 2021-08-25 DIAGNOSIS — R7989 Other specified abnormal findings of blood chemistry: Secondary | ICD-10-CM | POA: Insufficient documentation

## 2021-08-25 DIAGNOSIS — Z79899 Other long term (current) drug therapy: Secondary | ICD-10-CM | POA: Diagnosis not present

## 2021-08-25 DIAGNOSIS — N183 Chronic kidney disease, stage 3 unspecified: Secondary | ICD-10-CM | POA: Diagnosis not present

## 2021-08-25 LAB — BASIC METABOLIC PANEL
Anion gap: 8 (ref 5–15)
BUN: 35 mg/dL — ABNORMAL HIGH (ref 8–23)
CO2: 20 mmol/L — ABNORMAL LOW (ref 22–32)
Calcium: 9.3 mg/dL (ref 8.9–10.3)
Chloride: 110 mmol/L (ref 98–111)
Creatinine, Ser: 1.17 mg/dL (ref 0.61–1.24)
GFR, Estimated: >60 mL/min (ref >60)
Glucose, Bld: 124 mg/dL — ABNORMAL HIGH (ref 70–99)
Potassium: 4.1 mmol/L (ref 3.5–5.1)
Sodium: 138 mmol/L (ref 135–145)

## 2021-08-25 LAB — CBC
HCT: 37.2 % — ABNORMAL LOW (ref 39.0–52.0)
Hemoglobin: 12.3 g/dL — ABNORMAL LOW (ref 13.0–17.0)
MCH: 31.3 pg (ref 26.0–34.0)
MCHC: 33.1 g/dL (ref 30.0–36.0)
MCV: 94.7 fL (ref 80.0–100.0)
Platelets: 195 10*3/uL (ref 150–400)
RBC: 3.93 MIL/uL — ABNORMAL LOW (ref 4.22–5.81)
RDW: 15.3 % (ref 11.5–15.5)
WBC: 10.4 10*3/uL (ref 4.0–10.5)
nRBC: 0 % (ref 0.0–0.2)

## 2021-08-25 LAB — TSH: TSH: 2.246 u[IU]/mL (ref 0.350–4.500)

## 2021-08-25 LAB — MAGNESIUM: Magnesium: 2.1 mg/dL (ref 1.7–2.4)

## 2021-08-25 LAB — RESP PANEL BY RT-PCR (FLU A&B, COVID) ARPGX2
Influenza A by PCR: NEGATIVE
Influenza B by PCR: NEGATIVE
SARS Coronavirus 2 by RT PCR: NEGATIVE

## 2021-08-25 LAB — TROPONIN I (HIGH SENSITIVITY)
Troponin I (High Sensitivity): 13 ng/L (ref <18)
Troponin I (High Sensitivity): 22 ng/L — ABNORMAL HIGH (ref <18)

## 2021-08-25 MED ORDER — METOPROLOL TARTRATE 25 MG PO TABS
12.5000 mg | ORAL_TABLET | ORAL | Status: AC
  Administered 2021-08-25: 06:00:00 12.5 mg via ORAL
  Filled 2021-08-25: qty 1

## 2021-08-25 MED ORDER — METOPROLOL TARTRATE 25 MG PO TABS
12.5000 mg | ORAL_TABLET | ORAL | Status: AC
  Administered 2021-08-25: 04:00:00 12.5 mg via ORAL
  Filled 2021-08-25: qty 1

## 2021-08-25 MED ORDER — METOPROLOL TARTRATE 5 MG/5ML IV SOLN
5.0000 mg | Freq: Once | INTRAVENOUS | Status: AC
  Administered 2021-08-25: 04:00:00 5 mg via INTRAVENOUS
  Filled 2021-08-25: qty 5

## 2021-08-25 MED ORDER — METOPROLOL TARTRATE 5 MG/5ML IV SOLN
5.0000 mg | Freq: Once | INTRAVENOUS | Status: AC
  Administered 2021-08-25: 06:00:00 5 mg via INTRAVENOUS
  Filled 2021-08-25: qty 5

## 2021-08-25 MED ORDER — BENZONATATE 100 MG PO CAPS: 100.0000 mg | ORAL_CAPSULE | Freq: Three times a day (TID) | ORAL | 0 refills | Status: AC | PRN

## 2021-08-25 MED ORDER — NITROGLYCERIN 0.4 MG SL SUBL: 0.4000 mg | SUBLINGUAL_TABLET | SUBLINGUAL | Status: DC | PRN

## 2021-08-25 NOTE — Progress Notes (Addendum)
Cardiology Office Note   Date:  08/26/2021   ID:  Ryan Pittman, DOB March 24, 1941, MRN [id number]  PCP:  Tonia Ghent, MD  Cardiologist: Dr. Gwenlyn Found No chief complaint on file.    History of Present Illness: Ryan Pittman is a 80 y.o. male who presents for ongoing assessment and management of coronary artery disease, undergoing coronary CTA on 05/24/2018 which showed a calcium score of 2958 with three-vessel calcification.  He did undergo cardiac catheterization by Dr. Gwenlyn Found on 2019 revealing three-vessel disease with preserved LVEF and is status post CABG X 5, by Dr. Caffie Pinto on 09/10/2017.  The patient also has PAD, with renal artery disease with renal artery stenosis status post peripheral angiography on 06/26/2019 which showed a 90% left renal artery stenosis, patent left femoral-popliteal bypass grafting,  right common iliac artery stent, and a 90% calcified right popliteal stenosis.  He subsequently had a right common femoral endarterectomy and patch angioplasty by Dr. Trula Slade he also underwent several subsequent procedures including orbital atherectomy.  Additionally, the patient has a history of carotid artery stenosis hypertension , paroxysmal atrial fibrillation, and chronic kidney disease stage III.  He has had intermittent chest pressure and associated shortness of breath since January 2022.  He was seen in the office on 08/04/2021 by Coletta Memos, FNP, for follow-up evaluation stating that he was doing well and did not report any further episodes of chest discomfort or exertional chest discomfort.  He did note some mild exertional shortness of breath.  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  The plan was to go to [facility] for the holidays.  Unfortunately, the patient presented to the emergency room at [facility] on 08/25/2021, with recurrent chest pain.  He complained of left-sided chest pain radiating toward his  shoulder.  It awoke him from sleep and lasted about 2 hours resolved after he took some omeprazole and 1 baby aspirin.  The patient thought it may have been related to indigestion but he wanted to be checked out.  He stated he had had hot wings for dinner.    He reported that he was medically compliant. He was ruled out for ACS.  His EKG was remarkable for atrial fibrillation with a rapid ventricular rate of 119 bpm.  Chest x-ray was negative for pneumonia, CHF, or other pathology.  ED physician did speak with cardiology, Dr. Lovena Le, he was treated with IV metoprolol followed by p.o. metoprolol.  Dr. Lovena Le did not recommend any additional trending of his troponin and increase the patient's metoprolol to 25 mg twice daily for the next couple of days until he is seen by cardiology.   He comes today with his wife, this patient has a complicated medical history as above.  Currently he is asymptomatic but his wife and he do admit to him having frequent episodes of "heartburn" and "nervousness" at which time he does feel his heart racing on occasion.  He does take omeprazole for the indigestion as needed.    Past Medical History:  Diagnosis Date   Anemia    due to GIB, s/p transfusion   Anginal pain ([place]) 07/10/2018   Arthritis    back pain, much worse after consecutive golf rounds   Cancer [facility])    thyroid   Chronic kidney disease    Colon polyps    Coronary artery disease    COVID-19    Dyspnea    walking up a hill   Dysrhythmia  2020   GERD (gastroesophageal reflux disease)    Hypercalcemia    h/o, resolved as of 2012, prev due to high amount of calcium intake   Hyperlipidemia    Hypertension    Hypothyroidism    IgG gammopathy    stable as of 2012 per [facility]   Pneumonia    as a child   PVD (peripheral vascular disease) ([place])    L leg bypass, R leg stented    Past Surgical History:  Procedure Laterality Date    dental implant left bottom-did have bleeding      ABDOMINAL AORTOGRAM  W/LOWER EXTREMITY Bilateral 06/26/2019   Procedure: ABDOMINAL AORTOGRAM W/LOWER EXTREMITY;  Surgeon: [name], MD;  Location: [place] CV LAB;  Service: Cardiovascular;  Laterality: Bilateral;   ABDOMINAL AORTOGRAM W/LOWER EXTREMITY N/A 06/08/2020   Procedure: ABDOMINAL AORTOGRAM W/LOWER EXTREMITY;  Surgeon: Serafina Mitchell, MD;  Location: [place] CV LAB;  Service: Cardiovascular;  Laterality: N/A;   BYPASS GRAFT     L leg   CARDIAC CATHETERIZATION  06/24/2018   COLONOSCOPY WITH PROPOFOL N/A 05/04/2015   Procedure: COLONOSCOPY WITH PROPOFOL;  Surgeon: Lollie Sails, MD;  Location: [facility] ENDOSCOPY;  Service: Endoscopy;  Laterality: N/A;   CORONARY ARTERY BYPASS GRAFT N/A 07/11/2018   Procedure: CORONARY ARTERY BYPASS GRAFTING (CABG) x three, using left internal mammary artery and right leg greater saphenous vein harvested endoscopically;  Surgeon: Gaye Pollack, MD;  Location: [place] OR;  Service: Open Heart Surgery;  Laterality: N/A;   ENDARTERECTOMY FEMORAL Right 02/13/2020   Procedure: RIGHT ENDARTERECTOMY FEMORAL WITH BOVINE PATCH;  Surgeon: Serafina Mitchell, MD;  Location: [place];  Service: Vascular;  Laterality: Right;   LEFT HEART CATH AND CORONARY ANGIOGRAPHY N/A 06/24/2018   Procedure: LEFT HEART CATH AND CORONARY ANGIOGRAPHY;  Surgeon: [name], MD;  Location: [place] CV LAB;  Service: Cardiovascular;  Laterality: N/A;   PERIPHERAL VASCULAR ATHERECTOMY Right 06/08/2020   Procedure: PERIPHERAL VASCULAR ATHERECTOMY;  Surgeon: Serafina Mitchell, MD;  Location: [place] CV LAB;  Service: Cardiovascular;  Laterality: Right;  superficial femoral   PERIPHERAL VASCULAR BALLOON ANGIOPLASTY Right 06/08/2020   Procedure: PERIPHERAL VASCULAR BALLOON ANGIOPLASTY;  Surgeon: Serafina Mitchell, MD;  Location: [place] CV LAB;  Service: Cardiovascular;  Laterality: Right;  External iliac   TEE WITHOUT CARDIOVERSION N/A 07/11/2018   Procedure: TRANSESOPHAGEAL ECHOCARDIOGRAM  (TEE);  Surgeon: Gaye Pollack, MD;  Location: [place];  Service: Open Heart Surgery;  Laterality: N/A;   THYROIDECTOMY, PARTIAL  2016   VASCULAR SURGERY       Current Outpatient Medications  Medication Sig Dispense Refill   amLODipine (NORVASC) 5 MG tablet Take 5 mg by mouth daily.      benzonatate (TESSALON PERLES) 100 MG capsule Take 1 capsule (100 mg total) by mouth 3 (three) times daily as needed for up to 5 days for cough. 15 capsule 0   clopidogrel (PLAVIX) 75 MG tablet TAKE 1 TABLET BY MOUTH DAILY 90 tablet 0   hydrOXYzine (ATARAX/VISTARIL) 10 MG tablet Take 0.5-1 tablets (5-10 mg total) by mouth 3 (three) times daily as needed for anxiety. 30 tablet 3   levothyroxine (SYNTHROID) 75 MCG tablet TAKE 1 TABLET BY MOUTH DAILY EXCEPT ON THURSDAYS TAKE 1.5 TABLETS 100 tablet 3   losartan (COZAAR) 50 MG tablet Take 50 mg by mouth daily.     metoprolol tartrate (LOPRESSOR) 25 MG tablet TAKE 1/2 TABLET TWICE DAILY 90 tablet 3   pramipexole (  MIRAPEX) 1 MG tablet Take 1 tablet (1 mg total) by mouth daily.     pravastatin (PRAVACHOL) 40 MG tablet Skip for 2 weeks starting 12/30/20 and update Damita Dunnings (Patient not taking: Reported on 08/18/2021) 90 tablet 0   REPATHA SURECLICK 465 MG/ML SOAJ INJECT 140MG EVERY 14 DAYS 2 mL 11   sulfamethoxazole-trimethoprim (BACTRIM) 400-80 MG tablet Take 1 tablet by mouth 3 (three) times a week.     tamsulosin (FLOMAX) 0.4 MG CAPS capsule Take 1 capsule (0.4 mg total) by mouth daily. 90 capsule 3   traZODone (DESYREL) 100 MG tablet Take 50-100 mg by mouth at bedtime as needed.     No current facility-administered medications for this visit.    Allergies:   Penicillins, Ace inhibitors, Aspirin, Celebrex [celecoxib], and Lipitor [atorvastatin]    Social History:  The patient  reports that he quit smoking about 12 years ago. His smoking use included cigarettes. He has quit using smokeless tobacco.  His smokeless tobacco use included chew. He reports that he does  not currently use alcohol. He reports that he does not use drugs.   Family History:  The patient's family history includes Diabetes in his mother; Stroke in his father.    ROS: All other systems are reviewed and negative. Unless otherwise mentioned in H&P    PHYSICAL EXAM: VS:  BP (!) 182/60 (BP Location: Left Arm, Patient Position: Sitting, Cuff Size: Normal)    Pulse 60    Ht '5\' 7"'  (1.702 m)    Wt 161 lb (73 kg)    SpO2 97%    BMI 25.22 kg/m  , BMI Body mass index is 25.22 kg/m. GEN: Well nourished, well developed, in no acute distress HEENT: normal Neck: no JVD, carotid bruits, or masses Cardiac: RRR; high-pitched 2/6 systolic murmur heard best at the left sternal border, low pitched 2/6 systolic murmur heard best at the right sternal border, no  rubs, or gallops,no edema  Respiratory:  Clear to auscultation bilaterally, normal work of breathing, occasional cough without any wheezes noted. GI: soft, nontender, nondistended, + BS MS: no deformity or atrophy Skin: warm and dry, no rash Neuro:  Strength and sensation are intact Psych: euthymic mood, full affect   EKG:  EKG is ordered today. The ekg ordered today demonstrates normal sinus rhythm, heart rate of 60 bpm.   Recent Labs: 08/05/2021: ALT 15 08/25/2021: BUN 35; Creatinine, Ser 1.17; Hemoglobin 12.3; Magnesium 2.1; Platelets 195; Potassium 4.1; Sodium 138; TSH 2.246    Lipid Panel    Component Value Date/Time   CHOL 145 08/05/2021 0925   CHOL 119 04/27/2020 0000   CHOL 227 11/15/2012 0000   TRIG 72 08/05/2021 0925   TRIG 209 (A) 04/27/2020 0000   HDL 78 08/05/2021 0925   CHOLHDL 1.9 08/05/2021 0925   CHOLHDL 2 12/30/2020 1128   VLDL 50.0 (H) 12/30/2020 1128   LDLCALC 53 08/05/2021 0925   LDLCALC 23 04/27/2020 0000   LDLDIRECT 41.0 12/30/2020 1128      Wt Readings from Last 3 Encounters:  08/26/21 161 lb (73 kg)  08/25/21 154 lb (69.9 kg)  08/18/21 159 lb 1.6 oz (72.2 kg)      Other studies  Reviewed: ABI 08/18/2021 Summary:  Right: Resting right ankle-brachial index is within normal range. No  evidence of significant right lower extremity arterial disease. The right  toe-brachial index is abnormal.   Left: Resting left ankle-brachial index indicates mild left lower  extremity arterial disease. The left toe-brachial index  is abnormal.   Echocardiogram 02/10/2021 1. Left ventricular ejection fraction, by estimation, is 55 to 60%. The  left ventricle has normal function. The left ventricle has no regional  wall motion abnormalities. Left ventricular diastolic parameters were  normal.   2. Right ventricular systolic function is normal. The right ventricular  size is normal.   3. The mitral valve is normal in structure. Trivial mitral valve  regurgitation.   4. The aortic valve is normal in structure. Aortic valve regurgitation is  not visualized.   Left Heart Cath 06/24/2018 Ost Cx to Prox Cx lesion is 90% stenosed. Prox LAD to Mid LAD lesion is 60% stenosed. Ost 2nd Mrg lesion is 80% stenosed. Prox RCA to Mid RCA lesion is 80% stenosed. The left ventricular systolic function is normal. LV end diastolic pressure is normal. The left ventricular ejection fraction is 55-65% by visual estimate.  CABG 07/12/2018 Median Sternotomy Extracorporeal circulation 3.   Coronary artery bypass grafting x 5   Left internal mammary graft to the LAD Sequential SVR to OM1, OM2, and OM3 SVG to distal RCA   4.   Endoscopic vein harvest from the right leg  Carotid Doppler Ultrasound 02/13/2018 IMPRESSION: Mild bilateral carotid atherosclerosis. No hemodynamically significant ICA stenosis. Degree of narrowing less than 50% bilaterally by ultrasound criteria.  ASSESSMENT AND PLAN:  1.  Paroxysmal atrial fibrillation: The patient was found to have A. fib with RVR and emergency room 24 hours ago, was treated with IV metoprolol and increased dose of metoprolol tartrate from 12.5 mg twice  daily to 25 mg twice daily for 5 days, and then return back to 12.5 mg twice daily thereafter.  The patient states she sometimes feels "nervousness" in his chest and does feel his heart flutter or race.  It is not always associated with chest pain or chest pressure.  I will place a 2-week cardiac monitor to evaluate for frequency and morphology of his heart rate, and evaluate how often he is in and out of atrial fibrillation.  Due to CHA2DS2-VASc score of 4 (hypertension, age, vascular disease, he has a 5% stroke risk.  I will cautiously start him on Eliquis 5 mg twice daily.  Most recent creatinine 1.7, GFR greater than 60.  He does have a history of a duodenal ulcer approximately 20 years ago and was taken off of aspirin at that time.  As result of this I want to see him close follow-up in 2 weeks with a CBC.  He is given instructions concerning any hemoptysis, melena, epistaxis, abdominal pain, that he is to call us immediately.  Addendum: After further reflection and review of the patient's history I have decided not to place him on Eliquis.  I think the risk of bleeding out weighs the risk of CVA.  I have called the patient and told him not to take the Eliquis. He had not taken the dose yet.  I spoke to his wife who verbalized understanding. I have also notified Dr. Gwenlyn Found, his primary cardiologist of my decision.  08/27/2021   3.  Coronary artery disease: History of three-vessel coronary artery disease status post CABG on 09/10/2017.  He is on secondary preventative management to include statin therapy, weight management, and healthy exercise.  If symptoms are persistent we may need to consider repeat catheterization although it has been only 4 years since bypass.  4.  Hypertension: Blood pressure is elevated today in the office.  I have really taken it in the office and it  remains elevated at 188/88.  I am considering increasing his amlodipine however, at home his blood pressures better controlled  between 130 and 150 according to his wife.  I have given him a blood pressure recording sheet.  They are to take his blood pressure daily at the same time every day after he has been seated with both feet on the floor for 5 minutes and record.  He is to bring these blood pressure recordings back with him on next appointment in 2 weeks.  4.  Peripheral arterial disease, with RAS, patent left femoral bypass grafting, right common iliac artery stent.  The patient remains on Plavix 75 mg daily.  Follow-up CBC and instructions concerning any issues with bleeding.  5.  Chronic kidney disease stage III: I have reviewed his most recent labs from the emergency room with creatinine of 1.7, GFR greater than 60.  He will have a follow-up BMET in 1 week prior to being seen again in 2 weeks.  6.  GERD: I have asked the patient to take omeprazole daily instead of as needed.  Hopefully this will help him differentiate between indigestion and any cardiac issue.  Again, he is educated on any bleeding hemoptysis hematic emesis, to report to [place] immediately.  7.  Carotid artery stenosis: Last carotid Doppler study was in 2019 with less than 50% stenosis noted.  Can consider repeating this he does have bilateral carotid bruits which may be radiation from his aortic valve.  As not to overwhelm him with testing we will continue to follow this and repeat this testing once we see him again.  8.  Hypercholesterolemia, remain on statin therapy with follow-up lipids and LFTs once he is fasting.  The patient should have a goal of an LDL less than 50 with multiple cardiovascular risk factors, to include CABG, peripheral arterial disease.   Current medicines are reviewed at length with the patient today.  I have spent 45 min's  dedicated to the care of this patient on the date of this encounter to include pre-visit review of records, assessment, management and diagnostic testing,with shared decision making.  Labs/ tests ordered today  include: 2-week cardiac monitor, echocardiogram, c-Met, CBC, magnesium.  See again in 2 weeks with labs in 1 week.  Phill Myron. West Pugh, ANP, [facility]   08/26/2021 11:27 AM    [facility] [place] [address] Office [phone] Fax [phone]  Notice: This dictation was prepared with Dragon dictation along with smaller phrase technology. Any transcriptional errors that result from this process are unintentional and may not be corrected upon review.

## 2021-08-25 NOTE — ED Provider Notes (Signed)
[facility] Emergency Department Provider Note  ____________________________________________   Event Date/Time   First MD Initiated Contact with Patient 08/25/21 [phone]     (approximate)  I have reviewed the triage vital signs and the nursing notes.   HISTORY  Chief Complaint Chest Pain   HPI Ryan Pittman is a 80 y.o. male with a PMH of PVD, carotid stenosis, hypertension, renal artery stenosis, paroxysmal atrial fibrillation, CAD s/p CABG, CKD stage III, and PAD who presents for assessment of some left-sided chest pain rating towards the shoulder.  Patient states it woke him up from sleep a couple hours ago last about 2 hours and resolved after he took some omeprazole and 1 baby aspirin.  States he thinks it was related to indigestion but wanted to get his heart checked out as well.  He did have hot wings for dinner.  He states he also has had a mild cough since yesterday.  He does not have any shortness of breath, fevers, headache, earache, sore throat, back pain, abdominal pain, nausea, vomiting, diarrhea, rash, burning with urination and has not had any recent falls or injuries.  Endorses remote tobacco abuse without any recent tobacco abuse, EtOH use or illicit drug use.  Patient states he is compliant with all his medications and took 12.5 metoprolol around 9 PM which she typically does.  He is not on any other blood thinners aside from Plavix at this time.         Past Medical History:  Diagnosis Date   Anemia    due to GIB, s/p transfusion   Anginal pain ([place]) 07/10/2018   Arthritis    back pain, much worse after consecutive golf rounds   Cancer [facility])    thyroid   Chronic kidney disease    Colon polyps    Coronary artery disease    COVID-19    Dyspnea    walking up a hill   Dysrhythmia 2020   GERD (gastroesophageal reflux disease)    Hypercalcemia    h/o, resolved as of 2012, prev due to high amount of calcium intake   Hyperlipidemia     Hypertension    Hypothyroidism    IgG gammopathy    stable as of 2012 per [facility]   Pneumonia    as a child   PVD (peripheral vascular disease) ([place])    L leg bypass, R leg stented    Patient Active Problem List   Diagnosis Date Noted   Other social stressor 01/02/2021   Murmur 01/02/2021   PAD (peripheral artery disease) ([place]) 02/13/2020   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   Idiopathic hypercalcemia 08/03/2019   Ankle pain 07/02/2019   CAD (coronary artery disease) 08/08/2018   PAF (paroxysmal atrial fibrillation) ([place]) 08/06/2018   Hx of CABG 07/11/2018   Health care maintenance 04/04/2018   Atypical chest pain 04/04/2018   Hypothyroidism 03/30/2017   Advance care planning 03/30/2017   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   Renal artery stenosis ([place]) 07/04/2016   BCC (basal cell carcinoma), 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   Hyperglycemia 12/28/2015   Hyperlipidemia 12/28/2015   Thyroid cancer ([place]) 11/12/2014   Shortness of breath 04/22/2014   Spasm 04/22/2014   RLS (restless legs syndrome) 11/16/2013   Insomnia 06/18/2013   Medicare annual wellness visit, subsequent 03/12/2012   HTN (hypertension) 08/14/2011   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   CKD (chronic kidney disease) stage 3, GFR 30-59 ml/min ([place]) 02/13/2011   OA (osteoarthritis) 02/13/2011  Carotid stenosis 02/13/2011   ED (erectile dysfunction) 02/13/2011   Duodenal ulcer 02/13/2011   Diverticulosis 02/13/2011   PVD (peripheral vascular disease) ([place]) 01/26/2011   S/P bypass graft of extremity 01/26/2011   Back pain 01/26/2011   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   Nocturia 05/12/2008   Urinary hesitancy 05/12/2008    Past Surgical History:  Procedure Laterality Date    dental implant left bottom-did have bleeding      ABDOMINAL AORTOGRAM W/LOWER EXTREMITY Bilateral 06/26/2019   Procedure: ABDOMINAL AORTOGRAM W/LOWER EXTREMITY;  Surgeon: [name], MD;  Location: [place] [facility];  Service: Cardiovascular;  Laterality: Bilateral;   ABDOMINAL AORTOGRAM W/LOWER EXTREMITY N/A 06/08/2020   Procedure: ABDOMINAL AORTOGRAM W/LOWER EXTREMITY;  Surgeon: Serafina Mitchell, MD;  Location: [place] [facility];  Service: Cardiovascular;  Laterality: N/A;   BYPASS GRAFT     L leg   CARDIAC CATHETERIZATION  06/24/2018   COLONOSCOPY WITH PROPOFOL N/A 05/04/2015   Procedure: COLONOSCOPY WITH PROPOFOL;  Surgeon: Lollie Sails, MD;  Location: [facility];  Service: Endoscopy;  Laterality: N/A;   CORONARY ARTERY BYPASS GRAFT N/A 07/11/2018   Procedure: CORONARY ARTERY BYPASS GRAFTING (CABG) x three, using left internal mammary artery and right leg greater saphenous vein harvested endoscopically;  Surgeon: Gaye Pollack, MD;  Location: [place] OR;  Service: Open Heart Surgery;  Laterality: N/A;   ENDARTERECTOMY FEMORAL Right 02/13/2020   Procedure: RIGHT ENDARTERECTOMY FEMORAL WITH BOVINE PATCH;  Surgeon: Serafina Mitchell, MD;  Location: [place];  Service: Vascular;  Laterality: Right;   LEFT HEART CATH AND CORONARY ANGIOGRAPHY N/A 06/24/2018   Procedure: LEFT HEART CATH AND CORONARY ANGIOGRAPHY;  Surgeon: [name], MD;  Location: [place] [facility];  Service: Cardiovascular;  Laterality: N/A;   PERIPHERAL VASCULAR ATHERECTOMY Right 06/08/2020   Procedure: PERIPHERAL VASCULAR ATHERECTOMY;  Surgeon: Serafina Mitchell, MD;  Location: [place] [facility];  Service: Cardiovascular;  Laterality: Right;  superficial femoral   PERIPHERAL VASCULAR BALLOON ANGIOPLASTY Right 06/08/2020   Procedure: PERIPHERAL VASCULAR BALLOON ANGIOPLASTY;  Surgeon: Serafina Mitchell, MD;  Location: [place] [facility];  Service: Cardiovascular;  Laterality: Right;  External iliac   TEE WITHOUT CARDIOVERSION N/A 07/11/2018   Procedure: TRANSESOPHAGEAL ECHOCARDIOGRAM (TEE);  Surgeon: Gaye Pollack, MD;  Location: [place];  Service: Open Heart Surgery;  Laterality: N/A;   THYROIDECTOMY, PARTIAL  2016    VASCULAR SURGERY      Prior to Admission medications   Medication Sig Start Date End Date Taking? Authorizing Provider  amLODipine (NORVASC) 5 MG tablet Take 5 mg by mouth daily.    Yes [provider]  benzonatate (TESSALON PERLES) 100 MG capsule Take 1 capsule (100 mg total) by mouth 3 (three) times daily as needed for up to 5 days for cough. 08/25/21 08/30/21 Yes Lucrezia Starch, MD  clopidogrel (PLAVIX) 75 MG tablet TAKE 1 TABLET BY MOUTH DAILY 06/08/21  Yes Tonia Ghent, MD  hydrOXYzine (ATARAX/VISTARIL) 10 MG tablet Take 0.5-1 tablets (5-10 mg total) by mouth 3 (three) times daily as needed for anxiety. 12/30/20  Yes Tonia Ghent, MD  levothyroxine (SYNTHROID) 75 MCG tablet TAKE 1 TABLET BY MOUTH DAILY EXCEPT ON THURSDAYS TAKE 1.5 TABLETS 03/11/20  Yes Tonia Ghent, MD  losartan (COZAAR) 50 MG tablet Take 50 mg by mouth daily. 02/11/21  Yes [provider]  metoprolol tartrate (LOPRESSOR) 25 MG tablet TAKE 1/2 TABLET TWICE DAILY 03/30/21  Yes [name], MD  pramipexole (MIRAPEX) 1 MG tablet Take 1 tablet (1 mg total) by mouth daily. 12/30/20  Yes Tonia Ghent, MD  REPATHA SURECLICK 734 MG/ML SOAJ INJECT 140MG  EVERY 14 DAYS 07/25/21  Yes [name], MD  sulfamethoxazole-trimethoprim (BACTRIM) 400-80 MG tablet Take 1 tablet by mouth 3 (three) times a week. 08/23/21  Yes [provider]  tamsulosin (FLOMAX) 0.4 MG CAPS capsule Take 1 capsule (0.4 mg total) by mouth daily. 11/05/20  Yes Stoioff, Ronda Fairly, MD  traZODone (DESYREL) 100 MG tablet Take 50-100 mg by mouth at bedtime as needed. 08/23/21  Yes [provider]  pravastatin (PRAVACHOL) 40 MG tablet Skip for 2 weeks starting 12/30/20 and update [place] Patient not taking: Reported on 08/18/2021 12/30/20   Tonia Ghent, MD    Allergies Penicillins, Ace inhibitors, Aspirin, Celebrex [celecoxib], and Lipitor [atorvastatin]  Family History  Problem Relation Age of Onset    Diabetes Mother    Stroke Father    Colon cancer Neg Hx    Prostate cancer Neg Hx     Social History Social History   Tobacco Use   Smoking status: Former    Types: Cigarettes    Quit date: 09/04/2008    Years since quitting: 12.9   Smokeless tobacco: Former    Types: Chew   Tobacco comments:    HAs quit tobacco products 2009  Vaping Use   Vaping Use: Never used  Substance Use Topics   Alcohol use: Not Currently    Alcohol/week: 0.0 standard drinks   Drug use: No    Review of Systems  Review of Systems  Constitutional:  Negative for chills and fever.  HENT:  Negative for sore throat.   Eyes:  Negative for pain.  Respiratory:  Negative for cough and stridor.   Cardiovascular:  Positive for chest pain.  Gastrointestinal:  Negative for vomiting.  Skin:  Negative for rash.  Neurological:  Negative for seizures, loss of consciousness and headaches.  Psychiatric/Behavioral:  Negative for suicidal ideas.   All other systems reviewed and are negative.    ____________________________________________   PHYSICAL EXAM:  VITAL SIGNS: ED Triage Vitals  Enc Vitals Group     BP 08/25/21 0303 (!) 136/100     Pulse Rate 08/25/21 0303 97     Resp 08/25/21 0303 18     Temp 08/25/21 0303 98 F (36.7 C)     Temp Source 08/25/21 0303 Oral     SpO2 08/25/21 0303 94 %     Weight 08/25/21 0301 154 lb (69.9 kg)     Height 08/25/21 0301 5\' 7"  (1.702 m)     Head Circumference --      Peak Flow --      Pain Score 08/25/21 0301 8     Pain Loc --      Pain Edu? --      Excl. in [place]? --    Vitals:   08/25/21 0530 08/25/21 0600  BP: 140/73 120/85  Pulse: 60 82  Resp: 16 (!) 24  Temp:    SpO2: 97% 98%   Physical Exam Vitals and nursing note reviewed.  Constitutional:      General: He is not in acute distress.    Appearance: He is well-developed.  HENT:     Head: Normocephalic and atraumatic.     Right Ear: External ear normal.     Left Ear: External ear normal.     Nose:  Nose normal.  Eyes:  Conjunctiva/sclera: Conjunctivae normal.  Cardiovascular:     Rate and Rhythm: Tachycardia present. Rhythm irregular.     Heart sounds: No murmur heard. Pulmonary:     Effort: Pulmonary effort is normal. No respiratory distress.     Breath sounds: Normal breath sounds.  Abdominal:     Palpations: Abdomen is soft.     Tenderness: There is no abdominal tenderness.  Musculoskeletal:        General: No swelling.     Cervical back: Neck supple.  Skin:    General: Skin is warm and dry.     Capillary Refill: Capillary refill takes less than 2 seconds.  Neurological:     Mental Status: He is alert.  Psychiatric:        Mood and Affect: Mood normal.     ____________________________________________   LABS (all labs ordered are listed, but only abnormal results are displayed)  Labs Reviewed  CBC - Abnormal; Notable for the following components:      Result Value   RBC 3.93 (*)    Hemoglobin 12.3 (*)    HCT 37.2 (*)    All other components within normal limits  BASIC METABOLIC PANEL - Abnormal; Notable for the following components:   CO2 20 (*)    Glucose, Bld 124 (*)    BUN 35 (*)    All other components within normal limits  TROPONIN I (HIGH SENSITIVITY) - Abnormal; Notable for the following components:   Troponin I (High Sensitivity) 22 (*)    All other components within normal limits  RESP PANEL BY RT-PCR (FLU A&B, COVID) ARPGX2  MAGNESIUM  TSH  TROPONIN I (HIGH SENSITIVITY)   ____________________________________________  EKG  ECG remarkable for A. fib with a ventricular rate of 119, normal axis, unremarkable intervals without other clear evidence of acute ischemia or significant arrhythmia. ____________________________________________  RADIOLOGY  ED MD interpretation: Chest x-ray shows no focal consolidation, effusion, edema, pneumothorax or other clear acute process.  There is evidence of patient's prior CABG surgery and  sternotomy.  Official radiology report(s): DG Chest Port 1 View  Result Date: 08/25/2021 CLINICAL DATA:  Left-sided chest pain. EXAM: PORTABLE CHEST 1 VIEW COMPARISON:  May 12, 2021 FINDINGS: Multiple sternal wires and vascular clips are present. There is no evidence of acute infiltrate, pleural effusion or pneumothorax. The heart size and mediastinal contours are within normal limits. The visualized skeletal structures are unremarkable. IMPRESSION: 1. Evidence of prior median sternotomy/CABG. 2. No acute or active cardiopulmonary disease. Electronically Signed   By: Virgina Norfolk M.D.   On: 08/25/2021 03:32    ____________________________________________   PROCEDURES  Procedure(s) performed (including Critical Care):  .1-3 Lead EKG Interpretation Performed by: Lucrezia Starch, MD Authorized by: Lucrezia Starch, MD     Interpretation: non-specific     ECG rate assessment: tachycardic     Rhythm: atrial fibrillation     Ectopy: none     Conduction: normal     ____________________________________________   INITIAL IMPRESSION / ASSESSMENT AND PLAN / ED COURSE        Patient presents with above-stated history exam for assessment of some chest pain that woke him from sleep and resolved prior to arrival.  On arrival patient is afebrile hemodynamically stable slightly tachycardic with apparent A. fib on the monitor and arrival ECG.  His lungs are clear bilaterally and his abdomen is soft.  Differential considerations include ACS, pneumonia, bronchitis, anemia, metabolic derangements, GERD with a lower suspicion for PE at this  time given resolution of symptoms prior to arrival without clear risk factors and patient denying any associated shortness of breath.  He describes as burning-like in overall I have low suspicion for dissection at this time.   Chest x-ray shows no focal consolidation, effusion, edema, pneumothorax or other clear acute process.  There is evidence of  patient's prior CABG surgery and sternotomy.  COVID and influenza PCR is negative.  Magnesium and TSH are within normal limits.  CBC shows no leukocytosis or significant acute anemia.  BMP shows no significant electrolyte or metabolic derangements although bicarb is slightly below normal at 20.  ECG remarkable for A. fib with a ventricular rate of 119, normal axis, unremarkable intervals without other clear evidence of acute ischemia or significant arrhythmia.  Troponin did uptrend slightly from 13-22 although suspect this represents a mild demand in the setting of mild RVR.  Patient given some IV metoprolol followed by p.o. metoprolol.  On several reassessments he was noted to maintain his heart rate below 110.  He is denying any chest pain on several reassessments over 3 and half hours in the emergency room.  Discussed with on-call cardiologist Dr. Lovena Le who recommended no additional trending of troponin and increasing patient's metoprolol to 25 twice daily for the next couple days until he can be seen by his cardiologist.  I think this is reasonable as patient's blood pressure and heart rate have otherwise been appropriate here.  Suspect cough is from a non-COVID or influenza bronchitis.  Will write Rx for Tessalon.  Given stable vitals on several reassessments with otherwise reassuring exam work-up and patient stating he is feeling at baseline and I think he is stable for discharge with close outpatient cardiology follow-up.  Discharged in stable condition.  Strict return precautions advised and discussed.      ____________________________________________   FINAL CLINICAL IMPRESSION(S) / ED DIAGNOSES  Final diagnoses:  Chest pain, unspecified type  Atrial fibrillation with RVR (HCC)  Bronchitis  Troponin I above reference range  S/P CABG (coronary artery bypass graft)    Medications  nitroGLYCERIN (NITROSTAT) SL tablet 0.4 mg (has no administration in time range)  metoprolol tartrate  (LOPRESSOR) tablet 12.5 mg (12.5 mg Oral Given 08/25/21 0353)  metoprolol tartrate (LOPRESSOR) injection 5 mg (5 mg Intravenous Given 08/25/21 0353)  metoprolol tartrate (LOPRESSOR) injection 5 mg (5 mg Intravenous Given 08/25/21 0609)  metoprolol tartrate (LOPRESSOR) tablet 12.5 mg (12.5 mg Oral Given 08/25/21 0608)     ED Discharge Orders          Ordered    benzonatate (TESSALON PERLES) 100 MG capsule  3 times daily PRN        08/25/21 8756             Note:  This document was prepared using Dragon voice recognition software and may include unintentional dictation errors.    Lucrezia Starch, MD 08/25/21 [phone]

## 2021-08-25 NOTE — ED Notes (Signed)
Red top, blue top and 2 sst tubes sent to lab  with save labels

## 2021-08-25 NOTE — ED Triage Notes (Signed)
Patient ambulatory to triage with steady gait, without difficulty or distress noted; pt reports mid CP radiating into left shoulder for several hrs;st pain decreased now after time med for indigestion and ASA

## 2021-08-25 NOTE — Discharge Instructions (Addendum)
As we discussed please increase your metoprolol to 25 mg twice a day for the next 5 days or unless otherwise directed to change the sooner by your cardiologist.

## 2021-08-26 ENCOUNTER — Ambulatory Visit (INDEPENDENT_AMBULATORY_CARE_PROVIDER_SITE_OTHER): Payer: Medicare Other

## 2021-08-26 ENCOUNTER — Encounter: Payer: Self-pay | Admitting: Adult Health

## 2021-08-26 ENCOUNTER — Ambulatory Visit (INDEPENDENT_AMBULATORY_CARE_PROVIDER_SITE_OTHER): Payer: Medicare Other | Admitting: Adult Health

## 2021-08-26 VITALS — BP 182/60 | HR 60 | Ht 67.0 in | Wt 161.0 lb

## 2021-08-26 DIAGNOSIS — I480 Paroxysmal atrial fibrillation: Secondary | ICD-10-CM

## 2021-08-26 DIAGNOSIS — N1831 Chronic kidney disease, stage 3a: Secondary | ICD-10-CM

## 2021-08-26 DIAGNOSIS — I6523 Occlusion and stenosis of bilateral carotid arteries: Secondary | ICD-10-CM | POA: Diagnosis not present

## 2021-08-26 DIAGNOSIS — Z951 Presence of aortocoronary bypass graft: Secondary | ICD-10-CM

## 2021-08-26 DIAGNOSIS — I059 Rheumatic mitral valve disease, unspecified: Secondary | ICD-10-CM

## 2021-08-26 DIAGNOSIS — I2510 Atherosclerotic heart disease of native coronary artery without angina pectoris: Secondary | ICD-10-CM | POA: Diagnosis not present

## 2021-08-26 DIAGNOSIS — I10 Essential (primary) hypertension: Secondary | ICD-10-CM

## 2021-08-26 DIAGNOSIS — K5790 Diverticulosis of intestine, part unspecified, without perforation or abscess without bleeding: Secondary | ICD-10-CM

## 2021-08-26 DIAGNOSIS — E039 Hypothyroidism, unspecified: Secondary | ICD-10-CM

## 2021-08-26 DIAGNOSIS — I359 Nonrheumatic aortic valve disorder, unspecified: Secondary | ICD-10-CM | POA: Diagnosis not present

## 2021-08-26 DIAGNOSIS — I739 Peripheral vascular disease, unspecified: Secondary | ICD-10-CM | POA: Diagnosis not present

## 2021-08-26 DIAGNOSIS — I701 Atherosclerosis of renal artery: Secondary | ICD-10-CM | POA: Diagnosis not present

## 2021-08-26 DIAGNOSIS — I358 Other nonrheumatic aortic valve disorders: Secondary | ICD-10-CM

## 2021-08-26 DIAGNOSIS — E7800 Pure hypercholesterolemia, unspecified: Secondary | ICD-10-CM

## 2021-08-26 MED ORDER — METOPROLOL TARTRATE 25 MG PO TABS: 12.5000 mg | ORAL_TABLET | Freq: Two times a day (BID) | ORAL | 3 refills | Status: DC

## 2021-08-26 MED ORDER — OMEPRAZOLE 20 MG PO CPDR: 20.0000 mg | DELAYED_RELEASE_CAPSULE | Freq: Every day | ORAL | 6 refills | Status: DC

## 2021-08-26 MED ORDER — APIXABAN 5 MG PO TABS
5.0000 mg | ORAL_TABLET | Freq: Two times a day (BID) | ORAL | 3 refills | Status: DC
Start: 2021-08-26 — End: 2021-08-27

## 2021-08-26 NOTE — Progress Notes (Unsigned)
Enrolled for [employer] to mail a ZIO XT long term holter monitor to the patients address on file.  

## 2021-08-26 NOTE — Patient Instructions (Signed)
Medication Instructions:  Take Eliquis 5 mg twice a day.  Take metoprolol tartrate 12.5 mg (one half tablet) twice a day.  Take omeprazole 20 mg each day.  *If you need a refill on your cardiac medications before your next appointment, please call your pharmacy*   Lab Work: In one week, return for blood work (CBC, BMET, Mg) If you have labs (blood work) drawn today and your tests are completely normal, you will receive your results only by: MyChart Message (if you have MyChart) OR A paper copy in the mail If you have any lab test that is abnormal or we need to change your treatment, we will call you to review the results.   Testing/Procedures: Your physician has requested that you have an echocardiogram. Echocardiography is a painless test that uses sound waves to create images of your heart. It provides your doctor with information about the size and shape of your heart and how well your hearts chambers and valves are working. This procedure takes approximately one hour. There are no restrictions for this procedure.  ZIO XT- Long Term Monitor Instructions  Your physician has requested you wear a ZIO patch monitor for 14 days.  This is a single patch monitor. [employer] supplies one patch monitor per enrollment. Additional stickers are not available. Please do not apply patch if you will be having a Nuclear Stress Test,  Echocardiogram, Cardiac CT, MRI, or Chest Xray during the period you would be wearing the  monitor. The patch cannot be worn during these tests. You cannot remove and re-apply the  ZIO XT patch monitor.  Your ZIO patch monitor will be mailed 3 day USPS to your address on file. It may take 3-5 days  to receive your monitor after you have been enrolled.  Once you have received your monitor, please review the enclosed instructions. Your monitor  has already been registered assigning a specific monitor serial # to you.  Billing and Patient Assistance Program  Information  We have supplied [employer] with any of your insurance information on file for billing purposes. [employer] offers a sliding scale Patient Assistance Program for patients that do not have  insurance, or whose insurance does not completely cover the cost of the ZIO monitor.  You must apply for the Patient Assistance Program to qualify for this discounted rate.  To apply, please call [employer] at [phone], select option 4, select option 2, ask to apply for  Patient Assistance Program. Theodore Demark will ask your household income, and how many people  are in your household. They will quote your out-of-pocket cost based on that information.  [employer] will also be able to set up a 73-month, interest-free payment plan if needed.  Applying the monitor   Shave hair from upper left chest.  Hold abrader disc by orange tab. Rub abrader in 40 strokes over the upper left chest as  indicated in your monitor instructions.  Clean area with 4 enclosed alcohol pads. Let dry.  Apply patch as indicated in monitor instructions. Patch will be placed under collarbone on left  side of chest with arrow pointing upward.  Rub patch adhesive wings for 2 minutes. Remove white label marked "1". Remove the white  label marked "2". Rub patch adhesive wings for 2 additional minutes.  While looking in a mirror, press and release button in center of patch. A small green light will  flash 3-4 times. This will be your only indicator that the monitor has been turned on.  Do not  shower for the first 24 hours. You may shower after the first 24 hours.  Press the button if you feel a symptom. You will hear a small click. Record Date, Time and  Symptom in the Patient Logbook.  When you are ready to remove the patch, follow instructions on the last 2 pages of Patient  Logbook. Stick patch monitor onto the last page of Patient Logbook.  Place Patient Logbook in the blue and white box. Use locking tab on box and tape box closed   securely. The blue and white box has prepaid postage on it. Please place it in the mailbox as  soon as possible. Your physician should have your test results approximately 7 days after the  monitor has been mailed back to [facility].  Call [facility] at [phone] if you have questions regarding  your ZIO XT patch monitor. Call them immediately if you see an orange light blinking on your  monitor.  If your monitor falls off in less than 4 days, contact our Monitor department at [phone].  If your monitor becomes loose or falls off after 4 days call [employer] at [phone] for  suggestions on securing your monitor   Follow-Up: At [facility], you and your health needs are our priority.  As part of our continuing mission to provide you with exceptional heart care, we have created designated Provider Care Teams.  These Care Teams include your primary Cardiologist (physician) and Advanced Practice Providers (APPs -  Physician Assistants and Nurse Practitioners) who all work together to provide you with the care you need, when you need it.  We recommend signing up for the patient portal called "MyChart".  Sign up information is provided on this After Visit Summary.  MyChart is used to connect with patients for Virtual Visits (Telemedicine).  Patients are able to view lab/test results, encounter notes, upcoming appointments, etc.  Non-urgent messages can be sent to your provider as well.   To learn more about what you can do with MyChart, go to [email or web address].    Your next appointment:   2 week(s)  The format for your next appointment:   In Person  Provider:   Jory Sims, DNP, ANP       Other Instructions Keep a log of your blood pressure: check blood pressure every day at the same time, in the same arm. Bring the log with you to your next appointment.  If you have any bleeding, bruising, dark stool, call the clinic immediately.

## 2021-08-27 NOTE — Addendum Note (Signed)
Addended by: Lendon Colonel on: 08/27/2021 11:41 AM   Modules accepted: Orders

## 2021-09-01 DIAGNOSIS — I059 Rheumatic mitral valve disease, unspecified: Secondary | ICD-10-CM | POA: Diagnosis not present

## 2021-09-01 DIAGNOSIS — I10 Essential (primary) hypertension: Secondary | ICD-10-CM | POA: Diagnosis not present

## 2021-09-01 DIAGNOSIS — I359 Nonrheumatic aortic valve disorder, unspecified: Secondary | ICD-10-CM | POA: Diagnosis not present

## 2021-09-01 LAB — BASIC METABOLIC PANEL
BUN/Creatinine Ratio: 13 (ref 10–24)
BUN: 18 mg/dL (ref 8–27)
CO2: 22 mmol/L (ref 20–29)
Calcium: 9.3 mg/dL (ref 8.6–10.2)
Chloride: 104 mmol/L (ref 96–106)
Creatinine, Ser: 1.4 mg/dL — ABNORMAL HIGH (ref 0.76–1.27)
Glucose: 108 mg/dL — ABNORMAL HIGH (ref 70–99)
Potassium: 4.7 mmol/L (ref 3.5–5.2)
Sodium: 139 mmol/L (ref 134–144)
eGFR: 50 mL/min/{1.73_m2} — ABNORMAL LOW (ref >59)

## 2021-09-01 LAB — CBC
Hematocrit: 38.7 % (ref 37.5–51.0)
Hemoglobin: 13 g/dL (ref 13.0–17.7)
MCH: 31.3 pg (ref 26.6–33.0)
MCHC: 33.6 g/dL (ref 31.5–35.7)
MCV: 93 fL (ref 79–97)
Platelets: 207 10*3/uL (ref 150–450)
RBC: 4.15 x10E6/uL (ref 4.14–5.80)
RDW: 14.7 % (ref 11.6–15.4)
WBC: 13.8 10*3/uL — ABNORMAL HIGH (ref 3.4–10.8)

## 2021-09-01 LAB — MAGNESIUM: Magnesium: 2.1 mg/dL (ref 1.6–2.3)

## 2021-09-02 DIAGNOSIS — I480 Paroxysmal atrial fibrillation: Secondary | ICD-10-CM

## 2021-09-05 DIAGNOSIS — R809 Proteinuria, unspecified: Secondary | ICD-10-CM | POA: Diagnosis not present

## 2021-09-05 DIAGNOSIS — N182 Chronic kidney disease, stage 2 (mild): Secondary | ICD-10-CM | POA: Diagnosis not present

## 2021-09-05 DIAGNOSIS — D631 Anemia in chronic kidney disease: Secondary | ICD-10-CM | POA: Diagnosis not present

## 2021-09-05 DIAGNOSIS — I10 Essential (primary) hypertension: Secondary | ICD-10-CM | POA: Diagnosis not present

## 2021-09-07 ENCOUNTER — Telehealth: Payer: Self-pay | Admitting: Family Medicine

## 2021-09-07 ENCOUNTER — Telehealth: Payer: Self-pay

## 2021-09-07 DIAGNOSIS — I10 Essential (primary) hypertension: Secondary | ICD-10-CM

## 2021-09-07 MED ORDER — LOSARTAN POTASSIUM 25 MG PO TABS: 25.0000 mg | ORAL_TABLET | Freq: Every day | ORAL | 3 refills | Status: DC

## 2021-09-07 MED ORDER — AMLODIPINE BESYLATE 5 MG PO TABS
7.5000 mg | ORAL_TABLET | Freq: Every day | ORAL | 1 refills | Status: DC
Start: 2021-09-07 — End: 2021-09-09

## 2021-09-07 NOTE — Telephone Encounter (Signed)
-----   Message from Lendon Colonel, NP sent at 09/02/2021  7:52 AM EST ----- Labs are reviewed.  Elevation in white blood cells.  See PCP for further work up per PCP.  Was elevated in April this year.  No evidence of anemia. Creatinine elevated compared to earlier this month. Will need to decrease losartan to 25 mg daily and increase amlodipine to 7.5 mg daily. Repeat the BMET in 2 weeks.

## 2021-09-07 NOTE — Telephone Encounter (Signed)
Mrs. Pryce called in and said his BP is up and he is quite anemic, and they changed his BP PILL and he has kidney failure and he went to that dr on Monday and they told him to work on his BP and both doctors have called in BP medication.

## 2021-09-07 NOTE — Telephone Encounter (Signed)
And wanted to see if he can squeezed in sooner than next week.

## 2021-09-08 ENCOUNTER — Telehealth: Payer: Self-pay | Admitting: Adult Health

## 2021-09-08 NOTE — Telephone Encounter (Signed)
Spoke with Ryan Pittman and his wife. Explained that I had a discussion with Dr. Gwenlyn Found and with Dr. Caryl Comes concerning anticoagulation and antiplatelet medication in the setting of PAF and PAD.  It has been recommended that he start Eliquis 5 mg BID, and stop Plavix.  I have explained this to him and to his wife. They verbalize understanding. I will see him on follow up on 09/16/2021.  At that time BMET and CBC can be drawn.

## 2021-09-09 ENCOUNTER — Encounter: Payer: Self-pay | Admitting: Family Medicine

## 2021-09-09 ENCOUNTER — Ambulatory Visit (INDEPENDENT_AMBULATORY_CARE_PROVIDER_SITE_OTHER): Payer: Medicare Other | Admitting: Family Medicine

## 2021-09-09 ENCOUNTER — Other Ambulatory Visit: Payer: Self-pay

## 2021-09-09 ENCOUNTER — Encounter: Payer: Self-pay | Admitting: Cardiovascular Disease

## 2021-09-09 DIAGNOSIS — I480 Paroxysmal atrial fibrillation: Secondary | ICD-10-CM | POA: Diagnosis not present

## 2021-09-09 MED ORDER — DOXYCYCLINE HYCLATE 100 MG PO TABS: 100.0000 mg | ORAL_TABLET | Freq: Two times a day (BID) | ORAL | 0 refills | Status: DC

## 2021-09-09 MED ORDER — LOSARTAN POTASSIUM 100 MG PO TABS: 100.0000 mg | ORAL_TABLET | Freq: Every day | ORAL | Status: DC

## 2021-09-09 MED ORDER — AMLODIPINE BESYLATE 10 MG PO TABS: 10.0000 mg | ORAL_TABLET | Freq: Every day | ORAL | Status: DC

## 2021-09-09 NOTE — Progress Notes (Signed)
This visit occurred during the SARS-CoV-2 public health emergency.  Safety protocols were in place, including screening questions prior to the visit, additional usage of staff PPE, and extensive cleaning of exam room while observing appropriate contact time as indicated for disinfecting solutions.  We talked about his multiple recent evaluations, records reviewed from cardiology and emergency room. A fib on ER eval 08/25/21.  Cough is a little better but still coughing some.  Some occ sputum.  Yellow sputum.  No fevers.    There was a previous discussion about anticoagulation.  He was prev advised not to take eliquis by cardiology.   Is on metoprolol; Losartan and amlodipine inc'd recently.  He is seeing nephrology recently, med changes discussed with patient.  He has plan for follow-up labs next week.  Most recent HGB 13.1.  Most recent Cr 1.2 We talked about taking amlodipine 10mg  for now along with losartan 100 mg.  He has been on those medications at that dose for the last 2 days.  He is in the midst of A. fib work-up. He has monitor on currently.  Echo pending.    Meds, vitals, and allergies reviewed.   ROS: Per HPI unless specifically indicated in ROS section   GEN: nad, alert and oriented HEENT: ncat NECK: supple w/o LA CV: rrr.  PULM: ctab, no inc wob ABD: soft, +bs EXT: no edema SKIN: Well-perfused

## 2021-09-09 NOTE — Patient Instructions (Addendum)
Keep taking losartan 100mg  a day and amlodipine 10mg  a day.   I would continue 12.5mg  (1/2 tab) of metoprolol twice a day.  If you have heart racing above 100, you can take an extra 1/2 tab of metoprolol and call cardiology for help.    Continue plavix and hold eliquis for now.   I'll check with cardiology in the meantime.    Hold septra while taking doxycyline.  Start doxycycline and let me know if the cough doesn't get better.   Take care.  Glad to see you.

## 2021-09-11 NOTE — Assessment & Plan Note (Addendum)
With monitor in place and echo pending.  He recently had amlodipine and losartan increased due to hypertension.  He sounds to be in regular rate and rhythm today and his pulse is reasonable.  He is already taking metoprolol 12.5 mg twice a day.  We talked about the rationale to not increase his metoprolol since his pulse is reasonable.  It would take a few more days for him to have full effect from blood pressure medication increase (amlodipine and losartan).  He has follow-up labs pending for next week.  I think it makes sense to leave his antihypertensives/beta-blocker as they are for now.  He is already on Plavix.  I will defer any change in anticoagulation to cardiology but I would like cardiology input about the plan going forward.  The patient mainly wanted to make sure that all of his doctors are on the same page about his medication going forward.  I routed this note to cardiology and nephrology to facilitate that.  I appreciate the help of all involved.  It is noted that he still has a cough with some yellow sputum production.  His lungs are clear but given the duration of start doxycycline.  Hold Septra in the meantime.  See after visit summary.  He agrees with plan.  40 minutes were devoted to patient care in this encounter (this includes time spent reviewing the patient's file/history, interviewing and examining the patient, counseling/reviewing plan with patient).

## 2021-09-13 ENCOUNTER — Ambulatory Visit (HOSPITAL_COMMUNITY): Payer: Medicare Other | Attending: Cardiology

## 2021-09-13 ENCOUNTER — Encounter (HOSPITAL_COMMUNITY): Payer: Self-pay | Admitting: Physician Assistant

## 2021-09-14 ENCOUNTER — Other Ambulatory Visit: Payer: Self-pay

## 2021-09-14 ENCOUNTER — Telehealth: Payer: Self-pay | Admitting: Adult Health

## 2021-09-14 DIAGNOSIS — I25119 Atherosclerotic heart disease of native coronary artery with unspecified angina pectoris: Secondary | ICD-10-CM

## 2021-09-14 DIAGNOSIS — I739 Peripheral vascular disease, unspecified: Secondary | ICD-10-CM

## 2021-09-14 DIAGNOSIS — I480 Paroxysmal atrial fibrillation: Secondary | ICD-10-CM

## 2021-09-14 DIAGNOSIS — I10 Essential (primary) hypertension: Secondary | ICD-10-CM | POA: Diagnosis not present

## 2021-09-14 NOTE — Telephone Encounter (Signed)
Lmtcb. CBC order placed.

## 2021-09-14 NOTE — Telephone Encounter (Signed)
Left message for pt to call back  °

## 2021-09-14 NOTE — Telephone Encounter (Signed)
Spoke with patient who had a question about lab work. He is going to have a BMET drawn today and wanted to know if a CBC was to be ordered as well. He was seen on 08/26/21.

## 2021-09-14 NOTE — Telephone Encounter (Signed)
Pt was in the office on 08/26/21 to see K. Lawrence and was advised to have lab work in 2 weeks. Pt received an order in the mail yesterday advising him to have blood work in 2 weeks as well. Pt wants to know if this is the same blood work and when does the 2 weeks start. Please advise pt further

## 2021-09-15 ENCOUNTER — Other Ambulatory Visit: Payer: Self-pay

## 2021-09-15 DIAGNOSIS — I739 Peripheral vascular disease, unspecified: Secondary | ICD-10-CM

## 2021-09-15 DIAGNOSIS — I25119 Atherosclerotic heart disease of native coronary artery with unspecified angina pectoris: Secondary | ICD-10-CM | POA: Diagnosis not present

## 2021-09-15 DIAGNOSIS — I480 Paroxysmal atrial fibrillation: Secondary | ICD-10-CM | POA: Diagnosis not present

## 2021-09-15 LAB — BASIC METABOLIC PANEL
BUN/Creatinine Ratio: 14 (ref 10–24)
BUN: 23 mg/dL (ref 8–27)
CO2: 22 mmol/L (ref 20–29)
Calcium: 9.5 mg/dL (ref 8.6–10.2)
Chloride: 102 mmol/L (ref 96–106)
Creatinine, Ser: 1.62 mg/dL — ABNORMAL HIGH (ref 0.76–1.27)
Glucose: 91 mg/dL (ref 70–99)
Potassium: 4.7 mmol/L (ref 3.5–5.2)
Sodium: 138 mmol/L (ref 134–144)
eGFR: 42 mL/min/{1.73_m2} — ABNORMAL LOW (ref >59)

## 2021-09-15 LAB — CBC
Hematocrit: 37.8 % (ref 37.5–51.0)
Hemoglobin: 13.5 g/dL (ref 13.0–17.7)
MCH: 32.3 pg (ref 26.6–33.0)
MCHC: 35.7 g/dL (ref 31.5–35.7)
MCV: 90 fL (ref 79–97)
Platelets: 211 10*3/uL (ref 150–450)
RBC: 4.18 x10E6/uL (ref 4.14–5.80)
RDW: 14.3 % (ref 11.6–15.4)
WBC: 13.3 10*3/uL — ABNORMAL HIGH (ref 3.4–10.8)

## 2021-09-16 ENCOUNTER — Telehealth: Payer: Self-pay

## 2021-09-16 ENCOUNTER — Other Ambulatory Visit: Payer: Self-pay

## 2021-09-16 ENCOUNTER — Encounter: Payer: Self-pay | Admitting: Cardiovascular Disease

## 2021-09-16 ENCOUNTER — Ambulatory Visit (INDEPENDENT_AMBULATORY_CARE_PROVIDER_SITE_OTHER): Payer: Medicare Other | Admitting: Cardiovascular Disease

## 2021-09-16 ENCOUNTER — Ambulatory Visit: Payer: Medicare Other | Admitting: Adult Health

## 2021-09-16 VITALS — BP 144/70 | HR 66 | Ht 67.0 in | Wt 158.0 lb

## 2021-09-16 DIAGNOSIS — E782 Mixed hyperlipidemia: Secondary | ICD-10-CM | POA: Diagnosis not present

## 2021-09-16 DIAGNOSIS — I25119 Atherosclerotic heart disease of native coronary artery with unspecified angina pectoris: Secondary | ICD-10-CM

## 2021-09-16 DIAGNOSIS — I150 Renovascular hypertension: Secondary | ICD-10-CM | POA: Diagnosis not present

## 2021-09-16 DIAGNOSIS — I480 Paroxysmal atrial fibrillation: Secondary | ICD-10-CM | POA: Diagnosis not present

## 2021-09-16 DIAGNOSIS — I739 Peripheral vascular disease, unspecified: Secondary | ICD-10-CM

## 2021-09-16 MED ORDER — APIXABAN 2.5 MG PO TABS: 2.5000 mg | ORAL_TABLET | Freq: Two times a day (BID) | ORAL | 0 refills | Status: DC

## 2021-09-16 MED ORDER — APIXABAN 2.5 MG PO TABS: 2.5000 mg | ORAL_TABLET | Freq: Two times a day (BID) | ORAL | 1 refills | Status: DC

## 2021-09-16 NOTE — Progress Notes (Signed)
09/16/2021 [name]   1941-08-31  [id number]  Primary Physician Tonia Ghent, MD Primary Cardiologist: [name] MD [name], [name]  HPI:  Ryan Pittman is a 81 y.o.  mildly overweight married Caucasian male father of 2, grandfather of 4 grandchildren, who is accompanied by his wife Ryan Pittman today.[name]  He is referred by Dr. Elsie Stain for cardiovascular evaluation because of chest pain.  I last saw him in the office 06/19/2019. His risk factors include 50 pack years of tobacco abuse having quit 8 years ago as well as treated hypertension and hyperlipidemia.  There is no family history.  He is never had a heart attack or stroke.  He does complain of some chest pain that began several months ago occurring on a monthly basis lasting minutes at a time.  He does have a history of PAD status post left femoropopliteal bypass grafting several years ago by Dr. Lucky Cowboy in [place].   He had a positive stress test and subsequent coronary CTA on 05/24/2018 revealing a high calcium score of 2858 with three-vessel calcification.  He ultimately underwent cardiac catheterization by myself 10/involving orbital atherectomy./2019 revealing three-vessel disease with preserved LV function.  He ultimately underwent CABG x5 by Dr. Cyndia Bent 07/11/2018 and has done well since.  He participated in cardiac rehab.     Since I saw him in the office 3 months ago he was begun on Repatha we will recheck his lipid liver profile.  He denies chest pain or shortness of breath.  He continues to complain of lifestyle limiting right calf claudication with lower extremity Dopplers performed 08/05/2018 revealing right ABI 0.78 and left 2.90.  A patent left femoropopliteal bypass graft and short segment occlusion distal right common femoral artery and high-grade disease in the proximal right SFA.     I performed peripheral angiography on him 06/26/2019 revealing a 90% left renal artery stenosis, patent left  femoropopliteal bypass graft, patent right common iliac artery stent, 90% calcified right common femoral artery stenosis, 80% in-stent restenosis within the previously placed right SFA stent and 90% calcified right popliteal artery stenosis.  He ultimately underwent right common femoral endarterectomy and patch angioplasty Dr. Trula Slade and had several subsequent procedures including most recently involving orbital atherectomy.    Since I saw him a year ago he was seen at [facility] for chest pain, A. fib with RVR.  He converted to sinus rhythm after IV metoprolol.  He has had no chest pain or shortness of breath since.  He also denies claudication.  There has been some discussion regarding the risk and benefits of beginning Eliquis oral anticoagulation in light of a prior GI bleed 20 years ago.  I believe the risk of stroke off anticoagulants outweighs the risk of bleeding on them.  Current Meds  Medication Sig   amLODipine (NORVASC) 10 MG tablet Take 1 tablet (10 mg total) by mouth daily.   clopidogrel (PLAVIX) 75 MG tablet TAKE 1 TABLET BY MOUTH DAILY   doxycycline (VIBRA-TABS) 100 MG tablet Take 1 tablet (100 mg total) by mouth 2 (two) times daily.   levothyroxine (SYNTHROID) 75 MCG tablet TAKE 1 TABLET BY MOUTH DAILY EXCEPT ON THURSDAYS TAKE 1.5 TABLETS   losartan (COZAAR) 100 MG tablet Take 1 tablet (100 mg total) by mouth daily.   metoprolol tartrate (LOPRESSOR) 25 MG tablet Take 0.5 tablets (12.5 mg total) by mouth 2 (two) times daily.   omeprazole (PRILOSEC) 20 MG capsule Take  1 capsule (20 mg total) by mouth daily.   pramipexole (MIRAPEX) 1 MG tablet Take 1 tablet (1 mg total) by mouth daily.   REPATHA SURECLICK 867 MG/ML SOAJ INJECT 140MG  EVERY 14 DAYS   tamsulosin (FLOMAX) 0.4 MG CAPS capsule Take 1 capsule (0.4 mg total) by mouth daily.   traZODone (DESYREL) 100 MG tablet Take 50-100 mg by mouth at bedtime as needed.     Allergies  Allergen Reactions   Penicillins  Itching and Other (See Comments)    PATIENT HAS HAD A PCN REACTION WITH IMMEDIATE RASH, FACIAL/TONGUE/THROAT SWELLING, SOB, OR LIGHTHEADEDNESS WITH HYPOTENSION:  #  #  YES  #  #  Has patient had a PCN reaction causing severe rash involving mucus membranes or skin necrosis: No Has patient had a PCN reaction that required hospitalization: No Has patient had a PCN reaction occurring within the last 10 years: No If all of the above answers are "NO", then may proceed with Cephalosporin use.    Ace Inhibitors Cough   Aspirin Other (See Comments)    H/o GI bleed   Celebrex [Celecoxib] Other (See Comments)    GI bleed   Lipitor [Atorvastatin] Other (See Comments)    myalgias    Social History   Socioeconomic History   Marital status: Married    Spouse name: Not on file   Number of children: Not on file   Years of education: Not on file   Highest education level: Not on file  Occupational History   Not on file  Tobacco Use   Smoking status: Former    Types: Cigarettes    Quit date: 09/04/2008    Years since quitting: 13.0   Smokeless tobacco: Former    Types: Chew   Tobacco comments:    HAs quit tobacco products 2009  Vaping Use   Vaping Use: Never used  Substance and Sexual Activity   Alcohol use: Not Currently    Alcohol/week: 0.0 standard drinks   Drug use: No   Sexual activity: Yes  Other Topics Concern   Not on file  Social History Narrative   Married 50+ years, 2 kids   [place] to play golf   Social Determinants of Health   Financial Resource Strain: Low Risk    Difficulty of Paying Living Expenses: Not hard at all  Food Insecurity: No Food Insecurity   Worried About Charity fundraiser in the Last Year: Never true   Arboriculturist in the Last Year: Never true  Transportation Needs: No Transportation Needs   Lack of Transportation (Medical): No   Lack of Transportation (Non-Medical): No  Physical Activity: Inactive   Days of Exercise per  Week: 0 days   Minutes of Exercise per Session: 0 min  Stress: No Stress Concern Present   Feeling of Stress : Not at all  Social Connections: Not on file  Intimate Partner Violence: Not At Risk   Fear of Current or Ex-Partner: No   Emotionally Abused: No   Physically Abused: No   Sexually Abused: No     Review of Systems: General: negative for chills, fever, night sweats or weight changes.  Cardiovascular: negative for chest pain, dyspnea on exertion, edema, orthopnea, palpitations, paroxysmal nocturnal dyspnea or shortness of breath Dermatological: negative for rash Respiratory: negative for cough or wheezing Urologic: negative for hematuria Abdominal: negative for nausea, vomiting, diarrhea, bright red blood per rectum, melena, or hematemesis Neurologic: negative for visual changes,  syncope, or dizziness All other systems reviewed and are otherwise negative except as noted above.    Blood pressure (!) 144/70, pulse 66, height 5\' 7"  (1.702 m), weight 158 lb (71.7 kg), SpO2 97 %.  General appearance: alert and no distress Neck: no adenopathy, no carotid bruit, no JVD, supple, symmetrical, trachea midline, and thyroid not enlarged, symmetric, no tenderness/mass/nodules Lungs: clear to auscultation bilaterally Heart: Soft outflow tract murmur Extremities: extremities normal, atraumatic, no cyanosis or edema Pulses: 2+ and symmetric Skin: Skin color, texture, turgor normal. No rashes or lesions Neurologic: Grossly normal  EKG sinus rhythm at 66 without ST or T wave changes.  Personally reviewed this EKG.  ASSESSMENT AND PLAN:   PVD (peripheral vascular disease) ([place]) Peripheral arterial disease status post angiography by myself 06/24/2019 revealing a 90% left renal artery stenosis, patent left femoropopliteal bypass graft, patent right common iliac artery stent and 90% calcified right common femoral artery, 80% in-stent restenosis within the previously placed right SFA stent and  90% calcified right popliteal artery stenosis.  He ultimately underwent right common femoral endarterectomy with patch angioplasty by Dr. Trula Slade and has had several subsequent procedures after that.  He currently denies claudication.  His Doppler studies are currently followed by Dr. Stephens Shire office.  Hyperlipidemia History of hyperlipidemia on Repatha with lipid profile performed 08/05/2021 revealing a total cholesterol of 145, LDL 53 and HDL 78.  Renovascular hypertension History of renal artery stenosis demonstrated on angiography 08/26/2019.  His last renal Doppler study performed 06/26/2019 revealed a left renal aortic ratio 4.5 with a left renal dimension of 10.3 cm.  His blood pressures have been a little bit more difficult to control.  We will recheck renal Doppler studies.  Hx of CABG History of CAD status post coronary artery bypass grafting x5 by Dr. Cyndia Bent 07/11/2018.  He had a LIMA to his LAD, vein to OM1, OM 2 and 3 and to the RCA.  He had an episode of chest pain back in December and was seen at [facility].  That time he was in A. fib and converted to sinus rhythm on IV metoprolol and his pain resolved.  I am going to get a Lexiscan to rule out ischemic heart disease.  PAF (paroxysmal atrial fibrillation) (HCC) History of PAF which led to an ER visit at [facility].  He converted with IV metoprolol. The CHA2DSVASC2 score is 4  .  We have been having debate over whether to anticoagulate given a prior history of GI bleeding.  I think the consensus was that the risk of stroke and A. fib outweighed the risk of bleeding and therefore decision has been to start him on low-dose Eliquis based on his age and renal function, discontinue his Plavix and start a baby aspirin.  He is currently in sinus rhythm.     [name] MD FACP,FACC,FAHA, [facility] 09/16/2021 12:35 PM

## 2021-09-16 NOTE — Assessment & Plan Note (Signed)
History of renal artery stenosis demonstrated on angiography 08/26/2019.  His last renal Doppler study performed 06/26/2019 revealed a left renal aortic ratio 4.5 with a left renal dimension of 10.3 cm.  His blood pressures have been a little bit more difficult to control.  We will recheck renal Doppler studies.

## 2021-09-16 NOTE — Assessment & Plan Note (Signed)
Peripheral arterial disease status post angiography by myself 06/24/2019 revealing a 90% left renal artery stenosis, patent left femoropopliteal bypass graft, patent right common iliac artery stent and 90% calcified right common femoral artery, 80% in-stent restenosis within the previously placed right SFA stent and 90% calcified right popliteal artery stenosis.  He ultimately underwent right common femoral endarterectomy with patch angioplasty by Dr. Trula Slade and has had several subsequent procedures after that.  He currently denies claudication.  His Doppler studies are currently followed by Dr. Stephens Shire office.

## 2021-09-16 NOTE — Patient Instructions (Signed)
Medication Instructions:   -Stop taking clopidogrel (Plavix).  -Start taking apixaban (Eliquis) 2.5mg  twice daily.  -Start taking aspirin 81mg .   *If you need a refill on your cardiac medications before your next appointment, please call your pharmacy*   Testing/Procedures: Your physician has requested that you have a lexiscan myoview. For further information please visit [email or web address]. Please follow instruction sheet, as given. This will take place at [address], [address]  How to prepare for your Myocardial Perfusion Test: Do not eat or drink 3 hours prior to your test, except you may have water. Do not consume products containing caffeine (regular or decaffeinated) 12 hours prior to your test. (ex: coffee, chocolate, sodas, tea). Do bring a list of your current medications with you.  If not listed below, you may take your medications as normal. Do wear comfortable clothes (no dresses or overalls) and walking shoes, tennis shoes preferred (No heels or open toe shoes are allowed). Do NOT wear cologne, perfume, aftershave, or lotions (deodorant is allowed). The test will take approximately 3 to 4 hours to complete If these instructions are not followed, your test will have to be rescheduled.   Your physician has requested that you have a renal artery duplex. During this test, an ultrasound is used to evaluate blood flow to the kidneys. Allow one hour for this exam. Do not eat after midnight the day before and avoid carbonated beverages. Take your medications as you usually do. This procedure is done at [place].   Follow-Up: At [facility], you and your health needs are our priority.  As part of our continuing mission to provide you with exceptional heart care, we have created designated Provider Care Teams.  These Care Teams include your primary Cardiologist (physician) and Advanced Practice Providers (APPs -  Physician Assistants and Nurse Practitioners) who  all work together to provide you with the care you need, when you need it.  We recommend signing up for the patient portal called "MyChart".  Sign up information is provided on this After Visit Summary.  MyChart is used to connect with patients for Virtual Visits (Telemedicine).  Patients are able to view lab/test results, encounter notes, upcoming appointments, etc.  Non-urgent messages can be sent to your provider as well.   To learn more about what you can do with MyChart, go to [email or web address].    Your next appointment:   3 month(s)  The format for your next appointment:   In Person  Provider:   Jory Sims, DNP, ANP       Then, [name], MD will plan to see you again in 6 month(s).

## 2021-09-16 NOTE — Assessment & Plan Note (Signed)
History of PAF which led to an ER visit at [facility].  He converted with IV metoprolol. The CHA2DSVASC2 score is 4  .  We have been having debate over whether to anticoagulate given a prior history of GI bleeding.  I think the consensus was that the risk of stroke and A. fib outweighed the risk of bleeding and therefore decision has been to start him on low-dose Eliquis based on his age and renal function, discontinue his Plavix and start a baby aspirin.  He is currently in sinus rhythm.

## 2021-09-16 NOTE — Telephone Encounter (Addendum)
Patiens wife returned cal regarding results.----- Message from Lendon Colonel, NP sent at 09/16/2021  7:40 AM EST ----- Elevation in WBC are noted just as before. But no evidence of anemia. Will discuss at office it.

## 2021-09-16 NOTE — Telephone Encounter (Addendum)
Called patient regarding results. Left message----- Message from Lendon Colonel, NP sent at 09/16/2021  7:36 AM EST ----- Labs reviewed. Slight rise in creatinine and decrease in GFR.  Will continue to monitor being followed by nephrology.

## 2021-09-16 NOTE — Progress Notes (Deleted)
Cardiology Clinic Note   Patient Name: Ryan Pittman Date of Encounter: 09/16/2021  Primary Care Provider:  Tonia Ghent, MD Primary Cardiologist:  Ryan Burow, MD  Patient Profile    81 year old patient with history of coronary artery disease, coronary CTA on 05/24/2018 showed calcium score of 2958 with three-vessel calcification.  Cardiac catheterization by Dr. Gwenlyn Pittman on 10/09/2017 revealed three-vessel disease with preserved LVEF and required CABG x5 by Dr. Caffie Pittman on 09/10/2017.    Patient also has a history of PAD with renal artery disease and renal artery stenosis status post peripheral angiography on 06/26/2019 which showed 90% left renal artery stenosis, chronic kidney disease, and patent left pop femoral-popliteal bypass grafting right common iliac artery stent and 90% calcified right popliteal stenosis.  The patient is status post right common femoral endarterectomy and patch angioplasty by Ryan Pittman with several subsequent procedures including orbital atherectomy.  Patient also has history of atrial fibrillation with RVR. CHADS VAC Score of 5 with annual stroke risk of 1.7%.  He was treated with IV metoprolol followed by p.o. metoprolol at 25 mg twice daily.  He has frequent episodes of heartburn and nervousness his which time he does feel his heart racing at times.  After multiple conversations with the patient, consultation with Drs. Ryan Pittman, EP physician, and with Dr. Alvester Pittman, it was decided that we would discontinue Plavix and begin Eliquis 2.5 mg twice daily (anticoagulation score and dose calculation with Cr Cl 36.8).  We were placing him on a cardiac monitor to evaluate frequency and morphology of atrial fibrillation.  The patient is reluctant to use Eliquis because he has a history of GI bleed.  This has been discussed with both cardiologist who agrees that Plavix discontinuation and use of Eliquis would be safe at this time.  He was to continue omeprazole for indigestion as  needed.  Past Medical History    Past Medical History:  Diagnosis Date   Anemia    due to GIB, s/p transfusion   Anginal pain ([place]) 07/10/2018   Arthritis    back pain, much worse after consecutive golf rounds   Cancer [facility])    thyroid   Chronic kidney disease    Colon polyps    Coronary artery disease    COVID-19    Dyspnea    walking up a hill   Dysrhythmia 2020   GERD (gastroesophageal reflux disease)    Hypercalcemia    h/o, resolved as of 2012, prev due to high amount of calcium intake   Hyperlipidemia    Hypertension    Hypothyroidism    IgG gammopathy    stable as of 2012 per [facility]   Pneumonia    as a child   PVD (peripheral vascular disease) ([place])    L leg bypass, R leg stented   Past Surgical History:  Procedure Laterality Date    dental implant left bottom-did have bleeding      ABDOMINAL AORTOGRAM W/LOWER EXTREMITY Bilateral 06/26/2019   Procedure: ABDOMINAL AORTOGRAM W/LOWER EXTREMITY;  Surgeon: Ryan Harp, MD;  Location: [place] [facility];  Service: Cardiovascular;  Laterality: Bilateral;   ABDOMINAL AORTOGRAM W/LOWER EXTREMITY N/A 06/08/2020   Procedure: ABDOMINAL AORTOGRAM W/LOWER EXTREMITY;  Surgeon: Ryan Mitchell, MD;  Location: [place] [facility];  Service: Cardiovascular;  Laterality: N/A;   BYPASS GRAFT     L leg   CARDIAC CATHETERIZATION  06/24/2018   COLONOSCOPY WITH PROPOFOL N/A 05/04/2015   Procedure: COLONOSCOPY WITH PROPOFOL;  Surgeon: Ryan Done  Kassie Mends, MD;  Location: [facility];  Service: Endoscopy;  Laterality: N/A;   CORONARY ARTERY BYPASS GRAFT N/A 07/11/2018   Procedure: CORONARY ARTERY BYPASS GRAFTING (CABG) x three, using left internal mammary artery and right leg greater saphenous vein harvested endoscopically;  Surgeon: Ryan Pollack, MD;  Location: [place] OR;  Service: Open Heart Surgery;  Laterality: N/A;   ENDARTERECTOMY FEMORAL Right 02/13/2020   Procedure: RIGHT ENDARTERECTOMY FEMORAL WITH BOVINE PATCH;  Surgeon:  Ryan Mitchell, MD;  Location: [place];  Service: Vascular;  Laterality: Right;   LEFT HEART CATH AND CORONARY ANGIOGRAPHY N/A 06/24/2018   Procedure: LEFT HEART CATH AND CORONARY ANGIOGRAPHY;  Surgeon: Ryan Harp, MD;  Location: [place] [facility];  Service: Cardiovascular;  Laterality: N/A;   PERIPHERAL VASCULAR ATHERECTOMY Right 06/08/2020   Procedure: PERIPHERAL VASCULAR ATHERECTOMY;  Surgeon: Ryan Mitchell, MD;  Location: [place] [facility];  Service: Cardiovascular;  Laterality: Right;  superficial femoral   PERIPHERAL VASCULAR BALLOON ANGIOPLASTY Right 06/08/2020   Procedure: PERIPHERAL VASCULAR BALLOON ANGIOPLASTY;  Surgeon: Ryan Mitchell, MD;  Location: [place] [facility];  Service: Cardiovascular;  Laterality: Right;  External iliac   TEE WITHOUT CARDIOVERSION N/A 07/11/2018   Procedure: TRANSESOPHAGEAL ECHOCARDIOGRAM (TEE);  Surgeon: Ryan Pollack, MD;  Location: [place];  Service: Open Heart Surgery;  Laterality: N/A;   THYROIDECTOMY, PARTIAL  2016   VASCULAR SURGERY      Allergies  Allergies  Allergen Reactions   Penicillins Itching and Other (See Comments)    PATIENT HAS HAD A PCN REACTION WITH IMMEDIATE RASH, FACIAL/TONGUE/THROAT SWELLING, SOB, OR LIGHTHEADEDNESS WITH HYPOTENSION:  #  #  YES  #  #  Has patient had a PCN reaction causing severe rash involving mucus membranes or skin necrosis: No Has patient had a PCN reaction that required hospitalization: No Has patient had a PCN reaction occurring within the last 10 years: No If all of the above answers are "NO", then may proceed with Cephalosporin use.    Ace Inhibitors Cough   Aspirin Other (See Comments)    H/o GI bleed   Celebrex [Celecoxib] Other (See Comments)    GI bleed   Lipitor [Atorvastatin] Other (See Comments)    myalgias    History of Present Illness    Ryan Pittman Pittman today for ongoing assessment and management of paroxysmal atrial fibrillation (CHADS VASC Score of 5), CAD, status post  CABG, PAD status post bilateral femoropopliteal bypass grafting by Ryan Pittman.  He was to have an echocardiogram which has not yet been completed.  Most recent creatinine 1.42 with a GFR of 42. Creatinine Clearance 36.8 with recommended dose of Eliquis at 2.5 mg BID. He had a cardiac monitor placed for evaluation of recurrence atrial fibrillation and frequency.   Home Medications    Current Outpatient Medications  Medication Sig Dispense Refill   amLODipine (NORVASC) 10 MG tablet Take 1 tablet (10 mg total) by mouth daily.     clopidogrel (PLAVIX) 75 MG tablet TAKE 1 TABLET BY MOUTH DAILY 90 tablet 0   doxycycline (VIBRA-TABS) 100 MG tablet Take 1 tablet (100 mg total) by mouth 2 (two) times daily. 14 tablet 0   levothyroxine (SYNTHROID) 75 MCG tablet TAKE 1 TABLET BY MOUTH DAILY EXCEPT ON THURSDAYS TAKE 1.5 TABLETS 100 tablet 3   losartan (COZAAR) 100 MG tablet Take 1 tablet (100 mg total) by mouth daily.     metoprolol tartrate (LOPRESSOR) 25 MG tablet Take 0.5 tablets (12.5  mg total) by mouth 2 (two) times daily. 60 tablet 3   omeprazole (PRILOSEC) 20 MG capsule Take 1 capsule (20 mg total) by mouth daily. 30 capsule 6   pramipexole (MIRAPEX) 1 MG tablet Take 1 tablet (1 mg total) by mouth daily.     REPATHA SURECLICK 093 MG/ML SOAJ INJECT 140MG  EVERY 14 DAYS 2 mL 11   sulfamethoxazole-trimethoprim (BACTRIM) 400-80 MG tablet Take 1 tablet by mouth 3 (three) times a week.     tamsulosin (FLOMAX) 0.4 MG CAPS capsule Take 1 capsule (0.4 mg total) by mouth daily. 90 capsule 3   traZODone (DESYREL) 100 MG tablet Take 50-100 mg by mouth at bedtime as needed.     No current facility-administered medications for this visit.     Family History    Family History  Problem Relation Age of Onset   Diabetes Mother    Stroke Father    Colon cancer Neg Hx    Prostate cancer Neg Hx    He indicated that his mother is deceased. He indicated that his father is deceased. He indicated that his sister is  alive. He indicated that only one of his two brothers is alive. He indicated that the status of his neg hx is unknown.  Social History    Social History   Socioeconomic History   Marital status: Married    Spouse name: Not on file   Number of children: Not on file   Years of education: Not on file   Highest education level: Not on file  Occupational History   Not on file  Tobacco Use   Smoking status: Former    Types: Cigarettes    Quit date: 09/04/2008    Years since quitting: 13.0   Smokeless tobacco: Former    Types: Chew   Tobacco comments:    HAs quit tobacco products 2009  Vaping Use   Vaping Use: Never used  Substance and Sexual Activity   Alcohol use: Not Currently    Alcohol/week: 0.0 standard drinks   Drug use: No   Sexual activity: Yes  Other Topics Concern   Not on file  Social History Narrative   Married 50+ years, 2 kids   [place] to play golf   Social Determinants of Health   Financial Resource Strain: Low Risk    Difficulty of Paying Living Expenses: Not hard at all  Food Insecurity: No Food Insecurity   Worried About Charity fundraiser in the Last Year: Never true   Arboriculturist in the Last Year: Never true  Transportation Needs: No Transportation Needs   Lack of Transportation (Medical): No   Lack of Transportation (Non-Medical): No  Physical Activity: Inactive   Days of Exercise per Week: 0 days   Minutes of Exercise per Session: 0 min  Stress: No Stress Concern Present   Feeling of Stress : Not at all  Social Connections: Not on file  Intimate Partner Violence: Not At Risk   Fear of Current or Ex-Partner: No   Emotionally Abused: No   Physically Abused: No   Sexually Abused: No     Review of Systems    General:  No chills, fever, night sweats or weight changes.  Cardiovascular:  No chest pain, dyspnea on exertion, edema, orthopnea, palpitations, paroxysmal nocturnal dyspnea. Dermatological: No rash,  lesions/masses Respiratory: No cough, dyspnea Urologic: No hematuria, dysuria Abdominal:   No nausea, vomiting, diarrhea, bright red blood per rectum, melena,  or hematemesis Neurologic:  No visual changes, wkns, changes in mental status. All other systems reviewed and are otherwise negative except as noted above.     Physical Exam    VS:  There were no vitals taken for this visit. , BMI There is no height or weight on file to calculate BMI.     GEN: Well nourished, well developed, in no acute distress. HEENT: normal. Neck: Supple, no JVD, carotid bruits, or masses. Cardiac: RRR, no murmurs, rubs, or gallops. No clubbing, cyanosis, edema.  Radials/DP/PT 2+ and equal bilaterally.  Respiratory:  Respirations regular and unlabored, clear to auscultation bilaterally. GI: Soft, nontender, nondistended, BS + x 4. MS: no deformity or atrophy. Skin: warm and dry, no rash. Neuro:  Strength and sensation are intact. Psych: Normal affect.  Accessory Clinical Findings    ECG personally reviewed by me today- *** - No acute changes  Lab Results  Component Value Date   WBC 13.3 (H) 09/15/2021   HGB 13.5 09/15/2021   HCT 37.8 09/15/2021   MCV 90 09/15/2021   PLT 211 09/15/2021   Lab Results  Component Value Date   CREATININE 1.62 (H) 09/14/2021   BUN 23 09/14/2021   NA 138 09/14/2021   K 4.7 09/14/2021   CL 102 09/14/2021   CO2 22 09/14/2021   Lab Results  Component Value Date   ALT 15 08/05/2021   AST 18 08/05/2021   ALKPHOS 49 08/05/2021   BILITOT 0.3 08/05/2021   Lab Results  Component Value Date   CHOL 145 08/05/2021   HDL 78 08/05/2021   LDLCALC 53 08/05/2021   LDLDIRECT 41.0 12/30/2020   TRIG 72 08/05/2021   CHOLHDL 1.9 08/05/2021    Lab Results  Component Value Date   HGBA1C 6.1 (H) 07/14/2019    Review of Prior Studies:   Assessment & Plan   1.  ***    Current medicines are reviewed at length with the patient today.  I have spent *** min's  dedicated  to the care of this patient on the date of this encounter to include pre-visit review of records, assessment, management and diagnostic testing,with shared decision making. Signed, Phill Myron. West Pugh, ANP, AACC   09/16/2021 7:23 AM    [place] [address] [address] Office [phone] Fax [phone]  Notice: This dictation was prepared with Dragon dictation along with smaller phrase technology. Any transcriptional errors that result from this process are unintentional and may not be corrected upon review.

## 2021-09-16 NOTE — Telephone Encounter (Signed)
Wife of patient returning call about lab results. Call transferred to [facility]

## 2021-09-16 NOTE — Assessment & Plan Note (Signed)
History of hyperlipidemia on Repatha with lipid profile performed 08/05/2021 revealing a total cholesterol of 145, LDL 53 and HDL 78.

## 2021-09-16 NOTE — Assessment & Plan Note (Signed)
History of CAD status post coronary artery bypass grafting x5 by Dr. Cyndia Bent 07/11/2018.  He had a LIMA to his LAD, vein to OM1, OM 2 and 3 and to the RCA.  He had an episode of chest pain back in December and was seen at [facility].  That time he was in A. fib and converted to sinus rhythm on IV metoprolol and his pain resolved.  I am going to get a Lexiscan to rule out ischemic heart disease.

## 2021-09-16 NOTE — Telephone Encounter (Signed)
Spoke with pt regarding change in appointment. Pt able to make office visit with Dr. Gwenlyn Found today at 11am.

## 2021-09-16 NOTE — Telephone Encounter (Addendum)
Patient wife returned call regarding reuslts.----- Message from Lendon Colonel, NP sent at 09/16/2021  7:36 AM EST ----- Labs reviewed. Slight rise in creatinine and decrease in GFR.  Will continue to monitor being followed by nephrology.

## 2021-09-16 NOTE — Telephone Encounter (Addendum)
Called patient regarding results. Left message.----- Message from Lendon Colonel, NP sent at 09/16/2021  7:40 AM EST ----- Elevation in WBC are noted just as before. But no evidence of anemia. Will discuss at office it.

## 2021-09-19 ENCOUNTER — Telehealth: Payer: Self-pay | Admitting: *Deleted

## 2021-09-19 ENCOUNTER — Other Ambulatory Visit: Payer: Self-pay

## 2021-09-19 ENCOUNTER — Ambulatory Visit (HOSPITAL_COMMUNITY): Payer: Medicare Other | Attending: Cardiology

## 2021-09-19 DIAGNOSIS — I059 Rheumatic mitral valve disease, unspecified: Secondary | ICD-10-CM | POA: Insufficient documentation

## 2021-09-19 DIAGNOSIS — I359 Nonrheumatic aortic valve disorder, unspecified: Secondary | ICD-10-CM | POA: Insufficient documentation

## 2021-09-19 DIAGNOSIS — N179 Acute kidney failure, unspecified: Secondary | ICD-10-CM | POA: Diagnosis not present

## 2021-09-19 DIAGNOSIS — I10 Essential (primary) hypertension: Secondary | ICD-10-CM | POA: Diagnosis not present

## 2021-09-19 DIAGNOSIS — D631 Anemia in chronic kidney disease: Secondary | ICD-10-CM | POA: Diagnosis not present

## 2021-09-19 DIAGNOSIS — N182 Chronic kidney disease, stage 2 (mild): Secondary | ICD-10-CM | POA: Diagnosis not present

## 2021-09-19 LAB — ECHOCARDIOGRAM COMPLETE
AR max vel: 2.23 cm2
AV Area VTI: 2.39 cm2
AV Area mean vel: 2.13 cm2
AV Mean grad: 9 mmHg
AV Peak grad: 16.1 mmHg
Ao pk vel: 2.01 m/s
Area-P 1/2: 2.84 cm2
S' Lateral: 1.8 cm

## 2021-09-19 NOTE — Telephone Encounter (Signed)
Anderson Malta, from [facility])  called stating Dr Zollie Scale is requesting that Dr Trula Slade see patient with worsening left renal artery stenosis.  Blood pressure is increasing and renal functions have decreased significantly.  Patient is scheduled 09/23/2021 a renal study at [facility] and Dr Trula Slade will see patient following study.

## 2021-09-21 ENCOUNTER — Telehealth: Payer: Self-pay

## 2021-09-21 DIAGNOSIS — I480 Paroxysmal atrial fibrillation: Secondary | ICD-10-CM | POA: Diagnosis not present

## 2021-09-21 DIAGNOSIS — I10 Essential (primary) hypertension: Secondary | ICD-10-CM | POA: Diagnosis not present

## 2021-09-21 DIAGNOSIS — I359 Nonrheumatic aortic valve disorder, unspecified: Secondary | ICD-10-CM | POA: Diagnosis not present

## 2021-09-21 DIAGNOSIS — I059 Rheumatic mitral valve disease, unspecified: Secondary | ICD-10-CM | POA: Diagnosis not present

## 2021-09-21 NOTE — Telephone Encounter (Addendum)
Called patient regarding results. Left message for patient to call office.----- Message from Lendon Colonel, NP sent at 09/20/2021  5:31 PM EST ----- I have reviewed his echocardiogram. His heart pumping function is normal. His left ventricle is slightly stiff. The aortic valve shows some mild stiffening when it opens and closes, but not of concern. His mitral valve (the valve between the left top chamber of his heart chamber and bottom chamber) of his heart is normal. Good report.   KL

## 2021-09-22 ENCOUNTER — Telehealth: Payer: Self-pay

## 2021-09-22 NOTE — Chronic Care Management (AMB) (Signed)
Chronic Care Management Pharmacy Assistant   Name: DEMONTAE ANTUNES  MRN: [id number] DOB: Jun 03, 1941  Ryan Pittman is an 81 y.o. year old male who presents for his initial CCM visit with the clinical pharmacist.  Reason for Encounter: Initial Questions   Conditions to be addressed/monitored: CAD, HTN, HLD, and CKD Stage 3   Recent office visits:  09/09/21-PCP-Graham Duncan,MD-Patient presented for follow up hypertension.I would continue 12.5mg  (1/2 tab) of metoprolol twice a day.  If you have heart racing above 100, you can take an extra 1/2 tab of metoprolol and call cardiology for help.  Hold septra while taking doxycyline.  Start doxycycline and let me know if the cough doesn't get better  Recent consult visits:  09/19/21-Nephrology-Munsoor Lateef,MD-Patient presented for follow CKD.Labs ordered(abnormal)no medication changes  09/16/21-Cardiology-Jonathan Berry,MD-Patient presented for cardiovascular evaluation due to chest pain. Discussed EKG,doppler study, labs,referral for lexiscan,stop plavix, start Eliquis 2.5mg  and low dose aspirin .Follow up 6 months  09/05/21-Nephrology-Munsoor Lateef,MD- Patient presented for follow up CKD.Increase amlodipine to 10mg  1 tablet daily, labs ordered. 08/26/21-Cardiology-Kathryn Lawrence,NP-Patient presented for follow up heart disease.EKG, take home BP readings and bring to next appt. Instructed to take omeprazole daily, apply zio patch monitor  08/25/21-[facility] ED-Zachary Smith,MD-Patient presented for left side chest pain.Labs, EKG,chest xray,ECG, IV metoprolol,  Increase metoprolol to 25mg  twice daily for the next couple days, follow up with cardiology, start benzonatate 100mg  three times daily for cough.No admission 08/18/21-Vascular Surgery- Madelon Lips presented for follow up PAD. Doppler studies,no medication changes.  Hospital visits:  None in previous 6 months  Medications: Outpatient Encounter Medications as of 09/22/2021   Medication Sig   amLODipine (NORVASC) 10 MG tablet Take 1 tablet (10 mg total) by mouth daily.   apixaban (ELIQUIS) 2.5 MG TABS tablet Take 1 tablet (2.5 mg total) by mouth 2 (two) times daily.   apixaban (ELIQUIS) 2.5 MG TABS tablet Take 1 tablet (2.5 mg total) by mouth 2 (two) times daily.   aspirin EC 81 MG tablet Take 81 mg by mouth daily. Swallow whole.   doxycycline (VIBRA-TABS) 100 MG tablet Take 1 tablet (100 mg total) by mouth 2 (two) times daily.   levothyroxine (SYNTHROID) 75 MCG tablet TAKE 1 TABLET BY MOUTH DAILY EXCEPT ON THURSDAYS TAKE 1.5 TABLETS   losartan (COZAAR) 100 MG tablet Take 1 tablet (100 mg total) by mouth daily.   metoprolol tartrate (LOPRESSOR) 25 MG tablet Take 0.5 tablets (12.5 mg total) by mouth 2 (two) times daily.   omeprazole (PRILOSEC) 20 MG capsule Take 1 capsule (20 mg total) by mouth daily.   pramipexole (MIRAPEX) 1 MG tablet Take 1 tablet (1 mg total) by mouth daily.   REPATHA SURECLICK 027 MG/ML SOAJ INJECT 140MG  EVERY 14 DAYS   sulfamethoxazole-trimethoprim (BACTRIM) 400-80 MG tablet Take 1 tablet by mouth 3 (three) times a week.   tamsulosin (FLOMAX) 0.4 MG CAPS capsule Take 1 capsule (0.4 mg total) by mouth daily.   traZODone (DESYREL) 100 MG tablet Take 50-100 mg by mouth at bedtime as needed.   No facility-administered encounter medications on file as of 09/22/2021.    Lab Results  Component Value Date/Time   HGBA1C 6.1 (H) 07/14/2019 08:30 AM   HGBA1C 5.7 (H) 07/10/2018 11:41 AM     BP Readings from Last 3 Encounters:  09/16/21 (!) 144/70  09/09/21 (!) 160/62  08/26/21 (!) 182/60    Patient contacted to review initial questions prior to visit with [name].  Have you seen any other  providers since your last visit with PCP? Yes Nephrology, Cardiology   Any changes in your medications or health? No  Any side effects from any medications? No  Do you have an symptoms or problems not managed by your medications? No  Any  concerns about your health right now? No  Has your provider asked that you check blood pressure, blood sugar, or follow special diet at home?  Patient has BP monitor  Do you get any type of exercise on a regular basis?  No formal exercise. The patient enjoys playing golf  Can you think of a goal you would like to reach for your health? No  Do you have any problems getting your medications? No  [facility] [place]  Is there anything that you would like to discuss during the appointment? No   Spoke with patient and reminded them to have all medications, supplements and any blood glucose and blood pressure readings available for review with pharmacist, at their telephone visit on 09/28/21 at 11:00am.   Star Rating Drugs:  Medication:  Last Fill: Day Supply Losartan 100mg  09/06/21  30   Care Gaps: Annual wellness visit in last year? Yes Most Recent BP reading:144/70 66-P 09/16/21   Marjo Bicker CPP notified  [name], [place] Assistant [phone]  Total time spent for month CPA: 40 min

## 2021-09-23 ENCOUNTER — Ambulatory Visit (HOSPITAL_COMMUNITY)
Admission: RE | Admit: 2021-09-23 | Discharge: 2021-09-23 | Disposition: A | Discharge status: Home / Self Care | Payer: Medicare Other | Source: Ambulatory Visit | Attending: Cardiology | Admitting: Cardiology

## 2021-09-23 ENCOUNTER — Other Ambulatory Visit: Payer: Self-pay

## 2021-09-23 ENCOUNTER — Ambulatory Visit (INDEPENDENT_AMBULATORY_CARE_PROVIDER_SITE_OTHER): Payer: Medicare Other | Admitting: Surgery

## 2021-09-23 ENCOUNTER — Encounter: Payer: Self-pay | Admitting: Surgery

## 2021-09-23 VITALS — BP 153/69 | HR 70 | Temp 98.0°F | Resp 16 | Ht 67.0 in | Wt 157.0 lb

## 2021-09-23 DIAGNOSIS — I25119 Atherosclerotic heart disease of native coronary artery with unspecified angina pectoris: Secondary | ICD-10-CM | POA: Diagnosis not present

## 2021-09-23 DIAGNOSIS — I150 Renovascular hypertension: Secondary | ICD-10-CM | POA: Diagnosis not present

## 2021-09-23 DIAGNOSIS — I701 Atherosclerosis of renal artery: Secondary | ICD-10-CM

## 2021-09-23 DIAGNOSIS — I70213 Atherosclerosis of native arteries of extremities with intermittent claudication, bilateral legs: Secondary | ICD-10-CM

## 2021-09-23 NOTE — Progress Notes (Signed)
[facility]  Patient name: Ryan Pittman MRN: [id number] DOB: 07/24/1941 Sex: male   REASON FOR VISIT:    Follow-up  HISOTRY OF PRESENT ILLNESS:    Ryan Pittman is a 81 y.o. male who returns today for follow-up.  He has a history of a left femoral to below-knee popliteal bypass graft by Dr. Lucky Cowboy in [place].  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  On 02/13/2020 he underwent right iliofemoral endarterectomy with bovine patch angioplasty.  A long 8 cm patch was not required.  And October 2021 he underwent angiography for recurrent claudication symptoms and had drug-coated balloon angioplasty of the right external iliac and common femoral arteries as well as atherectomy of the right superficial femoral artery.  He continues to complain of some right leg claudication symptoms.  In addition he has had blood pressure issues recently.  He has a known renal artery stenosis.  Patient has a history of hypertension which is medically managed.  He is on a statin for hypercholesterolemia.  He is status post CABG for coronary artery disease in 2019.  He was recently started on Eliquis for atrial fibrillation.  He does have a history of GI bleed PAST MEDICAL HISTORY:   Past Medical History:  Diagnosis Date   Anemia    due to GIB, s/p transfusion   Anginal pain ([place]) 07/10/2018   Arthritis    back pain, much worse after consecutive golf rounds   Cancer [facility])    thyroid   Chronic kidney disease    Colon polyps    Coronary artery disease    COVID-19    Dyspnea    walking up a hill   Dysrhythmia 2020   GERD (gastroesophageal reflux disease)    Hypercalcemia    h/o, resolved as of 2012, prev due to high amount of calcium intake   Hyperlipidemia    Hypertension    Hypothyroidism    IgG gammopathy    stable as of  2012 per [facility]   Pneumonia    as a child   PVD (peripheral vascular disease) ([place])    L leg bypass, R leg stented     FAMILY HISTORY:   Family History  Problem Relation Age of Onset   Diabetes Mother    Stroke Father    Colon cancer Neg Hx    Prostate cancer Neg Hx     SOCIAL HISTORY:   Social History   Tobacco Use   Smoking status: Former    Types: Cigarettes    Quit date: 09/04/2008    Years since quitting: 13.0   Smokeless tobacco: Former    Types: Chew   Tobacco comments:    HAs quit tobacco products 2009  Substance Use Topics   Alcohol use: Not Currently    Alcohol/week: 0.0 standard drinks     ALLERGIES:   Allergies  Allergen Reactions   Penicillins Itching and Other (See Comments)    PATIENT HAS HAD A PCN REACTION WITH IMMEDIATE RASH, FACIAL/TONGUE/THROAT SWELLING, SOB, OR LIGHTHEADEDNESS WITH HYPOTENSION:  #  #  YES  #  #  Has patient had a PCN reaction causing severe rash involving mucus membranes or skin necrosis: No Has patient had a PCN reaction that required hospitalization: No Has patient had a PCN reaction occurring within the last 10 years: No If all of the above answers are "NO", then may  proceed with Cephalosporin use.    Ace Inhibitors Cough   Aspirin Other (See Comments)    H/o GI bleed   Celebrex [Celecoxib] Other (See Comments)    GI bleed   Lipitor [Atorvastatin] Other (See Comments)    myalgias     CURRENT MEDICATIONS:   Current Outpatient Medications  Medication Sig Dispense Refill   amLODipine (NORVASC) 10 MG tablet Take 1 tablet (10 mg total) by mouth daily.     apixaban (ELIQUIS) 2.5 MG TABS tablet Take 1 tablet (2.5 mg total) by mouth 2 (two) times daily. 60 tablet 0   apixaban (ELIQUIS) 2.5 MG TABS tablet Take 1 tablet (2.5 mg total) by mouth 2 (two) times daily. 180 tablet 1   aspirin EC 81 MG tablet Take 81 mg by mouth daily. Swallow whole.     doxycycline (VIBRA-TABS) 100 MG tablet Take 1 tablet (100 mg total) by mouth 2  (two) times daily. 14 tablet 0   levothyroxine (SYNTHROID) 75 MCG tablet TAKE 1 TABLET BY MOUTH DAILY EXCEPT ON THURSDAYS TAKE 1.5 TABLETS 100 tablet 3   losartan (COZAAR) 100 MG tablet Take 1 tablet (100 mg total) by mouth daily.     metoprolol tartrate (LOPRESSOR) 25 MG tablet Take 0.5 tablets (12.5 mg total) by mouth 2 (two) times daily. 60 tablet 3   omeprazole (PRILOSEC) 20 MG capsule Take 1 capsule (20 mg total) by mouth daily. 30 capsule 6   pramipexole (MIRAPEX) 1 MG tablet Take 1 tablet (1 mg total) by mouth daily.     REPATHA SURECLICK 387 MG/ML SOAJ INJECT 140MG  EVERY 14 DAYS 2 mL 11   sulfamethoxazole-trimethoprim (BACTRIM) 400-80 MG tablet Take 1 tablet by mouth 3 (three) times a week.     tamsulosin (FLOMAX) 0.4 MG CAPS capsule Take 1 capsule (0.4 mg total) by mouth daily. 90 capsule 3   traZODone (DESYREL) 100 MG tablet Take 50-100 mg by mouth at bedtime as needed.     No current facility-administered medications for this visit.    REVIEW OF SYSTEMS:   [X]  denotes positive finding, [ ]  denotes negative finding Cardiac  Comments:  Chest pain or chest pressure:    Shortness of breath upon exertion:    Short of breath when lying flat:    Irregular heart rhythm:        Vascular    Pain in calf, thigh, or hip brought on by ambulation: x   Pain in feet at night that wakes you up from your sleep:     Blood clot in your veins:    Leg swelling:         Pulmonary    Oxygen at home:    Productive cough:     Wheezing:         Neurologic    Sudden weakness in arms or legs:     Sudden numbness in arms or legs:     Sudden onset of difficulty speaking or slurred speech:    Temporary loss of vision in one eye:     Problems with dizziness:         Gastrointestinal    Blood in stool:     Vomited blood:         Genitourinary    Burning when urinating:     Blood in urine:        Psychiatric    Major depression:         Hematologic    Bleeding problems:  Problems with  blood clotting too easily:        Skin    Rashes or ulcers:        Constitutional    Fever or chills:      PHYSICAL EXAM:   Vitals:   09/23/21 1202  BP: (!) 153/69  Pulse: 70  Resp: 16  Temp: 98 F (36.7 C)  TempSrc: Temporal  SpO2: 97%  Weight: 157 lb (71.2 kg)  Height: 5\' 7"  (1.702 m)    GENERAL: The patient is a well-nourished male, in no acute distress. The vital signs are documented above. CARDIAC: There is a regular rate and rhythm.  VASCULAR: Palpable right pedal pulse, nonpalpable left PULMONARY: Non-labored respirations ABDOMEN: Soft and non-tender  MUSCULOSKELETAL: There are no major deformities or cyanosis. NEUROLOGIC: No focal weakness or paresthesias are detected. SKIN: There are no ulcers or rashes noted. PSYCHIATRIC: The patient has a normal affect.  STUDIES:   Reviewed the following studies: Largest Aortic Diameter: Aortic atherosclerosis 2.4 cm     Renal:     Right: Normal size right kidney. Abnormal right Resistive Index.         Normal cortical thickness of right kidney. Evidence of a >         60% stenosis of the right renal artery. RRV flow present.  Left:  Normal size of left kidney. Abnormal left Resistive Index.         Normal cortical thickness of the left kidney. Evidence of a >         60% stenosis in the left renal artery. LRV flow present.  Mesenteric:  Normal Celiac artery findings. 70 to 99% stenosis in the superior  mesenteric  artery.  MEDICAL ISSUES:   Renal artery stenosis: He has a high-grade lesion in the left renal artery documented by angiography.  Ultrasound today confirms bilateral greater than 60% stenosis.  Because he has known underlying renal insufficiency as well as recently difficult to control blood pressure, I think it is reasonable to perform angiography to better evaluate his renal arteries with plans for stenting the left, possibly the right.  PAD: The patient continues to have claudication symptoms in his  right leg despite several interventions.  At the same time of his arteriogram for his renal arteries, I will study his right leg, possibly his left leg but plan to intervene on the right leg if indicated.  This will be performed in February.  He will need to be off of his Eliquis.  Because of his bleeding issues, even with intervention I will probably just keep him on Eliquis and aspirin and not add Plavix    [name], IV, MD, FACS [facility] [phone] Pager [phone]

## 2021-09-26 ENCOUNTER — Other Ambulatory Visit: Payer: Self-pay

## 2021-09-28 ENCOUNTER — Other Ambulatory Visit: Payer: Self-pay

## 2021-09-28 ENCOUNTER — Ambulatory Visit (INDEPENDENT_AMBULATORY_CARE_PROVIDER_SITE_OTHER): Payer: Medicare Other | Admitting: Pharmacist

## 2021-09-28 DIAGNOSIS — I10 Essential (primary) hypertension: Secondary | ICD-10-CM

## 2021-09-28 DIAGNOSIS — I2510 Atherosclerotic heart disease of native coronary artery without angina pectoris: Secondary | ICD-10-CM

## 2021-09-28 DIAGNOSIS — N138 Other obstructive and reflux uropathy: Secondary | ICD-10-CM

## 2021-09-28 DIAGNOSIS — N1831 Chronic kidney disease, stage 3a: Secondary | ICD-10-CM

## 2021-09-28 DIAGNOSIS — E782 Mixed hyperlipidemia: Secondary | ICD-10-CM

## 2021-09-28 DIAGNOSIS — N401 Enlarged prostate with lower urinary tract symptoms: Secondary | ICD-10-CM

## 2021-09-28 DIAGNOSIS — E039 Hypothyroidism, unspecified: Secondary | ICD-10-CM

## 2021-09-28 DIAGNOSIS — I480 Paroxysmal atrial fibrillation: Secondary | ICD-10-CM

## 2021-09-28 DIAGNOSIS — Z8719 Personal history of other diseases of the digestive system: Secondary | ICD-10-CM

## 2021-09-28 DIAGNOSIS — I739 Peripheral vascular disease, unspecified: Secondary | ICD-10-CM

## 2021-09-28 NOTE — Progress Notes (Signed)
Chronic Care Management Pharmacy Note  09/28/2021 Name:  Ryan Pittman MRN:  [id number] DOB:  November 10, 1940  Summary: -CCM initial visit: pt endorses compliance with medications except he stopped taking omeprazole (he didn't think he needed it anymore for chest pain/reflux). Discussed benefit of PPI for GI protection against bleeding given his history of bleeding and taking Eliquis + Aspirin -Pt is not sure tamsulosin is helping, he is having worsening urinary symptoms  Recommendations/Changes made from today's visit: -Restart omeprazole for GI protection -Advised pt to follow up with urologist  Plan: -[place] will call patient 3 months for adherence review -Pharmacist follow up televisit scheduled for 6 months   Subjective: Ryan Pittman is an 81 y.o. year old male who is a primary patient of Damita Dunnings, Elveria Rising, MD.  The CCM team was consulted for assistance with disease management and care coordination needs.    Engaged with patient by telephone for initial visit in response to provider referral for pharmacy case management and/or care coordination services.   Consent to Services:  The patient was given the following information about Chronic Care Management services today, agreed to services, and gave verbal consent: 1. CCM service includes personalized support from designated clinical staff supervised by the primary care provider, including individualized plan of care and coordination with other care providers 2. 24/7 contact phone numbers for assistance for urgent and routine care needs. 3. Service will only be billed when office clinical staff spend 20 minutes or more in a month to coordinate care. 4. Only one practitioner may furnish and bill the service in a calendar month. 5.The patient may stop CCM services at any time (effective at the end of the month) by phone call to the office staff. 6. The patient will be responsible for cost sharing (co-pay) of up to 20% of  the service fee (after annual deductible is met). Patient agreed to services and consent obtained.  Patient Care Team: Tonia Ghent, MD as PCP - General (Family Medicine) [name], MD as PCP - Cardiology (Cardiology) Lorelee Cover., MD as Consulting Physician (Ophthalmology) Lucky Cowboy, Erskine Squibb, MD as Referring Physician (Vascular Surgery) Dagoberto Ligas, MD as Referring Physician (Internal Medicine) Ronda Fairly. Elenor Quinones, MD as Consulting Physician (Urology) Macario Carls, [place], CCC-A as Consulting Physician (Audiology) Lanier Clam, MD (Endocrinology) Ottie Glazier, MD as Consulting Physician (Pulmonary Disease) Marilynn Rail Jossie Ng, NP as Nurse Practitioner (Cardiology) Charlton Haws, [facility] as Pharmacist (Pharmacist)  Patient lives at home with his wife.  Recent office visits: 09/09/21-PCP-Graham Duncan,MD-Patient presented for follow up hypertension. "Continue 12.49m (1/2 tab) of metoprolol twice a day.  If you have heart racing above 100, you can take an extra 1/2 tab of metoprolol and call cardiology for help.  Hold septra while taking doxycyline.  Start doxycycline and let me know if the cough doesn't get better" Continue Plavix and hold Eliquis for now.  Recent consult visits: 09/23/21 Dr BTrula Slade(Vasc surg): Eval renal artery stenosis. Rec angiography for kidneys and arteriogram for R leg 10/25/21.. Will need to hold Eliquis.   09/19/21-Nephrology-Munsoor Lateef,MD-Patient presented for follow CKD.Labs ordered(abnormal)no medication changes   09/16/21-Cardiology-Jonathan Berry,MD-Patient presented for cardiovascular evaluation due to chest pain.Discussed EKG,doppler study, labs,referral for lexiscan,stop plavix, start Eliquis 2.553mand low dose aspirin .Follow up 6 months   09/05/21-Nephrology-Munsoor Lateef,MD- Patient presented for follow up CKD. Increase amlodipine to 1051m tablet daily, labs ordered.  08/26/21-Cardiology-Kathryn Lawrence,NP-Patient presented for follow up  heart disease.EKG, take home BP readings  and bring to next appt. Start omeprazole 20 mg daily, apply zio patch monitor   08/18/21-Vascular Surgery- Madelon Lips presented for follow up PAD. Doppler studies,no medication changes.  Hospital visits: Medication Reconciliation was completed by comparing discharge summary, patients EMR and Pharmacy list, and upon discussion with patient.  Admitted to the ED on 08/25/21 due to Chest pain. Discharge date was 08/25/21. [facility]   -found in AFIB. Given IV metoprolol.  New?Medications Started at [facility] Discharge:?? -started Benzonatate due to cough  Medication Changes at Hospital Discharge: -Changed metoprolol to 25 mg BID for a few days until cardiology f/u  Medications that remain the same after Hospital Discharge:??  -All other medications will remain the same.     Objective:  Lab Results  Component Value Date   CREATININE 1.62 (H) 09/14/2021   BUN 23 09/14/2021   GFR 70.44 12/30/2020   EGFR 42 (L) 09/14/2021   NA 138 09/14/2021   K 4.7 09/14/2021   CALCIUM 9.5 09/14/2021   CO2 22 09/14/2021   GLUCOSE 91 09/14/2021    Lab Results  Component Value Date/Time   HGBA1C 6.1 (H) 07/14/2019 08:30 AM   HGBA1C 5.7 (H) 07/10/2018 11:41 AM   GFR 70.44 12/30/2020 11:28 AM   GFR 58.10 (L) 04/08/2020 08:58 AM    Last diabetic Eye exam: No results found for: HMDIABEYEEXA  Last diabetic Foot exam: No results found for: HMDIABFOOTEX   Lab Results  Component Value Date   CHOL 145 08/05/2021   HDL 78 08/05/2021   LDLCALC 53 08/05/2021   LDLDIRECT 41.0 12/30/2020   TRIG 72 08/05/2021   CHOLHDL 1.9 08/05/2021    Hepatic Function Latest Ref Rng & Units 08/05/2021 12/30/2020 02/03/2020  Total Protein 6.0 - 8.5 g/dL 6.4 6.9 6.4(L)  Albumin 3.6 - 4.6 g/dL 4.5 4.3 3.8  AST 0 - 40 IU/L _0 ALT 0 - 44 IU/L _1 Alk Phosphatase 44 - 121 IU/L 49 53 39  Total Bilirubin 0.0 - 1.2 mg/dL 0.3 0.8 0.6   Bilirubin, Direct 0.00 - 0.40 mg/dL 0.13 - -    Lab Results  Component Value Date/Time   TSH 2.246 08/25/2021 03:17 AM   TSH 1.42 12/30/2020 11:28 AM   TSH 4.04 04/08/2020 08:58 AM    CBC Latest Ref Rng & Units 09/15/2021 09/01/2021 08/25/2021  WBC 3.4 - 10.8 x10E3/uL 13.3(H) 13.8(H) 10.4  Hemoglobin 13.0 - 17.7 g/dL 13.5 13.0 12.3(L)  Hematocrit 37.5 - 51.0 % 37.8 38.7 37.2(L)  Platelets 150 - 450 x10E3/uL 211 207 195    No results found for: VD25OH  Clinical ASCVD: Yes  The ASCVD Risk score (Arnett DK, et al., 2019) failed to calculate for the following reasons:   The 2019 ASCVD risk score is only valid for ages 74 to 19    Depression screen PHQ 2/9 12/21/2020 04/25/2019 04/04/2019  Decreased Interest 0 0 0  Down, Depressed, Hopeless 0 0 0  PHQ - 2 Score 0 0 0  Altered sleeping 0 - 0  Tired, decreased energy 0 - 0  Change in appetite 0 - 0  Feeling bad or failure about yourself  0 - 0  Trouble concentrating 0 - 0  Moving slowly or fidgety/restless 0 - 0  Suicidal thoughts 0 - 0  PHQ-9 Score 0 - 0  Difficult doing work/chores Not difficult at all - -  Some recent data might be hidden     CHA2DS2/VAS Stroke Risk Points  Current as of 2 days ago (Monday)     4 >= 2 Points: High Risk  1 - 1.99 Points: Medium Risk  0 Points: Low Risk    Last Change: N/A      Points Metrics  0 Has Congestive Heart Failure:  No    Current as of 2 days ago (Monday)  1 Has Vascular Disease:  Yes    Current as of 2 days ago (Monday)  1 Has Hypertension:  Yes    Current as of 2 days ago (Monday)  2 Age:  32    Current as of 2 days ago (Monday)  0 Has Diabetes:  No    Current as of 2 days ago (Monday)  0 Had Stroke:  No  Had TIA:  No  Had Thromboembolism:  No    Current as of 2 days ago (Monday)  0 Male:  No    Current as of 2 days ago (Monday)    Social History   Tobacco Use  Smoking Status Former   Types: Cigarettes   Quit date: 09/04/2008   Years since quitting: 13.0   Smokeless Tobacco Former   Types: Chew  Tobacco Comments   HAs quit tobacco products 2009   BP Readings from Last 3 Encounters:  09/23/21 (!) 153/69  09/16/21 (!) 144/70  09/09/21 (!) 160/62   Pulse Readings from Last 3 Encounters:  09/23/21 70  09/16/21 66  09/09/21 91   Wt Readings from Last 3 Encounters:  09/23/21 157 lb (71.2 kg)  09/16/21 158 lb (71.7 kg)  09/09/21 160 lb (72.6 kg)   BMI Readings from Last 3 Encounters:  09/23/21 24.59 kg/m  09/16/21 24.75 kg/m  09/09/21 25.06 kg/m    Assessment/Interventions: Review of patient past medical history, allergies, medications, health status, including review of consultants reports, laboratory and other test data, was performed as part of comprehensive evaluation and provision of chronic care management services.   SDOH:  (Social Determinants of Health) assessments and interventions performed: Yes SDOH Interventions    Flowsheet Row Most Recent Value  SDOH Interventions   Financial Strain Interventions Intervention Not Indicated  Transportation Interventions Intervention Not Indicated      SDOH Screenings   Alcohol Screen: Low Risk    Last Alcohol Screening Score (AUDIT): 0  Depression (PHQ2-9): Low Risk    PHQ-2 Score: 0  Financial Resource Strain: Low Risk    Difficulty of Paying Living Expenses: Not hard at all  Food Insecurity: No Food Insecurity   Worried About Charity fundraiser in the Last Year: Never true   Ran Out of Food in the Last Year: Never true  Housing: Low Risk    Last Housing Risk Score: 0  Physical Activity: Inactive   Days of Exercise per Week: 0 days   Minutes of Exercise per Session: 0 min  Social Connections: Not on file  Stress: No Stress Concern Present   Feeling of Stress : Not at all  Tobacco Use: Medium Risk   Smoking Tobacco Use: Former   Smokeless Tobacco Use: Former   Passive Exposure: Not on Pensions consultant Needs: No Transportation Needs   Lack of Transportation  (Medical): No   Lack of Transportation (Non-Medical): No    CCM Care Plan  Allergies  Allergen Reactions   Penicillins Itching and Other (See Comments)    PATIENT HAS HAD A PCN REACTION WITH IMMEDIATE RASH, FACIAL/TONGUE/THROAT SWELLING, SOB, OR LIGHTHEADEDNESS WITH HYPOTENSION:  #  #  YES  #  #  Has patient had a PCN reaction causing severe rash involving mucus membranes or skin necrosis: No Has patient had a PCN reaction that required hospitalization: No Has patient had a PCN reaction occurring within the last 10 years: No If all of the above answers are "NO", then may proceed with Cephalosporin use.    Ace Inhibitors Cough   Aspirin Other (See Comments)    H/o GI bleed   Celebrex [Celecoxib] Other (See Comments)    GI bleed   Lipitor [Atorvastatin] Other (See Comments)    myalgias    Medications Reviewed Today     Reviewed by Charlton Haws, [facility] (Pharmacist) on 09/28/21 at 1154  Med List Status: <None>   Medication Order Taking? Sig Documenting Provider Last Dose Status Informant  amLODipine (NORVASC) 10 MG tablet [id number] Yes Take 1 tablet (10 mg total) by mouth daily. Tonia Ghent, MD Taking Active   apixaban Arne Cleveland) 2.5 MG TABS tablet [id number] Yes Take 1 tablet (2.5 mg total) by mouth 2 (two) times daily. [name], MD Taking Active   aspirin EC 81 MG tablet [id number] Yes Take 81 mg by mouth daily. Swallow whole. [provider] Taking Active   levothyroxine (SYNTHROID) 75 MCG tablet [id number] Yes TAKE 1 TABLET BY MOUTH DAILY EXCEPT ON THURSDAYS TAKE 1.5 TABLETS Tonia Ghent, MD Taking Active   losartan (COZAAR) 100 MG tablet [id number] Yes Take 1 tablet (100 mg total) by mouth daily. Tonia Ghent, MD Taking Active   metoprolol tartrate (LOPRESSOR) 25 MG tablet [id number] Yes Take 0.5 tablets (12.5 mg total) by mouth 2 (two) times daily. Lendon Colonel, NP Taking Active   omeprazole (PRILOSEC) 20 MG capsule [id number] No Take 1 capsule  (20 mg total) by mouth daily.  Patient not taking: Reported on 09/28/2021   Lendon Colonel, NP Not Taking Active   pramipexole (MIRAPEX) 1 MG tablet [id number] Yes Take 1 tablet (1 mg total) by mouth daily. Tonia Ghent, MD Taking Active   REPATHA SURECLICK 184 MG/ML Darden Palmer [id number] Yes INJECT 140MG EVERY 14 DAYS [name], MD Taking Active   sulfamethoxazole-trimethoprim (BACTRIM) 400-80 MG tablet [id number] Yes Take 1 tablet by mouth 3 (three) times a week. [provider] Taking Active   tamsulosin (FLOMAX) 0.4 MG CAPS capsule [id number] Yes Take 1 capsule (0.4 mg total) by mouth daily. Abbie Sons, MD Taking Active   traZODone (DESYREL) 100 MG tablet [id number] Yes Take 50-100 mg by mouth at bedtime as needed. 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   Renal artery stenosis ([place]) 07/04/2016   BCC (basal cell carcinoma), 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   Hyperglycemia 12/28/2015   Hyperlipidemia 12/28/2015   Thyroid cancer ([place]) 11/12/2014   Shortness of breath 04/22/2014   Spasm 04/22/2014   RLS (restless legs syndrome) 11/16/2013   Insomnia 06/18/2013   Medicare annual wellness visit, subsequent 03/12/2012  HTN (hypertension) 08/14/2011   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   CKD (chronic kidney disease) stage 3, GFR 30-59 ml/min (HCC) 02/13/2011   OA  (osteoarthritis) 02/13/2011   Carotid stenosis 02/13/2011   ED (erectile dysfunction) 02/13/2011   Duodenal ulcer 02/13/2011   Diverticulosis 02/13/2011   PVD (peripheral vascular disease) ([place]) 01/26/2011   S/P bypass graft of extremity 01/26/2011   Back pain 01/26/2011   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   Nocturia 05/12/2008   Urinary hesitancy 05/12/2008    Immunization History  Administered Date(s) Administered   Influenza, High Dose Seasonal PF 05/19/2016, 05/19/2019, 06/01/2020   Influenza-Unspecified 06/18/2014, 07/02/2017, 06/12/2018   Moderna SARS-COV2 Booster Vaccination 08/03/2020   Moderna Sars-Covid-2 Vaccination 10/18/2019, 11/15/2019   Pneumococcal Conjugate-13 04/07/2014   Pneumococcal Polysaccharide-23 04/28/2010   Td 03/12/2012   Zoster, Live 06/07/2010    Conditions to be addressed/monitored:  Hypertension, Hyperlipidemia, Atrial Fibrillation, Coronary Artery Disease, Chronic Kidney Disease, Hypothyroidism, and BPH, PAD, Hx GI bleed  Care Plan : CCM Pharmacy Care Plan  Updates made by Charlton Haws, RPH since 09/28/2021 12:00 AM     Problem: Hypertension, Hyperlipidemia, Atrial Fibrillation, Coronary Artery Disease, Chronic Kidney Disease, Hypothyroidism, and BPH, PAD, Hx GI bleed   Priority: High     Long-Range Goal: Disease mgmt   Start Date: 09/28/2021  Expected End Date: 09/28/2022  This Visit's Progress: On track  Priority: High  Note:   Current Barriers:  Unable to independently monitor therapeutic efficacy  Pharmacist Clinical Goal(s):  Patient will achieve adherence to monitoring guidelines and medication adherence to achieve therapeutic efficacy through collaboration with PharmD and provider.   Interventions: 1:1 collaboration with Tonia Ghent, MD regarding development and update of comprehensive plan of care as evidenced by provider attestation and co-signature Inter-disciplinary care team collaboration (see longitudinal plan of  care) Comprehensive medication review performed; medication list updated in electronic medical record  Hypertension / CKD Stage 3a (BP goal <140/90) -Controlled - home BP is at goal per pt report after recent med changes; he has also cut out pork/country ham; Denies hypotensive/hypertensive symptoms -Current home readings: 130/60 -Current treatment: Amlodipine 10 mg daily - Appropriate, Effective, Safe, Accessible Losartan 100 mg daily -Appropriate, Effective, Safe, Accessible Metoprolol tartrate 25 mg - 1/2 tab BID -Appropriate, Effective, Safe, Accessible -Educated on BP goals and benefits of medications for prevention of heart attack, stroke and kidney damage; Daily salt intake goal < 2300 mg; Importance of home blood pressure monitoring; -Counseled to monitor BP at home daily, document, and provide log at future appointments -Recommended to continue current medication  Hyperlipidemia: (LDL goal < 70) -Controlled - LDL is at goal; pt endorses compliance with current regimen; he reports Repatha is currently affordable -Hx CAD (CABGx 5 2019), PAD s/p multiple stents -Current treatment: Repatha Sureclick 409 mg B35 days -Appropriate, Effective, Safe, Accessible Aspirin 81 mg daily -Appropriate, Effective, Safe, Accessible -Medications previously tried: aspirin (GI bleed), atorvastatin, clopidogrel -Educated on Cholesterol goals;  benefits of aspirin given multiple stents and hx CABG -Recommended to continue current medication  Atrial Fibrillation (Goal: prevent stroke and major bleeding) -Controlled  -CHADSVASC: 4; hx GI bleed 2/2 celebrex; per cardiology 09/2021 decision was made to continue with anticoagulation given benefits > risk -Current treatment: Metoprolol tartrate 25 mg - 1/2 tab BID -Appropriate, Effective, Safe, Accessible Eliquis 2.5 mg BID -Appropriate, Effective, Query Safe, Accessible -Medications previously tried: amiodarone, digoxin -Counseled on increased risk of  stroke due to Afib and benefits of anticoagulation for stroke  prevention; importance of adherence to anticoagulant exactly as prescribed; bleeding risk associated with Eliquis and importance of self-monitoring for signs/symptoms of bleeding; avoidance of NSAIDs due to increased bleeding risk with anticoagulants; seeking medical attention after a head injury or if there is blood in the urine/stool; -See below - advised pt to restart omeprazole for GI bleed prophylaxis -Recommended to continue current medication  Hx GI Bleed / Diverticulosis (Goal: prevent recurrence) -Not ideally controlled - pt was prescribed PPI last month after ED visit for chest pain; he stopped taking it after a few weeks -Current treatment  Omeprazole 20 mg daily -Appropriate, Effective, Safe, Accessible -Discussed benefits of PPI given hx of GI bleed and current Eliquis + aspirin therapy -Recommended to restart omeprazole 20 mg daily for GI bleed protection  Hypothyroidism (Goal: maintain TSH in goal range) -Controlled - TSH is at goal; pt endorses compliance with current regimen; he has seen endocrinology previously for thyroid issues but has not been back in over a year (last OV 06/2020), he has about 5 days remaining on current prescription and has not been able to get a refill -Current treatment  Levothyroxine 75 mcg daily except 1.5 tab once a week -Appropriate, Effective, Safe, Accessible -Advised pt to contact endocrine office for refill, he may need a f/u appt -Recommended to continue current medication  BPH (Goal: manage symptoms) -Not ideally controlled - pt is not sure if tamsulosin is helping -Managed per Dr Bernardo Heater  -Current treatment  Tamsulosin 0.4 mg daily -Appropriate, Query Effective, Safe, Accessible -Advised pt to f/u with urology regarding BPH control -Recommended to continue current medication  Insomnia / RLS (Goal: manage symptoms) -Controlled - pt reports pramipexole is somewhat helpful for  RLS but he still struggles with symptoms sometimes; he does not use it every day -Current treatment  Pramipexole 1 mg daily -Appropriate, Effective, Safe, Accessible Trazodone 100 mg PRN -Appropriate, Effective, Safe, Accessible Hydroxyzine 10 mg PRN -Appropriate, Effective, Safe, Accessible -Discussed using pramipexole each night to prevent RLS -Recommended to continue current medication  Health Maintenance -Vaccine gaps: Flu, Shingrix, covid booster -Hx of Thyroid cancer, basal cell carcinoma -Current therapy:  Bactrim SS 3 times weekly Appropriate, Effective, Safe, Accessible (chronic suppressive therapy for bronchiectasis) Vitamin C  -Patient is satisfied with current therapy and denies issues -Recommended to continue current medication  Patient Goals/Self-Care Activities Patient will:  - take medications as prescribed as evidenced by patient report and record review focus on medication adherence by pill box check blood pressure daily, document, and provide at future appointments engage in dietary modifications by limiting salty foods       Medication Assistance: None required.  Patient affirms current coverage meets needs. - may need assistance for Eliquis and/or Repatha during donut hole.  Compliance/Adherence/Medication fill history: Care Gaps: Colonoscopy (due 05/03/18)  Star-Rating Drugs: Losartan - LF 09/06/21 x 30 ds ([place] 34%)  Patient's preferred pharmacy is:  [facility], [address] [place] [address] Phone: [phone] Fax: [phone]  [place], [place] [address] [place] [address] Phone: [phone] Fax: [phone]  Uses pill box? Yes Pt endorses 100% compliance  We discussed: Current pharmacy is preferred with insurance plan and patient is satisfied with pharmacy services Patient decided to: Continue current medication management strategy  Care Plan  and Follow Up Patient Decision:  Patient agrees to Care Plan and Follow-up.  Plan: Telephone follow up appointment  with care management team member scheduled for:  6 months  [name], PharmD, BCACP Clinical Pharmacist [facility] [phone]

## 2021-09-29 ENCOUNTER — Telehealth: Payer: Self-pay | Admitting: Family Medicine

## 2021-09-29 MED ORDER — LEVOTHYROXINE SODIUM 75 MCG PO TABS: ORAL_TABLET | ORAL | 3 refills | Status: DC

## 2021-09-29 NOTE — Addendum Note (Signed)
Addended by: Carter Kitten on: 09/29/2021 11:34 AM   Modules accepted: Orders

## 2021-09-29 NOTE — Telephone Encounter (Signed)
Refill sent as requested. 

## 2021-09-29 NOTE — Telephone Encounter (Signed)
°  Encourage patient to contact the pharmacy for refills or they can request refills through [place]:  Please schedule appointment if longer than 1 year  NEXT APPOINTMENT DATE:12/22/21  MEDICATION:levothyroxine (SYNTHROID) 75 MCG tablet  Is the patient out of medication?   [place], [place]  Let patient know to contact pharmacy at the end of the day to make sure medication is ready.  Please notify patient to allow 48-72 hours to process  CLINICAL FILLS OUT ALL BELOW:   LAST REFILL:  QTY:  REFILL DATE:    OTHER COMMENTS:    Okay for refill?  Please advise

## 2021-09-30 NOTE — Patient Instructions (Addendum)
Visit Information  Phone number for Pharmacist: [phone]  Thank you for meeting with me to discuss your medications! I look forward to working with you to achieve your health care goals. Below is a summary of what we talked about during the visit:   Goals Addressed             This Visit's Progress    Track and Manage My Blood Pressure-Hypertension       Timeframe:  Long-Range Goal Priority:  High Start Date:            09/28/21                 Expected End Date:        09/28/22               Follow Up Date July 2023  - check blood pressure daily - choose a place to take my blood pressure (home, clinic or office, retail store) - write blood pressure results in a log or diary    Why is this important?   You won't feel high blood pressure, but it can still hurt your blood vessels.  High blood pressure can cause heart or kidney problems. It can also cause a stroke.  Making lifestyle changes like losing a little weight or eating less salt will help.  Checking your blood pressure at home and at different times of the day can help to control blood pressure.  If the doctor prescribes medicine remember to take it the way the doctor ordered.  Call the office if you cannot afford the medicine or if there are questions about it.     Notes:         Patient Care Plan: CCM Pharmacy Care Plan     Problem Identified: Hypertension, Hyperlipidemia, Atrial Fibrillation, Coronary Artery Disease, Chronic Kidney Disease, Hypothyroidism, and BPH, PAD, Hx GI bleed   Priority: High     Long-Range Goal: Disease mgmt   Start Date: 09/28/2021  Expected End Date: 09/28/2022  This Visit's Progress: On track  Priority: High  Note:   Current Barriers:  Unable to independently monitor therapeutic efficacy  Pharmacist Clinical Goal(s):  Patient will achieve adherence to monitoring guidelines and medication adherence to achieve therapeutic efficacy through collaboration with PharmD and provider.    Interventions: 1:1 collaboration with Tonia Ghent, MD regarding development and update of comprehensive plan of care as evidenced by provider attestation and co-signature Inter-disciplinary care team collaboration (see longitudinal plan of care) Comprehensive medication review performed; medication list updated in electronic medical record  Hypertension / CKD Stage 3a (BP goal <140/90) -Controlled - home BP is at goal per pt report after recent med changes; he has also cut out pork/country ham; Denies hypotensive/hypertensive symptoms -Current home readings: 130/60 -Current treatment: Amlodipine 10 mg daily - Appropriate, Effective, Safe, Accessible Losartan 100 mg daily -Appropriate, Effective, Safe, Accessible Metoprolol tartrate 25 mg - 1/2 tab BID -Appropriate, Effective, Safe, Accessible -Educated on BP goals and benefits of medications for prevention of heart attack, stroke and kidney damage; Daily salt intake goal < 2300 mg; Importance of home blood pressure monitoring; -Counseled to monitor BP at home daily, document, and provide log at future appointments -Recommended to continue current medication  Hyperlipidemia: (LDL goal < 70) -Controlled - LDL is at goal; pt endorses compliance with current regimen; he reports Repatha is currently affordable -Hx CAD (CABGx 5 2019), PAD s/p multiple stents -Current treatment: Repatha Sureclick 474 mg Q59 days -Appropriate,  Effective, Safe, Accessible Aspirin 81 mg daily -Appropriate, Effective, Safe, Accessible -Medications previously tried: aspirin (GI bleed), atorvastatin, clopidogrel -Educated on Cholesterol goals;  benefits of aspirin given multiple stents and hx CABG -Recommended to continue current medication  Atrial Fibrillation (Goal: prevent stroke and major bleeding) -Controlled  -CHADSVASC: 4; hx GI bleed 2/2 celebrex; per cardiology 09/2021 decision was made to continue with anticoagulation given benefits > risk -Current  treatment: Metoprolol tartrate 25 mg - 1/2 tab BID -Appropriate, Effective, Safe, Accessible Eliquis 2.5 mg BID -Appropriate, Effective, Query Safe, Accessible -Medications previously tried: amiodarone, digoxin -Counseled on increased risk of stroke due to Afib and benefits of anticoagulation for stroke prevention; importance of adherence to anticoagulant exactly as prescribed; bleeding risk associated with Eliquis and importance of self-monitoring for signs/symptoms of bleeding; avoidance of NSAIDs due to increased bleeding risk with anticoagulants; seeking medical attention after a head injury or if there is blood in the urine/stool; -See below - advised pt to restart omeprazole for GI bleed prophylaxis -Recommended to continue current medication  Hx GI Bleed / Diverticulosis (Goal: prevent recurrence) -Not ideally controlled - pt was prescribed PPI last month after ED visit for chest pain; he stopped taking it after a few weeks -Current treatment  Omeprazole 20 mg daily -Appropriate, Effective, Safe, Accessible -Discussed benefits of PPI given hx of GI bleed and current Eliquis + aspirin therapy -Recommended to restart omeprazole 20 mg daily for GI bleed protection  Hypothyroidism (Goal: maintain TSH in goal range) -Controlled - TSH is at goal; pt endorses compliance with current regimen; he has seen endocrinology previously for thyroid issues but has not been back in over a year (last OV 06/2020), he has about 5 days remaining on current prescription and has not been able to get a refill -Current treatment  Levothyroxine 75 mcg daily except 1.5 tab once a week -Appropriate, Effective, Safe, Accessible -Advised pt to contact endocrine office for refill, he may need a f/u appt -Recommended to continue current medication  BPH (Goal: manage symptoms) -Not ideally controlled - pt is not sure if tamsulosin is helping -Managed per Dr Bernardo Heater  -Current treatment  Tamsulosin 0.4 mg daily  -Appropriate, Query Effective, Safe, Accessible -Advised pt to f/u with urology regarding BPH control -Recommended to continue current medication  Insomnia / RLS (Goal: manage symptoms) -Controlled - pt reports pramipexole is somewhat helpful for RLS but he still struggles with symptoms sometimes; he does not use it every day -Current treatment  Pramipexole 1 mg daily -Appropriate, Effective, Safe, Accessible Trazodone 100 mg PRN -Appropriate, Effective, Safe, Accessible Hydroxyzine 10 mg PRN -Appropriate, Effective, Safe, Accessible -Discussed using pramipexole each night to prevent RLS -Recommended to continue current medication  Health Maintenance -Vaccine gaps: Flu, Shingrix, covid booster -Hx of Thyroid cancer, basal cell carcinoma -Current therapy:  Bactrim SS 3 times weekly Appropriate, Effective, Safe, Accessible (chronic suppressive therapy for bronchiectasis) Vitamin C  -Patient is satisfied with current therapy and denies issues -Recommended to continue current medication  Patient Goals/Self-Care Activities Patient will:  - take medications as prescribed as evidenced by patient report and record review focus on medication adherence by pill box check blood pressure daily, document, and provide at future appointments engage in dietary modifications by limiting salty foods      Mr. Griess was given information about Chronic Care Management services today including:  CCM service includes personalized support from designated clinical staff supervised by his physician, including individualized plan of care and coordination with other care providers 24/7  contact phone numbers for assistance for urgent and routine care needs. Standard insurance, coinsurance, copays and deductibles apply for chronic care management only during months in which we provide at least 20 minutes of these services. Most insurances cover these services at 100%, however patients may be responsible for any  copay, coinsurance and/or deductible if applicable. This service may help you avoid the need for more expensive face-to-face services. Only one practitioner may furnish and bill the service in a calendar month. The patient may stop CCM services at any time (effective at the end of the month) by phone call to the office staff.  Patient agreed to services and verbal consent obtained.   Patient verbalizes understanding of instructions and care plan provided today and agrees to view in [place]. Active MyChart status confirmed with patient.   Telephone follow up appointment with pharmacy team member scheduled for: 6 months  [name], PharmD, [facility] Clinical Pharmacist [facility] [phone]

## 2021-10-03 ENCOUNTER — Telehealth: Payer: Self-pay | Admitting: Family Medicine

## 2021-10-03 NOTE — Telephone Encounter (Signed)
Spoke with patient about his BP and dizziness he has been having the past couple of days. Patient states his upper numbers have been in the 120s but lower have been in the 50s/60s the past couple of days. He has only been slightly dizzy when he first stands up. I looked back in his chart and his BP was 150/62 on 09/19/21 at the kidney doc and was 144/70 09/16/21 at cardio. Patient states today his BP was 122/57 this am.

## 2021-10-03 NOTE — Telephone Encounter (Signed)
Patient's daughter Maudie Mercury) called reference her father. He didn't know she was calling but wanted to make Dr. Damita Dunnings aware that his lower number on his BP has been in the 23s recently, she noticed he was slightly dizzy yesterday  Informed Maudie Mercury that she may want to talk with her dad or her mom to have them call since she wasn't on the DPR  Daughter understood

## 2021-10-04 DIAGNOSIS — E785 Hyperlipidemia, unspecified: Secondary | ICD-10-CM

## 2021-10-04 DIAGNOSIS — I129 Hypertensive chronic kidney disease with stage 1 through stage 4 chronic kidney disease, or unspecified chronic kidney disease: Secondary | ICD-10-CM | POA: Diagnosis not present

## 2021-10-04 DIAGNOSIS — I4891 Unspecified atrial fibrillation: Secondary | ICD-10-CM

## 2021-10-04 DIAGNOSIS — N400 Enlarged prostate without lower urinary tract symptoms: Secondary | ICD-10-CM | POA: Diagnosis not present

## 2021-10-04 DIAGNOSIS — N1831 Chronic kidney disease, stage 3a: Secondary | ICD-10-CM | POA: Diagnosis not present

## 2021-10-04 DIAGNOSIS — E039 Hypothyroidism, unspecified: Secondary | ICD-10-CM

## 2021-10-04 MED ORDER — AMLODIPINE BESYLATE 10 MG PO TABS: 5.0000 mg | ORAL_TABLET | Freq: Every day | ORAL | Status: DC

## 2021-10-04 NOTE — Telephone Encounter (Signed)
If he has persistent progressive or bothersome symptoms then we likely need to adjust his medication.  If he has relatively mild and brief symptoms that are not getting worse, then I would try increasing his fluid intake slightly.  Make sure he is drinking enough water to keep his urine clear.  If he is doing that but he still getting lightheaded upon standing, then I would cut his amlodipine back to 5 mg a day.  Please let me know how that goes.  Thanks.

## 2021-10-04 NOTE — Addendum Note (Signed)
Addended by: Tonia Ghent on: 10/04/2021 09:13 AM   Modules accepted: Orders

## 2021-10-04 NOTE — Telephone Encounter (Signed)
Spoke with patient and discussed below with him. Patient took BP last night and was 132/55 with no dizziness and also stated he has not had any dizziness this morning. Patient will increase fluids and call back if he continues to have any dizziness or drops in BP.

## 2021-10-05 ENCOUNTER — Telehealth: Payer: Self-pay

## 2021-10-05 NOTE — Telephone Encounter (Signed)
Agreed will d/w pt at [place].  Thanks.

## 2021-10-05 NOTE — Telephone Encounter (Signed)
Pt's wife called to let us know pt has been slightly short of breath with walking since his cardiologist changed his meds. He is scheduled for AGM in a few weeks and is waiting to hear back from cardiologist regarding meds being altered. I have told her to let us know if anything changes and they need to r/s AGM. She verbalized understanding; no further questions/concerns at this time.

## 2021-10-05 NOTE — Telephone Encounter (Signed)
I think this can wait for PCP eval tomorrow.

## 2021-10-05 NOTE — Telephone Encounter (Signed)
[facility] - Client TELEPHONE ADVICE RECORD [facility] Patient Name: Ryan Pittman Gender: Male DOB: 09/17/1940 Age: 81 Y 60 M 20 D Return Phone Number: [phone] (Primary), [phone] (Secondary) Address: City/ State/ Zip: [place]  [address] Client [place] [facility] - Client Client Site [place] - Day Provider Renford Dills - MD Contact Type Call Who Is Calling Patient / Member / Family / Caregiver Call Type Triage / Clinical Caller Name Gracin Mcpartland Relationship To Patient Spouse Return Phone Number [phone] (Primary) Chief Complaint CHEST PAIN - pain, pressure, heaviness or tightness Reason for Call Symptomatic / Request for [place] states husband is not steady when he walks and he has pain in his chest. Translation No Nurse Assessment Nurse: Hassell Done, RN, Melanie Date/Time ([place] Time): 10/05/2021 2:02:49 PM Confirm and document reason for call. If symptomatic, describe symptoms. ---Caller states he can feel his heartbeat at night and he gets out of breath when he walks. these are new in that they are new symptoms.Ever since covid he has been off of balance. to have vein surgery Does the patient have any new or worsening symptoms? ---Yes Will a triage be completed? ---Yes Related visit to physician within the last 2 weeks? ---No Does the PT have any chronic conditions? (i.e. diabetes, asthma, this includes High risk factors for pregnancy, etc.) ---No Is this a behavioral health or substance abuse call? ---No Guidelines Guideline Title Affirmed Question Affirmed Notes Nurse Date/Time (Eastern Time) Breathing Difficulty [1] MILD longstanding difficulty breathing AND [2] SAME as normal Hassell Done, RN, Threasa Beards 10/05/2021 2:07:40 PM Neurologic Deficit [1] Weakness of the face, arm / hand, or leg / foot on one side of the body AND [2] Arnaldo Natal 10/05/2021 2:14:55 PM PLEASE NOTE: All timestamps contained within this report are represented as [place] Standard Time. CONFIDENTIALTY NOTICE: This fax transmission is intended only for the addressee. It contains information that is legally privileged, confidential or otherwise protected from use or disclosure. If you are not the intended recipient, you are strictly prohibited from reviewing, disclosing, copying using or disseminating any of this information or taking any action in reliance on or regarding this information. If you have received this fax in error, please notify us immediately by telephone so that we can arrange for its return to [place]. Phone: [phone], Toll-Free: [phone], Fax: [phone] Page: 2 of 2 Call Id: [id number] Guidelines Guideline Title Affirmed Question Affirmed Notes Nurse Date/Time Eilene Ghazi Time) gradual onset (e.g., days to weeks) AND [3] present now Disp. Time Eilene Ghazi Time) Disposition Final User 10/05/2021 1:59:18 PM Send to Urgent Annia Friendly 10/05/2021 2:14:05 PM See PCP within [place], RN, [facility] 10/05/2021 2:18:26 PM See HCP within 4 Hours (or PCP triage) Yes Hassell Done, RN, Donnajean Lopes Disagree/Comply Comply Caller Understands Yes PreDisposition Call Doctor Care Advice Given Per Guideline SEE PCP WITHIN 2 WEEKS: * You need to be seen for this ongoing problem within the next 2 weeks. CALL BACK IF: * Severe difficulty breathing occurs SEE HCP (OR PCP TRIAGE) WITHIN 4 HOURS: * IF OFFICE WILL BE OPEN: You need to be seen within the next 3 or 4 hours. Call your doctor (or NP/PA) now or as soon as the office opens. CALL BACK IF: * You become worse CARE ADVICE given per Neurologic Deficit (Adult) guideline. Comments User: Verner Chol, RN Date/Time Eilene Ghazi Time): 10/05/2021 2:19:50 PM Caller denied chest pain but did endorse weakness in  his leg and increasing SOB Referrals REFERRED TO PCP OFFICE REFERRED TO PCP OFFIC

## 2021-10-05 NOTE — Telephone Encounter (Addendum)
Called patient left message. Letter to be mailed out 10/05/2021----- Message from Lendon Colonel, NP sent at 09/20/2021  5:31 PM EST ----- I have reviewed his echocardiogram. His heart pumping function is normal. His left ventricle is slightly stiff. The aortic valve shows some mild stiffening when it opens and closes, but not of concern. His mitral valve (the valve between the left top chamber of his heart chamber and bottom chamber) of his heart is normal. Good report.   KL

## 2021-10-05 NOTE — Telephone Encounter (Addendum)
I spoke with pt's wife (DPR signed) said someone called and talked with pt and pts wife for a long time and between talking with that person (Mrs Glace not sure who that was ) and talking with the vein specialist office and cardiology pts wife said they are really confused. Plavix was stopped and Eliquis was started and pts wife knows they are both blood thinners but some people do well on some meds and some people do not. Mrs Yakubov feels like pt is progressively getting worse with the weakness and she does not think it is the pt's  heart due to him having several test for his heart and they were all good. Pt is supposed to have a myocardia perfusion next wk also and Mrs Marhefka wants to know if Dr Damita Dunnings thinks it is a good idea for pt to have the test and does Dr Damita Dunnings think it is safe. Mrs Shallenberger said Dr Damita Dunnings knows pt and they have all confidence in Dr Damita Dunnings and Mrs Bowns scheduled in office 30' appt with Dr Damita Dunnings on 10/06/21 at 3:00 pm. UC & ED precautions given and pts wife voiced understanding. No covid symptoms or known exposures. Sending note to Dr Damita Dunnings who is out of office today, Janett Billow CMA and Dr Darnell Level who is in office today.

## 2021-10-06 ENCOUNTER — Other Ambulatory Visit: Payer: Self-pay

## 2021-10-06 ENCOUNTER — Ambulatory Visit (INDEPENDENT_AMBULATORY_CARE_PROVIDER_SITE_OTHER): Payer: Medicare Other | Admitting: Family Medicine

## 2021-10-06 VITALS — BP 136/64 | HR 85 | Temp 97.9°F | Ht 67.0 in | Wt 162.0 lb

## 2021-10-06 DIAGNOSIS — R195 Other fecal abnormalities: Secondary | ICD-10-CM | POA: Diagnosis not present

## 2021-10-06 DIAGNOSIS — I701 Atherosclerosis of renal artery: Secondary | ICD-10-CM

## 2021-10-06 NOTE — Patient Instructions (Addendum)
Go to the lab on the way out.   If you have mychart we'll likely use that to update you.    Take care.  Glad to see you.  Don't change your meds yet.  If your BP is persistently above 140/90, then try increasing the amlodipine to 10mg  a day.  Otherwise take 5mg  a day.

## 2021-10-06 NOTE — Progress Notes (Signed)
This visit occurred during the SARS-CoV-2 public health emergency.  Safety protocols were in place, including screening questions prior to the visit, additional usage of staff PPE, and extensive cleaning of exam room while observing appropriate contact time as indicated for disinfecting solutions.  We talked about cardiology rec for aspirin and eliquis and not taking plavix.  He noted less exercise tolerance since the med change.  He had noted some indigestion at night, when he was rest.  He doesn't have indigestion feeling with walking.  No frank bleeding.  He has had some dark stools over the last few weeks.  He can get leg cramping going up stairs or even walking on flat ground.    Meds, vitals, and allergies reviewed.   ROS: Per HPI unless specifically indicated in ROS section   GEN: nad, alert and oriented HEENT: mucous membranes moist NECK: supple w/o LA CV: rrr. SEM noted PULM: ctab, no inc wob ABD: soft, +bs EXT: no edema SKIN: Well-perfused.

## 2021-10-07 ENCOUNTER — Telehealth: Payer: Self-pay | Admitting: Family Medicine

## 2021-10-07 ENCOUNTER — Telehealth: Payer: Self-pay | Admitting: Radiology

## 2021-10-07 ENCOUNTER — Encounter: Payer: Self-pay | Admitting: Intensive Care

## 2021-10-07 ENCOUNTER — Inpatient Hospital Stay
Admission: EM | Admit: 2021-10-07 | Discharge: 2021-10-10 | DRG: 813 | Disposition: A | Discharge status: Home / Self Care | Payer: Medicare Other | Attending: Obstetrics and Gynecology | Admitting: Obstetrics and Gynecology

## 2021-10-07 ENCOUNTER — Other Ambulatory Visit: Payer: Self-pay

## 2021-10-07 DIAGNOSIS — R195 Other fecal abnormalities: Secondary | ICD-10-CM | POA: Diagnosis not present

## 2021-10-07 DIAGNOSIS — K648 Other hemorrhoids: Secondary | ICD-10-CM | POA: Diagnosis not present

## 2021-10-07 DIAGNOSIS — K640 First degree hemorrhoids: Secondary | ICD-10-CM | POA: Diagnosis present

## 2021-10-07 DIAGNOSIS — K2970 Gastritis, unspecified, without bleeding: Secondary | ICD-10-CM | POA: Diagnosis not present

## 2021-10-07 DIAGNOSIS — Z951 Presence of aortocoronary bypass graft: Secondary | ICD-10-CM

## 2021-10-07 DIAGNOSIS — Z823 Family history of stroke: Secondary | ICD-10-CM | POA: Diagnosis not present

## 2021-10-07 DIAGNOSIS — D6832 Hemorrhagic disorder due to extrinsic circulating anticoagulants: Principal | ICD-10-CM | POA: Diagnosis present

## 2021-10-07 DIAGNOSIS — Z20822 Contact with and (suspected) exposure to covid-19: Secondary | ICD-10-CM | POA: Diagnosis present

## 2021-10-07 DIAGNOSIS — I10 Essential (primary) hypertension: Secondary | ICD-10-CM | POA: Diagnosis present

## 2021-10-07 DIAGNOSIS — N138 Other obstructive and reflux uropathy: Secondary | ICD-10-CM | POA: Diagnosis present

## 2021-10-07 DIAGNOSIS — N1831 Chronic kidney disease, stage 3a: Secondary | ICD-10-CM | POA: Diagnosis present

## 2021-10-07 DIAGNOSIS — K5730 Diverticulosis of large intestine without perforation or abscess without bleeding: Secondary | ICD-10-CM | POA: Diagnosis not present

## 2021-10-07 DIAGNOSIS — Z87891 Personal history of nicotine dependence: Secondary | ICD-10-CM | POA: Diagnosis not present

## 2021-10-07 DIAGNOSIS — K5521 Angiodysplasia of colon with hemorrhage: Secondary | ICD-10-CM | POA: Diagnosis present

## 2021-10-07 DIAGNOSIS — E890 Postprocedural hypothyroidism: Secondary | ICD-10-CM | POA: Diagnosis present

## 2021-10-07 DIAGNOSIS — D62 Acute posthemorrhagic anemia: Secondary | ICD-10-CM | POA: Diagnosis present

## 2021-10-07 DIAGNOSIS — I739 Peripheral vascular disease, unspecified: Secondary | ICD-10-CM | POA: Diagnosis present

## 2021-10-07 DIAGNOSIS — Z886 Allergy status to analgesic agent status: Secondary | ICD-10-CM

## 2021-10-07 DIAGNOSIS — N401 Enlarged prostate with lower urinary tract symptoms: Secondary | ICD-10-CM | POA: Diagnosis not present

## 2021-10-07 DIAGNOSIS — Z880 Allergy status to penicillin: Secondary | ICD-10-CM

## 2021-10-07 DIAGNOSIS — Z79890 Hormone replacement therapy (postmenopausal): Secondary | ICD-10-CM

## 2021-10-07 DIAGNOSIS — K921 Melena: Secondary | ICD-10-CM | POA: Diagnosis not present

## 2021-10-07 DIAGNOSIS — Z7982 Long term (current) use of aspirin: Secondary | ICD-10-CM

## 2021-10-07 DIAGNOSIS — Z7901 Long term (current) use of anticoagulants: Secondary | ICD-10-CM

## 2021-10-07 DIAGNOSIS — I480 Paroxysmal atrial fibrillation: Secondary | ICD-10-CM | POA: Diagnosis present

## 2021-10-07 DIAGNOSIS — I2510 Atherosclerotic heart disease of native coronary artery without angina pectoris: Secondary | ICD-10-CM | POA: Diagnosis present

## 2021-10-07 DIAGNOSIS — N183 Chronic kidney disease, stage 3 unspecified: Secondary | ICD-10-CM | POA: Diagnosis present

## 2021-10-07 DIAGNOSIS — E039 Hypothyroidism, unspecified: Secondary | ICD-10-CM | POA: Diagnosis not present

## 2021-10-07 DIAGNOSIS — R0602 Shortness of breath: Secondary | ICD-10-CM | POA: Diagnosis not present

## 2021-10-07 DIAGNOSIS — T45515A Adverse effect of anticoagulants, initial encounter: Secondary | ICD-10-CM | POA: Diagnosis not present

## 2021-10-07 DIAGNOSIS — J9602 Acute respiratory failure with hypercapnia: Secondary | ICD-10-CM | POA: Diagnosis present

## 2021-10-07 DIAGNOSIS — K219 Gastro-esophageal reflux disease without esophagitis: Secondary | ICD-10-CM | POA: Diagnosis present

## 2021-10-07 DIAGNOSIS — Z79899 Other long term (current) drug therapy: Secondary | ICD-10-CM

## 2021-10-07 DIAGNOSIS — Z833 Family history of diabetes mellitus: Secondary | ICD-10-CM | POA: Diagnosis not present

## 2021-10-07 DIAGNOSIS — D649 Anemia, unspecified: Secondary | ICD-10-CM | POA: Diagnosis not present

## 2021-10-07 DIAGNOSIS — D500 Iron deficiency anemia secondary to blood loss (chronic): Secondary | ICD-10-CM | POA: Diagnosis not present

## 2021-10-07 DIAGNOSIS — Z86010 Personal history of colonic polyps: Secondary | ICD-10-CM | POA: Diagnosis not present

## 2021-10-07 DIAGNOSIS — Z888 Allergy status to other drugs, medicaments and biological substances status: Secondary | ICD-10-CM

## 2021-10-07 DIAGNOSIS — K922 Gastrointestinal hemorrhage, unspecified: Secondary | ICD-10-CM | POA: Diagnosis not present

## 2021-10-07 DIAGNOSIS — I129 Hypertensive chronic kidney disease with stage 1 through stage 4 chronic kidney disease, or unspecified chronic kidney disease: Secondary | ICD-10-CM | POA: Diagnosis present

## 2021-10-07 DIAGNOSIS — D509 Iron deficiency anemia, unspecified: Secondary | ICD-10-CM | POA: Diagnosis not present

## 2021-10-07 DIAGNOSIS — J9601 Acute respiratory failure with hypoxia: Secondary | ICD-10-CM | POA: Diagnosis present

## 2021-10-07 LAB — URINALYSIS, ROUTINE W REFLEX MICROSCOPIC
Bilirubin Urine: NEGATIVE
Glucose, UA: NEGATIVE mg/dL
Hgb urine dipstick: NEGATIVE
Ketones, ur: NEGATIVE mg/dL
Leukocytes,Ua: NEGATIVE
Nitrite: NEGATIVE
Protein, ur: NEGATIVE mg/dL
Specific Gravity, Urine: 1.015 (ref 1.005–1.030)
pH: 7 (ref 5.0–8.0)

## 2021-10-07 LAB — CBC
HCT: 21.9 % — ABNORMAL LOW (ref 39.0–52.0)
Hemoglobin: 7.2 g/dL — ABNORMAL LOW (ref 13.0–17.0)
MCH: 33.3 pg (ref 26.0–34.0)
MCHC: 32.9 g/dL (ref 30.0–36.0)
MCV: 101.4 fL — ABNORMAL HIGH (ref 80.0–100.0)
Platelets: 265 10*3/uL (ref 150–400)
RBC: 2.16 MIL/uL — ABNORMAL LOW (ref 4.22–5.81)
RDW: 17.4 % — ABNORMAL HIGH (ref 11.5–15.5)
WBC: 13.7 10*3/uL — ABNORMAL HIGH (ref 4.0–10.5)
nRBC: 0.2 % (ref 0.0–0.2)

## 2021-10-07 LAB — CBC WITH DIFFERENTIAL/PLATELET
Basophils Absolute: 0.1 10*3/uL (ref 0.0–0.1)
Basophils Relative: 0.7 % (ref 0.0–3.0)
Eosinophils Absolute: 0.4 10*3/uL (ref 0.0–0.7)
Eosinophils Relative: 2.9 % (ref 0.0–5.0)
HCT: 21.9 % — CL (ref 39.0–52.0)
Hemoglobin: 7.4 g/dL — CL (ref 13.0–17.0)
Lymphocytes Relative: 16.4 % (ref 12.0–46.0)
Lymphs Abs: 2.3 10*3/uL (ref 0.7–4.0)
MCHC: 33.9 g/dL (ref 30.0–36.0)
MCV: 97.9 fl (ref 78.0–100.0)
Monocytes Absolute: 0.8 10*3/uL (ref 0.1–1.0)
Monocytes Relative: 6.2 % (ref 3.0–12.0)
Neutro Abs: 10.1 10*3/uL — ABNORMAL HIGH (ref 1.4–7.7)
Neutrophils Relative %: 73.8 % (ref 43.0–77.0)
Platelets: 268 10*3/uL (ref 150.0–400.0)
RBC: 2.23 Mil/uL — ABNORMAL LOW (ref 4.22–5.81)
RDW: 16.5 % — ABNORMAL HIGH (ref 11.5–15.5)
WBC: 13.7 10*3/uL — ABNORMAL HIGH (ref 4.0–10.5)

## 2021-10-07 LAB — RESP PANEL BY RT-PCR (FLU A&B, COVID) ARPGX2
Influenza A by PCR: NEGATIVE
Influenza B by PCR: NEGATIVE
SARS Coronavirus 2 by RT PCR: NEGATIVE

## 2021-10-07 LAB — HEPATIC FUNCTION PANEL
ALT: 15 U/L (ref 0–44)
AST: 21 U/L (ref 15–41)
Albumin: 3.5 g/dL (ref 3.5–5.0)
Alkaline Phosphatase: 31 U/L — ABNORMAL LOW (ref 38–126)
Bilirubin, Direct: <0.1 mg/dL (ref 0.0–0.2)
Total Bilirubin: 0.5 mg/dL (ref 0.3–1.2)
Total Protein: 6.1 g/dL — ABNORMAL LOW (ref 6.5–8.1)

## 2021-10-07 LAB — BASIC METABOLIC PANEL
Anion gap: 8 (ref 5–15)
BUN: 23 mg/dL (ref 8–23)
CO2: 24 mmol/L (ref 22–32)
Calcium: 8.5 mg/dL — ABNORMAL LOW (ref 8.9–10.3)
Chloride: 106 mmol/L (ref 98–111)
Creatinine, Ser: 1.6 mg/dL — ABNORMAL HIGH (ref 0.61–1.24)
GFR, Estimated: 43 mL/min — ABNORMAL LOW (ref >60)
Glucose, Bld: 100 mg/dL — ABNORMAL HIGH (ref 70–99)
Potassium: 4.8 mmol/L (ref 3.5–5.1)
Sodium: 138 mmol/L (ref 135–145)

## 2021-10-07 LAB — HEMOGLOBIN AND HEMATOCRIT, BLOOD
HCT: 22.7 % — ABNORMAL LOW (ref 39.0–52.0)
Hemoglobin: 7.4 g/dL — ABNORMAL LOW (ref 13.0–17.0)

## 2021-10-07 LAB — PREPARE RBC (CROSSMATCH)

## 2021-10-07 MED ORDER — SODIUM CHLORIDE 0.9 % IV SOLN: INTRAVENOUS | Status: DC

## 2021-10-07 MED ORDER — PANTOPRAZOLE INFUSION (NEW) - SIMPLE MED
8.0000 mg/h | INTRAVENOUS | Status: DC
  Administered 2021-10-07 – 2021-10-08 (×3): 8 mg/h via INTRAVENOUS
  Filled 2021-10-07: qty 100
  Filled 2021-10-07: qty 80
  Filled 2021-10-07: qty 100

## 2021-10-07 MED ORDER — PANTOPRAZOLE 80MG IVPB - SIMPLE MED
80.0000 mg | Freq: Once | INTRAVENOUS | Status: AC
  Administered 2021-10-07: 80 mg via INTRAVENOUS
  Filled 2021-10-07 (×2): qty 100

## 2021-10-07 MED ORDER — PRAMIPEXOLE DIHYDROCHLORIDE 1 MG PO TABS
1.0000 mg | ORAL_TABLET | Freq: Every day | ORAL | Status: DC
  Filled 2021-10-07: qty 1

## 2021-10-07 MED ORDER — ONDANSETRON HCL 4 MG PO TABS: 4.0000 mg | ORAL_TABLET | Freq: Four times a day (QID) | ORAL | Status: DC | PRN

## 2021-10-07 MED ORDER — TRAZODONE HCL 50 MG PO TABS
50.0000 mg | ORAL_TABLET | Freq: Every evening | ORAL | Status: DC | PRN
  Administered 2021-10-09: 100 mg via ORAL
  Filled 2021-10-07: qty 2

## 2021-10-07 MED ORDER — HYDROXYZINE HCL 10 MG PO TABS
10.0000 mg | ORAL_TABLET | Freq: Three times a day (TID) | ORAL | Status: DC | PRN
  Administered 2021-10-07 – 2021-10-09 (×3): 10 mg via ORAL
  Filled 2021-10-07 (×5): qty 1

## 2021-10-07 MED ORDER — TAMSULOSIN HCL 0.4 MG PO CAPS
0.4000 mg | ORAL_CAPSULE | Freq: Every day | ORAL | Status: DC
  Administered 2021-10-07 – 2021-10-10 (×4): 0.4 mg via ORAL
  Filled 2021-10-07 (×4): qty 1

## 2021-10-07 MED ORDER — ONDANSETRON HCL 4 MG/2ML IJ SOLN: 4.0000 mg | Freq: Four times a day (QID) | INTRAMUSCULAR | Status: DC | PRN

## 2021-10-07 MED ORDER — SODIUM CHLORIDE 0.9 % IV SOLN: 10.0000 mL/h | Freq: Once | INTRAVENOUS | Status: DC

## 2021-10-07 MED ORDER — PANTOPRAZOLE SODIUM 40 MG IV SOLR: 40.0000 mg | Freq: Two times a day (BID) | INTRAVENOUS | Status: DC

## 2021-10-07 NOTE — ED Notes (Signed)
Informed RN bed assigned 

## 2021-10-07 NOTE — Telephone Encounter (Signed)
Pt  wife called stating that pt is in the hospital. Pt wife states that they are very thankful for Dr Damita Dunnings, she stated that they don't know what they would of done if Dr Damita Dunnings wouldn't have had that conference with them.

## 2021-10-07 NOTE — Telephone Encounter (Signed)
[facility] lab called critical results, HGB - 7.4,HCT - 21.9. Results given to Dr Damita Dunnings.

## 2021-10-07 NOTE — ED Triage Notes (Signed)
Patient had been feeling fatigued/weak and sob with little exertion. PCP drew labs and sent patient to ER after results of low hemoglobin. Has been taking eliquis X2 weeks.

## 2021-10-07 NOTE — Plan of Care (Signed)
Patient oriented to room and call bell use upon admission. Patient's family at bedside.

## 2021-10-07 NOTE — Telephone Encounter (Signed)
Called patient and advised patient to go to ER for eval and repeat labs and to stop taking aspirin and eliquis until told otherwise. Patient verbalized understanding and stated he will go to ER today.

## 2021-10-07 NOTE — Consult Note (Signed)
GI Inpatient Consult Note  Reason for Consult: Melena, Heme positive stool, Symptomatic anemia   Attending Requesting Consult: Dr. Collier Bullock, MD  History of Present Illness: Ryan Pittman is a 81 y.o. male seen for evaluation of melena, heme positive stool, and symptomatic anemia at the request of admitting hospitalist - Dr. Collier Bullock. Pt has a PMH of PMH of HTN, GERD, CKD Stage III, hypothyroidism, CAD s/p CABG x5 07/2018, PAD s/p left femoropopliteal bypass, paroxsymal atrial fibrillation recently switched from Plavix to Eliquis, and arthritis. He presented to the [facility] ED via home this afternoon at the request of his PCP (Dr. Elsie Stain) for low hemoglobin 7.4 found on routine labs yesterday. He saw his PCP yesterday due to complaints of progressive shortness of breath and weakness over the past week. He reports he started to notice black stools starting 3-4 days ago. Upon presentation to the ED, he was found to have hemoglobin 7.2 which was decreased from baseline of 13.5 three weeks ago. His MCV was normocytic. He had mild leukocytosis 13.7K which has been chronically elevated. His serum creatinine 1.62 is at baseline. He has been ordered to have 1 unit pRBCs and this has just been started at time of our encounter this afternoon. He reports a history of GIB about 20 years ago and believes this was secondary to NSAID use He denies any nausea, vomiting, refractory GERD symptoms, esophageal dysphagia, odynophagia, early satiety, hoarseness, or epigastric abdominal pain. He has noticed that stools have been harder to pass over the past week. He has not seen any frank bright red blood in his stools. He denies any abdominal pain or abdominal cramping. His appetite and diet have been stable without any unintentional weight loss. He saw his cardiologist 09/16/21 and there was discussion on risks and benefits of beginning Eliquis. Eliquis was started three weeks ago and Plavix d/c. He was  also started on baby aspirin. Last colonoscopy performed by Dr. Gustavo Lah 04/2015 showed pandiverticulosis and eight subcentimeter colon polyps removed with tubular adenomas x3, hyperplastic polyp, benign lymphoid aggregate, and colonic mucosa without pathologic change. His last dose of Eliquis was this morning.    Past Medical History:  Past Medical History:  Diagnosis Date   Anemia    due to GIB, s/p transfusion   Anginal pain ([place]) 07/10/2018   Arthritis    back pain, much worse after consecutive golf rounds   Cancer [facility])    thyroid   Chronic kidney disease    Colon polyps    Coronary artery disease    COVID-19    Dyspnea    walking up a hill   Dysrhythmia 2020   GERD (gastroesophageal reflux disease)    Hypercalcemia    h/o, resolved as of 2012, prev due to high amount of calcium intake   Hyperlipidemia    Hypertension    Hypothyroidism    IgG gammopathy    stable as of 2012 per [facility]   Pneumonia    as a child   PVD (peripheral vascular disease) ([place])    L leg bypass, R leg stented    Problem List: Patient Active Problem List   Diagnosis Date Noted   Acute blood loss anemia (ABLA) 10/07/2021   Other social stressor 01/02/2021   Murmur 01/02/2021   PAD (peripheral artery disease) ([place]) 02/13/2020   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   Acute respiratory failure with hypoxia and hypercapnia (HCC)    Idiopathic hypercalcemia 08/03/2019   Ankle  pain 07/02/2019   CAD (coronary artery disease) 08/08/2018   PAF (paroxysmal atrial fibrillation) ([place]) 08/06/2018   Hx of CABG 07/11/2018   Health care maintenance 04/04/2018   Atypical chest pain 04/04/2018   Hypothyroidism 03/30/2017   Advance care planning 03/30/2017   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   Renal artery stenosis ([place]) 07/04/2016   BCC (basal cell carcinoma), 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   Hyperglycemia 12/28/2015   Hyperlipidemia 12/28/2015   Hypothyroidism, 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   Thyroid  cancer ([place]) 11/12/2014   Shortness of breath 04/22/2014   Spasm 04/22/2014   RLS (restless legs syndrome) 11/16/2013   Insomnia 06/18/2013   Medicare annual wellness visit, subsequent 03/12/2012   HTN (hypertension) 08/14/2011   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   CKD (chronic kidney disease) stage 3, GFR 30-59 ml/min (HCC) 02/13/2011   OA (osteoarthritis) 02/13/2011   Carotid stenosis 02/13/2011   ED (erectile dysfunction) 02/13/2011   Duodenal ulcer 02/13/2011   Diverticulosis 02/13/2011   PVD (peripheral vascular disease) ([place]) 01/26/2011   S/P bypass graft of extremity 01/26/2011   Essential hypertension, benign 01/26/2011   Back pain 01/26/2011   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   Nocturia 05/12/2008   Urinary hesitancy 05/12/2008    Past Surgical History: Past Surgical History:  Procedure Laterality Date    dental implant left bottom-did have bleeding      ABDOMINAL AORTOGRAM W/LOWER EXTREMITY Bilateral 06/26/2019   Procedure: ABDOMINAL AORTOGRAM W/LOWER EXTREMITY;  Surgeon: [name], MD;  Location: [place] CV [facility];  Service: Cardiovascular;  Laterality: Bilateral;   ABDOMINAL AORTOGRAM W/LOWER EXTREMITY N/A 06/08/2020   Procedure: ABDOMINAL AORTOGRAM W/LOWER EXTREMITY;  Surgeon: Serafina Mitchell, MD;  Location: [place] CV LAB;  Service: Cardiovascular;  Laterality: N/A;   BYPASS GRAFT     L leg   CARDIAC CATHETERIZATION  06/24/2018   COLONOSCOPY WITH PROPOFOL N/A 05/04/2015   Procedure: COLONOSCOPY WITH PROPOFOL;  Surgeon: Lollie Sails, MD;  Location: [facility] ENDOSCOPY;  Service: Endoscopy;  Laterality: N/A;   CORONARY ARTERY BYPASS GRAFT N/A 07/11/2018   Procedure: CORONARY ARTERY BYPASS GRAFTING (CABG) x three, using left internal mammary artery and right leg greater saphenous vein harvested endoscopically;  Surgeon: Gaye Pollack, MD;  Location: [place] OR;  Service: Open Heart Surgery;  Laterality: N/A;   ENDARTERECTOMY FEMORAL Right  02/13/2020   Procedure: RIGHT ENDARTERECTOMY FEMORAL WITH BOVINE PATCH;  Surgeon: Serafina Mitchell, MD;  Location: [place];  Service: Vascular;  Laterality: Right;   LEFT HEART CATH AND CORONARY ANGIOGRAPHY N/A 06/24/2018   Procedure: LEFT HEART CATH AND CORONARY ANGIOGRAPHY;  Surgeon: [name], MD;  Location: [place] CV LAB;  Service: Cardiovascular;  Laterality: N/A;   PERIPHERAL VASCULAR ATHERECTOMY Right 06/08/2020   Procedure: PERIPHERAL VASCULAR ATHERECTOMY;  Surgeon: Serafina Mitchell, MD;  Location: [place] CV LAB;  Service: Cardiovascular;  Laterality: Right;  superficial femoral   PERIPHERAL VASCULAR BALLOON ANGIOPLASTY Right 06/08/2020   Procedure: PERIPHERAL VASCULAR BALLOON ANGIOPLASTY;  Surgeon: Serafina Mitchell, MD;  Location: [place] CV LAB;  Service: Cardiovascular;  Laterality: Right;  External iliac   TEE WITHOUT CARDIOVERSION N/A 07/11/2018   Procedure: TRANSESOPHAGEAL ECHOCARDIOGRAM (TEE);  Surgeon: Gaye Pollack, MD;  Location: [place];  Service: Open Heart Surgery;  Laterality: N/A;   THYROIDECTOMY, PARTIAL  2016   VASCULAR SURGERY      Allergies: Allergies  Allergen Reactions   Penicillins Itching and Other (See Comments)    PATIENT HAS HAD A PCN REACTION WITH  IMMEDIATE RASH, FACIAL/TONGUE/THROAT SWELLING, SOB, OR LIGHTHEADEDNESS WITH HYPOTENSION:  #  #  YES  #  #  Has patient had a PCN reaction causing severe rash involving mucus membranes or skin necrosis: No Has patient had a PCN reaction that required hospitalization: No Has patient had a PCN reaction occurring within the last 10 years: No If all of the above answers are "NO", then may proceed with Cephalosporin use.    Ace Inhibitors Cough   Aspirin Other (See Comments)    H/o GI bleed   Celebrex [Celecoxib] Other (See Comments)    GI bleed   Lipitor [Atorvastatin] Other (See Comments)    myalgias    Home Medications: (Not in a hospital admission)  Home medication reconciliation was  completed with the patient.   Scheduled Inpatient Medications:    [START ON 10/11/2021] pantoprazole  40 mg Intravenous Q12H   pramipexole  1 mg Oral Daily   tamsulosin  0.4 mg Oral Daily    Continuous Inpatient Infusions:    sodium chloride     sodium chloride     pantoprazole 8 mg/hr (10/07/21 1657)    PRN Inpatient Medications:  hydrOXYzine, ondansetron **OR** ondansetron (ZOFRAN) IV, traZODone  Family History: family history includes Diabetes in his mother; Stroke in his father.  The patient's family history is negative for inflammatory bowel disorders, GI malignancy, or solid organ transplantation.  Social History:   reports that he quit smoking about 13 years ago. His smoking use included cigarettes. He has quit using smokeless tobacco.  His smokeless tobacco use included chew. He reports current alcohol use. He reports that he does not use drugs. The patient denies ETOH, tobacco, or drug use.   Review of Systems: Constitutional: Weight is stable.  Eyes: No changes in vision. ENT: No oral lesions, sore throat.  GI: see HPI.  Heme/Lymph: No easy bruising.  CV: No chest pain.  GU: No hematuria.  Integumentary: No rashes.  Neuro: No headaches.  Psych: No depression/anxiety.  Endocrine: No heat/cold intolerance.  Allergic/Immunologic: No urticaria.  Resp: No cough, SOB.  Musculoskeletal: No joint swelling.    Physical Examination: BP (!) 139/45    Pulse 72    Temp 98.3 F (36.8 C) (Oral)    Resp 17    Ht 5\' 7"  (1.702 m)    Wt 72.6 kg    SpO2 100%    BMI 25.06 kg/m  Gen: NAD, alert and oriented x 4 HEENT: PEERLA, EOMI, Neck: supple, no JVD or thyromegaly Chest: CTA bilaterally, no wheezes, crackles, or other adventitious sounds CV: RRR, no m/g/c/r Abd: soft, NT, ND, +BS in all four quadrants; no HSM, guarding, ridigity, or rebound tenderness Ext: no edema, well perfused with 2+ pulses, Skin: no rash or lesions noted Lymph: no LAD  Data: Lab Results  Component  Value Date   WBC 13.7 (H) 10/07/2021   HGB 7.2 (L) 10/07/2021   HCT 21.9 (L) 10/07/2021   MCV 101.4 (H) 10/07/2021   PLT 265 10/07/2021   Recent Labs  Lab 10/06/21 1556 10/07/21 1208  HGB 7.4 Repeated and verified X2.* 7.2*   Lab Results  Component Value Date   NA 138 10/07/2021   K 4.8 10/07/2021   CL 106 10/07/2021   CO2 24 10/07/2021   BUN 23 10/07/2021   CREATININE 1.60 (H) 10/07/2021   Lab Results  Component Value Date   ALT 15 10/07/2021   AST 21 10/07/2021   ALKPHOS 31 (L) 10/07/2021   BILITOT 0.5  10/07/2021   No results for input(s): APTT, INR, PTT in the last 168 hours.  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 No active hemorrhaging. He is getting 1 unit pRBCs this afternoon and started on Protonix gtt. DDx includes peptic ulcer disease, gastritis +/- H pylori, AVMs, erosive esophagitis, duodenitis, occult GI neoplasm, Dieulafoy's lesion, less likely lower GI source.   Eliquis use - recently switched from Plavix to Eliquis three weeks ago. Last dose of Eliquis this morning.   Paroxsymal atrial fibrillation - Eliquis now on hold  Hx of colon polyps - last CSY 2016  Recommendations:  - Agree with 1 unit pRBCs and check post-transfusion hemoglobin 2 hours after - Continue to monitor serial H&H.Transfuse for hgb <7.0.  - Continue Protonix for gastric protection - Continue to hold Eliquis and all antiplatelet therapy - Continue to monitor for signs of gastrointestinal bleeding  - EGD/colonoscopy likely on Sunday after Eliquis has been held for 48  hours - Following along with you - Full liquid diet for now  Thank you for the consult. Please call with questions or concerns.  Reeves Forth [facility] [phone] (Cell)

## 2021-10-07 NOTE — Telephone Encounter (Signed)
Please call pt.  Hold eliquis and aspirin.  I would go to ER for recheck labs and eval.  HGB is much lower than prev.

## 2021-10-07 NOTE — ED Provider Notes (Signed)
[facility] Provider Note    Event Date/Time   First MD Initiated Contact with Patient 10/07/21 1536     (approximate)   History   Fatigue and Shortness of Breath   HPI  Ryan Pittman is a 81 y.o. male with CKD, coronary disease, peripheral vascular disease, recently started on Eliquis due to concern for A-fib who now comes in with shortness of breath and fatigue.  Patient reports that he has had black stool for the past few days.  He then developed some worsening shortness of breath and fatigue as well.  He denies this ever happening previously.  Denies any abdominal pain.  He does report some occasional alcohol drinking but denies any history of cirrhosis or daily drinking.   Physical Exam   Triage Vital Signs: ED Triage Vitals  Enc Vitals Group     BP 10/07/21 1201 (!) 150/43     Pulse Rate 10/07/21 1201 74     Resp 10/07/21 1201 18     Temp 10/07/21 1201 98.7 F (37.1 C)     Temp Source 10/07/21 1201 Oral     SpO2 10/07/21 1201 99 %     Weight 10/07/21 1204 160 lb (72.6 kg)     Height 10/07/21 1204 5\' 7"  (1.702 m)     Head Circumference --      Peak Flow --      Pain Score 10/07/21 1204 0     Pain Loc --      Pain Edu? --      Excl. in [place]? --     Most recent vital signs: Vitals:   10/07/21 1624 10/07/21 1625  BP: (!) 148/50   Pulse:  71  Resp:    Temp:    SpO2:  99%     General: Awake, no distress.  CV:  Good peripheral perfusion.  Resp:  Normal effort.  Abd:  No distention.  Non tender  Other:  Brown stool but Hemoccult positive   ED Results / Procedures / Treatments   Labs (all labs ordered are listed, but only abnormal results are displayed) Labs Reviewed  BASIC METABOLIC PANEL - Abnormal; Notable for the following components:      Result Value   Glucose, Bld 100 (*)    Creatinine, Ser 1.60 (*)    Calcium 8.5 (*)    GFR, Estimated 43 (*)    All other components within normal limits  CBC - Abnormal; Notable for  the following components:   WBC 13.7 (*)    RBC 2.16 (*)    Hemoglobin 7.2 (*)    HCT 21.9 (*)    MCV 101.4 (*)    RDW 17.4 (*)    All other components within normal limits  HEPATIC FUNCTION PANEL - Abnormal; Notable for the following components:   Total Protein 6.1 (*)    Alkaline Phosphatase 31 (*)    All other components within normal limits  RESP PANEL BY RT-PCR (FLU A&B, COVID) ARPGX2  URINALYSIS, ROUTINE W REFLEX MICROSCOPIC  HEMOGLOBIN AND HEMATOCRIT, BLOOD  HEMOGLOBIN AND HEMATOCRIT, BLOOD  TYPE AND SCREEN  PREPARE RBC (CROSSMATCH)     EKG  My interpretation of EKG:  Normal sinus rate of 69 without any ST elevation or T wave inversions, normal intervals  RADIOLOGY I have reviewed the xray personally and agree with radiology read   PROCEDURES:  Critical Care performed: Yes, see critical care procedure note(s)  .Critical Care Performed by: Marjean Donna  E, MD Authorized by: Vanessa [place], MD   Critical care provider statement:    Critical care time (minutes):  30   Critical care was necessary to treat or prevent imminent or life-threatening deterioration of the following conditions: gi bleed.   Critical care was time spent personally by me on the following activities:  Development of treatment plan with patient or surrogate, discussions with consultants, evaluation of patient's response to treatment, examination of patient, ordering and review of laboratory studies, ordering and review of radiographic studies, ordering and performing treatments and interventions, pulse oximetry, re-evaluation of patient's condition and review of old [place] ED: Medications  pantoprazole (PROTONIX) 80 mg /NS 100 mL IVPB (80 mg Intravenous New Bag/Given 10/07/21 1634)  pantoprozole (PROTONIX) 80 mg /NS 100 mL infusion (has no administration in time range)  pantoprazole (PROTONIX) injection 40 mg (has no administration in time range)  0.9 %  sodium chloride  infusion (has no administration in time range)  traZODone (DESYREL) tablet 50-100 mg (has no administration in time range)  hydrOXYzine (ATARAX) tablet 10 mg (has no administration in time range)  tamsulosin (FLOMAX) capsule 0.4 mg (has no administration in time range)  pramipexole (MIRAPEX) tablet 1 mg (has no administration in time range)  0.9 %  sodium chloride infusion (has no administration in time range)  ondansetron (ZOFRAN) tablet 4 mg (has no administration in time range)    Or  ondansetron (ZOFRAN) injection 4 mg (has no administration in time range)     IMPRESSION / MDM / [place] / ED COURSE  I reviewed the triage vital signs and the nursing notes.  Patient with multiple comorbidities including just recently be started on Eliquis who comes in for dark stools and fatigue and shortness of breath Differential diagnosis includes, but is not limited to,  GI bleed, anemia, COVID, flu  BMP shows elevated kidney function at baseline COVID, flu are negative Hepatic functions are normal unlikely any cirrhosis CBC shows hemoglobin down from 13.1-7.2.  Patient has chronically elevated white count UA no UTI.  Patient will be transfused 1 unit of blood.  Rectal exam does show Hemoccult positive so we will start her on PPI.  Will discuss with the hospital team for admission for GI bleed       FINAL CLINICAL IMPRESSION(S) / ED DIAGNOSES   Final diagnoses:  Gastrointestinal hemorrhage, unspecified gastrointestinal hemorrhage type  Symptomatic anemia     Rx / DC Orders   ED Discharge Orders     None        Note:  This document was prepared using Dragon voice recognition software and may include unintentional dictation errors.   Vanessa Ayden, MD 10/07/21 [phone]

## 2021-10-07 NOTE — H&P (Signed)
History and Physical    Patient: Ryan Pittman WPY:[id number] DOB: 04-24-1941 DOA: 10/07/2021 DOS: the patient was seen and examined on 10/07/2021 PCP: Tonia Ghent, MD  Patient coming from: Home  Chief Complaint:  Chief Complaint  Patient presents with   Fatigue   Shortness of Breath    HPI: Ryan Pittman is a 81 y.o. male with medical history significant of atrial fibrillation on anticoagulation therapy with Eliquis, coronary artery disease status post CABG, hypothyroidism, hypertension, peripheral arterial disease status post left femoropopliteal bypass, GERD and stage III chronic kidney disease who presents to the ER at the request of his primary care provider for evaluation of increased weakness, fatigue and exertional shortness of breath. Patient states that he was recently started on Eliquis and notes that over the last several days his stools have been black in color.  He denies any frank bleeding or hematemesis and denies NSAID use. He denies any falls or loss of consciousness. He denies having any chest pain, no nausea, no vomiting, no changes in his bowel habits, no headache, no fever, no chills, no palpitations, no diaphoresis, no focal deficit or blurred vision  Review of Systems: As mentioned in the history of present illness. All other systems reviewed and are negative. Past Medical History:  Diagnosis Date   Anemia    due to GIB, s/p transfusion   Anginal pain ([place]) 07/10/2018   Arthritis    back pain, much worse after consecutive golf rounds   Cancer [facility])    thyroid   Chronic kidney disease    Colon polyps    Coronary artery disease    COVID-19    Dyspnea    walking up a hill   Dysrhythmia 2020   GERD (gastroesophageal reflux disease)    Hypercalcemia    h/o, resolved as of 2012, prev due to high amount of calcium intake   Hyperlipidemia    Hypertension    Hypothyroidism    IgG gammopathy    stable as of 2012 per [facility]   Pneumonia    as a child    PVD (peripheral vascular disease) ([place])    L leg bypass, R leg stented   Past Surgical History:  Procedure Laterality Date    dental implant left bottom-did have bleeding      ABDOMINAL AORTOGRAM W/LOWER EXTREMITY Bilateral 06/26/2019   Procedure: ABDOMINAL AORTOGRAM W/LOWER EXTREMITY;  Surgeon: [name], MD;  Location: [place] CV [facility];  Service: Cardiovascular;  Laterality: Bilateral;   ABDOMINAL AORTOGRAM W/LOWER EXTREMITY N/A 06/08/2020   Procedure: ABDOMINAL AORTOGRAM W/LOWER EXTREMITY;  Surgeon: Serafina Mitchell, MD;  Location: [place] [facility];  Service: Cardiovascular;  Laterality: N/A;   BYPASS GRAFT     L leg   CARDIAC CATHETERIZATION  06/24/2018   COLONOSCOPY WITH PROPOFOL N/A 05/04/2015   Procedure: COLONOSCOPY WITH PROPOFOL;  Surgeon: Lollie Sails, MD;  Location: [facility] ENDOSCOPY;  Service: Endoscopy;  Laterality: N/A;   CORONARY ARTERY BYPASS GRAFT N/A 07/11/2018   Procedure: CORONARY ARTERY BYPASS GRAFTING (CABG) x three, using left internal mammary artery and right leg greater saphenous vein harvested endoscopically;  Surgeon: Gaye Pollack, MD;  Location: [place] OR;  Service: Open Heart Surgery;  Laterality: N/A;   ENDARTERECTOMY FEMORAL Right 02/13/2020   Procedure: RIGHT ENDARTERECTOMY FEMORAL WITH BOVINE PATCH;  Surgeon: Serafina Mitchell, MD;  Location: [place];  Service: Vascular;  Laterality: Right;   LEFT HEART CATH AND CORONARY ANGIOGRAPHY N/A 06/24/2018   Procedure:  LEFT HEART CATH AND CORONARY ANGIOGRAPHY;  Surgeon: [name], MD;  Location: [place] [facility];  Service: Cardiovascular;  Laterality: N/A;   PERIPHERAL VASCULAR ATHERECTOMY Right 06/08/2020   Procedure: PERIPHERAL VASCULAR ATHERECTOMY;  Surgeon: Serafina Mitchell, MD;  Location: [place] [facility];  Service: Cardiovascular;  Laterality: Right;  superficial femoral   PERIPHERAL VASCULAR BALLOON ANGIOPLASTY Right 06/08/2020   Procedure: PERIPHERAL VASCULAR BALLOON ANGIOPLASTY;  Surgeon:  Serafina Mitchell, MD;  Location: [place] [facility];  Service: Cardiovascular;  Laterality: Right;  External iliac   TEE WITHOUT CARDIOVERSION N/A 07/11/2018   Procedure: TRANSESOPHAGEAL ECHOCARDIOGRAM (TEE);  Surgeon: Gaye Pollack, MD;  Location: [place];  Service: Open Heart Surgery;  Laterality: N/A;   THYROIDECTOMY, PARTIAL  2016   VASCULAR SURGERY     Social History:  reports that he quit smoking about 13 years ago. His smoking use included cigarettes. He has quit using smokeless tobacco.  His smokeless tobacco use included chew. He reports current alcohol use. He reports that he does not use drugs.  Allergies  Allergen Reactions   Penicillins Itching and Other (See Comments)    PATIENT HAS HAD A PCN REACTION WITH IMMEDIATE RASH, FACIAL/TONGUE/THROAT SWELLING, SOB, OR LIGHTHEADEDNESS WITH HYPOTENSION:  #  #  YES  #  #  Has patient had a PCN reaction causing severe rash involving mucus membranes or skin necrosis: No Has patient had a PCN reaction that required hospitalization: No Has patient had a PCN reaction occurring within the last 10 years: No If all of the above answers are "NO", then may proceed with Cephalosporin use.    Ace Inhibitors Cough   Aspirin Other (See Comments)    H/o GI bleed   Celebrex [Celecoxib] Other (See Comments)    GI bleed   Lipitor [Atorvastatin] Other (See Comments)    myalgias    Family History  Problem Relation Age of Onset   Diabetes Mother    Stroke Father    Colon cancer Neg Hx    Prostate cancer Neg Hx     Prior to Admission medications   Medication Sig Start Date End Date Taking? Authorizing Provider  amLODipine (NORVASC) 10 MG tablet Take 0.5-1 tablets (5-10 mg total) by mouth daily. 10/04/21 01/02/22  Tonia Ghent, MD  apixaban (ELIQUIS) 2.5 MG TABS tablet Take 1 tablet (2.5 mg total) by mouth 2 (two) times daily. 09/16/21   [name], MD  aspirin EC 81 MG tablet Take 81 mg by mouth daily. Swallow whole.    [provider]  hydrOXYzine (ATARAX) 10 MG tablet Take 10 mg by mouth 3 (three) times daily as needed.    [provider]  levothyroxine (SYNTHROID) 75 MCG tablet TAKE 1 TABLET BY MOUTH DAILY EXCEPT ON THURSDAYS TAKE 1.5 TABLETS 09/29/21   Tonia Ghent, MD  losartan (COZAAR) 100 MG tablet Take 1 tablet (100 mg total) by mouth daily. 09/09/21 12/08/21  Tonia Ghent, MD  metoprolol tartrate (LOPRESSOR) 25 MG tablet Take 0.5 tablets (12.5 mg total) by mouth 2 (two) times daily. 08/26/21   Lendon Colonel, NP  omeprazole (PRILOSEC) 20 MG capsule Take 1 capsule (20 mg total) by mouth daily. 08/26/21   Lendon Colonel, NP  pramipexole (MIRAPEX) 1 MG tablet Take 1 tablet (1 mg total) by mouth daily. 12/30/20   Tonia Ghent, MD  REPATHA SURECLICK 707 MG/ML SOAJ INJECT 140MG  EVERY 14 DAYS 07/25/21   [name], MD  sulfamethoxazole-trimethoprim (BACTRIM) 400-80 MG tablet Take 1 tablet by mouth 3 (three) times a week. 08/23/21   [provider]  tamsulosin (FLOMAX) 0.4 MG CAPS capsule Take 1 capsule (0.4 mg total) by mouth daily. 11/05/20   Stoioff, Ronda Fairly, MD  traZODone (DESYREL) 100 MG tablet Take 50-100 mg by mouth at bedtime as needed. 08/23/21   [provider]    Physical Exam: Vitals:   10/07/21 1201 10/07/21 1204 10/07/21 1451  BP: (!) 150/43  (!) 149/66  Pulse: 74  74  Resp: 18  18  Temp: 98.7 F (37.1 C)    TempSrc: Oral    SpO2: 99% 99% 99%  Weight:  72.6 kg   Height:  5\' 7"  (1.702 m)    Physical Exam Vitals and nursing note reviewed.  Constitutional:      Appearance: He is well-developed.  HENT:     Head: Normocephalic and atraumatic.     Mouth/Throat:     Mouth: Mucous membranes are moist.  Cardiovascular:     Rate and Rhythm: Normal rate and regular rhythm.  Pulmonary:     Effort: Pulmonary effort is normal.     Breath sounds: Normal breath sounds.  Abdominal:     General: Bowel sounds are normal.     Palpations: Abdomen is soft.   Genitourinary:    Rectum: Guaiac result positive.  Musculoskeletal:     Cervical back: Normal range of motion and neck supple.  Skin:    General: Skin is warm and dry.  Neurological:     General: No focal deficit present.     Mental Status: He is alert.  Psychiatric:        Mood and Affect: Mood normal.        Behavior: Behavior normal.     Data Reviewed: Labs reviewed Patient noted to have a drop in his hemoglobin from 13.5-7.4 Has leukocytosis of 13.7 which may be related to acute blood loss anemia Serum creatinine is 1.62 which is at baseline Respiratory viral panel is negative EKG reviewed by me shows normal sinus rhythm with nonspecific ST and T wave changes  There are no new results to review at this time.  Assessment and Plan: Principal Problem:   Acute blood loss anemia (ABLA) Active Problems:   Essential hypertension, benign   CKD (chronic kidney disease) stage 3, GFR 30-59 ml/min (HCC)   BPH with obstruction/lower urinary tract symptoms   Hypothyroidism, postsurgical   PAF (paroxysmal atrial fibrillation) (HCC)   Acute respiratory failure with hypoxia and hypercapnia (HCC)   Acute blood loss anemia From gastrointestinal source Patient has had several days of melena stool Noted to have a drop in his hemoglobin from 13.5g/dl to 7.4g/dl We will hold aspirin and Eliquis Transfuse 1 unit of packed RBC Continue Protonix drip initiated in the ER Consult gastroenterology     Hypertension Hold all antihypertensive medications for now     Paroxysmal atrial fibrillation Hold metoprolol for now Hold Eliquis due to acute blood loss anemia    History of BPH Continue Flomax    Hypothyroidism Continue Synthroid      Advance Care Planning:   Code Status: Full Code   Consults: Gastroenterology  Family Communication: Greater than 50% of time was spent discussing patient's condition and plan of care with him and his wife at the bedside.  All  questions and concerns have been addressed.  They verbalized understanding and agree with the plan.  Severity of Illness: The appropriate patient status  for this patient is INPATIENT. Inpatient status is judged to be reasonable and necessary in order to provide the required intensity of service to ensure the patient's safety. The patient's presenting symptoms, physical exam findings, and initial radiographic and laboratory data in the context of their chronic comorbidities is felt to place them at high risk for further clinical deterioration. Furthermore, it is not anticipated that the patient will be medically stable for discharge from the hospital within 2 midnights of admission.   * I certify that at the point of admission it is my clinical judgment that the patient will require inpatient hospital care spanning beyond 2 midnights from the point of admission due to high intensity of service, high risk for further deterioration and high frequency of surveillance required.*  Author: Collier Bullock, MD 10/07/2021 4:13 PM  For on call review [email or web address].

## 2021-10-08 DIAGNOSIS — D62 Acute posthemorrhagic anemia: Secondary | ICD-10-CM | POA: Diagnosis not present

## 2021-10-08 LAB — BASIC METABOLIC PANEL
Anion gap: 7 (ref 5–15)
BUN: 20 mg/dL (ref 8–23)
CO2: 22 mmol/L (ref 22–32)
Calcium: 8.3 mg/dL — ABNORMAL LOW (ref 8.9–10.3)
Chloride: 110 mmol/L (ref 98–111)
Creatinine, Ser: 1.33 mg/dL — ABNORMAL HIGH (ref 0.61–1.24)
GFR, Estimated: 53 mL/min — ABNORMAL LOW (ref >60)
Glucose, Bld: 81 mg/dL (ref 70–99)
Potassium: 4.3 mmol/L (ref 3.5–5.1)
Sodium: 139 mmol/L (ref 135–145)

## 2021-10-08 LAB — PROTIME-INR
INR: 1.1 (ref 0.8–1.2)
Prothrombin Time: 13.7 seconds (ref 11.4–15.2)

## 2021-10-08 LAB — FECAL OCCULT BLOOD, IMMUNOCHEMICAL

## 2021-10-08 LAB — SPECIMEN STATUS REPORT

## 2021-10-08 LAB — HEMOGLOBIN AND HEMATOCRIT, BLOOD
HCT: 22.5 % — ABNORMAL LOW (ref 39.0–52.0)
Hemoglobin: 7.3 g/dL — ABNORMAL LOW (ref 13.0–17.0)

## 2021-10-08 LAB — HEMOGLOBIN: Hemoglobin: 8.6 g/dL — ABNORMAL LOW (ref 13.0–17.0)

## 2021-10-08 MED ORDER — METOPROLOL TARTRATE 25 MG PO TABS
12.5000 mg | ORAL_TABLET | Freq: Two times a day (BID) | ORAL | Status: DC
  Administered 2021-10-08 – 2021-10-10 (×4): 12.5 mg via ORAL
  Filled 2021-10-08 (×4): qty 1

## 2021-10-08 MED ORDER — SODIUM CHLORIDE 0.9% IV SOLUTION: Freq: Once | INTRAVENOUS | Status: AC

## 2021-10-08 MED ORDER — PEG 3350-KCL-NA BICARB-NACL 420 G PO SOLR
4000.0000 mL | Freq: Once | ORAL | Status: AC
  Administered 2021-10-08: 4000 mL via ORAL
  Filled 2021-10-08: qty 4000

## 2021-10-08 MED ORDER — AMLODIPINE BESYLATE 5 MG PO TABS
5.0000 mg | ORAL_TABLET | Freq: Every day | ORAL | Status: DC
  Administered 2021-10-08 – 2021-10-10 (×3): 5 mg via ORAL
  Filled 2021-10-08 (×3): qty 1

## 2021-10-08 MED ORDER — AMLODIPINE BESYLATE 5 MG PO TABS: 5.0000 mg | ORAL_TABLET | Freq: Every day | ORAL | Status: DC

## 2021-10-08 MED ORDER — LEVOTHYROXINE SODIUM 50 MCG PO TABS
75.0000 ug | ORAL_TABLET | Freq: Every day | ORAL | Status: DC
  Administered 2021-10-10: 75 ug via ORAL
  Filled 2021-10-08: qty 1

## 2021-10-08 NOTE — Progress Notes (Signed)
°  Transition of Care [facility]) Screening Note   Patient Details  Name: Ryan Pittman Date of Birth: 1941-05-19   Transition of Care [facility]) CM/SW Contact:    Alberteen Sam, LCSW Phone Number: 10/08/2021, 10:19 AM    Transition of Care Department [facility]) has reviewed patient and no TOC needs have been identified at this time. We will continue to monitor patient advancement through interdisciplinary progression rounds. If new patient transition needs arise, please place a TOC consult.  [place], [place]

## 2021-10-08 NOTE — Plan of Care (Signed)
  Problem: Education: Goal: Knowledge of General Education information will improve Description Including pain rating scale, medication(s)/side effects and non-pharmacologic comfort measures Outcome: Progressing   Problem: Health Behavior/Discharge Planning: Goal: Ability to manage health-related needs will improve Outcome: Progressing   

## 2021-10-08 NOTE — Progress Notes (Signed)
GI Inpatient Follow-up Note  Subjective:  Patient seen in follow-up for symptomatic anemia, heme positive stool, and melena. No acute events overnight. He has not had any further bowel movements overnight or this morning. He did receive 1 unit pRBCs yesterday and repeat hemoglobin s/p transfusion was 7.3. He denies any new complaints. He denies any abdominal pain, nausea, vomiting, indigestion, or dysphagia. He is tolerating liquids just fine.   Scheduled Inpatient Medications:   [START ON 10/11/2021] pantoprazole  40 mg Intravenous Q12H   pramipexole  1 mg Oral Daily   tamsulosin  0.4 mg Oral Daily    Continuous Inpatient Infusions:    sodium chloride     sodium chloride 100 mL/hr at 10/08/21 0700   pantoprazole 8 mg/hr (10/08/21 0700)    PRN Inpatient Medications:  hydrOXYzine, ondansetron **OR** ondansetron (ZOFRAN) IV, traZODone  Review of Systems: Constitutional: Weight is stable.  Eyes: No changes in vision. ENT: No oral lesions, sore throat.  GI: see HPI.  Heme/Lymph: No easy bruising.  CV: No chest pain.  GU: No hematuria.  Integumentary: No rashes.  Neuro: No headaches.  Psych: No depression/anxiety.  Endocrine: No heat/cold intolerance.  Allergic/Immunologic: No urticaria.  Resp: No cough, SOB.  Musculoskeletal: No joint swelling.    Physical Examination: BP (!) 141/55 (BP Location: Left Arm)    Pulse 70    Temp 98.2 F (36.8 C) (Oral)    Resp 16    Ht 5\' 7"  (1.702 m)    Wt 72.6 kg    SpO2 100%    BMI 25.06 kg/m  Gen: NAD, alert and oriented x 4 HEENT: PEERLA, EOMI, Neck: supple, no JVD or thyromegaly Chest: CTA bilaterally, no wheezes, crackles, or other adventitious sounds CV: RRR, no m/g/c/r Abd: soft, NT, ND, +BS in all four quadrants; no HSM, guarding, ridigity, or rebound tenderness Ext: no edema, well perfused with 2+ pulses, Skin: no rash or lesions noted Lymph: no LAD  Data: Lab Results  Component Value Date   WBC 13.7 (H) 10/07/2021   HGB 7.3  (L) 10/08/2021   HCT 22.5 (L) 10/08/2021   MCV 101.4 (H) 10/07/2021   PLT 265 10/07/2021   Recent Labs  Lab 10/07/21 1208 10/07/21 2013 10/08/21 0358  HGB 7.2* 7.4* 7.3*   Lab Results  Component Value Date   NA 139 10/08/2021   K 4.3 10/08/2021   CL 110 10/08/2021   CO2 22 10/08/2021   BUN 20 10/08/2021   CREATININE 1.33 (H) 10/08/2021   Lab Results  Component Value Date   ALT 15 10/07/2021   AST 21 10/07/2021   ALKPHOS 31 (L) 10/07/2021   BILITOT 0.5 10/07/2021   No results for input(s): APTT, INR, PTT in the last 168 hours.  Assessment/Plan:  81 y/o Caucasian male with a PMH of HTN, GERD, CKD Stage III, hypothyroidism, CAD s/p CABG x5 07/2018, PAD s/p left femoropopliteal bypass, paroxsymal atrial fibrillation recently switched from Plavix to Eliquis, and arthritis presents to the [facility] ED from home for chief complaint of symptomatic anemia and melena    Symptomatic anemia/Melena/Heme positive stool - 6-gram drop in hemoglobin over the past 3 weeks with melanotic stool c/w GI bleed. No active hemorrhaging. He is s/p 1 unit pRBCs with post-transfusion hemoglobin 7.3. DDx includes peptic ulcer disease, gastritis +/- H pylori, AVMs, erosive esophagitis, duodenitis, occult GI neoplasm, Dieulafoy's lesion, less likely lower GI source.    Eliquis use - recently switched from Plavix to Eliquis three weeks ago. Last dose  of Eliquis yesterday morning     Paroxsymal atrial fibrillation - Eliquis now on hold   Hx of colon polyps - last CSY 2016  Recommendations:  - Transfuse 1 unit pRBCs today - Continue to monitor H&H closely. Transfuse to maintain hemoglobin >7.0.  - Continue Protonix for gastric protection - Continue to hold Eliquis and all antiplatelet therapy - Continue to monitor for signs of gastrointestinal bleeding. If evidence of active bleeding, please call Dr. Alice Reichert. - Recommend EGD + colonoscopy tomorrow with Dr. Alice Reichert for luminal evaluation and potential  endoscopic hemostasis. Discussed procedure details and indications in room today and patient and wife consent to proceed.  - Clear liquid diet today. NPO after midnight. Bowel prep orders in.  - See procedure notes for findings and further recommendations  I reviewed the risks (including bleeding, perforation, infection, anesthesia complications, cardiac/respiratory complications), benefits and alternatives of EGD and colonoscopy. Patient consents to proceed.    Please call with questions or concerns.   Octavia Bruckner, PA-C [facility] [phone] (Cell)

## 2021-10-08 NOTE — Progress Notes (Signed)
PROGRESS NOTE    Ryan Pittman  GMW:[id number] DOB: [birth year] DOA: 10/07/2021 PCP: Tonia Ghent, MD  Outpatient Specialists: vascular surgery, cardiology, urology, pulmonology, nephrology, endocrinology, hematology    Brief Narrative:   From admission h and p NEIL BRICKELL is a 81 y.o. male with medical history significant of atrial fibrillation on anticoagulation therapy with Eliquis, coronary artery disease status post CABG, hypothyroidism, hypertension, peripheral arterial disease status post left femoropopliteal bypass, GERD and stage III chronic kidney disease who presents to the ER at the request of his primary care provider for evaluation of increased weakness, fatigue and exertional shortness of breath. Patient states that he was recently started on Eliquis and notes that over the last several days his stools have been black in color.  He denies any frank bleeding or hematemesis and denies NSAID use. He denies any falls or loss of consciousness. He denies having any chest pain, no nausea, no vomiting, no changes in his bowel habits, no headache, no fever, no chills, no palpitations, no diaphoresis, no focal deficit or blurred vision     Assessment & Plan:   Principal Problem:   Acute blood loss anemia (ABLA) Active Problems:   Essential hypertension, benign   CKD (chronic kidney disease) stage 3, GFR 30-59 ml/min (HCC)   BPH with obstruction/lower urinary tract symptoms   Hypothyroidism, postsurgical   PAF (paroxysmal atrial fibrillation) (HCC)   Acute respiratory failure with hypoxia and hypercapnia (HCC)  # GI bleed Hgb dropped from 13s to 7s in the setting of recent (about 2 weeks ago) transition from plavix monotherapy to eliquis/aspirin. Symptomatic for a few days, with melena. UGIB thus most likely etiology. Hemodynamically stable, symptomatically improved after 1 unit PRBC. Hgb this morning 7.3 from 7.2 on presentation - will transfuse 1 unit this morning,  repeat hgb in the PM. Given CAD, transfusion goal is to maintain above 8 - pantop 40 iv bid - GI following, plan for endoscopy, possible colonoscopy tomorrow - holding aspirin/eliquis  # HTN Here bp elevated - resume home amlod and metop - hold home losart  # PAD # RAS Has plans for angiogram later this month. No lower extremity pain, extremities are warm  # CAD S/p 5-vessel cabg in 2019. No chest pain - monitor for ACS given new acute anemia - asa/eliquis on hold - not on a statin at home - cont home metop  # A fib HR controlled - cont home metop - eliquis on hold  # Hypothyroid - home synthroid  # CKD 3a Kidney function at baseline - monitor  # BPH - home flomax  DVT prophylaxis: SCDs Code Status: fu Family Communication: wife updated @ bedside 2/4  Level of care: Progressive Status is: Inpatient Remains inpatient appropriate because: severity of illness            Consultants:  GI  Procedures: none  Antimicrobials:  none    Subjective: SOB improved, no chest pain.  Objective: Vitals:   10/07/21 2051 10/08/21 0002 10/08/21 0515 10/08/21 0827  BP: (!) 138/45 (!) 119/43 (!) 141/55 (!) 160/55  Pulse: 71 73 70 74  Resp: 17 17 16 16   Temp: 98.4 F (36.9 C) 98 F (36.7 C) 98.2 F (36.8 C) 98.6 F (37 C)  TempSrc:  Oral Oral Oral  SpO2: 99% 99% 100% 99%  Weight:      Height:        Intake/Output Summary (Last 24 hours) at 10/08/2021 4403 Last data filed at 10/08/2021  4332 Gross per 24 hour  Intake 2621.4 ml  Output 1030 ml  Net 1591.4 ml   Filed Weights   10/07/21 1204  Weight: 72.6 kg    Examination:  General exam: Appears calm and comfortable  Respiratory system: Clear to auscultation. Respiratory effort normal. Cardiovascular system: moderate systolic murmur Gastrointestinal system: Abdomen is nondistended, soft and nontender. No organomegaly or masses felt. Normal bowel sounds heard. Central nervous system: Alert and  oriented. No focal neurological deficits. Extremities: Symmetric 5 x 5 power. Skin: No rashes, lesions or ulcers Psychiatry: Judgement and insight appear normal. Mood & affect appropriate.     Data Reviewed: I have personally reviewed following labs and imaging studies  CBC: Recent Labs  Lab 10/06/21 1556 10/07/21 1208 10/07/21 2013 10/08/21 0358  WBC 13.7* 13.7*  --   --   NEUTROABS 10.1*  --   --   --   HGB 7.4 Repeated and verified X2.* 7.2* 7.4* 7.3*  HCT 21.9 Repeated and verified X2.* 21.9* 22.7* 22.5*  MCV 97.9 101.4*  --   --   PLT 268.0 265  --   --    Basic Metabolic Panel: Recent Labs  Lab 10/07/21 1208 10/08/21 0358  NA 138 139  K 4.8 4.3  CL 106 110  CO2 24 22  GLUCOSE 100* 81  BUN 23 20  CREATININE 1.60* 1.33*  CALCIUM 8.5* 8.3*   GFR: Estimated Creatinine Clearance: 40 mL/min (A) (by C-G formula based on SCr of 1.33 mg/dL (H)). Liver Function Tests: Recent Labs  Lab 10/07/21 1504  AST 21  ALT 15  ALKPHOS 31*  BILITOT 0.5  PROT 6.1*  ALBUMIN 3.5   No results for input(s): LIPASE, AMYLASE in the last 168 hours. No results for input(s): AMMONIA in the last 168 hours. Coagulation Profile: No results for input(s): INR, PROTIME in the last 168 hours. Cardiac Enzymes: No results for input(s): CKTOTAL, CKMB, CKMBINDEX, TROPONINI in the last 168 hours. BNP (last 3 results) No results for input(s): PROBNP in the last 8760 hours. HbA1C: No results for input(s): HGBA1C in the last 72 hours. CBG: No results for input(s): GLUCAP in the last 168 hours. Lipid Profile: No results for input(s): CHOL, HDL, LDLCALC, TRIG, CHOLHDL, LDLDIRECT in the last 72 hours. Thyroid Function Tests: No results for input(s): TSH, T4TOTAL, FREET4, T3FREE, THYROIDAB in the last 72 hours. Anemia Panel: No results for input(s): VITAMINB12, FOLATE, FERRITIN, TIBC, IRON, RETICCTPCT in the last 72 hours. Urine analysis:    Component Value Date/Time   COLORURINE YELLOW  10/07/2021 [place] 10/07/2021 1208   LABSPEC 1.015 10/07/2021 1208   PHURINE 7.0 10/07/2021 1208   GLUCOSEU NEGATIVE 10/07/2021 1208   HGBUR NEGATIVE 10/07/2021 [place] 10/07/2021 1208   KETONESUR NEGATIVE 10/07/2021 1208   PROTEINUR NEGATIVE 10/07/2021 1208   NITRITE NEGATIVE 10/07/2021 1208   LEUKOCYTESUR NEGATIVE 10/07/2021 1208   Sepsis Labs: @LABRCNTIP (procalcitonin:4,lacticidven:4)  ) Recent Results (from the past 240 hour(s))  Fecal occult blood, imunochemical     Status: None   Collection Time: 10/07/21 12:00 AM  Result Value Ref Range Status   Fecal Occult Bld CANCELED      Comment: Test not performed. No Immuno Polymedco OC sampling bottle received.  Result canceled by the ancillary.   Resp Panel by RT-PCR (Flu A&B, Covid) Nasopharyngeal Swab     Status: None   Collection Time: 10/07/21  3:05 PM   Specimen: Nasopharyngeal Swab; Nasopharyngeal(NP) swabs in vial transport  medium  Result Value Ref Range Status   SARS Coronavirus 2 by RT PCR NEGATIVE NEGATIVE Final    Comment: (NOTE) SARS-CoV-2 target nucleic acids are NOT DETECTED.  The SARS-CoV-2 RNA is generally detectable in upper respiratory specimens during the acute phase of infection. The lowest concentration of SARS-CoV-2 viral copies this assay can detect is 138 copies/mL. A negative result does not preclude SARS-Cov-2 infection and should not be used as the sole basis for treatment or other patient management decisions. A negative result may occur with  improper specimen collection/handling, submission of specimen other than nasopharyngeal swab, presence of viral mutation(s) within the areas targeted by this assay, and inadequate number of viral copies(<138 copies/mL). A negative result must be combined with clinical observations, patient history, and epidemiological information. The expected result is Negative.  Fact Sheet for Patients:   EntrepreneurPulse.com.au  Fact Sheet for Healthcare Providers:  IncredibleEmployment.be  This test is no t yet approved or cleared by the [place] FDA and  has been authorized for detection and/or diagnosis of SARS-CoV-2 by FDA under an Emergency Use Authorization (EUA). This EUA will remain  in effect (meaning this test can be used) for the duration of the COVID-19 declaration under Section 564(b)(1) of the Act, 21 U.S.C.section 360bbb-3(b)(1), unless the authorization is terminated  or revoked sooner.       Influenza A by PCR NEGATIVE NEGATIVE Final   Influenza B by PCR NEGATIVE NEGATIVE Final    Comment: (NOTE) The Xpert Xpress SARS-CoV-2/FLU/RSV plus assay is intended as an aid in the diagnosis of influenza from Nasopharyngeal swab specimens and should not be used as a sole basis for treatment. Nasal washings and aspirates are unacceptable for Xpert Xpress SARS-CoV-2/FLU/RSV testing.  Fact Sheet for Patients: EntrepreneurPulse.com.au  Fact Sheet for Healthcare Providers: IncredibleEmployment.be  This test is not yet approved or cleared by the [place] FDA and has been authorized for detection and/or diagnosis of SARS-CoV-2 by FDA under an Emergency Use Authorization (EUA). This EUA will remain in effect (meaning this test can be used) for the duration of the COVID-19 declaration under Section 564(b)(1) of the Act, 21 U.S.C. section 360bbb-3(b)(1), unless the authorization is terminated or revoked.  Performed at [facility], [address], [place], [place] [address]          Radiology Studies: No results found.      Scheduled Meds:  sodium chloride   Intravenous Once   amLODipine  5-10 mg Oral Daily   levothyroxine  75 mcg Oral Q0600   metoprolol tartrate  12.5 mg Oral BID   [START ON 10/11/2021] pantoprazole  40 mg Intravenous Q12H   polyethylene  glycol-electrolytes  4,000 mL Oral Once   tamsulosin  0.4 mg Oral Daily   Continuous Infusions:  sodium chloride     sodium chloride 100 mL/hr at 10/08/21 0700   pantoprazole 8 mg/hr (10/08/21 0914)     LOS: 1 day    Time spent: 39 min    Desma Maxim, MD [facility]   If 7PM-7AM, please contact night-coverage [email or web address] Password [facility] 10/08/2021, 9:39 AM

## 2021-10-09 ENCOUNTER — Encounter
Admission: EM | Disposition: A | Discharge status: Home / Self Care | Payer: Self-pay | Source: Home / Self Care | Attending: Obstetrics and Gynecology

## 2021-10-09 ENCOUNTER — Encounter: Payer: Self-pay | Admitting: Internal Medicine

## 2021-10-09 ENCOUNTER — Inpatient Hospital Stay: Payer: Medicare Other | Admitting: Certified Registered"

## 2021-10-09 DIAGNOSIS — D62 Acute posthemorrhagic anemia: Secondary | ICD-10-CM | POA: Diagnosis not present

## 2021-10-09 DIAGNOSIS — R195 Other fecal abnormalities: Secondary | ICD-10-CM | POA: Insufficient documentation

## 2021-10-09 HISTORY — PX: GIVENS CAPSULE STUDY: SHX5432

## 2021-10-09 HISTORY — PX: COLONOSCOPY: SHX5424

## 2021-10-09 HISTORY — PX: ESOPHAGOGASTRODUODENOSCOPY: SHX5428

## 2021-10-09 LAB — BASIC METABOLIC PANEL
Anion gap: 6 (ref 5–15)
BUN: 13 mg/dL (ref 8–23)
CO2: 23 mmol/L (ref 22–32)
Calcium: 8.9 mg/dL (ref 8.9–10.3)
Chloride: 110 mmol/L (ref 98–111)
Creatinine, Ser: 1.18 mg/dL (ref 0.61–1.24)
GFR, Estimated: >60 mL/min (ref >60)
Glucose, Bld: 89 mg/dL (ref 70–99)
Potassium: 4.1 mmol/L (ref 3.5–5.1)
Sodium: 139 mmol/L (ref 135–145)

## 2021-10-09 LAB — CBC
HCT: 27 % — ABNORMAL LOW (ref 39.0–52.0)
Hemoglobin: 9 g/dL — ABNORMAL LOW (ref 13.0–17.0)
MCH: 30.8 pg (ref 26.0–34.0)
MCHC: 33.3 g/dL (ref 30.0–36.0)
MCV: 92.5 fL (ref 80.0–100.0)
Platelets: 220 10*3/uL (ref 150–400)
RBC: 2.92 MIL/uL — ABNORMAL LOW (ref 4.22–5.81)
RDW: 23.2 % — ABNORMAL HIGH (ref 11.5–15.5)
WBC: 11.2 10*3/uL — ABNORMAL HIGH (ref 4.0–10.5)
nRBC: 0 % (ref 0.0–0.2)

## 2021-10-09 SURGERY — EGD (ESOPHAGOGASTRODUODENOSCOPY)
Anesthesia: General

## 2021-10-09 SURGERY — IMAGING PROCEDURE, GI TRACT, INTRALUMINAL, VIA CAPSULE
Anesthesia: General

## 2021-10-09 MED ORDER — PROPOFOL 500 MG/50ML IV EMUL
INTRAVENOUS | Status: AC
  Filled 2021-10-09: qty 50

## 2021-10-09 MED ORDER — LIDOCAINE HCL (CARDIAC) PF 100 MG/5ML IV SOSY
PREFILLED_SYRINGE | INTRAVENOUS | Status: DC | PRN
  Administered 2021-10-09: 80 mg via INTRAVENOUS

## 2021-10-09 MED ORDER — SODIUM CHLORIDE 0.9 % IV SOLN: INTRAVENOUS | Status: DC

## 2021-10-09 MED ORDER — PROPOFOL 10 MG/ML IV BOLUS
INTRAVENOUS | Status: DC | PRN
  Administered 2021-10-09 (×2): 40 mg via INTRAVENOUS
  Administered 2021-10-09: 80 mg via INTRAVENOUS
  Administered 2021-10-09 (×4): 40 mg via INTRAVENOUS

## 2021-10-09 NOTE — Anesthesia Preprocedure Evaluation (Signed)
Anesthesia Evaluation  Patient identified by MRN, date of birth, ID band Patient awake    Reviewed: Allergy & Precautions, H&P , NPO status , Patient's Chart, lab work & pertinent test results, reviewed documented beta blocker date and time   Airway Mallampati: II   Neck ROM: full    Dental  (+) Poor Dentition   Pulmonary shortness of breath and with exertion, pneumonia, resolved, former smoker,    Pulmonary exam normal        Cardiovascular Exercise Tolerance: Poor hypertension, On Medications + angina with exertion + CAD and + Peripheral Vascular Disease  Normal cardiovascular exam+ dysrhythmias + Valvular Problems/Murmurs  Rhythm:regular Rate:Normal  Echo noted. ja   Neuro/Psych negative neurological ROS  negative psych ROS   GI/Hepatic Neg liver ROS, PUD,   Endo/Other  Hypothyroidism   Renal/GU Renal disease  negative genitourinary   Musculoskeletal   Abdominal   Peds  Hematology  (+) Blood dyscrasia, anemia ,   Anesthesia Other Findings Past Medical History: No date: Anemia     Comment:  due to GIB, s/p transfusion 07/10/2018: Anginal pain ([place]) No date: Arthritis     Comment:  back pain, much worse after consecutive golf rounds No date: Cancer ([place])     Comment:  thyroid No date: Chronic kidney disease No date: Colon polyps No date: Coronary artery disease No date: COVID-19 No date: Dyspnea     Comment:  walking up a hill 2020: Dysrhythmia No date: GERD (gastroesophageal reflux disease) No date: Hypercalcemia     Comment:  h/o, resolved as of 2012, prev due to high amount of               calcium intake No date: Hyperlipidemia No date: Hypertension No date: Hypothyroidism 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 per [facility] No date: Pneumonia     Comment:  as a child No date: PVD (peripheral vascular disease) ([place])     Comment:  L leg bypass, R leg stented Past Surgical  History: No date:  dental implant left bottom-did have bleeding  06/26/2019: ABDOMINAL AORTOGRAM W/LOWER EXTREMITY; Bilateral     Comment:  Procedure: ABDOMINAL AORTOGRAM W/LOWER EXTREMITY;                Surgeon: [name], MD;  Location: [place] CV              LAB;  Service: Cardiovascular;  Laterality: Bilateral; 06/08/2020: ABDOMINAL AORTOGRAM W/LOWER EXTREMITY; N/A     Comment:  Procedure: ABDOMINAL AORTOGRAM W/LOWER EXTREMITY;                Surgeon: Serafina Mitchell, MD;  Location: [place] [facility]               LAB;  Service: Cardiovascular;  Laterality: N/A; No date: BYPASS GRAFT     Comment:  L leg 06/24/2018: CARDIAC CATHETERIZATION 05/04/2015: COLONOSCOPY WITH PROPOFOL; N/A     Comment:  Procedure: COLONOSCOPY WITH PROPOFOL;  Surgeon: Lollie Sails, MD;  Location: [facility] ENDOSCOPY;  Service:               Endoscopy;  Laterality: N/A; 07/11/2018: CORONARY ARTERY BYPASS GRAFT; N/A     Comment:  Procedure: CORONARY ARTERY BYPASS GRAFTING (CABG) x               three, using left internal mammary artery  and right leg               greater saphenous vein harvested endoscopically;                Surgeon: Gaye Pollack, MD;  Location: [place] OR;  Service:              Open Heart Surgery;  Laterality: N/A; 02/13/2020: ENDARTERECTOMY FEMORAL; Right     Comment:  Procedure: RIGHT ENDARTERECTOMY FEMORAL WITH BOVINE               PATCH;  Surgeon: Serafina Mitchell, MD;  Location: [place];               Service: Vascular;  Laterality: Right; 06/24/2018: LEFT HEART CATH AND CORONARY ANGIOGRAPHY; N/A     Comment:  Procedure: LEFT HEART CATH AND CORONARY ANGIOGRAPHY;                Surgeon: [name], MD;  Location: [place] [facility]              LAB;  Service: Cardiovascular;  Laterality: N/A; 06/08/2020: PERIPHERAL VASCULAR ATHERECTOMY; Right     Comment:  Procedure: PERIPHERAL VASCULAR ATHERECTOMY;  Surgeon:               Serafina Mitchell, MD;  Location: [place] [facility] LAB;                Service: Cardiovascular;  Laterality: Right;  superficial              femoral 06/08/2020: PERIPHERAL VASCULAR BALLOON ANGIOPLASTY; Right     Comment:  Procedure: PERIPHERAL VASCULAR BALLOON ANGIOPLASTY;                Surgeon: Serafina Mitchell, MD;  Location: [place] [facility]               LAB;  Service: Cardiovascular;  Laterality: Right;                External iliac 07/11/2018: TEE WITHOUT CARDIOVERSION; N/A     Comment:  Procedure: TRANSESOPHAGEAL ECHOCARDIOGRAM (TEE);                Surgeon: Gaye Pollack, MD;  Location: [place];  Service:              Open Heart Surgery;  Laterality: N/A; 2016: THYROIDECTOMY, PARTIAL No date: VASCULAR SURGERY BMI    Body Mass Index: 25.06 kg/m     Reproductive/Obstetrics negative OB ROS                             Anesthesia Physical Anesthesia Plan  ASA: 4 and emergent  Anesthesia Plan: General   Post-op Pain Management:    Induction:   PONV Risk Score and Plan:   Airway Management Planned:   Additional Equipment:   Intra-op Plan:   Post-operative Plan:   Informed Consent: I have reviewed the patients History and Physical, chart, labs and discussed the procedure including the risks, benefits and alternatives for the proposed anesthesia with the patient or authorized representative who has indicated his/her understanding and acceptance.     Dental Advisory Given  Plan Discussed with: CRNA  Anesthesia Plan Comments:         Anesthesia Quick Evaluation

## 2021-10-09 NOTE — Progress Notes (Signed)
Obtained consent from wife [name] to perform a capsule endoscopy today. Dr. Alice Reichert spoke with Mrs. Weathington over the telephone and then verified consent with this RN.  Capsule was initiated at 8768 without complications.  Instructions were given to patient, wife and his primary RN. This RN will return to pick up capsule equipment after 1930 and download recorder.

## 2021-10-09 NOTE — Assessment & Plan Note (Signed)
We talked about his situation.  The main historical feature over the last few days has been dark stools.  We need to exclude anemia or blood in his stool.  Rationale discussed with patient.  Reasonable not to change his medications at this point but check routine labs.  Rationale discussed with patient and still okay for outpatient follow-up.  See notes on labs.  32 minutes were devoted to patient care in this encounter (this includes time spent reviewing the patient's file/history, interviewing and examining the patient, counseling/reviewing plan with patient).

## 2021-10-09 NOTE — Progress Notes (Signed)
[facility] capsule equipment was retrieved from patient at [facility] and returned to the Endoscopy unit. Capsule recorder has been placed into the base for download. After about 15 minutes, this RN observed images on the screen.

## 2021-10-09 NOTE — Op Note (Signed)
[facility] Patient Name: Ryan Pittman Procedure Date: 10/09/2021 9:47 AM MRN: [id number] Account #: [id number] Date of Birth: 1941-03-23 Admit Type: Inpatient Age: 81 Room: [facility] ENDO ROOM 4 Gender: Male Note Status: Finalized Instrument Name: Upper Endoscope [id number] Procedure:             Upper GI endoscopy Indications:           Iron deficiency anemia secondary to chronic blood                         loss, Heme positive stool Providers:             Benay Pike. Alice Reichert MD, MD Referring MD:          Herminio Commons. Phillip Heal MD, MD (Referring MD) Medicines:             Propofol per Anesthesia Complications:         No immediate complications. Procedure:             Pre-Anesthesia Assessment:                        - The risks and benefits of the procedure and the                         sedation options and risks were discussed with the                         patient. All questions were answered and informed                         consent was obtained.                        - Patient identification and proposed procedure were                         verified prior to the procedure by the nurse. The                         procedure was verified in the procedure room.                        - ASA Grade Assessment: II - A patient with mild                         systemic disease.                        - After reviewing the risks and benefits, the patient                         was deemed in satisfactory condition to undergo the                         procedure.                        - The anesthesia plan was to use moderate  sedation/analgesia (conscious sedation).                        After obtaining informed consent, the endoscope was                         passed under direct vision. Throughout the procedure,                         the patient's blood pressure, pulse, and oxygen                         saturations were  monitored continuously. The                         Endosonoscope was introduced through the mouth, and                         advanced to the third part of duodenum. The upper GI                         endoscopy was accomplished without difficulty. The                         patient tolerated the procedure well. Findings:      The esophagus was normal.      Patchy mild inflammation characterized by erythema was found in the       gastric antrum.      The cardia and gastric fundus were normal on retroflexion.      The examined duodenum was normal.      The exam was otherwise without abnormality. Impression:            - Normal esophagus.                        - Gastritis.                        - Normal examined duodenum.                        - The examination was otherwise normal.                        - No specimens collected.                        - No upper GI source of occult or overt                         gastrointestinal bleeding identified. Recommendation:        - Proceed with colonoscopy Procedure Code(s):     --- Professional ---                        [phone], Esophagogastroduodenoscopy, flexible,                         transoral; diagnostic, including collection of                         specimen(s)  by brushing or washing, when performed                         (separate procedure) Diagnosis Code(s):     --- Professional ---                        R19.5, Other fecal abnormalities                        D50.0, Iron deficiency anemia secondary to blood loss                         (chronic)                        K29.70, Gastritis, unspecified, without bleeding CPT copyright 2019 American Medical Association. All rights reserved. The codes documented in this report are preliminary and upon coder review may  be revised to meet current compliance requirements. Efrain Sella MD, MD 10/09/2021 10:20:06 AM This report has been signed electronically. Number of  Addenda: 0 Note Initiated On: 10/09/2021 9:47 AM Estimated Blood Loss:  Estimated blood loss: none.      [facility]

## 2021-10-09 NOTE — Op Note (Signed)
[facility] Patient Name: Ryan Pittman Procedure Date: 10/09/2021 9:46 AM MRN: [id number] Account #: [id number] Date of Birth: Jun 12, 1941 Admit Type: Inpatient Age: 81 Room: [facility] ENDO ROOM 4 Gender: Male Note Status: Finalized Instrument Name: Jasper Riling [id number] Procedure:             Colonoscopy Indications:           Melena, Iron deficiency anemia secondary to chronic                         blood loss Providers:             Benay Pike. Alice Reichert MD, MD Referring MD:          Herminio Commons. Phillip Heal MD, MD (Referring MD) Medicines:             Propofol per Anesthesia Complications:         No immediate complications. Procedure:             Pre-Anesthesia Assessment:                        - The risks and benefits of the procedure and the                         sedation options and risks were discussed with the                         patient. All questions were answered and informed                         consent was obtained.                        - Patient identification and proposed procedure were                         verified prior to the procedure by the nurse. The                         procedure was verified in the procedure room.                        - ASA Grade Assessment: III - A patient with severe                         systemic disease.                        - After reviewing the risks and benefits, the patient                         was deemed in satisfactory condition to undergo the                         procedure.                        After obtaining informed consent, the colonoscope was                         passed under  direct vision. Throughout the procedure,                         the patient's blood pressure, pulse, and oxygen                         saturations were monitored continuously. The                         Colonoscope was introduced through the anus and                         advanced to the the cecum,  identified by appendiceal                         orifice and ileocecal valve. The colonoscopy was                         performed without difficulty. The patient tolerated                         the procedure well. The quality of the bowel                         preparation was adequate. The ileocecal valve,                         appendiceal orifice, and rectum were photographed. Findings:      The perianal and digital rectal examinations were normal. Pertinent       negatives include normal sphincter tone and no palpable rectal lesions.      Non-bleeding internal hemorrhoids were found during retroflexion. The       hemorrhoids were Grade I (internal hemorrhoids that do not prolapse).      Multiple small and large-mouthed diverticula were found in the sigmoid       colon. There was no evidence of diverticular bleeding.      Many small-mouthed diverticula were found in the descending colon,       transverse colon and ascending colon. There was no evidence of       diverticular bleeding.      The exam was otherwise without abnormality.      There is no endoscopic evidence of bleeding, inflammation or polyps in       the entire colon. Impression:            - Non-bleeding internal hemorrhoids.                        - Moderate diverticulosis in the sigmoid colon. There                         was no evidence of diverticular bleeding.                        - Mild diverticulosis in the descending colon, in the                         transverse colon and in the ascending colon. There was  no evidence of diverticular bleeding.                        - The examination was otherwise normal.                        - No specimens collected. Recommendation:        - Return patient to hospital ward for ongoing care.                        - To visualize the small bowel, perform video capsule                         endoscopy tomorrow.                        - Full  liquid diet.                        - NPO after midnight.                        - The findings and recommendations were discussed with                         the patient. Procedure Code(s):     --- Professional ---                        [phone], Colonoscopy, flexible; diagnostic, including                         collection of specimen(s) by brushing or washing, when                         performed (separate procedure) Diagnosis Code(s):     --- Professional ---                        K57.30, Diverticulosis of large intestine without                         perforation or abscess without bleeding                        D50.0, Iron deficiency anemia secondary to blood loss                         (chronic)                        K92.1, Melena (includes Hematochezia)                        K64.0, First degree hemorrhoids CPT copyright 2019 American [employer]. All rights reserved. The codes documented in this report are preliminary and upon coder review may  be revised to meet current compliance requirements. Efrain Sella MD, MD 10/09/2021 10:41:27 AM This report has been signed electronically. Number of Addenda: 0 Note Initiated On: 10/09/2021 9:46 AM Scope Withdrawal Time: 0 hours 5 minutes 36 seconds  Total Procedure Duration: 0 hours 8 minutes 45 seconds  Estimated Blood Loss:  Estimated  blood loss: none.      [facility]

## 2021-10-09 NOTE — Telephone Encounter (Signed)
Duly noted, please let her know that I am following along- I saw his inpatient notes and I'll await his discharge summary.  Appreciate the help of the inpatient team. Thanks.

## 2021-10-09 NOTE — Transfer of Care (Signed)
Immediate Anesthesia Transfer of Care Note  Patient: Ryan Pittman  Procedure(s) Performed: ESOPHAGOGASTRODUODENOSCOPY (EGD) COLONOSCOPY  Patient Location: Endoscopy Unit  Anesthesia Type:General  Level of Consciousness: drowsy  Airway & Oxygen Therapy: Patient Spontanous Breathing and Patient connected to nasal cannula oxygen  Post-op Assessment: Report given to RN and Post -op Vital signs reviewed and stable  Post vital signs: Reviewed and stable  Last Vitals:  Vitals Value Taken Time  BP    Temp    Pulse    Resp    SpO2      Last Pain:  Vitals:   10/09/21 0945  TempSrc: Temporal  PainSc: 0-No pain         Complications: No notable events documented.

## 2021-10-09 NOTE — Progress Notes (Signed)
PROGRESS NOTE    Ryan Pittman  XQJ:[id number] DOB: 05/19/1941 DOA: 10/07/2021 PCP: Tonia Ghent, MD  Outpatient Specialists: vascular surgery, cardiology, urology, pulmonology, nephrology, endocrinology, hematology    Brief Narrative:   From admission h and p Ryan Pittman is a 81 y.o. male with medical history significant of atrial fibrillation on anticoagulation therapy with Eliquis, coronary artery disease status post CABG, hypothyroidism, hypertension, peripheral arterial disease status post left femoropopliteal bypass, GERD and stage III chronic kidney disease who presents to the ER at the request of his primary care provider for evaluation of increased weakness, fatigue and exertional shortness of breath. Patient states that he was recently started on Eliquis and notes that over the last several days his stools have been black in color.  He denies any frank bleeding or hematemesis and denies NSAID use. He denies any falls or loss of consciousness. He denies having any chest pain, no nausea, no vomiting, no changes in his bowel habits, no headache, no fever, no chills, no palpitations, no diaphoresis, no focal deficit or blurred vision     Assessment & Plan:   Principal Problem:   Acute blood loss anemia (ABLA) Active Problems:   Essential hypertension, benign   CKD (chronic kidney disease) stage 3, GFR 30-59 ml/min (HCC)   BPH with obstruction/lower urinary tract symptoms   Hypothyroidism, postsurgical   PAF (paroxysmal atrial fibrillation) (HCC)   Acute respiratory failure with hypoxia and hypercapnia (HCC)  # GI bleed Hgb dropped from 13s to 7s in the setting of recent (about 2 weeks ago) transition from plavix monotherapy to eliquis/aspirin. Symptomatic for a few days, with melena. Has received 2 units PRBCs, on 2/3 and 2/4. Hgb this morning 9, from 7.2 on presentation. Upper and lower endoscopies w/ GI on 2/5 not showing source of bleed, just gastritis, internal  hemorrhoids, and diverticulosis. Capsule endoscopy started today.  - f/u results of capsule endoscopy - holding aspirin/eliquis  # HTN Here bp mildly elevated - resumed home amlod and metop - hold home losart  # PAD # RAS Has plans for angiogram later this month. No lower extremity pain, extremities are warm  # CAD S/p 5-vessel cabg in 2019. No chest pain - monitor for ACS given new acute anemia - asa/eliquis on hold - not on a statin at home - cont home metop  # A fib HR controlled - cont home metop - eliquis on hold  # Hypothyroid - home synthroid  # CKD 3a Kidney function at baseline - monitor  # BPH - home flomax  DVT prophylaxis: SCDs Code Status: fu Family Communication: wife and daughter updated @ bedside 2/5  Level of care: Progressive Status is: Inpatient Remains inpatient appropriate because: severity of illness            Consultants:  GI  Procedures: none  Antimicrobials:  none    Subjective: No melena or hematochezia. Tolerated endoscopies  Objective: Vitals:   10/09/21 0945 10/09/21 1035 10/09/21 1045 10/09/21 1151  BP: (!) 180/52  135/62 (!) 159/52  Pulse: 65   60  Resp: 16   17  Temp: 97.7 F (36.5 C) 97.6 F (36.4 C)  97.8 F (36.6 C)  TempSrc: Temporal Temporal    SpO2: 99%   100%  Weight:      Height:        Intake/Output Summary (Last 24 hours) at 10/09/2021 1355 Last data filed at 10/09/2021 1100 Gross per 24 hour  Intake 780 ml  Output 200 ml  Net 580 ml   Filed Weights   10/07/21 1204  Weight: 72.6 kg    Examination:  General exam: Appears calm and comfortable  Respiratory system: Clear to auscultation. Respiratory effort normal. Cardiovascular system: moderate systolic murmur Gastrointestinal system: Abdomen is nondistended, soft and nontender. No organomegaly or masses felt. Normal bowel sounds heard. Central nervous system: Alert and oriented. No focal neurological deficits. Extremities:  Symmetric 5 x 5 power. Skin: No rashes, lesions or ulcers Psychiatry: Judgement and insight appear normal. Mood & affect appropriate.     Data Reviewed: I have personally reviewed following labs and imaging studies  CBC: Recent Labs  Lab 10/06/21 1556 10/07/21 1208 10/07/21 2013 10/08/21 0358 10/08/21 1457 10/09/21 0532  WBC 13.7* 13.7*  --   --   --  11.2*  NEUTROABS 10.1*  --   --   --   --   --   HGB 7.4 Repeated and verified X2.* 7.2* 7.4* 7.3* 8.6* 9.0*  HCT 21.9 Repeated and verified X2.* 21.9* 22.7* 22.5*  --  27.0*  MCV 97.9 101.4*  --   --   --  92.5  PLT 268.0 265  --   --   --  322   Basic Metabolic Panel: Recent Labs  Lab 10/07/21 1208 10/08/21 0358 10/09/21 0532  NA 138 139 139  K 4.8 4.3 4.1  CL 106 110 110  CO2 24 22 23   GLUCOSE 100* 81 89  BUN 23 20 13   CREATININE 1.60* 1.33* 1.18  CALCIUM 8.5* 8.3* 8.9   GFR: Estimated Creatinine Clearance: 45.1 mL/min (by C-G formula based on SCr of 1.18 mg/dL). Liver Function Tests: Recent Labs  Lab 10/07/21 1504  AST 21  ALT 15  ALKPHOS 31*  BILITOT 0.5  PROT 6.1*  ALBUMIN 3.5   No results for input(s): LIPASE, AMYLASE in the last 168 hours. No results for input(s): AMMONIA in the last 168 hours. Coagulation Profile: Recent Labs  Lab 10/08/21 1457  INR 1.1   Cardiac Enzymes: No results for input(s): CKTOTAL, CKMB, CKMBINDEX, TROPONINI in the last 168 hours. BNP (last 3 results) No results for input(s): PROBNP in the last 8760 hours. HbA1C: No results for input(s): HGBA1C in the last 72 hours. CBG: No results for input(s): GLUCAP in the last 168 hours. Lipid Profile: No results for input(s): CHOL, HDL, LDLCALC, TRIG, CHOLHDL, LDLDIRECT in the last 72 hours. Thyroid Function Tests: No results for input(s): TSH, T4TOTAL, FREET4, T3FREE, THYROIDAB in the last 72 hours. Anemia Panel: No results for input(s): VITAMINB12, FOLATE, FERRITIN, TIBC, IRON, RETICCTPCT in the last 72 hours. Urine  analysis:    Component Value Date/Time   COLORURINE YELLOW 10/07/2021 [place] 10/07/2021 1208   LABSPEC 1.015 10/07/2021 1208   PHURINE 7.0 10/07/2021 1208   GLUCOSEU NEGATIVE 10/07/2021 1208   HGBUR NEGATIVE 10/07/2021 [place] 10/07/2021 1208   KETONESUR NEGATIVE 10/07/2021 1208   PROTEINUR NEGATIVE 10/07/2021 1208   NITRITE NEGATIVE 10/07/2021 1208   LEUKOCYTESUR NEGATIVE 10/07/2021 1208   Sepsis Labs: @LABRCNTIP (procalcitonin:4,lacticidven:4)  ) Recent Results (from the past 240 hour(s))  Fecal occult blood, imunochemical     Status: None   Collection Time: 10/07/21 12:00 AM  Result Value Ref Range Status   Fecal Occult Bld CANCELED      Comment: Test not performed. No Immuno Polymedco OC sampling bottle received.  Result canceled by the ancillary.   Resp Panel by RT-PCR (Flu A&B, Covid)  Nasopharyngeal Swab     Status: None   Collection Time: 10/07/21  3:05 PM   Specimen: Nasopharyngeal Swab; Nasopharyngeal(NP) swabs in vial transport medium  Result Value Ref Range Status   SARS Coronavirus 2 by RT PCR NEGATIVE NEGATIVE Final    Comment: (NOTE) SARS-CoV-2 target nucleic acids are NOT DETECTED.  The SARS-CoV-2 RNA is generally detectable in upper respiratory specimens during the acute phase of infection. The lowest concentration of SARS-CoV-2 viral copies this assay can detect is 138 copies/mL. A negative result does not preclude SARS-Cov-2 infection and should not be used as the sole basis for treatment or other patient management decisions. A negative result may occur with  improper specimen collection/handling, submission of specimen other than nasopharyngeal swab, presence of viral mutation(s) within the areas targeted by this assay, and inadequate number of viral copies(<138 copies/mL). A negative result must be combined with clinical observations, patient history, and epidemiological information. The expected result is  Negative.  Fact Sheet for Patients:  EntrepreneurPulse.com.au  Fact Sheet for Healthcare Providers:  IncredibleEmployment.be  This test is no t yet approved or cleared by the [place] FDA and  has been authorized for detection and/or diagnosis of SARS-CoV-2 by FDA under an Emergency Use Authorization (EUA). This EUA will remain  in effect (meaning this test can be used) for the duration of the COVID-19 declaration under Section 564(b)(1) of the Act, 21 U.S.C.section 360bbb-3(b)(1), unless the authorization is terminated  or revoked sooner.       Influenza A by PCR NEGATIVE NEGATIVE Final   Influenza B by PCR NEGATIVE NEGATIVE Final    Comment: (NOTE) The Xpert Xpress SARS-CoV-2/FLU/RSV plus assay is intended as an aid in the diagnosis of influenza from Nasopharyngeal swab specimens and should not be used as a sole basis for treatment. Nasal washings and aspirates are unacceptable for Xpert Xpress SARS-CoV-2/FLU/RSV testing.  Fact Sheet for Patients: EntrepreneurPulse.com.au  Fact Sheet for Healthcare Providers: IncredibleEmployment.be  This test is not yet approved or cleared by the [place] FDA and has been authorized for detection and/or diagnosis of SARS-CoV-2 by FDA under an Emergency Use Authorization (EUA). This EUA will remain in effect (meaning this test can be used) for the duration of the COVID-19 declaration under Section 564(b)(1) of the Act, 21 U.S.C. section 360bbb-3(b)(1), unless the authorization is terminated or revoked.  Performed at [facility], [address] [place], [place] [address]          Radiology Studies: No results found.      Scheduled Meds:  amLODipine  5 mg Oral Daily   levothyroxine  75 mcg Oral Q0600   metoprolol tartrate  12.5 mg Oral BID   [START ON 10/11/2021] pantoprazole  40 mg Intravenous Q12H   tamsulosin  0.4 mg Oral Daily    Continuous Infusions:  sodium chloride       LOS: 2 days    Time spent: 30 min    Desma Maxim, MD [facility]   If 7PM-7AM, please contact night-coverage [email or web address] Password [email or web address] 10/09/2021, 1:55 PM

## 2021-10-09 NOTE — Plan of Care (Signed)
  Problem: Education: Goal: Knowledge of General Education information will improve Description Including pain rating scale, medication(s)/side effects and non-pharmacologic comfort measures Outcome: Progressing   Problem: Health Behavior/Discharge Planning: Goal: Ability to manage health-related needs will improve Outcome: Progressing   

## 2021-10-09 NOTE — Anesthesia Postprocedure Evaluation (Signed)
Anesthesia Post Note  Patient: Ryan Pittman  Procedure(s) Performed: ESOPHAGOGASTRODUODENOSCOPY (EGD) COLONOSCOPY  Patient location during evaluation: PACU Anesthesia Type: General Level of consciousness: awake and alert Pain management: pain level controlled Vital Signs Assessment: post-procedure vital signs reviewed and stable Respiratory status: spontaneous breathing, nonlabored ventilation, respiratory function stable and patient connected to nasal cannula oxygen Cardiovascular status: blood pressure returned to baseline and stable Postop Assessment: no apparent nausea or vomiting Anesthetic complications: no   No notable events documented.   Last Vitals:  Vitals:   10/09/21 1151 10/09/21 1524  BP: (!) 159/52 (!) 157/60  Pulse: 60 74  Resp: 17 18  Temp: 36.6 C 36.6 C  SpO2: 100% 99%    Last Pain:  Vitals:   10/09/21 1524  TempSrc: Oral  PainSc:                  Molli Barrows

## 2021-10-10 ENCOUNTER — Encounter: Payer: Self-pay | Admitting: Internal Medicine

## 2021-10-10 DIAGNOSIS — D62 Acute posthemorrhagic anemia: Secondary | ICD-10-CM | POA: Diagnosis not present

## 2021-10-10 LAB — BPAM RBC
Blood Product Expiration Date: 202302132359
Blood Product Expiration Date: 202302132359
Blood Product Expiration Date: 202302132359
Blood Product Expiration Date: 202302132359
ISSUE DATE / TIME: 202302031605
ISSUE DATE / TIME: 202302041110
Unit Type and Rh: 600
Unit Type and Rh: 600
Unit Type and Rh: 600
Unit Type and Rh: 600

## 2021-10-10 LAB — TYPE AND SCREEN
ABO/RH(D): A NEG
Antibody Screen: NEGATIVE
Unit division: 0
Unit division: 0
Unit division: 0
Unit division: 0

## 2021-10-10 LAB — BASIC METABOLIC PANEL
Anion gap: 4 — ABNORMAL LOW (ref 5–15)
BUN: 13 mg/dL (ref 8–23)
CO2: 25 mmol/L (ref 22–32)
Calcium: 9 mg/dL (ref 8.9–10.3)
Chloride: 108 mmol/L (ref 98–111)
Creatinine, Ser: 1.21 mg/dL (ref 0.61–1.24)
GFR, Estimated: 60 mL/min — ABNORMAL LOW (ref >60)
Glucose, Bld: 96 mg/dL (ref 70–99)
Potassium: 4.3 mmol/L (ref 3.5–5.1)
Sodium: 137 mmol/L (ref 135–145)

## 2021-10-10 LAB — CBC
HCT: 26.8 % — ABNORMAL LOW (ref 39.0–52.0)
Hemoglobin: 9 g/dL — ABNORMAL LOW (ref 13.0–17.0)
MCH: 31.3 pg (ref 26.0–34.0)
MCHC: 33.6 g/dL (ref 30.0–36.0)
MCV: 93.1 fL (ref 80.0–100.0)
Platelets: 219 10*3/uL (ref 150–400)
RBC: 2.88 MIL/uL — ABNORMAL LOW (ref 4.22–5.81)
RDW: 22.3 % — ABNORMAL HIGH (ref 11.5–15.5)
WBC: 10.7 10*3/uL — ABNORMAL HIGH (ref 4.0–10.5)
nRBC: 0 % (ref 0.0–0.2)

## 2021-10-10 LAB — PREPARE RBC (CROSSMATCH)

## 2021-10-10 MED ORDER — SODIUM CHLORIDE 0.9 % IV SOLN
200.0000 mg | Freq: Once | INTRAVENOUS | Status: AC
  Administered 2021-10-10: 200 mg via INTRAVENOUS
  Filled 2021-10-10: qty 200

## 2021-10-10 NOTE — Discharge Summary (Signed)
Ryan Pittman EZM:[id number] DOB: 27-Aug-1941 DOA: 10/07/2021  PCP: Ryan Ghent, MD  Admit date: 10/07/2021 Discharge date: 10/10/2021  Time spent: 45 minutes  Recommendations for Outpatient Follow-up:  Pcp f/u 1 week, check hemoglobin then GI f/u 4 weeks     Discharge Diagnoses:  Principal Problem:   Acute blood loss anemia (ABLA) Active Problems:   Essential hypertension, benign   CKD (chronic kidney disease) stage 3, GFR 30-59 ml/min (HCC)   BPH with obstruction/lower urinary tract symptoms   Hypothyroidism, postsurgical   PAF (paroxysmal atrial fibrillation) (HCC)   Acute respiratory failure with hypoxia and hypercapnia ([place])   Discharge Condition: stable  Diet recommendation: heart healthy  Filed Weights   10/07/21 1204  Weight: 72.6 kg    History of present illness:  From dr. Dorthula Pittman h and p Ryan Pittman is a 81 y.o. male with medical history significant of atrial fibrillation on anticoagulation therapy with Eliquis, coronary artery disease status post CABG, hypothyroidism, hypertension, peripheral arterial disease status post left femoropopliteal bypass, GERD and stage III chronic kidney disease who presents to the ER at the request of his primary care provider for evaluation of increased weakness, fatigue and exertional shortness of breath. Patient states that he was recently started on Eliquis and notes that over the last several days his stools have been black in color.  He denies any frank bleeding or hematemesis and denies NSAID use. He denies any falls or loss of consciousness. He denies having any chest pain, no nausea, no vomiting, no changes in his bowel habits, no headache, no fever, no chills, no palpitations, no diaphoresis, no focal deficit or blurred vision  Hospital Course:  Patient was admitted for symptomatic anemia from GI bleed in the setting of recently transitioning from plavix to eliquis/aspirin. Hbg found to be 7s from 13 at baseline.  Upper and lower endoscopies unrevealing. 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 Patient was treated with 2 units PRBCs and hgb improved to 9 and was stable there. No overt bleeding while inpatient. GI in favor of resuming eliquis/aspirin with close monitoring. I discussed risks/benefits of this with the patient and his wife and they are in agreement. Advising PCP f/u in 1 week for hemoglobin check, wife and husband are well aware of bleeding signs/symptoms. Will give dose of IV iron prior to discharge. Chronic conditions stable, home meds continued.   Procedures: Upper endoscopy, colonoscopy, capsule endoscopy   Consultations: GI  Discharge Exam: Vitals:   10/10/21 0832 10/10/21 1136  BP: (!) 141/63 (!) 130/52  Pulse: 65 (!) 59  Resp: 16 17  Temp: (!) 97.5 F (36.4 C) 97.6 F (36.4 C)  SpO2: 100% 98%    General exam: Appears calm and comfortable  Respiratory system: Clear to auscultation. Respiratory effort normal. Cardiovascular system: moderate systolic murmur Gastrointestinal system: Abdomen is nondistended, soft and nontender. No organomegaly or masses felt. Normal bowel sounds heard. Central nervous system: Alert and oriented. No focal neurological deficits. Extremities: Symmetric 5 x 5 power. Skin: No rashes, lesions or ulcers Psychiatry: Judgement and insight appear normal. Mood & affect appropriate.   Discharge Instructions   Discharge Instructions     Diet - low sodium heart healthy   Complete by: As directed    Increase activity slowly   Complete by: As directed       Allergies as of 10/10/2021       Reactions   Penicillins Itching, Other (See Comments)  PATIENT HAS HAD A PCN REACTION WITH IMMEDIATE RASH, FACIAL/TONGUE/THROAT SWELLING, SOB, OR LIGHTHEADEDNESS WITH HYPOTENSION:  #  #  YES  #  #  Has patient had a PCN reaction causing severe rash involving mucus membranes or skin necrosis: No Has patient had a PCN reaction  that required hospitalization: No Has patient had a PCN reaction occurring within the last 10 years: No If all of the above answers are "NO", then may proceed with Cephalosporin use.   Ace Inhibitors Cough   Aspirin Other (See Comments)   H/o GI bleed   Celebrex [celecoxib] Other (See Comments)   GI bleed   Lipitor [atorvastatin] Other (See Comments)   myalgias        Medication List     TAKE these medications    amLODipine 10 MG tablet Commonly known as: NORVASC Take 0.5-1 tablets (5-10 mg total) by mouth daily.   apixaban 2.5 MG Tabs tablet Commonly known as: ELIQUIS Take 1 tablet (2.5 mg total) by mouth 2 (two) times daily.   aspirin EC 81 MG tablet Take 81 mg by mouth daily. Swallow whole.   levothyroxine 75 MCG tablet Commonly known as: SYNTHROID TAKE 1 TABLET BY MOUTH DAILY EXCEPT ON THURSDAYS TAKE 1.5 TABLETS   losartan 100 MG tablet Commonly known as: COZAAR Take 1 tablet (100 mg total) by mouth daily. What changed: when to take this   metoprolol tartrate 25 MG tablet Commonly known as: LOPRESSOR Take 0.5 tablets (12.5 mg total) by mouth 2 (two) times daily.   omeprazole 20 MG capsule Commonly known as: PRILOSEC Take 1 capsule (20 mg total) by mouth daily.   pramipexole 1 MG tablet Commonly known as: MIRAPEX Take 1 tablet (1 mg total) by mouth daily.   Repatha SureClick 166 MG/ML Soaj Generic drug: Evolocumab INJECT 140MG  EVERY 14 DAYS   sulfamethoxazole-trimethoprim 400-80 MG tablet Commonly known as: BACTRIM Take 1 tablet by mouth 3 (three) times a week.   tamsulosin 0.4 MG Caps capsule Commonly known as: FLOMAX Take 1 capsule (0.4 mg total) by mouth daily.       Allergies  Allergen Reactions   Penicillins Itching and Other (See Comments)    PATIENT HAS HAD A PCN REACTION WITH IMMEDIATE RASH, FACIAL/TONGUE/THROAT SWELLING, SOB, OR LIGHTHEADEDNESS WITH HYPOTENSION:  #  #  YES  #  #  Has patient had a PCN reaction causing severe rash  involving mucus membranes or skin necrosis: No Has patient had a PCN reaction that required hospitalization: No Has patient had a PCN reaction occurring within the last 10 years: No If all of the above answers are "NO", then may proceed with Cephalosporin use.    Ace Inhibitors Cough   Aspirin Other (See Comments)    H/o GI bleed   Celebrex [Celecoxib] Other (See Comments)    GI bleed   Lipitor [Atorvastatin] Other (See Comments)    myalgias    Follow-up Information     Ryan Ghent, MD Follow up.   Specialty: Family Medicine Why: 1 week Contact information: [address] [address] [place], [place] Follow up in 4 week(s).   Specialty: Gastroenterology Contact information: [address] [address] [phone]                  The results of significant diagnostics from this hospitalization (including imaging, microbiology, ancillary and laboratory) are listed below for reference.  Significant Diagnostic Studies: ECHOCARDIOGRAM COMPLETE  Result Date: 09/19/2021    ECHOCARDIOGRAM REPORT   Patient Name:   TASHAWN LASWELL Date of Exam: 09/19/2021 Medical Rec #:  [id number]        Height:       67.0 in Accession #:    [id number]       Weight:       158.0 lb Date of Birth:  Aug 26, 1941        BSA:          1.829 m Patient Age:    71 years         BP:           144/70 mmHg Patient Gender: M                HR:           80 bpm. 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 CAD, Prior CABG, PAD, Aortic Valve Disease and                 Mitral Valve Disease, Arrythmias:Atrial Fibrillation,                 Signs/Symptoms:Chest Pain and Shortness of Breath; Risk                  Factors:Former Smoker, Dyslipidemia and Hypertension.  Sonographer:    Deliah Boston RDCS Referring Phys: [place] Colonel IMPRESSIONS  1. Left ventricular ejection fraction, by estimation, is 60 to 65%. The left ventricle has normal function. The left ventricle has no regional wall motion abnormalities. There is mild left ventricular hypertrophy. Left ventricular diastolic parameters are consistent with Grade I diastolic dysfunction (impaired relaxation).  2. Right ventricular systolic function is normal. The right ventricular size is normal. There is normal pulmonary artery systolic pressure. The estimated right ventricular systolic pressure is 01.7 mmHg.  3. The mitral valve is normal in structure. No evidence of mitral valve regurgitation.  4. The aortic valve was not well visualized. Aortic valve regurgitation is not visualized. Mild aortic valve stenosis. Vmax 2.2 m/s, MG 11 mmHg, DI 0.51  5. The inferior vena cava is normal in size with greater than 50% respiratory variability, suggesting right atrial pressure of 3 mmHg. FINDINGS  Left Ventricle: Left ventricular ejection fraction, by estimation, is 60 to 65%. The left ventricle has normal function. The left ventricle has no regional wall motion abnormalities. The left ventricular internal cavity size was normal in size. There is  mild left ventricular hypertrophy. Left ventricular diastolic parameters are consistent with Grade I diastolic dysfunction (impaired relaxation). Right Ventricle: The right ventricular size is normal. No increase in right ventricular wall thickness. Right ventricular systolic function is normal. There is normal pulmonary artery systolic pressure. The tricuspid regurgitant velocity is 2.25 m/s, and  with an assumed right atrial pressure of 3 mmHg, the estimated right ventricular systolic pressure is 79.3 mmHg. Left Atrium: Left atrial size was normal in size. Right Atrium: Right atrial size was normal in size.  Pericardium: There is no evidence of pericardial effusion.  Mitral Valve: The mitral valve is normal in structure. No evidence of mitral valve regurgitation. Tricuspid Valve: The tricuspid valve is normal in structure. Tricuspid valve regurgitation is trivial. Aortic Valve: The aortic valve was not well visualized. Aortic valve regurgitation is not visualized. Mild aortic stenosis is present. Aortic valve mean gradient measures 9.0 mmHg. Aortic valve peak gradient measures 16.1 mmHg. Aortic valve area, by VTI measures 2.39 cm. Pulmonic Valve: The pulmonic valve was not well visualized. Pulmonic valve regurgitation is not visualized. Aorta: The aortic root and ascending aorta are structurally normal, with no evidence of dilitation. Venous: The inferior vena cava is normal in size with greater than 50% respiratory variability, suggesting right atrial pressure of 3 mmHg. IAS/Shunts: The interatrial septum was not well visualized.  LEFT VENTRICLE PLAX 2D LVIDd:         3.40 cm   Diastology LVIDs:         1.80 cm   LV e' medial:    6.74 cm/s LV PW:         1.00 cm   LV E/e' medial:  14.9 LV IVS:        1.30 cm   LV e' lateral:   10.70 cm/s LVOT diam:     2.40 cm   LV E/e' lateral: 9.4 LV SV:         92 LV SV Index:   50 LVOT Area:     4.52 cm                           3D Volume EF:                          3D EF:        65 %                          LV EDV:       122 ml                          LV ESV:       43 ml                          LV SV:        79 ml RIGHT VENTRICLE RV S prime:     9.79 cm/s TAPSE (M-mode): 1.3 cm LEFT ATRIUM             Index        RIGHT ATRIUM           Index LA diam:        4.60 cm 2.51 cm/m   RA Area:     15.80 cm LA Vol (A2C):   75.2 ml 41.10 ml/m  RA Volume:   38.00 ml  20.77 ml/m LA Vol (A4C):   34.7 ml 18.97 ml/m LA Biplane Vol: 53.6 ml 29.30 ml/m  AORTIC VALVE AV Area (Vmax):    2.23 cm AV Area (Vmean):   2.13 cm AV Area (VTI):     2.39 cm AV Vmax:           200.50 cm/s AV  Vmean:          137.250 cm/s AV VTI:            0.386 m AV Peak  Grad:      16.1 mmHg AV Mean Grad:      9.0 mmHg LVOT Vmax:         98.95 cm/s LVOT Vmean:        64.750 cm/s LVOT VTI:          0.204 m LVOT/AV VTI ratio: 0.53  AORTA Ao Root diam: 3.10 cm Ao Asc diam:  3.20 cm MITRAL VALVE                TRICUSPID VALVE MV Area (PHT): cm          TR Peak grad:   20.2 mmHg MV Decel Time: 267 msec     TR Vmax:        225.00 cm/s MV E velocity: 100.75 cm/s MV A velocity: 140.00 cm/s  SHUNTS MV E/A ratio:  0.72         Systemic VTI:  0.20 m                             Systemic Diam: 2.40 cm Oswaldo Milian MD Electronically signed by Oswaldo Milian MD Signature Date/Time: 09/19/2021/5:54:18 PM    Final    LONG TERM MONITOR (3-14 DAYS)  Result Date: 09/21/2021 Patch Wear Time:  13 days and 23 hours (2022-12-30T08:07:22-0500 to 2023-01-13T07:57:49-0500) Patient had a min HR of 46 bpm, max HR of 187 bpm, and avg HR of 71 bpm. Predominant underlying rhythm was Sinus Rhythm. 1 run of Ventricular Tachycardia occurred lasting 8 beats with a max rate of 156 bpm (avg 138 bpm). 27 Supraventricular Tachycardia runs occurred, the run with the fastest interval lasting 13 beats with a max rate of 187 bpm, the longest lasting 11 beats with an avg rate of 103 bpm. Some episodes of Supraventricular Tachycardia may be possible Atrial Tachycardia with variable block. True duration of Supraventricular Tachycardia difficult to ascertain due to artifact. Isolated SVEs were rare (<1.0%), SVE Couplets were rare (<1.0%), and SVE Triplets were rare (<1.0%). Isolated VEs were rare (<1.0%), VE Couplets were rare (<1.0%), and no VE Triplets were present. 1: Sinus rhythm/sinus bradycardia/sinus tachycardia 2: Rare PACs and PVCs 3: Rare short episodes of nonsustained ventricular tachycardia and SVT (27 episodes of SVT).  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 Patient              denies any abdominal pain. High Risk Factors: Hypertension, hyperlipidemia, past history of smoking,                    coronary artery disease. Other Factors: CABG. Limitations: Air/bowel gas. Comparison Study: In 10/2020, a renal artery duplex showed velocities of 182/37                   cm/s in the right renal artery suggesting 1-59% diameter                   reduction, and 254/38 cm/s  in the left renal artery suggesting                   >60% diameter reduction. Performing Technologist: Sharlett Iles RVT  Examination Guidelines: A complete evaluation includes B-mode imaging, spectral Doppler, color Doppler, and power Doppler as needed of all accessible portions of each vessel. Bilateral testing is considered an integral part of a complete examination. Limited examinations for reoccurring indications may be performed as noted.  Duplex Findings: +----------------------+--------+--------+------+----------------------+  Mesenteric             PSV cm/s EDV cm/s Plaque        Comments         +----------------------+--------+--------+------+----------------------+  Aorta Prox                71                    2.4 cm AP x 2.4 cm TRV  +----------------------+--------+--------+------+----------------------+  Aorta Distal              49                                            +----------------------+--------+--------+------+----------------------+  Celiac Artery Proximal   181       21                                    +----------------------+--------+--------+------+----------------------+  SMA Proximal             289       38                                   +----------------------+--------+--------+------+----------------------+  +------------------+--------+--------+-------+  Right Renal Artery PSV cm/s EDV cm/s Comment  +------------------+--------+--------+-------+  Origin               208       19             +------------------+--------+--------+-------+  Proximal             267       19             +------------------+--------+--------+-------+  Mid                  227       25             +------------------+--------+--------+-------+  Distal               160       19             +------------------+--------+--------+-------+ +-----------------+--------+--------+-------+  Left Renal Artery PSV cm/s EDV cm/s Comment  +-----------------+--------+--------+-------+  Origin              371       26             +-----------------+--------+--------+-------+  Proximal            394       20             +-----------------+--------+--------+-------+  Mid                 167  16             +-----------------+--------+--------+-------+  Distal              107       20             +-----------------+--------+--------+-------+  Technologist observations: Multiple cystic lesion is the liver. 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 +------------+--------+--------+----+-----------+--------+--------+----+  Right Kidney PSV cm/s EDV cm/s RI   Left Kidney PSV cm/s EDV cm/s RI    +------------+--------+--------+----+-----------+--------+--------+----+  Upper Pole   55       6        0.87 Upper Pole  24       0        1.00  +------------+--------+--------+----+-----------+--------+--------+----+  Mid          38       5        0.87 Mid         16       0        1.00  +------------+--------+--------+----+-----------+--------+--------+----+  Lower Pole   30       4         0.85 Lower Pole  33       0        1.00  +------------+--------+--------+----+-----------+--------+--------+----+  Hilar        86       8        0.91 Hilar       50       0        1.00  +------------+--------+--------+----+-----------+--------+--------+----+ +------------------+------+------------------+------+  Right Kidney              Left Kidney                +------------------+------+------------------+------+  RAR                       RAR                        +------------------+------+------------------+------+  RAR (manual)       3.76   RAR (manual)       5.55    +------------------+------+------------------+------+  Cortex             .79 cm Cortex             .60 cm  +------------------+------+------------------+------+  Cortex thickness          Corex thickness            +------------------+------+------------------+------+  Kidney length (cm) 11.50  Kidney length (cm) 10.42   +------------------+------+------------------+------+  Summary: Largest Aortic Diameter: Aortic atherosclerosis 2.4 cm  Renal:  Right: Normal size right kidney. Abnormal right Resistive Index.        Normal cortical thickness of right kidney. Evidence of a >        60% stenosis of the right renal artery. RRV flow present. Left:  Normal size of left kidney. Abnormal left Resistive Index.        Normal cortical thickness of the left kidney. Evidence of a >        60% stenosis in the left renal artery. LRV flow present. Mesenteric: Normal Celiac artery findings. 70 to 99% stenosis in the superior mesenteric artery.  Patent IVC.  Incidental findings: Avascular septated cyst in the left lobe  of the liver, measuring 3.8 x 2.8 x 3.8 cm.  *See table(s) above for measurements and observations.  Vascular consult recommended. Diagnosing physician: Larae Grooms MD  Electronically signed by Larae Grooms MD on 09/24/2021 at 10:26:00 PM.    Final     Microbiology: Recent Results (from the past 240 hour(s))  Fecal occult  blood, imunochemical     Status: None   Collection Time: 10/07/21 12:00 AM  Result Value Ref Range Status   Fecal Occult Bld CANCELED      Comment: Test not performed. No Immuno Polymedco OC sampling bottle received.  Result canceled by the ancillary.   Resp Panel by RT-PCR (Flu A&B, Covid) Nasopharyngeal Swab     Status: None   Collection Time: 10/07/21  3:05 PM   Specimen: Nasopharyngeal Swab; Nasopharyngeal(NP) swabs in vial transport medium  Result Value Ref Range Status   SARS Coronavirus 2 by RT PCR NEGATIVE NEGATIVE Final    Comment: (NOTE) SARS-CoV-2 target nucleic acids are NOT DETECTED.  The SARS-CoV-2 RNA is generally detectable in upper respiratory specimens during the acute phase of infection. The lowest concentration of SARS-CoV-2 viral copies this assay can detect is 138 copies/mL. A negative result does not preclude SARS-Cov-2 infection and should not be used as the sole basis for treatment or other patient management decisions. A negative result may occur with  improper specimen collection/handling, submission of specimen other than nasopharyngeal swab, presence of viral mutation(s) within the areas targeted by this assay, and inadequate number of viral copies(<138 copies/mL). A negative result must be combined with clinical observations, patient history, and epidemiological information. The expected result is Negative.  Fact Sheet for Patients:  EntrepreneurPulse.com.au  Fact Sheet for Healthcare Providers:  IncredibleEmployment.be  This test is no t yet approved or cleared by the [place] FDA and  has been authorized for detection and/or diagnosis of SARS-CoV-2 by FDA under an Emergency Use Authorization (EUA). This EUA will remain  in effect (meaning this test can be used) for the duration of the COVID-19 declaration under Section 564(b)(1) of the Act, 21 U.S.C.section 360bbb-3(b)(1), unless the authorization is  terminated  or revoked sooner.       Influenza A by PCR NEGATIVE NEGATIVE Final   Influenza B by PCR NEGATIVE NEGATIVE Final    Comment: (NOTE) The Xpert Xpress SARS-CoV-2/FLU/RSV plus assay is intended as an aid in the diagnosis of influenza from Nasopharyngeal swab specimens and should not be used as a sole basis for treatment. Nasal washings and aspirates are unacceptable for Xpert Xpress SARS-CoV-2/FLU/RSV testing.  Fact Sheet for Patients: EntrepreneurPulse.com.au  Fact Sheet for Healthcare Providers: IncredibleEmployment.be  This test is not yet approved or cleared by the [place] FDA and has been authorized for detection and/or diagnosis of SARS-CoV-2 by FDA under an Emergency Use Authorization (EUA). This EUA will remain in effect (meaning this test can be used) for the duration of the COVID-19 declaration under Section 564(b)(1) of the Act, 21 U.S.C. section 360bbb-3(b)(1), unless the authorization is terminated or revoked.  Performed at [facility], [address] [place], [place] [address]      Labs: Basic Metabolic Panel: Recent Labs  Lab 10/07/21 1208 10/08/21 0358 10/09/21 0532 10/10/21 0359  NA 138 139 139 137  K 4.8 4.3 4.1 4.3  CL 106 110 110 108  CO2 24 22 23 25   GLUCOSE 100* 81 89 96  BUN 23 20 13 13   CREATININE 1.60* 1.33* 1.18 1.21  CALCIUM 8.5* 8.3*  8.9 9.0   Liver Function Tests: Recent Labs  Lab 10/07/21 1504  AST 21  ALT 15  ALKPHOS 31*  BILITOT 0.5  PROT 6.1*  ALBUMIN 3.5   No results for input(s): LIPASE, AMYLASE in the last 168 hours. No results for input(s): AMMONIA in the last 168 hours. CBC: Recent Labs  Lab 10/06/21 1556 10/07/21 1208 10/07/21 2013 10/08/21 0358 10/08/21 1457 10/09/21 0532 10/10/21 0359  WBC 13.7* 13.7*  --   --   --  11.2* 10.7*  NEUTROABS 10.1*  --   --   --   --   --   --   HGB 7.4 Repeated and verified X2.* 7.2* 7.4* 7.3* 8.6* 9.0* 9.0*   HCT 21.9 Repeated and verified X2.* 21.9* 22.7* 22.5*  --  27.0* 26.8*  MCV 97.9 101.4*  --   --   --  92.5 93.1  PLT 268.0 265  --   --   --  220 219   Cardiac Enzymes: No results for input(s): CKTOTAL, CKMB, CKMBINDEX, TROPONINI in the last 168 hours. BNP: BNP (last 3 results) No results for input(s): BNP in the last 8760 hours.  ProBNP (last 3 results) No results for input(s): PROBNP in the last 8760 hours.  CBG: No results for input(s): GLUCAP in the last 168 hours.     Signed:  Desma Maxim MD.  [facility] 10/10/2021, 1:49 PM

## 2021-10-10 NOTE — Plan of Care (Signed)

## 2021-10-11 ENCOUNTER — Encounter (HOSPITAL_COMMUNITY): Payer: Medicare Other

## 2021-10-11 ENCOUNTER — Telehealth (HOSPITAL_COMMUNITY): Payer: Self-pay | Admitting: *Deleted

## 2021-10-11 ENCOUNTER — Telehealth: Payer: Self-pay

## 2021-10-11 ENCOUNTER — Telehealth: Payer: Self-pay | Admitting: Cardiovascular Disease

## 2021-10-11 DIAGNOSIS — R5383 Other fatigue: Secondary | ICD-10-CM | POA: Diagnosis not present

## 2021-10-11 DIAGNOSIS — Z7901 Long term (current) use of anticoagulants: Secondary | ICD-10-CM | POA: Diagnosis not present

## 2021-10-11 DIAGNOSIS — D472 Monoclonal gammopathy: Secondary | ICD-10-CM | POA: Diagnosis not present

## 2021-10-11 DIAGNOSIS — Z8616 Personal history of COVID-19: Secondary | ICD-10-CM | POA: Diagnosis not present

## 2021-10-11 DIAGNOSIS — I129 Hypertensive chronic kidney disease with stage 1 through stage 4 chronic kidney disease, or unspecified chronic kidney disease: Secondary | ICD-10-CM | POA: Diagnosis not present

## 2021-10-11 DIAGNOSIS — I739 Peripheral vascular disease, unspecified: Secondary | ICD-10-CM | POA: Diagnosis not present

## 2021-10-11 DIAGNOSIS — E890 Postprocedural hypothyroidism: Secondary | ICD-10-CM | POA: Diagnosis not present

## 2021-10-11 DIAGNOSIS — Z79899 Other long term (current) drug therapy: Secondary | ICD-10-CM | POA: Diagnosis not present

## 2021-10-11 DIAGNOSIS — N182 Chronic kidney disease, stage 2 (mild): Secondary | ICD-10-CM | POA: Diagnosis not present

## 2021-10-11 DIAGNOSIS — D72829 Elevated white blood cell count, unspecified: Secondary | ICD-10-CM | POA: Diagnosis not present

## 2021-10-11 DIAGNOSIS — Z8701 Personal history of pneumonia (recurrent): Secondary | ICD-10-CM | POA: Diagnosis not present

## 2021-10-11 NOTE — Telephone Encounter (Signed)
Spoke with wife who reports pt was admitted to hospital for GI bleeding and needed 2 units of blood. He was discharged yesterday. Pt is scheduled for lexiscan tomorrow morning and wife wants to know if this should be postponed.

## 2021-10-11 NOTE — Telephone Encounter (Signed)
Spoke with pt's wife (ok per DPR) regarding lexiscan. Per Dr. Gwenlyn Found, we can postpone for 1-2 weeks until pt is feeling better. Wife verbalizes understanding. Will send back to scheduling to set up new date for [facility].

## 2021-10-11 NOTE — Telephone Encounter (Signed)
Pts spouse states that he is just being released from the hospital and Doctor advised that pt may not be up for coming in to complete the Myocardial Perfusion but before cancelling the appt wanted to speak to the nurse to know what is advised for next steps.

## 2021-10-11 NOTE — Telephone Encounter (Signed)
Agree. Thanks

## 2021-10-11 NOTE — Telephone Encounter (Signed)
Transition Care Management Follow-up Telephone Call Date of discharge and from where: TCM DC [facility] 10-10-21 Dx: Acute blood loss anemia How have you been since you were released from the hospital? Doing good Any questions or concerns? No  Items Reviewed: Did the pt receive and understand the discharge instructions provided? Yes  Medications obtained and verified? Yes  Other? No  Any new allergies since your discharge? No  Dietary orders reviewed? Yes Do you have support at home? Yes   Home Care and Equipment/Supplies: Were home health services ordered? no If so, what is the name of the agency? na  Has the agency set up a time to come to the patient's home? not applicable Were any new equipment or medical supplies ordered?  No What is the name of the medical supply agency? na Were you able to get the supplies/equipment? not applicable Do you have any questions related to the use of the equipment or supplies? No  Functional Questionnaire: (I = Independent and D = Dependent) ADLs: I  Bathing/Dressing- I  Meal Prep- I  Eating- I  Maintaining continence- I  Transferring/Ambulation- I  Managing Meds- I  Follow up appointments reviewed:  PCP Hospital f/u appt confirmed? Yes  Scheduled to see Dr Damita Dunnings on 10-21-21 @ [facility] f/u appt confirmed? No  . Are transportation arrangements needed? No  If their condition worsens, is the pt aware to call PCP or go to the Emergency Dept.? Yes Was the patient provided with contact information for the PCP's office or ED? Yes Was to pt encouraged to call back with questions or concerns? Yes

## 2021-10-11 NOTE — Telephone Encounter (Signed)
Close encounter 

## 2021-10-12 ENCOUNTER — Ambulatory Visit (HOSPITAL_COMMUNITY)
Admission: RE | Admit: 2021-10-12 | Payer: Medicare Other | Source: Ambulatory Visit | Attending: Cardiovascular Disease | Admitting: Cardiovascular Disease

## 2021-10-14 ENCOUNTER — Encounter: Payer: Self-pay | Admitting: Cardiovascular Disease

## 2021-10-14 ENCOUNTER — Encounter: Payer: Self-pay | Admitting: Surgery

## 2021-10-14 NOTE — Telephone Encounter (Signed)
Spouse returned call to reschedule. Stated the 1-2 week time frame advised by RN from Dr. Gwenlyn Found falls on when the patient is scheduled for surgery. Wife feels the patient's chart was not reviewed as it should have been since this was advised when documentation is in the chart about his procedure. Wants to speak with Dr. Gwenlyn Found or RN on when they should reschedule due to this. States she is going to ask Advertising account executive as well.

## 2021-10-14 NOTE — Telephone Encounter (Signed)
Called to reschedule nuclear stress test, patient said that he was driving and would call back.

## 2021-10-19 ENCOUNTER — Encounter: Payer: Self-pay | Admitting: Surgery

## 2021-10-19 NOTE — Telephone Encounter (Signed)
See MyChart message

## 2021-10-21 ENCOUNTER — Ambulatory Visit: Payer: Medicare Other | Admitting: Family Medicine

## 2021-10-25 ENCOUNTER — Other Ambulatory Visit: Payer: Self-pay

## 2021-10-25 ENCOUNTER — Ambulatory Visit (HOSPITAL_COMMUNITY)
Admission: RE | Admit: 2021-10-25 | Discharge: 2021-10-25 | Disposition: A | Discharge status: Home / Self Care | Payer: Medicare Other | Attending: Surgery | Admitting: Surgery

## 2021-10-25 ENCOUNTER — Encounter (HOSPITAL_COMMUNITY)
Admission: RE | Disposition: A | Discharge status: Home / Self Care | Payer: Self-pay | Source: Home / Self Care | Attending: Surgery

## 2021-10-25 DIAGNOSIS — Z951 Presence of aortocoronary bypass graft: Secondary | ICD-10-CM | POA: Diagnosis not present

## 2021-10-25 DIAGNOSIS — I2510 Atherosclerotic heart disease of native coronary artery without angina pectoris: Secondary | ICD-10-CM | POA: Insufficient documentation

## 2021-10-25 DIAGNOSIS — Y832 Surgical operation with anastomosis, bypass or graft as the cause of abnormal reaction of the patient, or of later complication, without mention of misadventure at the time of the procedure: Secondary | ICD-10-CM | POA: Diagnosis not present

## 2021-10-25 DIAGNOSIS — Z7901 Long term (current) use of anticoagulants: Secondary | ICD-10-CM | POA: Diagnosis not present

## 2021-10-25 DIAGNOSIS — N189 Chronic kidney disease, unspecified: Secondary | ICD-10-CM | POA: Diagnosis not present

## 2021-10-25 DIAGNOSIS — I701 Atherosclerosis of renal artery: Secondary | ICD-10-CM | POA: Insufficient documentation

## 2021-10-25 DIAGNOSIS — I129 Hypertensive chronic kidney disease with stage 1 through stage 4 chronic kidney disease, or unspecified chronic kidney disease: Secondary | ICD-10-CM | POA: Insufficient documentation

## 2021-10-25 DIAGNOSIS — T82858A Stenosis of vascular prosthetic devices, implants and grafts, initial encounter: Secondary | ICD-10-CM | POA: Insufficient documentation

## 2021-10-25 DIAGNOSIS — I70211 Atherosclerosis of native arteries of extremities with intermittent claudication, right leg: Secondary | ICD-10-CM | POA: Insufficient documentation

## 2021-10-25 DIAGNOSIS — E7800 Pure hypercholesterolemia, unspecified: Secondary | ICD-10-CM | POA: Insufficient documentation

## 2021-10-25 DIAGNOSIS — Z87891 Personal history of nicotine dependence: Secondary | ICD-10-CM | POA: Diagnosis not present

## 2021-10-25 DIAGNOSIS — I4891 Unspecified atrial fibrillation: Secondary | ICD-10-CM | POA: Diagnosis not present

## 2021-10-25 HISTORY — PX: PERIPHERAL VASCULAR INTERVENTION: CATH118257

## 2021-10-25 HISTORY — PX: RENAL ANGIOGRAPHY: CATH118260

## 2021-10-25 HISTORY — PX: LOWER EXTREMITY ANGIOGRAPHY: CATH118251

## 2021-10-25 LAB — POCT I-STAT, CHEM 8
BUN: 30 mg/dL — ABNORMAL HIGH (ref 8–23)
Calcium, Ion: 1.24 mmol/L (ref 1.15–1.40)
Chloride: 107 mmol/L (ref 98–111)
Creatinine, Ser: 1.2 mg/dL (ref 0.61–1.24)
Glucose, Bld: 99 mg/dL (ref 70–99)
HCT: 33 % — ABNORMAL LOW (ref 39.0–52.0)
Hemoglobin: 11.2 g/dL — ABNORMAL LOW (ref 13.0–17.0)
Potassium: 4.3 mmol/L (ref 3.5–5.1)
Sodium: 141 mmol/L (ref 135–145)
TCO2: 25 mmol/L (ref 22–32)

## 2021-10-25 SURGERY — RENAL ANGIOGRAPHY
Anesthesia: LOCAL | Laterality: Right

## 2021-10-25 MED ORDER — OXYCODONE HCL 5 MG PO TABS: 5.0000 mg | ORAL_TABLET | ORAL | Status: DC | PRN

## 2021-10-25 MED ORDER — HEPARIN SODIUM (PORCINE) 1000 UNIT/ML IJ SOLN
INTRAMUSCULAR | Status: DC | PRN
  Administered 2021-10-25: 7000 [IU] via INTRAVENOUS

## 2021-10-25 MED ORDER — ASPIRIN EC 81 MG PO TBEC: 81.0000 mg | DELAYED_RELEASE_TABLET | Freq: Every day | ORAL | Status: DC

## 2021-10-25 MED ORDER — FENTANYL CITRATE (PF) 100 MCG/2ML IJ SOLN
INTRAMUSCULAR | Status: DC | PRN
  Administered 2021-10-25: 50 ug via INTRAVENOUS

## 2021-10-25 MED ORDER — SODIUM CHLORIDE 0.9% FLUSH: 3.0000 mL | INTRAVENOUS | Status: DC | PRN

## 2021-10-25 MED ORDER — LABETALOL HCL 5 MG/ML IV SOLN: 10.0000 mg | INTRAVENOUS | Status: DC | PRN

## 2021-10-25 MED ORDER — SODIUM CHLORIDE 0.9 % IV SOLN: 250.0000 mL | INTRAVENOUS | Status: DC | PRN

## 2021-10-25 MED ORDER — SODIUM CHLORIDE 0.9% FLUSH: 3.0000 mL | Freq: Two times a day (BID) | INTRAVENOUS | Status: DC

## 2021-10-25 MED ORDER — HYDRALAZINE HCL 20 MG/ML IJ SOLN: 5.0000 mg | INTRAMUSCULAR | Status: DC | PRN

## 2021-10-25 MED ORDER — ASPIRIN EC 81 MG PO TBEC
DELAYED_RELEASE_TABLET | ORAL | Status: AC
  Filled 2021-10-25: qty 1

## 2021-10-25 MED ORDER — SODIUM CHLORIDE 0.9 % WEIGHT BASED INFUSION: 1.0000 mL/kg/h | INTRAVENOUS | Status: DC

## 2021-10-25 MED ORDER — MIDAZOLAM HCL 2 MG/2ML IJ SOLN
INTRAMUSCULAR | Status: DC | PRN
Start: 2021-10-25 — End: 2021-10-25
  Administered 2021-10-25: 2 mg via INTRAVENOUS

## 2021-10-25 MED ORDER — LIDOCAINE HCL (PF) 1 % IJ SOLN
INTRAMUSCULAR | Status: DC | PRN
  Administered 2021-10-25: 12 mL

## 2021-10-25 MED ORDER — IODIXANOL 320 MG/ML IV SOLN
INTRAVENOUS | Status: DC | PRN
Start: 2021-10-25 — End: 2021-10-25
  Administered 2021-10-25: 135 mL

## 2021-10-25 MED ORDER — HEPARIN (PORCINE) IN NACL 1000-0.9 UT/500ML-% IV SOLN
INTRAVENOUS | Status: AC
  Filled 2021-10-25: qty 500

## 2021-10-25 MED ORDER — HEPARIN (PORCINE) IN NACL 1000-0.9 UT/500ML-% IV SOLN
INTRAVENOUS | Status: DC | PRN
  Administered 2021-10-25 (×2): 500 mL

## 2021-10-25 MED ORDER — FENTANYL CITRATE (PF) 100 MCG/2ML IJ SOLN
INTRAMUSCULAR | Status: AC
  Filled 2021-10-25: qty 2

## 2021-10-25 MED ORDER — LIDOCAINE HCL (PF) 1 % IJ SOLN
INTRAMUSCULAR | Status: AC
  Filled 2021-10-25: qty 30

## 2021-10-25 MED ORDER — ACETAMINOPHEN 325 MG PO TABS: 650.0000 mg | ORAL_TABLET | ORAL | Status: DC | PRN

## 2021-10-25 MED ORDER — MIDAZOLAM HCL 2 MG/2ML IJ SOLN
INTRAMUSCULAR | Status: AC
  Filled 2021-10-25: qty 2

## 2021-10-25 MED ORDER — MORPHINE SULFATE (PF) 2 MG/ML IV SOLN: 2.0000 mg | INTRAVENOUS | Status: DC | PRN

## 2021-10-25 MED ORDER — SODIUM CHLORIDE 0.9 % IV SOLN: INTRAVENOUS | Status: DC

## 2021-10-25 MED ORDER — ONDANSETRON HCL 4 MG/2ML IJ SOLN: 4.0000 mg | Freq: Four times a day (QID) | INTRAMUSCULAR | Status: DC | PRN

## 2021-10-25 SURGICAL SUPPLY — 21 items
BALLN MUSTANG 5X100X135 (BALLOONS) ×4
BALLOON MUSTANG 5X100X135 (BALLOONS) ×1 IMPLANT
CATH CROSS OVER TEMPO 5F (CATHETERS) ×2 IMPLANT
CATH OMNI FLUSH 5F 65CM (CATHETERS) ×2 IMPLANT
CATH SOFT-VU 4F 65 STRAIGHT (CATHETERS) ×1 IMPLANT
CATH SOFT-VU STRAIGHT 4F 65CM (CATHETERS) ×4
CATH SOS OMNI O 5F 80CM (CATHETERS) ×2 IMPLANT
DEVICE VASC CLSR CELT ART 6 (Vascular Products) ×2 IMPLANT
GLIDEWIRE ADV .035X260CM (WIRE) ×2 IMPLANT
KIT ENCORE 26 ADVANTAGE (KITS) ×2 IMPLANT
KIT MICROPUNCTURE NIT STIFF (SHEATH) ×2 IMPLANT
KIT PV (KITS) ×4 IMPLANT
SHEATH PINNACLE 5F 10CM (SHEATH) ×2 IMPLANT
SHEATH PINNACLE 6F 10CM (SHEATH) ×2 IMPLANT
SHEATH PINNACLE ST 6F 45CM (SHEATH) ×2 IMPLANT
STENT ELUVIA 6X100X130 (Permanent Stent) IMPLANT
STENT ELUVIA 6X150X130 (Permanent Stent) ×4 IMPLANT
SYR MEDRAD MARK V 150ML (SYRINGE) ×2 IMPLANT
TRANSDUCER W/STOPCOCK (MISCELLANEOUS) ×4 IMPLANT
TRAY PV CATH (CUSTOM PROCEDURE TRAY) ×4 IMPLANT
WIRE BENTSON .035X145CM (WIRE) ×2 IMPLANT

## 2021-10-25 NOTE — Discharge Instructions (Signed)
Start eliquis tomorrow

## 2021-10-25 NOTE — Progress Notes (Signed)
Patient left groin site continues to ooze, has saturated 2 dressing, held pressure for 15 minutes.  Notified Dr Trula Slade, will continue to monitor.

## 2021-10-25 NOTE — H&P (Signed)
[facility]ist of [facility]   Patient name: Ryan Pittman          MRN: [id number]        DOB: 1941-04-20          Sex: male     REASON FOR VISIT:      Follow-up   HISOTRY OF PRESENT ILLNESS:      Ryan Pittman is a 81 y.o. male who returns today for follow-up.  He has a history of a left femoral to below-knee popliteal bypass graft by Dr. Lucky Cowboy in [place].  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  On 02/13/2020 he underwent right iliofemoral endarterectomy with bovine patch angioplasty.  A long 8 cm patch was not required.  And October 2021 he underwent angiography for recurrent claudication symptoms and had drug-coated balloon angioplasty of the right external iliac and common femoral arteries as well as atherectomy of the right superficial femoral artery.   He continues to complain of some right leg claudication symptoms.  In addition he has had blood pressure issues recently.  He has a known renal artery stenosis.   Patient has a history of hypertension which is medically managed.  He is on a statin for hypercholesterolemia.  He is status post CABG for coronary artery disease in 2019.  He was recently started on Eliquis for atrial fibrillation.  He does have a history of GI bleed PAST MEDICAL HISTORY:        Past Medical History:  Diagnosis Date   Anemia      due to GIB, s/p transfusion   Anginal pain ([place]) 07/10/2018   Arthritis      back pain, much worse after consecutive golf rounds   Cancer [facility])      thyroid   Chronic kidney disease     Colon polyps     Coronary artery disease     COVID-19     Dyspnea      walking up a hill   Dysrhythmia 2020   GERD (gastroesophageal reflux disease)     Hypercalcemia      h/o, resolved as of 2012, prev due to high amount of calcium intake   Hyperlipidemia     Hypertension     Hypothyroidism      IgG gammopathy      stable as of 2012 per [facility]   Pneumonia      as a child   PVD (peripheral vascular disease) ([place])      L leg bypass, R leg stented        FAMILY HISTORY:         Family History  Problem Relation Age of Onset   Diabetes Mother     Stroke Father     Colon cancer Neg Hx     Prostate cancer Neg Hx        SOCIAL HISTORY:    Social History         Tobacco Use   Smoking status: Former      Types: Cigarettes      Quit date: 09/04/2008      Years since quitting: 13.0   Smokeless tobacco: Former      Types: Chew   Tobacco comments:      HAs quit tobacco products 2009  Substance Use Topics   Alcohol use: Not Currently      Alcohol/week: 0.0 standard  drinks        ALLERGIES:         Allergies  Allergen Reactions   Penicillins Itching and Other (See Comments)      PATIENT HAS HAD A PCN REACTION WITH IMMEDIATE RASH, FACIAL/TONGUE/THROAT SWELLING, SOB, OR LIGHTHEADEDNESS WITH HYPOTENSION:  #  #  YES  #  #  Has patient had a PCN reaction causing severe rash involving mucus membranes or skin necrosis: No Has patient had a PCN reaction that required hospitalization: No Has patient had a PCN reaction occurring within the last 10 years: No If all of the above answers are "NO", then may proceed with Cephalosporin use.     Ace Inhibitors Cough   Aspirin Other (See Comments)      H/o GI bleed   Celebrex [Celecoxib] Other (See Comments)      GI bleed   Lipitor [Atorvastatin] Other (See Comments)      myalgias        CURRENT MEDICATIONS:          Current Outpatient Medications  Medication Sig Dispense Refill   amLODipine (NORVASC) 10 MG tablet Take 1 tablet (10 mg total) by mouth daily.       apixaban (ELIQUIS) 2.5 MG TABS tablet Take 1 tablet (2.5 mg total) by mouth 2 (two) times daily. 60 tablet 0   apixaban (ELIQUIS) 2.5 MG TABS tablet Take 1 tablet (2.5 mg total) by mouth 2 (two) times daily. 180 tablet 1   aspirin EC 81 MG tablet Take 81 mg by  mouth daily. Swallow whole.       doxycycline (VIBRA-TABS) 100 MG tablet Take 1 tablet (100 mg total) by mouth 2 (two) times daily. 14 tablet 0   levothyroxine (SYNTHROID) 75 MCG tablet TAKE 1 TABLET BY MOUTH DAILY EXCEPT ON THURSDAYS TAKE 1.5 TABLETS 100 tablet 3   losartan (COZAAR) 100 MG tablet Take 1 tablet (100 mg total) by mouth daily.       metoprolol tartrate (LOPRESSOR) 25 MG tablet Take 0.5 tablets (12.5 mg total) by mouth 2 (two) times daily. 60 tablet 3   omeprazole (PRILOSEC) 20 MG capsule Take 1 capsule (20 mg total) by mouth daily. 30 capsule 6   pramipexole (MIRAPEX) 1 MG tablet Take 1 tablet (1 mg total) by mouth daily.       REPATHA SURECLICK 333 MG/ML SOAJ INJECT 140MG  EVERY 14 DAYS 2 mL 11   sulfamethoxazole-trimethoprim (BACTRIM) 400-80 MG tablet Take 1 tablet by mouth 3 (three) times a week.       tamsulosin (FLOMAX) 0.4 MG CAPS capsule Take 1 capsule (0.4 mg total) by mouth daily. 90 capsule 3   traZODone (DESYREL) 100 MG tablet Take 50-100 mg by mouth at bedtime as needed.        No current facility-administered medications for this visit.      REVIEW OF SYSTEMS:    [X]  denotes positive finding, [ ]  denotes negative finding Cardiac   Comments:  Chest pain or chest pressure:      Shortness of breath upon exertion:      Short of breath when lying flat:      Irregular heart rhythm:             Vascular      Pain in calf, thigh, or hip brought on by ambulation: x    Pain in feet at night that wakes you up from your sleep:       Blood clot in your veins:  Leg swelling:              Pulmonary      Oxygen at home:      Productive cough:       Wheezing:              Neurologic      Sudden weakness in arms or legs:       Sudden numbness in arms or legs:       Sudden onset of difficulty speaking or slurred speech:      Temporary loss of vision in one eye:       Problems with dizziness:              Gastrointestinal      Blood in stool:       Vomited  blood:              Genitourinary      Burning when urinating:       Blood in urine:             Psychiatric      Major depression:              Hematologic      Bleeding problems:      Problems with blood clotting too easily:             Skin      Rashes or ulcers:             Constitutional      Fever or chills:          PHYSICAL EXAM:       Vitals:    09/23/21 1202  BP: (!) 153/69  Pulse: 70  Resp: 16  Temp: 98 F (36.7 C)  TempSrc: Temporal  SpO2: 97%  Weight: 157 lb (71.2 kg)  Height: 5\' 7"  (1.702 m)      GENERAL: The patient is a well-nourished male, in no acute distress. The vital signs are documented above. CARDIAC: There is a regular rate and rhythm.  VASCULAR: Palpable right pedal pulse, nonpalpable left PULMONARY: Non-labored respirations ABDOMEN: Soft and non-tender  MUSCULOSKELETAL: There are no major deformities or cyanosis. NEUROLOGIC: No focal weakness or paresthesias are detected. SKIN: There are no ulcers or rashes noted. PSYCHIATRIC: The patient has a normal affect.   STUDIES:    Reviewed the following studies: Largest Aortic Diameter: Aortic atherosclerosis 2.4 cm     Renal:     Right: Normal size right kidney. Abnormal right Resistive Index.         Normal cortical thickness of right kidney. Evidence of a >         60% stenosis of the right renal artery. RRV flow present.  Left:  Normal size of left kidney. Abnormal left Resistive Index.         Normal cortical thickness of the left kidney. Evidence of a >         60% stenosis in the left renal artery. LRV flow present.  Mesenteric:  Normal Celiac artery findings. 70 to 99% stenosis in the superior  mesenteric  artery.  MEDICAL ISSUES:    Renal artery stenosis: He has a high-grade lesion in the left renal artery documented by angiography.  Ultrasound today confirms bilateral greater than 60% stenosis.  Because he has known underlying renal insufficiency as well as recently  difficult to control blood pressure, I think it is reasonable to perform angiography to better evaluate his renal  arteries with plans for stenting the left, possibly the right.  PAD: The patient continues to have claudication symptoms in his right leg despite several interventions.  At the same time of his arteriogram for his renal arteries, I will study his right leg, possibly his left leg but plan to intervene on the right leg if indicated.  This will be performed in February.  He will need to be off of his Eliquis.  Because of his bleeding issues, even with intervention I will probably just keep him on Eliquis and aspirin and not add Plavix       [name], IV, MD, FACS [facility] [phone] Pager [phone]

## 2021-10-25 NOTE — Op Note (Signed)
Patient name: Ryan Pittman MRN: [id number] DOB: Oct 27, 1940 Sex: male  10/25/2021 Pre-operative Diagnosis: #1: Renal artery stenosis.  #2: Right leg claudication with in-stent stenosis Post-operative diagnosis:  Same Surgeon:  [name] Procedure Performed:  1.  Ultrasound-guided access, left femoral artery  2.  Abdominal aortogram  3.  Bilateral lower extremity runoff  4.  First order catheterization x2 (right and left renal artery with selective renal artery angiogram  5.  Stent, right superficial femoral artery  6.  Conscious sedation, 76 minutes  7.  Closure device, Celt   Indications: This is a 81 year old gentleman who I am following for bilateral lower extremity issues.  He is undergone left femoral-popliteal bypass graft.  He is also undergone right iliac stenting and right femoral endarterectomy with patch angioplasty.  He has a prior history of right superficial femoral artery stenting.  He is having difficult to control blood pressure with renal insufficiency.  Ultrasound suggested bilateral renal artery stenosis.  He comes in today for renal angiogram.  In addition there is concerned about the symptoms in his right leg with claudication and stenosis within his prior interventions that threaten long-term patency of his leg.  He is here for further evaluation and possible intervention.  Procedure:  The patient was identified in the holding area and taken to room 8.  The patient was then placed supine on the table and prepped and draped in the usual sterile fashion.  A time out was called.  Conscious sedation was administered with the use of IV fentanyl and Versed under continuous physician and nurse monitoring.  Heart rate, blood pressure, and oxygen saturation were continuously monitored.  Total sedation time was 76 minutes.  Ultrasound was used to evaluate the left common femoral artery.  It was patent .  A digital ultrasound image was acquired.  A micropuncture needle was  used to access the left common femoral artery under ultrasound guidance.  An 018 wire was advanced without resistance and a micropuncture sheath was placed.  The 018 wire was removed and a benson wire was placed.  The micropuncture sheath was exchanged for a 5 french sheath.  An omniflush catheter was advanced over the wire to the level of L-1.  An abdominal angiogram was obtained.  Next, the left renal artery was cannulated with a crossover catheter and a arteriogram was performed that did not show hemodynamically significant stenosis.  I advanced the wire out into the renal artery and then a 4 French straight catheter and did a pullback pressure.  The gradient was 9 mm.  Next, the right renal artery was selected with a sauce catheter and a right renal angiogram was performed that did not show any significant right renal artery stenosis.  Findings:   Aortogram: No significant renal artery stenosis as documented above.  There was a 9 mm gradient across the left renal ostium.  The infrarenal abdominal aorta is widely patent.  Bilateral common extrailiac arteries are widely patent.  Right Lower Extremity: Patulous dilatation of the right common femoral artery consistent with prior patch angioplasty.  There is diffuse stenosis throughout the right superficial femoral artery with several areas greater than 70% including an in-stent stenosis.  There is a moderate stenosis of the popliteal artery at the level of the patella.  The below-knee popliteal artery is widely patent.  There is two-vessel runoff via the posterior tibial and peroneal artery which have mild to moderate disease.   Left Lower Extremity: Left common femoral profundofemoral  artery widely patent.  The superficial femoral artery is occluded.  There is a bypass graft originating from the common femoral artery.  The proximal anastomosis is widely patent.  The bypass graft itself is widely patent.  The distal anastomosis is to the below-knee popliteal  artery.  There is no anastomotic stenosis.  Distal runoff was not continued due to contrast concerns.  Intervention: After the above images were acquired, the decision made to proceed with intervention on the right leg.  A Glidewire advantage was used to cross the lesion into the popliteal artery.  A 6 French 45 cm sheath was advanced over the bifurcation and placed in the right common femoral artery.  The patient was fully heparinized.  I elected to primary stent these lesions.  I selected a 6 x 150 Elluvia stent and deployed just distal to the prior stent.  A second 6 x 150 stent was deployed up to the origin of the superficial femoral artery.  These were postdilated with a 5 mm balloon.  Completion imaging revealed resolution of the stenosis with no change in runoff.  At this point, I elected to terminate the procedure.  The left groin was closed with a Celt.  Impression:  #1  No significant renal artery stenosis was identified today.  #2  Multiple greater than 60% lesions identified within the right superficial femoral artery treated with overlapping 6 x 150 Elluvia stents.  #3  The left femoral-popliteal bypass graft is widely patent  #4  The patient will not be started on dual antiplatelet therapy as he is taking Eliquis for atrial fibrillation as well as aspirin.  In addition, he has a statin allergy and is being evaluated for Repatha so statin therapy was not initiated today   V. [name], M.D., [facility] [place] Office: [phone] Pager:  [phone]

## 2021-10-26 ENCOUNTER — Encounter (HOSPITAL_COMMUNITY): Payer: Self-pay | Admitting: Surgery

## 2021-11-02 DIAGNOSIS — M5442 Lumbago with sciatica, left side: Secondary | ICD-10-CM | POA: Diagnosis not present

## 2021-11-02 DIAGNOSIS — M48062 Spinal stenosis, lumbar region with neurogenic claudication: Secondary | ICD-10-CM | POA: Diagnosis not present

## 2021-11-02 DIAGNOSIS — G8929 Other chronic pain: Secondary | ICD-10-CM | POA: Diagnosis not present

## 2021-11-07 ENCOUNTER — Ambulatory Visit (INDEPENDENT_AMBULATORY_CARE_PROVIDER_SITE_OTHER): Payer: Medicare Other | Admitting: Urology

## 2021-11-07 ENCOUNTER — Ambulatory Visit (INDEPENDENT_AMBULATORY_CARE_PROVIDER_SITE_OTHER): Payer: Medicare Other | Admitting: Family Medicine

## 2021-11-07 ENCOUNTER — Encounter: Payer: Self-pay | Admitting: Urology

## 2021-11-07 ENCOUNTER — Other Ambulatory Visit: Payer: Self-pay

## 2021-11-07 ENCOUNTER — Telehealth: Payer: Self-pay

## 2021-11-07 ENCOUNTER — Encounter: Payer: Self-pay | Admitting: Family Medicine

## 2021-11-07 VITALS — BP 162/60 | HR 72 | Ht 67.0 in | Wt 154.0 lb

## 2021-11-07 VITALS — BP 142/60 | HR 76 | Temp 97.2°F | Ht 67.0 in | Wt 158.0 lb

## 2021-11-07 DIAGNOSIS — R195 Other fecal abnormalities: Secondary | ICD-10-CM | POA: Diagnosis not present

## 2021-11-07 DIAGNOSIS — D649 Anemia, unspecified: Secondary | ICD-10-CM

## 2021-11-07 DIAGNOSIS — R3914 Feeling of incomplete bladder emptying: Secondary | ICD-10-CM | POA: Diagnosis not present

## 2021-11-07 DIAGNOSIS — I701 Atherosclerosis of renal artery: Secondary | ICD-10-CM | POA: Diagnosis not present

## 2021-11-07 DIAGNOSIS — N401 Enlarged prostate with lower urinary tract symptoms: Secondary | ICD-10-CM

## 2021-11-07 DIAGNOSIS — D62 Acute posthemorrhagic anemia: Secondary | ICD-10-CM

## 2021-11-07 LAB — CBC WITH DIFFERENTIAL/PLATELET
Basophils Absolute: 0.2 10*3/uL — ABNORMAL HIGH (ref 0.0–0.1)
Basophils Relative: 1.1 % (ref 0.0–3.0)
Eosinophils Absolute: 0.3 10*3/uL (ref 0.0–0.7)
Eosinophils Relative: 1.8 % (ref 0.0–5.0)
HCT: 28.8 % — ABNORMAL LOW (ref 39.0–52.0)
Hemoglobin: 9.8 g/dL — ABNORMAL LOW (ref 13.0–17.0)
Lymphocytes Relative: 13.2 % (ref 12.0–46.0)
Lymphs Abs: 2 10*3/uL (ref 0.7–4.0)
MCHC: 33.9 g/dL (ref 30.0–36.0)
MCV: 91.4 fl (ref 78.0–100.0)
Monocytes Absolute: 0.8 10*3/uL (ref 0.1–1.0)
Monocytes Relative: 5.1 % (ref 3.0–12.0)
Neutro Abs: 11.7 10*3/uL — ABNORMAL HIGH (ref 1.4–7.7)
Neutrophils Relative %: 78.8 % — ABNORMAL HIGH (ref 43.0–77.0)
Platelets: 317 10*3/uL (ref 150.0–400.0)
RBC: 3.15 Mil/uL — ABNORMAL LOW (ref 4.22–5.81)
RDW: 18.7 % — ABNORMAL HIGH (ref 11.5–15.5)
WBC: 14.8 10*3/uL — ABNORMAL HIGH (ref 4.0–10.5)

## 2021-11-07 LAB — COMPREHENSIVE METABOLIC PANEL
ALT: 7 U/L (ref 0–53)
AST: 13 U/L (ref 0–37)
Albumin: 4.1 g/dL (ref 3.5–5.2)
Alkaline Phosphatase: 38 U/L — ABNORMAL LOW (ref 39–117)
BUN: 19 mg/dL (ref 6–23)
CO2: 26 mEq/L (ref 19–32)
Calcium: 9 mg/dL (ref 8.4–10.5)
Chloride: 102 mEq/L (ref 96–112)
Creatinine, Ser: 1.11 mg/dL (ref 0.40–1.50)
GFR: 61.78 mL/min (ref >60.00)
Glucose, Bld: 93 mg/dL (ref 70–99)
Potassium: 4.3 mEq/L (ref 3.5–5.1)
Sodium: 138 mEq/L (ref 135–145)
Total Bilirubin: 0.4 mg/dL (ref 0.2–1.2)
Total Protein: 6.2 g/dL (ref 6.0–8.3)

## 2021-11-07 LAB — IRON: Iron: 57 ug/dL (ref 42–165)

## 2021-11-07 LAB — BLADDER SCAN AMB NON-IMAGING: Scan Result: 98

## 2021-11-07 MED ORDER — TAMSULOSIN HCL 0.4 MG PO CAPS: 0.4000 mg | ORAL_CAPSULE | Freq: Every day | ORAL | 3 refills | Status: DC

## 2021-11-07 MED ORDER — AMLODIPINE BESYLATE 5 MG PO TABS: 5.0000 mg | ORAL_TABLET | Freq: Every day | ORAL | Status: DC

## 2021-11-07 NOTE — Telephone Encounter (Signed)
Patient's daughter inlaw [name] is requesting to schedule NP est care appointment. Ryan Pittman MRN [id number]. She was advise she would be able to scheduled.Please advise NP est care.

## 2021-11-07 NOTE — Progress Notes (Signed)
11/07/2021 11:32 AM   [name] 01-Feb-1941 [id number]  Referring provider: Tonia Ghent, MD [address] [place],  [place] [address]  Chief Complaint  Patient presents with   Benign Prostatic Hypertrophy     Urologic history: 1.  BPH with LUTS -Bothersome lower urinary tract symptoms 09/2018; PVR 119 mL -Started tamsulosin with improvement  HPI: 81 y.o. male presents for annual follow-up.  Stable LUTS since last years visit Remains on tamsulosin Denies dysuria, gross hematuria Denies flank, abdominal or pelvic pain   PMH: Past Medical History:  Diagnosis Date   Anemia    due to GIB, s/p transfusion   Anginal pain ([place]) 07/10/2018   Arthritis    back pain, much worse after consecutive golf rounds   Cancer [facility])    thyroid   Chronic kidney disease    Colon polyps    Coronary artery disease    COVID-19    Dyspnea    walking up a hill   Dysrhythmia 2020   GERD (gastroesophageal reflux disease)    Hypercalcemia    h/o, resolved as of 2012, prev due to high amount of calcium intake   Hyperlipidemia    Hypertension    Hypothyroidism    IgG gammopathy    stable as of 2012 per [facility]   Pneumonia    as a child   PVD (peripheral vascular disease) ([place])    L leg bypass, R leg stented    Surgical History: Past Surgical History:  Procedure Laterality Date    dental implant left bottom-did have bleeding      ABDOMINAL AORTOGRAM W/LOWER EXTREMITY Bilateral 06/26/2019   Procedure: ABDOMINAL AORTOGRAM W/LOWER EXTREMITY;  Surgeon: [name], MD;  Location: [facility];  Service: Cardiovascular;  Laterality: Bilateral;   ABDOMINAL AORTOGRAM W/LOWER EXTREMITY N/A 06/08/2020   Procedure: ABDOMINAL AORTOGRAM W/LOWER EXTREMITY;  Surgeon: Serafina Mitchell, MD;  Location: [place] [facility];  Service: Cardiovascular;  Laterality: N/A;   BYPASS GRAFT     L leg   CARDIAC CATHETERIZATION  06/24/2018   COLONOSCOPY N/A 10/09/2021   Procedure:  COLONOSCOPY;  Surgeon: Toledo, Benay Pike, MD;  Location: [facility] ENDOSCOPY;  Service: Gastroenterology;  Laterality: N/A;   COLONOSCOPY WITH PROPOFOL N/A 05/04/2015   Procedure: COLONOSCOPY WITH PROPOFOL;  Surgeon: Lollie Sails, MD;  Location: [facility] ENDOSCOPY;  Service: Endoscopy;  Laterality: N/A;   CORONARY ARTERY BYPASS GRAFT N/A 07/11/2018   Procedure: CORONARY ARTERY BYPASS GRAFTING (CABG) x three, using left internal mammary artery and right leg greater saphenous vein harvested endoscopically;  Surgeon: Gaye Pollack, MD;  Location: [place] OR;  Service: Open Heart Surgery;  Laterality: N/A;   ENDARTERECTOMY FEMORAL Right 02/13/2020   Procedure: RIGHT ENDARTERECTOMY FEMORAL WITH BOVINE PATCH;  Surgeon: Serafina Mitchell, MD;  [facility];  Service: Vascular;  Laterality: Right;   ESOPHAGOGASTRODUODENOSCOPY N/A 10/09/2021   Procedure: ESOPHAGOGASTRODUODENOSCOPY (EGD);  Surgeon: Toledo, Benay Pike, MD;  Location: [facility] ENDOSCOPY;  Service: Gastroenterology;  Laterality: N/A;   GIVENS CAPSULE STUDY N/A 10/09/2021   Procedure: GIVENS CAPSULE STUDY;  Surgeon: Toledo, Benay Pike, MD;  Location: [facility] ENDOSCOPY;  Service: Gastroenterology;  Laterality: N/A;   LEFT HEART CATH AND CORONARY ANGIOGRAPHY N/A 06/24/2018   Procedure: LEFT HEART CATH AND CORONARY ANGIOGRAPHY;  Surgeon: [name], MD;  Location: [place] [facility];  Service: Cardiovascular;  Laterality: N/A;   LOWER EXTREMITY ANGIOGRAPHY Bilateral 10/25/2021   Procedure: Lower Extremity Angiography;  Surgeon: Trula Slade,  Butch Penny, MD;  Location: [place] [facility];  Service: Cardiovascular;  Laterality: Bilateral;   PERIPHERAL VASCULAR ATHERECTOMY Right 06/08/2020   Procedure: PERIPHERAL VASCULAR ATHERECTOMY;  Surgeon: Serafina Mitchell, MD;  Location: [place] [facility];  Service: Cardiovascular;  Laterality: Right;  superficial femoral   PERIPHERAL VASCULAR BALLOON ANGIOPLASTY Right 06/08/2020   Procedure: PERIPHERAL VASCULAR BALLOON ANGIOPLASTY;   Surgeon: Serafina Mitchell, MD;  Location: [place] [facility];  Service: Cardiovascular;  Laterality: Right;  External iliac   PERIPHERAL VASCULAR INTERVENTION Right 10/25/2021   Procedure: PERIPHERAL VASCULAR INTERVENTION;  Surgeon: Serafina Mitchell, MD;  Location: [place] [facility];  Service: Cardiovascular;  Laterality: Right;  SFA   RENAL ANGIOGRAPHY Bilateral 10/25/2021   Procedure: RENAL ANGIOGRAPHY;  Surgeon: Serafina Mitchell, MD;  Location: [place] [facility];  Service: Cardiovascular;  Laterality: Bilateral;   TEE WITHOUT CARDIOVERSION N/A 07/11/2018   Procedure: TRANSESOPHAGEAL ECHOCARDIOGRAM (TEE);  Surgeon: Gaye Pollack, MD;  Location: [facility];  Service: Open Heart Surgery;  Laterality: N/A;   THYROIDECTOMY, PARTIAL  2016   VASCULAR SURGERY      Home Medications:  Allergies as of 11/07/2021       Reactions   Penicillins Itching, Other (See Comments)   PATIENT HAS HAD A PCN REACTION WITH IMMEDIATE RASH, FACIAL/TONGUE/THROAT SWELLING, SOB, OR LIGHTHEADEDNESS WITH HYPOTENSION:  #  #  YES  #  #  Has patient had a PCN reaction causing severe rash involving mucus membranes or skin necrosis: No Has patient had a PCN reaction that required hospitalization: No Has patient had a PCN reaction occurring within the last 10 years: No If all of the above answers are "NO", then may proceed with Cephalosporin use.   Ace Inhibitors Cough   Aspirin Other (See Comments)   H/o GI bleed   Celebrex [celecoxib] Other (See Comments)   GI bleed   Lipitor [atorvastatin] Other (See Comments)   myalgias        Medication List        Accurate as of November 07, 2021 11:32 AM. If you have any questions, ask your nurse or doctor.          amLODipine 10 MG tablet Commonly known as: NORVASC Take 0.5-1 tablets (5-10 mg total) by mouth daily.   apixaban 2.5 MG Tabs tablet Commonly known as: ELIQUIS Take 1 tablet (2.5 mg total) by mouth 2 (two) times daily.   aspirin EC 81 MG tablet Take 81 mg by  mouth daily. Swallow whole.   cholecalciferol 25 MCG (1000 UNIT) tablet Commonly known as: VITAMIN D3 Take 1,000 Units by mouth daily.   levothyroxine 75 MCG tablet Commonly known as: SYNTHROID TAKE 1 TABLET BY MOUTH DAILY EXCEPT ON THURSDAYS TAKE 1.5 TABLETS   losartan 100 MG tablet Commonly known as: COZAAR Take 1 tablet (100 mg total) by mouth daily. What changed: when to take this   metoprolol tartrate 25 MG tablet Commonly known as: LOPRESSOR Take 0.5 tablets (12.5 mg total) by mouth 2 (two) times daily.   omeprazole 20 MG capsule Commonly known as: PRILOSEC Take 1 capsule (20 mg total) by mouth daily. What changed:  when to take this reasons to take this   pramipexole 1 MG tablet Commonly known as: MIRAPEX Take 1 tablet (1 mg total) by mouth daily. What changed:  when to take this reasons to take this   Repatha SureClick 389 MG/ML So[place] Generic drug: Evolocumab INJECT '140MG'$  EVERY 14 DAYS   sulfamethoxazole-trimethoprim  400-80 MG tablet Commonly known as: BACTRIM Take 1 tablet by mouth 3 (three) times a week.   tamsulosin 0.4 MG Caps capsule Commonly known as: FLOMAX Take 1 capsule (0.4 mg total) by mouth daily.   vitamin C 1000 MG tablet Take 1,000 mg by mouth daily.        Allergies:  Allergies  Allergen Reactions   Penicillins Itching and Other (See Comments)    PATIENT HAS HAD A PCN REACTION WITH IMMEDIATE RASH, FACIAL/TONGUE/THROAT SWELLING, SOB, OR LIGHTHEADEDNESS WITH HYPOTENSION:  #  #  YES  #  #  Has patient had a PCN reaction causing severe rash involving mucus membranes or skin necrosis: No Has patient had a PCN reaction that required hospitalization: No Has patient had a PCN reaction occurring within the last 10 years: No If all of the above answers are "NO", then may proceed with Cephalosporin use.    Ace Inhibitors Cough   Aspirin Other (See Comments)    H/o GI bleed   Celebrex [Celecoxib] Other (See Comments)    GI bleed   Lipitor  [Atorvastatin] Other (See Comments)    myalgias    Family History: Family History  Problem Relation Age of Onset   Diabetes Mother    Stroke Father    Colon cancer Neg Hx    Prostate cancer Neg Hx     Social History:  reports that he quit smoking about 13 years ago. His smoking use included cigarettes. He has quit using smokeless tobacco.  His smokeless tobacco use included chew. He reports current alcohol use. He reports that he does not use drugs.   Physical Exam: BP (!) 162/60    Pulse 72    Ht '5\' 7"'$  (1.702 m)    Wt 154 lb (69.9 kg)    BMI 24.12 kg/m   Constitutional:  Alert and oriented, No acute distress. HEENT: [place] AT, moist mucus membranes.  Trachea midline, no masses. Cardiovascular: No clubbing, cyanosis, or edema. Respiratory: Normal respiratory effort, no increased work of breathing. Psychiatric: Normal mood and affect.   Assessment & Plan:    1. Benign prostatic hyperplasia with lower urinary tract symptoms Bladder scan PVR stable at 98 mL Stable LUTS on tamsulosin Refill sent to pharmacy Continue annual follow-up   Abbie Sons, [place] [address] [place], [place] [address] [phone]

## 2021-11-07 NOTE — Progress Notes (Signed)
This visit occurred during the SARS-CoV-2 public health emergency.  Safety protocols were in place, including screening questions prior to the visit, additional usage of staff PPE, and extensive cleaning of exam room while observing appropriate contact time as indicated for disinfecting solutions. ? ?We talked about prev anemia.  His hemoglobin was much lower than I was expecting at the last office visit and he required inpatient treatment with transfusion.  He improved enough to where he can be discharged.  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  ? ?He had vascular eval done after after inpatient tx.   ? ?Impression: ? ?            #1  No significant renal artery stenosis was identified today. ? ?            #2  Multiple greater than 60% lesions identified within the right superficial femoral artery treated with overlapping 6 x 150 Elluvia stents. ? ?            #3  The left femoral-popliteal bypass graft is widely patent ? ?            #4  The patient will not be started on dual antiplatelet therapy as he is taking Eliquis for atrial fibrillation as well as aspirin.  In addition, he has a statin allergy and is being evaluated for Repatha so statin therapy was not initiated today ? ?========================== ?Currently on eliquis 2.'5mg'$  BID.  Taking aspirin '81mg'$ .  No black stools today but recently darker.  He had black stools prior to prev admission.  He doesn't look pale, as he did when he previously had significant anemia prior to hospitalization. ? ?He clearly feels better compared to his status prior to admission. ? ?Meds, vitals, and allergies reviewed.  ? ?ROS: Per HPI unless specifically indicated in ROS section  ? ?GEN: nad, alert and oriented ?HEENT: ncat ?NECK: supple w/o LA ?CV: rrr. SEM murmur  ?PULM: ctab, no inc wob ?ABD: soft, +bs ?EXT: no edema ?SKIN: Well-perfused. ?

## 2021-11-07 NOTE — Telephone Encounter (Signed)
Dr. Damita Dunnings is okay with seeing patient. Please schedule patient whenever possible in the schedule  ?

## 2021-11-07 NOTE — Patient Instructions (Signed)
If your stools get darker, if you have any black stools, or if you have more fatigue, then let us know.  ?Take care.  Glad to see you. ?Go to the lab on the way out.   If you have mychart we'll likely use that to update you.    ?

## 2021-11-09 ENCOUNTER — Other Ambulatory Visit: Payer: Self-pay | Admitting: Family Medicine

## 2021-11-09 DIAGNOSIS — D62 Acute posthemorrhagic anemia: Secondary | ICD-10-CM

## 2021-11-09 NOTE — Assessment & Plan Note (Signed)
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  He is back on Eliquis 2.5 mg twice daily along with aspirin 81 mg a day.  Recheck labs pending.  He does not have black stools like he did prior to admission but his stools have been darker recently.  He does not look pale and he does not feel unwell.  At this point still okay for outpatient follow-up.  See notes on labs. ?

## 2021-11-10 ENCOUNTER — Other Ambulatory Visit (INDEPENDENT_AMBULATORY_CARE_PROVIDER_SITE_OTHER): Payer: Medicare Other

## 2021-11-10 ENCOUNTER — Telehealth: Payer: Self-pay

## 2021-11-10 ENCOUNTER — Other Ambulatory Visit: Payer: Self-pay

## 2021-11-10 ENCOUNTER — Other Ambulatory Visit: Payer: Self-pay | Admitting: Family Medicine

## 2021-11-10 DIAGNOSIS — D62 Acute posthemorrhagic anemia: Secondary | ICD-10-CM

## 2021-11-10 LAB — CBC WITH DIFFERENTIAL/PLATELET
Basophils Absolute: 0.2 10*3/uL — ABNORMAL HIGH (ref 0.0–0.1)
Basophils Relative: 1.3 % (ref 0.0–3.0)
Eosinophils Absolute: 0.3 10*3/uL (ref 0.0–0.7)
Eosinophils Relative: 2.3 % (ref 0.0–5.0)
HCT: 28.3 % — ABNORMAL LOW (ref 39.0–52.0)
Hemoglobin: 9.4 g/dL — ABNORMAL LOW (ref 13.0–17.0)
Lymphocytes Relative: 13.2 % (ref 12.0–46.0)
Lymphs Abs: 1.7 10*3/uL (ref 0.7–4.0)
MCHC: 33.2 g/dL (ref 30.0–36.0)
MCV: 91.4 fl (ref 78.0–100.0)
Monocytes Absolute: 0.8 10*3/uL (ref 0.1–1.0)
Monocytes Relative: 6.3 % (ref 3.0–12.0)
Neutro Abs: 9.9 10*3/uL — ABNORMAL HIGH (ref 1.4–7.7)
Neutrophils Relative %: 76.9 % (ref 43.0–77.0)
Platelets: 297 10*3/uL (ref 150.0–400.0)
RBC: 3.1 Mil/uL — ABNORMAL LOW (ref 4.22–5.81)
RDW: 19.1 % — ABNORMAL HIGH (ref 11.5–15.5)
WBC: 12.9 10*3/uL — ABNORMAL HIGH (ref 4.0–10.5)

## 2021-11-10 NOTE — Telephone Encounter (Signed)
Please see my note about the prev HGB level.  I question if that is a false elevation.  We can recheck HGB again today at a lab visit if needed.  Thanks.  ?

## 2021-11-10 NOTE — Telephone Encounter (Signed)
D/w pt at lab visit.  He feels well.  Reasonable to recheck CBC today and go from there with routine cautions given to patient.  Thanks.  ?

## 2021-11-10 NOTE — Telephone Encounter (Signed)
Please see notes below the access nurse note.per access note no symptoms other than stools being darker. Sending note to Dr Damita Dunnings and Janett Billow CMA and will teams [facility]. ? ? ?[facility] - Client ?TELEPHONE ADVICE RECORD ?AccessNurse? ?Patient ?Name: ?Ryan Pittman ?Gender: Male ?DOB: 1941/01/17 ?Age: 81 Y 15 M 25 D ?Return ?Phone ?Number: ?[id number] ?(Primary), ?[id number] ?(Secondary) ?Address: ?City/ ?State/ ?Zip: ?[place] ? [address] ?Client [place] [facility] - Client ?Client Site [place] - Day ?Provider Renford Dills - MD ?Contact Type Call ?Who Is Calling Patient / Member / Family / Caregiver ?Call Type Triage / Clinical ?Caller Name Donzell Coller ?Relationship To Patient Spouse ?Return Phone Number [phone] (Secondary) ?Chief Complaint Health information question (non symptomatic) ?Reason for Call Symptomatic / Request for Health Information ?Initial Comment Caller states husband's hemoglobin was 9.8 on ?Monday (results on mychart). Denies symptoms ?Translation No ?Nurse Assessment ?Nurse: Doyle Askew, RN, [place] Date/Time (Eastern Time): 11/10/2021 9:31:00 AM ?Confirm and document reason for call. If ?symptomatic, describe symptoms. ?---Caller states husband's hemoglobin was 9.8 on ?Tuesday (results on mychart), denies symptoms. Caller ?states spouse saw Dr on Monday - f/u visit from being ?in [place] for blood transfusions. Caller states spouse is ?showing no symptoms except stool being darker. ?Does the patient have any new or worsening ?symptoms? ---Yes ?Will a triage be completed? ---Yes ?Related visit to physician within the last 2 weeks? ---Yes ?Does the PT have any chronic conditions? (i.e. ?diabetes, asthma, this includes High risk factors for ?pregnancy, etc.) ?---Yes ?List chronic conditions. ---5 heart bypasses, surgery on 21st for kidney ?blockages ?Is this a behavioral health or substance abuse call? ---No ?Guidelines ?Guideline  Title Affirmed Question Affirmed Notes Nurse Date/Time (Eastern ?Time) ?Weakness ?(Generalized) and ?Fatigue ?[1] MODERATE ?weakness (i.e., ?interferes with work, ?school, normal ?activities) AND ?[2] cause unknown ?(Exceptions: ?Doyle Askew, RN, [place] 11/10/2021 9:38:53 AM ?PLEASE NOTE: All timestamps contained within this report are represented as [place] Standard Time. ?CONFIDENTIALTY NOTICE: This fax transmission is intended only for the addressee. It contains information that is legally privileged, confidential or ?otherwise protected from use or disclosure. If you are not the intended recipient, you are strictly prohibited from reviewing, disclosing, copying using ?or disseminating any of this information or taking any action in reliance on or regarding this information. If you have received this fax in error, please ?notify us immediately by telephone so that we can arrange for its return to [place]. Phone: [phone], Toll-Free: [phone], Fax: [phone] ?Page: 2 of 2 ?Call Id: [id number] ?Guidelines ?Guideline Title Affirmed Question Affirmed Notes Nurse Date/Time (Eastern ?Time) ?weakness with acute ?minor illness, or ?weakness from poor ?fluid intake) ?Disp. Time (Eastern ?Time) Disposition Final User ?11/10/2021 9:44:03 AM See HCP within 4 Hours (or ?PCP triage) ?Yes Doyle Askew, RN, [place] ?Caller Disagree/Comply Comply ?Caller Understands Yes ?PreDisposition Call Doctor ?Care Advice Given Per Guideline ?SEE HCP (OR PCP TRIAGE) WITHIN 4 HOURS: * IF OFFICE WILL BE OPEN: You need to be seen within the next 3 or 4 ?hours. Call your doctor (or NP/PA) now or as soon as the office opens. CALL BACK IF: * You become worse CARE ADVICE ?given per Weakness and Fatigue (Adult) guideline. ?Comments ?User: Melene Muller, RN Date/Time Eilene Ghazi Time): 11/10/2021 9:45:06 AM ?Spoke with Dr Carole Civil nurse, Leafy Ro, who took information to relay to Dr Damita Dunnings and stated would be calling pt ?back, relayed this to caller. Caller states  understanding. ?Referrals ?REFERRED TO PCP OFFIC ?

## 2021-11-10 NOTE — Telephone Encounter (Signed)
Access nurse called in on the back line after triaging patient through his wife.  ? ?Wife, Izora Gala, called in to report that she is concerned about her husbands dropping HGB.  She states that in the hospital it was 11, 2 weeks ago, and this week it has dropped to 9.8.   ? ?According to access nurse, patient is not having any symptoms at the present but given the significant drop from 2 weeks ago, they have triaged him out to see the PCP within 4 hours. I took the phone report from the triage nurse with access and will forward it to Dr. Damita Dunnings for advice and we will call patient/wife back shortly with direction.  ? ?I see in the lab note from yesterday that Dr. Damita Dunnings did address and make plans.  Will copy this message to Dr. Damita Dunnings and to his CMA to follow up with patient and wife.  ? ?We will copy and paste in full triage report once it comes through on the portal.  ? ?  ? ? ?

## 2021-11-10 NOTE — Addendum Note (Signed)
Addended by: Carter Kitten on: 11/10/2021 11:44 AM ? ? Modules accepted: Orders ? ?

## 2021-11-10 NOTE — Telephone Encounter (Signed)
I spoke with pt and he said his BM is a normal BM and the color is a darker brown than usual but not black and pt has not seen blood in stool. ?

## 2021-11-10 NOTE — Telephone Encounter (Signed)
Called patient about his lab results. He is feeling fine but okay with coming in today to recheck levels. I schedule appt for today at 11:30am for labs. ?

## 2021-11-11 ENCOUNTER — Other Ambulatory Visit: Payer: Self-pay | Admitting: Family Medicine

## 2021-11-11 DIAGNOSIS — D649 Anemia, unspecified: Secondary | ICD-10-CM

## 2021-11-11 DIAGNOSIS — M48062 Spinal stenosis, lumbar region with neurogenic claudication: Secondary | ICD-10-CM | POA: Diagnosis not present

## 2021-11-11 MED ORDER — ASPIRIN EC 81 MG PO TBEC: DELAYED_RELEASE_TABLET | ORAL | Status: DC

## 2021-11-14 ENCOUNTER — Other Ambulatory Visit: Payer: Self-pay | Admitting: *Deleted

## 2021-11-14 DIAGNOSIS — I739 Peripheral vascular disease, unspecified: Secondary | ICD-10-CM

## 2021-11-14 DIAGNOSIS — I70213 Atherosclerosis of native arteries of extremities with intermittent claudication, bilateral legs: Secondary | ICD-10-CM

## 2021-11-16 ENCOUNTER — Other Ambulatory Visit: Payer: Self-pay

## 2021-11-16 ENCOUNTER — Other Ambulatory Visit (INDEPENDENT_AMBULATORY_CARE_PROVIDER_SITE_OTHER): Payer: Medicare Other

## 2021-11-16 DIAGNOSIS — D649 Anemia, unspecified: Secondary | ICD-10-CM

## 2021-11-16 DIAGNOSIS — D62 Acute posthemorrhagic anemia: Secondary | ICD-10-CM

## 2021-11-16 LAB — CBC WITH DIFFERENTIAL/PLATELET
Basophils Absolute: 0.2 10*3/uL — ABNORMAL HIGH (ref 0.0–0.1)
Basophils Relative: 1.3 % (ref 0.0–3.0)
Eosinophils Absolute: 0.3 10*3/uL (ref 0.0–0.7)
Eosinophils Relative: 2.8 % (ref 0.0–5.0)
HCT: 27.6 % — ABNORMAL LOW (ref 39.0–52.0)
Hemoglobin: 9 g/dL — ABNORMAL LOW (ref 13.0–17.0)
Lymphocytes Relative: 12.8 % (ref 12.0–46.0)
Lymphs Abs: 1.5 10*3/uL (ref 0.7–4.0)
MCHC: 32.4 g/dL (ref 30.0–36.0)
MCV: 93.6 fl (ref 78.0–100.0)
Monocytes Absolute: 0.7 10*3/uL (ref 0.1–1.0)
Monocytes Relative: 5.7 % (ref 3.0–12.0)
Neutro Abs: 9.3 10*3/uL — ABNORMAL HIGH (ref 1.4–7.7)
Neutrophils Relative %: 77.4 % — ABNORMAL HIGH (ref 43.0–77.0)
Platelets: 293 10*3/uL (ref 150.0–400.0)
RBC: 2.95 Mil/uL — ABNORMAL LOW (ref 4.22–5.81)
RDW: 18.7 % — ABNORMAL HIGH (ref 11.5–15.5)
WBC: 12 10*3/uL — ABNORMAL HIGH (ref 4.0–10.5)

## 2021-11-16 LAB — IRON: Iron: 50 ug/dL (ref 42–165)

## 2021-11-16 NOTE — Addendum Note (Signed)
Addended by: Carter Kitten on: 11/16/2021 09:29 AM ? ? Modules accepted: Orders ? ?

## 2021-11-17 ENCOUNTER — Telehealth (HOSPITAL_COMMUNITY): Payer: Self-pay | Admitting: *Deleted

## 2021-11-17 ENCOUNTER — Telehealth: Payer: Self-pay

## 2021-11-17 ENCOUNTER — Other Ambulatory Visit: Payer: Self-pay | Admitting: Family Medicine

## 2021-11-17 LAB — FECAL OCCULT BLOOD, IMMUNOCHEMICAL: Fecal Occult Bld: POSITIVE — AB

## 2021-11-17 NOTE — Telephone Encounter (Signed)
Cecille Rubin called in a critical result positive iFOB. I have spoken to Dr Damita Dunnings. ?

## 2021-11-17 NOTE — Telephone Encounter (Signed)
Close encounter 

## 2021-11-18 ENCOUNTER — Ambulatory Visit (HOSPITAL_COMMUNITY)
Admission: RE | Admit: 2021-11-18 | Discharge: 2021-11-18 | Disposition: A | Discharge status: Home / Self Care | Payer: Medicare Other | Source: Ambulatory Visit | Attending: Internal Medicine | Admitting: Internal Medicine

## 2021-11-18 ENCOUNTER — Other Ambulatory Visit: Payer: Self-pay

## 2021-11-18 DIAGNOSIS — I25119 Atherosclerotic heart disease of native coronary artery with unspecified angina pectoris: Secondary | ICD-10-CM | POA: Insufficient documentation

## 2021-11-18 LAB — MYOCARDIAL PERFUSION IMAGING
Base ST Depression (mm): 0 mm
LV dias vol: 97 mL (ref 62–150)
LV sys vol: 37 mL
Nuc Stress EF: 62 %
Peak HR: 83 {beats}/min
Rest HR: 59 {beats}/min
Rest Nuclear Isotope Dose: 10.9 mCi
SDS: 0
SRS: 1
SSS: 1
ST Depression (mm): 0 mm
Stress Nuclear Isotope Dose: 31.2 mCi
TID: 1.08

## 2021-11-18 MED ORDER — TECHNETIUM TC 99M TETROFOSMIN IV KIT
31.2000 | PACK | Freq: Once | INTRAVENOUS | Status: AC | PRN
  Administered 2021-11-18: 31.2 via INTRAVENOUS
  Filled 2021-11-18: qty 32

## 2021-11-18 MED ORDER — TECHNETIUM TC 99M TETROFOSMIN IV KIT
10.9000 | PACK | Freq: Once | INTRAVENOUS | Status: AC | PRN
  Administered 2021-11-18: 10.9 via INTRAVENOUS
  Filled 2021-11-18: qty 11

## 2021-11-18 MED ORDER — REGADENOSON 0.4 MG/5ML IV SOLN
0.4000 mg | Freq: Once | INTRAVENOUS | Status: AC
  Administered 2021-11-18: 0.4 mg via INTRAVENOUS

## 2021-11-18 NOTE — Telephone Encounter (Signed)
This was expected given his situation.  See prev result notes.  Please notify pt and send a copy of labs to Dr. Alice Reichert with GI.  Would still stop eliquis and aspirin as planned. The same advice applies- if black or bloody stools, if feeling worse, then go to ER.  Thanks.  ?

## 2021-11-18 NOTE — Telephone Encounter (Signed)
Patient aware.

## 2021-11-21 ENCOUNTER — Telehealth: Payer: Self-pay

## 2021-11-21 ENCOUNTER — Other Ambulatory Visit (INDEPENDENT_AMBULATORY_CARE_PROVIDER_SITE_OTHER): Payer: Medicare Other

## 2021-11-21 ENCOUNTER — Other Ambulatory Visit: Payer: Self-pay

## 2021-11-21 DIAGNOSIS — D649 Anemia, unspecified: Secondary | ICD-10-CM | POA: Diagnosis not present

## 2021-11-21 LAB — CBC WITH DIFFERENTIAL/PLATELET
Basophils Absolute: 0.1 10*3/uL (ref 0.0–0.1)
Basophils Relative: 0.8 % (ref 0.0–3.0)
Eosinophils Absolute: 0.3 10*3/uL (ref 0.0–0.7)
Eosinophils Relative: 2.1 % (ref 0.0–5.0)
HCT: 26.4 % — ABNORMAL LOW (ref 39.0–52.0)
Hemoglobin: 8.7 g/dL — ABNORMAL LOW (ref 13.0–17.0)
Lymphocytes Relative: 11.4 % — ABNORMAL LOW (ref 12.0–46.0)
Lymphs Abs: 1.5 10*3/uL (ref 0.7–4.0)
MCHC: 33 g/dL (ref 30.0–36.0)
MCV: 91.8 fl (ref 78.0–100.0)
Monocytes Absolute: 0.9 10*3/uL (ref 0.1–1.0)
Monocytes Relative: 6.7 % (ref 3.0–12.0)
Neutro Abs: 10.1 10*3/uL — ABNORMAL HIGH (ref 1.4–7.7)
Neutrophils Relative %: 79 % — ABNORMAL HIGH (ref 43.0–77.0)
Platelets: 270 10*3/uL (ref 150.0–400.0)
RBC: 2.87 Mil/uL — ABNORMAL LOW (ref 4.22–5.81)
RDW: 17.9 % — ABNORMAL HIGH (ref 11.5–15.5)
WBC: 12.8 10*3/uL — ABNORMAL HIGH (ref 4.0–10.5)

## 2021-11-21 NOTE — Telephone Encounter (Signed)
[place] [place] [facility] - Client ?TELEPHONE ADVICE RECORD ?AccessNurse? ?Patient ?Name: ?[name] ?Gender: Male ?DOB: [birth year] ?Age: 81 Y 41 M 8 D ?Return ?Phone ?Number: ?[id number] ?(Secondary) ?Address: ?City/ ?State/ ?Zip: ?[place] ? [address] ?Client [facility] - Client ?Client Site [facility] - Day ?Provider Renford Dills - MD ?Contact Type Call ?Who Is Calling Patient / Member / Family / Caregiver ?Call Type Triage / Clinical ?Caller Name Izora Gala ?Relationship To Patient Spouse ?Return Phone Number [phone] (Secondary) ?Chief Complaint Walking difficulty ?Reason for Call Symptomatic / Request for Health Information ?Initial Comment Caller states her husband is weak, blood levels are ?low, and he can barely walk. ?Translation No ?Nurse Assessment ?Nurse: Doren Custard, RN, Caryl Pina Date/Time (Eastern Time): 11/21/2021 11:50:43 AM ?Confirm and document reason for call. If ?symptomatic, describe symptoms. ---Caller states he is weak and it began yesterday. ?Does the patient have any new or worsening ?symptoms? ---Yes ?Will a triage be completed? ---Yes ?Related visit to physician within the last 2 weeks? ---No ?Does the PT have any chronic conditions? (i.e. ?diabetes, asthma, this includes High risk factors for ?pregnancy, etc.) ?---No ?Is this a behavioral health or substance abuse call? ---No ?Guidelines ?Guideline Title Affirmed Question Affirmed Notes Nurse Date/Time (Eastern ?Time) ?Weakness ?(Generalized) and ?Fatigue ?Black or tarry bowel ?movements ?Doren Custard, RN, Caryl Pina 11/21/2021 11:51:25 ?AM ?Disp. Time (Eastern ?Time) Disposition Final User ?11/21/2021 11:56:07 AM Go to ED Now Yes Doren Custard, RN, Caryl Pina ?Caller Disagree/Comply Comply ?PLEASE NOTE: All timestamps contained within this report are represented as [place] Standard Time. ?CONFIDENTIALTY NOTICE: This fax transmission is intended only for the addressee. It contains information that is  legally privileged, confidential or ?otherwise protected from use or disclosure. If you are not the intended recipient, you are strictly prohibited from reviewing, disclosing, copying using ?or disseminating any of this information or taking any action in reliance on or regarding this information. If you have received this fax in error, please ?notify us immediately by telephone so that we can arrange for its return to [place]. Phone: [phone], Toll-Free: [phone], Fax: [phone] ?Page: 2 of 2 ?Call Id: [id number] ?Caller Understands Yes ?PreDisposition Did not know what to do ?Care Advice Given Per Guideline ?GO TO ED NOW: * You need to be seen in the Emergency Department. CARE ADVICE given per Weakness and Fatigue (Adult) ?guideline. BRING MEDICINES: * Bring a list of your current medicines when you go to the Emergency Department (ER). ?Referrals ?GO TO FACILITY UNDECIDE ?

## 2021-11-21 NOTE — Telephone Encounter (Signed)
I spoke with pt; pt said he is feeling better today; pt said he is a little weak but he is not having trouble walking. Pt said today he had a normal BM with No blood and was not dark or black. BP now 130/50 somethng and P 58. Pulse ox 98%. The pt said his wife was at work and he said he is OK now. Pt said he and his wife have different opinions on how he is feeling. UC &ED precautions reviewed and pt voiced understanding and said if weak or sees blood or difficulty walking or vitals change pt will go to ED. I advised pt I spoke with Dr Damita Dunnings.pt said if he worsened at all he would go to ED.  Pt will wait to here back lab results otherwise. Sending note to Dr Damita Dunnings and Janett Billow CMA. ?

## 2021-11-22 ENCOUNTER — Encounter: Payer: Self-pay | Admitting: Family Medicine

## 2021-11-22 ENCOUNTER — Encounter (HOSPITAL_COMMUNITY): Payer: Self-pay

## 2021-11-22 ENCOUNTER — Emergency Department (HOSPITAL_COMMUNITY)
Admission: EM | Admit: 2021-11-22 | Discharge: 2021-11-23 | Disposition: A | Discharge status: Home / Self Care | Payer: Medicare Other | Attending: Emergency Medicine | Admitting: Emergency Medicine

## 2021-11-22 ENCOUNTER — Other Ambulatory Visit: Payer: Self-pay

## 2021-11-22 ENCOUNTER — Telehealth: Payer: Self-pay | Admitting: Family Medicine

## 2021-11-22 ENCOUNTER — Other Ambulatory Visit: Payer: Self-pay | Admitting: Family Medicine

## 2021-11-22 DIAGNOSIS — Z79899 Other long term (current) drug therapy: Secondary | ICD-10-CM | POA: Diagnosis not present

## 2021-11-22 DIAGNOSIS — Z7901 Long term (current) use of anticoagulants: Secondary | ICD-10-CM | POA: Insufficient documentation

## 2021-11-22 DIAGNOSIS — N189 Chronic kidney disease, unspecified: Secondary | ICD-10-CM | POA: Diagnosis not present

## 2021-11-22 DIAGNOSIS — D649 Anemia, unspecified: Secondary | ICD-10-CM

## 2021-11-22 DIAGNOSIS — Z7982 Long term (current) use of aspirin: Secondary | ICD-10-CM | POA: Insufficient documentation

## 2021-11-22 DIAGNOSIS — I480 Paroxysmal atrial fibrillation: Secondary | ICD-10-CM | POA: Diagnosis not present

## 2021-11-22 DIAGNOSIS — D72829 Elevated white blood cell count, unspecified: Secondary | ICD-10-CM | POA: Diagnosis not present

## 2021-11-22 DIAGNOSIS — I2510 Atherosclerotic heart disease of native coronary artery without angina pectoris: Secondary | ICD-10-CM | POA: Diagnosis not present

## 2021-11-22 LAB — POC OCCULT BLOOD, ED: Fecal Occult Bld: NEGATIVE

## 2021-11-22 LAB — CBC
HCT: 26.9 % — ABNORMAL LOW (ref 39.0–52.0)
Hemoglobin: 8.4 g/dL — ABNORMAL LOW (ref 13.0–17.0)
MCH: 30.3 pg (ref 26.0–34.0)
MCHC: 31.2 g/dL (ref 30.0–36.0)
MCV: 97.1 fL (ref 80.0–100.0)
Platelets: 250 10*3/uL (ref 150–400)
RBC: 2.77 MIL/uL — ABNORMAL LOW (ref 4.22–5.81)
RDW: 16.7 % — ABNORMAL HIGH (ref 11.5–15.5)
WBC: 11.6 10*3/uL — ABNORMAL HIGH (ref 4.0–10.5)
nRBC: 0 % (ref 0.0–0.2)

## 2021-11-22 LAB — COMPREHENSIVE METABOLIC PANEL
ALT: 14 U/L (ref 0–44)
AST: 16 U/L (ref 15–41)
Albumin: 3.8 g/dL (ref 3.5–5.0)
Alkaline Phosphatase: 36 U/L — ABNORMAL LOW (ref 38–126)
Anion gap: 8 (ref 5–15)
BUN: 28 mg/dL — ABNORMAL HIGH (ref 8–23)
CO2: 22 mmol/L (ref 22–32)
Calcium: 9.1 mg/dL (ref 8.9–10.3)
Chloride: 107 mmol/L (ref 98–111)
Creatinine, Ser: 1.47 mg/dL — ABNORMAL HIGH (ref 0.61–1.24)
GFR, Estimated: 47 mL/min — ABNORMAL LOW (ref >60)
Glucose, Bld: 112 mg/dL — ABNORMAL HIGH (ref 70–99)
Potassium: 5 mmol/L (ref 3.5–5.1)
Sodium: 137 mmol/L (ref 135–145)
Total Bilirubin: 0.4 mg/dL (ref 0.3–1.2)
Total Protein: 6.2 g/dL — ABNORMAL LOW (ref 6.5–8.1)

## 2021-11-22 LAB — TYPE AND SCREEN
ABO/RH(D): A NEG
Antibody Screen: NEGATIVE

## 2021-11-22 NOTE — Telephone Encounter (Signed)
There is a Pharmacist, community message about this as well. Wife is here in the office and is discussing her husband with triage nurse. ?

## 2021-11-22 NOTE — Telephone Encounter (Signed)
Noted.  Agreed.  I will defer at this point.  Thanks. ?

## 2021-11-22 NOTE — ED Triage Notes (Signed)
Pt arrived via POV. Pt sent by PCP for hemoglobin 6.8. Pt denies SOB, dizziness or any other sx.  ?

## 2021-11-22 NOTE — ED Provider Triage Note (Signed)
Emergency Medicine Provider Triage Evaluation Note ? ?Ryan Pittman , a 81 y.o. male  was evaluated in triage.  Pt complains of abnormal lab. States that he was called by his PCP and told that his hemoglobin was 8.6 and to come to the ER for same. He states that he was recently placed on Elliquis and subsequently had a GI bleed and required blood transfusion for same. States that soon after that he had his hemoglobin checked and it came up to 11.  States that he continued to have associated blood in his stool until a few days ago he was told by his doctor to stop the Eliquis.  He states that he then stopped having hematochezia about 2 to 3 days ago.  Denies any symptoms including shortness of breath, fatigue, lightheadedness, dizziness, chest pain. ? ?Review of Systems  ?Positive:  ?Negative: See above ? ?Physical Exam  ?BP (!) 146/50   Pulse 76   Temp 98 ?F (36.7 ?C) (Oral)   Resp 16   Ht '5\' 7"'$  (1.702 m)   Wt 71.2 kg   SpO2 100%   BMI 24.58 kg/m?  ?Gen:   Awake, no distress   ?Resp:  Normal effort  ?MSK:   Moves extremities without difficulty  ?Other:   ? ?Medical Decision Making  ?Medically screening exam initiated at 5:39 PM.  Appropriate orders placed.  Ryan Pittman was informed that the remainder of the evaluation will be completed by another provider, this initial triage assessment does not replace that evaluation, and the importance of remaining in the ED until their evaluation is complete. ? ? ?  ?Bud Face, PA-C ?11/22/21 1744 ? ?

## 2021-11-22 NOTE — Telephone Encounter (Signed)
Pt wife stated that pt blood dropped 8.7 and he very weak and have not had blood thinner since last Thursday pt wife stated that she think they should go to the er. But pt wife stated that she want to make sure this is what dr Damita Dunnings would like wife said call her @ [phone] carol ?

## 2021-11-22 NOTE — Telephone Encounter (Signed)
Pts wife came to office without pt (DPR signed) Mrs Pelc said that pt is worse today. Pt is much weaker today. Mrs Mazzocco said pt cannot walk very short distance without having to stop and rest. When pt was tying shoe he had to stop afterwards to rest due to fatigue. Pt is not SOB. No bright red blood seen in stools for last 2 days but stools are very dark. Mrs Bogden wanted to verify that pt needs to go to ED. I read DR Carole Civil comments on 11/21/21 lab resutls and Mrs Magnan voiced understanding and will take pt to ED when she gets home. Mrs Kinne also aware doing hematology referral also. Mrs Busbee wanted me to verify that Dr Damita Dunnings wants pt to go to ED and I spoke with DR Damita Dunnings and he confirmed if pt is feeling worse or weaker yes pt needs to go to ED.Mrs Kenton voiced understanding and declined EMS and she will take pt to ED; thinks they will go to [facility] ED. Mrs Gary also concerned about how long pt has been off blood thinner and she will address with ED provider as well. Sending note to Dr Damita Dunnings and Janett Billow CMA. ?

## 2021-11-22 NOTE — ED Provider Notes (Signed)
?[facility] ?Provider Note ? ? ?CSN: [id number] ?Arrival date & time: 11/22/21  1715 ? ?  ? ?History ? ?Chief Complaint  ?Patient presents with  ? Abnormal Lab  ? ? ?Ryan Pittman is a 81 y.o. male. ? ?Patient with history of paroxysmal atrial fibrillation, coronary artery disease, peripheral vascular disease on Eliquis, chronic kidney disease --presents to the emergency department for evaluation of low hemoglobin.  Patient has been having GI bleeding, suspected, over the past couple of months.  His hemoglobin has been in the sevens.  He has had upper endoscopy, colonoscopy, and capsule endoscopy in February.  This did not show active bleeding but did show gastritis and several jejunal AVMs.  Patient has continued to have dark stool.  His hemoglobin has been monitored by his primary care doctor and it is slowly getting lower again, 8.4 today.  Patient has had shortness of breath exertion, some dizziness with standing.  No chest pain or syncope.  States that stools are currently brown.  He has been off of the Eliquis for the past 5 days.  ? ? ?  ? ?Home Medications ?Prior to Admission medications   ?Medication Sig Start Date End Date Taking? Authorizing Provider  ?amLODipine (NORVASC) 5 MG tablet Take 1 tablet (5 mg total) by mouth daily. 11/07/21   Tonia Ghent, MD  ?apixaban (ELIQUIS) 2.5 MG TABS tablet Take 1 tablet (2.5 mg total) by mouth 2 (two) times daily. 09/16/21   [name], MD  ?Ascorbic Acid (VITAMIN C) 1000 MG tablet Take 1,000 mg by mouth daily.    [provider]  ?aspirin EC 81 MG tablet Held as of 11/11/21 11/11/21   Tonia Ghent, MD  ?cholecalciferol (VITAMIN D3) 25 MCG (1000 UNIT) tablet Take 1,000 Units by mouth daily.    [provider]  ?levothyroxine (SYNTHROID) 75 MCG tablet TAKE 1 TABLET BY MOUTH DAILY EXCEPT ON THURSDAYS TAKE 1.5 TABLETS 09/29/21   Tonia Ghent, MD  ?losartan (COZAAR) 100 MG tablet Take 1 tablet (100  mg total) by mouth daily. 09/09/21 12/08/21  Tonia Ghent, MD  ?metoprolol tartrate (LOPRESSOR) 25 MG tablet Take 0.5 tablets (12.5 mg total) by mouth 2 (two) times daily. 08/26/21   Lendon Colonel, NP  ?omeprazole (PRILOSEC) 20 MG capsule Take 1 capsule (20 mg total) by mouth daily. 08/26/21   Lendon Colonel, NP  ?pramipexole (MIRAPEX) 1 MG tablet Take 1 tablet (1 mg total) by mouth daily. 12/30/20   Tonia Ghent, MD  ?REPATHA SURECLICK 532 MG/ML SOAJ INJECT '140MG'$  EVERY 14 DAYS 07/25/21   [name], MD  ?sulfamethoxazole-trimethoprim (BACTRIM) 400-80 MG tablet Take 1 tablet by mouth 3 (three) times a week. 08/23/21   [provider]  ?tamsulosin (FLOMAX) 0.4 MG CAPS capsule Take 1 capsule (0.4 mg total) by mouth daily. 11/07/21   Stoioff, Ronda Fairly, MD  ?   ? ?Allergies    ?Penicillins, Ace inhibitors, Aspirin, Celebrex [celecoxib], and Lipitor [atorvastatin]   ? ?Review of Systems   ?Review of Systems ? ?Physical Exam ?Updated Vital Signs ?BP (!) 138/51   Pulse 65   Temp 98 ?F (36.7 ?C) (Oral)   Resp 16   Ht '5\' 7"'$  (1.702 m)   Wt 71.2 kg   SpO2 99%   BMI 24.58 kg/m?  ?Physical Exam ?Vitals and nursing note reviewed.  ?Constitutional:   ?   General: He is not in acute distress. ?  Appearance: He is well-developed.  ?HENT:  ?   Head: Normocephalic and atraumatic.  ?   Mouth/Throat:  ?   Mouth: Mucous membranes are moist.  ?Eyes:  ?   General:     ?   Right eye: No discharge.     ?   Left eye: No discharge.  ?   Conjunctiva/sclera: Conjunctivae normal.  ?Cardiovascular:  ?   Rate and Rhythm: Normal rate and regular rhythm.  ?   Heart sounds: Normal heart sounds.  ?Pulmonary:  ?   Effort: Pulmonary effort is normal.  ?   Breath sounds: Normal breath sounds.  ?Abdominal:  ?   Palpations: Abdomen is soft.  ?   Tenderness: There is no abdominal tenderness. There is no guarding or rebound.  ?Genitourinary: ?   Comments: Stool light brown, no active bleeding ?Musculoskeletal:  ?   Cervical  back: Normal range of motion and neck supple.  ?Skin: ?   General: Skin is warm and dry.  ?Neurological:  ?   Mental Status: He is alert.  ? ? ?ED Results / Procedures / Treatments   ?Labs ?(all labs ordered are listed, but only abnormal results are displayed) ?Labs Reviewed  ?COMPREHENSIVE METABOLIC PANEL - Abnormal; Notable for the following components:  ?    Result Value  ? Glucose, Bld 112 (*)   ? BUN 28 (*)   ? Creatinine, Ser 1.47 (*)   ? Total Protein 6.2 (*)   ? Alkaline Phosphatase 36 (*)   ? GFR, Estimated 47 (*)   ? All other components within normal limits  ?CBC - Abnormal; Notable for the following components:  ? WBC 11.6 (*)   ? RBC 2.77 (*)   ? Hemoglobin 8.4 (*)   ? HCT 26.9 (*)   ? RDW 16.7 (*)   ? All other components within normal limits  ?POC OCCULT BLOOD, ED  ?TYPE AND SCREEN  ? ? ?EKG ?None ? ?Radiology ?No results found. ? ?Procedures ?Procedures  ? ? ?Medications Ordered in ED ?Medications - No data to display ? ?ED Course/ Medical Decision Making/ A&P ?  ?Patient seen and examined. History obtained directly from patient, his wife at bedside who provides details regarding recent medical history and work-up, previous clinic and endoscopy notes. Work-up including labs, imaging, EKG ordered in triage, if performed, were reviewed.   ? ?Patient's hemoglobin, over the past 6 days: 9.0 >> 8.6 >> 8.4.  ? ?Labs/EKG: Independently reviewed and interpreted.  This included: CBC with white blood cell count 11.6, hemoglobin 8.4; CMP with creatinine 1.47/BUN 28; type and screen. ? ?Imaging: None ordered ? ?Medications/Fluids: Consider Protonix, will order if Hemoccult positive. ? ?Most recent vital signs reviewed and are as follows: ?BP (!) 138/51   Pulse 65   Temp 98 ?F (36.7 ?C) (Oral)   Resp 16   Ht '5\' 7"'$  (1.702 m)   Wt 71.2 kg   SpO2 99%   BMI 24.58 kg/m?  ? ?Initial impression: Anemia, gradually more symptomatic but stable.  Concern for ongoing GI bleeding in setting of recent  anticoagulation. ? ?Orthostatic VS for the past 24 hrs: ? BP- Lying Pulse- Lying BP- Sitting Pulse- Sitting BP- Standing at 0 minutes Pulse- Standing at 0 minutes  ?11/22/21 2310 141/60 60 143/53 66 140/53 69  ? ? ?Patient discussed with Dr. Dina Rich who has seen patient. ? ?Hemoccult performed with NT chaperone and was negative.  Stool was light brown, not grossly bloody or melanotic. ? ?Reassessment  performed. Patient appears comfortable and stable. ? ?Reviewed pertinent lab work and imaging with patient at bedside. Questions answered.  ? ?Most current vital signs reviewed and are as follows: ?BP (!) 136/59   Pulse (!) 57   Temp 98.1 ?F (36.7 ?C)   Resp 17   Ht '5\' 7"'$  (1.702 m)   Wt 71.2 kg   SpO2 100%   BMI 24.58 kg/m?  ? ?Plan: Discharge to home with close PCP follow-up to monitor hemoglobin.  Patient to continue to hold anticoagulation given concern for GI bleeding and decreasing hemoglobins. ? ?ED return instructions discussed: With worsening bleeding or symptoms such as debilitating shortness of breath, lightheadedness or syncope, chest pain. ? ?Follow-up instructions discussed: Patient encouraged to follow-up with their PCP in 2 days.  ? ?                        ?Medical Decision Making ?Amount and/or Complexity of Data Reviewed ?Labs: ordered. ? ? ?Patient with suspected GI bleeding over the past couple of months with fluctuating hemoglobin, recently held anticoagulation with improvement in appearance of stool, however hemoglobin continuing to trend slowly down.  Hemoglobin 8.4 today.  Patient is mildly symptomatic with shortness of breath with exertion, however he continues to be active.  No lightheadedness or syncope.  No chest pain or shortness of breath at rest.  Vital signs, including orthostatic vital signs, are reassuring.  Hemoccult currently negative.  Patient will need close monitoring of his blood counts and input from his PCP, cardiologist, and gastroenterologist to determine best time to  reinitiate anticoagulation.  Patient and wife seem reliable to return if symptoms do worsen. ? ?The patient's vital signs, pertinent lab work and imaging were reviewed and interpreted as discussed in the ED course. Hospitalization was conside

## 2021-11-23 NOTE — ED Notes (Signed)
DC instructions reviewed with pt. PT verbalized understanding. PT DC °

## 2021-11-23 NOTE — Telephone Encounter (Signed)
The main issue here is rechecking his CBC so I am okay doing this is a lab visit.  I would not restart the blood thinners as long as his hemoglobin is still going down.  It can be an office visit if needed.  Thanks. ?

## 2021-11-23 NOTE — Telephone Encounter (Signed)
Called spoke with pt regarding appt. Attempted to make an appt for just for labs however wasn't sure from the message if patient need come in for a follow up appt to seen / labs or just labs CBC. Pt also mention that he was told by the ED to hold  his blood thinners medication  until was seen by you in the office for an appt. Spoke w/ Leafy Ro to find a time slot to bring the patient in for an appt.  Was told to bring him in on this Friday 24th '@1pm'$ . Please let me know if this is okay ? Advised pt if something changes he will be contacted by our office. Please advise  ?

## 2021-11-23 NOTE — Telephone Encounter (Signed)
I saw the ER note.  I still think outpatient hematology is reasonable given his potential need for transfusion.  Please schedule follow-up CBC on Friday or Monday at a lab visit.  I put in the order.  Thanks. ?

## 2021-11-23 NOTE — Addendum Note (Signed)
Addended by: Ellamae Sia on: 11/23/2021 11:00 AM ? ? Modules accepted: Orders ? ?

## 2021-11-23 NOTE — Addendum Note (Signed)
Addended by: Tonia Ghent on: 11/23/2021 09:56 AM ? ? Modules accepted: Orders ? ?

## 2021-11-23 NOTE — Discharge Instructions (Signed)
Please read and follow all provided instructions. ? ?Your diagnoses today include:  ?1. Anemia, unspecified type   ? ?Tests performed today include: ?Blood cell counts and electrolytes: Your red blood cell count continues to go down very slowly, but has not reached a point where you require a transfusion today ?Stool test for blood: Was negative ?Vital signs. See below for your results today.  ? ?Medications prescribed:  ?None ? ?Take any prescribed medications only as directed. ? ?Home care instructions:  ?Follow any educational materials contained in this packet. ? ?Do not yet restart your anticoagulation.  This is a decision that would need to be made by your primary care team, gastroenterologist, and cardiologist. ? ? ?Follow-up instructions: ?Please follow-up with your primary care provider in the next 2 days for further evaluation of your symptoms.  ? ?Return instructions:  ?Please return to the Emergency Department if you experience worsening symptoms.  ?Return to the emergency department if you develop visible blood in the stool, have severe lightheadedness or you pass out, if you develop chest pain or shortness of breath that is worsening. ?Please return if you have any other emergent concerns. ? ?Additional Information: ? ?Your vital signs today were: ?BP (!) 144/58   Pulse 61   Temp 98 ?F (36.7 ?C) (Oral)   Resp 15   Ht '5\' 7"'$  (1.702 m)   Wt 71.2 kg   SpO2 100%   BMI 24.58 kg/m?  ?If your blood pressure (BP) was elevated above 135/85 this visit, please have this repeated by your doctor within one month. ?-------------- ? ?

## 2021-11-25 ENCOUNTER — Other Ambulatory Visit: Payer: Self-pay

## 2021-11-25 ENCOUNTER — Ambulatory Visit (INDEPENDENT_AMBULATORY_CARE_PROVIDER_SITE_OTHER): Payer: Medicare Other | Admitting: Family Medicine

## 2021-11-25 ENCOUNTER — Telehealth: Payer: Self-pay | Admitting: Family Medicine

## 2021-11-25 ENCOUNTER — Encounter: Payer: Self-pay | Admitting: Family Medicine

## 2021-11-25 VITALS — BP 110/40 | HR 83 | Temp 98.1°F | Ht 67.0 in | Wt 157.0 lb

## 2021-11-25 DIAGNOSIS — I701 Atherosclerosis of renal artery: Secondary | ICD-10-CM

## 2021-11-25 DIAGNOSIS — D649 Anemia, unspecified: Secondary | ICD-10-CM | POA: Diagnosis not present

## 2021-11-25 DIAGNOSIS — D62 Acute posthemorrhagic anemia: Secondary | ICD-10-CM

## 2021-11-25 LAB — CBC WITH DIFFERENTIAL/PLATELET
Basophils Absolute: 0.1 10*3/uL (ref 0.0–0.1)
Basophils Relative: 0.9 % (ref 0.0–3.0)
Eosinophils Absolute: 0.3 10*3/uL (ref 0.0–0.7)
Eosinophils Relative: 2.7 % (ref 0.0–5.0)
HCT: 24.8 % — ABNORMAL LOW (ref 39.0–52.0)
Hemoglobin: 8.2 g/dL — ABNORMAL LOW (ref 13.0–17.0)
Lymphocytes Relative: 6.6 % — ABNORMAL LOW (ref 12.0–46.0)
Lymphs Abs: 0.7 10*3/uL (ref 0.7–4.0)
MCHC: 32.9 g/dL (ref 30.0–36.0)
MCV: 90.7 fl (ref 78.0–100.0)
Monocytes Absolute: 0.8 10*3/uL (ref 0.1–1.0)
Monocytes Relative: 7.2 % (ref 3.0–12.0)
Neutro Abs: 9.3 10*3/uL — ABNORMAL HIGH (ref 1.4–7.7)
Neutrophils Relative %: 82.6 % — ABNORMAL HIGH (ref 43.0–77.0)
Platelets: 205 10*3/uL (ref 150.0–400.0)
RBC: 2.74 Mil/uL — ABNORMAL LOW (ref 4.22–5.81)
RDW: 18.2 % — ABNORMAL HIGH (ref 11.5–15.5)
WBC: 11.3 10*3/uL — ABNORMAL HIGH (ref 4.0–10.5)

## 2021-11-25 LAB — BASIC METABOLIC PANEL
BUN: 29 mg/dL — ABNORMAL HIGH (ref 6–23)
CO2: 24 mEq/L (ref 19–32)
Calcium: 8.8 mg/dL (ref 8.4–10.5)
Chloride: 102 mEq/L (ref 96–112)
Creatinine, Ser: 1.84 mg/dL — ABNORMAL HIGH (ref 0.40–1.50)
GFR: 33.67 mL/min — ABNORMAL LOW (ref >60.00)
Glucose, Bld: 125 mg/dL — ABNORMAL HIGH (ref 70–99)
Potassium: 4.8 mEq/L (ref 3.5–5.1)
Sodium: 133 mEq/L — ABNORMAL LOW (ref 135–145)

## 2021-11-25 NOTE — Telephone Encounter (Signed)
Please call pt.  Upon extra consideration, would hold amlodipine in addition to losartan for now.   Would continue metoprolol. Thanks.  ?

## 2021-11-25 NOTE — Telephone Encounter (Signed)
Called and spoke with patient and advised to stop amlodipine along with the losartan for now. Advised to continue the metoprolol and can restart the amlodipine if his BP becomes elevated. Patient verbalized understanding.  ?

## 2021-11-25 NOTE — Patient Instructions (Signed)
Stop losartan in the meantime.   ?Take care.  Glad to see you. ?Go to the lab on the way out.   If you have mychart we'll likely use that to update you.    ?If you feel worse then go to the ER.  ?

## 2021-11-25 NOTE — Progress Notes (Signed)
This visit occurred during the SARS-CoV-2 public health emergency.  Safety protocols were in place, including screening questions prior to the visit, additional usage of staff PPE, and extensive cleaning of exam room while observing appropriate contact time as indicated for disinfecting solutions. ? ?Progressive anemia and lower BP noted.  D/w pt about options re: antiplatelet and anticoagulation.  Off both now.  Repeat labs pending.  D/wt pt about recent labs and options.  There is a risk being on or off of anticoagulation/antiplatelet medication.  It is difficult for me to recommend him to continue antiplatelet or anticoagulation medication in the setting of anemia with decreasing hemoglobin.  It appears that it would be riskier to resume both medications then to go without.  Rationale discussed with patient.  He agreed. ? ?We talked about setting his hematology appointment up with [facility] (he has been seen there prior) instead of locally.  It may make sense to keep his local appointment in the meantime.  He can update me about his preferences.   ? ?No CP but can occ get a little lightheaded.  Prev IFOB positive.  H/o small bowel AVMs.  It is assumed that small bowel AVMs are the source of his GI loss. ? ?Some cough recently noted.  No fevers.  No sputum.  No ST.  Started mucinex in the meantime.  Since his lungs are clear, see below, I asked him to monitor this in the meantime. ? ?Meds, vitals, and allergies reviewed.  ? ?ROS: Per HPI unless specifically indicated in ROS section  ? ?Nad ?Ncat ?Nec supple, no LA ?Rrr ?Ctab ?Not pale appearing.   ?Abdomen soft.  Nontender. ?Extremities well perfused ? ?He has lower blood pressures noted at home, usually 120-130s/50s-60s.   ? ?Current Outpatient Medications on File Prior to Visit  ?Medication Sig Dispense Refill  ? acetaminophen (TYLENOL) 500 MG tablet Take 1,000 mg by mouth every 6 (six) hours as needed for moderate pain or headache.    ? Ascorbic Acid (VITAMIN C)  1000 MG tablet Take 1,000 mg by mouth daily.    ? cholecalciferol (VITAMIN D3) 25 MCG (1000 UNIT) tablet Take 1,000 Units by mouth daily.    ? hydrOXYzine (ATARAX) 10 MG tablet Take 5-10 mg by mouth 3 (three) times daily as needed for anxiety (sleep).    ? levothyroxine (SYNTHROID) 75 MCG tablet TAKE 1 TABLET BY MOUTH DAILY EXCEPT ON THURSDAYS TAKE 1.5 TABLETS 100 tablet 3  ? metoprolol tartrate (LOPRESSOR) 25 MG tablet Take 0.5 tablets (12.5 mg total) by mouth 2 (two) times daily. 60 tablet 3  ? Multiple Vitamins-Minerals (MULTIVITAMIN MEN 50+) TABS Take 1 tablet by mouth daily.    ? omeprazole (PRILOSEC) 20 MG capsule Take 1 capsule (20 mg total) by mouth daily. 30 capsule 6  ? pramipexole (MIRAPEX) 1 MG tablet Take 1 tablet (1 mg total) by mouth daily.    ? REPATHA SURECLICK 211 MG/ML SOAJ INJECT '140MG'$  EVERY 14 DAYS 2 mL 11  ? sulfamethoxazole-trimethoprim (BACTRIM) 400-80 MG tablet Take 1 tablet by mouth every Monday, Wednesday, and Friday.    ? tamsulosin (FLOMAX) 0.4 MG CAPS capsule Take 1 capsule (0.4 mg total) by mouth daily. 90 capsule 3  ? ?No current facility-administered medications on file prior to visit.  ? ? ?

## 2021-11-27 NOTE — Assessment & Plan Note (Signed)
Lower blood pressure noted, likely from a combination of anemia and his blood pressure medication. ?The initial plan was to hold losartan.  I thought about this more after the patient left the office and I asked staff to call him back and tell him to hold amlodipine concurrently.  Would still continue metoprolol for now.  Rechecking hemoglobin today. ? ?If he has any chest pain or shortness of breath or progressive lightheadedness then I want him to go to the emergency room.  I suspect that holding his blood pressure medications as described above will help with his blood pressure and how he feels overall but it will not fix his anemia.  We can make decisions about transfusion based on his follow-up hemoglobin level. ? ?At this point still okay for outpatient follow-up.  ? ?35 minutes were devoted to patient care in this encounter (this includes time spent reviewing the patient's file/history, interviewing and examining the patient, counseling/reviewing plan with patient).  ? ?

## 2021-11-28 ENCOUNTER — Encounter: Payer: Self-pay | Admitting: Surgery

## 2021-11-28 ENCOUNTER — Ambulatory Visit (INDEPENDENT_AMBULATORY_CARE_PROVIDER_SITE_OTHER)
Admit: 2021-11-28 | Discharge: 2021-11-28 | Disposition: A | Discharge status: Home / Self Care | Payer: Medicare Other | Attending: Surgery | Admitting: Surgery

## 2021-11-28 ENCOUNTER — Ambulatory Visit (INDEPENDENT_AMBULATORY_CARE_PROVIDER_SITE_OTHER): Payer: Medicare Other | Admitting: Surgery

## 2021-11-28 ENCOUNTER — Other Ambulatory Visit: Payer: Self-pay

## 2021-11-28 ENCOUNTER — Ambulatory Visit (HOSPITAL_COMMUNITY)
Admission: RE | Admit: 2021-11-28 | Discharge: 2021-11-28 | Disposition: A | Discharge status: Home / Self Care | Payer: Medicare Other | Source: Ambulatory Visit | Attending: Surgery | Admitting: Surgery

## 2021-11-28 VITALS — BP 169/75 | HR 67 | Temp 97.7°F | Resp 20 | Ht 67.0 in | Wt 157.0 lb

## 2021-11-28 DIAGNOSIS — I70213 Atherosclerosis of native arteries of extremities with intermittent claudication, bilateral legs: Secondary | ICD-10-CM

## 2021-11-28 DIAGNOSIS — I739 Peripheral vascular disease, unspecified: Secondary | ICD-10-CM | POA: Insufficient documentation

## 2021-11-28 DIAGNOSIS — N182 Chronic kidney disease, stage 2 (mild): Secondary | ICD-10-CM | POA: Diagnosis not present

## 2021-11-28 DIAGNOSIS — I701 Atherosclerosis of renal artery: Secondary | ICD-10-CM

## 2021-11-28 DIAGNOSIS — N179 Acute kidney failure, unspecified: Secondary | ICD-10-CM | POA: Diagnosis not present

## 2021-11-28 DIAGNOSIS — I10 Essential (primary) hypertension: Secondary | ICD-10-CM | POA: Diagnosis not present

## 2021-11-28 DIAGNOSIS — R809 Proteinuria, unspecified: Secondary | ICD-10-CM | POA: Diagnosis not present

## 2021-11-28 DIAGNOSIS — D631 Anemia in chronic kidney disease: Secondary | ICD-10-CM | POA: Diagnosis not present

## 2021-11-28 NOTE — Progress Notes (Signed)
? ?[facility] [place] ? ?Patient name: Ryan Pittman MRN: [id number] DOB: Sep 09, 1940 Sex: male ? ? ?REASON FOR VISIT:  ? ? ?Follow up ? ?HISOTRY OF PRESENT ILLNESS:  ? ? ?Ryan Pittman is a 81 y.o. male with a history of a left femoral to below-knee popliteal bypass graft by Dr. Lucky Cowboy.  In 2020, he underwent a right iliofemoral endarterectomy with bovine patch angioplasty utilizing a long 8 cm patch.  In October 2021 he had drug-coated balloon angioplasty of the right external iliac and common femoral arteries as well as atherectomy of the right superficial femoral artery.  More recently, he was found to have a high-grade left renal artery stenosis with difficult to control blood pressure.  He was also having right leg claudication.  Therefore on 10/25/2021 he underwent selective renal angiography which did not identify any significant stenosis.  He did however have multiple lesions within the right superficial femoral artery that were treated with overlapping 6 mm Elluvia stents.  He has since had issues with dropping his hemoglobin and has undergone a full work-up without discovering the etiology.  He is now off all anticoagulation and antiplatelet medication. ? ?Patient has a history of hypertension which is medically managed.  He is on a statin for hypercholesterolemia.  He is status post CABG for coronary artery disease in 2019.  He was recently started on Eliquis for atrial fibrillation.  He does have a history of GI bleed ?PAST MEDICAL HISTORY:  ? ?Past Medical History:  ?Diagnosis Date  ? Anemia   ? due to GIB, s/p transfusion  ? Anginal pain ([place]) 07/10/2018  ? Arthritis   ? back pain, much worse after consecutive golf rounds  ? Cancer [facility])   ? thyroid  ? Chronic kidney disease   ? Colon polyps   ? Coronary artery disease   ? COVID-19   ? Dyspnea   ? walking up a hill  ? Dysrhythmia 2020  ? GERD (gastroesophageal reflux disease)   ? Hypercalcemia    ? h/o, resolved as of 2012, prev due to high amount of calcium intake  ? Hyperlipidemia   ? Hypertension   ? Hypothyroidism   ? IgG gammopathy   ? stable as of 2012 per [facility]  ? Pneumonia   ? as a child  ? PVD (peripheral vascular disease) ([place])   ? L leg bypass, R leg stented  ? ? ? ?FAMILY HISTORY:  ? ?Family History  ?Problem Relation Age of Onset  ? Diabetes Mother   ? Stroke Father   ? Colon cancer Neg Hx   ? Prostate cancer Neg Hx   ? ? ?SOCIAL HISTORY:  ? ?Social History  ? ?Tobacco Use  ? Smoking status: Former  ?  Types: Cigarettes  ?  Quit date: 09/04/2008  ?  Years since quitting: 13.2  ? Smokeless tobacco: Former  ?  Types: Chew  ? Tobacco comments:  ?  HAs quit tobacco products 2009  ?Substance Use Topics  ? Alcohol use: Yes  ?  Comment: occ  ? ? ? ?ALLERGIES:  ? ?Allergies  ?Allergen Reactions  ? Penicillins Itching and Other (See Comments)  ?  PATIENT HAS HAD A PCN REACTION WITH IMMEDIATE RASH, FACIAL/TONGUE/THROAT SWELLING, SOB, OR LIGHTHEADEDNESS WITH HYPOTENSION:  #  #  YES  #  #  ?Has patient had a PCN reaction causing severe rash involving mucus membranes or skin necrosis: No ?Has patient had a PCN reaction that required  hospitalization: No ?Has patient had a PCN reaction occurring within the last 10 years: No ?If all of the above answers are "NO", then may proceed with Cephalosporin use. ?  ? Ace Inhibitors Cough  ? Aspirin Other (See Comments)  ?  H/o GI bleed  ? Celebrex [Celecoxib] Other (See Comments)  ?  GI bleed  ? Lipitor [Atorvastatin] Other (See Comments)  ?  myalgias  ? ? ? ?CURRENT MEDICATIONS:  ? ?Current Outpatient Medications  ?Medication Sig Dispense Refill  ? acetaminophen (TYLENOL) 500 MG tablet Take 1,000 mg by mouth every 6 (six) hours as needed for moderate pain or headache.    ? Ascorbic Acid (VITAMIN C) 1000 MG tablet Take 1,000 mg by mouth daily.    ? cholecalciferol (VITAMIN D3) 25 MCG (1000 UNIT) tablet Take 1,000 Units by mouth daily.    ? hydrOXYzine (ATARAX) 10 MG  tablet Take 5-10 mg by mouth 3 (three) times daily as needed for anxiety (sleep).    ? levothyroxine (SYNTHROID) 75 MCG tablet TAKE 1 TABLET BY MOUTH DAILY EXCEPT ON THURSDAYS TAKE 1.5 TABLETS 100 tablet 3  ? metoprolol tartrate (LOPRESSOR) 25 MG tablet Take 0.5 tablets (12.5 mg total) by mouth 2 (two) times daily. 60 tablet 3  ? Multiple Vitamins-Minerals (MULTIVITAMIN MEN 50+) TABS Take 1 tablet by mouth daily.    ? omeprazole (PRILOSEC) 20 MG capsule Take 1 capsule (20 mg total) by mouth daily. 30 capsule 6  ? pramipexole (MIRAPEX) 1 MG tablet Take 1 tablet (1 mg total) by mouth daily.    ? REPATHA SURECLICK 902 MG/ML SOAJ INJECT '140MG'$  EVERY 14 DAYS 2 mL 11  ? sulfamethoxazole-trimethoprim (BACTRIM) 400-80 MG tablet Take 1 tablet by mouth every Monday, Wednesday, and Friday.    ? tamsulosin (FLOMAX) 0.4 MG CAPS capsule Take 1 capsule (0.4 mg total) by mouth daily. 90 capsule 3  ? ?No current facility-administered medications for this visit.  ? ? ?REVIEW OF SYSTEMS:  ? ?'[X]'$  denotes positive finding, '[ ]'$  denotes negative finding ?Cardiac  Comments:  ?Chest pain or chest pressure:    ?Shortness of breath upon exertion:    ?Short of breath when lying flat:    ?Irregular heart rhythm:    ?    ?Vascular    ?Pain in calf, thigh, or hip brought on by ambulation:    ?Pain in feet at night that wakes you up from your sleep:     ?Blood clot in your veins:    ?Leg swelling:     ?    ?Pulmonary    ?Oxygen at home:    ?Productive cough:     ?Wheezing:     ?    ?Neurologic    ?Sudden weakness in arms or legs:     ?Sudden numbness in arms or legs:     ?Sudden onset of difficulty speaking or slurred speech:    ?Temporary loss of vision in one eye:     ?Problems with dizziness:     ?    ?Gastrointestinal    ?Blood in stool:     ?Vomited blood:     ?    ?Genitourinary    ?Burning when urinating:     ?Blood in urine:    ?    ?Psychiatric    ?Major depression:     ?    ?Hematologic    ?Bleeding problems:    ?Problems with blood  clotting too easily:    ?    ?Skin    ?  Rashes or ulcers:    ?    ?Constitutional    ?Fever or chills:    ? ? ?PHYSICAL EXAM:  ? ?Vitals:  ? 11/28/21 1450  ?BP: (!) 169/75  ?Pulse: 67  ?Resp: 20  ?Temp: 97.7 ?F (36.5 ?C)  ?SpO2: 97%  ?Weight: 157 lb (71.2 kg)  ?Height: '5\' 7"'$  (1.702 m)  ? ? ?GENERAL: The patient is a well-nourished male, in no acute distress. The vital signs are documented above. ?CARDIAC: There is a regular rate and rhythm.  ?VASCULAR: Nonpalpable pedal pulses ?PULMONARY: Non-labored respirations ?ABDOMEN: Soft and non-tender  ?MUSCULOSKELETAL: There are no major deformities or cyanosis. ?NEUROLOGIC: No focal weakness or paresthesias are detected. ?SKIN: There are no ulcers or rashes noted. ?PSYCHIATRIC: The patient has a normal affect. ? ?STUDIES:  ? ?I have reviewed the following studies: ?+-------+-----------+-----------+------------+------------+  ?ABI/TBIToday's ABIToday's TBIPrevious ABIPrevious TBI  ?+-------+-----------+-----------+------------+------------+  ?Right  1.17       0.36       1.10        0.27          ?+-------+-----------+-----------+------------+------------+  ?Left   1.19       0.46       0.90        0.36          ?+-------+-----------+-----------+------------+------------+ ?Right toe pressure equals 55 ?Left toe pressure equals 71 ? ?Right: Patent stent with no evidence of stenosis in the Proximal SFA  ?artery.  ? ?Left: Patent left fem-pop bypass graft with no evidence of stenosis.  ?MEDICAL ISSUES:  ? ?Hypercholesterolemia: The patient is being evaluated for Repatha given his statin allergy ? ?PAD: By ultrasound, his bypass and stents are widely patent.  He required going off of his anticoagulation because of a drop in his hemoglobin.  He has been off of everything for approximately 13 days.  I have reached out to Dr. Damita Dunnings to discuss the possibility of reinitiating some of his medication.  Obviously this is a very difficult position to be in.   Fortunately, the patient appears to no longer have issues with bleeding.  I do have him scheduled to follow-up in 3 months with repeat imaging ? ? ?[name], IV, MD, FACS ?[facility] [place] ?Tel [phone]

## 2021-11-29 ENCOUNTER — Other Ambulatory Visit: Payer: Self-pay | Admitting: Family Medicine

## 2021-11-29 ENCOUNTER — Other Ambulatory Visit: Payer: Self-pay | Admitting: *Deleted

## 2021-11-29 DIAGNOSIS — M48062 Spinal stenosis, lumbar region with neurogenic claudication: Secondary | ICD-10-CM | POA: Diagnosis not present

## 2021-11-29 DIAGNOSIS — I70213 Atherosclerosis of native arteries of extremities with intermittent claudication, bilateral legs: Secondary | ICD-10-CM

## 2021-11-29 DIAGNOSIS — K5520 Angiodysplasia of colon without hemorrhage: Secondary | ICD-10-CM | POA: Diagnosis not present

## 2021-11-29 DIAGNOSIS — D638 Anemia in other chronic diseases classified elsewhere: Secondary | ICD-10-CM | POA: Diagnosis not present

## 2021-11-29 DIAGNOSIS — I739 Peripheral vascular disease, unspecified: Secondary | ICD-10-CM

## 2021-11-29 DIAGNOSIS — M5442 Lumbago with sciatica, left side: Secondary | ICD-10-CM | POA: Diagnosis not present

## 2021-11-29 DIAGNOSIS — D649 Anemia, unspecified: Secondary | ICD-10-CM

## 2021-11-29 DIAGNOSIS — G8929 Other chronic pain: Secondary | ICD-10-CM | POA: Diagnosis not present

## 2021-12-01 ENCOUNTER — Other Ambulatory Visit: Payer: Self-pay

## 2021-12-01 ENCOUNTER — Telehealth: Payer: Self-pay

## 2021-12-01 DIAGNOSIS — D62 Acute posthemorrhagic anemia: Secondary | ICD-10-CM

## 2021-12-01 NOTE — Telephone Encounter (Signed)
Referral received from PCP for Anemia and possible blood transfusion. PCP has communicated with patient's wife regarding referral.  ? ?New patient scheduled on 4/5. Please see if pt can come for labs the day prior and add blood to 4/5. Ok to move appt to earlier spot to fit blood transfusion. Please inform pt of appt details.  ?

## 2021-12-02 ENCOUNTER — Other Ambulatory Visit (INDEPENDENT_AMBULATORY_CARE_PROVIDER_SITE_OTHER): Payer: Medicare Other

## 2021-12-02 DIAGNOSIS — D649 Anemia, unspecified: Secondary | ICD-10-CM

## 2021-12-02 LAB — CBC WITH DIFFERENTIAL/PLATELET
Basophils Absolute: 0.1 10*3/uL (ref 0.0–0.1)
Basophils Relative: 0.8 % (ref 0.0–3.0)
Eosinophils Absolute: 0.2 10*3/uL (ref 0.0–0.7)
Eosinophils Relative: 1.7 % (ref 0.0–5.0)
HCT: 26.3 % — ABNORMAL LOW (ref 39.0–52.0)
Hemoglobin: 8.9 g/dL — ABNORMAL LOW (ref 13.0–17.0)
Lymphocytes Relative: 12.8 % (ref 12.0–46.0)
Lymphs Abs: 1.3 10*3/uL (ref 0.7–4.0)
MCHC: 34 g/dL (ref 30.0–36.0)
MCV: 88.3 fl (ref 78.0–100.0)
Monocytes Absolute: 0.8 10*3/uL (ref 0.1–1.0)
Monocytes Relative: 7.7 % (ref 3.0–12.0)
Neutro Abs: 8.1 10*3/uL — ABNORMAL HIGH (ref 1.4–7.7)
Neutrophils Relative %: 77 % (ref 43.0–77.0)
Platelets: 371 10*3/uL (ref 150.0–400.0)
RBC: 2.97 Mil/uL — ABNORMAL LOW (ref 4.22–5.81)
RDW: 17.3 % — ABNORMAL HIGH (ref 11.5–15.5)
WBC: 10.5 10*3/uL (ref 4.0–10.5)

## 2021-12-02 LAB — BASIC METABOLIC PANEL
BUN: 20 mg/dL (ref 6–23)
CO2: 23 mEq/L (ref 19–32)
Calcium: 9.3 mg/dL (ref 8.4–10.5)
Chloride: 103 mEq/L (ref 96–112)
Creatinine, Ser: 1.32 mg/dL (ref 0.40–1.50)
GFR: 50.16 mL/min — ABNORMAL LOW (ref >60.00)
Glucose, Bld: 95 mg/dL (ref 70–99)
Potassium: 4.6 mEq/L (ref 3.5–5.1)
Sodium: 135 mEq/L (ref 135–145)

## 2021-12-02 NOTE — Telephone Encounter (Signed)
That should be fine. He will still need labs a day prior to apt please.  ? ?

## 2021-12-06 ENCOUNTER — Other Ambulatory Visit: Payer: Medicare Other

## 2021-12-07 ENCOUNTER — Other Ambulatory Visit: Payer: Self-pay

## 2021-12-07 ENCOUNTER — Inpatient Hospital Stay: Payer: Medicare Other

## 2021-12-07 ENCOUNTER — Encounter: Payer: Self-pay | Admitting: Oncology

## 2021-12-07 ENCOUNTER — Inpatient Hospital Stay: Payer: Medicare Other | Attending: Oncology | Admitting: Oncology

## 2021-12-07 VITALS — BP 146/71 | HR 60 | Temp 96.0°F | Resp 17 | Ht 67.0 in | Wt 155.0 lb

## 2021-12-07 DIAGNOSIS — D500 Iron deficiency anemia secondary to blood loss (chronic): Secondary | ICD-10-CM

## 2021-12-07 DIAGNOSIS — E611 Iron deficiency: Secondary | ICD-10-CM | POA: Diagnosis not present

## 2021-12-07 DIAGNOSIS — D649 Anemia, unspecified: Secondary | ICD-10-CM | POA: Insufficient documentation

## 2021-12-07 DIAGNOSIS — D472 Monoclonal gammopathy: Secondary | ICD-10-CM | POA: Diagnosis not present

## 2021-12-07 DIAGNOSIS — I739 Peripheral vascular disease, unspecified: Secondary | ICD-10-CM | POA: Diagnosis not present

## 2021-12-07 DIAGNOSIS — E039 Hypothyroidism, unspecified: Secondary | ICD-10-CM | POA: Diagnosis not present

## 2021-12-07 DIAGNOSIS — D509 Iron deficiency anemia, unspecified: Secondary | ICD-10-CM | POA: Diagnosis not present

## 2021-12-07 DIAGNOSIS — R5383 Other fatigue: Secondary | ICD-10-CM

## 2021-12-07 DIAGNOSIS — D72828 Other elevated white blood cell count: Secondary | ICD-10-CM

## 2021-12-07 DIAGNOSIS — D72829 Elevated white blood cell count, unspecified: Secondary | ICD-10-CM | POA: Diagnosis not present

## 2021-12-07 DIAGNOSIS — D62 Acute posthemorrhagic anemia: Secondary | ICD-10-CM

## 2021-12-07 DIAGNOSIS — I4891 Unspecified atrial fibrillation: Secondary | ICD-10-CM | POA: Diagnosis not present

## 2021-12-07 DIAGNOSIS — I129 Hypertensive chronic kidney disease with stage 1 through stage 4 chronic kidney disease, or unspecified chronic kidney disease: Secondary | ICD-10-CM | POA: Insufficient documentation

## 2021-12-07 DIAGNOSIS — R053 Chronic cough: Secondary | ICD-10-CM | POA: Diagnosis not present

## 2021-12-07 DIAGNOSIS — N189 Chronic kidney disease, unspecified: Secondary | ICD-10-CM | POA: Diagnosis not present

## 2021-12-07 LAB — CBC WITH DIFFERENTIAL/PLATELET
Abs Immature Granulocytes: 0.13 10*3/uL — ABNORMAL HIGH (ref 0.00–0.07)
Basophils Absolute: 0.1 10*3/uL (ref 0.0–0.1)
Basophils Relative: 1 %
Eosinophils Absolute: 0.8 10*3/uL — ABNORMAL HIGH (ref 0.0–0.5)
Eosinophils Relative: 6 %
HCT: 28 % — ABNORMAL LOW (ref 39.0–52.0)
Hemoglobin: 9.2 g/dL — ABNORMAL LOW (ref 13.0–17.0)
Immature Granulocytes: 1 %
Lymphocytes Relative: 14 %
Lymphs Abs: 1.8 10*3/uL (ref 0.7–4.0)
MCH: 29.5 pg (ref 26.0–34.0)
MCHC: 32.9 g/dL (ref 30.0–36.0)
MCV: 89.7 fL (ref 80.0–100.0)
Monocytes Absolute: 0.8 10*3/uL (ref 0.1–1.0)
Monocytes Relative: 6 %
Neutro Abs: 8.9 10*3/uL — ABNORMAL HIGH (ref 1.7–7.7)
Neutrophils Relative %: 72 %
Platelets: 392 10*3/uL (ref 150–400)
RBC: 3.12 MIL/uL — ABNORMAL LOW (ref 4.22–5.81)
RDW: 16.1 % — ABNORMAL HIGH (ref 11.5–15.5)
WBC: 12.5 10*3/uL — ABNORMAL HIGH (ref 4.0–10.5)
nRBC: 0 % (ref 0.0–0.2)

## 2021-12-07 LAB — RETIC PANEL
Immature Retic Fract: 13.2 % (ref 2.3–15.9)
RBC.: 3.14 MIL/uL — ABNORMAL LOW (ref 4.22–5.81)
Retic Count, Absolute: 43 10*3/uL (ref 19.0–186.0)
Retic Ct Pct: 1.4 % (ref 0.4–3.1)
Reticulocyte Hemoglobin: 24.9 pg — ABNORMAL LOW (ref >27.9)

## 2021-12-07 LAB — COMPREHENSIVE METABOLIC PANEL
ALT: 14 U/L (ref 0–44)
AST: 18 U/L (ref 15–41)
Albumin: 4.2 g/dL (ref 3.5–5.0)
Alkaline Phosphatase: 41 U/L (ref 38–126)
Anion gap: 6 (ref 5–15)
BUN: 25 mg/dL — ABNORMAL HIGH (ref 8–23)
CO2: 24 mmol/L (ref 22–32)
Calcium: 9 mg/dL (ref 8.9–10.3)
Chloride: 104 mmol/L (ref 98–111)
Creatinine, Ser: 1.29 mg/dL — ABNORMAL HIGH (ref 0.61–1.24)
GFR, Estimated: 55 mL/min — ABNORMAL LOW (ref >60)
Glucose, Bld: 93 mg/dL (ref 70–99)
Potassium: 4.5 mmol/L (ref 3.5–5.1)
Sodium: 134 mmol/L — ABNORMAL LOW (ref 135–145)
Total Bilirubin: 0.1 mg/dL — ABNORMAL LOW (ref 0.3–1.2)
Total Protein: 6.7 g/dL (ref 6.5–8.1)

## 2021-12-07 LAB — TECHNOLOGIST SMEAR REVIEW
Plt Morphology: NORMAL
RBC MORPHOLOGY: NORMAL
WBC MORPHOLOGY: NORMAL

## 2021-12-07 LAB — IRON AND TIBC
Iron: 17 ug/dL — ABNORMAL LOW (ref 45–182)
Saturation Ratios: 4 % — ABNORMAL LOW (ref 17.9–39.5)
TIBC: 484 ug/dL — ABNORMAL HIGH (ref 250–450)
UIBC: 467 ug/dL

## 2021-12-07 LAB — HEPATITIS PANEL, ACUTE
HCV Ab: NONREACTIVE
Hep A IgM: NONREACTIVE
Hep B C IgM: NONREACTIVE
Hepatitis B Surface Ag: NONREACTIVE

## 2021-12-07 LAB — LACTATE DEHYDROGENASE: LDH: 124 U/L (ref 98–192)

## 2021-12-07 LAB — FERRITIN: Ferritin: 15 ng/mL — ABNORMAL LOW (ref 24–336)

## 2021-12-07 LAB — SAMPLE TO BLOOD BANK

## 2021-12-07 LAB — VITAMIN B12: Vitamin B-12: 595 pg/mL (ref 180–914)

## 2021-12-07 LAB — FOLATE: Folate: 39 ng/mL (ref >5.9)

## 2021-12-07 NOTE — Progress Notes (Signed)
Patient here for initial oncology appointment, concerns of extreme fatigue  ?

## 2021-12-07 NOTE — Addendum Note (Signed)
Addended by: Earlie Server on: 12/07/2021 11:15 PM ? ? Modules accepted: Orders ? ?

## 2021-12-07 NOTE — Progress Notes (Addendum)
?Hematology/Oncology Consult note ?[phone] [phone] ?  ? ?   ? ? ?Patient Care Team: ?Tonia Ghent, MD as PCP - General (Family Medicine) ?[name], MD as PCP - Cardiology (Cardiology) ?Lorelee Cover., MD as Consulting Physician (Ophthalmology) ?Scott C. Stoiff, MD as Consulting Physician (Urology) ?Macario Carls, MS, CCC-A as Set designer (Audiology) ?Deberah Pelton, NP as Nurse Practitioner (Cardiology) ?Foltanski, Cleaster Corin, [facility] as Pharmacist (Pharmacist) ?Alice Reichert, Benay Pike, MD as Consulting Physician (Gastroenterology) ?Earlie Server, MD as Consulting Physician (Oncology) ?Ottie Glazier, MD as Consulting Physician (Pulmonary Disease) ? ?REFERRING PROVIDER: ?Tonia Ghent, MD  ?CHIEF COMPLAINTS/REASON FOR VISIT:  ?Evaluation of anemia ? ?HISTORY OF PRESENTING ILLNESS:  ? ?Ryan Pittman is a  81 y.o.  male with PMH listed below was seen in consultation at the request of  Tonia Ghent, MD  for evaluation of anemia ? ?Patient reports feeling fatigued and tired. ?11/22/2021, ED evaluation for low hemoglobin. + History of melena.  Patient has had extensive GI work-up including upper endoscopy, colonoscopy and capsule study in February.  No active bleeding source was discovered.  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  Patient was advised to take PPI.  Patient was on Eliquis for peripheral vascular disease, atrial fibrillation.  Eliquis has been stopped since mid March 2023. ?Patient reports that currently his stool color is brown.  Fatigue level is slightly better. ?Denies unintentional weight loss, fever, night sweats.  ? ?12/02/2021, recent CBC showed hemoglobin 8.9, MCV 88.3. ?Patient has had chronic leukocytosis with white count of 11.3 on 11/25/2021.  Denies any steroid use. ? ? ?Review of Systems  ?Constitutional:  Positive for fatigue. Negative for appetite change, chills, fever and unexpected weight change.  ?HENT:   Negative for  hearing loss and voice change.   ?Eyes:  Negative for eye problems and icterus.  ?Respiratory:  Negative for chest tightness and cough.   ?Cardiovascular:  Negative for chest pain and leg swelling.  ?Gastrointestinal:  Negative for abdominal distention and abdominal pain.  ?Endocrine: Negative for hot flashes.  ?Genitourinary:  Negative for difficulty urinating, dysuria and frequency.   ?Musculoskeletal:  Negative for arthralgias.  ?Skin:  Negative for itching and rash.  ?Neurological:  Negative for light-headedness and numbness.  ?Hematological:  Negative for adenopathy. Does not bruise/bleed easily.  ?Psychiatric/Behavioral:  Negative for confusion.   ? ?MEDICAL HISTORY:  ?Past Medical History:  ?Diagnosis Date  ? Anemia   ? due to GIB, s/p transfusion  ? Anginal pain ([place]) 07/10/2018  ? Arthritis   ? back pain, much worse after consecutive golf rounds  ? Cancer [facility])   ? thyroid  ? Chronic kidney disease   ? Colon polyps   ? Coronary artery disease   ? COVID-19   ? Dyspnea   ? walking up a hill  ? Dysrhythmia 2020  ? GERD (gastroesophageal reflux disease)   ? Hypercalcemia   ? h/o, resolved as of 2012, prev due to high amount of calcium intake  ? Hyperlipidemia   ? Hypertension   ? Hypothyroidism   ? IgG gammopathy   ? stable as of 2012 per [facility]  ? Pneumonia   ? as a child  ? PVD (peripheral vascular disease) ([place])   ? L leg bypass, R leg stented  ? ? ?SURGICAL HISTORY: ?Past Surgical History:  ?Procedure Laterality Date  ?  dental implant left bottom-did have bleeding     ? ABDOMINAL AORTOGRAM W/LOWER  EXTREMITY Bilateral 06/26/2019  ? Procedure: ABDOMINAL AORTOGRAM W/LOWER EXTREMITY;  Surgeon: [name], MD;  Location: [place] CV LAB;  Service: Cardiovascular;  Laterality: Bilateral;  ? ABDOMINAL AORTOGRAM W/LOWER EXTREMITY N/A 06/08/2020  ? Procedure: ABDOMINAL AORTOGRAM W/LOWER EXTREMITY;  Surgeon: Serafina Mitchell, MD;  Location: [place] CV LAB;  Service: Cardiovascular;  Laterality: N/A;  ?  BYPASS GRAFT    ? L leg  ? CARDIAC CATHETERIZATION  06/24/2018  ? COLONOSCOPY N/A 10/09/2021  ? Procedure: COLONOSCOPY;  Surgeon: Toledo, Benay Pike, MD;  Location: [facility] ENDOSCOPY;  Service: Gastroenterology;  Laterality: N/A;  ? COLONOSCOPY WITH PROPOFOL N/A 05/04/2015  ? Procedure: COLONOSCOPY WITH PROPOFOL;  Surgeon: Lollie Sails, MD;  Location: [facility] ENDOSCOPY;  Service: Endoscopy;  Laterality: N/A;  ? CORONARY ARTERY BYPASS GRAFT N/A 07/11/2018  ? Procedure: CORONARY ARTERY BYPASS GRAFTING (CABG) x three, using left internal mammary artery and right leg greater saphenous vein harvested endoscopically;  Surgeon: Gaye Pollack, MD;  Location: [place] OR;  Service: Open Heart Surgery;  Laterality: N/A;  ? ENDARTERECTOMY FEMORAL Right 02/13/2020  ? Procedure: RIGHT ENDARTERECTOMY FEMORAL WITH BOVINE PATCH;  Surgeon: Serafina Mitchell, MD;  Location: [facility] OR;  Service: Vascular;  Laterality: Right;  ? ESOPHAGOGASTRODUODENOSCOPY N/A 10/09/2021  ? Procedure: ESOPHAGOGASTRODUODENOSCOPY (EGD);  Surgeon: Toledo, Benay Pike, MD;  Location: [facility] ENDOSCOPY;  Service: Gastroenterology;  Laterality: N/A;  ? GIVENS CAPSULE STUDY N/A 10/09/2021  ? Procedure: GIVENS CAPSULE STUDY;  Surgeon: Toledo, Benay Pike, MD;  Location: [facility] ENDOSCOPY;  Service: Gastroenterology;  Laterality: N/A;  ? LEFT HEART CATH AND CORONARY ANGIOGRAPHY N/A 06/24/2018  ? Procedure: LEFT HEART CATH AND CORONARY ANGIOGRAPHY;  Surgeon: [name], MD;  Location: [place] CV LAB;  Service: Cardiovascular;  Laterality: N/A;  ? LOWER EXTREMITY ANGIOGRAPHY Bilateral 10/25/2021  ? Procedure: Lower Extremity Angiography;  Surgeon: Serafina Mitchell, MD;  Location: [place] CV LAB;  Service: Cardiovascular;  Laterality: Bilateral;  ? PERIPHERAL VASCULAR ATHERECTOMY Right 06/08/2020  ? Procedure: PERIPHERAL VASCULAR ATHERECTOMY;  Surgeon: Serafina Mitchell, MD;  Location: [place] CV LAB;  Service: Cardiovascular;  Laterality: Right;  superficial femoral  ?  PERIPHERAL VASCULAR BALLOON ANGIOPLASTY Right 06/08/2020  ? Procedure: PERIPHERAL VASCULAR BALLOON ANGIOPLASTY;  Surgeon: Serafina Mitchell, MD;  Location: [place] CV LAB;  Service: Cardiovascular;  Laterality: Right;  External iliac  ? PERIPHERAL VASCULAR INTERVENTION Right 10/25/2021  ? Procedure: PERIPHERAL VASCULAR INTERVENTION;  Surgeon: Serafina Mitchell, MD;  Location: [place] CV LAB;  Service: Cardiovascular;  Laterality: Right;  SFA  ? RENAL ANGIOGRAPHY Bilateral 10/25/2021  ? Procedure: RENAL ANGIOGRAPHY;  Surgeon: Serafina Mitchell, MD;  Location: [place] CV LAB;  Service: Cardiovascular;  Laterality: Bilateral;  ? TEE WITHOUT CARDIOVERSION N/A 07/11/2018  ? Procedure: TRANSESOPHAGEAL ECHOCARDIOGRAM (TEE);  Surgeon: Gaye Pollack, MD;  Location: [place];  Service: Open Heart Surgery;  Laterality: N/A;  ? THYROIDECTOMY, PARTIAL  2016  ? VASCULAR SURGERY    ? ? ?SOCIAL HISTORY: ?Social History  ? ?Socioeconomic History  ? Marital status: Married  ?  Spouse name: Not on file  ? Number of children: Not on file  ? Years of education: Not on file  ? Highest education level: Not on file  ?Occupational History  ? Not on file  ?Tobacco Use  ? Smoking status: Former  ?  Types: Cigarettes  ?  Quit date: 09/04/2008  ?  Years since quitting: 13.2  ? Smokeless tobacco: Former  ?  Types: Chew  ? Tobacco comments:  ?  HAs quit tobacco products 2009  ?Vaping Use  ? Vaping Use: Never used  ?Substance and Sexual Activity  ? Alcohol use: Yes  ?  Comment: occ  ? Drug use: No  ? Sexual activity: Yes  ?Other Topics Concern  ? Not on file  ?Social History Narrative  ? Married 50+ years, 2 kids  ? Runs [employer]  ? Likes to play golf  ? ?Social Determinants of Health  ? ?Financial Resource Strain: Low Risk   ? Difficulty of Paying Living Expenses: Not hard at all  ?Food Insecurity: No Food Insecurity  ? Worried About Charity fundraiser in the Last Year: Never true  ? Ran Out of Food in the Last Year: Never true   ?Transportation Needs: No Transportation Needs  ? Lack of Transportation (Medical): No  ? Lack of Transportation (Non-Medical): No  ?Physical Activity: Inactive  ? Days of Exercise per Week: 0 days  ? Minutes of Exercise per

## 2021-12-08 ENCOUNTER — Inpatient Hospital Stay: Payer: Medicare Other

## 2021-12-08 ENCOUNTER — Telehealth: Payer: Self-pay

## 2021-12-08 LAB — KAPPA/LAMBDA LIGHT CHAINS
Kappa free light chain: 24.3 mg/L — ABNORMAL HIGH (ref 3.3–19.4)
Kappa, lambda light chain ratio: 1.71 — ABNORMAL HIGH (ref 0.26–1.65)
Lambda free light chains: 14.2 mg/L (ref 5.7–26.3)

## 2021-12-08 NOTE — Telephone Encounter (Signed)
-----   Message from Earlie Server, MD sent at 12/07/2021 10:55 PM EDT ----- ?Please let patient know that he has low iron level and I recommend IV Venofer treatment weekly x5.  Please schedule ASAP.  Prefer to start first dose this week. ?Follow-up, lab in 3 months, prior to MD +/- Venofer.  Labs are ordered.  Thank you ?

## 2021-12-08 NOTE — Telephone Encounter (Signed)
Spoke with patient and informed him of lab results and Dr. Collie Siad recommendation for IV Venofer weekly x5. Advised to follow up with her in 3 months labs prior. Patient verbalized understanding.  ? ? ?Anderson Malta, please schedule patient for: ?IV venofer weekly x5.  ?Labs in 3 months  ?MD/+/-Venofer 2 days after ?Please inform patient of appointments. Thanks ?

## 2021-12-09 LAB — COMP PANEL: LEUKEMIA/LYMPHOMA

## 2021-12-09 LAB — MULTIPLE MYELOMA PANEL, SERUM
Albumin SerPl Elph-Mcnc: 3.9 g/dL (ref 2.9–4.4)
Albumin/Glob SerPl: 1.7 (ref 0.7–1.7)
Alpha 1: 0.2 g/dL (ref 0.0–0.4)
Alpha2 Glob SerPl Elph-Mcnc: 0.6 g/dL (ref 0.4–1.0)
B-Globulin SerPl Elph-Mcnc: 0.9 g/dL (ref 0.7–1.3)
Gamma Glob SerPl Elph-Mcnc: 0.6 g/dL (ref 0.4–1.8)
Globulin, Total: 2.3 g/dL (ref 2.2–3.9)
IgA: 75 mg/dL (ref 61–437)
IgG (Immunoglobin G), Serum: 698 mg/dL (ref 603–1613)
IgM (Immunoglobulin M), Srm: 118 mg/dL (ref 15–143)
M Protein SerPl Elph-Mcnc: 2 g/dL — ABNORMAL HIGH
Total Protein ELP: 6.2 g/dL (ref 6.0–8.5)

## 2021-12-11 ENCOUNTER — Other Ambulatory Visit: Payer: Self-pay | Admitting: Oncology

## 2021-12-11 DIAGNOSIS — D729 Disorder of white blood cells, unspecified: Secondary | ICD-10-CM

## 2021-12-11 NOTE — Addendum Note (Signed)
Addended by: Earlie Server on: 12/11/2021 10:48 AM ? ? Modules accepted: Level of Service ? ?

## 2021-12-12 ENCOUNTER — Other Ambulatory Visit: Payer: Self-pay

## 2021-12-12 ENCOUNTER — Telehealth: Payer: Self-pay

## 2021-12-12 DIAGNOSIS — C9000 Multiple myeloma not having achieved remission: Secondary | ICD-10-CM

## 2021-12-12 NOTE — Telephone Encounter (Signed)
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 He will need a bone marrow biopsy work up. If he is ok with the plan, please arrange.  ?Also additional lab work up too. - ordered.  ?Follow up MD 1 week after bone marrow biopsy.  ?

## 2021-12-12 NOTE — Telephone Encounter (Signed)
Called and informed patient of lab results. Advised that well will get BMB scheduled and we would contact him with that date and time once its scheduled. Also, informed him that Dr. Tasia Catchings would like additional lab work. Advised that once his BMB is scheduled, we will schedule him to see Dr. Tasia Catchings a week after procedure. Patient verbalized understanding.  ? ? ? ?Ryan Pittman, could you ad labs to patient's infusion appt on Thursday 4/13. Patient aware. Thanks ?

## 2021-12-13 ENCOUNTER — Encounter: Payer: Self-pay | Admitting: Cardiovascular Disease

## 2021-12-13 ENCOUNTER — Ambulatory Visit (INDEPENDENT_AMBULATORY_CARE_PROVIDER_SITE_OTHER): Payer: Medicare Other | Admitting: Physician Assistant

## 2021-12-13 ENCOUNTER — Telehealth: Payer: Self-pay | Admitting: Family Medicine

## 2021-12-13 ENCOUNTER — Encounter: Payer: Self-pay | Admitting: Physician Assistant

## 2021-12-13 VITALS — BP 144/60 | HR 88 | Ht 66.0 in | Wt 156.8 lb

## 2021-12-13 DIAGNOSIS — I25810 Atherosclerosis of coronary artery bypass graft(s) without angina pectoris: Secondary | ICD-10-CM

## 2021-12-13 DIAGNOSIS — I480 Paroxysmal atrial fibrillation: Secondary | ICD-10-CM | POA: Diagnosis not present

## 2021-12-13 DIAGNOSIS — I701 Atherosclerosis of renal artery: Secondary | ICD-10-CM

## 2021-12-13 DIAGNOSIS — D649 Anemia, unspecified: Secondary | ICD-10-CM | POA: Diagnosis not present

## 2021-12-13 DIAGNOSIS — I059 Rheumatic mitral valve disease, unspecified: Secondary | ICD-10-CM

## 2021-12-13 DIAGNOSIS — I10 Essential (primary) hypertension: Secondary | ICD-10-CM | POA: Diagnosis not present

## 2021-12-13 DIAGNOSIS — I359 Nonrheumatic aortic valve disorder, unspecified: Secondary | ICD-10-CM

## 2021-12-13 DIAGNOSIS — E782 Mixed hyperlipidemia: Secondary | ICD-10-CM | POA: Diagnosis not present

## 2021-12-13 MED ORDER — METOPROLOL TARTRATE 25 MG PO TABS: 25.0000 mg | ORAL_TABLET | Freq: Two times a day (BID) | ORAL | 3 refills | Status: DC

## 2021-12-13 NOTE — Telephone Encounter (Signed)
LMTCB to discuss.

## 2021-12-13 NOTE — Telephone Encounter (Signed)
I agree with all of that.  Please update patient.  Thanks.  ?

## 2021-12-13 NOTE — Telephone Encounter (Signed)
Patient has been called since they called our office by cardiology to come in to be seen. Patient was seen and put on metoprolol BID. Patient was also advised not to go back on the blood thinner until his blood work evens back out. Patient is scheduled for bone marrow biopsy on 12/21/21. Patient wants to make sure Dr. Damita Dunnings is okay with cardio recommendations.  ?

## 2021-12-13 NOTE — Progress Notes (Signed)
?Cardiology Office Note:   ? ?Date:  12/15/2021  ? ?ID:  [name], DOB Sep 13, 1940, MRN [id number] ? ?PCP:  Tonia Ghent, MD ?  ?[facility] Providers ?Cardiologist:  [name], MD ?Cardiology APP:  Deberah Pelton, NP    ? ?Referring MD: Tonia Ghent, MD  ? ?Chief Complaint  ?Patient presents with  ? Follow-up  ?  Seen for Dr. Gwenlyn Found  ? ? ?History of Present Illness:   ? ?Ryan Pittman is a 81 y.o. male with a hx of CAD s/p CABG, hypertension, hyperlipidemia, hypothyroidism, IgG gammopathy and history of PAD.  He has a history of tobacco abuse.  He has never had a heart attack or stroke.  He did have a history of left femoral-popliteal bypass graft by Dr. Lucky Cowboy in [place] several years ago.  Patient had a positive stress test and a subsequent coronary CTA on 05/24/2018 that revealed elevated calcium score of 2858 with three-vessel calcification.  He ultimately underwent cardiac catheterization shortly after that revealed three-vessel disease with preserved EF.  He underwent CABG x5 by Dr. Cyndia Bent.  He was placed on Repatha for his lipid profile.  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  He ultimately underwent right femoral enterectomy and patch angioplasty by Dr. Trula Slade.  He was later diagnosed with atrial fibrillation with RVR when he presented to [facility] with chest pain.  He was placed on Eliquis therapy.  Patient was last seen by Dr. Gwenlyn Found in January 2023 at which time he was doing well.  He debated whether or not to leave the patient on anticoagulation therapy, he eventually recommended he continue on the Eliquis.  2 weeks heart monitor obtained in January 2023 showed sinus rhythm with rare short  episode of SVT, occasional PACs and PVCs but no obvious atrial fibrillation.  His Plavix was discontinued and aspirin added.  Patient ultimately underwent Myoview on 11/18/2021 that showed EF 62%, no evidence of infarction or ischemia, overall low risk study.  Renal artery Doppler obtained in January 2023 demonstrated greater than 60% stenosis in the left and renal artery.  Repeat Doppler recommended in 12 months.  Patient did notice to have a 70 to 99% stenosis in the SMA.  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  Dr. Gwenlyn Found initially recommended MRI, however after discussing the case with the patient's PCP, patient apparently had the same cystic structure on the previous imaging studies while, therefore it was recommended for 37-monthrepeat ultrasound instead.  Patient underwent overlapping stents of right SFA by Dr. BTrula Sladeon 10/25/2021.  No significant renal artery stenosis was identified at that time.  Due to progressive anemia, Eliquis was temporarily taken off in March 2023.  He is currently being evaluated by hematology and oncology service, who felt iron deficiency is likely related to GI bleed.  He previously has underwent multiple GI evaluation earlier this year.  Multiple myeloma panel was somewhat abnormal, he is due for a bone marrow biopsy to review the result.  ? ?Patient presents today for follow-up.  He has upcoming bone marrow biopsy at the recommendation of his hematologist.  He denies any recent chest pain worsening dyspnea.  He is still not  on Eliquis at this time.  Recent blood work shows his anemia has mildly improved, last hemoglobin was 9.2.  Recent EKG obtained in March showed a normal sinus rhythm.  It does not appears to me he has had recent atrial fibrillation.  Given the recent significant anemia, I recommend hold off on restarting Eliquis at this time.  Blood pressure is high, I will increase his metoprolol to 25 mg twice a day.  He was  previously on losartan, if it is okay with nephrology service, I would like to restart losartan and 25 mg daily.  He is no longer on amlodipine.  I recommend 6-week follow-up, if he is hemoglobin increases further, may try to rechallenge him with Eliquis again.  I did start him on a course of Protonix for GI protection. ? ?Past Medical History:  ?Diagnosis Date  ? Anemia   ? due to GIB, s/p transfusion  ? Anginal pain ([place]) 07/10/2018  ? Arthritis   ? back pain, much worse after consecutive golf rounds  ? Cancer [facility])   ? thyroid  ? Chronic kidney disease   ? Colon polyps   ? Coronary artery disease   ? COVID-19   ? Dyspnea   ? walking up a hill  ? Dysrhythmia 2020  ? GERD (gastroesophageal reflux disease)   ? Hypercalcemia   ? h/o, resolved as of 2012, prev due to high amount of calcium intake  ? Hyperlipidemia   ? Hypertension   ? Hypothyroidism   ? IgG gammopathy   ? stable as of 2012 per [facility]  ? Pneumonia   ? as a child  ? PVD (peripheral vascular disease) ([place])   ? L leg bypass, R leg stented  ? ? ?Past Surgical History:  ?Procedure Laterality Date  ?  dental implant left bottom-did have bleeding     ? ABDOMINAL AORTOGRAM W/LOWER EXTREMITY Bilateral 06/26/2019  ? Procedure: ABDOMINAL AORTOGRAM W/LOWER EXTREMITY;  Surgeon: [name], MD;  Location: [place] [facility];  Service: Cardiovascular;  Laterality: Bilateral;  ? ABDOMINAL AORTOGRAM W/LOWER EXTREMITY N/A 06/08/2020  ? Procedure: ABDOMINAL AORTOGRAM W/LOWER EXTREMITY;  Surgeon: Serafina Mitchell, MD;  Location: [place] [facility];  Service: Cardiovascular;  Laterality: N/A;  ? BYPASS GRAFT    ? L leg  ? CARDIAC CATHETERIZATION  06/24/2018  ? COLONOSCOPY N/A 10/09/2021  ? Procedure: COLONOSCOPY;  Surgeon: Toledo, Benay Pike, MD;  Location: [facility] ENDOSCOPY;  Service: Gastroenterology;  Laterality: N/A;  ? COLONOSCOPY WITH PROPOFOL N/A 05/04/2015  ? Procedure: COLONOSCOPY WITH PROPOFOL;  Surgeon: Lollie Sails, MD;  Location: [facility] ENDOSCOPY;  Service:  Endoscopy;  Laterality: N/A;  ? CORONARY ARTERY BYPASS GRAFT N/A 07/11/2018  ? Procedure: CORONARY ARTERY BYPASS GRAFTING (CABG) x three, using left internal mammary artery and right leg greater saphenous vein harvested endoscopically;  Surgeon: Gaye Pollack, MD;  Location: [place] OR;  Service: Open Heart Surgery;  Laterality: N/A;  ? ENDARTERECTOMY FEMORAL Right 02/13/2020  ? Procedure: RIGHT ENDARTERECTOMY FEMORAL WITH BOVINE PATCH;  Surgeon: Serafina Mitchell, MD;  Location: [facility] OR;  Service: Vascular;  Laterality: Right;  ? ESOPHAGOGASTRODUODENOSCOPY N/A 10/09/2021  ? Procedure: ESOPHAGOGASTRODUODENOSCOPY (EGD);  Surgeon: Toledo, Benay Pike, MD;  Location: [facility] ENDOSCOPY;  Service: Gastroenterology;  Laterality: N/A;  ? GIVENS CAPSULE STUDY N/A 10/09/2021  ? Procedure: GIVENS CAPSULE STUDY;  Surgeon: Toledo, Benay Pike, MD;  Location: [facility] ENDOSCOPY;  Service: Gastroenterology;  Laterality: N/A;  ? LEFT HEART CATH AND CORONARY ANGIOGRAPHY N/A 06/24/2018  ?  Procedure: LEFT HEART CATH AND CORONARY ANGIOGRAPHY;  Surgeon: [name], MD;  Location: [place] [facility];  Service: Cardiovascular;  Laterality: N/A;  ? LOWER EXTREMITY ANGIOGRAPHY Bilateral 10/25/2021  ? Procedure: Lower Extremity Angiography;  Surgeon: Serafina Mitchell, MD;  Location: [place] [facility];  Service: Cardiovascular;  Laterality: Bilateral;  ? PERIPHERAL VASCULAR ATHERECTOMY Right 06/08/2020  ? Procedure: PERIPHERAL VASCULAR ATHERECTOMY;  Surgeon: Serafina Mitchell, MD;  Location: [place] [facility];  Service: Cardiovascular;  Laterality: Right;  superficial femoral  ? PERIPHERAL VASCULAR BALLOON ANGIOPLASTY Right 06/08/2020  ? Procedure: PERIPHERAL VASCULAR BALLOON ANGIOPLASTY;  Surgeon: Serafina Mitchell, MD;  Location: [place] [facility];  Service: Cardiovascular;  Laterality: Right;  External iliac  ? PERIPHERAL VASCULAR INTERVENTION Right 10/25/2021  ? Procedure: PERIPHERAL VASCULAR INTERVENTION;  Surgeon: Serafina Mitchell, MD;  Location: [place] [facility];  Service: Cardiovascular;  Laterality: Right;  SFA  ? RENAL ANGIOGRAPHY Bilateral 10/25/2021  ? Procedure: RENAL ANGIOGRAPHY;  Surgeon: Serafina Mitchell, MD;  Location: [place] [facility];  Service

## 2021-12-13 NOTE — Telephone Encounter (Signed)
Pt wife called asking for a call back to discuss if pt needs to go back on his BP medication that he was told to come off of. Pt wife also asking if pt should go back on his Blood Thinners. Please advise. ?

## 2021-12-13 NOTE — Patient Instructions (Addendum)
Medication Instructions:  ?INCREASE Metoprolol Tartrate to 25 mg 2 times a day  ?START Protonix (Pantoprazole) 40 mg daily  ?*If you need a refill on your cardiac medications before your next appointment, please call your pharmacy* ? ?Lab Work: ?NONE ordered at this time of appointment  ? ?If you have labs (blood work) drawn today and your tests are completely normal, you will receive your results only by: ?MyChart Message (if you have MyChart) OR ?A paper copy in the mail ?If you have any lab test that is abnormal or we need to change your treatment, we will call you to review the results. ? ?Testing/Procedures: ?NONE ordered at this time of appointment  ? ?Follow-Up: ?At [facility], you and your health needs are our priority.  As part of our continuing mission to provide you with exceptional heart care, we have created designated Provider Care Teams.  These Care Teams include your primary Cardiologist (physician) and Advanced Practice Providers (APPs -  Physician Assistants and Nurse Practitioners) who all work together to provide you with the care you need, when you need it. ? ?Your next appointment:   ?6 week(s) ? ?The format for your next appointment:   ?In Person ? ?Provider:   ?Almyra Deforest, PA-C     on a day that Dr. Gwenlyn Found is in the office  ? ? ?Other Instructions ?Call your Kidney doctor to ask about the addition of Losartan 25 mg daily  ? ?Important Information About Sugar ? ? ? ? ? ? ?

## 2021-12-14 ENCOUNTER — Telehealth: Payer: Self-pay | Admitting: Cardiovascular Disease

## 2021-12-14 MED ORDER — PANTOPRAZOLE SODIUM 40 MG PO TBEC: 40.0000 mg | DELAYED_RELEASE_TABLET | Freq: Every day | ORAL | 3 refills | Status: DC

## 2021-12-14 NOTE — Telephone Encounter (Signed)
Wife calling in bout the acid reflex for patient stomach that was supposed to be called from [place]. Please advise ?

## 2021-12-14 NOTE — Telephone Encounter (Signed)
Patient has been updated.

## 2021-12-14 NOTE — Telephone Encounter (Signed)
Patient Ryan Pittman was called and informed that his Protonix (Pantoprazole) will be sent to his pharmacy-[facility] a 90 day supply with 3 refills. Patient verbalized understanding and all (if any) questions were answered.  ?

## 2021-12-15 ENCOUNTER — Inpatient Hospital Stay: Payer: Medicare Other

## 2021-12-15 ENCOUNTER — Encounter: Payer: Self-pay | Admitting: Physician Assistant

## 2021-12-15 VITALS — BP 139/51 | HR 59 | Temp 98.2°F | Resp 16

## 2021-12-15 DIAGNOSIS — D472 Monoclonal gammopathy: Secondary | ICD-10-CM | POA: Diagnosis not present

## 2021-12-15 DIAGNOSIS — D649 Anemia, unspecified: Secondary | ICD-10-CM | POA: Diagnosis not present

## 2021-12-15 DIAGNOSIS — M48062 Spinal stenosis, lumbar region with neurogenic claudication: Secondary | ICD-10-CM | POA: Diagnosis not present

## 2021-12-15 DIAGNOSIS — D72829 Elevated white blood cell count, unspecified: Secondary | ICD-10-CM | POA: Diagnosis not present

## 2021-12-15 DIAGNOSIS — D729 Disorder of white blood cells, unspecified: Secondary | ICD-10-CM

## 2021-12-15 DIAGNOSIS — D509 Iron deficiency anemia, unspecified: Secondary | ICD-10-CM | POA: Diagnosis not present

## 2021-12-15 DIAGNOSIS — I4891 Unspecified atrial fibrillation: Secondary | ICD-10-CM | POA: Diagnosis not present

## 2021-12-15 DIAGNOSIS — I739 Peripheral vascular disease, unspecified: Secondary | ICD-10-CM | POA: Diagnosis not present

## 2021-12-15 DIAGNOSIS — D62 Acute posthemorrhagic anemia: Secondary | ICD-10-CM

## 2021-12-15 LAB — LACTATE DEHYDROGENASE: LDH: 114 U/L (ref 98–192)

## 2021-12-15 MED ORDER — IRON SUCROSE 20 MG/ML IV SOLN
200.0000 mg | Freq: Once | INTRAVENOUS | Status: AC
  Administered 2021-12-15: 200 mg via INTRAVENOUS
  Filled 2021-12-15: qty 10

## 2021-12-15 MED ORDER — SODIUM CHLORIDE 0.9 % IV SOLN
Freq: Once | INTRAVENOUS | Status: AC
  Filled 2021-12-15: qty 250

## 2021-12-15 MED ORDER — SODIUM CHLORIDE 0.9 % IV SOLN: 200.0000 mg | Freq: Once | INTRAVENOUS | Status: DC

## 2021-12-15 NOTE — Progress Notes (Signed)
Patient monitored x 15 minutes post Venofer treatment. Patient tolerated treatment well. Advised patient to reach out to clinic should he have any questions or concerns. Patient verbalizes understanding and denies any further questions or concerns.  ?

## 2021-12-16 ENCOUNTER — Ambulatory Visit: Payer: Medicare Other | Admitting: Physician Assistant

## 2021-12-16 LAB — BETA 2 MICROGLOBULIN, SERUM: Beta-2 Microglobulin: 2.3 mg/L (ref 0.6–2.4)

## 2021-12-19 NOTE — Progress Notes (Signed)
Spoke with patient 12/19/21 @ 11:35 am. Ryan Pittman over pre-procedure instructions to include the need to arrive @ 7:30 for 8:30 procedure, need to be NPO after midnight and need for driver post procedure. Patient confirmed he is not on any blood thinners or diabetes medications. Patient verbalized understanding. ?

## 2021-12-20 ENCOUNTER — Other Ambulatory Visit: Payer: Self-pay | Admitting: Radiology

## 2021-12-20 DIAGNOSIS — D2261 Melanocytic nevi of right upper limb, including shoulder: Secondary | ICD-10-CM | POA: Diagnosis not present

## 2021-12-20 DIAGNOSIS — C44319 Basal cell carcinoma of skin of other parts of face: Secondary | ICD-10-CM | POA: Diagnosis not present

## 2021-12-20 DIAGNOSIS — D2262 Melanocytic nevi of left upper limb, including shoulder: Secondary | ICD-10-CM | POA: Diagnosis not present

## 2021-12-20 DIAGNOSIS — Z85828 Personal history of other malignant neoplasm of skin: Secondary | ICD-10-CM | POA: Diagnosis not present

## 2021-12-20 DIAGNOSIS — D2271 Melanocytic nevi of right lower limb, including hip: Secondary | ICD-10-CM | POA: Diagnosis not present

## 2021-12-20 DIAGNOSIS — L570 Actinic keratosis: Secondary | ICD-10-CM | POA: Diagnosis not present

## 2021-12-20 DIAGNOSIS — X32XXXA Exposure to sunlight, initial encounter: Secondary | ICD-10-CM | POA: Diagnosis not present

## 2021-12-20 DIAGNOSIS — D485 Neoplasm of uncertain behavior of skin: Secondary | ICD-10-CM | POA: Diagnosis not present

## 2021-12-21 ENCOUNTER — Ambulatory Visit
Admission: RE | Admit: 2021-12-21 | Discharge: 2021-12-21 | Disposition: A | Discharge status: Home / Self Care | Payer: Medicare Other | Source: Ambulatory Visit | Attending: Oncology | Admitting: Oncology

## 2021-12-21 ENCOUNTER — Other Ambulatory Visit: Payer: Self-pay

## 2021-12-21 DIAGNOSIS — D72829 Elevated white blood cell count, unspecified: Secondary | ICD-10-CM | POA: Insufficient documentation

## 2021-12-21 DIAGNOSIS — D509 Iron deficiency anemia, unspecified: Secondary | ICD-10-CM | POA: Diagnosis not present

## 2021-12-21 DIAGNOSIS — C9000 Multiple myeloma not having achieved remission: Secondary | ICD-10-CM | POA: Diagnosis not present

## 2021-12-21 DIAGNOSIS — D649 Anemia, unspecified: Secondary | ICD-10-CM | POA: Insufficient documentation

## 2021-12-21 DIAGNOSIS — D472 Monoclonal gammopathy: Secondary | ICD-10-CM | POA: Diagnosis not present

## 2021-12-21 DIAGNOSIS — I4891 Unspecified atrial fibrillation: Secondary | ICD-10-CM | POA: Diagnosis not present

## 2021-12-21 DIAGNOSIS — I739 Peripheral vascular disease, unspecified: Secondary | ICD-10-CM | POA: Diagnosis not present

## 2021-12-21 LAB — CBC WITH DIFFERENTIAL/PLATELET
Abs Immature Granulocytes: 0.22 10*3/uL — ABNORMAL HIGH (ref 0.00–0.07)
Basophils Absolute: 0.1 10*3/uL (ref 0.0–0.1)
Basophils Relative: 1 %
Eosinophils Absolute: 0.3 10*3/uL (ref 0.0–0.5)
Eosinophils Relative: 3 %
HCT: 27.5 % — ABNORMAL LOW (ref 39.0–52.0)
Hemoglobin: 8.7 g/dL — ABNORMAL LOW (ref 13.0–17.0)
Immature Granulocytes: 2 %
Lymphocytes Relative: 13 %
Lymphs Abs: 1.5 10*3/uL (ref 0.7–4.0)
MCH: 27.7 pg (ref 26.0–34.0)
MCHC: 31.6 g/dL (ref 30.0–36.0)
MCV: 87.6 fL (ref 80.0–100.0)
Monocytes Absolute: 0.9 10*3/uL (ref 0.1–1.0)
Monocytes Relative: 7 %
Neutro Abs: 9.2 10*3/uL — ABNORMAL HIGH (ref 1.7–7.7)
Neutrophils Relative %: 74 %
Platelets: 265 10*3/uL (ref 150–400)
RBC: 3.14 MIL/uL — ABNORMAL LOW (ref 4.22–5.81)
RDW: 17.4 % — ABNORMAL HIGH (ref 11.5–15.5)
WBC: 12.3 10*3/uL — ABNORMAL HIGH (ref 4.0–10.5)
nRBC: 0 % (ref 0.0–0.2)

## 2021-12-21 MED ORDER — MIDAZOLAM HCL 2 MG/2ML IJ SOLN
INTRAMUSCULAR | Status: AC
  Filled 2021-12-21: qty 2

## 2021-12-21 MED ORDER — SODIUM CHLORIDE 0.9 % IV SOLN: INTRAVENOUS | Status: DC

## 2021-12-21 MED ORDER — HEPARIN SOD (PORK) LOCK FLUSH 100 UNIT/ML IV SOLN
INTRAVENOUS | Status: AC
  Filled 2021-12-21: qty 5

## 2021-12-21 MED ORDER — FENTANYL CITRATE (PF) 100 MCG/2ML IJ SOLN
INTRAMUSCULAR | Status: AC
Start: 2021-12-21 — End: 2021-12-21
  Filled 2021-12-21: qty 2

## 2021-12-21 MED ORDER — FENTANYL CITRATE (PF) 100 MCG/2ML IJ SOLN
INTRAMUSCULAR | Status: AC | PRN
  Administered 2021-12-21: 50 ug via INTRAVENOUS

## 2021-12-21 MED ORDER — MIDAZOLAM HCL 2 MG/2ML IJ SOLN
INTRAMUSCULAR | Status: AC | PRN
  Administered 2021-12-21: 1 mg via INTRAVENOUS

## 2021-12-21 NOTE — H&P (Signed)
? ?Chief Complaint: ?Patient was seen in consultation today for multiple myeloma at the request of Yu,Zhou ? ?Referring Physician(s): ?Yu,Zhou ? ?Supervising Physician: Ruthann Cancer ? ?Patient Status: [place] ? ?History of Present Illness: ?Ryan Pittman is a 81 y.o. male with PMHx significant for anemia and leukocytosis who had been seen by oncology 4/5 with concern for multiple myeloma given recent labs. Patient presents today for elective image guided bone marrow biopsy with moderate sedation.  ? ?The patient has had a H&P performed within the last 30 days, all history, medications, and exam have been reviewed. The patient denies any interval changes since the H&P. ? ?The patient denies any current chest pain or shortness of breath. The patient denies any history of sleep apnea or chronic oxygen use. He has no known complications to sedation.  ? ? ?Past Medical History:  ?Diagnosis Date  ? Anemia   ? due to GIB, s/p transfusion  ? Anginal pain ([place]) 07/10/2018  ? Arthritis   ? back pain, much worse after consecutive golf rounds  ? Cancer [facility])   ? thyroid  ? Chronic kidney disease   ? Colon polyps   ? Coronary artery disease   ? COVID-19   ? Dyspnea   ? walking up a hill  ? Dysrhythmia 2020  ? GERD (gastroesophageal reflux disease)   ? Hypercalcemia   ? h/o, resolved as of 2012, prev due to high amount of calcium intake  ? Hyperlipidemia   ? Hypertension   ? Hypothyroidism   ? IgG gammopathy   ? stable as of 2012 per [facility]  ? Pneumonia   ? as a child  ? PVD (peripheral vascular disease) ([place])   ? L leg bypass, R leg stented  ? ? ?Past Surgical History:  ?Procedure Laterality Date  ?  dental implant left bottom-did have bleeding     ? ABDOMINAL AORTOGRAM W/LOWER EXTREMITY Bilateral 06/26/2019  ? Procedure: ABDOMINAL AORTOGRAM W/LOWER EXTREMITY;  Surgeon: [name], MD;  Location: [place] [facility];  Service: Cardiovascular;  Laterality: Bilateral;  ? ABDOMINAL AORTOGRAM W/LOWER EXTREMITY N/A  06/08/2020  ? Procedure: ABDOMINAL AORTOGRAM W/LOWER EXTREMITY;  Surgeon: Serafina Mitchell, MD;  Location: [place] [facility];  Service: Cardiovascular;  Laterality: N/A;  ? BYPASS GRAFT    ? L leg  ? CARDIAC CATHETERIZATION  06/24/2018  ? COLONOSCOPY N/A 10/09/2021  ? Procedure: COLONOSCOPY;  Surgeon: Toledo, Benay Pike, MD;  Location: [facility] ENDOSCOPY;  Service: Gastroenterology;  Laterality: N/A;  ? COLONOSCOPY WITH PROPOFOL N/A 05/04/2015  ? Procedure: COLONOSCOPY WITH PROPOFOL;  Surgeon: Lollie Sails, MD;  Location: [facility] ENDOSCOPY;  Service: Endoscopy;  Laterality: N/A;  ? CORONARY ARTERY BYPASS GRAFT N/A 07/11/2018  ? Procedure: CORONARY ARTERY BYPASS GRAFTING (CABG) x three, using left internal mammary artery and right leg greater saphenous vein harvested endoscopically;  Surgeon: Gaye Pollack, MD;  Location: [place] OR;  Service: Open Heart Surgery;  Laterality: N/A;  ? ENDARTERECTOMY FEMORAL Right 02/13/2020  ? Procedure: RIGHT ENDARTERECTOMY FEMORAL WITH BOVINE PATCH;  Surgeon: Serafina Mitchell, MD;  Location: [facility] OR;  Service: Vascular;  Laterality: Right;  ? ESOPHAGOGASTRODUODENOSCOPY N/A 10/09/2021  ? Procedure: ESOPHAGOGASTRODUODENOSCOPY (EGD);  Surgeon: Toledo, Benay Pike, MD;  Location: [facility] ENDOSCOPY;  Service: Gastroenterology;  Laterality: N/A;  ? GIVENS CAPSULE STUDY N/A 10/09/2021  ? Procedure: GIVENS CAPSULE STUDY;  Surgeon: Toledo, Benay Pike, MD;  Location: [facility] ENDOSCOPY;  Service: Gastroenterology;  Laterality: N/A;  ? LEFT HEART CATH  AND CORONARY ANGIOGRAPHY N/A 06/24/2018  ? Procedure: LEFT HEART CATH AND CORONARY ANGIOGRAPHY;  Surgeon: [name], MD;  Location: [place] [facility];  Service: Cardiovascular;  Laterality: N/A;  ? LOWER EXTREMITY ANGIOGRAPHY Bilateral 10/25/2021  ? Procedure: Lower Extremity Angiography;  Surgeon: Serafina Mitchell, MD;  Location: [place] [facility] LAB;  Service: Cardiovascular;  Laterality: Bilateral;  ? PERIPHERAL VASCULAR ATHERECTOMY Right 06/08/2020  ? Procedure:  PERIPHERAL VASCULAR ATHERECTOMY;  Surgeon: Serafina Mitchell, MD;  Location: [place] [facility] LAB;  Service: Cardiovascular;  Laterality: Right;  superficial femoral  ? PERIPHERAL VASCULAR BALLOON ANGIOPLASTY Right 06/08/2020  ? Procedure: PERIPHERAL VASCULAR BALLOON ANGIOPLASTY;  Surgeon: Serafina Mitchell, MD;  Location: [place] [facility];  Service: Cardiovascular;  Laterality: Right;  External iliac  ? PERIPHERAL VASCULAR INTERVENTION Right 10/25/2021  ? Procedure: PERIPHERAL VASCULAR INTERVENTION;  Surgeon: Serafina Mitchell, MD;  Location: [place] [facility] LAB;  Service: Cardiovascular;  Laterality: Right;  SFA  ? RENAL ANGIOGRAPHY Bilateral 10/25/2021  ? Procedure: RENAL ANGIOGRAPHY;  Surgeon: Serafina Mitchell, MD;  Location: [place] [facility];  Service: Cardiovascular;  Laterality: Bilateral;  ? TEE WITHOUT CARDIOVERSION N/A 07/11/2018  ? Procedure: TRANSESOPHAGEAL ECHOCARDIOGRAM (TEE);  Surgeon: Gaye Pollack, MD;  Location: [facility];  Service: Open Heart Surgery;  Laterality: N/A;  ? THYROIDECTOMY, PARTIAL  2016  ? VASCULAR SURGERY    ? ? ?Allergies: ?Penicillins, Ace inhibitors, Aspirin, Celebrex [celecoxib], and Lipitor [atorvastatin] ? ?Medications: ?Prior to Admission medications   ?Medication Sig Start Date End Date Taking? Authorizing Provider  ?acetaminophen (TYLENOL) 500 MG tablet Take 1,000 mg by mouth every 6 (six) hours as needed for moderate pain or headache.   Yes [provider]  ?Ascorbic Acid (VITAMIN C) 1000 MG tablet Take 1,000 mg by mouth daily.   Yes [provider]  ?cholecalciferol (VITAMIN D3) 25 MCG (1000 UNIT) tablet Take 1,000 Units by mouth daily.   Yes [provider]  ?hydrOXYzine (ATARAX) 10 MG tablet Take 5-10 mg by mouth 3 (three) times daily as needed for anxiety (sleep). 11/08/21  Yes [provider]  ?levothyroxine (SYNTHROID) 75 MCG tablet TAKE 1 TABLET BY MOUTH DAILY EXCEPT ON THURSDAYS TAKE 1.5 TABLETS 09/29/21  Yes Tonia Ghent, MD  ?losartan  (COZAAR) 25 MG tablet Take 1 tablet by mouth in the morning and at bedtime. 12/14/21 12/14/22 Yes [provider]  ?metoprolol tartrate (LOPRESSOR) 25 MG tablet Take 1 tablet (25 mg total) by mouth 2 (two) times daily. 12/13/21  Yes Almyra Deforest, [place]  ?Multiple Vitamins-Minerals (MULTIVITAMIN MEN 50+) TABS Take 1 tablet by mouth daily.   Yes [provider]  ?pantoprazole (PROTONIX) 40 MG tablet Take 1 tablet (40 mg total) by mouth daily. 12/14/21  Yes Almyra Deforest, PA  ?pramipexole (MIRAPEX) 1 MG tablet Take 1 tablet (1 mg total) by mouth daily. 12/30/20  Yes Tonia Ghent, MD  ?REPATHA SURECLICK 542 MG/ML SOAJ INJECT 140MG EVERY 14 DAYS 07/25/21  Yes [name], MD  ?tamsulosin (FLOMAX) 0.4 MG CAPS capsule Take 1 capsule (0.4 mg total) by mouth daily. 11/07/21  Yes Stoioff, Ronda Fairly, MD  ?  ? ?Family History  ?Problem Relation Age of Onset  ? Diabetes Mother   ? Stroke Father   ? Colon cancer Neg Hx   ? Prostate cancer Neg Hx   ? ? ?Social History  ? ?Socioeconomic History  ? Marital status: Married  ?  Spouse name: Not on file  ?  Number of children: Not on file  ? Years of education: Not on file  ? Highest education level: Not on file  ?Occupational History  ? Not on file  ?Tobacco Use  ? Smoking status: Former  ?  Types: Cigarettes  ?  Quit date: 09/04/2008  ?  Years since quitting: 13.3  ? Smokeless tobacco: Former  ?  Types: Chew  ? Tobacco comments:  ?  HAs quit tobacco products 2009  ?Vaping Use  ? Vaping Use: Never used  ?Substance and Sexual Activity  ? Alcohol use: Yes  ?  Comment: occ  ? Drug use: No  ? Sexual activity: Yes  ?Other Topics Concern  ? Not on file  ?Social History Narrative  ? Married 50+ years, 2 kids  ? Runs [employer]  ? Likes to play golf  ? ?Social Determinants of Health  ? ?Financial Resource Strain: Low Risk   ? Difficulty of Paying Living Expenses: Not hard at all  ?Food Insecurity: No Food Insecurity  ? Worried About Charity fundraiser in the Last Year: Never true   ? Ran Out of Food in the Last Year: Never true  ?Transportation Needs: No Transportation Needs  ? Lack of Transportation (Medical): No  ? Lack of Transportation (Non-Medical): No  ?Physical Activity:

## 2021-12-21 NOTE — Procedures (Signed)
Interventional Radiology Procedure Note  Procedure: CT guided aspirate and core biopsy of right iliac bone  Complications: None  Recommendations: - Bedrest supine x 1 hrs - Hydrocodone PRN  Pain - Follow biopsy results   Ryan Marquard, MD   

## 2021-12-21 NOTE — Progress Notes (Signed)
Patient clinically stable post BMB per DR Suttle, vitals stable pre and post procedure. Received Versed 1 mg along with Fentanyl 50 mcg IV for procedure. Denies complaints at present. Report given to [facility] RN post procedure/specials. ?

## 2021-12-22 ENCOUNTER — Ambulatory Visit (INDEPENDENT_AMBULATORY_CARE_PROVIDER_SITE_OTHER): Payer: Medicare Other

## 2021-12-22 ENCOUNTER — Other Ambulatory Visit: Payer: Self-pay

## 2021-12-22 ENCOUNTER — Inpatient Hospital Stay: Payer: Medicare Other

## 2021-12-22 VITALS — BP 161/51 | HR 68 | Temp 96.8°F | Resp 20

## 2021-12-22 VITALS — Ht 67.0 in | Wt 156.0 lb

## 2021-12-22 DIAGNOSIS — D472 Monoclonal gammopathy: Secondary | ICD-10-CM | POA: Diagnosis not present

## 2021-12-22 DIAGNOSIS — D509 Iron deficiency anemia, unspecified: Secondary | ICD-10-CM | POA: Diagnosis not present

## 2021-12-22 DIAGNOSIS — D649 Anemia, unspecified: Secondary | ICD-10-CM | POA: Diagnosis not present

## 2021-12-22 DIAGNOSIS — D729 Disorder of white blood cells, unspecified: Secondary | ICD-10-CM

## 2021-12-22 DIAGNOSIS — I739 Peripheral vascular disease, unspecified: Secondary | ICD-10-CM | POA: Diagnosis not present

## 2021-12-22 DIAGNOSIS — Z0000 Encounter for general adult medical examination without abnormal findings: Secondary | ICD-10-CM

## 2021-12-22 DIAGNOSIS — D62 Acute posthemorrhagic anemia: Secondary | ICD-10-CM

## 2021-12-22 DIAGNOSIS — I4891 Unspecified atrial fibrillation: Secondary | ICD-10-CM | POA: Diagnosis not present

## 2021-12-22 DIAGNOSIS — D72829 Elevated white blood cell count, unspecified: Secondary | ICD-10-CM | POA: Diagnosis not present

## 2021-12-22 MED ORDER — SODIUM CHLORIDE 0.9 % IV SOLN: 200.0000 mg | Freq: Once | INTRAVENOUS | Status: DC

## 2021-12-22 MED ORDER — IRON SUCROSE 20 MG/ML IV SOLN
200.0000 mg | Freq: Once | INTRAVENOUS | Status: AC
  Administered 2021-12-22: 200 mg via INTRAVENOUS
  Filled 2021-12-22: qty 10

## 2021-12-22 MED ORDER — SODIUM CHLORIDE 0.9 % IV SOLN
Freq: Once | INTRAVENOUS | Status: AC
  Filled 2021-12-22: qty 250

## 2021-12-22 NOTE — Progress Notes (Signed)
?Virtual Visit via Telephone Note ? ?I connected with  Ryan Pittman on 12/22/21 at  3:30 PM EDT by telephone and verified that I am speaking with the correct person using two identifiers. ? ?Location: ?Patient: home ?Provider: [place] ?Persons participating in the virtual visit: patient/Nurse Health Advisor ?  ?I discussed the limitations, risks, security and privacy concerns of performing an evaluation and management service by telephone and the availability of in person appointments. The patient expressed understanding and agreed to proceed. ? ?Interactive audio and video telecommunications were attempted between this nurse and patient, however failed, due to patient having technical difficulties OR patient did not have access to video capability.  We continued and completed visit with audio only. ? ?Some vital signs may be absent or patient reported.  ? ?Ryan David, LPN ? ?Subjective:  ? Ryan Pittman is a 81 y.o. male who presents for Medicare Annual/Subsequent preventive examination. ? ?Review of Systems    ? ?  ? ?   ?Objective:  ?  ?Today's Vitals  ? 12/22/21 1528  ?PainSc: 0-No pain  ? ?There is no height or weight on file to calculate BMI. ? ? ?  12/22/2021  ?  1:00 PM 12/07/2021  ? 11:30 AM 10/25/2021  ?  5:50 AM 10/09/2021  ?  9:47 AM 10/07/2021  ? 12:07 PM 08/25/2021  ?  3:02 AM 12/21/2020  ? 12:01 PM  ?Advanced Directives  ?Does Patient Have a Medical Advance Directive? No No No Yes No No No  ?Does patient want to make changes to medical advance directive? No - Patient declined No - Patient declined No - Patient declined      ?Would patient like information on creating a medical advance directive?   No - Patient declined  No - Patient declined No - Patient declined No - Patient declined  ? ? ?Current Medications (verified) ?Outpatient Encounter Medications as of 12/22/2021  ?Medication Sig  ? acetaminophen (TYLENOL) 500 MG tablet Take 1,000 mg by mouth every 6 (six) hours as needed for  moderate pain or headache.  ? Ascorbic Acid (VITAMIN C) 1000 MG tablet Take 1,000 mg by mouth daily.  ? cholecalciferol (VITAMIN D3) 25 MCG (1000 UNIT) tablet Take 1,000 Units by mouth daily.  ? hydrOXYzine (ATARAX) 10 MG tablet Take 5-10 mg by mouth 3 (three) times daily as needed for anxiety (sleep).  ? levothyroxine (SYNTHROID) 75 MCG tablet TAKE 1 TABLET BY MOUTH DAILY EXCEPT ON THURSDAYS TAKE 1.5 TABLETS  ? losartan (COZAAR) 25 MG tablet Take 1 tablet by mouth in the morning and at bedtime.  ? metoprolol tartrate (LOPRESSOR) 25 MG tablet Take 1 tablet (25 mg total) by mouth 2 (two) times daily.  ? Multiple Vitamins-Minerals (MULTIVITAMIN MEN 50+) TABS Take 1 tablet by mouth daily.  ? pantoprazole (PROTONIX) 40 MG tablet Take 1 tablet (40 mg total) by mouth daily.  ? pramipexole (MIRAPEX) 1 MG tablet Take 1 tablet (1 mg total) by mouth daily.  ? pramipexole (MIRAPEX) 1 MG tablet Take 1 tablet by mouth 3 (three) times daily.  ? REPATHA SURECLICK 782 MG/ML SOAJ INJECT '140MG'$  EVERY 14 DAYS  ? tamsulosin (FLOMAX) 0.4 MG CAPS capsule Take 1 capsule (0.4 mg total) by mouth daily.  ? [EXPIRED] iron sucrose (VENOFER) injection 200 mg   ? ?No facility-administered encounter medications on file as of 12/22/2021.  ? ? ?Allergies (verified) ?Penicillins, Ace inhibitors, Aspirin, Celebrex [celecoxib], and Lipitor [atorvastatin]  ? ?History: ?Past Medical History:  ?  Diagnosis Date  ? Anemia   ? due to GIB, s/p transfusion  ? Anginal pain ([place]) 07/10/2018  ? Arthritis   ? back pain, much worse after consecutive golf rounds  ? Cancer [facility])   ? thyroid  ? Chronic kidney disease   ? Colon polyps   ? Coronary artery disease   ? COVID-19   ? Dyspnea   ? walking up a hill  ? Dysrhythmia 2020  ? GERD (gastroesophageal reflux disease)   ? Hypercalcemia   ? h/o, resolved as of 2012, prev due to high amount of calcium intake  ? Hyperlipidemia   ? Hypertension   ? Hypothyroidism   ? IgG gammopathy   ? stable as of 2012 per [facility]  ?  Pneumonia   ? as a child  ? PVD (peripheral vascular disease) ([place])   ? L leg bypass, R leg stented  ? ?Past Surgical History:  ?Procedure Laterality Date  ?  dental implant left bottom-did have bleeding     ? ABDOMINAL AORTOGRAM W/LOWER EXTREMITY Bilateral 06/26/2019  ? Procedure: ABDOMINAL AORTOGRAM W/LOWER EXTREMITY;  Surgeon: [name], MD;  Location: [place] [facility];  Service: Cardiovascular;  Laterality: Bilateral;  ? ABDOMINAL AORTOGRAM W/LOWER EXTREMITY N/A 06/08/2020  ? Procedure: ABDOMINAL AORTOGRAM W/LOWER EXTREMITY;  Surgeon: Serafina Mitchell, MD;  Location: [place] [facility];  Service: Cardiovascular;  Laterality: N/A;  ? BYPASS GRAFT    ? L leg  ? CARDIAC CATHETERIZATION  06/24/2018  ? COLONOSCOPY N/A 10/09/2021  ? Procedure: COLONOSCOPY;  Surgeon: Toledo, Benay Pike, MD;  Location: [facility] ENDOSCOPY;  Service: Gastroenterology;  Laterality: N/A;  ? COLONOSCOPY WITH PROPOFOL N/A 05/04/2015  ? Procedure: COLONOSCOPY WITH PROPOFOL;  Surgeon: Lollie Sails, MD;  Location: [facility] ENDOSCOPY;  Service: Endoscopy;  Laterality: N/A;  ? CORONARY ARTERY BYPASS GRAFT N/A 07/11/2018  ? Procedure: CORONARY ARTERY BYPASS GRAFTING (CABG) x three, using left internal mammary artery and right leg greater saphenous vein harvested endoscopically;  Surgeon: Gaye Pollack, MD;  Location: [place] OR;  Service: Open Heart Surgery;  Laterality: N/A;  ? ENDARTERECTOMY FEMORAL Right 02/13/2020  ? Procedure: RIGHT ENDARTERECTOMY FEMORAL WITH BOVINE PATCH;  Surgeon: Serafina Mitchell, MD;  Location: [facility] OR;  Service: Vascular;  Laterality: Right;  ? ESOPHAGOGASTRODUODENOSCOPY N/A 10/09/2021  ? Procedure: ESOPHAGOGASTRODUODENOSCOPY (EGD);  Surgeon: Toledo, Benay Pike, MD;  Location: [facility] ENDOSCOPY;  Service: Gastroenterology;  Laterality: N/A;  ? GIVENS CAPSULE STUDY N/A 10/09/2021  ? Procedure: GIVENS CAPSULE STUDY;  Surgeon: Toledo, Benay Pike, MD;  Location: [facility] ENDOSCOPY;  Service: Gastroenterology;  Laterality: N/A;  ? LEFT HEART  CATH AND CORONARY ANGIOGRAPHY N/A 06/24/2018  ? Procedure: LEFT HEART CATH AND CORONARY ANGIOGRAPHY;  Surgeon: [name], MD;  Location: [place] [facility];  Service: Cardiovascular;  Laterality: N/A;  ? LOWER EXTREMITY ANGIOGRAPHY Bilateral 10/25/2021  ? Procedure: Lower Extremity Angiography;  Surgeon: Serafina Mitchell, MD;  Location: [place] [facility];  Service: Cardiovascular;  Laterality: Bilateral;  ? PERIPHERAL VASCULAR ATHERECTOMY Right 06/08/2020  ? Procedure: PERIPHERAL VASCULAR ATHERECTOMY;  Surgeon: Serafina Mitchell, MD;  Location: [place] [facility];  Service: Cardiovascular;  Laterality: Right;  superficial femoral  ? PERIPHERAL VASCULAR BALLOON ANGIOPLASTY Right 06/08/2020  ? Procedure: PERIPHERAL VASCULAR BALLOON ANGIOPLASTY;  Surgeon: Serafina Mitchell, MD;  Location: [place] [facility];  Service: Cardiovascular;  Laterality: Right;  External iliac  ? PERIPHERAL VASCULAR INTERVENTION Right 10/25/2021  ? Procedure: PERIPHERAL VASCULAR INTERVENTION;  Surgeon: Trula Slade,  Butch Penny, MD;  Location: [place] [facility];  Service: Cardiovascular;  Laterality: Right;  SFA  ? RENAL ANGIOGRAPHY Bilateral 10/25/2021  ? Procedure: RENAL ANGIOGRAPHY;  Surgeon: Serafina Mitchell, MD;  Location: [place] [facility];  Service: Cardiovascular;  Laterality: Bilateral;  ? TEE WITHOUT CARDIOVERSION N/A 07/11/2018  ? Procedure: TRANSESOPHAGEAL ECHOCARDIOGRAM (TEE);  Surgeon: Gaye Pollack, MD;  Location: [place];  Service: Open Heart Surgery;  Laterality: N/A;  ? THYROIDECTOMY, PARTIAL  2016  ? VASCULAR SURGERY    ? ?Family History  ?Problem Relation Age of Onset  ? Diabetes Mother   ? Stroke Father   ? Colon cancer Neg Hx   ? Prostate cancer Neg Hx   ? ?Social History  ? ?Socioeconomic History  ? Marital status: Married  ?  Spouse name: Not on file  ? Number of children: Not on file  ? Years of education: Not on file  ? Highest education level: Not on file  ?Occupational History  ? Not on file  ?Tobacco Use  ? Smoking  status: Former  ?  Types: Cigarettes  ?  Quit date: 09/04/2008  ?  Years since quitting: 13.3  ? Smokeless tobacco: Former  ?  Types: Chew  ? Tobacco comments:  ?  HAs quit tobacco products 2009  ?Vaping Use  ? Vaping Use:

## 2021-12-22 NOTE — Patient Instructions (Signed)
Mr. [place] , ?Thank you for taking time to come for your Medicare Wellness Visit. I appreciate your ongoing commitment to your health goals. Please review the following plan we discussed and let me know if I can assist you in the future.  ? ?Screening recommendations/referrals: ?Colonoscopy: 10/09/21 ?Recommended yearly ophthalmology/optometry visit for glaucoma screening and checkup ?Recommended yearly dental visit for hygiene and checkup ? ?Vaccinations: ?Influenza vaccine: had this season at pharmacy ?Pneumococcal vaccine: 04/07/14 ?Tdap vaccine: 03/12/12 ?Shingles vaccine: Zostavax 06/07/10   ?Covid-19: 10/18/19, 11/15/19, 08/03/20 ? ?Advanced directives: no ? ?Conditions/risks identified: none ? ?Next appointment: Follow up in one year for your annual wellness visit. 12/25/22 @ 3:15pm by phone ? ?Preventive Care 83 Years and Older, Male ?Preventive care refers to lifestyle choices and visits with your health care provider that can promote health and wellness. ?What does preventive care include? ?A yearly physical exam. This is also called an annual well check. ?Dental exams once or twice a year. ?Routine eye exams. Ask your health care provider how often you should have your eyes checked. ?Personal lifestyle choices, including: ?Daily care of your teeth and gums. ?Regular physical activity. ?Eating a healthy diet. ?Avoiding tobacco and drug use. ?Limiting alcohol use. ?Practicing safe sex. ?Taking low doses of aspirin every day. ?Taking vitamin and mineral supplements as recommended by your health care provider. ?What happens during an annual well check? ?The services and screenings done by your health care provider during your annual well check will depend on your age, overall health, lifestyle risk factors, and family history of disease. ?Counseling  ?Your health care provider may ask you questions about your: ?Alcohol use. ?Tobacco use. ?Drug use. ?Emotional well-being. ?Home and relationship well-being. ?Sexual  activity. ?Eating habits. ?History of falls. ?Memory and ability to understand (cognition). ?Work and work Statistician. ?Screening  ?You may have the following tests or measurements: ?Height, weight, and BMI. ?Blood pressure. ?Lipid and cholesterol levels. These may be checked every 5 years, or more frequently if you are over 88 years old. ?Skin check. ?Lung cancer screening. You may have this screening every year starting at age 54 if you have a 30-pack-year history of smoking and currently smoke or have quit within the past 15 years. ?Fecal occult blood test (FOBT) of the stool. You may have this test every year starting at age 30. ?Flexible sigmoidoscopy or colonoscopy. You may have a sigmoidoscopy every 5 years or a colonoscopy every 10 years starting at age 4. ?Prostate cancer screening. Recommendations will vary depending on your family history and other risks. ?Hepatitis C blood test. ?Hepatitis B blood test. ?Sexually transmitted disease (STD) testing. ?Diabetes screening. This is done by checking your blood sugar (glucose) after you have not eaten for a while (fasting). You may have this done every 1-3 years. ?Abdominal aortic aneurysm (AAA) screening. You may need this if you are a current or former smoker. ?Osteoporosis. You may be screened starting at age 85 if you are at high risk. ?Talk with your health care provider about your test results, treatment options, and if necessary, the need for more tests. ?Vaccines  ?Your health care provider may recommend certain vaccines, such as: ?Influenza vaccine. This is recommended every year. ?Tetanus, diphtheria, and acellular pertussis (Tdap, Td) vaccine. You may need a Td booster every 10 years. ?Zoster vaccine. You may need this after age 41. ?Pneumococcal 13-valent conjugate (PCV13) vaccine. One dose is recommended after age 47. ?Pneumococcal polysaccharide (PPSV23) vaccine. One dose is recommended after age 97. ?Talk  to your health care provider about which  screenings and vaccines you need and how often you need them. ?This information is not intended to replace advice given to you by your health care provider. Make sure you discuss any questions you have with your health care provider. ?Document Released: 09/17/2015 Document Revised: 05/10/2016 Document Reviewed: 06/22/2015 ?Elsevier Interactive Patient Education ? 2017 [place]. ? ?Fall Prevention in the Home ?Falls can cause injuries. They can happen to people of all ages. There are many things you can do to make your home safe and to help prevent falls. ?What can I do on the outside of my home? ?Regularly fix the edges of walkways and driveways and fix any cracks. ?Remove anything that might make you trip as you walk through a door, such as a raised step or threshold. ?Trim any bushes or trees on the path to your home. ?Use bright outdoor lighting. ?Clear any walking paths of anything that might make someone trip, such as rocks or tools. ?Regularly check to see if handrails are loose or broken. Make sure that both sides of any steps have handrails. ?Any raised decks and porches should have guardrails on the edges. ?Have any leaves, snow, or ice cleared regularly. ?Use sand or salt on walking paths during winter. ?Clean up any spills in your garage right away. This includes oil or grease spills. ?What can I do in the bathroom? ?Use night lights. ?Install grab bars by the toilet and in the tub and shower. Do not use towel bars as grab bars. ?Use non-skid mats or decals in the tub or shower. ?If you need to sit down in the shower, use a plastic, non-slip stool. ?Keep the floor dry. Clean up any water that spills on the floor as soon as it happens. ?Remove soap buildup in the tub or shower regularly. ?Attach bath mats securely with double-sided non-slip rug tape. ?Do not have throw rugs and other things on the floor that can make you trip. ?What can I do in the bedroom? ?Use night lights. ?Make sure that you have a  light by your bed that is easy to reach. ?Do not use any sheets or blankets that are too big for your bed. They should not hang down onto the floor. ?Have a firm chair that has side arms. You can use this for support while you get dressed. ?Do not have throw rugs and other things on the floor that can make you trip. ?What can I do in the kitchen? ?Clean up any spills right away. ?Avoid walking on wet floors. ?Keep items that you use a lot in easy-to-reach places. ?If you need to reach something above you, use a strong step stool that has a grab bar. ?Keep electrical cords out of the way. ?Do not use floor polish or wax that makes floors slippery. If you must use wax, use non-skid floor wax. ?Do not have throw rugs and other things on the floor that can make you trip. ?What can I do with my stairs? ?Do not leave any items on the stairs. ?Make sure that there are handrails on both sides of the stairs and use them. Fix handrails that are broken or loose. Make sure that handrails are as long as the stairways. ?Check any carpeting to make sure that it is firmly attached to the stairs. Fix any carpet that is loose or worn. ?Avoid having throw rugs at the top or bottom of the stairs. If you do have throw rugs,  attach them to the floor with carpet tape. ?Make sure that you have a light switch at the top of the stairs and the bottom of the stairs. If you do not have them, ask someone to add them for you. ?What else can I do to help prevent falls? ?Wear shoes that: ?Do not have high heels. ?Have rubber bottoms. ?Are comfortable and fit you well. ?Are closed at the toe. Do not wear sandals. ?If you use a stepladder: ?Make sure that it is fully opened. Do not climb a closed stepladder. ?Make sure that both sides of the stepladder are locked into place. ?Ask someone to hold it for you, if possible. ?Clearly mark and make sure that you can see: ?Any grab bars or handrails. ?First and last steps. ?Where the edge of each step  is. ?Use tools that help you move around (mobility aids) if they are needed. These include: ?Canes. ?Walkers. ?Scooters. ?Crutches. ?Turn on the lights when you go into a dark area. Replace any light bulbs as

## 2021-12-22 NOTE — Patient Instructions (Signed)
[facility]  Discharge Instructions: ?Thank you for choosing [facility] to provide your oncology and hematology care.  ?If you have a lab appointment with the Cancer Center, please go directly to the Cancer Center and check in at the registration area. ? ?Wear comfortable clothing and clothing appropriate for easy access to any Portacath or PICC line.  ? ?We strive to give you quality time with your provider. You may need to reschedule your appointment if you arrive late (15 or more minutes).  Arriving late affects you and other patients whose appointments are after yours.  Also, if you miss three or more appointments without notifying the office, you may be dismissed from the clinic at the provider?s discretion.    ?  ?For prescription refill requests, have your pharmacy contact our office and allow 72 hours for refills to be completed.   ? ?Today you received the following chemotherapy and/or immunotherapy agents VENOFER ?    ?  ?To help prevent nausea and vomiting after your treatment, we encourage you to take your nausea medication as directed. ? ?BELOW ARE SYMPTOMS THAT SHOULD BE REPORTED IMMEDIATELY: ?*FEVER GREATER THAN 100.4 F (38 ?C) OR HIGHER ?*CHILLS OR SWEATING ?*NAUSEA AND VOMITING THAT IS NOT CONTROLLED WITH YOUR NAUSEA MEDICATION ?*UNUSUAL SHORTNESS OF BREATH ?*UNUSUAL BRUISING OR BLEEDING ?*URINARY PROBLEMS (pain or burning when urinating, or frequent urination) ?*BOWEL PROBLEMS (unusual diarrhea, constipation, pain near the anus) ?TENDERNESS IN MOUTH AND THROAT WITH OR WITHOUT PRESENCE OF ULCERS (sore throat, sores in mouth, or a toothache) ?UNUSUAL RASH, SWELLING OR PAIN  ?UNUSUAL VAGINAL DISCHARGE OR ITCHING  ? ?Items with * indicate a potential emergency and should be followed up as soon as possible or go to the Emergency Department if any problems should occur. ? ?Please show the CHEMOTHERAPY ALERT CARD or IMMUNOTHERAPY ALERT CARD at check-in to  the Emergency Department and triage nurse. ? ?Should you have questions after your visit or need to cancel or reschedule your appointment, please contact [facility]  [phone] and follow the prompts.  Office hours are 8:00 a.m. to 4:30 p.m. Monday - Friday. Please note that voicemails left after 4:00 p.m. may not be returned until the following business day.  We are closed weekends and major holidays. You have access to a nurse at all times for urgent questions. Please call the main number to the clinic [phone] and follow the prompts. ? ?For any non-urgent questions, you may also contact your provider using MyChart. We now offer e-Visits for anyone 18 and older to request care online for non-urgent symptoms. For details visit [email or web address]. ?  ?Also download the MyChart app! Go to the app store, search "MyChart", open the app, select [place], and log in with your MyChart username and password. ? ?Due to Covid, a mask is required upon entering the hospital/clinic. If you do not have a mask, one will be given to you upon arrival. For doctor visits, patients may have 1 support person aged 18 or older with them. For treatment visits, patients cannot have anyone with them due to current Covid guidelines and our immunocompromised population.  ? ?Iron Sucrose Injection ?What is this medication? ?IRON SUCROSE (EYE ern SOO krose) treats low levels of iron (iron deficiency anemia) in people with kidney disease. Iron is a mineral that plays an important role in making red blood cells, which carry oxygen from your lungs to the rest of your body. ?This medicine   may be used for other purposes; ask your health care provider or pharmacist if you have questions. ?COMMON BRAND NAME(S): Venofer ?What should I tell my care team before I take this medication? ?They need to know if you have any of these conditions: ?Anemia not caused by low iron levels ?Heart disease ?High levels of  iron in the blood ?Kidney disease ?Liver disease ?An unusual or allergic reaction to iron, other medications, foods, dyes, or preservatives ?Pregnant or trying to get pregnant ?Breast-feeding ?How should I use this medication? ?This medication is for infusion into a vein. It is given in a hospital or clinic setting. ?Talk to your care team about the use of this medication in children. While this medication may be prescribed for children as young as 2 years for selected conditions, precautions do apply. ?Overdosage: If you think you have taken too much of this medicine contact a poison control center or emergency room at once. ?NOTE: This medicine is only for you. Do not share this medicine with others. ?What if I miss a dose? ?It is important not to miss your dose. Call your care team if you are unable to keep an appointment. ?What may interact with this medication? ?Do not take this medication with any of the following: ?Deferoxamine ?Dimercaprol ?Other iron products ?This medication may also interact with the following: ?Chloramphenicol ?Deferasirox ?This list may not describe all possible interactions. Give your health care provider a list of all the medicines, herbs, non-prescription drugs, or dietary supplements you use. Also tell them if you smoke, drink alcohol, or use illegal drugs. Some items may interact with your medicine. ?What should I watch for while using this medication? ?Visit your care team regularly. Tell your care team if your symptoms do not start to get better or if they get worse. You may need blood work done while you are taking this medication. ?You may need to follow a special diet. Talk to your care team. Foods that contain iron include: whole grains/cereals, dried fruits, beans, or peas, leafy green vegetables, and organ meats (liver, kidney). ?What side effects may I notice from receiving this medication? ?Side effects that you should report to your care team as soon as  possible: ?Allergic reactions--skin rash, itching, hives, swelling of the face, lips, tongue, or throat ?Low blood pressure--dizziness, feeling faint or lightheaded, blurry vision ?Shortness of breath ?Side effects that usually do not require medical attention (report to your care team if they continue or are bothersome): ?Flushing ?Headache ?Joint pain ?Muscle pain ?Nausea ?Pain, redness, or irritation at injection site ?This list may not describe all possible side effects. Call your doctor for medical advice about side effects. You may report side effects to FDA at 1-800-FDA-1088. ?Where should I keep my medication? ?This medication is given in a hospital or clinic and will not be stored at home. ?NOTE: This sheet is a summary. It may not cover all possible information. If you have questions about this medicine, talk to your doctor, pharmacist, or health care provider. ?? 2023 Elsevier/Gold Standard (2021-01-14 00:00:00) ? ?

## 2021-12-23 ENCOUNTER — Ambulatory Visit: Payer: Medicare Other | Admitting: Physician Assistant

## 2021-12-23 ENCOUNTER — Ambulatory Visit: Payer: Medicare Other | Admitting: Adult Health

## 2021-12-27 LAB — IFE+PROTEIN ELECTRO, 24-HR UR
% BETA, Urine: 0 %
ALPHA 1 URINE: 0 %
Albumin, U: 100 %
Alpha 2, Urine: 0 %
GAMMA GLOBULIN URINE: 0 %
Total Protein, Urine-Ur/day: 208 mg/24 hr — ABNORMAL HIGH (ref 30–150)
Total Protein, Urine: 7.7 mg/dL
Total Volume: 2700

## 2021-12-29 ENCOUNTER — Inpatient Hospital Stay (HOSPITAL_BASED_OUTPATIENT_CLINIC_OR_DEPARTMENT_OTHER): Payer: Medicare Other | Admitting: Oncology

## 2021-12-29 ENCOUNTER — Inpatient Hospital Stay: Payer: Medicare Other

## 2021-12-29 ENCOUNTER — Encounter: Payer: Self-pay | Admitting: Oncology

## 2021-12-29 VITALS — BP 156/71 | HR 64

## 2021-12-29 VITALS — BP 138/46 | HR 66 | Temp 97.6°F | Resp 16 | Wt 157.0 lb

## 2021-12-29 DIAGNOSIS — D62 Acute posthemorrhagic anemia: Secondary | ICD-10-CM

## 2021-12-29 DIAGNOSIS — D500 Iron deficiency anemia secondary to blood loss (chronic): Secondary | ICD-10-CM | POA: Diagnosis not present

## 2021-12-29 DIAGNOSIS — D649 Anemia, unspecified: Secondary | ICD-10-CM | POA: Diagnosis not present

## 2021-12-29 DIAGNOSIS — D472 Monoclonal gammopathy: Secondary | ICD-10-CM | POA: Diagnosis not present

## 2021-12-29 DIAGNOSIS — D72829 Elevated white blood cell count, unspecified: Secondary | ICD-10-CM | POA: Diagnosis not present

## 2021-12-29 DIAGNOSIS — I4891 Unspecified atrial fibrillation: Secondary | ICD-10-CM | POA: Diagnosis not present

## 2021-12-29 DIAGNOSIS — D509 Iron deficiency anemia, unspecified: Secondary | ICD-10-CM | POA: Diagnosis not present

## 2021-12-29 DIAGNOSIS — I739 Peripheral vascular disease, unspecified: Secondary | ICD-10-CM | POA: Diagnosis not present

## 2021-12-29 MED ORDER — IRON SUCROSE 20 MG/ML IV SOLN
200.0000 mg | Freq: Once | INTRAVENOUS | Status: AC
  Administered 2021-12-29: 200 mg via INTRAVENOUS
  Filled 2021-12-29: qty 10

## 2021-12-29 MED ORDER — SODIUM CHLORIDE 0.9 % IV SOLN
Freq: Once | INTRAVENOUS | Status: AC
  Filled 2021-12-29: qty 250

## 2021-12-29 MED ORDER — SODIUM CHLORIDE 0.9 % IV SOLN: 200.0000 mg | Freq: Once | INTRAVENOUS | Status: DC

## 2021-12-29 NOTE — Progress Notes (Signed)
Pt states no new concerns for today. ?

## 2021-12-29 NOTE — Progress Notes (Signed)
?Hematology/Oncology Consult note ?[phone] [phone] ?  ? ?   ? ? ?Patient Care Team: ?Tonia Ghent, MD as PCP - General (Family Medicine) ?[name], MD as PCP - Cardiology (Cardiology) ?Lorelee Cover., MD as Consulting Physician (Ophthalmology) ?Scott C. Stoiff, MD as Consulting Physician (Urology) ?Macario Carls, MS, CCC-A as Set designer (Audiology) ?Deberah Pelton, NP as Nurse Practitioner (Cardiology) ?Foltanski, Cleaster Corin, [facility] as Pharmacist (Pharmacist) ?Alice Reichert, Benay Pike, MD as Consulting Physician (Gastroenterology) ?Earlie Server, MD as Consulting Physician (Oncology) ?Ottie Glazier, MD as Consulting Physician (Pulmonary Disease) ? ?REFERRING PROVIDER: ?Tonia Ghent, MD  ?CHIEF COMPLAINTS/REASON FOR VISIT:  ?Evaluation of anemia ? ?HISTORY OF PRESENTING ILLNESS:  ? ?Ryan Pittman is a  81 y.o.  male with PMH listed below was seen in consultation at the request of  Tonia Ghent, MD  for evaluation of anemia ? ?Patient reports feeling fatigued and tired. ?11/22/2021, ED evaluation for low hemoglobin. + History of melena.  Patient has had extensive GI work-up including upper endoscopy, colonoscopy and capsule study in February.  No active bleeding source was discovered.  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  Patient was advised to take PPI.  Patient was on Eliquis for peripheral vascular disease, atrial fibrillation.  Eliquis has been stopped since mid March 2023. ?Patient reports that currently his stool color is brown.  Fatigue level is slightly better. ?Denies unintentional weight loss, fever, night sweats.  ? ?12/02/2021, recent CBC showed hemoglobin 8.9, MCV 88.3. ?Patient has had chronic leukocytosis with white count of 11.3 on 11/25/2021.  Denies any steroid use. ? ? ?INTERVAL HISTORY ?Ryan Pittman is a 81 y.o. male who has above history reviewed by me today presents for follow up visit for deficiency anemia, MGUS. ?Status  post  IV Venofer x1   Patient feels the fatigue significantly improved ?[name] ?Patient has a history of IgG Kappa MGUS. 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 ?12/21/2021, bone marrow biopsy showed hypercellular marrow with trilineage hematopoiesis.  CD138 highlights plasma cells 1% which are polytypic by kappa and lambda in situ hybridization.  Iron stores absent.  Cytogenetics are pending. ? ? ?Review of Systems  ?Constitutional:  Positive for fatigue. Negative for appetite change, chills, fever and unexpected weight change.  ?HENT:   Negative for hearing loss and voice change.   ?Eyes:  Negative for eye problems and icterus.  ?Respiratory:  Negative for chest tightness and cough.   ?Cardiovascular:  Negative for chest pain and leg swelling.  ?Gastrointestinal:  Negative for abdominal distention and abdominal pain.  ?Endocrine: Negative for hot flashes.  ?Genitourinary:  Negative for difficulty urinating, dysuria and frequency.   ?Musculoskeletal:  Negative for arthralgias.  ?Skin:  Negative for itching and rash.  ?Neurological:  Negative for light-headedness and numbness.  ?Hematological:  Negative for adenopathy. Does not bruise/bleed easily.  ?Psychiatric/Behavioral:  Negative for confusion.   ? ?MEDICAL HISTORY:  ?Past Medical History:  ?Diagnosis Date  ? Anemia   ? due to GIB, s/p transfusion  ? Anginal pain ([place]) 07/10/2018  ? Arthritis   ? back pain, much worse after consecutive golf rounds  ? Cancer [facility])   ? thyroid  ? Chronic kidney disease   ? Colon polyps   ? Coronary artery disease   ? COVID-19   ? Dyspnea   ? walking up a hill  ? Dysrhythmia 2020  ? GERD (  gastroesophageal reflux disease)   ? Hypercalcemia   ? h/o, resolved as of 2012, prev due to high amount of calcium intake  ? Hyperlipidemia   ? Hypertension   ? Hypothyroidism   ? IgG gammopathy   ? stable as of 2012 per [facility]  ? Pneumonia   ? as a child  ? PVD (peripheral vascular  disease) ([place])   ? L leg bypass, R leg stented  ? ? ?SURGICAL HISTORY: ?Past Surgical History:  ?Procedure Laterality Date  ?  dental implant left bottom-did have bleeding     ? ABDOMINAL AORTOGRAM W/LOWER EXTREMITY Bilateral 06/26/2019  ? Procedure: ABDOMINAL AORTOGRAM W/LOWER EXTREMITY;  Surgeon: [name], MD;  Location: [place] [facility];  Service: Cardiovascular;  Laterality: Bilateral;  ? ABDOMINAL AORTOGRAM W/LOWER EXTREMITY N/A 06/08/2020  ? Procedure: ABDOMINAL AORTOGRAM W/LOWER EXTREMITY;  Surgeon: Serafina Mitchell, MD;  Location: [place] [facility];  Service: Cardiovascular;  Laterality: N/A;  ? BYPASS GRAFT    ? L leg  ? CARDIAC CATHETERIZATION  06/24/2018  ? COLONOSCOPY N/A 10/09/2021  ? Procedure: COLONOSCOPY;  Surgeon: Toledo, Benay Pike, MD;  Location: [facility] ENDOSCOPY;  Service: Gastroenterology;  Laterality: N/A;  ? COLONOSCOPY WITH PROPOFOL N/A 05/04/2015  ? Procedure: COLONOSCOPY WITH PROPOFOL;  Surgeon: Lollie Sails, MD;  Location: [facility] ENDOSCOPY;  Service: Endoscopy;  Laterality: N/A;  ? CORONARY ARTERY BYPASS GRAFT N/A 07/11/2018  ? Procedure: CORONARY ARTERY BYPASS GRAFTING (CABG) x three, using left internal mammary artery and right leg greater saphenous vein harvested endoscopically;  Surgeon: Gaye Pollack, MD;  Location: [place] OR;  Service: Open Heart Surgery;  Laterality: N/A;  ? ENDARTERECTOMY FEMORAL Right 02/13/2020  ? Procedure: RIGHT ENDARTERECTOMY FEMORAL WITH BOVINE PATCH;  Surgeon: Serafina Mitchell, MD;  Location[facility] OR;  Service: Vascular;  Laterality: Right;  ? ESOPHAGOGASTRODUODENOSCOPY N/A 10/09/2021  ? Procedure: ESOPHAGOGASTRODUODENOSCOPY (EGD);  Surgeon: Toledo, Benay Pike, MD;  Location: [facility] ENDOSCOPY;  Service: Gastroenterology;  Laterality: N/A;  ? GIVENS CAPSULE STUDY N/A 10/09/2021  ? Procedure: GIVENS CAPSULE STUDY;  Surgeon: Toledo, Benay Pike, MD;  Location: [facility] ENDOSCOPY;  Service: Gastroenterology;  Laterality: N/A;  ? LEFT HEART CATH AND CORONARY ANGIOGRAPHY N/A  06/24/2018  ? Procedure: LEFT HEART CATH AND CORONARY ANGIOGRAPHY;  Surgeon: [name], MD;  Location: [place] [facility];  Service: Cardiovascular;  Laterality: N/A;  ? LOWER EXTREMITY ANGIOGRAPHY Bilateral 10/25/2021  ? Procedure: Lower Extremity Angiography;  Surgeon: Serafina Mitchell, MD;  Location: [place] [facility];  Service: Cardiovascular;  Laterality: Bilateral;  ? PERIPHERAL VASCULAR ATHERECTOMY Right 06/08/2020  ? Procedure: PERIPHERAL VASCULAR ATHERECTOMY;  Surgeon: Serafina Mitchell, MD;  Location: [place] [facility];  Service: Cardiovascular;  Laterality: Right;  superficial femoral  ? PERIPHERAL VASCULAR BALLOON ANGIOPLASTY Right 06/08/2020  ? Procedure: PERIPHERAL VASCULAR BALLOON ANGIOPLASTY;  Surgeon: Serafina Mitchell, MD;  Location: [place] [facility];  Service: Cardiovascular;  Laterality: Right;  External iliac  ? PERIPHERAL VASCULAR INTERVENTION Right 10/25/2021  ? Procedure: PERIPHERAL VASCULAR INTERVENTION;  Surgeon: Serafina Mitchell, MD;  Location: [place] [facility];  Service: Cardiovascular;  Laterality: Right;  SFA  ? RENAL ANGIOGRAPHY Bilateral 10/25/2021  ? Procedure: RENAL ANGIOGRAPHY;  Surgeon: Serafina Mitchell, MD;  Location: [place] [facility];  Service: Cardiovascular;  Laterality: Bilateral;  ? TEE WITHOUT CARDIOVERSION N/A 07/11/2018  ? Procedure: TRANSESOPHAGEAL ECHOCARDIOGRAM (TEE);  Surgeon: Gaye Pollack, MD;  Location: [place];  Service: Open Heart Surgery;  Laterality:  N/A;  ? THYROIDECTOMY, PARTIAL  2016  ? VASCULAR SURGERY    ? ? ?SOCIAL HISTORY: ?Social History  ? ?Socioeconomic History  ? Marital status: Married  ?  Spouse name: Not on file  ? Number of children: Not on file  ? Years of education: Not on file  ? Highest education level: Not on file  ?Occupational History  ? Not on file  ?Tobacco Use  ? Smoking status: Former  ?  Types: Cigarettes  ?  Quit date: 09/04/2008  ?  Years since quitting: 13.3  ? Smokeless tobacco: Former  ?  Types: Chew  ? Tobacco comments:  ?   HAs quit tobacco products 2009  ?Vaping Use  ? Vaping Use: Never used  ?Substance and Sexual Activity  ? Alcohol use: Yes  ?  Comment: occ  ? Drug use: No  ? Sexual activity: Yes  ?Other Topics Concern

## 2022-01-02 ENCOUNTER — Encounter (HOSPITAL_COMMUNITY): Payer: Self-pay | Admitting: Oncology

## 2022-01-03 DIAGNOSIS — R809 Proteinuria, unspecified: Secondary | ICD-10-CM | POA: Diagnosis not present

## 2022-01-03 DIAGNOSIS — I10 Essential (primary) hypertension: Secondary | ICD-10-CM | POA: Diagnosis not present

## 2022-01-03 DIAGNOSIS — D631 Anemia in chronic kidney disease: Secondary | ICD-10-CM | POA: Diagnosis not present

## 2022-01-03 DIAGNOSIS — N1831 Chronic kidney disease, stage 3a: Secondary | ICD-10-CM | POA: Diagnosis not present

## 2022-01-04 LAB — SURGICAL PATHOLOGY

## 2022-01-05 ENCOUNTER — Inpatient Hospital Stay: Payer: Medicare Other | Attending: Oncology

## 2022-01-05 VITALS — BP 130/46 | HR 60 | Temp 96.4°F | Resp 20

## 2022-01-05 DIAGNOSIS — D62 Acute posthemorrhagic anemia: Secondary | ICD-10-CM

## 2022-01-05 DIAGNOSIS — D509 Iron deficiency anemia, unspecified: Secondary | ICD-10-CM | POA: Diagnosis not present

## 2022-01-05 MED ORDER — SODIUM CHLORIDE 0.9 % IV SOLN
Freq: Once | INTRAVENOUS | Status: AC
  Filled 2022-01-05: qty 250

## 2022-01-05 MED ORDER — IRON SUCROSE 20 MG/ML IV SOLN
200.0000 mg | Freq: Once | INTRAVENOUS | Status: AC
  Administered 2022-01-05: 200 mg via INTRAVENOUS
  Filled 2022-01-05: qty 10

## 2022-01-05 MED ORDER — SODIUM CHLORIDE 0.9 % IV SOLN: 200.0000 mg | Freq: Once | INTRAVENOUS | Status: DC

## 2022-01-05 NOTE — Patient Instructions (Signed)

## 2022-01-10 DIAGNOSIS — D472 Monoclonal gammopathy: Secondary | ICD-10-CM | POA: Diagnosis not present

## 2022-01-12 ENCOUNTER — Inpatient Hospital Stay: Payer: Medicare Other

## 2022-01-12 VITALS — BP 151/46 | HR 60 | Temp 97.9°F | Resp 18

## 2022-01-12 DIAGNOSIS — D62 Acute posthemorrhagic anemia: Secondary | ICD-10-CM

## 2022-01-12 DIAGNOSIS — D509 Iron deficiency anemia, unspecified: Secondary | ICD-10-CM | POA: Diagnosis not present

## 2022-01-12 MED ORDER — SODIUM CHLORIDE 0.9 % IV SOLN
Freq: Once | INTRAVENOUS | Status: AC
  Filled 2022-01-12: qty 250

## 2022-01-12 MED ORDER — SODIUM CHLORIDE 0.9 % IV SOLN: 200.0000 mg | Freq: Once | INTRAVENOUS | Status: DC

## 2022-01-12 MED ORDER — IRON SUCROSE 20 MG/ML IV SOLN
200.0000 mg | Freq: Once | INTRAVENOUS | Status: AC
  Administered 2022-01-12: 200 mg via INTRAVENOUS
  Filled 2022-01-12: qty 10

## 2022-01-12 NOTE — Progress Notes (Signed)
Pt tolerated venofer infusion well today.  Pt left infusion suite stable and ambulatory.  ?

## 2022-01-12 NOTE — Patient Instructions (Signed)

## 2022-01-25 ENCOUNTER — Ambulatory Visit (INDEPENDENT_AMBULATORY_CARE_PROVIDER_SITE_OTHER): Payer: Medicare Other | Admitting: Physician Assistant

## 2022-01-25 ENCOUNTER — Encounter: Payer: Self-pay | Admitting: Physician Assistant

## 2022-01-25 VITALS — BP 135/60 | HR 72 | Ht 67.0 in | Wt 157.2 lb

## 2022-01-25 DIAGNOSIS — I25810 Atherosclerosis of coronary artery bypass graft(s) without angina pectoris: Secondary | ICD-10-CM | POA: Diagnosis not present

## 2022-01-25 DIAGNOSIS — D649 Anemia, unspecified: Secondary | ICD-10-CM

## 2022-01-25 DIAGNOSIS — E782 Mixed hyperlipidemia: Secondary | ICD-10-CM | POA: Diagnosis not present

## 2022-01-25 DIAGNOSIS — I10 Essential (primary) hypertension: Secondary | ICD-10-CM

## 2022-01-25 DIAGNOSIS — I739 Peripheral vascular disease, unspecified: Secondary | ICD-10-CM

## 2022-01-25 DIAGNOSIS — I701 Atherosclerosis of renal artery: Secondary | ICD-10-CM

## 2022-01-25 DIAGNOSIS — I480 Paroxysmal atrial fibrillation: Secondary | ICD-10-CM | POA: Diagnosis not present

## 2022-01-25 MED ORDER — APIXABAN 2.5 MG PO TABS
2.5000 mg | ORAL_TABLET | Freq: Two times a day (BID) | ORAL | 3 refills | Status: DC
Start: 2022-01-25 — End: 2022-05-31

## 2022-01-25 NOTE — Patient Instructions (Signed)
Medication Instructions:  RESTART Eliquis 2.5 mg 2 times a day  *If you need a refill on your cardiac medications before your next appointment, please call your pharmacy*  Lab Work: Your physician recommends that you return for lab work TODAY:  CBC  Your physician recommends that you return for lab work in 3-4 weeks:  CBC  If you have labs (blood work) drawn today and your tests are completely normal, you will receive your results only by: [employer] (if you have MyChart) OR A paper copy in the mail If you have any lab test that is abnormal or we need to change your treatment, we will call you to review the results.  Testing/Procedures: NONE ordered at this time of appointment   Follow-Up: At [facility], you and your health needs are our priority.  As part of our continuing mission to provide you with exceptional heart care, we have created designated Provider Care Teams.  These Care Teams include your primary Cardiologist (physician) and Advanced Practice Providers (APPs -  Physician Assistants and Nurse Practitioners) who all work together to provide you with the care you need, when you need it.    Your next appointment:   As previously scheduled    The format for your next appointment:   In Person  Provider:   Ida Rogue, MD    Other Instructions   Important Information About Sugar

## 2022-01-25 NOTE — Progress Notes (Signed)
Cardiology Office Note:    Date:  01/27/2022   ID:  OVID WITMAN, DOB 1941-02-22, MRN [id number]  PCP:  Tonia Ghent, MD   [facility] Providers Cardiologist:  [name], MD Cardiology APP:  Deberah Pelton, NP     Referring MD: Tonia Ghent, MD   Chief Complaint  Patient presents with   Follow-up    Seen for Dr. Gwenlyn Found    History of Present Illness:    LEVEON PELZER is a 81 y.o. male with a hx of CAD s/p CABG, hypertension, hyperlipidemia, hypothyroidism, IgG gammopathy and history of PAD.  He has a history of tobacco abuse.  He has never had a heart attack or stroke.  He did have a history of left femoral-popliteal bypass graft by Dr. Lucky Cowboy in [place] several years ago.  Patient had a positive stress test and a subsequent coronary CTA on 05/24/2018 that revealed elevated calcium score of 2858 with three-vessel calcification.  He ultimately underwent cardiac catheterization shortly after that revealed three-vessel disease with preserved EF.  He underwent CABG x5 by Dr. Cyndia Bent.  He was placed on Repatha for his lipid profile.  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  He ultimately underwent right femoral enterectomy and patch angioplasty by Dr. Trula Slade.  He was later diagnosed with atrial fibrillation with RVR when he presented to [facility] with chest pain.  He was placed on Eliquis therapy.  Patient was last seen by Dr. Gwenlyn Found in January 2023 at which time he was doing well.  He debated whether or not to leave the patient on anticoagulation therapy, he eventually recommended he continue on the Eliquis.  2 weeks heart monitor obtained in January 2023 showed sinus rhythm with rare short  episode of SVT, occasional PACs and PVCs but no obvious atrial fibrillation.    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  It was felt nonbleeding vascular malformation could be a source of GI bleed.  He also had right SFA stenting by Dr. Trula Slade on 10/25/2021.  Due to GI bleeding, his Plavix was discontinued and aspirin added.  Patient ultimately underwent Myoview on 11/18/2021 that showed EF 62%, no evidence of infarction or ischemia, overall low risk study.  Renal artery Doppler obtained in January 2023 demonstrated greater than 60% stenosis in the left renal artery.  Repeat Doppler recommended in 12 months.  Patient did notice to have a 70 to 99% stenosis in the SMA.  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  Dr. Gwenlyn Found initially recommended MRI, however after discussing the case with the patient's PCP, patient apparently had the same cystic structure on the previous imaging studies , therefore it was recommended for 30-month repeat ultrasound instead.  Patient underwent overlapping stents of right SFA by Dr. Trula Slade on 10/25/2021.  No significant renal artery stenosis was identified at that time.  Due to progressive anemia, Eliquis was temporarily taken off in March 2023.  He is currently being evaluated by hematology and oncology service, who felt iron deficiency is likely related to GI bleed.  He previously  has underwent multiple GI evaluation earlier this year.  Multiple myeloma panel was somewhat abnormal, he is due for a bone marrow biopsy to review the result.   I last saw the patient on 12/13/2021 at which time he was still off of Eliquis.  Given the significant anemia recently, I decided to hold off on restarting Eliquis.  I increase his metoprolol to 25 mg twice a day.  I started him on a  course of Protonix for GI protection.  Since the last visit, patient underwent a bone marrow biopsy on 12/21/2021, this revealed hypercellular bone marrow with trilineage hematopoiesis.  Bone marrow did not show significant increase of plasma cell.  He has since been seen by [facility] oncology service on 01/10/2022.  Patient presents today for follow-up.  He denies any chest pain, he is fatigue has improved since hemoglobin also improved.  On 4/19, hemoglobin was 8.7, in early May, blood work obtained at [facility] showed hemoglobin has improved to 11.6.  I talked to the patient about rechallenge him with Eliquis again, however he is quite hesitant.  I spoke with Dr. Gwenlyn Found who also discussed with the patient, we eventually decided to start at a lower dose of 2.5 mg twice a day of Eliquis, although technically patient would qualify for 5 mg dosing based on his age, body weight and renal function.  Will defer to vascular surgery to consider restarting aspirin given the recent lower extremity stenting.  I plan to obtain a CBC in 1 week and a repeat another CBC in 3-4 weeks for now.  He has a follow-up to establish with Dr. Rockey Situ in early June.   Past Medical History:  Diagnosis Date   Anemia    due to GIB, s/p transfusion   Anginal pain ([place]) 07/10/2018   Arthritis    back pain, much worse after consecutive golf rounds   Cancer [facility])    thyroid   Chronic kidney disease    Colon polyps    Coronary artery disease    COVID-19    Dyspnea    walking up a hill   Dysrhythmia 2020   GERD (gastroesophageal reflux disease)    Hypercalcemia    h/o, resolved as of 2012, prev due to high amount of calcium intake   Hyperlipidemia    Hypertension    Hypothyroidism    IgG gammopathy    stable as of 2012 per [facility]   Pneumonia    as a child   PVD (peripheral vascular disease) ([place])    L leg bypass, R leg stented    Past Surgical History:  Procedure Laterality Date    dental implant left bottom-did have bleeding       ABDOMINAL AORTOGRAM W/LOWER EXTREMITY Bilateral 06/26/2019   Procedure: ABDOMINAL AORTOGRAM W/LOWER EXTREMITY;  Surgeon: [name], MD;  Location: [place] [facility];  Service: Cardiovascular;  Laterality: Bilateral;   ABDOMINAL AORTOGRAM W/LOWER EXTREMITY N/A 06/08/2020   Procedure: ABDOMINAL AORTOGRAM W/LOWER EXTREMITY;  Surgeon: Serafina Mitchell, MD;  Location: [place] [facility];  Service: Cardiovascular;  Laterality: N/A;   BYPASS GRAFT     L leg   CARDIAC CATHETERIZATION  06/24/2018   COLONOSCOPY N/A 10/09/2021   Procedure: COLONOSCOPY;  Surgeon: Toledo, Benay Pike, MD;  Location: [facility] ENDOSCOPY;  Service: Gastroenterology;  Laterality: N/A;   COLONOSCOPY WITH PROPOFOL N/A 05/04/2015   Procedure: COLONOSCOPY WITH PROPOFOL;  Surgeon: Lollie Sails, MD;  Location: [facility] ENDOSCOPY;  Service: Endoscopy;  Laterality: N/A;  CORONARY ARTERY BYPASS GRAFT N/A 07/11/2018   Procedure: CORONARY ARTERY BYPASS GRAFTING (CABG) x three, using left internal mammary artery and right leg greater saphenous vein harvested endoscopically;  Surgeon: Gaye Pollack, MD;  Location: [place] OR;  Service: Open Heart Surgery;  Laterality: N/A;   ENDARTERECTOMY FEMORAL Right 02/13/2020   Procedure: RIGHT ENDARTERECTOMY FEMORAL WITH BOVINE PATCH;  Surgeon: Serafina Mitchell, MD;  Location: [facility] OR;  Service: Vascular;  Laterality: Right;   ESOPHAGOGASTRODUODENOSCOPY N/A 10/09/2021   Procedure: ESOPHAGOGASTRODUODENOSCOPY (EGD);  Surgeon: Toledo, Benay Pike, MD;  Location: [facility] ENDOSCOPY;  Service: Gastroenterology;  Laterality: N/A;   GIVENS CAPSULE STUDY N/A 10/09/2021   Procedure: GIVENS CAPSULE STUDY;  Surgeon: Toledo, Benay Pike, MD;  Location: [facility] ENDOSCOPY;  Service: Gastroenterology;  Laterality: N/A;   LEFT HEART CATH AND CORONARY ANGIOGRAPHY N/A 06/24/2018   Procedure: LEFT HEART CATH AND CORONARY ANGIOGRAPHY;  Surgeon: [name], MD;  Location: [place] [facility];  Service: Cardiovascular;  Laterality: N/A;    LOWER EXTREMITY ANGIOGRAPHY Bilateral 10/25/2021   Procedure: Lower Extremity Angiography;  Surgeon: Serafina Mitchell, MD;  Location: [place] [facility];  Service: Cardiovascular;  Laterality: Bilateral;   PERIPHERAL VASCULAR ATHERECTOMY Right 06/08/2020   Procedure: PERIPHERAL VASCULAR ATHERECTOMY;  Surgeon: Serafina Mitchell, MD;  Location: [place] [facility];  Service: Cardiovascular;  Laterality: Right;  superficial femoral   PERIPHERAL VASCULAR BALLOON ANGIOPLASTY Right 06/08/2020   Procedure: PERIPHERAL VASCULAR BALLOON ANGIOPLASTY;  Surgeon: Serafina Mitchell, MD;  Location: [place] [facility];  Service: Cardiovascular;  Laterality: Right;  External iliac   PERIPHERAL VASCULAR INTERVENTION Right 10/25/2021   Procedure: PERIPHERAL VASCULAR INTERVENTION;  Surgeon: Serafina Mitchell, MD;  Location: [place] [facility];  Service: Cardiovascular;  Laterality: Right;  SFA   RENAL ANGIOGRAPHY Bilateral 10/25/2021   Procedure: RENAL ANGIOGRAPHY;  Surgeon: Serafina Mitchell, MD;  Location: [place] [facility];  Service: Cardiovascular;  Laterality: Bilateral;   TEE WITHOUT CARDIOVERSION N/A 07/11/2018   Procedure: TRANSESOPHAGEAL ECHOCARDIOGRAM (TEE);  Surgeon: Gaye Pollack, MD;  Location: [place];  Service: Open Heart Surgery;  Laterality: N/A;   THYROIDECTOMY, PARTIAL  2016   VASCULAR SURGERY      Current Medications: Current Meds  Medication Sig   acetaminophen (TYLENOL) 500 MG tablet Take 1,000 mg by mouth every 6 (six) hours as needed for moderate pain or headache.   apixaban (ELIQUIS) 2.5 MG TABS tablet Take 1 tablet (2.5 mg total) by mouth 2 (two) times daily.   Ascorbic Acid (VITAMIN C) 1000 MG tablet Take 1,000 mg by mouth daily.   cholecalciferol (VITAMIN D3) 25 MCG (1000 UNIT) tablet Take 1,000 Units by mouth daily.   hydrOXYzine (ATARAX) 10 MG tablet Take 5-10 mg by mouth 3 (three) times daily as needed for anxiety (sleep).   levothyroxine (SYNTHROID) 75 MCG tablet TAKE 1 TABLET BY MOUTH  DAILY EXCEPT ON THURSDAYS TAKE 1.5 TABLETS   losartan (COZAAR) 25 MG tablet Take 1 tablet by mouth in the morning and at bedtime.   metoprolol tartrate (LOPRESSOR) 25 MG tablet Take 1 tablet (25 mg total) by mouth 2 (two) times daily.   Multiple Vitamins-Minerals (MULTIVITAMIN MEN 50+) TABS Take 1 tablet by mouth daily.   pantoprazole (PROTONIX) 40 MG tablet Take 1 tablet (40 mg total) by mouth daily.   pramipexole (MIRAPEX) 1 MG tablet Take 1 tablet (1 mg total) by mouth daily.   REPATHA SURECLICK 333 MG/ML SOAJ INJECT 140MG EVERY 14 DAYS  tamsulosin (FLOMAX) 0.4 MG CAPS capsule Take 1 capsule (0.4 mg total) by mouth daily.     Allergies:   Penicillins, Ace inhibitors, Aspirin, Celebrex [celecoxib], and Lipitor [atorvastatin]   Social History   Socioeconomic History   Marital status: Married    Spouse name: Not on file   Number of children: Not on file   Years of education: Not on file   Highest education level: Not on file  Occupational History   Not on file  Tobacco Use   Smoking status: Former    Types: Cigarettes    Quit date: 09/04/2008    Years since quitting: 13.4   Smokeless tobacco: Former    Types: Chew   Tobacco comments:    HAs quit tobacco products 2009  Vaping Use   Vaping Use: Never used  Substance and Sexual Activity   Alcohol use: Yes    Comment: occ   Drug use: No   Sexual activity: Yes  Other Topics Concern   Not on file  Social History Narrative   Married 50+ years, 2 kids   [place] to play golf   Social Determinants of Health   Financial Resource Strain: Low Risk    Difficulty of Paying Living Expenses: Not hard at all  Food Insecurity: No Food Insecurity   Worried About Charity fundraiser in the Last Year: Never true   Arboriculturist in the Last Year: Never true  Transportation Needs: No Transportation Needs   Lack of Transportation (Medical): No   Lack of Transportation (Non-Medical): No  Physical Activity:  Insufficiently Active   Days of Exercise per Week: 3 days   Minutes of Exercise per Session: 30 min  Stress: No Stress Concern Present   Feeling of Stress : Not at all  Social Connections: Moderately Integrated   Frequency of Communication with Friends and Family: More than three times a week   Frequency of Social Gatherings with Friends and Family: More than three times a week   Attends Religious Services: More than 4 times per year   Active Member of [employer] or Organizations: No   Attends Archivist Meetings: Never   Marital Status: Married     Family History: The patient's family history includes Diabetes in his mother; Stroke in his father. There is no history of Colon cancer or Prostate cancer.  ROS:   Please see the history of present illness.     All other systems reviewed and are negative.  EKGs/Labs/Other Studies Reviewed:    The following studies were reviewed today:  Echo 09/19/2021 1. Left ventricular ejection fraction, by estimation, is 60 to 65%. The  left ventricle has normal function. The left ventricle has no regional  wall motion abnormalities. There is mild left ventricular hypertrophy.  Left ventricular diastolic parameters  are consistent with Grade I diastolic dysfunction (impaired relaxation).   2. Right ventricular systolic function is normal. The right ventricular  size is normal. There is normal pulmonary artery systolic pressure. The  estimated right ventricular systolic pressure is 62.7 mmHg.   3. The mitral valve is normal in structure. No evidence of mitral valve  regurgitation.   4. The aortic valve was not well visualized. Aortic valve regurgitation  is not visualized. Mild aortic valve stenosis. Vmax 2.2 m/s, MG 11 mmHg,  DI 0.51   5. The inferior vena cava is normal in size with greater than 50%  respiratory variability, suggesting right atrial pressure  of 3 mmHg.   EKG:  EKG is not ordered today.    Recent Labs: 08/25/2021: TSH  2.246 09/01/2021: Magnesium 2.1 12/07/2021: ALT 14; BUN 25; Creatinine, Ser 1.29; Potassium 4.5; Sodium 134 01/25/2022: Hemoglobin 13.2; Platelets 227  Recent Lipid Panel    Component Value Date/Time   CHOL 145 08/05/2021 0925   CHOL 119 04/27/2020 0000   CHOL 227 11/15/2012 0000   TRIG 72 08/05/2021 0925   TRIG 209 (A) 04/27/2020 0000   HDL 78 08/05/2021 0925   CHOLHDL 1.9 08/05/2021 0925   CHOLHDL 2 12/30/2020 1128   VLDL 50.0 (H) 12/30/2020 1128   LDLCALC 53 08/05/2021 0925   LDLCALC 23 04/27/2020 0000   LDLDIRECT 41.0 12/30/2020 1128     Risk Assessment/Calculations:    CHA2DS2-VASc Score = 4   This indicates a 4.8% annual risk of stroke. The patient's score is based upon: CHF History: 0 HTN History: 1 Diabetes History: 0 Stroke History: 0 Vascular Disease History: 1 Age Score: 2 Gender Score: 0          Physical Exam:    VS:  BP 135/60   Pulse 72   Ht _0  (1.702 m)   Wt 157 lb 3.2 oz (71.3 kg)   SpO2 98%   BMI 24.62 kg/m     Wt Readings from Last 3 Encounters:  01/25/22 157 lb 3.2 oz (71.3 kg)  12/29/21 157 lb (71.2 kg)  12/22/21 156 lb (70.8 kg)     GEN:  Well nourished, well developed in no acute distress HEENT: Normal NECK: No JVD; No carotid bruits LYMPHATICS: No lymphadenopathy CARDIAC: RRR, no murmurs, rubs, gallops RESPIRATORY:  Clear to auscultation without rales, wheezing or rhonchi  ABDOMEN: Soft, non-tender, non-distended MUSCULOSKELETAL:  No edema; No deformity  SKIN: Warm and dry NEUROLOGIC:  Alert and oriented x 3 PSYCHIATRIC:  Normal affect   ASSESSMENT:    1. PAF (paroxysmal atrial fibrillation) ([place])   2. Anemia, unspecified type   3. Coronary artery disease involving coronary bypass graft of native heart without angina pectoris   4. Primary hypertension   5. Mixed hyperlipidemia   6. PAD (peripheral artery disease) (H[facility])    PLAN:    In order of problems listed above:  Paroxysmal atrial fibrillation: Given the  improvement in the anemia, we recommend the patient restart Eliquis at a lower dose of 2.5 mg twice a day.  This was discussed with Dr. Gwenlyn Found.  Anemia: Patient previously had quite significant anemia and required iron infusion.  Recent blood work showed stable hemoglobin.  We will obtain CBC in 1 week and again in 3 to 4 weeks.  CAD s/p CABG: Denies any recent chest pain  Hypertension: Blood pressure stable  Hyperlipidemia: On Repatha  PAD: Previously underwent lower extremity stenting earlier this year.  Previously on Plavix, this has been switched to aspirin which was eventually discontinued due to significant anemia.  We will defer to vascular surgery to see if aspirin need to add back.           Medication Adjustments/Labs and Tests Ordered: Current medicines are reviewed at length with the patient today.  Concerns regarding medicines are outlined above.  Orders Placed This Encounter  Procedures   CBC   CBC   Meds ordered this encounter  Medications   apixaban (ELIQUIS) 2.5 MG TABS tablet    Sig: Take 1 tablet (2.5 mg total) by mouth 2 (two) times daily.    Dispense:  180 tablet  Refill:  3    Patient Instructions  Medication Instructions:  RESTART Eliquis 2.5 mg 2 times a day  *If you need a refill on your cardiac medications before your next appointment, please call your pharmacy*  Lab Work: Your physician recommends that you return for lab work TODAY:  CBC  Your physician recommends that you return for lab work in 3-4 weeks:  CBC  If you have labs (blood work) drawn today and your tests are completely normal, you will receive your results only by: [employer] (if you have MyChart) OR A paper copy in the mail If you have any lab test that is abnormal or we need to change your treatment, we will call you to review the results.  Testing/Procedures: NONE ordered at this time of appointment   Follow-Up: At [facility], you and your health needs are our  priority.  As part of our continuing mission to provide you with exceptional heart care, we have created designated Provider Care Teams.  These Care Teams include your primary Cardiologist (physician) and Advanced Practice Providers (APPs -  Physician Assistants and Nurse Practitioners) who all work together to provide you with the care you need, when you need it.    Your next appointment:   As previously scheduled    The format for your next appointment:   In Person  Provider:   Ida Rogue, MD    Other Instructions   Important Information About Sugar         Hilbert Corrigan, [place]  01/27/2022 11:15 PM    [place]

## 2022-01-26 LAB — CBC
Hematocrit: 39.8 % (ref 37.5–51.0)
Hemoglobin: 13.2 g/dL (ref 13.0–17.7)
MCH: 28.9 pg (ref 26.6–33.0)
MCHC: 33.2 g/dL (ref 31.5–35.7)
MCV: 87 fL (ref 79–97)
Platelets: 227 10*3/uL (ref 150–450)
RBC: 4.57 x10E6/uL (ref 4.14–5.80)
RDW: 19.2 % — ABNORMAL HIGH (ref 11.6–15.4)
WBC: 14.4 10*3/uL — ABNORMAL HIGH (ref 3.4–10.8)

## 2022-01-26 NOTE — Progress Notes (Signed)
Normal hemoglobin level. White blood cell count mildly elevated, monitor for any sign of infection such as fever or chills

## 2022-01-27 ENCOUNTER — Encounter: Payer: Self-pay | Admitting: Physician Assistant

## 2022-02-06 ENCOUNTER — Ambulatory Visit (INDEPENDENT_AMBULATORY_CARE_PROVIDER_SITE_OTHER): Payer: Medicare Other | Admitting: Cardiovascular Disease

## 2022-02-06 ENCOUNTER — Inpatient Hospital Stay: Payer: Medicare Other | Attending: Oncology

## 2022-02-06 ENCOUNTER — Encounter: Payer: Self-pay | Admitting: Cardiovascular Disease

## 2022-02-06 VITALS — BP 166/70 | HR 55 | Ht 67.0 in | Wt 158.0 lb

## 2022-02-06 DIAGNOSIS — D509 Iron deficiency anemia, unspecified: Secondary | ICD-10-CM | POA: Diagnosis not present

## 2022-02-06 DIAGNOSIS — I25118 Atherosclerotic heart disease of native coronary artery with other forms of angina pectoris: Secondary | ICD-10-CM

## 2022-02-06 DIAGNOSIS — I10 Essential (primary) hypertension: Secondary | ICD-10-CM | POA: Diagnosis not present

## 2022-02-06 DIAGNOSIS — I701 Atherosclerosis of renal artery: Secondary | ICD-10-CM

## 2022-02-06 DIAGNOSIS — D472 Monoclonal gammopathy: Secondary | ICD-10-CM | POA: Insufficient documentation

## 2022-02-06 DIAGNOSIS — D500 Iron deficiency anemia secondary to blood loss (chronic): Secondary | ICD-10-CM

## 2022-02-06 DIAGNOSIS — I739 Peripheral vascular disease, unspecified: Secondary | ICD-10-CM

## 2022-02-06 DIAGNOSIS — I480 Paroxysmal atrial fibrillation: Secondary | ICD-10-CM

## 2022-02-06 LAB — CBC WITH DIFFERENTIAL/PLATELET
Abs Immature Granulocytes: 0.16 10*3/uL — ABNORMAL HIGH (ref 0.00–0.07)
Basophils Absolute: 0.1 10*3/uL (ref 0.0–0.1)
Basophils Relative: 1 %
Eosinophils Absolute: 1 10*3/uL — ABNORMAL HIGH (ref 0.0–0.5)
Eosinophils Relative: 8 %
HCT: 37 % — ABNORMAL LOW (ref 39.0–52.0)
Hemoglobin: 12.1 g/dL — ABNORMAL LOW (ref 13.0–17.0)
Immature Granulocytes: 1 %
Lymphocytes Relative: 13 %
Lymphs Abs: 1.6 10*3/uL (ref 0.7–4.0)
MCH: 29.1 pg (ref 26.0–34.0)
MCHC: 32.7 g/dL (ref 30.0–36.0)
MCV: 88.9 fL (ref 80.0–100.0)
Monocytes Absolute: 1 10*3/uL (ref 0.1–1.0)
Monocytes Relative: 8 %
Neutro Abs: 8.4 10*3/uL — ABNORMAL HIGH (ref 1.7–7.7)
Neutrophils Relative %: 69 %
Platelets: 174 10*3/uL (ref 150–400)
RBC: 4.16 MIL/uL — ABNORMAL LOW (ref 4.22–5.81)
RDW: 19.5 % — ABNORMAL HIGH (ref 11.5–15.5)
WBC: 12.2 10*3/uL — ABNORMAL HIGH (ref 4.0–10.5)
nRBC: 0 % (ref 0.0–0.2)

## 2022-02-06 LAB — IRON AND TIBC
Iron: 92 ug/dL (ref 45–182)
Saturation Ratios: 25 % (ref 17.9–39.5)
TIBC: 375 ug/dL (ref 250–450)
UIBC: 283 ug/dL

## 2022-02-06 LAB — FERRITIN: Ferritin: 113 ng/mL (ref 24–336)

## 2022-02-06 NOTE — Progress Notes (Signed)
Cardiology Office Note  Date:  02/06/2022   ID:  Ryan Pittman, DOB 07/21/1941, MRN [id number]  PCP:  Tonia Ghent, MD   Chief Complaint  Patient presents with   6 month follow up     Establish care per Dr. Holley Raring to evaluate blood pressure medications due to kidney disease; patient is a former Dr. Gwenlyn Found patient. Medications reviewed by the patient verbally. Patient c/o shortness of breath with walking up an incline.     HPI:  Ryan Pittman is a 81 y.o. male with a hx of  CAD s/p CABG, hypertension, hyperlipidemia, hypothyroidism, IgG gammopathy and history of PAD.   tobacco abuse.   history of left femoral-popliteal bypass graft by Dr. Lucky Cowboy in [place] several years ago.   Who presents by referral from Dr. Holley Raring for consultation of his hypertension, PAD, coronary disease.  Has been followed for quite some time by Dr. Gwenlyn Found in [facility], [place]  Extensive coronary and PAD history reviewed with him on today's visit Cardiac history as below positive stress test and a subsequent coronary CTA on 05/24/2018 that revealed elevated calcium score of 2858 with three-vessel calcification.  He ultimately underwent cardiac catheterization shortly after that revealed three-vessel disease with preserved EF.  He underwent CABG x5 by Dr. Cyndia Bent.  He was placed on Repatha for his lipid profile.    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  He ultimately underwent right femoral enterectomy and patch angioplasty by Dr. Trula Slade.    diagnosed with atrial fibrillation with RVR when he presented to [facility] with chest pain.  He was placed on Eliquis therapy.   2 weeks heart monitor obtained in January 2023 showed sinus rhythm  with rare short episode of SVT, occasional PACs and PVCs , no atrial fibrillation.     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  It was felt nonbleeding vascular malformation could be a source of GI bleed.   right SFA stenting by Dr. Trula Slade on 10/25/2021.   Due to GI bleeding, his Plavix was discontinued and aspirin added.    Myoview on 11/18/2021 that showed EF 62%, no evidence of infarction or ischemia, overall low risk study.    Renal artery Doppler obtained in January 2023 demonstrated greater than 60% stenosis in the left renal artery.   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   underwent overlapping stents of right SFA by Dr. Trula Slade on 10/25/2021.  No significant renal artery stenosis was identified at that time.    Due to progressive anemia, Eliquis was temporarily taken off in March 2023.  He is currently being evaluated by hematology and oncology service, who felt iron deficiency is likely related to GI bleed.  He previously has underwent multiple GI evaluation earlier this year.  Multiple myeloma panel was somewhat abnormal   12/13/2021 off of Eliquis.   Given the significant anemia recently, Eliquis was not restarted It was recommended he continue PPI, continue metoprolol underwent a bone marrow biopsy on 12/21/2021, this revealed hypercellular bone marrow with  trilineage hematopoiesis.  Bone marrow did not show significant increase of plasma cell.  He has since been seen by [facility] oncology service on 01/10/2022.    Back on eliquis 2.5 BID restarted by Dr. Gwenlyn Found Reports he has been on Eliquis around 1 week CBC done today by hematology oncology hemoglobin 13 down to 11 May have noticed a change in stools yesterday but unsure Not on  aspirin Feels his BP has been relatively well controlled on metoprolol 25 BID, losartan 25 BP at home 135/60s, rate 55 BP with Dr. Holley Raring 125/60  HGB 12.1  10/25/21 LE stent placed, not on aspirin or Plavix  Denies any chest pain concerning for angina  EKG personally reviewed by myself on todays visit Sinus bradycardia rate 55 bpm no significant ST-T wave changes  PMH:   has a past medical history of Anemia, Anginal pain ([place]) (07/10/2018), Arthritis, Cancer ([place]), Chronic kidney disease, Colon polyps, Coronary artery disease, COVID-19, Dyspnea, Dysrhythmia (2020), GERD (gastroesophageal reflux disease), Hypercalcemia, Hyperlipidemia, Hypertension, Hypothyroidism, IgG gammopathy, Pneumonia, and PVD (peripheral vascular disease) ([place]).  PSH:    Past Surgical History:  Procedure Laterality Date    dental implant left bottom-did have bleeding      ABDOMINAL AORTOGRAM W/LOWER EXTREMITY Bilateral 06/26/2019   Procedure: ABDOMINAL AORTOGRAM W/LOWER EXTREMITY;  Surgeon: [name], MD;  Location: [place] [facility];  Service: Cardiovascular;  Laterality: Bilateral;   ABDOMINAL AORTOGRAM W/LOWER EXTREMITY N/A 06/08/2020   Procedure: ABDOMINAL AORTOGRAM W/LOWER EXTREMITY;  Surgeon: Serafina Mitchell, MD;  Location: [place] [facility];  Service: Cardiovascular;  Laterality: N/A;   BYPASS GRAFT     L leg   CARDIAC CATHETERIZATION  06/24/2018   COLONOSCOPY N/A 10/09/2021   Procedure: COLONOSCOPY;  Surgeon: Toledo, Benay Pike, MD;  Location: [facility] ENDOSCOPY;  Service: Gastroenterology;  Laterality: N/A;   COLONOSCOPY WITH PROPOFOL N/A 05/04/2015   Procedure: COLONOSCOPY WITH PROPOFOL;  Surgeon: Lollie Sails, MD;  Location[facility] ENDOSCOPY;  Service: Endoscopy;  Laterality: N/A;   CORONARY ARTERY BYPASS GRAFT N/A 07/11/2018   Procedure: CORONARY ARTERY BYPASS GRAFTING (CABG) x three, using left internal mammary artery and right leg greater saphenous vein harvested endoscopically;  Surgeon:  Gaye Pollack, MD;  Location: [place] OR;  Service: Open Heart Surgery;  Laterality: N/A;   ENDARTERECTOMY FEMORAL Right 02/13/2020   Procedure: RIGHT ENDARTERECTOMY FEMORAL WITH BOVINE PATCH;  Surgeon: Serafina Mitchell, MD;  Location: [facility] OR;  Service: Vascular;  Laterality: Right;   ESOPHAGOGASTRODUODENOSCOPY N/A 10/09/2021   Procedure: ESOPHAGOGASTRODUODENOSCOPY (EGD);  Surgeon: Toledo, Benay Pike, MD;  Location: [facility] ENDOSCOPY;  Service: Gastroenterology;  Laterality: N/A;   GIVENS CAPSULE STUDY N/A 10/09/2021   Procedure: GIVENS CAPSULE STUDY;  Surgeon: Toledo, Benay Pike, MD;  Location: [facility] ENDOSCOPY;  Service: Gastroenterology;  Laterality: N/A;   LEFT HEART CATH AND CORONARY ANGIOGRAPHY N/A 06/24/2018   Procedure: LEFT HEART CATH AND CORONARY ANGIOGRAPHY;  Surgeon: [name], MD;  Location: [place] [facility];  Service: Cardiovascular;  Laterality: N/A;   LOWER EXTREMITY ANGIOGRAPHY Bilateral 10/25/2021   Procedure: Lower Extremity Angiography;  Surgeon: Serafina Mitchell, MD;  Location: [place] [facility];  Service: Cardiovascular;  Laterality: Bilateral;   PERIPHERAL VASCULAR ATHERECTOMY Right 06/08/2020   Procedure: PERIPHERAL VASCULAR ATHERECTOMY;  Surgeon: Serafina Mitchell, MD;  Location: [place] [facility];  Service: Cardiovascular;  Laterality: Right;  superficial femoral   PERIPHERAL VASCULAR BALLOON ANGIOPLASTY Right 06/08/2020   Procedure: PERIPHERAL VASCULAR BALLOON ANGIOPLASTY;  Surgeon: Harold Barban  W, MD;  Location: [place] [facility];  Service: Cardiovascular;  Laterality: Right;  External iliac   PERIPHERAL VASCULAR INTERVENTION Right 10/25/2021   Procedure: PERIPHERAL VASCULAR INTERVENTION;  Surgeon: Serafina Mitchell, MD;  Location: [place] [facility];  Service: Cardiovascular;  Laterality: Right;  SFA   RENAL ANGIOGRAPHY Bilateral 10/25/2021   Procedure: RENAL ANGIOGRAPHY;  Surgeon: Serafina Mitchell, MD;  Location: [place] [facility];  Service: Cardiovascular;  Laterality:  Bilateral;   TEE WITHOUT CARDIOVERSION N/A 07/11/2018   Procedure: TRANSESOPHAGEAL ECHOCARDIOGRAM (TEE);  Surgeon: Gaye Pollack, MD;  Location: [place];  Service: Open Heart Surgery;  Laterality: N/A;   THYROIDECTOMY, PARTIAL  2016   VASCULAR SURGERY      Current Outpatient Medications  Medication Sig Dispense Refill   acetaminophen (TYLENOL) 500 MG tablet Take 1,000 mg by mouth every 6 (six) hours as needed for moderate pain or headache.     apixaban (ELIQUIS) 2.5 MG TABS tablet Take 1 tablet (2.5 mg total) by mouth 2 (two) times daily. 180 tablet 3   Ascorbic Acid (VITAMIN C) 1000 MG tablet Take 1,000 mg by mouth daily.     Cholecalciferol (DIALYVITE VITAMIN D 5000 PO) Take 5,000 Units by mouth daily.     hydrOXYzine (ATARAX) 10 MG tablet Take 5-10 mg by mouth 3 (three) times daily as needed for anxiety (sleep).     levothyroxine (SYNTHROID) 75 MCG tablet TAKE 1 TABLET BY MOUTH DAILY EXCEPT ON THURSDAYS TAKE 1.5 TABLETS 100 tablet 3   losartan (COZAAR) 25 MG tablet Take 1 tablet by mouth in the morning and at bedtime.     metoprolol tartrate (LOPRESSOR) 25 MG tablet Take 1 tablet (25 mg total) by mouth 2 (two) times daily. 180 tablet 3   Multiple Vitamins-Minerals (MULTIVITAMIN MEN 50+) TABS Take 1 tablet by mouth daily.     pantoprazole (PROTONIX) 40 MG tablet Take 1 tablet (40 mg total) by mouth daily. 90 tablet 3   pramipexole (MIRAPEX) 1 MG tablet Take 1 tablet (1 mg total) by mouth daily.     REPATHA SURECLICK 458 MG/ML SOAJ INJECT 140MG EVERY 14 DAYS 2 mL 11   tamsulosin (FLOMAX) 0.4 MG CAPS capsule Take 1 capsule (0.4 mg total) by mouth daily. 90 capsule 3   pramipexole (MIRAPEX) 1 MG tablet Take 1 tablet by mouth 3 (three) times daily. (Patient not taking: Reported on 01/25/2022)     No current facility-administered medications for this visit.    Allergies:   Penicillins, Ace inhibitors, Aspirin, Celebrex [celecoxib], and Lipitor [atorvastatin]   Social History:  The patient   reports that he quit smoking about 13 years ago. His smoking use included cigarettes. He has quit using smokeless tobacco.  His smokeless tobacco use included chew. He reports current alcohol use. He reports that he does not use drugs.   Family History:   family history includes Diabetes in his mother; Stroke in his father.    Review of Systems: Review of Systems  Constitutional: Negative.   HENT: Negative.    Respiratory: Negative.    Cardiovascular: Negative.   Gastrointestinal: Negative.   Musculoskeletal: Negative.   Neurological: Negative.   Psychiatric/Behavioral: Negative.    All other systems reviewed and are negative.   PHYSICAL EXAM: VS:  BP (!) 166/70 (BP Location: Left Arm, Patient Position: Sitting, Cuff Size: Normal)   Pulse (!) 55   Ht _0  (1.702 m)   Wt 158 lb (71.7 kg)   SpO2 98%  BMI 24.75 kg/m  , BMI Body mass index is 24.75 kg/m. GEN: Well nourished, well developed, in no acute distress HEENT: normal Neck: no JVD, carotid bruits, or masses Cardiac: RRR; no murmurs, rubs, or gallops,no edema  Respiratory:  clear to auscultation bilaterally, normal work of breathing GI: soft, nontender, nondistended, + BS MS: no deformity or atrophy Skin: warm and dry, no rash Neuro:  Strength and sensation are intact Psych: euthymic mood, full affect  Recent Labs: 08/25/2021: TSH 2.246 09/01/2021: Magnesium 2.1 12/07/2021: ALT 14; BUN 25; Creatinine, Ser 1.29; Potassium 4.5; Sodium 134 02/06/2022: Hemoglobin 12.1; Platelets 174    Lipid Panel Lab Results  Component Value Date   CHOL 145 08/05/2021   HDL 78 08/05/2021   LDLCALC 53 08/05/2021   TRIG 72 08/05/2021      Wt Readings from Last 3 Encounters:  02/06/22 158 lb (71.7 kg)  01/25/22 157 lb 3.2 oz (71.3 kg)  12/29/21 157 lb (71.2 kg)     ASSESSMENT AND PLAN:  Problem List Items Addressed This Visit       Cardiology Problems   Essential hypertension, benign   Relevant Orders   EKG 12-Lead    PAF (paroxysmal atrial fibrillation) (HCC)   Relevant Orders   EKG 12-Lead   PVD (peripheral vascular disease) ([place])   Relevant Orders   EKG 12-Lead   CAD (coronary artery disease) - Primary   Relevant Orders   EKG 12-Lead   CAD with stable angina Currently with no symptoms of angina. No further workup at this time. Continue current medication regimen. Non-smoker, cholesterol at goal  PAD Recent lower extremity intervention by vascular February 2023 Results discussed with him in detail Only recently restarted on Eliquis, he is not on aspirin or Plavix He has close monitoring of hemoglobin through oncology/hematology  Hyperlipidemia On PCSK9 inhibitor Cholesterol at goal  Paroxysmal atrial fibrillation On Eliquis 2.5 twice daily For recurrent GI bleed, may need to consider Watchman device Recommend he confirm he is taking PPI   Total encounter time more than 40 minutes  Greater than 50% was spent in counseling and coordination of care with the patient    Signed, Esmond Plants, M.D., Ph.D. [place], [place]

## 2022-02-06 NOTE — Patient Instructions (Addendum)
Medication Instructions:  No changes  Please check and see if you are taking pantopazole for stomach acid   If you need a refill on your cardiac medications before your next appointment, please call your pharmacy.   Lab work: No new labs needed  Testing/Procedures: No new testing needed  Follow-Up: At [facility], you and your health needs are our priority.  As part of our continuing mission to provide you with exceptional heart care, we have created designated Provider Care Teams.  These Care Teams include your primary Cardiologist (physician) and Advanced Practice Providers (APPs -  Physician Assistants and Nurse Practitioners) who all work together to provide you with the care you need, when you need it.  You will need a follow up appointment in 3 months, APP OK  Providers on your designated Care Team:   Murray Hodgkins, NP Christell Faith, PA-C Cadence Kathlen Mody, [place]  COVID-19 Vaccine Information can be found at: [email or web address] For questions related to vaccine distribution or appointments, please email [email or web address] or call [phone].

## 2022-02-08 ENCOUNTER — Inpatient Hospital Stay: Payer: Medicare Other

## 2022-02-08 ENCOUNTER — Inpatient Hospital Stay (HOSPITAL_BASED_OUTPATIENT_CLINIC_OR_DEPARTMENT_OTHER): Payer: Medicare Other | Admitting: Oncology

## 2022-02-08 ENCOUNTER — Encounter: Payer: Self-pay | Admitting: Oncology

## 2022-02-08 VITALS — BP 157/51 | HR 68 | Temp 96.5°F | Wt 159.0 lb

## 2022-02-08 DIAGNOSIS — D472 Monoclonal gammopathy: Secondary | ICD-10-CM | POA: Diagnosis not present

## 2022-02-08 DIAGNOSIS — Z7901 Long term (current) use of anticoagulants: Secondary | ICD-10-CM

## 2022-02-08 DIAGNOSIS — D509 Iron deficiency anemia, unspecified: Secondary | ICD-10-CM | POA: Diagnosis not present

## 2022-02-08 DIAGNOSIS — D500 Iron deficiency anemia secondary to blood loss (chronic): Secondary | ICD-10-CM

## 2022-02-08 MED ORDER — FERROUS SULFATE 325 (65 FE) MG PO TBEC: 325.0000 mg | DELAYED_RELEASE_TABLET | Freq: Every day | ORAL | 1 refills | Status: DC

## 2022-02-08 NOTE — Progress Notes (Signed)
Hematology/Oncology Consult note [phone] [phone]         Patient Care Team: Tonia Ghent, MD as PCP - General (Family Medicine) [name], MD as PCP - Cardiology (Cardiology) Lorelee Cover., MD as Consulting Physician (Ophthalmology) Ronda Fairly. Stoiff, MD as Consulting Physician (Urology) Macario Carls, [place], CCC-A as Consulting Physician (Audiology) Marilynn Rail, Jossie Ng, NP as Nurse Practitioner (Cardiology) Charlton Haws, [facility] as Pharmacist (Pharmacist) Efrain Sella, MD as Consulting Physician (Gastroenterology) Earlie Server, MD as Consulting Physician (Oncology) Ottie Glazier, MD as Consulting Physician (Pulmonary Disease)  REFERRING PROVIDER: Tonia Ghent, MD  CHIEF COMPLAINTS/REASON FOR VISIT:  Evaluation of anemia  HISTORY OF PRESENTING ILLNESS:   Ryan Pittman is a  81 y.o.  male with PMH listed below was seen in consultation at the request of  Tonia Ghent, MD  for evaluation of anemia  Patient reports feeling fatigued and tired. 11/22/2021, ED evaluation for low hemoglobin. + History of melena.  Patient has had extensive GI work-up including upper endoscopy, colonoscopy and capsule study in February.  No active bleeding source was discovered.  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  Patient was advised to take PPI.  Patient was on Eliquis for peripheral vascular disease, atrial fibrillation.  Eliquis has been stopped since mid March 2023. Patient reports that currently his stool color is brown.  Fatigue level is slightly better. Denies unintentional weight loss, fever, night sweats.   12/02/2021, recent CBC showed hemoglobin 8.9, MCV 88.3. Patient has had chronic leukocytosis with white count of 11.3 on 11/25/2021.  Denies any steroid use.  Patient has a history of IgG Kappa MGUS. 12/07/2021, multiple myeloma panel showed M protein of 2 which is significantly increased from his baseline. 12/21/2021,  bone marrow biopsy showed hypercellular marrow with trilineage hematopoiesis.  CD138 highlights plasma cells 1% which are polytypic by kappa and lambda in situ hybridization.  Iron stores absent.  Cytogenetics are pending.   INTERVAL HISTORY Ryan Pittman is a 81 y.o. male who has above history reviewed by me today presents for follow up visit for deficiency anemia, MGUS. Patient has had IV Venofer treatments.  He tolerates well.  Fatigue has improved He has resumed on Eliquis for 2 weeks.  He has noticed small amount of blood in the stool. .   Review of Systems  Constitutional:  Negative for appetite change, chills, fatigue, fever and unexpected weight change.  HENT:   Negative for hearing loss and voice change.   Eyes:  Negative for eye problems and icterus.  Respiratory:  Negative for chest tightness and cough.   Cardiovascular:  Negative for chest pain and leg swelling.  Gastrointestinal:  Negative for abdominal distention and abdominal pain.  Endocrine: Negative for hot flashes.  Genitourinary:  Negative for difficulty urinating, dysuria and frequency.   Musculoskeletal:  Negative for arthralgias.  Skin:  Negative for itching and rash.  Neurological:  Negative for light-headedness and numbness.  Hematological:  Negative for adenopathy. Does not bruise/bleed easily.  Psychiatric/Behavioral:  Negative for confusion.    MEDICAL HISTORY:  Past Medical History:  Diagnosis Date   Anemia    due to GIB, s/p transfusion   Anginal pain ([place]) 07/10/2018   Arthritis    back pain, much worse after consecutive golf rounds   Cancer [facility])    thyroid   Chronic kidney disease    Colon polyps    Coronary artery disease    COVID-19  Dyspnea    walking up a hill   Dysrhythmia 2020   GERD (gastroesophageal reflux disease)    Hypercalcemia    h/o, resolved as of 2012, prev due to high amount of calcium intake   Hyperlipidemia    Hypertension    Hypothyroidism    IgG gammopathy     stable as of 2012 per [facility]   Pneumonia    as a child   PVD (peripheral vascular disease) ([place])    L leg bypass, R leg stented    SURGICAL HISTORY: Past Surgical History:  Procedure Laterality Date    dental implant left bottom-did have bleeding      ABDOMINAL AORTOGRAM W/LOWER EXTREMITY Bilateral 06/26/2019   Procedure: ABDOMINAL AORTOGRAM W/LOWER EXTREMITY;  Surgeon: [name], MD;  Location: [place] [facility];  Service: Cardiovascular;  Laterality: Bilateral;   ABDOMINAL AORTOGRAM W/LOWER EXTREMITY N/A 06/08/2020   Procedure: ABDOMINAL AORTOGRAM W/LOWER EXTREMITY;  Surgeon: Serafina Mitchell, MD;  Location: [place] [facility];  Service: Cardiovascular;  Laterality: N/A;   BYPASS GRAFT     L leg   CARDIAC CATHETERIZATION  06/24/2018   COLONOSCOPY N/A 10/09/2021   Procedure: COLONOSCOPY;  Surgeon: Toledo, Benay Pike, MD;  Location: [facility] ENDOSCOPY;  Service: Gastroenterology;  Laterality: N/A;   COLONOSCOPY WITH PROPOFOL N/A 05/04/2015   Procedure: COLONOSCOPY WITH PROPOFOL;  Surgeon: Lollie Sails, MD;  Location: [facility];  Service: Endoscopy;  Laterality: N/A;   CORONARY ARTERY BYPASS GRAFT N/A 07/11/2018   Procedure: CORONARY ARTERY BYPASS GRAFTING (CABG) x three, using left internal mammary artery and right leg greater saphenous vein harvested endoscopically;  Surgeon: Gaye Pollack, MD;  Location: [place] OR;  Service: Open Heart Surgery;  Laterality: N/A;   ENDARTERECTOMY FEMORAL Right 02/13/2020   Procedure: RIGHT ENDARTERECTOMY FEMORAL WITH BOVINE PATCH;  Surgeon: Serafina Mitchell, MD;  Location: [facility] OR;  Service: Vascular;  Laterality: Right;   ESOPHAGOGASTRODUODENOSCOPY N/A 10/09/2021   Procedure: ESOPHAGOGASTRODUODENOSCOPY (EGD);  Surgeon: Toledo, Benay Pike, MD;  Location: [facility] ENDOSCOPY;  Service: Gastroenterology;  Laterality: N/A;   GIVENS CAPSULE STUDY N/A 10/09/2021   Procedure: GIVENS CAPSULE STUDY;  Surgeon: Toledo, Benay Pike, MD;  Location: [facility] ENDOSCOPY;  Service:  Gastroenterology;  Laterality: N/A;   LEFT HEART CATH AND CORONARY ANGIOGRAPHY N/A 06/24/2018   Procedure: LEFT HEART CATH AND CORONARY ANGIOGRAPHY;  Surgeon: [name], MD;  Location: [place] [facility];  Service: Cardiovascular;  Laterality: N/A;   LOWER EXTREMITY ANGIOGRAPHY Bilateral 10/25/2021   Procedure: Lower Extremity Angiography;  Surgeon: Serafina Mitchell, MD;  Location: [place] [facility];  Service: Cardiovascular;  Laterality: Bilateral;   PERIPHERAL VASCULAR ATHERECTOMY Right 06/08/2020   Procedure: PERIPHERAL VASCULAR ATHERECTOMY;  Surgeon: Serafina Mitchell, MD;  Location: [place] [facility];  Service: Cardiovascular;  Laterality: Right;  superficial femoral   PERIPHERAL VASCULAR BALLOON ANGIOPLASTY Right 06/08/2020   Procedure: PERIPHERAL VASCULAR BALLOON ANGIOPLASTY;  Surgeon: Serafina Mitchell, MD;  Location: [place] [facility];  Service: Cardiovascular;  Laterality: Right;  External iliac   PERIPHERAL VASCULAR INTERVENTION Right 10/25/2021   Procedure: PERIPHERAL VASCULAR INTERVENTION;  Surgeon: Serafina Mitchell, MD;  Location: [place] [facility];  Service: Cardiovascular;  Laterality: Right;  SFA   RENAL ANGIOGRAPHY Bilateral 10/25/2021   Procedure: RENAL ANGIOGRAPHY;  Surgeon: Serafina Mitchell, MD;  Location: [place] [facility];  Service: Cardiovascular;  Laterality: Bilateral;   TEE WITHOUT CARDIOVERSION N/A 07/11/2018   Procedure: TRANSESOPHAGEAL ECHOCARDIOGRAM (TEE);  Surgeon:  Gaye Pollack, MD;  Location: [facility] OR;  Service: Open Heart Surgery;  Laterality: N/A;   THYROIDECTOMY, PARTIAL  2016   VASCULAR SURGERY      SOCIAL HISTORY: Social History   Socioeconomic History   Marital status: Married    Spouse name: Not on file   Number of children: Not on file   Years of education: Not on file   Highest education level: Not on file  Occupational History   Not on file  Tobacco Use   Smoking status: Former    Types: Cigarettes    Quit date: 09/04/2008    Years since  quitting: 13.4   Smokeless tobacco: Former    Types: Chew   Tobacco comments:    HAs quit tobacco products 2009  Vaping Use   Vaping Use: Never used  Substance and Sexual Activity   Alcohol use: Yes    Comment: occ   Drug use: No   Sexual activity: Yes  Other Topics Concern   Not on file  Social History Narrative   Married 50+ years, 2 kids   [place] to play golf   Social Determinants of Health   Financial Resource Strain: Low Risk    Difficulty of Paying Living Expenses: Not hard at all  Food Insecurity: No Food Insecurity   Worried About Charity fundraiser in the Last Year: Never true   Arboriculturist in the Last Year: Never true  Transportation Needs: No Transportation Needs   Lack of Transportation (Medical): No   Lack of Transportation (Non-Medical): No  Physical Activity: Insufficiently Active   Days of Exercise per Week: 3 days   Minutes of Exercise per Session: 30 min  Stress: No Stress Concern Present   Feeling of Stress : Not at all  Social Connections: Moderately Integrated   Frequency of Communication with Friends and Family: More than three times a week   Frequency of Social Gatherings with Friends and Family: More than three times a week   Attends Religious Services: More than 4 times per year   Active Member of [employer] or Organizations: No   Attends Music therapist: Never   Marital Status: Married  Human resources officer Violence: Not At Risk   Fear of Current or Ex-Partner: No   Emotionally Abused: No   Physically Abused: No   Sexually Abused: No    FAMILY HISTORY: Family History  Problem Relation Age of Onset   Diabetes Mother    Stroke Father    Colon cancer Neg Hx    Prostate cancer Neg Hx     ALLERGIES:  is allergic to penicillins, ace inhibitors, aspirin, celebrex [celecoxib], and lipitor [atorvastatin].  MEDICATIONS:  Current Outpatient Medications  Medication Sig Dispense Refill   acetaminophen (TYLENOL)  500 MG tablet Take 1,000 mg by mouth every 6 (six) hours as needed for moderate pain or headache.     apixaban (ELIQUIS) 2.5 MG TABS tablet Take 1 tablet (2.5 mg total) by mouth 2 (two) times daily. 180 tablet 3   Ascorbic Acid (VITAMIN C) 1000 MG tablet Take 1,000 mg by mouth daily.     Cholecalciferol (DIALYVITE VITAMIN D 5000 PO) Take 5,000 Units by mouth daily.     ferrous sulfate 325 (65 FE) MG EC tablet Take 1 tablet (325 mg total) by mouth daily. 90 tablet 1   hydrOXYzine (ATARAX) 10 MG tablet Take 5-10 mg by mouth 3 (three) times daily as needed for  anxiety (sleep).     levothyroxine (SYNTHROID) 75 MCG tablet TAKE 1 TABLET BY MOUTH DAILY EXCEPT ON THURSDAYS TAKE 1.5 TABLETS 100 tablet 3   losartan (COZAAR) 25 MG tablet Take 1 tablet by mouth in the morning and at bedtime.     metoprolol tartrate (LOPRESSOR) 25 MG tablet Take 1 tablet (25 mg total) by mouth 2 (two) times daily. 180 tablet 3   Multiple Vitamins-Minerals (MULTIVITAMIN MEN 50+) TABS Take 1 tablet by mouth daily.     pantoprazole (PROTONIX) 40 MG tablet Take 1 tablet (40 mg total) by mouth daily. 90 tablet 3   pramipexole (MIRAPEX) 1 MG tablet Take 1 tablet (1 mg total) by mouth daily.     REPATHA SURECLICK 638 MG/ML SOAJ INJECT 140MG EVERY 14 DAYS 2 mL 11   tamsulosin (FLOMAX) 0.4 MG CAPS capsule Take 1 capsule (0.4 mg total) by mouth daily. 90 capsule 3   pramipexole (MIRAPEX) 1 MG tablet Take 1 tablet by mouth 3 (three) times daily. (Patient not taking: Reported on 01/25/2022)     No current facility-administered medications for this visit.     PHYSICAL EXAMINATION: ECOG PERFORMANCE STATUS: 1 - Symptomatic but completely ambulatory Vitals:   02/08/22 1301  BP: (!) 157/51  Pulse: 68  Temp: (!) 96.5 F (35.8 C)   Filed Weights   02/08/22 1301  Weight: 159 lb (72.1 kg)    Physical Exam Constitutional:      General: He is not in acute distress. HENT:     Head: Normocephalic and atraumatic.  Eyes:     General:  No scleral icterus. Cardiovascular:     Rate and Rhythm: Normal rate.     Heart sounds: Normal heart sounds.  Pulmonary:     Effort: Pulmonary effort is normal. No respiratory distress.     Breath sounds: No wheezing.  Abdominal:     General: Bowel sounds are normal. There is no distension.     Palpations: Abdomen is soft.  Musculoskeletal:        General: No deformity. Normal range of motion.     Cervical back: Normal range of motion and neck supple.  Skin:    General: Skin is warm and dry.     Findings: No erythema or rash.  Neurological:     General: No focal deficit present.     Mental Status: He is alert and oriented to person, place, and time. Mental status is at baseline.  Psychiatric:        Mood and Affect: Mood normal.    LABORATORY DATA:  I have reviewed the data as listed Lab Results  Component Value Date   WBC 12.2 (H) 02/06/2022   HGB 12.1 (L) 02/06/2022   HCT 37.0 (L) 02/06/2022   MCV 88.9 02/06/2022   PLT 174 02/06/2022   Recent Labs    08/05/21 0925 08/25/21 0305 10/07/21 1504 10/08/21 0358 10/10/21 0359 10/25/21 0542 11/07/21 1320 11/22/21 1753 11/25/21 1347 12/02/21 0917 12/07/21 1212  NA  --    < >  --    < > 137   < > 138 137 133* 135 134*  K  --    < >  --    < > 4.3   < > 4.3 5.0 4.8 4.6 4.5  CL  --    < >  --    < > 108   < > 102 107 102 103 104  CO2  --    < >  --    < >  25  --  '26 22 24 23 24  ' GLUCOSE  --    < >  --    < > 96   < > 93 112* 125* 95 93  BUN  --    < >  --    < > 13   < > 19 28* 29* 20 25*  CREATININE  --    < >  --    < > 1.21   < > 1.11 1.47* 1.84* 1.32 1.29*  CALCIUM  --    < >  --    < > 9.0  --  9.0 9.1 8.8 9.3 9.0  GFRNONAA  --    < >  --    < > 60*  --   --  47*  --   --  55*  PROT 6.4   < > 6.1*  --   --   --  6.2 6.2*  --   --  6.7  ALBUMIN 4.5   < > 3.5  --   --   --  4.1 3.8  --   --  4.2  AST 18  --  21  --   --   --  13 16  --   --  18  ALT 15  --  15  --   --   --  7 14  --   --  14  ALKPHOS 49  --  31*   --   --   --  38* 36*  --   --  41  BILITOT 0.3   < > 0.5  --   --   --  0.4 0.4  --   --  0.1*  BILIDIR 0.13  --  <0.1  --   --   --   --   --   --   --   --   IBILI  --   --  NOT CALCULATED  --   --   --   --   --   --   --   --    < > = values in this interval not displayed.    Iron/TIBC/Ferritin/ %Sat    Component Value Date/Time   IRON 92 02/06/2022 1049   TIBC 375 02/06/2022 1049   FERRITIN 113 02/06/2022 1049   IRONPCTSAT 25 02/06/2022 1049      RADIOGRAPHIC STUDIES: I have personally reviewed the radiological images as listed and agreed with the findings in the report. No results found.    ASSESSMENT & PLAN:  1. Iron deficiency anemia due to chronic blood loss   2. Chronic anticoagulation    #Iron deficiency anemia Labs reviewed and discussed with  Hemoglobin and iron panel have both improved  hold off additional IV Venofer treatment at this point. I recommend patient to take ferrous sulfate 325 mg daily with vitamin C as maintenance.  #Chronic anticoagulation.  Resumed on Eliquis.  Possible recurrent GI bleeding recommend patient to further discuss with gastroenterology and cardiology.   #MGUS, IgG kappa.  M protein is 2.  Bone marrow biopsy did not show significant increase of plasma cells.  Continue follow-up with [facility] oncology    Orders Placed This Encounter  Procedures   CBC with Differential/Platelet    Standing Status:   Future    Standing Expiration Date:   02/09/2023   Ferritin    Standing Status:   Future    Standing Expiration Date:   02/09/2023  Iron and TIBC    Standing Status:   Future    Standing Expiration Date:   02/09/2023    All questions were answered. The patient knows to call the clinic with any problems questions or concerns.  CC Tonia Ghent, MD    Return of visit: 3 months lab MD +/- Venofer.  Earlie Server, MD, PhD [facility] Hematology Oncology 02/08/2022

## 2022-02-13 ENCOUNTER — Encounter: Payer: Self-pay | Admitting: Family Medicine

## 2022-02-13 ENCOUNTER — Ambulatory Visit (INDEPENDENT_AMBULATORY_CARE_PROVIDER_SITE_OTHER): Payer: Medicare Other | Admitting: Family Medicine

## 2022-02-13 VITALS — BP 142/70 | HR 60 | Temp 97.6°F | Ht 67.0 in | Wt 159.0 lb

## 2022-02-13 DIAGNOSIS — R059 Cough, unspecified: Secondary | ICD-10-CM

## 2022-02-13 DIAGNOSIS — D649 Anemia, unspecified: Secondary | ICD-10-CM

## 2022-02-13 DIAGNOSIS — I701 Atherosclerosis of renal artery: Secondary | ICD-10-CM

## 2022-02-13 DIAGNOSIS — D62 Acute posthemorrhagic anemia: Secondary | ICD-10-CM | POA: Diagnosis not present

## 2022-02-13 MED ORDER — PRAMIPEXOLE DIHYDROCHLORIDE 1 MG PO TABS
1.0000 mg | ORAL_TABLET | Freq: Every day | ORAL | Status: DC | PRN
Start: 2022-02-13 — End: 2022-02-13

## 2022-02-13 NOTE — Progress Notes (Unsigned)
We talked about recent labs, eliquis, and iron use.  Not currently on iron.  He hasn't had dark stools the last few days but occasionally did prior.  He has had IV iron.  He doesn't feel unwell like when was sig anemic prior.    He has been taking mirapex 1 mg qd prn but not daily.  He didn't note a sig change with use.  D/w pt about stopping use for now.  See avs.    He has hematology f/u pending with [facility].    Minimal cough noted, occ at night.  Some occ sputum.  No fevers.  No wheeze.  Cough is getting better gradually.    He was asking about home testing for blood in stools.  I told him I would see about options that wouldn't give a false positive due to oral iron.    Prev IFOB positive.  H/o small bowel AVMs.  It is assumed that small bowel AVMs are the source of his GI loss.  Meds, vitals, and allergies reviewed.   ROS: Per HPI unless specifically indicated in ROS section   GEN: nad, alert and oriented HEENT: ncat NECK: supple w/o LA CV: Rrr with SEM noted.  PULM: ctab, no inc wob ABD: soft, +bs EXT: no edema SKIN: Well-perfused.

## 2022-02-13 NOTE — Patient Instructions (Addendum)
Stop mirapex and let me know if you have trouble in the meantime with restless legs.   Take care.  Glad to see you. Recheck labs in early July at a lab visit, sooner if needed.  Nonfasting visit.  Let me check on options in the meantime about checking your stool for blood at home.

## 2022-02-15 ENCOUNTER — Telehealth: Payer: Self-pay | Admitting: Family Medicine

## 2022-02-15 DIAGNOSIS — D649 Anemia, unspecified: Secondary | ICD-10-CM

## 2022-02-15 NOTE — Assessment & Plan Note (Signed)
His lungs are clear and his cough is improving.  I do not suspect an ominous process.  He can update me as needed.

## 2022-02-15 NOTE — Assessment & Plan Note (Signed)
History of.  Currently anticoagulated with Eliquis.  Would continue iron for now.  We can check CBC and iron later on.  See following phone note about home testing.

## 2022-02-15 NOTE — Telephone Encounter (Signed)
Ryan Pittman- my understanding is that we do not have guaiac cards that we can send home with the patient if he needs to do periodic testing for blood in his stool. My understanding is that the best option would be serial IFOBs.    Is it possible to set that up here in clinic?  Please let me know.  Thanks.

## 2022-02-17 ENCOUNTER — Other Ambulatory Visit: Payer: Self-pay | Admitting: Family Medicine

## 2022-02-19 ENCOUNTER — Telehealth: Payer: Self-pay | Admitting: Family Medicine

## 2022-02-19 DIAGNOSIS — K769 Liver disease, unspecified: Secondary | ICD-10-CM

## 2022-02-19 NOTE — Telephone Encounter (Signed)
Please check with patient.  We talked about likely liver cysts on previous imaging.  This was noted on CT in September 2022.  We had talked about getting a follow-up study.  I pended the order for liver ultrasound below.  If he consents and I will sign the order.  Thanks.

## 2022-02-19 NOTE — Telephone Encounter (Signed)
Please send patient 3 IFOBs.  I put in the order.  He can check that monthly.  I think it makes sense to recheck his hemoglobin/iron here in about 1 month.  Thanks.

## 2022-02-20 NOTE — Progress Notes (Unsigned)
HISTORY AND PHYSICAL     CC:  follow up. Requesting Provider:  Tonia Ghent, MD  HPI: This is a 81 y.o. male who is here today for follow up for PAD.  Pt has hx of right iliofemoral endarterectomy with bovine pericardial patch angioplasty on 02/13/20 by Dr. Trula Slade for severe right leg claudication.   in October 2021, he underwent drug coated balloon angioplasty of the right EIA and CFA and atherectomy with drug coated balloon angioplasty of the right SFA by Dr. Trula Slade.  Pt also has hx of left femoral to below knee popliteal bypass with vein by Dr. Lucky Cowboy in [place].  He has known RAS.  He did have selective renal angiography, which did not identify any significant stenosis.    Pt was seen in February and he was continuing to have RLE claudication.   He underwent stenting of the right SFA on 10/25/2021 by Dr. Trula Slade.    Pt was last seen 11/28/2021 and at that time, pt had been having issues with dropping his hgb and he had been taken off of Eliquis and underwent full workup but did not discover etiology.  He was also taken off of antiplatelet medication.  Pt has hx of CABG in 2019.   The pt returns today for follow up.  ***  Pt works at [employer].  The pt is on a statin for cholesterol management.   (Repatha) The pt *** on an aspirin.    Other AC:  *** The pt is on BB, ARB for hypertension.  The pt does not have diabetes. Tobacco hx:  former  Pt does not have family hx of AAA.  Past Medical History:  Diagnosis Date   Anemia    due to GIB, s/p transfusion   Anginal pain ([place]) 07/10/2018   Arthritis    back pain, much worse after consecutive golf rounds   Cancer [facility])    thyroid   Chronic kidney disease    Colon polyps    Coronary artery disease    COVID-19    Dyspnea    walking up a hill   Dysrhythmia 2020   GERD (gastroesophageal reflux disease)    Hypercalcemia    h/o, resolved as of 2012, prev due to high amount of calcium intake   Hyperlipidemia     Hypertension    Hypothyroidism    IgG gammopathy    stable as of 2012 per [facility]   Pneumonia    as a child   PVD (peripheral vascular disease) ([place])    L leg bypass, R leg stented    Past Surgical History:  Procedure Laterality Date    dental implant left bottom-did have bleeding      ABDOMINAL AORTOGRAM W/LOWER EXTREMITY Bilateral 06/26/2019   Procedure: ABDOMINAL AORTOGRAM W/LOWER EXTREMITY;  Surgeon: [name], MD;  Location: [place] [facility];  Service: Cardiovascular;  Laterality: Bilateral;   ABDOMINAL AORTOGRAM W/LOWER EXTREMITY N/A 06/08/2020   Procedure: ABDOMINAL AORTOGRAM W/LOWER EXTREMITY;  Surgeon: Serafina Mitchell, MD;  Location: [place] [facility];  Service: Cardiovascular;  Laterality: N/A;   BYPASS GRAFT     L leg   CARDIAC CATHETERIZATION  06/24/2018   COLONOSCOPY N/A 10/09/2021   Procedure: COLONOSCOPY;  Surgeon: Toledo, Benay Pike, MD;  Location: [facility] ENDOSCOPY;  Service: Gastroenterology;  Laterality: N/A;   COLONOSCOPY WITH PROPOFOL N/A 05/04/2015   Procedure: COLONOSCOPY WITH PROPOFOL;  Surgeon: Lollie Sails, MD;  Location: [facility] ENDOSCOPY;  Service: Endoscopy;  Laterality: N/A;  CORONARY ARTERY BYPASS GRAFT N/A 07/11/2018   Procedure: CORONARY ARTERY BYPASS GRAFTING (CABG) x three, using left internal mammary artery and right leg greater saphenous vein harvested endoscopically;  Surgeon: Gaye Pollack, MD;  Location: [place] OR;  Service: Open Heart Surgery;  Laterality: N/A;   ENDARTERECTOMY FEMORAL Right 02/13/2020   Procedure: RIGHT ENDARTERECTOMY FEMORAL WITH BOVINE PATCH;  Surgeon: Serafina Mitchell, MD;  Location: [facility];  Service: Vascular;  Laterality: Right;   ESOPHAGOGASTRODUODENOSCOPY N/A 10/09/2021   Procedure: ESOPHAGOGASTRODUODENOSCOPY (EGD);  Surgeon: Toledo, Benay Pike, MD;  Location: [facility] ENDOSCOPY;  Service: Gastroenterology;  Laterality: N/A;   GIVENS CAPSULE STUDY N/A 10/09/2021   Procedure: GIVENS CAPSULE STUDY;  Surgeon: Toledo, Benay Pike, MD;   Location: [facility] ENDOSCOPY;  Service: Gastroenterology;  Laterality: N/A;   LEFT HEART CATH AND CORONARY ANGIOGRAPHY N/A 06/24/2018   Procedure: LEFT HEART CATH AND CORONARY ANGIOGRAPHY;  Surgeon: [name], MD;  Location: [place] [facility];  Service: Cardiovascular;  Laterality: N/A;   LOWER EXTREMITY ANGIOGRAPHY Bilateral 10/25/2021   Procedure: Lower Extremity Angiography;  Surgeon: Serafina Mitchell, MD;  Location: [place] [facility];  Service: Cardiovascular;  Laterality: Bilateral;   PERIPHERAL VASCULAR ATHERECTOMY Right 06/08/2020   Procedure: PERIPHERAL VASCULAR ATHERECTOMY;  Surgeon: Serafina Mitchell, MD;  Location: [place] [facility];  Service: Cardiovascular;  Laterality: Right;  superficial femoral   PERIPHERAL VASCULAR BALLOON ANGIOPLASTY Right 06/08/2020   Procedure: PERIPHERAL VASCULAR BALLOON ANGIOPLASTY;  Surgeon: Serafina Mitchell, MD;  Location: [place] [facility];  Service: Cardiovascular;  Laterality: Right;  External iliac   PERIPHERAL VASCULAR INTERVENTION Right 10/25/2021   Procedure: PERIPHERAL VASCULAR INTERVENTION;  Surgeon: Serafina Mitchell, MD;  Location: [place] [facility];  Service: Cardiovascular;  Laterality: Right;  SFA   RENAL ANGIOGRAPHY Bilateral 10/25/2021   Procedure: RENAL ANGIOGRAPHY;  Surgeon: Serafina Mitchell, MD;  Location: [place] [facility];  Service: Cardiovascular;  Laterality: Bilateral;   TEE WITHOUT CARDIOVERSION N/A 07/11/2018   Procedure: TRANSESOPHAGEAL ECHOCARDIOGRAM (TEE);  Surgeon: Gaye Pollack, MD;  Location: [place];  Service: Open Heart Surgery;  Laterality: N/A;   THYROIDECTOMY, PARTIAL  2016   VASCULAR SURGERY      Allergies  Allergen Reactions   Penicillins Itching and Other (See Comments)    PATIENT HAS HAD A PCN REACTION WITH IMMEDIATE RASH, FACIAL/TONGUE/THROAT SWELLING, SOB, OR LIGHTHEADEDNESS WITH HYPOTENSION:  #  #  YES  #  #  Has patient had a PCN reaction causing severe rash involving mucus membranes or skin necrosis:  No Has patient had a PCN reaction that required hospitalization: No Has patient had a PCN reaction occurring within the last 10 years: No If all of the above answers are "NO", then may proceed with Cephalosporin use.    Ace Inhibitors Cough   Aspirin Other (See Comments)    H/o GI bleed   Celebrex [Celecoxib] Other (See Comments)    GI bleed   Lipitor [Atorvastatin] Other (See Comments)    myalgias    Current Outpatient Medications  Medication Sig Dispense Refill   acetaminophen (TYLENOL) 500 MG tablet Take 1,000 mg by mouth every 6 (six) hours as needed for moderate pain or headache.     apixaban (ELIQUIS) 2.5 MG TABS tablet Take 1 tablet (2.5 mg total) by mouth 2 (two) times daily. 180 tablet 3   Ascorbic Acid (VITAMIN C) 1000 MG tablet Take 1,000 mg by mouth daily.     Cholecalciferol (DIALYVITE VITAMIN D 5000  PO) Take 5,000 Units by mouth daily.     ferrous sulfate 325 (65 FE) MG EC tablet Take 1 tablet (325 mg total) by mouth daily. 90 tablet 1   hydrOXYzine (ATARAX) 10 MG tablet ONE-HALF TO ONE TABLET 3 TIMES DAILY AS NEEDED FOR ANXIETY 30 tablet 2   levothyroxine (SYNTHROID) 75 MCG tablet TAKE 1 TABLET BY MOUTH DAILY EXCEPT ON THURSDAYS TAKE 1.5 TABLETS 100 tablet 3   losartan (COZAAR) 25 MG tablet Take 1 tablet by mouth in the morning and at bedtime.     metoprolol tartrate (LOPRESSOR) 25 MG tablet Take 1 tablet (25 mg total) by mouth 2 (two) times daily. 180 tablet 3   Multiple Vitamins-Minerals (MULTIVITAMIN MEN 50+) TABS Take 1 tablet by mouth daily.     pantoprazole (PROTONIX) 40 MG tablet Take 1 tablet (40 mg total) by mouth daily. 90 tablet 3   REPATHA SURECLICK 341 MG/ML SOAJ INJECT '140MG'$  EVERY 14 DAYS 2 mL 11   tamsulosin (FLOMAX) 0.4 MG CAPS capsule Take 1 capsule (0.4 mg total) by mouth daily. 90 capsule 3   No current facility-administered medications for this visit.    Family History  Problem Relation Age of Onset   Diabetes Mother    Stroke Father    Colon  cancer Neg Hx    Prostate cancer Neg Hx     Social History   Socioeconomic History   Marital status: Married    Spouse name: Not on file   Number of children: Not on file   Years of education: Not on file   Highest education level: Not on file  Occupational History   Not on file  Tobacco Use   Smoking status: Former    Types: Cigarettes    Quit date: 09/04/2008    Years since quitting: 13.4   Smokeless tobacco: Former    Types: Chew   Tobacco comments:    HAs quit tobacco products 2009  Vaping Use   Vaping Use: Never used  Substance and Sexual Activity   Alcohol use: Yes    Comment: occ   Drug use: No   Sexual activity: Yes  Other Topics Concern   Not on file  Social History Narrative   Married 50+ years, 2 kids   Runs [employer]   Likes to play golf   Social Determinants of Health   Financial Resource Strain: Low Risk  (12/22/2021)   Overall Financial Resource Strain (CARDIA)    Difficulty of Paying Living Expenses: Not hard at all  Food Insecurity: No Food Insecurity (12/22/2021)   Hunger Vital Sign    Worried About Running Out of Food in the Last Year: Never true    [place] in the Last Year: Never true  Transportation Needs: No Transportation Needs (12/22/2021)   PRAPARE - Hydrologist (Medical): No    Lack of Transportation (Non-Medical): No  Physical Activity: Insufficiently Active (12/22/2021)   Exercise Vital Sign    Days of Exercise per Week: 3 days    Minutes of Exercise per Session: 30 min  Stress: No Stress Concern Present (12/22/2021)   [place]    Feeling of Stress : Not at all  Social Connections: Moderately Integrated (12/22/2021)   Social Connection and Isolation Panel [NHANES]    Frequency of Communication with Friends and Family: More than three times a week    Frequency of Social Gatherings with Friends and  Family: More than three times a  week    Attends Religious Services: More than 4 times per year    Active Member of Clubs or Organizations: No    Attends Archivist Meetings: Never    Marital Status: Married  Human resources officer Violence: Not At Risk (12/22/2021)   Humiliation, Afraid, Rape, and Kick questionnaire    Fear of Current or Ex-Partner: No    Emotionally Abused: No    Physically Abused: No    Sexually Abused: No     REVIEW OF SYSTEMS:  *** '[X]'$  denotes positive finding, '[ ]'$  denotes negative finding Cardiac  Comments:  Chest pain or chest pressure:    Shortness of breath upon exertion:    Short of breath when lying flat:    Irregular heart rhythm:        Vascular    Pain in calf, thigh, or hip brought on by ambulation:    Pain in feet at night that wakes you up from your sleep:     Blood clot in your veins:    Leg swelling:         Pulmonary    Oxygen at home:    Productive cough:     Wheezing:         Neurologic    Sudden weakness in arms or legs:     Sudden numbness in arms or legs:     Sudden onset of difficulty speaking or slurred speech:    Temporary loss of vision in one eye:     Problems with dizziness:         Gastrointestinal    Blood in stool:     Vomited blood:         Genitourinary    Burning when urinating:     Blood in urine:        Psychiatric    Major depression:         Hematologic    Bleeding problems:    Problems with blood clotting too easily:        Skin    Rashes or ulcers:        Constitutional    Fever or chills:      PHYSICAL EXAMINATION:  ***  General:  WDWN in NAD; vital signs documented above Gait: Not observed HENT: WNL, normocephalic Pulmonary: normal non-labored breathing , without wheezing Cardiac: {Desc; regular/irreg:14544} HR, {With/Without:20273} carotid bruit*** Abdomen: soft, NT, no masses; aortic pulse is *** palpable Skin: {With/Without:20273} rashes Vascular Exam/Pulses:  Right Left  Radial {Exam; arterial pulse  strength 0-4:30167} {Exam; arterial pulse strength 0-4:30167}  Femoral {Exam; arterial pulse strength 0-4:30167} {Exam; arterial pulse strength 0-4:30167}  Popliteal {Exam; arterial pulse strength 0-4:30167} {Exam; arterial pulse strength 0-4:30167}  DP {Exam; arterial pulse strength 0-4:30167} {Exam; arterial pulse strength 0-4:30167}  PT {Exam; arterial pulse strength 0-4:30167} {Exam; arterial pulse strength 0-4:30167}  Peroneal *** ***   Extremities: {With/Without:20273} ischemic changes, {With/Without:20273} Gangrene , {With/Without:20273} cellulitis; {With/Without:20273} open wounds Musculoskeletal: no muscle wasting or atrophy  Neurologic: A&O X 3 Psychiatric:  The pt has {Desc; normal/abnormal:11317::"Normal"} affect.   Non-Invasive Vascular Imaging:   ABI's/TBI's on 02/27/2022: Right:  *** - Great toe pressure: *** Left:  *** - Great toe pressure: ***  Arterial duplex on 02/27/2022: ***  Previous ABI's/TBI's on 11/28/2021: Right:  1.17/0.36 - Great toe pressure: 55 Left:  1.19/0.46 - Great toe pressure:  71  Previous arterial duplex on 11/28/2021: Right: Patent stent with no evidence of stenosis  in the Proximal SFA artery.   Left: Patent left fem-pop bypass graft with no evidence of stenosis.     ASSESSMENT/PLAN:: 81 y.o. male here for follow up for PAD with hx of ***  -*** -continue *** -pt will f/u in *** with ***.   Leontine Locket, [facility] [phone]  Clinic MD:   Trula Slade

## 2022-02-20 NOTE — Telephone Encounter (Signed)
Ordered. Thanks

## 2022-02-20 NOTE — Telephone Encounter (Signed)
Left a message on voicemail for patient to call the office back. 

## 2022-02-20 NOTE — Telephone Encounter (Signed)
Patent notified as instructed by telephone and verbalized understanding. Patient stated that he is okay to do anything that Dr. Damita Dunnings recommends. Patient was advised that one of the referral coordinators will be in touch to get this set up for him.

## 2022-02-22 ENCOUNTER — Encounter: Payer: Self-pay | Admitting: *Deleted

## 2022-02-23 DIAGNOSIS — C44319 Basal cell carcinoma of skin of other parts of face: Secondary | ICD-10-CM | POA: Diagnosis not present

## 2022-02-27 ENCOUNTER — Ambulatory Visit (INDEPENDENT_AMBULATORY_CARE_PROVIDER_SITE_OTHER): Payer: Medicare Other | Admitting: Physician Assistant

## 2022-02-27 ENCOUNTER — Ambulatory Visit (HOSPITAL_COMMUNITY)
Admission: RE | Admit: 2022-02-27 | Discharge: 2022-02-27 | Disposition: A | Discharge status: Home / Self Care | Payer: Medicare Other | Source: Ambulatory Visit | Attending: Surgery | Admitting: Surgery

## 2022-02-27 ENCOUNTER — Ambulatory Visit (INDEPENDENT_AMBULATORY_CARE_PROVIDER_SITE_OTHER)
Admission: RE | Admit: 2022-02-27 | Discharge: 2022-02-27 | Disposition: A | Discharge status: Home / Self Care | Payer: Medicare Other | Source: Ambulatory Visit | Attending: Surgery | Admitting: Surgery

## 2022-02-27 ENCOUNTER — Other Ambulatory Visit (INDEPENDENT_AMBULATORY_CARE_PROVIDER_SITE_OTHER): Payer: Medicare Other

## 2022-02-27 VITALS — BP 155/62 | HR 62 | Temp 97.8°F | Resp 14 | Ht 67.0 in | Wt 156.0 lb

## 2022-02-27 DIAGNOSIS — I739 Peripheral vascular disease, unspecified: Secondary | ICD-10-CM | POA: Insufficient documentation

## 2022-02-27 DIAGNOSIS — I701 Atherosclerosis of renal artery: Secondary | ICD-10-CM | POA: Diagnosis not present

## 2022-02-27 DIAGNOSIS — D649 Anemia, unspecified: Secondary | ICD-10-CM

## 2022-02-27 DIAGNOSIS — I70213 Atherosclerosis of native arteries of extremities with intermittent claudication, bilateral legs: Secondary | ICD-10-CM | POA: Diagnosis not present

## 2022-02-28 LAB — FECAL OCCULT BLOOD, IMMUNOCHEMICAL: Fecal Occult Bld: NEGATIVE

## 2022-03-03 ENCOUNTER — Other Ambulatory Visit: Payer: Self-pay

## 2022-03-03 DIAGNOSIS — I739 Peripheral vascular disease, unspecified: Secondary | ICD-10-CM

## 2022-03-03 DIAGNOSIS — I70213 Atherosclerosis of native arteries of extremities with intermittent claudication, bilateral legs: Secondary | ICD-10-CM

## 2022-03-13 ENCOUNTER — Other Ambulatory Visit: Payer: Medicare Other

## 2022-03-14 ENCOUNTER — Ambulatory Visit: Payer: Medicare Other | Admitting: Oncology

## 2022-03-14 ENCOUNTER — Ambulatory Visit: Payer: Medicare Other

## 2022-03-22 ENCOUNTER — Other Ambulatory Visit (INDEPENDENT_AMBULATORY_CARE_PROVIDER_SITE_OTHER): Payer: Medicare Other

## 2022-03-22 DIAGNOSIS — D649 Anemia, unspecified: Secondary | ICD-10-CM | POA: Diagnosis not present

## 2022-03-22 LAB — CBC WITH DIFFERENTIAL/PLATELET
Basophils Absolute: 0.1 10*3/uL (ref 0.0–0.1)
Basophils Relative: 1.2 % (ref 0.0–3.0)
Eosinophils Absolute: 0.2 10*3/uL (ref 0.0–0.7)
Eosinophils Relative: 1.8 % (ref 0.0–5.0)
HCT: 38.8 % — ABNORMAL LOW (ref 39.0–52.0)
Hemoglobin: 12.8 g/dL — ABNORMAL LOW (ref 13.0–17.0)
Lymphocytes Relative: 13.8 % (ref 12.0–46.0)
Lymphs Abs: 1.5 10*3/uL (ref 0.7–4.0)
MCHC: 33 g/dL (ref 30.0–36.0)
MCV: 91.1 fl (ref 78.0–100.0)
Monocytes Absolute: 0.9 10*3/uL (ref 0.1–1.0)
Monocytes Relative: 8.4 % (ref 3.0–12.0)
Neutro Abs: 8 10*3/uL — ABNORMAL HIGH (ref 1.4–7.7)
Neutrophils Relative %: 74.8 % (ref 43.0–77.0)
Platelets: 218 10*3/uL (ref 150.0–400.0)
RBC: 4.25 Mil/uL (ref 4.22–5.81)
RDW: 20.8 % — ABNORMAL HIGH (ref 11.5–15.5)
WBC: 10.7 10*3/uL — ABNORMAL HIGH (ref 4.0–10.5)

## 2022-03-22 LAB — IRON: Iron: 98 ug/dL (ref 42–165)

## 2022-03-24 ENCOUNTER — Telehealth: Payer: Self-pay

## 2022-03-24 ENCOUNTER — Other Ambulatory Visit (INDEPENDENT_AMBULATORY_CARE_PROVIDER_SITE_OTHER): Payer: Medicare Other

## 2022-03-24 ENCOUNTER — Telehealth: Payer: Self-pay | Admitting: Radiology

## 2022-03-24 DIAGNOSIS — D649 Anemia, unspecified: Secondary | ICD-10-CM

## 2022-03-24 LAB — FECAL OCCULT BLOOD, IMMUNOCHEMICAL: Fecal Occult Bld: POSITIVE — AB

## 2022-03-24 NOTE — Chronic Care Management (AMB) (Signed)
    Chronic Care Management Pharmacy Assistant   Name: Ryan Pittman  MRN: [id number] DOB: 1940/11/15   Reason for Encounter: Reminder Call    Conditions to be addressed/monitored: HTN, HLD, and CKD Stage 3   Hospital visits 11/22/21-[name],MD([facility])-anemia,discharged to home,close monitor of hemoglobin through PCP. [name] Medication Reconciliation was completed by comparing discharge summary, patient's EMR and Pharmacy list, and upon discussion with patient.  Admitted to the hospital on 10/07/21 due to Anemia . Discharge date was 10/10/21. Discharged from [facility].   Medications that remain the same after Hospital Discharge:??  -All other medications will remain the same.     Medications: Outpatient Encounter Medications as of 03/24/2022  Medication Sig   acetaminophen (TYLENOL) 500 MG tablet Take 1,000 mg by mouth every 6 (six) hours as needed for moderate pain or headache.   apixaban (ELIQUIS) 2.5 MG TABS tablet Take 1 tablet (2.5 mg total) by mouth 2 (two) times daily.   Ascorbic Acid (VITAMIN C) 1000 MG tablet Take 1,000 mg by mouth daily.   Cholecalciferol (DIALYVITE VITAMIN D 5000 PO) Take 5,000 Units by mouth daily.   ferrous sulfate 325 (65 FE) MG EC tablet Take 1 tablet (325 mg total) by mouth daily.   hydrOXYzine (ATARAX) 10 MG tablet ONE-HALF TO ONE TABLET 3 TIMES DAILY AS NEEDED FOR ANXIETY   levothyroxine (SYNTHROID) 75 MCG tablet TAKE 1 TABLET BY MOUTH DAILY EXCEPT ON THURSDAYS TAKE 1.5 TABLETS   losartan (COZAAR) 25 MG tablet Take 1 tablet by mouth in the morning and at bedtime.   metoprolol tartrate (LOPRESSOR) 25 MG tablet Take 1 tablet (25 mg total) by mouth 2 (two) times daily.   Multiple Vitamins-Minerals (MULTIVITAMIN MEN 50+) TABS Take 1 tablet by mouth daily.   pantoprazole (PROTONIX) 40 MG tablet Take 1 tablet (40 mg total) by mouth daily.   REPATHA SURECLICK 749 MG/ML  SOAJ INJECT '140MG'$  EVERY 14 DAYS   tamsulosin (FLOMAX) 0.4 MG CAPS capsule Take 1 capsule (0.4 mg total) by mouth daily.   No facility-administered encounter medications on file as of 03/24/2022.   [name] was contacted to remind of upcoming telephone visit with [name]  on 03/29/22 at 11:00am. Patient was reminded to have any blood glucose and blood pressure readings available for review at appointment.   Patient confirmed appointment.   Are you having any problems with your medications? No   Do you have any concerns you like to discuss with the pharmacist? No   CCM referral has been placed prior to visit?  No    Star Rating Drugs: Medication:  Last Fill: Day Supply Losartan '25mg'$  03/04/22  [place], CPP notified  [name], [place]  [phone]

## 2022-03-24 NOTE — Telephone Encounter (Signed)
[facility] called a positive Ifob, results given to Dr Damita Dunnings

## 2022-03-26 NOTE — Telephone Encounter (Signed)
Please call patient.  Stool testing for blood was positive, meaning he has blood in his stool.  It is presumed that he has blood loss from his small bowel.   Fortunately his iron level is stable and his hemoglobin has improved compared to 1 month ago.  I suspect that he will have intermittent blood in his stool as long as he is anticoagulated.  It also could happen even if he were not anticoagulated.  Since his hemoglobin is not lower, I think it makes sense to recheck his iron and hemoglobin in about 1 week.  If he has fatigue, shortness of breath, new stool changes or gross blood in his stool, then stop Eliquis in the meantime and let me know.  Please schedule lab visit.  As long as his hemoglobin and iron remained stable, it may be okay to continue with Eliquis as is.  Thanks.

## 2022-03-27 NOTE — Telephone Encounter (Signed)
LMTCB

## 2022-03-27 NOTE — Telephone Encounter (Signed)
Patient called back and I discussed his lab work with him. Patient verbalized understanding and will return on 04/03/22 for repeat labs.

## 2022-03-29 ENCOUNTER — Ambulatory Visit: Payer: Medicare Other | Admitting: Pharmacist

## 2022-03-29 DIAGNOSIS — I10 Essential (primary) hypertension: Secondary | ICD-10-CM

## 2022-03-29 DIAGNOSIS — E782 Mixed hyperlipidemia: Secondary | ICD-10-CM

## 2022-03-29 DIAGNOSIS — N1831 Chronic kidney disease, stage 3a: Secondary | ICD-10-CM

## 2022-03-29 DIAGNOSIS — D649 Anemia, unspecified: Secondary | ICD-10-CM

## 2022-03-29 DIAGNOSIS — I2510 Atherosclerotic heart disease of native coronary artery without angina pectoris: Secondary | ICD-10-CM

## 2022-03-29 DIAGNOSIS — I480 Paroxysmal atrial fibrillation: Secondary | ICD-10-CM

## 2022-03-29 NOTE — Progress Notes (Signed)
Chronic Care Management Pharmacy Note  04/05/2022 Name:  Ryan Pittman MRN:  [id number] DOB:  [birth year]  Summary: CCM F/U visit -Reviewed medications; pt affirms compliance with current list -Discussed importance of daily PPI (pantoprazole) given hx of GI bleed, possible chronic GI bleeding at this point  Recommendations/Changes made from today's visit: -No med changes; keep lab appt 7/31   Plan: -[place] will call patient 3 months for adherence review -Pharmacist follow up televisit scheduled for 6 months   Subjective: Ryan Pittman is an 81 y.o. year old male who is a primary patient of Damita Dunnings, Elveria Rising, MD.  The CCM team was consulted for assistance with disease management and care coordination needs.    Engaged with patient by telephone for follow up visit in response to provider referral for pharmacy case management and/or care coordination services.   Consent to Services:  The patient was given information about Chronic Care Management services, agreed to services, and gave verbal consent prior to initiation of services.  Please see initial visit note for detailed documentation.   Patient Care Team: Tonia Ghent, MD as PCP - General (Family Medicine) [name], MD as PCP - Cardiology (Cardiology) Lorelee Cover., MD as Consulting Physician (Ophthalmology) Ronda Fairly. Elenor Quinones, MD as Consulting Physician (Urology) Macario Carls, [place], CCC-A as Consulting Physician (Audiology) Marilynn Rail, Jossie Ng, NP as Nurse Practitioner (Cardiology) Charlton Haws, [facility] as Pharmacist (Pharmacist) Efrain Sella, MD as Consulting Physician (Gastroenterology) Earlie Server, MD as Consulting Physician (Oncology) Ottie Glazier, MD as Consulting Physician (Pulmonary Disease)  Patient lives at home with his wife.  Recent office visits: 02/13/22 Dr Damita Dunnings OV: f/u - d/c pramipexole (no change in RLS). Sent iFO[employer] kids for home testing of stool  monthly.  09/09/21-PCP-Graham Duncan,MD-Patient presented for follow up hypertension. "Continue 12.21m (1/2 tab) of metoprolol twice a day.  If you have heart racing above 100, you can take an extra 1/2 tab of metoprolol and call cardiology for help.  Hold septra while taking doxycyline.  Start doxycycline and let me know if the cough doesn't get better" Continue Plavix and hold Eliquis for now.  Recent consult visits: 02/27/22 PA SLeontine Locket(Vascular): PAD - s/p SFA 10/25/21. ABI unchanged.   01/25/22 PA MAlmyra Deforest(Cardioogy): Afib - Start Eliquis 2.5 mg BID (had stopped Eliquis March 2023 due to progressive anemia.   09/23/21 Dr BTrula Slade(Vasc surg): Eval renal artery stenosis. Rec angiography for kidneys and arteriogram for R leg 10/25/21.. Will need to hold Eliquis.   09/19/21-Nephrology-Munsoor Lateef,MD-Patient presented for follow CKD.Labs ordered(abnormal)no medication changes   09/16/21-Cardiology-Jonathan Berry,MD-Patient presented for cardiovascular evaluation due to chest pain.Discussed EKG,doppler study, labs,referral for lexiscan,stop plavix, start Eliquis 2.581mand low dose aspirin .Follow up 6 months   09/05/21-Nephrology-Munsoor Lateef,MD- Patient presented for follow up CKD. Increase amlodipine to 1022m tablet daily, labs ordered.  08/26/21-Cardiology-Kathryn Lawrence,NP-Patient presented for follow up heart disease.EKG, take home BP readings and bring to next appt. Start omeprazole 20 mg daily, apply zio patch monitor   08/18/21-Vascular Surgery- EmmMadelon Lipsesented for follow up PAD. Doppler studies,no medication changes.  [facility]: [facility] Eli[facility]/aspirin. Endoscopy showed multiple AVMs, no active bleeding. GI favored resuming aspirin/Eliquis. IV iron given before d/c   Objective:  Lab Results  Component Value Date   CREATININE 1.29 (H) 12/07/2021   BUN 25 (H) 12/07/2021   GFR  50.16 (L) 12/02/2021  EGFR 42 (L) 09/14/2021   NA 134 (L) 12/07/2021   K 4.5 12/07/2021   CALCIUM 9.0 12/07/2021   CO2 24 12/07/2021   GLUCOSE 93 12/07/2021    Lab Results  Component Value Date/Time   HGBA1C 6.1 (H) 07/14/2019 08:30 AM   HGBA1C 5.7 (H) 07/10/2018 11:41 AM   GFR 50.16 (L) 12/02/2021 09:17 AM   GFR 33.67 (L) 11/25/2021 01:47 PM    Last diabetic Eye exam: No results found for: "HMDIABEYEEXA"  Last diabetic Foot exam: No results found for: "HMDIABFOOTEX"   Lab Results  Component Value Date   CHOL 145 08/05/2021   HDL 78 08/05/2021   LDLCALC 53 08/05/2021   LDLDIRECT 41.0 12/30/2020   TRIG 72 08/05/2021   CHOLHDL 1.9 08/05/2021       Latest Ref Rng & Units 12/07/2021   12:12 PM 11/22/2021    5:53 PM 11/07/2021    1:20 PM  Hepatic Function  Total Protein 6.5 - 8.1 g/dL 6.7  6.2  6.2   Albumin 3.5 - 5.0 g/dL 4.2  3.8  4.1   AST 15 - 41 U/L '18  16  13   ' ALT 0 - 44 U/L '14  14  7   ' Alk Phosphatase 38 - 126 U/L 41  36  38   Total Bilirubin 0.3 - 1.2 mg/dL 0.1  0.4  0.4     Lab Results  Component Value Date/Time   TSH 2.246 08/25/2021 03:17 AM   TSH 1.42 12/30/2020 11:28 AM   TSH 4.04 04/08/2020 08:58 AM       Latest Ref Rng & Units 04/03/2022    9:27 AM 03/22/2022    8:56 AM 02/06/2022   10:49 AM  CBC  WBC 4.0 - 10.5 K/uL 10.0  10.7  12.2   Hemoglobin 13.0 - 17.0 g/dL 13.2  12.8  12.1   Hematocrit 39.0 - 52.0 % 39.8  38.8  37.0   Platelets 150.0 - 400.0 K/uL 178.0  218.0  174    Iron/TIBC/Ferritin/ %Sat    Component Value Date/Time   IRON 79 04/03/2022 0927   TIBC 375 02/06/2022 1049   FERRITIN 113 02/06/2022 1049   IRONPCTSAT 25 02/06/2022 1049   No results found for: "VD25OH"  Clinical ASCVD: Yes  The ASCVD Risk score (Arnett DK, et al., 2019) failed to calculate for the following reasons:   The 2019 ASCVD risk score is only valid for ages 80 to 49       12/22/2021    3:32 PM 12/21/2020   12:02 PM 04/25/2019   12:15 PM  Depression screen  PHQ 2/9  Decreased Interest 0 0 0  Down, Depressed, Hopeless 0 0 0  PHQ - 2 Score 0 0 0  Altered sleeping  0   Tired, decreased energy  0   Change in appetite  0   Feeling bad or failure about yourself   0   Trouble concentrating  0   Moving slowly or fidgety/restless  0   Suicidal thoughts  0   PHQ-9 Score  0   Difficult doing work/chores  Not difficult at all      CHA2DS2/VAS Stroke Risk Points  Current as of 2 days ago (Monday)     4 >= 2 Points: High Risk  1 - 1.99 Points: Medium Risk  0 Points: Low Risk    Last Change: N/A      Points Metrics  0 Has Congestive Heart Failure:  No  Current as of 2 days ago (Monday)  1 Has Vascular Disease:  Yes    Current as of 2 days ago (Monday)  1 Has Hypertension:  Yes    Current as of 2 days ago (Monday)  2 Age:  51    Current as of 2 days ago (Monday)  0 Has Diabetes:  No    Current as of 2 days ago (Monday)  0 Had Stroke:  No  Had TIA:  No  Had Thromboembolism:  No    Current as of 2 days ago (Monday)  0 Male:  No    Current as of 2 days ago (Monday)    Social History   Tobacco Use  Smoking Status Former   Types: Cigarettes   Quit date: 09/04/2008   Years since quitting: 13.5  Smokeless Tobacco Former   Types: Chew  Tobacco Comments   HAs quit tobacco products 2009   BP Readings from Last 3 Encounters:  02/27/22 (!) 155/62  02/13/22 (!) 142/70  02/08/22 (!) 157/51   Pulse Readings from Last 3 Encounters:  02/27/22 62  02/13/22 60  02/08/22 68   Wt Readings from Last 3 Encounters:  02/27/22 156 lb (70.8 kg)  02/13/22 159 lb (72.1 kg)  02/08/22 159 lb (72.1 kg)   BMI Readings from Last 3 Encounters:  02/27/22 24.43 kg/m  02/13/22 24.90 kg/m  02/08/22 24.90 kg/m    Assessment/Interventions: Review of patient past medical history, allergies, medications, health status, including review of consultants reports, laboratory and other test data, was performed as part of comprehensive evaluation and  provision of chronic care management services.   SDOH:  (Social Determinants of Health) assessments and interventions performed: No - done April 2023   SDOH Screenings   Alcohol Screen: Low Risk  (12/22/2021)   Alcohol Screen    Last Alcohol Screening Score (AUDIT): 1  Depression (PHQ2-9): Low Risk  (12/22/2021)   Depression (PHQ2-9)    PHQ-2 Score: 0  Financial Resource Strain: Low Risk  (12/22/2021)   Overall Financial Resource Strain (CARDIA)    Difficulty of Paying Living Expenses: Not hard at all  Food Insecurity: No Food Insecurity (12/22/2021)   Hunger Vital Sign    Worried About Running Out of Food in the Last Year: Never true    Ran Out of Food in the Last Year: Never true  Housing: Low Risk  (12/22/2021)   Housing    Last Housing Risk Score: 0  Physical Activity: Insufficiently Active (12/22/2021)   Exercise Vital Sign    Days of Exercise per Week: 3 days    Minutes of Exercise per Session: 30 min  Social Connections: Moderately Integrated (12/22/2021)   Social Connection and Isolation Panel [NHANES]    Frequency of Communication with Friends and Family: More than three times a week    Frequency of Social Gatherings with Friends and Family: More than three times a week    Attends Religious Services: More than 4 times per year    Active Member of Clubs or Organizations: No    Attends Archivist Meetings: Never    Marital Status: Married  Stress: No Stress Concern Present (12/22/2021)   [place]    Feeling of Stress : Not at all  Tobacco Use: Medium Risk (02/27/2022)   Patient History    Smoking Tobacco Use: Former    Smokeless Tobacco Use: Former    Passive Exposure: Not on Pensions consultant  Needs: No Transportation Needs (12/22/2021)   PRAPARE - Hydrologist (Medical): No    Lack of Transportation (Non-Medical): No    CCM Care Plan  Allergies  Allergen  Reactions   Penicillins Itching and Other (See Comments)    PATIENT HAS HAD A PCN REACTION WITH IMMEDIATE RASH, FACIAL/TONGUE/THROAT SWELLING, SOB, OR LIGHTHEADEDNESS WITH HYPOTENSION:  #  #  YES  #  #  Has patient had a PCN reaction causing severe rash involving mucus membranes or skin necrosis: No Has patient had a PCN reaction that required hospitalization: No Has patient had a PCN reaction occurring within the last 10 years: No If all of the above answers are "NO", then may proceed with Cephalosporin use.    Ace Inhibitors Cough   Aspirin Other (See Comments)    H/o GI bleed   Celebrex [Celecoxib] Other (See Comments)    GI bleed   Lipitor [Atorvastatin] Other (See Comments)    myalgias    Medications Reviewed Today     Reviewed by Charlton Haws, [facility] (Pharmacist) on 03/29/22 at 1134  Med List Status: <None>   Medication Order Taking? Sig Documenting Provider Last Dose Status Informant  acetaminophen (TYLENOL) 500 MG tablet [id number] Yes Take 1,000 mg by mouth every 6 (six) hours as needed for moderate pain or headache. [provider] Taking Active Self  apixaban (ELIQUIS) 2.5 MG TABS tablet [id number] Yes Take 1 tablet (2.5 mg total) by mouth 2 (two) times daily. Almyra Deforest, [place] Taking Active   Ascorbic Acid (VITAMIN C) 1000 MG tablet [id number] Yes Take 1,000 mg by mouth daily. [provider] Taking Active Self  Cholecalciferol (DIALYVITE VITAMIN D 5000 PO) [id number] Yes Take 5,000 Units by mouth daily. [provider] Taking Active   ferrous sulfate 325 (65 FE) MG EC tablet [id number] Yes Take 1 tablet (325 mg total) by mouth daily. Earlie Server, MD Taking Active   hydrOXYzine (ATARAX) 10 MG tablet [id number] Yes ONE-HALF TO ONE TABLET 3 TIMES DAILY AS NEEDED FOR ANXIETY Tonia Ghent, MD Taking Active   levothyroxine (SYNTHROID) 75 MCG tablet [id number] Yes TAKE 1 TABLET BY MOUTH DAILY EXCEPT ON THURSDAYS TAKE 1.5 TABLETS Tonia Ghent, MD Taking  Active Self  losartan (COZAAR) 25 MG tablet [id number] Yes Take 1 tablet by mouth in the morning and at bedtime. [provider] Taking Active   metoprolol tartrate (LOPRESSOR) 25 MG tablet [id number] Yes Take 1 tablet (25 mg total) by mouth 2 (two) times daily. Almyra Deforest, [place] Taking Active   Multiple Vitamins-Minerals (MULTIVITAMIN MEN 50+) TABS [id number] Yes Take 1 tablet by mouth daily. [provider] Taking Active Self  pantoprazole (PROTONIX) 40 MG tablet [id number] Yes Take 1 tablet (40 mg total) by mouth daily. Almyra Deforest, [place] Taking Active   REPATHA SURECLICK 527 MG/ML Darden Palmer [id number] Yes INJECT 140MG EVERY 14 DAYS [name], MD Taking Active Self  tamsulosin [facility]) 0.4 MG CAPS capsule [id number] Yes Take 1 capsule (0.4 mg total) by mouth daily. 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   Acute respiratory failure with hypoxia and hypercapnia (HCC)    Idiopathic hypercalcemia 08/03/2019   Normocytic anemia 07/09/2019   Ankle pain 07/02/2019   CAD (coronary artery disease) 08/08/2018   PAF (paroxysmal atrial fibrillation) ([place]) 08/06/2018   Hx of CABG 07/11/2018   Health care maintenance 04/04/2018   Atypical chest pain 04/04/2018   Hypothyroidism 03/30/2017   Advance care planning 03/30/2017   Cough 09/27/2016   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   Renal artery stenosis (HCC) 07/04/2016   BCC (basal cell carcinoma), 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   Hyperglycemia 12/28/2015   Hyperlipidemia 12/28/2015   Hypothyroidism, 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    Thyroid cancer ([place]) 11/12/2014   Shortness of breath 04/22/2014   Spasm 04/22/2014   RLS (restless legs syndrome) 11/16/2013   Insomnia 06/18/2013   Medicare annual wellness visit, subsequent 03/12/2012   HTN (hypertension) 08/14/2011   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   CKD (chronic kidney disease) stage 3, GFR 30-59 ml/min (HCC) 02/13/2011   OA (osteoarthritis) 02/13/2011   Carotid stenosis 02/13/2011   ED (erectile dysfunction) 02/13/2011   Duodenal ulcer 02/13/2011   Diverticulosis 02/13/2011   PVD (peripheral vascular disease) ([place]) 01/26/2011   S/P bypass graft of extremity 01/26/2011   Essential hypertension, benign 01/26/2011   Back pain 01/26/2011   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   Nocturia 05/12/2008   Urinary hesitancy 05/12/2008    Immunization History  Administered Date(s) Administered   Influenza, High Dose Seasonal PF 05/19/2016, 05/19/2019, 06/01/2020   Influenza-Unspecified 06/18/2014, 07/02/2017, 06/12/2018   Moderna SARS-COV2 Booster Vaccination 08/03/2020   Moderna Sars-Covid-2 Vaccination 10/18/2019, 11/15/2019   Pneumococcal Conjugate-13 04/07/2014   Pneumococcal Polysaccharide-23 04/28/2010   Td 03/12/2012   Zoster, Live 06/07/2010    Conditions to be addressed/monitored:  Hypertension, Hyperlipidemia, Atrial Fibrillation, Coronary Artery Disease, Chronic Kidney Disease, Hypothyroidism, and BPH, PAD, Hx GI bleed  Care Plan : CCM Pharmacy Care Plan  Updates made by Charlton Haws, [place] since 04/05/2022 12:00 AM     Problem: Hypertension, Hyperlipidemia, Atrial Fibrillation, Coronary Artery Disease, Chronic Kidney Disease, Hypothyroidism, and BPH, PAD, Hx GI bleed   Priority: High     Long-Range Goal: Disease mgmt   Start Date: 09/28/2021  Expected End Date: 09/28/2022  This Visit's Progress: On track  Recent Progress: On track  Priority: High  Note:   Current Barriers:  None identified  Pharmacist Clinical Goal(s):   Patient will contact provider office for questions/concerns as evidenced notation of same in electronic health record through collaboration with PharmD and provider.   Interventions: 1:1 collaboration with Tonia Ghent, MD regarding development and update of comprehensive plan of care as evidenced by provider attestation and co-signature Inter-disciplinary care team collaboration (see longitudinal plan of care) Comprehensive medication review performed; medication list updated in electronic medical record  Hypertension / CKD Stage 3a (BP goal <140/90) -Controlled - home BP is at goal per pt report after recent med changes; he has also cut out pork/country ham; Denies hypotensive/hypertensive symptoms -Current home readings: 130/60 -Current treatment: Losartan 100 mg daily -Appropriate, Effective, Safe, Accessible Metoprolol tartrate 25 mg - 1 tab BID -Appropriate, Effective, Safe, Accessible -Medications previously tried: amlodipine (low BP -Educated on BP goals and benefits of medications for prevention of heart attack, stroke and kidney damage; Daily salt intake goal < 2300 mg; Importance of home blood pressure monitoring; -Counseled to monitor BP at home daily -Recommended to continue current medication  Hyperlipidemia: (LDL goal < 70) -Controlled -  LDL 53 (08/2021) at goal; pt endorses compliance with current regimen; he reports Repatha is currently affordable; hx statin intolerance -Hx CAD (CABGx 5 2019), PAD s/p multiple stents. Not on aspirin anymore. -Follows with cardiology, vascular regularly -Current treatment: Repatha Sureclick 001 mg V49 days -Appropriate, Effective, Safe, Accessible -Medications previously tried: aspirin (GI bleed), atorvastatin, clopidogrel -Educated on Cholesterol goals;  benefits of aspirin given multiple stents and hx CABG -Recommended to continue current medication  Atrial Fibrillation (Goal: prevent stroke and major bleeding) -Controlled   -CHADSVASC: 4; hx multiple GI bleed (most recent 10/2021); per cardiology 01/2022 decision was made to continue with anticoagulation given benefits > risk -Current treatment: Metoprolol tartrate 25 mg BID -Appropriate, Effective, Safe, Accessible Eliquis 2.5 mg BID - Appropriate, Effective, Query Safe, Accessible -Medications previously tried: amiodarone, digoxin -Counseled on increased risk of stroke due to Afib and benefits of anticoagulation for stroke prevention; importance of adherence to anticoagulant exactly as prescribed; bleeding risk associated with Eliquis and importance of self-monitoring for signs/symptoms of bleeding; avoidance of NSAIDs due to increased bleeding risk with anticoagulants; seeking medical attention after a head injury or if there is blood in the urine/stool; -Recommended to continue current medication  Hx GI Bleed / Diverticulosis (Goal: prevent recurrence) -Controlled - pt is not always compliant with pantoprazole -GI Bleed 10/2021 on Eliquis/aspirin, decision made per cardiology to continue anticoagulation -Current treatment  Pantoprazole 40 mg daily - Appropriate, Effective, Safe, Accessible -Medications previously tried: omeprazole -Discussed benefits of PPI given hx of GI bleed and current Eliquis; advised to continue daily PPI  Hypothyroidism (Goal: maintain TSH in goal range) -Controlled - TSH is at goal; pt endorses compliance with current regimen; he has seen endocrinology previously for thyroid issues but has not been back in over a year (last OV 06/2020), he has about 5 days remaining on current prescription and has not been able to get a refill -Current treatment  Levothyroxine 75 mcg daily except 1.5 tab once a week -Appropriate, Effective, Safe, Accessible -Advised pt to contact endocrine office for refill, he may need a f/u appt -Recommended to continue current medication  Health Maintenance -Vaccine gaps: Flu, Shingrix, covid booster -Hx of  Thyroid cancer, basal cell carcinoma  Patient Goals/Self-Care Activities Patient will:  - take medications as prescribed as evidenced by patient report and record review focus on medication adherence by pill box check blood pressure daily, document, and provide at future appointments engage in dietary modifications by limiting salty foods       Medication Assistance: None required.  Patient affirms current coverage meets needs. - may need assistance for Eliquis and/or Repatha during donut hole.  Compliance/Adherence/Medication fill history: Care Gaps: Colonoscopy (due 05/03/18)  Star-Rating Drugs: Losartan - PDC 79%; LF 03/04/22 x 30 ds  Medication Access: Within the past 30 days, how often has patient missed a dose of medication? 0 Is a pillbox or other method used to improve adherence? Yes  Factors that may affect medication adherence? no barriers identified Are meds synced by current pharmacy? No  Are meds delivered by current pharmacy? No  Does patient experience delays in picking up medications due to transportation concerns? No   [facility] Services Reviewed: Is patient disadvantaged to use [facility]?: Yes  Current Rx insurance plan: [place] Name and location of Current pharmacy:  [place], [place] - [address] [address] Phone: [phone] Fax: [phone]  [facility] services reviewed with patient today?: No  Patient  requests to transfer care to [facility]?: No  Reason patient declined to change pharmacies: Loyalty to other pharmacy/Patient preference   Care Plan and Follow Up Patient Decision:  Patient agrees to Care Plan and Follow-up.  Plan: Telephone follow up appointment with care management team member scheduled for:  6 months  [name], PharmD, BCACP Clinical Pharmacist [facility] [phone]

## 2022-04-03 ENCOUNTER — Other Ambulatory Visit (INDEPENDENT_AMBULATORY_CARE_PROVIDER_SITE_OTHER): Payer: Medicare Other

## 2022-04-03 DIAGNOSIS — D649 Anemia, unspecified: Secondary | ICD-10-CM

## 2022-04-03 LAB — CBC WITH DIFFERENTIAL/PLATELET
Basophils Absolute: 0.1 10*3/uL (ref 0.0–0.1)
Basophils Relative: 1.3 % (ref 0.0–3.0)
Eosinophils Absolute: 0.3 10*3/uL (ref 0.0–0.7)
Eosinophils Relative: 2.7 % (ref 0.0–5.0)
HCT: 39.8 % (ref 39.0–52.0)
Hemoglobin: 13.2 g/dL (ref 13.0–17.0)
Lymphocytes Relative: 11.7 % — ABNORMAL LOW (ref 12.0–46.0)
Lymphs Abs: 1.2 10*3/uL (ref 0.7–4.0)
MCHC: 33.1 g/dL (ref 30.0–36.0)
MCV: 91.6 fl (ref 78.0–100.0)
Monocytes Absolute: 0.6 10*3/uL (ref 0.1–1.0)
Monocytes Relative: 6.5 % (ref 3.0–12.0)
Neutro Abs: 7.8 10*3/uL — ABNORMAL HIGH (ref 1.4–7.7)
Neutrophils Relative %: 77.8 % — ABNORMAL HIGH (ref 43.0–77.0)
Platelets: 178 10*3/uL (ref 150.0–400.0)
RBC: 4.35 Mil/uL (ref 4.22–5.81)
RDW: 19.2 % — ABNORMAL HIGH (ref 11.5–15.5)
WBC: 10 10*3/uL (ref 4.0–10.5)

## 2022-04-03 LAB — IRON: Iron: 79 ug/dL (ref 42–165)

## 2022-04-05 NOTE — Patient Instructions (Signed)
Visit Information  Phone number for Pharmacist: [phone]   Goals Addressed   None     Care Plan : [place]  Updates made by Charlton Haws, RPH since 04/05/2022 12:00 AM     Problem: Hypertension, Hyperlipidemia, Atrial Fibrillation, Coronary Artery Disease, Chronic Kidney Disease, Hypothyroidism, and BPH, PAD, Hx GI bleed   Priority: High     Long-Range Goal: Disease mgmt   Start Date: 09/28/2021  Expected End Date: 09/28/2022  This Visit's Progress: On track  Recent Progress: On track  Priority: High  Note:   Current Barriers:  None identified  Pharmacist Clinical Goal(s):  Patient will contact provider office for questions/concerns as evidenced notation of same in electronic health record through collaboration with PharmD and provider.   Interventions: 1:1 collaboration with Tonia Ghent, MD regarding development and update of comprehensive plan of care as evidenced by provider attestation and co-signature Inter-disciplinary care team collaboration (see longitudinal plan of care) Comprehensive medication review performed; medication list updated in electronic medical record  Hypertension / CKD Stage 3a (BP goal <140/90) -Controlled - home BP is at goal per pt report after recent med changes; he has also cut out pork/country ham; Denies hypotensive/hypertensive symptoms -Current home readings: 130/60 -Current treatment: Losartan 100 mg daily -Appropriate, Effective, Safe, Accessible Metoprolol tartrate 25 mg - 1 tab BID -Appropriate, Effective, Safe, Accessible -Medications previously tried: amlodipine (low BP -Educated on BP goals and benefits of medications for prevention of heart attack, stroke and kidney damage; Daily salt intake goal < 2300 mg; Importance of home blood pressure monitoring; -Counseled to monitor BP at home daily -Recommended to continue current medication  Hyperlipidemia: (LDL goal < 70) -Controlled - LDL 53 (08/2021) at  goal; pt endorses compliance with current regimen; he reports Repatha is currently affordable; hx statin intolerance -Hx CAD (CABGx 5 2019), PAD s/p multiple stents. Not on aspirin anymore. -Follows with cardiology, vascular regularly -Current treatment: Repatha Sureclick 630 mg Z60 days -Appropriate, Effective, Safe, Accessible -Medications previously tried: aspirin (GI bleed), atorvastatin, clopidogrel -Educated on Cholesterol goals;  benefits of aspirin given multiple stents and hx CABG -Recommended to continue current medication  Atrial Fibrillation (Goal: prevent stroke and major bleeding) -Controlled  -CHADSVASC: 4; hx multiple GI bleed (most recent 10/2021); per cardiology 01/2022 decision was made to continue with anticoagulation given benefits > risk -Current treatment: Metoprolol tartrate 25 mg BID -Appropriate, Effective, Safe, Accessible Eliquis 2.5 mg BID - Appropriate, Effective, Query Safe, Accessible -Medications previously tried: amiodarone, digoxin -Counseled on increased risk of stroke due to Afib and benefits of anticoagulation for stroke prevention; importance of adherence to anticoagulant exactly as prescribed; bleeding risk associated with Eliquis and importance of self-monitoring for signs/symptoms of bleeding; avoidance of NSAIDs due to increased bleeding risk with anticoagulants; seeking medical attention after a head injury or if there is blood in the urine/stool; -Recommended to continue current medication  Hx GI Bleed / Diverticulosis (Goal: prevent recurrence) -Controlled - pt is not always compliant with pantoprazole -GI Bleed 10/2021 on Eliquis/aspirin, decision made per cardiology to continue anticoagulation -Current treatment  Pantoprazole 40 mg daily - Appropriate, Effective, Safe, Accessible -Medications previously tried: omeprazole -Discussed benefits of PPI given hx of GI bleed and current Eliquis; advised to continue daily PPI  Hypothyroidism (Goal:  maintain TSH in goal range) -Controlled - TSH is at goal; pt endorses compliance with current regimen; he has seen endocrinology previously for thyroid issues but has not been back in over a year (last  OV 06/2020), he has about 5 days remaining on current prescription and has not been able to get a refill -Current treatment  Levothyroxine 75 mcg daily except 1.5 tab once a week -Appropriate, Effective, Safe, Accessible -Advised pt to contact endocrine office for refill, he may need a f/u appt -Recommended to continue current medication  Health Maintenance -Vaccine gaps: Flu, Shingrix, covid booster -Hx of Thyroid cancer, basal cell carcinoma  Patient Goals/Self-Care Activities Patient will:  - take medications as prescribed as evidenced by patient report and record review focus on medication adherence by pill box check blood pressure daily, document, and provide at future appointments engage in dietary modifications by limiting salty foods       Patient verbalizes understanding of instructions and care plan provided today and agrees to view in [place]. Active MyChart status and patient understanding of how to access instructions and care plan via MyChart confirmed with patient.    Telephone follow up appointment with pharmacy team member scheduled for: 6 months  [name], PharmD, [facility] Clinical Pharmacist [facility] [phone]

## 2022-04-07 ENCOUNTER — Other Ambulatory Visit: Payer: Self-pay | Admitting: Family Medicine

## 2022-04-07 DIAGNOSIS — D649 Anemia, unspecified: Secondary | ICD-10-CM

## 2022-04-11 DIAGNOSIS — D649 Anemia, unspecified: Secondary | ICD-10-CM | POA: Diagnosis not present

## 2022-04-11 DIAGNOSIS — D472 Monoclonal gammopathy: Secondary | ICD-10-CM | POA: Diagnosis not present

## 2022-04-24 ENCOUNTER — Other Ambulatory Visit (INDEPENDENT_AMBULATORY_CARE_PROVIDER_SITE_OTHER): Payer: Medicare Other

## 2022-04-24 DIAGNOSIS — D649 Anemia, unspecified: Secondary | ICD-10-CM | POA: Diagnosis not present

## 2022-04-26 ENCOUNTER — Telehealth: Payer: Self-pay | Admitting: Radiology

## 2022-04-26 ENCOUNTER — Other Ambulatory Visit (INDEPENDENT_AMBULATORY_CARE_PROVIDER_SITE_OTHER): Payer: Medicare Other

## 2022-04-26 DIAGNOSIS — D649 Anemia, unspecified: Secondary | ICD-10-CM | POA: Diagnosis not present

## 2022-04-26 LAB — FECAL OCCULT BLOOD, IMMUNOCHEMICAL: Fecal Occult Bld: POSITIVE — AB

## 2022-04-26 NOTE — Telephone Encounter (Signed)
[facility] called a Positive ifob, results sent to Dr Damita Dunnings and given on site to Dr Danise Mina

## 2022-04-26 NOTE — Telephone Encounter (Signed)
Noted. Thanks.

## 2022-04-26 NOTE — Telephone Encounter (Signed)
Confirms previous positive iFOB last month. This can await PCP.

## 2022-04-26 NOTE — Telephone Encounter (Signed)
Spoke with patient about results. Patient states he has been feeling fine; no sx of any kind. Scheduled patient to come in tomorrow for lab work at 8:05 am before he goes out of town.

## 2022-04-26 NOTE — Telephone Encounter (Signed)
Please check with patient.  FOBT positive.  I suspect this is the same issue as prior.  Please see how he if feeling.  If black stools or lightheaded, then needs eval. I want to recheck his labs in the meantime.  Please schedule lab visit for CBC and iron.  The orders are in. As long as his hemoglobin and iron remain stable, it may be okay to continue with Eliquis as is.  Thanks.

## 2022-04-27 ENCOUNTER — Ambulatory Visit: Payer: Self-pay

## 2022-04-27 LAB — CBC WITH DIFFERENTIAL/PLATELET
Basophils Absolute: 0.1 10*3/uL (ref 0.0–0.1)
Basophils Relative: 1 % (ref 0.0–3.0)
Eosinophils Absolute: 0.3 10*3/uL (ref 0.0–0.7)
Eosinophils Relative: 2 % (ref 0.0–5.0)
HCT: 38.7 % — ABNORMAL LOW (ref 39.0–52.0)
Hemoglobin: 13 g/dL (ref 13.0–17.0)
Lymphocytes Relative: 13.7 % (ref 12.0–46.0)
Lymphs Abs: 1.7 10*3/uL (ref 0.7–4.0)
MCHC: 33.6 g/dL (ref 30.0–36.0)
MCV: 93 fl (ref 78.0–100.0)
Monocytes Absolute: 0.9 10*3/uL (ref 0.1–1.0)
Monocytes Relative: 7.4 % (ref 3.0–12.0)
Neutro Abs: 9.6 10*3/uL — ABNORMAL HIGH (ref 1.4–7.7)
Neutrophils Relative %: 75.9 % (ref 43.0–77.0)
Platelets: 170 10*3/uL (ref 150.0–400.0)
RBC: 4.16 Mil/uL — ABNORMAL LOW (ref 4.22–5.81)
RDW: 16.6 % — ABNORMAL HIGH (ref 11.5–15.5)
WBC: 12.7 10*3/uL — ABNORMAL HIGH (ref 4.0–10.5)

## 2022-04-27 LAB — IRON: Iron: 124 ug/dL (ref 42–165)

## 2022-04-27 NOTE — Patient Instructions (Signed)
Visit Information  Thank you for taking time to visit with me today. Please don't hesitate to contact me if I can be of assistance to you.   Following are the goals we discussed today:   Goals Addressed             This Visit's Progress    Patient stated:  Health maintenance       Care Coordination Interventions: Discussed importance of annual wellness visit.  Reviewed provider follow up appointments          Our next appointment is by telephone on 05/17/22 at 10:00 am  Please call the care guide team at [phone] if you need to cancel or reschedule your appointment.   If you are experiencing a Mental Health or [place] or need someone to talk to, please call the Suicide and Crisis Lifeline: 988 call 1-800-273-TALK (toll free, 24 hour hotline)  Patient verbalizes understanding of instructions and care plan provided today and agrees to view in [place]. Active MyChart status and patient understanding of how to access instructions and care plan via MyChart confirmed with patient.     Quinn Plowman RN,BSN,CCM RN Care Manager Coordinator [phone]

## 2022-04-27 NOTE — Patient Outreach (Signed)
  Care Coordination   Initial Visit Note   04/27/2022 Name: Ryan Pittman MRN: [id number] DOB: 1941/05/30  Ryan Pittman is a 81 y.o. year old male who sees Tonia Ghent, MD for primary care. I spoke with  Ryan Pittman by phone today  What matters to the patients health and wellness today?  Patient states he would consider care coordination services and agreed to follow up call with RNCM in approximately 2 weeks.     Goals Addressed             This Visit's Progress    Patient stated:  Health maintenance       Care Coordination Interventions: Discussed importance of annual wellness visit.  Reviewed provider follow up appointments          SDOH assessments and interventions completed:  No     Care Coordination Interventions Activated:  Yes  Care Coordination Interventions:  Yes, provided   Follow up plan: Follow up call scheduled for 05/17/22 at 10:00 am    Encounter Outcome:  Pt. Scheduled   Quinn Plowman RN,BSN,CCM [place] Coordinator [phone]

## 2022-04-30 ENCOUNTER — Other Ambulatory Visit: Payer: Self-pay | Admitting: Family Medicine

## 2022-04-30 DIAGNOSIS — D649 Anemia, unspecified: Secondary | ICD-10-CM

## 2022-05-01 NOTE — Addendum Note (Signed)
Addended by: Ellamae Sia on: 05/01/2022 07:27 AM   Modules accepted: Orders

## 2022-05-04 ENCOUNTER — Telehealth: Payer: Self-pay | Admitting: Oncology

## 2022-05-04 NOTE — Telephone Encounter (Signed)
Patient's wife called to cancel his 3 month labs/MD/Venofer appointment next week. She stated that the patient's PCP is monitoring his blood work (which he had recently) and that returning to the cancer center is not needed at this time.

## 2022-05-09 ENCOUNTER — Encounter: Payer: Self-pay | Admitting: Physician Assistant

## 2022-05-09 ENCOUNTER — Inpatient Hospital Stay: Payer: Medicare Other

## 2022-05-09 ENCOUNTER — Other Ambulatory Visit
Admission: RE | Admit: 2022-05-09 | Discharge: 2022-05-09 | Disposition: A | Discharge status: Home / Self Care | Payer: Medicare Other | Attending: Physician Assistant | Admitting: Physician Assistant

## 2022-05-09 ENCOUNTER — Ambulatory Visit: Payer: Medicare Other | Attending: Physician Assistant | Admitting: Physician Assistant

## 2022-05-09 VITALS — BP 160/58 | HR 63 | Ht 66.0 in | Wt 159.2 lb

## 2022-05-09 DIAGNOSIS — D62 Acute posthemorrhagic anemia: Secondary | ICD-10-CM | POA: Diagnosis not present

## 2022-05-09 DIAGNOSIS — I480 Paroxysmal atrial fibrillation: Secondary | ICD-10-CM | POA: Diagnosis not present

## 2022-05-09 DIAGNOSIS — I25118 Atherosclerotic heart disease of native coronary artery with other forms of angina pectoris: Secondary | ICD-10-CM | POA: Diagnosis not present

## 2022-05-09 DIAGNOSIS — I739 Peripheral vascular disease, unspecified: Secondary | ICD-10-CM | POA: Diagnosis not present

## 2022-05-09 DIAGNOSIS — D649 Anemia, unspecified: Secondary | ICD-10-CM | POA: Diagnosis not present

## 2022-05-09 DIAGNOSIS — Z951 Presence of aortocoronary bypass graft: Secondary | ICD-10-CM | POA: Insufficient documentation

## 2022-05-09 DIAGNOSIS — I701 Atherosclerosis of renal artery: Secondary | ICD-10-CM

## 2022-05-09 DIAGNOSIS — I10 Essential (primary) hypertension: Secondary | ICD-10-CM | POA: Diagnosis not present

## 2022-05-09 DIAGNOSIS — R809 Proteinuria, unspecified: Secondary | ICD-10-CM | POA: Diagnosis not present

## 2022-05-09 DIAGNOSIS — I2510 Atherosclerotic heart disease of native coronary artery without angina pectoris: Secondary | ICD-10-CM | POA: Diagnosis not present

## 2022-05-09 DIAGNOSIS — D631 Anemia in chronic kidney disease: Secondary | ICD-10-CM | POA: Diagnosis not present

## 2022-05-09 DIAGNOSIS — E785 Hyperlipidemia, unspecified: Secondary | ICD-10-CM | POA: Diagnosis not present

## 2022-05-09 DIAGNOSIS — N182 Chronic kidney disease, stage 2 (mild): Secondary | ICD-10-CM | POA: Diagnosis not present

## 2022-05-09 DIAGNOSIS — R195 Other fecal abnormalities: Secondary | ICD-10-CM | POA: Diagnosis not present

## 2022-05-09 DIAGNOSIS — Z8719 Personal history of other diseases of the digestive system: Secondary | ICD-10-CM | POA: Insufficient documentation

## 2022-05-09 LAB — BASIC METABOLIC PANEL
Anion gap: 9 (ref 5–15)
BUN: 23 mg/dL (ref 8–23)
CO2: 26 mmol/L (ref 22–32)
Calcium: 9.5 mg/dL (ref 8.9–10.3)
Chloride: 104 mmol/L (ref 98–111)
Creatinine, Ser: 1.18 mg/dL (ref 0.61–1.24)
GFR, Estimated: >60 mL/min (ref >60)
Glucose, Bld: 90 mg/dL (ref 70–99)
Potassium: 4.8 mmol/L (ref 3.5–5.1)
Sodium: 139 mmol/L (ref 135–145)

## 2022-05-09 LAB — CBC
HCT: 34.6 % — ABNORMAL LOW (ref 39.0–52.0)
Hemoglobin: 11.5 g/dL — ABNORMAL LOW (ref 13.0–17.0)
MCH: 31.3 pg (ref 26.0–34.0)
MCHC: 33.2 g/dL (ref 30.0–36.0)
MCV: 94 fL (ref 80.0–100.0)
Platelets: 219 10*3/uL (ref 150–400)
RBC: 3.68 MIL/uL — ABNORMAL LOW (ref 4.22–5.81)
RDW: 15.9 % — ABNORMAL HIGH (ref 11.5–15.5)
WBC: 12.8 10*3/uL — ABNORMAL HIGH (ref 4.0–10.5)
nRBC: 0 % (ref 0.0–0.2)

## 2022-05-09 NOTE — Patient Instructions (Signed)
Medication Instructions:  Your physician recommends that you continue on your current medications as directed. Please refer to the Current Medication list given to you today.  *If you need a refill on your cardiac medications before your next appointment, please call your pharmacy*   Lab Work: [place] Today over at the [facility]. Please stop at registration.   If you have labs (blood work) drawn today and your tests are completely normal, you will receive your results only by: [place] (if you have MyChart) OR A paper copy in the mail If you have any lab test that is abnormal or we need to change your treatment, we will call you to review the results.   Testing/Procedures: Referral has been sent over to the structural heart clinic and someone will reach out to you.    Follow-Up: At [facility], you and your health needs are our priority.  As part of our continuing mission to provide you with exceptional heart care, we have created designated Provider Care Teams.  These Care Teams include your primary Cardiologist (physician) and Advanced Practice Providers (APPs -  Physician Assistants and Nurse Practitioners) who all work together to provide you with the care you need, when you need it.   Your next appointment:   3 month(s)  The format for your next appointment:   In Person  Provider:   Christell Faith, PA-C        Important Information About [place]

## 2022-05-09 NOTE — Progress Notes (Signed)
Cardiology Office Note    Date:  05/09/2022   ID:  Ryan Pittman, DOB May 28, 1941, MRN [id number]  PCP:  Tonia Ghent, MD  Cardiologist:  [name], MD  Electrophysiologist:  None   Chief Complaint: Follow-up  History of Present Illness:   Ryan Pittman is a 81 y.o. male with history of CAD status post CABG, PAF, HTN, HLD, hypothyroidism, MGUS, PAD status post multiple interventions followed by vascular surgery, anemia with GI bleed in 10/2021, CKD stage III, COPD, HTN, and HLD who presents for follow-up of his CAD and A-fib.  He had a positive stress test and subsequent coronary CTA in 05/2018 that revealed a calcium score of 2858 with three-vessel coronary artery calcification.  He ultimately underwent LHC shortly after which revealed multivessel CAD with preserved LVEF.  He underwent 5 vessel CABG by Dr. Caffie Pinto in 07/2018.  Most recent ischemic evaluation via [place] MPI in 11/2020 showed no evidence of infarction or ischemia with an EF of 62%.  Overall, this was a low risk study.  He was diagnosed with A-fib with RVR in 08/2021 after presenting to [facility] with chest pain.  He was placed on apixaban.  Subsequent outpatient cardiac monitoring showed sinus rhythm with rare short episode of SVT, occasional PACs, and PVCs.  No further evidence of A-fib.  With regards to his PAD, lower extremity angiography in October 2020 revealed a 90% left renal artery stenosis, patent left femoral-popliteal bypass graft, patent right common iliac stent, 90% calcified right common femoral artery stenosis, 80% in-stent restenosis within the previously placed right SFA stent and 90% calcified right popliteal artery stenosis.  He ultimately underwent right femoral enterectomy and patch angioplasty by Dr. Trula Slade.  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  He  underwent overlapping stents of right SFA by Dr. Trula Slade on 10/25/2021.  No significant renal artery stenosis was identified at that time.  He subsequently had anemia and GI bleed leading Plavix to be discontinued and aspirin added. 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  It was felt nonbleeding vascular malformation could be a source of GI bleed.  Due to progressive anemia, Eliquis was suspended in 11/2021, and he has been evaluated by hematology.  He was last evaluated by Dr. Rockey Situ on 02/06/2022, at which time it was noted he was back on apixaban 2.5 mg twice daily, started approximately 1 week prior.  He was maintaining sinus rhythm with a mildly bradycardic rate.  He comes in accompanied by his wife today and is doing well from a cardiac perspective, without symptoms of angina or decompensation.  He does report an increase in fatigue that he attributes to the resumption of apixaban.  He denies any hematochezia, melena, hemoptysis, hematemesis, or hematuria.  However, fecal occult blood test was noted to be positive on 04/24/2022.  No palpitations, dizziness, presyncope, or syncope.  No significant lower extremity swelling or orthopnea.  No falls since he was last seen.   Labs independently reviewed: 04/2022 - Hgb 13.0, PLT 170, potassium 4.5, BUN 18, serum creatinine 1.1, albumin 3.9, AST/ALT not elevated 10/2021 - magnesium 1.8 08/2021 - TSH normal, TC 145, TG 72, HDL 78, LDL 53  Past Medical History:  Diagnosis Date   Anemia    due to GIB, s/p transfusion   Anginal pain ([place]) 07/10/2018   Arthritis    back pain, much worse after consecutive golf rounds   Cancer [facility])    thyroid   Chronic kidney disease    Colon polyps    Coronary artery disease    COVID-19    Dyspnea    walking up a hill   Dysrhythmia 2020   GERD  (gastroesophageal reflux disease)    Hypercalcemia    h/o, resolved as of 2012, prev due to high amount of calcium intake   Hyperlipidemia    Hypertension    Hypothyroidism    IgG gammopathy    stable as of 2012 per [facility]   Pneumonia    as a child   PVD (peripheral vascular disease) ([place])    L leg bypass, R leg stented    Past Surgical History:  Procedure Laterality Date    dental implant left bottom-did have bleeding      ABDOMINAL AORTOGRAM W/LOWER EXTREMITY Bilateral 06/26/2019   Procedure: ABDOMINAL AORTOGRAM W/LOWER EXTREMITY;  Surgeon: [name], MD;  Location: [place] CV LAB;  Service: Cardiovascular;  Laterality: Bilateral;   ABDOMINAL AORTOGRAM W/LOWER EXTREMITY N/A 06/08/2020   Procedure: ABDOMINAL AORTOGRAM W/LOWER EXTREMITY;  Surgeon: Serafina Mitchell, MD;  Location: [place] [facility] LAB;  Service: Cardiovascular;  Laterality: N/A;   BYPASS GRAFT     L leg   CARDIAC CATHETERIZATION  06/24/2018   COLONOSCOPY N/A 10/09/2021   Procedure: COLONOSCOPY;  Surgeon: Toledo, Benay Pike, MD;  Location: [facility] ENDOSCOPY;  Service: Gastroenterology;  Laterality: N/A;   COLONOSCOPY WITH PROPOFOL N/A 05/04/2015   Procedure: COLONOSCOPY WITH PROPOFOL;  Surgeon: Lollie Sails, MD;  Location: [facility] ENDOSCOPY;  Service: Endoscopy;  Laterality: N/A;   CORONARY ARTERY BYPASS GRAFT N/A 07/11/2018   Procedure: CORONARY ARTERY BYPASS GRAFTING (CABG) x three, using left internal mammary artery and right leg greater saphenous vein harvested endoscopically;  Surgeon: Gaye Pollack, MD;  Location: [place] OR;  Service: Open Heart Surgery;  Laterality: N/A;   ENDARTERECTOMY FEMORAL Right 02/13/2020   Procedure: RIGHT ENDARTERECTOMY FEMORAL WITH BOVINE PATCH;  Surgeon: Serafina Mitchell, MD;  Location: [facility] OR;  Service: Vascular;  Laterality: Right;   ESOPHAGOGASTRODUODENOSCOPY N/A 10/09/2021   Procedure: ESOPHAGOGASTRODUODENOSCOPY (EGD);  Surgeon: Toledo, Benay Pike, MD;  Location: [facility] ENDOSCOPY;   Service: Gastroenterology;  Laterality: N/A;   GIVENS CAPSULE STUDY N/A 10/09/2021   Procedure: GIVENS CAPSULE STUDY;  Surgeon: Toledo, Benay Pike, MD;  Location: [facility] ENDOSCOPY;  Service: Gastroenterology;  Laterality: N/A;   LEFT HEART CATH AND CORONARY ANGIOGRAPHY N/A 06/24/2018   Procedure: LEFT HEART CATH AND CORONARY ANGIOGRAPHY;  Surgeon: [name], MD;  Location: [place] CV LAB;  Service: Cardiovascular;  Laterality: N/A;   LOWER EXTREMITY ANGIOGRAPHY Bilateral 10/25/2021   Procedure: Lower Extremity Angiography;  Surgeon: Serafina Mitchell, MD;  Location: [place] CV LAB;  Service: Cardiovascular;  Laterality: Bilateral;   PERIPHERAL VASCULAR ATHERECTOMY Right 06/08/2020   Procedure: PERIPHERAL VASCULAR ATHERECTOMY;  Surgeon: Serafina Mitchell, MD;  Location: [place] CV LAB;  Service: Cardiovascular;  Laterality: Right;  superficial femoral   PERIPHERAL VASCULAR BALLOON ANGIOPLASTY Right 06/08/2020   Procedure: PERIPHERAL VASCULAR BALLOON ANGIOPLASTY;  Surgeon: Serafina Mitchell, MD;  Location: [place] [facility] LAB;  Service: Cardiovascular;  Laterality: Right;  External iliac   PERIPHERAL VASCULAR INTERVENTION Right 10/25/2021   Procedure: PERIPHERAL VASCULAR INTERVENTION;  Surgeon:  Serafina Mitchell, MD;  Location: [place] CV LAB;  Service: Cardiovascular;  Laterality: Right;  SFA   RENAL ANGIOGRAPHY Bilateral 10/25/2021   Procedure: RENAL ANGIOGRAPHY;  Surgeon: Serafina Mitchell, MD;  Location: [place] CV LAB;  Service: Cardiovascular;  Laterality: Bilateral;   TEE WITHOUT CARDIOVERSION N/A 07/11/2018   Procedure: TRANSESOPHAGEAL ECHOCARDIOGRAM (TEE);  Surgeon: Gaye Pollack, MD;  Location: [place];  Service: Open Heart Surgery;  Laterality: N/A;   THYROIDECTOMY, PARTIAL  2016   VASCULAR SURGERY      Current Medications: Current Meds  Medication Sig   acetaminophen (TYLENOL) 500 MG tablet Take 1,000 mg by mouth every 6 (six) hours as needed for moderate pain or  headache.   apixaban (ELIQUIS) 2.5 MG TABS tablet Take 1 tablet (2.5 mg total) by mouth 2 (two) times daily.   Ascorbic Acid (VITAMIN C) 1000 MG tablet Take 1,000 mg by mouth daily.   Cholecalciferol (DIALYVITE VITAMIN D 5000 PO) Take 5,000 Units by mouth daily.   ferrous sulfate 325 (65 FE) MG EC tablet Take 1 tablet (325 mg total) by mouth daily.   hydrOXYzine (ATARAX) 10 MG tablet ONE-HALF TO ONE TABLET 3 TIMES DAILY AS NEEDED FOR ANXIETY   levothyroxine (SYNTHROID) 75 MCG tablet TAKE 1 TABLET BY MOUTH DAILY EXCEPT ON THURSDAYS TAKE 1.5 TABLETS   losartan (COZAAR) 25 MG tablet Take 1 tablet by mouth daily.   metoprolol tartrate (LOPRESSOR) 25 MG tablet Take 1 tablet (25 mg total) by mouth 2 (two) times daily.   Multiple Vitamins-Minerals (MULTIVITAMIN MEN 50+) TABS Take 1 tablet by mouth daily.   pantoprazole (PROTONIX) 40 MG tablet Take 1 tablet (40 mg total) by mouth daily.   REPATHA SURECLICK 756 MG/ML SOAJ INJECT '140MG'$  EVERY 14 DAYS   tamsulosin (FLOMAX) 0.4 MG CAPS capsule Take 1 capsule (0.4 mg total) by mouth daily.    Allergies:   Penicillins, Ace inhibitors, Aspirin, Celebrex [celecoxib], and Lipitor [atorvastatin]   Social History   Socioeconomic History   Marital status: Married    Spouse name: Not on file   Number of children: Not on file   Years of education: Not on file   Highest education level: Not on file  Occupational History   Not on file  Tobacco Use   Smoking status: Former    Types: Cigarettes    Quit date: 09/04/2008    Years since quitting: 13.6   Smokeless tobacco: Former    Types: Chew   Tobacco comments:    HAs quit tobacco products 2009  Vaping Use   Vaping Use: Never used  Substance and Sexual Activity   Alcohol use: Yes    Comment: occ   Drug use: No   Sexual activity: Yes  Other Topics Concern   Not on file  Social History Narrative   Married 50+ years, 2 kids   [place]ale to play golf   Social Determinants of Health    Financial Resource Strain: Low Risk  (12/22/2021)   Overall Financial Resource Strain (CARDIA)    Difficulty of Paying Living Expenses: Not hard at all  Food Insecurity: No Food Insecurity (12/22/2021)   Hunger Vital Sign    Worried About Running Out of Food in the Last Year: Never true    [place] in the Last Year: Never true  Transportation Needs: No Transportation Needs (12/22/2021)   PRAPARE - Hydrologist (Medical): No  Lack of Transportation (Non-Medical): No  Physical Activity: Insufficiently Active (12/22/2021)   Exercise Vital Sign    Days of Exercise per Week: 3 days    Minutes of Exercise per Session: 30 min  Stress: No Stress Concern Present (12/22/2021)   [place]    Feeling of Stress : Not at all  Social Connections: Moderately Integrated (12/22/2021)   Social Connection and Isolation Panel [NHANES]    Frequency of Communication with Friends and Family: More than three times a week    Frequency of Social Gatherings with Friends and Family: More than three times a week    Attends Religious Services: More than 4 times per year    Active Member of [employer] or Organizations: No    Attends Archivist Meetings: Never    Marital Status: Married     Family History:  The patient's family history includes Diabetes in his mother; Stroke in his father. There is no history of Colon cancer or Prostate cancer.  ROS:   12-point review of systems is negative unless otherwise noted in the HPI.   EKGs/Labs/Other Studies Reviewed:    Studies reviewed were summarized above. The additional studies were reviewed today:  [place] MPI 11/18/2021:   The study is normal. The study is low risk.   No ST deviation was noted.   LV perfusion is normal. There is no evidence of ischemia. There is no evidence of infarction.   Left ventricular function is normal. Nuclear stress EF: 62 %.  The left ventricular ejection fraction is normal (55-65%). End diastolic cavity size is normal. End systolic cavity size is normal.   Prior study not available for comparison.   Normal stress nuclear study with no ischemia or infarction.  Gated ejection fraction 62% with normal wall motion. __________  Elwyn Reach patch 08/2021: Patient had a min HR of 46 bpm, max HR of 187 bpm, and avg HR of 71 bpm. Predominant underlying rhythm was Sinus Rhythm. 1 run of Ventricular Tachycardia occurred lasting 8 beats with a max rate of 156 bpm (avg 138 bpm). 27 Supraventricular Tachycardia  runs occurred, the run with the fastest interval lasting 13 beats with a max rate of 187 bpm, the longest lasting 11 beats with an avg rate of 103 bpm. Some episodes of Supraventricular Tachycardia may be possible Atrial Tachycardia with variable block.  True duration of Supraventricular Tachycardia difficult to ascertain due to artifact. Isolated SVEs were rare (<1.0%), SVE Couplets were rare (<1.0%), and SVE Triplets were rare (<1.0%). Isolated VEs were rare (<1.0%), VE Couplets were rare (<1.0%), and  no VE Triplets were present.   1: Sinus rhythm/sinus bradycardia/sinus tachycardia 2: Rare PACs and PVCs 3: Rare short episodes of nonsustained ventricular tachycardia and SVT (27 episodes of SVT). __________  2D echo 09/19/2021: 1. Left ventricular ejection fraction, by estimation, is 60 to 65%. The  left ventricle has normal function. The left ventricle has no regional  wall motion abnormalities. There is mild left ventricular hypertrophy.  Left ventricular diastolic parameters  are consistent with Grade I diastolic dysfunction (impaired relaxation).   2. Right ventricular systolic function is normal. The right ventricular  size is normal. There is normal pulmonary artery systolic pressure. The  estimated right ventricular systolic pressure is 81.8 mmHg.   3. The mitral valve is normal in structure. No evidence of mitral  valve  regurgitation.   4. The aortic valve was not well visualized. Aortic valve regurgitation  is not visualized. Mild aortic valve stenosis. Vmax 2.2 m/s, MG 11 mmHg,  DI 0.51   5. The inferior vena cava is normal in size with greater than 50%  respiratory variability, suggesting right atrial pressure of 3 mmHg. __________  2D echo 08/12/2019: 1. Left ventricular ejection fraction, by visual estimation, is 55 to  60%. The left ventricle has normal function. There is moderately increased  left ventricular hypertrophy.   2. Elevated left atrial pressure.   3. Left ventricular diastolic parameters are consistent with Grade I  diastolic dysfunction (impaired relaxation).   4. The left ventricle has no regional wall motion abnormalities.   5. Global right ventricle has normal systolic function.The right  ventricular size is mildly enlarged. No increase in right ventricular wall  thickness.   6. Left atrial size was mildly dilated.   7. Right atrial size was mildly dilated.   8. The pericardium was not well visualized.   9. The mitral valve is degenerative. Mild mitral valve regurgitation.  10. The tricuspid valve is normal in structure. Tricuspid valve  regurgitation is trivial.  11. The aortic valve is tricuspid. Aortic valve regurgitation is not  visualized. 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  12. The pulmonic valve was not well visualized. Pulmonic valve  regurgitation is not visualized.  13. The interatrial septum was not well visualized. __________  Intraoperative TEE 07/11/2018:  Left atrium: No spontaneous echo contrast.   Aortic valve: The valve is trileaflet. No stenosis. No regurgitation.   Mitral valve: Trace regurgitation.   Right ventricle: Normal cavity size, wall thickness and ejection  fraction.   Tricuspid valve: Trace regurgitation.  __________  LHC 06/24/2018: Ost Cx to Prox Cx lesion is 90%  stenosed. Prox LAD to Mid LAD lesion is 60% stenosed. Ost 2nd Mrg lesion is 80% stenosed. Prox RCA to Mid RCA lesion is 80% stenosed. The left ventricular systolic function is normal. LV end diastolic pressure is normal. The left ventricular ejection fraction is 55-65% by visual estimate.  Mr. Zoeller has a coronary calcium score of over 2000, severe highly calcified three-vessel CAD with a positive GXT, chest pain and moderate segmental disease in the proximal to mid LAD, ostial circumflex and mid dominant RCA with normal LV function.  I believe the best therapy would be coronary artery bypass grafting.  The sheath was removed and a TR band was placed on the right wrist to achieve patent hemostasis.  The patient left the lab in stable condition.  He will be discharged as an outpatient today and will be seen by TCT S as an outpatient for consideration of bypass grafting. __________  See Epic for remaining studies.   EKG:  EKG is ordered today.  The EKG ordered today demonstrates NSR, 63 bpm, no acute ST-T changes  Recent Labs: 08/25/2021: TSH 2.246 09/01/2021: Magnesium 2.1 12/07/2021: ALT 14; BUN 25; Creatinine, Ser 1.29; Potassium 4.5; Sodium 134 04/26/2022: Hemoglobin 13.0; Platelets 170.0  Recent Lipid Panel    Component Value Date/Time   CHOL 145 08/05/2021 0925   CHOL 119 04/27/2020 0000   CHOL 227 11/15/2012 0000   TRIG 72 08/05/2021 0925   TRIG 209 (A) 04/27/2020 0000   HDL 78 08/05/2021 0925   CHOLHDL 1.9 08/05/2021 0925   CHOLHDL 2 12/30/2020 1128   VLDL 50.0 (H) 12/30/2020 1128   LDLCALC 53 08/05/2021 0925   LDLCALC 23 04/27/2020 0000   LDLDIRECT 41.0 12/30/2020 1128    PHYSICAL  EXAM:    VS:  BP (!) 160/58 (BP Location: Left Arm, Patient Position: Sitting, Cuff Size: Normal) Comment: AFTER EKG  Pulse 63   Ht '5\' 6"'$  (1.676 m)   Wt 159 lb 4 oz (72.2 kg)   SpO2 98%   BMI 25.70 kg/m   BMI: Body mass index is 25.7 kg/m.  Physical Exam Vitals reviewed.   Constitutional:      Appearance: He is well-developed.  HENT:     Head: Normocephalic and atraumatic.  Eyes:     General:        Right eye: No discharge.        Left eye: No discharge.  Neck:     Vascular: No JVD.  Cardiovascular:     Rate and Rhythm: Normal rate and regular rhythm.     Heart sounds: Normal heart sounds, S1 normal and S2 normal. Heart sounds not distant. No midsystolic click and no opening snap. No murmur heard.    No friction rub.  Pulmonary:     Effort: Pulmonary effort is normal. No respiratory distress.     Breath sounds: Normal breath sounds. No decreased breath sounds, wheezing or rales.  Chest:     Chest wall: No tenderness.  Abdominal:     General: There is no distension.  Musculoskeletal:     Cervical back: Normal range of motion.     Right lower leg: No edema.     Left lower leg: No edema.  Skin:    General: Skin is warm and dry.     Nails: There is no clubbing.  Neurological:     Mental Status: He is alert and oriented to person, place, and time.  Psychiatric:        Speech: Speech normal.        Behavior: Behavior normal.        Thought Content: Thought content normal.        Judgment: Judgment normal.     Wt Readings from Last 3 Encounters:  05/09/22 159 lb 4 oz (72.2 kg)  02/27/22 156 lb (70.8 kg)  02/13/22 159 lb (72.1 kg)     ASSESSMENT & PLAN:   CAD status post CABG without angina: He is doing well and is without symptoms of angina.  Continue aggressive risk factor modification and secondary prevention.  He is on apixaban in place of aspirin/clopidogrel given underlying A-fib and in the context of minimize bleeding.  He remains on Lopressor, losartan, and Repatha.  No indication for further ischemic testing at this time.  PAF: Maintaining sinus rhythm on Lopressor 25 mg twice daily.  Historically, apixaban has been interrupted due to GI bleed with transfusion dependent anemia.  More recently, he was rechallenged with apixaban 2.5 mg  twice daily (does not meet reduced dosing criteria) in 01/2022.  Follow-up Hgb stable.  However, he was noted to have a Hemoccult positive stool in 04/2022.  Given positive Hemoccult stool, it is difficult to titrate his dose of apixaban to therapeutic dosing.  This is also his preference as well.  Check CBC and BMP.  We need to formalize a long-term plan for his anticoagulation/stroke risk reduction.  Therefore, we will refer him to the structural heart team for further discussion of possible Watchman.  This was also noted by Dr. Rockey Situ on 02/06/2022.  HTN: Blood pressure is mildly elevated in the office today.  Focus of today's visit was his underlying CAD and A-fib complicated by transfusion dependent anemia.  Therefore, no changes  were made at this time.  Continue current therapy.  Revisit in follow-up.  HLD: LDL 53 in 08/2021.  He remains on Repatha.  PAD: No symptoms of lifestyle limiting claudication or nonhealing wounds.  Not currently on aspirin or clopidogrel given prior history of transfusion dependent anemia and GI bleeding.  Follow-up with vascular surgery as directed with recommendation to discuss antiplatelet therapy.  Anemia with Hemoccult positive stool: Most recent hemoglobin stable.  Check CBC.  Follow-up with PCP and GI.  Hemodynamically stable in the office today.    Disposition: F/u with Dr. Rockey Situ or an APP in 3 months.   Medication Adjustments/Labs and Tests Ordered: Current medicines are reviewed at length with the patient today.  Concerns regarding medicines are outlined above. Medication changes, Labs and Tests ordered today are summarized above and listed in the Patient Instructions accessible in Encounters.   Signed, Christell Faith, PA-C 05/09/2022 11:49 AM     [place] [address] [place] [address] [place], [place] [address] [phone]

## 2022-05-10 ENCOUNTER — Telehealth: Payer: Self-pay | Admitting: *Deleted

## 2022-05-10 ENCOUNTER — Other Ambulatory Visit: Payer: Self-pay | Admitting: Family Medicine

## 2022-05-10 DIAGNOSIS — Z8719 Personal history of other diseases of the digestive system: Secondary | ICD-10-CM

## 2022-05-10 DIAGNOSIS — D649 Anemia, unspecified: Secondary | ICD-10-CM

## 2022-05-10 DIAGNOSIS — R195 Other fecal abnormalities: Secondary | ICD-10-CM

## 2022-05-10 NOTE — Telephone Encounter (Signed)
Reviewed results and recommendations with patient. He verbalized understanding of all information and had no further questions at this time. Referral placed for GI and results have been sent to his PCP

## 2022-05-10 NOTE — Telephone Encounter (Signed)
-----   Message from Rise Mu, [place] sent at 05/09/2022 12:54 PM EDT ----- Please inform the patient his hemoglobin is lower at 11.5 (down from 13.0 on 04/26/2022 with noted positive Hemoccult stool at that time.  WBC count is mildly elevated, though consistent with prior readings.  Platelet count remains normal.  Renal function and electrolytes are normal.  Recommendations: -Given his ongoing/downtrending hemoglobin he needs to follow-up with his PCP/GI as soon as possible for ongoing management -For now, he can continue apixaban, however as discussed at his office visit today, we need to formalize a long-term plan for stroke risk reduction.  Given his recrrent anemia on OAC/antiplatelet therapy, I believe he would be a good candidate for Watchman (referral was made today) -His ongoing anemia is likely contributing to his overall fatigue  I will CC his PCP as an [place]

## 2022-05-11 ENCOUNTER — Inpatient Hospital Stay: Payer: Medicare Other

## 2022-05-11 ENCOUNTER — Inpatient Hospital Stay: Payer: Medicare Other | Admitting: Oncology

## 2022-05-17 ENCOUNTER — Ambulatory Visit: Payer: Self-pay

## 2022-05-17 NOTE — Patient Outreach (Signed)
  Care Coordination   Initial Visit Note   05/17/2022 Name: Ryan Pittman MRN: [id number] DOB: [birth year]  Ryan Pittman is a 81 y.o. year old male who sees Tonia Ghent, MD for primary care. I spoke with  Ryan Pittman by phone today.  What matters to the patients health and wellness today?  Patient verbalized he has no nursing or community resources needs at this time.  Patient states he has a scheduled visit to talk with a doctor regarding " something that will be implanted to stop blood clots."  RNCM discussed scheduled visit with Dr. Burt Knack on 05/31/22 to discuss The watchman implant.  RNCM inquired of patient again if he would like follow up for care coordination program.  Patient declined.   Goals Addressed             This Visit's Progress    COMPLETED: Care coordination activities- no follow up needed.       Care Coordination Interventions: Care coordination program/ services discussed  Social determinants of health survey completed Reviewed scheduled / upcoming provider visits with patient Discussed with patient reason for watchman implant Advised patient to contact his provider or RNCM if care coordination services needed in the future. Patient provided contact phone number for [employer]          SDOH assessments and interventions completed:  Yes     Care Coordination Interventions Activated:  Yes  Care Coordination Interventions:  Yes, provided   Follow up plan: No further intervention required.   Encounter Outcome:  Pt. Visit Completed   Quinn Plowman RN,BSN,CCM [place] [phone] direct line

## 2022-05-17 NOTE — Patient Instructions (Signed)
Visit Information  Thank you for taking time to visit with me today. Please don't hesitate to contact me if I can be of assistance to you.   Following are the goals we discussed today:   Goals Addressed             This Visit's Progress    COMPLETED: Care coordination activities- no follow up needed.       Care Coordination Interventions: Care coordination program/ services discussed  Social determinants of health survey completed Reviewed scheduled / upcoming provider visits with patient Discussed with patient reason for watchman implant Advised patient to contact his provider or RNCM if care coordination services needed in the future. Patient provided contact phone number for [facility]          If you are experiencing a Mental Health or [place] or need someone to talk to, please call 1-800-273-TALK (toll free, 24 hour hotline)  Patient verbalizes understanding of instructions and care plan provided today and agrees to view in [facility]. Active MyChart status and patient understanding of how to access instructions and care plan via MyChart confirmed with patient.     No further follow up required:    Quinn Plowman RN,BSN,CCM [place] [phone] direct line

## 2022-05-19 ENCOUNTER — Other Ambulatory Visit (INDEPENDENT_AMBULATORY_CARE_PROVIDER_SITE_OTHER): Payer: Medicare Other

## 2022-05-19 DIAGNOSIS — D649 Anemia, unspecified: Secondary | ICD-10-CM

## 2022-05-19 LAB — CBC WITH DIFFERENTIAL/PLATELET
Basophils Absolute: 0.1 10*3/uL (ref 0.0–0.1)
Basophils Relative: 1.1 % (ref 0.0–3.0)
Eosinophils Absolute: 0.2 10*3/uL (ref 0.0–0.7)
Eosinophils Relative: 1.9 % (ref 0.0–5.0)
HCT: 30.8 % — ABNORMAL LOW (ref 39.0–52.0)
Hemoglobin: 10.6 g/dL — ABNORMAL LOW (ref 13.0–17.0)
Lymphocytes Relative: 14.5 % (ref 12.0–46.0)
Lymphs Abs: 1.6 10*3/uL (ref 0.7–4.0)
MCHC: 34.2 g/dL (ref 30.0–36.0)
MCV: 95.3 fl (ref 78.0–100.0)
Monocytes Absolute: 0.7 10*3/uL (ref 0.1–1.0)
Monocytes Relative: 6.6 % (ref 3.0–12.0)
Neutro Abs: 8.4 10*3/uL — ABNORMAL HIGH (ref 1.4–7.7)
Neutrophils Relative %: 75.9 % (ref 43.0–77.0)
Platelets: 190 10*3/uL (ref 150.0–400.0)
RBC: 3.24 Mil/uL — ABNORMAL LOW (ref 4.22–5.81)
RDW: 16 % — ABNORMAL HIGH (ref 11.5–15.5)
WBC: 11 10*3/uL — ABNORMAL HIGH (ref 4.0–10.5)

## 2022-05-19 LAB — IRON: Iron: 67 ug/dL (ref 42–165)

## 2022-05-19 LAB — FERRITIN: Ferritin: 52.7 ng/mL (ref 22.0–322.0)

## 2022-05-24 ENCOUNTER — Other Ambulatory Visit: Payer: Self-pay | Admitting: Family Medicine

## 2022-05-24 DIAGNOSIS — D649 Anemia, unspecified: Secondary | ICD-10-CM

## 2022-05-31 ENCOUNTER — Ambulatory Visit: Payer: Medicare Other | Attending: Cardiovascular Disease | Admitting: Cardiovascular Disease

## 2022-05-31 ENCOUNTER — Encounter: Payer: Self-pay | Admitting: Cardiovascular Disease

## 2022-05-31 VITALS — BP 150/60 | HR 69 | Ht 67.0 in | Wt 160.4 lb

## 2022-05-31 DIAGNOSIS — I701 Atherosclerosis of renal artery: Secondary | ICD-10-CM

## 2022-05-31 DIAGNOSIS — I4891 Unspecified atrial fibrillation: Secondary | ICD-10-CM | POA: Diagnosis not present

## 2022-05-31 DIAGNOSIS — Z01810 Encounter for preprocedural cardiovascular examination: Secondary | ICD-10-CM | POA: Diagnosis not present

## 2022-05-31 LAB — BASIC METABOLIC PANEL
BUN/Creatinine Ratio: 19 (ref 10–24)
BUN: 22 mg/dL (ref 8–27)
CO2: 27 mmol/L (ref 20–29)
Calcium: 8.9 mg/dL (ref 8.6–10.2)
Chloride: 105 mmol/L (ref 96–106)
Creatinine, Ser: 1.15 mg/dL (ref 0.76–1.27)
Glucose: 137 mg/dL — ABNORMAL HIGH (ref 70–99)
Potassium: 4.4 mmol/L (ref 3.5–5.2)
Sodium: 138 mmol/L (ref 134–144)
eGFR: 63 mL/min/{1.73_m2} (ref >59)

## 2022-05-31 LAB — CBC
Hematocrit: 31.8 % — ABNORMAL LOW (ref 37.5–51.0)
Hemoglobin: 10.4 g/dL — ABNORMAL LOW (ref 13.0–17.7)
MCH: 32 pg (ref 26.6–33.0)
MCHC: 32.7 g/dL (ref 31.5–35.7)
MCV: 98 fL — ABNORMAL HIGH (ref 79–97)
Platelets: 229 10*3/uL (ref 150–450)
RBC: 3.25 x10E6/uL — ABNORMAL LOW (ref 4.14–5.80)
RDW: 16.2 % — ABNORMAL HIGH (ref 11.6–15.4)
WBC: 10.2 10*3/uL (ref 3.4–10.8)

## 2022-05-31 MED ORDER — CLOPIDOGREL BISULFATE 75 MG PO TABS: 75.0000 mg | ORAL_TABLET | Freq: Every day | ORAL | 3 refills | Status: DC

## 2022-05-31 NOTE — Patient Instructions (Addendum)
Medication Instructions:  START Plavix '75mg'$  daily *If you need a refill on your cardiac medications before your next appointment, please call your pharmacy*   Lab Work: CBC, BMET Today If you have labs (blood work) drawn today and your tests are completely normal, you will receive your results only by: [place] (if you have MyChart) OR A paper copy in the mail If you have any lab test that is abnormal or we need to change your treatment, we will call you to review the results.   Testing/Procedures: Watchman CT  Your physician has requested that you have cardiac CT. Cardiac computed tomography (CT) is a painless test that uses an x-ray machine to take clear, detailed pictures of your heart. For further information please visit [email or web address]. Please follow instruction sheet as given.  Follow-Up: At [facility], you and your health needs are our priority.  As part of our continuing mission to provide you with exceptional heart care, we have created designated Provider Care Teams.  These Care Teams include your primary Cardiologist (physician) and Advanced Practice Providers (APPs -  Physician Assistants and Nurse Practitioners) who all work together to provide you with the care you need, when you need it.  Your next appointment:   Structural Team will follow-up  The format for your next appointment:   In Person  Provider:   [name], MD  Other Instructions WATCHMAN CT INSTRUCTIONS: Your WATCHMAN CT will be scheduled at [place] will be called to confirm date and time.  The day of your CT appointment, please enter [facility] through the [employer] [facility] Entrance (Entrance [address] off [facility] [address] [facility] You may use the FREE valet parking offered at entrance C (encouraged to control the heart rate for the test). Check in at the large round desk through the glass doors 30 minutes prior to your scheduled appointment.   Please follow these  instructions carefully:  Hold all erectile dysfunction medications at least 3 days (72 hrs) prior to test.  On the Night Before the Test: Be sure to Drink plenty of water. Do not consume any caffeinated/decaffeinated beverages or chocolate 12 hours prior to your test. Do not take any antihistamines 12 hours prior to your test.  On the Day of the Test: Drink plenty of water until 1 hour prior to the test. Do not eat any food 4 hours prior to the test. You may take your regular medications prior to the test.       After the Test: Drink plenty of water. After receiving IV contrast, you may experience a mild flushed feeling. This is normal. On occasion, you may experience a mild rash up to 24 hours after the test. This is not dangerous. If this occurs, you can take Benadryl 25 mg and increase your fluid intake. If you experience trouble breathing, this can be serious. If it is severe call 911 IMMEDIATELY. If it is mild, please call our office. If you take any of these medications: Glipizide/Metformin, Avandament, Glucavance, please do not take 48 hours after completing test unless otherwise instructed.  Once we have confirmed authorization from your insurance company, you will be called to set up a date and time for your test.   For non-scheduling related questions/concerns about your CT scan, please contact the cardiac imaging nurses: Marchia Bond, Cardiac Imaging Nurse Navigator Gordy Clement, Cardiac Imaging Nurse [place] and [facility] Services Direct Office Dial: [phone]   For scheduling needs, including cancellations and rescheduling,  please call [place], [phone].  Lenice Llamas, the Watchman Nurse Navigator, will call you after your CT once the [facility] Team has reviewed your imaging for an update on proceedings. [place]'s direct number is [phone] if you need assistance.   Important Information About [place]

## 2022-05-31 NOTE — H&P (View-Only) (Signed)
Watchman Consult Note   Date:  05/31/2022   ID:  Ryan Pittman, DOB 1941/06/01, MRN [id number]  PCP:  Tonia Ghent, MD  Cardiologist:  Esmond Plants, MD Primary Electrophysiologist: none Referring Physician: Christell Faith, PA and Esmond Plants, MD   CC: to discuss Watchman implant    History of Present Illness: Ryan Pittman is a 81 y.o. male referred by Dr Rockey Situ and Christell Faith for evaluation of atrial fibrillation and stroke prevention. He has paroxysmal atrial fibrillation.  The patient has been evaluated by their referring physician and is felt to be a poor candidate for long term [place] due to GI bleeding.  He therefore presents today for Watchman evaluation.  The patient was hospitalized in February of this year with GI bleeding and hemoglobin down to about 7 mg/dL.  He received 2 units of packed red blood cell transfusion and his hemoglobin improved to about 9 mg/dL.  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  The patient is here with his wife today.  He has complained of generalized fatigue that he generally associates with his anemia.  He has had a good bit of trouble tolerating apixaban and most recently has been on reduced dose apixaban 2.5 mg twice daily.  His stools were guaiac positive even on low-dose apixaban and he has completely discontinued this.  He is also discontinued antiplatelet therapy as he was previously on clopidogrel for peripheral arterial disease with history of left femoropopliteal bypass and right SFA stenting.  His main complaint now is right calf claudication with walking.  He feels tired and short of breath with exertion but has no chest pain, heart palpitations, lightheadedness, or syncope.  He has not seen frank blood in his stool.    Past Medical History:  Diagnosis Date   Anemia    due to GIB, s/p transfusion   Anginal pain ([place])  07/10/2018   Arthritis    back pain, much worse after consecutive golf rounds   Cancer [facility])    thyroid   Chronic kidney disease    Colon polyps    Coronary artery disease    COVID-19    Dyspnea    walking up a hill   Dysrhythmia 2020   GERD (gastroesophageal reflux disease)    Hypercalcemia    h/o, resolved as of 2012, prev due to high amount of calcium intake   Hyperlipidemia    Hypertension    Hypothyroidism    IgG gammopathy    stable as of 2012 per [facility]   Pneumonia    as a child   PVD (peripheral vascular disease) ([place])    L leg bypass, R leg stented   Past Surgical History:  Procedure Laterality Date    dental implant left bottom-did have bleeding      ABDOMINAL AORTOGRAM W/LOWER EXTREMITY Bilateral 06/26/2019   Procedure: ABDOMINAL AORTOGRAM W/LOWER EXTREMITY;  Surgeon: [name], MD;  Location: [place] CV LAB;  Service: Cardiovascular;  Laterality: Bilateral;   ABDOMINAL AORTOGRAM W/LOWER EXTREMITY N/A 06/08/2020   Procedure: ABDOMINAL AORTOGRAM W/LOWER EXTREMITY;  Surgeon: Serafina Mitchell, MD;  Location: [place] CV LAB;  Service: Cardiovascular;  Laterality: N/A;   BYPASS GRAFT     L leg   CARDIAC CATHETERIZATION  06/24/2018   COLONOSCOPY N/A 10/09/2021   Procedure: COLONOSCOPY;  Surgeon: Toledo, Benay Pike, MD;  Location: [facility] ENDOSCOPY;  Service: Gastroenterology;  Laterality: N/A;   COLONOSCOPY WITH PROPOFOL N/A 05/04/2015   Procedure: COLONOSCOPY WITH PROPOFOL;  Surgeon: Lollie Sails, MD;  Location: [facility] ENDOSCOPY;  Service: Endoscopy;  Laterality: N/A;   CORONARY ARTERY BYPASS GRAFT N/A 07/11/2018   Procedure: CORONARY ARTERY BYPASS GRAFTING (CABG) x three, using left internal mammary artery and right leg greater saphenous vein harvested endoscopically;  Surgeon: Gaye Pollack, MD;  Location: [place] OR;  Service: Open Heart Surgery;  Laterality: N/A;   ENDARTERECTOMY FEMORAL Right 02/13/2020   Procedure: RIGHT ENDARTERECTOMY FEMORAL WITH BOVINE  PATCH;  Surgeon: Serafina Mitchell, MD;  Location: [facility] OR;  Service: Vascular;  Laterality: Right;   ESOPHAGOGASTRODUODENOSCOPY N/A 10/09/2021   Procedure: ESOPHAGOGASTRODUODENOSCOPY (EGD);  Surgeon: Toledo, Benay Pike, MD;  Location: [facility] ENDOSCOPY;  Service: Gastroenterology;  Laterality: N/A;   GIVENS CAPSULE STUDY N/A 10/09/2021   Procedure: GIVENS CAPSULE STUDY;  Surgeon: Toledo, Benay Pike, MD;  Location: [facility] ENDOSCOPY;  Service: Gastroenterology;  Laterality: N/A;   LEFT HEART CATH AND CORONARY ANGIOGRAPHY N/A 06/24/2018   Procedure: LEFT HEART CATH AND CORONARY ANGIOGRAPHY;  Surgeon: [name], MD;  Location: [place] CV LAB;  Service: Cardiovascular;  Laterality: N/A;   LOWER EXTREMITY ANGIOGRAPHY Bilateral 10/25/2021   Procedure: Lower Extremity Angiography;  Surgeon: Serafina Mitchell, MD;  Location: [place] CV LAB;  Service: Cardiovascular;  Laterality: Bilateral;   PERIPHERAL VASCULAR ATHERECTOMY Right 06/08/2020   Procedure: PERIPHERAL VASCULAR ATHERECTOMY;  Surgeon: Serafina Mitchell, MD;  Location: [place] CV LAB;  Service: Cardiovascular;  Laterality: Right;  superficial femoral   PERIPHERAL VASCULAR BALLOON ANGIOPLASTY Right 06/08/2020   Procedure: PERIPHERAL VASCULAR BALLOON ANGIOPLASTY;  Surgeon: Serafina Mitchell, MD;  Location: [place] CV LAB;  Service: Cardiovascular;  Laterality: Right;  External iliac   PERIPHERAL VASCULAR INTERVENTION Right 10/25/2021   Procedure: PERIPHERAL VASCULAR INTERVENTION;  Surgeon: Serafina Mitchell, MD;  Location: [place] CV LAB;  Service: Cardiovascular;  Laterality: Right;  SFA   RENAL ANGIOGRAPHY Bilateral 10/25/2021   Procedure: RENAL ANGIOGRAPHY;  Surgeon: Serafina Mitchell, MD;  Location: [place] CV LAB;  Service: Cardiovascular;  Laterality: Bilateral;   TEE WITHOUT CARDIOVERSION N/A 07/11/2018   Procedure: TRANSESOPHAGEAL ECHOCARDIOGRAM (TEE);  Surgeon: Gaye Pollack, MD;  Location: [place];  Service: Open Heart Surgery;   Laterality: N/A;   THYROIDECTOMY, PARTIAL  2016   VASCULAR SURGERY       Current Outpatient Medications  Medication Sig Dispense Refill   acetaminophen (TYLENOL) 500 MG tablet Take 1,000 mg by mouth every 6 (six) hours as needed for moderate pain or headache.     Ascorbic Acid (VITAMIN C) 1000 MG tablet Take 1,000 mg by mouth daily.     Cholecalciferol (DIALYVITE VITAMIN D 5000 PO) Take 5,000 Units by mouth daily.     clopidogrel (PLAVIX) 75 MG tablet Take 1 tablet (75 mg total) by mouth daily. 90 tablet 3   ferrous sulfate 325 (65 FE) MG EC tablet Take 1 tablet (325 mg total) by mouth daily. 90 tablet 1   hydrOXYzine (ATARAX) 10 MG tablet ONE-HALF TO ONE TABLET 3 TIMES DAILY AS NEEDED FOR ANXIETY 30 tablet 2   levothyroxine (SYNTHROID) 75 MCG tablet TAKE 1 TABLET BY MOUTH DAILY EXCEPT ON THURSDAYS TAKE 1.5 TABLETS 100 tablet 3   losartan (COZAAR) 25 MG tablet Take 1 tablet by mouth daily.     metoprolol tartrate (LOPRESSOR) 25 MG tablet Take 1 tablet (25 mg total) by mouth 2 (two) times daily.  180 tablet 3   Multiple Vitamins-Minerals (MULTIVITAMIN MEN 50+) TABS Take 1 tablet by mouth daily.     pantoprazole (PROTONIX) 40 MG tablet Take 1 tablet (40 mg total) by mouth daily. 90 tablet 3   REPATHA SURECLICK 277 MG/ML SOAJ INJECT '140MG'$  EVERY 14 DAYS 2 mL 11   tamsulosin (FLOMAX) 0.4 MG CAPS capsule Take 1 capsule (0.4 mg total) by mouth daily. 90 capsule 3   No current facility-administered medications for this visit.    Allergies:   Penicillins, Ace inhibitors, Aspirin, Celebrex [celecoxib], and Lipitor [atorvastatin]   Social History:  The patient  reports that he quit smoking about 13 years ago. His smoking use included cigarettes. He has quit using smokeless tobacco.  His smokeless tobacco use included chew. He reports current alcohol use. He reports that he does not use drugs.   Family History:  The patient's  family history includes Diabetes in his mother; Stroke in his father.     ROS:  Please see the history of present illness.   All other systems are reviewed and negative.    PHYSICAL EXAM: VS:  BP (!) 150/60   Pulse 69   Ht '5\' 7"'$  (1.702 m)   Wt 160 lb 6.4 oz (72.8 kg)   SpO2 99%   BMI 25.12 kg/m  , BMI Body mass index is 25.12 kg/m. GEN: Well nourished, well developed, in no acute distress  HEENT: normal  Neck: no JVD, carotid bruits, or masses Cardiac: RRR; no murmurs, rubs, or gallops,no edema  Respiratory:  clear to auscultation bilaterally, normal work of breathing GI: soft, nontender, nondistended, + BS MS: no deformity or atrophy  Skin: warm and dry  Neuro:  Strength and sensation are intact Psych: euthymic mood, full affect  EKG:  EKG is not ordered today.   Recent Labs: 08/25/2021: TSH 2.246 09/01/2021: Magnesium 2.1 12/07/2021: ALT 14 05/09/2022: BUN 23; Creatinine, Ser 1.18; Potassium 4.8; Sodium 139 05/19/2022: Hemoglobin 10.6; Platelets 190.0    Lipid Panel     Component Value Date/Time   CHOL 145 08/05/2021 0925   CHOL 119 04/27/2020 0000   CHOL 227 11/15/2012 0000   TRIG 72 08/05/2021 0925   TRIG 209 (A) 04/27/2020 0000   HDL 78 08/05/2021 0925   CHOLHDL 1.9 08/05/2021 0925   CHOLHDL 2 12/30/2020 1128   VLDL 50.0 (H) 12/30/2020 1128   LDLCALC 53 08/05/2021 0925   LDLCALC 23 04/27/2020 0000   LDLDIRECT 41.0 12/30/2020 1128     Wt Readings from Last 3 Encounters:  05/31/22 160 lb 6.4 oz (72.8 kg)  05/09/22 159 lb 4 oz (72.2 kg)  02/27/22 156 lb (70.8 kg)      Other studies Reviewed: Additional studies/ records that were reviewed today include:  2D Echo 09/19/2021:  1. Left ventricular ejection fraction, by estimation, is 60 to 65%. The  left ventricle has normal function. The left ventricle has no regional  wall motion abnormalities. There is mild left ventricular hypertrophy.  Left ventricular diastolic parameters  are consistent with Grade I diastolic dysfunction (impaired relaxation).   2. Right ventricular  systolic function is normal. The right ventricular  size is normal. There is normal pulmonary artery systolic pressure. The  estimated right ventricular systolic pressure is 82.4 mmHg.   3. The mitral valve is normal in structure. No evidence of mitral valve  regurgitation.   4. The aortic valve was not well visualized. Aortic valve regurgitation  is not visualized. Mild aortic valve stenosis. Vmax 2.2  m/s, MG 11 mmHg,  DI 0.51   5. The inferior vena cava is normal in size with greater than 50%  respiratory variability, suggesting right atrial pressure of 3 mmHg.     ASSESSMENT AND PLAN:  1.  Paroxysmal atrial fibrillation I have seen Ryan Pittman is a 81 y.o. male in the office today who has been referred for a Watchman left atrial appendage closure device.  He has a history of paroxysmal atrial fibrillation.  The patient's atrial fibrillation-related risk scores are outlined below:     CHA2DS2-VASc Score = 4   This indicates a 4.8% annual risk of stroke. The patient's score is based upon: CHF History: 0 HTN History: 1 Diabetes History: 0 Stroke History: 0 Vascular Disease History: 1 Age Score: 2 Gender Score: 0       HAS-BLED score  Hypertension No  Abnormal renal and liver function (Dialysis, transplant, Cr >2.26 mg/dL /Cirrhosis or Bilirubin >2x Normal or AST/ALT/AP >3x Normal) No  Stroke No  Bleeding Yes  Labile INR (Unstable/high INR) No  Elderly (>65) Yes  Drugs or alcohol (? 8 drinks/week, anti-plt or NSAID) No   Unfortunately, He is not felt to be a long term anticoagulation candidate secondary to small bowel AVMs and recurrent gastrointestinal bleeding.  The patients chart has been reviewed and I along with their referring cardiologist feel that they would be a candidate for short term oral anticoagulation.  Procedural risks for the Watchman implant have been reviewed with the patient including a 1% risk of stroke, 1% risk of perforation or pericardial  effusion, 0.1% risk of device embolization.  Given the patient's poor candidacy for long-term oral anticoagulation, ability to tolerate short term oral anticoagulation, I have recommended consideration of the Watchman FLX left atrial appendage closure device through a shared decision making conversation with the patient.  I demonstrated procedural animation to the patient and his wife today.  We reviewed each step of the procedure and discussed potential complications.  We reviewed expected recovery.  We had specific discussion about the anticoagulation strategy around left atrial appendage occlusion.  While anticoagulant therapy has historically been indicated, the FDA has approved dual antiplatelet therapy as well.  The patient has tolerated clopidogrel and antiplatelet therapy better than he has anticoagulation and he has even had significant bleeding on low-dose apixaban.  I do not think he will tolerate this and the preferred approach would be aspirin and clopidogrel and his particular situation.  I may start him back on clopidogrel alone to make sure that he tolerates this.  I will order a gated cardiac CTA to assess his left atrial appendage anatomy and suitability for watchman implantation.  Once his CTA is resulted, we will be in touch about scheduling him for watchman implantation if he has suitable anatomy.  In the meantime, we will check a CBC and metabolic panel today.  I will also refer him back to Dr. Trula Slade for reassessment of his right leg claudication symptoms per the patient's request.  Current medicines are reviewed at length with the patient today.   The patient does not have concerns regarding his medicines.  The following changes were made today:  Add clopidogrel 75 mg daily  Labs/ tests ordered today include:  Orders Placed This Encounter  Procedures   CT CARDIAC MORPH/PULM VEIN W/CM&W/O CA SCORE   CBC   Basic metabolic panel     Signed, [name], MD 05/31/2022  9:31 AM      [place] [address] [place] [address] [phone] (office) [phone] (fax)

## 2022-05-31 NOTE — Progress Notes (Unsigned)
Watchman Consult Note   Date:  05/31/2022   ID:  Ryan Pittman, DOB [birth year], MRN [id number]  PCP:  Tonia Ghent, MD  Cardiologist:  Esmond Plants, MD Primary Electrophysiologist: none Referring Physician: Christell Faith, PA and Esmond Plants, MD   CC: to discuss Watchman implant    History of Present Illness: Ryan Pittman is a 81 y.o. male referred by Dr Rockey Situ and Christell Faith for evaluation of atrial fibrillation and stroke prevention. He has paroxysmal atrial fibrillation.  The patient has been evaluated by their referring physician and is felt to be a poor candidate for long term [place] due to GI bleeding.  He therefore presents today for Watchman evaluation.  The patient was hospitalized in February of this year with GI bleeding and hemoglobin down to about 7 mg/dL.  He received 2 units of packed red blood cell transfusion and his hemoglobin improved to about 9 mg/dL.  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  The patient is here with his wife today.  He has complained of generalized fatigue that he generally associates with his anemia.  He has had a good bit of trouble tolerating apixaban and most recently has been on reduced dose apixaban 2.5 mg twice daily.  His stools were guaiac positive even on low-dose apixaban and he has completely discontinued this.  He is also discontinued antiplatelet therapy as he was previously on clopidogrel for peripheral arterial disease with history of left femoropopliteal bypass and right SFA stenting.  His main complaint now is right calf claudication with walking.  He feels tired and short of breath with exertion but has no chest pain, heart palpitations, lightheadedness, or syncope.  He has not seen frank blood in his stool.    Past Medical History:  Diagnosis Date   Anemia    due to GIB, s/p transfusion   Anginal pain ([place])  07/10/2018   Arthritis    back pain, much worse after consecutive golf rounds   Cancer [facility])    thyroid   Chronic kidney disease    Colon polyps    Coronary artery disease    COVID-19    Dyspnea    walking up a hill   Dysrhythmia 2020   GERD (gastroesophageal reflux disease)    Hypercalcemia    h/o, resolved as of 2012, prev due to high amount of calcium intake   Hyperlipidemia    Hypertension    Hypothyroidism    IgG gammopathy    stable as of 2012 per [facility]   Pneumonia    as a child   PVD (peripheral vascular disease) ([place])    L leg bypass, R leg stented   Past Surgical History:  Procedure Laterality Date    dental implant left bottom-did have bleeding      ABDOMINAL AORTOGRAM W/LOWER EXTREMITY Bilateral 06/26/2019   Procedure: ABDOMINAL AORTOGRAM W/LOWER EXTREMITY;  Surgeon: [name], MD;  Location: [place] [facility];  Service: Cardiovascular;  Laterality: Bilateral;   ABDOMINAL AORTOGRAM W/LOWER EXTREMITY N/A 06/08/2020   Procedure: ABDOMINAL AORTOGRAM W/LOWER EXTREMITY;  Surgeon: Serafina Mitchell, MD;  Location: [place] [facility];  Service: Cardiovascular;  Laterality: N/A;   BYPASS GRAFT     L leg   CARDIAC CATHETERIZATION  06/24/2018   COLONOSCOPY N/A 10/09/2021   Procedure: COLONOSCOPY;  Surgeon: Toledo, Benay Pike, MD;  Location: [facility] ENDOSCOPY;  Service: Gastroenterology;  Laterality: N/A;   COLONOSCOPY WITH PROPOFOL N/A 05/04/2015   Procedure: COLONOSCOPY WITH PROPOFOL;  Surgeon: Lollie Sails, MD;  Location: [facility] ENDOSCOPY;  Service: Endoscopy;  Laterality: N/A;   CORONARY ARTERY BYPASS GRAFT N/A 07/11/2018   Procedure: CORONARY ARTERY BYPASS GRAFTING (CABG) x three, using left internal mammary artery and right leg greater saphenous vein harvested endoscopically;  Surgeon: Gaye Pollack, MD;  Location: [place] OR;  Service: Open Heart Surgery;  Laterality: N/A;   ENDARTERECTOMY FEMORAL Right 02/13/2020   Procedure: RIGHT ENDARTERECTOMY FEMORAL WITH BOVINE  PATCH;  Surgeon: Serafina Mitchell, MD;  Location: [facility] OR;  Service: Vascular;  Laterality: Right;   ESOPHAGOGASTRODUODENOSCOPY N/A 10/09/2021   Procedure: ESOPHAGOGASTRODUODENOSCOPY (EGD);  Surgeon: Toledo, Benay Pike, MD;  Location: [facility] ENDOSCOPY;  Service: Gastroenterology;  Laterality: N/A;   GIVENS CAPSULE STUDY N/A 10/09/2021   Procedure: GIVENS CAPSULE STUDY;  Surgeon: Toledo, Benay Pike, MD;  Location: [facility] ENDOSCOPY;  Service: Gastroenterology;  Laterality: N/A;   LEFT HEART CATH AND CORONARY ANGIOGRAPHY N/A 06/24/2018   Procedure: LEFT HEART CATH AND CORONARY ANGIOGRAPHY;  Surgeon: [name], MD;  Location: [place] [facility];  Service: Cardiovascular;  Laterality: N/A;   LOWER EXTREMITY ANGIOGRAPHY Bilateral 10/25/2021   Procedure: Lower Extremity Angiography;  Surgeon: Serafina Mitchell, MD;  Location: [place] [facility];  Service: Cardiovascular;  Laterality: Bilateral;   PERIPHERAL VASCULAR ATHERECTOMY Right 06/08/2020   Procedure: PERIPHERAL VASCULAR ATHERECTOMY;  Surgeon: Serafina Mitchell, MD;  Location: [place] [facility];  Service: Cardiovascular;  Laterality: Right;  superficial femoral   PERIPHERAL VASCULAR BALLOON ANGIOPLASTY Right 06/08/2020   Procedure: PERIPHERAL VASCULAR BALLOON ANGIOPLASTY;  Surgeon: Serafina Mitchell, MD;  Location: [place] [facility];  Service: Cardiovascular;  Laterality: Right;  External iliac   PERIPHERAL VASCULAR INTERVENTION Right 10/25/2021   Procedure: PERIPHERAL VASCULAR INTERVENTION;  Surgeon: Serafina Mitchell, MD;  Location: [place] [facility] LAB;  Service: Cardiovascular;  Laterality: Right;  SFA   RENAL ANGIOGRAPHY Bilateral 10/25/2021   Procedure: RENAL ANGIOGRAPHY;  Surgeon: Serafina Mitchell, MD;  Location: [place] [facility];  Service: Cardiovascular;  Laterality: Bilateral;   TEE WITHOUT CARDIOVERSION N/A 07/11/2018   Procedure: TRANSESOPHAGEAL ECHOCARDIOGRAM (TEE);  Surgeon: Gaye Pollack, MD;  Location: [place];  Service: Open Heart Surgery;   Laterality: N/A;   THYROIDECTOMY, PARTIAL  2016   VASCULAR SURGERY       Current Outpatient Medications  Medication Sig Dispense Refill   acetaminophen (TYLENOL) 500 MG tablet Take 1,000 mg by mouth every 6 (six) hours as needed for moderate pain or headache.     Ascorbic Acid (VITAMIN C) 1000 MG tablet Take 1,000 mg by mouth daily.     Cholecalciferol (DIALYVITE VITAMIN D 5000 PO) Take 5,000 Units by mouth daily.     clopidogrel (PLAVIX) 75 MG tablet Take 1 tablet (75 mg total) by mouth daily. 90 tablet 3   ferrous sulfate 325 (65 FE) MG EC tablet Take 1 tablet (325 mg total) by mouth daily. 90 tablet 1   hydrOXYzine (ATARAX) 10 MG tablet ONE-HALF TO ONE TABLET 3 TIMES DAILY AS NEEDED FOR ANXIETY 30 tablet 2   levothyroxine (SYNTHROID) 75 MCG tablet TAKE 1 TABLET BY MOUTH DAILY EXCEPT ON THURSDAYS TAKE 1.5 TABLETS 100 tablet 3   losartan (COZAAR) 25 MG tablet Take 1 tablet by mouth daily.     metoprolol tartrate (LOPRESSOR) 25 MG tablet Take 1 tablet (25 mg total) by mouth 2 (two) times daily.  180 tablet 3   Multiple Vitamins-Minerals (MULTIVITAMIN MEN 50+) TABS Take 1 tablet by mouth daily.     pantoprazole (PROTONIX) 40 MG tablet Take 1 tablet (40 mg total) by mouth daily. 90 tablet 3   REPATHA SURECLICK 401 MG/ML SOAJ INJECT '140MG'$  EVERY 14 DAYS 2 mL 11   tamsulosin (FLOMAX) 0.4 MG CAPS capsule Take 1 capsule (0.4 mg total) by mouth daily. 90 capsule 3   No current facility-administered medications for this visit.    Allergies:   Penicillins, Ace inhibitors, Aspirin, Celebrex [celecoxib], and Lipitor [atorvastatin]   Social History:  The patient  reports that he quit smoking about 13 years ago. His smoking use included cigarettes. He has quit using smokeless tobacco.  His smokeless tobacco use included chew. He reports current alcohol use. He reports that he does not use drugs.   Family History:  The patient's  family history includes Diabetes in his mother; Stroke in his father.     ROS:  Please see the history of present illness.   All other systems are reviewed and negative.    PHYSICAL EXAM: VS:  BP (!) 150/60   Pulse 69   Ht '5\' 7"'$  (1.702 m)   Wt 160 lb 6.4 oz (72.8 kg)   SpO2 99%   BMI 25.12 kg/m  , BMI Body mass index is 25.12 kg/m. GEN: Well nourished, well developed, in no acute distress  HEENT: normal  Neck: no JVD, carotid bruits, or masses Cardiac: RRR; no murmurs, rubs, or gallops,no edema  Respiratory:  clear to auscultation bilaterally, normal work of breathing GI: soft, nontender, nondistended, + BS MS: no deformity or atrophy  Skin: warm and dry  Neuro:  Strength and sensation are intact Psych: euthymic mood, full affect  EKG:  EKG is not ordered today.   Recent Labs: 08/25/2021: TSH 2.246 09/01/2021: Magnesium 2.1 12/07/2021: ALT 14 05/09/2022: BUN 23; Creatinine, Ser 1.18; Potassium 4.8; Sodium 139 05/19/2022: Hemoglobin 10.6; Platelets 190.0    Lipid Panel     Component Value Date/Time   CHOL 145 08/05/2021 0925   CHOL 119 04/27/2020 0000   CHOL 227 11/15/2012 0000   TRIG 72 08/05/2021 0925   TRIG 209 (A) 04/27/2020 0000   HDL 78 08/05/2021 0925   CHOLHDL 1.9 08/05/2021 0925   CHOLHDL 2 12/30/2020 1128   VLDL 50.0 (H) 12/30/2020 1128   LDLCALC 53 08/05/2021 0925   LDLCALC 23 04/27/2020 0000   LDLDIRECT 41.0 12/30/2020 1128     Wt Readings from Last 3 Encounters:  05/31/22 160 lb 6.4 oz (72.8 kg)  05/09/22 159 lb 4 oz (72.2 kg)  02/27/22 156 lb (70.8 kg)      Other studies Reviewed: Additional studies/ records that were reviewed today include:  2D Echo 09/19/2021:  1. Left ventricular ejection fraction, by estimation, is 60 to 65%. The  left ventricle has normal function. The left ventricle has no regional  wall motion abnormalities. There is mild left ventricular hypertrophy.  Left ventricular diastolic parameters  are consistent with Grade I diastolic dysfunction (impaired relaxation).   2. Right ventricular  systolic function is normal. The right ventricular  size is normal. There is normal pulmonary artery systolic pressure. The  estimated right ventricular systolic pressure is 02.7 mmHg.   3. The mitral valve is normal in structure. No evidence of mitral valve  regurgitation.   4. The aortic valve was not well visualized. Aortic valve regurgitation  is not visualized. Mild aortic valve stenosis. Vmax 2.2  m/s, MG 11 mmHg,  DI 0.51   5. The inferior vena cava is normal in size with greater than 50%  respiratory variability, suggesting right atrial pressure of 3 mmHg.     ASSESSMENT AND PLAN:  1.  Paroxysmal atrial fibrillation I have seen ANGUEL DELAPENA is a 81 y.o. male in the office today who has been referred for a Watchman left atrial appendage closure device.  He has a history of paroxysmal atrial fibrillation.  The patient's atrial fibrillation-related risk scores are outlined below:     CHA2DS2-VASc Score = 4  {Confirm score is correct.  If not, click here to update score.  REFRESH note.  :1} This indicates a 4.8% annual risk of stroke. The patient's score is based upon: CHF History: 0 HTN History: 1 Diabetes History: 0 Stroke History: 0 Vascular Disease History: 1 Age Score: 2 Gender Score: 0   {This patient has a significant risk of stroke if diagnosed with atrial fibrillation.  Please consider VKA or DOAC agent for anticoagulation if the bleeding risk is acceptable.   You can also use the SmartPhrase .[email or web address] for documentation.   :[id number]}    HAS-BLED score  Hypertension No  Abnormal renal and liver function (Dialysis, transplant, Cr >2.26 mg/dL /Cirrhosis or Bilirubin >2x Normal or AST/ALT/AP >3x Normal) No  Stroke No  Bleeding Yes  Labile INR (Unstable/high INR) No  Elderly (>65) Yes  Drugs or alcohol (? 8 drinks/week, anti-plt or NSAID) No   Unfortunately, He is not felt to be a long term anticoagulation candidate secondary to small bowel AVMs and  recurrent gastrointestinal bleeding.  The patients chart has been reviewed and I along with their referring cardiologist feel that they would be a candidate for short term oral anticoagulation.  Procedural risks for the Watchman implant have been reviewed with the patient including a 1% risk of stroke, 1% risk of perforation or pericardial effusion, 0.1% risk of device embolization.  Given the patient's poor candidacy for long-term oral anticoagulation, ability to tolerate short term oral anticoagulation, I have recommended consideration of the Watchman FLX left atrial appendage closure device through a shared decision making conversation with the patient.  I demonstrated procedural animation to the patient and his wife today.  We reviewed each step of the procedure and discussed potential complications.  We reviewed expected recovery.  We had specific discussion about the anticoagulation strategy around left atrial appendage occlusion.  While anticoagulant therapy has historically been indicated, the FDA has approved dual antiplatelet therapy as well.  The patient has tolerated clopidogrel and antiplatelet therapy better than he has anticoagulation and he has even had significant bleeding on low-dose apixaban.  I do not think he will tolerate this and the preferred approach would be aspirin and clopidogrel and his particular situation.  I may start him back on clopidogrel alone to make sure that he tolerates this.  I will order a gated cardiac CTA to assess his left atrial appendage anatomy and suitability for watchman implantation.  Once his CTA is resulted, we will be in touch about scheduling him for watchman implantation if he has suitable anatomy.  In the meantime, we will check a CBC and metabolic panel today.  I will also refer him back to Dr. Trula Slade for reassessment of his right leg claudication symptoms per the patient's request.  Current medicines are reviewed at length with the patient today.   The  patient does not have concerns regarding his medicines.  The following changes were  made today:  Add clopidogrel 75 mg daily  Labs/ tests ordered today include:  Orders Placed This Encounter  Procedures   CT CARDIAC MORPH/PULM VEIN W/CM&W/O CA SCORE   CBC   Basic metabolic panel     Signed, [name], MD 05/31/2022  9:31 AM     [facility] [address] [place] [address] [phone] (office) [phone] (fax)

## 2022-06-02 ENCOUNTER — Encounter: Payer: Self-pay | Admitting: Urology

## 2022-06-02 ENCOUNTER — Telehealth: Payer: Self-pay | Admitting: Family Medicine

## 2022-06-02 DIAGNOSIS — D649 Anemia, unspecified: Secondary | ICD-10-CM

## 2022-06-02 NOTE — Telephone Encounter (Signed)
LMTCB

## 2022-06-02 NOTE — Telephone Encounter (Signed)
Please call pt.  I talked with cardiology about plavix restart.  Would recheck CBC in about 2 weeks from the start of plavix.  I put in the order.  Needs a lab visit.  Thanks.

## 2022-06-02 NOTE — Telephone Encounter (Signed)
Patient called to return a call.

## 2022-06-02 NOTE — Telephone Encounter (Signed)
Spoke with patient and schedule labs for 06/14/22 at 9:45 am.

## 2022-06-09 ENCOUNTER — Telehealth: Payer: Self-pay

## 2022-06-09 DIAGNOSIS — I739 Peripheral vascular disease, unspecified: Secondary | ICD-10-CM

## 2022-06-09 DIAGNOSIS — I70213 Atherosclerosis of native arteries of extremities with intermittent claudication, bilateral legs: Secondary | ICD-10-CM

## 2022-06-09 NOTE — Telephone Encounter (Signed)
Pt's wife, [name], called stating that the pt was having problems walking. She claims that she had spoken to someone at this office 2 weeks ago and never got a call back.   Reviewed pt's chart, returned call for clarification, two identifiers used. Nothing noted in chart regarding earlier call. She states that pt gets tightness in his calves when walking and has to stop before he can ambulate more. Pt denies any pain, swelling, discoloration, or cold extremities. Appts were scheduled from recall for 6 month f/u from June 2023. Confirmed understanding.

## 2022-06-14 ENCOUNTER — Other Ambulatory Visit (INDEPENDENT_AMBULATORY_CARE_PROVIDER_SITE_OTHER): Payer: Medicare Other

## 2022-06-14 ENCOUNTER — Ambulatory Visit
Admission: RE | Admit: 2022-06-14 | Discharge: 2022-06-14 | Disposition: A | Discharge status: Home / Self Care | Payer: Medicare Other | Source: Ambulatory Visit | Attending: Family Medicine | Admitting: Family Medicine

## 2022-06-14 ENCOUNTER — Telehealth (HOSPITAL_COMMUNITY): Payer: Self-pay | Admitting: Emergency Medicine

## 2022-06-14 DIAGNOSIS — D649 Anemia, unspecified: Secondary | ICD-10-CM | POA: Diagnosis not present

## 2022-06-14 DIAGNOSIS — K8020 Calculus of gallbladder without cholecystitis without obstruction: Secondary | ICD-10-CM | POA: Diagnosis not present

## 2022-06-14 DIAGNOSIS — K769 Liver disease, unspecified: Secondary | ICD-10-CM | POA: Diagnosis not present

## 2022-06-14 DIAGNOSIS — K7689 Other specified diseases of liver: Secondary | ICD-10-CM | POA: Diagnosis not present

## 2022-06-14 LAB — CBC WITH DIFFERENTIAL/PLATELET
Basophils Absolute: 0.1 10*3/uL (ref 0.0–0.1)
Basophils Relative: 0.9 % (ref 0.0–3.0)
Eosinophils Absolute: 0.2 10*3/uL (ref 0.0–0.7)
Eosinophils Relative: 1.4 % (ref 0.0–5.0)
HCT: 35.2 % — ABNORMAL LOW (ref 39.0–52.0)
Hemoglobin: 11.8 g/dL — ABNORMAL LOW (ref 13.0–17.0)
Lymphocytes Relative: 13.8 % (ref 12.0–46.0)
Lymphs Abs: 1.5 10*3/uL (ref 0.7–4.0)
MCHC: 33.5 g/dL (ref 30.0–36.0)
MCV: 96.3 fl (ref 78.0–100.0)
Monocytes Absolute: 0.8 10*3/uL (ref 0.1–1.0)
Monocytes Relative: 7.2 % (ref 3.0–12.0)
Neutro Abs: 8.3 10*3/uL — ABNORMAL HIGH (ref 1.4–7.7)
Neutrophils Relative %: 76.7 % (ref 43.0–77.0)
Platelets: 222 10*3/uL (ref 150.0–400.0)
RBC: 3.66 Mil/uL — ABNORMAL LOW (ref 4.22–5.81)
RDW: 16.3 % — ABNORMAL HIGH (ref 11.5–15.5)
WBC: 10.9 10*3/uL — ABNORMAL HIGH (ref 4.0–10.5)

## 2022-06-14 NOTE — Telephone Encounter (Signed)
Reaching out to patient to offer assistance regarding upcoming cardiac imaging study; pt verbalizes understanding of appt date/time, parking situation and where to check in, pre-test NPO status and medications ordered, and verified current allergies; name and call back number provided for further questions should they arise Ryan Bond RN [place] [facility] [phone] office [phone] cell  Arrival 930 w/c entrance Daily meds per usual No caffeine

## 2022-06-15 ENCOUNTER — Telehealth: Payer: Self-pay | Admitting: Cardiovascular Disease

## 2022-06-15 NOTE — Telephone Encounter (Signed)
Patient's wife called and said that patient took the shingles shot and has a CT coming up. Wants to make sure that everything is ok with that or should he postpone the CT

## 2022-06-15 NOTE — Telephone Encounter (Signed)
Patient's wife wanted to make sure there is not going to be a problem because he had his shingles vaccine yesterday.  She said he is feeling just fine and went to work today but she didn't want to come and then be told he couldn't have the scan.  I assured her it will be fine but that I would pass the information along to our navigators.

## 2022-06-16 ENCOUNTER — Ambulatory Visit (HOSPITAL_COMMUNITY)
Admission: RE | Admit: 2022-06-16 | Discharge: 2022-06-16 | Disposition: A | Discharge status: Home / Self Care | Payer: Medicare Other | Source: Ambulatory Visit | Attending: Cardiovascular Disease | Admitting: Cardiovascular Disease

## 2022-06-16 DIAGNOSIS — I4891 Unspecified atrial fibrillation: Secondary | ICD-10-CM | POA: Insufficient documentation

## 2022-06-16 MED ORDER — IOHEXOL 350 MG/ML SOLN
100.0000 mL | Freq: Once | INTRAVENOUS | Status: AC | PRN
  Administered 2022-06-16: 100 mL via INTRAVENOUS

## 2022-06-22 ENCOUNTER — Telehealth: Payer: Self-pay

## 2022-06-22 MED ORDER — ASPIRIN 81 MG PO TBEC: 81.0000 mg | DELAYED_RELEASE_TABLET | Freq: Every day | ORAL | 3 refills | Status: DC

## 2022-06-22 NOTE — Telephone Encounter (Signed)
-----   Message from [name], MD sent at 06/18/2022 12:47 PM EDT ----- Anatomy appears favorable for Watchman FLX implantation. OK to proceed with scheduling. thanks

## 2022-06-22 NOTE — Telephone Encounter (Signed)
Discussed with Dr. Burt Knack. The patient will need to start ASA 81 mg daily if tolerating Plavix and wants to proceed with Watchman implant.   Reviewed CT results with patient who verbalized understanding.   The patient states he is having no issues with Plavix. He wishes to proceed with LAAO 06/29/2022. Instructed him to START ASA 81 mg daily tomorrow.  Will send instructions via MyChart and call him Monday to review together.  He was grateful for call and agrees with plan.

## 2022-06-26 ENCOUNTER — Other Ambulatory Visit: Payer: Medicare Other

## 2022-06-27 ENCOUNTER — Telehealth: Payer: Self-pay

## 2022-06-27 ENCOUNTER — Other Ambulatory Visit: Payer: Self-pay

## 2022-06-27 DIAGNOSIS — Z8719 Personal history of other diseases of the digestive system: Secondary | ICD-10-CM

## 2022-06-27 DIAGNOSIS — D649 Anemia, unspecified: Secondary | ICD-10-CM

## 2022-06-27 DIAGNOSIS — Z85828 Personal history of other malignant neoplasm of skin: Secondary | ICD-10-CM | POA: Diagnosis not present

## 2022-06-27 DIAGNOSIS — I480 Paroxysmal atrial fibrillation: Secondary | ICD-10-CM

## 2022-06-27 DIAGNOSIS — L570 Actinic keratosis: Secondary | ICD-10-CM | POA: Diagnosis not present

## 2022-06-27 DIAGNOSIS — D2272 Melanocytic nevi of left lower limb, including hip: Secondary | ICD-10-CM | POA: Diagnosis not present

## 2022-06-27 DIAGNOSIS — D2262 Melanocytic nevi of left upper limb, including shoulder: Secondary | ICD-10-CM | POA: Diagnosis not present

## 2022-06-27 DIAGNOSIS — D2261 Melanocytic nevi of right upper limb, including shoulder: Secondary | ICD-10-CM | POA: Diagnosis not present

## 2022-06-27 NOTE — Telephone Encounter (Signed)
Confirmed procedure date of 06/29/2022. Confirmed new arrival time of 5:30AM for procedure time at 7:30AM. Reviewed pre-procedure instructions with patient. The patient understands to call if questions/concerns arise prior to procedure.

## 2022-06-28 ENCOUNTER — Encounter (HOSPITAL_COMMUNITY): Payer: Medicare Other

## 2022-06-28 ENCOUNTER — Ambulatory Visit: Payer: Medicare Other

## 2022-06-29 ENCOUNTER — Other Ambulatory Visit: Payer: Self-pay

## 2022-06-29 ENCOUNTER — Encounter (HOSPITAL_COMMUNITY)
Admission: RE | Disposition: A | Discharge status: Home / Self Care | Payer: Self-pay | Source: Home / Self Care | Attending: Cardiovascular Disease

## 2022-06-29 ENCOUNTER — Inpatient Hospital Stay (HOSPITAL_COMMUNITY): Payer: Medicare Other | Admitting: Certified Registered"

## 2022-06-29 ENCOUNTER — Inpatient Hospital Stay (HOSPITAL_COMMUNITY)
Admission: RE | Admit: 2022-06-29 | Discharge: 2022-06-30 | DRG: 274 | Disposition: A | Discharge status: Home / Self Care | Payer: Medicare Other | Attending: Cardiovascular Disease | Admitting: Cardiovascular Disease

## 2022-06-29 ENCOUNTER — Inpatient Hospital Stay (HOSPITAL_COMMUNITY): Payer: Medicare Other

## 2022-06-29 ENCOUNTER — Encounter (HOSPITAL_COMMUNITY): Payer: Self-pay | Admitting: Cardiovascular Disease

## 2022-06-29 DIAGNOSIS — K5520 Angiodysplasia of colon without hemorrhage: Secondary | ICD-10-CM | POA: Diagnosis present

## 2022-06-29 DIAGNOSIS — I480 Paroxysmal atrial fibrillation: Principal | ICD-10-CM | POA: Diagnosis present

## 2022-06-29 DIAGNOSIS — Z8719 Personal history of other diseases of the digestive system: Secondary | ICD-10-CM

## 2022-06-29 DIAGNOSIS — Z833 Family history of diabetes mellitus: Secondary | ICD-10-CM | POA: Diagnosis not present

## 2022-06-29 DIAGNOSIS — I2510 Atherosclerotic heart disease of native coronary artery without angina pectoris: Secondary | ICD-10-CM | POA: Diagnosis present

## 2022-06-29 DIAGNOSIS — Z951 Presence of aortocoronary bypass graft: Secondary | ICD-10-CM | POA: Diagnosis not present

## 2022-06-29 DIAGNOSIS — Z881 Allergy status to other antibiotic agents status: Secondary | ICD-10-CM | POA: Diagnosis not present

## 2022-06-29 DIAGNOSIS — K219 Gastro-esophageal reflux disease without esophagitis: Secondary | ICD-10-CM | POA: Diagnosis present

## 2022-06-29 DIAGNOSIS — Z8616 Personal history of COVID-19: Secondary | ICD-10-CM | POA: Diagnosis not present

## 2022-06-29 DIAGNOSIS — Z95818 Presence of other cardiac implants and grafts: Principal | ICD-10-CM

## 2022-06-29 DIAGNOSIS — E785 Hyperlipidemia, unspecified: Secondary | ICD-10-CM | POA: Diagnosis present

## 2022-06-29 DIAGNOSIS — Z886 Allergy status to analgesic agent status: Secondary | ICD-10-CM | POA: Diagnosis not present

## 2022-06-29 DIAGNOSIS — I10 Essential (primary) hypertension: Secondary | ICD-10-CM | POA: Diagnosis present

## 2022-06-29 DIAGNOSIS — Z79890 Hormone replacement therapy (postmenopausal): Secondary | ICD-10-CM

## 2022-06-29 DIAGNOSIS — E890 Postprocedural hypothyroidism: Secondary | ICD-10-CM | POA: Diagnosis present

## 2022-06-29 DIAGNOSIS — Z880 Allergy status to penicillin: Secondary | ICD-10-CM | POA: Diagnosis not present

## 2022-06-29 DIAGNOSIS — Z006 Encounter for examination for normal comparison and control in clinical research program: Secondary | ICD-10-CM

## 2022-06-29 DIAGNOSIS — E1151 Type 2 diabetes mellitus with diabetic peripheral angiopathy without gangrene: Secondary | ICD-10-CM | POA: Diagnosis present

## 2022-06-29 DIAGNOSIS — I25119 Atherosclerotic heart disease of native coronary artery with unspecified angina pectoris: Secondary | ICD-10-CM | POA: Diagnosis not present

## 2022-06-29 DIAGNOSIS — D472 Monoclonal gammopathy: Secondary | ICD-10-CM | POA: Diagnosis present

## 2022-06-29 DIAGNOSIS — Z87891 Personal history of nicotine dependence: Secondary | ICD-10-CM

## 2022-06-29 DIAGNOSIS — Z95828 Presence of other vascular implants and grafts: Secondary | ICD-10-CM

## 2022-06-29 DIAGNOSIS — I4891 Unspecified atrial fibrillation: Secondary | ICD-10-CM | POA: Diagnosis present

## 2022-06-29 DIAGNOSIS — I739 Peripheral vascular disease, unspecified: Secondary | ICD-10-CM | POA: Diagnosis present

## 2022-06-29 DIAGNOSIS — Z79899 Other long term (current) drug therapy: Secondary | ICD-10-CM

## 2022-06-29 DIAGNOSIS — Z7902 Long term (current) use of antithrombotics/antiplatelets: Secondary | ICD-10-CM | POA: Diagnosis not present

## 2022-06-29 DIAGNOSIS — N183 Chronic kidney disease, stage 3 unspecified: Secondary | ICD-10-CM | POA: Diagnosis present

## 2022-06-29 DIAGNOSIS — Z888 Allergy status to other drugs, medicaments and biological substances status: Secondary | ICD-10-CM | POA: Diagnosis not present

## 2022-06-29 DIAGNOSIS — I351 Nonrheumatic aortic (valve) insufficiency: Secondary | ICD-10-CM | POA: Diagnosis not present

## 2022-06-29 DIAGNOSIS — D649 Anemia, unspecified: Secondary | ICD-10-CM

## 2022-06-29 DIAGNOSIS — Z01818 Encounter for other preprocedural examination: Secondary | ICD-10-CM | POA: Diagnosis not present

## 2022-06-29 HISTORY — PX: LEFT ATRIAL APPENDAGE OCCLUSION: EP1229

## 2022-06-29 HISTORY — PX: TEE WITHOUT CARDIOVERSION: SHX5443

## 2022-06-29 HISTORY — DX: Presence of other cardiac implants and grafts: Z95.818

## 2022-06-29 LAB — CREATININE, SERUM
Creatinine, Ser: 1.31 mg/dL — ABNORMAL HIGH (ref 0.61–1.24)
GFR, Estimated: 54 mL/min — ABNORMAL LOW (ref >60)

## 2022-06-29 LAB — CBC
HCT: 33.1 % — ABNORMAL LOW (ref 39.0–52.0)
Hemoglobin: 11.1 g/dL — ABNORMAL LOW (ref 13.0–17.0)
MCH: 31.8 pg (ref 26.0–34.0)
MCHC: 33.5 g/dL (ref 30.0–36.0)
MCV: 94.8 fL (ref 80.0–100.0)
Platelets: 190 10*3/uL (ref 150–400)
RBC: 3.49 MIL/uL — ABNORMAL LOW (ref 4.22–5.81)
RDW: 15.5 % (ref 11.5–15.5)
WBC: 14.4 10*3/uL — ABNORMAL HIGH (ref 4.0–10.5)
nRBC: 0 % (ref 0.0–0.2)

## 2022-06-29 LAB — POCT I-STAT, CHEM 8
BUN: 13 mg/dL (ref 8–23)
Calcium, Ion: 1.3 mmol/L (ref 1.15–1.40)
Chloride: 103 mmol/L (ref 98–111)
Creatinine, Ser: 1.1 mg/dL (ref 0.61–1.24)
Glucose, Bld: 120 mg/dL — ABNORMAL HIGH (ref 70–99)
HCT: 31 % — ABNORMAL LOW (ref 39.0–52.0)
Hemoglobin: 10.5 g/dL — ABNORMAL LOW (ref 13.0–17.0)
Potassium: 4 mmol/L (ref 3.5–5.1)
Sodium: 139 mmol/L (ref 135–145)
TCO2: 25 mmol/L (ref 22–32)

## 2022-06-29 LAB — SURGICAL PCR SCREEN
MRSA, PCR: NEGATIVE
Staphylococcus aureus: NEGATIVE

## 2022-06-29 LAB — ECHO TEE
AR max vel: 1.48 cm2
AV Area VTI: 1.49 cm2
AV Area mean vel: 1.4 cm2
AV Mean grad: 12 mmHg
AV Peak grad: 18.1 mmHg
Ao pk vel: 2.13 m/s

## 2022-06-29 LAB — TYPE AND SCREEN
ABO/RH(D): A NEG
Antibody Screen: NEGATIVE

## 2022-06-29 LAB — POCT ACTIVATED CLOTTING TIME
Activated Clotting Time: 269 seconds
Activated Clotting Time: 293 seconds

## 2022-06-29 SURGERY — LEFT ATRIAL APPENDAGE OCCLUSION
Anesthesia: General

## 2022-06-29 MED ORDER — EPHEDRINE SULFATE-NACL 50-0.9 MG/10ML-% IV SOSY
PREFILLED_SYRINGE | INTRAVENOUS | Status: DC | PRN
  Administered 2022-06-29: 5 mg via INTRAVENOUS
  Administered 2022-06-29 (×2): 10 mg via INTRAVENOUS

## 2022-06-29 MED ORDER — HEPARIN (PORCINE) IN NACL 2000-0.9 UNIT/L-% IV SOLN
INTRAVENOUS | Status: AC
  Filled 2022-06-29: qty 1000

## 2022-06-29 MED ORDER — TAMSULOSIN HCL 0.4 MG PO CAPS
0.4000 mg | ORAL_CAPSULE | Freq: Every day | ORAL | Status: DC
  Administered 2022-06-29 – 2022-06-30 (×2): 0.4 mg via ORAL
  Filled 2022-06-29 (×2): qty 1

## 2022-06-29 MED ORDER — SODIUM CHLORIDE 0.9% FLUSH
3.0000 mL | Freq: Two times a day (BID) | INTRAVENOUS | Status: DC
  Administered 2022-06-29: 3 mL via INTRAVENOUS

## 2022-06-29 MED ORDER — LACTATED RINGERS IV SOLN: INTRAVENOUS | Status: DC | PRN

## 2022-06-29 MED ORDER — METOPROLOL TARTRATE 25 MG PO TABS
25.0000 mg | ORAL_TABLET | Freq: Two times a day (BID) | ORAL | Status: DC
  Administered 2022-06-29 – 2022-06-30 (×3): 25 mg via ORAL
  Filled 2022-06-29 (×3): qty 1

## 2022-06-29 MED ORDER — ROCURONIUM BROMIDE 10 MG/ML (PF) SYRINGE
PREFILLED_SYRINGE | INTRAVENOUS | Status: DC | PRN
  Administered 2022-06-29: 50 mg via INTRAVENOUS

## 2022-06-29 MED ORDER — DEXAMETHASONE SODIUM PHOSPHATE 10 MG/ML IJ SOLN
INTRAMUSCULAR | Status: DC | PRN
  Administered 2022-06-29: 4 mg via INTRAVENOUS

## 2022-06-29 MED ORDER — HYDRALAZINE HCL 20 MG/ML IJ SOLN
10.0000 mg | INTRAMUSCULAR | Status: DC | PRN
  Administered 2022-06-29: 10 mg via INTRAVENOUS

## 2022-06-29 MED ORDER — SODIUM CHLORIDE 0.9 % IV SOLN: INTRAVENOUS | Status: DC

## 2022-06-29 MED ORDER — VANCOMYCIN HCL IN DEXTROSE 1-5 GM/200ML-% IV SOLN
INTRAVENOUS | Status: AC
  Administered 2022-06-29: 1000 mg via INTRAVENOUS
  Filled 2022-06-29: qty 200

## 2022-06-29 MED ORDER — PANTOPRAZOLE SODIUM 40 MG PO TBEC
40.0000 mg | DELAYED_RELEASE_TABLET | Freq: Every day | ORAL | Status: DC
  Administered 2022-06-29 – 2022-06-30 (×2): 40 mg via ORAL
  Filled 2022-06-29 (×2): qty 1

## 2022-06-29 MED ORDER — PHENYLEPHRINE 80 MCG/ML (10ML) SYRINGE FOR IV PUSH (FOR BLOOD PRESSURE SUPPORT)
PREFILLED_SYRINGE | INTRAVENOUS | Status: DC | PRN
  Administered 2022-06-29: 160 ug via INTRAVENOUS
  Administered 2022-06-29: 80 ug via INTRAVENOUS

## 2022-06-29 MED ORDER — IOHEXOL 350 MG/ML SOLN
INTRAVENOUS | Status: DC | PRN
  Administered 2022-06-29: 35 mL

## 2022-06-29 MED ORDER — HEPARIN SODIUM (PORCINE) 1000 UNIT/ML IJ SOLN
INTRAMUSCULAR | Status: DC | PRN
  Administered 2022-06-29: 9000 [IU] via INTRAVENOUS
  Administered 2022-06-29: 3000 [IU] via INTRAVENOUS

## 2022-06-29 MED ORDER — PHENYLEPHRINE HCL-NACL 20-0.9 MG/250ML-% IV SOLN
INTRAVENOUS | Status: DC | PRN
  Administered 2022-06-29: 25 ug/min via INTRAVENOUS

## 2022-06-29 MED ORDER — SODIUM CHLORIDE 0.9% FLUSH
3.0000 mL | INTRAVENOUS | Status: DC | PRN
  Administered 2022-06-29: 3 mL via INTRAVENOUS

## 2022-06-29 MED ORDER — ORAL CARE MOUTH RINSE: 15.0000 mL | OROMUCOSAL | Status: DC | PRN

## 2022-06-29 MED ORDER — ACETAMINOPHEN 325 MG PO TABS
650.0000 mg | ORAL_TABLET | ORAL | Status: DC | PRN
  Administered 2022-06-29: 650 mg via ORAL
  Filled 2022-06-29: qty 2

## 2022-06-29 MED ORDER — ONDANSETRON HCL 4 MG/2ML IJ SOLN
INTRAMUSCULAR | Status: DC | PRN
  Administered 2022-06-29: 4 mg via INTRAVENOUS

## 2022-06-29 MED ORDER — ONDANSETRON HCL 4 MG/2ML IJ SOLN: 4.0000 mg | Freq: Four times a day (QID) | INTRAMUSCULAR | Status: DC | PRN

## 2022-06-29 MED ORDER — LACTATED RINGERS IV SOLN: INTRAVENOUS | Status: DC

## 2022-06-29 MED ORDER — CLOPIDOGREL BISULFATE 75 MG PO TABS
75.0000 mg | ORAL_TABLET | Freq: Every day | ORAL | Status: DC
  Administered 2022-06-29 – 2022-06-30 (×2): 75 mg via ORAL
  Filled 2022-06-29 (×2): qty 1

## 2022-06-29 MED ORDER — CHLORHEXIDINE GLUCONATE 0.12 % MT SOLN
OROMUCOSAL | Status: AC
  Administered 2022-06-29: 15 mL via OROMUCOSAL
  Filled 2022-06-29: qty 15

## 2022-06-29 MED ORDER — HEPARIN (PORCINE) IN NACL 1000-0.9 UT/500ML-% IV SOLN
INTRAVENOUS | Status: DC | PRN
  Administered 2022-06-29: 500 mL

## 2022-06-29 MED ORDER — PROPOFOL 10 MG/ML IV BOLUS
INTRAVENOUS | Status: DC | PRN
  Administered 2022-06-29: 100 mg via INTRAVENOUS

## 2022-06-29 MED ORDER — HYDRALAZINE HCL 20 MG/ML IJ SOLN
INTRAMUSCULAR | Status: AC
  Filled 2022-06-29: qty 1

## 2022-06-29 MED ORDER — PROTAMINE SULFATE 10 MG/ML IV SOLN
INTRAVENOUS | Status: DC | PRN
  Administered 2022-06-29: 30 mg via INTRAVENOUS

## 2022-06-29 MED ORDER — ASPIRIN 81 MG PO TBEC
81.0000 mg | DELAYED_RELEASE_TABLET | Freq: Every day | ORAL | Status: DC
  Administered 2022-06-29 – 2022-06-30 (×2): 81 mg via ORAL
  Filled 2022-06-29 (×2): qty 1

## 2022-06-29 MED ORDER — FENTANYL CITRATE (PF) 100 MCG/2ML IJ SOLN
INTRAMUSCULAR | Status: DC | PRN
  Administered 2022-06-29: 50 ug via INTRAVENOUS

## 2022-06-29 MED ORDER — LOSARTAN POTASSIUM 25 MG PO TABS
25.0000 mg | ORAL_TABLET | Freq: Every day | ORAL | Status: DC
  Administered 2022-06-29: 25 mg via ORAL
  Filled 2022-06-29: qty 1

## 2022-06-29 MED ORDER — LIDOCAINE 2% (20 MG/ML) 5 ML SYRINGE
INTRAMUSCULAR | Status: DC | PRN
  Administered 2022-06-29: 60 mg via INTRAVENOUS

## 2022-06-29 MED ORDER — SUGAMMADEX SODIUM 200 MG/2ML IV SOLN
INTRAVENOUS | Status: DC | PRN
  Administered 2022-06-29: 140 mg via INTRAVENOUS

## 2022-06-29 MED ORDER — SODIUM CHLORIDE 0.9 % IV SOLN: 250.0000 mL | INTRAVENOUS | Status: DC | PRN

## 2022-06-29 MED ORDER — VANCOMYCIN HCL IN DEXTROSE 1-5 GM/200ML-% IV SOLN: 1000.0000 mg | INTRAVENOUS | Status: AC

## 2022-06-29 MED ORDER — HEPARIN (PORCINE) IN NACL 1000-0.9 UT/500ML-% IV SOLN
INTRAVENOUS | Status: AC
  Filled 2022-06-29: qty 500

## 2022-06-29 MED ORDER — CHLORHEXIDINE GLUCONATE 0.12 % MT SOLN
15.0000 mL | Freq: Once | OROMUCOSAL | Status: AC
  Filled 2022-06-29: qty 15

## 2022-06-29 MED ORDER — HEPARIN (PORCINE) IN NACL 2000-0.9 UNIT/L-% IV SOLN
INTRAVENOUS | Status: DC | PRN
  Administered 2022-06-29: 1000 mL

## 2022-06-29 MED ORDER — HEPARIN SODIUM (PORCINE) 5000 UNIT/ML IJ SOLN
5000.0000 [IU] | Freq: Three times a day (TID) | INTRAMUSCULAR | Status: DC
  Administered 2022-06-29 – 2022-06-30 (×3): 5000 [IU] via SUBCUTANEOUS
  Filled 2022-06-29 (×3): qty 1

## 2022-06-29 MED ORDER — LEVOTHYROXINE SODIUM 75 MCG PO TABS
75.0000 ug | ORAL_TABLET | Freq: Every day | ORAL | Status: DC
  Administered 2022-06-30: 75 ug via ORAL
  Filled 2022-06-29 (×2): qty 1

## 2022-06-29 SURGICAL SUPPLY — 19 items
CATH INFINITI 5FR ANG PIGTAIL (CATHETERS) IMPLANT
CLOSURE PERCLOSE PROSTYLE (VASCULAR PRODUCTS) IMPLANT
DEVICE WATCHMAN FLX PROC (KITS) IMPLANT
DILATOR VESSEL 38 20CM 11FR (INTRODUCER) IMPLANT
KIT HEART LEFT (KITS) ×1 IMPLANT
KIT SHEA VERSACROSS LAAC CONNE (KITS) IMPLANT
PACK CARDIAC CATHETERIZATION (CUSTOM PROCEDURE TRAY) ×1 IMPLANT
PAD DEFIB RADIO PHYSIO CONN (PAD) ×1 IMPLANT
SHEATH PERFORMER 16FR 30 (SHEATH) IMPLANT
SHEATH PINNACLE 8F 10CM (SHEATH) IMPLANT
SHEATH PROBE COVER 6X72 (BAG) ×1 IMPLANT
SHIELD RADPAD SCOOP 12X17 (MISCELLANEOUS) ×1 IMPLANT
SYS WATCHMAN FXD DBL (SHEATH) ×1
SYSTEM WATCHMAN FXD DBL (SHEATH) IMPLANT
TRANSDUCER W/STOPCOCK (MISCELLANEOUS) ×1 IMPLANT
TUBING CIL FLEX 10 FLL-RA (TUBING) ×1 IMPLANT
WATCHMAN FLX 27 (Prosthesis & Implant Heart) IMPLANT
WATCHMAN FLX PROCEDURE DEVICE (KITS) ×1 IMPLANT
WATCHMAN PROCED TRUSEAL ACCESS (SHEATH) IMPLANT

## 2022-06-29 NOTE — Anesthesia Procedure Notes (Signed)
Procedure Name: Intubation Date/Time: 06/29/2022 8:01 AM  Performed by: Barrington Ellison, CRNAPre-anesthesia Checklist: Patient identified, Emergency Drugs available, Suction available and Patient being monitored Patient Re-evaluated:Patient Re-evaluated prior to induction Oxygen Delivery Method: Circle System Utilized Preoxygenation: Pre-oxygenation with 100% oxygen Induction Type: IV induction Ventilation: Mask ventilation without difficulty Laryngoscope Size: Mac and 4 Grade View: Grade I Tube type: Oral Tube size: 7.5 mm Number of attempts: 1 Airway Equipment and Method: Stylet and Oral airway Placement Confirmation: ETT inserted through vocal cords under direct vision, positive ETCO2 and breath sounds checked- equal and bilateral Secured at: 21 cm Tube secured with: Tape Dental Injury: Teeth and Oropharynx as per pre-operative assessment

## 2022-06-29 NOTE — Anesthesia Preprocedure Evaluation (Signed)
Anesthesia Evaluation  Patient identified by MRN, date of birth, ID band Patient awake    Reviewed: Allergy & Precautions, NPO status , Patient's Chart, lab work & pertinent test results  Airway Mallampati: II       Dental   Pulmonary shortness of breath, pneumonia, former smoker,    breath sounds clear to auscultation       Cardiovascular hypertension, + angina + CAD and + Peripheral Vascular Disease  + dysrhythmias + Valvular Problems/Murmurs  Rhythm:Irregular Rate:Normal     Neuro/Psych    GI/Hepatic PUD, GERD  ,  Endo/Other  Hypothyroidism   Renal/GU Renal disease     Musculoskeletal  (+) Arthritis ,   Abdominal   Peds  Hematology   Anesthesia Other Findings   Reproductive/Obstetrics                             Anesthesia Physical Anesthesia Plan  ASA: 3  Anesthesia Plan: General   Post-op Pain Management:    Induction: Intravenous  PONV Risk Score and Plan: 3 and Ondansetron, Dexamethasone and Midazolam  Airway Management Planned: Oral ETT  Additional Equipment: Arterial line  Intra-op Plan:   Post-operative Plan: Possible Post-op intubation/ventilation  Informed Consent: I have reviewed the patients History and Physical, chart, labs and discussed the procedure including the risks, benefits and alternatives for the proposed anesthesia with the patient or authorized representative who has indicated his/her understanding and acceptance.     Dental advisory given  Plan Discussed with: CRNA and Anesthesiologist  Anesthesia Plan Comments:         Anesthesia Quick Evaluation

## 2022-06-29 NOTE — Discharge Summary (Addendum)
[facility]    Patient ID: Ryan Pittman,  MRN: [id number], DOB/AGE: [birth year] 81 y.o.  Admit date: 06/29/2022 Discharge date: 06/30/2022  Primary Care Physician: Tonia Ghent, MD  Primary Cardiologist: [name], MD  Electrophysiologist: None  Primary Discharge Diagnosis:  Paroxysmal Atrial Fibrillation Poor candidacy for long term anticoagulation due to h/o GI bleeding  Secondary Discharge Diagnosis:  -PAD (L fempop bypass and R SFA stenting) -CAD s/p CABG -HTN -hypothyroidism -Small bowel AVMs  Procedures This Admission:  Transeptal Puncture Intra-procedural TEE which showed no LAA thrombus Left atrial appendage occlusive device placement on 06/29/22 by Dr. Burt Knack.   This study demonstrated:    Successful left atrial appendage occlusion with a 27 mm Watchman FLX device   Recommend: continue ASA/clopidogrel x 6 months as tolerated, TEE in 6 weeks  Brief HPI: Ryan Pittman is a 81 y.o. male with a history of PAD (L fempop bypass and R SFA stenting), CAD s/p CABG, HTN, hypothyroidism, small bowel AVMs, PAF with recurrent GI bleeding requiring transfusions on Eliquis (therefore discontinued).   Ryan Pittman has been evaluated by their referring physician and was felt to be a poor candidate for long term [place] due to GI bleeding. He therefore was referred to Dr. Burt Knack for the evaluation of possible LAAO. The patient was hospitalized in 10/2021 with GI bleeding and hemoglobin down to about 7 mg/dL.  He received 2 units of packed red blood cell transfusion and his hemoglobin improved to about 9 mg/dL. 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  On evaluation he was noted to have trouble tolerating apixaban and most recently had been on reduced dose apixaban 2.5 mg twice daily. His stools were guaiac positive even on low-dose  apixaban and he has completely discontinued this. He is also discontinued antiplatelet therapy as he was previously on clopidogrel for peripheral arterial disease with history of left femoropopliteal bypass and right SFA stenting.    He underwent CT imaging which showed anatomy suitable for LAAO with Watchman scheduled for 06/29/22.    Hospital Course:  The patient was admitted and underwent left atrial appendage occlusive device placement with Watchman FLX 23m device.  He was monitored on telemetry overnight which demonstrated NSR. Groin site has been without evidence of bleeding or hematoma. Wound care and restrictions were reviewed with the patient. The patient has been scheduled for post procedure follow up with JKathyrn Drown NP in approximately 1 month. Medication plan will be to restart ASA '81mg'$  and Plavix '75mg'$  and continue DAPT through 6 months of therapy (12/28/21). A repeat TEE has been scheduled for 08/11/22 with Dr. CSallyanne Kusterto ensure device seal with no thrombus. He will require dental SBE for 6 months after implant. Given PCN allergy, will RX azithromycin at follow up.   Post op BP noted to be elevated. He was restarted on home antihypertensives with improvement. BP remains above goal with SBPs in the 150's. Will increase losartan from '25mg'$  to '50mg'$  QD. Will continue to follow in the OP setting.   Physical Exam: Vitals:   06/29/22 1900 06/29/22 2325 06/30/22 0320 06/30/22 0734  BP: 131/68 129/72 (!) 154/66 (!) 163/58  Pulse: 75 74 68 65  Resp: (!) '21 19 15 18  '$ Temp: 97.8 F (36.6 C) 98.3 F (36.8 C) (!) 97.5 F (36.4 C) 97.8 F (36.6 C)  TempSrc: Oral Oral Oral Oral  SpO2:  98% 97% 98% 98%  Weight:      Height:       General: Well developed, well nourished, NAD Lungs:Clear to ausculation bilaterally. No wheezes, rales, or rhonchi. Breathing is unlabored. Cardiovascular: RRR with S1 S2. No murmurs Extremities: No edema. Groin site stable with no evidence of bleeding.  Neuro:  Alert and oriented. No focal deficits. No facial asymmetry. MAE spontaneously. Psych: Responds to questions appropriately with normal affect.    Labs:   Lab Results  Component Value Date   WBC 14.4 (H) 06/29/2022   HGB 11.1 (L) 06/29/2022   HCT 33.1 (L) 06/29/2022   MCV 94.8 06/29/2022   PLT 190 06/29/2022    Recent Labs  Lab 06/30/22 0033  NA 138  K 4.4  CL 109  CO2 22  BUN 20  CREATININE 1.53*  CALCIUM 8.7*  GLUCOSE 125*   Discharge Medications:  Allergies as of 06/30/2022       Reactions   Penicillins Itching, Other (See Comments)   PATIENT HAS HAD A PCN REACTION WITH IMMEDIATE RASH, FACIAL/TONGUE/THROAT SWELLING, SOB, OR LIGHTHEADEDNESS WITH HYPOTENSION:  #  #  YES  #  #  Has patient had a PCN reaction causing severe rash involving mucus membranes or skin necrosis: No Has patient had a PCN reaction that required hospitalization: No Has patient had a PCN reaction occurring within the last 10 years: No If all of the above answers are "NO", then may proceed with Cephalosporin use.   Ace Inhibitors Cough   Aspirin Other (See Comments)   H/o GI bleed   Celebrex [celecoxib] Other (See Comments)   GI bleed   Lipitor [atorvastatin] Other (See Comments)   myalgias        Medication List     TAKE these medications    acetaminophen 500 MG tablet Commonly known as: TYLENOL Take 1,000 mg by mouth every 6 (six) hours as needed for moderate pain or headache.   aspirin EC 81 MG tablet Take 1 tablet (81 mg total) by mouth daily. Swallow whole.   clopidogrel 75 MG tablet Commonly known as: PLAVIX Take 1 tablet (75 mg total) by mouth daily.   DIALYVITE VITAMIN D 5000 PO Take 5,000 Units by mouth daily.   ferrous sulfate 325 (65 FE) MG EC tablet Take 1 tablet (325 mg total) by mouth daily.   hydrOXYzine 10 MG tablet Commonly known as: ATARAX ONE-HALF TO ONE TABLET 3 TIMES DAILY AS NEEDED FOR ANXIETY   levothyroxine 75 MCG tablet Commonly known as:  SYNTHROID TAKE 1 TABLET BY MOUTH DAILY EXCEPT ON THURSDAYS TAKE 1.5 TABLETS   losartan 50 MG tablet Commonly known as: COZAAR Take 1 tablet (50 mg total) by mouth daily. What changed:  medication strength how much to take   metoprolol tartrate 25 MG tablet Commonly known as: LOPRESSOR Take 1 tablet (25 mg total) by mouth 2 (two) times daily.   Multivitamin Men 50+ Tabs Take 1 tablet by mouth daily.   pantoprazole 40 MG tablet Commonly known as: PROTONIX Take 1 tablet (40 mg total) by mouth daily.   pramipexole 1 MG tablet Commonly known as: MIRAPEX Take 1 mg by mouth daily as needed (restless leg).   Repatha SureClick 254 MG/ML Soaj Generic drug: Evolocumab INJECT '140MG'$  EVERY 14 DAYS What changed: See the new instructions.   tamsulosin 0.4 MG Caps capsule Commonly known as: FLOMAX Take 1 capsule (0.4 mg total) by mouth daily.        Disposition:  Home  Discharge Instructions     Call MD for:  difficulty breathing, headache or visual disturbances   Complete by: As directed    Call MD for:  extreme fatigue   Complete by: As directed    Call MD for:  hives   Complete by: As directed    Call MD for:  persistant dizziness or light-headedness   Complete by: As directed    Call MD for:  persistant nausea and vomiting   Complete by: As directed    Call MD for:  redness, tenderness, or signs of infection (pain, swelling, redness, odor or green/yellow discharge around incision site)   Complete by: As directed    Call MD for:  severe uncontrolled pain   Complete by: As directed    Call MD for:  temperature >100.4   Complete by: As directed    Diet - low sodium heart healthy   Complete by: As directed    Discharge instructions   Complete by: As directed    [facility] Procedure, Care After  Procedure MD: Dr. Armandina Stammer Clinical Coordinator: Lenice Llamas, RN  This sheet gives you information about how to care for yourself after your procedure. Your health care  provider may also give you more specific instructions. If you have problems or questions, contact your health care provider.  What can I expect after the procedure? After the procedure, it is common to have: Bruising around your puncture site. Tenderness around your puncture site. Tiredness (fatigue).  Medication instructions It is very important to continue to take your blood thinner as directed by your doctor after the Watchman procedure. Call your procedure doctor's office with question or concerns. If you are on Coumadin (warfarin), you will have your INR checked the week after your procedure, with a goal INR of 2.0 - 3.0. Please follow your medication instructions on your discharge summary. Only take the medications listed on your discharge paperwork.  Follow up You will be seen in 1 month after your procedure You will have another TEE (Transesophageal Echocardiogram), scheduled for 08/11/22 to check your device You will follow up the MD/APP who performed your procedure 6 months after your procedure The Watchman Clinical Coordinator will check in with you from time to time, including 1 and 2 years after your procedure.    Follow these instructions at home: Puncture site care  Follow instructions from your health care provider about how to take care of your puncture site. Make sure you: If present, leave stitches (sutures), skin glue, or adhesive strips in place.  If a large square bandage is present, this may be removed 24 hours after surgery.  Check your puncture site every day for signs of infection. Check for: Redness, swelling, or pain. Fluid or blood. If your puncture site starts to bleed, lie down on your back, apply firm pressure to the area, and contact your health care provider. Warmth. Pus or a bad smell. Driving Do not drive yourself home if you received sedation Do not drive for at least 4 days after your procedure or however long your health care provider recommends.  (Do not resume driving if you have previously been instructed not to drive for other health reasons.) Do not spend greater than 1 hour at a time in a car for the first 3 days. Stop and take a break with a 5 minute walk at least every hour.  Do not drive or use heavy machinery while taking prescription pain medicine.  Activity Avoid activities that take  a lot of effort, including exercise, for at least 7 days after your procedure. For the first 3 days, avoid sitting for longer than one hour at a time.  Avoid alcoholic beverages, signing paperwork, or participating in legal proceedings for 24 hours after receiving sedation Do not lift anything that is heavier than 10 lb (4.5 kg) for one week.  No sexual activity for 1 week.  Return to your normal activities as told by your health care provider. Ask your health care provider what activities are safe for you. General instructions Take over-the-counter and prescription medicines only as told by your health care provider. Do not use any products that contain nicotine or tobacco, such as cigarettes and e-cigarettes. If you need help quitting, ask your health care provider. You may shower after 24 hours, but Do not take baths, swim, or use a hot tub for 1 week.  Do not drink alcohol for 24 hours after your procedure. Keep all follow-up visits as told by your health care provider. This is important. Dental Work: You will require antibiotics prior to any dental work, including cleanings, for 6 months after your Watchman implantation to help protect you from infection. After 6 months, antibiotics are no longer required. Contact a health care provider if: You have redness, mild swelling, or pain around your puncture site. You have soreness in your throat or at your puncture site that does not improve after several days You have fluid or blood coming from your puncture site that stops after applying firm pressure to the area. Your puncture site feels warm  to the touch. You have pus or a bad smell coming from your puncture site. You have a fever. You have chest pain or discomfort that spreads to your neck, jaw, or arm. You are sweating a lot. You feel nauseous. You have a fast or irregular heartbeat. You have shortness of breath. You are dizzy or light-headed and feel the need to lie down. You have pain or numbness in the arm or leg closest to your puncture site. Get help right away if: Your puncture site suddenly swells. Your puncture site is bleeding and the bleeding does not stop after applying firm pressure to the area. These symptoms may represent a serious problem that is an emergency. Do not wait to see if the symptoms will go away. Get medical help right away. Call your local emergency services (911 in the [place] Do not drive yourself to the hospital. Summary After the procedure, it is normal to have bruising and tenderness at the puncture site in your groin, neck, or forearm. Check your puncture site every day for signs of infection. Get help right away if your puncture site is bleeding and the bleeding does not stop after applying firm pressure to the area. This is a medical emergency.  This information is not intended to replace advice given to you by your health care provider. Make sure you discuss any questions you have with your health care provider.   Increase activity slowly   Complete by: As directed        Follow-up Information     Tommie Raymond, NP Follow up on 07/31/2022.   Specialty: Cardiology Why: @ 1pm. Please arrive at 12:45pm Contact information: [address] [place] [address] [phone]                 Duration of Discharge Encounter: Greater than 30 minutes including physician time.  Signed, Kathyrn Drown, NP  06/30/2022 8:50 AM  Patient seen, examined. Available data reviewed. Agree with findings, assessment, and plan as outlined by Kathyrn Drown, NP.  The patient is  independently interviewed and examined this morning.  He is alert, oriented, no distress.  Heart regular rate and rhythm with a 2/6 systolic murmur at the apex and right upper sternal border, lungs are clear, abdomen is soft and nontender, right groin site is clear with no hematoma or ecchymosis, no lower extremity edema.  The patient has done very well with watchman implantation.  I agree with the discharge medications and follow-up plan as outlined above.  He is medically stable for hospital discharge this morning.  [name], M.D. 06/30/2022 12:48 PM

## 2022-06-29 NOTE — Interval H&P Note (Signed)
History and Physical Interval Note:  06/29/2022 7:37 AM  Ryan Pittman  has presented today for surgery, with the diagnosis of PAF.  The various methods of treatment have been discussed with the patient and family. After consideration of risks, benefits and other options for treatment, the patient has consented to  Procedure(s): LEFT ATRIAL APPENDAGE OCCLUSION (N/A) TRANSESOPHAGEAL ECHOCARDIOGRAM (TEE) (N/A) as a surgical intervention.  The patient's history has been reviewed, patient examined, no change in status, stable for surgery.  I have reviewed the patient's chart and labs.  Questions were answered to the patient's satisfaction.     [name]

## 2022-06-29 NOTE — Progress Notes (Signed)
  [place] TEAM  Patient doing well s/p LAAO. He is hemodynamically stable. Groin site is stable. Plan for early ambulation after bedrest completed and hopeful discharge over the next 24 hours.   Kathyrn Drown NP-C Structural Heart Team  Pager: [phone] Phone: [phone]

## 2022-06-29 NOTE — Progress Notes (Signed)
Mobility Specialist Progress Note    06/29/22 1520  Mobility  Activity Ambulated independently in hallway  Level of Assistance Standby assist, set-up cues, supervision of patient - no hands on  Assistive Device None  Distance Ambulated (ft) 420 ft  Activity Response Tolerated well  Mobility Referral Yes  $Mobility charge 1 Mobility   Pre-Mobility: 71 HR, 136/54 (79) BP, 98% SpO2 During Mobility: 93 HR, 96% SpO2 Post-Mobility: 79 HR, 145/61 (85) BP, 97% SpO2  Pt received in bed and agreeable. No complaints on walk. Returned to bed with call bell in reach.    Hildred Alamin Mobility Specialist  Secure Chat Only

## 2022-06-29 NOTE — Plan of Care (Signed)
  Problem: Education: Goal: Knowledge of cardiac device and self-care will improve Outcome: Progressing Goal: Ability to safely manage health related needs after discharge will improve Outcome: Progressing Goal: Individualized Educational Video(s) Outcome: Progressing   Problem: Cardiac: Goal: Ability to achieve and maintain adequate cardiopulmonary perfusion will improve Outcome: Progressing   Problem: Education: Goal: Understanding of CV disease, CV risk reduction, and recovery process will improve Outcome: Progressing Goal: Individualized Educational Video(s) Outcome: Progressing   Problem: Activity: Goal: Ability to return to baseline activity level will improve Outcome: Progressing   Problem: Cardiovascular: Goal: Ability to achieve and maintain adequate cardiovascular perfusion will improve Outcome: Progressing Goal: Vascular access site(s) Level 0-1 will be maintained Outcome: Progressing   Problem: Health Behavior/Discharge Planning: Goal: Ability to safely manage health-related needs after discharge will improve Outcome: Progressing   Problem: Education: Goal: Knowledge of General Education information will improve Description: Including pain rating scale, medication(s)/side effects and non-pharmacologic comfort measures Outcome: Progressing   Problem: Health Behavior/Discharge Planning: Goal: Ability to manage health-related needs will improve Outcome: Progressing   Problem: Clinical Measurements: Goal: Ability to maintain clinical measurements within normal limits will improve Outcome: Progressing Goal: Will remain free from infection Outcome: Progressing Goal: Diagnostic test results will improve Outcome: Progressing Goal: Respiratory complications will improve Outcome: Progressing Goal: Cardiovascular complication will be avoided Outcome: Progressing   Problem: Activity: Goal: Risk for activity intolerance will decrease Outcome: Progressing    Problem: Nutrition: Goal: Adequate nutrition will be maintained Outcome: Progressing   Problem: Coping: Goal: Level of anxiety will decrease Outcome: Progressing   Problem: Elimination: Goal: Will not experience complications related to bowel motility Outcome: Progressing Goal: Will not experience complications related to urinary retention Outcome: Progressing   Problem: Pain Managment: Goal: General experience of comfort will improve Outcome: Progressing   Problem: Safety: Goal: Ability to remain free from injury will improve Outcome: Progressing   Problem: Skin Integrity: Goal: Risk for impaired skin integrity will decrease Outcome: Progressing

## 2022-06-29 NOTE — Anesthesia Postprocedure Evaluation (Signed)
Anesthesia Post Note  Patient: Ryan Pittman  Procedure(s) Performed: LEFT ATRIAL APPENDAGE OCCLUSION TRANSESOPHAGEAL ECHOCARDIOGRAM (TEE)     Patient location during evaluation: Cath Lab Anesthesia Type: General Level of consciousness: awake Pain management: pain level controlled Vital Signs Assessment: post-procedure vital signs reviewed and stable Respiratory status: spontaneous breathing Cardiovascular status: stable Postop Assessment: no apparent nausea or vomiting Anesthetic complications: no   There were no known notable events for this encounter.  Last Vitals:  Vitals:   06/29/22 1015 06/29/22 1020  BP: (!) 236/71 (!) 198/50  Pulse: 71 65  Resp: 18 13  Temp:    SpO2: 94% 96%    Last Pain:  Vitals:   06/29/22 0935  TempSrc: Temporal  PainSc: 0-No pain                 Leaann Nevils

## 2022-06-29 NOTE — Transfer of Care (Signed)
Immediate Anesthesia Transfer of Care Note  Patient: Ryan Pittman  Procedure(s) Performed: LEFT ATRIAL APPENDAGE OCCLUSION TRANSESOPHAGEAL ECHOCARDIOGRAM (TEE)  Patient Location: PACU and Cath Lab  Anesthesia Type:General  Level of Consciousness: awake, alert  and oriented  Airway & Oxygen Therapy: Patient Spontanous Breathing  Post-op Assessment: Report given to RN  Post vital signs: Reviewed and stable  Last Vitals:  Vitals Value Taken Time  BP 206/83 06/29/22 0936  Temp    Pulse 68 06/29/22 0940  Resp 15 06/29/22 0940  SpO2 95 % 06/29/22 0940  Vitals shown include unvalidated device data.  Last Pain:  Vitals:   06/29/22 0647  TempSrc:   PainSc: 0-No pain         Complications: There were no known notable events for this encounter.

## 2022-06-30 DIAGNOSIS — I480 Paroxysmal atrial fibrillation: Secondary | ICD-10-CM

## 2022-06-30 LAB — BASIC METABOLIC PANEL
Anion gap: 7 (ref 5–15)
BUN: 20 mg/dL (ref 8–23)
CO2: 22 mmol/L (ref 22–32)
Calcium: 8.7 mg/dL — ABNORMAL LOW (ref 8.9–10.3)
Chloride: 109 mmol/L (ref 98–111)
Creatinine, Ser: 1.53 mg/dL — ABNORMAL HIGH (ref 0.61–1.24)
GFR, Estimated: 45 mL/min — ABNORMAL LOW (ref >60)
Glucose, Bld: 125 mg/dL — ABNORMAL HIGH (ref 70–99)
Potassium: 4.4 mmol/L (ref 3.5–5.1)
Sodium: 138 mmol/L (ref 135–145)

## 2022-06-30 MED ORDER — LOSARTAN POTASSIUM 50 MG PO TABS
50.0000 mg | ORAL_TABLET | Freq: Every day | ORAL | Status: DC
  Administered 2022-06-30: 50 mg via ORAL
  Filled 2022-06-30: qty 1

## 2022-06-30 MED ORDER — LOSARTAN POTASSIUM 50 MG PO TABS: 50.0000 mg | ORAL_TABLET | Freq: Every day | ORAL | 2 refills | Status: DC

## 2022-06-30 NOTE — Progress Notes (Signed)
Mobility Specialist Progress Note    06/30/22 0944  Mobility  Activity Ambulated independently in hallway  Level of Assistance Independent  Assistive Device None  Distance Ambulated (ft) 420 ft  Activity Response Tolerated well  $Mobility charge 1 Mobility   Pre-Mobility: 75 HR, 150/57 (85) BP, 98% SpO2 During Mobility: 105 HR, 98% SpO2 Post-Mobility: 99 HR, 98% SpO2  Pt received in bed and agreeable. No complaints on walk. Returned to sitting EOB with call bell in reach.    Hildred Alamin Mobility Specialist  Secure Chat Only

## 2022-06-30 NOTE — Care Management Important Message (Signed)
Important Message  Patient Details  Name: Ryan Pittman MRN: [id number] Date of Birth: Jun 25, 1941   Medicare Important Message Given:  Yes     Shelda Altes 06/30/2022, 9:02 AM

## 2022-07-03 ENCOUNTER — Ambulatory Visit (INDEPENDENT_AMBULATORY_CARE_PROVIDER_SITE_OTHER)
Admission: RE | Admit: 2022-07-03 | Discharge: 2022-07-03 | Disposition: A | Discharge status: Home / Self Care | Payer: Medicare Other | Source: Ambulatory Visit | Attending: Surgery | Admitting: Surgery

## 2022-07-03 ENCOUNTER — Telehealth: Payer: Self-pay

## 2022-07-03 ENCOUNTER — Ambulatory Visit (INDEPENDENT_AMBULATORY_CARE_PROVIDER_SITE_OTHER): Payer: Medicare Other | Admitting: Physician Assistant

## 2022-07-03 VITALS — BP 199/73 | HR 58 | Temp 97.4°F | Resp 20 | Ht 67.0 in | Wt 157.5 lb

## 2022-07-03 DIAGNOSIS — I70213 Atherosclerosis of native arteries of extremities with intermittent claudication, bilateral legs: Secondary | ICD-10-CM | POA: Diagnosis not present

## 2022-07-03 DIAGNOSIS — I701 Atherosclerosis of renal artery: Secondary | ICD-10-CM | POA: Diagnosis not present

## 2022-07-03 DIAGNOSIS — I739 Peripheral vascular disease, unspecified: Secondary | ICD-10-CM

## 2022-07-03 NOTE — Telephone Encounter (Signed)
  [place] Team  Contacted the patient regarding discharge from [facility] on 06/30/2022  The patient understands to follow up with Kathyrn Drown on 07/31/2022 in preparation for imaging on 08/11/2022.  The patient understands discharge instructions? Yes  The patient understands medications and regimen? Yes   The patient reports groin site looks healthy with no signs or symptoms of bleeding or infection.  The patient understands to call with any questions or concerns prior to scheduled visit.

## 2022-07-03 NOTE — Progress Notes (Signed)
[facility] [place] HISTORY AND PHYSICAL   History of Present Illness:  Patient is a 81 y.o. year old male who presents for evaluation of PAD.  Pt has hx of right iliofemoral endarterectomy with bovine pericardial patch angioplasty on 02/13/20 by Dr. Trula Slade for severe right leg claudication.   in October 2021, he underwent drug coated balloon angioplasty of the right EIA and CFA and atherectomy with drug coated balloon angioplasty of the right SFA by Dr. Trula Slade.  Pt also has hx of left femoral to below knee popliteal bypass with vein by Dr. Lucky Cowboy in [place].  He has known RAS.  He did have selective renal angiography, which did not identify any significant stenosis.     Pt was seen in February and he was continuing to have RLE claudication.   He underwent stenting of the right SFA on 10/25/2021 by Dr. Trula Slade.     Pt was last seen 11/28/2021 and at that time, pt had been having issues with dropping his hgb and he had been taken off of Eliquis and underwent full workup but did not discover etiology.  He was also taken off of antiplatelet medication.   06/29/22 The patient was admitted and underwent left atrial appendage occlusive device placement with Watchman FLX 64m device.  Medication plan will be to restart ASA '81mg'$  and Plavix '75mg'$  and continue DAPT through 6 months of therapy (12/28/21). A repeat TEE has been scheduled for 08/11/22 with Dr. CSallyanne Kusterto ensure device seal with no thrombus.   Pt has hx of CABG in 2019.    The pt returns today for follow up.  He states that overall he is doing well.  He does get some cramping in the right calf with walking without change from previous visit.  .  Distance depends on the surface that he is walking on.  He does not have any issues with the left leg.    He continues to deny any rest pain or non healing wounds.  He does have claudication symptoms that seemed to get more frequent with anemia.   Pt owns [employer].   The pt is on  a statin for cholesterol management.   (Repatha) The pt is on an aspirin and Plavix.    Other AC:  none The pt is on BB, ARB for hypertension.  The pt does not have diabetes. Tobacco hx:  former      Past Medical History:  Diagnosis Date   Anemia    due to GIB, s/p transfusion   [place]inal [place] ([place] 07/10/[place]   Arthritis    back pain, much worse after consecutive golf rounds   Cancer ([facility]    thyroid   Chronic kidney disease    Colon polyps    Coronary artery disease    COVID-19    Dyspnea    walking up a hill   Dysrhythmia 2020   GERD (gastroesophageal reflux disease)    Hypercalcemia    h/o, resolved as of 2012, prev due to high amount of calcium intake   Hyperlipidemia    Hypertension    Hypothyroidism    IgG gammopathy    stable as of 2012 per [facility]   Pneumonia    as a child   Presence of Watchman left atrial appendage closure device 06/29/2022   Watchman FLX 281mwith Dr. CoBurt Knack PVD (peripheral vascular disease) ([place]   L leg bypass, R leg stented    Past Surgical History:  Procedure Laterality Date    dental implant left bottom-did have bleeding      ABDOMINAL AORTOGRAM W/LOWER EXTREMITY Bilateral 06/26/2019   Procedure: ABDOMINAL AORTOGRAM W/LOWER EXTREMITY;  Surgeon: [name], MD;  Location: [facility];  Service: Cardiovascular;  Laterality: Bilateral;   ABDOMINAL AORTOGRAM W/LOWER EXTREMITY N/A 06/08/2020   Procedure: ABDOMINAL AORTOGRAM W/LOWER EXTREMITY;  Surgeon: Serafina Mitchell, MD;  Location: [place] [facility];  Service: Cardiovascular;  Laterality: N/A;   BYPASS GRAFT     L leg   CARDIAC CATHETERIZATION  06/24/2018   COLONOSCOPY N/A 10/09/2021   Procedure: COLONOSCOPY;  Surgeon: Toledo, Benay Pike, MD;  Location: [facility] ENDOSCOPY;  Service: Gastroenterology;  Laterality: N/A;   COLONOSCOPY WITH PROPOFOL N/A 05/04/2015   Procedure: COLONOSCOPY WITH PROPOFOL;  Surgeon: Lollie Sails, MD;  Location: [facility] ENDOSCOPY;  Service:  Endoscopy;  Laterality: N/A;   CORONARY ARTERY BYPASS GRAFT N/A 07/11/2018   Procedure: CORONARY ARTERY BYPASS GRAFTING (CABG) x three, using left internal mammary artery and right leg greater saphenous vein harvested endoscopically;  Surgeon: Gaye Pollack, MD;  Location: [facility];  Service: Open Heart Surgery;  Laterality: N/A;   ENDARTERECTOMY FEMORAL Right 02/13/2020   Procedure: RIGHT ENDARTERECTOMY FEMORAL WITH BOVINE PATCH;  Surgeon: Serafina Mitchell, MD;  Location: [facility];  Service: Vascular;  Laterality: Right;   ESOPHAGOGASTRODUODENOSCOPY N/A 10/09/2021   Procedure: ESOPHAGOGASTRODUODENOSCOPY (EGD);  Surgeon: Toledo, Benay Pike, MD;  Location: [facility] ENDOSCOPY;  Service: Gastroenterology;  Laterality: N/A;   GIVENS CAPSULE STUDY N/A 10/09/2021   Procedure: GIVENS CAPSULE STUDY;  Surgeon: Toledo, Benay Pike, MD;  Location: [facility] ENDOSCOPY;  Service: Gastroenterology;  Laterality: N/A;   LEFT ATRIAL APPENDAGE OCCLUSION N/A 06/29/2022   Procedure: LEFT ATRIAL APPENDAGE OCCLUSION;  Surgeon: [name], MD;  Location: [place] [facility];  Service: Cardiovascular;  Laterality: N/A;   LEFT HEART CATH AND CORONARY ANGIOGRAPHY N/A 06/24/2018   Procedure: LEFT HEART CATH AND CORONARY ANGIOGRAPHY;  Surgeon: [name], MD;  Location: [place] [facility];  Service: Cardiovascular;  Laterality: N/A;   LOWER EXTREMITY ANGIOGRAPHY Bilateral 10/25/2021   Procedure: Lower Extremity Angiography;  Surgeon: Serafina Mitchell, MD;  Location: [facility];  Service: Cardiovascular;  Laterality: Bilateral;   PERIPHERAL VASCULAR ATHERECTOMY Right 06/08/2020   Procedure: PERIPHERAL VASCULAR ATHERECTOMY;  Surgeon: Serafina Mitchell, MD;  Location: [place] [facility];  Service: Cardiovascular;  Laterality: Right;  superficial femoral   PERIPHERAL VASCULAR BALLOON ANGIOPLASTY Right 06/08/2020   Procedure: PERIPHERAL VASCULAR BALLOON ANGIOPLASTY;  Surgeon: Serafina Mitchell, MD;  Location: [place] [facility];  Service:  Cardiovascular;  Laterality: Right;  External iliac   PERIPHERAL VASCULAR INTERVENTION Right 10/25/2021   Procedure: PERIPHERAL VASCULAR INTERVENTION;  Surgeon: Serafina Mitchell, MD;  Location: [facility];  Service: Cardiovascular;  Laterality: Right;  SFA   RENAL ANGIOGRAPHY Bilateral 10/25/2021   Procedure: RENAL ANGIOGRAPHY;  Surgeon: Serafina Mitchell, MD;  Location: [facility] [facility];  Service: Cardiovascular;  Laterality: Bilateral;   TEE WITHOUT CARDIOVERSION N/A 07/11/2018   Procedure: TRANSESOPHAGEAL ECHOCARDIOGRAM (TEE);  Surgeon: Gaye Pollack, MD;  Location: [facility];  Service: Open Heart Surgery;  Laterality: N/A;   TEE WITHOUT CARDIOVERSION N/A 06/29/2022   Procedure: TRANSESOPHAGEAL ECHOCARDIOGRAM (TEE);  Surgeon: [name], MD;  Location: [facility] [facility];  Service: Cardiovascular;  Laterality: N/A;   THYROIDECTOMY, PARTIAL  2016   VASCULAR SURGERY      ROS:   General:  No weight loss, Fever, chills  HEENT: No recent headaches, no nasal bleeding, no visual changes, no sore throat  Neurologic: No dizziness, blackouts, seizures. No recent symptoms of stroke or mini- stroke. No recent episodes of slurred speech, or temporary blindness.  Cardiac: No recent episodes of chest pain/pressure, no shortness of breath at rest.  No shortness of breath with exertion.  Positive  history of atrial fibrillation or irregular heartbeat  Vascular: No history of rest pain in feet.  positive history of claudication.  No history of non-healing ulcer, No history of DVT   Pulmonary: No home oxygen, no productive cough, no hemoptysis,  No asthma or wheezing  Musculoskeletal:  '[ ]'$  Arthritis, '[ ]'$  Low back pain,  '[ ]'$  Joint pain  Hematologic:No history of hypercoagulable state.  No history of easy bleeding.  No history of anemia  Gastrointestinal: No hematochezia or melena,  No gastroesophageal reflux, no trouble swallowing  Urinary: '[ ]'$  chronic Kidney disease, '[ ]'$  on HD - '[ ]'$  MWF or  '[ ]'$  TTHS, '[ ]'$  Burning with urination, '[ ]'$  Frequent urination, '[ ]'$  Difficulty urinating;   Skin: No rashes  Psychological: No history of anxiety,  No history of depression  Social History Social History   Tobacco Use   Smoking status: Former    Types: Cigarettes    Quit date: 09/04/2008    Years since quitting: 13.8    Passive exposure: Never   Smokeless tobacco: Former    Types: Chew   Tobacco comments:    HAs quit tobacco products 2009  Vaping Use   Vaping Use: Never used  Substance Use Topics   Alcohol use: Yes    Comment: occ   Drug use: No    Family History Family History  Problem Relation Age of Onset   Diabetes Mother    Stroke Father    Colon cancer Neg Hx    Prostate cancer Neg Hx     Allergies  Allergies  Allergen Reactions   Penicillins Itching and Other (See Comments)    PATIENT HAS HAD A PCN REACTION WITH IMMEDIATE RASH, FACIAL/TONGUE/THROAT SWELLING, SOB, OR LIGHTHEADEDNESS WITH HYPOTENSION:  #  #  YES  #  #  Has patient had a PCN reaction causing severe rash involving mucus membranes or skin necrosis: No Has patient had a PCN reaction that required hospitalization: No Has patient had a PCN reaction occurring within the last 10 years: No If all of the above answers are "NO", then may proceed with Cephalosporin use.    Ace Inhibitors Cough   Aspirin Other (See Comments)    H/o GI bleed   Celebrex [Celecoxib] Other (See Comments)    GI bleed   Lipitor [Atorvastatin] Other (See Comments)    myalgias     Current Outpatient Medications  Medication Sig Dispense Refill   acetaminophen (TYLENOL) 500 MG tablet Take 1,000 mg by mouth every 6 (six) hours as needed for moderate pain or headache.     aspirin EC 81 MG tablet Take 1 tablet (81 mg total) by mouth daily. Swallow whole. 90 tablet 3   Cholecalciferol (DIALYVITE VITAMIN D 5000 PO) Take 5,000 Units by mouth daily.     clopidogrel (PLAVIX) 75 MG tablet Take 1 tablet (75 mg total) by mouth daily. 90  tablet 3   ferrous sulfate 325 (65 FE) MG EC tablet Take 1 tablet (325 mg total) by mouth daily. 90 tablet 1   hydrOXYzine (ATARAX) 10 MG tablet ONE-HALF TO ONE  TABLET 3 TIMES DAILY AS NEEDED FOR ANXIETY 30 tablet 2   levothyroxine (SYNTHROID) 75 MCG tablet TAKE 1 TABLET BY MOUTH DAILY EXCEPT ON THURSDAYS TAKE 1.5 TABLETS 100 tablet 3   losartan (COZAAR) 50 MG tablet Take 1 tablet (50 mg total) by mouth daily. 60 tablet 2   metoprolol tartrate (LOPRESSOR) 25 MG tablet Take 1 tablet (25 mg total) by mouth 2 (two) times daily. 180 tablet 3   Multiple Vitamins-Minerals (MULTIVITAMIN MEN 50+) TABS Take 1 tablet by mouth daily.     pantoprazole (PROTONIX) 40 MG tablet Take 1 tablet (40 mg total) by mouth daily. 90 tablet 3   pramipexole (MIRAPEX) 1 MG tablet Take 1 mg by mouth daily as needed (restless leg).     REPATHA SURECLICK 389 MG/ML SOAJ INJECT '140MG'$  EVERY 14 DAYS (Patient taking differently: Inject 140 mg into the skin every 14 (fourteen) days.) 2 mL 11   tamsulosin (FLOMAX) 0.4 MG CAPS capsule Take 1 capsule (0.4 mg total) by mouth daily. 90 capsule 3   No current facility-administered medications for this visit.    Physical Examination  Vitals:   07/03/22 0909  BP: (!) 199/73  Pulse: (!) 58  Resp: 20  Temp: (!) 97.4 F (36.3 C)  TempSrc: Temporal  SpO2: 99%  Weight: 157 lb 8 oz (71.4 kg)  Height: '5\' 7"'$  (1.702 m)    Body mass index is 24.67 kg/m.  General:  Alert and oriented, no acute distress HEENT: Normal Neck: No bruit or JVD Pulmonary: Clear to auscultation bilaterally Cardiac: Regular Rate and Rhythm without murmur Abdomen: Soft, non-tender, non-distended, no mass, no scars Skin: No rash Extremity Pulses:  feet warm and well perfuse without ischemic changes Musculoskeletal: No deformity or edema  Neurologic: Upper and lower extremity motor 5/5 and symmetric  DATA:      ABI Findings:  +---------+------------------+-----+----------+--------+  Right    Rt  Pressure (mmHg)IndexWaveform  Comment   +---------+------------------+-----+----------+--------+  Brachial 178                                        +---------+------------------+-----+----------+--------+  PTA      254               1.32 biphasic            +---------+------------------+-----+----------+--------+  DP       254               1.32 monophasic          +---------+------------------+-----+----------+--------+  Great Toe80                0.42 Abnormal            +---------+------------------+-----+----------+--------+   +---------+------------------+-----+--------+-------+  Left     Lt Pressure (mmHg)IndexWaveformComment  +---------+------------------+-----+--------+-------+  Brachial 192                                     +---------+------------------+-----+--------+-------+  PTA      254               1.32 biphasic         +---------+------------------+-----+--------+-------+  DP       254               1.32 biphasic         +---------+------------------+-----+--------+-------+  Great Toe104               0.54 Abnormal         +---------+------------------+-----+--------+-------+   +-------+-----------+-----------+------------+------------+  ABI/TBIToday's ABIToday's TBIPrevious ABIPrevious TBI  +-------+-----------+-----------+------------+------------+  Right  [place]         0.42       [place]          0.31          +-------+-----------+-----------+------------+------------+  Left   [place]         0.54                 0.41          +-------+-----------+-----------+------------+------------+      Summary:  Right: Resting right ankle-brachial index indicates noncompressible right  lower extremity arteries. The right toe-brachial index is abnormal.   Left: Resting left ankle-brachial index indicates noncompressible left  lower extremity arteries. The left toe-brachial index is abnormal.      +-----------+--------+-----+---------------+----------+--------------------  ----+  RIGHT      PSV cm/sRatioStenosis       Waveform  Comments                    +-----------+--------+-----+---------------+----------+--------------------  ----+  EIA Distal 177                         biphasic                              +-----------+--------+-----+---------------+----------+--------------------  ----+  CFA Prox   114                         biphasic  dampened                    +-----------+--------+-----+---------------+----------+--------------------  ----+  CFA Distal 132                         biphasic  dampened                    +-----------+--------+-----+---------------+----------+--------------------  ----+  DFA        182                         monophasic                            +-----------+--------+-----+---------------+----------+--------------------  ----+  SFA Distal 43                          biphasic  dampened, broad,                                                             calcific plaque            +-----------+--------+-----+---------------+----------+--------------------  ----+  POP Prox   76                          biphasic  dampened, broad            +-----------+--------+-----+---------------+----------+--------------------  ----+  POP Mid    89 450       75-99% stenosisbiphasic  dampened broad  biphasic   +-----------+--------+-----+---------------+----------+--------------------  ----+  POP Distal 97                                                                +-----------+--------+-----+---------------+----------+--------------------  ----+  ATA Distal 8                           monophasicdampened                    +-----------+--------+-----+---------------+----------+--------------------  ----+  PTA Distal 81                           monophasic                            +-----------+--------+-----+---------------+----------+--------------------  ----+  PERO Distal                                      NV                          +-----------+--------+-----+---------------+----------+--------------------  ----+      Right Stent(s): SFA  +---------------+--++--------+---------------+  Prox to Stent  68biphasicdampened         +---------------+--++--------+---------------+  Proximal Stent 72biphasicdampened, broad  +---------------+--++--------+---------------+  Mid Stent      46biphasicdampened         +---------------+--++--------+---------------+  Distal Stent   59biphasicdampened         +---------------+--++--------+---------------+  Distal to Stent56biphasicdampened, broad  +---------------+--++--------+---------------+            Left Graft #1: Femoral to popliteal bypass graft  +------------------+--------+--------+--------+--------+                    PSV cm/sStenosisWaveformComments  +------------------+--------+--------+--------+--------+  Inflow            172             biphasic          +------------------+--------+--------+--------+--------+  Prox Anastomosis  193             biphasic          +------------------+--------+--------+--------+--------+  Proximal Graft    48              biphasic          +------------------+--------+--------+--------+--------+  Mid Graft         39              biphasicbroad     +------------------+--------+--------+--------+--------+  Distal Graft      54              biphasicbroad     +------------------+--------+--------+--------+--------+  Distal Anastomosis39              biphasicbroad     +------------------+--------+--------+--------+--------+  Outflow           54              biphasicbroad     +------------------+--------+--------+--------+--------+           +-----------+--------+-----+--------+----------+--------+  LEFT       PSV cm/sRatioStenosisWaveform  Comments  +-----------+--------+-----+--------+----------+--------+  ATA Distal 56                   biphasic            +-----------+--------+-----+--------+----------+--------+  PTA Distal 41                   monophasicbrisk     +-----------+--------+-----+--------+----------+--------+  PERO Distal28                   biphasic            +-----------+--------+-----+--------+----------+--------+      Summary:  Right: 75-99% stenosis noted in the popliteal artery. Patent stent with no  visualized stenosis.   Left: Patent left femoral to popliteal bypass graft with no visualized  stenosis.        ASSESSMENT/PLAN:  PAD s/p multiple B LE interventions.  He continues to have claudication in there right LE.  He denies non healing wounds or rest pain.     S/P  right iliofemoral endarterectomy with bovine pericardial patch angioplasty on 02/13/20 by Dr. Trula Slade for severe right leg claudication.   in October 2021, he underwent drug coated balloon angioplasty of the right EIA and CFA and atherectomy with drug coated balloon angioplasty of the right SFA by Dr. Trula Slade.  Pt also has hx of left femoral to below knee popliteal bypass with vein by Dr. Lucky Cowboy in [place].   The stents are patent on the right, as well as the left LE bypass.  The right mid popliteal artery has increased stenosis 75-99% with PSV of 450 cm/s from previous study.  His ABI's are stable with falsely elevated index and calcification.   Grossly his claudication symptoms on the right LE are stable and at baseline.  He denies rest pain or non healing wounds.  He will cont. DAPT and stay as active as he tolerates.  If he develops symptoms of wounds or rest pain he will call otherwise he will f/u in 6 months with repeat studies.     Roxy Horseman PA-C [facility]  [place] Office: [phone]  MD in clinic [place]

## 2022-07-04 ENCOUNTER — Other Ambulatory Visit: Payer: Self-pay | Admitting: Oncology

## 2022-07-05 ENCOUNTER — Telehealth: Payer: Self-pay

## 2022-07-05 ENCOUNTER — Encounter: Payer: Self-pay | Admitting: Oncology

## 2022-07-05 ENCOUNTER — Other Ambulatory Visit: Payer: Self-pay

## 2022-07-05 DIAGNOSIS — I739 Peripheral vascular disease, unspecified: Secondary | ICD-10-CM

## 2022-07-05 DIAGNOSIS — I70213 Atherosclerosis of native arteries of extremities with intermittent claudication, bilateral legs: Secondary | ICD-10-CM

## 2022-07-05 NOTE — Patient Outreach (Signed)
  Care Coordination TOC Note Transition Care Management Follow-up Telephone Call Date of discharge and from where: [facility] 06/[facility]-06/30/22 How have you been since you were released from the hospital? "I am doing good, have no complaints" Any questions or concerns? No  Items Reviewed: Did the pt receive and understand the discharge instructions provided? Yes  Medications obtained and verified? Yes  Other? No  Any new allergies since your discharge? No  Dietary orders reviewed? Yes Do you have support at home? No   Home Care and Equipment/Supplies: Were home health services ordered? no If so, what is the name of the agency? N/A  Has the agency set up a time to come to the patient's home? not applicable Were any new equipment or medical supplies ordered?  No What is the name of the medical supply agency? N/A Were you able to get the supplies/equipment? not applicable Do you have any questions related to the use of the equipment or supplies? No  Functional Questionnaire: (I = Independent and D = Dependent) ADLs: I  Bathing/Dressing- I  Meal Prep- I  Eating- I  Maintaining continence- I  Transferring/Ambulation- I  Managing Meds- I  Follow up appointments reviewed:  PCP Hospital f/u appt confirmed? No   Specialist Hospital f/u appt confirmed? Yes  Scheduled to see Kathyrn Drown NP on 07/31/22 @ 12:45. Are transportation arrangements needed? No  If their condition worsens, is the pt aware to call PCP or go to the Emergency Dept.? Yes Was the patient provided with contact information for the PCP's office or ED? Yes Was to pt encouraged to call back with questions or concerns? Yes  SDOH assessments and interventions completed:   Yes  Care Coordination Interventions Activated:  Yes   Care Coordination Interventions:  PCP follow up appointment requested   Encounter Outcome:  Pt. Visit Completed

## 2022-07-11 DIAGNOSIS — D649 Anemia, unspecified: Secondary | ICD-10-CM | POA: Diagnosis not present

## 2022-07-11 DIAGNOSIS — D472 Monoclonal gammopathy: Secondary | ICD-10-CM | POA: Diagnosis not present

## 2022-07-25 DIAGNOSIS — Z23 Encounter for immunization: Secondary | ICD-10-CM | POA: Diagnosis not present

## 2022-07-26 ENCOUNTER — Other Ambulatory Visit: Payer: Self-pay | Admitting: Cardiovascular Disease

## 2022-07-31 ENCOUNTER — Encounter: Payer: Self-pay | Admitting: Cardiology

## 2022-07-31 ENCOUNTER — Ambulatory Visit: Payer: Medicare Other | Attending: Cardiovascular Disease | Admitting: Cardiology

## 2022-07-31 VITALS — BP 140/80 | HR 68 | Ht 67.0 in | Wt 155.4 lb

## 2022-07-31 DIAGNOSIS — Z01810 Encounter for preprocedural cardiovascular examination: Secondary | ICD-10-CM | POA: Diagnosis not present

## 2022-07-31 DIAGNOSIS — Z951 Presence of aortocoronary bypass graft: Secondary | ICD-10-CM

## 2022-07-31 DIAGNOSIS — I10 Essential (primary) hypertension: Secondary | ICD-10-CM | POA: Diagnosis not present

## 2022-07-31 DIAGNOSIS — Z8719 Personal history of other diseases of the digestive system: Secondary | ICD-10-CM | POA: Diagnosis not present

## 2022-07-31 DIAGNOSIS — I480 Paroxysmal atrial fibrillation: Secondary | ICD-10-CM | POA: Diagnosis not present

## 2022-07-31 DIAGNOSIS — I739 Peripheral vascular disease, unspecified: Secondary | ICD-10-CM

## 2022-07-31 DIAGNOSIS — Z95818 Presence of other cardiac implants and grafts: Secondary | ICD-10-CM

## 2022-07-31 DIAGNOSIS — I2510 Atherosclerotic heart disease of native coronary artery without angina pectoris: Secondary | ICD-10-CM | POA: Diagnosis not present

## 2022-07-31 DIAGNOSIS — I701 Atherosclerosis of renal artery: Secondary | ICD-10-CM

## 2022-07-31 MED ORDER — AZITHROMYCIN 500 MG PO TABS: ORAL_TABLET | ORAL | 3 refills | Status: DC

## 2022-07-31 NOTE — Patient Instructions (Signed)
Medication Instructions:  TAKE Azithromycin '500mg'$  one hour prior to dental cleanings/procedures *If you need a refill on your cardiac medications before your next appointment, please call your pharmacy*  Lab Work: CBC,BMET 08/08/22 If you have labs (blood work) drawn today and your tests are completely normal, you will receive your results only by: [place] (if you have MyChart) OR A paper copy in the mail If you have any lab test that is abnormal or we need to change your treatment, we will call you to review the results.  Testing/Procedures: Transesophageal Echocardiogram Your physician has requested that you have a TEE. During a TEE, sound waves are used to create images of your heart. It provides your doctor with information about the size and shape of your heart and how well your heart's chambers and valves are working. In this test, a transducer is attached to the end of a flexible tube that's guided down your throat and into your esophagus (the tube leading from you mouth to your stomach) to get a more detailed image of your heart. You are not awake for the procedure. Please see the instruction sheet given to you today. For further information please visit [email or web address].  Follow-Up: At [facility], you and your health needs are our priority.  As part of our continuing mission to provide you with exceptional heart care, we have created designated Provider Care Teams.  These Care Teams include your primary Cardiologist (physician) and Advanced Practice Providers (APPs -  Physician Assistants and Nurse Practitioners) who all work together to provide you with the care you need, when you need it.  Your next appointment:   12/27/22 '@1'$ :30pm  The format for your next appointment:   In Person  Provider:   Structural APP    Other Instructions   Important Information About [place]

## 2022-07-31 NOTE — Progress Notes (Unsigned)
[facility]                                     Cardiology Office Note:    Date:  08/01/2022   ID:  [name], DOB 1941-05-24, MRN [id number]  PCP:  Tonia Ghent, MD  [facility] Cardiologist:  [name], MD/ Dr. Burt Knack, MD [facility]) [facility] Electrophysiologist:  None   Referring MD: Tonia Ghent, MD   Chief Complaint  Patient presents with   Follow-up    Pre TEE    History of Present Illness:    KHRISTOPHER KAPAUN is a 81 y.o. male with a hx of PAD (L fempop bypass and R SFA stenting), CAD s/p CABG, HTN, hypothyroidism, small bowel AVMs, PAF with recurrent GI bleeding requiring transfusions on Eliquis (therefore discontinued) who is now s/p LAAO with Watchman.    Mr. Engelmann was referred to Dr. Burt Knack for the evaluation of possible LAAO due to significant bleeding while on anticoagulation. He was hospitalized in 10/2021 found to have a hemoglobin down to about 7 mg/dL. He received 2 units PRBCs with Hb improvement. EGD and colonoscopy at that time was essentially unrevealing but capsule endoscopy demonstrated multiple AVMs in the small bowel without active bleeding.   On Watchman consult, he was having trouble even tolerating low dose Eliquis 2.5 mg twice daily. His stools were guaiac positive therefore it was discontinued. Given this, he was felt to be a good candidate Watchman and is now s/p LAAO with Watchman FLX 64m device performed 06/29/22.   Post procedure, he was started on ASA and Plavix with plans to continue DAPT through 6 months of therapy (12/29/22) as tolerated. A repeat TEE has been scheduled for 08/11/22 with Dr. CSallyanne Kusterto ensure device seal with no thrombus. He will require dental SBE for 6 months after implant. Given PCN allergy, will RX azithromycin at follow up. Post op BP was elevated therefore losartan was increased from '25mg'$  to '50mg'$  QD.   Today he is here with his wife. He has been doing well post Watchman and tolerating  ASA and Plavix with no concern for recurrent bleeding. He denies chest pain, palpitations, LE edema, orthopnea, dizziness, or syncope.   Past Medical History:  Diagnosis Date   Anemia    due to GIB, s/p transfusion   Anginal pain ([place] 07/10/2018   Arthritis    back pain, much worse after consecutive golf rounds   Cancer ([facility]    thyroid   Chronic kidney disease    Colon polyps    Coronary artery disease    COVID-19    Dyspnea    walking up a hill   Dysrhythmia 2020   GERD (gastroesophageal reflux disease)    Hypercalcemia    h/o, resolved as of 2012, prev due to high amount of calcium intake   Hyperlipidemia    Hypertension    Hypothyroidism    IgG gammopathy    stable as of 2012 per [facility]   Pneumonia    as a child   Presence of Watchman left atrial appendage closure device 06/29/2022   Watchman FLX 212mwith Dr. CoBurt Knack PVD (peripheral vascular disease) [place]   L leg bypass, R leg stented    Past Surgical History:  Procedure Laterality Date    dental implant left bottom-did have bleeding      ABDOMINAL AORTOGRAM W/LOWER  EXTREMITY Bilateral 06/26/2019   Procedure: ABDOMINAL AORTOGRAM W/LOWER EXTREMITY;  Surgeon: [name], MD;  Location: [place] [facility];  Service: Cardiovascular;  Laterality: Bilateral;   ABDOMINAL AORTOGRAM W/LOWER EXTREMITY N/A 06/08/2020   Procedure: ABDOMINAL AORTOGRAM W/LOWER EXTREMITY;  Surgeon: Serafina Mitchell, MD;  Location: [place] [facility];  Service: Cardiovascular;  Laterality: N/A;   BYPASS GRAFT     L leg   CARDIAC CATHETERIZATION  06/24/2018   COLONOSCOPY N/A 10/09/2021   Procedure: COLONOSCOPY;  Surgeon: Toledo, Benay Pike, MD;  Location: [facility] ENDOSCOPY;  Service: Gastroenterology;  Laterality: N/A;   COLONOSCOPY WITH PROPOFOL N/A 05/04/2015   Procedure: COLONOSCOPY WITH PROPOFOL;  Surgeon: Lollie Sails, MD;  Location: [facility] ENDOSCOPY;  Service: Endoscopy;  Laterality: N/A;   CORONARY ARTERY BYPASS GRAFT N/A 07/11/2018    Procedure: CORONARY ARTERY BYPASS GRAFTING (CABG) x three, using left internal mammary artery and right leg greater saphenous vein harvested endoscopically;  Surgeon: Gaye Pollack, MD;  Location: [place] OR;  Service: Open Heart Surgery;  Laterality: N/A;   ENDARTERECTOMY FEMORAL Right 02/13/2020   Procedure: RIGHT ENDARTERECTOMY FEMORAL WITH BOVINE PATCH;  Surgeon: Serafina Mitchell, MD;  Location: [facility];  Service: Vascular;  Laterality: Right;   ESOPHAGOGASTRODUODENOSCOPY N/A 10/09/2021   Procedure: ESOPHAGOGASTRODUODENOSCOPY (EGD);  Surgeon: Toledo, Benay Pike, MD;  Location: [facility] ENDOSCOPY;  Service: Gastroenterology;  Laterality: N/A;   GIVENS CAPSULE STUDY N/A 10/09/2021   Procedure: GIVENS CAPSULE STUDY;  Surgeon: Toledo, Benay Pike, MD;  Location: [facility] ENDOSCOPY;  Service: Gastroenterology;  Laterality: N/A;   LEFT ATRIAL APPENDAGE OCCLUSION N/A 06/29/2022   Procedure: LEFT ATRIAL APPENDAGE OCCLUSION;  Surgeon: [name], MD;  Location: [place] [facility];  Service: Cardiovascular;  Laterality: N/A;   LEFT HEART CATH AND CORONARY ANGIOGRAPHY N/A 06/24/2018   Procedure: LEFT HEART CATH AND CORONARY ANGIOGRAPHY;  Surgeon: [name], MD;  Location: [place] [facility];  Service: Cardiovascular;  Laterality: N/A;   LOWER EXTREMITY ANGIOGRAPHY Bilateral 10/25/2021   Procedure: Lower Extremity Angiography;  Surgeon: Serafina Mitchell, MD;  Location: [place] [facility];  Service: Cardiovascular;  Laterality: Bilateral;   PERIPHERAL VASCULAR ATHERECTOMY Right 06/08/2020   Procedure: PERIPHERAL VASCULAR ATHERECTOMY;  Surgeon: Serafina Mitchell, MD;  Location: [place] [facility];  Service: Cardiovascular;  Laterality: Right;  superficial femoral   PERIPHERAL VASCULAR BALLOON ANGIOPLASTY Right 06/08/2020   Procedure: PERIPHERAL VASCULAR BALLOON ANGIOPLASTY;  Surgeon: Serafina Mitchell, MD;  Location: [place] [facility];  Service: Cardiovascular;  Laterality: Right;  External iliac   PERIPHERAL VASCULAR  INTERVENTION Right 10/25/2021   Procedure: PERIPHERAL VASCULAR INTERVENTION;  Surgeon: Serafina Mitchell, MD;  Location: [place] [facility];  Service: Cardiovascular;  Laterality: Right;  SFA   RENAL ANGIOGRAPHY Bilateral 10/25/2021   Procedure: RENAL ANGIOGRAPHY;  Surgeon: Serafina Mitchell, MD;  Location: [place] [facility];  Service: Cardiovascular;  Laterality: Bilateral;   TEE WITHOUT CARDIOVERSION N/A 07/11/2018   Procedure: TRANSESOPHAGEAL ECHOCARDIOGRAM (TEE);  Surgeon: Gaye Pollack, MD;  Location: [place];  Service: Open Heart Surgery;  Laterality: N/A;   TEE WITHOUT CARDIOVERSION N/A 06/29/2022   Procedure: TRANSESOPHAGEAL ECHOCARDIOGRAM (TEE);  Surgeon: [name], MD;  Location: [place] [facility];  Service: Cardiovascular;  Laterality: N/A;   THYROIDECTOMY, PARTIAL  2016   VASCULAR SURGERY      Current Medications: Current Meds  Medication Sig   acetaminophen (TYLENOL) 500 MG tablet Take 1,000 mg by mouth every 6 (six) hours as  needed for moderate pain or headache.   aspirin EC 81 MG tablet Take 1 tablet (81 mg total) by mouth daily. Swallow whole.   azithromycin (ZITHROMAX) 500 MG tablet Take 2 tablets by mouth one hour prior to dental cleanings/procedures   Cholecalciferol (DIALYVITE VITAMIN D 5000 PO) Take 5,000 Units by mouth daily.   clopidogrel (PLAVIX) 75 MG tablet Take 1 tablet (75 mg total) by mouth daily.   ferrous sulfate 325 (65 FE) MG EC tablet Take 1 tablet (325 mg total) by mouth daily.   hydrOXYzine (ATARAX) 10 MG tablet ONE-HALF TO ONE TABLET 3 TIMES DAILY AS NEEDED FOR ANXIETY   levothyroxine (SYNTHROID) 75 MCG tablet TAKE 1 TABLET BY MOUTH DAILY EXCEPT ON THURSDAYS TAKE 1.5 TABLETS   losartan (COZAAR) 50 MG tablet Take 1 tablet (50 mg total) by mouth daily.   metoprolol tartrate (LOPRESSOR) 25 MG tablet Take 1 tablet (25 mg total) by mouth 2 (two) times daily.   Multiple Vitamins-Minerals (MULTIVITAMIN MEN 50+) TABS Take 1 tablet by mouth daily.    pantoprazole (PROTONIX) 40 MG tablet Take 1 tablet (40 mg total) by mouth daily.   pramipexole (MIRAPEX) 1 MG tablet Take 1 mg by mouth daily as needed (restless leg).   REPATHA SURECLICK 458 MG/ML SOAJ INJECT '140MG'$  EVERY 14 DAYS   tamsulosin (FLOMAX) 0.4 MG CAPS capsule Take 1 capsule (0.4 mg total) by mouth daily.     Allergies:   Penicillins, Ace inhibitors, Aspirin, Celebrex [celecoxib], and Lipitor [atorvastatin]   Social History   Socioeconomic History   Marital status: Married    Spouse name: Not on file   Number of children: Not on file   Years of education: Not on file   Highest education level: Not on file  Occupational History   Not on file  Tobacco Use   Smoking status: Former    Types: Cigarettes    Quit date: 09/04/2008    Years since quitting: 13.9    Passive exposure: Never   Smokeless tobacco: Former    Types: Chew   Tobacco comments:    HAs quit tobacco products 2009  Vaping Use   Vaping Use: Never used  Substance and Sexual Activity   Alcohol use: Yes    Comment: occ   Drug use: No   Sexual activity: Yes  Other Topics Concern   Not on file  Social History Narrative   Married 50+ years, 2 kids   [place] to play golf   Social Determinants of Health   Financial Resource Strain: Low Risk  (12/22/2021)   Overall Financial Resource Strain (CARDIA)    Difficulty of Paying Living Expenses: Not hard at all  Food Insecurity: No Food Insecurity (05/17/2022)   Hunger Vital Sign    Worried About Running Out of Food in the Last Year: Never true    [place] in the Last Year: Never true  Transportation Needs: No Transportation Needs (07/05/2022)   PRAPARE - Hydrologist (Medical): No    Lack of Transportation (Non-Medical): No  Physical Activity: Insufficiently Active (12/22/2021)   Exercise Vital Sign    Days of Exercise per Week: 3 days    Minutes of Exercise per Session: 30 min  Stress: No Stress Concern  Present (12/22/2021)   [place]    Feeling of Stress : Not at all  Social Connections: Moderately Integrated (12/22/2021)   Social  Connection and Isolation Panel [NHANES]    Frequency of Communication with Friends and Family: More than three times a week    Frequency of Social Gatherings with Friends and Family: More than three times a week    Attends Religious Services: More than 4 times per year    Active Member of [employer] or Organizations: No    Attends Archivist Meetings: Never    Marital Status: Married     Family History: The patient's family history includes Diabetes in his mother; Stroke in his father. There is no history of Colon cancer or Prostate cancer.  ROS:   Please see the history of present illness.    All other systems reviewed and are negative.  EKGs/Labs/Other Studies Reviewed:    The following studies were reviewed today:  Procedures This Admission:  Transeptal Puncture Intra-procedural TEE which showed no LAA thrombus Left atrial appendage occlusive device placement on 06/29/22 by Dr. Burt Knack.    This study demonstrated:    Successful left atrial appendage occlusion with a 27 mm Watchman FLX device   Recommend: continue ASA/clopidogrel x 6 months as tolerated, TEE in 6 weeks  EKG:  EKG is ordered today.  The ekg ordered today demonstrates NSR with HR 68bpm.   Recent Labs: 08/25/2021: TSH 2.246 09/01/2021: Magnesium 2.1 12/07/2021: ALT 14 06/29/2022: Hemoglobin 11.1; Platelets 190 06/30/2022: BUN 20; Creatinine, Ser 1.53; Potassium 4.4; Sodium 138   Recent Lipid Panel    Component Value Date/Time   CHOL 145 08/05/2021 0925   CHOL 119 04/27/2020 0000   CHOL 227 11/15/2012 0000   TRIG 72 08/05/2021 0925   TRIG 209 (A) 04/27/2020 0000   HDL 78 08/05/2021 0925   CHOLHDL 1.9 08/05/2021 0925   CHOLHDL 2 12/30/2020 1128   VLDL 50.0 (H) 12/30/2020 1128   LDLCALC 53 08/05/2021  0925   LDLCALC 23 04/27/2020 0000   LDLDIRECT 41.0 12/30/2020 1128   Physical Exam:    VS:  BP (!) 140/80   Pulse 68   Ht '5\' 7"'$  (1.702 m)   Wt 155 lb 6.4 oz (70.5 kg)   SpO2 99%   BMI 24.34 kg/m     Wt Readings from Last 3 Encounters:  07/31/22 155 lb 6.4 oz (70.5 kg)  07/03/22 157 lb 8 oz (71.4 kg)  06/29/22 155 lb (70.3 kg)    General: Well developed, well nourished, NAD Lungs:Clear to ausculation bilaterally. No wheezes, rales, or rhonchi. Breathing is unlabored. Cardiovascular: RRR with S1 S2. No murmurs Extremities: No edema.  Neuro: Alert and oriented. No focal deficits. No facial asymmetry. MAE spontaneously. Psych: Responds to questions appropriately with normal affect.    ASSESSMENT/PLAN:    Paroxsymal atrial fibrillation: s/p successful left atrial appendage occlusion with a 27 mm Watchman FLX device 06/29/22 with recommendations to continue ASA '81mg'$  QD and Plavix '75mg'$  QD as tolerated. Will follow closely given significant hx of GI bleeding. He is scheduled for TEE 08/11/22 with Dr. Sallyanne Kuster to ensure device seal with no thrombus. Obtain CBC, BMET today. Dental SBE will be required until 12/28/21. Will RX with Azithromycin.   PAD: s/p Left fem pop bypass and Right SFA with stenting. Will plan on continuing Plavix and ASA as tolerated post LAAO. Denies claudication.   CAD s/p CABG: Stable with no anginal symptoms. No changes today.   HTN: Stable with no changes needed today. Tolerating increased dose of losartan at '50mg'$  QD.   Anemia: Follows with hematology/oncology. Continue ferrous sulfate.  Medication Adjustments/Labs and Tests Ordered: Current medicines are reviewed at length with the patient today.  Concerns regarding medicines are outlined above.  Orders Placed This Encounter  Procedures   CBC   Basic metabolic panel   EKG 24-MGNO   Meds ordered this encounter  Medications   azithromycin (ZITHROMAX) 500 MG tablet    Sig: Take 2 tablets by mouth one  hour prior to dental cleanings/procedures    Dispense:  2 tablet    Refill:  3    Patient Instructions  Medication Instructions:  TAKE Azithromycin '500mg'$  one hour prior to dental cleanings/procedures *If you need a refill on your cardiac medications before your next appointment, please call your pharmacy*  Lab Work: CBC,BMET 08/08/22 If you have labs (blood work) drawn today and your tests are completely normal, you will receive your results only by: [place] (if you have MyChart) OR A paper copy in the mail If you have any lab test that is abnormal or we need to change your treatment, we will call you to review the results.  Testing/Procedures: Transesophageal Echocardiogram Your physician has requested that you have a TEE. During a TEE, sound waves are used to create images of your heart. It provides your doctor with information about the size and shape of your heart and how well your heart's chambers and valves are working. In this test, a transducer is attached to the end of a flexible tube that's guided down your throat and into your esophagus (the tube leading from you mouth to your stomach) to get a more detailed image of your heart. You are not awake for the procedure. Please see the instruction sheet given to you today. For further information please visit [email or web address].  Follow-Up: At [facility], you and your health needs are our priority.  As part of our continuing mission to provide you with exceptional heart care, we have created designated Provider Care Teams.  These Care Teams include your primary Cardiologist (physician) and Advanced Practice Providers (APPs -  Physician Assistants and Nurse Practitioners) who all work together to provide you with the care you need, when you need it.  Your next appointment:   12/27/22 '@1'$ :30pm  The format for your next appointment:   In Person  Provider:   Structural APP    Other Instructions   Important  Information About Sugar         Signed, Kathyrn Drown, NP  08/01/2022 8:47 AM    [place]

## 2022-07-31 NOTE — H&P (View-Only) (Signed)
[facility]                                     Cardiology Office Note:    Date:  08/01/2022   ID:  [name], DOB 1941-06-15, MRN [id number]  PCP:  Tonia Ghent, MD  [facility] Cardiologist:  [name], MD/ Dr. Burt Knack, MD [facility]) [facility] Electrophysiologist:  None   Referring MD: Tonia Ghent, MD   Chief Complaint  Patient presents with   Follow-up    Pre TEE    History of Present Illness:    Ryan Pittman is a 81 y.o. male with a hx of PAD (L fempop bypass and R SFA stenting), CAD s/p CABG, HTN, hypothyroidism, small bowel AVMs, PAF with recurrent GI bleeding requiring transfusions on Eliquis (therefore discontinued) who is now s/p LAAO with Watchman.    Mr. Sondgeroth was referred to Dr. Burt Knack for the evaluation of possible LAAO due to significant bleeding while on anticoagulation. He was hospitalized in 10/2021 found to have a hemoglobin down to about 7 mg/dL. He received 2 units PRBCs with Hb improvement. EGD and colonoscopy at that time was essentially unrevealing but capsule endoscopy demonstrated multiple AVMs in the small bowel without active bleeding.   On Watchman consult, he was having trouble even tolerating low dose Eliquis 2.5 mg twice daily. His stools were guaiac positive therefore it was discontinued. Given this, he was felt to be a good candidate Watchman and is now s/p LAAO with Watchman FLX 4m device performed 06/29/22.   Post procedure, he was started on ASA and Plavix with plans to continue DAPT through 6 months of therapy (12/29/22) as tolerated. A repeat TEE has been scheduled for 08/11/22 with Dr. CSallyanne Kusterto ensure device seal with no thrombus. He will require dental SBE for 6 months after implant. Given PCN allergy, will RX azithromycin at follow up. Post op BP was elevated therefore losartan was increased from '25mg'$  to '50mg'$  QD.   Today he is here with his wife. He has been doing well post Watchman and tolerating  ASA and Plavix with no concern for recurrent bleeding. He denies chest pain, palpitations, LE edema, orthopnea, dizziness, or syncope.   Past Medical History:  Diagnosis Date   Anemia    due to GIB, s/p transfusion   Anginal pain ([place] 07/10/2018   Arthritis    back pain, much worse after consecutive golf rounds   Cancer ([facility]    thyroid   Chronic kidney disease    Colon polyps    Coronary artery disease    COVID-19    Dyspnea    walking up a hill   Dysrhythmia 2020   GERD (gastroesophageal reflux disease)    Hypercalcemia    h/o, resolved as of 2012, prev due to high amount of calcium intake   Hyperlipidemia    Hypertension    Hypothyroidism    IgG gammopathy    stable as of 2012 per [facility]   Pneumonia    as a child   Presence of Watchman left atrial appendage closure device 06/29/2022   Watchman FLX 237mwith Dr. CoBurt Knack PVD (peripheral vascular disease) ([place]   L leg bypass, R leg stented    Past Surgical History:  Procedure Laterality Date    dental implant left bottom-did have bleeding      ABDOMINAL AORTOGRAM W/LOWER  EXTREMITY Bilateral 06/26/2019   Procedure: ABDOMINAL AORTOGRAM W/LOWER EXTREMITY;  Surgeon: [name], MD;  Location: [place] CV [facility];  Service: Cardiovascular;  Laterality: Bilateral;   ABDOMINAL AORTOGRAM W/LOWER EXTREMITY N/A 06/08/2020   Procedure: ABDOMINAL AORTOGRAM W/LOWER EXTREMITY;  Surgeon: Serafina Mitchell, MD;  Location: [place] [facility];  Service: Cardiovascular;  Laterality: N/A;   BYPASS GRAFT     L leg   CARDIAC CATHETERIZATION  06/24/2018   COLONOSCOPY N/A 10/09/2021   Procedure: COLONOSCOPY;  Surgeon: Toledo, Benay Pike, MD;  Location: [facility] ENDOSCOPY;  Service: Gastroenterology;  Laterality: N/A;   COLONOSCOPY WITH PROPOFOL N/A 05/04/2015   Procedure: COLONOSCOPY WITH PROPOFOL;  Surgeon: Lollie Sails, MD;  Location: [facility] ENDOSCOPY;  Service: Endoscopy;  Laterality: N/A;   CORONARY ARTERY BYPASS GRAFT N/A 07/11/2018    Procedure: CORONARY ARTERY BYPASS GRAFTING (CABG) x three, using left internal mammary artery and right leg greater saphenous vein harvested endoscopically;  Surgeon: Gaye Pollack, MD;  Location: [place] OR;  Service: Open Heart Surgery;  Laterality: N/A;   ENDARTERECTOMY FEMORAL Right 02/13/2020   Procedure: RIGHT ENDARTERECTOMY FEMORAL WITH BOVINE PATCH;  Surgeon: Serafina Mitchell, MD;  Location: [facility] OR;  Service: Vascular;  Laterality: Right;   ESOPHAGOGASTRODUODENOSCOPY N/A 10/09/2021   Procedure: ESOPHAGOGASTRODUODENOSCOPY (EGD);  Surgeon: Toledo, Benay Pike, MD;  Location: [facility] ENDOSCOPY;  Service: Gastroenterology;  Laterality: N/A;   GIVENS CAPSULE STUDY N/A 10/09/2021   Procedure: GIVENS CAPSULE STUDY;  Surgeon: Toledo, Benay Pike, MD;  Location: [facility] ENDOSCOPY;  Service: Gastroenterology;  Laterality: N/A;   LEFT ATRIAL APPENDAGE OCCLUSION N/A 06/29/2022   Procedure: LEFT ATRIAL APPENDAGE OCCLUSION;  Surgeon: [name], MD;  Location: [place] [facility];  Service: Cardiovascular;  Laterality: N/A;   LEFT HEART CATH AND CORONARY ANGIOGRAPHY N/A 06/24/2018   Procedure: LEFT HEART CATH AND CORONARY ANGIOGRAPHY;  Surgeon: [name], MD;  Location: [place] [facility];  Service: Cardiovascular;  Laterality: N/A;   LOWER EXTREMITY ANGIOGRAPHY Bilateral 10/25/2021   Procedure: Lower Extremity Angiography;  Surgeon: Serafina Mitchell, MD;  Location: [place] [facility];  Service: Cardiovascular;  Laterality: Bilateral;   PERIPHERAL VASCULAR ATHERECTOMY Right 06/08/2020   Procedure: PERIPHERAL VASCULAR ATHERECTOMY;  Surgeon: Serafina Mitchell, MD;  Location: [place] [facility];  Service: Cardiovascular;  Laterality: Right;  superficial femoral   PERIPHERAL VASCULAR BALLOON ANGIOPLASTY Right 06/08/2020   Procedure: PERIPHERAL VASCULAR BALLOON ANGIOPLASTY;  Surgeon: Serafina Mitchell, MD;  Location: [place] [facility];  Service: Cardiovascular;  Laterality: Right;  External iliac   PERIPHERAL VASCULAR  INTERVENTION Right 10/25/2021   Procedure: PERIPHERAL VASCULAR INTERVENTION;  Surgeon: Serafina Mitchell, MD;  Location: [place] [facility];  Service: Cardiovascular;  Laterality: Right;  SFA   RENAL ANGIOGRAPHY Bilateral 10/25/2021   Procedure: RENAL ANGIOGRAPHY;  Surgeon: Serafina Mitchell, MD;  Location: [place] [facility];  Service: Cardiovascular;  Laterality: Bilateral;   TEE WITHOUT CARDIOVERSION N/A 07/11/2018   Procedure: TRANSESOPHAGEAL ECHOCARDIOGRAM (TEE);  Surgeon: Gaye Pollack, MD;  Location: [place];  Service: Open Heart Surgery;  Laterality: N/A;   TEE WITHOUT CARDIOVERSION N/A 06/29/2022   Procedure: TRANSESOPHAGEAL ECHOCARDIOGRAM (TEE);  Surgeon: [name], MD;  Location: [place] [facility];  Service: Cardiovascular;  Laterality: N/A;   THYROIDECTOMY, PARTIAL  2016   VASCULAR SURGERY      Current Medications: Current Meds  Medication Sig   acetaminophen (TYLENOL) 500 MG tablet Take 1,000 mg by mouth every 6 (six) hours as  needed for moderate pain or headache.   aspirin EC 81 MG tablet Take 1 tablet (81 mg total) by mouth daily. Swallow whole.   azithromycin (ZITHROMAX) 500 MG tablet Take 2 tablets by mouth one hour prior to dental cleanings/procedures   Cholecalciferol (DIALYVITE VITAMIN D 5000 PO) Take 5,000 Units by mouth daily.   clopidogrel (PLAVIX) 75 MG tablet Take 1 tablet (75 mg total) by mouth daily.   ferrous sulfate 325 (65 FE) MG EC tablet Take 1 tablet (325 mg total) by mouth daily.   hydrOXYzine (ATARAX) 10 MG tablet ONE-HALF TO ONE TABLET 3 TIMES DAILY AS NEEDED FOR ANXIETY   levothyroxine (SYNTHROID) 75 MCG tablet TAKE 1 TABLET BY MOUTH DAILY EXCEPT ON THURSDAYS TAKE 1.5 TABLETS   losartan (COZAAR) 50 MG tablet Take 1 tablet (50 mg total) by mouth daily.   metoprolol tartrate (LOPRESSOR) 25 MG tablet Take 1 tablet (25 mg total) by mouth 2 (two) times daily.   Multiple Vitamins-Minerals (MULTIVITAMIN MEN 50+) TABS Take 1 tablet by mouth daily.    pantoprazole (PROTONIX) 40 MG tablet Take 1 tablet (40 mg total) by mouth daily.   pramipexole (MIRAPEX) 1 MG tablet Take 1 mg by mouth daily as needed (restless leg).   REPATHA SURECLICK 026 MG/ML SOAJ INJECT '140MG'$  EVERY 14 DAYS   tamsulosin (FLOMAX) 0.4 MG CAPS capsule Take 1 capsule (0.4 mg total) by mouth daily.     Allergies:   Penicillins, Ace inhibitors, Aspirin, Celebrex [celecoxib], and Lipitor [atorvastatin]   Social History   Socioeconomic History   Marital status: Married    Spouse name: Not on file   Number of children: Not on file   Years of education: Not on file   Highest education level: Not on file  Occupational History   Not on file  Tobacco Use   Smoking status: Former    Types: Cigarettes    Quit date: 09/04/2008    Years since quitting: 13.9    Passive exposure: Never   Smokeless tobacco: Former    Types: Chew   Tobacco comments:    HAs quit tobacco products 2009  Vaping Use   Vaping Use: Never used  Substance and Sexual Activity   Alcohol use: Yes    Comment: occ   Drug use: No   Sexual activity: Yes  Other Topics Concern   Not on file  Social History Narrative   Married 50+ years, 2 kids   Marcus to play golf   Social Determinants of Health   Financial Resource Strain: Low Risk  (12/22/2021)   Overall Financial Resource Strain (CARDIA)    Difficulty of Paying Living Expenses: Not hard at all  Food Insecurity: No Food Insecurity (05/17/2022)   Hunger Vital Sign    Worried About Running Out of Food in the Last Year: Never true    [place] in the Last Year: Never true  Transportation Needs: No Transportation Needs (07/05/2022)   PRAPARE - Hydrologist (Medical): No    Lack of Transportation (Non-Medical): No  Physical Activity: Insufficiently Active (12/22/2021)   Exercise Vital Sign    Days of Exercise per Week: 3 days    Minutes of Exercise per Session: 30 min  Stress: No Stress Concern  Present (12/22/2021)   [place]    Feeling of Stress : Not at all  Social Connections: Moderately Integrated (12/22/2021)   Social  Connection and Isolation Panel [NHANES]    Frequency of Communication with Friends and Family: More than three times a week    Frequency of Social Gatherings with Friends and Family: More than three times a week    Attends Religious Services: More than 4 times per year    Active Member of [employer] or Organizations: No    Attends Archivist Meetings: Never    Marital Status: Married     Family History: The patient's family history includes Diabetes in his mother; Stroke in his father. There is no history of Colon cancer or Prostate cancer.  ROS:   Please see the history of present illness.    All other systems reviewed and are negative.  EKGs/Labs/Other Studies Reviewed:    The following studies were reviewed today:  Procedures This Admission:  Transeptal Puncture Intra-procedural TEE which showed no LAA thrombus Left atrial appendage occlusive device placement on 06/29/22 by Dr. Burt Knack.    This study demonstrated:    Successful left atrial appendage occlusion with a 27 mm Watchman FLX device   Recommend: continue ASA/clopidogrel x 6 months as tolerated, TEE in 6 weeks  EKG:  EKG is ordered today.  The ekg ordered today demonstrates NSR with HR 68bpm.   Recent Labs: 08/25/2021: TSH 2.246 09/01/2021: Magnesium 2.1 12/07/2021: ALT 14 06/29/2022: Hemoglobin 11.1; Platelets 190 06/30/2022: BUN 20; Creatinine, Ser 1.53; Potassium 4.4; Sodium 138   Recent Lipid Panel    Component Value Date/Time   CHOL 145 08/05/2021 0925   CHOL 119 04/27/2020 0000   CHOL 227 11/15/2012 0000   TRIG 72 08/05/2021 0925   TRIG 209 (A) 04/27/2020 0000   HDL 78 08/05/2021 0925   CHOLHDL 1.9 08/05/2021 0925   CHOLHDL 2 12/30/2020 1128   VLDL 50.0 (H) 12/30/2020 1128   LDLCALC 53 08/05/2021  0925   LDLCALC 23 04/27/2020 0000   LDLDIRECT 41.0 12/30/2020 1128   Physical Exam:    VS:  BP (!) 140/80   Pulse 68   Ht '5\' 7"'$  (1.702 m)   Wt 155 lb 6.4 oz (70.5 kg)   SpO2 99%   BMI 24.34 kg/m     Wt Readings from Last 3 Encounters:  07/31/22 155 lb 6.4 oz (70.5 kg)  07/03/22 157 lb 8 oz (71.4 kg)  06/29/22 155 lb (70.3 kg)    General: Well developed, well nourished, NAD Lungs:Clear to ausculation bilaterally. No wheezes, rales, or rhonchi. Breathing is unlabored. Cardiovascular: RRR with S1 S2. No murmurs Extremities: No edema.  Neuro: Alert and oriented. No focal deficits. No facial asymmetry. MAE spontaneously. Psych: Responds to questions appropriately with normal affect.    ASSESSMENT/PLAN:    Paroxsymal atrial fibrillation: s/p successful left atrial appendage occlusion with a 27 mm Watchman FLX device 06/29/22 with recommendations to continue ASA '81mg'$  QD and Plavix '75mg'$  QD as tolerated. Will follow closely given significant hx of GI bleeding. He is scheduled for TEE 08/11/22 with Dr. Sallyanne Kuster to ensure device seal with no thrombus. Obtain CBC, BMET today. Dental SBE will be required until 12/28/21. Will RX with Azithromycin.   PAD: s/p Left fem pop bypass and Right SFA with stenting. Will plan on continuing Plavix and ASA as tolerated post LAAO. Denies claudication.   CAD s/p CABG: Stable with no anginal symptoms. No changes today.   HTN: Stable with no changes needed today. Tolerating increased dose of losartan at '50mg'$  QD.   Anemia: Follows with hematology/oncology. Continue ferrous sulfate.  Medication Adjustments/Labs and Tests Ordered: Current medicines are reviewed at length with the patient today.  Concerns regarding medicines are outlined above.  Orders Placed This Encounter  Procedures   CBC   Basic metabolic panel   EKG [id number]   Meds ordered this encounter  Medications   azithromycin (ZITHROMAX) 500 MG tablet    Sig: Take 2 tablets by mouth one  hour prior to dental cleanings/procedures    Dispense:  2 tablet    Refill:  3    Patient Instructions  Medication Instructions:  TAKE Azithromycin '500mg'$  one hour prior to dental cleanings/procedures *If you need a refill on your cardiac medications before your next appointment, please call your pharmacy*  Lab Work: CBC,BMET 08/08/22 If you have labs (blood work) drawn today and your tests are completely normal, you will receive your results only by: [place] (if you have MyChart) OR A paper copy in the mail If you have any lab test that is abnormal or we need to change your treatment, we will call you to review the results.  Testing/Procedures: Transesophageal Echocardiogram Your physician has requested that you have a TEE. During a TEE, sound waves are used to create images of your heart. It provides your doctor with information about the size and shape of your heart and how well your heart's chambers and valves are working. In this test, a transducer is attached to the end of a flexible tube that's guided down your throat and into your esophagus (the tube leading from you mouth to your stomach) to get a more detailed image of your heart. You are not awake for the procedure. Please see the instruction sheet given to you today. For further information please visit [email or web address].  Follow-Up: At [facility], you and your health needs are our priority.  As part of our continuing mission to provide you with exceptional heart care, we have created designated Provider Care Teams.  These Care Teams include your primary Cardiologist (physician) and Advanced Practice Providers (APPs -  Physician Assistants and Nurse Practitioners) who all work together to provide you with the care you need, when you need it.  Your next appointment:   12/27/22 '@1'$ :30pm  The format for your next appointment:   In Person  Provider:   Structural APP    Other Instructions   Important  Information About Sugar         Signed, Kathyrn Drown, NP  08/01/2022 8:47 AM    [place]

## 2022-08-07 ENCOUNTER — Telehealth: Payer: Self-pay | Admitting: Cardiovascular Disease

## 2022-08-07 NOTE — Telephone Encounter (Signed)
[name], MD  You; Theodoro Parma, RN    Should be fine to wait until his upcoming evaluation later this week thx     Spoke with pt's wife with Dr. Antionette Char recommendations. She verbalizes understanding.

## 2022-08-07 NOTE — Telephone Encounter (Signed)
Should be fine to wait until his upcoming evaluation later this week thx

## 2022-08-07 NOTE — Telephone Encounter (Signed)
Calling to say that the patient is experiencing pain right under his left arm. wanted to see if he needs to be check out prior to his procedure on Friday. Please advise

## 2022-08-07 NOTE — Telephone Encounter (Signed)
Spoke with pt's wife regarding left arm pain that pt has been experiencing for the last 2-3 weeks. Pt's wife states that pain was coming from under the left arm at the airpit, is described as sore but was only noticed when pt moved his arm. Wife states that he noticed the pain more today when they were in the car riding and the road was bumpy, pt felt the pain travel to his left chest muscle from the left arm. Wife states that when left arm pain was mentioned at his office visit on 11/27 he was told that this was unrelated to his watchman. Wife wants to make sure pt doesn't need evaluating prior to TEE that is scheduled for 12/8. Wife would like message to be sent to Dr. Burt Knack and structural heart team to advise on this matter.

## 2022-08-08 ENCOUNTER — Other Ambulatory Visit
Admission: RE | Admit: 2022-08-08 | Discharge: 2022-08-08 | Disposition: A | Discharge status: Home / Self Care | Payer: Medicare Other | Source: Ambulatory Visit | Attending: Cardiology | Admitting: Cardiology

## 2022-08-08 ENCOUNTER — Ambulatory Visit: Payer: Medicare Other | Admitting: Physician Assistant

## 2022-08-08 DIAGNOSIS — C799 Secondary malignant neoplasm of unspecified site: Secondary | ICD-10-CM | POA: Diagnosis not present

## 2022-08-08 DIAGNOSIS — Z01810 Encounter for preprocedural cardiovascular examination: Secondary | ICD-10-CM | POA: Diagnosis not present

## 2022-08-08 DIAGNOSIS — Z8719 Personal history of other diseases of the digestive system: Secondary | ICD-10-CM | POA: Diagnosis not present

## 2022-08-08 DIAGNOSIS — I739 Peripheral vascular disease, unspecified: Secondary | ICD-10-CM | POA: Insufficient documentation

## 2022-08-08 DIAGNOSIS — Z95818 Presence of other cardiac implants and grafts: Secondary | ICD-10-CM | POA: Insufficient documentation

## 2022-08-08 DIAGNOSIS — I10 Essential (primary) hypertension: Secondary | ICD-10-CM | POA: Diagnosis not present

## 2022-08-08 DIAGNOSIS — Z951 Presence of aortocoronary bypass graft: Secondary | ICD-10-CM | POA: Insufficient documentation

## 2022-08-08 DIAGNOSIS — I480 Paroxysmal atrial fibrillation: Secondary | ICD-10-CM | POA: Diagnosis not present

## 2022-08-08 LAB — BASIC METABOLIC PANEL
Anion gap: 8 (ref 5–15)
BUN: 31 mg/dL — ABNORMAL HIGH (ref 8–23)
CO2: 23 mmol/L (ref 22–32)
Calcium: 9.5 mg/dL (ref 8.9–10.3)
Chloride: 111 mmol/L (ref 98–111)
Creatinine, Ser: 1.21 mg/dL (ref 0.61–1.24)
GFR, Estimated: 59 mL/min — ABNORMAL LOW (ref >60)
Glucose, Bld: 117 mg/dL — ABNORMAL HIGH (ref 70–99)
Potassium: 4.1 mmol/L (ref 3.5–5.1)
Sodium: 142 mmol/L (ref 135–145)

## 2022-08-08 LAB — CBC
HCT: 32.8 % — ABNORMAL LOW (ref 39.0–52.0)
Hemoglobin: 10.8 g/dL — ABNORMAL LOW (ref 13.0–17.0)
MCH: 31 pg (ref 26.0–34.0)
MCHC: 32.9 g/dL (ref 30.0–36.0)
MCV: 94.3 fL (ref 80.0–100.0)
Platelets: 236 10*3/uL (ref 150–400)
RBC: 3.48 MIL/uL — ABNORMAL LOW (ref 4.22–5.81)
RDW: 15.8 % — ABNORMAL HIGH (ref 11.5–15.5)
WBC: 11.3 10*3/uL — ABNORMAL HIGH (ref 4.0–10.5)
nRBC: 0 % (ref 0.0–0.2)

## 2022-08-11 ENCOUNTER — Encounter (HOSPITAL_COMMUNITY)
Admission: RE | Disposition: A | Discharge status: Home / Self Care | Payer: Self-pay | Source: Home / Self Care | Attending: Cardiovascular Disease

## 2022-08-11 ENCOUNTER — Ambulatory Visit (HOSPITAL_COMMUNITY): Payer: Medicare Other | Admitting: Anesthesiology

## 2022-08-11 ENCOUNTER — Other Ambulatory Visit: Payer: Self-pay

## 2022-08-11 ENCOUNTER — Encounter (HOSPITAL_COMMUNITY): Payer: Self-pay | Admitting: Cardiovascular Disease

## 2022-08-11 ENCOUNTER — Ambulatory Visit (HOSPITAL_BASED_OUTPATIENT_CLINIC_OR_DEPARTMENT_OTHER)
Admission: RE | Admit: 2022-08-11 | Discharge: 2022-08-11 | Disposition: A | Discharge status: Home / Self Care | Payer: Medicare Other | Source: Ambulatory Visit | Attending: Cardiovascular Disease | Admitting: Cardiovascular Disease

## 2022-08-11 ENCOUNTER — Ambulatory Visit (HOSPITAL_COMMUNITY)
Admission: RE | Admit: 2022-08-11 | Discharge: 2022-08-11 | Disposition: A | Discharge status: Home / Self Care | Payer: Medicare Other | Attending: Cardiovascular Disease | Admitting: Cardiovascular Disease

## 2022-08-11 ENCOUNTER — Ambulatory Visit (HOSPITAL_BASED_OUTPATIENT_CLINIC_OR_DEPARTMENT_OTHER): Payer: Medicare Other | Admitting: Anesthesiology

## 2022-08-11 DIAGNOSIS — Q2111 Secundum atrial septal defect: Secondary | ICD-10-CM | POA: Insufficient documentation

## 2022-08-11 DIAGNOSIS — I25119 Atherosclerotic heart disease of native coronary artery with unspecified angina pectoris: Secondary | ICD-10-CM

## 2022-08-11 DIAGNOSIS — I480 Paroxysmal atrial fibrillation: Secondary | ICD-10-CM

## 2022-08-11 DIAGNOSIS — I700 Atherosclerosis of aorta: Secondary | ICD-10-CM | POA: Diagnosis not present

## 2022-08-11 DIAGNOSIS — I10 Essential (primary) hypertension: Secondary | ICD-10-CM | POA: Diagnosis not present

## 2022-08-11 DIAGNOSIS — Z95818 Presence of other cardiac implants and grafts: Secondary | ICD-10-CM

## 2022-08-11 DIAGNOSIS — I340 Nonrheumatic mitral (valve) insufficiency: Secondary | ICD-10-CM

## 2022-08-11 DIAGNOSIS — Z87891 Personal history of nicotine dependence: Secondary | ICD-10-CM

## 2022-08-11 DIAGNOSIS — Z7902 Long term (current) use of antithrombotics/antiplatelets: Secondary | ICD-10-CM | POA: Diagnosis not present

## 2022-08-11 DIAGNOSIS — Z8719 Personal history of other diseases of the digestive system: Secondary | ICD-10-CM

## 2022-08-11 DIAGNOSIS — Z951 Presence of aortocoronary bypass graft: Secondary | ICD-10-CM | POA: Diagnosis not present

## 2022-08-11 DIAGNOSIS — D649 Anemia, unspecified: Secondary | ICD-10-CM | POA: Diagnosis not present

## 2022-08-11 DIAGNOSIS — I739 Peripheral vascular disease, unspecified: Secondary | ICD-10-CM | POA: Diagnosis not present

## 2022-08-11 DIAGNOSIS — I499 Cardiac arrhythmia, unspecified: Secondary | ICD-10-CM | POA: Diagnosis not present

## 2022-08-11 DIAGNOSIS — I350 Nonrheumatic aortic (valve) stenosis: Secondary | ICD-10-CM | POA: Insufficient documentation

## 2022-08-11 DIAGNOSIS — Z79899 Other long term (current) drug therapy: Secondary | ICD-10-CM | POA: Diagnosis not present

## 2022-08-11 DIAGNOSIS — Z7982 Long term (current) use of aspirin: Secondary | ICD-10-CM | POA: Diagnosis not present

## 2022-08-11 DIAGNOSIS — I119 Hypertensive heart disease without heart failure: Secondary | ICD-10-CM | POA: Diagnosis not present

## 2022-08-11 DIAGNOSIS — I2510 Atherosclerotic heart disease of native coronary artery without angina pectoris: Secondary | ICD-10-CM | POA: Diagnosis not present

## 2022-08-11 HISTORY — PX: TEE WITHOUT CARDIOVERSION: SHX5443

## 2022-08-11 LAB — ECHO TEE
AR max vel: 1.62 cm2
AV Area VTI: 1.47 cm2
AV Area mean vel: 1.46 cm2
AV Mean grad: 14 mmHg
AV Peak grad: 19.4 mmHg
Ao pk vel: 2.2 m/s

## 2022-08-11 SURGERY — ECHOCARDIOGRAM, TRANSESOPHAGEAL
Anesthesia: Monitor Anesthesia Care

## 2022-08-11 MED ORDER — LIDOCAINE 2% (20 MG/ML) 5 ML SYRINGE
INTRAMUSCULAR | Status: DC | PRN
  Administered 2022-08-11: 100 mg via INTRAVENOUS

## 2022-08-11 MED ORDER — SODIUM CHLORIDE 0.9 % IV SOLN: INTRAVENOUS | Status: DC

## 2022-08-11 MED ORDER — PROPOFOL 500 MG/50ML IV EMUL
INTRAVENOUS | Status: DC | PRN
  Administered 2022-08-11: 125 ug/kg/min via INTRAVENOUS

## 2022-08-11 MED ORDER — PROPOFOL 10 MG/ML IV BOLUS
INTRAVENOUS | Status: DC | PRN
  Administered 2022-08-11: 40 mg via INTRAVENOUS
  Administered 2022-08-11 (×2): 20 mg via INTRAVENOUS

## 2022-08-11 NOTE — Anesthesia Postprocedure Evaluation (Signed)
Anesthesia Post Note  Patient: Ryan Pittman  Procedure(s) Performed: TRANSESOPHAGEAL ECHOCARDIOGRAM (TEE)     Patient location during evaluation: Endoscopy Anesthesia Type: MAC Level of consciousness: oriented, awake and alert and awake Pain management: pain level controlled Vital Signs Assessment: post-procedure vital signs reviewed and stable Respiratory status: spontaneous breathing, nonlabored ventilation, respiratory function stable and patient connected to nasal cannula oxygen Cardiovascular status: blood pressure returned to baseline and stable Postop Assessment: no headache, no backache and no apparent nausea or vomiting Anesthetic complications: no   No notable events documented.  Last Vitals:  Vitals:   08/11/22 1244 08/11/22 1249  BP: (!) 145/59 133/79  Pulse: (!) 52 (!) 54  Resp: 14 12  Temp:    SpO2: 97% 97%    Last Pain:  Vitals:   08/11/22 1249  TempSrc:   PainSc: 0-No pain                 Santa Lighter

## 2022-08-11 NOTE — Interval H&P Note (Signed)
History and Physical Interval Note:  08/11/2022 12:03 PM  Ryan Pittman  has presented today for surgery, with the diagnosis of post watchman evaluation.  The various methods of treatment have been discussed with the patient and family. After consideration of risks, benefits and other options for treatment, the patient has consented to  Procedure(s): TRANSESOPHAGEAL ECHOCARDIOGRAM (TEE) (N/A) as a surgical intervention.  The patient's history has been reviewed, patient examined, no change in status, stable for surgery.  I have reviewed the patient's chart and labs.  Questions were answered to the patient's satisfaction.     Mily Malecki

## 2022-08-11 NOTE — Transfer of Care (Signed)
Immediate Anesthesia Transfer of Care Note  Patient: Ryan Pittman  Procedure(s) Performed: TRANSESOPHAGEAL ECHOCARDIOGRAM (TEE)  Patient Location: Short Stay  Anesthesia Type:MAC  Level of Consciousness: awake and alert   Airway & Oxygen Therapy: Patient Spontanous Breathing  Post-op Assessment: Report given to RN and Post -op Vital signs reviewed and stable  Post vital signs: Reviewed and stable  Last Vitals:  Vitals Value Taken Time  BP    Temp    Pulse 60 08/11/22 1229  Resp 16 08/11/22 1229  SpO2 97 % 08/11/22 1229  Vitals shown include unvalidated device data.  Last Pain:  Vitals:   08/11/22 1058  TempSrc: Temporal  PainSc: 0-No pain         Complications: No notable events documented.

## 2022-08-11 NOTE — Op Note (Signed)
INDICATIONS: S/P Watchman   PROCEDURE:   Informed consent was obtained prior to the procedure. The risks, benefits and alternatives for the procedure were discussed and the patient comprehended these risks.  Risks include, but are not limited to, cough, sore throat, vomiting, nausea, somnolence, esophageal and stomach trauma or perforation, bleeding, low blood pressure, aspiration, pneumonia, infection, trauma to the teeth and death.    After a procedural time-out, the oropharynx was anesthetized with 20% benzocaine spray.   During this procedure the patient was administered  IV propofol by Anesthesiology, Dr. Gifford Shave.  The transesophageal probe was inserted in the esophagus and stomach without difficulty and multiple views were obtained.  The patient was kept under observation until the patient left the procedure room.  The patient left the procedure room in stable condition.   Agitated microbubble saline contrast was not administered.  COMPLICATIONS:    There were no immediate complications.  FINDINGS:  Well-seated Watchman device without leaks.  RECOMMENDATIONS:     Continue antiplatelet therapy per protocol until f/u from Dr. Burt Knack.  Time Spent Directly with the Patient:  30 minutes   Ryan Pittman 08/11/2022, 12:26 PM

## 2022-08-11 NOTE — Anesthesia Preprocedure Evaluation (Addendum)
Anesthesia Evaluation  Patient identified by MRN, date of birth, ID band Patient awake    Reviewed: Allergy & Precautions, NPO status , Patient's Chart, lab work & pertinent test results, reviewed documented beta blocker date and time   Airway Mallampati: II  TM Distance: >3 FB Neck ROM: Full    Dental  (+) Dental Advisory Given, Caps   Pulmonary former smoker   Pulmonary exam normal breath sounds clear to auscultation       Cardiovascular hypertension, Pt. on medications and Pt. on home beta blockers + angina  + CAD and + Peripheral Vascular Disease  Normal cardiovascular exam+ dysrhythmias (Presence of Watchman left atrial appendage closure device)  Rhythm:Regular Rate:Normal  Echo 06/29/22: 1. Left ventricular ejection fraction, by estimation, is 60 to 65%. The  left ventricle has normal function. The left ventricle has no regional  wall motion abnormalities. There is mild concentric left ventricular  hypertrophy. Left ventricular diastolic  parameters are consistent with Grade II diastolic dysfunction  (pseudonormalization).   2. Right ventricular systolic function is normal. The right ventricular  size is normal.   3. At the end of the procedure, the 27 mm Watchman FLX device is  well-seated, stable, with adequate compression. There is a tiny area of  malapposition posteriorly, towards the mitral annulus at the level of the  left circumflex coronary artery. There is  a very small peri-device leak at this level (2.3 mm in width). 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 Left atrial size was moderately  dilated. No left atrial/left atrial appendage thrombus was detected.   4. 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 No evidence of mitral stenosis.   5. The aortic valve is  tricuspid. There is mild calcification of the  aortic valve. There is mild thickening of the aortic valve. Aortic valve  regurgitation is not visualized. Mild aortic valve stenosis.   6. There is Moderate (Grade III) plaque involving the descending aorta.     Neuro/Psych negative neurological ROS  negative psych ROS   GI/Hepatic Neg liver ROS, PUD,GERD  Medicated,,  Endo/Other  Hypothyroidism    Renal/GU Renal InsufficiencyRenal disease     Musculoskeletal  (+) Arthritis ,    Abdominal   Peds  Hematology  (+) Blood dyscrasia (Plavix)   Anesthesia Other Findings Day of surgery medications reviewed with the patient.  Reproductive/Obstetrics                              Anesthesia Physical Anesthesia Plan  ASA: 3  Anesthesia Plan: MAC   Post-op Pain Management: Minimal or no pain anticipated   Induction:   PONV Risk Score and Plan: 1 and TIVA and Treatment may vary due to age or medical condition  Airway Management Planned: Natural Airway and Simple Face Mask  Additional Equipment:   Intra-op Plan:   Post-operative Plan:   Informed Consent: I have reviewed the patients History and Physical, chart, labs and discussed the procedure including the risks, benefits and alternatives for the proposed anesthesia with the patient or authorized representative who has indicated his/her understanding and acceptance.     Dental advisory given  Plan Discussed with: CRNA  Anesthesia Plan Comments:        Anesthesia Quick Evaluation

## 2022-08-11 NOTE — Progress Notes (Signed)
  Echocardiogram Echocardiogram Transesophageal has been performed.  Ryan Pittman 08/11/2022, 12:42 PM

## 2022-08-11 NOTE — Anesthesia Procedure Notes (Signed)
Procedure Name: General with mask airway Date/Time: 08/11/2022 12:18 PM  Performed by: Erick Colace, CRNAPre-anesthesia Checklist: Patient identified, Emergency Drugs available, Suction available and Patient being monitored Patient Re-evaluated:Patient Re-evaluated prior to induction Oxygen Delivery Method: Nasal cannula Preoxygenation: Pre-oxygenation with 100% oxygen Induction Type: IV induction Airway Equipment and Method: Bite block Dental Injury: Teeth and Oropharynx as per pre-operative assessment

## 2022-08-14 ENCOUNTER — Encounter (HOSPITAL_COMMUNITY): Payer: Self-pay | Admitting: Cardiovascular Disease

## 2022-08-14 ENCOUNTER — Encounter: Payer: Self-pay | Admitting: Family Medicine

## 2022-08-15 ENCOUNTER — Telehealth: Payer: Self-pay

## 2022-08-15 NOTE — Telephone Encounter (Signed)
Croitoru, Dani Gobble, MD  Tonia Ghent, MD; [name], MD; Tommie Raymond, NP; Theodoro Parma, RN Looks great. No leaks around Watchman   Reviewed results with patient who verbalized understanding.   Per Dr. Antionette Char op report, reiterated to the patient to continue ASA and Plavix ideally to 6 months post-procedure. He understands to call if he has issues with bleeding prior to follow-up visit. He was grateful for call and agreed with plan.

## 2022-08-22 ENCOUNTER — Telehealth: Payer: Self-pay

## 2022-08-22 NOTE — Telephone Encounter (Signed)
Patient's wife called stating Ryan Pittman has been having worsening B/L LE Pains for the past 2 weeks but really bad episodes of pains happened this weekend. Pt describes pains as aching and weakness & rates pains 8 or 9/10. Patient's wife states he can barely walk short distances around their home without his legs giving out. Pt denies excessive coldness of legs and/or feet, new non-healing wounds,swelling and/or discoloration. Pt scheduled for u/s and OV. Wife voiced her understanding.

## 2022-08-23 ENCOUNTER — Ambulatory Visit (HOSPITAL_COMMUNITY)
Admission: RE | Admit: 2022-08-23 | Discharge: 2022-08-23 | Disposition: A | Discharge status: Home / Self Care | Payer: Medicare Other | Source: Ambulatory Visit | Attending: Surgery | Admitting: Surgery

## 2022-08-23 ENCOUNTER — Ambulatory Visit (INDEPENDENT_AMBULATORY_CARE_PROVIDER_SITE_OTHER)
Admission: RE | Admit: 2022-08-23 | Discharge: 2022-08-23 | Disposition: A | Discharge status: Home / Self Care | Payer: Medicare Other | Source: Ambulatory Visit | Attending: Vascular Surgery | Admitting: Vascular Surgery

## 2022-08-23 DIAGNOSIS — I70213 Atherosclerosis of native arteries of extremities with intermittent claudication, bilateral legs: Secondary | ICD-10-CM | POA: Insufficient documentation

## 2022-08-23 DIAGNOSIS — I739 Peripheral vascular disease, unspecified: Secondary | ICD-10-CM | POA: Insufficient documentation

## 2022-08-24 ENCOUNTER — Ambulatory Visit (INDEPENDENT_AMBULATORY_CARE_PROVIDER_SITE_OTHER)
Admission: RE | Admit: 2022-08-24 | Discharge: 2022-08-24 | Disposition: A | Discharge status: Home / Self Care | Payer: Medicare Other | Source: Ambulatory Visit | Attending: Family Medicine | Admitting: Family Medicine

## 2022-08-24 ENCOUNTER — Ambulatory Visit (INDEPENDENT_AMBULATORY_CARE_PROVIDER_SITE_OTHER): Payer: Medicare Other | Admitting: Physician Assistant

## 2022-08-24 ENCOUNTER — Encounter: Payer: Self-pay | Admitting: Family Medicine

## 2022-08-24 ENCOUNTER — Ambulatory Visit (INDEPENDENT_AMBULATORY_CARE_PROVIDER_SITE_OTHER): Payer: Medicare Other | Admitting: Family Medicine

## 2022-08-24 VITALS — BP 122/48 | HR 82 | Temp 96.9°F | Ht 67.0 in | Wt 159.0 lb

## 2022-08-24 VITALS — BP 140/67 | HR 77 | Temp 98.0°F | Resp 20 | Ht 67.0 in | Wt 160.0 lb

## 2022-08-24 DIAGNOSIS — I701 Atherosclerosis of renal artery: Secondary | ICD-10-CM

## 2022-08-24 DIAGNOSIS — R0789 Other chest pain: Secondary | ICD-10-CM

## 2022-08-24 DIAGNOSIS — R079 Chest pain, unspecified: Secondary | ICD-10-CM | POA: Diagnosis not present

## 2022-08-24 DIAGNOSIS — D649 Anemia, unspecified: Secondary | ICD-10-CM | POA: Diagnosis not present

## 2022-08-24 DIAGNOSIS — R0602 Shortness of breath: Secondary | ICD-10-CM

## 2022-08-24 DIAGNOSIS — I739 Peripheral vascular disease, unspecified: Secondary | ICD-10-CM

## 2022-08-24 DIAGNOSIS — R29898 Other symptoms and signs involving the musculoskeletal system: Secondary | ICD-10-CM

## 2022-08-24 DIAGNOSIS — I70213 Atherosclerosis of native arteries of extremities with intermittent claudication, bilateral legs: Secondary | ICD-10-CM | POA: Diagnosis not present

## 2022-08-24 MED ORDER — LOSARTAN POTASSIUM 50 MG PO TABS: ORAL_TABLET | ORAL | Status: DC

## 2022-08-24 NOTE — Patient Instructions (Signed)
Go to the lab on the way out.   If you have mychart we'll likely use that to update you.    Hold losartan for now.  If your BP is above 140/90, then restart it.  Take care.  Glad to see you. I'll update cardiology.

## 2022-08-24 NOTE — Progress Notes (Signed)
Office Note     CC:  follow up Requesting Provider:  Tonia Ghent, MD  HPI: Ryan Pittman is a 81 y.o. (1941-08-06) male who was added on as a triage appointment with complaints of bilateral lower extremity pain, weakness, and shortness of breath.  He has history of right iliofemoral endarterectomy with bovine patch angioplasty in June 2021 by Dr. Trula Slade.  In October 2021 he underwent drug-coated balloon angioplasty of the right external iliac artery and drug-coated balloon angioplasty of the SFA by Dr. Trula Slade.  He has also had left femoral to below the knee popliteal bypass with vein by Dr. Lucky Cowboy.  Over the past week and a half he has developed pain in his calfs when walking.  At rest he does not feel this pain however he does also complain of bilateral lower extremity weakness.  Patient however states his most concerning symptom is of shortness of breath with minimal activity.  This has been over the same timeframe of 1 to 2 weeks.  It should be also be noted that he has some discomfort in his left chest which he feels is related to recent Watchman procedure.  Discomfort is reproducible when stretching his neck to the right however he states this discomfort can be present at rest as well.  He has a follow-up with his PCP this afternoon.  He denies any rest pain or tissue loss of bilateral lower extremities.  He is on aspirin and Plavix daily.  He had been on Eliquis for atrial fibrillation however this was discontinued due to GI bleed  Past Medical History:  Diagnosis Date   Anemia    due to GIB, s/p transfusion   Anginal pain ([place]) 07/10/2018   Arthritis    back pain, much worse after consecutive golf rounds   Cancer [facility])    thyroid   Chronic kidney disease    Colon polyps    Coronary artery disease    COVID-19    Dyspnea    walking up a hill   Dysrhythmia 2020   GERD (gastroesophageal reflux disease)    Hypercalcemia    h/o, resolved as of 2012, prev due to high amount of  calcium intake   Hyperlipidemia    Hypertension    Hypothyroidism    IgG gammopathy    stable as of 2012 per [facility]   Pneumonia    as a child   Presence of Watchman left atrial appendage closure device 06/29/2022   Watchman FLX 30m with Dr. CBurt Knack  PVD (peripheral vascular disease) ([place]    L leg bypass, R leg stented    Past Surgical History:  Procedure Laterality Date    dental implant left bottom-did have bleeding      ABDOMINAL AORTOGRAM W/LOWER EXTREMITY Bilateral 06/26/2019   Procedure: ABDOMINAL AORTOGRAM W/LOWER EXTREMITY;  Surgeon: BLorretta Harp MD;  [place] [place];  Service: Cardiovascular;  Laterality: Bilateral;   ABDOMINAL AORTOGRAM W/LOWER EXTREMITY N/A 06/08/2020   Procedure: ABDOMINAL AORTOGRAM W/LOWER EXTREMITY;  Surgeon: BSerafina Mitchell MD;  [place];  Service: Cardiovascular;  Laterality: N/A;   BYPASS GRAFT     L leg   CARDIAC CATHETERIZATION  06/24/2018   COLONOSCOPY N/A 10/09/2021   Procedure: COLONOSCOPY;  Surgeon: Toledo, TBenay Pike MD;  Location: [facility] ENDOSCOPY;  Service: Gastroenterology;  Laterality: N/A;   COLONOSCOPY WITH PROPOFOL N/A 05/04/2015   Procedure: COLONOSCOPY WITH PROPOFOL;  Surgeon: MLollie Sails MD;  [facility];  Service: Endoscopy;  Laterality: N/A;   CORONARY ARTERY BYPASS GRAFT N/A 07/11/2018   Procedure: CORONARY ARTERY BYPASS GRAFTING (CABG) x three, using left internal mammary artery and right leg greater saphenous vein harvested endoscopically;  Surgeon: Gaye Pollack, MD;  Location: [place] OR;  Service: Open Heart Surgery;  Laterality: N/A;   ENDARTERECTOMY FEMORAL Right 02/13/2020   Procedure: RIGHT ENDARTERECTOMY FEMORAL WITH BOVINE PATCH;  Surgeon: Serafina Mitchell, MD;  [facility];  Service: Vascular;  Laterality: Right;   ESOPHAGOGASTRODUODENOSCOPY N/A 10/09/2021   Procedure: ESOPHAGOGASTRODUODENOSCOPY (EGD);  Surgeon: Toledo, Benay Pike, MD;  Location: [facility] ENDOSCOPY;   Service: Gastroenterology;  Laterality: N/A;   GIVENS CAPSULE STUDY N/A 10/09/2021   Procedure: GIVENS CAPSULE STUDY;  Surgeon: Toledo, Benay Pike, MD;  Location: [facility] ENDOSCOPY;  Service: Gastroenterology;  Laterality: N/A;   LEFT ATRIAL APPENDAGE OCCLUSION N/A 06/29/2022   Procedure: LEFT ATRIAL APPENDAGE OCCLUSION;  Surgeon: [name], MD;  Location: [place] [facility] LAB;  Service: Cardiovascular;  Laterality: N/A;   LEFT HEART CATH AND CORONARY ANGIOGRAPHY N/A 06/24/2018   Procedure: LEFT HEART CATH AND CORONARY ANGIOGRAPHY;  Surgeon: [name], MD;  Location: [place] [facility] LAB;  Service: Cardiovascular;  Laterality: N/A;   LOWER EXTREMITY ANGIOGRAPHY Bilateral 10/25/2021   Procedure: Lower Extremity Angiography;  Surgeon: Serafina Mitchell, MD;  Location: [place] [facility] LAB;  Service: Cardiovascular;  Laterality: Bilateral;   PERIPHERAL VASCULAR ATHERECTOMY Right 06/08/2020   Procedure: PERIPHERAL VASCULAR ATHERECTOMY;  Surgeon: Serafina Mitchell, MD;  Location: [place] [facility] LAB;  Service: Cardiovascular;  Laterality: Right;  superficial femoral   PERIPHERAL VASCULAR BALLOON ANGIOPLASTY Right 06/08/2020   Procedure: PERIPHERAL VASCULAR BALLOON ANGIOPLASTY;  Surgeon: Serafina Mitchell, MD;  Location: [place] CV LAB;  Service: Cardiovascular;  Laterality: Right;  External iliac   PERIPHERAL VASCULAR INTERVENTION Right 10/25/2021   Procedure: PERIPHERAL VASCULAR INTERVENTION;  Surgeon: Serafina Mitchell, MD;  Location: [place] CV LAB;  Service: Cardiovascular;  Laterality: Right;  SFA   RENAL ANGIOGRAPHY Bilateral 10/25/2021   Procedure: RENAL ANGIOGRAPHY;  Surgeon: Serafina Mitchell, MD;  Location: [place] CV LAB;  Service: Cardiovascular;  Laterality: Bilateral;   TEE WITHOUT CARDIOVERSION N/A 07/11/2018   Procedure: TRANSESOPHAGEAL ECHOCARDIOGRAM (TEE);  Surgeon: Gaye Pollack, MD;  Location: [place];  Service: Open Heart Surgery;  Laterality: N/A;   TEE WITHOUT CARDIOVERSION N/A  06/29/2022   Procedure: TRANSESOPHAGEAL ECHOCARDIOGRAM (TEE);  Surgeon: [name], MD;  Location: [place] [facility] LAB;  Service: Cardiovascular;  Laterality: N/A;   TEE WITHOUT CARDIOVERSION N/A 08/11/2022   Procedure: TRANSESOPHAGEAL ECHOCARDIOGRAM (TEE);  Surgeon: Sanda Klein, MD;  Location: [facility] ENDOSCOPY;  Service: Cardiovascular;  Laterality: N/A;   THYROIDECTOMY, PARTIAL  2016   VASCULAR SURGERY      Social History   Socioeconomic History   Marital status: Married    Spouse name: Not on file   Number of children: Not on file   Years of education: Not on file   Highest education level: Not on file  Occupational History   Not on file  Tobacco Use   Smoking status: Former    Types: Cigarettes    Quit date: 09/04/2008    Years since quitting: 13.9    Passive exposure: Never   Smokeless tobacco: Former    Types: Chew   Tobacco comments:    HAs quit tobacco products 2009  Vaping Use   Vaping Use: Never used  Substance and Sexual Activity   Alcohol  use: Yes    Comment: occ   Drug use: No   Sexual activity: Yes  Other Topics Concern   Not on file  Social History Narrative   Married 50+ years, 2 kids   [place] to play golf   Social Determinants of Health   Financial Resource Strain: Low Risk  (12/22/2021)   Overall Financial Resource Strain (CARDIA)    Difficulty of Paying Living Expenses: Not hard at all  Food Insecurity: No Food Insecurity (05/17/2022)   Hunger Vital Sign    Worried About Running Out of Food in the Last Year: Never true    Ran Out of Food in the Last Year: Never true  Transportation Needs: No Transportation Needs (07/05/2022)   PRAPARE - Hydrologist (Medical): No    Lack of Transportation (Non-Medical): No  Physical Activity: Insufficiently Active (12/22/2021)   Exercise Vital Sign    Days of Exercise per Week: 3 days    Minutes of Exercise per Session: 30 min  Stress: No Stress Concern Present  (12/22/2021)   [place]    Feeling of Stress : Not at all  Social Connections: Moderately Integrated (12/22/2021)   Social Connection and Isolation Panel [NHANES]    Frequency of Communication with Friends and Family: More than three times a week    Frequency of Social Gatherings with Friends and Family: More than three times a week    Attends Religious Services: More than 4 times per year    Active Member of [employer] or Organizations: No    Attends Archivist Meetings: Never    Marital Status: Married  Human resources officer Violence: Not At Risk (12/22/2021)   Humiliation, Afraid, Rape, and Kick questionnaire    Fear of Current or Ex-Partner: No    Emotionally Abused: No    Physically Abused: No    Sexually Abused: No    Family History  Problem Relation Age of Onset   Diabetes Mother    Stroke Father    Colon cancer Neg Hx    Prostate cancer Neg Hx     Current Outpatient Medications  Medication Sig Dispense Refill   acetaminophen (TYLENOL) 500 MG tablet Take 1,000 mg by mouth every 6 (six) hours as needed for moderate pain or headache.     amLODipine (NORVASC) 10 MG tablet Take 10 mg by mouth daily.     aspirin EC 81 MG tablet Take 1 tablet (81 mg total) by mouth daily. Swallow whole. 90 tablet 3   azithromycin (ZITHROMAX) 500 MG tablet Take 2 tablets by mouth one hour prior to dental cleanings/procedures 2 tablet 3   Cholecalciferol (DIALYVITE VITAMIN D 5000 PO) Take 5,000 Units by mouth daily.     clopidogrel (PLAVIX) 75 MG tablet Take 1 tablet (75 mg total) by mouth daily. 90 tablet 3   ferrous sulfate 325 (65 FE) MG EC tablet Take 1 tablet (325 mg total) by mouth daily. 90 tablet 1   hydrOXYzine (ATARAX) 10 MG tablet ONE-HALF TO ONE TABLET 3 TIMES DAILY AS NEEDED FOR ANXIETY 30 tablet 2   levothyroxine (SYNTHROID) 75 MCG tablet TAKE 1 TABLET BY MOUTH DAILY EXCEPT ON THURSDAYS TAKE 1.5 TABLETS (Patient taking  differently: Take 75-112.5 mcg by mouth See admin instructions. TAKE 175 mg TABLET BY MOUTH DAILY EXCEPT ON Friday TAKE 112.5 mg 1.5 TABLETS) 100 tablet 3   losartan (COZAAR) 50 MG tablet Take  1 tablet (50 mg total) by mouth daily. 60 tablet 2   metoprolol tartrate (LOPRESSOR) 25 MG tablet Take 1 tablet (25 mg total) by mouth 2 (two) times daily. 180 tablet 3   Multiple Vitamins-Minerals (MULTIVITAMIN MEN 50+) TABS Take 1 tablet by mouth daily.     pantoprazole (PROTONIX) 40 MG tablet Take 1 tablet (40 mg total) by mouth daily. 90 tablet 3   pramipexole (MIRAPEX) 1 MG tablet Take 1 mg by mouth daily as needed (restless leg).     REPATHA SURECLICK 659 MG/ML SOAJ INJECT '140MG'$  EVERY 14 DAYS 2 mL 11   tamsulosin (FLOMAX) 0.4 MG CAPS capsule Take 1 capsule (0.4 mg total) by mouth daily. 90 capsule 3   No current facility-administered medications for this visit.    Allergies  Allergen Reactions   Penicillins Itching and Other (See Comments)    PATIENT HAS HAD A PCN REACTION WITH IMMEDIATE RASH, FACIAL/TONGUE/THROAT SWELLING, SOB, OR LIGHTHEADEDNESS WITH HYPOTENSION:  #  #  YES  #  #  Has patient had a PCN reaction causing severe rash involving mucus membranes or skin necrosis: No Has patient had a PCN reaction that required hospitalization: No Has patient had a PCN reaction occurring within the last 10 years: No If all of the above answers are "NO", then may proceed with Cephalosporin use.    Ace Inhibitors Cough   Aspirin Other (See Comments)    H/o GI bleed   Celebrex [Celecoxib] Other (See Comments)    GI bleed   Lipitor [Atorvastatin] Other (See Comments)    myalgias     REVIEW OF SYSTEMS:   '[X]'$  denotes positive finding, '[ ]'$  denotes negative finding Cardiac  Comments:  Chest pain or chest pressure:    Shortness of breath upon exertion:    Short of breath when lying flat:    Irregular heart rhythm:        Vascular    Pain in calf, thigh, or hip brought on by ambulation:    Pain  in feet at night that wakes you up from your sleep:     Blood clot in your veins:    Leg swelling:         Pulmonary    Oxygen at home:    Productive cough:     Wheezing:         Neurologic    Sudden weakness in arms or legs:     Sudden numbness in arms or legs:     Sudden onset of difficulty speaking or slurred speech:    Temporary loss of vision in one eye:     Problems with dizziness:         Gastrointestinal    Blood in stool:     Vomited blood:         Genitourinary    Burning when urinating:     Blood in urine:        Psychiatric    Major depression:         Hematologic    Bleeding problems:    Problems with blood clotting too easily:        Skin    Rashes or ulcers:        Constitutional    Fever or chills:      PHYSICAL EXAMINATION:  Vitals:   08/24/22 1228  BP: (!) 140/67  Pulse: 77  Resp: 20  Temp: 98 F (36.7 C)  SpO2: 99%  Weight: 160 lb (72.6 kg)  Height: '5\' 7"'$  (1.702 m)    General:  WDWN in NAD; vital signs documented above Gait: Not observed HENT: WNL, normocephalic Pulmonary: normal non-labored breathing , without Rales, rhonchi,  wheezing Cardiac: regular HR Abdomen: soft, NT, no masses Skin: without rashes Vascular Exam/Pulses: 2+ femoral pulses right somewhat stronger than left; unable to palpate pedal pulses of the right foot however brisk PT and DP Doppler signals; palpable left DP pulse Extremities: No wounds or other tissue loss of bilateral lower extremities; pitting edema to the level of the proximal shin bilaterally Musculoskeletal: no muscle wasting or atrophy  Neurologic: A&O X 3;  No focal weakness or paresthesias are detected Psychiatric:  The pt has Normal affect.   Non-Invasive Vascular Imaging:    Right SFA stent patent  Left femoral to popliteal bypass patent however there are velocities in the 30s in the distal graft  ABI/TBIToday's ABIToday's TBIPrevious ABIPrevious TBI   +-------+-----------+-----------+------------+------------+  Right 1.08       0.45       [place]          0.42          +-------+-----------+-----------+------------+------------+  Left  [place]         0.44       [place]          0.54            ASSESSMENT/PLAN:: 81 y.o. male here for evaluation of bilateral lower extremity pain, weakness, and shortness of breath over the past 2 weeks  -Bilateral lower extremity arterial duplex demonstrates widely patent right SFA stent and an unchanged right ABI and TBI.  Left leg bypass is patent however does have concerning velocities in the distal portion and distal anastomosis.  He may require angiogram and left lower extremity runoff in the near future however this would not address symptoms of shortness of breath, left chest discomfort, and generalized weakness especially in the legs.  He has an extensive cardiac history as well as anemia with recent history of GI bleed.  He has an appointment with his PCP this afternoon for further evaluation.  He may also need to reach out to his cardiologist concerning shortness of breath and chest discomfort.  As I mentioned above he will likely require intervention of the outflow of the left leg bypass however currently I do not feel this is the top priority given his clinical presentation as well as his symptoms.  He does not have any rest pain or tissue loss of bilateral lower extremities.  We will repeat imaging studies in 3 months and discuss intervention of left leg bypass at that time.   Dagoberto Ligas, PA-C [facility] [phone]  Clinic MD:   Donzetta Matters on call

## 2022-08-24 NOTE — Progress Notes (Signed)
D/w pt about vascular plan from today:  ASSESSMENT/PLAN:: 81 y.o. male here for evaluation of bilateral lower extremity pain, weakness, and shortness of breath over the past 2 weeks   -Bilateral lower extremity arterial duplex demonstrates widely patent right SFA stent and an unchanged right ABI and TBI.  Left leg bypass is patent however does have concerning velocities in the distal portion and distal anastomosis.  He may require angiogram and left lower extremity runoff in the near future however this would not address symptoms of shortness of breath, left chest discomfort, and generalized weakness especially in the legs.  He has an extensive cardiac history as well as anemia with recent history of GI bleed.  He has an appointment with his PCP this afternoon for further evaluation.  He may also need to reach out to his cardiologist concerning shortness of breath and chest discomfort.  As I mentioned above he will likely require intervention of the outflow of the left leg bypass however currently I do not feel this is the top priority given his clinical presentation as well as his symptoms.  He does not have any rest pain or tissue loss of bilateral lower extremities.  We will repeat imaging studies in 3 months and discuss intervention of left leg bypass at that time.   ==================== Patient has noted shortness of breath, left chest discomfort, and generalized weakness especially in the legs.  He has a sensation of discomfort in the L chest with neck rotation to the L more than rotation to the R.  He felt the same sensation with riding in a truck with a hard suspension.   This has been going on intermittently for about 1 month. It isn't exertional.    He can get SOB.  H/o anemia noted.  He is occ lightheaded on standing.  This feels similar to when his blood counts are dropped previously.  He has not seen any blood in his stool.  He has black stools at baseline from iron use and that has not  changed.  Meds, vitals, and allergies reviewed.   ROS: Per HPI unless specifically indicated in ROS section   Nad Ncat Neck supple, no LA RRR SEM Ctab Abdomen soft.  Nontender. Trace BLE Skin well-perfused.

## 2022-08-25 ENCOUNTER — Other Ambulatory Visit: Payer: Self-pay | Admitting: Family Medicine

## 2022-08-25 DIAGNOSIS — D649 Anemia, unspecified: Secondary | ICD-10-CM

## 2022-08-25 LAB — COMPREHENSIVE METABOLIC PANEL
AG Ratio: 1.8 (calc) (ref 1.0–2.5)
ALT: 12 U/L (ref 9–46)
AST: 16 U/L (ref 10–35)
Albumin: 3.9 g/dL (ref 3.6–5.1)
Alkaline phosphatase (APISO): 45 U/L (ref 35–144)
BUN/Creatinine Ratio: 16 (calc) (ref 6–22)
BUN: 24 mg/dL (ref 7–25)
CO2: 22 mmol/L (ref 20–32)
Calcium: 9.1 mg/dL (ref 8.6–10.3)
Chloride: 104 mmol/L (ref 98–110)
Creat: 1.47 mg/dL — ABNORMAL HIGH (ref 0.70–1.22)
Globulin: 2.2 g/dL (calc) (ref 1.9–3.7)
Glucose, Bld: 198 mg/dL — ABNORMAL HIGH (ref 65–99)
Potassium: 4.8 mmol/L (ref 3.5–5.3)
Sodium: 137 mmol/L (ref 135–146)
Total Bilirubin: 0.3 mg/dL (ref 0.2–1.2)
Total Protein: 6.1 g/dL (ref 6.1–8.1)

## 2022-08-25 LAB — CBC WITH DIFFERENTIAL/PLATELET
Absolute Monocytes: 717 cells/uL (ref 200–950)
Basophils Absolute: 77 cells/uL (ref 0–200)
Basophils Relative: 0.6 %
Eosinophils Absolute: 51 cells/uL (ref 15–500)
Eosinophils Relative: 0.4 %
HCT: 25.4 % — ABNORMAL LOW (ref 38.5–50.0)
Hemoglobin: 8.6 g/dL — ABNORMAL LOW (ref 13.2–17.1)
Lymphs Abs: 1421 cells/uL (ref 850–3900)
MCH: 32.5 pg (ref 27.0–33.0)
MCHC: 33.9 g/dL (ref 32.0–36.0)
MCV: 95.8 fL (ref 80.0–100.0)
MPV: 10.6 fL (ref 7.5–12.5)
Monocytes Relative: 5.6 %
Neutro Abs: 10534 cells/uL — ABNORMAL HIGH (ref 1500–7800)
Neutrophils Relative %: 82.3 %
Platelets: 283 10*3/uL (ref 140–400)
RBC: 2.65 10*6/uL — ABNORMAL LOW (ref 4.20–5.80)
RDW: 15.2 % — ABNORMAL HIGH (ref 11.0–15.0)
Total Lymphocyte: 11.1 %
WBC: 12.8 10*3/uL — ABNORMAL HIGH (ref 3.8–10.8)

## 2022-08-25 LAB — IRON: Iron: 138 ug/dL (ref 50–180)

## 2022-08-25 LAB — FERRITIN: Ferritin: 43 ng/mL (ref 24–380)

## 2022-08-25 LAB — BRAIN NATRIURETIC PEPTIDE: Brain Natriuretic Peptide: 109 pg/mL — ABNORMAL HIGH (ref <100)

## 2022-08-25 MED ORDER — ASPIRIN 81 MG PO TBEC: DELAYED_RELEASE_TABLET | ORAL | 3 refills | Status: DC

## 2022-08-27 DIAGNOSIS — D649 Anemia, unspecified: Secondary | ICD-10-CM | POA: Insufficient documentation

## 2022-08-27 NOTE — Assessment & Plan Note (Signed)
See notes on labs.  It appears that his chest symptoms are musculoskeletal but I suspect his other symptoms are related to/exacerbated by anemia.  I routed a copy of his results to cardiology in the meantime.  His hemoglobin is lower again and this would explain a lot of his symptoms.  His kidney function is slightly worse and I suspect that is exacerbated by his lower hemoglobin level.     If he is feeling worse at any point then I want him to go to the emergency room because he may need to be transfused.  It does not appear that he needs transfusion right now.     If he is not feeling worse then I would stop aspirin and Plavix for the next 3 doses.  If he does not feel worse at that point, then I would restart Plavix (but not restart aspirin).  At that point he would be on Plavix alone.   He is going to need to get his blood counts rechecked early next week.  We will work on getting that set up.  At this point appears he is still okay for outpatient follow-up. 30 minutes were devoted to patient care in this encounter (this includes time spent reviewing the patient's file/history, interviewing and examining the patient, counseling/reviewing plan with patient).

## 2022-08-29 ENCOUNTER — Other Ambulatory Visit (INDEPENDENT_AMBULATORY_CARE_PROVIDER_SITE_OTHER): Payer: Medicare Other

## 2022-08-29 DIAGNOSIS — D649 Anemia, unspecified: Secondary | ICD-10-CM

## 2022-08-29 LAB — BASIC METABOLIC PANEL
BUN: 22 mg/dL (ref 6–23)
CO2: 25 mEq/L (ref 19–32)
Calcium: 8.9 mg/dL (ref 8.4–10.5)
Chloride: 108 mEq/L (ref 96–112)
Creatinine, Ser: 1.28 mg/dL (ref 0.40–1.50)
GFR: 51.78 mL/min — ABNORMAL LOW (ref >60.00)
Glucose, Bld: 101 mg/dL — ABNORMAL HIGH (ref 70–99)
Potassium: 4.3 mEq/L (ref 3.5–5.1)
Sodium: 143 mEq/L (ref 135–145)

## 2022-08-29 LAB — CBC WITH DIFFERENTIAL/PLATELET
Basophils Absolute: 0.1 10*3/uL (ref 0.0–0.1)
Basophils Relative: 1 % (ref 0.0–3.0)
Eosinophils Absolute: 0.2 10*3/uL (ref 0.0–0.7)
Eosinophils Relative: 2.2 % (ref 0.0–5.0)
HCT: 26.5 % — ABNORMAL LOW (ref 39.0–52.0)
Hemoglobin: 9 g/dL — ABNORMAL LOW (ref 13.0–17.0)
Lymphocytes Relative: 16 % (ref 12.0–46.0)
Lymphs Abs: 1.7 10*3/uL (ref 0.7–4.0)
MCHC: 33.8 g/dL (ref 30.0–36.0)
MCV: 95.9 fl (ref 78.0–100.0)
Monocytes Absolute: 0.8 10*3/uL (ref 0.1–1.0)
Monocytes Relative: 7 % (ref 3.0–12.0)
Neutro Abs: 8 10*3/uL — ABNORMAL HIGH (ref 1.4–7.7)
Neutrophils Relative %: 73.8 % (ref 43.0–77.0)
Platelets: 282 10*3/uL (ref 150.0–400.0)
RBC: 2.76 Mil/uL — ABNORMAL LOW (ref 4.22–5.81)
RDW: 17 % — ABNORMAL HIGH (ref 11.5–15.5)
WBC: 10.9 10*3/uL — ABNORMAL HIGH (ref 4.0–10.5)

## 2022-08-30 ENCOUNTER — Telehealth: Payer: Self-pay

## 2022-08-30 ENCOUNTER — Other Ambulatory Visit: Payer: Self-pay

## 2022-08-30 DIAGNOSIS — I70213 Atherosclerosis of native arteries of extremities with intermittent claudication, bilateral legs: Secondary | ICD-10-CM

## 2022-08-30 DIAGNOSIS — I739 Peripheral vascular disease, unspecified: Secondary | ICD-10-CM

## 2022-08-30 DIAGNOSIS — R809 Proteinuria, unspecified: Secondary | ICD-10-CM | POA: Diagnosis not present

## 2022-08-30 DIAGNOSIS — D631 Anemia in chronic kidney disease: Secondary | ICD-10-CM | POA: Diagnosis not present

## 2022-08-30 DIAGNOSIS — N1831 Chronic kidney disease, stage 3a: Secondary | ICD-10-CM | POA: Diagnosis not present

## 2022-08-30 DIAGNOSIS — I10 Essential (primary) hypertension: Secondary | ICD-10-CM | POA: Diagnosis not present

## 2022-08-30 NOTE — Telephone Encounter (Signed)
Spoke with the patient, who restarted Plavix only today. Instructed him not to take ASA anymore and it will be removed from his med list. He was grateful for call.

## 2022-08-30 NOTE — Telephone Encounter (Signed)
-----   Message from [name], MD sent at 08/28/2022  5:24 PM EST ----- Agree with Dr Josefine Class comments. He is 8 weeks out from [place] with a complete seal on follow-up TEE. Reasonable to start him back on plavix alone at this point.

## 2022-08-31 ENCOUNTER — Other Ambulatory Visit: Payer: Self-pay | Admitting: Family Medicine

## 2022-08-31 DIAGNOSIS — D649 Anemia, unspecified: Secondary | ICD-10-CM

## 2022-09-05 ENCOUNTER — Other Ambulatory Visit: Payer: Self-pay | Admitting: Family Medicine

## 2022-09-12 ENCOUNTER — Other Ambulatory Visit (INDEPENDENT_AMBULATORY_CARE_PROVIDER_SITE_OTHER): Payer: Medicare Other

## 2022-09-12 ENCOUNTER — Encounter: Payer: Self-pay | Admitting: Oncology

## 2022-09-12 DIAGNOSIS — D649 Anemia, unspecified: Secondary | ICD-10-CM

## 2022-09-12 LAB — CBC WITH DIFFERENTIAL/PLATELET
Basophils Absolute: 0.1 10*3/uL (ref 0.0–0.1)
Basophils Relative: 1.1 % (ref 0.0–3.0)
Eosinophils Absolute: 0.3 10*3/uL (ref 0.0–0.7)
Eosinophils Relative: 2.4 % (ref 0.0–5.0)
HCT: 31.1 % — ABNORMAL LOW (ref 39.0–52.0)
Hemoglobin: 10.1 g/dL — ABNORMAL LOW (ref 13.0–17.0)
Lymphocytes Relative: 13.2 % (ref 12.0–46.0)
Lymphs Abs: 1.6 10*3/uL (ref 0.7–4.0)
MCHC: 32.6 g/dL (ref 30.0–36.0)
MCV: 94.7 fl (ref 78.0–100.0)
Monocytes Absolute: 1.1 10*3/uL — ABNORMAL HIGH (ref 0.1–1.0)
Monocytes Relative: 8.7 % (ref 3.0–12.0)
Neutro Abs: 9 10*3/uL — ABNORMAL HIGH (ref 1.4–7.7)
Neutrophils Relative %: 74.6 % (ref 43.0–77.0)
Platelets: 286 10*3/uL (ref 150.0–400.0)
RBC: 3.29 Mil/uL — ABNORMAL LOW (ref 4.22–5.81)
RDW: 15.7 % — ABNORMAL HIGH (ref 11.5–15.5)
WBC: 12 10*3/uL — ABNORMAL HIGH (ref 4.0–10.5)

## 2022-09-14 ENCOUNTER — Other Ambulatory Visit: Payer: Self-pay | Admitting: Family Medicine

## 2022-09-14 DIAGNOSIS — D649 Anemia, unspecified: Secondary | ICD-10-CM

## 2022-09-19 ENCOUNTER — Telehealth: Payer: Self-pay | Admitting: Family Medicine

## 2022-09-19 DIAGNOSIS — E782 Mixed hyperlipidemia: Secondary | ICD-10-CM

## 2022-09-19 DIAGNOSIS — R739 Hyperglycemia, unspecified: Secondary | ICD-10-CM

## 2022-09-19 NOTE — Telephone Encounter (Signed)
Ordered. Thanks

## 2022-09-19 NOTE — Addendum Note (Signed)
Addended by: Tonia Ghent on: 09/19/2022 01:49 PM   Modules accepted: Orders

## 2022-09-19 NOTE — Telephone Encounter (Signed)
Called and informed pt of this information. 

## 2022-09-19 NOTE — Telephone Encounter (Signed)
Pt called to reschedule his upcoming lab appt in February. Pt asked could Dr. Damita Dunnings aslo check for his sugar & cholesterol levels? Call back # [phone]

## 2022-09-21 ENCOUNTER — Other Ambulatory Visit: Payer: Self-pay | Admitting: Family Medicine

## 2022-10-03 ENCOUNTER — Telehealth: Payer: Self-pay

## 2022-10-03 NOTE — Progress Notes (Addendum)
Care Management & Coordination Services Pharmacy Team  Reason for Encounter: Appointment Reminder  Contacted patient to confirm telephone appointment with [name],   PharmD, on 10/06/22 at 1:45. Unsuccessful outreach. Left voicemail for patient to return call. Patient returned the call and confirmed appointment.   Hospital visits:  08/11/22- [facility] 06/30/22-[facility] -Watchman procedure implant     Star Rating Drugs:  Medication:  Last Fill: Day Supply Losartan '50mg'$  06/30/22 60   Care Gaps: Annual wellness visit in last year? Yes   [name], PharmD notified  [name], [place] Assistant [phone]

## 2022-10-06 ENCOUNTER — Ambulatory Visit: Payer: Medicare Other | Admitting: Pharmacist

## 2022-10-06 NOTE — Progress Notes (Signed)
Care Management & Coordination Services Pharmacy Note  10/06/2022 Name:  Ryan Pittman MRN:  [id number] DOB:  October 17, 1940  Summary: F/U visit -HTN: Home BP is at goal per pt report (SBP 128-130s); he is taking amlodipine 10 mg now, unclear when he restarted this as he was off it for several months last year (last fill date 09/06/21 x 90 ds); it was added back to his med list 12/7 as patient-reported med; on 12/21 he saw PCP, decided to hold losartan for low BP -CKD Stage 3a: pt would benefit from ACE/ARB per nephrologist last note; given BP is at goal now it is reasonable to continue current meds for now, would consider reduce or d/c amlodipine to make room for losartan in future -Anemia - HGB slowly improving, latest 10.1 with plan to recheck later this month -Discussed importance of daily PPI (pantoprazole) given hx of GI bleed, on Plavix monotherapy now   Recommendations/Changes made from today's visit: -No med changes; keep lab appt 10/23/22 -Advised to discuss optimal BP regimen with nephrologist  Follow up plan: -Pharmacist follow up televisit scheduled for 3 months -LAB appt 10/23/22 (CBC, lipids, A1c); Urology appt 11/17/22; AWV 12/25/22; Cardiology appt 12/27/22    Subjective: Ryan Pittman is an 82 y.o. year old male who is a primary patient of Tonia Ghent, MD.  The care coordination team was consulted for assistance with disease management and care coordination needs.    Engaged with patient by telephone for follow up visit.  Recent office visits: 08/24/22 Dr Damita Dunnings OV: anemia - HGB 8.6. Stop ASA and plavix for 3 doses. Restart Plavix monotherapy. Repeat CBC 1 week - HGB 9.0. Repeat CBC 1 week - HGB 10.1.  Recent consult visits: 08/30/22 Dr Holley Raring (Nephrology): f/u - CKD stable. Off losartan d/t low BP. Re-eval next visit.  08/24/22 PA Eveland (Vasc Surg): PAD - triage appt, bilateral LE pain, weakness, SOB.   07/31/22 NP Kathyrn Drown (Cardiology): f/u - continue DAPT.  Rx azithromycin for dental SBE.  07/03/22 PA Laurence Slate (Vasc surg): PAD - stable baseline. Continue DAPT.  05/31/22 Dr Burt Knack (Cardiology): pre-op eval. Cannot tolerate Eliquis 2.5 mg. Start clopidogrel.   05/09/22 PA Christell Faith (Cardiology): f/u CAD - refer for [facility] visits: 06/30/22 Admission - Watchman procedure   Objective:  Lab Results  Component Value Date   CREATININE 1.28 08/29/2022   BUN 22 08/29/2022   GFR 51.78 (L) 08/29/2022   EGFR 63 05/31/2022   GFRNONAA 59 (L) 08/08/2022   GFRAA >60 02/15/2020   NA 143 08/29/2022   K 4.3 08/29/2022   CALCIUM 8.9 08/29/2022   CO2 25 08/29/2022   GLUCOSE 101 (H) 08/29/2022    Lab Results  Component Value Date/Time   HGBA1C 6.1 (H) 07/14/2019 08:30 AM   HGBA1C 5.7 (H) 07/10/2018 11:41 AM   GFR 51.78 (L) 08/29/2022 08:06 AM   GFR 50.16 (L) 12/02/2021 09:17 AM    Last diabetic Eye exam: No results found for: "HMDIABEYEEXA"  Last diabetic Foot exam: No results found for: "HMDIABFOOTEX"   Lab Results  Component Value Date   CHOL 145 08/05/2021   HDL 78 08/05/2021   LDLCALC 53 08/05/2021   LDLDIRECT 41.0 12/30/2020   TRIG 72 08/05/2021   CHOLHDL 1.9 08/05/2021       Latest Ref Rng & Units 08/24/2022    3:47 PM 12/07/2021   12:12 PM 11/22/2021    5:53 PM  Hepatic Function  Total Protein 6.1 - 8.1 g/dL  6.1  6.7  6.2   Albumin 3.5 - 5.0 g/dL  4.2  3.8   AST 10 - 35 U/L '16  18  16   '$ ALT 9 - 46 U/L '12  14  14   '$ Alk Phosphatase 38 - 126 U/L  41  36   Total Bilirubin 0.2 - 1.2 mg/dL 0.3  0.1  0.4     Lab Results  Component Value Date/Time   TSH 2.246 08/25/2021 03:17 AM   TSH 1.42 12/30/2020 11:28 AM   TSH 4.04 04/08/2020 08:58 AM       Latest Ref Rng & Units 09/12/2022   10:02 AM 08/29/2022    8:06 AM 08/24/2022    3:47 PM  CBC  WBC 4.0 - 10.5 K/uL 12.0  10.9  12.8   Hemoglobin 13.0 - 17.0 g/dL 10.1  9.0  8.6   Hematocrit 39.0 - 52.0 % 31.1  26.5 Repeated and verified X2.  25.4   Platelets 150.0 -  400.0 K/uL 286.0  282.0  283    Iron/TIBC/Ferritin/ %Sat    Component Value Date/Time   IRON 138 08/24/2022 1547   TIBC 375 02/06/2022 1049   FERRITIN 43 08/24/2022 1547   IRONPCTSAT 25 02/06/2022 1049    Lab Results  Component Value Date/Time   VITAMINB12 595 12/07/2021 12:12 PM   VITAMINB12 724 08/11/2019 06:57 PM    Clinical ASCVD: Yes  The ASCVD Risk score (Arnett DK, et al., 2019) failed to calculate for the following reasons:   The 2019 ASCVD risk score is only valid for ages 58 to 48    CHA2DS2/VAS Stroke Risk Points  Current as of 2 days ago (Wednesday)     4 >= 2 Points: High Risk  1 - 1.99 Points: Medium Risk  0 Points: Low Risk    Last Change: N/A      Points Metrics  0 Has Congestive Heart Failure:  No    Current as of 2 days ago (Wednesday)  1 Has Vascular Disease:  Yes    Current as of 2 days ago (Wednesday)  1 Has Hypertension:  Yes    Current as of 2 days ago (Wednesday)  2 Age:  82    Current as of 2 days ago (Wednesday)  0 Has Diabetes:  No    Current as of 2 days ago (Wednesday)  0 Had Stroke:  No  Had TIA:  No  Had Thromboembolism:  No    Current as of 2 days ago (Wednesday)  0 Male:  No    Current as of 2 days ago (Wednesday)       12/22/2021    3:32 PM 12/21/2020   12:02 PM 04/25/2019   12:15 PM  Depression screen PHQ 2/9  Decreased Interest 0 0 0  Down, Depressed, Hopeless 0 0 0  PHQ - 2 Score 0 0 0  Altered sleeping  0   Tired, decreased energy  0   Change in appetite  0   Feeling bad or failure about yourself   0   Trouble concentrating  0   Moving slowly or fidgety/restless  0   Suicidal thoughts  0   PHQ-9 Score  0   Difficult doing work/chores  Not difficult at all      Social History   Tobacco Use  Smoking Status Former   Types: Cigarettes   Quit date: 09/04/2008   Years since quitting: 14.0   Passive exposure: Never  Smokeless Tobacco Former  Types: Chew  Tobacco Comments   HAs quit tobacco products 2009   BP  Readings from Last 3 Encounters:  08/24/22 (!) 122/48  08/24/22 (!) 140/67  08/11/22 133/79   Pulse Readings from Last 3 Encounters:  08/24/22 82  08/24/22 77  08/11/22 (!) 54   Wt Readings from Last 3 Encounters:  08/24/22 159 lb (72.1 kg)  08/24/22 160 lb (72.6 kg)  08/11/22 150 lb (68 kg)   BMI Readings from Last 3 Encounters:  08/24/22 24.90 kg/m  08/24/22 25.06 kg/m  08/11/22 23.49 kg/m    Allergies  Allergen Reactions   Penicillins Itching and Other (See Comments)    PATIENT HAS HAD A PCN REACTION WITH IMMEDIATE RASH, FACIAL/TONGUE/THROAT SWELLING, SOB, OR LIGHTHEADEDNESS WITH HYPOTENSION:  #  #  YES  #  #  Has patient had a PCN reaction causing severe rash involving mucus membranes or skin necrosis: No Has patient had a PCN reaction that required hospitalization: No Has patient had a PCN reaction occurring within the last 10 years: No If all of the above answers are "NO", then may proceed with Cephalosporin use.    Ace Inhibitors Cough   Aspirin Other (See Comments)    H/o GI bleed   Celebrex [Celecoxib] Other (See Comments)    GI bleed   Lipitor [Atorvastatin] Other (See Comments)    myalgias    Medications Reviewed Today     Reviewed by Charlton Haws, [facility] (Pharmacist) on 10/06/22 at 1447  Med List Status: <None>   Medication Order Taking? Sig Documenting Provider Last Dose Status Informant  acetaminophen (TYLENOL) 500 MG tablet [id number] Yes Take 1,000 mg by mouth every 6 (six) hours as needed for moderate pain or headache. [provider] Taking Active Self  amLODipine (NORVASC) 10 MG tablet [id number] Yes TAKE 1 TABLET BY MOUTH DAILY Tonia Ghent, MD Taking Active   azithromycin (ZITHROMAX) 500 MG tablet [id number] Yes Take 2 tablets by mouth one hour prior to dental cleanings/procedures Tommie Raymond, NP Taking Active Self           Med Note Fleet Contras Aug 24, 2022  2:25 PM)    Cholecalciferol (DIALYVITE VITAMIN D 5000  PO) [id number] Yes Take 5,000 Units by mouth daily. [provider] Taking Active Self  clopidogrel (PLAVIX) 75 MG tablet [id number] Yes Take 1 tablet (75 mg total) by mouth daily. [name], MD Taking Active Self  ferrous sulfate 325 (65 FE) MG EC tablet [id number] Yes Take 1 tablet (325 mg total) by mouth daily. Earlie Server, MD Taking Active Self  hydrOXYzine (ATARAX) 10 MG tablet [id number] Yes ONE-HALF TO ONE TABLET 3 TIMES DAILY AS NEEDED FOR ANXIETY Tonia Ghent, MD Taking Active   levothyroxine (SYNTHROID) 75 MCG tablet [id number] Yes TAKE 1 TABLET BY MOUTH DAILY EXCEPT ON THURSDAYS TAKE 1.5 TABLETS Tonia Ghent, MD Taking Active Self  losartan (COZAAR) 50 MG tablet [id number] No Held as of 08/24/22  Patient not taking: Reported on 10/06/2022   Tonia Ghent, MD Not Taking Active   metoprolol tartrate (LOPRESSOR) 25 MG tablet [id number] Yes Take 1 tablet (25 mg total) by mouth 2 (two) times daily. Almyra Deforest, [place] Taking Active Self  Multiple Vitamins-Minerals (MULTIVITAMIN MEN 50+) TABS [id number] Yes Take 1 tablet by mouth daily. [provider] Taking Active Self  pantoprazole (PROTONIX) 40 MG tablet [id number] Yes Take 1 tablet (40 mg total) by mouth daily. Almyra Deforest, [place] Taking Active Self  pramipexole (MIRAPEX) 1 MG tablet [id number] Yes Take 1 mg by mouth daily as needed (restless leg). [provider] Taking Active Self  REPATHA SURECLICK 440 MG/ML Darden Palmer [id number] Yes INJECT '140MG'$  EVERY 14 DAYS [name], MD Taking Active Self  tamsulosin [facility]) 0.4 MG CAPS capsule [id number] Yes Take 1 capsule (0.4 mg total) by mouth daily. Abbie Sons, MD Taking Active Self            SDOH:  (Social Determinants of Health) assessments and interventions performed: No SDOH Interventions    Flowsheet Row Telephone from 07/05/2022 in [place] Coordination from 05/17/2022 in [place]  Coordination Clinical Support from 12/22/2021 in [place] at [place] Management from 09/28/2021 in [place] [facility] [place] from 12/21/2020 in [place] [facility] [place] from 04/04/2019 in [place] [facility] [place] Interventions -- Intervention Not Indicated Intervention Not Indicated -- -- --  Housing Interventions Intervention Not Indicated Intervention Not Indicated Intervention Not Indicated -- -- --  Transportation Interventions Intervention Not Indicated Intervention Not Indicated Intervention Not Indicated Intervention Not Indicated -- --  Depression Interventions/Treatment  -- -- -- -- PHQ2-9 Score <4 Follow-up Not Indicated PHQ2-9 Score <4 Follow-up Not Indicated  Financial Strain Interventions -- -- Intervention Not Indicated Intervention Not Indicated -- --  Physical Activity Interventions -- -- Intervention Not Indicated -- -- --  Stress Interventions -- -- Intervention Not Indicated -- -- --  Social Connections Interventions -- -- Intervention Not Indicated -- -- --       Medication Assistance: None required.  Patient affirms current coverage meets needs.  Medication Access: Within the past 30 days, how often has patient missed a dose of medication? 0 Is a pillbox or other method used to improve adherence? Yes  Factors that may affect medication adherence? no barriers identified Are meds synced by current pharmacy? No  Are meds delivered by current pharmacy? No  Does patient experience delays in picking up medications due to transportation concerns? No   [facility] Services Reviewed: Is patient disadvantaged to use [facility]?: Yes  Current Rx insurance plan: [employer] [place] Name and location of Current pharmacy:  [place], [place]  [address] Phone: [phone] Fax: [phone]  [place], [place] - [address] [address] Phone: [phone] Fax: [phone]  [facility] services reviewed with patient today?: No  Patient requests to transfer care to [facility]?: No  Reason patient declined to change pharmacies: Disadvantaged due to insurance/mail order  Compliance/Adherence/Medication fill history: Care Gaps: None  Star-Rating Drugs: Losartan - on hold   Assessment/Plan   Hypertension / CKD Stage 3a (BP goal <140/90) -Controlled - home BP is at goal per pt report; he is taking amlodipine 10 mg now, unclear when he restarted this as he was off it for several months last year (last fill date 09/06/21 x 90 ds); it was added back to his med list 12/7 as patient-reported med; on 12/21 he saw PCP, decided to hold losartan for low BP -Current home readings: SBP 128-130s -Current treatment: Losartan 50 mg daily -on hold since 08/24/22 Metoprolol tartrate 25 mg - 1 tab BID -Appropriate, Effective, Safe, Accessible Amlodipine 10  mg daily - Appropriate, Effective, Safe, Accessible -Medications previously tried: amlodipine (low BP); losartan (low BP) -Educated on BP goals and benefits of medications for prevention of heart attack, stroke and kidney damage; Daily salt intake goal < 2300 mg; Importance of home blood pressure monitoring; -Reviewed ideally pt would be on ACE/ARB for renal benefits; given BP is at goal now it is reasonable to continue current meds for now, would consider reduce or d/c amlodipine to make room for losartan in future -Counseled to monitor BP at home daily -Recommended to continue current medication  Hyperlipidemia: (LDL goal < 70) -Controlled - LDL 53 (08/2021) at goal; pt endorses compliance with current regimen; he reports Repatha is currently affordable; hx statin intolerance -Hx CAD (CABGx 5 2019), PAD s/p multiple stents. Not on  aspirin anymore. -Follows with cardiology, vascular regularly -Current treatment: Repatha Sureclick 381 mg R71 days -Appropriate, Effective, Safe, Accessible Clopidogrel 75 mg daily - Appropriate, Effective, Safe, Accessible -Medications previously tried: aspirin (GI bleed), atorvastatin -Educated on Cholesterol goals;  benefits of aspirin given multiple stents and hx CABG -Reviewed affordability - pt reports no issues getting Repatha -Recommended to continue current medication  Atrial Fibrillation (Goal: prevent stroke and major bleeding) -Controlled  -CHADSVASC: 4; hx multiple GI bleed (most recent 10/2021); -Watchman procedure 06/30/22 -Current treatment: Metoprolol tartrate 25 mg BID -Appropriate, Effective, Safe, Accessible -Medications previously tried: amiodarone, digoxin, Eliquis -Counseled on increased risk of stroke due to Afib and benefits of anticoagulation for stroke prevention; importance of adherence to anticoagulant exactly as prescribed; bleeding risk associated with Eliquis and importance of self-monitoring for signs/symptoms of bleeding; avoidance of NSAIDs due to increased bleeding risk with anticoagulants; seeking medical attention after a head injury or if there is blood in the urine/stool; -Recommended to continue current medication  Hx GI Bleed / Diverticulosis (Goal: prevent recurrence) -Controlled - pt is not always compliant with pantoprazole -GI Bleed 10/2021 on Eliquis/aspirin, decision made per cardiology to continue anticoagulation -Current treatment  Pantoprazole 40 mg daily - Appropriate, Effective, Safe, Accessible -Medications previously tried: omeprazole -Discussed benefits of PPI given hx of GI bleed and current Plavix; advised to continue daily PPI  Anemia  -Improving - HGB 10.1 (09/12/22) improving off of aspirin; pt has repeat CBC scheduled in 2 weeks -Current treatment  Ferrous sulfate 325 mg daily - Appropriate, Effective, Safe,  Accessible -Medications previously tried: n/a  -Recommended to continue current medication  Hypothyroidism (Goal: maintain TSH in goal range) -Controlled - TSH is at goal; pt endorses compliance with current regimen; he has seen endocrinology previously for thyroid issues but has not been back in over a year (last OV 06/2020), he has about 5 days remaining on current prescription and has not been able to get a refill -Current treatment  Levothyroxine 75 mcg daily except 1.5 tab once a week -Appropriate, Effective, Safe, Accessible -Advised pt to contact endocrine office for refill, he may need a f/u appt -Recommended to continue current medication  Health Maintenance -Vaccine gaps: Flu, Shingrix, covid booster -Hx of Thyroid cancer, basal cell carcinoma  [name], PharmD, BCACP Clinical Pharmacist [facility] at [facility] [phone]

## 2022-10-06 NOTE — Patient Instructions (Addendum)
Visit Information  Phone number for Pharmacist: [phone]  Thank you for meeting with me to discuss your medications! Below is a summary of what we talked about during the visit:     Recommendations/Changes made from today's visit: -No med changes; keep lab appt 10/23/22 -Advised to discuss optimal BP regimen with nephrologist  Follow up plan: -Pharmacist follow up televisit scheduled for 3 months -LAB appt 10/23/22 (CBC, lipids, A1c); Urology appt 11/17/22; AWV 12/25/22; Cardiology appt 12/27/22   [name], PharmD, BCACP Clinical Pharmacist [facility] at [facility] [phone]

## 2022-10-10 ENCOUNTER — Other Ambulatory Visit: Payer: Self-pay | Admitting: Oncology

## 2022-10-16 ENCOUNTER — Other Ambulatory Visit: Payer: BLUE CROSS/BLUE SHIELD

## 2022-10-20 ENCOUNTER — Encounter: Payer: Self-pay | Admitting: Oncology

## 2022-10-23 ENCOUNTER — Other Ambulatory Visit (INDEPENDENT_AMBULATORY_CARE_PROVIDER_SITE_OTHER): Payer: Medicare Other

## 2022-10-23 DIAGNOSIS — R739 Hyperglycemia, unspecified: Secondary | ICD-10-CM | POA: Diagnosis not present

## 2022-10-23 DIAGNOSIS — E782 Mixed hyperlipidemia: Secondary | ICD-10-CM | POA: Diagnosis not present

## 2022-10-23 DIAGNOSIS — D649 Anemia, unspecified: Secondary | ICD-10-CM

## 2022-10-23 LAB — BASIC METABOLIC PANEL
BUN: 25 mg/dL — ABNORMAL HIGH (ref 6–23)
CO2: 27 mEq/L (ref 19–32)
Calcium: 9.3 mg/dL (ref 8.4–10.5)
Chloride: 102 mEq/L (ref 96–112)
Creatinine, Ser: 1.12 mg/dL (ref 0.40–1.50)
GFR: 60.71 mL/min (ref >60.00)
Glucose, Bld: 105 mg/dL — ABNORMAL HIGH (ref 70–99)
Potassium: 4.6 mEq/L (ref 3.5–5.1)
Sodium: 138 mEq/L (ref 135–145)

## 2022-10-23 LAB — LIPID PANEL
Cholesterol: 139 mg/dL (ref 0–200)
HDL: 65.7 mg/dL (ref >39.00)
LDL Cholesterol: 34 mg/dL (ref 0–99)
NonHDL: 73.65
Total CHOL/HDL Ratio: 2
Triglycerides: 200 mg/dL — ABNORMAL HIGH (ref 0.0–149.0)
VLDL: 40 mg/dL (ref 0.0–40.0)

## 2022-10-23 LAB — CBC WITH DIFFERENTIAL/PLATELET
Basophils Absolute: 0.1 10*3/uL (ref 0.0–0.1)
Basophils Relative: 0.8 % (ref 0.0–3.0)
Eosinophils Absolute: 0.2 10*3/uL (ref 0.0–0.7)
Eosinophils Relative: 2 % (ref 0.0–5.0)
HCT: 40.4 % (ref 39.0–52.0)
Hemoglobin: 13.4 g/dL (ref 13.0–17.0)
Lymphocytes Relative: 14.7 % (ref 12.0–46.0)
Lymphs Abs: 1.8 10*3/uL (ref 0.7–4.0)
MCHC: 33.2 g/dL (ref 30.0–36.0)
MCV: 92.8 fl (ref 78.0–100.0)
Monocytes Absolute: 0.9 10*3/uL (ref 0.1–1.0)
Monocytes Relative: 7.5 % (ref 3.0–12.0)
Neutro Abs: 9.1 10*3/uL — ABNORMAL HIGH (ref 1.4–7.7)
Neutrophils Relative %: 75 % (ref 43.0–77.0)
Platelets: 200 10*3/uL (ref 150.0–400.0)
RBC: 4.36 Mil/uL (ref 4.22–5.81)
RDW: 15.6 % — ABNORMAL HIGH (ref 11.5–15.5)
WBC: 12.1 10*3/uL — ABNORMAL HIGH (ref 4.0–10.5)

## 2022-10-23 LAB — IRON: Iron: 111 ug/dL (ref 42–165)

## 2022-10-23 LAB — HEMOGLOBIN A1C: Hgb A1c MFr Bld: 5.2 % (ref 4.6–6.5)

## 2022-10-23 LAB — FERRITIN: Ferritin: 33 ng/mL (ref 22.0–322.0)

## 2022-10-25 ENCOUNTER — Other Ambulatory Visit: Payer: Self-pay | Admitting: Family Medicine

## 2022-10-25 DIAGNOSIS — D649 Anemia, unspecified: Secondary | ICD-10-CM

## 2022-11-09 ENCOUNTER — Ambulatory Visit: Payer: Medicare Other | Admitting: Urology

## 2022-11-10 ENCOUNTER — Encounter: Payer: Self-pay | Admitting: Oncology

## 2022-11-17 ENCOUNTER — Ambulatory Visit (INDEPENDENT_AMBULATORY_CARE_PROVIDER_SITE_OTHER): Payer: Medicare Other | Admitting: Urology

## 2022-11-17 ENCOUNTER — Encounter: Payer: Self-pay | Admitting: Urology

## 2022-11-17 VITALS — BP 149/67 | HR 74 | Ht 67.0 in | Wt 152.0 lb

## 2022-11-17 DIAGNOSIS — R3914 Feeling of incomplete bladder emptying: Secondary | ICD-10-CM | POA: Diagnosis not present

## 2022-11-17 DIAGNOSIS — N401 Enlarged prostate with lower urinary tract symptoms: Secondary | ICD-10-CM | POA: Diagnosis not present

## 2022-11-17 LAB — BLADDER SCAN AMB NON-IMAGING: Scan Result: 13

## 2022-11-17 MED ORDER — TAMSULOSIN HCL 0.4 MG PO CAPS: 0.4000 mg | ORAL_CAPSULE | Freq: Every day | ORAL | 3 refills | Status: DC

## 2022-11-17 NOTE — Progress Notes (Signed)
11/17/2022 11:20 AM   Ryan Pittman 1940/10/20 [id number]  Referring provider: Tonia Ghent, MD [address] [place],  [place] [address]  Chief Complaint  Patient presents with   Benign Prostatic Hypertrophy     Urologic history: 1.  BPH with LUTS -Bothersome lower urinary tract symptoms 09/2018; PVR 119 mL -Started tamsulosin with improvement  HPI: 82 y.o. male presents for annual follow-up.  Stable LUTS since last years visit Remains on tamsulosin Denies dysuria, gross hematuria Denies flank, abdominal or pelvic pain   PMH: Past Medical History:  Diagnosis Date   Anemia    due to GIB, s/p transfusion   Anginal pain ([place]) 07/10/2018   Arthritis    back pain, much worse after consecutive golf rounds   Cancer [facility])    thyroid   Chronic kidney disease    Colon polyps    Coronary artery disease    COVID-19    Dyspnea    walking up a hill   Dysrhythmia 2020   GERD (gastroesophageal reflux disease)    Hypercalcemia    h/o, resolved as of 2012, prev due to high amount of calcium intake   Hyperlipidemia    Hypertension    Hypothyroidism    IgG gammopathy    stable as of 2012 per [facility]   Pneumonia    as a child   Presence of Watchman left atrial appendage closure device 06/29/2022   Watchman FLX 70mm with Dr. Burt Knack   PVD (peripheral vascular disease) ([place])    L leg bypass, R leg stented    Surgical History: Past Surgical History:  Procedure Laterality Date    dental implant left bottom-did have bleeding      ABDOMINAL AORTOGRAM W/LOWER EXTREMITY Bilateral 06/26/2019   Procedure: ABDOMINAL AORTOGRAM W/LOWER EXTREMITY;  Surgeon: [name], MD;  Location: [facility];  Service: Cardiovascular;  Laterality: Bilateral;   ABDOMINAL AORTOGRAM W/LOWER EXTREMITY N/A 06/08/2020   Procedure: ABDOMINAL AORTOGRAM W/LOWER EXTREMITY;  Surgeon: Serafina Mitchell, MD;  Location: [facility];  Service: Cardiovascular;  Laterality: N/A;    BYPASS GRAFT     L leg   CARDIAC CATHETERIZATION  06/24/2018   COLONOSCOPY N/A 10/09/2021   Procedure: COLONOSCOPY;  Surgeon: Toledo, Benay Pike, MD;  Location: [facility] ENDOSCOPY;  Service: Gastroenterology;  Laterality: N/A;   COLONOSCOPY WITH PROPOFOL N/A 05/04/2015   Procedure: COLONOSCOPY WITH PROPOFOL;  Surgeon: Lollie Sails, MD;  Location: [facility] ENDOSCOPY;  Service: Endoscopy;  Laterality: N/A;   CORONARY ARTERY BYPASS GRAFT N/A 07/11/2018   Procedure: CORONARY ARTERY BYPASS GRAFTING (CABG) x three, using left internal mammary artery and right leg greater saphenous vein harvested endoscopically;  Surgeon: Gaye Pollack, MD;  Location: [facility] OR;  Service: Open Heart Surgery;  Laterality: N/A;   ENDARTERECTOMY FEMORAL Right 02/13/2020   Procedure: RIGHT ENDARTERECTOMY FEMORAL WITH BOVINE PATCH;  Surgeon: Serafina Mitchell, MD;  Location: [facility];  Service: Vascular;  Laterality: Right;   ESOPHAGOGASTRODUODENOSCOPY N/A 10/09/2021   Procedure: ESOPHAGOGASTRODUODENOSCOPY (EGD);  Surgeon: Toledo, Benay Pike, MD;  Location: [facility] ENDOSCOPY;  Service: Gastroenterology;  Laterality: N/A;   GIVENS CAPSULE STUDY N/A 10/09/2021   Procedure: GIVENS CAPSULE STUDY;  Surgeon: Toledo, Benay Pike, MD;  Location: [facility] ENDOSCOPY;  Service: Gastroenterology;  Laterality: N/A;   LEFT ATRIAL APPENDAGE OCCLUSION N/A 06/29/2022   Procedure: LEFT ATRIAL APPENDAGE OCCLUSION;  Surgeon: [name], MD;  Location: [facility];  Service: Cardiovascular;  Laterality: N/A;  LEFT HEART CATH AND CORONARY ANGIOGRAPHY N/A 06/24/2018   Procedure: LEFT HEART CATH AND CORONARY ANGIOGRAPHY;  Surgeon: [name], MD;  Location: [facility];  Service: Cardiovascular;  Laterality: N/A;   LOWER EXTREMITY ANGIOGRAPHY Bilateral 10/25/2021   Procedure: Lower Extremity Angiography;  Surgeon: Serafina Mitchell, MD;  Location: [place] [facility];  Service: Cardiovascular;  Laterality: Bilateral;   PERIPHERAL VASCULAR ATHERECTOMY  Right 06/08/2020   Procedure: PERIPHERAL VASCULAR ATHERECTOMY;  Surgeon: Serafina Mitchell, MD;  Location: [facility];  Service: Cardiovascular;  Laterality: Right;  superficial femoral   PERIPHERAL VASCULAR BALLOON ANGIOPLASTY Right 06/08/2020   Procedure: PERIPHERAL VASCULAR BALLOON ANGIOPLASTY;  Surgeon: Serafina Mitchell, MD;  Location: [facility];  Service: Cardiovascular;  Laterality: Right;  External iliac   PERIPHERAL VASCULAR INTERVENTION Right 10/25/2021   Procedure: PERIPHERAL VASCULAR INTERVENTION;  Surgeon: Serafina Mitchell, MD;  Location: [facility];  Service: Cardiovascular;  Laterality: Right;  SFA   RENAL ANGIOGRAPHY Bilateral 10/25/2021   Procedure: RENAL ANGIOGRAPHY;  Surgeon: Serafina Mitchell, MD;  Location: [facility];  Service: Cardiovascular;  Laterality: Bilateral;   TEE WITHOUT CARDIOVERSION N/A 07/11/2018   Procedure: TRANSESOPHAGEAL ECHOCARDIOGRAM (TEE);  Surgeon: Gaye Pollack, MD;  Location: [facility];  Service: Open Heart Surgery;  Laterality: N/A;   TEE WITHOUT CARDIOVERSION N/A 06/29/2022   Procedure: TRANSESOPHAGEAL ECHOCARDIOGRAM (TEE);  Surgeon: [name], MD;  Location: [facility];  Service: Cardiovascular;  Laterality: N/A;   TEE WITHOUT CARDIOVERSION N/A 08/11/2022   Procedure: TRANSESOPHAGEAL ECHOCARDIOGRAM (TEE);  Surgeon: Sanda Klein, MD;  Location: [facility];  Service: Cardiovascular;  Laterality: N/A;   THYROIDECTOMY, PARTIAL  2016   VASCULAR SURGERY      Home Medications:  Allergies as of 11/17/2022       Reactions   Penicillins Itching, Other (See Comments)   PATIENT HAS HAD A PCN REACTION WITH IMMEDIATE RASH, FACIAL/TONGUE/THROAT SWELLING, SOB, OR LIGHTHEADEDNESS WITH HYPOTENSION:  #  #  YES  #  #  Has patient had a PCN reaction causing severe rash involving mucus membranes or skin necrosis: No Has patient had a PCN reaction that required hospitalization: No Has patient had a PCN reaction occurring  within the last 10 years: No If all of the above answers are "NO", then may proceed with Cephalosporin use.   Ace Inhibitors Cough   Aspirin Other (See Comments)   H/o GI bleed   Celebrex [celecoxib] Other (See Comments)   GI bleed   Lipitor [atorvastatin] Other (See Comments)   myalgias        Medication List        Accurate as of November 17, 2022 11:20 AM. If you have any questions, ask your nurse or doctor.          acetaminophen 500 MG tablet Commonly known as: TYLENOL Take 1,000 mg by mouth every 6 (six) hours as needed for moderate pain or headache.   amLODipine 10 MG tablet Commonly known as: NORVASC TAKE 1 TABLET BY MOUTH DAILY   azithromycin 500 MG tablet Commonly known as: Zithromax Take 2 tablets by mouth one hour prior to dental cleanings/procedures   clopidogrel 75 MG tablet Commonly known as: PLAVIX Take 1 tablet (75 mg total) by mouth daily.   DIALYVITE VITAMIN D 5000 PO Take 5,000 Units by mouth daily.   ferrous sulfate 325 (65 FE) MG EC tablet Take 1 tablet (325 mg total) by mouth daily.  hydrOXYzine 10 MG tablet Commonly known as: ATARAX ONE-HALF TO ONE TABLET 3 TIMES DAILY AS NEEDED FOR ANXIETY   levothyroxine 75 MCG tablet Commonly known as: SYNTHROID TAKE 1 TABLET BY MOUTH DAILY EXCEPT ON THURSDAYS TAKE 1.5 TABLETS   losartan 50 MG tablet Commonly known as: COZAAR Held as of 08/24/22   metoprolol tartrate 25 MG tablet Commonly known as: LOPRESSOR Take 1 tablet (25 mg total) by mouth 2 (two) times daily.   Multivitamin Men 50+ Tabs Take 1 tablet by mouth daily.   pantoprazole 40 MG tablet Commonly known as: PROTONIX Take 1 tablet (40 mg total) by mouth daily.   pramipexole 1 MG tablet Commonly known as: MIRAPEX Take 1 mg by mouth daily as needed (restless leg).   Repatha SureClick [id number] MG/ML Soaj Generic drug: Evolocumab INJECT 140MG  EVERY 14 DAYS   tamsulosin 0.4 MG Caps capsule Commonly known as: FLOMAX Take 1 capsule  (0.4 mg total) by mouth daily.        Allergies:  Allergies  Allergen Reactions   Penicillins Itching and Other (See Comments)    PATIENT HAS HAD A PCN REACTION WITH IMMEDIATE RASH, FACIAL/TONGUE/THROAT SWELLING, SOB, OR LIGHTHEADEDNESS WITH HYPOTENSION:  #  #  YES  #  #  Has patient had a PCN reaction causing severe rash involving mucus membranes or skin necrosis: No Has patient had a PCN reaction that required hospitalization: No Has patient had a PCN reaction occurring within the last 10 years: No If all of the above answers are "NO", then may proceed with Cephalosporin use.    Ace Inhibitors Cough   Aspirin Other (See Comments)    H/o GI bleed   Celebrex [Celecoxib] Other (See Comments)    GI bleed   Lipitor [Atorvastatin] Other (See Comments)    myalgias    Family History: Family History  Problem Relation Age of Onset   Diabetes Mother    Stroke Father    Colon cancer Neg Hx    Prostate cancer Neg Hx     Social History:  reports that he quit smoking about 14 years ago. His smoking use included cigarettes. He has never been exposed to tobacco smoke. He has quit using smokeless tobacco.  His smokeless tobacco use included chew. He reports current alcohol use. He reports that he does not use drugs.   Physical Exam: BP (!) 149/67   Pulse 74   Ht 5\' 7"  (1.702 m)   Wt 152 lb (68.9 kg)   BMI 23.81 kg/m   Constitutional:  Alert and oriented, No acute distress. HEENT: [place] AT Respiratory: Normal respiratory effort, no increased work of breathing. Psychiatric: Normal mood and affect.   Assessment & Plan:    1. Benign prostatic hyperplasia with lower urinary tract symptoms Bladder scan PVR stable at 13 mL Stable LUTS on tamsulosin Refill sent to pharmacy Continue annual follow-up   Ryan Pittman, [place] [address], [place], [place] [address] [phone]

## 2022-11-20 ENCOUNTER — Ambulatory Visit (INDEPENDENT_AMBULATORY_CARE_PROVIDER_SITE_OTHER): Payer: Medicare Other | Admitting: Physician Assistant

## 2022-11-20 ENCOUNTER — Ambulatory Visit (HOSPITAL_COMMUNITY)
Admission: RE | Admit: 2022-11-20 | Discharge: 2022-11-20 | Disposition: A | Discharge status: Home / Self Care | Payer: Medicare Other | Source: Ambulatory Visit | Attending: Surgery | Admitting: Surgery

## 2022-11-20 ENCOUNTER — Ambulatory Visit (INDEPENDENT_AMBULATORY_CARE_PROVIDER_SITE_OTHER)
Admission: RE | Admit: 2022-11-20 | Discharge: 2022-11-20 | Disposition: A | Discharge status: Home / Self Care | Payer: Medicare Other | Source: Ambulatory Visit | Attending: Surgery | Admitting: Surgery

## 2022-11-20 VITALS — BP 155/68 | HR 68 | Temp 97.9°F | Resp 16 | Ht 66.0 in | Wt 154.0 lb

## 2022-11-20 DIAGNOSIS — I739 Peripheral vascular disease, unspecified: Secondary | ICD-10-CM

## 2022-11-20 DIAGNOSIS — I70213 Atherosclerosis of native arteries of extremities with intermittent claudication, bilateral legs: Secondary | ICD-10-CM | POA: Diagnosis not present

## 2022-11-20 LAB — VAS US ABI WITH/WO TBI

## 2022-11-20 NOTE — H&P (View-Only) (Signed)
HISTORY AND PHYSICAL     CC:  follow up. Requesting Provider:  Tonia Ghent, MD  HPI: This is a 82 y.o. male who is here today for follow up for PAD.  Pt has hx of  right iliofemoral endarterectomy with bovine pericardial patch angioplasty on 02/13/20 by Dr. Trula Slade for severe right leg claudication.   in October 2021, he underwent drug coated balloon angioplasty of the right EIA and CFA and atherectomy with drug coated balloon angioplasty of the right SFA by Dr. Trula Slade.  Pt also has hx of left femoral to below knee popliteal bypass with vein by Dr. Lucky Cowboy in [place].  He has known RAS.  He did have selective renal angiography, which did not identify any significant stenosis.    Pt was last seen 08/24/2022 and at that time, he was having some claudication symptoms but his biggest complaint was shortness of breath with minimal activity over about the same time frame as the claudication.  He was also having some discomfort in his chest that he felt was due to his recent Watchman procedure and this discomfort was reproducible.  He was on plavix and asa.  He had been on Eliquis for afib but this was discontinued due to GIB.  He had some decreased velocities in his bypass graft that was concerning for graft failure, however, given his extensive cardiac hx and anemia, he was to continue workup with his PCP and cardiologist and f/u in 3 months for repeat duplex.   The pt returns today for follow up.  He states he is having right calf cramping with walking that interferes with him playing golf and working.  He states it is worse with incline.  He denies any rest pain or non healing wounds.  He has changed cardiologist to Dr. Candis Musa in [place].  The pt is not on a statin for cholesterol management.   intolerance The pt is not on an aspirin.    Other AC:  Plavix The pt is on CCB, ARB, BB for hypertension.  The pt does not have diabetes. Tobacco hx:  former  Pt does not have family hx of  AAA.  Past Medical History:  Diagnosis Date   Anemia    due to GIB, s/p transfusion   Anginal pain ([place]) 07/10/2018   Arthritis    back pain, much worse after consecutive golf rounds   Cancer [facility])    thyroid   Chronic kidney disease    Colon polyps    Coronary artery disease    COVID-19    Dyspnea    walking up a hill   Dysrhythmia 2020   GERD (gastroesophageal reflux disease)    Hypercalcemia    h/o, resolved as of 2012, prev due to high amount of calcium intake   Hyperlipidemia    Hypertension    Hypothyroidism    IgG gammopathy    stable as of 2012 per [facility]   Pneumonia    as a child   Presence of Watchman left atrial appendage closure device 06/29/2022   Watchman FLX 5mm with Dr. Burt Knack   PVD (peripheral vascular disease) ([place])    L leg bypass, R leg stented    Past Surgical History:  Procedure Laterality Date    dental implant left bottom-did have bleeding      ABDOMINAL AORTOGRAM W/LOWER EXTREMITY Bilateral 06/26/2019   Procedure: ABDOMINAL AORTOGRAM W/LOWER EXTREMITY;  Surgeon: [name], MD;  Location: [place] [facility];  Service: Cardiovascular;  Laterality:  Bilateral;   ABDOMINAL AORTOGRAM W/LOWER EXTREMITY N/A 06/08/2020   Procedure: ABDOMINAL AORTOGRAM W/LOWER EXTREMITY;  Surgeon: Serafina Mitchell, MD;  Location: [place] [facility];  Service: Cardiovascular;  Laterality: N/A;   BYPASS GRAFT     L leg   CARDIAC CATHETERIZATION  06/24/2018   COLONOSCOPY N/A 10/09/2021   Procedure: COLONOSCOPY;  Surgeon: Toledo, Benay Pike, MD;  Location: [facility] ENDOSCOPY;  Service: Gastroenterology;  Laterality: N/A;   COLONOSCOPY WITH PROPOFOL N/A 05/04/2015   Procedure: COLONOSCOPY WITH PROPOFOL;  Surgeon: Lollie Sails, MD;  Location: [facility] ENDOSCOPY;  Service: Endoscopy;  Laterality: N/A;   CORONARY ARTERY BYPASS GRAFT N/A 07/11/2018   Procedure: CORONARY ARTERY BYPASS GRAFTING (CABG) x three, using left internal mammary artery and right leg greater saphenous vein  harvested endoscopically;  Surgeon: Gaye Pollack, MD;  Location: [facility];  Service: Open Heart Surgery;  Laterality: N/A;   ENDARTERECTOMY FEMORAL Right 02/13/2020   Procedure: RIGHT ENDARTERECTOMY FEMORAL WITH BOVINE PATCH;  Surgeon: Serafina Mitchell, MD;  Location: [facility];  Service: Vascular;  Laterality: Right;   ESOPHAGOGASTRODUODENOSCOPY N/A 10/09/2021   Procedure: ESOPHAGOGASTRODUODENOSCOPY (EGD);  Surgeon: Toledo, Benay Pike, MD;  Location: [facility] ENDOSCOPY;  Service: Gastroenterology;  Laterality: N/A;   GIVENS CAPSULE STUDY N/A 10/09/2021   Procedure: GIVENS CAPSULE STUDY;  Surgeon: Toledo, Benay Pike, MD;  Location: [facility] ENDOSCOPY;  Service: Gastroenterology;  Laterality: N/A;   LEFT ATRIAL APPENDAGE OCCLUSION N/A 06/29/2022   Procedure: LEFT ATRIAL APPENDAGE OCCLUSION;  Surgeon: [name], MD;  Location: [place] [facility];  Service: Cardiovascular;  Laterality: N/A;   LEFT HEART CATH AND CORONARY ANGIOGRAPHY N/A 06/24/2018   Procedure: LEFT HEART CATH AND CORONARY ANGIOGRAPHY;  Surgeon: [name], MD;  Location: [place] [facility];  Service: Cardiovascular;  Laterality: N/A;   LOWER EXTREMITY ANGIOGRAPHY Bilateral 10/25/2021   Procedure: Lower Extremity Angiography;  Surgeon: Serafina Mitchell, MD;  Location: [place] [facility];  Service: Cardiovascular;  Laterality: Bilateral;   PERIPHERAL VASCULAR ATHERECTOMY Right 06/08/2020   Procedure: PERIPHERAL VASCULAR ATHERECTOMY;  Surgeon: Serafina Mitchell, MD;  Location: [place] [facility];  Service: Cardiovascular;  Laterality: Right;  superficial femoral   PERIPHERAL VASCULAR BALLOON ANGIOPLASTY Right 06/08/2020   Procedure: PERIPHERAL VASCULAR BALLOON ANGIOPLASTY;  Surgeon: Serafina Mitchell, MD;  Location: [place] [facility];  Service: Cardiovascular;  Laterality: Right;  External iliac   PERIPHERAL VASCULAR INTERVENTION Right 10/25/2021   Procedure: PERIPHERAL VASCULAR INTERVENTION;  Surgeon: Serafina Mitchell, MD;  Location: [place]  [facility];  Service: Cardiovascular;  Laterality: Right;  SFA   RENAL ANGIOGRAPHY Bilateral 10/25/2021   Procedure: RENAL ANGIOGRAPHY;  Surgeon: Serafina Mitchell, MD;  Location: [place] [facility];  Service: Cardiovascular;  Laterality: Bilateral;   TEE WITHOUT CARDIOVERSION N/A 07/11/2018   Procedure: TRANSESOPHAGEAL ECHOCARDIOGRAM (TEE);  Surgeon: Gaye Pollack, MD;  Location: [place];  Service: Open Heart Surgery;  Laterality: N/A;   TEE WITHOUT CARDIOVERSION N/A 06/29/2022   Procedure: TRANSESOPHAGEAL ECHOCARDIOGRAM (TEE);  Surgeon: [name], MD;  Location: [place] [facility];  Service: Cardiovascular;  Laterality: N/A;   TEE WITHOUT CARDIOVERSION N/A 08/11/2022   Procedure: TRANSESOPHAGEAL ECHOCARDIOGRAM (TEE);  Surgeon: Sanda Klein, MD;  Location: [place];  Service: Cardiovascular;  Laterality: N/A;   THYROIDECTOMY, PARTIAL  2016   VASCULAR SURGERY      Allergies  Allergen Reactions   Penicillins Itching and Other (See Comments)    PATIENT HAS HAD A PCN REACTION WITH IMMEDIATE  RASH, FACIAL/TONGUE/THROAT SWELLING, SOB, OR LIGHTHEADEDNESS WITH HYPOTENSION:  #  #  YES  #  #  Has patient had a PCN reaction causing severe rash involving mucus membranes or skin necrosis: No Has patient had a PCN reaction that required hospitalization: No Has patient had a PCN reaction occurring within the last 10 years: No If all of the above answers are "NO", then may proceed with Cephalosporin use.    Ace Inhibitors Cough   Aspirin Other (See Comments)    H/o GI bleed   Celebrex [Celecoxib] Other (See Comments)    GI bleed   Lipitor [Atorvastatin] Other (See Comments)    myalgias    Current Outpatient Medications  Medication Sig Dispense Refill   acetaminophen (TYLENOL) 500 MG tablet Take 1,000 mg by mouth every 6 (six) hours as needed for moderate pain or headache.     amLODipine (NORVASC) 10 MG tablet TAKE 1 TABLET BY MOUTH DAILY 90 tablet 3   azithromycin (ZITHROMAX) 500 MG tablet  Take 2 tablets by mouth one hour prior to dental cleanings/procedures 2 tablet 3   Cholecalciferol (DIALYVITE VITAMIN D 5000 PO) Take 5,000 Units by mouth daily.     clopidogrel (PLAVIX) 75 MG tablet Take 1 tablet (75 mg total) by mouth daily. 90 tablet 3   ferrous sulfate 325 (65 FE) MG EC tablet Take 1 tablet (325 mg total) by mouth daily. 90 tablet 1   hydrOXYzine (ATARAX) 10 MG tablet ONE-HALF TO ONE TABLET 3 TIMES DAILY AS NEEDED FOR ANXIETY 30 tablet 2   levothyroxine (SYNTHROID) 75 MCG tablet TAKE 1 TABLET BY MOUTH DAILY EXCEPT ON THURSDAYS TAKE 1.5 TABLETS 100 tablet 3   losartan (COZAAR) 50 MG tablet Held as of 08/24/22     metoprolol tartrate (LOPRESSOR) 25 MG tablet Take 1 tablet (25 mg total) by mouth 2 (two) times daily. 180 tablet 3   Multiple Vitamins-Minerals (MULTIVITAMIN MEN 50+) TABS Take 1 tablet by mouth daily.     pantoprazole (PROTONIX) 40 MG tablet Take 1 tablet (40 mg total) by mouth daily. 90 tablet 3   pramipexole (MIRAPEX) 1 MG tablet Take 1 mg by mouth daily as needed (restless leg).     REPATHA SURECLICK [id number] MG/ML SOAJ INJECT 140MG  EVERY 14 DAYS 2 mL 11   tamsulosin (FLOMAX) 0.4 MG CAPS capsule Take 1 capsule (0.4 mg total) by mouth daily. 90 capsule 3   No current facility-administered medications for this visit.    Family History  Problem Relation Age of Onset   Diabetes Mother    Stroke Father    Colon cancer Neg Hx    Prostate cancer Neg Hx     Social History   Socioeconomic History   Marital status: Married    Spouse name: Not on file   Number of children: Not on file   Years of education: Not on file   Highest education level: Not on file  Occupational History   Not on file  Tobacco Use   Smoking status: Former    Types: Cigarettes    Quit date: 09/04/2008    Years since quitting: 14.2    Passive exposure: Never   Smokeless tobacco: Former    Types: Chew   Tobacco comments:    HAs quit tobacco products 2009  Vaping Use   Vaping Use:  Never used  Substance and Sexual Activity   Alcohol use: Yes    Comment: occ   Drug use: No   Sexual activity: Yes  Other Topics Concern   Not on file  Social History Narrative   Married 50+ years, 2 kids   [place] to play golf   Social Determinants of Health   Financial Resource Strain: Low Risk  (12/22/2021)   Overall Financial Resource Strain (CARDIA)    Difficulty of Paying Living Expenses: Not hard at all  Food Insecurity: No Food Insecurity (05/17/2022)   Hunger Vital Sign    Worried About Running Out of Food in the Last Year: Never true    Ran Out of Food in the Last Year: Never true  Transportation Needs: No Transportation Needs (07/05/2022)   PRAPARE - Hydrologist (Medical): No    Lack of Transportation (Non-Medical): No  Physical Activity: Insufficiently Active (12/22/2021)   Exercise Vital Sign    Days of Exercise per Week: 3 days    Minutes of Exercise per Session: 30 min  Stress: No Stress Concern Present (12/22/2021)   [place]    Feeling of Stress : Not at all  Social Connections: Moderately Integrated (12/22/2021)   Social Connection and Isolation Panel [NHANES]    Frequency of Communication with Friends and Family: More than three times a week    Frequency of Social Gatherings with Friends and Family: More than three times a week    Attends Religious Services: More than 4 times per year    Active Member of [employer] or Organizations: No    Attends Archivist Meetings: Never    Marital Status: Married  Human resources officer Violence: Not At Risk (12/22/2021)   Humiliation, Afraid, Rape, and Kick questionnaire    Fear of Current or Ex-Partner: No    Emotionally Abused: No    Physically Abused: No    Sexually Abused: No     REVIEW OF SYSTEMS:   [X]  denotes positive finding, [ ]  denotes negative finding Cardiac  Comments:  Chest pain or  chest pressure:    Shortness of breath upon exertion:    Short of breath when lying flat:    Irregular heart rhythm:        Vascular    Pain in calf, thigh, or hip brought on by ambulation: x   Pain in feet at night that wakes you up from your sleep:     Blood clot in your veins:    Leg swelling:         Pulmonary    Oxygen at home:    Productive cough:     Wheezing:         Neurologic    Sudden weakness in arms or legs:     Sudden numbness in arms or legs:     Sudden onset of difficulty speaking or slurred speech:    Temporary loss of vision in one eye:     Problems with dizziness:         Gastrointestinal    Blood in stool:     Vomited blood:         Genitourinary    Burning when urinating:     Blood in urine:        Psychiatric    Major depression:         Hematologic    Bleeding problems:    Problems with blood clotting too easily:        Skin    Rashes or ulcers:  Constitutional    Fever or chills:      PHYSICAL EXAMINATION:  Today's Vitals   11/20/22 1425  BP: (!) 155/68  Pulse: 68  Resp: 16  Temp: 97.9 F (36.6 C)  TempSrc: Temporal  SpO2: 97%  Weight: 154 lb (69.9 kg)  Height: 5\' 6"  (1.676 m)  PainSc: 10-Worst pain ever   Body mass index is 24.86 kg/m.   General:  WDWN in NAD; vital signs documented above Gait: Not observed HENT: WNL, normocephalic Pulmonary: normal non-labored breathing , without wheezing Cardiac: regular HR, without carotid bruits Abdomen: soft, NT; aortic pulse is not palpable Skin: without rashes Vascular Exam/Pulses:  Right Left  Radial 2+ (normal) 2+ (normal)  Femoral 2+ (normal) 2+ (normal)  DP monophasic biphasic  PT monophasic biphasic  Peroneal monophasic biphasic   Extremities: without ischemic changes, without Gangrene , without cellulitis; without open wounds Musculoskeletal: no muscle wasting or atrophy  Neurologic: A&O X 3 Psychiatric:  The pt has Normal affect.   Non-Invasive Vascular  Imaging:   ABI's/TBI's on 11/20/2022: Right:  [place]/0.34 - Great toe pressure: 55 Left:  [place]/0.44 - Great toe pressure: 71  Arterial duplex on 11/20/2022: +----------+--------+-----+---------------+----------+--------+  RIGHT    PSV cm/sRatioStenosis       Waveform  Comments  +----------+--------+-----+---------------+----------+--------+  CFA Distal130                                             +----------+--------+-----+---------------+----------+--------+  POP Prox  57                          monophasic          +----------+--------+-----+---------------+----------+--------+  POP Mid   587          75-99% stenosismonophasic          +----------+--------+-----+---------------+----------+--------+  POP Distal140                         monophasic          +----------+--------+-----+---------------+----------+--------+   Right Stent(s):SFA  +---------------+---++----------++  Prox to Stent  130biphasic    +---------------+---++----------++  Proximal Stent 62 monophasic  +---------------+---++----------++  Mid Stent      59 monophasic  +---------------+---++----------++  Distal Stent   54 monophasic  +---------------+---++----------++  Distal to Stent57 monophasic  +---------------+---++----------++   +----------+--------+-----+---------------+----------+--------+  LEFT     PSV cm/sRatioStenosis       Waveform  Comments  +----------+--------+-----+---------------+----------+--------+  CFA Distal251          50-74% stenosismonophasic          +----------+--------+-----+---------------+----------+--------+     Left Graft #1: femoral to popliteal  +--------------------+--------+---------------+----------+--------+                     PSV cm/sStenosis       Waveform  Comments  +--------------------+--------+---------------+----------+--------+  Inflow             207     50-74%  stenosismonophasic          +--------------------+--------+---------------+----------+--------+  Proximal Anastomosis96                     monophasic          +--------------------+--------+---------------+----------+--------+  Proximal Graft      64  monophasic          +--------------------+--------+---------------+----------+--------+  Mid Graft           65                     monophasic          +--------------------+--------+---------------+----------+--------+  Distal Graft        59                     monophasic          +--------------------+--------+---------------+----------+--------+  Distal Anastomosis  62                     monophasic          +--------------------+--------+---------------+----------+--------+  Outflow            71                     monophasic          +--------------------+--------+---------------+----------+--------+   Summary:  Right: Patent SFA stent.  75-99% stenosis noted in the mid popliteal artery.   Left: 50-74% stenosis noted in the common femoral artery. Widely patent femoropopliteal bypass graft.    Previous ABI's/TBI's on 08/24/2022: Right:  1.08/0.45 (M) - Great toe pressure: 67 Left:  1.03/0.44 (Multiphasic) - Great toe pressure:  66  Previous arterial duplex on 08/23/2022: Summary:  Right: 30-49% stenosis noted in the common femoral artery. 30-49% stenosis noted in the deep femoral artery. Patent stent with no evidence of stenosis in the Right superficial femoral artery.   Left: 50-74% stenosis noted in the common femoral artery. 50-74% stenosis noted in the deep femoral artery. Patent Left femoral popliteal graft. Graft PSV less than 40cm/s may suggest the presence of a threatened graft.     ASSESSMENT/PLAN:: 82 y.o. male here for follow up for PAD with hx of right iliofemoral endarterectomy with bovine pericardial patch angioplasty on 02/13/20 by Dr. Trula Slade for severe  right leg claudication.   in October 2021, he underwent drug coated balloon angioplasty of the right EIA and CFA and atherectomy with drug coated balloon angioplasty of the right SFA by Dr. Trula Slade.  Pt also has hx of left femoral to below knee popliteal bypass with vein by Dr. Lucky Cowboy in [place].  He has known RAS.  He did have selective renal angiography, which did not identify any significant stenosis.     -pt having lifestyle limiting claudication in the right leg.  Duplex today reveals velocity of 587 cm/s in the mid popliteal artery and his TBI and toe pressures are also decreased.  Given this, will set him up for aortogram with RLE runoff to evaluate and possible intervention with Dr. Trula Slade.   -continue Plavix.  He is not on asa or Eliquis due to hx of GIB.  He is not on statin due to intolerance.  -the velocities in the Left leg bypass are improved from duplex 3 months ago.     Leontine Locket, [facility] [phone]  Clinic MD:   Trula Slade

## 2022-11-20 NOTE — Progress Notes (Signed)
HISTORY AND PHYSICAL     CC:  follow up. Requesting Provider:  Tonia Ghent, MD  HPI: This is a 82 y.o. male who is here today for follow up for PAD.  Pt has hx of  right iliofemoral endarterectomy with bovine pericardial patch angioplasty on 02/13/20 by Dr. Trula Slade for severe right leg claudication.   in October 2021, he underwent drug coated balloon angioplasty of the right EIA and CFA and atherectomy with drug coated balloon angioplasty of the right SFA by Dr. Trula Slade.  Pt also has hx of left femoral to below knee popliteal bypass with vein by Dr. Lucky Cowboy in [place].  He has known RAS.  He did have selective renal angiography, which did not identify any significant stenosis.    Pt was last seen 08/24/2022 and at that time, he was having some claudication symptoms but his biggest complaint was shortness of breath with minimal activity over about the same time frame as the claudication.  He was also having some discomfort in his chest that he felt was due to his recent Watchman procedure and this discomfort was reproducible.  He was on plavix and asa.  He had been on Eliquis for afib but this was discontinued due to GIB.  He had some decreased velocities in his bypass graft that was concerning for graft failure, however, given his extensive cardiac hx and anemia, he was to continue workup with his PCP and cardiologist and f/u in 3 months for repeat duplex.   The pt returns today for follow up.  He states he is having right calf cramping with walking that interferes with him playing golf and working.  He states it is worse with incline.  He denies any rest pain or non healing wounds.  He has changed cardiologist to Dr. Candis Musa in [place].  The pt is not on a statin for cholesterol management.   intolerance The pt is not on an aspirin.    Other AC:  Plavix The pt is on CCB, ARB, BB for hypertension.  The pt does not have diabetes. Tobacco hx:  former  Pt does not have family hx of  AAA.  Past Medical History:  Diagnosis Date   Anemia    due to GIB, s/p transfusion   Anginal pain ([place]) 07/10/2018   Arthritis    back pain, much worse after consecutive golf rounds   Cancer [facility])    thyroid   Chronic kidney disease    Colon polyps    Coronary artery disease    COVID-19    Dyspnea    walking up a hill   Dysrhythmia 2020   GERD (gastroesophageal reflux disease)    Hypercalcemia    h/o, resolved as of 2012, prev due to high amount of calcium intake   Hyperlipidemia    Hypertension    Hypothyroidism    IgG gammopathy    stable as of 2012 per [facility]   Pneumonia    as a child   Presence of Watchman left atrial appendage closure device 06/29/2022   Watchman FLX 5mm with Dr. Burt Knack   PVD (peripheral vascular disease) ([place])    L leg bypass, R leg stented    Past Surgical History:  Procedure Laterality Date    dental implant left bottom-did have bleeding      ABDOMINAL AORTOGRAM W/LOWER EXTREMITY Bilateral 06/26/2019   Procedure: ABDOMINAL AORTOGRAM W/LOWER EXTREMITY;  Surgeon: [name], MD;  Location: [place] [facility];  Service: Cardiovascular;  Laterality:  Bilateral;   ABDOMINAL AORTOGRAM W/LOWER EXTREMITY N/A 06/08/2020   Procedure: ABDOMINAL AORTOGRAM W/LOWER EXTREMITY;  Surgeon: Serafina Mitchell, MD;  Location: [place] [facility];  Service: Cardiovascular;  Laterality: N/A;   BYPASS GRAFT     L leg   CARDIAC CATHETERIZATION  06/24/2018   COLONOSCOPY N/A 10/09/2021   Procedure: COLONOSCOPY;  Surgeon: Toledo, Benay Pike, MD;  Location: [facility] ENDOSCOPY;  Service: Gastroenterology;  Laterality: N/A;   COLONOSCOPY WITH PROPOFOL N/A 05/04/2015   Procedure: COLONOSCOPY WITH PROPOFOL;  Surgeon: Lollie Sails, MD;  Location: [facility] ENDOSCOPY;  Service: Endoscopy;  Laterality: N/A;   CORONARY ARTERY BYPASS GRAFT N/A 07/11/2018   Procedure: CORONARY ARTERY BYPASS GRAFTING (CABG) x three, using left internal mammary artery and right leg greater saphenous vein  harvested endoscopically;  Surgeon: Gaye Pollack, MD;  Location: [place] OR;  Service: Open Heart Surgery;  Laterality: N/A;   ENDARTERECTOMY FEMORAL Right 02/13/2020   Procedure: RIGHT ENDARTERECTOMY FEMORAL WITH BOVINE PATCH;  Surgeon: Serafina Mitchell, MD;  Location: [facility] OR;  Service: Vascular;  Laterality: Right;   ESOPHAGOGASTRODUODENOSCOPY N/A 10/09/2021   Procedure: ESOPHAGOGASTRODUODENOSCOPY (EGD);  Surgeon: Toledo, Benay Pike, MD;  Location: [facility] ENDOSCOPY;  Service: Gastroenterology;  Laterality: N/A;   GIVENS CAPSULE STUDY N/A 10/09/2021   Procedure: GIVENS CAPSULE STUDY;  Surgeon: Toledo, Benay Pike, MD;  Location: [facility] ENDOSCOPY;  Service: Gastroenterology;  Laterality: N/A;   LEFT ATRIAL APPENDAGE OCCLUSION N/A 06/29/2022   Procedure: LEFT ATRIAL APPENDAGE OCCLUSION;  Surgeon: [name], MD;  Location: [place] [facility];  Service: Cardiovascular;  Laterality: N/A;   LEFT HEART CATH AND CORONARY ANGIOGRAPHY N/A 06/24/2018   Procedure: LEFT HEART CATH AND CORONARY ANGIOGRAPHY;  Surgeon: [name], MD;  Location: [place] [facility];  Service: Cardiovascular;  Laterality: N/A;   LOWER EXTREMITY ANGIOGRAPHY Bilateral 10/25/2021   Procedure: Lower Extremity Angiography;  Surgeon: Serafina Mitchell, MD;  Location: [place] [facility];  Service: Cardiovascular;  Laterality: Bilateral;   PERIPHERAL VASCULAR ATHERECTOMY Right 06/08/2020   Procedure: PERIPHERAL VASCULAR ATHERECTOMY;  Surgeon: Serafina Mitchell, MD;  Location: [place] [facility];  Service: Cardiovascular;  Laterality: Right;  superficial femoral   PERIPHERAL VASCULAR BALLOON ANGIOPLASTY Right 06/08/2020   Procedure: PERIPHERAL VASCULAR BALLOON ANGIOPLASTY;  Surgeon: Serafina Mitchell, MD;  Location: [place] [facility];  Service: Cardiovascular;  Laterality: Right;  External iliac   PERIPHERAL VASCULAR INTERVENTION Right 10/25/2021   Procedure: PERIPHERAL VASCULAR INTERVENTION;  Surgeon: Serafina Mitchell, MD;  Location: [place]  [facility];  Service: Cardiovascular;  Laterality: Right;  SFA   RENAL ANGIOGRAPHY Bilateral 10/25/2021   Procedure: RENAL ANGIOGRAPHY;  Surgeon: Serafina Mitchell, MD;  Location: [place] [facility];  Service: Cardiovascular;  Laterality: Bilateral;   TEE WITHOUT CARDIOVERSION N/A 07/11/2018   Procedure: TRANSESOPHAGEAL ECHOCARDIOGRAM (TEE);  Surgeon: Gaye Pollack, MD;  Location: [place];  Service: Open Heart Surgery;  Laterality: N/A;   TEE WITHOUT CARDIOVERSION N/A 06/29/2022   Procedure: TRANSESOPHAGEAL ECHOCARDIOGRAM (TEE);  Surgeon: [name], MD;  Location: [place] [facility];  Service: Cardiovascular;  Laterality: N/A;   TEE WITHOUT CARDIOVERSION N/A 08/11/2022   Procedure: TRANSESOPHAGEAL ECHOCARDIOGRAM (TEE);  Surgeon: Sanda Klein, MD;  Location: [place];  Service: Cardiovascular;  Laterality: N/A;   THYROIDECTOMY, PARTIAL  2016   VASCULAR SURGERY      Allergies  Allergen Reactions   Penicillins Itching and Other (See Comments)    PATIENT HAS HAD A PCN REACTION WITH IMMEDIATE  RASH, FACIAL/TONGUE/THROAT SWELLING, SOB, OR LIGHTHEADEDNESS WITH HYPOTENSION:  #  #  YES  #  #  Has patient had a PCN reaction causing severe rash involving mucus membranes or skin necrosis: No Has patient had a PCN reaction that required hospitalization: No Has patient had a PCN reaction occurring within the last 10 years: No If all of the above answers are "NO", then may proceed with Cephalosporin use.    Ace Inhibitors Cough   Aspirin Other (See Comments)    H/o GI bleed   Celebrex [Celecoxib] Other (See Comments)    GI bleed   Lipitor [Atorvastatin] Other (See Comments)    myalgias    Current Outpatient Medications  Medication Sig Dispense Refill   acetaminophen (TYLENOL) 500 MG tablet Take 1,000 mg by mouth every 6 (six) hours as needed for moderate pain or headache.     amLODipine (NORVASC) 10 MG tablet TAKE 1 TABLET BY MOUTH DAILY 90 tablet 3   azithromycin (ZITHROMAX) 500 MG tablet  Take 2 tablets by mouth one hour prior to dental cleanings/procedures 2 tablet 3   Cholecalciferol (DIALYVITE VITAMIN D 5000 PO) Take 5,000 Units by mouth daily.     clopidogrel (PLAVIX) 75 MG tablet Take 1 tablet (75 mg total) by mouth daily. 90 tablet 3   ferrous sulfate 325 (65 FE) MG EC tablet Take 1 tablet (325 mg total) by mouth daily. 90 tablet 1   hydrOXYzine (ATARAX) 10 MG tablet ONE-HALF TO ONE TABLET 3 TIMES DAILY AS NEEDED FOR ANXIETY 30 tablet 2   levothyroxine (SYNTHROID) 75 MCG tablet TAKE 1 TABLET BY MOUTH DAILY EXCEPT ON THURSDAYS TAKE 1.5 TABLETS 100 tablet 3   losartan (COZAAR) 50 MG tablet Held as of 08/24/22     metoprolol tartrate (LOPRESSOR) 25 MG tablet Take 1 tablet (25 mg total) by mouth 2 (two) times daily. 180 tablet 3   Multiple Vitamins-Minerals (MULTIVITAMIN MEN 50+) TABS Take 1 tablet by mouth daily.     pantoprazole (PROTONIX) 40 MG tablet Take 1 tablet (40 mg total) by mouth daily. 90 tablet 3   pramipexole (MIRAPEX) 1 MG tablet Take 1 mg by mouth daily as needed (restless leg).     REPATHA SURECLICK [id number] MG/ML SOAJ INJECT 140MG  EVERY 14 DAYS 2 mL 11   tamsulosin (FLOMAX) 0.4 MG CAPS capsule Take 1 capsule (0.4 mg total) by mouth daily. 90 capsule 3   No current facility-administered medications for this visit.    Family History  Problem Relation Age of Onset   Diabetes Mother    Stroke Father    Colon cancer Neg Hx    Prostate cancer Neg Hx     Social History   Socioeconomic History   Marital status: Married    Spouse name: Not on file   Number of children: Not on file   Years of education: Not on file   Highest education level: Not on file  Occupational History   Not on file  Tobacco Use   Smoking status: Former    Types: Cigarettes    Quit date: 09/04/2008    Years since quitting: 14.2    Passive exposure: Never   Smokeless tobacco: Former    Types: Chew   Tobacco comments:    HAs quit tobacco products 2009  Vaping Use   Vaping Use:  Never used  Substance and Sexual Activity   Alcohol use: Yes    Comment: occ   Drug use: No   Sexual activity: Yes  Other Topics Concern   Not on file  Social History Narrative   Married 50+ years, 2 kids   [employer] to play golf   Social Determinants of Health   Financial Resource Strain: Low Risk  (12/22/2021)   Overall Financial Resource Strain (CARDIA)    Difficulty of Paying Living Expenses: Not hard at all  Food Insecurity: No Food Insecurity (05/17/2022)   Hunger Vital Sign    Worried About Running Out of Food in the Last Year: Never true    Ran Out of Food in the Last Year: Never true  Transportation Needs: No Transportation Needs (07/05/2022)   PRAPARE - Hydrologist (Medical): No    Lack of Transportation (Non-Medical): No  Physical Activity: Insufficiently Active (12/22/2021)   Exercise Vital Sign    Days of Exercise per Week: 3 days    Minutes of Exercise per Session: 30 min  Stress: No Stress Concern Present (12/22/2021)   [place]    Feeling of Stress : Not at all  Social Connections: Moderately Integrated (12/22/2021)   Social Connection and Isolation Panel [NHANES]    Frequency of Communication with Friends and Family: More than three times a week    Frequency of Social Gatherings with Friends and Family: More than three times a week    Attends Religious Services: More than 4 times per year    Active Member of [employer] or Organizations: No    Attends Archivist Meetings: Never    Marital Status: Married  Human resources officer Violence: Not At Risk (12/22/2021)   Humiliation, Afraid, Rape, and Kick questionnaire    Fear of Current or Ex-Partner: No    Emotionally Abused: No    Physically Abused: No    Sexually Abused: No     REVIEW OF SYSTEMS:   [X]  denotes positive finding, [ ]  denotes negative finding Cardiac  Comments:  Chest pain or  chest pressure:    Shortness of breath upon exertion:    Short of breath when lying flat:    Irregular heart rhythm:        Vascular    Pain in calf, thigh, or hip brought on by ambulation: x   Pain in feet at night that wakes you up from your sleep:     Blood clot in your veins:    Leg swelling:         Pulmonary    Oxygen at home:    Productive cough:     Wheezing:         Neurologic    Sudden weakness in arms or legs:     Sudden numbness in arms or legs:     Sudden onset of difficulty speaking or slurred speech:    Temporary loss of vision in one eye:     Problems with dizziness:         Gastrointestinal    Blood in stool:     Vomited blood:         Genitourinary    Burning when urinating:     Blood in urine:        Psychiatric    Major depression:         Hematologic    Bleeding problems:    Problems with blood clotting too easily:        Skin    Rashes or ulcers:  Constitutional    Fever or chills:      PHYSICAL EXAMINATION:  Today's Vitals   11/20/22 1425  BP: (!) 155/68  Pulse: 68  Resp: 16  Temp: 97.9 F (36.6 C)  TempSrc: Temporal  SpO2: 97%  Weight: 154 lb (69.9 kg)  Height: 5\' 6"  (1.676 m)  PainSc: 10-Worst pain ever   Body mass index is 24.86 kg/m.   General:  WDWN in NAD; vital signs documented above Gait: Not observed HENT: WNL, normocephalic Pulmonary: normal non-labored breathing , without wheezing Cardiac: regular HR, without carotid bruits Abdomen: soft, NT; aortic pulse is not palpable Skin: without rashes Vascular Exam/Pulses:  Right Left  Radial 2+ (normal) 2+ (normal)  Femoral 2+ (normal) 2+ (normal)  DP monophasic biphasic  PT monophasic biphasic  Peroneal monophasic biphasic   Extremities: without ischemic changes, without Gangrene , without cellulitis; without open wounds Musculoskeletal: no muscle wasting or atrophy  Neurologic: A&O X 3 Psychiatric:  The pt has Normal affect.   Non-Invasive Vascular  Imaging:   ABI's/TBI's on 11/20/2022: Right:  [place]/0.34 - Great toe pressure: 55 Left:  [place]/0.44 - Great toe pressure: 71  Arterial duplex on 11/20/2022: +----------+--------+-----+---------------+----------+--------+  RIGHT    PSV cm/sRatioStenosis       Waveform  Comments  +----------+--------+-----+---------------+----------+--------+  CFA Distal130                                             +----------+--------+-----+---------------+----------+--------+  POP Prox  57                          monophasic          +----------+--------+-----+---------------+----------+--------+  POP Mid   587          75-99% stenosismonophasic          +----------+--------+-----+---------------+----------+--------+  POP Distal140                         monophasic          +----------+--------+-----+---------------+----------+--------+   Right Stent(s):SFA  +---------------+---++----------++  Prox to Stent  130biphasic    +---------------+---++----------++  Proximal Stent 62 monophasic  +---------------+---++----------++  Mid Stent      59 monophasic  +---------------+---++----------++  Distal Stent   54 monophasic  +---------------+---++----------++  Distal to Stent57 monophasic  +---------------+---++----------++   +----------+--------+-----+---------------+----------+--------+  LEFT     PSV cm/sRatioStenosis       Waveform  Comments  +----------+--------+-----+---------------+----------+--------+  CFA Distal251          50-74% stenosismonophasic          +----------+--------+-----+---------------+----------+--------+     Left Graft #1: femoral to popliteal  +--------------------+--------+---------------+----------+--------+                     PSV cm/sStenosis       Waveform  Comments  +--------------------+--------+---------------+----------+--------+  Inflow             207     50-74%  stenosismonophasic          +--------------------+--------+---------------+----------+--------+  Proximal Anastomosis96                     monophasic          +--------------------+--------+---------------+----------+--------+  Proximal Graft      64  monophasic          +--------------------+--------+---------------+----------+--------+  Mid Graft           65                     monophasic          +--------------------+--------+---------------+----------+--------+  Distal Graft        59                     monophasic          +--------------------+--------+---------------+----------+--------+  Distal Anastomosis  62                     monophasic          +--------------------+--------+---------------+----------+--------+  Outflow            71                     monophasic          +--------------------+--------+---------------+----------+--------+   Summary:  Right: Patent SFA stent.  75-99% stenosis noted in the mid popliteal artery.   Left: 50-74% stenosis noted in the common femoral artery. Widely patent femoropopliteal bypass graft.    Previous ABI's/TBI's on 08/24/2022: Right:  1.08/0.45 (M) - Great toe pressure: 67 Left:  1.03/0.44 (Multiphasic) - Great toe pressure:  66  Previous arterial duplex on 08/23/2022: Summary:  Right: 30-49% stenosis noted in the common femoral artery. 30-49% stenosis noted in the deep femoral artery. Patent stent with no evidence of stenosis in the Right superficial femoral artery.   Left: 50-74% stenosis noted in the common femoral artery. 50-74% stenosis noted in the deep femoral artery. Patent Left femoral popliteal graft. Graft PSV less than 40cm/s may suggest the presence of a threatened graft.     ASSESSMENT/PLAN:: 82 y.o. male here for follow up for PAD with hx of right iliofemoral endarterectomy with bovine pericardial patch angioplasty on 02/13/20 by Dr. Trula Slade for severe  right leg claudication.   in October 2021, he underwent drug coated balloon angioplasty of the right EIA and CFA and atherectomy with drug coated balloon angioplasty of the right SFA by Dr. Trula Slade.  Pt also has hx of left femoral to below knee popliteal bypass with vein by Dr. Lucky Cowboy in [place].  He has known RAS.  He did have selective renal angiography, which did not identify any significant stenosis.     -pt having lifestyle limiting claudication in the right leg.  Duplex today reveals velocity of 587 cm/s in the mid popliteal artery and his TBI and toe pressures are also decreased.  Given this, will set him up for aortogram with RLE runoff to evaluate and possible intervention with Dr. Trula Slade.   -continue Plavix.  He is not on asa or Eliquis due to hx of GIB.  He is not on statin due to intolerance.  -the velocities in the Left leg bypass are improved from duplex 3 months ago.     Leontine Locket, [facility] [phone]  Clinic MD:   Trula Slade

## 2022-11-22 ENCOUNTER — Other Ambulatory Visit: Payer: Self-pay

## 2022-11-22 DIAGNOSIS — I739 Peripheral vascular disease, unspecified: Secondary | ICD-10-CM

## 2022-11-23 ENCOUNTER — Telehealth: Payer: Self-pay

## 2022-11-23 NOTE — Telephone Encounter (Signed)
Received a call from patient, requesting to move up procedure to original offered date of March 26. Instructions provided with new arrival time. Patient verbalized understanding.

## 2022-11-28 ENCOUNTER — Encounter (HOSPITAL_COMMUNITY)
Admission: RE | Disposition: A | Discharge status: Home / Self Care | Payer: Self-pay | Source: Ambulatory Visit | Attending: Surgery

## 2022-11-28 ENCOUNTER — Ambulatory Visit (HOSPITAL_COMMUNITY)
Admission: RE | Admit: 2022-11-28 | Discharge: 2022-11-28 | Disposition: A | Discharge status: Home / Self Care | Payer: Medicare Other | Source: Ambulatory Visit | Attending: Surgery | Admitting: Surgery

## 2022-11-28 DIAGNOSIS — Z87891 Personal history of nicotine dependence: Secondary | ICD-10-CM | POA: Insufficient documentation

## 2022-11-28 DIAGNOSIS — Y832 Surgical operation with anastomosis, bypass or graft as the cause of abnormal reaction of the patient, or of later complication, without mention of misadventure at the time of the procedure: Secondary | ICD-10-CM | POA: Diagnosis not present

## 2022-11-28 DIAGNOSIS — T82856A Stenosis of peripheral vascular stent, initial encounter: Secondary | ICD-10-CM | POA: Insufficient documentation

## 2022-11-28 DIAGNOSIS — Z79899 Other long term (current) drug therapy: Secondary | ICD-10-CM | POA: Insufficient documentation

## 2022-11-28 DIAGNOSIS — I701 Atherosclerosis of renal artery: Secondary | ICD-10-CM | POA: Diagnosis not present

## 2022-11-28 DIAGNOSIS — I70211 Atherosclerosis of native arteries of extremities with intermittent claudication, right leg: Secondary | ICD-10-CM | POA: Diagnosis not present

## 2022-11-28 DIAGNOSIS — Z7902 Long term (current) use of antithrombotics/antiplatelets: Secondary | ICD-10-CM | POA: Diagnosis not present

## 2022-11-28 DIAGNOSIS — I739 Peripheral vascular disease, unspecified: Secondary | ICD-10-CM

## 2022-11-28 DIAGNOSIS — I10 Essential (primary) hypertension: Secondary | ICD-10-CM | POA: Diagnosis not present

## 2022-11-28 HISTORY — PX: PERIPHERAL VASCULAR INTERVENTION: CATH118257

## 2022-11-28 HISTORY — PX: PERIPHERAL VASCULAR BALLOON ANGIOPLASTY: CATH118281

## 2022-11-28 HISTORY — PX: ABDOMINAL AORTOGRAM W/LOWER EXTREMITY: CATH118223

## 2022-11-28 LAB — POCT I-STAT, CHEM 8
BUN: 21 mg/dL (ref 8–23)
Calcium, Ion: 1.21 mmol/L (ref 1.15–1.40)
Chloride: 108 mmol/L (ref 98–111)
Creatinine, Ser: 1 mg/dL (ref 0.61–1.24)
Glucose, Bld: 107 mg/dL — ABNORMAL HIGH (ref 70–99)
HCT: 39 % (ref 39.0–52.0)
Hemoglobin: 13.3 g/dL (ref 13.0–17.0)
Potassium: 4 mmol/L (ref 3.5–5.1)
Sodium: 141 mmol/L (ref 135–145)
TCO2: 25 mmol/L (ref 22–32)

## 2022-11-28 LAB — POCT ACTIVATED CLOTTING TIME: Activated Clotting Time: 234 seconds

## 2022-11-28 SURGERY — ABDOMINAL AORTOGRAM W/LOWER EXTREMITY
Anesthesia: LOCAL | Laterality: Right

## 2022-11-28 MED ORDER — LIDOCAINE HCL (PF) 1 % IJ SOLN
INTRAMUSCULAR | Status: AC
  Filled 2022-11-28: qty 30

## 2022-11-28 MED ORDER — HYDRALAZINE HCL 20 MG/ML IJ SOLN: 5.0000 mg | INTRAMUSCULAR | Status: DC | PRN

## 2022-11-28 MED ORDER — LIDOCAINE HCL (PF) 1 % IJ SOLN
INTRAMUSCULAR | Status: DC | PRN
  Administered 2022-11-28: 12 mL

## 2022-11-28 MED ORDER — OXYCODONE HCL 5 MG PO TABS: 5.0000 mg | ORAL_TABLET | ORAL | Status: DC | PRN

## 2022-11-28 MED ORDER — SODIUM CHLORIDE 0.9% FLUSH: 3.0000 mL | Freq: Two times a day (BID) | INTRAVENOUS | Status: DC

## 2022-11-28 MED ORDER — FENTANYL CITRATE (PF) 100 MCG/2ML IJ SOLN
INTRAMUSCULAR | Status: AC
  Filled 2022-11-28: qty 2

## 2022-11-28 MED ORDER — FENTANYL CITRATE (PF) 100 MCG/2ML IJ SOLN
INTRAMUSCULAR | Status: DC | PRN
  Administered 2022-11-28: 25 ug via INTRAVENOUS
  Administered 2022-11-28: 50 ug via INTRAVENOUS
  Administered 2022-11-28: 25 ug via INTRAVENOUS

## 2022-11-28 MED ORDER — SODIUM CHLORIDE 0.9 % WEIGHT BASED INFUSION: 1.0000 mL/kg/h | INTRAVENOUS | Status: DC

## 2022-11-28 MED ORDER — ONDANSETRON HCL 4 MG/2ML IJ SOLN: 4.0000 mg | Freq: Four times a day (QID) | INTRAMUSCULAR | Status: DC | PRN

## 2022-11-28 MED ORDER — MIDAZOLAM HCL 2 MG/2ML IJ SOLN
INTRAMUSCULAR | Status: AC
  Filled 2022-11-28: qty 2

## 2022-11-28 MED ORDER — MORPHINE SULFATE (PF) 2 MG/ML IV SOLN: 2.0000 mg | INTRAVENOUS | Status: DC | PRN

## 2022-11-28 MED ORDER — HEPARIN (PORCINE) IN NACL 1000-0.9 UT/500ML-% IV SOLN
INTRAVENOUS | Status: DC | PRN
  Administered 2022-11-28 (×2): 500 mL

## 2022-11-28 MED ORDER — ACETAMINOPHEN 325 MG PO TABS: 650.0000 mg | ORAL_TABLET | ORAL | Status: DC | PRN

## 2022-11-28 MED ORDER — IODIXANOL 320 MG/ML IV SOLN
INTRAVENOUS | Status: DC | PRN
  Administered 2022-11-28: 175 mL

## 2022-11-28 MED ORDER — MIDAZOLAM HCL 2 MG/2ML IJ SOLN
INTRAMUSCULAR | Status: DC | PRN
  Administered 2022-11-28 (×2): 1 mg via INTRAVENOUS
  Administered 2022-11-28: 2 mg via INTRAVENOUS

## 2022-11-28 MED ORDER — SODIUM CHLORIDE 0.9 % IV SOLN: INTRAVENOUS | Status: DC

## 2022-11-28 MED ORDER — SODIUM CHLORIDE 0.9% FLUSH: 3.0000 mL | INTRAVENOUS | Status: DC | PRN

## 2022-11-28 MED ORDER — SODIUM CHLORIDE 0.9 % IV SOLN: 250.0000 mL | INTRAVENOUS | Status: DC | PRN

## 2022-11-28 MED ORDER — HEPARIN SODIUM (PORCINE) 1000 UNIT/ML IJ SOLN
INTRAMUSCULAR | Status: DC | PRN
  Administered 2022-11-28: 7000 [IU] via INTRAVENOUS

## 2022-11-28 MED ORDER — LABETALOL HCL 5 MG/ML IV SOLN: 10.0000 mg | INTRAVENOUS | Status: DC | PRN

## 2022-11-28 SURGICAL SUPPLY — 27 items
BALLN MUSTANG 5X120X135 (BALLOONS) ×2
BALLN MUSTANG 6.0X40 135 (BALLOONS) ×2
BALLN MUSTANG 7.0X40 135 (BALLOONS) ×2
BALLOON MUSTANG 5X120X135 (BALLOONS) IMPLANT
BALLOON MUSTANG 6.0X40 135 (BALLOONS) IMPLANT
BALLOON MUSTANG 7.0X40 135 (BALLOONS) IMPLANT
CATH OMNI FLUSH 5F 65CM (CATHETERS) IMPLANT
CATH QUICKCROSS .035X135CM (MICROCATHETER) IMPLANT
CLOSURE MYNX CONTROL 6F/7F (Vascular Products) IMPLANT
COVER DOME SNAP 22 D (MISCELLANEOUS) IMPLANT
GUIDEWIRE ANGLED .035X260CM (WIRE) IMPLANT
KIT ENCORE 26 ADVANTAGE (KITS) IMPLANT
KIT MICROPUNCTURE NIT STIFF (SHEATH) IMPLANT
KIT PV (KITS) ×2 IMPLANT
SHEATH CATAPULT 6FR 45 (SHEATH) IMPLANT
SHEATH PINNACLE 5F 10CM (SHEATH) IMPLANT
SHEATH PINNACLE 6F 10CM (SHEATH) IMPLANT
SHEATH PROBE COVER 6X72 (BAG) IMPLANT
STENT ELUVIA 6X150X130 (Permanent Stent) IMPLANT
STENT ZILVER PTX 8X40 (Permanent Stent) IMPLANT
STOPCOCK MORSE 400PSI 3WAY (MISCELLANEOUS) IMPLANT
SYR MEDRAD MARK 7 150ML (SYRINGE) ×2 IMPLANT
TRANSDUCER W/STOPCOCK (MISCELLANEOUS) ×2 IMPLANT
TRAY PV CATH (CUSTOM PROCEDURE TRAY) ×2 IMPLANT
TUBING CIL FLEX 10 FLL-RA (TUBING) IMPLANT
WIRE HI TORQ VERSACORE 300 (WIRE) IMPLANT
WIRE STARTER BENTSON 035X150 (WIRE) IMPLANT

## 2022-11-28 NOTE — Op Note (Signed)
ClAUDICATIOJ   Patient name: Ryan Pittman MRN: [id number] DOB: July 02, 1941 Sex: male  11/28/2022 Pre-operative Diagnosis: Claudication Post-operative diagnosis:  Same Surgeon:  [name] Procedure Performed:  1.  Ultrasound-guided access, left femoral artery  2.  Abdominal aortogram  3.  Bilateral lower extremity runoff  4.  Stent, right popliteal artery  5.  Stent, right external iliac artery  6.  Balloon angioplasty, right common iliac artery  7.  Conscious sedation, 80 minutes  8.  Closure device, Mynx   Indications: This is an 82 year old gentleman who was undergone multiple prior interventions who is now having severe lifestyle limiting claudication on the right.  Ultrasound suggested a high-grade popliteal stenosis.  He is here for further evaluation.  Procedure:  The patient was identified in the holding area and taken to room 8.  The patient was then placed supine on the table and prepped and draped in the usual sterile fashion.  A time out was called.  Conscious sedation was administered with the use of IV fentanyl and Versed under continuous physician and nurse monitoring.  Heart rate, blood pressure, and oxygen saturation were continuously monitored.  Total sedation time was 80 minutes.  Ultrasound was used to evaluate the left common femoral artery.  It was patent .  A digital ultrasound image was acquired.  A micropuncture needle was used to access the left common femoral artery under ultrasound guidance.  An 018 wire was advanced without resistance and a micropuncture sheath was placed.  The 018 wire was removed and a benson wire was placed.  The micropuncture sheath was exchanged for a 5 french sheath.  An omniflush catheter was advanced over the wire to the level of L-1.  An abdominal angiogram was obtained.  Next, using the omniflush catheter and a benson wire, the aortic bifurcation was crossed and the catheter was placed into theright external iliac artery and right  runoff was obtained.  left runoff was performed via retrograde sheath injections.  Findings:   Aortogram: The right renal artery is widely patent.  There is greater than 50% stenosis of the left renal artery.  The infrarenal abdominal aorta is without significant stenosis.  The left common and external iliac arteries are widely patent as are other stents.  The right common iliac artery has in-stent stenosis proximally.  The right external iliac stent is patent with moderate in-stent stenosis however there does appear to be a high-grade lesion, greater than 50% at the distal edge of the stent  Right Lower Extremity: The right common femoral and profundofemoral artery are widely patent.  The superficial femoral artery is widely patent as are the associated stents.  The popliteal artery is patent however there is a 90% stenosis just above the joint space.  The below-knee popliteal artery is without stenosis.  There is two-vessel runoff via the anterior tibial and peroneal artery  Left Lower Extremity: The left common femoral and profundofemoral artery are patent.  There is a bypass graft originating from the common femoral artery which is widely patent without significant stenosis.  The distal anastomosis is to the popliteal artery without stenosis.  There is two-vessel runoff via the anterior tibial and peroneal artery  Intervention: After the above images were acquired the decision was made to proceed with intervention.  A 6 French 45 cm catapulted sheath was inserted into the right extrailiac artery.  The patient was fully heparinized.  Heparin levels were redosed based off ACT measurements.  Next using an Orr and  a quick cross catheter, the lesion in the popliteal artery was successfully crossed.  A versa core wire was then placed.  Next I selected a 6 x 150 Eluvia stent and deployed this at the joint space, extending back to the previously deployed stents in the superficial femoral artery.  I then  molded the stent with a 5 mm balloon.  There was residual stenosis at the lesion and so this was treated with a 6 x 40 Mustang balloon.  Completion imaging showed resolution of the stenosis and widely patent superficial femoral artery.  Attention was then turned towards the right external iliac artery.  I elected to primarily stent this.  I selected a Cook Zilver PTX 8 x 40.  This was deployed evenly across the distal edge of the stent.  It was molded with a 7 mm balloon.  There also appeared to be in-stent stenosis within the previously deployed common iliac stents.  These were treated with the 7 mm balloon.  Follow-up imaging revealed resolution of stenosis within the right iliac system.  Next, the long sheath was exchanged out for short 6 French sheath in the groin was closed with a Mynx.  Impression:  #1  90% right popliteal artery stenosis successfully treated using a 6 x 150 Eluvia stent.  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 [name], M.D., [facility] [place] Office: [phone] Pager:  [phone]

## 2022-11-28 NOTE — Interval H&P Note (Signed)
History and Physical Interval Note:  11/28/2022 2:30 PM  Ryan Pittman  has presented today for surgery, with the diagnosis of PAD with claudication.  The various methods of treatment have been discussed with the patient and family. After consideration of risks, benefits and other options for treatment, the patient has consented to  Procedure(s): ABDOMINAL AORTOGRAM W/LOWER EXTREMITY (N/A) as a surgical intervention.  The patient's history has been reviewed, patient examined, no change in status, stable for surgery.  I have reviewed the patient's chart and labs.  Questions were answered to the patient's satisfaction.     [name]

## 2022-11-28 NOTE — Progress Notes (Signed)
PHARMACIST LIPID MONITORING   Ryan Pittman is a 82 y.o. male admitted on 11/28/2022 with claudication.  Pharmacy has been consulted to optimize lipid-lowering therapy with the indication of secondary prevention for clinical ASCVD.  Recent Labs:  Lipid Panel (last 6 months):   Lab Results  Component Value Date   CHOL 139 10/23/2022   TRIG 200.0 (H) 10/23/2022   HDL 65.70 10/23/2022   CHOLHDL 2 10/23/2022   VLDL 40.0 10/23/2022   LDLCALC 34 10/23/2022    Hepatic function panel (last 6 months):   No results found for: "AST", "ALT", "ALKPHOS", "BILITOT", "BILIDIR", "IBILI"  SCr (since admission):   Serum creatinine: 1 mg/dL 11/28/22 1129 Estimated creatinine clearance: 52.3 mL/min  Current therapy and lipid therapy tolerance Current lipid-lowering therapy: repatha Previous lipid-lowering therapies (if applicable): pravastatin, fenofibrate Documented or reported allergies or intolerances to lipid-lowering therapies (if applicable): atorvastatin (myalgias)   Assessment:   LDL currently controlled on repatha   Plan:    1.Statin intensity (high intensity recommended for all patients regardless of the LDL):  No statin changes. The patient is already on appropriate lipid lowering medications.  2.Add ezetimibe (if any one of the following):   Not indicated at this time.  3.Refer to lipid clinic:   No  4.Follow-up with:  Primary care provider - Tonia Ghent, MD  5.Follow-up labs after discharge:  No changes in lipid therapy, repeat a lipid panel in one year.     Ryan Pittman, PharmD, [place] Clinical Pharmacist  Phone: [phone] 11/28/2022 6:31 PM  Please check AMION for all [facility] phone numbers After 10:00 PM, call [facility] [phone]

## 2022-11-29 ENCOUNTER — Encounter (HOSPITAL_COMMUNITY): Payer: Self-pay | Admitting: Surgery

## 2022-12-06 ENCOUNTER — Other Ambulatory Visit: Payer: Self-pay | Admitting: Physician Assistant

## 2022-12-06 DIAGNOSIS — J449 Chronic obstructive pulmonary disease, unspecified: Secondary | ICD-10-CM | POA: Diagnosis not present

## 2022-12-13 ENCOUNTER — Telehealth: Payer: Self-pay

## 2022-12-13 ENCOUNTER — Encounter: Payer: Self-pay | Admitting: Oncology

## 2022-12-13 ENCOUNTER — Other Ambulatory Visit (HOSPITAL_COMMUNITY): Payer: Self-pay

## 2022-12-13 NOTE — Telephone Encounter (Signed)
Pharmacy Patient Advocate Encounter   Received notification from [facility] that prior authorization for Repatha 140mg /ml is required/requested.     PA submitted on 4.10.24 to (ins) [employer] [place] via CoverMyMeds  Key # [id number]   Status is pending   I Will inform the pharmacy of the determination

## 2022-12-13 NOTE — Telephone Encounter (Signed)
Pharmacy Patient Advocate Encounter  Prior Authorization for Repatha 140mg /ml has been approved by bcbs part D (ins).    key# [id number]    Effective dates: 4.10.24 through 4.10.25

## 2022-12-19 DIAGNOSIS — R600 Localized edema: Secondary | ICD-10-CM | POA: Diagnosis not present

## 2022-12-21 ENCOUNTER — Other Ambulatory Visit: Payer: Self-pay | Admitting: *Deleted

## 2022-12-21 DIAGNOSIS — I739 Peripheral vascular disease, unspecified: Secondary | ICD-10-CM

## 2022-12-25 ENCOUNTER — Ambulatory Visit (INDEPENDENT_AMBULATORY_CARE_PROVIDER_SITE_OTHER): Payer: Medicare Other

## 2022-12-25 ENCOUNTER — Encounter: Payer: Self-pay | Admitting: Oncology

## 2022-12-25 VITALS — Ht 66.5 in | Wt 155.0 lb

## 2022-12-25 DIAGNOSIS — Z0000 Encounter for general adult medical examination without abnormal findings: Secondary | ICD-10-CM | POA: Diagnosis not present

## 2022-12-25 NOTE — Patient Instructions (Signed)
Ryan Pittman , Thank you for taking time to come for your Medicare Wellness Visit. I appreciate your ongoing commitment to your health goals. Please review the following plan we discussed and let me know if I can assist you in the future.   These are the goals we discussed:  Goals       DIET - EAT MORE FRUITS AND VEGETABLES      Increase physical activity (pt-stated)      Starting 03/27/2018, I will continue to golf for at least 4 hrs 1-2 days per week and limit intake of saturated fat.       Patient Stated      04/04/2019, no goals      Patient Stated      12/21/2020, I will continue to do strength training at home for about 15 minutes 3-4 days a week.       Patient Stated      No new goal      Track and Manage My Blood Pressure-Hypertension      Timeframe:  Long-Range Goal Priority:  High Start Date:            09/28/21                 Expected End Date:        09/28/22               Follow Up Date July 2023  - check blood pressure daily - choose a place to take my blood pressure (home, clinic or office, retail store) - write blood pressure results in a log or diary    Why is this important?   You won't feel high blood pressure, but it can still hurt your blood vessels.  High blood pressure can cause heart or kidney problems. It can also cause a stroke.  Making lifestyle changes like losing a little weight or eating less salt will help.  Checking your blood pressure at home and at different times of the day can help to control blood pressure.  If the doctor prescribes medicine remember to take it the way the doctor ordered.  Call the office if you cannot afford the medicine or if there are questions about it.     Notes:         This is a list of the screening recommended for you and due dates:  Health Maintenance  Topic Date Due   COVID-19 Vaccine (3 - Moderna risk series) 08/31/2020   DTaP/Tdap/Td vaccine (2 - Tdap) 03/12/2022   Medicare Annual Wellness Visit  12/23/2022    Flu Shot  04/05/2023   Colon Cancer Screening  10/09/2024   Pneumonia Vaccine  Completed   Zoster (Shingles) Vaccine  Completed   HPV Vaccine  Aged Out    Advanced directives: none  Conditions/risks identified: Aim for 30 minutes of exercise or brisk walking, 6-8 glasses of water, and 5 servings of fruits and vegetables each day.   Next appointment: Follow up in one year for your annual wellness visit. 12/27/23 @ 11:30 telephone visit  Preventive Care 65 Years and Older, Male  Preventive care refers to lifestyle choices and visits with your health care provider that can promote health and wellness. What does preventive care include? A yearly physical exam. This is also called an annual well check. Dental exams once or twice a year. Routine eye exams. Ask your health care provider how often you should have your eyes checked. Personal lifestyle choices, including: Daily  care of your teeth and gums. Regular physical activity. Eating a healthy diet. Avoiding tobacco and drug use. Limiting alcohol use. Practicing safe sex. Taking low doses of aspirin every day. Taking vitamin and mineral supplements as recommended by your health care provider. What happens during an annual well check? The services and screenings done by your health care provider during your annual well check will depend on your age, overall health, lifestyle risk factors, and family history of disease. Counseling  Your health care provider may ask you questions about your: Alcohol use. Tobacco use. Drug use. Emotional well-being. Home and relationship well-being. Sexual activity. Eating habits. History of falls. Memory and ability to understand (cognition). Work and work Astronomer. Screening  You may have the following tests or measurements: Height, weight, and BMI. Blood pressure. Lipid and cholesterol levels. These may be checked every 5 years, or more frequently if you are over 73 years old. Skin  check. Lung cancer screening. You may have this screening every year starting at age 35 if you have a 30-pack-year history of smoking and currently smoke or have quit within the past 15 years. Fecal occult blood test (FOBT) of the stool. You may have this test every year starting at age 36. Flexible sigmoidoscopy or colonoscopy. You may have a sigmoidoscopy every 5 years or a colonoscopy every 10 years starting at age 73. Prostate cancer screening. Recommendations will vary depending on your family history and other risks. Hepatitis C blood test. Hepatitis B blood test. Sexually transmitted disease (STD) testing. Diabetes screening. This is done by checking your blood sugar (glucose) after you have not eaten for a while (fasting). You may have this done every 1-3 years. Abdominal aortic aneurysm (AAA) screening. You may need this if you are a current or former smoker. Osteoporosis. You may be screened starting at age 69 if you are at high risk. Talk with your health care provider about your test results, treatment options, and if necessary, the need for more tests. Vaccines  Your health care provider may recommend certain vaccines, such as: Influenza vaccine. This is recommended every year. Tetanus, diphtheria, and acellular pertussis (Tdap, Td) vaccine. You may need a Td booster every 10 years. Zoster vaccine. You may need this after age 40. Pneumococcal 13-valent conjugate (PCV13) vaccine. One dose is recommended after age 92. Pneumococcal polysaccharide (PPSV23) vaccine. One dose is recommended after age 79. Talk to your health care provider about which screenings and vaccines you need and how often you need them. This information is not intended to replace advice given to you by your health care provider. Make sure you discuss any questions you have with your health care provider. Document Released: 09/17/2015 Document Revised: 05/10/2016 Document Reviewed: 06/22/2015 Elsevier Interactive  Patient Education  2017 [employer].  Fall Prevention in the Home Falls can cause injuries. They can happen to people of all ages. There are many things you can do to make your home safe and to help prevent falls. What can I do on the outside of my home? Regularly fix the edges of walkways and driveways and fix any cracks. Remove anything that might make you trip as you walk through a door, such as a raised step or threshold. Trim any bushes or trees on the path to your home. Use bright outdoor lighting. Clear any walking paths of anything that might make someone trip, such as rocks or tools. Regularly check to see if handrails are loose or broken. Make sure that both sides of  any steps have handrails. Any raised decks and porches should have guardrails on the edges. Have any leaves, snow, or ice cleared regularly. Use sand or salt on walking paths during winter. Clean up any spills in your garage right away. This includes oil or grease spills. What can I do in the bathroom? Use night lights. Install grab bars by the toilet and in the tub and shower. Do not use towel bars as grab bars. Use non-skid mats or decals in the tub or shower. If you need to sit down in the shower, use a plastic, non-slip stool. Keep the floor dry. Clean up any water that spills on the floor as soon as it happens. Remove soap buildup in the tub or shower regularly. Attach bath mats securely with double-sided non-slip rug tape. Do not have throw rugs and other things on the floor that can make you trip. What can I do in the bedroom? Use night lights. Make sure that you have a light by your bed that is easy to reach. Do not use any sheets or blankets that are too big for your bed. They should not hang down onto the floor. Have a firm chair that has side arms. You can use this for support while you get dressed. Do not have throw rugs and other things on the floor that can make you trip. What can I do in the  kitchen? Clean up any spills right away. Avoid walking on wet floors. Keep items that you use a lot in easy-to-reach places. If you need to reach something above you, use a strong step stool that has a grab bar. Keep electrical cords out of the way. Do not use floor polish or wax that makes floors slippery. If you must use wax, use non-skid floor wax. Do not have throw rugs and other things on the floor that can make you trip. What can I do with my stairs? Do not leave any items on the stairs. Make sure that there are handrails on both sides of the stairs and use them. Fix handrails that are broken or loose. Make sure that handrails are as long as the stairways. Check any carpeting to make sure that it is firmly attached to the stairs. Fix any carpet that is loose or worn. Avoid having throw rugs at the top or bottom of the stairs. If you do have throw rugs, attach them to the floor with carpet tape. Make sure that you have a light switch at the top of the stairs and the bottom of the stairs. If you do not have them, ask someone to add them for you. What else can I do to help prevent falls? Wear shoes that: Do not have high heels. Have rubber bottoms. Are comfortable and fit you well. Are closed at the toe. Do not wear sandals. If you use a stepladder: Make sure that it is fully opened. Do not climb a closed stepladder. Make sure that both sides of the stepladder are locked into place. Ask someone to hold it for you, if possible. Clearly mark and make sure that you can see: Any grab bars or handrails. First and last steps. Where the edge of each step is. Use tools that help you move around (mobility aids) if they are needed. These include: Canes. Walkers. Scooters. Crutches. Turn on the lights when you go into a dark area. Replace any light bulbs as soon as they burn out. Set up your furniture so you have a clear  path. Avoid moving your furniture around. If any of your floors are  uneven, fix them. If there are any pets around you, be aware of where they are. Review your medicines with your doctor. Some medicines can make you feel dizzy. This can increase your chance of falling. Ask your doctor what other things that you can do to help prevent falls. This information is not intended to replace advice given to you by your health care provider. Make sure you discuss any questions you have with your health care provider. Document Released: 06/17/2009 Document Revised: 01/27/2016 Document Reviewed: 09/25/2014 Elsevier Interactive Patient Education  2017 [employer].

## 2022-12-25 NOTE — Progress Notes (Signed)
I connected with  Beckey Rutter on 12/25/22 by a audio enabled telemedicine application and verified that I am speaking with the correct person using two identifiers.  Patient Location: Home  Provider Location: Home Office  I discussed the limitations of evaluation and management by telemedicine. The patient expressed understanding and agreed to proceed.  Subjective:   KHADIR ROAM is a 82 y.o. male who presents for Medicare Annual/Subsequent preventive examination.  Review of Systems      Cardiac Risk Factors include: advanced age (>65men, >67 women);hypertension;male gender;sedentary lifestyle     Objective:    Today's Vitals   12/25/22 1458  Weight: 155 lb (70.3 kg)  Height: 5' 6.5" (1.689 m)   Body mass index is 24.64 kg/m.     12/25/2022    3:12 PM 11/28/2022   11:11 AM 08/11/2022   10:59 AM 06/29/2022    6:43 AM 02/08/2022   12:58 PM 12/29/2021    1:05 PM 12/22/2021    3:34 PM  Advanced Directives  Does Patient Have a Medical Advance Directive? No No No No No No No  Does patient want to make changes to medical advance directive?       No - Patient declined  Would patient like information on creating a medical advance directive? No - Patient declined No - Patient declined No - Patient declined No - Patient declined  No - Patient declined No - Patient declined    Current Medications (verified) Outpatient Encounter Medications as of 12/25/2022  Medication Sig   acetaminophen (TYLENOL) 500 MG tablet Take 1,000 mg by mouth every 6 (six) hours as needed for moderate pain or headache.   Cholecalciferol (DIALYVITE VITAMIN D 5000 PO) Take 5,000 Units by mouth daily.   clopidogrel (PLAVIX) 75 MG tablet Take 1 tablet (75 mg total) by mouth daily.   levothyroxine (SYNTHROID) 75 MCG tablet TAKE 1 TABLET BY MOUTH DAILY EXCEPT ON THURSDAYS TAKE 1.5 TABLETS   losartan (COZAAR) 25 MG tablet Take 25 mg by mouth daily.   Multiple Vitamins-Minerals (MULTIVITAMIN MEN 50+) TABS Take  1 tablet by mouth in the morning.   REPATHA SURECLICK 140 MG/ML SOAJ INJECT 140MG  EVERY 14 DAYS   tamsulosin (FLOMAX) 0.4 MG CAPS capsule Take 1 capsule (0.4 mg total) by mouth daily.   amLODipine (NORVASC) 10 MG tablet TAKE 1 TABLET BY MOUTH DAILY (Patient not taking: Reported on 12/25/2022)   ferrous sulfate 325 (65 FE) MG EC tablet Take 1 tablet (325 mg total) by mouth daily. (Patient not taking: Reported on 12/25/2022)   hydrOXYzine (ATARAX) 10 MG tablet ONE-HALF TO ONE TABLET 3 TIMES DAILY AS NEEDED FOR ANXIETY (Patient not taking: Reported on 11/24/2022)   losartan (COZAAR) 50 MG tablet Held as of 08/24/22 (Patient not taking: Reported on 11/24/2022)   metoprolol tartrate (LOPRESSOR) 25 MG tablet Take 1 tablet (25 mg total) by mouth 2 (two) times daily. (Patient not taking: Reported on 12/25/2022)   pantoprazole (PROTONIX) 40 MG tablet TAKE 1 TABLET BY MOUTH DAILY (Patient not taking: Reported on 12/25/2022)   pramipexole (MIRAPEX) 1 MG tablet Take 1 mg by mouth daily as needed (restless leg). (Patient not taking: Reported on 12/25/2022)   No facility-administered encounter medications on file as of 12/25/2022.    Allergies (verified) Penicillins, Ace inhibitors, Aspirin, Celebrex [celecoxib], and Lipitor [atorvastatin]   History: Past Medical History:  Diagnosis Date   Anemia    due to GIB, s/p transfusion   Anginal pain 07/10/2018   Arthritis  back pain, much worse after consecutive golf rounds   Cancer    thyroid   Chronic kidney disease    Colon polyps    Coronary artery disease    COVID-19    Dyspnea    walking up a hill   Dysrhythmia 2020   GERD (gastroesophageal reflux disease)    Hypercalcemia    h/o, resolved as of 2012, prev due to high amount of calcium intake   Hyperlipidemia    Hypertension    Hypothyroidism    IgG gammopathy    stable as of 2012 per [facility]   Pneumonia    as a child   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 Excell Seltzer   PVD (peripheral vascular disease)    L leg bypass, R leg stented   Past Surgical History:  Procedure Laterality Date    dental implant left bottom-did have bleeding      ABDOMINAL AORTOGRAM W/LOWER EXTREMITY Bilateral 06/26/2019   Procedure: ABDOMINAL AORTOGRAM W/LOWER EXTREMITY;  Surgeon: Runell Gess, MD;  Location: [facility];  Service: Cardiovascular;  Laterality: Bilateral;   ABDOMINAL AORTOGRAM W/LOWER EXTREMITY N/A 06/08/2020   Procedure: ABDOMINAL AORTOGRAM W/LOWER EXTREMITY;  Surgeon: Nada Libman, MD;  Location: [facility];  Service: Cardiovascular;  Laterality: N/A;   ABDOMINAL AORTOGRAM W/LOWER EXTREMITY N/A 11/28/2022   Procedure: ABDOMINAL AORTOGRAM W/LOWER EXTREMITY;  Surgeon: Nada Libman, MD;  Location: [facility];  Service: Cardiovascular;  Laterality: N/A;   BYPASS GRAFT     L leg   CARDIAC CATHETERIZATION  06/24/2018   COLONOSCOPY N/A 10/09/2021   Procedure: COLONOSCOPY;  Surgeon: Toledo, Boykin Nearing, MD;  Location: [facility] ENDOSCOPY;  Service: Gastroenterology;  Laterality: N/A;   COLONOSCOPY WITH PROPOFOL N/A 05/04/2015   Procedure: COLONOSCOPY WITH PROPOFOL;  Surgeon: Christena Deem, MD;  Location: [facility] ENDOSCOPY;  Service: Endoscopy;  Laterality: N/A;   CORONARY ARTERY BYPASS GRAFT N/A 07/11/2018   Procedure: CORONARY ARTERY BYPASS GRAFTING (CABG) x three, using left internal mammary artery and right leg greater saphenous vein harvested endoscopically;  Surgeon: Alleen Borne, MD;  Location: [facility];  Service: Open Heart Surgery;  Laterality: N/A;   ENDARTERECTOMY FEMORAL Right 02/13/2020   Procedure: RIGHT ENDARTERECTOMY FEMORAL WITH BOVINE PATCH;  Surgeon: Nada Libman, MD;  Location: [facility];  Service: Vascular;  Laterality: Right;   ESOPHAGOGASTRODUODENOSCOPY N/A 10/09/2021   Procedure: ESOPHAGOGASTRODUODENOSCOPY (EGD);  Surgeon: Toledo, Boykin Nearing, MD;  Location: [facility] ENDOSCOPY;  Service:  Gastroenterology;  Laterality: N/A;   GIVENS CAPSULE STUDY N/A 10/09/2021   Procedure: GIVENS CAPSULE STUDY;  Surgeon: Toledo, Boykin Nearing, MD;  Location: [facility] ENDOSCOPY;  Service: Gastroenterology;  Laterality: N/A;   LEFT ATRIAL APPENDAGE OCCLUSION N/A 06/29/2022   Procedure: LEFT ATRIAL APPENDAGE OCCLUSION;  Surgeon: Tonny Bollman, MD;  Location: [facility] INVASIVE CV LAB;  Service: Cardiovascular;  Laterality: N/A;   LEFT HEART CATH AND CORONARY ANGIOGRAPHY N/A 06/24/2018   Procedure: LEFT HEART CATH AND CORONARY ANGIOGRAPHY;  Surgeon: Runell Gess, MD;  [facility];  Service: Cardiovascular;  Laterality: N/A;   LOWER EXTREMITY ANGIOGRAPHY Bilateral 10/25/2021   Procedure: Lower Extremity Angiography;  Surgeon: Nada Libman, MD;  [facility];  Service: Cardiovascular;  Laterality: Bilateral;   PERIPHERAL VASCULAR ATHERECTOMY Right 06/08/2020   Procedure: PERIPHERAL VASCULAR ATHERECTOMY;  Surgeon: Nada Libman, MD;  [facility];  Service: Cardiovascular;  Laterality: Right;  superficial femoral   PERIPHERAL VASCULAR BALLOON ANGIOPLASTY Right 06/08/2020   Procedure: PERIPHERAL VASCULAR BALLOON ANGIOPLASTY;  Surgeon: Nada Libman, MD;  [facility];  Service: Cardiovascular;  Laterality: Right;  External iliac   PERIPHERAL VASCULAR BALLOON ANGIOPLASTY Right 11/28/2022   Procedure: PERIPHERAL VASCULAR BALLOON ANGIOPLASTY;  Surgeon: Nada Libman, MD;  [facility];  Service: Cardiovascular;  Laterality: Right;  common illiac   PERIPHERAL VASCULAR INTERVENTION Right 10/25/2021   Procedure: PERIPHERAL VASCULAR INTERVENTION;  Surgeon: Nada Libman, MD;  [facility];  Service: Cardiovascular;  Laterality: Right;  SFA   PERIPHERAL VASCULAR INTERVENTION Right 11/28/2022   Procedure: PERIPHERAL VASCULAR INTERVENTION;  Surgeon: Nada Libman, MD;  [facility];  Service:  Cardiovascular;  Laterality: Right;   RENAL ANGIOGRAPHY Bilateral 10/25/2021   Procedure: RENAL ANGIOGRAPHY;  Surgeon: Nada Libman, MD;  [facility];  Service: Cardiovascular;  Laterality: Bilateral;   TEE WITHOUT CARDIOVERSION N/A 07/11/2018   Procedure: TRANSESOPHAGEAL ECHOCARDIOGRAM (TEE);  Surgeon: Alleen Borne, MD;  Location: [facility] OR;  Service: Open Heart Surgery;  Laterality: N/A;   TEE WITHOUT CARDIOVERSION N/A 06/29/2022   Procedure: TRANSESOPHAGEAL ECHOCARDIOGRAM (TEE);  Surgeon: Tonny Bollman, MD;  Location: [facility] INVASIVE CV LAB;  Service: Cardiovascular;  Laterality: N/A;   TEE WITHOUT CARDIOVERSION N/A 08/11/2022   Procedure: TRANSESOPHAGEAL ECHOCARDIOGRAM (TEE);  Surgeon: Thurmon Fair, MD;  Location: [facility] ENDOSCOPY;  Service: Cardiovascular;  Laterality: N/A;   THYROIDECTOMY, PARTIAL  2016   VASCULAR SURGERY     Family History  Problem Relation Age of Onset   Diabetes Mother    Stroke Father    Colon cancer Neg Hx    Prostate cancer Neg Hx    Social History   Socioeconomic History   Marital status: Married    Spouse name: Not on file   Number of children: Not on file   Years of education: Not on file   Highest education level: Not on file  Occupational History   Not on file  Tobacco Use   Smoking status: Former    Types: Cigarettes    Quit date: 09/04/2008    Years since quitting: 14.3    Passive exposure: Never   Smokeless tobacco: Former    Types: Chew   Tobacco comments:    HAs quit tobacco products 2009  Vaping Use   Vaping Use: Never used  Substance and Sexual Activity   Alcohol use: Yes    Comment: occ   Drug use: No   Sexual activity: Yes  Other Topics Concern   Not on file  Social History Narrative   Married 50+ years, 2 kids   Runs [employer]   Likes to play golf   Social Determinants of Health   Financial Resource Strain: Low Risk  (12/25/2022)   Overall Financial Resource Strain (CARDIA)    Difficulty of Paying  Living Expenses: Not hard at all  Food Insecurity: No Food Insecurity (12/25/2022)   Hunger Vital Sign    Worried About Running Out of Food in the Last Year: Never true    Ran Out of Food in the Last Year: Never true  Transportation Needs: No Transportation Needs (12/25/2022)   PRAPARE - Administrator, Civil Service (Medical): No    Lack of Transportation (Non-Medical): No  Physical Activity: Insufficiently Active (12/25/2022)   Exercise Vital Sign    Days of Exercise per Week:  5 days    Minutes of Exercise per Session: 20 min  Stress: No Stress Concern Present (12/25/2022)   [employer] of Occupational Health - Occupational Stress Questionnaire    Feeling of Stress : Not at all  Social Connections: Moderately Integrated (12/25/2022)   Social Connection and Isolation Panel [NHANES]    Frequency of Communication with Friends and Family: More than three times a week    Frequency of Social Gatherings with Friends and Family: More than three times a week    Attends Religious Services: More than 4 times per year    Active Member of [employer] or Organizations: No    Attends Banker Meetings: Never    Marital Status: Married    Tobacco Counseling Counseling given: Not Answered Tobacco comments: HAs quit tobacco products 2009   Clinical Intake:  Pre-visit preparation completed: Yes  Pain : No/denies pain     Nutritional Risks: None Diabetes: No  How often do you need to have someone help you when you read instructions, pamphlets, or other written materials from your doctor or pharmacy?: 1 - Never  Diabetic? no  Interpreter Needed?: No  Information entered by :: c.Mcullen LPN   Activities of Daily Living    12/25/2022    3:12 PM 06/29/2022    6:49 AM  In your present state of health, do you have any difficulty performing the following activities:  Hearing? 1   Vision? 1   Difficulty concentrating or making decisions? 1   Walking or climbing  stairs? 1   Dressing or bathing? 1   Doing errands, shopping? 1 0  Preparing Food and eating ? Y   Using the Toilet? Y   In the past six months, have you accidently leaked urine? Y   Do you have problems with loss of bowel control? Y   Managing your Medications? Y   Managing your Finances? Y   Housekeeping or managing your Housekeeping? Y     Patient Care Team: Joaquim Nam, MD as PCP - General (Family Medicine) Runell Gess, MD as PCP - Cardiology (Cardiology) Irene Limbo., MD as Consulting Physician (Ophthalmology) Verna Czech. Stoiff, MD as Consulting Physician (Urology) Marcelle Overlie, MS, CCC-A as Consulting Physician (Audiology) Molli Hazard, Thomasene Ripple, NP as Nurse Practitioner (Cardiology) Kathyrn Sheriff, [facility] as Pharmacist (Pharmacist) Stanton Kidney, MD as Consulting Physician (Gastroenterology) Rickard Patience, MD as Consulting Physician (Oncology) Vida Rigger, MD as Consulting Physician (Pulmonary Disease)  Indicate any recent Medical Services you may have received from other than [facility] providers in the past year (date may be approximate).     Assessment:   This is a routine wellness examination for Sufyan.  Hearing/Vision screen Hearing Screening - Comments:: aids Vision Screening - Comments:: No glasses -Unknown provider  Dietary issues and exercise activities discussed: Current Exercise Habits: Home exercise routine, Type of exercise: walking;strength training/weights, Time (Minutes): 20, Frequency (Times/Week): 3, Weekly Exercise (Minutes/Week): 60, Intensity: Mild, Exercise limited by: None identified   Goals Addressed             This Visit's Progress    Patient Stated       No new goal       Depression Screen    12/25/2022    3:10 PM 12/22/2021    3:32 PM 12/21/2020   12:02 PM 04/25/2019   12:15 PM 04/04/2019    9:48 AM 09/23/2018   12:31 PM 03/27/2018   10:46 AM  PHQ 2/9 Scores  PHQ - 2 Score 0 0 0 0 0 0 0  PHQ- 9 Score   0  0 1 0     Fall Risk    12/25/2022    3:03 PM 12/22/2021    3:35 PM 12/21/2020   12:02 PM 04/25/2019   12:14 PM 04/04/2019    9:48 AM  Fall Risk   Falls in the past year? 0 0 0 0 0  Number falls in past yr: 0 0 0    Injury with Fall? 0 0 0    Risk for fall due to : No Fall Risks No Fall Risks Medication side effect  Medication side effect  Follow up Falls prevention discussed;Falls evaluation completed Falls evaluation completed Falls evaluation completed;Falls prevention discussed  Falls evaluation completed;Falls prevention discussed    FALL RISK PREVENTION PERTAINING TO THE HOME:  Any stairs in or around the home? Yes  If so, are there any without handrails? No  Home free of loose throw rugs in walkways, pet beds, electrical cords, etc? Yes  Adequate lighting in your home to reduce risk of falls? Yes   ASSISTIVE DEVICES UTILIZED TO PREVENT FALLS:  Life alert? No  Use of a cane, walker or w/c? No  Grab bars in the bathroom? No  Shower chair or bench in shower? Yes  Elevated toilet seat or a handicapped toilet? No    Cognitive Function:    12/21/2020   12:05 PM 04/04/2019    9:51 AM 03/27/2018   10:46 AM 03/22/2017    8:52 AM 12/24/2015    9:34 AM  MMSE - Mini Mental State Exam  Orientation to time 5 4 5 5 5   Orientation to Place 5 5 5 5 5   Registration 3 3 3 3 3   Attention/ Calculation 5 0 0 0 0  Recall 3 3 3 3 3   Language- name 2 objects  0 0 0 0  Language- repeat 1 0 1 1 1   Language- follow 3 step command  0 3 3 3   Language- read & follow direction  0 0 0 0  Write a sentence  0 0 0 0  Copy design  0 0 0 0  Total score  15 20 20 20         12/25/2022    3:13 PM  6CIT Screen  What Year? 0 points  What month? 0 points  What time? 0 points  Count back from 20 0 points  Months in reverse 0 points  Repeat phrase 2 points  Total Score 2 points    Immunizations Immunization History  Administered Date(s) Administered   Influenza, High Dose Seasonal PF 05/19/2016,  05/19/2019, 06/01/2020, 07/25/2022   Influenza-Unspecified 06/18/2014, 07/02/2017, 06/12/2018   Moderna SARS-COV2 Booster Vaccination 08/03/2020   Moderna Sars-Covid-2 Vaccination 10/18/2019, 11/15/2019   Pneumococcal Conjugate-13 04/07/2014   Pneumococcal Polysaccharide-23 04/28/2010   Td 03/12/2012   Zoster Recombinat (Shingrix) 06/14/2022, 10/31/2022   Zoster, Live 06/07/2010    TDAP status: Up to date  Flu Vaccine status: Up to date  Pneumococcal vaccine status: Up to date  Covid-19 vaccine status: Information provided on how to obtain vaccines.   Qualifies for Shingles Vaccine? Yes   Zostavax completed Yes   Shingrix Completed?: Yes  Screening Tests Health Maintenance  Topic Date Due   COVID-19 Vaccine (3 - Moderna risk series) 08/31/2020   DTaP/Tdap/Td (2 - Tdap) 03/12/2022   INFLUENZA VACCINE  04/05/2023   Medicare Annual Wellness (AWV)  12/25/2023   COLONOSCOPY (Pts  45-64yrs Insurance coverage will need to be confirmed)  10/09/2024   Pneumonia Vaccine 54+ Years old  Completed   Zoster Vaccines- Shingrix  Completed   HPV VACCINES  Aged Out    Health Maintenance  Health Maintenance Due  Topic Date Due   COVID-19 Vaccine (3 - Moderna risk series) 08/31/2020   DTaP/Tdap/Td (2 - Tdap) 03/12/2022    Colorectal cancer screening: No longer required.   Lung Cancer Screening: (Low Dose CT Chest recommended if Age 26-80 years, 30 pack-year currently smoking OR have quit w/in 15years.) does not qualify.   Lung Cancer Screening Referral: no  Additional Screening:  Hepatitis C Screening: does not qualify; Completed no  Vision Screening: Recommended annual ophthalmology exams for early detection of glaucoma and other disorders of the eye. Is the patient up to date with their annual eye exam?  Yes  Who is the provider or what is the name of the office in which the patient attends annual eye exams? UnKnown Provider If pt is not established with a provider, would they  like to be referred to a provider to establish care? No .   Dental Screening: Recommended annual dental exams for proper oral hygiene  Community Resource Referral / Chronic Care Management: CRR required this visit?  No   CCM required this visit?  No      Plan:     I have personally reviewed and noted the following in the patient's chart:   Medical and social history Use of alcohol, tobacco or illicit drugs  Current medications and supplements including opioid prescriptions. Patient is not currently taking opioid prescriptions. Functional ability and status Nutritional status Physical activity Advanced directives List of other physicians Hospitalizations, surgeries, and ER visits in previous 12 months Vitals Screenings to include cognitive, depression, and falls Referrals and appointments  In addition, I have reviewed and discussed with patient certain preventive protocols, quality metrics, and best practice recommendations. A written personalized care plan for preventive services as well as general preventive health recommendations were provided to patient.     Maryan Puls, LPN   1/61/0960   Nurse Notes: none

## 2022-12-26 NOTE — Progress Notes (Unsigned)
[facility]                                     Cardiology Office Note:    Date:  12/27/2022   ID:  SENDER RUEB, DOB [birth year], MRN [id number]  PCP:  Joaquim Nam, MD  [facility] Cardiologist:  Nanetta Batty, MD / Dr. Excell Seltzer, MD [facility])  [facility] Electrophysiologist:  None   Referring MD: Joaquim Nam, MD   Chief Complaint  Patient presents with   Follow-up    6 month s/p LAAO    History of Present Illness:    Ryan Pittman is a 82 y.o. male with a hx of PAD (L fempop bypass and R SFA stenting), CAD s/p CABG, HTN, hypothyroidism, small bowel AVMs, PAF with recurrent GI bleeding requiring transfusions on Eliquis (therefore discontinued) who is now s/p LAAO with Watchman and is being seen today for 6 month follow up.    Mr. Adames was referred to Dr. Excell Seltzer for the evaluation of possible LAAO due to significant bleeding while on anticoagulation. He was hospitalized in 10/2021 found to have a hemoglobin down to about 7 mg/dL. He received 2 units PRBCs with Hb improvement. EGD and colonoscopy at that time was essentially unrevealing but capsule endoscopy demonstrated multiple AVMs in the small bowel without active bleeding.   On Watchman consult, he was having trouble even tolerating low dose Eliquis 2.5 mg twice daily. His stools were guaiac positive therefore it was discontinued. Given this, he was felt to be a good candidate Watchman and is now s/p LAAO with Watchman FLX 27mm device performed 06/29/22.    Post procedure, he was started on ASA and Plavix with plans to continue DAPT through 6 months of therapy (12/29/22) as tolerated. Unfortunately it appears he was having trouble with anemia felt to be secondary to ASA therefore he was continued on Plavix monotherapy. TEE 08/11/22 showed well seated device with no leak or thrombus.   He was then seen by Dr. Myra Gianotti for LE claudication and underwent right popliteal, external and right common iliac  stenting 11/28/22. He has follow up with Dr. Myra Gianotti Monday. He will need to stay on Plavix given recent stenting.   Today he is here with his wife and reports he has been doing well with no chest pain, palpitations, LE edema, orthopnea, dizziness, or syncope. BP a little low today but he reports SBP runs in the 120's at home. Denies dizziness.  Past Medical History:  Diagnosis Date   Anemia    due to GIB, s/p transfusion   Anginal pain 07/10/2018   Arthritis    back pain, much worse after consecutive golf rounds   Cancer    thyroid   Chronic kidney disease    Colon polyps    Coronary artery disease    COVID-19    Dyspnea    walking up a hill   Dysrhythmia 2020   GERD (gastroesophageal reflux disease)    Hypercalcemia    h/o, resolved as of 2012, prev due to high amount of calcium intake   Hyperlipidemia    Hypertension    Hypothyroidism    IgG gammopathy    stable as of 2012 per [facility]   Pneumonia    as a child   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 Excell Seltzer  PVD (peripheral vascular disease)    L leg bypass, R leg stented    Past Surgical History:  Procedure Laterality Date    dental implant left bottom-did have bleeding      ABDOMINAL AORTOGRAM W/LOWER EXTREMITY Bilateral 06/26/2019   Procedure: ABDOMINAL AORTOGRAM W/LOWER EXTREMITY;  Surgeon: Runell Gess, MD;  [facility];  Service: Cardiovascular;  Laterality: Bilateral;   ABDOMINAL AORTOGRAM W/LOWER EXTREMITY N/A 06/08/2020   Procedure: ABDOMINAL AORTOGRAM W/LOWER EXTREMITY;  Surgeon: Nada Libman, MD;  [facility]: [facility];  Service: Cardiovascular;  Laterality: N/A;   ABDOMINAL AORTOGRAM W/LOWER EXTREMITY N/A 11/28/2022   Procedure: ABDOMINAL AORTOGRAM W/LOWER EXTREMITY;  Surgeon: Nada Libman, MD;  [facility]: [facility];  Service: Cardiovascular;  Laterality: N/A;   BYPASS GRAFT     L leg   CARDIAC  CATHETERIZATION  06/24/2018   COLONOSCOPY N/A 10/09/2021   Procedure: COLONOSCOPY;  Surgeon: Toledo, Boykin Nearing, MD;  Location: [facility] ENDOSCOPY;  Service: Gastroenterology;  Laterality: N/A;   COLONOSCOPY WITH PROPOFOL N/A 05/04/2015   Procedure: COLONOSCOPY WITH PROPOFOL;  Surgeon: Christena Deem, MD;  Location: [facility] ENDOSCOPY;  Service: Endoscopy;  Laterality: N/A;   CORONARY ARTERY BYPASS GRAFT N/A 07/11/2018   Procedure: CORONARY ARTERY BYPASS GRAFTING (CABG) x three, using left internal mammary artery and right leg greater saphenous vein harvested endoscopically;  Surgeon: Alleen Borne, MD;  Location: [facility];  Service: Open Heart Surgery;  Laterality: N/A;   ENDARTERECTOMY FEMORAL Right 02/13/2020   Procedure: RIGHT ENDARTERECTOMY FEMORAL WITH BOVINE PATCH;  Surgeon: Nada Libman, MD;  Location: [facility] OR;  Service: Vascular;  Laterality: Right;   ESOPHAGOGASTRODUODENOSCOPY N/A 10/09/2021   Procedure: ESOPHAGOGASTRODUODENOSCOPY (EGD);  Surgeon: Toledo, Boykin Nearing, MD;  Location: [facility] ENDOSCOPY;  Service: Gastroenterology;  Laterality: N/A;   GIVENS CAPSULE STUDY N/A 10/09/2021   Procedure: GIVENS CAPSULE STUDY;  Surgeon: Toledo, Boykin Nearing, MD;  Location: [facility] ENDOSCOPY;  Service: Gastroenterology;  Laterality: N/A;   LEFT ATRIAL APPENDAGE OCCLUSION N/A 06/29/2022   Procedure: LEFT ATRIAL APPENDAGE OCCLUSION;  Surgeon: Tonny Bollman, MD;  Location: [facility] INVASIVE CV LAB;  Service: Cardiovascular;  Laterality: N/A;   LEFT HEART CATH AND CORONARY ANGIOGRAPHY N/A 06/24/2018   Procedure: LEFT HEART CATH AND CORONARY ANGIOGRAPHY;  Surgeon: Runell Gess, MD;  [facility];  Service: Cardiovascular;  Laterality: N/A;   LOWER EXTREMITY ANGIOGRAPHY Bilateral 10/25/2021   Procedure: Lower Extremity Angiography;  Surgeon: Nada Libman, MD;  [facility];  Service: Cardiovascular;  Laterality: Bilateral;   PERIPHERAL VASCULAR ATHERECTOMY Right 06/08/2020   Procedure:  PERIPHERAL VASCULAR ATHERECTOMY;  Surgeon: Nada Libman, MD;  [facility];  Service: Cardiovascular;  Laterality: Right;  superficial femoral   PERIPHERAL VASCULAR BALLOON ANGIOPLASTY Right 06/08/2020   Procedure: PERIPHERAL VASCULAR BALLOON ANGIOPLASTY;  Surgeon: Nada Libman, MD;  [facility];  Service: Cardiovascular;  Laterality: Right;  External iliac   PERIPHERAL VASCULAR BALLOON ANGIOPLASTY Right 11/28/2022   Procedure: PERIPHERAL VASCULAR BALLOON ANGIOPLASTY;  Surgeon: Nada Libman, MD;  [facility];  Service: Cardiovascular;  Laterality: Right;  common illiac   PERIPHERAL VASCULAR INTERVENTION Right 10/25/2021   Procedure: PERIPHERAL VASCULAR INTERVENTION;  Surgeon: Nada Libman, MD;  [facility];  Service: Cardiovascular;  Laterality: Right;  SFA   PERIPHERAL VASCULAR INTERVENTION Right 11/28/2022   Procedure: PERIPHERAL VASCULAR INTERVENTION;  Surgeon: Nada Libman, MD;  [facility];  Service: Cardiovascular;  Laterality: Right;   RENAL ANGIOGRAPHY Bilateral 10/25/2021   Procedure: RENAL ANGIOGRAPHY;  Surgeon: Nada Libman, MD;  [facility];  Service: Cardiovascular;  Laterality: Bilateral;   TEE WITHOUT CARDIOVERSION N/A 07/11/2018   Procedure: TRANSESOPHAGEAL ECHOCARDIOGRAM (TEE);  Surgeon: Alleen Borne, MD;  Location: [facility] OR;  Service: Open Heart Surgery;  Laterality: N/A;   TEE WITHOUT CARDIOVERSION N/A 06/29/2022   Procedure: TRANSESOPHAGEAL ECHOCARDIOGRAM (TEE);  Surgeon: Tonny Bollman, MD;  [facility]: [facility] INVASIVE CV LAB;  Service: Cardiovascular;  Laterality: N/A;   TEE WITHOUT CARDIOVERSION N/A 08/11/2022   Procedure: TRANSESOPHAGEAL ECHOCARDIOGRAM (TEE);  Surgeon: Thurmon Fair, MD;  Location[facility];  Service: Cardiovascular;  Laterality: N/A;   THYROIDECTOMY, PARTIAL  2016   VASCULAR SURGERY      Current Medications: Current Meds  Medication  Sig   acetaminophen (TYLENOL) 500 MG tablet Take 1,000 mg by mouth every 6 (six) hours as needed for moderate pain or headache.   amLODipine (NORVASC) 10 MG tablet TAKE 1 TABLET BY MOUTH DAILY   Cholecalciferol (DIALYVITE VITAMIN D 5000 PO) Take 5,000 Units by mouth daily.   clopidogrel (PLAVIX) 75 MG tablet Take 1 tablet (75 mg total) by mouth daily.   ferrous sulfate 325 (65 FE) MG EC tablet Take 1 tablet (325 mg total) by mouth daily.   hydrochlorothiazide (HYDRODIURIL) 50 MG tablet Take 1 tablet by mouth daily.   hydrOXYzine (ATARAX) 10 MG tablet ONE-HALF TO ONE TABLET 3 TIMES DAILY AS NEEDED FOR ANXIETY   levothyroxine (SYNTHROID) 75 MCG tablet TAKE 1 TABLET BY MOUTH DAILY EXCEPT ON THURSDAYS TAKE 1.5 TABLETS   metoprolol tartrate (LOPRESSOR) 25 MG tablet Take 1 tablet (25 mg total) by mouth 2 (two) times daily.   Multiple Vitamins-Minerals (MULTIVITAMIN MEN 50+) TABS Take 1 tablet by mouth in the morning.   pantoprazole (PROTONIX) 40 MG tablet TAKE 1 TABLET BY MOUTH DAILY   pramipexole (MIRAPEX) 1 MG tablet Take 1 mg by mouth daily as needed (restless leg).   REPATHA SURECLICK 140 MG/ML SOAJ INJECT 140MG  EVERY 14 DAYS   tamsulosin (FLOMAX) 0.4 MG CAPS capsule Take 1 capsule (0.4 mg total) by mouth daily.     Allergies:   Penicillins, Ace inhibitors, Aspirin, Celebrex [celecoxib], and Lipitor [atorvastatin]   Social History   Socioeconomic History   Marital status: Married    Spouse name: Not on file   Number of children: Not on file   Years of education: Not on file   Highest education level: Not on file  Occupational History   Not on file  Tobacco Use   Smoking status: Former    Types: Cigarettes    Quit date: 09/04/2008    Years since quitting: 14.3    Passive exposure: Never   Smokeless tobacco: Former    Types: Chew   Tobacco comments:    HAs quit tobacco products 2009  Vaping Use   Vaping Use: Never used  Substance and Sexual Activity   Alcohol use: Yes    Comment:  occ   Drug use: No   Sexual activity: Yes  Other Topics Concern   Not on file  Social History Narrative   Married 50+ years, 2 kids   Runs [employer]   Likes to play golf   Social Determinants of Health   Financial Resource Strain: Low Risk  (12/25/2022)   Overall Financial Resource Strain (CARDIA)    Difficulty of Paying Living  Expenses: Not hard at all  Food Insecurity: No Food Insecurity (12/25/2022)   Hunger Vital Sign    Worried About Running Out of Food in the Last Year: Never true    Ran Out of Food in the Last Year: Never true  Transportation Needs: No Transportation Needs (12/25/2022)   PRAPARE - Administrator, Civil Service (Medical): No    Lack of Transportation (Non-Medical): No  Physical Activity: Insufficiently Active (12/25/2022)   Exercise Vital Sign    Days of Exercise per Week: 5 days    Minutes of Exercise per Session: 20 min  Stress: No Stress Concern Present (12/25/2022)   [employer] of Occupational Health - Occupational Stress Questionnaire    Feeling of Stress : Not at all  Social Connections: Moderately Integrated (12/25/2022)   Social Connection and Isolation Panel [NHANES]    Frequency of Communication with Friends and Family: More than three times a week    Frequency of Social Gatherings with Friends and Family: More than three times a week    Attends Religious Services: More than 4 times per year    Active Member of [employer] or Organizations: No    Attends Banker Meetings: Never    Marital Status: Married     Family History: The patient's family history includes Diabetes in his mother; Stroke in his father. There is no history of Colon cancer or Prostate cancer.  ROS:   Please see the history of present illness.    All other systems reviewed and are negative.  EKGs/Labs/Other Studies Reviewed:    The following studies were reviewed today:  TEE 08/11/22:   1. Left ventricular ejection fraction, by estimation,  is 60 to 65%. The  left ventricle has normal function. The left ventricle has no regional  wall motion abnormalities.   2. Right ventricular systolic function is normal. The right ventricular  size is normal.   3. Well seated left atrial appendage Watchman device without peri-device  leak or thrombus. No left atrial/left atrial appendage thrombus was  detected.   4. The mitral valve is normal in structure. Mild mitral valve  regurgitation. No evidence of mitral stenosis.   5. The aortic valve is tricuspid. There is mild calcification of the  aortic valve. There is moderate thickening of the aortic valve. Aortic  valve regurgitation is not visualized. Mild aortic valve stenosis. Aortic  valve mean gradient measures 14.0 mmHg.   Aortic valve Vmax measures 2.20 m/s.   6. There is Moderate (Grade III) protruding plaque involving the  descending aorta.   7. The inferior vena cava is normal in size with greater than 50%  respiratory variability, suggesting right atrial pressure of 3 mmHg.   8. There is a small secundum atrial septal defect with predominantly left  to right shunting across the atrial septum.   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    Procedures This Admission:  Transeptal Puncture Intra-procedural TEE which showed no LAA thrombus Left atrial appendage occlusive device placement on 06/29/22 by Dr. Excell Seltzer.    This study demonstrated:    Successful left atrial appendage occlusion with a 27 mm Watchman FLX device   Recommend: continue ASA/clopidogrel x 6 months as tolerated, TEE in 6 weeks   EKG:  EKG is not ordered today.    Recent Labs: 08/24/2022: ALT 12; Brain Natriuretic Peptide 109 10/23/2022: Platelets 200.0 11/28/2022: BUN  21; Creatinine, Ser 1.00; Hemoglobin 13.3; Potassium 4.0; Sodium 141  Recent Lipid Panel    Component Value  Date/Time   CHOL 139 10/23/2022 0936   CHOL 145 08/05/2021 0925   CHOL 119 04/27/2020 0000   CHOL 227 11/15/2012 0000   TRIG 200.0 (H) 10/23/2022 0936   TRIG 209 (A) 04/27/2020 0000   HDL 65.70 10/23/2022 0936   HDL 78 08/05/2021 0925   CHOLHDL 2 10/23/2022 0936   VLDL 40.0 10/23/2022 0936   LDLCALC 34 10/23/2022 0936   LDLCALC 53 08/05/2021 0925   LDLCALC 23 04/27/2020 0000   LDLDIRECT 41.0 12/30/2020 1128   Physical Exam:    VS:  BP (!) 104/54   Pulse 76   Ht 5\' 7"  (1.702 m)   Wt 157 lb (71.2 kg)   SpO2 98%   BMI 24.59 kg/m     Wt Readings from Last 3 Encounters:  12/27/22 157 lb (71.2 kg)  12/25/22 155 lb (70.3 kg)  11/28/22 150 lb (68 kg)    General: Well developed, well nourished, NAD Lungs:Clear to ausculation bilaterally. No wheezes, rales, or rhonchi. Breathing is unlabored. Cardiovascular: RRR with S1 S2. No murmurs Extremities: No edema.  Neuro: Alert and oriented. No focal deficits. No facial asymmetry. MAE spontaneously. Psych: Responds to questions appropriately with normal affect.    ASSESSMENT/PLAN:    Paroxsymal atrial fibrillation: s/p successful left atrial appendage occlusion with a 27 mm Watchman FLX device 06/29/22 with recommendations to continue ASA 81mg  QD and Plavix 75mg  QD as tolerated. Unfortunately he did not tolerate ASA and this was stopped. He has tolerated Plavix with no bleeding. Given recent peripheral stenting, he will continue Plavix as directed by Dr. Myra Gianotti. No longer requires dental SBE. Plan follow up with Dr. Mariah Milling. We will follow at one year.    PAD: s/p Left fem pop bypass and Right SFA with stenting with recent right popliteal, external and right common iliac stenting 11/28/22. He has follow up with Dr. Myra Gianotti Monday. He will need to stay on Plavix.   Aortic stenosis: Murmur heard on exam today with last echo indicating mild AS. Plan follow up with Dr. Mariah Milling in 4 months and follow with surveillance imaging of AS.   CAD s/p  CABG: Stable with no anginal symptoms. No changes today.    HTN: Stable with no changes needed today.    Anemia: Follows with hematology/oncology. Continue ferrous sulfate.    Medication Adjustments/Labs and Tests Ordered: Current medicines are reviewed at length with the patient today.  Concerns regarding medicines are outlined above.  No orders of the defined types were placed in this encounter.  No orders of the defined types were placed in this encounter.   Patient Instructions  Medication Instructions:  Your physician recommends that you continue on your current medications as directed. Please refer to the Current Medication list given to you today.  *If you need a refill on your cardiac medications before your next appointment, please call your pharmacy*   Lab Work: NONE If you have labs (blood work) drawn today and your tests are completely normal, you will receive your results only by: MyChart Message (if you have MyChart) OR A paper copy in the mail If you have any lab test that is abnormal or we need to change your treatment, we will call you to review the results.   Testing/Procedures: NONE   Follow-Up: At [facility], you and your health needs are our priority.  As part of our  continuing mission to provide you with exceptional heart care, we have created designated Provider Care Teams.  These Care Teams include your primary Cardiologist (physician) and Advanced Practice Providers (APPs -  Physician Assistants and Nurse Practitioners) who all work together to provide you with the care you need, when you need it.  We recommend signing up for the patient portal called "MyChart".  Sign up information is provided on this After Visit Summary.  MyChart is used to connect with patients for Virtual Visits (Telemedicine).  Patients are able to view lab/test results, encounter notes, upcoming appointments, etc.  Non-urgent messages can be sent to your provider as well.    To learn more about what you can do with MyChart, go to [email or web address].    Your next appointment:   4-6 month(s)  Provider:   You may see Nanetta Batty, MD or one of the following Advanced Practice Providers on your designated Care Team:   Nicolasa Ducking, NP Eula Listen, PA-C Cadence Fransico Michael, PA-C Charlsie Quest, NP   Signed, Georgie Chard, NP  12/27/2022 1:57 PM    [facility]

## 2022-12-27 ENCOUNTER — Ambulatory Visit: Payer: Medicare Other | Attending: Cardiology | Admitting: Cardiology

## 2022-12-27 VITALS — BP 104/54 | HR 76 | Ht 67.0 in | Wt 157.0 lb

## 2022-12-27 DIAGNOSIS — I350 Nonrheumatic aortic (valve) stenosis: Secondary | ICD-10-CM | POA: Diagnosis not present

## 2022-12-27 DIAGNOSIS — Z95818 Presence of other cardiac implants and grafts: Secondary | ICD-10-CM | POA: Diagnosis not present

## 2022-12-27 DIAGNOSIS — I739 Peripheral vascular disease, unspecified: Secondary | ICD-10-CM | POA: Insufficient documentation

## 2022-12-27 DIAGNOSIS — I480 Paroxysmal atrial fibrillation: Secondary | ICD-10-CM | POA: Diagnosis not present

## 2022-12-27 DIAGNOSIS — Z951 Presence of aortocoronary bypass graft: Secondary | ICD-10-CM | POA: Insufficient documentation

## 2022-12-27 DIAGNOSIS — I10 Essential (primary) hypertension: Secondary | ICD-10-CM | POA: Diagnosis not present

## 2022-12-27 NOTE — Patient Instructions (Signed)
Medication Instructions:  Your physician recommends that you continue on your current medications as directed. Please refer to the Current Medication list given to you today.  *If you need a refill on your cardiac medications before your next appointment, please call your pharmacy*   Lab Work: NONE If you have labs (blood work) drawn today and your tests are completely normal, you will receive your results only by: MyChart Message (if you have MyChart) OR A paper copy in the mail If you have any lab test that is abnormal or we need to change your treatment, we will call you to review the results.   Testing/Procedures: NONE   Follow-Up: At [facility] and your health needs are our priority.  As part of our continuing mission to provide you with exceptional heart care, we have created designated Provider Care Teams.  These Care Teams include your primary Cardiologist (physician) and Advanced Practice Providers (APPs -  Physician Assistants and Nurse Practitioners) who all work together to provide you with the care you need, when you need it.  We recommend signing up for the patient portal called "MyChart".  Sign up information is provided on this After Visit Summary.  MyChart is used to connect with patients for Virtual Visits (Telemedicine).  Patients are able to view lab/test results, encounter notes, upcoming appointments, etc.  Non-urgent messages can be sent to your provider as well.   To learn more about what you can do with MyChart, go to [email or web address].    Your next appointment:   4-6 month(s)  Provider:   You may see Nanetta Batty, MD or one of the following Advanced Practice Providers on your designated Care Team:   Nicolasa Ducking, NP Eula Listen, PA-C Cadence Fransico Michael, PA-C Charlsie Quest, NP

## 2022-12-28 DIAGNOSIS — L708 Other acne: Secondary | ICD-10-CM | POA: Diagnosis not present

## 2022-12-28 DIAGNOSIS — Z08 Encounter for follow-up examination after completed treatment for malignant neoplasm: Secondary | ICD-10-CM | POA: Diagnosis not present

## 2022-12-28 DIAGNOSIS — Z85828 Personal history of other malignant neoplasm of skin: Secondary | ICD-10-CM | POA: Diagnosis not present

## 2022-12-28 DIAGNOSIS — L578 Other skin changes due to chronic exposure to nonionizing radiation: Secondary | ICD-10-CM | POA: Diagnosis not present

## 2022-12-28 DIAGNOSIS — L570 Actinic keratosis: Secondary | ICD-10-CM | POA: Diagnosis not present

## 2023-01-01 ENCOUNTER — Encounter: Payer: Self-pay | Admitting: Surgery

## 2023-01-01 ENCOUNTER — Ambulatory Visit (INDEPENDENT_AMBULATORY_CARE_PROVIDER_SITE_OTHER)
Admission: RE | Admit: 2023-01-01 | Discharge: 2023-01-01 | Disposition: A | Discharge status: Home / Self Care | Payer: Medicare Other | Source: Ambulatory Visit | Attending: Surgery | Admitting: Surgery

## 2023-01-01 ENCOUNTER — Ambulatory Visit (INDEPENDENT_AMBULATORY_CARE_PROVIDER_SITE_OTHER): Payer: Medicare Other | Admitting: Surgery

## 2023-01-01 ENCOUNTER — Ambulatory Visit (HOSPITAL_COMMUNITY)
Admission: RE | Admit: 2023-01-01 | Discharge: 2023-01-01 | Disposition: A | Discharge status: Home / Self Care | Payer: Medicare Other | Source: Ambulatory Visit | Attending: Surgery | Admitting: Surgery

## 2023-01-01 VITALS — BP 180/77 | HR 65 | Temp 97.9°F | Resp 20 | Ht 67.0 in | Wt 153.0 lb

## 2023-01-01 DIAGNOSIS — I739 Peripheral vascular disease, unspecified: Secondary | ICD-10-CM | POA: Diagnosis not present

## 2023-01-01 DIAGNOSIS — I70213 Atherosclerosis of native arteries of extremities with intermittent claudication, bilateral legs: Secondary | ICD-10-CM

## 2023-01-01 LAB — VAS US ABI WITH/WO TBI

## 2023-01-01 NOTE — Progress Notes (Signed)
Vascular and Vein Specialist of [facility]  Patient name: Ryan Pittman MRN: [id number] DOB: 09-24-1940 Sex: male   REASON FOR VISIT:    Follow up  HISOTRY OF PRESENT ILLNESS:    Ryan Pittman is a 82 y.o. male who returns for follow-up of his recent intervention in the right leg for elevated velocities.  He has undergone the following procedures:  Left femoral to below-knee popliteal bypass graft by Dr. Wyn Quaker 02/13/2020: Right iliofemoral endarterectomy with bovine patch angioplasty (claudication 06/08/2020: Drug-coated balloon angioplasty right common and external iliac artery.  Atherectomy with DCB, right superficial femoral artery (claudication) 10/25/2021: Right superficial femoral artery stent (claudication) 11/28/2022: Stent right popliteal artery, right external iliac artery, balloon angioplasty right common iliac artery (claudication)  Patient has a history of hypertension which is medically managed.  He is on a statin for hypercholesterolemia.  He is status post CABG for coronary artery disease in 2019.  Recently had a Watchman device placed.  He is off anticoagulation he does have a history of GI bleed  PAST MEDICAL HISTORY:   Past Medical History:  Diagnosis Date   Anemia    due to GIB, s/p transfusion   Anginal pain (HCC) 07/10/2018   Arthritis    back pain, much worse after consecutive golf rounds   Cancer [facility])    thyroid   Chronic kidney disease    Colon polyps    Coronary artery disease    COVID-19    Dyspnea    walking up a hill   Dysrhythmia 2020   GERD (gastroesophageal reflux disease)    Hypercalcemia    h/o, resolved as of 2012, prev due to high amount of calcium intake   Hyperlipidemia    Hypertension    Hypothyroidism    IgG gammopathy    stable as of 2012 per [facility]   Pneumonia    as a child   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 Excell Seltzer   PVD  (peripheral vascular disease) (HCC)    L leg bypass, R leg stented     FAMILY HISTORY:   Family History  Problem Relation Age of Onset   Diabetes Mother    Stroke Father    Colon cancer Neg Hx    Prostate cancer Neg Hx     SOCIAL HISTORY:   Social History   Tobacco Use   Smoking status: Former    Types: Cigarettes    Quit date: 09/04/2008    Years since quitting: 14.3    Passive exposure: Never   Smokeless tobacco: Former    Types: Chew   Tobacco comments:    HAs quit tobacco products 2009  Substance Use Topics   Alcohol use: Yes    Comment: occ     ALLERGIES:   Allergies  Allergen Reactions   Penicillins Itching and Other (See Comments)    PATIENT HAS HAD A PCN REACTION WITH IMMEDIATE RASH, FACIAL/TONGUE/THROAT SWELLING, SOB, OR LIGHTHEADEDNESS WITH HYPOTENSION:  #  #  YES  #  #  Has patient had a PCN reaction causing severe rash involving mucus membranes or skin necrosis: No Has patient had a PCN reaction that required hospitalization: No Has patient had a PCN reaction occurring within the last 10 years: No If all of the above answers are "NO", then may proceed with Cephalosporin use.    Ace Inhibitors Cough   Aspirin Other (See Comments)    H/o GI bleed  Celebrex [Celecoxib] Other (See Comments)    GI bleed   Lipitor [Atorvastatin] Other (See Comments)    myalgias     CURRENT MEDICATIONS:   Current Outpatient Medications  Medication Sig Dispense Refill   acetaminophen (TYLENOL) 500 MG tablet Take 1,000 mg by mouth every 6 (six) hours as needed for moderate pain or headache.     amLODipine (NORVASC) 10 MG tablet TAKE 1 TABLET BY MOUTH DAILY 90 tablet 3   Cholecalciferol (DIALYVITE VITAMIN D 5000 PO) Take 5,000 Units by mouth daily.     clopidogrel (PLAVIX) 75 MG tablet Take 1 tablet (75 mg total) by mouth daily. 90 tablet 3   ferrous sulfate 325 (65 FE) MG EC tablet Take 1 tablet (325 mg total) by mouth daily. 90 tablet 1   hydrochlorothiazide  (HYDRODIURIL) 50 MG tablet Take 1 tablet by mouth daily.     hydrOXYzine (ATARAX) 10 MG tablet ONE-HALF TO ONE TABLET 3 TIMES DAILY AS NEEDED FOR ANXIETY 30 tablet 2   levothyroxine (SYNTHROID) 75 MCG tablet TAKE 1 TABLET BY MOUTH DAILY EXCEPT ON THURSDAYS TAKE 1.5 TABLETS 100 tablet 3   metoprolol tartrate (LOPRESSOR) 25 MG tablet Take 1 tablet (25 mg total) by mouth 2 (two) times daily. 180 tablet 3   Multiple Vitamins-Minerals (MULTIVITAMIN MEN 50+) TABS Take 1 tablet by mouth in the morning.     pantoprazole (PROTONIX) 40 MG tablet TAKE 1 TABLET BY MOUTH DAILY 90 tablet 2   pramipexole (MIRAPEX) 1 MG tablet Take 1 mg by mouth daily as needed (restless leg).     REPATHA SURECLICK 140 MG/ML SOAJ INJECT 140MG  EVERY 14 DAYS 2 mL 11   tamsulosin (FLOMAX) 0.4 MG CAPS capsule Take 1 capsule (0.4 mg total) by mouth daily. 90 capsule 3   losartan (COZAAR) 25 MG tablet Take 25 mg by mouth daily. (Patient not taking: Reported on 01/01/2023)     losartan (COZAAR) 50 MG tablet Held as of 08/24/22 (Patient not taking: Reported on 01/01/2023)     No current facility-administered medications for this visit.    REVIEW OF SYSTEMS:   [X]  denotes positive finding, [ ]  denotes negative finding Cardiac  Comments:  Chest pain or chest pressure:    Shortness of breath upon exertion:    Short of breath when lying flat:    Irregular heart rhythm:        Vascular    Pain in calf, thigh, or hip brought on by ambulation:    Pain in feet at night that wakes you up from your sleep:     Blood clot in your veins:    Leg swelling:         Pulmonary    Oxygen at home:    Productive cough:     Wheezing:         Neurologic    Sudden weakness in arms or legs:     Sudden numbness in arms or legs:     Sudden onset of difficulty speaking or slurred speech:    Temporary loss of vision in one eye:     Problems with dizziness:         Gastrointestinal    Blood in stool:     Vomited blood:         Genitourinary     Burning when urinating:     Blood in urine:        Psychiatric    Major depression:  Hematologic    Bleeding problems:    Problems with blood clotting too easily:        Skin    Rashes or ulcers:        Constitutional    Fever or chills:      PHYSICAL EXAM:   Vitals:   01/01/23 1219  BP: (!) 180/77  Pulse: 65  Resp: 20  Temp: 97.9 F (36.6 C)  SpO2: 96%  Weight: 153 lb (69.4 kg)  Height: 5\' 7"  (1.702 m)    GENERAL: The patient is a well-nourished male, in no acute distress. The vital signs are documented above. CARDIAC: There is a regular rate and rhythm.  PULMONARY: Non-labored respirations MUSCULOSKELETAL: There are no major deformities or cyanosis. NEUROLOGIC: No focal weakness or paresthesias are detected. SKIN: There are no ulcers or rashes noted. PSYCHIATRIC: The patient has a normal affect.  STUDIES:   I have reviewed the following: +-------+-----------+-----------+------------+------------+  ABI/TBIToday's ABIToday's TBIPrevious ABIPrevious TBI  +-------+-----------+-----------+------------+------------+  Right [place]         0.43       [place]          0.34          +-------+-----------+-----------+------------+------------+  Left           0.47                 0.44          +-------+-----------+-----------+------------+------------+  Right toe pressure: 74 Left toe pressure: 82  Right: 30-49% stenosis noted in the common femoral artery. 30-49% stenosis  noted in the deep femoral artery. Patent stent with no evidence of  stenosis in the superficial femoral artery and popliteal artery artery.   Left: 30-49% stenosis noted in the common femoral artery. Left Femoral  popliteal artery graft patent.   MEDICAL ISSUES:   PAD: The patient is recently status post repeat intervention to the right leg with additional stenting.  He does not have any open wounds.  Only mild disease remains.  I told him that he will need to stay on Plavix  indefinitely given his issues with recurrent stenosis in his SFA stents.  He will return in 6 months for follow-up ultrasound.    Charlena Cross, MD, FACS Vascular and Vein Specialists of [facility] [phone] Pager [phone]

## 2023-01-02 ENCOUNTER — Telehealth: Payer: Self-pay

## 2023-01-02 NOTE — Progress Notes (Signed)
Care Management & Coordination Services Pharmacy Team  Reason for Encounter: Appointment Reminder  Contacted patient to confirm telephone appointment with Al Corpus , PharmD on 01/05/23 at 10:00. Spoke with patient on 01/02/2023   Do you have any problems getting your medications? No  What is your top health concern you would like to discuss at your upcoming visit?  No concerns   Have you seen any other providers since your last visit with PCP? Yes- vascular surgery ,cardiology  Hospital visits:  11/28/22-[facility]- abdominal aortogram- no admission   Star Rating Drugs:  Medication:  Last Fill: Day Supply Losartan    In hold   Care Gaps: Annual wellness visit in last year? Yes   Al Corpus, PharmD notified  Burt Knack, [facility] Clinical Pharmacy Assistant [phone]

## 2023-01-04 DIAGNOSIS — R809 Proteinuria, unspecified: Secondary | ICD-10-CM | POA: Diagnosis not present

## 2023-01-04 DIAGNOSIS — D631 Anemia in chronic kidney disease: Secondary | ICD-10-CM | POA: Diagnosis not present

## 2023-01-04 DIAGNOSIS — N1831 Chronic kidney disease, stage 3a: Secondary | ICD-10-CM | POA: Diagnosis not present

## 2023-01-04 DIAGNOSIS — I10 Essential (primary) hypertension: Secondary | ICD-10-CM | POA: Diagnosis not present

## 2023-01-05 ENCOUNTER — Ambulatory Visit: Payer: BLUE CROSS/BLUE SHIELD | Admitting: Pharmacist

## 2023-01-05 ENCOUNTER — Other Ambulatory Visit: Payer: Self-pay | Admitting: Family Medicine

## 2023-01-05 NOTE — Progress Notes (Signed)
Care Management & Coordination Services Pharmacy Note  01/05/2023 Name:  Ryan Pittman MRN:  [id number] DOB:  Sep 15, 1940  Summary: F/U visit -HTN: pt is off amlodipine and losartan, started HCTZ 50 mg per pulmonology to address pedal edema; pt reports BP at home 140s/50s -CKD Stage 3a: pt may benefit from ACE/ARB, nephrology advised staying off losartan for now per last note  Recommendations/Changes made from today's visit: -No med changes; consider restarting ARB if BP remains > 140/90  Follow up plan: -Pharmacist follow up televisit scheduled for 3 months -LAB appt 01/23/23 (CBC, iron, BMP); Cardiology 04/30/23; Vascular 07/09/23    Subjective: Ryan Pittman is an 82 y.o. year old male who is a primary patient of Joaquim Nam, MD.  The care coordination team was consulted for assistance with disease management and care coordination needs.    Engaged with patient by telephone for follow up visit.  Recent office visits: 08/24/22 Dr Para March OV: anemia - HGB 8.6. Stop ASA and plavix for 3 doses. Restart Plavix monotherapy. Repeat CBC 1 week - HGB 9.0. Repeat CBC 1 week - HGB 10.1.  Recent consult visits: 01/04/23 Dr Cherylann Ratel (Nephrology): BP 146/66. Remain off losartan.   01/01/23 Dr Myra Gianotti (Vasc surg): stay on plavix indefinitely.  12/27/22 NP Julien Girt (Cardiology): f/u - continue plavix.   12/19/22 Dr Karna Christmas (Pulm): RLS - on requip, but ineffective. Start pramipexole 1 mg TID, f/u 3 months  11/20/22 PA Rhyne (Vasc surg): PAD - claudication sx. Set up aortogram  11/17/22 Dr Lonna Cobb (Urology): BPH - bladder scan stable  08/30/22 Dr Cherylann Ratel (Nephrology): f/u - CKD stable. Off losartan d/t low BP. Re-eval next visit.  08/24/22 PA Eveland (Vasc Surg): PAD - triage appt, bilateral LE pain, weakness, SOB.   07/31/22 NP Georgie Chard (Cardiology): f/u - continue DAPT. Rx azithromycin for dental SBE.  07/03/22 PA Clinton Gallant (Vasc surg): PAD - stable baseline. Continue  DAPT.  05/31/22 Dr Excell Seltzer (Cardiology): pre-op eval. Cannot tolerate Eliquis 2.5 mg. Start clopidogrel.   05/09/22 PA Eula Listen (Cardiology): f/u CAD - [facility] visits: 11/28/22 planned admission [facility]): aortogram  06/30/22 Admission - Watchman procedure   Objective:  Lab Results  Component Value Date   CREATININE 1.00 11/28/2022   BUN 21 11/28/2022   GFR 60.71 10/23/2022   EGFR 63 05/31/2022   GFRNONAA 59 (L) 08/08/2022   GFRAA >60 02/15/2020   NA 141 11/28/2022   K 4.0 11/28/2022   CALCIUM 9.3 10/23/2022   CO2 27 10/23/2022   GLUCOSE 107 (H) 11/28/2022    Lab Results  Component Value Date/Time   HGBA1C 5.2 10/23/2022 09:36 AM   HGBA1C 6.1 (H) 07/14/2019 08:30 AM   GFR 60.71 10/23/2022 09:36 AM   GFR 51.78 (L) 08/29/2022 08:06 AM    Last diabetic Eye exam: No results found for: "HMDIABEYEEXA"  Last diabetic Foot exam: No results found for: "HMDIABFOOTEX"   Lab Results  Component Value Date   CHOL 139 10/23/2022   HDL 65.70 10/23/2022   LDLCALC 34 10/23/2022   LDLDIRECT 41.0 12/30/2020   TRIG 200.0 (H) 10/23/2022   CHOLHDL 2 10/23/2022       Latest Ref Rng & Units 08/24/2022    3:47 PM 12/07/2021   12:12 PM 11/22/2021    5:53 PM  Hepatic Function  Total Protein 6.1 - 8.1 g/dL 6.1  6.7  6.2   Albumin 3.5 - 5.0 g/dL  4.2  3.8   AST 10 - 35 U/L 16  18  16   ALT 9 - 46 U/L 12  14  14    Alk Phosphatase 38 - 126 U/L  41  36   Total Bilirubin 0.2 - 1.2 mg/dL 0.3  0.1  0.4     Lab Results  Component Value Date/Time   TSH 2.246 08/25/2021 03:17 AM   TSH 1.42 12/30/2020 11:28 AM   TSH 4.04 04/08/2020 08:58 AM       Latest Ref Rng & Units 11/28/2022   11:29 AM 10/23/2022    9:36 AM 09/12/2022   10:02 AM  CBC  WBC 4.0 - 10.5 K/uL  12.1  12.0   Hemoglobin 13.0 - 17.0 g/dL 54.0  98.1  19.1   Hematocrit 39.0 - 52.0 % 39.0  40.4  31.1   Platelets 150.0 - 400.0 K/uL  200.0  286.0    Iron/TIBC/Ferritin/ %Sat    Component Value Date/Time   IRON  111 10/23/2022 0936   TIBC 375 02/06/2022 1049   FERRITIN 33.0 10/23/2022 0936   IRONPCTSAT 25 02/06/2022 1049    Lab Results  Component Value Date/Time   VITAMINB12 595 12/07/2021 12:12 PM   VITAMINB12 724 08/11/2019 06:57 PM    Clinical ASCVD: Yes  The ASCVD Risk score (Arnett DK, et al., 2019) failed to calculate for the following reasons:   The 2019 ASCVD risk score is only valid for ages 46 to 42    CHA2DS2/VAS Stroke Risk Points  Current as of 2 days ago (Wednesday)     4 >= 2 Points: High Risk  1 - 1.99 Points: Medium Risk  0 Points: Low Risk       12/25/2022    3:10 PM 12/22/2021    3:32 PM 12/21/2020   12:02 PM  Depression screen PHQ 2/9  Decreased Interest 0 0 0  Down, Depressed, Hopeless 0 0 0  PHQ - 2 Score 0 0 0  Altered sleeping   0  Tired, decreased energy   0  Change in appetite   0  Feeling bad or failure about yourself    0  Trouble concentrating   0  Moving slowly or fidgety/restless   0  Suicidal thoughts   0  PHQ-9 Score   0  Difficult doing work/chores   Not difficult at all     Social History   Tobacco Use  Smoking Status Former   Types: Cigarettes   Quit date: 09/04/2008   Years since quitting: 14.3   Passive exposure: Never  Smokeless Tobacco Former   Types: Chew  Tobacco Comments   HAs quit tobacco products 2009   BP Readings from Last 3 Encounters:  01/01/23 (!) 180/77  12/27/22 (!) 104/54  11/28/22 (!) 162/67   Pulse Readings from Last 3 Encounters:  01/01/23 65  12/27/22 76  11/28/22 75   Wt Readings from Last 3 Encounters:  01/01/23 153 lb (69.4 kg)  12/27/22 157 lb (71.2 kg)  12/25/22 155 lb (70.3 kg)   BMI Readings from Last 3 Encounters:  01/01/23 23.96 kg/m  12/27/22 24.59 kg/m  12/25/22 24.64 kg/m    Allergies  Allergen Reactions   Penicillins Itching and Other (See Comments)    PATIENT HAS HAD A PCN REACTION WITH IMMEDIATE RASH, FACIAL/TONGUE/THROAT SWELLING, SOB, OR LIGHTHEADEDNESS WITH HYPOTENSION:  #   #  YES  #  #  Has patient had a PCN reaction causing severe rash involving mucus membranes or skin necrosis: No Has patient had a PCN reaction that required  hospitalization: No Has patient had a PCN reaction occurring within the last 10 years: No If all of the above answers are "NO", then may proceed with Cephalosporin use.    Ace Inhibitors Cough   Aspirin Other (See Comments)    H/o GI bleed   Celebrex [Celecoxib] Other (See Comments)    GI bleed   Lipitor [Atorvastatin] Other (See Comments)    myalgias    Medications Reviewed Today     Reviewed by Kathyrn Sheriff, [facility] (Pharmacist) on 01/05/23 at 1059  Med List Status: <None>   Medication Order Taking? Sig Documenting Provider Last Dose Status Informant  acetaminophen (TYLENOL) 500 MG tablet [id number] Yes Take 1,000 mg by mouth every 6 (six) hours as needed for moderate pain or headache. [provider] Taking Active Self  Patient not taking:  Discontinued 01/05/23 1059 (Completed Course) Cholecalciferol (DIALYVITE VITAMIN D 5000 PO) [id number] Yes Take 5,000 Units by mouth daily. [provider] Taking Active Self  clopidogrel (PLAVIX) 75 MG tablet [id number] Yes Take 1 tablet (75 mg total) by mouth daily. Tonny Bollman, MD Taking Active Self  ferrous sulfate 325 (65 FE) MG EC tablet [id number] Yes Take 1 tablet (325 mg total) by mouth daily. Rickard Patience, MD Taking Active Self  hydrochlorothiazide (HYDRODIURIL) 50 MG tablet [id number] Yes Take 1 tablet by mouth daily. [provider] Taking Active   hydrOXYzine (ATARAX) 10 MG tablet [id number] Yes ONE-HALF TO ONE TABLET 3 TIMES DAILY AS NEEDED FOR ANXIETY Joaquim Nam, MD Taking Active Self  levothyroxine (SYNTHROID) 75 MCG tablet [id number] Yes TAKE 1 TABLET BY MOUTH DAILY EXCEPT ON THURSDAYS TAKE 1.5 TABLETS Joaquim Nam, MD Taking Active Self  metoprolol tartrate (LOPRESSOR) 25 MG tablet [id number] Yes Take 1 tablet (25 mg total) by mouth 2 (two) times  daily. Azalee Course, [name] Taking Active Self  Multiple Vitamins-Minerals (MULTIVITAMIN MEN 50+) TABS [id number] Yes Take 1 tablet by mouth in the morning. [provider] Taking Active Self  pantoprazole (PROTONIX) 40 MG tablet [id number] Yes TAKE 1 TABLET BY MOUTH DAILY Filbert Schilder, NP Taking Active   pramipexole (MIRAPEX) 1 MG tablet [id number] Yes Take 1 mg by mouth daily as needed (restless leg). [provider] Taking Active Self  REPATHA SURECLICK 140 MG/ML Ivory Broad [id number] Yes INJECT 140MG  EVERY 14 DAYS Runell Gess, MD Taking Active Self  tamsulosin [facility]) 0.4 MG CAPS capsule [id number] Yes Take 1 capsule (0.4 mg total) by mouth daily. Riki Altes, MD Taking Active Self            SDOH:  (Social Determinants of Health) assessments and interventions performed: No SDOH Interventions    Flowsheet Row Clinical Support from 12/25/2022 in [facility] at [place] Telephone from 07/05/2022 in [facility] Care Coordination Care Coordination from 05/17/2022 in [facility] Care Coordination Clinical Support from 12/22/2021 in [facility] at [facility] Chronic Care Management from 09/28/2021 in [facility] at [facility] Clinical Support from 12/21/2020 in [facility] [place]  SDOH Interventions        Food Insecurity Interventions Intervention Not Indicated -- Intervention Not Indicated Intervention Not Indicated -- --  Housing Interventions Intervention Not Indicated Intervention Not Indicated Intervention Not Indicated Intervention Not Indicated -- --  Transportation Interventions Intervention Not Indicated Intervention Not Indicated Intervention Not Indicated Intervention Not Indicated Intervention Not Indicated --  Utilities Interventions Intervention Not Indicated -- -- -- -- --  Alcohol Usage Interventions Intervention Not Indicated (Score  <7) -- -- -- -- --  Depression Interventions/Treatment  -- -- -- -- -- PHQ2-9 Score <4 Follow-up Not Indicated  Financial Strain Interventions Intervention Not Indicated -- -- Intervention Not Indicated Intervention Not Indicated --  Physical Activity Interventions Intervention Not Indicated, Patient Refused -- -- Intervention Not Indicated -- --  Stress Interventions Intervention Not Indicated -- -- Intervention Not Indicated -- --  Social Connections Interventions Intervention Not Indicated -- -- Intervention Not Indicated -- --       Medication Assistance: None required.  Patient affirms current coverage meets needs.  Medication Access: Within the past 30 days, how often has patient missed a dose of medication? 0 Is a pillbox or other method used to improve adherence? Yes  Factors that may affect medication adherence? no barriers identified Are meds synced by current pharmacy? No  Are meds delivered by current pharmacy? No  Does patient experience delays in picking up medications due to transportation concerns? No   [facility] Services Reviewed: Is patient disadvantaged to use [facility]?: Yes  Current Rx insurance plan: BCBS PDP Name and location of Current pharmacy:  [facility] - [place], [place] - [address] [place] [address] Phone: [phone] Fax: [phone]  [facility] - [place], [place] - [address] [place] [address] Phone: [phone] Fax: [phone]  [facility] Pharm[facility] services reviewed with patient today?: No  Patient requests to transfer care to [facility] Pharm[facility]?: No  Reason patient declined to change pharmacies: Disadvantaged due to insurance/mail order  Compliance/Adherence/Medication fill history: Care Gaps: None  Star-Rating Drugs: None   ASSESSMENT / PLAN  Hypertension / CKD Stage 3a (BP goal <140/90) -Not ideally controlled - pt is now off amlodipine and losartan; pt reports  amlodipine stopped per pulmonology to address pedal edema 2 weeks ago, HCTZ started at same time -Current home readings: SBP 140s/50s -Current treatment: HCTZ 50 mg daily - Appropriate, Query Effective,  Metoprolol tartrate 25 mg - 1 tab BID -Appropriate, Query Effective -Medications previously tried: amlodipine (edema); losartan (low BP) -Educated on BP goals and benefits of medications for prevention of heart attack, stroke and kidney damage; Daily salt intake goal < 2300 mg; Importance of home blood pressure monitoring; -Reviewed ideally pt would be on ACE/ARB for renal benefits, losartan previously stopped due to low BP (on 3 BP meds at the time); consider restarting losartan in future -Counseled to monitor BP at home daily; contact provider with BP >150/90 -Recommended to continue current medication  Hyperlipidemia: (LDL goal < 70) -Controlled - LDL 34 (10/2022) at goal; pt endorses compliance with current regimen; he reports Repatha is currently affordable; hx statin intolerance -Hx CAD (CABGx 5 2019), PAD s/p multiple stents. Not on aspirin anymore. -Follows with cardiology, vascular regularly -Current treatment: Repatha Sureclick 140 mg q14 days -Appropriate, Effective, Safe, Accessible Clopidogrel 75 mg daily - Appropriate, Effective, Safe, Accessible -Medications previously tried: aspirin (GI bleed), atorvastatin -Educated on Cholesterol goals;  benefits of aspirin given multiple stents and hx CABG -Reviewed affordability - pt reports no issues getting Repatha -Recommended to continue current medication  Atrial Fibrillation (Goal: prevent stroke and major bleeding) -Controlled  -CHADSVASC: 4; hx multiple GI bleed (most recent 10/2021); -Watchman procedure 06/30/22 -Current treatment: Metoprolol tartrate 25 mg BID -Appropriate, Effective, Safe, Accessible -Medications previously tried: amiodarone, digoxin, Eliquis -Recommended to continue current medication  Hx GI Bleed /  Diverticulosis (Goal: prevent recurrence) -Controlled -  pt is not always compliant with pantoprazole -GI Bleed 10/2021 on Eliquis/aspirin, decision made per cardiology to continue anticoagulation -Current treatment  Pantoprazole 40 mg daily - Appropriate, Effective, Safe, Accessible -Medications previously tried: omeprazole -Previously discussed benefits of PPI given hx of GI bleed and current Plavix; advised to continue daily PPI  Anemia  -Improving - HGB 13.3 (11/2022) improving off of aspirin; ferritin 33 (10/2022), pt is now off iron as he reports one of his providers told him to stop it -Medications previously tried: ferrous sulfate  Health Maintenance -Vaccine gaps: Flu, Shingrix, covid booster -Hx of Thyroid cancer, basal cell carcinoma -Hypothyroidism: TSH at goal on levothyroxine 75 mcg -RLS: pt is on pramipexole now (off ropinirole); notably now off iron as well  Al Corpus, PharmD, BCACP Clinical Pharmacist [facility] [phone]

## 2023-01-08 ENCOUNTER — Other Ambulatory Visit: Payer: Self-pay

## 2023-01-08 DIAGNOSIS — I70213 Atherosclerosis of native arteries of extremities with intermittent claudication, bilateral legs: Secondary | ICD-10-CM

## 2023-01-08 DIAGNOSIS — I739 Peripheral vascular disease, unspecified: Secondary | ICD-10-CM

## 2023-01-10 DIAGNOSIS — N189 Chronic kidney disease, unspecified: Secondary | ICD-10-CM | POA: Diagnosis not present

## 2023-01-10 DIAGNOSIS — D472 Monoclonal gammopathy: Secondary | ICD-10-CM | POA: Diagnosis not present

## 2023-01-10 DIAGNOSIS — C73 Malignant neoplasm of thyroid gland: Secondary | ICD-10-CM | POA: Diagnosis not present

## 2023-01-10 DIAGNOSIS — I2510 Atherosclerotic heart disease of native coronary artery without angina pectoris: Secondary | ICD-10-CM | POA: Diagnosis not present

## 2023-01-10 DIAGNOSIS — D631 Anemia in chronic kidney disease: Secondary | ICD-10-CM | POA: Diagnosis not present

## 2023-01-10 DIAGNOSIS — I4891 Unspecified atrial fibrillation: Secondary | ICD-10-CM | POA: Diagnosis not present

## 2023-01-10 DIAGNOSIS — Z951 Presence of aortocoronary bypass graft: Secondary | ICD-10-CM | POA: Diagnosis not present

## 2023-01-10 DIAGNOSIS — D72829 Elevated white blood cell count, unspecified: Secondary | ICD-10-CM | POA: Diagnosis not present

## 2023-01-10 DIAGNOSIS — I739 Peripheral vascular disease, unspecified: Secondary | ICD-10-CM | POA: Diagnosis not present

## 2023-01-15 ENCOUNTER — Other Ambulatory Visit: Payer: Self-pay | Admitting: Physician Assistant

## 2023-01-15 DIAGNOSIS — I10 Essential (primary) hypertension: Secondary | ICD-10-CM

## 2023-01-15 DIAGNOSIS — I359 Nonrheumatic aortic valve disorder, unspecified: Secondary | ICD-10-CM

## 2023-01-23 ENCOUNTER — Other Ambulatory Visit (INDEPENDENT_AMBULATORY_CARE_PROVIDER_SITE_OTHER): Payer: Medicare Other

## 2023-01-23 DIAGNOSIS — D649 Anemia, unspecified: Secondary | ICD-10-CM

## 2023-01-23 LAB — CBC WITH DIFFERENTIAL/PLATELET
Basophils Absolute: 0.1 10*3/uL (ref 0.0–0.1)
Basophils Relative: 1.3 % (ref 0.0–3.0)
Eosinophils Absolute: 0.1 10*3/uL (ref 0.0–0.7)
Eosinophils Relative: 1.4 % (ref 0.0–5.0)
HCT: 41.5 % (ref 39.0–52.0)
Hemoglobin: 13.5 g/dL (ref 13.0–17.0)
Lymphocytes Relative: 16.2 % (ref 12.0–46.0)
Lymphs Abs: 1.5 10*3/uL (ref 0.7–4.0)
MCHC: 32.6 g/dL (ref 30.0–36.0)
MCV: 91.1 fl (ref 78.0–100.0)
Monocytes Absolute: 0.7 10*3/uL (ref 0.1–1.0)
Monocytes Relative: 7.8 % (ref 3.0–12.0)
Neutro Abs: 6.7 10*3/uL (ref 1.4–7.7)
Neutrophils Relative %: 73.3 % (ref 43.0–77.0)
Platelets: 217 10*3/uL (ref 150.0–400.0)
RBC: 4.55 Mil/uL (ref 4.22–5.81)
RDW: 17.4 % — ABNORMAL HIGH (ref 11.5–15.5)
WBC: 9.2 10*3/uL (ref 4.0–10.5)

## 2023-01-23 LAB — BASIC METABOLIC PANEL
BUN: 33 mg/dL — ABNORMAL HIGH (ref 6–23)
CO2: 27 mEq/L (ref 19–32)
Calcium: 9.2 mg/dL (ref 8.4–10.5)
Chloride: 102 mEq/L (ref 96–112)
Creatinine, Ser: 1.43 mg/dL (ref 0.40–1.50)
GFR: 45.2 mL/min — ABNORMAL LOW (ref >60.00)
Glucose, Bld: 110 mg/dL — ABNORMAL HIGH (ref 70–99)
Potassium: 4.2 mEq/L (ref 3.5–5.1)
Sodium: 139 mEq/L (ref 135–145)

## 2023-01-23 LAB — IRON: Iron: 148 ug/dL (ref 42–165)

## 2023-01-23 LAB — FERRITIN: Ferritin: 29.5 ng/mL (ref 22.0–322.0)

## 2023-01-23 NOTE — Addendum Note (Signed)
Addended by: Alvina Chou on: 01/23/2023 08:15 AM   Modules accepted: Orders

## 2023-01-31 ENCOUNTER — Encounter: Payer: Self-pay | Admitting: Family Medicine

## 2023-02-06 ENCOUNTER — Ambulatory Visit (INDEPENDENT_AMBULATORY_CARE_PROVIDER_SITE_OTHER): Payer: Medicare Other | Admitting: Family Medicine

## 2023-02-06 ENCOUNTER — Encounter: Payer: Self-pay | Admitting: Family Medicine

## 2023-02-06 VITALS — BP 122/60 | HR 65 | Temp 97.3°F | Ht 67.0 in | Wt 152.0 lb

## 2023-02-06 DIAGNOSIS — R059 Cough, unspecified: Secondary | ICD-10-CM

## 2023-02-06 MED ORDER — DOXYCYCLINE HYCLATE 100 MG PO TABS
100.0000 mg | ORAL_TABLET | Freq: Two times a day (BID) | ORAL | 0 refills | Status: DC
Start: 2023-02-06 — End: 2023-04-05

## 2023-02-06 NOTE — Patient Instructions (Signed)
Rest, fluids, doxycycline- sunburn caution.  Update me as needed.  Take care.  Glad to see you.

## 2023-02-06 NOTE — Progress Notes (Unsigned)
Congestion and fatigue x almost 2 weeks. Patient states cough can be productive at times with yellow mucus. Has been taking nyquil. No known fevers. No vomiting, no black stools.  No facial pain.  No ear pain.  Minimal nasal congestion.    He is off iron in the meantime.    Meds, vitals, and allergies reviewed.   ROS: Per HPI unless specifically indicated in ROS section   GEN: nad, alert and oriented HEENT: mucous membranes moist, TM wnl B, sinuses not tender x4 NECK: supple w/o LA CV: rrr. SEM noted.  PULM: ctab, no inc wob, no focal decrease in breath sounds.T ABD: soft, +bs EXT: no edema SKIN: Well-perfused.

## 2023-02-08 NOTE — Assessment & Plan Note (Signed)
Presumed bronchitis.  Okay for outpatient follow-up.  Discussed options.  Would defer imaging at this point as it would likely not change the plan. Rest, fluids, doxycycline- sunburn caution.  Update me as needed.  He agrees to plan.

## 2023-03-05 DIAGNOSIS — D2261 Melanocytic nevi of right upper limb, including shoulder: Secondary | ICD-10-CM | POA: Diagnosis not present

## 2023-03-05 DIAGNOSIS — D2272 Melanocytic nevi of left lower limb, including hip: Secondary | ICD-10-CM | POA: Diagnosis not present

## 2023-03-05 DIAGNOSIS — Z85828 Personal history of other malignant neoplasm of skin: Secondary | ICD-10-CM | POA: Diagnosis not present

## 2023-03-05 DIAGNOSIS — D2262 Melanocytic nevi of left upper limb, including shoulder: Secondary | ICD-10-CM | POA: Diagnosis not present

## 2023-03-05 DIAGNOSIS — D225 Melanocytic nevi of trunk: Secondary | ICD-10-CM | POA: Diagnosis not present

## 2023-03-05 DIAGNOSIS — L570 Actinic keratosis: Secondary | ICD-10-CM | POA: Diagnosis not present

## 2023-03-13 DIAGNOSIS — R0609 Other forms of dyspnea: Secondary | ICD-10-CM | POA: Diagnosis not present

## 2023-03-23 ENCOUNTER — Encounter: Payer: Self-pay | Admitting: Pharmacist

## 2023-03-23 NOTE — Progress Notes (Signed)
Patient previously followed by [facility] pharmacist. Per clinical review, no pharmacist appointment needed at this time.

## 2023-03-29 ENCOUNTER — Ambulatory Visit
Admission: EM | Admit: 2023-03-29 | Discharge: 2023-03-29 | Disposition: A | Discharge status: Home / Self Care | Payer: Medicare Other | Attending: Emergency Medicine | Admitting: Emergency Medicine

## 2023-03-29 DIAGNOSIS — I10 Essential (primary) hypertension: Secondary | ICD-10-CM

## 2023-03-29 DIAGNOSIS — M79605 Pain in left leg: Secondary | ICD-10-CM | POA: Diagnosis not present

## 2023-03-29 NOTE — ED Provider Notes (Signed)
HPI  SUBJECTIVE:  Ryan Pittman is a 82 y.o. male who presents with shooting, nonradiating, left lower anterior leg pain that lasts seconds starting last night.  No calf pain, swelling, trauma to the area, color or temperature changes distally, bruising, erythema.  No chest pain, change in his baseline shortness of breath.  He played golf 3 days ago, and doubled the amount of steps that he usually takes, but did not have any pain after playing.  He tried Tylenol, last dose was within 6 hours of evaluation, without improvement in symptoms.  No aggravating factors.  The pain is not associated with ankle range of motion, dorsiflexion, plantarflexion, with walking.  Patient has a past medical history of COPD, stage III chronic kidney disease, peripheral arterial disease status post left femoropopliteal bypass and right SFA stenting, coronary artery disease status post CABG, hypertension, hypothyroidism, small bowel AVMs, paroxysmal atrial fibrillation, recurrent GI bleed, on Plavix, MGUS, asthma.  No history of PE, DVT.  PCP: [facility] [place] [facility].  Past Medical History:  Diagnosis Date   Anemia    due to GIB, s/p transfusion   Anginal pain (HCC) 07/10/2018   Arthritis    back pain, much worse after consecutive golf rounds   Cancer [facility])    thyroid   Chronic kidney disease    Colon polyps    Coronary artery disease    COVID-19    Dyspnea    walking up a hill   Dysrhythmia 2020   GERD (gastroesophageal reflux disease)    Hypercalcemia    h/o, resolved as of 2012, prev due to high amount of calcium intake   Hyperlipidemia    Hypertension    Hypothyroidism    IgG gammopathy    stable as of 2012 per [facility]   Pneumonia    as a child   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 Excell Seltzer   PVD (peripheral vascular disease) (HCC)    L leg bypass, R leg stented    Past Surgical History:  Procedure Laterality Date    dental implant left  bottom-did have bleeding      ABDOMINAL AORTOGRAM W/LOWER EXTREMITY Bilateral 06/26/2019   Procedure: ABDOMINAL AORTOGRAM W/LOWER EXTREMITY;  Surgeon: Runell Gess, MD;  Location: [facility];  Service: Cardiovascular;  Laterality: Bilateral;   ABDOMINAL AORTOGRAM W/LOWER EXTREMITY N/A 06/08/2020   Procedure: ABDOMINAL AORTOGRAM W/LOWER EXTREMITY;  Surgeon: Nada Libman, MD;  Location: [facility];  Service: Cardiovascular;  Laterality: N/A;   ABDOMINAL AORTOGRAM W/LOWER EXTREMITY N/A 11/28/2022   Procedure: ABDOMINAL AORTOGRAM W/LOWER EXTREMITY;  Surgeon: Nada Libman, MD;  Location: [facility];  Service: Cardiovascular;  Laterality: N/A;   BYPASS GRAFT     L leg   CARDIAC CATHETERIZATION  06/24/2018   COLONOSCOPY N/A 10/09/2021   Procedure: COLONOSCOPY;  Surgeon: Toledo, Boykin Nearing, MD;  Location: [facility] ENDOSCOPY;  Service: Gastroenterology;  Laterality: N/A;   COLONOSCOPY WITH PROPOFOL N/A 05/04/2015   Procedure: COLONOSCOPY WITH PROPOFOL;  Surgeon: Christena Deem, MD;  Location[facility] ENDOSCOPY;  Service: Endoscopy;  Laterality: N/A;   CORONARY ARTERY BYPASS GRAFT N/A 07/11/2018   Procedure: CORONARY ARTERY BYPASS GRAFTING (CABG) x three, using left internal mammary artery and right leg greater saphenous vein harvested endoscopically;  Surgeon: Alleen Borne, MD;  Location: [facility] OR;  Service: Open Heart Surgery;  Laterality: N/A;   ENDARTERECTOMY FEMORAL Right 02/13/2020   Procedure: RIGHT  ENDARTERECTOMY FEMORAL WITH BOVINE PATCH;  Surgeon: Nada Libman, MD;  Location: [facility] OR;  Service: Vascular;  Laterality: Right;   ESOPHAGOGASTRODUODENOSCOPY N/A 10/09/2021   Procedure: ESOPHAGOGASTRODUODENOSCOPY (EGD);  Surgeon: Toledo, Boykin Nearing, MD;  Location: [facility] ENDOSCOPY;  Service: Gastroenterology;  Laterality: N/A;   GIVENS CAPSULE STUDY N/A 10/09/2021   Procedure: GIVENS CAPSULE STUDY;  Surgeon: Toledo, Boykin Nearing, MD;  Location: [facility] ENDOSCOPY;  Service:  Gastroenterology;  Laterality: N/A;   LEFT ATRIAL APPENDAGE OCCLUSION N/A 06/29/2022   Procedure: LEFT ATRIAL APPENDAGE OCCLUSION;  Surgeon: Tonny Bollman, MD;  Location: [facility] INVASIVE CV LAB;  Service: Cardiovascular;  Laterality: N/A;   LEFT HEART CATH AND CORONARY ANGIOGRAPHY N/A 06/24/2018   Procedure: LEFT HEART CATH AND CORONARY ANGIOGRAPHY;  Surgeon: Runell Gess, MD;  Location: [facility];  Service: Cardiovascular;  Laterality: N/A;   LOWER EXTREMITY ANGIOGRAPHY Bilateral 10/25/2021   Procedure: Lower Extremity Angiography;  Surgeon: Nada Libman, MD;  [facility]: [facility];  Service: Cardiovascular;  Laterality: Bilateral;   PERIPHERAL VASCULAR ATHERECTOMY Right 06/08/2020   Procedure: PERIPHERAL VASCULAR ATHERECTOMY;  Surgeon: Nada Libman, MD;  [facility]: [facility];  Service: Cardiovascular;  Laterality: Right;  superficial femoral   PERIPHERAL VASCULAR BALLOON ANGIOPLASTY Right 06/08/2020   Procedure: PERIPHERAL VASCULAR BALLOON ANGIOPLASTY;  Surgeon: Nada Libman, MD;  [facility]: [facility];  Service: Cardiovascular;  Laterality: Right;  External iliac   PERIPHERAL VASCULAR BALLOON ANGIOPLASTY Right 11/28/2022   Procedure: PERIPHERAL VASCULAR BALLOON ANGIOPLASTY;  Surgeon: Nada Libman, MD;  [facility]: [facility];  Service: Cardiovascular;  Laterality: Right;  common illiac   PERIPHERAL VASCULAR INTERVENTION Right 10/25/2021   Procedure: PERIPHERAL VASCULAR INTERVENTION;  Surgeon: Nada Libman, MD;  [facility]: [facility];  Service: Cardiovascular;  Laterality: Right;  SFA   PERIPHERAL VASCULAR INTERVENTION Right 11/28/2022   Procedure: PERIPHERAL VASCULAR INTERVENTION;  Surgeon: Nada Libman, MD;  [facility]: [facility];  Service: Cardiovascular;  Laterality: Right;   RENAL ANGIOGRAPHY Bilateral 10/25/2021   Procedure: RENAL ANGIOGRAPHY;  Surgeon: Nada Libman, MD;  Location: [facility];  Service:  Cardiovascular;  Laterality: Bilateral;   TEE WITHOUT CARDIOVERSION N/A 07/11/2018   Procedure: TRANSESOPHAGEAL ECHOCARDIOGRAM (TEE);  Surgeon: Alleen Borne, MD;  Location: [facility] OR;  Service: Open Heart Surgery;  Laterality: N/A;   TEE WITHOUT CARDIOVERSION N/A 06/29/2022   Procedure: TRANSESOPHAGEAL ECHOCARDIOGRAM (TEE);  Surgeon: Tonny Bollman, MD;  Location: [facility] INVASIVE CV LAB;  Service: Cardiovascular;  Laterality: N/A;   TEE WITHOUT CARDIOVERSION N/A 08/11/2022   Procedure: TRANSESOPHAGEAL ECHOCARDIOGRAM (TEE);  Surgeon: Thurmon Fair, MD;  Location: [facility] ENDOSCOPY;  Service: Cardiovascular;  Laterality: N/A;   THYROIDECTOMY, PARTIAL  2016   VASCULAR SURGERY      Family History  Problem Relation Age of Onset   Diabetes Mother    Stroke Father    Colon cancer Neg Hx    Prostate cancer Neg Hx     Social History   Tobacco Use   Smoking status: Former    Current packs/day: 0.00    Types: Cigarettes    Quit date: 09/04/2008    Years since quitting: 14.5    Passive exposure: Never   Smokeless tobacco: Former    Types: Chew   Tobacco comments:    HAs quit tobacco products 2009  Vaping Use   Vaping status: Never Used  Substance Use Topics   Alcohol use: Yes  Comment: occ   Drug use: No    No current facility-administered medications for this encounter.  Current Outpatient Medications:    acetaminophen (TYLENOL) 500 MG tablet, Take 1,000 mg by mouth every 6 (six) hours as needed for moderate pain or headache., Disp: , Rfl:    Cholecalciferol (DIALYVITE VITAMIN D 5000 PO), Take 5,000 Units by mouth daily., Disp: , Rfl:    clopidogrel (PLAVIX) 75 MG tablet, Take 1 tablet (75 mg total) by mouth daily., Disp: 90 tablet, Rfl: 3   hydrochlorothiazide (HYDRODIURIL) 50 MG tablet, Take 1 tablet by mouth daily., Disp: , Rfl:    levothyroxine (SYNTHROID) 75 MCG tablet, TAKE 1 TABLET BY MOUTH 6 DAYS A WEEK AND1 & 1/2 TABLETS ONE DAY A WEEK AS DIRECTED., Disp: 100 tablet, Rfl: 3    metoprolol tartrate (LOPRESSOR) 25 MG tablet, TAKE 1 TABLET BY MOUTH TWICE DAILY, Disp: 180 tablet, Rfl: 3   Multiple Vitamins-Minerals (MULTIVITAMIN MEN 50+) TABS, Take 1 tablet by mouth in the morning., Disp: , Rfl:    pantoprazole (PROTONIX) 40 MG tablet, TAKE 1 TABLET BY MOUTH DAILY, Disp: 90 tablet, Rfl: 2   REPATHA SURECLICK 140 MG/ML SOAJ, INJECT 140MG  EVERY 14 DAYS, Disp: 2 mL, Rfl: 11   tamsulosin (FLOMAX) 0.4 MG CAPS capsule, Take 1 capsule (0.4 mg total) by mouth daily., Disp: 90 capsule, Rfl: 3   doxycycline (VIBRA-TABS) 100 MG tablet, Take 1 tablet (100 mg total) by mouth 2 (two) times daily., Disp: 14 tablet, Rfl: 0   hydrOXYzine (ATARAX) 10 MG tablet, ONE-HALF TO ONE TABLET 3 TIMES DAILY AS NEEDED FOR ANXIETY, Disp: 30 tablet, Rfl: 2  Allergies  Allergen Reactions   Penicillins Itching and Other (See Comments)    PATIENT HAS HAD A PCN REACTION WITH IMMEDIATE RASH, FACIAL/TONGUE/THROAT SWELLING, SOB, OR LIGHTHEADEDNESS WITH HYPOTENSION:  #  #  YES  #  #  Has patient had a PCN reaction causing severe rash involving mucus membranes or skin necrosis: No Has patient had a PCN reaction that required hospitalization: No Has patient had a PCN reaction occurring within the last 10 years: No If all of the above answers are "NO", then may proceed with Cephalosporin use.    Ace Inhibitors Cough   Aspirin Other (See Comments)    H/o GI bleed   Celebrex [Celecoxib] Other (See Comments)    GI bleed   Lipitor [Atorvastatin] Other (See Comments)    myalgias     ROS  As noted in HPI.   Physical Exam  BP (!) 183/71 (BP Location: Left Arm) Comment: Pt states he has white coat syndrome.  Pulse 60   Temp 98 F (36.7 C) (Oral)   Resp 18   SpO2 96%   BP Readings from Last 3 Encounters:  03/29/23 (!) 183/71  02/06/23 122/60  01/01/23 (!) 180/77     Constitutional: Well developed, well nourished, no acute distress Eyes:  EOMI, conjunctiva normal bilaterally HENT: Normocephalic,  atraumatic,mucus membranes moist Respiratory: Normal inspiratory effort Cardiovascular: Normal rate GI: nondistended skin: No rash, skin intact in the area of pain Musculoskeletal: calves symmetric, nontender, no edema.  No palpable cord bilaterally.  No tenderness along the left medial thigh.  No tenderness along the left anterior tibialis.  Some varicose veins, but no erythema, edema, cord, evidence of cellulitis or superficial thrombophlebitis.  Foot warm, pink.  PT 2+.  No pain with dorsiflexion/plantarflexion, ankle inversion/eversion.   Neurologic: Alert & oriented x 3, no focal neuro deficits Psychiatric: Speech and  behavior appropriate   ED Course   Medications - No data to display  No orders of the defined types were placed in this encounter.   No results found for this or any previous visit (from the past 24 hour(s)). No results found.  ED Clinical Impression  1. Anterior leg pain, left   2. Elevated blood pressure reading with diagnosis of hypertension      ED Assessment/Plan    Outside records reviewed.  Additional medical history obtained.  1.  Leg pain.  This does not appear to be a DVT, cellulitis or superficial thrombophlebitis. At lower risk for these as he is on Plavix.   It does not appear to be shinsplints.   I am unsure as to the etiology of his symptoms, but his foot is well-perfused, I do not see any evidence of a vascular emergency today.  Because this is less than 24 hours, will have him observe this and follow-up with his if not getting better in several days.  ER return precautions given.  2.  Elevated blood pressure with diagnosis of hypertension.  Patient otherwise asymptomatic.  States his blood pressure was 140/66 this morning.  He will keep an eye on this at home.  States his BP is always elevated when he sees a medical provider.  Discussed MDM, treatment plan, and plan for follow-up with patient. Discussed sn/sx that should prompt return to the  ED. patient agrees with plan.   No orders of the defined types were placed in this encounter.     *This clinic note was created using Dragon dictation software. Therefore, there may be occasional mistakes despite careful proofreading.  ?    Domenick Gong, MD 03/31/23 [phone]

## 2023-03-29 NOTE — ED Triage Notes (Signed)
Pt presents with pain in left calf that started yesterday. Feels like a stabbing pain that comes and goes. Took tylenol with little relief.

## 2023-03-29 NOTE — Discharge Instructions (Signed)
I do not see any evidence of a clot in your leg at this time.  You are at low risk for clot given that you are on Plavix.  Please follow-up with your doctor if your symptoms persist.  You can try heat or ice in the area, whichever feels better.  Go to the emergency department for chest pain, shortness of breath, redness in the area of pain or rash in the area of pain, if your calf becomes swollen or painful, or for other concerns  Also, keep an eye on your blood pressure. Return immediately to the ER if you start having chest pain, headache, problems seeing, problems talking, problems walking, if you feel like you're about to pass out, if you do pass out, if you have a seizure, or for any other concerns.  Go to www.goodrx.com  or www.costplusdrugs.com to look up your medications. This will give you a list of where you can find your prescriptions at the most affordable prices. Or ask the pharmacist what the cash price is, or if they have any other discount programs available to help make your medication more affordable. This can be less expensive than what you would pay with insurance.

## 2023-04-04 ENCOUNTER — Telehealth: Payer: Self-pay | Admitting: Family Medicine

## 2023-04-04 NOTE — Telephone Encounter (Signed)
Patient's wife called in stating patient has tested positive for covid. Wife was asking if there was anything Dr. Para March could send in for the patient to help with symptoms. Says the patient has a fever and some chest congestion, she worries because he has had some issues breathing in the past and spent some time in the hospital for it. Patient's wife does not want covid to be too hard on patient, wife is asking for a call back whenever possible.

## 2023-04-04 NOTE — Telephone Encounter (Signed)
[id number]

## 2023-04-04 NOTE — Telephone Encounter (Signed)
Dr. Para March is out of the office this week and not sure how often he is checking his messages. Patient needs an appt with another provider

## 2023-04-05 ENCOUNTER — Encounter: Payer: Self-pay | Admitting: Internal Medicine

## 2023-04-05 ENCOUNTER — Ambulatory Visit (INDEPENDENT_AMBULATORY_CARE_PROVIDER_SITE_OTHER): Payer: Medicare Other | Admitting: Internal Medicine

## 2023-04-05 ENCOUNTER — Encounter: Payer: Medicare Other | Admitting: Pharmacist

## 2023-04-05 ENCOUNTER — Telehealth: Payer: Medicare Other | Admitting: Family Medicine

## 2023-04-05 VITALS — BP 122/74 | HR 82 | Temp 97.8°F | Ht 67.0 in | Wt 153.0 lb

## 2023-04-05 DIAGNOSIS — U071 COVID-19: Secondary | ICD-10-CM | POA: Diagnosis not present

## 2023-04-05 NOTE — Progress Notes (Signed)
Subjective:    Patient ID: Ryan Pittman, male    DOB: [birth year], 82 y.o.   MRN: [id number]  HPI Here due to COVID infection With wife  Started with symptoms just over 2 days Cough mostly Feels "a little rough" Brief fever--101.5 was the highest. Did have a sweat but no chills No sig headache No sore throat or ear pain No SOB  Tested for COVID 2 days ago--positive  Current Outpatient Medications on File Prior to Visit  Medication Sig Dispense Refill   acetaminophen (TYLENOL) 500 MG tablet Take 1,000 mg by mouth every 6 (six) hours as needed for moderate pain or headache.     Cholecalciferol (DIALYVITE VITAMIN D 5000 PO) Take 5,000 Units by mouth daily.     clopidogrel (PLAVIX) 75 MG tablet Take 1 tablet (75 mg total) by mouth daily. 90 tablet 3   hydrochlorothiazide (HYDRODIURIL) 50 MG tablet Take 1 tablet by mouth daily.     hydrOXYzine (ATARAX) 10 MG tablet ONE-HALF TO ONE TABLET 3 TIMES DAILY AS NEEDED FOR ANXIETY 30 tablet 2   levothyroxine (SYNTHROID) 75 MCG tablet TAKE 1 TABLET BY MOUTH 6 DAYS A WEEK AND1 & 1/2 TABLETS ONE DAY A WEEK AS DIRECTED. 100 tablet 3   metoprolol tartrate (LOPRESSOR) 25 MG tablet TAKE 1 TABLET BY MOUTH TWICE DAILY 180 tablet 3   Multiple Vitamins-Minerals (MULTIVITAMIN MEN 50+) TABS Take 1 tablet by mouth in the morning.     pantoprazole (PROTONIX) 40 MG tablet TAKE 1 TABLET BY MOUTH DAILY 90 tablet 2   REPATHA SURECLICK 140 MG/ML SOAJ INJECT 140MG  EVERY 14 DAYS 2 mL 11   tamsulosin (FLOMAX) 0.4 MG CAPS capsule Take 1 capsule (0.4 mg total) by mouth daily. 90 capsule 3   No current facility-administered medications on file prior to visit.    Allergies  Allergen Reactions   Penicillins Itching and Other (See Comments)    PATIENT HAS HAD A PCN REACTION WITH IMMEDIATE RASH, FACIAL/TONGUE/THROAT SWELLING, SOB, OR LIGHTHEADEDNESS WITH HYPOTENSION:  #  #  YES  #  #  Has patient had a PCN reaction causing severe rash involving mucus membranes or  skin necrosis: No Has patient had a PCN reaction that required hospitalization: No Has patient had a PCN reaction occurring within the last 10 years: No If all of the above answers are "NO", then may proceed with Cephalosporin use.    Ace Inhibitors Cough   Aspirin Other (See Comments)    H/o GI bleed   Celebrex [Celecoxib] Other (See Comments)    GI bleed   Lipitor [Atorvastatin] Other (See Comments)    myalgias    Past Medical History:  Diagnosis Date   Anemia    due to GIB, s/p transfusion   Anginal pain (HCC) 07/10/2018   Arthritis    back pain, much worse after consecutive golf rounds   Cancer [facility])    thyroid   Chronic kidney disease    Colon polyps    Coronary artery disease    COVID-19    Dyspnea    walking up a hill   Dysrhythmia 2020   GERD (gastroesophageal reflux disease)    Hypercalcemia    h/o, resolved as of 2012, prev due to high amount of calcium intake   Hyperlipidemia    Hypertension    Hypothyroidism    IgG gammopathy    stable as of 2012 per [facility]   Pneumonia    as a child   Presence of Watchman  left atrial appendage closure device 06/29/2022   Watchman FLX 27mm with Dr. Excell Seltzer   PVD (peripheral vascular disease) (HCC)    L leg bypass, R leg stented    Past Surgical History:  Procedure Laterality Date    dental implant left bottom-did have bleeding      ABDOMINAL AORTOGRAM W/LOWER EXTREMITY Bilateral 06/26/2019   Procedure: ABDOMINAL AORTOGRAM W/LOWER EXTREMITY;  Surgeon: Runell Gess, MD;  Location: [facility];  Service: Cardiovascular;  Laterality: Bilateral;   ABDOMINAL AORTOGRAM W/LOWER EXTREMITY N/A 06/08/2020   Procedure: ABDOMINAL AORTOGRAM W/LOWER EXTREMITY;  Surgeon: Nada Libman, MD;  Location: [facility];  Service: Cardiovascular;  Laterality: N/A;   ABDOMINAL AORTOGRAM W/LOWER EXTREMITY N/A 11/28/2022   Procedure: ABDOMINAL AORTOGRAM W/LOWER EXTREMITY;  Surgeon: Nada Libman, MD;  Location: [facility];  Service: Cardiovascular;  Laterality: N/A;   BYPASS GRAFT     L leg   CARDIAC CATHETERIZATION  06/24/2018   COLONOSCOPY N/A 10/09/2021   Procedure: COLONOSCOPY;  Surgeon: Toledo, Boykin Nearing, MD;  Location: [facility] ENDOSCOPY;  Service: Gastroenterology;  Laterality: N/A;   COLONOSCOPY WITH PROPOFOL N/A 05/04/2015   Procedure: COLONOSCOPY WITH PROPOFOL;  Surgeon: Christena Deem, MD;  Location: [facility] ENDOSCOPY;  Service: Endoscopy;  Laterality: N/A;   CORONARY ARTERY BYPASS GRAFT N/A 07/11/2018   Procedure: CORONARY ARTERY BYPASS GRAFTING (CABG) x three, using left internal mammary artery and right leg greater saphenous vein harvested endoscopically;  Surgeon: Alleen Borne, MD;  Location: [facility] OR;  Service: Open Heart Surgery;  Laterality: N/A;   ENDARTERECTOMY FEMORAL Right 02/13/2020   Procedure: RIGHT ENDARTERECTOMY FEMORAL WITH BOVINE PATCH;  Surgeon: Nada Libman, MD;  Location: [facility] OR;  Service: Vascular;  Laterality: Right;   ESOPHAGOGASTRODUODENOSCOPY N/A 10/09/2021   Procedure: ESOPHAGOGASTRODUODENOSCOPY (EGD);  Surgeon: Toledo, Boykin Nearing, MD;  Location: [facility] ENDOSCOPY;  Service: Gastroenterology;  Laterality: N/A;   GIVENS CAPSULE STUDY N/A 10/09/2021   Procedure: GIVENS CAPSULE STUDY;  Surgeon: Toledo, Boykin Nearing, MD;  Location: [facility] ENDOSCOPY;  Service: Gastroenterology;  Laterality: N/A;   LEFT ATRIAL APPENDAGE OCCLUSION N/A 06/29/2022   Procedure: LEFT ATRIAL APPENDAGE OCCLUSION;  Surgeon: Tonny Bollman, MD;  Location[facility] INVASIVE CV LAB;  Service: Cardiovascular;  Laterality: N/A;   LEFT HEART CATH AND CORONARY ANGIOGRAPHY N/A 06/24/2018   Procedure: LEFT HEART CATH AND CORONARY ANGIOGRAPHY;  Surgeon: Runell Gess, MD;  [facility]: [facility];  Service: Cardiovascular;  Laterality: N/A;   LOWER EXTREMITY ANGIOGRAPHY Bilateral 10/25/2021   Procedure: Lower Extremity Angiography;  Surgeon: Nada Libman, MD;  [facility]: [facility];  Service: Cardiovascular;   Laterality: Bilateral;   PERIPHERAL VASCULAR ATHERECTOMY Right 06/08/2020   Procedure: PERIPHERAL VASCULAR ATHERECTOMY;  Surgeon: Nada Libman, MD;  [facility];  Service: Cardiovascular;  Laterality: Right;  superficial femoral   PERIPHERAL VASCULAR BALLOON ANGIOPLASTY Right 06/08/2020   Procedure: PERIPHERAL VASCULAR BALLOON ANGIOPLASTY;  Surgeon: Nada Libman, MD;  [facility];  Service: Cardiovascular;  Laterality: Right;  External iliac   PERIPHERAL VASCULAR BALLOON ANGIOPLASTY Right 11/28/2022   Procedure: PERIPHERAL VASCULAR BALLOON ANGIOPLASTY;  Surgeon: Nada Libman, MD;  [facility];  Service: Cardiovascular;  Laterality: Right;  common illiac   PERIPHERAL VASCULAR INTERVENTION Right 10/25/2021   Procedure: PERIPHERAL VASCULAR INTERVENTION;  Surgeon: Nada Libman, MD;  [facility]: [facility];  Service: Cardiovascular;  Laterality: Right;  SFA   PERIPHERAL  VASCULAR INTERVENTION Right 11/28/2022   Procedure: PERIPHERAL VASCULAR INTERVENTION;  Surgeon: Nada Libman, MD;  [facility];  Service: Cardiovascular;  Laterality: Right;   RENAL ANGIOGRAPHY Bilateral 10/25/2021   Procedure: RENAL ANGIOGRAPHY;  Surgeon: Nada Libman, MD;  [facility]: [facility];  Service: Cardiovascular;  Laterality: Bilateral;   TEE WITHOUT CARDIOVERSION N/A 07/11/2018   Procedure: TRANSESOPHAGEAL ECHOCARDIOGRAM (TEE);  Surgeon: Alleen Borne, MD;  Location: [facility] OR;  Service: Open Heart Surgery;  Laterality: N/A;   TEE WITHOUT CARDIOVERSION N/A 06/29/2022   Procedure: TRANSESOPHAGEAL ECHOCARDIOGRAM (TEE);  Surgeon: Tonny Bollman, MD;  Location: [facility] INVASIVE CV LAB;  Service: Cardiovascular;  Laterality: N/A;   TEE WITHOUT CARDIOVERSION N/A 08/11/2022   Procedure: TRANSESOPHAGEAL ECHOCARDIOGRAM (TEE);  Surgeon: Thurmon Fair, MD;  Location: [facility] ENDOSCOPY;  Service: Cardiovascular;  Laterality: N/A;   THYROIDECTOMY,  PARTIAL  2016   VASCULAR SURGERY      Family History  Problem Relation Age of Onset   Diabetes Mother    Stroke Father    Colon cancer Neg Hx    Prostate cancer Neg Hx     Social History   Socioeconomic History   Marital status: Married    Spouse name: Not on file   Number of children: Not on file   Years of education: Not on file   Highest education level: Not on file  Occupational History   Not on file  Tobacco Use   Smoking status: Former    Current packs/day: 0.00    Types: Cigarettes    Quit date: 09/04/2008    Years since quitting: 14.5    Passive exposure: Never   Smokeless tobacco: Former    Types: Chew   Tobacco comments:    HAs quit tobacco products 2009  Vaping Use   Vaping status: Never Used  Substance and Sexual Activity   Alcohol use: Yes    Comment: occ   Drug use: No   Sexual activity: Yes  Other Topics Concern   Not on file  Social History Narrative   Married 50+ years, 2 kids   Runs [employer]   Likes to play golf   Social Determinants of Health   Financial Resource Strain: Low Risk  (12/25/2022)   Overall Financial Resource Strain (CARDIA)    Difficulty of Paying Living Expenses: Not hard at all  Food Insecurity: No Food Insecurity (12/25/2022)   Hunger Vital Sign    Worried About Running Out of Food in the Last Year: Never true    Ran Out of Food in the Last Year: Never true  Transportation Needs: No Transportation Needs (12/25/2022)   PRAPARE - Administrator, Civil Service (Medical): No    Lack of Transportation (Non-Medical): No  Physical Activity: Insufficiently Active (12/25/2022)   Exercise Vital Sign    Days of Exercise per Week: 5 days    Minutes of Exercise per Session: 20 min  Stress: No Stress Concern Present (12/25/2022)   [employer] of Occupational Health - Occupational Stress Questionnaire    Feeling of Stress : Not at all  Social Connections: Moderately Integrated (12/25/2022)   Social Connection and  Isolation Panel [NHANES]    Frequency of Communication with Friends and Family: More than three times a week    Frequency of Social Gatherings with Friends and Family: More than three times a week    Attends Religious Services: More than 4 times per year    Active Member of  Clubs or Organizations: No    Attends Banker Meetings: Never    Marital Status: Married  Catering manager Violence: Not At Risk (12/25/2022)   Humiliation, Afraid, Rape, and Kick questionnaire    Fear of Current or Ex-Partner: No    Emotionally Abused: No    Physically Abused: No    Sexually Abused: No   Review of Systems Decreased taste--smell okay No N/V No abdominal pain Eating okay Didn't have last COVID vaccine    Objective:   Physical Exam Constitutional:      Appearance: Normal appearance.     Comments: Looks fine  HENT:     Head:     Comments: No sinus tenderness    Right Ear: Tympanic membrane and ear canal normal.     Left Ear: Tympanic membrane and ear canal normal.     Mouth/Throat:     Pharynx: No oropharyngeal exudate or posterior oropharyngeal erythema.  Pulmonary:     Effort: Pulmonary effort is normal.     Breath sounds: Normal breath sounds. No wheezing or rales.  Musculoskeletal:     Cervical back: Neck supple.  Lymphadenopathy:     Cervical: No cervical adenopathy.  Neurological:     Mental Status: He is alert.            Assessment & Plan:

## 2023-04-05 NOTE — Assessment & Plan Note (Signed)
Seems to have a mild infection and into 3rd day Wife feels he is improved from the first 2 days Discussed paxlovid----they will hold off. Discussed that we could reconsider this if he feels worse tomorrow. Needs tylenol regularly Plenty of rest Isolate till next week--then mask for a week ER if gets SOB

## 2023-04-28 NOTE — Progress Notes (Unsigned)
Cardiology Office Note  Date:  04/30/2023   ID:  Ryan Pittman, DOB Sep 13, 1940, MRN [id number]  PCP:  Joaquim Nam, MD   Chief Complaint  Patient presents with   4-6 month follow up     Patient c/o leg weakness with walking & chest indigestion. Medications reviewed by the patient verbally.     HPI:  Ryan Pittman is a 83 y.o. male with a hx of  CAD s/p CABG, hypertension, hyperlipidemia, hypothyroidism, IgG gammopathy and history of PAD.   tobacco abuse.   history of left femoral-popliteal bypass graft by Dr. Wyn Quaker in [place] several years ago.   Who presents office for routine follow-up hypertension, PAD, coronary disease.  Has been followed for quite some time by Dr. Allyson Sabal in [place]  LOV with myself 02/06/22 Seen by one of our providers July 28, 2023  Given guiac positive stools hospitalized in 10/2021 found to have a hemoglobin down to about 7 mg/dL. He received 2 units PRBCs with Hb improvement. 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   started on ASA and Plavix with plans to continue DAPT through 6 months of therapy (12/29/22) as tolerated. Unfortunately it appears he was having trouble with anemia felt to be secondary to ASA therefore he was continued on Plavix monotherapy. TEE 08/11/22 showed well seated device with no leak or thrombus.   for LE claudication and underwent right popliteal, external and right common iliac stenting 11/28/22.   More GERD sx, stopped his PPI  Labs reviewed A1C 5.2 Total chol 139, LDL 34   Home BP 140-150 systolic  in the Am Diastolic Low  EKG personally reviewed by myself on todays visit EKG Interpretation Date/Time:  Monday April 30 2023 10:29:59 EDT Ventricular Rate:  57 PR Interval:  154 QRS Duration:  84 QT Interval:  442 QTC Calculation: 430 R Axis:   36  Text  Interpretation: Sinus bradycardia with marked sinus arrhythmia When compared with ECG of 30-Jun-2022 06:28, No significant change was found Confirmed by Julien Nordmann [phone]) on 04/30/2023 10:50:06 AM   The past medical history reviewed positive stress test and a subsequent coronary CTA on 05/24/2018 that revealed elevated calcium score of 2858 with three-vessel calcification.  He ultimately underwent cardiac catheterization shortly after that revealed three-vessel disease with preserved EF.  He underwent CABG x5 by Dr. Laneta Simmers.  He was placed on Repatha for his lipid profile.    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  He ultimately underwent right femoral enterectomy and patch angioplasty by Dr. Myra Gianotti.    diagnosed with atrial fibrillation with RVR when he presented to [facility] with chest pain.  He was placed on Eliquis therapy.   2 weeks heart monitor obtained in January 2023 showed sinus rhythm with rare short episode of SVT, occasional PACs and PVCs , no atrial fibrillation.     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  It was felt nonbleeding vascular malformation could be a source of GI bleed.  right SFA stenting by Dr. Myra Gianotti on 10/25/2021.   Due to GI bleeding, his Plavix was discontinued and aspirin added.    Myoview on 11/18/2021 that showed EF 62%, no evidence of infarction or ischemia, overall low risk study.    Renal artery Doppler obtained in January 2023 demonstrated greater than 60% stenosis in the left renal artery.   70  to 99% stenosis in the SMA.  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   underwent overlapping stents of right SFA by Dr. Myra Gianotti on 10/25/2021.  No significant renal artery stenosis was identified at that time.    Due to progressive anemia, Eliquis was temporarily taken off in March 2023.  He is currently being evaluated by hematology and oncology service, who felt iron deficiency is likely related to GI bleed.  He previously has underwent multiple GI evaluation earlier this year.  Multiple myeloma panel was somewhat abnormal   12/13/2021 off of Eliquis.   Given the significant anemia recently, Eliquis was not restarted It was recommended he continue PPI, continue metoprolol underwent a bone marrow biopsy on 12/21/2021, this revealed hypercellular bone marrow with trilineage hematopoiesis.  Bone marrow did not show significant increase of plasma cell.  He has since been seen by [facility] oncology service on 01/10/2022.     PMH:   has a past medical history of Anemia, Anginal pain (HCC) (07/10/2018), Arthritis, Cancer (HCC), Chronic kidney disease, Colon polyps, Coronary artery disease, COVID-19, Dyspnea, Dysrhythmia (2020), GERD (gastroesophageal reflux disease), Hypercalcemia, Hyperlipidemia, Hypertension, Hypothyroidism, IgG gammopathy, Pneumonia, Presence of Watchman left atrial appendage closure device (06/29/2022), and PVD (peripheral vascular disease) (HCC).  PSH:    Past Surgical History:  Procedure Laterality Date    dental implant left bottom-did have bleeding      ABDOMINAL AORTOGRAM W/LOWER EXTREMITY Bilateral 06/26/2019   Procedure: ABDOMINAL AORTOGRAM W/LOWER EXTREMITY;  Surgeon: Runell Gess, MD;  [facility];  Service: Cardiovascular;  Laterality: Bilateral;   ABDOMINAL AORTOGRAM W/LOWER EXTREMITY N/A 06/08/2020   Procedure: ABDOMINAL AORTOGRAM W/LOWER EXTREMITY;  Surgeon: Nada Libman, MD;  [facility];   Service: Cardiovascular;  Laterality: N/A;   ABDOMINAL AORTOGRAM W/LOWER EXTREMITY N/A 11/28/2022   Procedure: ABDOMINAL AORTOGRAM W/LOWER EXTREMITY;  Surgeon: Nada Libman, MD;  [facility];  Service: Cardiovascular;  Laterality: N/A;   BYPASS GRAFT     L leg   CARDIAC CATHETERIZATION  06/24/2018   COLONOSCOPY N/A 10/09/2021   Procedure: COLONOSCOPY;  Surgeon: Toledo, Boykin Nearing, MD;  Location: [facility] ENDOSCOPY;  Service: Gastroenterology;  Laterality: N/A;   COLONOSCOPY WITH PROPOFOL N/A 05/04/2015   Procedure: COLONOSCOPY WITH PROPOFOL;  Surgeon: Christena Deem, MD;  Location: [facility] ENDOSCOPY;  Service: Endoscopy;  Laterality: N/A;   CORONARY ARTERY BYPASS GRAFT N/A 07/11/2018   Procedure: CORONARY ARTERY BYPASS GRAFTING (CABG) x three, using left internal mammary artery and right leg greater saphenous vein harvested endoscopically;  Surgeon: Alleen Borne, MD;  Location: [facility];  Service: Open Heart Surgery;  Laterality: N/A;   ENDARTERECTOMY FEMORAL Right 02/13/2020   Procedure: RIGHT ENDARTERECTOMY FEMORAL WITH BOVINE PATCH;  Surgeon: Nada Libman, MD;  [facility];  Service: Vascular;  Laterality: Right;   ESOPHAGOGASTRODUODENOSCOPY N/A 10/09/2021   Procedure: ESOPHAGOGASTRODUODENOSCOPY (EGD);  Surgeon: Toledo, Boykin Nearing, MD;  Location: [facility] ENDOSCOPY;  Service: Gastroenterology;  Laterality: N/A;   GIVENS CAPSULE STUDY N/A 10/09/2021   Procedure:  GIVENS CAPSULE STUDY;  Surgeon: Toledo, Boykin Nearing, MD;  Location: [facility] ENDOSCOPY;  Service: Gastroenterology;  Laterality: N/A;   LEFT ATRIAL APPENDAGE OCCLUSION N/A 06/29/2022   Procedure: LEFT ATRIAL APPENDAGE OCCLUSION;  Surgeon: Tonny Bollman, MD;  Location[facility] INVAS[facility] CV LAB;  Service: Cardiovascular;  Laterality: N/A;   LEFT HEART CATH AND CORONARY ANGIOGRAPHY N/A 06/24/2018   Procedure: LEFT HEART CATH AND CORONARY ANGIOGRAPHY;  Surgeon: Runell Gess, MD;  [facility];  Service: Cardiovascular;   Laterality: N/A;   LOWER EXTREMITY ANGIOGRAPHY Bilateral 10/25/2021   Procedure: Lower Extremity Angiography;  Surgeon: Nada Libman, MD;  [facility];  Service: Cardiovascular;  Laterality: Bilateral;   PERIPHERAL VASCULAR ATHERECTOMY Right 06/08/2020   Procedure: PERIPHERAL VASCULAR ATHERECTOMY;  Surgeon: Nada Libman, MD;  [facility];  Service: Cardiovascular;  Laterality: Right;  superficial femoral   PERIPHERAL VASCULAR BALLOON ANGIOPLASTY Right 06/08/2020   Procedure: PERIPHERAL VASCULAR BALLOON ANGIOPLASTY;  Surgeon: Nada Libman, MD;  [facility];  Service: Cardiovascular;  Laterality: Right;  External iliac   PERIPHERAL VASCULAR BALLOON ANGIOPLASTY Right 11/28/2022   Procedure: PERIPHERAL VASCULAR BALLOON ANGIOPLASTY;  Surgeon: Nada Libman, MD;  [facility];  Service: Cardiovascular;  Laterality: Right;  common illiac   PERIPHERAL VASCULAR INTERVENTION Right 10/25/2021   Procedure: PERIPHERAL VASCULAR INTERVENTION;  Surgeon: Nada Libman, MD;  [facility];  Service: Cardiovascular;  Laterality: Right;  SFA   PERIPHERAL VASCULAR INTERVENTION Right 11/28/2022   Procedure: PERIPHERAL VASCULAR INTERVENTION;  Surgeon: Nada Libman, MD;  [facility];  Service: Cardiovascular;  Laterality: Right;   RENAL ANGIOGRAPHY Bilateral 10/25/2021   Procedure: RENAL ANGIOGRAPHY;  Surgeon: Nada Libman, MD;  [facility];  Service: Cardiovascular;  Laterality: Bilateral;   TEE WITHOUT CARDIOVERSION N/A 07/11/2018   Procedure: TRANSESOPHAGEAL ECHOCARDIOGRAM (TEE);  Surgeon: Alleen Borne, MD;  Location: [facility] OR;  Service: Open Heart Surgery;  Laterality: N/A;   TEE WITHOUT CARDIOVERSION N/A 06/29/2022   Procedure: TRANSESOPHAGEAL ECHOCARDIOGRAM (TEE);  Surgeon: Tonny Bollman, MD;  Location: [facility] INVASIVE CV LAB;  Service: Cardiovascular;  Laterality: N/A;   TEE WITHOUT  CARDIOVERSION N/A 08/11/2022   Procedure: TRANSESOPHAGEAL ECHOCARDIOGRAM (TEE);  Surgeon: Thurmon Fair, MD;  [facility];  Service: Cardiovascular;  Laterality: N/A;   THYROIDECTOMY, PARTIAL  2016   VASCULAR SURGERY      Current Outpatient Medications  Medication Sig Dispense Refill   acetaminophen (TYLENOL) 500 MG tablet Take 1,000 mg by mouth every 6 (six) hours as needed for moderate pain or headache.     Cholecalciferol (DIALYVITE VITAMIN D 5000 PO) Take 5,000 Units by mouth daily.     clopidogrel (PLAVIX) 75 MG tablet Take 1 tablet (75 mg total) by mouth daily. 90 tablet 3   hydrochlorothiazide (HYDRODIURIL) 50 MG tablet Take 1 tablet by mouth daily.     hydrOXYzine (ATARAX) 10 MG tablet ONE-HALF TO ONE TABLET 3 TIMES DAILY AS NEEDED FOR ANXIETY 30 tablet 2   levothyroxine (SYNTHROID) 75 MCG tablet TAKE 1 TABLET BY MOUTH 6 DAYS A WEEK AND1 & 1/2 TABLETS ONE DAY A WEEK AS DIRECTED. 100 tablet 3   metoprolol tartrate (LOPRESSOR) 25 MG tablet TAKE 1 TABLET BY MOUTH TWICE DAILY 180 tablet 3   Multiple Vitamins-Minerals (MULTIVITAMIN MEN 50+) TABS Take 1 tablet by mouth in the morning.     REPATHA SURECLICK 140 MG/ML SOAJ  INJECT 140MG  EVERY 14 DAYS 2 mL 11   tamsulosin (FLOMAX) 0.4 MG CAPS capsule Take 1 capsule (0.4 mg total) by mouth daily. 90 capsule 3   pantoprazole (PROTONIX) 40 MG tablet Take 1 tablet (40 mg total) by mouth daily. 90 tablet 3   No current facility-administered medications for this visit.    Allergies:   Penicillins, Ace inhibitors, Aspirin, Celebrex [celecoxib], and Lipitor [atorvastatin]   Social History:  The patient  reports that he quit smoking about 14 years ago. His smoking use included cigarettes. He has never been exposed to tobacco smoke. He has quit using smokeless tobacco.  His smokeless tobacco use included chew. He reports current alcohol use. He reports that he does not use drugs.   Family History:   family history includes Diabetes in his  mother; Stroke in his father.    Review of Systems: Review of Systems  Constitutional: Negative.   HENT: Negative.    Respiratory: Negative.    Cardiovascular: Negative.   Gastrointestinal: Negative.   Musculoskeletal: Negative.   Neurological: Negative.   Psychiatric/Behavioral: Negative.    All other systems reviewed and are negative.  PHYSICAL EXAM: VS:  BP (!) 158/60 (BP Location: Left Arm, Patient Position: Sitting, Cuff Size: Normal)   Pulse (!) 57   Ht 5\' 7"  (1.702 m)   Wt 151 lb (68.5 kg)   SpO2 98%   BMI 23.65 kg/m  , BMI Body mass index is 23.65 kg/m. Constitutional:  oriented to person, place, and time. No distress.  HENT:  Head: Grossly normal Eyes:  no discharge. No scleral icterus.  Neck: No JVD, no carotid bruits  Cardiovascular: Regular rate and rhythm, no murmurs appreciated Pulmonary/Chest: Clear to auscultation bilaterally, no wheezes or rails Abdominal: Soft.  no distension.  no tenderness.  Musculoskeletal: Normal range of motion Neurological:  normal muscle tone. Coordination normal. No atrophy Skin: Skin warm and dry Psychiatric: normal affect, pleasant  Recent Labs: 08/24/2022: ALT 12; Brain Natriuretic Peptide 109 01/23/2023: BUN 33; Creatinine, Ser 1.43; Hemoglobin 13.5; Platelets 217.0; Potassium 4.2; Sodium 139    Lipid Panel Lab Results  Component Value Date   CHOL 139 10/23/2022   HDL 65.70 10/23/2022   LDLCALC 34 10/23/2022   TRIG 200.0 (H) 10/23/2022    Wt Readings from Last 3 Encounters:  04/30/23 151 lb (68.5 kg)  04/05/23 153 lb (69.4 kg)  02/06/23 152 lb (68.9 kg)     ASSESSMENT AND PLAN:  Problem List Items Addressed This Visit       Cardiology Problems   PAF (paroxysmal atrial fibrillation) (HCC)   Relevant Orders   EKG 12-Lead (Completed)   CAD (coronary artery disease)   Relevant Orders   EKG 12-Lead (Completed)   PAD (peripheral artery disease) (HCC) - Primary   Relevant Orders   EKG 12-Lead (Completed)    PVD (peripheral vascular disease) (HCC)   Relevant Orders   EKG 12-Lead (Completed)     Other   Presence of Watchman left atrial appendage closure device   Other Visit Diagnoses     Claudication (HCC)       Essential hypertension       S/P CABG (coronary artery bypass graft)       Relevant Orders   EKG 12-Lead (Completed)       CAD with stable angina Currently with no symptoms of angina. No further workup at this time. Continue current medication regimen. Non-smoker, cholesterol at goal  PAD Recent lower extremity intervention by vascular  February 2023 Tolerating Plavix, denies claudication symptoms  Hyperlipidemia On PCSK9 inhibitor Cholesterol at goal  Essential hypertension Blood pressure continues to run high, amlodipine added to his list Taking HCTZ, metoprolol, tamsulosin Recommend he closely monitor blood pressure late morning and call us with numbers for further medication titration  Paroxysmal atrial fibrillation Off Eliquis 2.5 twice daily S/p watchman device, On Plavix   Total encounter time more than 40 minutes  Greater than 50% was spent in counseling and coordination of care with the patient    Signed, Dossie Arbour, M.D., Ph.D. [facility] [place], [place] [phone]

## 2023-04-30 ENCOUNTER — Encounter: Payer: Self-pay | Admitting: Cardiovascular Disease

## 2023-04-30 ENCOUNTER — Ambulatory Visit: Payer: Medicare Other | Attending: Cardiovascular Disease | Admitting: Cardiovascular Disease

## 2023-04-30 VITALS — BP 158/60 | HR 57 | Ht 67.0 in | Wt 151.0 lb

## 2023-04-30 DIAGNOSIS — Z951 Presence of aortocoronary bypass graft: Secondary | ICD-10-CM | POA: Insufficient documentation

## 2023-04-30 DIAGNOSIS — I480 Paroxysmal atrial fibrillation: Secondary | ICD-10-CM | POA: Insufficient documentation

## 2023-04-30 DIAGNOSIS — I25118 Atherosclerotic heart disease of native coronary artery with other forms of angina pectoris: Secondary | ICD-10-CM | POA: Insufficient documentation

## 2023-04-30 DIAGNOSIS — Z95818 Presence of other cardiac implants and grafts: Secondary | ICD-10-CM | POA: Insufficient documentation

## 2023-04-30 DIAGNOSIS — I10 Essential (primary) hypertension: Secondary | ICD-10-CM | POA: Diagnosis not present

## 2023-04-30 DIAGNOSIS — I739 Peripheral vascular disease, unspecified: Secondary | ICD-10-CM | POA: Diagnosis not present

## 2023-04-30 MED ORDER — PANTOPRAZOLE SODIUM 40 MG PO TBEC: 40.0000 mg | DELAYED_RELEASE_TABLET | Freq: Every day | ORAL | 3 refills | Status: DC

## 2023-04-30 NOTE — Patient Instructions (Addendum)
Monitor blood pressure,  Call with numbers from late morning   Medication Instructions:  No changes  If you need a refill on your cardiac medications before your next appointment, please call your pharmacy.   Lab work: No new labs needed  Testing/Procedures: No new testing needed  Follow-Up: At [facility], you and your health needs are our priority.  As part of our continuing mission to provide you with exceptional heart care, we have created designated Provider Care Teams.  These Care Teams include your primary Cardiologist (physician) and Advanced Practice Providers (APPs -  Physician Assistants and Nurse Practitioners) who all work together to provide you with the care you need, when you need it.  You will need a follow up appointment in 12 months  Providers on your designated Care Team:   Nicolasa Ducking, NP Eula Listen, PA-C Cadence Fransico [place], [place]  COVID-19 Vaccine Information can be found at: [email or web address] For questions related to vaccine distribution or appointments, please email [email or web address] or call [phone].

## 2023-05-16 ENCOUNTER — Other Ambulatory Visit: Payer: Self-pay | Admitting: Family Medicine

## 2023-05-21 DIAGNOSIS — R809 Proteinuria, unspecified: Secondary | ICD-10-CM | POA: Diagnosis not present

## 2023-05-21 DIAGNOSIS — D631 Anemia in chronic kidney disease: Secondary | ICD-10-CM | POA: Diagnosis not present

## 2023-05-21 DIAGNOSIS — N1832 Chronic kidney disease, stage 3b: Secondary | ICD-10-CM | POA: Diagnosis not present

## 2023-05-21 DIAGNOSIS — I10 Essential (primary) hypertension: Secondary | ICD-10-CM | POA: Diagnosis not present

## 2023-06-19 ENCOUNTER — Other Ambulatory Visit: Payer: Self-pay | Admitting: Cardiovascular Disease

## 2023-07-09 ENCOUNTER — Ambulatory Visit (HOSPITAL_COMMUNITY)
Admission: RE | Admit: 2023-07-09 | Discharge: 2023-07-09 | Disposition: A | Discharge status: Home / Self Care | Payer: Medicare Other | Source: Ambulatory Visit | Attending: Surgery | Admitting: Surgery

## 2023-07-09 ENCOUNTER — Ambulatory Visit (INDEPENDENT_AMBULATORY_CARE_PROVIDER_SITE_OTHER): Payer: Medicare Other | Admitting: Surgery

## 2023-07-09 ENCOUNTER — Ambulatory Visit (INDEPENDENT_AMBULATORY_CARE_PROVIDER_SITE_OTHER)
Admission: RE | Admit: 2023-07-09 | Discharge: 2023-07-09 | Disposition: A | Discharge status: Home / Self Care | Payer: Medicare Other | Source: Ambulatory Visit | Attending: Surgery | Admitting: Surgery

## 2023-07-09 ENCOUNTER — Encounter: Payer: Self-pay | Admitting: Surgery

## 2023-07-09 VITALS — BP 145/63 | HR 58 | Temp 97.9°F | Resp 20 | Ht 67.0 in | Wt 150.0 lb

## 2023-07-09 DIAGNOSIS — I739 Peripheral vascular disease, unspecified: Secondary | ICD-10-CM

## 2023-07-09 DIAGNOSIS — I70213 Atherosclerosis of native arteries of extremities with intermittent claudication, bilateral legs: Secondary | ICD-10-CM | POA: Diagnosis not present

## 2023-07-09 LAB — VAS US ABI WITH/WO TBI

## 2023-07-09 NOTE — Progress Notes (Signed)
[facility]  Patient name: Ryan Pittman MRN: [id number] DOB: 27-May-1941 Sex: male   REASON FOR VISIT:    Follow-up  HISOTRY OF PRESENT ILLNESS:    Ryan Pittman is a 82 y.o. male here today for follow-up.  He has undergone the following procedures:  Left femoral to below-knee popliteal bypass graft by Dr. Wyn Quaker 02/13/2020: Right iliofemoral endarterectomy with bovine patch angioplasty (claudication 06/08/2020: Drug-coated balloon angioplasty right common and external iliac artery.  Atherectomy with DCB, right superficial femoral artery (claudication) 10/25/2021: Right superficial femoral artery stent (claudication) 11/28/2022: Stent right popliteal artery, right external iliac artery, balloon angioplasty right common iliac artery (claudication)   He will occasionally get leg weakness with ambulation.  This is somewhat unpredictable   Patient has a history of hypertension which is medically managed.  He is on a statin for hypercholesterolemia.  He is status post CABG for coronary artery disease in 2019.  Recently had a Watchman device placed.  He is off anticoagulation he does have a history of GI bleed    PAST MEDICAL HISTORY:   Past Medical History:  Diagnosis Date   Anemia    due to GIB, s/p transfusion   Anginal pain (HCC) 07/10/2018   Arthritis    back pain, much worse after consecutive golf rounds   Cancer [facility])    thyroid   Chronic kidney disease    Colon polyps    Coronary artery disease    COVID-19    Dyspnea    walking up a hill   Dysrhythmia 2020   GERD (gastroesophageal reflux disease)    Hypercalcemia    h/o, resolved as of 2012, prev due to high amount of calcium intake   Hyperlipidemia    Hypertension    Hypothyroidism    IgG gammopathy    stable as of 2012 per [facility]   Pneumonia    as a child   Presence of Watchman left atrial appendage closure device 06/29/2022   Watchman FLX 27mm  with Dr. Excell Seltzer   PVD (peripheral vascular disease) (HCC)    L leg bypass, R leg stented     FAMILY HISTORY:   Family History  Problem Relation Age of Onset   Diabetes Mother    Stroke Father    Colon cancer Neg Hx    Prostate cancer Neg Hx     SOCIAL HISTORY:   Social History   Tobacco Use   Smoking status: Former    Current packs/day: 0.00    Types: Cigarettes    Quit date: 09/04/2008    Years since quitting: 14.8    Passive exposure: Never   Smokeless tobacco: Former    Types: Chew   Tobacco comments:    HAs quit tobacco products 2009  Substance Use Topics   Alcohol use: Yes    Comment: occ     ALLERGIES:   Allergies  Allergen Reactions   Penicillins Itching and Other (See Comments)    PATIENT HAS HAD A PCN REACTION WITH IMMEDIATE RASH, FACIAL/TONGUE/THROAT SWELLING, SOB, OR LIGHTHEADEDNESS WITH HYPOTENSION:  #  #  YES  #  #  Has patient had a PCN reaction causing severe rash involving mucus membranes or skin necrosis: No Has patient had a PCN reaction that required hospitalization: No Has patient had a PCN reaction occurring within the last 10 years: No If all of the above answers are "NO", then may proceed with Cephalosporin use.    Ace Inhibitors Cough  Aspirin Other (See Comments)    H/o GI bleed   Celebrex [Celecoxib] Other (See Comments)    GI bleed   Lipitor [Atorvastatin] Other (See Comments)    myalgias     CURRENT MEDICATIONS:   Current Outpatient Medications  Medication Sig Dispense Refill   acetaminophen (TYLENOL) 500 MG tablet Take 1,000 mg by mouth every 6 (six) hours as needed for moderate pain or headache.     amLODipine (NORVASC) 10 MG tablet Take 1 tablet (10 mg total) by mouth daily.     Cholecalciferol (DIALYVITE VITAMIN D 5000 PO) Take 5,000 Units by mouth daily.     clopidogrel (PLAVIX) 75 MG tablet TAKE ONE TABLET BY MOUTH DAILY 90 tablet 3   hydrochlorothiazide (HYDRODIURIL) 50 MG tablet Take 1 tablet by mouth daily.      hydrOXYzine (ATARAX) 10 MG tablet ONE-HALF TO ONE TABLET 3 TIMES DAILY AS NEEDED FOR ANXIETY 30 tablet 2   levothyroxine (SYNTHROID) 75 MCG tablet TAKE 1 TABLET BY MOUTH 6 DAYS A WEEK AND1 & 1/2 TABLETS ONE DAY A WEEK AS DIRECTED. 100 tablet 3   metoprolol tartrate (LOPRESSOR) 25 MG tablet TAKE 1 TABLET BY MOUTH TWICE DAILY 180 tablet 3   Multiple Vitamins-Minerals (MULTIVITAMIN MEN 50+) TABS Take 1 tablet by mouth in the morning.     pantoprazole (PROTONIX) 40 MG tablet Take 1 tablet (40 mg total) by mouth daily. 90 tablet 3   REPATHA SURECLICK 140 MG/ML SOAJ INJECT 140MG  EVERY 14 DAYS 2 mL 11   tamsulosin (FLOMAX) 0.4 MG CAPS capsule Take 1 capsule (0.4 mg total) by mouth daily. 90 capsule 3   No current facility-administered medications for this visit.    REVIEW OF SYSTEMS:   [X]  denotes positive finding, [ ]  denotes negative finding Cardiac  Comments:  Chest pain or chest pressure:    Shortness of breath upon exertion:    Short of breath when lying flat:    Irregular heart rhythm:        Vascular    Pain in calf, thigh, or hip brought on by ambulation: x   Pain in feet at night that wakes you up from your sleep:     Blood clot in your veins:    Leg swelling:         Pulmonary    Oxygen at home:    Productive cough:     Wheezing:         Neurologic    Sudden weakness in arms or legs:     Sudden numbness in arms or legs:     Sudden onset of difficulty speaking or slurred speech:    Temporary loss of vision in one eye:     Problems with dizziness:         Gastrointestinal    Blood in stool:     Vomited blood:         Genitourinary    Burning when urinating:     Blood in urine:        Psychiatric    Major depression:         Hematologic    Bleeding problems:    Problems with blood clotting too easily:        Skin    Rashes or ulcers:        Constitutional    Fever or chills:      PHYSICAL EXAM:   There were no vitals filed for this visit.  GENERAL: The  patient is  a well-nourished male, in no acute distress. The vital signs are documented above. CARDIAC: There is a regular rate and rhythm.  VASCULAR: Nonpalpable pedal pulses PULMONARY: Non-labored respirations MUSCULOSKELETAL: There are no major deformities or cyanosis. NEUROLOGIC: No focal weakness or paresthesias are detected. SKIN: There are no ulcers or rashes noted. PSYCHIATRIC: The patient has a normal affect.  STUDIES:   I have reviewed the following: +-------+-----------+-----------+------------+------------+  ABI/TBIToday's ABIToday's TBIPrevious ABIPrevious TBI  +-------+-----------+-----------+------------+------------+  Right N/C        .65        N/C         .43           +-------+-----------+-----------+------------+------------+  Left  N/C        .67        N/X         .44           +-------+-----------+-----------+------------+------------+     Right toe pressure: 90 Left toe pressure: 92 Waveforms are monophasic  Right: 30-49% stenosis noted in the iliac segment. Stenosis is noted  within the right mid to distal SFA stent. Moderate progression is noted  compared to previous study.   Left: 30-49% stenosis noted in the iliac segment. 30-49% stenosis noted in  the common femoral artery. Patent left FPBG.    MEDICAL ISSUES:   PAD with claudication: The patient has undergone multiple interventions bilaterally.  The most recent of which was earlier this year.  Ultrasound today shows diminished ABIs however no significant velocity elevation over 300 cm/s was identified.  I have encouraged the patient to continue with exercise and to be as active as possible.  He knows to contact me if he develops a nonhealing wound.  Otherwise he will follow-up in 6 months with ABIs and duplex.    Charlena Cross, MD, FACS [facility] [phone] Pager [phone]

## 2023-07-10 DIAGNOSIS — Z23 Encounter for immunization: Secondary | ICD-10-CM | POA: Diagnosis not present

## 2023-07-25 ENCOUNTER — Other Ambulatory Visit: Payer: Self-pay | Admitting: Cardiovascular Disease

## 2023-07-26 ENCOUNTER — Other Ambulatory Visit: Payer: Self-pay

## 2023-07-26 DIAGNOSIS — I739 Peripheral vascular disease, unspecified: Secondary | ICD-10-CM

## 2023-08-21 ENCOUNTER — Encounter: Payer: Self-pay | Admitting: Oncology

## 2023-09-10 ENCOUNTER — Ambulatory Visit
Admission: EM | Admit: 2023-09-10 | Discharge: 2023-09-10 | Disposition: A | Discharge status: Home / Self Care | Payer: Medicare Other | Attending: Emergency Medicine | Admitting: Emergency Medicine

## 2023-09-10 DIAGNOSIS — M545 Low back pain, unspecified: Secondary | ICD-10-CM

## 2023-09-10 LAB — POCT URINALYSIS DIP (MANUAL ENTRY)
Bilirubin, UA: NEGATIVE
Blood, UA: NEGATIVE
Glucose, UA: NEGATIVE mg/dL
Ketones, POC UA: NEGATIVE mg/dL
Leukocytes, UA: NEGATIVE
Nitrite, UA: NEGATIVE
Protein Ur, POC: NEGATIVE mg/dL
Spec Grav, UA: 1.015 (ref 1.010–1.025)
Urobilinogen, UA: 0.2 U/dL
pH, UA: 5.5 (ref 5.0–8.0)

## 2023-09-10 MED ORDER — PREDNISONE 10 MG PO TABS: 20.0000 mg | ORAL_TABLET | Freq: Every day | ORAL | 0 refills | Status: AC

## 2023-09-10 NOTE — ED Provider Notes (Signed)
 CAY RALPH PELT    CSN: [id number] Arrival date & time: 09/10/23  9188      History   Chief Complaint Chief Complaint  Patient presents with   Back Pain    HPI Ryan Pittman is a 83 y.o. male.  Accompanied by his wife, patient presents with right lower back pain x 2 days.  Treating with Tylenol .  No falls or injury.  The pain occurs with movement and position change; resolves with rest; nonradiating.  He has a mild cough also.  No numbness, weakness, fever, chest pain, shortness of breath, abdominal pain, dysuria.  Patient is on Plavix .  His medical history includes hypertension, atrial fibrillation, CKD.   The history is provided by the patient and medical records.    Past Medical History:  Diagnosis Date   Anemia    due to GIB, s/p transfusion   Anginal pain (HCC) 07/10/2018   Arthritis    back pain, much worse after consecutive golf rounds   Cancer [facility])    thyroid    Chronic kidney disease    Colon polyps    Coronary artery disease    COVID-19    Dyspnea    walking up a hill   Dysrhythmia 2020   GERD (gastroesophageal reflux disease)    Hypercalcemia    h/o, resolved as of 2012, prev due to high amount of calcium intake   Hyperlipidemia    Hypertension    Hypothyroidism    IgG gammopathy    stable as of 2012 per [facility]   Pneumonia    as a child   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   Acute respiratory failure with hypoxia and hypercapnia (HCC)    Idiopathic hypercalcemia 08/03/2019   COVID-19 virus infection    Normocytic anemia 07/09/2019   Ankle pain 07/02/2019   CAD (coronary artery disease) 08/08/2018   PAF (paroxysmal atrial fibrillation) (HCC) 08/06/2018   Hx of CABG 07/11/2018   Health care maintenance 04/04/2018   Atypical chest pain 04/04/2018   Hypothyroidism 03/30/2017   Advance care planning 03/30/2017   Cough 09/27/2016   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   Renal artery stenosis (HCC) 07/04/2016   BCC (basal cell carcinoma), 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   Hyperglycemia 12/28/2015   Hyperlipidemia 12/28/2015   Hypothyroidism, 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   Thyroid  cancer (HCC) 11/12/2014   Shortness of breath 04/22/2014   Spasm 04/22/2014   RLS (restless legs syndrome) 11/16/2013   Insomnia 06/18/2013   Medicare annual wellness visit, subsequent 03/12/2012   HTN (hypertension) 08/14/2011   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   CKD (chronic kidney disease) stage 3, GFR 30-59 ml/min (HCC) 02/13/2011   OA (osteoarthritis) 02/13/2011   Carotid stenosis 02/13/2011   ED (erectile dysfunction) 02/13/2011   Duodenal ulcer 02/13/2011   Diverticulosis 02/13/2011  PVD (peripheral vascular disease) (HCC) 01/26/2011   S/P bypass graft of extremity 01/26/2011   Essential hypertension, benign 01/26/2011   Back pain 01/26/2011   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   Nocturia 05/12/2008   Urinary hesitancy 05/12/2008    Past Surgical History:  Procedure Laterality Date    dental implant left bottom-did have bleeding      ABDOMINAL AORTOGRAM W/LOWER EXTREMITY Bilateral 06/26/2019   Procedure: ABDOMINAL AORTOGRAM W/LOWER EXTREMITY;  Surgeon: Court Dorn PARAS, MD;  [facility]: [facility];  Service: Cardiovascular;  Laterality:  Bilateral;   ABDOMINAL AORTOGRAM W/LOWER EXTREMITY N/A 06/08/2020   Procedure: ABDOMINAL AORTOGRAM W/LOWER EXTREMITY;  Surgeon: Serene Gaile ORN, MD;  Location: [facility];  Service: Cardiovascular;  Laterality: N/A;   ABDOMINAL AORTOGRAM W/LOWER EXTREMITY N/A 11/28/2022   Procedure: ABDOMINAL AORTOGRAM W/LOWER EXTREMITY;  Surgeon: Serene Gaile ORN, MD;  Location: [facility];  Service: Cardiovascular;  Laterality: N/A;   BYPASS GRAFT     L leg   CARDIAC CATHETERIZATION  06/24/2018   COLONOSCOPY N/A 10/09/2021   Procedure: COLONOSCOPY;  Surgeon: Toledo, Ladell POUR, MD;  Location: [facility] ENDOSCOPY;  Service: Gastroenterology;  Laterality: N/A;   COLONOSCOPY WITH PROPOFOL  N/A 05/04/2015   Procedure: COLONOSCOPY WITH PROPOFOL ;  Surgeon: Gladis RAYMOND Mariner, MD;  Location: [facility] ENDOSCOPY;  Service: Endoscopy;  Laterality: N/A;   CORONARY ARTERY BYPASS GRAFT N/A 07/11/2018   Procedure: CORONARY ARTERY BYPASS GRAFTING (CABG) x three, using left internal mammary artery and right leg greater saphenous vein harvested endoscopically;  Surgeon: Lucas Dorise POUR, MD;  Location: [facility] OR;  Service: Open Heart Surgery;  Laterality: N/A;   ENDARTERECTOMY FEMORAL Right 02/13/2020   Procedure: RIGHT ENDARTERECTOMY FEMORAL WITH BOVINE PATCH;  Surgeon: Serene Gaile ORN, MD;  Location: [facility] OR;  Service: Vascular;  Laterality: Right;   ESOPHAGOGASTRODUODENOSCOPY N/A 10/09/2021   Procedure: ESOPHAGOGASTRODUODENOSCOPY (EGD);  Surgeon: Toledo, Ladell POUR, MD;  Location: [facility] ENDOSCOPY;  Service: Gastroenterology;  Laterality: N/A;   GIVENS CAPSULE STUDY N/A 10/09/2021   Procedure: GIVENS CAPSULE STUDY;  Surgeon: Toledo, Ladell POUR, MD;  Location: [facility] ENDOSCOPY;  Service: Gastroenterology;  Laterality: N/A;   LEFT ATRIAL APPENDAGE OCCLUSION N/A 06/29/2022   Procedure: LEFT ATRIAL APPENDAGE OCCLUSION;  Surgeon: Wonda Sharper, MD;  Location: [facility] INVASIVE CV LAB;  Service: Cardiovascular;  Laterality: N/A;   LEFT HEART CATH AND  CORONARY ANGIOGRAPHY N/A 06/24/2018   Procedure: LEFT HEART CATH AND CORONARY ANGIOGRAPHY;  Surgeon: Court Dorn PARAS, MD;  [facility];  Service: Cardiovascular;  Laterality: N/A;   LOWER EXTREMITY ANGIOGRAPHY Bilateral 10/25/2021   Procedure: Lower Extremity Angiography;  Surgeon: Serene Gaile ORN, MD;  [facility];  Service: Cardiovascular;  Laterality: Bilateral;   PERIPHERAL VASCULAR ATHERECTOMY Right 06/08/2020   Procedure: PERIPHERAL VASCULAR ATHERECTOMY;  Surgeon: Serene Gaile ORN, MD;  [facility];  Service: Cardiovascular;  Laterality: Right;  superficial femoral   PERIPHERAL VASCULAR BALLOON ANGIOPLASTY Right 06/08/2020   Procedure: PERIPHERAL VASCULAR BALLOON ANGIOPLASTY;  Surgeon: Serene Gaile ORN, MD;  [facility];  Service: Cardiovascular;  Laterality: Right;  External iliac   PERIPHERAL VASCULAR BALLOON ANGIOPLASTY Right 11/28/2022   Procedure: PERIPHERAL VASCULAR BALLOON ANGIOPLASTY;  Surgeon: Serene Gaile ORN, MD;  [facility];  Service: Cardiovascular;  Laterality: Right;  common illiac   PERIPHERAL VASCULAR INTERVENTION Right 10/25/2021   Procedure: PERIPHERAL VASCULAR INTERVENTION;  Surgeon: Serene Gaile ORN, MD;  [facility];  Service:  Cardiovascular;  Laterality: Right;  SFA   PERIPHERAL VASCULAR INTERVENTION Right 11/28/2022   Procedure: PERIPHERAL VASCULAR INTERVENTION;  Surgeon: Serene Gaile ORN, MD;  [facility];  Service: Cardiovascular;  Laterality: Right;   RENAL ANGIOGRAPHY Bilateral 10/25/2021   Procedure: RENAL ANGIOGRAPHY;  Surgeon: Serene Gaile ORN, MD;  [facility];  Service: Cardiovascular;  Laterality: Bilateral;   TEE WITHOUT CARDIOVERSION N/A 07/11/2018   Procedure: TRANSESOPHAGEAL ECHOCARDIOGRAM (TEE);  Surgeon: Lucas Dorise POUR, MD;  Location: [facility] OR;  Service: Open Heart Surgery;  Laterality: N/A;   TEE WITHOUT CARDIOVERSION N/A  06/29/2022   Procedure: TRANSESOPHAGEAL ECHOCARDIOGRAM (TEE);  Surgeon: Wonda Sharper, MD;  Location: [facility] INVASIVE CV LAB;  Service: Cardiovascular;  Laterality: N/A;   TEE WITHOUT CARDIOVERSION N/A 08/11/2022   Procedure: TRANSESOPHAGEAL ECHOCARDIOGRAM (TEE);  Surgeon: Francyne Headland, MD;  Location: [facility] ENDOSCOPY;  Service: Cardiovascular;  Laterality: N/A;   THYROIDECTOMY, PARTIAL  2016   VASCULAR SURGERY         Home Medications    Prior to Admission medications   Medication Sig Start Date End Date Taking? Authorizing Provider  predniSONE  (DELTASONE ) 10 MG tablet Take 2 tablets (20 mg total) by mouth daily for 3 days. 09/10/23 09/13/23 Yes Corlis Burnard DEL, NP  acetaminophen  (TYLENOL ) 500 MG tablet Take 1,000 mg by mouth every 6 (six) hours as needed for moderate pain or headache.    [provider]  amLODipine  (NORVASC ) 10 MG tablet Take 1 tablet (10 mg total) by mouth daily. 04/30/23   Gollan, Timothy J, MD  Cholecalciferol  (DIALYVITE VITAMIN D  5000 PO) Take 5,000 Units by mouth daily.    [provider]  clopidogrel  (PLAVIX ) 75 MG tablet TAKE ONE TABLET BY MOUTH DAILY 06/19/23   Cooper, Michael, MD  hydrochlorothiazide (HYDRODIURIL) 50 MG tablet Take 1 tablet by mouth daily. 12/22/22 12/22/23  [provider]  hydrOXYzine  (ATARAX ) 10 MG tablet ONE-HALF TO ONE TABLET 3 TIMES DAILY AS NEEDED FOR ANXIETY 05/18/23   Cleatus Arlyss RAMAN, MD  levothyroxine  (SYNTHROID ) 75 MCG tablet TAKE 1 TABLET BY MOUTH 6 DAYS A WEEK AND1 & 1/2 TABLETS ONE DAY A WEEK AS DIRECTED. 01/05/23   Cleatus Arlyss RAMAN, MD  metoprolol  tartrate (LOPRESSOR ) 25 MG tablet TAKE 1 TABLET BY MOUTH TWICE DAILY 01/15/23   Court Dorn PARAS, MD  Multiple Vitamins-Minerals (MULTIVITAMIN MEN 50+) TABS Take 1 tablet by mouth in the morning.    [provider]  pantoprazole  (PROTONIX ) 40 MG tablet Take 1 tablet (40 mg total) by mouth daily. 04/30/23   Gollan, Timothy J, MD  REPATHA  SURECLICK 140 MG/ML SOAJ INJECT  140MG  EVERY 14 DAYS 07/26/23   Court Dorn PARAS, MD  tamsulosin  (FLOMAX ) 0.4 MG CAPS capsule Take 1 capsule (0.4 mg total) by mouth daily. 11/17/22   Twylla Glendia BROCKS, MD    Family History Family History  Problem Relation Age of Onset   Diabetes Mother    Stroke Father    Colon cancer Neg Hx    Prostate cancer Neg Hx     Social History Social History   Tobacco Use   Smoking status: Former    Current packs/day: 0.00    Types: Cigarettes    Quit date: 09/04/2008    Years since quitting: 15.0    Passive exposure: Never   Smokeless tobacco: Former    Types: Chew   Tobacco comments:    HAs quit tobacco products 2009  Vaping Use   Vaping status: Never Used  Substance Use Topics   Alcohol use: Yes    Comment: occ   Drug use: No     Allergies   Penicillins, Ace inhibitors, Aspirin , Celebrex [celecoxib], and Lipitor [atorvastatin]   Review of Systems Review of Systems  Constitutional:  Negative for chills and fever.  Respiratory:  Positive for cough. Negative for shortness of breath.   Cardiovascular:  Negative for chest pain and palpitations.  Gastrointestinal:  Negative for abdominal pain.  Genitourinary:  Negative for dysuria and hematuria.  Musculoskeletal:  Positive for back pain. Negative for gait problem.  Skin:  Negative for color change, rash and wound.  Neurological:  Negative for weakness and numbness.     Physical Exam Triage Vital Signs ED Triage Vitals  Encounter Vitals Group     BP      Systolic BP Percentile      Diastolic BP Percentile      Pulse      Resp      Temp      Temp src      SpO2      Weight      Height      Head Circumference      Peak Flow      Pain Score      Pain Loc      Pain Education      Exclude from Growth Chart    No data found.  Updated Vital Signs BP 128/70   Pulse 80   Temp 99.2 F (37.3 C)   Resp 18   SpO2 95%   Visual Acuity Right Eye Distance:   Left Eye Distance:   Bilateral Distance:    Right  Eye Near:   Left Eye Near:    Bilateral Near:     Physical Exam Constitutional:      General: He is not in acute distress. HENT:     Mouth/Throat:     Mouth: Mucous membranes are moist.  Cardiovascular:     Rate and Rhythm: Normal rate and regular rhythm.     Heart sounds: Normal heart sounds.  Pulmonary:     Effort: Pulmonary effort is normal. No respiratory distress.     Breath sounds: Normal breath sounds.  Abdominal:     General: Bowel sounds are normal.     Palpations: Abdomen is soft.     Tenderness: There is no abdominal tenderness. There is no right CVA tenderness, left CVA tenderness, guarding or rebound.  Musculoskeletal:        General: No deformity. Normal range of motion.  Skin:    General: Skin is warm and dry.  Neurological:     General: No focal deficit present.     Mental Status: He is alert and oriented to person, place, and time.     Sensory: No sensory deficit.     Motor: No weakness.     Gait: Gait normal.      UC Treatments / Results  Labs (all labs ordered are listed, but only abnormal results are displayed) Labs Reviewed  POCT URINALYSIS DIP (MANUAL ENTRY)    EKG   Radiology No results found.  Procedures Procedures (including critical care time)  Medications Ordered in UC Medications - No data to display  Initial Impression / Assessment and Plan / UC Course  I have reviewed the triage vital signs and the nursing notes.  Pertinent labs & imaging results that were available during my care of the patient were reviewed by me and  considered in my medical decision making (see chart for details).    Right lower back pain without sciatica.  No injury.  Afebrile, VSS. Lungs are clear, O2 sat 95%.  Urine normal.  Patient is not able to take NSAIDs.  Treating today with 3-day course of prednisone  20 mg.  Encouraged patient to do gentle stretching and massage; topical Biofreeze.  Tylenol  as needed.  Instructed him to follow-up with his PCP.   Education provided on back pain.  Patient agrees to plan of care.  Final Clinical Impressions(s) / UC Diagnoses   Final diagnoses:  Acute right-sided low back pain without sciatica     Discharge Instructions      Take the prednisone  as directed.  Follow up with your primary care provider.        ED Prescriptions     Medication Sig Dispense Auth. Provider   predniSONE  (DELTASONE ) 10 MG tablet Take 2 tablets (20 mg total) by mouth daily for 3 days. 6 tablet Corlis Burnard DEL, NP      I have reviewed the PDMP during this encounter.   Corlis Burnard DEL, NP 09/10/23 [phone]

## 2023-09-10 NOTE — Discharge Instructions (Addendum)
 Take the prednisone as directed.  Follow up with your primary care provider.

## 2023-09-10 NOTE — ED Triage Notes (Signed)
 Patient to Urgent Care with complaints of  right sided, lower back pain. Denies any radiation. Worse with standing/ walking/ movement. Reports that he feels like he has been coming down with something since Thursday.   Symptoms started two days ago. Denies any known injury.

## 2023-09-12 ENCOUNTER — Ambulatory Visit: Payer: Medicare Other | Admitting: Podiatry

## 2023-09-12 DIAGNOSIS — J209 Acute bronchitis, unspecified: Secondary | ICD-10-CM | POA: Diagnosis not present

## 2023-09-12 DIAGNOSIS — R918 Other nonspecific abnormal finding of lung field: Secondary | ICD-10-CM | POA: Diagnosis not present

## 2023-09-12 DIAGNOSIS — I700 Atherosclerosis of aorta: Secondary | ICD-10-CM | POA: Diagnosis not present

## 2023-09-13 ENCOUNTER — Encounter: Payer: Self-pay | Admitting: Oncology

## 2023-09-14 ENCOUNTER — Ambulatory Visit: Payer: Medicare Other | Admitting: Family Medicine

## 2023-09-18 DIAGNOSIS — N1832 Chronic kidney disease, stage 3b: Secondary | ICD-10-CM | POA: Diagnosis not present

## 2023-09-18 DIAGNOSIS — I10 Essential (primary) hypertension: Secondary | ICD-10-CM | POA: Diagnosis not present

## 2023-09-18 DIAGNOSIS — D631 Anemia in chronic kidney disease: Secondary | ICD-10-CM | POA: Diagnosis not present

## 2023-09-26 ENCOUNTER — Ambulatory Visit: Payer: Medicare Other | Admitting: Podiatry

## 2023-09-27 ENCOUNTER — Ambulatory Visit: Payer: Medicare Other | Admitting: Family Medicine

## 2023-09-27 ENCOUNTER — Encounter: Payer: Self-pay | Admitting: Family Medicine

## 2023-09-27 VITALS — BP 152/68 | HR 65 | Temp 98.1°F | Ht 67.0 in | Wt 153.8 lb

## 2023-09-27 DIAGNOSIS — R059 Cough, unspecified: Secondary | ICD-10-CM

## 2023-09-27 DIAGNOSIS — D649 Anemia, unspecified: Secondary | ICD-10-CM | POA: Diagnosis not present

## 2023-09-27 LAB — CBC WITH DIFFERENTIAL/PLATELET
Basophils Absolute: 0.1 10*3/uL (ref 0.0–0.1)
Basophils Relative: 1.2 % (ref 0.0–3.0)
Eosinophils Absolute: 0.2 10*3/uL (ref 0.0–0.7)
Eosinophils Relative: 2 % (ref 0.0–5.0)
HCT: 26.8 % — ABNORMAL LOW (ref 39.0–52.0)
Hemoglobin: 8.1 g/dL — ABNORMAL LOW (ref 13.0–17.0)
Lymphocytes Relative: 11.7 % — ABNORMAL LOW (ref 12.0–46.0)
Lymphs Abs: 1.2 10*3/uL (ref 0.7–4.0)
MCHC: 30.4 g/dL (ref 30.0–36.0)
MCV: 72.3 fL — ABNORMAL LOW (ref 78.0–100.0)
Monocytes Absolute: 0.7 10*3/uL (ref 0.1–1.0)
Monocytes Relative: 6.9 % (ref 3.0–12.0)
Neutro Abs: 7.8 10*3/uL — ABNORMAL HIGH (ref 1.4–7.7)
Neutrophils Relative %: 78.2 % — ABNORMAL HIGH (ref 43.0–77.0)
Platelets: 260 10*3/uL (ref 150.0–400.0)
RBC: 3.7 Mil/uL — ABNORMAL LOW (ref 4.22–5.81)
RDW: 20.1 % — ABNORMAL HIGH (ref 11.5–15.5)
WBC: 10 10*3/uL (ref 4.0–10.5)

## 2023-09-27 LAB — FERRITIN: Ferritin: 11.1 ng/mL — ABNORMAL LOW (ref 22.0–322.0)

## 2023-09-27 LAB — VITAMIN B12: Vitamin B-12: 598 pg/mL (ref 211–911)

## 2023-09-27 LAB — IRON: Iron: 21 ug/dL — ABNORMAL LOW (ref 42–165)

## 2023-09-27 MED ORDER — DOXYCYCLINE HYCLATE 100 MG PO TABS: 100.0000 mg | ORAL_TABLET | Freq: Two times a day (BID) | ORAL | 0 refills | Status: DC

## 2023-09-27 NOTE — Assessment & Plan Note (Signed)
No black stools.  On plavix.  Not anticoagulated o/w.  Recheck labs pending.  Has seen [facility] hematology- will await results but may need heme input.  D/w pt.  At this point, not in distress and okay for outpatient f/u.

## 2023-09-27 NOTE — Patient Instructions (Addendum)
Go to the lab on the way out.   If you have mychart we'll likely use that to update you.    Take care.  Glad to see you. Start doxycycline in the meantime.  Update [place] as needed.

## 2023-09-27 NOTE — Progress Notes (Signed)
Cough. Phlegm as well. States that the color is yellowish. Ongoing for about 3 weeks. Prev seen by pulmonary.  Done with prednisone course.  No fevers.  Prev eval by pulmonary with note reviewed- was seen 09/12/23.    BP has been lower, ie controlled, on home checks, d/w pt.    Overall rare use of mirapex.    H/o anemia.  Recheck labs pending. On plavix.  Not taking any iron.  No black stools.  Prev CBCs d/w pt, with downward trend.   Meds, vitals, and allergies reviewed.   ROS: Per HPI unless specifically indicated in ROS section   Nad Ncat Sinuses not ttp x4 Neck supple, no LA Rrr with SEM noted.  Ctab Abd soft not ttp Ext w/o edema.  Skin well perfused.

## 2023-09-27 NOTE — Assessment & Plan Note (Signed)
Several weeks of sx, with discolored sputum.  CTAB and not in distress.  Presumed bronchitis.  Start doxy, routine cautions given to patient.  He agrees with plan.

## 2023-09-28 ENCOUNTER — Other Ambulatory Visit: Payer: Self-pay | Admitting: Family Medicine

## 2023-09-28 ENCOUNTER — Telehealth: Payer: Self-pay

## 2023-09-28 DIAGNOSIS — D649 Anemia, unspecified: Secondary | ICD-10-CM

## 2023-09-28 MED ORDER — IRON (FERROUS SULFATE) 325 (65 FE) MG PO TABS: 325.0000 mg | ORAL_TABLET | Freq: Every day | ORAL | Status: DC

## 2023-09-28 MED ORDER — CLOPIDOGREL BISULFATE 75 MG PO TABS: ORAL_TABLET | ORAL | Status: DC

## 2023-09-28 NOTE — Telephone Encounter (Signed)
Reached out to patient, I did advise that although the daughter did call she is not listed on his DPR therefore I am unable to review anything with her. But once Dr. Para March reviews lab results. I will reach out to him. He understood.      Copied from CRM [id number]. Topic: Clinical - Lab/Test Results >> Sep 28, 2023 11:41 AM Almira Coaster wrote: Reason for CRM: Patient's daughter is calling regarding lab results that were done yesterday. She states that she was able to see results on MyChart ;however, the patient isn't very good with answering phone calls.

## 2023-09-28 NOTE — Telephone Encounter (Signed)
Returned call to spouse who is on the DPR did advise once Dr. Para March review lab results we will reach back out      Copied from CRM [id number]. Topic: Clinical - Lab/Test Results >> Sep 28, 2023 12:15 PM Gurney Maxin H wrote: Reason for CRM: Patients wife is calling to discuss her husbands lab results with provider.  Okey Regal [phone]

## 2023-10-01 ENCOUNTER — Other Ambulatory Visit: Payer: Self-pay | Admitting: Cardiology

## 2023-10-03 ENCOUNTER — Ambulatory Visit: Payer: Self-pay | Admitting: Family Medicine

## 2023-10-03 NOTE — Telephone Encounter (Signed)
   Chief Complaint: information only  Additional Notes:  Patient called to verify information from Dr. Para March pertaining to his labs and stopping Plavix. This RN provided information from Dr. Para March on chart and advised patient to go to ED with worsening symptoms, per Dr. Lianne Bushy recommendation.   Joaquim Nam, MD 09/28/2023  4:33 PM EST Back to Top    If he can do all of the following- 1 stop plavix. 2 start iron 3 get seen by hematology next week 4 and not feel worse in the meantime- then he wouldn't have to go to the ER.   If he can't do all of that, then he needs to be at the ER.   If he can't get seen by hematology next week, then I want to recheck his CBC here at clinic.  I put in a follow up order.  If his HGB is any worse on recheck, then he needs ER eval.     Thanks.          Reason for Disposition  General information question, no triage required and triager able to answer question  Answer Assessment - Initial Assessment Questions 1. REASON FOR CALL or QUESTION: "What is your reason for calling today?" or "How can I best help you?" or "What question do you have that I can help answer?"     Patient called to verify information from Dr. Para March pertaining to his labs and stopping Plavix. This RN provided information from Dr. Para March on chart and advised patient to go to ED with worsening symptoms, per Dr. Lianne Bushy recommendation.  Protocols used: Information Only Call - No Triage-A-AH

## 2023-10-03 NOTE — Telephone Encounter (Signed)
I would recheck his CBC here this week as long as he isn't feeling worse.  I put in the order.  If feeling worse, then go to the ER.  Thank you for checking on hematology f/u.  Please have him call about getting his f/u moved sooner with hematology.

## 2023-10-03 NOTE — Telephone Encounter (Signed)
Noted. Thanks.

## 2023-10-03 NOTE — Telephone Encounter (Signed)
Lab appointment scheduled. Patient will try to call hematology is as well. He is also aware about going to the ED

## 2023-10-03 NOTE — Telephone Encounter (Signed)
Reached out to patient:  I did verify that he stopped the Plavix and he did start the iron. He advised he did. Patient has not been able to get an appointment with hematology. But he says he feels okay aside from his legs getting tired. I did reach back out to [facility] and spoke with Cameron Proud who advised that they had not gotten a response from the provider to see about getting the patient seen sooner. She will resend the message and followbup with my myself and the patient. In the meantime do you want to place an order for the patient to come back in for lab work Please advise.

## 2023-10-04 ENCOUNTER — Other Ambulatory Visit: Payer: Self-pay | Admitting: Family Medicine

## 2023-10-04 ENCOUNTER — Other Ambulatory Visit (INDEPENDENT_AMBULATORY_CARE_PROVIDER_SITE_OTHER): Payer: Medicare Other

## 2023-10-04 ENCOUNTER — Telehealth: Payer: Self-pay | Admitting: Family Medicine

## 2023-10-04 DIAGNOSIS — D649 Anemia, unspecified: Secondary | ICD-10-CM

## 2023-10-04 LAB — CBC WITH DIFFERENTIAL/PLATELET
Basophils Absolute: 0.1 10*3/uL (ref 0.0–0.1)
Basophils Relative: 1.1 % (ref 0.0–3.0)
Eosinophils Absolute: 0.1 10*3/uL (ref 0.0–0.7)
Eosinophils Relative: 1.4 % (ref 0.0–5.0)
HCT: 27.2 % — ABNORMAL LOW (ref 39.0–52.0)
Hemoglobin: 8.3 g/dL — ABNORMAL LOW (ref 13.0–17.0)
Lymphocytes Relative: 16.2 % (ref 12.0–46.0)
Lymphs Abs: 1.4 10*3/uL (ref 0.7–4.0)
MCHC: 30.5 g/dL (ref 30.0–36.0)
MCV: 72.8 fL — ABNORMAL LOW (ref 78.0–100.0)
Monocytes Absolute: 0.6 10*3/uL (ref 0.1–1.0)
Monocytes Relative: 7.2 % (ref 3.0–12.0)
Neutro Abs: 6.6 10*3/uL (ref 1.4–7.7)
Neutrophils Relative %: 74.1 % (ref 43.0–77.0)
Platelets: 243 10*3/uL (ref 150.0–400.0)
RBC: 3.74 Mil/uL — ABNORMAL LOW (ref 4.22–5.81)
RDW: 21 % — ABNORMAL HIGH (ref 11.5–15.5)
WBC: 8.9 10*3/uL (ref 4.0–10.5)

## 2023-10-04 NOTE — Telephone Encounter (Signed)
I don't suspect that was the issue- more likely that plavix was contributing.  Should be okay to continue for now with omega 3.  Thanks.

## 2023-10-04 NOTE — Telephone Encounter (Signed)
Patient was here for labs and said he had been taking Omega 3 supplement 900 mg. Pt stated that he read one of the side effects is that it could cause bleeding. Wants to discuss further is he should quit taking it he thinks he should. Please advise.

## 2023-10-04 NOTE — Telephone Encounter (Signed)
Left voicemail for patient to return call to office.

## 2023-10-05 ENCOUNTER — Telehealth: Payer: Self-pay | Admitting: Family Medicine

## 2023-10-05 NOTE — Telephone Encounter (Signed)
Copied from CRM [id number]. Topic: General - Call Back - No Documentation >> Oct 05, 2023  9:31 AM Kathryne Eriksson wrote: Reason for CRM: [facility] >> Oct 05, 2023  9:33 AM Kathryne Eriksson wrote: Heber Nickerson called in on behalf of the patient, stating they were wanting to speak with [name] directly in regards to something they spoke previously about. I began to have system issues and caller was no longer able to hear me,therefor the call ended up being dropped.. I made the attempt to call back but I wasn't able to get anyone.

## 2023-10-05 NOTE — Telephone Encounter (Signed)
FYI: Some good news

## 2023-10-05 NOTE — Telephone Encounter (Signed)
Copied from CRM [id number]. Topic: Appointments - Appointment Scheduling >> Oct 05, 2023  9:33 AM Drema Balzarine wrote: [facility] calling to let [name] know that they got patients appointment rescheduled from May 2025 to November 14, 2023.

## 2023-10-07 NOTE — Telephone Encounter (Signed)
Thanks.  I want to recheck his CBC in 1 week at a nonfasting lab visit.

## 2023-10-08 ENCOUNTER — Telehealth: Payer: Self-pay

## 2023-10-08 NOTE — Telephone Encounter (Signed)
Patient called back I and scheduled 1 week lab appt.

## 2023-10-08 NOTE — Telephone Encounter (Signed)
Copied from CRM [id number]. Topic: Clinical - Medical Advice >> Oct 08, 2023 10:13 AM Melissa C wrote: Reason for CRM: patient has been off of Plavix almost 2 weeks and his legs are bothering him. Wife stated that taking him off of Plavix barely rose his blood count number and he has stents in his legs that they see another doctor for and that doctor is going to need him to have good blood flow in his legs which he currently does not have due to being off of Plavix. She is not sure what he should do as his appointment with his other doctor is coming up soon. Please advise with patient's wife. Thank you

## 2023-10-08 NOTE — Telephone Encounter (Signed)
I get her point- the goal would be to restart plavix when possible- but I am worried that he'll have progressive blood loss on med.  I routed this to Dr. Myra Gianotti in the meantime for his input.    I wouldn't restart plavix yet.   I thank all involved.

## 2023-10-08 NOTE — Telephone Encounter (Signed)
 Unable to reach patient. Left voicemail to return call to our office.

## 2023-10-08 NOTE — Telephone Encounter (Signed)
 Called patient and reviewed all information. Patient verbalized understanding. Will call if any further questions.

## 2023-10-09 ENCOUNTER — Other Ambulatory Visit: Payer: Medicare Other

## 2023-10-09 NOTE — Telephone Encounter (Signed)
Spoke with patient to advise of Dr. Para March recommendation of not restarting the plavix a this time

## 2023-10-10 ENCOUNTER — Encounter: Payer: Self-pay | Admitting: Podiatry

## 2023-10-10 ENCOUNTER — Ambulatory Visit (INDEPENDENT_AMBULATORY_CARE_PROVIDER_SITE_OTHER): Payer: Medicare Other | Admitting: Podiatry

## 2023-10-10 ENCOUNTER — Ambulatory Visit (INDEPENDENT_AMBULATORY_CARE_PROVIDER_SITE_OTHER): Payer: Medicare Other

## 2023-10-10 DIAGNOSIS — M2012 Hallux valgus (acquired), left foot: Secondary | ICD-10-CM | POA: Diagnosis not present

## 2023-10-10 DIAGNOSIS — M10371 Gout due to renal impairment, right ankle and foot: Secondary | ICD-10-CM

## 2023-10-10 DIAGNOSIS — M2011 Hallux valgus (acquired), right foot: Secondary | ICD-10-CM

## 2023-10-10 DIAGNOSIS — M10372 Gout due to renal impairment, left ankle and foot: Secondary | ICD-10-CM

## 2023-10-10 NOTE — Progress Notes (Signed)
  Subjective:  Patient ID: Ryan Pittman, male    DOB: 05-Sep-1940,  MRN: [id number]  Chief Complaint  Patient presents with   Toe Pain    Both of my big toes hurt.    83 y.o. male presents with the above complaint. History confirmed with patient.  This has been going on for a few weeks.  Reports no known injury.  Reports that he does eat red meat including steak and has occasional alcohol use.  Objective:  Physical Exam: warm, good capillary refill, no trophic changes or ulcerative lesions, normal DP and PT pulses, normal sensory exam, and pain and swelling around the first MTP joint on the right foot less so on the left foot.   Radiographs: Multiple views x-ray of both feet: Soft tissue swelling and calcification of the medial capsule of the right first MTP joint swelling without calcification of the left Assessment:   1. 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   Suspect he is developing gouty arthritis.  I recommended evaluating with uric acid and CBC and ESR.  Order was given him to go to [facility] today to have this checked.  Pending results of this I will prescribe him colchicine  or prednisone .  Follow-up with me as needed if it continues to progress or worsens.  No follow-ups on file.

## 2023-10-11 ENCOUNTER — Encounter: Payer: Self-pay | Admitting: Podiatry

## 2023-10-11 LAB — CBC WITH DIFFERENTIAL/PLATELET
Basophils Absolute: 0.1 10*3/uL (ref 0.0–0.2)
Basos: 1 %
EOS (ABSOLUTE): 0.3 10*3/uL (ref 0.0–0.4)
Eos: 3 %
Hematocrit: 31.1 % — ABNORMAL LOW (ref 37.5–51.0)
Hemoglobin: 9.4 g/dL — ABNORMAL LOW (ref 13.0–17.7)
Immature Grans (Abs): 0.1 10*3/uL (ref 0.0–0.1)
Immature Granulocytes: 1 %
Lymphocytes Absolute: 1.4 10*3/uL (ref 0.7–3.1)
Lymphs: 13 %
MCH: 23.1 pg — ABNORMAL LOW (ref 26.6–33.0)
MCHC: 30.2 g/dL — ABNORMAL LOW (ref 31.5–35.7)
MCV: 76 fL — ABNORMAL LOW (ref 79–97)
Monocytes Absolute: 0.8 10*3/uL (ref 0.1–0.9)
Monocytes: 8 %
Neutrophils Absolute: 8.1 10*3/uL — ABNORMAL HIGH (ref 1.4–7.0)
Neutrophils: 74 %
Platelets: 277 10*3/uL (ref 150–450)
RBC: 4.07 x10E6/uL — ABNORMAL LOW (ref 4.14–5.80)
RDW: 21.2 % — ABNORMAL HIGH (ref 11.6–15.4)
WBC: 10.8 10*3/uL (ref 3.4–10.8)

## 2023-10-11 LAB — BASIC METABOLIC PANEL
BUN/Creatinine Ratio: 19 (ref 10–24)
BUN: 23 mg/dL (ref 8–27)
CO2: 22 mmol/L (ref 20–29)
Calcium: 9.8 mg/dL (ref 8.6–10.2)
Chloride: 102 mmol/L (ref 96–106)
Creatinine, Ser: 1.18 mg/dL (ref 0.76–1.27)
Glucose: 92 mg/dL (ref 70–99)
Potassium: 4.7 mmol/L (ref 3.5–5.2)
Sodium: 140 mmol/L (ref 134–144)
eGFR: 61 mL/min/{1.73_m2} (ref >59)

## 2023-10-11 LAB — URIC ACID: Uric Acid: 9.4 mg/dL — ABNORMAL HIGH (ref 3.8–8.4)

## 2023-10-11 MED ORDER — COLCHICINE 0.6 MG PO TABS: ORAL_TABLET | ORAL | 2 refills | Status: DC

## 2023-10-11 NOTE — Addendum Note (Signed)
 Addended byMichalene Agee, Eliany Mccarter R on: 10/11/2023 05:07 PM   Modules accepted: Orders

## 2023-10-12 ENCOUNTER — Telehealth: Payer: Self-pay

## 2023-10-12 NOTE — Telephone Encounter (Addendum)
 Patient's wife called about results and she states that patient was not given any diet information. She would like any diet information emailed to her at [email or web address]. I sent information on low purine diet as well as mailed to patient.

## 2023-10-15 ENCOUNTER — Other Ambulatory Visit (INDEPENDENT_AMBULATORY_CARE_PROVIDER_SITE_OTHER): Payer: Medicare Other

## 2023-10-15 DIAGNOSIS — D649 Anemia, unspecified: Secondary | ICD-10-CM

## 2023-10-15 DIAGNOSIS — J449 Chronic obstructive pulmonary disease, unspecified: Secondary | ICD-10-CM | POA: Diagnosis not present

## 2023-10-15 LAB — CBC WITH DIFFERENTIAL/PLATELET
Basophils Absolute: 0.1 10*3/uL (ref 0.0–0.1)
Basophils Relative: 1.6 % (ref 0.0–3.0)
Eosinophils Absolute: 0.1 10*3/uL (ref 0.0–0.7)
Eosinophils Relative: 1.7 % (ref 0.0–5.0)
HCT: 31.2 % — ABNORMAL LOW (ref 39.0–52.0)
Hemoglobin: 9.5 g/dL — ABNORMAL LOW (ref 13.0–17.0)
Lymphocytes Relative: 15 % (ref 12.0–46.0)
Lymphs Abs: 1.3 10*3/uL (ref 0.7–4.0)
MCHC: 30.6 g/dL (ref 30.0–36.0)
MCV: 77.1 fL — ABNORMAL LOW (ref 78.0–100.0)
Monocytes Absolute: 0.7 10*3/uL (ref 0.1–1.0)
Monocytes Relative: 8.1 % (ref 3.0–12.0)
Neutro Abs: 6.3 10*3/uL (ref 1.4–7.7)
Neutrophils Relative %: 73.6 % (ref 43.0–77.0)
Platelets: 287 10*3/uL (ref 150.0–400.0)
RBC: 4.04 Mil/uL — ABNORMAL LOW (ref 4.22–5.81)
RDW: 27.4 % — ABNORMAL HIGH (ref 11.5–15.5)
WBC: 8.5 10*3/uL (ref 4.0–10.5)

## 2023-10-16 ENCOUNTER — Encounter: Payer: Self-pay | Admitting: Family Medicine

## 2023-10-18 ENCOUNTER — Telehealth: Payer: Self-pay

## 2023-10-18 NOTE — Telephone Encounter (Signed)
Please see mychart message and result note. Please call Dr. Estanislado Spire clinic about getting patient seen. Thanks.

## 2023-10-18 NOTE — Telephone Encounter (Signed)
Copied from CRM [id number]. Topic: Clinical - Medical Advice >> Oct 17, 2023  2:37 PM Elizebeth Brooking wrote: Reason for CRM: Patient called in stating if he should go back on the Plavix, he is requesting the nurse to give him a call back regarding this matter    [phone]

## 2023-10-19 ENCOUNTER — Telehealth: Payer: Self-pay

## 2023-10-19 NOTE — Telephone Encounter (Signed)
This is issue is being addressed via another encounter. Closing this message

## 2023-10-19 NOTE — Telephone Encounter (Signed)
Copied from CRM [id number]. Topic: General - Other >> Oct 19, 2023  2:18 PM Sim Boast F wrote: Reason for CRM: Marcelino Duster from [facility] returning [place] phone call but she was with a  patient. Marcelino Duster says she does not need a call back, she will put a note in patients chart for [name] to review

## 2023-10-19 NOTE — Telephone Encounter (Signed)
I reached out to [facility]) to change the patients appointment and this is what they sent back:   Appointment/Triage:  Ryan Pittman, CMA for Ryan Pittman, left message stating MD was concerned about pt's blood loss and taking Plavix.  MD hoping to have appointment moved up from May.  -called pt stating we were more than happy to change the appointment so he could discuss this with Ryan Pittman.  Pt states he is aware of Ryan Pittman recommendation and he was not sure what to do stating they made him appointment with a doctor at [facility].  His current plan is to see what the hematologists says and maybe see a doctor because of the GI bleed.   -pt confirms understanding and will call as needed.

## 2023-10-19 NOTE — Telephone Encounter (Signed)
Appointment/Triage:  Mertie Moores, CMA for Dr. Para March, left message stating MD was concerned about pt's blood loss and taking Plavix.  MD hoping to have appointment moved up from May.  -called pt stating we were more than happy to change the appointment so he could discuss this with Dr. Myra Gianotti.  Pt states he is aware of Dr. Estanislado Spire recommendation and he was not sure what to do stating they made him appointment with a doctor at [facility].  His current plan is to see what the hematologists says and maybe see a doctor because of the GI bleed.   -pt confirms understanding and will call as needed.

## 2023-10-21 NOTE — Telephone Encounter (Signed)
 Noted. Thanks.

## 2023-10-22 ENCOUNTER — Ambulatory Visit: Payer: Self-pay | Admitting: Family Medicine

## 2023-10-22 NOTE — Telephone Encounter (Signed)
 Noted. Thanks.

## 2023-10-22 NOTE — Telephone Encounter (Signed)
Copied from CRM [id number]. Topic: Clinical - Red Word Triage >> Oct 22, 2023  8:07 AM Theodis Sato wrote: Red Word that prompted transfer to Nurse Triage: Increased cough since Thursday - not sleeping due to cough. **Patients wife speaking**    Chief Complaint: cough Symptoms: cough Frequency: constant Pertinent Negatives: Patient denies chest pain Disposition: [] ED /[] Urgent Care (no appt availability in office) / [x] Appointment(In office/virtual)/ []  [facility]/ [] Home Care/ [] Refused Recommended Disposition /[] [place]/ []  Follow-up with PCP Additional Notes: Per wife, pt with cough x 3-4 days, getting worse and not getting sleep. Pt is short of breath when coughing. Appt set for 2/18 with Dr. Para March  Reason for Disposition  SEVERE coughing spells (e.g., whooping sound after coughing, vomiting after coughing)    Shortness of breath when coughing  Answer Assessment - Initial Assessment Questions 1. ONSET: "When did the cough begin?"      4 Days ago  2. SEVERITY: "How bad is the cough today?"      Getting worse, worse last night  3. SPUTUM: "Describe the color of your sputum" (none, dry cough; clear, white, yellow, green)     White  4. HEMOPTYSIS: "Are you coughing up any blood?" If so ask: "How much?" (flecks, streaks, tablespoons, etc.)     No  5. DIFFICULTY BREATHING: "Are you having difficulty breathing?" If Yes, ask: "How bad is it?" (e.g., mild, moderate, severe)    - MILD: No SOB at rest, mild SOB with walking, speaks normally in sentences, can lie down, no retractions, pulse < 100.    - MODERATE: SOB at rest, SOB with minimal exertion and prefers to sit, cannot lie down flat, speaks in phrases, mild retractions, audible wheezing, pulse 100-120.    - SEVERE: Very SOB at rest, speaks in single words, struggling to breathe, sitting hunched forward, retractions, pulse > 120      Mild shortness of breath; loses breath when he cough continuously  6.  FEVER: "Do you have a fever?" If Yes, ask: "What is your temperature, how was it measured, and when did it start?"     Low grade 99.69F  7. CARDIAC HISTORY: "Do you have any history of heart disease?" (e.g., heart attack, congestive heart failure)      Afib ,HTN, CAD  8. LUNG HISTORY: "Do you have any history of lung disease?"  (e.g., pulmonary embolus, asthma, emphysema)     No  9. PE RISK FACTORS: "Do you have a history of blood clots?" (or: recent major surgery, recent prolonged travel, bedridden)     No  10. OTHER SYMPTOMS: "Do you have any other symptoms?" (e.g., runny nose, wheezing, chest pain)       Legs achy, nasal congestion and runny nose  11. PREGNANCY: "Is there any chance you are pregnant?" "When was your last menstrual period?"       No  12. TRAVEL: "Have you traveled out of the country in the last month?" (e.g., travel history, exposures)       No  Protocols used: Cough - Acute Productive-A-AH

## 2023-10-23 ENCOUNTER — Encounter: Payer: Self-pay | Admitting: Family Medicine

## 2023-10-23 ENCOUNTER — Ambulatory Visit (INDEPENDENT_AMBULATORY_CARE_PROVIDER_SITE_OTHER): Payer: Medicare Other | Admitting: Family Medicine

## 2023-10-23 VITALS — BP 126/60 | HR 86 | Temp 98.7°F | Ht 67.5 in | Wt 151.8 lb

## 2023-10-23 DIAGNOSIS — D649 Anemia, unspecified: Secondary | ICD-10-CM

## 2023-10-23 DIAGNOSIS — R059 Cough, unspecified: Secondary | ICD-10-CM

## 2023-10-23 LAB — POC COVID19 BINAXNOW: SARS Coronavirus 2 Ag: NEGATIVE

## 2023-10-23 MED ORDER — BENZONATATE 200 MG PO CAPS
200.0000 mg | ORAL_CAPSULE | Freq: Three times a day (TID) | ORAL | 1 refills | Status: DC | PRN
Start: 2023-10-23 — End: 2024-01-08

## 2023-10-23 MED ORDER — DOXYCYCLINE HYCLATE 100 MG PO TABS: 100.0000 mg | ORAL_TABLET | Freq: Two times a day (BID) | ORAL | 0 refills | Status: DC

## 2023-10-23 NOTE — Patient Instructions (Addendum)
Covid negative.   Start doxycycline and use tessalon for the cough.  Take care.  Glad to see you. Update me as needed.   Take care.  Glad to see you.  Recheck labs in about 2 weeks.  Nonfasting lab visit.

## 2023-10-23 NOTE — Progress Notes (Unsigned)
Cough.  4-5 days.  Coughing more yesterday and last night.  Minimal sputum. No fevers.  No vomiting.  No facial pain.  No ear pain.  No ST.  Some wheeze.  Cough is some better this AM.  Taking cough drops.  Wife sick with a cough.  No having diffuse aches.    Anemia.  Slowly improving HGB.  More leg fatigue in the meantime, unclear if from anemia vs being off plavix.  No leg pain.  No leg cramping.  No leg wounds.  He gets tired but doesn't have specific cramping with walking.    Meds, vitals, and allergies reviewed.   ROS: Per HPI unless specifically indicated in ROS section   GEN: nad, alert and oriented HEENT: mucous membranes moist NECK: supple w/o LA CV: rrr.  SEM noted.  PULM: no inc wob but diffuse rhonchi.  Not SOB.  No focal dec in BS.  ABD: soft, +bs EXT: no edema SKIN: no acute rash  Faint but palpable DP pulses.  No skin breakdown and normal cap refill.

## 2023-10-24 NOTE — Assessment & Plan Note (Addendum)
Slowly improving.  Would not restart plavix yet given risk of bleeding, with presumed prev GI loss while on plavix.  D/w pt about prev endoscopy.  Recheck labs in about 2 weeks.  Nonfasting lab visit.  Presume that risk of restart plavix > benefit of plavix at this point.

## 2023-10-24 NOTE — Assessment & Plan Note (Signed)
Covid negative.  Presumed bronchitis.  Start doxycycline and use tessalon for the cough.  Update me as needed.

## 2023-10-25 ENCOUNTER — Ambulatory Visit: Payer: Self-pay | Admitting: Family Medicine

## 2023-10-25 NOTE — Telephone Encounter (Signed)
Left message on VM for pt or wife to call so I can see how he is feeling from his OV 2 days ago.

## 2023-10-25 NOTE — Telephone Encounter (Signed)
She is not on DPR so we could not speak to her anyway. Should I call the pt?

## 2023-10-25 NOTE — Telephone Encounter (Signed)
Agreed, that pulse ox is normal.  Please let me know what you hear.

## 2023-10-25 NOTE — Telephone Encounter (Signed)
Spoke to pt's wife. Advised her I just wanted to check on him since we were out of the office yesterday and could not follow-up. She said this afternoon he seems to be doing much better. Pt got on the phone and said he is still coughing but it is getting much better. Cough is productive now. Appreciated the call.

## 2023-10-25 NOTE — Telephone Encounter (Signed)
Info only. No contact with pt. Pt seen on 2/18 with presumed bronchitis. Started on abx and pearls.       Copied from CRM [id number]. Topic: Clinical - Red Word Triage >> Oct 25, 2023  1:27 PM Adele Barthel wrote: Red Word that prompted transfer to Nurse Triage:   Patient's daughter Selena Batten is calling in due to concerns with her father. He was seen on 2/18 for a cough. She spoke with him on the phone today and her mother reported his oxygen was at 94%. The wife said they would call the clinic if he got worse but the daughter is concerned and wants the provider to be aware. She did not want it mentioned she called the clinic because she is afraid the patient will get mad with her for calling and not trust to tell her when he is not feeling well. She asked if the patient's wife could be contacted to check on him and to see if they needed assistance.   CB# [phone]

## 2023-10-26 NOTE — Telephone Encounter (Signed)
 Noted. Thanks.

## 2023-11-01 ENCOUNTER — Ambulatory Visit: Payer: Medicare Other | Admitting: Family Medicine

## 2023-11-06 ENCOUNTER — Encounter: Payer: Self-pay | Admitting: Family Medicine

## 2023-11-06 ENCOUNTER — Ambulatory Visit (INDEPENDENT_AMBULATORY_CARE_PROVIDER_SITE_OTHER): Payer: Medicare Other | Admitting: Family Medicine

## 2023-11-06 VITALS — BP 128/58 | HR 59 | Temp 98.0°F | Ht 67.5 in | Wt 150.2 lb

## 2023-11-06 DIAGNOSIS — I739 Peripheral vascular disease, unspecified: Secondary | ICD-10-CM

## 2023-11-06 DIAGNOSIS — D649 Anemia, unspecified: Secondary | ICD-10-CM

## 2023-11-06 LAB — CBC WITH DIFFERENTIAL/PLATELET
Basophils Absolute: 0.1 10*3/uL (ref 0.0–0.1)
Basophils Relative: 1.3 % (ref 0.0–3.0)
Eosinophils Absolute: 0.2 10*3/uL (ref 0.0–0.7)
Eosinophils Relative: 1.9 % (ref 0.0–5.0)
HCT: 36.2 % — ABNORMAL LOW (ref 39.0–52.0)
Hemoglobin: 11.4 g/dL — ABNORMAL LOW (ref 13.0–17.0)
Lymphocytes Relative: 12 % (ref 12.0–46.0)
Lymphs Abs: 1.4 10*3/uL (ref 0.7–4.0)
MCHC: 31.4 g/dL (ref 30.0–36.0)
MCV: 81 fl (ref 78.0–100.0)
Monocytes Absolute: 0.8 10*3/uL (ref 0.1–1.0)
Monocytes Relative: 7.4 % (ref 3.0–12.0)
Neutro Abs: 8.9 10*3/uL — ABNORMAL HIGH (ref 1.4–7.7)
Neutrophils Relative %: 77.4 % — ABNORMAL HIGH (ref 43.0–77.0)
Platelets: 283 10*3/uL (ref 150.0–400.0)
RBC: 4.47 Mil/uL (ref 4.22–5.81)
RDW: 27.3 % — ABNORMAL HIGH (ref 11.5–15.5)
WBC: 11.5 10*3/uL — ABNORMAL HIGH (ref 4.0–10.5)

## 2023-11-06 NOTE — Progress Notes (Unsigned)
 Anemia follow up.  Still on iron.  Cbc pending.  Still off plavix.  He has hematology f/u pending.    H/o PVD.  Not cramping on walking.  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  He has black stools attributed to iron use.    Done with doxy, cough is getting better.  No fevers.  He feels better than prior.    Meds, vitals, and allergies reviewed.   ROS: Per HPI unless specifically indicated in ROS section   Nad [place]ck supple, no LA rrr SEM noted.  Ctab Abd soft, not ttp. No BLE edema.  Skin well perfused.

## 2023-11-06 NOTE — Patient Instructions (Signed)
 Go to the lab on the way out.   If you have mychart we'll likely use that to update you.    Take care.  Glad to see you. I wouldn't restart plavix yet.

## 2023-11-07 ENCOUNTER — Encounter: Payer: Self-pay | Admitting: Family Medicine

## 2023-11-07 ENCOUNTER — Other Ambulatory Visit: Payer: Self-pay | Admitting: Family Medicine

## 2023-11-07 DIAGNOSIS — D649 Anemia, unspecified: Secondary | ICD-10-CM

## 2023-11-07 NOTE — Assessment & Plan Note (Signed)
 Not cramping on walking.  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  I would not restart plavix at this point.  Would continue baseline BP meds and repatha, d/w pt.

## 2023-11-07 NOTE — Assessment & Plan Note (Signed)
 Continue iron.  Cbc pending.  Still off plavix.  Would not restart plavix now.  He has hematology f/u pending.

## 2023-11-14 ENCOUNTER — Ambulatory Visit: Payer: Self-pay | Admitting: Urology

## 2023-11-14 DIAGNOSIS — N189 Chronic kidney disease, unspecified: Secondary | ICD-10-CM | POA: Diagnosis not present

## 2023-11-14 DIAGNOSIS — D472 Monoclonal gammopathy: Secondary | ICD-10-CM | POA: Diagnosis not present

## 2023-11-29 ENCOUNTER — Other Ambulatory Visit: Payer: Self-pay | Admitting: Family Medicine

## 2023-12-05 ENCOUNTER — Ambulatory Visit: Payer: Medicare Other | Admitting: Urology

## 2023-12-06 ENCOUNTER — Encounter: Payer: Self-pay | Admitting: Urology

## 2023-12-08 ENCOUNTER — Other Ambulatory Visit: Payer: Self-pay | Admitting: Urology

## 2023-12-17 ENCOUNTER — Telehealth: Payer: Self-pay | Admitting: Pharmacy Technician

## 2023-12-17 NOTE — Telephone Encounter (Signed)
 Pharmacy Patient Advocate Encounter   Received notification from CoverMyMeds that prior authorization for repatha is required/requested.   Insurance verification completed.   The patient is insured through [facility] .   Per test claim: PA required; PA submitted to above mentioned insurance via CoverMyMeds Key/confirmation #/EOC [facility] Status is pending

## 2023-12-17 NOTE — Telephone Encounter (Signed)
 Follow Up:     Please call. Concerning patient's Repatha

## 2023-12-17 NOTE — Telephone Encounter (Signed)
 Pharmacy Patient Advocate Encounter  Received notification from [facility] that Prior Authorization for repatha has been APPROVED from 12/17/23 to 12/16/24. Spoke to pharmacy to process.Copay is $47.00.    PA #/Case ID/Reference #: [id number]

## 2023-12-20 ENCOUNTER — Telehealth: Payer: Self-pay

## 2023-12-20 NOTE — Telephone Encounter (Signed)
 Copied from CRM [id number]. Topic: Clinical - Medication Question >> Dec 20, 2023  9:21 AM Adonis Hoot wrote: Reason for CRM: Patient would like to know about going back on the plavix medication?

## 2023-12-20 NOTE — Telephone Encounter (Signed)
 Patient is aware that Dr. Vallarie Gauze in not in office and will return on Monday. I did advise once Dr. Vallarie Gauze responds someone will contact him.

## 2023-12-23 NOTE — Telephone Encounter (Signed)
 I would like to recheck his CBC first.  His most recent HGB wasn't back to normal when checked with hematology.   There is a significant risk of bleeding if he restarts Plavix .  I routed a copy of this note to Dr. Charlotte Cookey to see if it is reasonable to attempt taking Plavix  3 times weekly instead of daily, assuming his hemoglobin is back to normal.  I appreciate the help of all involved.

## 2023-12-24 NOTE — Telephone Encounter (Signed)
 Called and advised patient of Dr. Azell Boll message. Scheduled patient lab appt for repeat CBC. Patient verbalized understanding.

## 2023-12-25 ENCOUNTER — Other Ambulatory Visit (INDEPENDENT_AMBULATORY_CARE_PROVIDER_SITE_OTHER)

## 2023-12-25 DIAGNOSIS — D649 Anemia, unspecified: Secondary | ICD-10-CM

## 2023-12-25 LAB — CBC WITH DIFFERENTIAL/PLATELET
Basophils Absolute: 0.1 10*3/uL (ref 0.0–0.1)
Basophils Relative: 1.2 % (ref 0.0–3.0)
Eosinophils Absolute: 0.2 10*3/uL (ref 0.0–0.7)
Eosinophils Relative: 2.1 % (ref 0.0–5.0)
HCT: 40.6 % (ref 39.0–52.0)
Hemoglobin: 13.5 g/dL (ref 13.0–17.0)
Lymphocytes Relative: 19 % (ref 12.0–46.0)
Lymphs Abs: 2.2 10*3/uL (ref 0.7–4.0)
MCHC: 33.2 g/dL (ref 30.0–36.0)
MCV: 86.9 fl (ref 78.0–100.0)
Monocytes Absolute: 1 10*3/uL (ref 0.1–1.0)
Monocytes Relative: 8.5 % (ref 3.0–12.0)
Neutro Abs: 7.9 10*3/uL — ABNORMAL HIGH (ref 1.4–7.7)
Neutrophils Relative %: 69.2 % (ref 43.0–77.0)
Platelets: 209 10*3/uL (ref 150.0–400.0)
RBC: 4.68 Mil/uL (ref 4.22–5.81)
RDW: 23.4 % — ABNORMAL HIGH (ref 11.5–15.5)
WBC: 11.5 10*3/uL — ABNORMAL HIGH (ref 4.0–10.5)

## 2023-12-26 ENCOUNTER — Other Ambulatory Visit

## 2023-12-26 NOTE — Telephone Encounter (Signed)
 Noted. Thanks.

## 2023-12-27 ENCOUNTER — Ambulatory Visit: Payer: Medicare Other

## 2023-12-27 VITALS — Ht 67.5 in | Wt 150.0 lb

## 2023-12-27 DIAGNOSIS — Z0000 Encounter for general adult medical examination without abnormal findings: Secondary | ICD-10-CM | POA: Diagnosis not present

## 2023-12-27 NOTE — Patient Instructions (Signed)
 [name] , Thank you for taking time to come for your Medicare Wellness Visit. I appreciate your ongoing commitment to your health goals. Please review the following plan we discussed and let me know if I can assist you in the future.   Referrals/Orders/Follow-Ups/Clinician Recommendations: none  This is a list of the screening recommended for you and due dates:  Health Maintenance  Topic Date Due   DTaP/Tdap/Td vaccine (2 - Tdap) 03/12/2022   Flu Shot  04/04/2024   Colon Cancer Screening  10/09/2024   Medicare Annual Wellness Visit  12/26/2024   Pneumonia Vaccine  Completed   Zoster (Shingles) Vaccine  Completed   HPV Vaccine  Aged Out   Meningitis B Vaccine  Aged Out   COVID-19 Vaccine  Discontinued    Advanced directives: (Copy Requested) Please bring a copy of your health care power of attorney and living will to the office to be added to your chart at your convenience. You can mail to [facility] [address] [place], [place] [address] or email to [email or web address]  Next Medicare Annual Wellness Visit scheduled for next year: Yes 12/30/24 @11 :30am televisit

## 2023-12-27 NOTE — Progress Notes (Signed)
 Subjective:   Ryan Pittman is a 83 y.o. who presents for a Medicare Wellness preventive visit.  Visit Complete: Virtual I connected with  Ryan Pittman on 12/27/23 by a audio enabled telemedicine application and verified that I am speaking with the correct person using two identifiers.  Patient Location: Home  Provider Location: Office/Clinic  I discussed the limitations of evaluation and management by telemedicine. The patient expressed understanding and agreed to proceed.  Vital Signs: Because this visit was a virtual/telehealth visit, some criteria may be missing or patient reported. Any vitals not documented were not able to be obtained and vitals that have been documented are patient reported.  VideoDeclined- This patient declined Librarian, academic. Therefore the visit was completed with audio only.  Persons Participating in Visit: Patient.  AWV Questionnaire: No: Patient Medicare AWV questionnaire was not completed prior to this visit.  Cardiac Risk Factors include: advanced age (>7men, >32 women);hypertension;male gender     Objective:    Today's Vitals   12/27/23 1133  Weight: 150 lb (68 kg)  Height: 5' 7.5" (1.715 m)   Body mass index is 23.15 kg/m.     12/27/2023   11:44 AM 09/10/2023    8:21 AM 12/25/2022    3:12 PM 11/28/2022   11:11 AM 08/11/2022   10:59 AM 06/29/2022    6:43 AM 02/08/2022   12:58 PM  Advanced Directives  Does Patient Have a Medical Advance Directive? Yes No No No No No No  Type of Estate agent of [place];Living will        Copy of Healthcare Power of Attorney in Chart? Yes - validated most recent copy scanned in chart (See row information)        Would patient like information on creating a medical advance directive?   No - Patient declined No - Patient declined No - Patient declined No - Patient declined     Current Medications (verified) Outpatient Encounter Medications as of  12/27/2023  Medication Sig   acetaminophen  (TYLENOL ) 500 MG tablet Take 1,000 mg by mouth every 6 (six) hours as needed for moderate pain or headache.   amLODipine  (NORVASC ) 10 MG tablet TAKE 1 TABLET BY MOUTH DAILY   colchicine  0.6 MG tablet Take 1.2mg  (2 tablets) then 0.6mg  (1 tablet) 1 hour after. Then, take 1 tablet every day for 7 days.   Iron , Ferrous Sulfate , 325 (65 Fe) MG TABS Take 325 mg by mouth daily.   levothyroxine  (SYNTHROID ) 75 MCG tablet TAKE 1 TABLET BY MOUTH 6 DAYS A WEEK AND1 & 1/2 TABLETS ONE DAY A WEEK AS DIRECTED.   metoprolol  tartrate (LOPRESSOR ) 25 MG tablet TAKE 1 TABLET BY MOUTH TWICE DAILY   Multiple Vitamins-Minerals (MULTIVITAMIN MEN 50+) TABS Take 1 tablet by mouth in the morning.   pantoprazole  (PROTONIX ) 40 MG tablet Take 1 tablet (40 mg total) by mouth daily.   pramipexole  (MIRAPEX ) 1 MG tablet Take 1 mg by mouth daily as needed.   REPATHA  SURECLICK 140 MG/ML SOAJ INJECT 140MG  EVERY 14 DAYS   tamsulosin  (FLOMAX ) 0.4 MG CAPS capsule Take 1 capsule (0.4 mg total) by mouth daily.   benzonatate  (TESSALON ) 200 MG capsule Take 1 capsule (200 mg total) by mouth 3 (three) times daily as needed. (Patient not taking: Reported on 12/27/2023)   clopidogrel  (PLAVIX ) 75 MG tablet Held as of 09/28/23 (Patient not taking: Reported on 12/27/2023)   hydrochlorothiazide (HYDRODIURIL) 50 MG tablet Take 1 tablet by mouth daily.  No facility-administered encounter medications on file as of 12/27/2023.    Allergies (verified) Penicillins, Ace inhibitors, Aspirin , Celebrex [celecoxib], and Lipitor [atorvastatin]   History: Past Medical History:  Diagnosis Date   Anemia    due to GIB, s/p transfusion   Anginal pain (HCC) 07/10/2018   Arthritis    back pain, much worse after consecutive golf rounds   Cancer [facility])    thyroid    Chronic kidney disease    Colon polyps    Coronary artery disease    COVID-19    Dyspnea    walking up a hill   Dysrhythmia 2020   GERD  (gastroesophageal reflux disease)    Hypercalcemia    h/o, resolved as of 2012, prev due to high amount of calcium intake   Hyperlipidemia    Hypertension    Hypothyroidism    IgG gammopathy    stable as of 2012 per [facility]   Pneumonia    as a child   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 Arlester Ladd   PVD (peripheral vascular disease) (HCC)    L leg bypass, R leg stented   Past Surgical History:  Procedure Laterality Date    dental implant left bottom-did have bleeding      ABDOMINAL AORTOGRAM W/LOWER EXTREMITY Bilateral 06/26/2019   Procedure: ABDOMINAL AORTOGRAM W/LOWER EXTREMITY;  Surgeon: Avanell Leigh, MD;  [facility]: [facility] INVASIVE CV LAB;  Service: Cardiovascular;  Laterality: Bilateral;   ABDOMINAL AORTOGRAM W/LOWER EXTREMITY N/A 06/08/2020   Procedure: ABDOMINAL AORTOGRAM W/LOWER EXTREMITY;  Surgeon: Margherita Shell, MD;  Location: [facility];  Service: Cardiovascular;  Laterality: N/A;   ABDOMINAL AORTOGRAM W/LOWER EXTREMITY N/A 11/28/2022   Procedure: ABDOMINAL AORTOGRAM W/LOWER EXTREMITY;  Surgeon: Margherita Shell, MD;  [facility]: [facility];  Service: Cardiovascular;  Laterality: N/A;   BYPASS GRAFT     L leg   CARDIAC CATHETERIZATION  06/24/2018   COLONOSCOPY N/A 10/09/2021   Procedure: COLONOSCOPY;  Surgeon: Toledo, Alphonsus Jeans, MD;  Location: [facility] ENDOSCOPY;  Service: Gastroenterology;  Laterality: N/A;   COLONOSCOPY WITH PROPOFOL  N/A 05/04/2015   Procedure: COLONOSCOPY WITH PROPOFOL ;  Surgeon: Deveron Fly, MD;  Location: [facility] ENDOSCOPY;  Service: Endoscopy;  Laterality: N/A;   CORONARY ARTERY BYPASS GRAFT N/A 07/11/2018   Procedure: CORONARY ARTERY BYPASS GRAFTING (CABG) x three, using left internal mammary artery and right leg greater saphenous vein harvested endoscopically;  Surgeon: Bartley Lightning, MD;  Location: [facility] OR;  Service: Open Heart Surgery;  Laterality: N/A;   ENDARTERECTOMY FEMORAL  Right 02/13/2020   Procedure: RIGHT ENDARTERECTOMY FEMORAL WITH BOVINE PATCH;  Surgeon: Margherita Shell, MD;  Location: [facility] OR;  Service: Vascular;  Laterality: Right;   ESOPHAGOGASTRODUODENOSCOPY N/A 10/09/2021   Procedure: ESOPHAGOGASTRODUODENOSCOPY (EGD);  Surgeon: Toledo, Alphonsus Jeans, MD;  Location: [facility] ENDOSCOPY;  Service: Gastroenterology;  Laterality: N/A;   GIVENS CAPSULE STUDY N/A 10/09/2021   Procedure: GIVENS CAPSULE STUDY;  Surgeon: Toledo, Alphonsus Jeans, MD;  Location: [facility] ENDOSCOPY;  Service: Gastroenterology;  Laterality: N/A;   LEFT ATRIAL APPENDAGE OCCLUSION N/A 06/29/2022   Procedure: LEFT ATRIAL APPENDAGE OCCLUSION;  Surgeon: Arnoldo Lapping, MD;  [facility] CV LAB;  Service: Cardiovascular;  Laterality: N/A;   LEFT HEART CATH AND CORONARY ANGIOGRAPHY N/A 06/24/2018   Procedure: LEFT HEART CATH AND CORONARY ANGIOGRAPHY;  Surgeon: Avanell Leigh, MD;  [facility];  Service: Cardiovascular;  Laterality: N/A;   LOWER  EXTREMITY ANGIOGRAPHY Bilateral 10/25/2021   Procedure: Lower Extremity Angiography;  Surgeon: Margherita Shell, MD;  Location: [facility] INVASIVE CV LAB;  Service: Cardiovascular;  Laterality: Bilateral;   PERIPHERAL VASCULAR ATHERECTOMY Right 06/08/2020   Procedure: PERIPHERAL VASCULAR ATHERECTOMY;  Surgeon: Margherita Shell, MD;  [facility];  Service: Cardiovascular;  Laterality: Right;  superficial femoral   PERIPHERAL VASCULAR BALLOON ANGIOPLASTY Right 06/08/2020   Procedure: PERIPHERAL VASCULAR BALLOON ANGIOPLASTY;  Surgeon: Margherita Shell, MD;  [facility];  Service: Cardiovascular;  Laterality: Right;  External iliac   PERIPHERAL VASCULAR BALLOON ANGIOPLASTY Right 11/28/2022   Procedure: PERIPHERAL VASCULAR BALLOON ANGIOPLASTY;  Surgeon: Margherita Shell, MD;  [facility];  Service: Cardiovascular;  Laterality: Right;  common illiac   PERIPHERAL VASCULAR INTERVENTION Right 10/25/2021   Procedure:  PERIPHERAL VASCULAR INTERVENTION;  Surgeon: Margherita Shell, MD;  [facility];  Service: Cardiovascular;  Laterality: Right;  SFA   PERIPHERAL VASCULAR INTERVENTION Right 11/28/2022   Procedure: PERIPHERAL VASCULAR INTERVENTION;  Surgeon: Margherita Shell, MD;  [facility];  Service: Cardiovascular;  Laterality: Right;   RENAL ANGIOGRAPHY Bilateral 10/25/2021   Procedure: RENAL ANGIOGRAPHY;  Surgeon: Margherita Shell, MD;  [facility];  Service: Cardiovascular;  Laterality: Bilateral;   TEE WITHOUT CARDIOVERSION N/A 07/11/2018   Procedure: TRANSESOPHAGEAL ECHOCARDIOGRAM (TEE);  Surgeon: Bartley Lightning, MD;  Location: [facility] OR;  Service: Open Heart Surgery;  Laterality: N/A;   TEE WITHOUT CARDIOVERSION N/A 06/29/2022   Procedure: TRANSESOPHAGEAL ECHOCARDIOGRAM (TEE);  Surgeon: Arnoldo Lapping, MD;  Location: [facility] INVASIVE CV LAB;  Service: Cardiovascular;  Laterality: N/A;   TEE WITHOUT CARDIOVERSION N/A 08/11/2022   Procedure: TRANSESOPHAGEAL ECHOCARDIOGRAM (TEE);  Surgeon: Luana Rumple, MD;  Location: [facility] ENDOSCOPY;  Service: Cardiovascular;  Laterality: N/A;   THYROIDECTOMY, PARTIAL  2016   VASCULAR SURGERY     Family History  Problem Relation Age of Onset   Diabetes Mother    Stroke Father    Colon cancer Neg Hx    Prostate cancer Neg Hx    Social History   Socioeconomic History   Marital status: Married    Spouse name: Not on file   Number of children: Not on file   Years of education: Not on file   Highest education level: GED or equivalent  Occupational History   Not on file  Tobacco Use   Smoking status: Former    Current packs/day: 0.00    Types: Cigarettes    Quit date: 09/04/2008    Years since quitting: 15.3    Passive exposure: Never   Smokeless tobacco: Former    Types: Chew   Tobacco comments:    HAs quit tobacco products 2009  Vaping Use   Vaping status: Never Used  Substance and Sexual Activity   Alcohol use: Yes     Comment: occ   Drug use: No   Sexual activity: Yes  Other Topics Concern   Not on file  Social History Narrative   Married 50+ years, 2 kids   Runs [employer]   Likes to play golf   Social Drivers of Health   Financial Resource Strain: Low Risk  (12/27/2023)   Overall Financial Resource Strain (CARDIA)    Difficulty of Paying Living Expenses: Not hard at all  Food Insecurity: No Food Insecurity (12/27/2023)   Hunger Vital Sign    Worried About Running Out of Food in the Last  Year: Never true    Ran Out of Food in the Last Year: Never true  Transportation Needs: No Transportation Needs (12/27/2023)   PRAPARE - Administrator, Civil Service (Medical): No    Lack of Transportation (Non-Medical): No  Physical Activity: Insufficiently Active (12/27/2023)   Exercise Vital Sign    Days of Exercise per Week: 3 days    Minutes of Exercise per Session: 40 min  Stress: No Stress Concern Present (12/27/2023)   [employer] of Occupational Health - Occupational Stress Questionnaire    Feeling of Stress : Not at all  Social Connections: Moderately Integrated (12/27/2023)   Social Connection and Isolation Panel [NHANES]    Frequency of Communication with Friends and Family: More than three times a week    Frequency of Social Gatherings with Friends and Family: More than three times a week    Attends Religious Services: More than 4 times per year    Active Member of [employer] or Organizations: No    Attends Banker Meetings: Never    Marital Status: Married    Tobacco Counseling Counseling given: Not Answered Tobacco comments: HAs quit tobacco products 2009    Clinical Intake:  Pre-visit preparation completed: Yes  Pain : No/denies pain     BMI - recorded: 23.15 Nutritional Status: BMI of 19-24  Normal Nutritional Risks: None Diabetes: No  Lab Results  Component Value Date   HGBA1C 5.2 10/23/2022   HGBA1C 6.1 (H) 07/14/2019   HGBA1C 5.7 (H)  07/10/2018     How often do you need to have someone help you when you read instructions, pamphlets, or other written materials from your doctor or pharmacy?: 1 - Never  Interpreter Needed?: No  Comments: lives with wife Information entered by :: B.Cameryn Schum,LPN   Activities of Daily Living     12/27/2023   11:44 AM  In your present state of health, do you have any difficulty performing the following activities:  Hearing? 0  Vision? 0  Difficulty concentrating or making decisions? 0  Walking or climbing stairs? 0  Dressing or bathing? 0  Doing errands, shopping? 0  Preparing Food and eating ? N  Using the Toilet? N  In the past six months, have you accidently leaked urine? N  Do you have problems with loss of bowel control? N  Managing your Medications? N  Managing your Finances? N  Housekeeping or managing your Housekeeping? N    Patient Care Team: Donnie Galea, MD as PCP - General (Family Medicine) Avanell Leigh, MD as PCP - Cardiology (Cardiology) Bary Boss., MD as Consulting Physician (Ophthalmology) Kizzie Perks. Cherlynn Cornfield, MD as Consulting Physician (Urology) Gene Kemps, MS, CCC-A as Consulting Physician (Audiology) Eduard Grad, Chet Cota, NP as Nurse Practitioner (Cardiology) Jonathan Neighbor, [facility] (Inactive) as Pharmacist (Pharmacist) Toledo, Teodoro K, MD as Consulting Physician (Gastroenterology) Timmy Forbes, MD as Consulting Physician (Oncology) Erskin Hearing, MD as Consulting Physician (Pulmonary Disease)  Indicate any recent Medical Services you may have received from other than [facility] providers in the past year (date may be approximate).     Assessment:   This is a routine wellness examination for Ryan Pittman.  Hearing/Vision screen Hearing Screening - Comments:: Pt says he hears good Vision Screening - Comments:: Pt says his vision is good  Pt says no eye exam in awhile:declines referral   Goals Addressed               This Visit's Progress  DIET - EAT MORE FRUITS AND VEGETABLES   On track     12/27/23      Increase physical activity (pt-stated)   On track     12/27/23-pt continues to :  I will continue to golf for at least 4 hrs 1-2 days per week and limit intake of saturated fat.       Track and Manage My Blood Pressure-Hypertension   On track     Timeframe:  Long-Range Goal Priority:  High Start Date:            09/28/21                 Expected End Date:        09/28/22               Follow Up Date July 2023  - check blood pressure daily - choose a place to take my blood pressure (home, clinic or office, retail store) - write blood pressure results in a log or diary    Why is this important?   You won't feel high blood pressure, but it can still hurt your blood vessels.  High blood pressure can cause heart or kidney problems. It can also cause a stroke.  Making lifestyle changes like losing a little weight or eating less salt will help.  Checking your blood pressure at home and at different times of the day can help to control blood pressure.  If the doctor prescribes medicine remember to take it the way the doctor ordered.  Call the office if you cannot afford the medicine or if there are questions about it.     Notes:        Depression Screen     12/27/2023   11:41 AM 11/06/2023    9:09 AM 10/23/2023    9:09 AM 02/06/2023    4:06 PM 12/25/2022    3:10 PM 12/22/2021    3:32 PM 12/21/2020   12:02 PM  PHQ 2/9 Scores  PHQ - 2 Score 0 0 0 0 0 0 0  PHQ- 9 Score  0 0 3   0    Fall Risk     12/27/2023   11:36 AM 11/06/2023    9:09 AM 10/23/2023    9:08 AM 02/06/2023    4:06 PM 12/25/2022    3:03 PM  Fall Risk   Falls in the past year? 0 0 0 0 0  Number falls in past yr: 0 0 0 0 0  Injury with Fall? 0 0 0 0 0  Risk for fall due to : No Fall Risks No Fall Risks No Fall Risks No Fall Risks No Fall Risks  Follow up Education provided;Falls prevention discussed Falls evaluation completed  Falls evaluation completed Falls  prevention discussed;Falls evaluation completed    MEDICARE RISK AT HOME:  Medicare Risk at Home Any stairs in or around the home?: Yes If so, are there any without handrails?: Yes Home free of loose throw rugs in walkways, pet beds, electrical cords, etc?: Yes Adequate lighting in your home to reduce risk of falls?: Yes Life alert?: No Use of a cane, walker or w/c?: No Grab bars in the bathroom?: No Shower chair or bench in shower?: Yes (does not use) Elevated toilet seat or a handicapped toilet?: No  TIMED UP AND GO:  Was the test performed?  No  Cognitive Function: 6CIT completed    12/21/2020   12:05 PM 04/04/2019    9:51  AM 03/27/2018   10:46 AM 03/22/2017    8:52 AM 12/24/2015    9:34 AM  MMSE - Mini Mental State Exam  Orientation to time 5 4 5 5 5   Orientation to Place 5 5 5 5 5   Registration 3 3 3 3 3   Attention/ Calculation 5 0 0 0 0  Recall 3 3 3 3 3   Language- name 2 objects  0 0 0 0  Language- repeat 1 0 1 1 1   Language- follow 3 step command  0 3 3 3   Language- read & follow direction  0 0 0 0  Write a sentence  0 0 0 0  Copy design  0 0 0 0  Total score  15 20 20 20         12/27/2023   11:45 AM 12/25/2022    3:13 PM  6CIT Screen  What Year? 0 points 0 points  What month? 0 points 0 points  What time? 0 points 0 points  Count back from 20 0 points 0 points  Months in reverse 4 points 0 points  Repeat phrase 2 points 2 points  Total Score 6 points 2 points    Immunizations Immunization History  Administered Date(s) Administered   Fluad Quad(high Dose 65+) 07/25/2022   Influenza, High Dose Seasonal PF 05/19/2016, 05/19/2019, 06/01/2020, 07/25/2022, 07/10/2023   Influenza-Unspecified 06/18/2014, 07/02/2017, 06/12/2018   Moderna SARS-COV2 Booster Vaccination 08/03/2020   Moderna Sars-Covid-2 Vaccination 10/18/2019, 11/15/2019   Pneumococcal Conjugate-13 04/07/2014   Pneumococcal Polysaccharide-23 04/28/2010   Td 03/12/2012   Zoster  Recombinant(Shingrix) 06/14/2022, 10/31/2022   Zoster, Live 06/07/2010, 08/13/2010    Screening Tests Health Maintenance  Topic Date Due   DTaP/Tdap/Td (2 - Tdap) 03/12/2022   INFLUENZA VACCINE  04/04/2024   Colonoscopy  10/09/2024   Medicare Annual Wellness (AWV)  12/26/2024   Pneumonia Vaccine 77+ Years old  Completed   Zoster Vaccines- Shingrix  Completed   HPV VACCINES  Aged Out   Meningococcal B Vaccine  Aged Out   COVID-19 Vaccine  Discontinued    Health Maintenance  Health Maintenance Due  Topic Date Due   DTaP/Tdap/Td (2 - Tdap) 03/12/2022   Health Maintenance Items Addressed: Patient declined referral to establish with an ophthalmologist.    Additional Screening:  Vision Screening: Recommended annual ophthalmology exams for early detection of glaucoma and other disorders of the eye.  Dental Screening: Recommended annual dental exams for proper oral hygiene  Community Resource Referral / Chronic Care Management: CRR required this visit?  No   CCM required this visit?  No    Plan:     I have personally reviewed and noted the following in the patient's chart:   Medical and social history Use of alcohol, tobacco or illicit drugs  Current medications and supplements including opioid prescriptions. Patient is not currently taking opioid prescriptions. Functional ability and status Nutritional status Physical activity Advanced directives List of other physicians Hospitalizations, surgeries, and ER visits in previous 12 months Vitals Screenings to include cognitive, depression, and falls Referrals and appointments  In addition, I have reviewed and discussed with patient certain preventive protocols, quality metrics, and best practice recommendations. A written personalized care plan for preventive services as well as general preventive health recommendations were provided to patient.    Nerissa Bannister, LPN   1/61/0960   After Visit Summary: (MyChart)  Due to this being a telephonic visit, the after visit summary with patients personalized plan was offered to patient via  MyChart   Notes: Nothing significant to report at this time.

## 2024-01-03 DIAGNOSIS — Z85828 Personal history of other malignant neoplasm of skin: Secondary | ICD-10-CM | POA: Diagnosis not present

## 2024-01-03 DIAGNOSIS — D2371 Other benign neoplasm of skin of right lower limb, including hip: Secondary | ICD-10-CM | POA: Diagnosis not present

## 2024-01-03 DIAGNOSIS — D2262 Melanocytic nevi of left upper limb, including shoulder: Secondary | ICD-10-CM | POA: Diagnosis not present

## 2024-01-03 DIAGNOSIS — D2261 Melanocytic nevi of right upper limb, including shoulder: Secondary | ICD-10-CM | POA: Diagnosis not present

## 2024-01-03 DIAGNOSIS — D2272 Melanocytic nevi of left lower limb, including hip: Secondary | ICD-10-CM | POA: Diagnosis not present

## 2024-01-03 DIAGNOSIS — L821 Other seborrheic keratosis: Secondary | ICD-10-CM | POA: Diagnosis not present

## 2024-01-03 DIAGNOSIS — Z08 Encounter for follow-up examination after completed treatment for malignant neoplasm: Secondary | ICD-10-CM | POA: Diagnosis not present

## 2024-01-03 DIAGNOSIS — D225 Melanocytic nevi of trunk: Secondary | ICD-10-CM | POA: Diagnosis not present

## 2024-01-03 DIAGNOSIS — L570 Actinic keratosis: Secondary | ICD-10-CM | POA: Diagnosis not present

## 2024-01-03 DIAGNOSIS — D485 Neoplasm of uncertain behavior of skin: Secondary | ICD-10-CM | POA: Diagnosis not present

## 2024-01-03 DIAGNOSIS — D2271 Melanocytic nevi of right lower limb, including hip: Secondary | ICD-10-CM | POA: Diagnosis not present

## 2024-01-04 ENCOUNTER — Encounter: Payer: Self-pay | Admitting: Family Medicine

## 2024-01-07 ENCOUNTER — Ambulatory Visit: Payer: Medicare Other | Attending: Surgery | Admitting: Physician Assistant

## 2024-01-07 ENCOUNTER — Ambulatory Visit (HOSPITAL_COMMUNITY)
Admission: RE | Admit: 2024-01-07 | Discharge: 2024-01-07 | Disposition: A | Discharge status: Home / Self Care | Payer: Medicare Other | Source: Ambulatory Visit | Attending: Surgery | Admitting: Surgery

## 2024-01-07 ENCOUNTER — Ambulatory Visit (HOSPITAL_BASED_OUTPATIENT_CLINIC_OR_DEPARTMENT_OTHER)
Admission: RE | Admit: 2024-01-07 | Discharge: 2024-01-07 | Disposition: A | Discharge status: Home / Self Care | Payer: Medicare Other | Source: Ambulatory Visit | Attending: Surgery | Admitting: Surgery

## 2024-01-07 ENCOUNTER — Other Ambulatory Visit: Payer: Self-pay

## 2024-01-07 VITALS — BP 145/69 | HR 64 | Temp 98.0°F | Wt 151.7 lb

## 2024-01-07 DIAGNOSIS — I739 Peripheral vascular disease, unspecified: Secondary | ICD-10-CM

## 2024-01-07 DIAGNOSIS — I70213 Atherosclerosis of native arteries of extremities with intermittent claudication, bilateral legs: Secondary | ICD-10-CM

## 2024-01-07 DIAGNOSIS — T82398A Other mechanical complication of other vascular grafts, initial encounter: Secondary | ICD-10-CM | POA: Insufficient documentation

## 2024-01-07 LAB — VAS US ABI WITH/WO TBI

## 2024-01-07 NOTE — H&P (View-Only) (Signed)
 Office Note   History of Present Illness   Ryan Pittman is a 83 y.o. (Feb 04, 1941) male who presents for surveillance of PAD.  He has a history of the following procedures:  1) Left femoral to below-knee popliteal bypass graft by Dr. Vonna Guardian 2) 02/13/2020: Right iliofemoral endarterectomy with bovine patch angioplasty  3) 06/08/2020: Drug-coated balloon angioplasty right common and external iliac artery.  Atherectomy with DCB, right superficial femoral artery  4) 10/25/2021: Right superficial femoral artery stent  5) 11/28/2022: Stent right popliteal artery, right external iliac artery, balloon angioplasty right common iliac artery   He returns today for follow-up.  He says that he is doing well.  Currently he is not taking any aspirin  or Plavix .  This was discontinued by his PCP for questionable GI bleed with anemia.  He says that his hemoglobin has improved as a recent and his PCP wants to try restarting Plavix  every other day.  He denies any claudication, rest pain, or tissue loss.  Current Outpatient Medications  Medication Sig Dispense Refill   acetaminophen  (TYLENOL ) 500 MG tablet Take 1,000 mg by mouth every 6 (six) hours as needed for moderate pain or headache.     amLODipine  (NORVASC ) 10 MG tablet TAKE 1 TABLET BY MOUTH DAILY 90 tablet 1   benzonatate  (TESSALON ) 200 MG capsule Take 1 capsule (200 mg total) by mouth 3 (three) times daily as needed. (Patient not taking: Reported on 12/27/2023) 30 capsule 1   clopidogrel  (PLAVIX ) 75 MG tablet Held as of 09/28/23 (Patient not taking: Reported on 12/27/2023)     colchicine  0.6 MG tablet Take 1.2mg  (2 tablets) then 0.6mg  (1 tablet) 1 hour after. Then, take 1 tablet every day for 7 days. 10 tablet 2   hydrochlorothiazide (HYDRODIURIL) 50 MG tablet Take 1 tablet by mouth daily.     Iron , Ferrous Sulfate , 325 (65 Fe) MG TABS Take 325 mg by mouth daily.     levothyroxine  (SYNTHROID ) 75 MCG tablet TAKE 1 TABLET BY MOUTH 6 DAYS A WEEK AND1 & 1/2  TABLETS ONE DAY A WEEK AS DIRECTED. 100 tablet 3   metoprolol  tartrate (LOPRESSOR ) 25 MG tablet TAKE 1 TABLET BY MOUTH TWICE DAILY 180 tablet 3   Multiple Vitamins-Minerals (MULTIVITAMIN MEN 50+) TABS Take 1 tablet by mouth in the morning.     pantoprazole  (PROTONIX ) 40 MG tablet Take 1 tablet (40 mg total) by mouth daily. 90 tablet 3   pramipexole  (MIRAPEX ) 1 MG tablet Take 1 mg by mouth daily as needed.     REPATHA  SURECLICK 140 MG/ML SOAJ INJECT 140MG  EVERY 14 DAYS 2 mL 11   tamsulosin  (FLOMAX ) 0.4 MG CAPS capsule Take 1 capsule (0.4 mg total) by mouth daily. 90 capsule 3   No current facility-administered medications for this visit.    REVIEW OF SYSTEMS (negative unless checked):   Cardiac:  []  Chest pain or chest pressure? []  Shortness of breath upon activity? []  Shortness of breath when lying flat? []  Irregular heart rhythm?  Vascular:  []  Pain in calf, thigh, or hip brought on by walking? []  Pain in feet at night that wakes you up from your sleep? []  Blood clot in your veins? []  Leg swelling?  Pulmonary:  []  Oxygen  at home? []  Productive cough? []  Wheezing?  Neurologic:  []  Sudden weakness in arms or legs? []  Sudden numbness in arms or legs? []  Sudden onset of difficult speaking or slurred speech? []  Temporary loss of vision in one eye? []  Problems with  dizziness?  Gastrointestinal:  []  Blood in stool? []  Vomited blood?  Genitourinary:  []  Burning when urinating? []  Blood in urine?  Psychiatric:  []  Major depression  Hematologic:  []  Bleeding problems? []  Problems with blood clotting?  Dermatologic:  []  Rashes or ulcers?  Constitutional:  []  Fever or chills?  Ear/Nose/Throat:  []  Change in hearing? []  Nose bleeds? []  Sore throat?  Musculoskeletal:  []  Back pain? []  Joint pain? []  Muscle pain?   Physical Examination   Vitals:   01/07/24 1359  BP: (!) 145/69  Pulse: 64  Temp: 98 F (36.7 C)  TempSrc: Temporal  SpO2: 98%  Weight: 151  lb 11.2 oz (68.8 kg)   Body mass index is 23.41 kg/m.  General:  WDWN in NAD; vital signs documented above Gait: Not observed HENT: WNL, normocephalic Pulmonary: normal non-labored breathing , without rales, rhonchi,  wheezing Cardiac: regular Abdomen: soft, NT, no masses Skin: without rashes Vascular Exam/Pulses: Palpable femoral pulses.  Brisk DP/PT Doppler signals bilaterally Extremities: without ischemic changes, without gangrene , without cellulitis; without open wounds;  Musculoskeletal: no muscle wasting or atrophy  Neurologic: A&O X 3;  No focal weakness or paresthesias are detected Psychiatric:  The pt has Normal affect.  Non-Invasive Vascular imaging   ABI (01/07/2024) R:  ABI: [place] ([place]),  PT: bi DP: bi TBI:  0.41 L:  ABI: [place] ([place]),  PT: bi DP: bi TBI: 0.32   BLE Arterial Duplex (01/07/2024) Patent right SFA/pop stent.  Patent left femoropopliteal bypass graft without hemodynamically significant stenosis.  Sluggish velocities within the bypass graft at the distal anastomosis, 31 cm/s.   Medical Decision Making   Ryan Pittman is a 83 y.o. male who presents for surveillance of PAD  Based on the patient's vascular studies, his ABIs remain noncompressible. He has biphasic flow into the feet bilaterally Arterial duplex of BLE demonstrates a patent right SFA/pop stent without stenosis.  His left femoropopliteal bypass graft is patent, however there are sluggish velocities at the distal anastomosis, which could threaten the bypass graft He has palpable femoral pulses and brisk DP/PT doppler signals bilaterally He denies any rest pain, claudication, or tissue loss.  Currently he is not taking aspirin  or Plavix  due to possible recent GI bleed with anemia.  His hemoglobin is stabilized.  He has no more melena.  He is stable from a vascular standpoint to attempt taking Plavix  every other day. I have explained to the patient that he would benefit from abdominal aortogram with  left lower extremity runoff and possible intervention on his femoropopliteal bypass.  There are some sluggish velocities within the bypass graft, which ultimately could put the graft at risk for occlusion.  He is agreeable for angiogram.  We will plan for angiogram within the next couple of weeks with Dr. Bernadene Brewer PA-C [facility] Office: [phone]  Clinic MD: Charlotte Cookey

## 2024-01-07 NOTE — Progress Notes (Signed)
 Office Note   History of Present Illness   Ryan Pittman is a 83 y.o. (Feb 04, 1941) male who presents for surveillance of PAD.  He has a history of the following procedures:  1) Left femoral to below-knee popliteal bypass graft by Dr. Vonna Guardian 2) 02/13/2020: Right iliofemoral endarterectomy with bovine patch angioplasty  3) 06/08/2020: Drug-coated balloon angioplasty right common and external iliac artery.  Atherectomy with DCB, right superficial femoral artery  4) 10/25/2021: Right superficial femoral artery stent  5) 11/28/2022: Stent right popliteal artery, right external iliac artery, balloon angioplasty right common iliac artery   He returns today for follow-up.  He says that he is doing well.  Currently he is not taking any aspirin  or Plavix .  This was discontinued by his PCP for questionable GI bleed with anemia.  He says that his hemoglobin has improved as a recent and his PCP wants to try restarting Plavix  every other day.  He denies any claudication, rest pain, or tissue loss.  Current Outpatient Medications  Medication Sig Dispense Refill   acetaminophen  (TYLENOL ) 500 MG tablet Take 1,000 mg by mouth every 6 (six) hours as needed for moderate pain or headache.     amLODipine  (NORVASC ) 10 MG tablet TAKE 1 TABLET BY MOUTH DAILY 90 tablet 1   benzonatate  (TESSALON ) 200 MG capsule Take 1 capsule (200 mg total) by mouth 3 (three) times daily as needed. (Patient not taking: Reported on 12/27/2023) 30 capsule 1   clopidogrel  (PLAVIX ) 75 MG tablet Held as of 09/28/23 (Patient not taking: Reported on 12/27/2023)     colchicine  0.6 MG tablet Take 1.2mg  (2 tablets) then 0.6mg  (1 tablet) 1 hour after. Then, take 1 tablet every day for 7 days. 10 tablet 2   hydrochlorothiazide (HYDRODIURIL) 50 MG tablet Take 1 tablet by mouth daily.     Iron , Ferrous Sulfate , 325 (65 Fe) MG TABS Take 325 mg by mouth daily.     levothyroxine  (SYNTHROID ) 75 MCG tablet TAKE 1 TABLET BY MOUTH 6 DAYS A WEEK AND1 & 1/2  TABLETS ONE DAY A WEEK AS DIRECTED. 100 tablet 3   metoprolol  tartrate (LOPRESSOR ) 25 MG tablet TAKE 1 TABLET BY MOUTH TWICE DAILY 180 tablet 3   Multiple Vitamins-Minerals (MULTIVITAMIN MEN 50+) TABS Take 1 tablet by mouth in the morning.     pantoprazole  (PROTONIX ) 40 MG tablet Take 1 tablet (40 mg total) by mouth daily. 90 tablet 3   pramipexole  (MIRAPEX ) 1 MG tablet Take 1 mg by mouth daily as needed.     REPATHA  SURECLICK 140 MG/ML SOAJ INJECT 140MG  EVERY 14 DAYS 2 mL 11   tamsulosin  (FLOMAX ) 0.4 MG CAPS capsule Take 1 capsule (0.4 mg total) by mouth daily. 90 capsule 3   No current facility-administered medications for this visit.    REVIEW OF SYSTEMS (negative unless checked):   Cardiac:  []  Chest pain or chest pressure? []  Shortness of breath upon activity? []  Shortness of breath when lying flat? []  Irregular heart rhythm?  Vascular:  []  Pain in calf, thigh, or hip brought on by walking? []  Pain in feet at night that wakes you up from your sleep? []  Blood clot in your veins? []  Leg swelling?  Pulmonary:  []  Oxygen  at home? []  Productive cough? []  Wheezing?  Neurologic:  []  Sudden weakness in arms or legs? []  Sudden numbness in arms or legs? []  Sudden onset of difficult speaking or slurred speech? []  Temporary loss of vision in one eye? []  Problems with  dizziness?  Gastrointestinal:  []  Blood in stool? []  Vomited blood?  Genitourinary:  []  Burning when urinating? []  Blood in urine?  Psychiatric:  []  Major depression  Hematologic:  []  Bleeding problems? []  Problems with blood clotting?  Dermatologic:  []  Rashes or ulcers?  Constitutional:  []  Fever or chills?  Ear/Nose/Throat:  []  Change in hearing? []  Nose bleeds? []  Sore throat?  Musculoskeletal:  []  Back pain? []  Joint pain? []  Muscle pain?   Physical Examination   Vitals:   01/07/24 1359  BP: (!) 145/69  Pulse: 64  Temp: 98 F (36.7 C)  TempSrc: Temporal  SpO2: 98%  Weight: 151  lb 11.2 oz (68.8 kg)   Body mass index is 23.41 kg/m.  General:  WDWN in NAD; vital signs documented above Gait: Not observed HENT: WNL, normocephalic Pulmonary: normal non-labored breathing , without rales, rhonchi,  wheezing Cardiac: regular Abdomen: soft, NT, no masses Skin: without rashes Vascular Exam/Pulses: Palpable femoral pulses.  Brisk DP/PT Doppler signals bilaterally Extremities: without ischemic changes, without gangrene , without cellulitis; without open wounds;  Musculoskeletal: no muscle wasting or atrophy  Neurologic: A&O X 3;  No focal weakness or paresthesias are detected Psychiatric:  The pt has Normal affect.  Non-Invasive Vascular imaging   ABI (01/07/2024) R:  ABI: [place] ([place]),  PT: bi DP: bi TBI:  0.41 L:  ABI: [place] ([place]),  PT: bi DP: bi TBI: 0.32   BLE Arterial Duplex (01/07/2024) Patent right SFA/pop stent.  Patent left femoropopliteal bypass graft without hemodynamically significant stenosis.  Sluggish velocities within the bypass graft at the distal anastomosis, 31 cm/s.   Medical Decision Making   Ryan Pittman is a 83 y.o. male who presents for surveillance of PAD  Based on the patient's vascular studies, his ABIs remain noncompressible. He has biphasic flow into the feet bilaterally Arterial duplex of BLE demonstrates a patent right SFA/pop stent without stenosis.  His left femoropopliteal bypass graft is patent, however there are sluggish velocities at the distal anastomosis, which could threaten the bypass graft He has palpable femoral pulses and brisk DP/PT doppler signals bilaterally He denies any rest pain, claudication, or tissue loss.  Currently he is not taking aspirin  or Plavix  due to possible recent GI bleed with anemia.  His hemoglobin is stabilized.  He has no more melena.  He is stable from a vascular standpoint to attempt taking Plavix  every other day. I have explained to the patient that he would benefit from abdominal aortogram with  left lower extremity runoff and possible intervention on his femoropopliteal bypass.  There are some sluggish velocities within the bypass graft, which ultimately could put the graft at risk for occlusion.  He is agreeable for angiogram.  We will plan for angiogram within the next couple of weeks with Dr. Bernadene Brewer PA-C [facility] Office: [phone]  Clinic MD: Charlotte Cookey

## 2024-01-08 ENCOUNTER — Telehealth: Payer: Self-pay

## 2024-01-08 DIAGNOSIS — D649 Anemia, unspecified: Secondary | ICD-10-CM

## 2024-01-08 NOTE — Telephone Encounter (Signed)
 Copied from CRM [id number]. Topic: Clinical - Medical Advice >> Jan 07, 2024  2:51 PM Adaysia C wrote: Reason for CRM: Patients wife has requested a follow up call regarding blood thinners because the patient was [facility]; please follow up with patients wife(Carol) [phone]

## 2024-01-09 DIAGNOSIS — N1831 Chronic kidney disease, stage 3a: Secondary | ICD-10-CM | POA: Diagnosis not present

## 2024-01-09 DIAGNOSIS — I10 Essential (primary) hypertension: Secondary | ICD-10-CM | POA: Diagnosis not present

## 2024-01-09 DIAGNOSIS — R809 Proteinuria, unspecified: Secondary | ICD-10-CM | POA: Diagnosis not present

## 2024-01-09 DIAGNOSIS — D631 Anemia in chronic kidney disease: Secondary | ICD-10-CM | POA: Diagnosis not present

## 2024-01-09 MED ORDER — BENZONATATE 200 MG PO CAPS
200.0000 mg | ORAL_CAPSULE | Freq: Three times a day (TID) | ORAL | 1 refills | Status: DC | PRN
Start: 2024-01-09 — End: 2024-06-10

## 2024-01-09 MED ORDER — CLOPIDOGREL BISULFATE 75 MG PO TABS: 75.0000 mg | ORAL_TABLET | ORAL | Status: DC

## 2024-01-09 NOTE — Addendum Note (Signed)
 Addended by: Donnie Galea on: 01/09/2024 02:42 PM   Modules accepted: Orders

## 2024-01-09 NOTE — Telephone Encounter (Signed)
 I sent a prescription for Tessalon  for the cough.  If he is getting worse in the meantime then I think it makes sense to get checked re: cough.   If he wants to push the other visit back until after he has the labs, then that is fine with me.  Thanks.

## 2024-01-09 NOTE — Telephone Encounter (Signed)
 Advised patient that he can restart the plavix  and what days to take. Patient verbalized understand. He also states that he has developed a chest cold with yellow phlegm. He is wondering can some cough medicine be sent. He will call back to schedule lab appointment. He also scheduled to see you on /5/12 for vein and vascular follow up. Does that appointment need to be pushed back since he needs labs

## 2024-01-09 NOTE — Telephone Encounter (Signed)
 Please let her know that I am asking for vascular input.  I routed this note to [facility].     ================== I need your input.  Is it reasonable for this patient to attempt taking Plavix  3 times weekly instead of daily, since his hemoglobin is back to normal.  Please let me know about your preferences.  Thanks.

## 2024-01-09 NOTE — Telephone Encounter (Signed)
 See below.  Please update patient's wife.  Would restart Plavix  on Monday Wednesday Friday.  He would only take 3 tablets/week.  If he has any black or bloody stools then stop it and let us  know.  I would like to recheck his CBC in about 2 weeks.  He needs a nonfasting lab visit.  I put in the order.  Thanks.  ================ Schuh, [employer], PA-C  Donnie Galea, MD Caller: Unspecified (2 days ago,  2:40 PM) He should be fine to resume his plavix  3x weekly.   Thank you, McKenzi Schuh PA-C

## 2024-01-09 NOTE — Addendum Note (Signed)
 Addended by: Donnie Galea on: 01/09/2024 06:23 PM   Modules accepted: Orders

## 2024-01-10 ENCOUNTER — Telehealth: Payer: Self-pay

## 2024-01-10 NOTE — Telephone Encounter (Signed)
 Copied from CRM [id number]. Topic: General - Other >> Jan 10, 2024  9:13 AM [place] E wrote: Reason for CRM: Patient's wife, Sheryle Donning, called in just wanting clarification on appointment rescheduling. Sheryle Donning stated that the patient's appointment for 5/12 got cancelled and they were wanting that appointment before his surgery that is on May 13th. Sheryle Donning was just wanting clarification on this. Callback number for wife is [phone] to discuss.

## 2024-01-10 NOTE — Telephone Encounter (Signed)
 Copied from CRM [id number]. Topic: General - Other >> Jan 10, 2024  8:46 AM Ryan Pittman wrote: Reason for CRM: Patient returned call to office, agent advised patient of message as stated and scheduled patient for his lab and appointment with Dr. Vallarie Gauze for 5/20 per notes.

## 2024-01-10 NOTE — Telephone Encounter (Signed)
 Left message for patient to return call. I was calling to advise that Dr. Vallarie Gauze did  send in Tessalon  for his cough. Also to see about scheduling his lab appt in two weeks and moving the appointment that he had scheduled on the 12 back to coincide with his lab appointment(one trip)

## 2024-01-11 NOTE — Telephone Encounter (Signed)
 Patients spouse returned call to office. I did advise why the appointment was changed. She understood after the discussion. She states that the Tessalon  does not help with her spouse cough and was wondering if something else can be sent.

## 2024-01-11 NOTE — Telephone Encounter (Signed)
 Noted. Thanks.

## 2024-01-11 NOTE — Telephone Encounter (Signed)
 Left message for patients spouse Sheryle Donning who is on DPR to return call

## 2024-01-11 NOTE — Telephone Encounter (Signed)
 Spoke with patients spouse and they are going out of town today. Unable to make an appointment. But she states that the cough is much better so she is not that concerned.

## 2024-01-11 NOTE — Telephone Encounter (Signed)
 I think he needs to get checked re: cough if not better with tessalon  or if worse.

## 2024-01-14 ENCOUNTER — Ambulatory Visit: Admitting: Family Medicine

## 2024-01-15 ENCOUNTER — Other Ambulatory Visit: Payer: Self-pay

## 2024-01-15 ENCOUNTER — Encounter (HOSPITAL_COMMUNITY)
Admission: RE | Disposition: A | Discharge status: Home / Self Care | Payer: Self-pay | Source: Home / Self Care | Attending: Surgery

## 2024-01-15 ENCOUNTER — Encounter (HOSPITAL_COMMUNITY): Payer: Self-pay | Admitting: Surgery

## 2024-01-15 ENCOUNTER — Ambulatory Visit (HOSPITAL_COMMUNITY)
Admission: RE | Admit: 2024-01-15 | Discharge: 2024-01-15 | Disposition: A | Discharge status: Home / Self Care | Attending: Surgery | Admitting: Surgery

## 2024-01-15 DIAGNOSIS — I739 Peripheral vascular disease, unspecified: Secondary | ICD-10-CM | POA: Insufficient documentation

## 2024-01-15 DIAGNOSIS — Z7902 Long term (current) use of antithrombotics/antiplatelets: Secondary | ICD-10-CM | POA: Insufficient documentation

## 2024-01-15 DIAGNOSIS — Y832 Surgical operation with anastomosis, bypass or graft as the cause of abnormal reaction of the patient, or of later complication, without mention of misadventure at the time of the procedure: Secondary | ICD-10-CM | POA: Insufficient documentation

## 2024-01-15 DIAGNOSIS — T82858A Stenosis of vascular prosthetic devices, implants and grafts, initial encounter: Secondary | ICD-10-CM | POA: Insufficient documentation

## 2024-01-15 DIAGNOSIS — I70213 Atherosclerosis of native arteries of extremities with intermittent claudication, bilateral legs: Secondary | ICD-10-CM

## 2024-01-15 HISTORY — PX: ABDOMINAL AORTOGRAM: CATH118222

## 2024-01-15 HISTORY — PX: LOWER EXTREMITY ANGIOGRAPHY: CATH118251

## 2024-01-15 HISTORY — PX: LOWER EXTREMITY INTERVENTION: CATH118252

## 2024-01-15 LAB — POCT I-STAT, CHEM 8
BUN: 27 mg/dL — ABNORMAL HIGH (ref 8–23)
Calcium, Ion: 1.27 mmol/L (ref 1.15–1.40)
Chloride: 103 mmol/L (ref 98–111)
Creatinine, Ser: 1.3 mg/dL — ABNORMAL HIGH (ref 0.61–1.24)
Glucose, Bld: 108 mg/dL — ABNORMAL HIGH (ref 70–99)
HCT: 36 % — ABNORMAL LOW (ref 39.0–52.0)
Hemoglobin: 12.2 g/dL — ABNORMAL LOW (ref 13.0–17.0)
Potassium: 3.8 mmol/L (ref 3.5–5.1)
Sodium: 141 mmol/L (ref 135–145)
TCO2: 23 mmol/L (ref 22–32)

## 2024-01-15 LAB — POCT ACTIVATED CLOTTING TIME: Activated Clotting Time: 227 s

## 2024-01-15 SURGERY — ABDOMINAL AORTOGRAM
Anesthesia: LOCAL

## 2024-01-15 MED ORDER — SODIUM CHLORIDE 0.9 % WEIGHT BASED INFUSION: 1.0000 mL/kg/h | INTRAVENOUS | Status: DC

## 2024-01-15 MED ORDER — MIDAZOLAM HCL 2 MG/2ML IJ SOLN
INTRAMUSCULAR | Status: AC
  Filled 2024-01-15: qty 2

## 2024-01-15 MED ORDER — MORPHINE SULFATE (PF) 2 MG/ML IV SOLN: 2.0000 mg | INTRAVENOUS | Status: DC | PRN

## 2024-01-15 MED ORDER — OXYCODONE HCL 5 MG PO TABS: 5.0000 mg | ORAL_TABLET | ORAL | Status: DC | PRN

## 2024-01-15 MED ORDER — FENTANYL CITRATE (PF) 100 MCG/2ML IJ SOLN
INTRAMUSCULAR | Status: DC | PRN
  Administered 2024-01-15: 50 ug via INTRAVENOUS
  Administered 2024-01-15 (×2): 25 ug via INTRAVENOUS

## 2024-01-15 MED ORDER — HEPARIN SODIUM (PORCINE) 1000 UNIT/ML IJ SOLN
INTRAMUSCULAR | Status: AC
  Filled 2024-01-15: qty 10

## 2024-01-15 MED ORDER — IODIXANOL 320 MG/ML IV SOLN
INTRAVENOUS | Status: DC | PRN
Start: 2024-01-15 — End: 2024-01-15
  Administered 2024-01-15: 100 mL via INTRA_ARTERIAL

## 2024-01-15 MED ORDER — SODIUM CHLORIDE 0.9 % IV SOLN: INTRAVENOUS | Status: DC

## 2024-01-15 MED ORDER — LIDOCAINE HCL (PF) 1 % IJ SOLN
INTRAMUSCULAR | Status: AC
  Filled 2024-01-15: qty 30

## 2024-01-15 MED ORDER — SODIUM CHLORIDE 0.9% FLUSH: 3.0000 mL | Freq: Two times a day (BID) | INTRAVENOUS | Status: DC

## 2024-01-15 MED ORDER — HYDRALAZINE HCL 20 MG/ML IJ SOLN: 5.0000 mg | INTRAMUSCULAR | Status: DC | PRN

## 2024-01-15 MED ORDER — LABETALOL HCL 5 MG/ML IV SOLN: 10.0000 mg | INTRAVENOUS | Status: DC | PRN

## 2024-01-15 MED ORDER — FENTANYL CITRATE (PF) 100 MCG/2ML IJ SOLN
INTRAMUSCULAR | Status: AC
  Filled 2024-01-15: qty 2

## 2024-01-15 MED ORDER — HEPARIN SODIUM (PORCINE) 1000 UNIT/ML IJ SOLN
INTRAMUSCULAR | Status: DC | PRN
  Administered 2024-01-15: 7000 [IU] via INTRAVENOUS

## 2024-01-15 MED ORDER — SODIUM CHLORIDE 0.9% FLUSH: 3.0000 mL | INTRAVENOUS | Status: DC | PRN

## 2024-01-15 MED ORDER — LIDOCAINE HCL (PF) 1 % IJ SOLN
INTRAMUSCULAR | Status: DC | PRN
  Administered 2024-01-15: 10 mL
  Administered 2024-01-15: 20 mL

## 2024-01-15 MED ORDER — ONDANSETRON HCL 4 MG/2ML IJ SOLN: 4.0000 mg | Freq: Four times a day (QID) | INTRAMUSCULAR | Status: DC | PRN

## 2024-01-15 MED ORDER — SODIUM CHLORIDE 0.9 % IV SOLN: 250.0000 mL | INTRAVENOUS | Status: DC | PRN

## 2024-01-15 MED ORDER — MIDAZOLAM HCL 2 MG/2ML IJ SOLN
INTRAMUSCULAR | Status: DC | PRN
  Administered 2024-01-15 (×2): 1 mg via INTRAVENOUS
  Administered 2024-01-15: 2 mg via INTRAVENOUS

## 2024-01-15 MED ORDER — HEPARIN (PORCINE) IN NACL 1000-0.9 UT/500ML-% IV SOLN
INTRAVENOUS | Status: DC | PRN
  Administered 2024-01-15 (×2): 500 mL

## 2024-01-15 MED ORDER — ACETAMINOPHEN 325 MG PO TABS: 650.0000 mg | ORAL_TABLET | ORAL | Status: DC | PRN

## 2024-01-15 SURGICAL SUPPLY — 21 items
BALLOON MUSTANG 7.0X40 135 (BALLOONS) IMPLANT
CATH OMNI FLUSH 5F 65CM (CATHETERS) IMPLANT
CATH SOFT-VU 4F 65 STRAIGHT (CATHETERS) IMPLANT
DCB IN.PACT 4X120 (BALLOONS) IMPLANT
DEVICE VASC CLSR CELT ART 5 (Vascular Products) IMPLANT
DEVICE VASC CLSR CELT ART 6 (Vascular Products) IMPLANT
GLIDEWIRE ADV .035X260CM (WIRE) IMPLANT
KIT ENCORE 26 ADVANTAGE (KITS) IMPLANT
KIT MICROPUNCTURE NIT STIFF (SHEATH) IMPLANT
KIT PV (KITS) ×1 IMPLANT
SET ATX-X65L (MISCELLANEOUS) IMPLANT
SHEATH CATAPULT 5F 45 MP (SHEATH) IMPLANT
SHEATH CATAPULT 6FR 45 (SHEATH) IMPLANT
SHEATH PINNACLE 5F 10CM (SHEATH) IMPLANT
SHEATH PINNACLE 6F 10CM (SHEATH) IMPLANT
SHEATH PROBE COVER 6X72 (BAG) IMPLANT
STENT ZILVER PTX 8X60 (Permanent Stent) IMPLANT
SYR MEDRAD MARK 7 150ML (SYRINGE) ×1 IMPLANT
TRANSDUCER W/STOPCOCK (MISCELLANEOUS) ×1 IMPLANT
TRAY PV CATH (CUSTOM PROCEDURE TRAY) ×1 IMPLANT
WIRE BENTSON .035X145CM (WIRE) IMPLANT

## 2024-01-15 NOTE — Interval H&P Note (Signed)
 History and Physical Interval Note:  01/15/2024 9:51 AM  Ryan Pittman  has presented today for surgery, with the diagnosis of bypass graft stenosis.  The various methods of treatment have been discussed with the patient and family. After consideration of risks, benefits and other options for treatment, the patient has consented to  Procedure(s): ABDOMINAL AORTOGRAM (N/A) Lower Extremity Angiography (N/A) LOWER EXTREMITY INTERVENTION (N/A) as a surgical intervention.  The patient's history has been reviewed, patient examined, no change in status, stable for surgery.  I have reviewed the patient's chart and labs.  Questions were answered to the patient's satisfaction.     [name]

## 2024-01-15 NOTE — Op Note (Signed)
 Patient name: Ryan Pittman MRN: [id number] DOB: Dec 07, 1940 Sex: male  01/15/2024 Pre-operative Diagnosis: Left femoropopliteal bypass graft stenosis Post-operative diagnosis:  Same Surgeon:  [name] Procedure Performed:  1.  Ultrasound-guided access, left common femoral artery  2.  Ultrasound-guided access, right common femoral artery  3.  Abdominal aortogram  4.  Bilateral lower extremity angiogram  5.  Selective injection with catheter in left femoral-popliteal bypass graft from right groin access  6.  Drug-coated balloon angioplasty, left femoral-popliteal bypass graft and left popliteal artery  7.  Stent, left external iliac artery (Zilver PTX 8 x 60)  8.  Angioplasty right common iliac artery  9.  Angioplasty, right external iliac artery  10.   conscious sedation, 77 minutes  11.  Closure device, Celt   Indications: This is an 83 year old gentleman with multiple previous interventions who was found to have a threatened left femoral-popliteal bypass graft and is here today for further evaluation.  He also has bilateral iliac stenosis.  Procedure:  The patient was identified in the holding area and taken to room 8.  The patient was then placed supine on the table and prepped and draped in the usual sterile fashion.  A time out was called.  Conscious sedation was administered with the use of IV fentanyl  and Versed  under continuous physician and nurse monitoring.  Heart rate, blood pressure, and oxygen  saturation were continuously monitored.  Total sedation time was 77 minutes.  Ultrasound was used to evaluate the left common femoral artery.  It was patent .  A digital ultrasound image was acquired.  A micropuncture needle was used to access the left common femoral artery under ultrasound guidance.  An 018 wire was advanced without resistance and a micropuncture sheath was placed.  The 018 wire was removed and a benson wire was placed.  The micropuncture sheath was exchanged for a  5 french sheath.  An omniflush catheter was advanced over the wire to the level of L-1.  An abdominal angiogram was obtained.  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  The infrarenal abdominal aorta is widely patent without significant stenosis.  The left common iliac artery has some calcium within it but appears to be widely patent.  There is a stenosis in the left external iliac artery that was hemodynamically significant with a greater than 20 point pressure gradient.  In-stent stenosis greater than 50% is noted in the right common and external iliac stent  Right Lower Extremity: The right common femoral and profundofemoral artery are widely patent.  Stents within the right superficial femoral and popliteal artery are patent without significant stenosis.  Popliteal artery is widely patent.  There is two-vessel runoff via the peroneal and posterior tibial artery which have moderate disease  Left Lower Extremity: The left common femoral and profundofemoral artery are widely patent.  There is a femoral-popliteal bypass graft that is patent with a valve distally and the distal anastomosis that appeared to be the reason for low velocities.  The popliteal artery is widely patent with disease two-vessel runoff via the peroneal and posterior tibial artery.   Intervention: At this point the decision was made to proceed with intervention.  I felt right groin access was appropriate and so I remove the sheath in the left groin and closed it with a Celt.  The right femoral artery was cannulated under ultrasound guidance with a micropuncture needle.  I inserted a Bentson wire across the bifurcation and placed a 5 French sheath in the left femoral-popliteal bypass graft.  The patient was fully heparinized.  Additional images were acquired with the catheter in the bypass graft.  Better visualized the stenosis.  A  Glidewire advantage was advanced across the distal anastomosis and a Impact 4 x 120 balloon was used to perform balloon drug-coated angioplasty across the lesion.  Follow-up imaging showed excellent result.  I then turned my attention towards the external iliac artery.  There was a 20-30 mm pressure gradient in the external iliac artery.  I felt this needed to be primarily stented as it was threatening the patency of the bypass graft.  I had to exchanged out for 6x45 sheath and then a 8 x 60 Zilver PTX stent was deployed across the lesion and postdilated with a 7 mm balloon.  Subsequent imaging showed a widely patent extrailiac artery.  Next, attention was turned towards the in-stent stenosis on the right.  I selected a 7 mm balloon and performed balloon angioplasty of the in-stent stenosis in the common iliac and right external iliac artery with no residual stenosis.  Satisfied with these results, I closed the right groin with a Celt.  There were no complications  Impression:  #1 distal left femoral-popliteal bypass graft stenosis treated using a 4 mm drug-coated balloon with no residual stenosis  #2 two-vessel runoff bilaterally via diseased peroneal and posterior tibial artery  #3 stents within the right superficial femoral-popliteal artery widely patent  #4 left extrailiac stenosis associated with a 20-30 mm pressure gradient treated with a Zilver PTX 8 x 60  #5 in-stent stenosis on the right common and external iliac arteries treated with a 7 mm balloon   V. [name], M.D., [facility] [place] Office: [phone] Pager:  [phone]

## 2024-01-15 NOTE — Discharge Instructions (Signed)
 Femoral Site Care This sheet gives you information about how to care for yourself after your procedure. Your health care provider may also give you more specific instructions. If you have problems or questions, contact your health care provider. What can I expect after the procedure?  After the procedure, it is common to have: Bruising that usually fades within 1-2 weeks. Tenderness at the site. Follow these instructions at home: Wound care Follow instructions from your health care provider about how to take care of your insertion site. Make sure you: Wash your hands with soap and water before you change your bandage (dressing). If soap and water are not available, use hand sanitizer. Remove your dressing as told by your health care provider. 24 hours Do not take baths, swim, or use a hot tub until your health care provider approves. You may shower 24-48 hours after the procedure or as told by your health care provider. Gently wash the site with plain soap and water. Pat the area dry with a clean towel. Do not rub the site. This may cause bleeding. Do not apply powder or lotion to the site. Keep the site clean and dry. Check your femoral site every day for signs of infection. Check for: Redness, swelling, or pain. Fluid or blood. Warmth. Pus or a bad smell. Activity For the first 2-3 days after your procedure, or as long as directed: Avoid climbing stairs as much as possible. Do not squat. Do not lift anything that is heavier than 10 lb (4.5 kg), or the limit that you are told, until your health care provider says that it is safe. For 5 days Rest as directed. Avoid sitting for a long time without moving. Get up to take short walks every 1-2 hours. Do not drive for 24 hours if you were given a medicine to help you relax (sedative). General instructions Take over-the-counter and prescription medicines only as told by your health care provider. Keep all follow-up visits as told by your  health care provider. This is important. Contact a health care provider if you have: A fever or chills. You have redness, swelling, or pain around your insertion site. Get help right away if: The catheter insertion area swells very fast. You Lwin out. You suddenly start to sweat or your skin gets clammy. The catheter insertion area is bleeding, and the bleeding does not stop when you hold steady pressure on the area. The area near or just beyond the catheter insertion site becomes pale, cool, tingly, or numb. These symptoms may represent a serious problem that is an emergency. Do not wait to see if the symptoms will go away. Get medical help right away. Call your local emergency services (911 in the [place] Do not drive yourself to the hospital. Summary After the procedure, it is common to have bruising that usually fades within 1-2 weeks. Check your femoral site every day for signs of infection. Do not lift anything that is heavier than 10 lb (4.5 kg), or the limit that you are told, until your health care provider says that it is safe. This information is not intended to replace advice given to you by your health care provider. Make sure you discuss any questions you have with your health care provider. Document Revised: 09/03/2017 Document Reviewed: 09/03/2017 Elsevier Patient Education  2020 [employer].

## 2024-01-16 ENCOUNTER — Encounter: Payer: Self-pay | Admitting: Urology

## 2024-01-16 ENCOUNTER — Ambulatory Visit: Admitting: Urology

## 2024-01-16 VITALS — BP 147/73 | HR 56 | Ht 60.0 in | Wt 152.0 lb

## 2024-01-16 DIAGNOSIS — R3914 Feeling of incomplete bladder emptying: Secondary | ICD-10-CM

## 2024-01-16 DIAGNOSIS — N401 Enlarged prostate with lower urinary tract symptoms: Secondary | ICD-10-CM | POA: Diagnosis not present

## 2024-01-16 LAB — BLADDER SCAN AMB NON-IMAGING: Scan Result: 0

## 2024-01-16 MED ORDER — TAMSULOSIN HCL 0.4 MG PO CAPS: 0.4000 mg | ORAL_CAPSULE | Freq: Every day | ORAL | 3 refills | Status: AC

## 2024-01-16 NOTE — Progress Notes (Signed)
 I, Maysun Jamey Mccallum, acting as a Neurosurgeon for Geraline Knapp, MD., have documented all relevant documentation on the behalf of Geraline Knapp, MD, as directed by Geraline Knapp, MD while in the presence of Geraline Knapp, MD.  Discussed the use of AI scribe software for clinical note transcription with the patient, who gave verbal consent to proceed.   01/16/2024 4:06 PM   Ryan Pittman 06-05-1941 [id number]  Referring provider: Donnie Galea, MD [address] [place],  [place] [address]  Chief Complaint  Patient presents with   Benign Prostatic Hypertrophy   Urologic history: 1.  BPH with LUTS -Bothersome lower urinary tract symptoms 09/2018; PVR 119 mL -Started tamsulosin  with improvement  HPI: Ryan Pittman is a 83 y.o. male presents for annual follow-up.  He ran out of tamsulosin  approximately 1 month ago but has not seen any worsening in his lower urinary tract symptoms.  Denies dysuria, gross hematuria Denies flank, abdominal or pelvic pain   PMH: Past Medical History:  Diagnosis Date   Anemia    due to GIB, s/p transfusion   Anginal pain (HCC) 07/10/2018   Arthritis    back pain, much worse after consecutive golf rounds   Cancer [facility])    thyroid    Chronic kidney disease    Colon polyps    Coronary artery disease    COVID-19    Dyspnea    walking up a hill   Dysrhythmia 2020   GERD (gastroesophageal reflux disease)    Hypercalcemia    h/o, resolved as of 2012, prev due to high amount of calcium intake   Hyperlipidemia    Hypertension    Hypothyroidism    IgG gammopathy    stable as of 2012 per [facility]   Pneumonia    as a child   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 Arlester Ladd   PVD (peripheral vascular disease) (HCC)    L leg bypass, R leg stented    Surgical History: Past Surgical History:  Procedure Laterality Date    dental implant left bottom-did have bleeding       ABDOMINAL AORTOGRAM N/A 01/15/2024   Procedure: ABDOMINAL AORTOGRAM;  Surgeon: Margherita Shell, MD;  [facility]: [facility];  Service: Cardiovascular;  Laterality: N/A;   ABDOMINAL AORTOGRAM W/LOWER EXTREMITY Bilateral 06/26/2019   Procedure: ABDOMINAL AORTOGRAM W/LOWER EXTREMITY;  Surgeon: Avanell Leigh, MD;  [facility]: [facility];  Service: Cardiovascular;  Laterality: Bilateral;   ABDOMINAL AORTOGRAM W/LOWER EXTREMITY N/A 06/08/2020   Procedure: ABDOMINAL AORTOGRAM W/LOWER EXTREMITY;  Surgeon: Margherita Shell, MD;  [facility]: [facility];  Service: Cardiovascular;  Laterality: N/A;   ABDOMINAL AORTOGRAM W/LOWER EXTREMITY N/A 11/28/2022   Procedure: ABDOMINAL AORTOGRAM W/LOWER EXTREMITY;  Surgeon: Margherita Shell, MD;  [facility];  Service: Cardiovascular;  Laterality: N/A;   BYPASS GRAFT     L leg   CARDIAC CATHETERIZATION  06/24/2018   COLONOSCOPY N/A 10/09/2021   Procedure: COLONOSCOPY;  Surgeon: Toledo, Alphonsus Jeans, MD;  Location: [facility] ENDOSCOPY;  Service: Gastroenterology;  Laterality: N/A;   COLONOSCOPY WITH PROPOFOL  N/A 05/04/2015   Procedure: COLONOSCOPY WITH PROPOFOL ;  Surgeon: Deveron Fly, MD;  Location: [facility] ENDOSCOPY;  Service: Endoscopy;  Laterality: N/A;   CORONARY ARTERY BYPASS GRAFT N/A 07/11/2018   Procedure: CORONARY ARTERY BYPASS GRAFTING (CABG) x three, using left internal mammary artery and right leg  greater saphenous vein harvested endoscopically;  Surgeon: Bartley Lightning, MD;  Location: [facility] OR;  Service: Open Heart Surgery;  Laterality: N/A;   ENDARTERECTOMY FEMORAL Right 02/13/2020   Procedure: RIGHT ENDARTERECTOMY FEMORAL WITH BOVINE PATCH;  Surgeon: Margherita Shell, MD;  [facility]: [facility];  Service: Vascular;  Laterality: Right;   ESOPHAGOGASTRODUODENOSCOPY N/A 10/09/2021   Procedure: ESOPHAGOGASTRODUODENOSCOPY (EGD);  Surgeon: Toledo, Alphonsus Jeans, MD;  Location: [facility] ENDOSCOPY;  Service: Gastroenterology;  Laterality: N/A;   GIVENS  CAPSULE STUDY N/A 10/09/2021   Procedure: GIVENS CAPSULE STUDY;  Surgeon: Toledo, Alphonsus Jeans, MD;  Location: [facility] ENDOSCOPY;  Service: Gastroenterology;  Laterality: N/A;   LEFT ATRIAL APPENDAGE OCCLUSION N/A 06/29/2022   Procedure: LEFT ATRIAL APPENDAGE OCCLUSION;  Surgeon: Arnoldo Lapping, MD;  Location: [facility] INVASIVE CV LAB;  Service: Cardiovascular;  Laterality: N/A;   LEFT HEART CATH AND CORONARY ANGIOGRAPHY N/A 06/24/2018   Procedure: LEFT HEART CATH AND CORONARY ANGIOGRAPHY;  Surgeon: Avanell Leigh, MD;  [facility];  Service: Cardiovascular;  Laterality: N/A;   LOWER EXTREMITY ANGIOGRAPHY Bilateral 10/25/2021   Procedure: Lower Extremity Angiography;  Surgeon: Margherita Shell, MD;  [facility];  Service: Cardiovascular;  Laterality: Bilateral;   LOWER EXTREMITY ANGIOGRAPHY N/A 01/15/2024   Procedure: Lower Extremity Angiography;  Surgeon: Margherita Shell, MD;  [facility];  Service: Cardiovascular;  Laterality: N/A;   LOWER EXTREMITY INTERVENTION N/A 01/15/2024   Procedure: LOWER EXTREMITY INTERVENTION;  Surgeon: Margherita Shell, MD;  [facility];  Service: Cardiovascular;  Laterality: N/A;   PERIPHERAL VASCULAR ATHERECTOMY Right 06/08/2020   Procedure: PERIPHERAL VASCULAR ATHERECTOMY;  Surgeon: Margherita Shell, MD;  [facility];  Service: Cardiovascular;  Laterality: Right;  superficial femoral   PERIPHERAL VASCULAR BALLOON ANGIOPLASTY Right 06/08/2020   Procedure: PERIPHERAL VASCULAR BALLOON ANGIOPLASTY;  Surgeon: Margherita Shell, MD;  [facility];  Service: Cardiovascular;  Laterality: Right;  External iliac   PERIPHERAL VASCULAR BALLOON ANGIOPLASTY Right 11/28/2022   Procedure: PERIPHERAL VASCULAR BALLOON ANGIOPLASTY;  Surgeon: Margherita Shell, MD;  [facility];  Service: Cardiovascular;  Laterality: Right;  common illiac   PERIPHERAL VASCULAR INTERVENTION Right  10/25/2021   Procedure: PERIPHERAL VASCULAR INTERVENTION;  Surgeon: Margherita Shell, MD;  [facility];  Service: Cardiovascular;  Laterality: Right;  SFA   PERIPHERAL VASCULAR INTERVENTION Right 11/28/2022   Procedure: PERIPHERAL VASCULAR INTERVENTION;  Surgeon: Margherita Shell, MD;  [facility];  Service: Cardiovascular;  Laterality: Right;   RENAL ANGIOGRAPHY Bilateral 10/25/2021   Procedure: RENAL ANGIOGRAPHY;  Surgeon: Margherita Shell, MD;  [facility];  Service: Cardiovascular;  Laterality: Bilateral;   TEE WITHOUT CARDIOVERSION N/A 07/11/2018   Procedure: TRANSESOPHAGEAL ECHOCARDIOGRAM (TEE);  Surgeon: Bartley Lightning, MD;  Location[facility] OR;  Service: Open Heart Surgery;  Laterality: N/A;   TEE WITHOUT CARDIOVERSION N/A 06/29/2022   Procedure: TRANSESOPHAGEAL ECHOCARDIOGRAM (TEE);  Surgeon: Arnoldo Lapping, MD;  Location: [facility] INVASIVE CV LAB;  Service: Cardiovascular;  Laterality: N/A;   TEE WITHOUT CARDIOVERSION N/A 08/11/2022   Procedure: TRANSESOPHAGEAL ECHOCARDIOGRAM (TEE);  Surgeon: Luana Rumple, MD;  Location: [facility] ENDOSCOPY;  Service: Cardiovascular;  Laterality: N/A;   THYROIDECTOMY, PARTIAL  2016   VASCULAR SURGERY      Home Medications:  Allergies as of 01/16/2024       Reactions   Penicillins Itching, Other (See Comments)   Ace  Inhibitors Cough   Aspirin  Other (See Comments)   H/o GI bleed   Celebrex [celecoxib] Other (See Comments)   GI bleed   Lipitor [atorvastatin] Other (See Comments)   myalgias        Medication List        Accurate as of Jan 16, 2024  4:06 PM. If you have any questions, ask your nurse or doctor.          ALPRAZolam  0.5 MG tablet Commonly known as: XANAX  Take 0.25 mg by mouth at bedtime as needed for sleep or anxiety.   amLODipine  10 MG tablet Commonly known as: NORVASC  TAKE 1 TABLET BY MOUTH DAILY   benzonatate  200 MG capsule Commonly known as: TESSALON  Take 1 capsule (200 mg  total) by mouth 3 (three) times daily as needed.   chlorhexidine  0.12 % solution Commonly known as: PERIDEX  Use as directed 15 mLs in the mouth or throat daily.   clopidogrel  75 MG tablet Commonly known as: PLAVIX  Take 1 tablet (75 mg total) by mouth every Monday, Wednesday, and Friday.   hydrochlorothiazide 50 MG tablet Commonly known as: HYDRODIURIL Take 1 tablet by mouth daily.   Iron  (Ferrous Sulfate ) 325 (65 Fe) MG Tabs Take 325 mg by mouth daily.   levothyroxine  75 MCG tablet Commonly known as: SYNTHROID  TAKE 1 TABLET BY MOUTH 6 DAYS A WEEK AND1 & 1/2 TABLETS ONE DAY A WEEK AS DIRECTED.   metoprolol  tartrate 25 MG tablet Commonly known as: LOPRESSOR  TAKE 1 TABLET BY MOUTH TWICE DAILY   Multivitamin Men 50+ Tabs Take 1 tablet by mouth in the morning.   Repatha  SureClick 140 MG/ML Soaj Generic drug: Evolocumab  INJECT 140MG  EVERY 14 DAYS   tamsulosin  0.4 MG Caps capsule Commonly known as: FLOMAX  Take 1 capsule (0.4 mg total) by mouth daily.        Allergies:  Allergies  Allergen Reactions   Penicillins Itching and Other (See Comments)   Ace Inhibitors Cough   Aspirin  Other (See Comments)    H/o GI bleed   Celebrex [Celecoxib] Other (See Comments)    GI bleed   Lipitor [Atorvastatin] Other (See Comments)    myalgias    Family History: Family History  Problem Relation Age of Onset   Diabetes Mother    Stroke Father    Colon cancer Neg Hx    Prostate cancer Neg Hx     Social History:  reports that he quit smoking about 15 years ago. His smoking use included cigarettes. He has never been exposed to tobacco smoke. He has quit using smokeless tobacco.  His smokeless tobacco use included chew. He reports current alcohol use. He reports that he does not use drugs.   Physical Exam: BP (!) 147/73   Pulse (!) 56   Ht 5' (1.524 m)   Wt 152 lb (68.9 kg)   BMI 29.69 kg/m   Constitutional:  Alert and oriented, No acute distress. HEENT: [place] AT Respiratory:  Normal respiratory effort, no increased work of breathing. Psychiatric: Normal mood and affect.   Assessment & Plan:    1. Benign prostatic hyperplasia with lower urinary tract symptoms PVR today is 0 mL, indicating complete bladder emptying. The patient has not experienced any significant worsening of symptoms off Tamsulosin . Discussed the option of discontinuing Tamsulosin  due to the absence of bothersome symptoms. The patient requested a refill of Tamsulosin  in case symptoms worsen; prescription sent. Plan for a one-year follow-up with PVR  I have reviewed the above documentation  for accuracy and completeness, and I agree with the above.   Geraline Knapp, MD  [facility] [address], [address] [place], [place] [address] [phone]

## 2024-01-22 ENCOUNTER — Other Ambulatory Visit (INDEPENDENT_AMBULATORY_CARE_PROVIDER_SITE_OTHER)

## 2024-01-22 ENCOUNTER — Encounter: Payer: Self-pay | Admitting: Family Medicine

## 2024-01-22 ENCOUNTER — Ambulatory Visit (INDEPENDENT_AMBULATORY_CARE_PROVIDER_SITE_OTHER): Admitting: Family Medicine

## 2024-01-22 VITALS — BP 126/62 | HR 70 | Temp 97.7°F | Ht 60.0 in | Wt 152.8 lb

## 2024-01-22 DIAGNOSIS — D649 Anemia, unspecified: Secondary | ICD-10-CM | POA: Diagnosis not present

## 2024-01-22 DIAGNOSIS — D500 Iron deficiency anemia secondary to blood loss (chronic): Secondary | ICD-10-CM | POA: Diagnosis not present

## 2024-01-22 DIAGNOSIS — R059 Cough, unspecified: Secondary | ICD-10-CM

## 2024-01-22 LAB — CBC WITH DIFFERENTIAL/PLATELET
Basophils Absolute: 0.1 10*3/uL (ref 0.0–0.1)
Basophils Relative: 1.2 % (ref 0.0–3.0)
Eosinophils Absolute: 0.3 10*3/uL (ref 0.0–0.7)
Eosinophils Relative: 2.6 % (ref 0.0–5.0)
HCT: 37.5 % — ABNORMAL LOW (ref 39.0–52.0)
Hemoglobin: 12.9 g/dL — ABNORMAL LOW (ref 13.0–17.0)
Lymphocytes Relative: 14.1 % (ref 12.0–46.0)
Lymphs Abs: 1.6 10*3/uL (ref 0.7–4.0)
MCHC: 34.3 g/dL (ref 30.0–36.0)
MCV: 86.2 fl (ref 78.0–100.0)
Monocytes Absolute: 0.9 10*3/uL (ref 0.1–1.0)
Monocytes Relative: 8.1 % (ref 3.0–12.0)
Neutro Abs: 8.1 10*3/uL — ABNORMAL HIGH (ref 1.4–7.7)
Neutrophils Relative %: 74 % (ref 43.0–77.0)
Platelets: 233 10*3/uL (ref 150.0–400.0)
RBC: 4.35 Mil/uL (ref 4.22–5.81)
RDW: 19.1 % — ABNORMAL HIGH (ref 11.5–15.5)
WBC: 11 10*3/uL — ABNORMAL HIGH (ref 4.0–10.5)

## 2024-01-22 NOTE — Progress Notes (Signed)
 Discussed with patient about recent vascular evaluation and treatment.  Impression:             #1 distal left femoral-popliteal bypass graft stenosis treated using a 4 mm drug-coated balloon with no residual stenosis             #2 two-vessel runoff bilaterally via diseased peroneal and posterior tibial artery             #3 stents within the right superficial femoral-popliteal artery widely patent             #4 left extrailiac stenosis associated with a 20-30 mm pressure gradient treated with a Zilver PTX 8 x 60             #5 in-stent stenosis on the right common and external iliac arteries treated with a 7 mm balloon ========================    CBC pending.  D/w pt about taking plavix  3 per week vs 4 per week.  Off iron  in the meantime.  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  Discussed with patient about previous GI evaluation.  His legs feel better in the meantime.  No black or bloody stools.    His cough is better, that had been going on for weeks but gradually getting better.    Meds, vitals, and allergies reviewed.   ROS: Per HPI unless specifically indicated in ROS section   Nad [place] Neck supple, no LA Rrr SEM noted.  Ctab  Abdomen soft.  Nontender. Skin well-perfused.

## 2024-01-22 NOTE — Patient Instructions (Signed)
 The cough should get better.  Your lungs are clear.  Update me as needed.   If you have black or bloody stools, then let me know.   Keep taking plavix  3 times a week for now.  If tolerated well, you may be able to increase to 4 per week.   Take care.  Glad to see you.

## 2024-01-23 ENCOUNTER — Other Ambulatory Visit: Payer: Self-pay | Admitting: Family Medicine

## 2024-01-23 ENCOUNTER — Ambulatory Visit: Payer: Self-pay | Admitting: Family Medicine

## 2024-01-23 DIAGNOSIS — D500 Iron deficiency anemia secondary to blood loss (chronic): Secondary | ICD-10-CM

## 2024-01-23 NOTE — Assessment & Plan Note (Signed)
 History of.  The issue is if he can tolerate Plavix , and at what dose.  Discussed.  If black or bloody stools, then let me know.   Keep taking plavix  3 times a week for now.  If tolerated well, he may be able to increase to 4 per week.  See notes on labs.

## 2024-01-23 NOTE — Assessment & Plan Note (Signed)
 The cough should get better.  His lungs are clear.  Update me as needed.

## 2024-01-31 ENCOUNTER — Other Ambulatory Visit: Payer: Self-pay | Admitting: Family Medicine

## 2024-01-31 ENCOUNTER — Other Ambulatory Visit: Payer: Self-pay | Admitting: Cardiovascular Disease

## 2024-01-31 DIAGNOSIS — I359 Nonrheumatic aortic valve disorder, unspecified: Secondary | ICD-10-CM

## 2024-01-31 DIAGNOSIS — I10 Essential (primary) hypertension: Secondary | ICD-10-CM

## 2024-02-05 ENCOUNTER — Telehealth: Payer: Self-pay | Admitting: Cardiovascular Disease

## 2024-02-05 DIAGNOSIS — I10 Essential (primary) hypertension: Secondary | ICD-10-CM

## 2024-02-05 DIAGNOSIS — I359 Nonrheumatic aortic valve disorder, unspecified: Secondary | ICD-10-CM

## 2024-02-05 MED ORDER — METOPROLOL TARTRATE 25 MG PO TABS: 25.0000 mg | ORAL_TABLET | Freq: Two times a day (BID) | ORAL | 0 refills | Status: DC

## 2024-02-05 NOTE — Telephone Encounter (Signed)
*  STAT* If patient is at the pharmacy, call can be transferred to refill team.   1. Which medications need to be refilled? (please list name of each medication and dose if known) Metoprolol    2. Would you like to learn more about the convenience, safety, & potential cost savings by using the [facility]?    3. Are you open to using the [facility] (Type [facility].    4. Which pharmacy/location (including street and city if local pharmacy) is medication to be sent to?[facility]  [address], [place] ,[place]   5. Do they need a 30 day or 90 day supply? Enough until his appointment on 04-29-24

## 2024-02-05 NOTE — Telephone Encounter (Signed)
 Pt's medication was sent to pt's pharmacy as requested. Confirmation received.

## 2024-02-18 DIAGNOSIS — D2271 Melanocytic nevi of right lower limb, including hip: Secondary | ICD-10-CM | POA: Diagnosis not present

## 2024-02-18 DIAGNOSIS — L905 Scar conditions and fibrosis of skin: Secondary | ICD-10-CM | POA: Diagnosis not present

## 2024-02-20 ENCOUNTER — Other Ambulatory Visit: Payer: Self-pay

## 2024-02-20 DIAGNOSIS — I70213 Atherosclerosis of native arteries of extremities with intermittent claudication, bilateral legs: Secondary | ICD-10-CM

## 2024-02-21 ENCOUNTER — Ambulatory Visit: Admitting: Urology

## 2024-02-22 ENCOUNTER — Ambulatory Visit (INDEPENDENT_AMBULATORY_CARE_PROVIDER_SITE_OTHER): Admitting: Urology

## 2024-02-22 DIAGNOSIS — N401 Enlarged prostate with lower urinary tract symptoms: Secondary | ICD-10-CM

## 2024-02-22 LAB — BLADDER SCAN AMB NON-IMAGING

## 2024-02-22 NOTE — Progress Notes (Signed)
 Appointment today was scheduled for annual follow-up however he was seen 01/27/2024 for his annual follow-up.

## 2024-03-03 ENCOUNTER — Encounter: Payer: Self-pay | Admitting: Surgery

## 2024-03-03 ENCOUNTER — Ambulatory Visit (INDEPENDENT_AMBULATORY_CARE_PROVIDER_SITE_OTHER): Admitting: Surgery

## 2024-03-03 ENCOUNTER — Other Ambulatory Visit: Payer: Self-pay | Admitting: Family Medicine

## 2024-03-03 ENCOUNTER — Ambulatory Visit (HOSPITAL_COMMUNITY)
Admission: RE | Admit: 2024-03-03 | Discharge: 2024-03-03 | Disposition: A | Discharge status: Home / Self Care | Source: Ambulatory Visit | Attending: Surgery | Admitting: Surgery

## 2024-03-03 VITALS — BP 143/66 | HR 65 | Temp 97.9°F | Ht 60.0 in | Wt 150.0 lb

## 2024-03-03 DIAGNOSIS — I70213 Atherosclerosis of native arteries of extremities with intermittent claudication, bilateral legs: Secondary | ICD-10-CM | POA: Diagnosis not present

## 2024-03-03 LAB — VAS US ABI WITH/WO TBI

## 2024-03-03 NOTE — Progress Notes (Signed)
 Vascular and Vein Specialist of [facility]  Patient name: Ryan Pittman MRN: [id number] DOB: 1941-04-30 Sex: male   REASON FOR VISIT:    Follow-up  HISOTRY OF PRESENT ILLNESS:    Ryan Pittman is a 83 y.o. male here today for follow-up.  He has undergone the following procedures:   Left femoral to below-knee popliteal bypass graft by Dr. Marea 02/13/2020: Right iliofemoral endarterectomy with bovine patch angioplasty (claudication 06/08/2020: Drug-coated balloon angioplasty right common and external iliac artery.  Atherectomy with DCB, right superficial femoral artery (claudication) 10/25/2021: Right superficial femoral artery stent (claudication) 11/28/2022: Stent right popliteal artery, right external iliac artery, balloon angioplasty right common iliac artery (claudication) 01/15/2024: Drug-coated balloon angioplasty left femoral-popliteal bypass graft and left popliteal artery.  Left external iliac stent, angioplasty right common and external iliac artery   He is walking better since his most recent procedure.  He is getting 10-12,000 steps per day.  He is able to tolerate Plavix  3 times a week without issues with GI bleeding   Patient has a history of hypertension which is medically managed.  He is on a statin for hypercholesterolemia.  He is status post CABG for coronary artery disease in 2019.  Recently had a Watchman device placed.  He is off anticoagulation.  He does have a history of GI bleed    PAST MEDICAL HISTORY:   Past Medical History:  Diagnosis Date   Anemia    due to GIB, s/p transfusion   Anginal pain (HCC) 07/10/2018   Arthritis    back pain, much worse after consecutive golf rounds   Cancer [facility])    thyroid    Chronic kidney disease    Colon polyps    Coronary artery disease    COVID-19    Dyspnea    walking up a hill   Dysrhythmia 2020   GERD (gastroesophageal reflux disease)    Hypercalcemia    h/o, resolved as of  2012, prev due to high amount of calcium intake   Hyperlipidemia    Hypertension    Hypothyroidism    IgG gammopathy    stable as of 2012 per [facility]   Pneumonia    as a child   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 Wonda   PVD (peripheral vascular disease) (HCC)    L leg bypass, R leg stented     FAMILY HISTORY:   Family History  Problem Relation Age of Onset   Diabetes Mother    Stroke Father    Colon cancer Neg Hx    Prostate cancer Neg Hx     SOCIAL HISTORY:   Social History   Tobacco Use   Smoking status: Former    Current packs/day: 0.00    Types: Cigarettes    Quit date: 09/04/2008    Years since quitting: 15.5    Passive exposure: Never   Smokeless tobacco: Former    Types: Chew   Tobacco comments:    HAs quit tobacco products 2009  Substance Use Topics   Alcohol use: Yes    Comment: occ     ALLERGIES:   Allergies  Allergen Reactions   Penicillins Itching and Other (See Comments)   Ace Inhibitors Cough   Aspirin  Other (See Comments)    H/o GI bleed   Celebrex [Celecoxib] Other (See Comments)    GI bleed   Lipitor [Atorvastatin] Other (See Comments)    myalgias  CURRENT MEDICATIONS:   Current Outpatient Medications  Medication Sig Dispense Refill   ALPRAZolam  (XANAX ) 0.5 MG tablet Take 0.25 mg by mouth at bedtime as needed for sleep or anxiety.     amLODipine  (NORVASC ) 10 MG tablet TAKE 1 TABLET BY MOUTH DAILY 90 tablet 1   benzonatate  (TESSALON ) 200 MG capsule Take 1 capsule (200 mg total) by mouth 3 (three) times daily as needed. 30 capsule 1   clopidogrel  (PLAVIX ) 75 MG tablet Take 1 tablet (75 mg total) by mouth every Monday, Wednesday, and Friday.     hydrochlorothiazide (HYDRODIURIL) 50 MG tablet Take 1 tablet by mouth daily.     levothyroxine  (SYNTHROID ) 75 MCG tablet TAKE 1 TABLET BY MOUTH 6 DAYS A WEEK AND1 & 1/2 TABLETS ONE DAY A WEEK AS DIRECTED. 100 tablet 3    metoprolol  tartrate (LOPRESSOR ) 25 MG tablet Take 1 tablet (25 mg total) by mouth 2 (two) times daily. 90 tablet 0   Multiple Vitamins-Minerals (MULTIVITAMIN MEN 50+) TABS Take 1 tablet by mouth in the morning.     REPATHA  SURECLICK 140 MG/ML SOAJ INJECT 140MG  EVERY 14 DAYS 2 mL 11   tamsulosin  (FLOMAX ) 0.4 MG CAPS capsule Take 1 capsule (0.4 mg total) by mouth daily. 90 capsule 3   No current facility-administered medications for this visit.    REVIEW OF SYSTEMS:   [X]  denotes positive finding, [ ]  denotes negative finding Cardiac  Comments:  Chest pain or chest pressure:    Shortness of breath upon exertion:    Short of breath when lying flat:    Irregular heart rhythm:        Vascular    Pain in calf, thigh, or hip brought on by ambulation:    Pain in feet at night that wakes you up from your sleep:     Blood clot in your veins:    Leg swelling:         Pulmonary    Oxygen  at home:    Productive cough:     Wheezing:         Neurologic    Sudden weakness in arms or legs:     Sudden numbness in arms or legs:     Sudden onset of difficulty speaking or slurred speech:    Temporary loss of vision in one eye:     Problems with dizziness:         Gastrointestinal    Blood in stool:     Vomited blood:         Genitourinary    Burning when urinating:     Blood in urine:        Psychiatric    Major depression:         Hematologic    Bleeding problems:    Problems with blood clotting too easily:        Skin    Rashes or ulcers:        Constitutional    Fever or chills:      PHYSICAL EXAM:   Vitals:   03/03/24 0919  BP: (!) 143/66  Pulse: 65  Temp: 97.9 F (36.6 C)  SpO2: 97%  Weight: 150 lb (68 kg)  Height: 5' (1.524 m)    GENERAL: The patient is a well-nourished male, in no acute distress. The vital signs are documented above. CARDIAC: There is a regular rate and rhythm.  PULMONARY: Non-labored respirations MUSCULOSKELETAL: There are no major  deformities or cyanosis. NEUROLOGIC: No focal weakness  or paresthesias are detected. SKIN: There are no ulcers or rashes noted. PSYCHIATRIC: The patient has a normal affect.  STUDIES:   I have reviewed the following:  ABI/TBIToday's ABIToday's TBIPrevious ABIPrevious TBI  +-------+-----------+-----------+------------+------------+  Right [place]         0.37       [place]          0.41          +-------+-----------+-----------+------------+------------+  Left  [place]         0.42       [place]          0.32          +-------+-----------+-----------+------------+------------+   Right toe pressure: 56 Left toe pressure: 63  Right: Patent SFA stent without evidence of hemodynamically significant  stenosis.  Distal EIA/proximal CFA velocities of 195 cm/s are at the top of the  30-49% range.   Left: Patent femoropopliteal bypass graft without evidence of stenosis  within the graft.  Inflow velocities of the native CFA suggest 50-74% stenosis.   MEDICAL ISSUES:   PAD: The patient is ambulating better after his most recent intervention.  We discussed the ultrasound findings today.  He will need close surveillance.  He will follow-up in 6 months.  He knows to contact me sooner should he develop a change in symptoms    Malvina New, IV, MD, FACS Vascular and Vein Specialists of [facility] [phone] Pager [phone]

## 2024-03-05 ENCOUNTER — Other Ambulatory Visit: Payer: Self-pay | Admitting: *Deleted

## 2024-03-05 ENCOUNTER — Ambulatory Visit: Admitting: Urology

## 2024-03-05 DIAGNOSIS — I70213 Atherosclerosis of native arteries of extremities with intermittent claudication, bilateral legs: Secondary | ICD-10-CM

## 2024-03-05 DIAGNOSIS — I739 Peripheral vascular disease, unspecified: Secondary | ICD-10-CM

## 2024-03-31 DIAGNOSIS — R0609 Other forms of dyspnea: Secondary | ICD-10-CM | POA: Diagnosis not present

## 2024-03-31 DIAGNOSIS — J449 Chronic obstructive pulmonary disease, unspecified: Secondary | ICD-10-CM | POA: Diagnosis not present

## 2024-04-10 ENCOUNTER — Other Ambulatory Visit: Payer: Self-pay | Admitting: Cardiovascular Disease

## 2024-04-10 DIAGNOSIS — I10 Essential (primary) hypertension: Secondary | ICD-10-CM

## 2024-04-10 DIAGNOSIS — I359 Nonrheumatic aortic valve disorder, unspecified: Secondary | ICD-10-CM

## 2024-04-15 ENCOUNTER — Ambulatory Visit
Admission: EM | Admit: 2024-04-15 | Discharge: 2024-04-15 | Disposition: A | Discharge status: Home / Self Care | Attending: Emergency Medicine | Admitting: Emergency Medicine

## 2024-04-15 ENCOUNTER — Encounter: Payer: Self-pay | Admitting: Emergency Medicine

## 2024-04-15 DIAGNOSIS — S31813A Puncture wound without foreign body of right buttock, initial encounter: Secondary | ICD-10-CM

## 2024-04-15 DIAGNOSIS — W57XXXA Bitten or stung by nonvenomous insect and other nonvenomous arthropods, initial encounter: Secondary | ICD-10-CM

## 2024-04-15 DIAGNOSIS — S30860A Insect bite (nonvenomous) of lower back and pelvis, initial encounter: Secondary | ICD-10-CM | POA: Diagnosis not present

## 2024-04-15 MED ORDER — DOXYCYCLINE HYCLATE 100 MG PO CAPS: 100.0000 mg | ORAL_CAPSULE | Freq: Two times a day (BID) | ORAL | 0 refills | Status: DC

## 2024-04-15 MED ORDER — TRIAMCINOLONE ACETONIDE 0.1 % EX CREA: 1.0000 | TOPICAL_CREAM | Freq: Two times a day (BID) | CUTANEOUS | 0 refills | Status: DC

## 2024-04-15 NOTE — ED Provider Notes (Signed)
 [facility]    CSN: [id number] Arrival date & time: 04/15/24  0801      History   Chief Complaint Chief Complaint  Patient presents with   Insect Bite    HPI Ryan Pittman is a 83 y.o. male.   Patient presents for evaluation for a possible insect bite to the right buttocks beginning 2 weeks ago.  Wife removed a black dot from the affected area, unsure of type of insect.  Beginning 1 day ago noticed a bump and pruritus.  Endorses him feeling warm overnight but fever not present when measured.  Denies drainage, pain.  Has attempted Neosporin.  Past Medical History:  Diagnosis Date   Anemia    due to GIB, s/p transfusion   Anginal pain (HCC) 07/10/2018   Arthritis    back pain, much worse after consecutive golf rounds   Cancer [facility])    thyroid    Chronic kidney disease    Colon polyps    Coronary artery disease    COVID-19    Dyspnea    walking up a hill   Dysrhythmia 2020   GERD (gastroesophageal reflux disease)    Hypercalcemia    h/o, resolved as of 2012, prev due to high amount of calcium intake   Hyperlipidemia    Hypertension    Hypothyroidism    IgG gammopathy    stable as of 2012 per [facility]   Pneumonia    as a child   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   Acute  respiratory failure with hypoxia and hypercapnia (HCC)    Idiopathic hypercalcemia 08/03/2019   Normocytic anemia 07/09/2019   Ankle pain 07/02/2019   CAD (coronary artery disease) 08/08/2018   PAF (paroxysmal atrial fibrillation) (HCC) 08/06/2018   Hx of CABG 07/11/2018   Health care maintenance 04/04/2018   Atypical chest pain 04/04/2018   Hypothyroidism 03/30/2017   Advance care planning 03/30/2017   Cough 09/27/2016   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   Renal artery stenosis (HCC) 07/04/2016   BCC (basal cell carcinoma), 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   Hyperglycemia 12/28/2015   Hyperlipidemia 12/28/2015   Hypothyroidism, 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   Thyroid  cancer (HCC) 11/12/2014   Shortness of breath 04/22/2014   Spasm 04/22/2014   RLS (restless legs syndrome) 11/16/2013   Insomnia 06/18/2013   Medicare annual wellness visit, subsequent 03/12/2012   HTN (hypertension) 08/14/2011   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   CKD (chronic kidney disease) stage 3, GFR 30-59 ml/min (HCC) 02/13/2011   OA (osteoarthritis) 02/13/2011   Carotid stenosis 02/13/2011   ED (erectile dysfunction) 02/13/2011   Duodenal ulcer 02/13/2011   Diverticulosis 02/13/2011   PVD (peripheral vascular disease) (HCC) 01/26/2011   S/P bypass graft of extremity 01/26/2011   Essential hypertension, benign 01/26/2011   Back pain 01/26/2011  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   Nocturia 05/12/2008   Urinary hesitancy 05/12/2008    Past Surgical History:  Procedure Laterality Date    dental implant left bottom-did have bleeding      ABDOMINAL AORTOGRAM N/A 01/15/2024   Procedure: ABDOMINAL AORTOGRAM;  Surgeon: Serene Gaile ORN, MD;  Location: [facility];  Service: Cardiovascular;  Laterality: N/A;   ABDOMINAL AORTOGRAM W/LOWER EXTREMITY Bilateral 06/26/2019   Procedure: ABDOMINAL AORTOGRAM W/LOWER EXTREMITY;  Surgeon: Court Dorn PARAS, MD;  Location: [facility];  Service:  Cardiovascular;  Laterality: Bilateral;   ABDOMINAL AORTOGRAM W/LOWER EXTREMITY N/A 06/08/2020   Procedure: ABDOMINAL AORTOGRAM W/LOWER EXTREMITY;  Surgeon: Serene Gaile ORN, MD;  Location: [facility];  Service: Cardiovascular;  Laterality: N/A;   ABDOMINAL AORTOGRAM W/LOWER EXTREMITY N/A 11/28/2022   Procedure: ABDOMINAL AORTOGRAM W/LOWER EXTREMITY;  Surgeon: Serene Gaile ORN, MD;  Location: [facility];  Service: Cardiovascular;  Laterality: N/A;   BYPASS GRAFT     L leg   CARDIAC CATHETERIZATION  06/24/2018   COLONOSCOPY N/A 10/09/2021   Procedure: COLONOSCOPY;  Surgeon: Toledo, Ladell POUR, MD;  Location: [facility] ENDOSCOPY;  Service: Gastroenterology;  Laterality: N/A;   COLONOSCOPY WITH PROPOFOL  N/A 05/04/2015   Procedure: COLONOSCOPY WITH PROPOFOL ;  Surgeon: Gladis RAYMOND Mariner, MD;  Location: [facility] ENDOSCOPY;  Service: Endoscopy;  Laterality: N/A;   CORONARY ARTERY BYPASS GRAFT N/A 07/11/2018   Procedure: CORONARY ARTERY BYPASS GRAFTING (CABG) x three, using left internal mammary artery and right leg greater saphenous vein harvested endoscopically;  Surgeon: Lucas Dorise POUR, MD;  Location: [facility] OR;  Service: Open Heart Surgery;  Laterality: N/A;   ENDARTERECTOMY FEMORAL Right 02/13/2020   Procedure: RIGHT ENDARTERECTOMY FEMORAL WITH BOVINE PATCH;  Surgeon: Serene Gaile ORN, MD;  Location: [facility] OR;  Service: Vascular;  Laterality: Right;   ESOPHAGOGASTRODUODENOSCOPY N/A 10/09/2021   Procedure: ESOPHAGOGASTRODUODENOSCOPY (EGD);  Surgeon: Toledo, Ladell POUR, MD;  Location: [facility] ENDOSCOPY;  Service: Gastroenterology;  Laterality: N/A;   GIVENS CAPSULE STUDY N/A 10/09/2021   Procedure: GIVENS CAPSULE STUDY;  Surgeon: Toledo, Ladell POUR, MD;  Location: [facility] ENDOSCOPY;  Service: Gastroenterology;  Laterality: N/A;   LEFT ATRIAL APPENDAGE OCCLUSION N/A 06/29/2022   Procedure: LEFT ATRIAL APPENDAGE OCCLUSION;  Surgeon: Wonda Sharper, MD;  [facility] INVASIVE CV LAB;  Service: Cardiovascular;   Laterality: N/A;   LEFT HEART CATH AND CORONARY ANGIOGRAPHY N/A 06/24/2018   Procedure: LEFT HEART CATH AND CORONARY ANGIOGRAPHY;  Surgeon: Court Dorn PARAS, MD;  [facility];  Service: Cardiovascular;  Laterality: N/A;   LOWER EXTREMITY ANGIOGRAPHY Bilateral 10/25/2021   Procedure: Lower Extremity Angiography;  Surgeon: Serene Gaile ORN, MD;  [facility];  Service: Cardiovascular;  Laterality: Bilateral;   LOWER EXTREMITY ANGIOGRAPHY N/A 01/15/2024   Procedure: Lower Extremity Angiography;  Surgeon: Serene Gaile ORN, MD;  [facility];  Service: Cardiovascular;  Laterality: N/A;   LOWER EXTREMITY INTERVENTION N/A 01/15/2024   Procedure: LOWER EXTREMITY INTERVENTION;  Surgeon: Serene Gaile ORN, MD;  [facility];  Service: Cardiovascular;  Laterality: N/A;   PERIPHERAL VASCULAR ATHERECTOMY Right 06/08/2020   Procedure: PERIPHERAL VASCULAR ATHERECTOMY;  Surgeon: Serene Gaile ORN, MD;  [facility];  Service: Cardiovascular;  Laterality: Right;  superficial femoral   PERIPHERAL VASCULAR BALLOON ANGIOPLASTY Right 06/08/2020   Procedure: PERIPHERAL VASCULAR BALLOON ANGIOPLASTY;  Surgeon: Serene Gaile ORN, MD;  [facility];  Service: Cardiovascular;  Laterality: Right;  External  iliac   PERIPHERAL VASCULAR BALLOON ANGIOPLASTY Right 11/28/2022   Procedure: PERIPHERAL VASCULAR BALLOON ANGIOPLASTY;  Surgeon: Serene Gaile ORN, MD;  [facility];  Service: Cardiovascular;  Laterality: Right;  common illiac   PERIPHERAL VASCULAR INTERVENTION Right 10/25/2021   Procedure: PERIPHERAL VASCULAR INTERVENTION;  Surgeon: Serene Gaile ORN, MD;  [facility];  Service: Cardiovascular;  Laterality: Right;  SFA   PERIPHERAL VASCULAR INTERVENTION Right 11/28/2022   Procedure: PERIPHERAL VASCULAR INTERVENTION;  Surgeon: Serene Gaile ORN, MD;  [facility];  Service: Cardiovascular;   Laterality: Right;   RENAL ANGIOGRAPHY Bilateral 10/25/2021   Procedure: RENAL ANGIOGRAPHY;  Surgeon: Serene Gaile ORN, MD;  [facility];  Service: Cardiovascular;  Laterality: Bilateral;   TEE WITHOUT CARDIOVERSION N/A 07/11/2018   Procedure: TRANSESOPHAGEAL ECHOCARDIOGRAM (TEE);  Surgeon: Lucas Dorise POUR, MD;  Location: [facility] OR;  Service: Open Heart Surgery;  Laterality: N/A;   TEE WITHOUT CARDIOVERSION N/A 06/29/2022   Procedure: TRANSESOPHAGEAL ECHOCARDIOGRAM (TEE);  Surgeon: Wonda Sharper, MD;  Location: [facility] INVASIVE CV LAB;  Service: Cardiovascular;  Laterality: N/A;   TEE WITHOUT CARDIOVERSION N/A 08/11/2022   Procedure: TRANSESOPHAGEAL ECHOCARDIOGRAM (TEE);  Surgeon: Francyne Headland, MD;  Location: [facility] ENDOSCOPY;  Service: Cardiovascular;  Laterality: N/A;   THYROIDECTOMY, PARTIAL  2016   VASCULAR SURGERY         Home Medications    Prior to Admission medications   Medication Sig Start Date End Date Taking? Authorizing Provider  doxycycline  (VIBRAMYCIN ) 100 MG capsule Take 1 capsule (100 mg total) by mouth 2 (two) times daily. 04/15/24  Yes Kashius Dominic R, NP  triamcinolone  cream (KENALOG ) 0.1 % Apply 1 Application topically 2 (two) times daily. 04/15/24  Yes Samuel Rittenhouse R, NP  ALPRAZolam  (XANAX ) 0.5 MG tablet Take 0.25 mg by mouth at bedtime as needed for sleep or anxiety.    [provider]  amLODipine  (NORVASC ) 10 MG tablet TAKE 1 TABLET BY MOUTH DAILY 03/04/24   Cleatus Arlyss RAMAN, MD  benzonatate  (TESSALON ) 200 MG capsule Take 1 capsule (200 mg total) by mouth 3 (three) times daily as needed. 01/09/24   Cleatus Arlyss RAMAN, MD  clopidogrel  (PLAVIX ) 75 MG tablet Take 1 tablet (75 mg total) by mouth every Monday, Wednesday, and Friday. 01/09/24   Cleatus Arlyss RAMAN, MD  hydrochlorothiazide (HYDRODIURIL) 50 MG tablet Take 1 tablet by mouth daily. 12/22/22 03/03/24  [provider]  levothyroxine  (SYNTHROID ) 75 MCG tablet TAKE 1 TABLET BY MOUTH 6 DAYS A WEEK  AND1 & 1/2 TABLETS ONE DAY A WEEK AS DIRECTED. 01/31/24   Cleatus Arlyss RAMAN, MD  metoprolol  tartrate (LOPRESSOR ) 25 MG tablet TAKE ONE TABLET BY MOUTH TWICE DAILY 04/10/24   Gollan, Timothy J, MD  Multiple Vitamins-Minerals (MULTIVITAMIN MEN 50+) TABS Take 1 tablet by mouth in the morning.    [provider]  REPATHA  SURECLICK 140 MG/ML SOAJ INJECT 140MG  EVERY 14 DAYS 07/26/23   Court Dorn PARAS, MD  tamsulosin  (FLOMAX ) 0.4 MG CAPS capsule Take 1 capsule (0.4 mg total) by mouth daily. 01/16/24   Stoioff, Glendia BROCKS, MD    Family History Family History  Problem Relation Age of Onset   Diabetes Mother    Stroke Father    Colon cancer Neg Hx    Prostate cancer Neg Hx     Social History Social History   Tobacco Use   Smoking status: Former    Current packs/day: 0.00    Types: Cigarettes  Quit date: 09/04/2008    Years since quitting: 15.6    Passive exposure: Never   Smokeless tobacco: Former    Types: Chew   Tobacco comments:    HAs quit tobacco products 2009  Vaping Use   Vaping status: Never Used  Substance Use Topics   Alcohol use: Yes    Comment: occ   Drug use: No     Allergies   Penicillins, Ace inhibitors, Aspirin , Celebrex [celecoxib], and Lipitor [atorvastatin]   Review of Systems Review of Systems   Physical Exam Triage Vital Signs ED Triage Vitals  Encounter Vitals Group     BP 04/15/24 0816 127/74     Girls Systolic BP Percentile --      Girls Diastolic BP Percentile --      Boys Systolic BP Percentile --      Boys Diastolic BP Percentile --      Pulse Rate 04/15/24 0816 81     Resp 04/15/24 0816 18     Temp 04/15/24 0816 97.8 F (36.6 C)     Temp Source 04/15/24 0816 Oral     SpO2 04/15/24 0816 97 %     Weight --      Height --      Head Circumference --      Peak Flow --      Pain Score 04/15/24 0819 0     Pain Loc --      Pain Education --      Exclude from Growth Chart --    No data found.  Updated Vital Signs BP 127/74 (BP  Location: Right Arm)   Pulse 81   Temp 97.8 F (36.6 C) (Oral)   Resp 18   SpO2 97%   Visual Acuity Right Eye Distance:   Left Eye Distance:   Bilateral Distance:    Right Eye Near:   Left Eye Near:    Bilateral Near:     Physical Exam Constitutional:      Appearance: Normal appearance.  Eyes:     Extraocular Movements: Extraocular movements intact.  Pulmonary:     Effort: Pulmonary effort is normal.  Skin:    Comments: 0.5 cm puncture wound present to the right buttock, papule, surrounding erythema, skin cool to touch, no drainage noted  Neurological:     Mental Status: He is alert and oriented to person, place, and time.      UC Treatments / Results  Labs (all labs ordered are listed, but only abnormal results are displayed) Labs Reviewed - No data to display  EKG   Radiology No results found.  Procedures Procedures (including critical care time)  Medications Ordered in UC Medications - No data to display  Initial Impression / Assessment and Plan / UC Course  I have reviewed the triage vital signs and the nursing notes.  Pertinent labs & imaging results that were available during my care of the patient were reviewed by me and considered in my medical decision making (see chart for details).  Puncture wound of right buttock, to the buttock, initial encounter  Appears to be localized reaction, no signs of infection, discussed, wife brought insect with her in plastic bag, insect is a tick.  Presentation not consistent with bull's-eye, empirically placed on doxycycline  as an 2 weeks of bite as well as prescribed Kenalog  cream for swelling and itching, advised over-the-counter measures for management of pruritus and advised follow-up with primary doctor if delays in healing Final Clinical Impressions(s) / UC  Diagnoses   Final diagnoses:  Tick bite of buttock, initial encounter  Puncture wound of left buttock, initial encounter     Discharge Instructions       Your evaluated for tick bite at this time there is not appear to be infected  Apply triamcinolone  cream twice daily over the affected area to help reduce swelling and itching  Take doxycycline  twice daily for 7 days to empirically keep area for becoming infected  You may apply any topical medicine such as Benadryl  cream or calamine lotion to further assist with itching  You may apply ice over the affected area to soothe the skin  If you have concerns regarding healing please follow-up with your primary doctor for reevaluation   ED Prescriptions     Medication Sig Dispense Auth. Provider   doxycycline  (VIBRAMYCIN ) 100 MG capsule Take 1 capsule (100 mg total) by mouth 2 (two) times daily. 14 capsule Deziray Nabi R, NP   triamcinolone  cream (KENALOG ) 0.1 % Apply 1 Application topically 2 (two) times daily. 30 g Teresa Shelba SAUNDERS, NP      PDMP not reviewed this encounter.   Teresa Shelba SAUNDERS, [place] 04/15/24 [phone]

## 2024-04-15 NOTE — ED Triage Notes (Signed)
 Patient reports insect bite to right upper buttocks x 2 weeks ago. Unsure of what kind of insect. Denies pain. Patient has used neosporin on site.

## 2024-04-15 NOTE — Discharge Instructions (Signed)
 Your evaluated for tick bite at this time there is not appear to be infected  Apply triamcinolone  cream twice daily over the affected area to help reduce swelling and itching  Take doxycycline  twice daily for 7 days to empirically keep area for becoming infected  You may apply any topical medicine such as Benadryl  cream or calamine lotion to further assist with itching  You may apply ice over the affected area to soothe the skin  If you have concerns regarding healing please follow-up with your primary doctor for reevaluation

## 2024-04-28 NOTE — Progress Notes (Unsigned)
 Cardiology Office Note  Date:  04/29/2024   ID:  OGDEN HANDLIN, DOB 12-31-1940, MRN [id number]  PCP:  Cleatus Arlyss RAMAN, MD   Chief Complaint  Patient presents with   12 month follow up     Patient denies chest pain or shortness of breath.     HPI:  BARTLOMIEJ JENKINSON is a 83 y.o. 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  LOV with myself 8/24  In follow-up reports feeling well Continues to work at [employer], wife also works across the street  Denies claudication symptoms No chest pain or shortness of breath concerning for angina No further GI bleeding  Labs reviewed A1C 5.2 Total chol 139, LDL 34   Discussed recent imaging studies Lower extremity arterial duplex June 2025 Right: Patent SFA stent without evidence of hemodynamically significant  stenosis. Distal EIA/proximal CFA velocities of 195 cm/s are at the top of the 30-49% range.   Left: Patent femoropopliteal bypass graft without evidence of stenosis within the graft.  Inflow velocities of the native CFA suggest 50-74% stenosis.   EKG personally reviewed by myself on todays visit EKG Interpretation Date/Time:  Tuesday April 29 2024 09:14:32 EDT Ventricular Rate:  66 PR Interval:  170 QRS Duration:  88 QT Interval:  412 QTC Calculation: 431 R Axis:   50  Text Interpretation: Normal sinus rhythm Normal ECG When compared with ECG of 30-Apr-2023 10:29, No significant change was found Confirmed by Perla Lye [id number]) on 04/29/2024 9:23:31 AM   Other past medical history reviewed hospitalized in 10/2021 found to have a hemoglobin down to about 7 mg/dL. He received 2 units  PRBCs with Hb improvement. 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   started on ASA and Plavix  with plans to continue DAPT through 6 months of therapy (12/29/22) as tolerated. Unfortunately it appears he was having trouble with anemia felt to be secondary to ASA therefore he was continued on Plavix  monotherapy. TEE 08/11/22 showed well seated device with no leak or thrombus.   right popliteal, external and right common iliac stenting 11/28/22.   More GERD sx, stopped his PPI  The past medical history reviewed positive stress test and a subsequent coronary CTA on 05/24/2018 that revealed elevated calcium score of 2858 with three-vessel calcification.  He ultimately underwent cardiac catheterization shortly after that revealed three-vessel disease with preserved EF.  He underwent CABG x5 by Dr. Lucas.  He was placed on Repatha  for his lipid profile.    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  He ultimately underwent right femoral enterectomy and patch angioplasty by Dr. Serene.    diagnosed with atrial fibrillation with RVR when he presented to [facility] with chest pain.  He was placed on Eliquis  therapy.   2 weeks heart monitor obtained in January  2023 showed sinus rhythm with rare short episode of SVT, occasional PACs and PVCs , no atrial fibrillation.     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   It was felt nonbleeding vascular malformation could be a source of GI bleed.   right SFA stenting by Dr. Serene on 10/25/2021.   Due to GI bleeding, his Plavix  was discontinued and aspirin  added.    Myoview  on 11/18/2021 that showed EF 62%, no evidence of infarction or ischemia, overall low risk study.    Renal artery Doppler obtained in January 2023 demonstrated greater than 60% stenosis in the left renal artery.   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   underwent overlapping stents of right SFA by Dr. Serene on 10/25/2021.  No significant renal artery stenosis was identified at that time.    Due to progressive anemia, Eliquis  was temporarily taken off in March 2023.  He is currently being evaluated by hematology and oncology service, who felt iron  deficiency is likely related to GI bleed.  He previously has underwent multiple GI evaluation earlier this year.  Multiple myeloma panel was somewhat abnormal   12/13/2021 off of Eliquis .   Given the significant anemia recently, Eliquis  was not restarted It was recommended he continue PPI, continue metoprolol  underwent a bone marrow biopsy on 12/21/2021, this revealed hypercellular bone marrow with trilineage hematopoiesis.  Bone marrow did not show significant increase of plasma cell.  He has since been seen by [facility] oncology service on 01/10/2022.    PMH:   has a past medical history of Anemia, Anginal pain (HCC) (07/10/2018), Arthritis, Cancer (HCC), Chronic kidney disease, Colon polyps, Coronary artery disease, COVID-19, Dyspnea, Dysrhythmia (2020), GERD (gastroesophageal reflux disease), Hypercalcemia, Hyperlipidemia, Hypertension, Hypothyroidism, IgG gammopathy, Pneumonia, Presence of Watchman left atrial appendage closure device (06/29/2022), and PVD (peripheral vascular disease) (HCC).  PSH:    Past Surgical History:  Procedure Laterality Date    dental implant left  bottom-did have bleeding      ABDOMINAL AORTOGRAM N/A 01/15/2024   Procedure: ABDOMINAL AORTOGRAM;  Surgeon: Serene Gaile ORN, MD;  [facility]: [facility];  Service: Cardiovascular;  Laterality: N/A;   ABDOMINAL AORTOGRAM W/LOWER EXTREMITY Bilateral 06/26/2019   Procedure: ABDOMINAL AORTOGRAM W/LOWER EXTREMITY;  Surgeon: Court Dorn PARAS, MD;  Location: [facility];  Service: Cardiovascular;  Laterality: Bilateral;   ABDOMINAL AORTOGRAM W/LOWER EXTREMITY N/A 06/08/2020   Procedure: ABDOMINAL AORTOGRAM W/LOWER EXTREMITY;  Surgeon: Serene Gaile ORN, MD;  Location: [facility];  Service: Cardiovascular;  Laterality: N/A;   ABDOMINAL AORTOGRAM W/LOWER EXTREMITY N/A 11/28/2022   Procedure: ABDOMINAL AORTOGRAM W/LOWER EXTREMITY;  Surgeon: Serene Gaile ORN, MD;  [facility]: [facility];  Service: Cardiovascular;  Laterality: N/A;   BYPASS GRAFT     L leg   CARDIAC CATHETERIZATION  06/24/2018   COLONOSCOPY N/A 10/09/2021   Procedure: COLONOSCOPY;  Surgeon: Toledo, Ladell POUR, MD;  Location: [facility] ENDOSCOPY;  Service: Gastroenterology;  Laterality: N/A;   COLONOSCOPY WITH PROPOFOL  N/A 05/04/2015   Procedure: COLONOSCOPY WITH PROPOFOL ;  Surgeon: Gladis RAYMOND Mariner, MD;  [facility];  Service: Endoscopy;  Laterality: N/A;   CORONARY ARTERY BYPASS GRAFT N/A 07/11/2018   Procedure: CORONARY ARTERY BYPASS GRAFTING (CABG) x three, using left  internal mammary artery and right leg greater saphenous vein harvested endoscopically;  Surgeon: Lucas Dorise POUR, MD;  Location: [facility];  Service: Open Heart Surgery;  Laterality: N/A;   ENDARTERECTOMY FEMORAL Right 02/13/2020   Procedure: RIGHT ENDARTERECTOMY FEMORAL WITH BOVINE PATCH;  Surgeon: Serene Gaile ORN, MD;  Location: [facility];  Service: Vascular;  Laterality: Right;   ESOPHAGOGASTRODUODENOSCOPY N/A 10/09/2021   Procedure: ESOPHAGOGASTRODUODENOSCOPY (EGD);  Surgeon: Toledo, Ladell POUR, MD;  Location: [facility] ENDOSCOPY;  Service:  Gastroenterology;  Laterality: N/A;   GIVENS CAPSULE STUDY N/A 10/09/2021   Procedure: GIVENS CAPSULE STUDY;  Surgeon: Toledo, Ladell POUR, MD;  Location: [facility] ENDOSCOPY;  Service: Gastroenterology;  Laterality: N/A;   LEFT ATRIAL APPENDAGE OCCLUSION N/A 06/29/2022   Procedure: LEFT ATRIAL APPENDAGE OCCLUSION;  Surgeon: Wonda Sharper, MD;  Location: [facility] INVASIVE CV LAB;  Service: Cardiovascular;  Laterality: N/A;   LEFT HEART CATH AND CORONARY ANGIOGRAPHY N/A 06/24/2018   Procedure: LEFT HEART CATH AND CORONARY ANGIOGRAPHY;  Surgeon: Court Dorn PARAS, MD;  [facility];  Service: Cardiovascular;  Laterality: N/A;   LOWER EXTREMITY ANGIOGRAPHY Bilateral 10/25/2021   Procedure: Lower Extremity Angiography;  Surgeon: Serene Gaile ORN, MD;  [facility];  Service: Cardiovascular;  Laterality: Bilateral;   LOWER EXTREMITY ANGIOGRAPHY N/A 01/15/2024   Procedure: Lower Extremity Angiography;  Surgeon: Serene Gaile ORN, MD;  [facility];  Service: Cardiovascular;  Laterality: N/A;   LOWER EXTREMITY INTERVENTION N/A 01/15/2024   Procedure: LOWER EXTREMITY INTERVENTION;  Surgeon: Serene Gaile ORN, MD;  [facility];  Service: Cardiovascular;  Laterality: N/A;   PERIPHERAL VASCULAR ATHERECTOMY Right 06/08/2020   Procedure: PERIPHERAL VASCULAR ATHERECTOMY;  Surgeon: Serene Gaile ORN, MD;  [facility];  Service: Cardiovascular;  Laterality: Right;  superficial femoral   PERIPHERAL VASCULAR BALLOON ANGIOPLASTY Right 06/08/2020   Procedure: PERIPHERAL VASCULAR BALLOON ANGIOPLASTY;  Surgeon: Serene Gaile ORN, MD;  [facility];  Service: Cardiovascular;  Laterality: Right;  External iliac   PERIPHERAL VASCULAR BALLOON ANGIOPLASTY Right 11/28/2022   Procedure: PERIPHERAL VASCULAR BALLOON ANGIOPLASTY;  Surgeon: Serene Gaile ORN, MD;  [facility];  Service: Cardiovascular;  Laterality: Right;  common illiac    PERIPHERAL VASCULAR INTERVENTION Right 10/25/2021   Procedure: PERIPHERAL VASCULAR INTERVENTION;  Surgeon: Serene Gaile ORN, MD;  [facility];  Service: Cardiovascular;  Laterality: Right;  SFA   PERIPHERAL VASCULAR INTERVENTION Right 11/28/2022   Procedure: PERIPHERAL VASCULAR INTERVENTION;  Surgeon: Serene Gaile ORN, MD;  [facility];  Service: Cardiovascular;  Laterality: Right;   RENAL ANGIOGRAPHY Bilateral 10/25/2021   Procedure: RENAL ANGIOGRAPHY;  Surgeon: Serene Gaile ORN, MD;  [facility];  Service: Cardiovascular;  Laterality: Bilateral;   TEE WITHOUT CARDIOVERSION N/A 07/11/2018   Procedure: TRANSESOPHAGEAL ECHOCARDIOGRAM (TEE);  Surgeon: Lucas Dorise POUR, MD;  Location: [facility] OR;  Service: Open Heart Surgery;  Laterality: N/A;   TEE WITHOUT CARDIOVERSION N/A 06/29/2022   Procedure: TRANSESOPHAGEAL ECHOCARDIOGRAM (TEE);  Surgeon: Wonda Sharper, MD;  Location[facility] INVASIVE CV LAB;  Service: Cardiovascular;  Laterality: N/A;   TEE WITHOUT CARDIOVERSION N/A 08/11/2022   Procedure: TRANSESOPHAGEAL ECHOCARDIOGRAM (TEE);  Surgeon: Francyne Headland, MD;  Location: [facility];  Service: Cardiovascular;  Laterality: N/A;   THYROIDECTOMY, PARTIAL  2016   VASCULAR SURGERY      Current Outpatient Medications  Medication Sig Dispense Refill   ALPRAZolam  (XANAX ) 0.5 MG tablet Take 0.25 mg  by mouth at bedtime as needed for sleep or anxiety.     amLODipine  (NORVASC ) 10 MG tablet TAKE 1 TABLET BY MOUTH DAILY 90 tablet 1   benzonatate  (TESSALON ) 200 MG capsule Take 1 capsule (200 mg total) by mouth 3 (three) times daily as needed. 30 capsule 1   clopidogrel  (PLAVIX ) 75 MG tablet Take 1 tablet (75 mg total) by mouth every Monday, Wednesday, and Friday.     hydrochlorothiazide (HYDRODIURIL) 50 MG tablet Take 1 tablet by mouth daily.     levothyroxine  (SYNTHROID ) 75 MCG tablet TAKE 1 TABLET BY MOUTH 6 DAYS A WEEK AND1 & 1/2 TABLETS ONE DAY A WEEK AS DIRECTED.  100 tablet 3   metoprolol  tartrate (LOPRESSOR ) 25 MG tablet TAKE ONE TABLET BY MOUTH TWICE DAILY 90 tablet 0   Multiple Vitamins-Minerals (MULTIVITAMIN MEN 50+) TABS Take 1 tablet by mouth in the morning.     REPATHA  SURECLICK 140 MG/ML SOAJ INJECT 140MG  EVERY 14 DAYS 2 mL 11   tamsulosin  (FLOMAX ) 0.4 MG CAPS capsule Take 1 capsule (0.4 mg total) by mouth daily. 90 capsule 3   triamcinolone  cream (KENALOG ) 0.1 % Apply 1 Application topically 2 (two) times daily. 30 g 0   No current facility-administered medications for this visit.    Allergies:   Penicillins, Ace inhibitors, Aspirin , Celebrex [celecoxib], and Lipitor [atorvastatin]   Social History:  The patient  reports that he quit smoking about 15 years ago. His smoking use included cigarettes. He has never been exposed to tobacco smoke. He has quit using smokeless tobacco.  His smokeless tobacco use included chew. He reports current alcohol use. He reports that he does not use drugs.   Family History:   family history includes Diabetes in his mother; Stroke in his father.    Review of Systems: Review of Systems  Constitutional: Negative.   HENT: Negative.    Respiratory: Negative.    Cardiovascular: Negative.   Gastrointestinal: Negative.   Musculoskeletal: Negative.   Neurological: Negative.   Psychiatric/Behavioral: Negative.    All other systems reviewed and are negative.  PHYSICAL EXAM: VS:  BP (!) 120/50 (BP Location: Left Arm, Patient Position: Sitting, Cuff Size: Normal)   Pulse 66   Ht 5' 7 (1.702 m)   Wt 149 lb 4 oz (67.7 kg)   BMI 23.38 kg/m  , BMI Body mass index is 23.38 kg/m. Constitutional:  oriented to person, place, and time. No distress.  HENT:  Head: Grossly normal Eyes:  no discharge. No scleral icterus.  Neck: No JVD, no carotid bruits  Cardiovascular: Regular rate and rhythm, no murmurs appreciated Pulmonary/Chest: Clear to auscultation bilaterally, no wheezes or rales Abdominal: Soft.  no  distension.  no tenderness.  Musculoskeletal: Normal range of motion Neurological:  normal muscle tone. Coordination normal. No atrophy Skin: Skin warm and dry Psychiatric: normal affect, pleasant   Recent Labs: 01/15/2024: BUN 27; Creatinine, Ser 1.30; Potassium 3.8; Sodium 141 01/22/2024: Hemoglobin 12.9; Platelets 233.0    Lipid Panel Lab Results  Component Value Date   CHOL 139 10/23/2022   HDL 65.70 10/23/2022   LDLCALC 34 10/23/2022   TRIG 200.0 (H) 10/23/2022    Wt Readings from Last 3 Encounters:  04/29/24 149 lb 4 oz (67.7 kg)  03/03/24 150 lb (68 kg)  01/22/24 152 lb 12.8 oz (69.3 kg)     ASSESSMENT AND PLAN:  Problem List Items Addressed This Visit       Cardiology Problems   PAD (peripheral artery disease) (  HCC)   PAF (paroxysmal atrial fibrillation) (HCC)   Relevant Orders   EKG 12-Lead (Completed)   HTN (hypertension)   Relevant Orders   EKG 12-Lead (Completed)   Carotid stenosis   Renal artery stenosis (HCC)   CAD (coronary artery disease) - Primary   Relevant Orders   EKG 12-Lead (Completed)   Other Visit Diagnoses       S/P CABG (coronary artery bypass graft)       Relevant Orders   EKG 12-Lead (Completed)     Aortic valve disease           CAD with stable angina Currently with no symptoms of angina. No further workup at this time. Continue current medication regimen. Non-smoker, cholesterol at goal  PAD  lower extremity intervention by vascular February 2024 Tolerating Plavix , denies claudication symptoms Cholesterol at goal  Hyperlipidemia On PCSK9 inhibitor Cholesterol is at goal on the current lipid regimen. No changes to the medications were made.  Essential hypertension Blood pressure is well controlled on today's visit. No changes made to the medications.  Paroxysmal atrial fibrillation Off Eliquis  2.5 twice daily S/p watchman device, On Plavix , denies GI bleed     Signed, Velinda Lunger, M.D., Ph.D. [facility] [place], [place] [phone]

## 2024-04-29 ENCOUNTER — Encounter: Payer: Self-pay | Admitting: Cardiovascular Disease

## 2024-04-29 ENCOUNTER — Ambulatory Visit: Attending: Cardiovascular Disease | Admitting: Cardiovascular Disease

## 2024-04-29 VITALS — BP 120/50 | HR 66 | Ht 67.0 in | Wt 149.2 lb

## 2024-04-29 DIAGNOSIS — Z79899 Other long term (current) drug therapy: Secondary | ICD-10-CM | POA: Insufficient documentation

## 2024-04-29 DIAGNOSIS — I359 Nonrheumatic aortic valve disorder, unspecified: Secondary | ICD-10-CM | POA: Diagnosis not present

## 2024-04-29 DIAGNOSIS — I6523 Occlusion and stenosis of bilateral carotid arteries: Secondary | ICD-10-CM | POA: Insufficient documentation

## 2024-04-29 DIAGNOSIS — I739 Peripheral vascular disease, unspecified: Secondary | ICD-10-CM | POA: Insufficient documentation

## 2024-04-29 DIAGNOSIS — I480 Paroxysmal atrial fibrillation: Secondary | ICD-10-CM | POA: Diagnosis not present

## 2024-04-29 DIAGNOSIS — I25118 Atherosclerotic heart disease of native coronary artery with other forms of angina pectoris: Secondary | ICD-10-CM | POA: Insufficient documentation

## 2024-04-29 DIAGNOSIS — I10 Essential (primary) hypertension: Secondary | ICD-10-CM | POA: Diagnosis not present

## 2024-04-29 DIAGNOSIS — Z951 Presence of aortocoronary bypass graft: Secondary | ICD-10-CM | POA: Diagnosis not present

## 2024-04-29 DIAGNOSIS — I701 Atherosclerosis of renal artery: Secondary | ICD-10-CM | POA: Insufficient documentation

## 2024-04-29 NOTE — Patient Instructions (Addendum)
 Medication Instructions:  No changes  If you need a refill on your cardiac medications before your next appointment, please call your pharmacy.   Lab work: Lipids today  Testing/Procedures: No new testing needed  Follow-Up: At [facility], you and your health needs are our priority.  As part of our continuing mission to provide you with exceptional heart care, we have created designated Provider Care Teams.  These Care Teams include your primary Cardiologist (physician) and Advanced Practice Providers (APPs -  Physician Assistants and Nurse Practitioners) who all work together to provide you with the care you need, when you need it.  You will need a follow up appointment in 12 months  Providers on your designated Care Team:   Nicolasa Ducking, NP Eula Listen, PA-C Cadence Fransico Michael, [place]  COVID-19 Vaccine Information can be found at: [email or web address] For questions related to vaccine distribution or appointments, please email [email or web address] or call [phone].

## 2024-04-30 LAB — LIPID PANEL
Chol/HDL Ratio: 2.9 ratio (ref 0.0–5.0)
Cholesterol, Total: 123 mg/dL (ref 100–199)
HDL: 42 mg/dL (ref >39)
LDL Chol Calc (NIH): 52 mg/dL (ref 0–99)
Triglycerides: 172 mg/dL — ABNORMAL HIGH (ref 0–149)
VLDL Cholesterol Cal: 29 mg/dL (ref 5–40)

## 2024-05-10 ENCOUNTER — Ambulatory Visit: Payer: Self-pay | Admitting: Cardiovascular Disease

## 2024-05-14 DIAGNOSIS — R809 Proteinuria, unspecified: Secondary | ICD-10-CM | POA: Diagnosis not present

## 2024-05-14 DIAGNOSIS — N1831 Chronic kidney disease, stage 3a: Secondary | ICD-10-CM | POA: Diagnosis not present

## 2024-05-14 DIAGNOSIS — I10 Essential (primary) hypertension: Secondary | ICD-10-CM | POA: Diagnosis not present

## 2024-05-14 DIAGNOSIS — D631 Anemia in chronic kidney disease: Secondary | ICD-10-CM | POA: Diagnosis not present

## 2024-05-26 ENCOUNTER — Other Ambulatory Visit: Payer: Self-pay

## 2024-05-26 ENCOUNTER — Telehealth: Payer: Self-pay

## 2024-05-26 DIAGNOSIS — I70213 Atherosclerosis of native arteries of extremities with intermittent claudication, bilateral legs: Secondary | ICD-10-CM

## 2024-05-26 NOTE — Telephone Encounter (Signed)
 TRIAGE  Pt's wife called to report pt having new rest pain at night and increased claudication that started several weeks ago.  Per Dr. Russ note from 03/03/24:  He knows to contact me sooner should he develop a change in symptoms   Appt made for US  (05/27/94) and PA (05/28/24).  Patient aware of appts.

## 2024-05-27 ENCOUNTER — Ambulatory Visit (HOSPITAL_BASED_OUTPATIENT_CLINIC_OR_DEPARTMENT_OTHER)
Admission: RE | Admit: 2024-05-27 | Discharge: 2024-05-27 | Disposition: A | Discharge status: Home / Self Care | Source: Ambulatory Visit | Attending: Vascular Surgery | Admitting: Vascular Surgery

## 2024-05-27 ENCOUNTER — Ambulatory Visit (HOSPITAL_COMMUNITY)
Admission: RE | Admit: 2024-05-27 | Discharge: 2024-05-27 | Disposition: A | Discharge status: Home / Self Care | Source: Ambulatory Visit | Attending: Vascular Surgery | Admitting: Vascular Surgery

## 2024-05-27 DIAGNOSIS — I70213 Atherosclerosis of native arteries of extremities with intermittent claudication, bilateral legs: Secondary | ICD-10-CM | POA: Insufficient documentation

## 2024-05-27 LAB — VAS US ABI WITH/WO TBI: Left ABI: 1.23

## 2024-05-28 ENCOUNTER — Other Ambulatory Visit: Payer: Self-pay

## 2024-05-28 ENCOUNTER — Other Ambulatory Visit: Payer: Self-pay | Admitting: Cardiovascular Disease

## 2024-05-28 ENCOUNTER — Ambulatory Visit: Attending: Vascular Surgery | Admitting: Physician Assistant

## 2024-05-28 VITALS — BP 146/75 | HR 64 | Temp 97.5°F | Ht 66.0 in | Wt 153.4 lb

## 2024-05-28 DIAGNOSIS — T82398A Other mechanical complication of other vascular grafts, initial encounter: Secondary | ICD-10-CM

## 2024-05-28 DIAGNOSIS — I739 Peripheral vascular disease, unspecified: Secondary | ICD-10-CM | POA: Insufficient documentation

## 2024-05-28 DIAGNOSIS — I10 Essential (primary) hypertension: Secondary | ICD-10-CM

## 2024-05-28 DIAGNOSIS — I359 Nonrheumatic aortic valve disorder, unspecified: Secondary | ICD-10-CM

## 2024-05-29 NOTE — Progress Notes (Signed)
 Office Note   History of Present Illness   Ryan Pittman is a 83 y.o. (1941/03/27) male who presents as a triage visit.  He has a history of the following vascular inventions:  1) Left femoral to below-knee popliteal bypass graft by Dr. Marea 2) 02/13/2020: Right iliofemoral endarterectomy with bovine patch angioplasty  3) 06/08/2020: Drug-coated balloon angioplasty right common and external iliac artery.  Atherectomy with DCB, right superficial femoral artery  4) 10/25/2021: Right superficial femoral artery stent  5) 11/28/2022: Stent right popliteal artery, right external iliac artery, balloon angioplasty right common iliac artery  6) 01/15/2024: Left external iliac artery stenting, DCBA of left femoropopliteal bypass and popliteal artery, balloon angioplasty of right common and external iliac arteries  He returns today as a triage visit.  He says a couple of weeks ago he started experiencing intermittent nocturnal cramping in his left calf that would wake him up from his sleep.  He had no pain in his left foot.  Usually his pain resolves after moving his leg around or getting up and walking around.  He denies any injuries to his legs.  He denies any explicit rest pain in his feet.  He denies any tissue loss.  He denies any worsening claudication.  He says he is still walking at least 5-10,000 steps a day.   Current Outpatient Medications  Medication Sig Dispense Refill   ALPRAZolam  (XANAX ) 0.5 MG tablet Take 0.25 mg by mouth at bedtime as needed for sleep or anxiety.     amLODipine  (NORVASC ) 10 MG tablet TAKE 1 TABLET BY MOUTH DAILY 90 tablet 1   benzonatate  (TESSALON ) 200 MG capsule Take 1 capsule (200 mg total) by mouth 3 (three) times daily as needed. 30 capsule 1   clopidogrel  (PLAVIX ) 75 MG tablet Take 1 tablet (75 mg total) by mouth every Monday, Wednesday, and Friday.     hydrochlorothiazide (HYDRODIURIL) 50 MG tablet Take 1 tablet by mouth daily.     levothyroxine  (SYNTHROID ) 75  MCG tablet TAKE 1 TABLET BY MOUTH 6 DAYS A WEEK AND1 & 1/2 TABLETS ONE DAY A WEEK AS DIRECTED. 100 tablet 3   Multiple Vitamins-Minerals (MULTIVITAMIN MEN 50+) TABS Take 1 tablet by mouth in the morning.     REPATHA  SURECLICK 140 MG/ML SOAJ INJECT 140MG  EVERY 14 DAYS 2 mL 11   tamsulosin  (FLOMAX ) 0.4 MG CAPS capsule Take 1 capsule (0.4 mg total) by mouth daily. 90 capsule 3   triamcinolone  cream (KENALOG ) 0.1 % Apply 1 Application topically 2 (two) times daily. 30 g 0   metoprolol  tartrate (LOPRESSOR ) 25 MG tablet TAKE ONE TABLET BY MOUTH TWICE DAILY 90 tablet 3   No current facility-administered medications for this visit.    REVIEW OF SYSTEMS (negative unless checked):   Cardiac:  []  Chest pain or chest pressure? []  Shortness of breath upon activity? []  Shortness of breath when lying flat? []  Irregular heart rhythm?  Vascular:  []  Pain in calf, thigh, or hip brought on by walking? []  Pain in feet at night that wakes you up from your sleep? []  Blood clot in your veins? []  Leg swelling?  Pulmonary:  []  Oxygen  at home? []  Productive cough? []  Wheezing?  Neurologic:  []  Sudden weakness in arms or legs? []  Sudden numbness in arms or legs? []  Sudden onset of difficult speaking or slurred speech? []  Temporary loss of vision in one eye? []  Problems with dizziness?  Gastrointestinal:  []  Blood in stool? []  Vomited blood?  Genitourinary:  []  Burning when urinating? []  Blood in urine?  Psychiatric:  []  Major depression  Hematologic:  []  Bleeding problems? []  Problems with blood clotting?  Dermatologic:  []  Rashes or ulcers?  Constitutional:  []  Fever or chills?  Ear/Nose/Throat:  []  Change in hearing? []  Nose bleeds? []  Sore throat?  Musculoskeletal:  []  Back pain? []  Joint pain? []  Muscle pain?   Physical Examination   Vitals:   05/28/24 0945  BP: (!) 146/75  Pulse: 64  Temp: (!) 97.5 F (36.4 C)  TempSrc: Temporal  Weight: 153 lb 6.4 oz (69.6 kg)   Height: 5' 6 (1.676 m)   Body mass index is 24.76 kg/m.  General:  WDWN in NAD; vital signs documented above Gait: Not observed HENT: WNL, normocephalic Pulmonary: normal non-labored breathing , without rales, rhonchi,  wheezing Cardiac: regular Abdomen: soft, NT, no masses Skin: without rashes Vascular Exam/Pulses: 2+ right femoral pulse, 1+ left femoral pulse.  Nonpalpable pedal pulses, monophasic DP/PT Doppler signals bilaterally Extremities: without ischemic changes, without gangrene , without cellulitis; without open wounds;  Musculoskeletal: no muscle wasting or atrophy  Neurologic: A&O X 3;  No focal weakness or paresthesias are detected Psychiatric:  The pt has Normal affect.  Non-Invasive Vascular imaging   ABI (05/27/2024) R:  ABI: Ryan Pittman (Ryan Pittman),  PT: mono DP: mono TBI: 0.23 L:  ABI: 1.23 (Ryan Pittman),  PT: mono DP: mono TBI: 0.3  BLE Arterial Duplex (05/27/2024) +----------+--------+-----+---------------+----------+-------------------+  RIGHT    PSV cm/sRatioStenosis       Waveform  Comments             +----------+--------+-----+---------------+----------+-------------------+  EIA [name]          50-74% stenosis          >50%, low end range  +----------+--------+-----+---------------+----------+-------------------+  CFA Prox  215          50-74% stenosisbiphasic                       +----------+--------+-----+---------------+----------+-------------------+  DFA      137                         monophasic                     +----------+--------+-----+---------------+----------+-------------------+  TP Trunk  58                          monophasic                     +----------+--------+-----+---------------+----------+-------------------+       Right Stent(s):  +--------------------------------------+--------+--------+-----------+-----  ----+  Ostial SFA to Below-Knee Popliteal    PSV cm/sStenosisWaveform    Comments    Artery                                                                       +--------------------------------------+--------+--------+-----------+-----  ----+  Prox to Stent                         133              multiphasicturbulent  +--------------------------------------+--------+--------+-----------+-----  ----+  Proximal Stent                        65              monophasic             +--------------------------------------+--------+--------+-----------+-----  ----+  Mid Stent                             65              monophasic             +--------------------------------------+--------+--------+-----------+-----  ----+  Distal Stent                          90              biphasic               +--------------------------------------+--------+--------+-----------+-----  ----+  Distal to Stent                       93              multiphasic            +--------------------------------------+--------+--------+-----------+-----    +----------+--------+-----+---------------+----------+--------------+  LEFT     PSV cm/sRatioStenosis       Waveform  Comments        +----------+--------+-----+---------------+----------+--------------+  EIA [id number]          50-74% stenosisbiphasic  >50%            +----------+--------+-----+---------------+----------+--------------+  CFA Prox  192          30-49% stenosismonophasichigh end range  +----------+--------+-----+---------------+----------+--------------+  DFA      175                         biphasic  turbulent       +----------+--------+-----+---------------+----------+--------------+     Left Graft #1: Femoropopliteal Bypass  +--------------------+--------+---------------+-----------+--------+                     PSV cm/sStenosis       Waveform   Comments  +--------------------+--------+---------------+-----------+--------+  Inflow              231     50-74% stenosismonophasic           +--------------------+--------+---------------+-----------+--------+  Proximal Anastomosis64                     monophasic           +--------------------+--------+---------------+-----------+--------+  Proximal Graft      53                     multiphasic          +--------------------+--------+---------------+-----------+--------+  Mid Graft           47                     monophasic           +--------------------+--------+---------------+-----------+--------+  Distal Graft        36                     monophasic           +--------------------+--------+---------------+-----------+--------+  Distal Anastomosis  42  multiphasic          +--------------------+--------+---------------+-----------+--------+  Outflow            70                     monophasic           +--------------------+--------+---------------+-----------+--------+   Medical Decision Making   Ryan Pittman is a 83 y.o. male who presents for surveillance of PAD  Based on the patient's vascular studies, his ABIs are noncompressible bilaterally.  He has monophasic tibial vessel flow bilaterally Arterial duplex of the right lower extremity demonstrates 50% stenosis of the distal external iliac artery and proximal common femoral artery.  This does not appear flow-limiting.  There is a patent right SFA pop stent without stenosis Arterial duplex of the left lower extremity demonstrates greater than 50% stenosis of the distal external iliac artery and near 50% stenosis of the proximal common femoral artery.  There is also 50 to 74% stenosis at the inflow of his bypass graft with sluggish velocities less than 40cm/s in the distal graft.  His iliac stenosis has worsened since his last ultrasound 3 months ago.  The sluggish velocities within his bypass suggest higher risk for thrombosis The patient says over the past couple  of weeks he has been experiencing intermittent nocturnal cramping in his left calf that wakes him up from his sleep.  Typically his pain is relieved by movement or walking.  He denies any explicit symptoms of rest pain.  He denies any worsening claudication.  He has no tissue loss. On exam he has a 2+ right femoral pulse and 1+ left femoral pulse.  His diminished femoral pulse on the left could be due to worsening iliac stenosis.  He does have brisk monophasic DP/PT Doppler signals bilaterally I have explained to the patient that I am not sure if his worsening stenosis in the left lower extremity is contributing to his nighttime cramping, however he would benefit from repeat angiogram.  He does have worsening stenosis of the distal left external iliac artery and sluggish velocities within his bypass again, which threatens his bypass graft.  We will schedule the patient for left lower extremity angiogram with possible iliac intervention and possible femoropopliteal bypass intervention within the next 2 weeks with Dr. Serene Ahmed Holster PA-C [facility] Office: [phone]  Clinic MD: Sheree

## 2024-05-29 NOTE — H&P (View-Only) (Signed)
 Office Note   History of Present Illness   Ryan Pittman is a 83 y.o. (1941/03/27) male who presents as a triage visit.  He has a history of the following vascular inventions:  1) Left femoral to below-knee popliteal bypass graft by Dr. Marea 2) 02/13/2020: Right iliofemoral endarterectomy with bovine patch angioplasty  3) 06/08/2020: Drug-coated balloon angioplasty right common and external iliac artery.  Atherectomy with DCB, right superficial femoral artery  4) 10/25/2021: Right superficial femoral artery stent  5) 11/28/2022: Stent right popliteal artery, right external iliac artery, balloon angioplasty right common iliac artery  6) 01/15/2024: Left external iliac artery stenting, DCBA of left femoropopliteal bypass and popliteal artery, balloon angioplasty of right common and external iliac arteries  He returns today as a triage visit.  He says a couple of weeks ago he started experiencing intermittent nocturnal cramping in his left calf that would wake him up from his sleep.  He had no pain in his left foot.  Usually his pain resolves after moving his leg around or getting up and walking around.  He denies any injuries to his legs.  He denies any explicit rest pain in his feet.  He denies any tissue loss.  He denies any worsening claudication.  He says he is still walking at least 5-10,000 steps a day.   Current Outpatient Medications  Medication Sig Dispense Refill   ALPRAZolam  (XANAX ) 0.5 MG tablet Take 0.25 mg by mouth at bedtime as needed for sleep or anxiety.     amLODipine  (NORVASC ) 10 MG tablet TAKE 1 TABLET BY MOUTH DAILY 90 tablet 1   benzonatate  (TESSALON ) 200 MG capsule Take 1 capsule (200 mg total) by mouth 3 (three) times daily as needed. 30 capsule 1   clopidogrel  (PLAVIX ) 75 MG tablet Take 1 tablet (75 mg total) by mouth every Monday, Wednesday, and Friday.     hydrochlorothiazide (HYDRODIURIL) 50 MG tablet Take 1 tablet by mouth daily.     levothyroxine  (SYNTHROID ) 75  MCG tablet TAKE 1 TABLET BY MOUTH 6 DAYS A WEEK AND1 & 1/2 TABLETS ONE DAY A WEEK AS DIRECTED. 100 tablet 3   Multiple Vitamins-Minerals (MULTIVITAMIN MEN 50+) TABS Take 1 tablet by mouth in the morning.     REPATHA  SURECLICK 140 MG/ML SOAJ INJECT 140MG  EVERY 14 DAYS 2 mL 11   tamsulosin  (FLOMAX ) 0.4 MG CAPS capsule Take 1 capsule (0.4 mg total) by mouth daily. 90 capsule 3   triamcinolone  cream (KENALOG ) 0.1 % Apply 1 Application topically 2 (two) times daily. 30 g 0   metoprolol  tartrate (LOPRESSOR ) 25 MG tablet TAKE ONE TABLET BY MOUTH TWICE DAILY 90 tablet 3   No current facility-administered medications for this visit.    REVIEW OF SYSTEMS (negative unless checked):   Cardiac:  []  Chest pain or chest pressure? []  Shortness of breath upon activity? []  Shortness of breath when lying flat? []  Irregular heart rhythm?  Vascular:  []  Pain in calf, thigh, or hip brought on by walking? []  Pain in feet at night that wakes you up from your sleep? []  Blood clot in your veins? []  Leg swelling?  Pulmonary:  []  Oxygen  at home? []  Productive cough? []  Wheezing?  Neurologic:  []  Sudden weakness in arms or legs? []  Sudden numbness in arms or legs? []  Sudden onset of difficult speaking or slurred speech? []  Temporary loss of vision in one eye? []  Problems with dizziness?  Gastrointestinal:  []  Blood in stool? []  Vomited blood?  Genitourinary:  []  Burning when urinating? []  Blood in urine?  Psychiatric:  []  Major depression  Hematologic:  []  Bleeding problems? []  Problems with blood clotting?  Dermatologic:  []  Rashes or ulcers?  Constitutional:  []  Fever or chills?  Ear/Nose/Throat:  []  Change in hearing? []  Nose bleeds? []  Sore throat?  Musculoskeletal:  []  Back pain? []  Joint pain? []  Muscle pain?   Physical Examination   Vitals:   05/28/24 0945  BP: (!) 146/75  Pulse: 64  Temp: (!) 97.5 F (36.4 C)  TempSrc: Temporal  Weight: 153 lb 6.4 oz (69.6 kg)   Height: 5' 6 (1.676 m)   Body mass index is 24.76 kg/m.  General:  WDWN in NAD; vital signs documented above Gait: Not observed HENT: WNL, normocephalic Pulmonary: normal non-labored breathing , without rales, rhonchi,  wheezing Cardiac: regular Abdomen: soft, NT, no masses Skin: without rashes Vascular Exam/Pulses: 2+ right femoral pulse, 1+ left femoral pulse.  Nonpalpable pedal pulses, monophasic DP/PT Doppler signals bilaterally Extremities: without ischemic changes, without gangrene , without cellulitis; without open wounds;  Musculoskeletal: no muscle wasting or atrophy  Neurologic: A&O X 3;  No focal weakness or paresthesias are detected Psychiatric:  The pt has Normal affect.  Non-Invasive Vascular imaging   ABI (05/27/2024) R:  ABI: [name],  PT: mono DP: mono TBI: 0.23 L:  ABI: 1.23 ([name]),  PT: mono DP: mono TBI: 0.3  BLE Arterial Duplex (05/27/2024) +----------+--------+-----+---------------+----------+-------------------+  RIGHT    PSV cm/sRatioStenosis       Waveform  Comments             +----------+--------+-----+---------------+----------+-------------------+  EIA [name]          50-74% stenosis          >50%, low end range  +----------+--------+-----+---------------+----------+-------------------+  CFA Prox  215          50-74% stenosisbiphasic                       +----------+--------+-----+---------------+----------+-------------------+  DFA      137                         monophasic                     +----------+--------+-----+---------------+----------+-------------------+  TP Trunk  58                          monophasic                     +----------+--------+-----+---------------+----------+-------------------+       Right Stent(s):  +--------------------------------------+--------+--------+-----------+-----  ----+  Ostial SFA to Below-Knee Popliteal    PSV cm/sStenosisWaveform    Comments    Artery                                                                       +--------------------------------------+--------+--------+-----------+-----  ----+  Prox to Stent                         133              multiphasicturbulent  +--------------------------------------+--------+--------+-----------+-----  ----+  Proximal Stent                        65              monophasic             +--------------------------------------+--------+--------+-----------+-----  ----+  Mid Stent                             65              monophasic             +--------------------------------------+--------+--------+-----------+-----  ----+  Distal Stent                          90              biphasic               +--------------------------------------+--------+--------+-----------+-----  ----+  Distal to Stent                       93              multiphasic            +--------------------------------------+--------+--------+-----------+-----    +----------+--------+-----+---------------+----------+--------------+  LEFT     PSV cm/sRatioStenosis       Waveform  Comments        +----------+--------+-----+---------------+----------+--------------+  EIA [id number]          50-74% stenosisbiphasic  >50%            +----------+--------+-----+---------------+----------+--------------+  CFA Prox  192          30-49% stenosismonophasichigh end range  +----------+--------+-----+---------------+----------+--------------+  DFA      175                         biphasic  turbulent       +----------+--------+-----+---------------+----------+--------------+     Left Graft #1: Femoropopliteal Bypass  +--------------------+--------+---------------+-----------+--------+                     PSV cm/sStenosis       Waveform   Comments  +--------------------+--------+---------------+-----------+--------+  Inflow              231     50-74% stenosismonophasic           +--------------------+--------+---------------+-----------+--------+  Proximal Anastomosis64                     monophasic           +--------------------+--------+---------------+-----------+--------+  Proximal Graft      53                     multiphasic          +--------------------+--------+---------------+-----------+--------+  Mid Graft           47                     monophasic           +--------------------+--------+---------------+-----------+--------+  Distal Graft        36                     monophasic           +--------------------+--------+---------------+-----------+--------+  Distal Anastomosis  42  multiphasic          +--------------------+--------+---------------+-----------+--------+  Outflow            70                     monophasic           +--------------------+--------+---------------+-----------+--------+   Medical Decision Making   Ryan Pittman is a 83 y.o. male who presents for surveillance of PAD  Based on the patient's vascular studies, his ABIs are noncompressible bilaterally.  He has monophasic tibial vessel flow bilaterally Arterial duplex of the right lower extremity demonstrates 50% stenosis of the distal external iliac artery and proximal common femoral artery.  This does not appear flow-limiting.  There is a patent right SFA pop stent without stenosis Arterial duplex of the left lower extremity demonstrates greater than 50% stenosis of the distal external iliac artery and near 50% stenosis of the proximal common femoral artery.  There is also 50 to 74% stenosis at the inflow of his bypass graft with sluggish velocities less than 40cm/s in the distal graft.  His iliac stenosis has worsened since his last ultrasound 3 months ago.  The sluggish velocities within his bypass suggest higher risk for thrombosis The patient says over the past couple  of weeks he has been experiencing intermittent nocturnal cramping in his left calf that wakes him up from his sleep.  Typically his pain is relieved by movement or walking.  He denies any explicit symptoms of rest pain.  He denies any worsening claudication.  He has no tissue loss. On exam he has a 2+ right femoral pulse and 1+ left femoral pulse.  His diminished femoral pulse on the left could be due to worsening iliac stenosis.  He does have brisk monophasic DP/PT Doppler signals bilaterally I have explained to the patient that I am not sure if his worsening stenosis in the left lower extremity is contributing to his nighttime cramping, however he would benefit from repeat angiogram.  He does have worsening stenosis of the distal left external iliac artery and sluggish velocities within his bypass again, which threatens his bypass graft.  We will schedule the patient for left lower extremity angiogram with possible iliac intervention and possible femoropopliteal bypass intervention within the next 2 weeks with Dr. Serene Ahmed Holster PA-C [facility] Office: [phone]  Clinic MD: Sheree

## 2024-06-04 DIAGNOSIS — Z23 Encounter for immunization: Secondary | ICD-10-CM | POA: Diagnosis not present

## 2024-06-09 ENCOUNTER — Ambulatory Visit: Payer: Self-pay

## 2024-06-09 ENCOUNTER — Encounter: Payer: Self-pay | Admitting: Oncology

## 2024-06-09 NOTE — Telephone Encounter (Signed)
 Since he is scheduled tomorrow, I would get labs done at the OV (if needed).  Thanks.

## 2024-06-09 NOTE — Telephone Encounter (Signed)
 Please contact pt if PCP would like labs done prior to OV.  FYI Only or Action Required?: Action required by provider: clinical question for provider.  Patient was last seen in primary care on 01/22/2024 by Cleatus Arlyss RAMAN, MD.  Called Nurse Triage reporting Fatigue.  Symptoms began several days ago.  Interventions attempted: Rest, hydration, or home remedies.  Symptoms are: gradually improving.  Triage Disposition: See Physician Within 24 Hours  Patient/caregiver understands and will follow disposition?: Yes  Reason for Disposition  [1] MODERATE weakness (e.g., interferes with work, school, normal activities) AND [2] persists > 3 days  Answer Assessment - Initial Assessment Questions Patient had nausea and dry heaves on 06/07/24-06/08/24. States has appt scheduled on the 14th to evaluate his leg stents.  States patient has been experiencing fatigue and poor appetite that began 06/07/24. One episode of diarrhea, unable to describe appearance of stool.   Scheduled soonest available appointment. Patient or wife will call back if symptoms worsen, if nausea returns or if he develops blood in his stool with a dark, tarry or coffee grind appearance.   1. DESCRIPTION: Describe how you are feeling.     Weakness and poor appetite  2. SEVERITY: How bad is it?  Can you stand and walk?     Able to get up and walk without assistance  3. ONSET: When did these symptoms begin? (e.g., hours, days, weeks, months)     06/07/24  4. CAUSE: What do you think is causing the weakness or fatigue? (e.g., not drinking enough fluids, medical problem, trouble sleeping)     Unknown  Protocols used: Weakness (Generalized) and Fatigue-A-AH

## 2024-06-10 ENCOUNTER — Ambulatory Visit (INDEPENDENT_AMBULATORY_CARE_PROVIDER_SITE_OTHER): Admitting: Family Medicine

## 2024-06-10 ENCOUNTER — Encounter: Payer: Self-pay | Admitting: Family Medicine

## 2024-06-10 VITALS — BP 128/74 | HR 76 | Temp 98.5°F | Ht 66.0 in | Wt 152.8 lb

## 2024-06-10 DIAGNOSIS — R5383 Other fatigue: Secondary | ICD-10-CM | POA: Diagnosis not present

## 2024-06-10 DIAGNOSIS — R1084 Generalized abdominal pain: Secondary | ICD-10-CM | POA: Diagnosis not present

## 2024-06-10 LAB — CBC WITH DIFFERENTIAL/PLATELET
Basophils Absolute: 0.1 K/uL (ref 0.0–0.1)
Basophils Relative: 0.8 % (ref 0.0–3.0)
Eosinophils Absolute: 0.2 K/uL (ref 0.0–0.7)
Eosinophils Relative: 1.6 % (ref 0.0–5.0)
HCT: 36.5 % — ABNORMAL LOW (ref 39.0–52.0)
Hemoglobin: 12.3 g/dL — ABNORMAL LOW (ref 13.0–17.0)
Lymphocytes Relative: 9.9 % — ABNORMAL LOW (ref 12.0–46.0)
Lymphs Abs: 1.1 K/uL (ref 0.7–4.0)
MCHC: 33.6 g/dL (ref 30.0–36.0)
MCV: 90.9 fl (ref 78.0–100.0)
Monocytes Absolute: 0.7 K/uL (ref 0.1–1.0)
Monocytes Relative: 6.6 % (ref 3.0–12.0)
Neutro Abs: 8.9 K/uL — ABNORMAL HIGH (ref 1.4–7.7)
Neutrophils Relative %: 81.1 % — ABNORMAL HIGH (ref 43.0–77.0)
Platelets: 192 K/uL (ref 150.0–400.0)
RBC: 4.02 Mil/uL — ABNORMAL LOW (ref 4.22–5.81)
RDW: 15.3 % (ref 11.5–15.5)
WBC: 11 K/uL — ABNORMAL HIGH (ref 4.0–10.5)

## 2024-06-10 LAB — LIPASE: Lipase: 22 U/L (ref 11.0–59.0)

## 2024-06-10 LAB — COMPREHENSIVE METABOLIC PANEL WITH GFR
ALT: 8 U/L (ref 0–53)
AST: 11 U/L (ref 0–37)
Albumin: 3.7 g/dL (ref 3.5–5.2)
Alkaline Phosphatase: 42 U/L (ref 39–117)
BUN: 24 mg/dL — ABNORMAL HIGH (ref 6–23)
CO2: 28 meq/L (ref 19–32)
Calcium: 8.9 mg/dL (ref 8.4–10.5)
Chloride: 101 meq/L (ref 96–112)
Creatinine, Ser: 1.24 mg/dL (ref 0.40–1.50)
GFR: 53.12 mL/min — ABNORMAL LOW (ref >60.00)
Glucose, Bld: 185 mg/dL — ABNORMAL HIGH (ref 70–99)
Potassium: 4 meq/L (ref 3.5–5.1)
Sodium: 138 meq/L (ref 135–145)
Total Bilirubin: 0.5 mg/dL (ref 0.2–1.2)
Total Protein: 5.9 g/dL — ABNORMAL LOW (ref 6.0–8.3)

## 2024-06-10 LAB — TSH: TSH: 2.86 u[IU]/mL (ref 0.35–5.50)

## 2024-06-10 NOTE — Patient Instructions (Signed)
 Go to the lab on the way out.   If you have mychart we'll likely use that to update you.    Take care.  Glad to see you.

## 2024-06-10 NOTE — Progress Notes (Unsigned)
 He has vascular eval pending for next week. He has some cramping, about the same, noted at night.    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 No cramping with walking.    Taking plavix  MWF. No black stools.  No BRBPR.  Some fatigue.   Starting about 06/06/24 he had abd pain.  06/07/24 pain improved.  He was nauseated at the time.  Didn't vomit.  Had loose stools in the meantime, getting some better in the meantime.  His symptoms are clearly better than 10//25.  Meds, vitals, and allergies reviewed.   ROS: Per HPI unless specifically indicated in ROS section   Nad N[place] Neck supple, no LA Rrr SEM noted Ctab Abd soft, not ttp No BLE edema.  Skin well-perfused.

## 2024-06-11 DIAGNOSIS — R5383 Other fatigue: Secondary | ICD-10-CM | POA: Insufficient documentation

## 2024-06-11 DIAGNOSIS — R1084 Generalized abdominal pain: Secondary | ICD-10-CM | POA: Insufficient documentation

## 2024-06-11 NOTE — Assessment & Plan Note (Signed)
 Of unclear source.  Reasonable to check his labs given his history of anemia.  At this point okay for outpatient follow-up.  He agrees to plan.

## 2024-06-11 NOTE — Assessment & Plan Note (Signed)
 Previously noted, clearly better in the meantime.  Unclear if this was a transient benign issues such as a viral gastroenteritis.  At this point okay for outpatient follow-up.  See notes on labs.

## 2024-06-15 ENCOUNTER — Ambulatory Visit: Payer: Self-pay | Admitting: Family Medicine

## 2024-06-15 DIAGNOSIS — D500 Iron deficiency anemia secondary to blood loss (chronic): Secondary | ICD-10-CM

## 2024-06-16 NOTE — Telephone Encounter (Signed)
 Copied from CRM [id number]. Topic: Clinical - Lab/Test Results >> Jun 16, 2024 11:09 AM Franky GRADE wrote: Reason for CRM: Patient is returning a call he received from [facility], I advised patient of the message left by Dr.Duncan regarding lab results. Patient understood and states he is feeling well and not having any abdominal symptoms. He scheduled a lab appointment for 08/11/2024 to recheck CBC.

## 2024-06-17 ENCOUNTER — Encounter (HOSPITAL_COMMUNITY)
Admission: RE | Disposition: A | Discharge status: Home / Self Care | Payer: Self-pay | Source: Home / Self Care | Attending: Surgery

## 2024-06-17 ENCOUNTER — Other Ambulatory Visit: Payer: Self-pay

## 2024-06-17 ENCOUNTER — Ambulatory Visit (HOSPITAL_COMMUNITY)
Admission: RE | Admit: 2024-06-17 | Discharge: 2024-06-17 | Disposition: A | Discharge status: Home / Self Care | Attending: Surgery | Admitting: Surgery

## 2024-06-17 DIAGNOSIS — I708 Atherosclerosis of other arteries: Secondary | ICD-10-CM | POA: Insufficient documentation

## 2024-06-17 DIAGNOSIS — Z95828 Presence of other vascular implants and grafts: Secondary | ICD-10-CM

## 2024-06-17 DIAGNOSIS — I70203 Unspecified atherosclerosis of native arteries of extremities, bilateral legs: Secondary | ICD-10-CM | POA: Diagnosis not present

## 2024-06-17 DIAGNOSIS — T82398A Other mechanical complication of other vascular grafts, initial encounter: Secondary | ICD-10-CM

## 2024-06-17 DIAGNOSIS — Y832 Surgical operation with anastomosis, bypass or graft as the cause of abnormal reaction of the patient, or of later complication, without mention of misadventure at the time of the procedure: Secondary | ICD-10-CM | POA: Diagnosis not present

## 2024-06-17 DIAGNOSIS — I70213 Atherosclerosis of native arteries of extremities with intermittent claudication, bilateral legs: Secondary | ICD-10-CM | POA: Insufficient documentation

## 2024-06-17 DIAGNOSIS — T82856A Stenosis of peripheral vascular stent, initial encounter: Secondary | ICD-10-CM | POA: Diagnosis not present

## 2024-06-17 HISTORY — PX: LOWER EXTREMITY INTERVENTION: CATH118252

## 2024-06-17 HISTORY — PX: ABDOMINAL AORTOGRAM: CATH118222

## 2024-06-17 HISTORY — PX: LOWER EXTREMITY ANGIOGRAPHY: CATH118251

## 2024-06-17 LAB — POCT I-STAT, CHEM 8
BUN: 29 mg/dL — ABNORMAL HIGH (ref 8–23)
Calcium, Ion: 1.19 mmol/L (ref 1.15–1.40)
Chloride: 105 mmol/L (ref 98–111)
Creatinine, Ser: 1.5 mg/dL — ABNORMAL HIGH (ref 0.61–1.24)
Glucose, Bld: 102 mg/dL — ABNORMAL HIGH (ref 70–99)
HCT: 37 % — ABNORMAL LOW (ref 39.0–52.0)
Hemoglobin: 12.6 g/dL — ABNORMAL LOW (ref 13.0–17.0)
Potassium: 3.8 mmol/L (ref 3.5–5.1)
Sodium: 140 mmol/L (ref 135–145)
TCO2: 24 mmol/L (ref 22–32)

## 2024-06-17 SURGERY — ABDOMINAL AORTOGRAM
Anesthesia: LOCAL

## 2024-06-17 MED ORDER — FENTANYL CITRATE (PF) 100 MCG/2ML IJ SOLN
INTRAMUSCULAR | Status: DC | PRN
  Administered 2024-06-17: 25 ug via INTRAVENOUS
  Administered 2024-06-17: 50 ug via INTRAVENOUS

## 2024-06-17 MED ORDER — HYDRALAZINE HCL 20 MG/ML IJ SOLN: 5.0000 mg | INTRAMUSCULAR | Status: DC | PRN

## 2024-06-17 MED ORDER — LABETALOL HCL 5 MG/ML IV SOLN: 10.0000 mg | INTRAVENOUS | Status: DC | PRN

## 2024-06-17 MED ORDER — SODIUM CHLORIDE 0.9 % WEIGHT BASED INFUSION: 1.0000 mL/kg/h | INTRAVENOUS | Status: DC

## 2024-06-17 MED ORDER — MIDAZOLAM HCL 2 MG/2ML IJ SOLN
INTRAMUSCULAR | Status: DC | PRN
  Administered 2024-06-17: 2 mg via INTRAVENOUS
  Administered 2024-06-17: 1 mg via INTRAVENOUS

## 2024-06-17 MED ORDER — HEPARIN SODIUM (PORCINE) 1000 UNIT/ML IJ SOLN
INTRAMUSCULAR | Status: AC
  Filled 2024-06-17: qty 10

## 2024-06-17 MED ORDER — LIDOCAINE HCL (PF) 1 % IJ SOLN
INTRAMUSCULAR | Status: DC | PRN
  Administered 2024-06-17: 10 mL via INTRADERMAL

## 2024-06-17 MED ORDER — HEPARIN (PORCINE) IN NACL 1000-0.9 UT/500ML-% IV SOLN
INTRAVENOUS | Status: DC | PRN
  Administered 2024-06-17 (×2): 500 mL

## 2024-06-17 MED ORDER — HEPARIN SODIUM (PORCINE) 1000 UNIT/ML IJ SOLN
INTRAMUSCULAR | Status: DC | PRN
  Administered 2024-06-17: 5000 [IU] via INTRAVENOUS

## 2024-06-17 MED ORDER — CLOPIDOGREL BISULFATE 75 MG PO TABS
ORAL_TABLET | ORAL | Status: AC
Start: 2024-06-17 — End: 2024-06-17
  Filled 2024-06-17: qty 1

## 2024-06-17 MED ORDER — CLOPIDOGREL BISULFATE 300 MG PO TABS
ORAL_TABLET | ORAL | Status: DC | PRN
  Administered 2024-06-17: 75 mg via ORAL

## 2024-06-17 MED ORDER — SODIUM CHLORIDE 0.9% FLUSH: 3.0000 mL | Freq: Two times a day (BID) | INTRAVENOUS | Status: DC

## 2024-06-17 MED ORDER — FENTANYL CITRATE (PF) 100 MCG/2ML IJ SOLN
INTRAMUSCULAR | Status: AC
  Filled 2024-06-17: qty 2

## 2024-06-17 MED ORDER — OXYCODONE HCL 5 MG PO TABS: 5.0000 mg | ORAL_TABLET | ORAL | Status: DC | PRN

## 2024-06-17 MED ORDER — IODIXANOL 320 MG/ML IV SOLN
INTRAVENOUS | Status: DC | PRN
  Administered 2024-06-17: 85 mL

## 2024-06-17 MED ORDER — ACETAMINOPHEN 325 MG PO TABS: 650.0000 mg | ORAL_TABLET | ORAL | Status: DC | PRN

## 2024-06-17 MED ORDER — SODIUM CHLORIDE 0.9 % IV SOLN: INTRAVENOUS | Status: DC

## 2024-06-17 MED ORDER — ONDANSETRON HCL 4 MG/2ML IJ SOLN: 4.0000 mg | Freq: Four times a day (QID) | INTRAMUSCULAR | Status: DC | PRN

## 2024-06-17 MED ORDER — MIDAZOLAM HCL 2 MG/2ML IJ SOLN
INTRAMUSCULAR | Status: AC
  Filled 2024-06-17: qty 2

## 2024-06-17 MED ORDER — LIDOCAINE HCL (PF) 1 % IJ SOLN
INTRAMUSCULAR | Status: AC
  Filled 2024-06-17: qty 30

## 2024-06-17 MED ORDER — SODIUM CHLORIDE 0.9% FLUSH: 3.0000 mL | INTRAVENOUS | Status: DC | PRN

## 2024-06-17 MED ORDER — SODIUM CHLORIDE 0.9 % IV SOLN: 250.0000 mL | INTRAVENOUS | Status: DC | PRN

## 2024-06-17 MED ORDER — MORPHINE SULFATE (PF) 2 MG/ML IV SOLN: 2.0000 mg | INTRAVENOUS | Status: DC | PRN

## 2024-06-17 SURGICAL SUPPLY — 15 items
CATH NAVICROSS ST 65CM (CATHETERS) IMPLANT
CATH OMNI FLUSH 5F 65CM (CATHETERS) IMPLANT
CLOSURE MYNX CONTROL 6F/7F (Vascular Products) IMPLANT
DCB RANGER 7.0X80 135 (BALLOONS) IMPLANT
KIT ENCORE 40 (KITS) IMPLANT
KIT MICROPUNCTURE NIT STIFF (SHEATH) IMPLANT
KIT SINGLE USE MANIFOLD (KITS) IMPLANT
SET ATX-X65L (MISCELLANEOUS) IMPLANT
SHEATH PINNACLE 5F 10CM (SHEATH) IMPLANT
SHEATH PINNACLE 6F 10CM (SHEATH) IMPLANT
STOPCOCK MORSE 400PSI 3WAY (MISCELLANEOUS) IMPLANT
TRAY PV CATH (CUSTOM PROCEDURE TRAY) ×1 IMPLANT
WIRE BENTSON .035X145CM (WIRE) IMPLANT
WIRE G V18X300CM (WIRE) IMPLANT
WIRE HI TORQ VERSACORE 300 (WIRE) IMPLANT

## 2024-06-17 NOTE — Progress Notes (Signed)
 Patient out of bed and ambulated to the bathroom. Right groin site dressing clean, dry, and intact. No bleeding or hematoma noted. Patient assisted with getting dressed and no changes to sites. No hematoma or bleeding noted at discharge. Discharge instructions given to the patient and wife with no concerns voiced.

## 2024-06-17 NOTE — Op Note (Signed)
 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  Ultrasound-guided access, right femoral artery  2.  Abdominal aortogram  3.  Bilateral lower extremity angiogram  4.  Selective injection with cath in left external iliac artery  5.  Drug-coated balloon angioplasty, right common iliac artery  6.  Drug-coated balloon angioplasty right external iliac artery  7.  Conscious sedation, 46 minutes  8.  Closure device, Mynx   Indications: Patient has a history of iliac stenting, left femoral-popliteal bypass graft, right femoral endarterectomy and right SFA stenting.  Ultrasound indicates several areas of in-stent stenosis.  He comes in today for further evaluation and possible intervention.  Procedure:  The patient was identified in the holding area and taken to room 8.  The patient was then placed supine on the table and prepped and draped in the usual sterile fashion.  A time out was called.  Conscious sedation was administered with the use of IV fentanyl  and Versed  under continuous physician and nurse monitoring.  Heart rate, blood pressure, and oxygen  saturation were continuously monitored.  Total sedation time was 46 minutes.  Ultrasound was used to evaluate the right common femoral artery.  It was patent .  A digital ultrasound image was acquired.  A micropuncture needle was used to access the right common femoral artery under ultrasound guidance.  An 018 wire was advanced without resistance and a micropuncture sheath was placed.  The 018 wire was removed and a benson wire was placed.  The micropuncture sheath was exchanged for a 5 french sheath.  An omniflush catheter was advanced over the wire to the level of L-1.  An abdominal angiogram was obtained.  Next, using the omniflush catheter and a benson wire, the aortic bifurcation was  crossed and the catheter was placed into theleft external iliac artery and left runoff was obtained.  right runoff was performed via retrograde sheath injections.  Findings:   Aortogram: No significant renal stenosis was visualized.  The infrarenal abdominal aorta is widely patent.  Stents within the bilateral iliac systems are patent.  However, on the right side there does appear to be in-stent stenosis of at least 60% in the common and external iliac artery.  No significant stenosis was seen on the left.  Right Lower Extremity: The right common femoral and profundofemoral artery are patent.  The superficial femoral arteries patent throughout its course.  The stents in the superficial femoral-popliteal artery are widely patent.  The anterior tibial artery is occluded near its origin but reconstitutes.  The posterior tibial and peroneal artery are patent down to the ankle where the posterior tibial crosses as does the anterior tibial  Left Lower Extremity: The left common femoral profundofemoral artery widely patent.  A femoral-popliteal bypass graft is visualized without evidence of stenosis.  There is been progression of the tibial disease on the left.  Anterior tibial and peroneal with a dominant vessel runoff  Intervention: After the above images were acquired the decision was made to proceed with intervention.  A 6 French sheath was placed.  The patient was given 5000 units of heparin .  I selected a 7 x 80 drug-coated Ranger balloon and performed drug-coated balloon angioplasty of the right common and external iliac artery in-stent stenosis taking the balloon to 10 atm for 3 minutes.  Completion imaging showed resolution of the stenosis.  Catheters and wires  were removed.  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 Malvina New, M.D., [facility] [place] Office: [phone] Pager:  [phone]

## 2024-06-17 NOTE — Interval H&P Note (Signed)
 History and Physical Interval Note:  06/17/2024 10:34 AM  Ryan Pittman  has presented today for surgery, with the diagnosis of mechanical complications of bypass graft.  The various methods of treatment have been discussed with the patient and family. After consideration of risks, benefits and other options for treatment, the patient has consented to  Procedure(s): ABDOMINAL AORTOGRAM (N/A) Lower Extremity Angiography (N/A) LOWER EXTREMITY INTERVENTION (N/A) as a surgical intervention.  The patient's history has been reviewed, patient examined, no change in status, stable for surgery.  I have reviewed the patient's chart and labs.  Questions were answered to the patient's satisfaction.     Malvina New

## 2024-06-18 ENCOUNTER — Encounter (HOSPITAL_COMMUNITY): Payer: Self-pay | Admitting: Surgery

## 2024-08-11 ENCOUNTER — Other Ambulatory Visit (INDEPENDENT_AMBULATORY_CARE_PROVIDER_SITE_OTHER)

## 2024-08-11 DIAGNOSIS — D500 Iron deficiency anemia secondary to blood loss (chronic): Secondary | ICD-10-CM | POA: Diagnosis not present

## 2024-08-11 LAB — CBC WITH DIFFERENTIAL/PLATELET
Basophils Absolute: 0.1 K/uL (ref 0.0–0.1)
Basophils Relative: 1 % (ref 0.0–3.0)
Eosinophils Absolute: 0.1 K/uL (ref 0.0–0.7)
Eosinophils Relative: 1 % (ref 0.0–5.0)
HCT: 37.1 % — ABNORMAL LOW (ref 39.0–52.0)
Hemoglobin: 12.8 g/dL — ABNORMAL LOW (ref 13.0–17.0)
Lymphocytes Relative: 20.2 % (ref 12.0–46.0)
Lymphs Abs: 2.5 K/uL (ref 0.7–4.0)
MCHC: 34.5 g/dL (ref 30.0–36.0)
MCV: 90.5 fl (ref 78.0–100.0)
Monocytes Absolute: 0.9 K/uL (ref 0.1–1.0)
Monocytes Relative: 7.4 % (ref 3.0–12.0)
Neutro Abs: 8.6 K/uL — ABNORMAL HIGH (ref 1.4–7.7)
Neutrophils Relative %: 70.4 % (ref 43.0–77.0)
Platelets: 209 K/uL (ref 150.0–400.0)
RBC: 4.1 Mil/uL — ABNORMAL LOW (ref 4.22–5.81)
RDW: 16.2 % — ABNORMAL HIGH (ref 11.5–15.5)
WBC: 12.1 K/uL — ABNORMAL HIGH (ref 4.0–10.5)

## 2024-08-13 ENCOUNTER — Ambulatory Visit: Payer: Self-pay | Admitting: Family Medicine

## 2024-08-16 ENCOUNTER — Other Ambulatory Visit: Payer: Self-pay | Admitting: Cardiovascular Disease

## 2024-08-21 ENCOUNTER — Other Ambulatory Visit: Payer: Self-pay | Admitting: Cardiovascular Disease

## 2024-09-01 ENCOUNTER — Ambulatory Visit (INDEPENDENT_AMBULATORY_CARE_PROVIDER_SITE_OTHER): Admitting: Podiatry

## 2024-09-01 ENCOUNTER — Ambulatory Visit: Admitting: Nurse Practitioner

## 2024-09-01 ENCOUNTER — Encounter: Payer: Self-pay | Admitting: Podiatry

## 2024-09-01 ENCOUNTER — Other Ambulatory Visit: Payer: Self-pay

## 2024-09-01 ENCOUNTER — Ambulatory Visit

## 2024-09-01 DIAGNOSIS — I70213 Atherosclerosis of native arteries of extremities with intermittent claudication, bilateral legs: Secondary | ICD-10-CM

## 2024-09-01 DIAGNOSIS — M10371 Gout due to renal impairment, right ankle and foot: Secondary | ICD-10-CM | POA: Diagnosis not present

## 2024-09-01 MED ORDER — CLINDAMYCIN HCL 300 MG PO CAPS: 300.0000 mg | ORAL_CAPSULE | Freq: Three times a day (TID) | ORAL | 0 refills | Status: AC

## 2024-09-01 MED ORDER — METHYLPREDNISOLONE 4 MG PO TBPK: ORAL_TABLET | ORAL | 0 refills | Status: DC

## 2024-09-01 NOTE — Progress Notes (Signed)
"  °  Subjective:  Patient ID: Ryan Pittman, male    DOB: Apr 27, 1941,   MRN: [id number]  Chief Complaint  Patient presents with   Foot Pain    It hurts.  Feels like lightening going up in it.  Saturday, I ate a lot of steak that night.  I've had a problem with it hurting in my toes.  I couldn't put weight on it on Wednesday.    83 y.o. male presents for concern of right foot pain that started last Sunday.  He relates the night before he had had some steak and since then has been having more more trouble and by Wednesday cannot put any weight on the foot.  Relates redness swelling and concern for infection on the top of his foot.  He does have a history of gout.. Denies any other pedal complaints. Denies n/v/f/c.   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 Wonda   PVD (peripheral vascular disease)    L leg bypass, R leg stented    Objective:  Physical Exam: Vascular: DP/PT pulses 2/4 bilateral. CFT <3 seconds. Normal hair growth on digits. No edema.  Skin. No lacerations or abrasions bilateral feet.  Musculoskeletal: MMT 5/5 bilateral lower extremities in DF, PF, Inversion and Eversion. Deceased ROM in DF of ankle joint.  Right foot erythema and edema noted to the dorsum of the right foot.  Very tender to the touch on the dorsum of the  foot. Neurological: Sensation intact to light touch.   Assessment:   1. 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 -Discussed treatement options for gouty arthritis and gout education provided. Deferred injection at this time due to slight concern for infection. -Clindamycin sent to the pharmacy x 7 days. -Discussed diet and modifications.  -Rx Medrol  Dosepak -Advised patient to call if symptoms are not improved within 1 week -Patient to return in 3 weeks for re-check/further discussion for long term management of gout or sooner if condition worsens.   Asberry Failing, DPM    "

## 2024-09-03 ENCOUNTER — Ambulatory Visit

## 2024-09-12 ENCOUNTER — Telehealth: Payer: Self-pay | Admitting: Cardiovascular Disease

## 2024-09-12 MED ORDER — CLOPIDOGREL BISULFATE 75 MG PO TABS: 75.0000 mg | ORAL_TABLET | ORAL | 1 refills | Status: DC

## 2024-09-12 NOTE — Telephone Encounter (Signed)
 Prescription sent to preferred pharmacy

## 2024-09-12 NOTE — Telephone Encounter (Signed)
 Looks like pcp has been filling this but we have filled in the past. Ok to fill??

## 2024-09-12 NOTE — Telephone Encounter (Signed)
" °*  STAT* If patient is at the pharmacy, call can be transferred to refill team.   1. Which medications need to be refilled? (please list name of each medication and dose if known) clopidogrel  (PLAVIX ) 75 MG tablet   2. Which pharmacy/location (including street and city if local pharmacy) is medication to be sent to?  [facility] - [place], [place] - [address]    3. Do they need a 30 day or 90 day supply? 90  Patient is out of medication   "

## 2024-09-13 ENCOUNTER — Other Ambulatory Visit: Payer: Self-pay | Admitting: Cardiovascular Disease

## 2024-09-13 MED ORDER — CLOPIDOGREL BISULFATE 75 MG PO TABS: 75.0000 mg | ORAL_TABLET | ORAL | 3 refills | Status: AC

## 2024-09-26 ENCOUNTER — Ambulatory Visit (HOSPITAL_COMMUNITY)
Admission: RE | Admit: 2024-09-26 | Discharge: 2024-09-26 | Disposition: A | Source: Ambulatory Visit | Attending: Surgery | Admitting: Surgery

## 2024-09-26 DIAGNOSIS — I70213 Atherosclerosis of native arteries of extremities with intermittent claudication, bilateral legs: Secondary | ICD-10-CM | POA: Diagnosis present

## 2024-09-26 LAB — VAS US ABI WITH/WO TBI

## 2024-09-28 NOTE — Progress Notes (Unsigned)
 " HISTORY AND PHYSICAL     CC:  follow up. Requesting Provider:  Cleatus Arlyss RAMAN, MD  HPI: This is a 84 y.o. male pt of Dr. Serene who is here today for follow up for PAD.  Pt has extensive vascular surgery hx as follows:  1) Left femoral to below-knee popliteal bypass graft by Dr. Marea 2) 02/13/2020: Right iliofemoral endarterectomy with bovine patch angioplasty  3) 06/08/2020: Drug-coated balloon angioplasty right common and external iliac artery.  Atherectomy with DCB, right superficial femoral artery  4) 10/25/2021: Right superficial femoral artery stent  5) 11/28/2022: Stent right popliteal artery, right external iliac artery, balloon angioplasty right common iliac artery  6) 01/15/2024: Left external iliac artery stenting, DCBA of left femoropopliteal bypass and popliteal artery, balloon angioplasty of right common and external iliac arteries 7) 06/17/2024: drug coated balloon angioplasty right CIA, EIA  Pt was seen 05/28/2024 and at that time, he was referred for angiogram for in stent stenosis.  On 06/17/2024, he underwent drug coated balloon angioplasty right CIA, EIA by Dr. Serene.   The pt returns today for follow up.  ***  The pt is on a statin for cholesterol management.    The pt is not on an aspirin .    Other AC:  Plavix  The pt is on CCB, diuretic, BB for hypertension.  The pt is not on diabetic medication. Tobacco hx:  former  Pt does not have family hx of AAA.  Past Medical History:  Diagnosis Date   Anemia    due to GIB, s/p transfusion   Anginal pain 07/10/2018   Arthritis    back pain, much worse after consecutive golf rounds   Cancer [facility])    thyroid    Chronic kidney disease    Colon polyps    Coronary artery disease    COVID-19    Dyspnea    walking up a hill   Dysrhythmia 2020   GERD (gastroesophageal reflux disease)    Hypercalcemia    h/o, resolved as of 2012, prev due to high amount of calcium intake   Hyperlipidemia    Hypertension     Hypothyroidism    IgG gammopathy    stable as of 2012 per [facility]   Pneumonia    as a child   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 Wonda   PVD (peripheral vascular disease)    L leg bypass, R leg stented    Past Surgical History:  Procedure Laterality Date    dental implant left bottom-did have bleeding      ABDOMINAL AORTOGRAM N/A 01/15/2024   Procedure: ABDOMINAL AORTOGRAM;  Surgeon: Serene Gaile ORN, MD;  [facility]: [facility];  Service: Cardiovascular;  Laterality: N/A;   ABDOMINAL AORTOGRAM N/A 06/17/2024   Procedure: ABDOMINAL AORTOGRAM;  Surgeon: Serene Gaile ORN, MD;  [facility]: [facility];  Service: Cardiovascular;  Laterality: N/A;   ABDOMINAL AORTOGRAM W/LOWER EXTREMITY Bilateral 06/26/2019   Procedure: ABDOMINAL AORTOGRAM W/LOWER EXTREMITY;  Surgeon: Court Dorn PARAS, MD;  [facility];  Service: Cardiovascular;  Laterality: Bilateral;   ABDOMINAL AORTOGRAM W/LOWER EXTREMITY N/A 06/08/2020   Procedure: ABDOMINAL AORTOGRAM W/LOWER EXTREMITY;  Surgeon: Serene Gaile ORN, MD;  [facility];  Service: Cardiovascular;  Laterality: N/A;   ABDOMINAL AORTOGRAM W/LOWER EXTREMITY N/A 11/28/2022   Procedure: ABDOMINAL AORTOGRAM W/LOWER EXTREMITY;  Surgeon: Serene Gaile ORN, MD;  [facility];  Service:  Cardiovascular;  Laterality: N/A;   BYPASS GRAFT     L leg   CARDIAC CATHETERIZATION  06/24/2018   COLONOSCOPY N/A 10/09/2021   Procedure: COLONOSCOPY;  Surgeon: Toledo, Ladell POUR, MD;  Location: [facility] ENDOSCOPY;  Service: Gastroenterology;  Laterality: N/A;   COLONOSCOPY WITH PROPOFOL  N/A 05/04/2015   Procedure: COLONOSCOPY WITH PROPOFOL ;  Surgeon: Gladis RAYMOND Mariner, MD;  Location: [facility] ENDOSCOPY;  Service: Endoscopy;  Laterality: N/A;   CORONARY ARTERY BYPASS GRAFT N/A 07/11/2018   Procedure: CORONARY ARTERY BYPASS GRAFTING (CABG) x three, using left internal mammary  artery and right leg greater saphenous vein harvested endoscopically;  Surgeon: Lucas Dorise POUR, MD;  [facility]: [facility];  Service: Open Heart Surgery;  Laterality: N/A;   ENDARTERECTOMY FEMORAL Right 02/13/2020   Procedure: RIGHT ENDARTERECTOMY FEMORAL WITH BOVINE PATCH;  Surgeon: Serene Gaile ORN, MD;  [facility];  Service: Vascular;  Laterality: Right;   ESOPHAGOGASTRODUODENOSCOPY N/A 10/09/2021   Procedure: ESOPHAGOGASTRODUODENOSCOPY (EGD);  Surgeon: Toledo, Ladell POUR, MD;  Location: [facility] ENDOSCOPY;  Service: Gastroenterology;  Laterality: N/A;   GIVENS CAPSULE STUDY N/A 10/09/2021   Procedure: GIVENS CAPSULE STUDY;  Surgeon: Toledo, Ladell POUR, MD;  Location: [facility] ENDOSCOPY;  Service: Gastroenterology;  Laterality: N/A;   LEFT ATRIAL APPENDAGE OCCLUSION N/A 06/29/2022   Procedure: LEFT ATRIAL APPENDAGE OCCLUSION;  Surgeon: Wonda Sharper, MD;  Location: [facility] INVASIVE CV LAB;  Service: Cardiovascular;  Laterality: N/A;   LEFT HEART CATH AND CORONARY ANGIOGRAPHY N/A 06/24/2018   Procedure: LEFT HEART CATH AND CORONARY ANGIOGRAPHY;  Surgeon: Court Dorn PARAS, MD;  [facility];  Service: Cardiovascular;  Laterality: N/A;   LOWER EXTREMITY ANGIOGRAPHY Bilateral 10/25/2021   Procedure: Lower Extremity Angiography;  Surgeon: Serene Gaile ORN, MD;  [facility];  Service: Cardiovascular;  Laterality: Bilateral;   LOWER EXTREMITY ANGIOGRAPHY N/A 01/15/2024   Procedure: Lower Extremity Angiography;  Surgeon: Serene Gaile ORN, MD;  [facility];  Service: Cardiovascular;  Laterality: N/A;   LOWER EXTREMITY ANGIOGRAPHY N/A 06/17/2024   Procedure: Lower Extremity Angiography;  Surgeon: Serene Gaile ORN, MD;  [facility];  Service: Cardiovascular;  Laterality: N/A;   LOWER EXTREMITY INTERVENTION N/A 01/15/2024   Procedure: LOWER EXTREMITY INTERVENTION;  Surgeon: Serene Gaile ORN, MD;  [facility];  Service: Cardiovascular;   Laterality: N/A;   LOWER EXTREMITY INTERVENTION N/A 06/17/2024   Procedure: LOWER EXTREMITY INTERVENTION;  Surgeon: Serene Gaile ORN, MD;  [facility];  Service: Cardiovascular;  Laterality: N/A;   PERIPHERAL VASCULAR ATHERECTOMY Right 06/08/2020   Procedure: PERIPHERAL VASCULAR ATHERECTOMY;  Surgeon: Serene Gaile ORN, MD;  [facility];  Service: Cardiovascular;  Laterality: Right;  superficial femoral   PERIPHERAL VASCULAR BALLOON ANGIOPLASTY Right 06/08/2020   Procedure: PERIPHERAL VASCULAR BALLOON ANGIOPLASTY;  Surgeon: Serene Gaile ORN, MD;  [facility];  Service: Cardiovascular;  Laterality: Right;  External iliac   PERIPHERAL VASCULAR BALLOON ANGIOPLASTY Right 11/28/2022   Procedure: PERIPHERAL VASCULAR BALLOON ANGIOPLASTY;  Surgeon: Serene Gaile ORN, MD;  [facility];  Service: Cardiovascular;  Laterality: Right;  common illiac   PERIPHERAL VASCULAR INTERVENTION Right 10/25/2021   Procedure: PERIPHERAL VASCULAR INTERVENTION;  Surgeon: Serene Gaile ORN, MD;  [facility];  Service: Cardiovascular;  Laterality: Right;  SFA   PERIPHERAL VASCULAR INTERVENTION Right 11/28/2022   Procedure: PERIPHERAL VASCULAR INTERVENTION;  Surgeon: Serene Gaile ORN, MD;  [facility];  Service: Cardiovascular;  Laterality: Right;   RENAL ANGIOGRAPHY Bilateral 10/25/2021   Procedure: RENAL ANGIOGRAPHY;  Surgeon: Serene Gaile ORN, MD;  [facility];  Service: Cardiovascular;  Laterality: Bilateral;   TEE WITHOUT CARDIOVERSION N/A 07/11/2018   Procedure: TRANSESOPHAGEAL ECHOCARDIOGRAM (TEE);  Surgeon: Lucas Dorise POUR, MD;  Location: [facility] OR;  Service: Open Heart Surgery;  Laterality: N/A;   TEE WITHOUT CARDIOVERSION N/A 06/29/2022   Procedure: TRANSESOPHAGEAL ECHOCARDIOGRAM (TEE);  Surgeon: Wonda Sharper, MD;  Location: [facility] INVASIVE CV LAB;  Service: Cardiovascular;  Laterality: N/A;   TEE WITHOUT  CARDIOVERSION N/A 08/11/2022   Procedure: TRANSESOPHAGEAL ECHOCARDIOGRAM (TEE);  Surgeon: Francyne Headland, MD;  [facility];  Service: Cardiovascular;  Laterality: N/A;   THYROIDECTOMY, PARTIAL  2016   VASCULAR SURGERY      Allergies[1]  Current Outpatient Medications  Medication Sig Dispense Refill   ALPRAZolam  (XANAX ) 0.5 MG tablet Take 0.25 mg by mouth at bedtime as needed for sleep or anxiety.     amLODipine  (NORVASC ) 10 MG tablet TAKE 1 TABLET BY MOUTH DAILY 90 tablet 1   clopidogrel  (PLAVIX ) 75 MG tablet Take 1 tablet (75 mg total) by mouth every Monday, Wednesday, and Friday. 36 tablet 3   Evolocumab  (REPATHA  SURECLICK) 140 MG/ML SOAJ Inject 140 mg into the skin every 14 (fourteen) days. 6 mL 1   hydrochlorothiazide (HYDRODIURIL) 50 MG tablet Take 1 tablet by mouth daily.     levothyroxine  (SYNTHROID ) 75 MCG tablet TAKE 1 TABLET BY MOUTH 6 DAYS A WEEK AND1 & 1/2 TABLETS ONE DAY A WEEK AS DIRECTED. 100 tablet 3   methylPREDNISolone  (MEDROL  DOSEPAK) 4 MG TBPK tablet Take as directed 21 tablet 0   metoprolol  tartrate (LOPRESSOR ) 25 MG tablet TAKE ONE TABLET BY MOUTH TWICE DAILY 90 tablet 3   Multiple Vitamins-Minerals (MULTIVITAMIN MEN 50+) TABS Take 1 tablet by mouth in the morning.     tamsulosin  (FLOMAX ) 0.4 MG CAPS capsule Take 1 capsule (0.4 mg total) by mouth daily. 90 capsule 3   No current facility-administered medications for this visit.    Family History  Problem Relation Age of Onset   Diabetes Mother    Stroke Father    Colon cancer Neg Hx    Prostate cancer Neg Hx     Social History   Socioeconomic History   Marital status: Married    Spouse name: Not on file   Number of children: Not on file   Years of education: Not on file   Highest education level: GED or equivalent  Occupational History   Not on file  Tobacco Use   Smoking status: Former    Current packs/day: 0.00    Types: Cigarettes    Quit date: 09/04/2008    Years since quitting: 16.0     Passive exposure: Never   Smokeless tobacco: Former   Tobacco comments:    HAs quit tobacco products 2009  Vaping Use   Vaping status: Never Used  Substance and Sexual Activity   Alcohol use: Yes    Comment: occ   Drug use: No   Sexual activity: Yes  Other Topics Concern   Not on file  Social History Narrative   Married 50+ years, 2 kids   Runs [employer]   Likes to play golf   Social Drivers of Health   Tobacco Use: Medium Risk (09/01/2024)   Patient History    Smoking Tobacco Use: Former    Smokeless Tobacco Use: Former    Sports Administrator  Exposure: Never  Physicist, Medical Strain: Low Risk (12/27/2023)   Overall Financial Resource Strain (CARDIA)    Difficulty of Paying Living Expenses: Not hard at all  Food Insecurity: No Food Insecurity (12/27/2023)   Hunger Vital Sign    Worried About Running Out of Food in the Last Year: Never true    Ran Out of Food in the Last Year: Never true  Transportation Needs: No Transportation Needs (12/27/2023)   PRAPARE - Administrator, Civil Service (Medical): No    Lack of Transportation (Non-Medical): No  Physical Activity: Insufficiently Active (12/27/2023)   Exercise Vital Sign    Days of Exercise per Week: 3 days    Minutes of Exercise per Session: 40 min  Stress: No Stress Concern Present (12/27/2023)   [employer] of Occupational Health - Occupational Stress Questionnaire    Feeling of Stress : Not at all  Social Connections: Moderately Integrated (12/27/2023)   Social Connection and Isolation Panel    Frequency of Communication with Friends and Family: More than three times a week    Frequency of Social Gatherings with Friends and Family: More than three times a week    Attends Religious Services: More than 4 times per year    Active Member of [employer] or Organizations: No    Attends Banker Meetings: Never    Marital Status: Married  Catering Manager Violence: Not At Risk (12/27/2023)   Humiliation,  Afraid, Rape, and Kick questionnaire    Fear of Current or Ex-Partner: No    Emotionally Abused: No    Physically Abused: No    Sexually Abused: No  Depression (PHQ2-9): Low Risk (01/22/2024)   Depression (PHQ2-9)    PHQ-2 Score: 3  Alcohol Screen: Low Risk (12/27/2023)   Alcohol Screen    Last Alcohol Screening Score (AUDIT): 0  Housing: Low Risk (12/27/2023)   Housing Stability Vital Sign    Unable to Pay for Housing in the Last Year: No    Number of Times Moved in the Last Year: 0    Homeless in the Last Year: No  Utilities: Not At Risk (12/27/2023)   AHC Utilities    Threatened with loss of utilities: No  Health Literacy: Adequate Health Literacy (12/27/2023)   B1300 Health Literacy    Frequency of need for help with medical instructions: Never     REVIEW OF SYSTEMS:  *** [X]  denotes positive finding, [ ]  denotes negative finding Cardiac  Comments:  Chest pain or chest pressure:    Shortness of breath upon exertion:    Short of breath when lying flat:    Irregular heart rhythm:        Vascular    Pain in calf, thigh, or hip brought on by ambulation:    Pain in feet at night that wakes you up from your sleep:     Blood clot in your veins:    Leg swelling:         Pulmonary    Oxygen  at home:    Productive cough:     Wheezing:         Neurologic    Sudden weakness in arms or legs:     Sudden numbness in arms or legs:     Sudden onset of difficulty speaking or slurred speech:    Temporary loss of vision in one eye:     Problems with dizziness:         Gastrointestinal    Blood  in stool:     Vomited blood:         Genitourinary    Burning when urinating:     Blood in urine:        Psychiatric    Major depression:         Hematologic    Bleeding problems:    Problems with blood clotting too easily:        Skin    Rashes or ulcers:        Constitutional    Fever or chills:      PHYSICAL EXAMINATION:  ***  General:  WDWN in NAD; vital signs  documented above Gait: Not observed HENT: WNL, normocephalic Pulmonary: normal non-labored breathing , without wheezing Cardiac: {Desc; regular/irreg:14544} HR, {With/Without:20273} carotid bruit*** Abdomen: soft, NT; aortic pulse is *** palpable Skin: {With/Without:20273} rashes Vascular Exam/Pulses:  Right Left  Radial {Exam; arterial pulse strength 0-4:30167} {Exam; arterial pulse strength 0-4:30167}  Femoral {Exam; arterial pulse strength 0-4:30167} {Exam; arterial pulse strength 0-4:30167}  Popliteal {Exam; arterial pulse strength 0-4:30167} {Exam; arterial pulse strength 0-4:30167}  DP {Exam; arterial pulse strength 0-4:30167} {Exam; arterial pulse strength 0-4:30167}  PT {Exam; arterial pulse strength 0-4:30167} {Exam; arterial pulse strength 0-4:30167}  Peroneal *** ***   Extremities: {With/Without:20273} ischemic changes, {With/Without:20273} Gangrene , {With/Without:20273} cellulitis; {With/Without:20273} open wounds Musculoskeletal: no muscle wasting or atrophy  Neurologic: A&O X 3 Psychiatric:  The pt has {Desc; normal/abnormal:11317::Normal} affect.   Non-Invasive Vascular Imaging:   ABI's/TBI's on 09/26/2024: Right:  [place]/0.39 - Great toe pressure: 58 Left:  [place]/0.5 - Great toe pressure: 75  Arterial duplex on 09/26/2024: Abdominal Aorta Findings:  +-------------+-------+----------+----------+--------+--------+--------+  Location    AP (cm)Trans (cm)PSV (cm/s)WaveformThrombusComments  +-------------+-------+----------+----------+--------+--------+--------+  Mid         1.90   1.60      35                                  +-------------+-------+----------+----------+--------+--------+--------+  Distal      1.90   2.20      69                                  +-------------+-------+----------+----------+--------+--------+--------+  RT CIA Prox  0.9    1.0       70                                   +-------------+-------+----------+----------+--------+--------+--------+  RT CIA Mid                    80                                  +-------------+-------+----------+----------+--------+--------+--------+  RT CIA Distal                 122                                 +-------------+-------+----------+----------+--------+--------+--------+  RT EIA Prox  1.3    0.9       147                                 +-------------+-------+----------+----------+--------+--------+--------+  RT EIA Mid                    115                                 +-------------+-------+----------+----------+--------+--------+--------+  RT EIA Distal                 153                                 +-------------+-------+----------+----------+--------+--------+--------+  LT CIA Prox  0.8    0.8       63                                  +-------------+-------+----------+----------+--------+--------+--------+  LT CIA Mid                    110                                 +-------------+-------+----------+----------+--------+--------+--------+  LT CIA Distal                 107                                 +-------------+-------+----------+----------+--------+--------+--------+  LT EIA Prox  0.8    1.0       90                                  +-------------+-------+----------+----------+--------+--------+--------+  LT EIA Mid                    66                                  +-------------+-------+----------+----------+--------+--------+--------+  LT EIA Distal                 74                                  +-------------+-------+----------+----------+--------+--------+--------+   Visualization of the Proximal Abdominal Aorta, [place] artery, Distal Abdominal Aorta, Right CIA Proximal artery and Left CIA Proximal artery was limited.    +----------+--------+-----+---------------+----------+--------+  RIGHT    PSV cm/sRatioStenosis       Waveform  Comments  +----------+--------+-----+---------------+----------+--------+  CFA Mid   109                         biphasic            +----------+--------+-----+---------------+----------+--------+  DFA      233          50-74% stenosisbiphasic            +----------+--------+-----+---------------+----------+--------+  POP Distal68                          monophasic          +----------+--------+-----+---------------+----------+--------+     Right  Stent(s):  +---------------------------+--------+--------+----------+--------+  SFA prox to pop a mid stentPSV cm/sStenosisWaveform  Comments  +---------------------------+--------+--------+----------+--------+  Prox to Stent              87              biphasic            +---------------------------+--------+--------+----------+--------+  Proximal Stent             66              biphasic            +---------------------------+--------+--------+----------+--------+  Mid Stent                  84              monophasic          +---------------------------+--------+--------+----------+--------+  Distal Stent               59              monophasic          +---------------------------+--------+--------+----------+--------+  Distal to Stent            93              monophasic          +---------------------------+--------+--------+----------+--------+    +-------+--------+-----+---------------+----------+--------+  LEFT  PSV cm/sRatioStenosis       Waveform  Comments  +-------+--------+-----+---------------+----------+--------+  CFA Mid169          30-49% stenosismonophasic          +-------+--------+-----+---------------+----------+--------+     Left Graft #1: femoral- below knee popliteal bypass graft   +--------------------+--------+---------------+----------+--------+                     PSV cm/sStenosis       Waveform  Comments  +--------------------+--------+---------------+----------+--------+  Inflow             169     30-49% stenosismonophasic          +--------------------+--------+---------------+----------+--------+  Proximal Anastomosis70                     biphasic            +--------------------+--------+---------------+----------+--------+  Proximal Graft      57                     biphasic            +--------------------+--------+---------------+----------+--------+  Mid Graft           29                     monophasic          +--------------------+--------+---------------+----------+--------+  Distal Graft        32                     monophasic          +--------------------+--------+---------------+----------+--------+  Distal Anastomosis  43                     monophasic          +--------------------+--------+---------------+----------+--------+  Outflow            77                     monophasic          +--------------------+--------+---------------+----------+--------+  Summary:  Right: 50-74% stenosis is seen in the deep femoral artery. Patent prox superficial femoral artery to mid popliteal artery stent. There is no evidence of in-stent stenosis.   Left: Unable to duplicate previous common femoral artery velocity. Patent femoral-below knee popliteal artery bypass graft.   Previous ABI's/TBI's on 05/27/2024: Right:  [place]/0.23 - Great toe pressure: 31 Left:  [place]/0.3 - Great toe pressure:  40   ASSESSMENT/PLAN:: 84 y.o. male here for follow up for PAD.  Pt has extensive vascular surgery hx as follows:  1) Left femoral to below-knee popliteal bypass graft by Dr. Marea 2) 02/13/2020: Right iliofemoral endarterectomy with bovine patch angioplasty  3) 06/08/2020: Drug-coated balloon angioplasty right common and  external iliac artery.  Atherectomy with DCB, right superficial femoral artery  4) 10/25/2021: Right superficial femoral artery stent  5) 11/28/2022: Stent right popliteal artery, right external iliac artery, balloon angioplasty right common iliac artery  6) 01/15/2024: Left external iliac artery stenting, DCBA of left femoropopliteal bypass and popliteal artery, balloon angioplasty of right common and external iliac arteries 7) 06/17/2024: drug coated balloon angioplasty right CIA, EIA  Pt was seen 05/28/2024 and at that time, he was referred for angiogram for in stent stenosis.  On 06/17/2024, he underwent drug coated balloon angioplasty right CIA, EIA by Dr. Serene.    -*** -continue statin/plavix  -discussed importance of increased walking daily -pt will f/u in *** with ***.   Lucie Apt, [facility] [phone]  Clinic MD:   Gretta    [1]  Allergies Allergen Reactions   Penicillins Itching and Other (See Comments)   Ace Inhibitors Cough   Aspirin  Other (See Comments)    H/o GI bleed   Celebrex [Celecoxib] Other (See Comments)    GI bleed   Lipitor [Atorvastatin] Other (See Comments)    myalgias   "

## 2024-09-29 ENCOUNTER — Telehealth: Payer: Self-pay | Admitting: Vascular Surgery

## 2024-09-29 ENCOUNTER — Ambulatory Visit (HOSPITAL_COMMUNITY)

## 2024-09-29 ENCOUNTER — Ambulatory Visit

## 2024-09-30 ENCOUNTER — Ambulatory Visit

## 2024-10-01 ENCOUNTER — Encounter: Payer: Self-pay | Admitting: Oncology

## 2024-10-01 ENCOUNTER — Ambulatory Visit (INDEPENDENT_AMBULATORY_CARE_PROVIDER_SITE_OTHER): Admitting: Podiatry

## 2024-10-01 ENCOUNTER — Encounter: Payer: Self-pay | Admitting: Podiatry

## 2024-10-01 DIAGNOSIS — M10371 Gout due to renal impairment, right ankle and foot: Secondary | ICD-10-CM

## 2024-10-01 MED ORDER — COLCHICINE 0.6 MG PO TABS: 0.6000 mg | ORAL_TABLET | Freq: Every day | ORAL | 0 refills | Status: AC

## 2024-10-01 MED ORDER — ACETAMINOPHEN ER 650 MG PO TBCR: 650.0000 mg | EXTENDED_RELEASE_TABLET | Freq: Three times a day (TID) | ORAL | 0 refills | Status: AC | PRN

## 2024-10-01 MED ORDER — METHYLPREDNISOLONE 4 MG PO TBPK: ORAL_TABLET | ORAL | 0 refills | Status: AC

## 2024-10-01 NOTE — Progress Notes (Signed)
"  °  Subjective:  Patient ID: Ryan Pittman, male    DOB: 28-Sep-1940,   MRN: [id number]  Chief Complaint  Patient presents with   Gout    It still bothers me but it's a whole lot better than it was.  The right one is the worse.    84 y.o. male presents for follow-up of right foot pain. Relates doing better since taking the medications. Finished antibiotics and steroid pack. Does relate some ache and swelling still. Denies any other pedal complaints. Denies n/v/f/c.   Past Medical History:  Diagnosis Date   Anemia    due to GIB, s/p transfusion   Anginal pain 07/10/2018   Arthritis    back pain, much worse after consecutive golf rounds   Cancer [facility])    thyroid    Chronic kidney disease    Colon polyps    Coronary artery disease    COVID-19    Dyspnea    walking up a hill   Dysrhythmia 2020   GERD (gastroesophageal reflux disease)    Hypercalcemia    h/o, resolved as of 2012, prev due to high amount of calcium intake   Hyperlipidemia    Hypertension    Hypothyroidism    IgG gammopathy    stable as of 2012 per [facility]   Pneumonia    as a child   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 Wonda   PVD (peripheral vascular disease)    L leg bypass, R leg stented    Objective:  Physical Exam: Vascular: DP/PT pulses 2/4 bilateral. CFT <3 seconds. Normal hair growth on digits. No edema.  Skin. No lacerations or abrasions bilateral feet.  Musculoskeletal: MMT 5/5 bilateral lower extremities in DF, PF, Inversion and Eversion. Deceased ROM in DF of ankle joint.  Right foot erythema and edema noted to the dorsum of the right foot. Improved. Minimally tender to dorsum of right foot currently.  Neurological: Sensation intact to light touch.   Assessment:   1. 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 -Discussed treatement  options for gouty arthritis and gout education provided. -Discussed do feel likely gout over infection.  -Discussed diet and modifications.  -Rx Medrol  Dosepak to be used in case of future flare  -Patient requesting additional medicaiton to take when needed. Discussed we can try colchicine  but will be very limited with this due to kidney function.  -Requesting NSAID as well for pain relief as needed advised on kdiney function and tyelnol sent to pharmacy for him.  -Advised patient to call if symptoms are not improved within 1 week -Patient to return as needed for future flare.    Asberry Failing, DPM    "

## 2024-10-06 ENCOUNTER — Encounter: Payer: Self-pay | Admitting: Physician Assistant

## 2024-10-06 ENCOUNTER — Ambulatory Visit: Admitting: Physician Assistant

## 2024-10-06 VITALS — BP 157/74 | HR 67 | Ht 66.0 in | Wt 153.6 lb

## 2024-10-06 DIAGNOSIS — I70213 Atherosclerosis of native arteries of extremities with intermittent claudication, bilateral legs: Secondary | ICD-10-CM

## 2024-10-06 DIAGNOSIS — I739 Peripheral vascular disease, unspecified: Secondary | ICD-10-CM | POA: Diagnosis not present

## 2024-10-07 ENCOUNTER — Other Ambulatory Visit: Payer: Self-pay | Admitting: *Deleted

## 2024-10-07 DIAGNOSIS — T82398A Other mechanical complication of other vascular grafts, initial encounter: Secondary | ICD-10-CM

## 2024-10-07 DIAGNOSIS — I70213 Atherosclerosis of native arteries of extremities with intermittent claudication, bilateral legs: Secondary | ICD-10-CM

## 2024-10-07 DIAGNOSIS — I739 Peripheral vascular disease, unspecified: Secondary | ICD-10-CM

## 2024-12-30 ENCOUNTER — Ambulatory Visit

## 2025-01-21 ENCOUNTER — Ambulatory Visit: Admitting: Urology

## 2025-04-06 ENCOUNTER — Ambulatory Visit (HOSPITAL_COMMUNITY)

## 2025-04-06 ENCOUNTER — Ambulatory Visit
